# Patient Record
Sex: Male | Born: 1950 | Race: White | Hispanic: No | Marital: Married | Smoking: Never smoker
Health system: Southern US, Community
[De-identification: ages and names within clinical notes are randomized; demographics above are authoritative.]

## PROBLEM LIST (undated history)

## (undated) DIAGNOSIS — Z87442 Personal history of urinary calculi: Secondary | ICD-10-CM

## (undated) DIAGNOSIS — Z9289 Personal history of other medical treatment: Secondary | ICD-10-CM

## (undated) DIAGNOSIS — F419 Anxiety disorder, unspecified: Secondary | ICD-10-CM

## (undated) DIAGNOSIS — K2970 Gastritis, unspecified, without bleeding: Secondary | ICD-10-CM

## (undated) DIAGNOSIS — E785 Hyperlipidemia, unspecified: Secondary | ICD-10-CM

## (undated) DIAGNOSIS — I10 Essential (primary) hypertension: Secondary | ICD-10-CM

## (undated) DIAGNOSIS — K922 Gastrointestinal hemorrhage, unspecified: Secondary | ICD-10-CM

## (undated) DIAGNOSIS — K5732 Diverticulitis of large intestine without perforation or abscess without bleeding: Secondary | ICD-10-CM

## (undated) DIAGNOSIS — K219 Gastro-esophageal reflux disease without esophagitis: Secondary | ICD-10-CM

## (undated) DIAGNOSIS — Z8719 Personal history of other diseases of the digestive system: Secondary | ICD-10-CM

## (undated) DIAGNOSIS — N400 Enlarged prostate without lower urinary tract symptoms: Secondary | ICD-10-CM

## (undated) DIAGNOSIS — C4359 Malignant melanoma of other part of trunk: Secondary | ICD-10-CM

## (undated) DIAGNOSIS — D649 Anemia, unspecified: Secondary | ICD-10-CM

## (undated) DIAGNOSIS — T39395A Adverse effect of other nonsteroidal anti-inflammatory drugs [NSAID], initial encounter: Secondary | ICD-10-CM

## (undated) HISTORY — PX: COLON SURGERY: SHX602

## (undated) HISTORY — DX: Hyperlipidemia, unspecified: E78.5

## (undated) HISTORY — DX: Anemia, unspecified: D64.9

## (undated) HISTORY — PX: HEMORRHOID BANDING: SHX5850

## (undated) HISTORY — PX: HERNIA REPAIR: SHX51

---

## 1969-03-29 HISTORY — PX: KNEE CARTILAGE SURGERY: SHX688

## 1999-03-30 HISTORY — PX: MELANOMA EXCISION: SHX5266

## 2000-11-25 ENCOUNTER — Ambulatory Visit (HOSPITAL_COMMUNITY): Admission: RE | Admit: 2000-11-25 | Discharge: 2000-11-25 | Payer: Self-pay | Admitting: Oncology

## 2000-11-25 ENCOUNTER — Encounter: Payer: Self-pay | Admitting: Oncology

## 2001-09-25 ENCOUNTER — Ambulatory Visit (HOSPITAL_COMMUNITY): Admission: RE | Admit: 2001-09-25 | Discharge: 2001-09-25 | Payer: Self-pay | Admitting: Oncology

## 2001-09-25 ENCOUNTER — Encounter: Payer: Self-pay | Admitting: Oncology

## 2002-10-23 ENCOUNTER — Encounter: Payer: Self-pay | Admitting: Oncology

## 2002-10-23 ENCOUNTER — Ambulatory Visit (HOSPITAL_COMMUNITY): Admission: RE | Admit: 2002-10-23 | Discharge: 2002-10-23 | Payer: Self-pay | Admitting: Oncology

## 2004-01-29 ENCOUNTER — Encounter: Admission: RE | Admit: 2004-01-29 | Discharge: 2004-01-29 | Payer: Self-pay | Admitting: Family Medicine

## 2004-03-03 ENCOUNTER — Ambulatory Visit: Payer: Self-pay | Admitting: Oncology

## 2004-09-02 ENCOUNTER — Ambulatory Visit: Payer: Self-pay | Admitting: Oncology

## 2005-03-01 ENCOUNTER — Ambulatory Visit (HOSPITAL_COMMUNITY): Admission: RE | Admit: 2005-03-01 | Discharge: 2005-03-01 | Payer: Self-pay | Admitting: Oncology

## 2005-03-03 ENCOUNTER — Ambulatory Visit: Payer: Self-pay | Admitting: Oncology

## 2005-03-09 ENCOUNTER — Ambulatory Visit (HOSPITAL_COMMUNITY): Admission: RE | Admit: 2005-03-09 | Discharge: 2005-03-09 | Payer: Self-pay | Admitting: Oncology

## 2005-04-26 ENCOUNTER — Ambulatory Visit: Payer: Self-pay | Admitting: Oncology

## 2005-07-23 ENCOUNTER — Ambulatory Visit: Payer: Self-pay | Admitting: Oncology

## 2005-10-11 ENCOUNTER — Ambulatory Visit: Payer: Self-pay | Admitting: Oncology

## 2007-03-30 HISTORY — PX: TUMOR EXCISION: SHX421

## 2008-02-21 ENCOUNTER — Ambulatory Visit: Payer: Self-pay | Admitting: Oncology

## 2008-03-12 ENCOUNTER — Ambulatory Visit (HOSPITAL_COMMUNITY): Admission: RE | Admit: 2008-03-12 | Discharge: 2008-03-12 | Payer: Self-pay | Admitting: Oncology

## 2008-05-22 ENCOUNTER — Ambulatory Visit: Payer: Self-pay | Admitting: Oncology

## 2009-06-21 ENCOUNTER — Observation Stay (HOSPITAL_COMMUNITY): Admission: EM | Admit: 2009-06-21 | Discharge: 2009-06-22 | Payer: Self-pay | Admitting: Emergency Medicine

## 2009-06-22 ENCOUNTER — Encounter (INDEPENDENT_AMBULATORY_CARE_PROVIDER_SITE_OTHER): Payer: Self-pay | Admitting: Internal Medicine

## 2009-12-03 ENCOUNTER — Emergency Department (HOSPITAL_COMMUNITY): Admission: EM | Admit: 2009-12-03 | Discharge: 2009-12-03 | Payer: Self-pay | Admitting: Emergency Medicine

## 2010-03-18 ENCOUNTER — Ambulatory Visit: Payer: Self-pay | Admitting: Oncology

## 2010-04-17 ENCOUNTER — Ambulatory Visit: Payer: Self-pay | Admitting: Oncology

## 2010-06-11 LAB — POCT URINALYSIS DIPSTICK
Bilirubin Urine: NEGATIVE
Glucose, UA: NEGATIVE mg/dL
Ketones, ur: NEGATIVE mg/dL
Protein, ur: NEGATIVE mg/dL
Specific Gravity, Urine: 1.025 (ref 1.005–1.030)
Urobilinogen, UA: 0.2 mg/dL (ref 0.0–1.0)

## 2010-06-22 LAB — CBC
HCT: 26.8 % — ABNORMAL LOW (ref 39.0–52.0)
Hemoglobin: 8.9 g/dL — ABNORMAL LOW (ref 13.0–17.0)
MCHC: 31.2 g/dL (ref 30.0–36.0)
MCHC: 32.2 g/dL (ref 30.0–36.0)
MCV: 69.2 fL — ABNORMAL LOW (ref 78.0–100.0)
MCV: 72.8 fL — ABNORMAL LOW (ref 78.0–100.0)
Platelets: 201 10*3/uL (ref 150–400)
Platelets: 276 10*3/uL (ref 150–400)
RBC: 3.81 MIL/uL — ABNORMAL LOW (ref 4.22–5.81)
RBC: 3.88 MIL/uL — ABNORMAL LOW (ref 4.22–5.81)
RDW: 18.5 % — ABNORMAL HIGH (ref 11.5–15.5)
RDW: 20.7 % — ABNORMAL HIGH (ref 11.5–15.5)
WBC: 6.3 10*3/uL (ref 4.0–10.5)

## 2010-06-22 LAB — BASIC METABOLIC PANEL
CO2: 26 mEq/L (ref 19–32)
Calcium: 8 mg/dL — ABNORMAL LOW (ref 8.4–10.5)
Chloride: 109 mEq/L (ref 96–112)
Creatinine, Ser: 1.08 mg/dL (ref 0.4–1.5)
GFR calc Af Amer: >60 mL/min (ref >60)
GFR calc non Af Amer: >60 mL/min (ref >60)
Glucose, Bld: 109 mg/dL — ABNORMAL HIGH (ref 70–99)
Potassium: 4.2 mEq/L (ref 3.5–5.1)
Sodium: 139 mEq/L (ref 135–145)

## 2010-06-22 LAB — IRON AND TIBC
TIBC: 598 ug/dL — ABNORMAL HIGH (ref 215–435)
UIBC: 580 ug/dL

## 2010-06-22 LAB — POCT I-STAT, CHEM 8
BUN: 24 mg/dL — ABNORMAL HIGH (ref 6–23)
Calcium, Ion: 1.15 mmol/L (ref 1.12–1.32)
Chloride: 107 mEq/L (ref 96–112)
Creatinine, Ser: 1.1 mg/dL (ref 0.4–1.5)
Glucose, Bld: 98 mg/dL (ref 70–99)
Hemoglobin: 9.5 g/dL — ABNORMAL LOW (ref 13.0–17.0)
Potassium: 4.2 mEq/L (ref 3.5–5.1)
Sodium: 140 mEq/L (ref 135–145)
TCO2: 26 mmol/L (ref 0–100)

## 2010-06-22 LAB — CROSSMATCH
ABO/RH(D): A POS
Antibody Screen: NEGATIVE

## 2010-06-22 LAB — CK TOTAL AND CKMB (NOT AT ARMC)
CK, MB: 1.4 ng/mL (ref 0.3–4.0)
Total CK: 252 U/L — ABNORMAL HIGH (ref 7–232)

## 2010-06-22 LAB — POCT CARDIAC MARKERS
CKMB, poc: <1 ng/mL — ABNORMAL LOW (ref 1.0–8.0)
Myoglobin, poc: 70.6 ng/mL (ref 12–200)
Troponin i, poc: <0.05 ng/mL (ref 0.00–0.09)

## 2010-06-22 LAB — CARDIAC PANEL(CRET KIN+CKTOT+MB+TROPI)
CK, MB: 1.1 ng/mL (ref 0.3–4.0)
Relative Index: 0.5 (ref 0.0–2.5)
Relative Index: 0.6 (ref 0.0–2.5)
Troponin I: <0.01 ng/mL (ref 0.00–0.06)
Troponin I: <0.01 ng/mL (ref 0.00–0.06)

## 2010-06-22 LAB — FERRITIN: Ferritin: 3 ng/mL — ABNORMAL LOW (ref 22–322)

## 2010-06-22 LAB — APTT: aPTT: 27 seconds (ref 24–37)

## 2010-06-22 LAB — MAGNESIUM: Magnesium: 2 mg/dL (ref 1.5–2.5)

## 2010-06-22 LAB — HEMOGLOBIN AND HEMATOCRIT, BLOOD
HCT: 30.5 % — ABNORMAL LOW (ref 39.0–52.0)
Hemoglobin: 9.8 g/dL — ABNORMAL LOW (ref 13.0–17.0)

## 2010-06-22 LAB — PROTIME-INR
INR: 1.03 (ref 0.00–1.49)
Prothrombin Time: 13.4 seconds (ref 11.6–15.2)

## 2010-06-22 LAB — TROPONIN I: Troponin I: 0.01 ng/mL (ref 0.00–0.06)

## 2010-06-22 LAB — ABO/RH: ABO/RH(D): A POS

## 2010-08-10 ENCOUNTER — Encounter (INDEPENDENT_AMBULATORY_CARE_PROVIDER_SITE_OTHER): Payer: Self-pay | Admitting: General Surgery

## 2011-03-29 ENCOUNTER — Telehealth: Payer: Self-pay | Admitting: Oncology

## 2011-03-29 NOTE — Telephone Encounter (Signed)
called pt and informed him of appt on 05/07/2011

## 2011-05-07 ENCOUNTER — Ambulatory Visit (HOSPITAL_BASED_OUTPATIENT_CLINIC_OR_DEPARTMENT_OTHER): Payer: BC Managed Care – PPO | Admitting: Oncology

## 2011-05-07 ENCOUNTER — Telehealth: Payer: Self-pay | Admitting: Oncology

## 2011-05-07 VITALS — BP 135/95 | HR 82 | Temp 98.7°F | Wt 176.3 lb

## 2011-05-07 DIAGNOSIS — C4359 Malignant melanoma of other part of trunk: Secondary | ICD-10-CM

## 2011-05-07 DIAGNOSIS — C439 Malignant melanoma of skin, unspecified: Secondary | ICD-10-CM

## 2011-05-07 NOTE — Progress Notes (Signed)
OFFICE PROGRESS NOTE   INTERVAL HISTORY:   He returns as scheduled. He feels well. He reports intermittent discomfort at the lower back and left buttock area.  Objective:  Vital signs in last 24 hours:  Blood pressure 135/95, pulse 82, temperature 98.7 F (37.1 C), temperature source Oral, weight 176 lb 4.8 oz (79.969 kg).    HEENT: Neck without mass Lymphatics: No cervical, supraclavicular, axillary, or inguinal nodes Resp: Lungs clear bilaterally Cardio: Regular rate and rhythm GI: No hepatomegaly Vascular: No leg edema  Skin: Left upper back scar without evidence of recurrent tumor. Multiple benign appearing moles over the trunk.       Medications: I have reviewed the patient's current medications.  Assessment/Plan: 1. 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 2. Excision of a lipoma near the surgical scar at the left upper back in July 2007. 3.  Diagnosis of "anemia "in 2011, status post an evaluation by Drs.Miguel [name] and Miguel [name]  Disposition:  He remains in clinical remission from the melanoma. He will schedule a dermatology followup with Dr. Margo [name]. Miguel [name] would like to continue followup at the cancer Center. He will return for an office visit in one year.   Miguel Shutters, MD  05/07/2011  9:46 PM

## 2011-05-07 NOTE — Telephone Encounter (Signed)
called pt and scheduled appt for 05/05/2012

## 2012-05-05 ENCOUNTER — Telehealth: Payer: Self-pay | Admitting: Oncology

## 2012-05-05 ENCOUNTER — Ambulatory Visit (HOSPITAL_BASED_OUTPATIENT_CLINIC_OR_DEPARTMENT_OTHER): Payer: 59 | Admitting: Oncology

## 2012-05-05 VITALS — BP 152/93 | HR 92 | Temp 98.1°F | Resp 20 | Wt 166.3 lb

## 2012-05-05 DIAGNOSIS — C4359 Malignant melanoma of other part of trunk: Secondary | ICD-10-CM

## 2012-05-05 DIAGNOSIS — C439 Malignant melanoma of skin, unspecified: Secondary | ICD-10-CM

## 2012-05-05 NOTE — Telephone Encounter (Signed)
gv and printed appt schedule for pt for Jan 2015 °

## 2012-05-05 NOTE — Progress Notes (Signed)
   [facility]    OFFICE PROGRESS NOTE   INTERVAL HISTORY:   He returns as scheduled. He complains of pain and stiffness in the hands. He states the hands are difficult to open in the mornings. He reports an intentional weight loss with a change in his diet. He has "sweats "with moisture on his T-shirt several nights per week. Miguel [name] has not seen the dermatologist in the past year.  Objective:  Vital signs in last 24 hours:  Blood pressure 152/93, pulse 92, temperature 98.1 F (36.7 C), temperature source Oral, resp. rate 20, weight 166 lb 4.8 oz (75.433 kg).    HEENT: Neck without mass Lymphatics: No cervical, supraclavicular, axillary, or inguinal nodes Resp: Lungs clear bilateral Cardio: Regular rate and rhythm GI: No hepatosplenomegaly Vascular: No leg edema  Skin: Left upper back scar without evidence of recurrent tumor. Multiple benign appearing moles over the trunk Musculoskeletal: Arthritic changes at the hand joints bilaterally    Medications: I have reviewed the patient's current medications.  Assessment/Plan: 1. 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 2. Excision of a lipoma near the surgical scar at the left upper back in July 2007.       3.   Diagnosis of "anemia "in 2011, status post an evaluation by Drs.Buccini and Mitchell        4.   weight loss-intentional      Disposition:  He remains in clinical remission from melanoma. I recommended he schedule a followup appointment with Dr. Margo Aye. The discomfort/stiffness at the hands is likely related to arthritis. He will followup with Dr. Clovis Riley for hypertension and if the "sweats "progress.  Miguel [name] would like to continue followup at the cancer Center. He will return for an office visit in one year.   Miguel Papas, MD  05/05/2012  11:10 AM

## 2013-04-16 ENCOUNTER — Telehealth: Payer: Self-pay | Admitting: Oncology

## 2013-04-16 NOTE — Telephone Encounter (Signed)
s.w. pt and advised on 2.9 appt moved to 2.24 per MD request on [phone] pof

## 2013-05-07 ENCOUNTER — Ambulatory Visit: Payer: 59 | Admitting: Oncology

## 2013-05-22 ENCOUNTER — Ambulatory Visit: Payer: 59 | Admitting: Oncology

## 2013-11-03 ENCOUNTER — Encounter (HOSPITAL_COMMUNITY): Payer: Self-pay | Admitting: Emergency Medicine

## 2013-11-03 ENCOUNTER — Emergency Department (HOSPITAL_COMMUNITY): Payer: 59

## 2013-11-03 ENCOUNTER — Emergency Department (HOSPITAL_COMMUNITY)
Admission: EM | Admit: 2013-11-03 | Discharge: 2013-11-03 | Disposition: A | Discharge status: Home / Self Care | Payer: 59 | Attending: Emergency Medicine | Admitting: Emergency Medicine

## 2013-11-03 DIAGNOSIS — Z79899 Other long term (current) drug therapy: Secondary | ICD-10-CM | POA: Insufficient documentation

## 2013-11-03 DIAGNOSIS — E785 Hyperlipidemia, unspecified: Secondary | ICD-10-CM | POA: Insufficient documentation

## 2013-11-03 DIAGNOSIS — R109 Unspecified abdominal pain: Secondary | ICD-10-CM | POA: Insufficient documentation

## 2013-11-03 DIAGNOSIS — Z85820 Personal history of malignant melanoma of skin: Secondary | ICD-10-CM | POA: Insufficient documentation

## 2013-11-03 DIAGNOSIS — N189 Chronic kidney disease, unspecified: Secondary | ICD-10-CM | POA: Insufficient documentation

## 2013-11-03 DIAGNOSIS — D649 Anemia, unspecified: Secondary | ICD-10-CM | POA: Insufficient documentation

## 2013-11-03 DIAGNOSIS — N200 Calculus of kidney: Secondary | ICD-10-CM | POA: Insufficient documentation

## 2013-11-03 DIAGNOSIS — Z7982 Long term (current) use of aspirin: Secondary | ICD-10-CM | POA: Insufficient documentation

## 2013-11-03 LAB — BASIC METABOLIC PANEL
Anion gap: 13 (ref 5–15)
BUN: 16 mg/dL (ref 6–23)
CHLORIDE: 106 meq/L (ref 96–112)
CO2: 24 meq/L (ref 19–32)
Calcium: 8.2 mg/dL — ABNORMAL LOW (ref 8.4–10.5)
Creatinine, Ser: 1.02 mg/dL (ref 0.50–1.35)
GFR calc Af Amer: 88 mL/min — ABNORMAL LOW (ref >90)
GFR, EST NON AFRICAN AMERICAN: 76 mL/min — AB (ref >90)
GLUCOSE: 86 mg/dL (ref 70–99)
POTASSIUM: 3.9 meq/L (ref 3.7–5.3)
Sodium: 143 mEq/L (ref 137–147)

## 2013-11-03 LAB — URINALYSIS, ROUTINE W REFLEX MICROSCOPIC
BILIRUBIN URINE: NEGATIVE
Glucose, UA: NEGATIVE mg/dL
Hgb urine dipstick: NEGATIVE
KETONES UR: NEGATIVE mg/dL
Leukocytes, UA: NEGATIVE
Nitrite: NEGATIVE
PH: 6 (ref 5.0–8.0)
PROTEIN: NEGATIVE mg/dL
Specific Gravity, Urine: 1.008 (ref 1.005–1.030)
Urobilinogen, UA: 0.2 mg/dL (ref 0.0–1.0)

## 2013-11-03 LAB — CBC WITH DIFFERENTIAL/PLATELET
Basophils Absolute: 0 10*3/uL (ref 0.0–0.1)
Basophils Relative: 0 % (ref 0–1)
Eosinophils Absolute: 0.5 10*3/uL (ref 0.0–0.7)
Eosinophils Relative: 7 % — ABNORMAL HIGH (ref 0–5)
HCT: 36.6 % — ABNORMAL LOW (ref 39.0–52.0)
HEMOGLOBIN: 12.4 g/dL — AB (ref 13.0–17.0)
LYMPHS ABS: 1.6 10*3/uL (ref 0.7–4.0)
LYMPHS PCT: 23 % (ref 12–46)
MCH: 30.8 pg (ref 26.0–34.0)
MCHC: 33.9 g/dL (ref 30.0–36.0)
MCV: 91 fL (ref 78.0–100.0)
MONO ABS: 0.7 10*3/uL (ref 0.1–1.0)
Monocytes Relative: 9 % (ref 3–12)
Neutro Abs: 4.5 10*3/uL (ref 1.7–7.7)
Neutrophils Relative %: 61 % (ref 43–77)
Platelets: 202 10*3/uL (ref 150–400)
RBC: 4.02 MIL/uL — AB (ref 4.22–5.81)
RDW: 12.9 % (ref 11.5–15.5)
WBC: 7.3 10*3/uL (ref 4.0–10.5)

## 2013-11-03 MED ORDER — IOHEXOL 350 MG/ML SOLN
100.0000 mL | Freq: Once | INTRAVENOUS | Status: AC | PRN
  Administered 2013-11-03: 100 mL via INTRAVENOUS

## 2013-11-03 MED ORDER — SODIUM CHLORIDE 0.9 % IV BOLUS (SEPSIS)
500.0000 mL | Freq: Once | INTRAVENOUS | Status: AC
  Administered 2013-11-03: 500 mL via INTRAVENOUS

## 2013-11-03 MED ORDER — MORPHINE SULFATE 4 MG/ML IJ SOLN
6.0000 mg | Freq: Once | INTRAMUSCULAR | Status: AC
Start: 2013-11-03 — End: 2013-11-03
  Administered 2013-11-03: 6 mg via INTRAVENOUS
  Filled 2013-11-03: qty 2

## 2013-11-03 MED ORDER — HYDROCODONE-ACETAMINOPHEN 5-325 MG PO TABS: 1.0000 | ORAL_TABLET | ORAL | Status: DC | PRN

## 2013-11-03 NOTE — Discharge Instructions (Signed)
If you were given medicines take as directed.  If you are on coumadin or contraceptives realize their levels and effectiveness is altered by many different medicines.  If you have any reaction (rash, tongues swelling, other) to the medicines stop taking and see a physician.   Please follow up as directed and return to the ER or see a physician for new or worsening symptoms.  Thank you. Filed Vitals:   11/03/13 0617 11/03/13 0730 11/03/13 0916 11/03/13 0917  BP: 169/116 134/90 126/90 126/90  Pulse: 84 66  62  Temp: 97.7 F (36.5 C)     TempSrc: Oral     Resp: 18   18  SpO2: 98% 97%  97%

## 2013-11-03 NOTE — ED Provider Notes (Signed)
CSN: [id number]     Arrival date & time 11/03/13  9622 History   First MD Initiated Contact with Patient 11/03/13 0703     Chief Complaint  Patient presents with  . Flank Pain     (Consider location/radiation/quality/duration/timing/severity/associated sxs/prior Treatment) HPI Comments: 63 year old male with history of kidney stones, melanoma to the left upper back excised in the past with mild lymph node extension, chronic kidney disease, lipids presents with left flank discomfort gradually worsening for the past week. Overall similar previous multiple kidney stone history however he has not had a kidney stone for years now. No blood in the urine or urinary symptoms. No fevers chills or vomiting.  Patient is a 63 y.o. male presenting with flank pain. The history is provided by the patient.  Flank Pain This is a recurrent problem. Pertinent negatives include no chest pain, no abdominal pain, no headaches and no shortness of breath.    Past Medical History  Diagnosis Date  . Anemia   . Cancer   . Chronic kidney disease     KIDNEY STONE  . Hyperlipidemia    Past Surgical History  Procedure Laterality Date  . Knee surgery  1971  . Skin cancer removed  2001/2009   Family History  Problem Relation Age of Onset  . Stroke Mother   . Stroke Brother   . Heart disease Brother    History  Substance Use Topics  . Smoking status: Never Smoker   . Smokeless tobacco: Not on file  . Alcohol Use: No    Review of Systems  Constitutional: Positive for appetite change. Negative for fever and chills.  HENT: Negative for congestion.   Eyes: Negative for visual disturbance.  Respiratory: Negative for shortness of breath.   Cardiovascular: Negative for chest pain.  Gastrointestinal: Negative for vomiting and abdominal pain.  Genitourinary: Positive for flank pain. Negative for dysuria.  Musculoskeletal: Negative for back pain, neck pain and neck stiffness.  Skin: Negative for rash.   Neurological: Negative for light-headedness and headaches.      Allergies  Review of patient's allergies indicates no known allergies.  Home Medications   Prior to Admission medications   Medication Sig Start Date End Date Taking? Authorizing Provider  acetaminophen (TYLENOL) 500 MG tablet Take 1,000 mg by mouth every 6 (six) hours as needed (pain).   Yes Historical Provider, MD  aspirin EC 81 MG tablet Take 81 mg by mouth daily.   Yes Historical Provider, MD  celecoxib (CELEBREX) 100 MG capsule Take 100 mg by mouth 2 (two) times daily as needed.   Yes Historical Provider, MD  Multiple Vitamins-Minerals (CENTRUM SILVER PO) Take by mouth.     Yes Historical Provider, MD  Omega-3 Fatty Acids (FISH OIL) 1200 MG CAPS Take by mouth.     Yes Historical Provider, MD  Red Yeast Rice Extract (RED YEAST RICE PO) Take 2 tablets by mouth 2 (two) times daily.   Yes Historical Provider, MD  zolpidem (AMBIEN) 10 MG tablet Take 10 mg by mouth at bedtime as needed.     Yes Historical Provider, MD   BP 169/116  Pulse 84  Temp(Src) 97.7 F (36.5 C) (Oral)  Resp 18  SpO2 98% Physical Exam  Nursing note and vitals reviewed. Constitutional: He is oriented to person, place, and time. He appears well-developed and well-nourished.  HENT:  Head: Normocephalic and atraumatic.  Mild dry mucous membranes  Eyes: Conjunctivae are normal. Right eye exhibits no discharge. Left eye exhibits no  discharge.  Neck: Normal range of motion. Neck supple. No tracheal deviation present.  Cardiovascular: Normal rate and regular rhythm.   Pulmonary/Chest: Effort normal and breath sounds normal.  Abdominal: Soft. He exhibits no distension. There is no tenderness. There is no guarding.  Musculoskeletal: He exhibits tenderness. He exhibits no edema.  Mild tenderness left mid and lower flank posteriorly, no rash or signs of melanoma externally that I can visualize.  Neurological: He is alert and oriented to person, place,  and time.  Skin: Skin is warm. No rash noted.  Psychiatric: He has a normal mood and affect.    ED Course  Procedures (including critical care time) Emergency Focused Ultrasound Exam Limited retroperitoneal ultrasound of kidneys  Performed and interpreted by Dr. Reather Converse Indication: flank pain Focused abdominal ultrasound with both kidneys imaged in transverse and longitudinal planes in real-time. Interpretation: no hydronephrosis visualized.   Images archived electronically  Labs Review Labs Reviewed  BASIC METABOLIC PANEL - Abnormal; Notable for the following:    Calcium 8.2 (*)    GFR calc non Af Amer 76 (*)    GFR calc Af Amer 88 (*)    All other components within normal limits  CBC WITH DIFFERENTIAL - Abnormal; Notable for the following:    RBC 4.02 (*)    Hemoglobin 12.4 (*)    HCT 36.6 (*)    Eosinophils Relative 7 (*)    All other components within normal limits  URINALYSIS, ROUTINE W REFLEX MICROSCOPIC    Imaging Review Ct Angio Chest Aortic Dissect W &/or W/o  11/03/2013   CLINICAL DATA:  Left-sided flank pain.  Unremarkable unenhanced CT.  EXAM: CT ANGIOGRAPHY CHEST, ABDOMEN AND PELVIS  TECHNIQUE: Multidetector CT imaging through the chest, abdomen and pelvis was performed using the standard protocol during bolus administration of intravenous contrast. Multiplanar reconstructed images and MIPs were obtained and reviewed to evaluate the vascular anatomy.  CONTRAST:  160mL OMNIPAQUE IOHEXOL 350 MG/ML SOLN  COMPARISON:  CT of the abdomen and pelvis without contrast earlier today as well as prior contrast enhanced CT's of the chest, abdomen and pelvis on 03/09/2005.  FINDINGS: CTA CHEST FINDINGS  The thoracic aorta is of normal caliber and shows normal patency. Proximal great vessels are widely patent. No evidence of aortic dissection. Pulmonary arteries are also well opacified and show normal patency.  Large hiatal hernia present. No evidence of masses or enlarged lymph nodes.  Lung windows show no evidence of infiltrates or pulmonary nodules. No pleural or pericardial fluid. The heart size is normal. No pneumothorax.  Review of the MIP images confirms the above findings.  CTA ABDOMEN AND PELVIS FINDINGS  The abdominal aorta shows normal patency and no evidence of aneurysm or dissection. Mild narrowing of the proximal celiac axis in a configuration consistent with mild median arcuate ligament compression. The superior and inferior mesenteric arteries are normally patent. Two separate right renal arteries and a single left renal artery show normal patency. No distal renal artery branch pathology is identified.  Bilateral iliac and common femoral arteries show normal patency.  Nonvascular evaluation shows no arterial phase solid organ abnormalities. No masses or enlarged lymph nodes are seen. Bowel loops are unremarkable. There is a small umbilical hernia containing fat. No abnormal fluid collections.  The prostate gland is moderately enlarged. Degenerative disc disease present at L5-S1. No bony lesions.  Review of the MIP images confirms the above findings.  IMPRESSION: No evidence of aortic or branch vessel pathology in the chest, abdomen  or pelvis.   Electronically Signed   By: Aletta Edouard M.D.   On: 11/03/2013 10:55   Ct Renal Stone Study  11/03/2013   CLINICAL DATA:  Left-sided flank pain and history of renal calculi.  EXAM: CT RENAL STONE PROTOCOL  TECHNIQUE: Multidetector CT imaging of the abdomen and pelvis was performed following the standard protocol without intravenous contrast  COMPARISON:  03/09/2005  FINDINGS: Stable large hiatal hernia. 5 mm nonobstructing calculus is identified in the lower pole collecting system of the left kidney. No left-sided ureteral calculi are identified.  Tiny interpolar and lower pole calculi of the right kidney are identified in the 1-2 mm diameter range. These are not causing obstruction. As the ureter is followed, there is suggestion of a 2  mm calculus near the right ureterovesical junction which may actually be within the bladder. Additional calcification just posterior to the right side of the bladder measures roughly 7 mm in greatest diameter and appears to represent 2 adjacent calcifications in the coronal projection. The coronal projection suggests that this represents a distal ureterocele/ureteral diverticulum with focal dilatation of the distal ureter containing calculi.  Unenhanced appearance of the liver, gallbladder, pancreas, spleen, adrenal glands and bowel are unremarkable. There is diffuse diverticulosis of most of the colon. No acute inflammatory process, abnormal fluid collection, mass or enlarged lymph nodes are seen. No evidence of hernia. Bony structures show mild spondylosis of the lumbar spine.  IMPRESSION: No hydronephrosis. Small nonobstructing calculus is present in the lower pole collecting system of the left kidney. Tiny nonobstructing calculi are present in the right kidney. In addition, there is focal dilatation of the distal right ureter near the ureterovesical junction representing ureterocele/ureteral diverticulum containing 2 adjacent small calculi. Additional small calculus near the right ureterovesical junction may be within the bladder lumen.   Electronically Signed   By: Aletta Edouard M.D.   On: 11/03/2013 08:15   Ct Cta Abd/pel W/cm &/or W/o Cm  11/03/2013   CLINICAL DATA:  Left-sided flank pain.  Unremarkable unenhanced CT.  EXAM: CT ANGIOGRAPHY CHEST, ABDOMEN AND PELVIS  TECHNIQUE: Multidetector CT imaging through the chest, abdomen and pelvis was performed using the standard protocol during bolus administration of intravenous contrast. Multiplanar reconstructed images and MIPs were obtained and reviewed to evaluate the vascular anatomy.  CONTRAST:  136mL OMNIPAQUE IOHEXOL 350 MG/ML SOLN  COMPARISON:  CT of the abdomen and pelvis without contrast earlier today as well as prior contrast enhanced CT's of the  chest, abdomen and pelvis on 03/09/2005.  FINDINGS: CTA CHEST FINDINGS  The thoracic aorta is of normal caliber and shows normal patency. Proximal great vessels are widely patent. No evidence of aortic dissection. Pulmonary arteries are also well opacified and show normal patency.  Large hiatal hernia present. No evidence of masses or enlarged lymph nodes. Lung windows show no evidence of infiltrates or pulmonary nodules. No pleural or pericardial fluid. The heart size is normal. No pneumothorax.  Review of the MIP images confirms the above findings.  CTA ABDOMEN AND PELVIS FINDINGS  The abdominal aorta shows normal patency and no evidence of aneurysm or dissection. Mild narrowing of the proximal celiac axis in a configuration consistent with mild median arcuate ligament compression. The superior and inferior mesenteric arteries are normally patent. Two separate right renal arteries and a single left renal artery show normal patency. No distal renal artery branch pathology is identified.  Bilateral iliac and common femoral arteries show normal patency.  Nonvascular evaluation shows no arterial  phase solid organ abnormalities. No masses or enlarged lymph nodes are seen. Bowel loops are unremarkable. There is a small umbilical hernia containing fat. No abnormal fluid collections.  The prostate gland is moderately enlarged. Degenerative disc disease present at L5-S1. No bony lesions.  Review of the MIP images confirms the above findings.  IMPRESSION: No evidence of aortic or branch vessel pathology in the chest, abdomen or pelvis.   Electronically Signed   By: Aletta Edouard M.D.   On: 11/03/2013 10:55     EKG Interpretation None      MDM   Final diagnoses:  Acute left flank pain  Nephrolithiasis   Patient presents with clinically likely kidney stone. Discussed other differential especially with his history of melanoma. At that ultrasound no significant hydronephrosis, or hematuria however patient has  had stones in the past without hematuria per him. Plan for CT scan without contrast to start if kidney stone then diagnosis is made, if no stone seen we will have to discuss further evaluation with possible contrast. Pain medicines given.  Patient's pain improved and controlled on recheck. CT scan without contrast showed no acute findings, kidney stone seen however not suspecting that because in the amount of pain patient was in. CT angina chest abdomen pelvis ordered to look for other cause of left flank pain including signs of cancer or or dissection. CT with contrast reviewed results in no acute findings. Patient pain control followup with urology discussed.  Results and differential diagnosis were discussed with the patient/parent/guardian. Close follow up outpatient was discussed, comfortable with the plan.   Medications  morphine 4 MG/ML injection 6 mg (6 mg Intravenous Given 11/03/13 0726)  sodium chloride 0.9 % bolus 500 mL (0 mLs Intravenous Stopped 11/03/13 0921)  iohexol (OMNIPAQUE) 350 MG/ML injection 100 mL (100 mLs Intravenous Contrast Given 11/03/13 1017)    Filed Vitals:   11/03/13 0617 11/03/13 0730 11/03/13 0916 11/03/13 0917  BP: 169/116 134/90 126/90 126/90  Pulse: 84 66  62  Temp: 97.7 F (36.5 C)     TempSrc: Oral     Resp: 18   18  SpO2: 98% 97%  97%        Mariea Clonts, MD 11/03/13 1127

## 2013-11-03 NOTE — ED Notes (Signed)
The pt has had lt flank pain for one week.  The pain has been worse all night.  No bloody urine no difficulty voiding

## 2013-11-03 NOTE — ED Notes (Signed)
Patient transported to CT 

## 2013-11-03 NOTE — ED Notes (Signed)
Returned from ct scan 

## 2014-03-20 ENCOUNTER — Ambulatory Visit (INDEPENDENT_AMBULATORY_CARE_PROVIDER_SITE_OTHER): Payer: Self-pay | Admitting: Neurology

## 2014-03-20 ENCOUNTER — Ambulatory Visit (INDEPENDENT_AMBULATORY_CARE_PROVIDER_SITE_OTHER): Payer: 59 | Admitting: Neurology

## 2014-03-20 DIAGNOSIS — G5601 Carpal tunnel syndrome, right upper limb: Secondary | ICD-10-CM

## 2014-03-20 DIAGNOSIS — G5602 Carpal tunnel syndrome, left upper limb: Secondary | ICD-10-CM

## 2014-03-20 DIAGNOSIS — G5603 Carpal tunnel syndrome, bilateral upper limbs: Secondary | ICD-10-CM

## 2014-03-20 NOTE — Progress Notes (Signed)
  [facility]    Provider:  Dr Jaynee Eagles Referring Provider: Donnie Coffin, MD Primary Care Physician:  Donnie Coffin, MD  HPI:  Miguel [name] is a 63 y.o. male here as a referral from Dr. Alroy Dust for evaluation of hand pain. Symptoms started several years ago. He has tingling and numbness in digits 1-3 of the right >> left hand with radiation to the forearms. He wakes up in the middle of the night with numbness and tries to shake hands out. Denies weakness of grip. No neck pain or radicular symptoms. Focused exam with +Tinel's Sign at the bilateral wrists, mild bilat APB weakness.   Summary:   Nerve Conduction Studies were performed on the bilateral upper extremities.  The left Median APB motor nerve showed prolonged distal onset latency (5.2 ms, N<4.0) with normal F wave latency The left Median 2nd Digit sensory nerve showed prolonged distal peak latency (4.3 ms, N<3.9) and reduced amplitude (5.0 V, N>10)  The right Median APB motor nerve showed prolonged distal onset latency (6.8 ms, N<4.0) with normal F wave latency The right Median 2nd Digit sensory nerve showed no response  Bilateral Ulnar ADM motor nerves were within normal limits with normal F wave latencies  Bilateral Ulnar  5th digit sensory nerves were within normal limits  Bilateral radial sensory nerves were within normal limits   .   The right median/ulnar (palm) comparison nerve showed no response (Median Palm)  The left median/ulnar (palm) comparison nerve showed prolonged distal peak latency (Median Palm, 2.7 ms, N<2.2) and abnormal peak latency difference (Median Palm-Ulnar Palm, 0.9 ms, N<0.4) with a relative median delay.   EMG needle study of selected bilateral extremity muscles was performed. The following muscles were normal: Deltoid, Triceps, Pronator Teres, Opponens Pollicis, First Dorsal Interosseous, C6/C7 paraspinals.   Conclusion: This is an abnormal study. 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  No suggestion of cervical radiculopathy.  Clinical correlation recommended.   Sarina Ill, MD  [facility] [address] [place], [place] [address]  Phone [phone] Fax [phone]

## 2014-03-20 NOTE — Progress Notes (Signed)
  [facility]    Provider:  Dr Jaynee Eagles Referring Provider: Donnie Coffin, MD Primary Care Physician:  Donnie Coffin, MD  HPI:  Miguel [name] is a 63 y.o. male here as a referral from Dr. Alroy Dust for evaluation of hand pain. Symptoms started several years ago. He has tingling and numbness in digits 1-3 of the right >> left hand with radiation to the forearms. He wakes up in the middle of the night with numbness and tries to shake hands out. Denies weakness of grip. No neck pain or radicular symptoms. Focused exam with +Tinel's Sign at the bilateral wrists, mild bilat APB weakness.   Summary:   Nerve Conduction Studies were performed on the bilateral upper extremities.  The left Median APB motor nerve showed prolonged distal onset latency (5.2 ms, N<4.0) with normal F wave latency The left Median 2nd Digit sensory nerve showed prolonged distal peak latency (4.3 ms, N<3.9) and reduced amplitude (5.0 V, N>10)  The right Median APB motor nerve showed prolonged distal onset latency (6.8 ms, N<4.0) with normal F wave latency The right Median 2nd Digit sensory nerve showed no response  Bilateral Ulnar ADM motor nerves were within normal limits with normal F wave latencies  Bilateral Ulnar  5th digit sensory nerves were within normal limits  Bilateral radial sensory nerves were within normal limits   .   The right median/ulnar (palm) comparison nerve showed no response (Median Palm)  The left median/ulnar (palm) comparison nerve showed prolonged distal peak latency (Median Palm, 2.7 ms, N<2.2) and abnormal peak latency difference (Median Palm-Ulnar Palm, 0.9 ms, N<0.4) with a relative median delay.   EMG needle study of selected bilateral extremity muscles was performed. The following muscles were normal: Deltoid, Triceps, Pronator Teres, Opponens Pollicis, First Dorsal Interosseous, C6/C7 paraspinals.   Conclusion: This is an abnormal study. 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  No suggestion of cervical radiculopathy.  Clinical correlation recommended.   Sarina Ill, MD  [facility] [address] [place], [place] [address]  Phone [phone] Fax [phone]

## 2014-04-24 NOTE — Procedures (Signed)
[facility]    Provider: Dr Jaynee Eagles Referring Provider: Donnie Coffin, MD Primary Care Physician: Donnie Coffin, MD  HPI: Miguel [name] is a 64 y.o. male here as a referral from Dr. Alroy Dust for evaluation of hand pain. Symptoms started several years ago. He has tingling and numbness in digits 1-3 of the right >> left hand with radiation to the forearms. He wakes up in the middle of the night with numbness and tries to shake hands out. Denies weakness of grip. No neck pain or radicular symptoms. Focused exam with +Tinel's Sign at the bilateral wrists, mild bilat APB weakness.   Summary:   Nerve Conduction Studies were performed on the bilateral upper extremities.  The left Median APB motor nerve showed prolonged distal onset latency (5.2 ms, N<4.0) with normal F wave latency The left Median 2nd Digit sensory nerve showed prolonged distal peak latency (4.3 ms, N<3.9) and reduced amplitude (5.0 V, N>10)  The right Median APB motor nerve showed prolonged distal onset latency (6.8 ms, N<4.0) with normal F wave latency The right Median 2nd Digit sensory nerve showed no response  Bilateral Ulnar ADM motor nerves were within normal limits with normal F wave latencies  Bilateral Ulnar 5th digit sensory nerves were within normal limits  Bilateral radial sensory nerves were within normal limits  .  The right median/ulnar (palm) comparison nerve showed no response (Median Palm)  The left median/ulnar (palm) comparison nerve showed prolonged distal peak latency (Median Palm, 2.7 ms, N<2.2) and abnormal peak latency difference (Median Palm-Ulnar Palm, 0.9 ms, N<0.4) with a relative median delay.   EMG needle study of selected bilateral extremity muscles was performed. The following muscles were normal: Deltoid, Triceps, Pronator Teres, Opponens Pollicis, First Dorsal Interosseous, C6/C7 paraspinals.  Conclusion: This is an abnormal study. 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 No suggestion of cervical radiculopathy. Clinical correlation recommended.   Sarina Ill, MD  [facility] [address] [place], [place] [address]  Phone [phone] Fax [phone]

## 2014-04-24 NOTE — Addendum Note (Signed)
Addended by: Sarina Ill B on: 04/24/2014 06:17 PM   Modules accepted: Level of Service

## 2015-04-04 DIAGNOSIS — Z1211 Encounter for screening for malignant neoplasm of colon: Secondary | ICD-10-CM | POA: Diagnosis not present

## 2015-06-25 DIAGNOSIS — M25512 Pain in left shoulder: Secondary | ICD-10-CM | POA: Diagnosis not present

## 2015-07-04 ENCOUNTER — Emergency Department (HOSPITAL_BASED_OUTPATIENT_CLINIC_OR_DEPARTMENT_OTHER): Payer: 59

## 2015-07-04 ENCOUNTER — Encounter (HOSPITAL_BASED_OUTPATIENT_CLINIC_OR_DEPARTMENT_OTHER): Payer: Self-pay | Admitting: *Deleted

## 2015-07-04 ENCOUNTER — Inpatient Hospital Stay (HOSPITAL_BASED_OUTPATIENT_CLINIC_OR_DEPARTMENT_OTHER)
Admission: EM | Admit: 2015-07-04 | Discharge: 2015-07-10 | DRG: 330 | Disposition: A | Discharge status: Home / Self Care | Payer: 59 | Attending: Surgery | Admitting: Surgery

## 2015-07-04 DIAGNOSIS — K5720 Diverticulitis of large intestine with perforation and abscess without bleeding: Principal | ICD-10-CM | POA: Diagnosis present

## 2015-07-04 DIAGNOSIS — M25461 Effusion, right knee: Secondary | ICD-10-CM

## 2015-07-04 DIAGNOSIS — K567 Ileus, unspecified: Secondary | ICD-10-CM | POA: Diagnosis not present

## 2015-07-04 DIAGNOSIS — R109 Unspecified abdominal pain: Secondary | ICD-10-CM | POA: Diagnosis not present

## 2015-07-04 DIAGNOSIS — R509 Fever, unspecified: Secondary | ICD-10-CM | POA: Diagnosis not present

## 2015-07-04 DIAGNOSIS — K449 Diaphragmatic hernia without obstruction or gangrene: Secondary | ICD-10-CM | POA: Diagnosis not present

## 2015-07-04 DIAGNOSIS — N200 Calculus of kidney: Secondary | ICD-10-CM | POA: Diagnosis not present

## 2015-07-04 DIAGNOSIS — K429 Umbilical hernia without obstruction or gangrene: Secondary | ICD-10-CM | POA: Diagnosis not present

## 2015-07-04 DIAGNOSIS — I444 Left anterior fascicular block: Secondary | ICD-10-CM | POA: Diagnosis not present

## 2015-07-04 DIAGNOSIS — Z7982 Long term (current) use of aspirin: Secondary | ICD-10-CM

## 2015-07-04 LAB — COMPREHENSIVE METABOLIC PANEL WITH GFR
ALT: 25 U/L (ref 17–63)
AST: 22 U/L (ref 15–41)
Albumin: 4.2 g/dL (ref 3.5–5.0)
Alkaline Phosphatase: 59 U/L (ref 38–126)
Anion gap: 9 (ref 5–15)
BUN: 18 mg/dL (ref 6–20)
CO2: 24 mmol/L (ref 22–32)
Calcium: 8.8 mg/dL — ABNORMAL LOW (ref 8.9–10.3)
Chloride: 104 mmol/L (ref 101–111)
Creatinine, Ser: 1.06 mg/dL (ref 0.61–1.24)
GFR calc Af Amer: >60 mL/min
GFR calc non Af Amer: >60 mL/min
Glucose, Bld: 115 mg/dL — ABNORMAL HIGH (ref 65–99)
Potassium: 4.5 mmol/L (ref 3.5–5.1)
Sodium: 137 mmol/L (ref 135–145)
Total Bilirubin: 1 mg/dL (ref 0.3–1.2)
Total Protein: 7.4 g/dL (ref 6.5–8.1)

## 2015-07-04 LAB — CBC WITH DIFFERENTIAL/PLATELET
BASOS ABS: 0 10*3/uL (ref 0.0–0.1)
Basophils Relative: 0 %
Eosinophils Absolute: 0.1 10*3/uL (ref 0.0–0.7)
Eosinophils Relative: 1 %
HEMATOCRIT: 40.6 % (ref 39.0–52.0)
Hemoglobin: 13.7 g/dL (ref 13.0–17.0)
LYMPHS PCT: 9 %
Lymphs Abs: 1.3 10*3/uL (ref 0.7–4.0)
MCH: 30.4 pg (ref 26.0–34.0)
MCHC: 33.7 g/dL (ref 30.0–36.0)
MCV: 90.2 fL (ref 78.0–100.0)
Monocytes Absolute: 0.9 10*3/uL (ref 0.1–1.0)
Monocytes Relative: 6 %
NEUTROS ABS: 12.4 10*3/uL — AB (ref 1.7–7.7)
NEUTROS PCT: 84 %
Platelets: 217 10*3/uL (ref 150–400)
RBC: 4.5 MIL/uL (ref 4.22–5.81)
RDW: 13.6 % (ref 11.5–15.5)
WBC: 14.8 10*3/uL — AB (ref 4.0–10.5)

## 2015-07-04 MED ORDER — SODIUM CHLORIDE 0.9 % IV BOLUS (SEPSIS)
1000.0000 mL | Freq: Once | INTRAVENOUS | Status: AC
  Administered 2015-07-05: 1000 mL via INTRAVENOUS

## 2015-07-04 MED ORDER — IOPAMIDOL (ISOVUE-300) INJECTION 61%
100.0000 mL | Freq: Once | INTRAVENOUS | Status: AC | PRN
  Administered 2015-07-04: 100 mL via INTRAVENOUS

## 2015-07-04 MED ORDER — PIPERACILLIN-TAZOBACTAM 3.375 G IVPB 30 MIN
3.3750 g | Freq: Once | INTRAVENOUS | Status: AC
Start: 2015-07-04 — End: 2015-07-05
  Administered 2015-07-04: 3.375 g via INTRAVENOUS
  Filled 2015-07-04 (×2): qty 50

## 2015-07-04 NOTE — ED Provider Notes (Signed)
CSN: [id number]     Arrival date & time 07/04/15  2015 History   First MD Initiated Contact with Patient 07/04/15 2126     Chief Complaint  Patient presents with  . Abdominal Pain     (Consider location/radiation/quality/duration/timing/severity/associated sxs/prior Treatment) HPI Comments: Patient evaluated at [facility] today for abdominal pain and fever. Patient noted to have mild leukocytosis. Patient sent to ED for additional work-up.  Patient is a 65 y.o. male presenting with abdominal pain. The history is provided by the patient and medical records. No language interpreter was used.  Abdominal Pain Pain location:  LLQ and RLQ Pain quality: fullness and gnawing   Pain radiates to:  LUQ, RUQ and chest Pain severity:  Moderate Onset quality:  Gradual Duration:  1 day Timing:  Intermittent Progression:  Waxing and waning Chronicity:  New Associated symptoms: chills, cough, fatigue and fever   Associated symptoms: no nausea and no vomiting   Risk factors: NSAID use     Past Medical History  Diagnosis Date  . Anemia   . Cancer ([place])   . Chronic kidney disease     KIDNEY STONE  . Hyperlipidemia    Past Surgical History  Procedure Laterality Date  . Knee surgery  1971  . Skin cancer removed  2001/2009   Family History  Problem Relation Age of Onset  . Stroke Mother   . Stroke Brother   . Heart disease Brother    Social History  Substance Use Topics  . Smoking status: Never Smoker   . Smokeless tobacco: None  . Alcohol Use: No    Review of Systems  Constitutional: Positive for fever, chills and fatigue.  Respiratory: Positive for cough.   Gastrointestinal: Positive for abdominal pain. Negative for nausea and vomiting.  Musculoskeletal: Positive for myalgias.  All other systems reviewed and are negative.     Allergies  Review of patient's allergies indicates no known allergies.  Home Medications   Prior to Admission medications   Medication Sig Start  Date End Date Taking? Authorizing Provider  Meloxicam (MOBIC PO) Take by mouth.   Yes Historical Provider, MD  acetaminophen (TYLENOL) 500 MG tablet Take 1,000 mg by mouth every 6 (six) hours as needed (pain).    Historical Provider, MD  aspirin EC 81 MG tablet Take 81 mg by mouth daily.    Historical Provider, MD  celecoxib (CELEBREX) 100 MG capsule Take 100 mg by mouth 2 (two) times daily as needed.    Historical Provider, MD  HYDROcodone-acetaminophen (NORCO) 5-325 MG per tablet Take 1-2 tablets by mouth every 4 (four) hours as needed. 11/03/13   Elnora Morrison, MD  Multiple Vitamins-Minerals (CENTRUM SILVER PO) Take by mouth.      Historical Provider, MD  Omega-3 Fatty Acids (FISH OIL) 1200 MG CAPS Take by mouth.      Historical Provider, MD  Red Yeast Rice Extract (RED YEAST RICE PO) Take 2 tablets by mouth 2 (two) times daily.    Historical Provider, MD  zolpidem (AMBIEN) 10 MG tablet Take 10 mg by mouth at bedtime as needed.      Historical Provider, MD   BP 142/84 mmHg  Pulse 103  Temp(Src) 99.5 F (37.5 C) (Oral)  Resp 20  Ht 5\' 5"  (1.651 m)  Wt 78.019 kg  BMI 28.62 kg/m2  SpO2 96% Physical Exam  Constitutional: He is oriented to person, place, and time. He appears well-developed and well-nourished.  HENT:  Head: Normocephalic.  Eyes: Conjunctivae are  normal.  Neck: Neck supple.  Cardiovascular: Normal rate and regular rhythm.   Pulmonary/Chest: Effort normal and breath sounds normal.  Abdominal: Soft. There is tenderness.  Musculoskeletal: He exhibits no edema.  Lymphadenopathy:    He has no cervical adenopathy.  Neurological: He is alert and oriented to person, place, and time.  Skin: Skin is warm and dry.  Psychiatric: He has a normal mood and affect.  Nursing note and vitals reviewed.   ED Course  Procedures (including critical care time) Labs Review Labs Reviewed  CBC WITH DIFFERENTIAL/PLATELET  COMPREHENSIVE METABOLIC PANEL    Imaging Review Ct Abdomen  Pelvis W Contrast  07/04/2015  CLINICAL DATA:  Epigastric and periumbilical pain for 2 days. Febrile. Leukocytosis. EXAM: CT ABDOMEN AND PELVIS WITH CONTRAST TECHNIQUE: Multidetector CT imaging of the abdomen and pelvis was performed using the standard protocol following bolus administration of intravenous contrast. CONTRAST:  161mL ISOVUE-300 IOPAMIDOL (ISOVUE-300) INJECTION 61% COMPARISON:  11/03/2013 FINDINGS: There is a moderate volume free intraperitoneal air. This likely originates from a perforated proximal sigmoid diverticulitis. There is intense inflammatory change surrounding this portion of the sigmoid. No drainable abscess. There is mild dilatation of small bowel without caliber transition, perhaps reactive. A discrete small bowel lesion or focal small bowel inflammation is not evident. Appendix is normal. Stomach is nearly completely herniated into the chest but is unobstructed and not inflamed. The liver is remarkable only for a 10 mm hypodensity adjacent to the gallbladder fossa, most likely a benign cyst. The gallbladder and bile ducts are unremarkable. The pancreas is normal. The spleen is normal. The adrenals are normal. There is a 2.4 cm cyst of the right renal upper pole. There are 2 left lower pole collecting system calculi measuring up to 4 mm. No ureteral calculi. There are multiple small calculus fragments within the urinary bladder lumen, only about 2 mm each. Otherwise unremarkable appearances of the urinary bladder. There is prominent prostatic enlargement. The abdominal aorta is normal in caliber. There is mild atherosclerotic calcification. There is no adenopathy in the abdomen or pelvis. There is no significant abnormality in the lower chest except for the large hiatal hernia. There is no significant skeletal lesion. IMPRESSION: 1. Free intraperitoneal air. This probably originates from a perforated sigmoid diverticulitis. 2. Large hiatal hernia, containing nearly the entire stomach. No  evidence of obstruction or incarceration within the hernia. 3. Left nephrolithiasis. Several tiny calculus fragments within the urinary bladder lumen. No ureteral calculi. 4. These results were called by telephone at the time of interpretation on 07/04/2015 at 11:33 pm to Dr. Dolly Rias, who verbally acknowledged these results. Electronically Signed   By: Andreas Newport M.D.   On: 07/04/2015 23:35   I have personally reviewed and evaluated these images and lab results as part of my medical decision-making.   EKG Interpretation None       Patient discussed with and seen by Dr. Dolly Rias. Radiology results reviewed and shared with patient.  Dr. Dolly Rias spoke with surgery Hassell Done). Dr. Hassell Done requests medical admission at [facility].  Medical service consulted, and not comfortable accepting patient for admission.  Dr. Ninfa Linden, on surgical call at [facility], called and requests transfer of patient to the [facility] ED so he can be evaluated by the surgical service. I spoke with Dr. Eulis Foster, ED attending at [facility] ED, to make him aware of patient. MDM   Final diagnoses:  None  Perforated sigmoid diverticulitis with moderate amount of free air. Patient transferred to [facility] ED to be seen  by surgery upon arrival.       Etta Quill, NP 07/05/15 0225  Merrily Pew, MD 07/05/15 [id number]

## 2015-07-04 NOTE — ED Notes (Signed)
Lower abdominal yesterday. Fever today. He was seen at [facility] and had a work up.

## 2015-07-04 NOTE — ED Notes (Signed)
MD at bedside. 

## 2015-07-04 NOTE — ED Notes (Signed)
Patient transported to CT 

## 2015-07-05 ENCOUNTER — Emergency Department (HOSPITAL_COMMUNITY): Payer: 59 | Admitting: Anesthesiology

## 2015-07-05 ENCOUNTER — Encounter (HOSPITAL_COMMUNITY): Admission: EM | Disposition: A | Discharge status: Home / Self Care | Payer: Self-pay | Source: Home / Self Care

## 2015-07-05 DIAGNOSIS — K5720 Diverticulitis of large intestine with perforation and abscess without bleeding: Secondary | ICD-10-CM | POA: Diagnosis present

## 2015-07-05 DIAGNOSIS — K429 Umbilical hernia without obstruction or gangrene: Secondary | ICD-10-CM | POA: Diagnosis present

## 2015-07-05 DIAGNOSIS — M7989 Other specified soft tissue disorders: Secondary | ICD-10-CM | POA: Diagnosis not present

## 2015-07-05 DIAGNOSIS — K567 Ileus, unspecified: Secondary | ICD-10-CM | POA: Diagnosis not present

## 2015-07-05 DIAGNOSIS — K449 Diaphragmatic hernia without obstruction or gangrene: Secondary | ICD-10-CM | POA: Diagnosis present

## 2015-07-05 DIAGNOSIS — Z7982 Long term (current) use of aspirin: Secondary | ICD-10-CM | POA: Diagnosis not present

## 2015-07-05 DIAGNOSIS — I444 Left anterior fascicular block: Secondary | ICD-10-CM | POA: Diagnosis present

## 2015-07-05 HISTORY — PX: LAPAROTOMY: SHX154

## 2015-07-05 SURGERY — LAPAROTOMY, EXPLORATORY
Anesthesia: General | Site: Abdomen

## 2015-07-05 MED ORDER — FENTANYL CITRATE (PF) 250 MCG/5ML IJ SOLN
INTRAMUSCULAR | Status: DC | PRN
  Administered 2015-07-05: 50 ug via INTRAVENOUS
  Administered 2015-07-05 (×2): 100 ug via INTRAVENOUS

## 2015-07-05 MED ORDER — LACTATED RINGERS IV SOLN
INTRAVENOUS | Status: DC | PRN
  Administered 2015-07-05 (×2): via INTRAVENOUS

## 2015-07-05 MED ORDER — ONDANSETRON HCL 4 MG/2ML IJ SOLN
4.0000 mg | Freq: Four times a day (QID) | INTRAMUSCULAR | Status: DC | PRN
Start: 2015-07-05 — End: 2015-07-05

## 2015-07-05 MED ORDER — NALOXONE HCL 0.4 MG/ML IJ SOLN: 0.4000 mg | INTRAMUSCULAR | Status: DC | PRN

## 2015-07-05 MED ORDER — ONDANSETRON HCL 4 MG/2ML IJ SOLN
4.0000 mg | Freq: Four times a day (QID) | INTRAMUSCULAR | Status: DC | PRN
  Administered 2015-07-05 – 2015-07-09 (×8): 4 mg via INTRAVENOUS
  Filled 2015-07-05 (×9): qty 2

## 2015-07-05 MED ORDER — MIDAZOLAM HCL 2 MG/2ML IJ SOLN
INTRAMUSCULAR | Status: DC | PRN
Start: 2015-07-05 — End: 2015-07-05
  Administered 2015-07-05: 2 mg via INTRAVENOUS

## 2015-07-05 MED ORDER — FENTANYL CITRATE (PF) 250 MCG/5ML IJ SOLN
INTRAMUSCULAR | Status: AC
  Filled 2015-07-05: qty 5

## 2015-07-05 MED ORDER — SODIUM CHLORIDE 0.9% FLUSH: 9.0000 mL | INTRAVENOUS | Status: DC | PRN

## 2015-07-05 MED ORDER — ONDANSETRON 4 MG PO TBDP
4.0000 mg | ORAL_TABLET | Freq: Four times a day (QID) | ORAL | Status: DC | PRN
Start: 2015-07-05 — End: 2015-07-10

## 2015-07-05 MED ORDER — MIDAZOLAM HCL 2 MG/2ML IJ SOLN
INTRAMUSCULAR | Status: AC
  Filled 2015-07-05: qty 2

## 2015-07-05 MED ORDER — HYDROMORPHONE HCL 1 MG/ML IJ SOLN
INTRAMUSCULAR | Status: AC
  Filled 2015-07-05: qty 1

## 2015-07-05 MED ORDER — DIPHENHYDRAMINE HCL 12.5 MG/5ML PO ELIX: 12.5000 mg | ORAL_SOLUTION | Freq: Four times a day (QID) | ORAL | Status: DC | PRN

## 2015-07-05 MED ORDER — ONDANSETRON HCL 4 MG/2ML IJ SOLN
INTRAMUSCULAR | Status: DC | PRN
  Administered 2015-07-05: 4 mg via INTRAVENOUS

## 2015-07-05 MED ORDER — SUGAMMADEX SODIUM 200 MG/2ML IV SOLN
INTRAVENOUS | Status: DC | PRN
  Administered 2015-07-05: 200 mg via INTRAVENOUS

## 2015-07-05 MED ORDER — HYDROMORPHONE HCL 1 MG/ML IJ SOLN
0.2500 mg | INTRAMUSCULAR | Status: DC | PRN
  Administered 2015-07-05: 0.5 mg via INTRAVENOUS

## 2015-07-05 MED ORDER — 0.9 % SODIUM CHLORIDE (POUR BTL) OPTIME
TOPICAL | Status: DC | PRN
  Administered 2015-07-05 (×3): 1000 mL

## 2015-07-05 MED ORDER — POTASSIUM CHLORIDE IN NACL 20-0.9 MEQ/L-% IV SOLN
INTRAVENOUS | Status: DC
  Administered 2015-07-05 – 2015-07-09 (×9): via INTRAVENOUS
  Filled 2015-07-05 (×11): qty 1000

## 2015-07-05 MED ORDER — MORPHINE SULFATE 2 MG/ML IV SOLN
INTRAVENOUS | Status: DC
  Administered 2015-07-05: 09:00:00 via INTRAVENOUS
  Administered 2015-07-05: 3 mg via INTRAVENOUS
  Administered 2015-07-05: 7.5 mg via INTRAVENOUS
  Administered 2015-07-05: 6 mg via INTRAVENOUS
  Administered 2015-07-06 (×2): 4.5 mg via INTRAVENOUS
  Administered 2015-07-06 (×3): 1.5 mg via INTRAVENOUS
  Administered 2015-07-07: 6 mg via INTRAVENOUS
  Administered 2015-07-07: 0 mg via INTRAVENOUS
  Administered 2015-07-07: 2 mg via INTRAVENOUS
  Administered 2015-07-07: 4.6 mg via INTRAVENOUS
  Administered 2015-07-07: 7.5 mg via INTRAVENOUS
  Administered 2015-07-07: 4.5 mg via INTRAVENOUS
  Administered 2015-07-08: 10.5 mg via INTRAVENOUS
  Administered 2015-07-08 (×2): 7.5 mg via INTRAVENOUS
  Administered 2015-07-08: 4.5 mg via INTRAVENOUS
  Administered 2015-07-08 (×2): 3 mg via INTRAVENOUS
  Administered 2015-07-09: 5 mg via INTRAVENOUS
  Administered 2015-07-09: 1.5 mg via INTRAVENOUS
  Filled 2015-07-05 (×3): qty 25

## 2015-07-05 MED ORDER — DIPHENHYDRAMINE HCL 50 MG/ML IJ SOLN
12.5000 mg | Freq: Four times a day (QID) | INTRAMUSCULAR | Status: DC | PRN
  Administered 2015-07-07: 12.5 mg via INTRAVENOUS
  Filled 2015-07-05: qty 1

## 2015-07-05 MED ORDER — ONDANSETRON HCL 4 MG/2ML IJ SOLN
4.0000 mg | Freq: Once | INTRAMUSCULAR | Status: AC
  Administered 2015-07-05: 4 mg via INTRAVENOUS
  Filled 2015-07-05: qty 2

## 2015-07-05 MED ORDER — ENOXAPARIN SODIUM 40 MG/0.4ML ~~LOC~~ SOLN
40.0000 mg | SUBCUTANEOUS | Status: DC
  Administered 2015-07-07 – 2015-07-10 (×4): 40 mg via SUBCUTANEOUS
  Filled 2015-07-05 (×5): qty 0.4

## 2015-07-05 MED ORDER — PROPOFOL 10 MG/ML IV BOLUS
INTRAVENOUS | Status: DC | PRN
  Administered 2015-07-05: 160 mg via INTRAVENOUS

## 2015-07-05 MED ORDER — SUCCINYLCHOLINE CHLORIDE 20 MG/ML IJ SOLN
INTRAMUSCULAR | Status: DC | PRN
  Administered 2015-07-05: 100 mg via INTRAVENOUS

## 2015-07-05 MED ORDER — PIPERACILLIN-TAZOBACTAM 3.375 G IVPB
3.3750 g | Freq: Three times a day (TID) | INTRAVENOUS | Status: DC
  Administered 2015-07-05 – 2015-07-10 (×16): 3.375 g via INTRAVENOUS
  Filled 2015-07-05 (×19): qty 50

## 2015-07-05 MED ORDER — ROCURONIUM BROMIDE 100 MG/10ML IV SOLN
INTRAVENOUS | Status: DC | PRN
  Administered 2015-07-05: 30 mg via INTRAVENOUS

## 2015-07-05 SURGICAL SUPPLY — 43 items
BLADE SURG ROTATE 9660 (MISCELLANEOUS) ×1 IMPLANT
CANISTER SUCTION 2500CC (MISCELLANEOUS) ×2 IMPLANT
COVER SURGICAL LIGHT HANDLE (MISCELLANEOUS) ×2 IMPLANT
DRAPE LAPAROSCOPIC ABDOMINAL (DRAPES) ×2 IMPLANT
DRAPE WARM FLUID 44X44 (DRAPE) ×2 IMPLANT
DRSG OPSITE POSTOP 4X10 (GAUZE/BANDAGES/DRESSINGS) IMPLANT
DRSG OPSITE POSTOP 4X8 (GAUZE/BANDAGES/DRESSINGS) IMPLANT
DRSG PAD ABDOMINAL 8X10 ST (GAUZE/BANDAGES/DRESSINGS) ×1 IMPLANT
ELECT BLADE 6.5 EXT (BLADE) IMPLANT
ELECT CAUTERY BLADE 6.4 (BLADE) ×2 IMPLANT
ELECT REM PT RETURN 9FT ADLT (ELECTROSURGICAL) ×2
ELECTRODE REM PT RTRN 9FT ADLT (ELECTROSURGICAL) ×1 IMPLANT
GLOVE SURG SIGNA 7.5 PF LTX (GLOVE) ×2 IMPLANT
GOWN STRL REUS W/ TWL LRG LVL3 (GOWN DISPOSABLE) ×1 IMPLANT
GOWN STRL REUS W/ TWL XL LVL3 (GOWN DISPOSABLE) ×1 IMPLANT
GOWN STRL REUS W/TWL LRG LVL3 (GOWN DISPOSABLE) ×2
GOWN STRL REUS W/TWL XL LVL3 (GOWN DISPOSABLE) ×2
KIT BASIN OR (CUSTOM PROCEDURE TRAY) ×2 IMPLANT
KIT OSTOMY DRAINABLE 2.75 STR (WOUND CARE) ×1 IMPLANT
KIT ROOM TURNOVER OR (KITS) ×2 IMPLANT
LIGASURE IMPACT 36 18CM CVD LR (INSTRUMENTS) ×1 IMPLANT
NS IRRIG 1000ML POUR BTL (IV SOLUTION) ×6 IMPLANT
PACK GENERAL/GYN (CUSTOM PROCEDURE TRAY) ×2 IMPLANT
PAD ARMBOARD 7.5X6 YLW CONV (MISCELLANEOUS) ×2 IMPLANT
RELOAD PROXIMATE 75MM BLUE (ENDOMECHANICALS) ×2 IMPLANT
RELOAD STAPLE 75 3.8 BLU REG (ENDOMECHANICALS) IMPLANT
SPECIMEN JAR LARGE (MISCELLANEOUS) ×1 IMPLANT
SPONGE GAUZE 4X4 12PLY STER LF (GAUZE/BANDAGES/DRESSINGS) ×1 IMPLANT
SPONGE LAP 18X18 X RAY DECT (DISPOSABLE) IMPLANT
STAPLER PROXIMATE 75MM BLUE (STAPLE) ×1 IMPLANT
STAPLER VISISTAT 35W (STAPLE) ×2 IMPLANT
SUCTION POOLE TIP (SUCTIONS) ×2 IMPLANT
SUT PDS AB 1 TP1 96 (SUTURE) ×4 IMPLANT
SUT PROLENE 2 0 CT2 30 (SUTURE) ×1 IMPLANT
SUT SILK 2 0 SH CR/8 (SUTURE) ×2 IMPLANT
SUT SILK 2 0 TIES 10X30 (SUTURE) ×2 IMPLANT
SUT SILK 3 0 SH CR/8 (SUTURE) ×2 IMPLANT
SUT SILK 3 0 TIES 10X30 (SUTURE) ×2 IMPLANT
SUT VIC AB 3-0 SH 18 (SUTURE) ×2 IMPLANT
TOWEL OR 17X24 6PK STRL BLUE (TOWEL DISPOSABLE) ×2 IMPLANT
TOWEL OR 17X26 10 PK STRL BLUE (TOWEL DISPOSABLE) ×2 IMPLANT
TRAY FOLEY CATH 16FRSI W/METER (SET/KITS/TRAYS/PACK) ×1 IMPLANT
YANKAUER SUCT BULB TIP NO VENT (SUCTIONS) IMPLANT

## 2015-07-05 NOTE — Op Note (Signed)
PARTIAL SIGMOID COLECTOMY AND COLOSTOMY  Procedure Note  JOEPH VIDAL 07/04/2015 - 07/05/2015   Pre-op Diagnosis: perforated diverticulitis     Post-op Diagnosis: same  Procedure(s): PARTIAL SIGMOID COLECTOMY AND COLOSTOMY (Hartman's Procedure)  Surgeon(s): [name], MD  Anesthesia: General  Staff:  Circulator: Lottie Mussel, RN Scrub Person: Dennison Mascot, NT Circulator Assistant: Candi Leash, RN  Estimated Blood Loss: Minimal               Specimens: sent to path          [facility]   Date: 07/05/2015  Time: 5:58 AM

## 2015-07-05 NOTE — Op Note (Signed)
NAME:  Miguel [name], Miguel [name] NO.:  [id number]  MEDICAL RECORD NO.:  [id number]  LOCATION:  [facility]                         FACILITY:  [facility]  PHYSICIAN:  [name], M.D. DATE OF BIRTH:  1950-10-17  DATE OF PROCEDURE:  07/05/2015 DATE OF DISCHARGE:                              OPERATIVE REPORT   PREOPERATIVE DIAGNOSIS:  Perforated sigmoid diverticulitis.  POSTOPERATIVE DIAGNOSIS:  Perforated sigmoid diverticulitis.  PROCEDURE:  Sigmoid colectomy with end-colostomy (Hartmann's procedure).  SURGEON:  [name], M.D.  ANESTHESIA:  General.  ESTIMATED BLOOD LOSS:  Minimal.  INDICATIONS:  This is a 65 year old gentleman, presented to [facility] with a 2-day history of lower abdominal pain.  He had a CAT scan of the abdomen and pelvis showing to have free air and inflammation of the sigmoid colon consistent with perforated sigmoid diverticulitis. He was found on physical examination to have a rigid abdomen.  Decision was made to proceed to the operating room.  FINDINGS:  The patient was found to have perforation of the sigmoid colon.  There was no gross stool.  There was turbid fluid and fibrinous exudate.  No other abnormalities were identified.  The patient did have a large hiatal hernia with all of his stomach and his chest and I was able to easily saw this back into the abdominal cavity.  PROCEDURE IN DETAIL:  The patient was brought to the operating room, identified as Miguel [name].  He was placed supine on the operating room table and general anesthesia was induced.  His abdomen was then prepped and draped in usual sterile fashion after Foley catheter was inserted. I then created a lower midline incision with a scalpel.  I took this down to the fascia with electrocautery.  The perineum was then opened the entire length of the incision.  I incorporated a small umbilical hernia defect with this.  At this point, the patient was found to  have turbid fluid.  He had a redundant loop of sigmoid colon with obvious diverticulitis and perforation.  There was fibrinous exudate and turbid fluid, but no gross stool.  I transected the area of perforation proximal and distal with the GIA 75 stapler.  I then took down the mesentery with the Harmonic scalpel.  The specimen was sent to Pathology for evaluation.  I then mobilized the proximal colon along the white line of Toldt.  I placed a 2-0 Prolene suture at the suture line of the Hartmann's pouch.  I then made an elliptical incision in the patient's left abdomen.  I took this down to the fascia, which was opened in a cruciate fashion.  I then bluntly dissected free the underlying muscle and opened up the perineum.  I then pulled out the descending colon as an end-colostomy.  I then irrigated the abdomen with multiple liters of normal saline.  Hemostasis appeared to be achieved.  I then closed the patient's midline fascia with a running #1 looped PDS suture.  I then secured the colon to the fascia with a silk suture.  I then excised the staple line with the cautery and matured the ostomy circumferentially with interrupted 3-0 Vicryl sutures.  A pink and well-perfused  anastomosis appeared to be achieved.  I then packed the midline incision with wet-to-dry saline gauze.  Dry gauze was placed over this.  An ostomy appliance was placed as well.  The patient tolerated the procedure well.  All the counts were correct at the end of the procedure.  The patient was then extubated in the operating room and taken in a stable condition to the recovery room.     [name], M.D.     DB/MEDQ  D:  07/05/2015  T:  07/05/2015  Job:  [id number]

## 2015-07-05 NOTE — Progress Notes (Signed)
Received patient from PACU RN Leana Roe.  Patient AOx4, VS stable with tachy PR at low 100's, with foley cath, LLQ colostomy and SCD.  CNA oriented patient to room, bed controls and call light.  Will endorse to day shift RN appropriately.

## 2015-07-05 NOTE — Anesthesia Preprocedure Evaluation (Addendum)
Anesthesia Evaluation  Patient identified by MRN, date of birth, ID band Patient awake    Reviewed: Allergy & Precautions, NPO status , Patient's Chart, lab work & pertinent test results  Airway Mallampati: II  TM Distance: >3 FB Neck ROM: Full    Dental   Pulmonary neg pulmonary ROS,    breath sounds clear to auscultation       Cardiovascular negative cardio ROS   Rhythm:Regular Rate:Normal     Neuro/Psych    GI/Hepatic Neg liver ROS, GI history noted. CE   Endo/Other    Renal/GU Renal disease     Musculoskeletal   Abdominal   Peds  Hematology   Anesthesia Other Findings   Reproductive/Obstetrics                            Anesthesia Physical Anesthesia Plan  ASA: I and emergent  Anesthesia Plan: General   Post-op Pain Management:    Induction: Intravenous, Rapid sequence and Cricoid pressure planned  Airway Management Planned: Oral ETT  Additional Equipment:   Intra-op Plan:   Post-operative Plan: Possible Post-op intubation/ventilation  Informed Consent: I have reviewed the patients History and Physical, chart, labs and discussed the procedure including the risks, benefits and alternatives for the proposed anesthesia with the patient or authorized representative who has indicated his/her understanding and acceptance.   Dental advisory given  Plan Discussed with: CRNA and Anesthesiologist  Anesthesia Plan Comments:         Anesthesia Quick Evaluation

## 2015-07-05 NOTE — H&P (Signed)
Miguel [name] is an 65 y.o. male.   Chief Complaint: abdominal pain HPI: This gentleman is transferred from [facility]. He presented there with a greater than one day history of lower abdominal pain. He awoke Thursday morning with crampy abdominal pain. His pain is now diffuse. He describes it as sharp. He describes it as moderate in intensity. He has had no emesis. He denies fever. He has no previous history of similar abdominal pain. He just had a bowel movement was normal. He did have some nausea on transfer from the other facility. He is otherwise without complaints.  Past Medical History  Diagnosis Date  . Anemia   . Cancer ([place])   . Chronic kidney disease     KIDNEY STONE  . Hyperlipidemia     Past Surgical History  Procedure Laterality Date  . Knee surgery  1971  . Skin cancer removed  2001/2009    Family History  Problem Relation Age of Onset  . Stroke Mother   . Stroke Brother   . Heart disease Brother    Social History:  reports that he has never smoked. He does not have any smokeless tobacco history on file. He reports that he does not drink alcohol. His drug history is not on file.  Allergies: No Known Allergies   (Not in a hospital admission)  Results for orders placed or performed during the hospital encounter of 07/04/15 (from the past 48 hour(s))  CBC with Differential/Platelet     Status: Abnormal   Collection Time: 07/04/15  9:44 PM  Result Value Ref Range   WBC 14.8 (H) 4.0 - 10.5 K/uL   RBC 4.50 4.22 - 5.81 MIL/uL   Hemoglobin 13.7 13.0 - 17.0 g/dL   HCT 40.6 39.0 - 52.0 %   MCV 90.2 78.0 - 100.0 fL   MCH 30.4 26.0 - 34.0 pg   MCHC 33.7 30.0 - 36.0 g/dL   RDW 13.6 11.5 - 15.5 %   Platelets 217 150 - 400 K/uL   Neutrophils Relative % 84 %   Neutro Abs 12.4 (H) 1.7 - 7.7 K/uL   Lymphocytes Relative 9 %   Lymphs Abs 1.3 0.7 - 4.0 K/uL   Monocytes Relative 6 %   Monocytes Absolute 0.9 0.1 - 1.0 K/uL   Eosinophils Relative 1 %   Eosinophils  Absolute 0.1 0.0 - 0.7 K/uL   Basophils Relative 0 %   Basophils Absolute 0.0 0.0 - 0.1 K/uL  Comprehensive metabolic panel     Status: Abnormal   Collection Time: 07/04/15  9:44 PM  Result Value Ref Range   Sodium 137 135 - 145 mmol/L   Potassium 4.5 3.5 - 5.1 mmol/L   Chloride 104 101 - 111 mmol/L   CO2 24 22 - 32 mmol/L   Glucose, Bld 115 (H) 65 - 99 mg/dL   BUN 18 6 - 20 mg/dL   Creatinine, Ser 1.06 0.61 - 1.24 mg/dL   Calcium 8.8 (L) 8.9 - 10.3 mg/dL   Total Protein 7.4 6.5 - 8.1 g/dL   Albumin 4.2 3.5 - 5.0 g/dL   AST 22 15 - 41 U/L   ALT 25 17 - 63 U/L   Alkaline Phosphatase 59 38 - 126 U/L   Total Bilirubin 1.0 0.3 - 1.2 mg/dL   GFR calc non Af Amer >60 >60 mL/min   GFR calc Af Amer >60 >60 mL/min    Comment: (NOTE) The eGFR has been calculated using the CKD EPI equation.  This calculation has not been validated in all clinical situations. eGFR's persistently <60 mL/min signify possible Chronic Kidney Disease.    Anion gap 9 5 - 15   Ct Abdomen Pelvis W Contrast  07/04/2015  CLINICAL DATA:  Epigastric and periumbilical pain for 2 days. Febrile. Leukocytosis. EXAM: CT ABDOMEN AND PELVIS WITH CONTRAST TECHNIQUE: Multidetector CT imaging of the abdomen and pelvis was performed using the standard protocol following bolus administration of intravenous contrast. CONTRAST:  155m ISOVUE-300 IOPAMIDOL (ISOVUE-300) INJECTION 61% COMPARISON:  11/03/2013 FINDINGS: There is a moderate volume free intraperitoneal air. This likely originates from a perforated proximal sigmoid diverticulitis. There is intense inflammatory change surrounding this portion of the sigmoid. No drainable abscess. There is mild dilatation of small bowel without caliber transition, perhaps reactive. A discrete small bowel lesion or focal small bowel inflammation is not evident. Appendix is normal. Stomach is nearly completely herniated into the chest but is unobstructed and not inflamed. The liver is remarkable only  for a 10 mm hypodensity adjacent to the gallbladder fossa, most likely a benign cyst. The gallbladder and bile ducts are unremarkable. The pancreas is normal. The spleen is normal. The adrenals are normal. There is a 2.4 cm cyst of the right renal upper pole. There are 2 left lower pole collecting system calculi measuring up to 4 mm. No ureteral calculi. There are multiple small calculus fragments within the urinary bladder lumen, only about 2 mm each. Otherwise unremarkable appearances of the urinary bladder. There is prominent prostatic enlargement. The abdominal aorta is normal in caliber. There is mild atherosclerotic calcification. There is no adenopathy in the abdomen or pelvis. There is no significant abnormality in the lower chest except for the large hiatal hernia. There is no significant skeletal lesion. IMPRESSION: 1. Free intraperitoneal air. This probably originates from a perforated sigmoid diverticulitis. 2. Large hiatal hernia, containing nearly the entire stomach. No evidence of obstruction or incarceration within the hernia. 3. Left nephrolithiasis. Several tiny calculus fragments within the urinary bladder lumen. No ureteral calculi. 4. These results were called by telephone at the time of interpretation on 07/04/2015 at 11:33 pm to Dr. MDolly Rias who verbally acknowledged these results. Electronically Signed   By: DAndreas NewportM.D.   On: 07/04/2015 23:35    Review of Systems  All other systems reviewed and are negative.   Blood pressure 157/95, pulse 101, temperature 98.9 F (37.2 C), temperature source Oral, resp. rate 18, height '5\' 5"'  (1.651 m), weight 78.019 kg (172 lb), SpO2 97 %. Physical Exam  Constitutional: He is oriented to person, place, and time. He appears well-developed and well-nourished. He appears distressed.  HENT:  Head: Normocephalic and atraumatic.  Right Ear: External ear normal.  Left Ear: External ear normal.  Nose: Nose normal.  Mouth/Throat: Oropharynx is  clear and moist. No oropharyngeal exudate.  Eyes: Conjunctivae are normal. Pupils are equal, round, and reactive to light. Right eye exhibits no discharge. Left eye exhibits no discharge. No scleral icterus.  Neck: Normal range of motion. No tracheal deviation present.  Cardiovascular: Regular rhythm, normal heart sounds and intact distal pulses.   No murmur heard. Tachycardic  Respiratory: Effort normal and breath sounds normal. No respiratory distress. He has no wheezes.  GI: There is tenderness. There is rebound and guarding.  He has a diffusely tender abdomen with guarding throughout. There is a reducible umbilical hernia  Musculoskeletal: Normal range of motion. He exhibits no edema or tenderness.  Lymphadenopathy:    He has no  cervical adenopathy.  Neurological: He is alert and oriented to person, place, and time.  Skin: Skin is warm and dry. No rash noted. He is not diaphoretic. No erythema.  Psychiatric: His behavior is normal. Judgment normal.     Assessment/Plan Perforated diverticulitis  I discussed this with the patient and his family. I recommend emergent exploratory laparotomy with partial colectomy and colostomy. I discussed with the patient the reasonings for this. I discussed the risk of surgery which includes but is not limited to bleeding, infection, injury to surrounding structures, the need for the bowel resection and ostomy, cardiopulmonary issues, DVT, postoperative recovery, etc. He agrees to proceed with surgery which again is scheduled emergently. He was given Zosyn prior to transfer.  Harl Bowie, MD 07/05/2015, 4:07 AM

## 2015-07-05 NOTE — Anesthesia Procedure Notes (Signed)
Procedure Name: Intubation Date/Time: 07/05/2015 4:54 AM Performed by: Valetta Fuller Pre-anesthesia Checklist: Patient identified, Emergency Drugs available, Suction available and Patient being monitored Patient Re-evaluated:Patient Re-evaluated prior to inductionOxygen Delivery Method: Circle system utilized Preoxygenation: Pre-oxygenation with 100% oxygen Intubation Type: IV induction, Rapid sequence and Cricoid Pressure applied Laryngoscope Size: Miller and 2 Grade View: Grade I Tube type: Oral Tube size: 7.5 mm Number of attempts: 1 Airway Equipment and Method: Stylet Placement Confirmation: ETT inserted through vocal cords under direct vision,  positive ETCO2 and breath sounds checked- equal and bilateral Secured at: 23 cm Tube secured with: Tape Dental Injury: Teeth and Oropharynx as per pre-operative assessment

## 2015-07-05 NOTE — ED Provider Notes (Signed)
Medical screening examination/treatment/procedure(s) were conducted as a shared visit with non-physician practitioner(s) and myself.  I personally evaluated the patient during the encounter.  2 days of progressively worsening abdominal pain. Today with worsening swelling. Decreased PO.  Exam with tympanic abdomen and involuntary guarding, slight tachycardia, otherwise HDS.  CT with free air, zosyn and fluids initiated. Surgery consulted.    EKG Interpretation   Date/Time:  Friday July 04 2015 20:37:32 EDT Ventricular Rate:  101 PR Interval:  146 QRS Duration: 90 QT Interval:  340 QTC Calculation: 440 R Axis:   -61 Text Interpretation:  Sinus tachycardia Possible Left atrial enlargement  Left anterior fascicular block Abnormal ECG Confirmed by Seerat Peaden MD, Corene Cornea  [phone]) on 07/04/2015 11:46:14 PM      CRITICAL CARE Performed by: Merrily Pew  Total critical care time: 35 minutes Critical care time was exclusive of separately billable procedures and treating other patients. Critical care was necessary to treat or prevent imminent or life-threatening deterioration. Critical care was time spent personally by me on the following activities: development of treatment plan with patient and/or surrogate as well as nursing, discussions with consultants, evaluation of patient's response to treatment, examination of patient, obtaining history from patient or surrogate, ordering and performing treatments and interventions, ordering and review of laboratory studies, ordering and review of radiographic studies, pulse oximetry and re-evaluation of patient's condition.  Merrily Pew, MD 07/05/15 [phone]

## 2015-07-05 NOTE — Transfer of Care (Signed)
Immediate Anesthesia Transfer of Care Note  Patient: Miguel [name]  Procedure(s) Performed: Procedure(s): PARTIAL SIGMOID COLECTOMY AND COLOSTOMY (N/A)  Patient Location: PACU  Anesthesia Type:General  Level of Consciousness: awake, alert  and oriented  Airway & Oxygen Therapy: Patient connected to nasal cannula oxygen  Post-op Assessment: Report given to RN and Post -op Vital signs reviewed and stable  Post vital signs: Reviewed and stable  Last Vitals:  Filed Vitals:   07/05/15 0415 07/05/15 0609  BP: 157/94 165/91  Pulse: 95 108  Temp:    Resp:  20    Complications: No apparent anesthesia complications

## 2015-07-06 LAB — CBC
HCT: 36.9 % — ABNORMAL LOW (ref 39.0–52.0)
Hemoglobin: 11.9 g/dL — ABNORMAL LOW (ref 13.0–17.0)
MCH: 29.5 pg (ref 26.0–34.0)
MCHC: 32.2 g/dL (ref 30.0–36.0)
MCV: 91.3 fL (ref 78.0–100.0)
Platelets: 217 10*3/uL (ref 150–400)
RBC: 4.04 MIL/uL — AB (ref 4.22–5.81)
RDW: 13.8 % (ref 11.5–15.5)
WBC: 10.6 10*3/uL — AB (ref 4.0–10.5)

## 2015-07-06 LAB — BASIC METABOLIC PANEL
Anion gap: 11 (ref 5–15)
BUN: 12 mg/dL (ref 6–20)
CALCIUM: 7.9 mg/dL — AB (ref 8.9–10.3)
CO2: 22 mmol/L (ref 22–32)
CREATININE: 1.17 mg/dL (ref 0.61–1.24)
Chloride: 104 mmol/L (ref 101–111)
GFR calc non Af Amer: >60 mL/min (ref >60)
Glucose, Bld: 130 mg/dL — ABNORMAL HIGH (ref 65–99)
Potassium: 4.1 mmol/L (ref 3.5–5.1)
SODIUM: 137 mmol/L (ref 135–145)

## 2015-07-06 MED ORDER — LORAZEPAM 2 MG/ML IJ SOLN
1.0000 mg | Freq: Three times a day (TID) | INTRAMUSCULAR | Status: DC | PRN
  Administered 2015-07-06 – 2015-07-07 (×3): 1 mg via INTRAVENOUS
  Filled 2015-07-06 (×3): qty 1

## 2015-07-06 MED ORDER — LORAZEPAM 2 MG/ML IJ SOLN
1.0000 mg | Freq: Once | INTRAMUSCULAR | Status: AC
  Administered 2015-07-06: 1 mg via INTRAVENOUS
  Filled 2015-07-06: qty 1

## 2015-07-06 NOTE — Progress Notes (Signed)
Patient ID: Miguel [name], male   DOB: 23-Oct-1950, 65 y.o.   MRN: [id number]     [facility]      [address] [place], [place] [address]    Phone: [phone] FAX: [phone]     Subjective: Intermittent nausea, no vomiting. Ambulated x2 in hallways. VSS.  Afebrile.  WBC trending down.   Objective:  Vital signs:  Filed Vitals:   07/05/15 2024 07/06/15 0020 07/06/15 0355 07/06/15 0420  BP:    129/86  Pulse:    100  Temp:    98.9 F (37.2 C)  TempSrc:    Oral  Resp: '16 18 20 19  ' Height:      Weight:      SpO2: 95% 97% 95% 96%       Intake/Output   Yesterday:  04/08 0701 - 04/09 0700 In: 60 [P.O.:60] Out: 1550 [Urine:1550] This shift:    I/O last 3 completed shifts: In: 1660 [P.O.:60; I.V.:1600] Out: 9747 [Urine:1700; Blood:50]   Physical Exam: General: Pt awake/alert/oriented x4 in no acute distress Chest: cta.  No chest wall pain w good excursion CV:  Pulses intact.  Regular rhythm Abdomen: Soft.  Nondistended.  +BS.  Ostomy with air, and dark serosang output, pink and viable.  No evidence of peritonitis.  No incarcerated hernias. Ext:  SCDs BLE.  No mjr edema.  No cyanosis Skin: No petechiae / purpura   Problem List:   Active Problems:   Diverticulitis of colon with perforation    Results:   Labs: Results for orders placed or performed during the hospital encounter of 07/04/15 (from the past 48 hour(s))  CBC with Differential/Platelet     Status: Abnormal   Collection Time: 07/04/15  9:44 PM  Result Value Ref Range   WBC 14.8 (H) 4.0 - 10.5 K/uL   RBC 4.50 4.22 - 5.81 MIL/uL   Hemoglobin 13.7 13.0 - 17.0 g/dL   HCT 40.6 39.0 - 52.0 %   MCV 90.2 78.0 - 100.0 fL   MCH 30.4 26.0 - 34.0 pg   MCHC 33.7 30.0 - 36.0 g/dL   RDW 13.6 11.5 - 15.5 %   Platelets 217 150 - 400 K/uL   Neutrophils Relative % 84 %   Neutro Abs 12.4 (H) 1.7 - 7.7 K/uL   Lymphocytes Relative 9 %   Lymphs Abs 1.3 0.7 - 4.0  K/uL   Monocytes Relative 6 %   Monocytes Absolute 0.9 0.1 - 1.0 K/uL   Eosinophils Relative 1 %   Eosinophils Absolute 0.1 0.0 - 0.7 K/uL   Basophils Relative 0 %   Basophils Absolute 0.0 0.0 - 0.1 K/uL  Comprehensive metabolic panel     Status: Abnormal   Collection Time: 07/04/15  9:44 PM  Result Value Ref Range   Sodium 137 135 - 145 mmol/L   Potassium 4.5 3.5 - 5.1 mmol/L   Chloride 104 101 - 111 mmol/L   CO2 24 22 - 32 mmol/L   Glucose, Bld 115 (H) 65 - 99 mg/dL   BUN 18 6 - 20 mg/dL   Creatinine, Ser 1.06 0.61 - 1.24 mg/dL   Calcium 8.8 (L) 8.9 - 10.3 mg/dL   Total Protein 7.4 6.5 - 8.1 g/dL   Albumin 4.2 3.5 - 5.0 g/dL   AST 22 15 - 41 U/L   ALT 25 17 - 63 U/L   Alkaline Phosphatase 59 38 - 126 U/L   Total Bilirubin 1.0 0.3 -  1.2 mg/dL   GFR calc non Af Amer >60 >60 mL/min   GFR calc Af Amer >60 >60 mL/min    Comment: (NOTE) The eGFR has been calculated using the CKD EPI equation. This calculation has not been validated in all clinical situations. eGFR's persistently <60 mL/min signify possible Chronic Kidney Disease.    Anion gap 9 5 - 15  Basic metabolic panel     Status: Abnormal   Collection Time: 07/06/15  6:43 AM  Result Value Ref Range   Sodium 137 135 - 145 mmol/L   Potassium 4.1 3.5 - 5.1 mmol/L   Chloride 104 101 - 111 mmol/L   CO2 22 22 - 32 mmol/L   Glucose, Bld 130 (H) 65 - 99 mg/dL   BUN 12 6 - 20 mg/dL   Creatinine, Ser 1.17 0.61 - 1.24 mg/dL   Calcium 7.9 (L) 8.9 - 10.3 mg/dL   GFR calc non Af Amer >60 >60 mL/min   GFR calc Af Amer >60 >60 mL/min    Comment: (NOTE) The eGFR has been calculated using the CKD EPI equation. This calculation has not been validated in all clinical situations. eGFR's persistently <60 mL/min signify possible Chronic Kidney Disease.    Anion gap 11 5 - 15  CBC     Status: Abnormal   Collection Time: 07/06/15  6:43 AM  Result Value Ref Range   WBC 10.6 (H) 4.0 - 10.5 K/uL   RBC 4.04 (L) 4.22 - 5.81 MIL/uL    Hemoglobin 11.9 (L) 13.0 - 17.0 g/dL   HCT 36.9 (L) 39.0 - 52.0 %   MCV 91.3 78.0 - 100.0 fL   MCH 29.5 26.0 - 34.0 pg   MCHC 32.2 30.0 - 36.0 g/dL   RDW 13.8 11.5 - 15.5 %   Platelets 217 150 - 400 K/uL    Imaging / Studies: Ct Abdomen Pelvis W Contrast  07/04/2015  CLINICAL DATA:  Epigastric and periumbilical pain for 2 days. Febrile. Leukocytosis. EXAM: CT ABDOMEN AND PELVIS WITH CONTRAST TECHNIQUE: Multidetector CT imaging of the abdomen and pelvis was performed using the standard protocol following bolus administration of intravenous contrast. CONTRAST:  126m ISOVUE-300 IOPAMIDOL (ISOVUE-300) INJECTION 61% COMPARISON:  11/03/2013 FINDINGS: There is a moderate volume free intraperitoneal air. This likely originates from a perforated proximal sigmoid diverticulitis. There is intense inflammatory change surrounding this portion of the sigmoid. No drainable abscess. There is mild dilatation of small bowel without caliber transition, perhaps reactive. A discrete small bowel lesion or focal small bowel inflammation is not evident. Appendix is normal. Stomach is nearly completely herniated into the chest but is unobstructed and not inflamed. The liver is remarkable only for a 10 mm hypodensity adjacent to the gallbladder fossa, most likely a benign cyst. The gallbladder and bile ducts are unremarkable. The pancreas is normal. The spleen is normal. The adrenals are normal. There is a 2.4 cm cyst of the right renal upper pole. There are 2 left lower pole collecting system calculi measuring up to 4 mm. No ureteral calculi. There are multiple small calculus fragments within the urinary bladder lumen, only about 2 mm each. Otherwise unremarkable appearances of the urinary bladder. There is prominent prostatic enlargement. The abdominal aorta is normal in caliber. There is mild atherosclerotic calcification. There is no adenopathy in the abdomen or pelvis. There is no significant abnormality in the lower chest  except for the large hiatal hernia. There is no significant skeletal lesion. IMPRESSION: 1. Free intraperitoneal air. This probably  originates from a perforated sigmoid diverticulitis. 2. Large hiatal hernia, containing nearly the entire stomach. No evidence of obstruction or incarceration within the hernia. 3. Left nephrolithiasis. Several tiny calculus fragments within the urinary bladder lumen. No ureteral calculi. 4. These results were called by telephone at the time of interpretation on 07/04/2015 at 11:33 pm to Dr. Dolly Rias, who verbally acknowledged these results. Electronically Signed   By: Andreas Newport M.D.   On: 07/04/2015 23:35    Medications / Allergies:  Scheduled Meds: . enoxaparin (LOVENOX) injection  40 mg Subcutaneous Q24H  . morphine   Intravenous 6 times per day  . piperacillin-tazobactam (ZOSYN)  IV  3.375 g Intravenous 3 times per day   Continuous Infusions: . 0.9 % NaCl with KCl 20 mEq / L 125 mL/hr at 07/05/15 2158   PRN Meds:.diphenhydrAMINE **OR** diphenhydrAMINE, naloxone **AND** sodium chloride flush, ondansetron **OR** ondansetron (ZOFRAN) IV  Antibiotics: Anti-infectives    Start     Dose/Rate Route Frequency Ordered Stop   07/05/15 0730  piperacillin-tazobactam (ZOSYN) IVPB 3.375 g     3.375 g 12.5 mL/hr over 240 Minutes Intravenous 3 times per day 07/05/15 0708     07/04/15 2345  piperacillin-tazobactam (ZOSYN) IVPB 3.375 g     3.375 g 100 mL/hr over 30 Minutes Intravenous  Once 07/04/15 2336 07/05/15 0018        Assessment/Plan Perforated sigmoid diverticulitis POD#1 hartmann's procedure---Dr. Ninfa Linden -sips/chips today, PCA, mobilize, IS, BID wet to dry dressing changes  ID-zosyn D#1 VTE prophylaxis-SCD/lovenox Dispo-ileus   Erby Pian, ANP-BC [facility]   07/06/2015 8:41 AM

## 2015-07-06 NOTE — Progress Notes (Signed)
Noted bloody drainage, thin in the colostomy bag, approx 150-200 cc.  PA paged and notified.  OK to give Lovenox.

## 2015-07-06 NOTE — Progress Notes (Signed)
Pt getting agitated and wants to take his ativan pill from home.  Wife came to get me and told me.  I went in and told pt he is not allowed to take any medicine from home. Texted Dr. Ninfa Linden for ativan order prn.

## 2015-07-07 LAB — BASIC METABOLIC PANEL
Anion gap: 8 (ref 5–15)
BUN: 7 mg/dL (ref 6–20)
CO2: 26 mmol/L (ref 22–32)
CREATININE: 0.99 mg/dL (ref 0.61–1.24)
Calcium: 7.8 mg/dL — ABNORMAL LOW (ref 8.9–10.3)
Chloride: 101 mmol/L (ref 101–111)
Glucose, Bld: 128 mg/dL — ABNORMAL HIGH (ref 65–99)
Potassium: 4 mmol/L (ref 3.5–5.1)
SODIUM: 135 mmol/L (ref 135–145)

## 2015-07-07 LAB — CBC
HCT: 33.4 % — ABNORMAL LOW (ref 39.0–52.0)
Hemoglobin: 10.8 g/dL — ABNORMAL LOW (ref 13.0–17.0)
MCH: 29.3 pg (ref 26.0–34.0)
MCHC: 32.3 g/dL (ref 30.0–36.0)
MCV: 90.5 fL (ref 78.0–100.0)
PLATELETS: 191 10*3/uL (ref 150–400)
RBC: 3.69 MIL/uL — AB (ref 4.22–5.81)
RDW: 13.4 % (ref 11.5–15.5)
WBC: 7.7 10*3/uL (ref 4.0–10.5)

## 2015-07-07 MED ORDER — ZOLPIDEM TARTRATE 5 MG PO TABS
5.0000 mg | ORAL_TABLET | Freq: Every evening | ORAL | Status: DC | PRN
  Administered 2015-07-07: 5 mg via ORAL
  Filled 2015-07-07: qty 1

## 2015-07-07 NOTE — Progress Notes (Signed)
Patient ID: Miguel [name], male   DOB: 15-Mar-1951, 65 y.o.   MRN: [id number]     [name]      [address] [name], [name] [address]    Phone: [phone] FAX: [phone]     Subjective: Some nausea, but taking in clears. Ambulating. Afebrile.  VSS.  WBC normalized. No issues with voiding.  Objective:  Vital signs:  Filed Vitals:   07/06/15 2026 07/07/15 0031 07/07/15 0410 07/07/15 0515  BP:      Pulse:      Temp:      TempSrc:      Resp: '21 18 20 18  ' Height:      Weight:      SpO2: 95% 98% 97% 97%    Last BM Date: 07/04/15  Intake/Output   Yesterday:  04/09 0701 - 04/10 0700 In: 6427.7 [P.O.:1136; I.V.:5041.7; IV Piggyback:250] Out: 750 [Urine:600; Stool:150] This shift:    I/O last 3 completed shifts: In: 6427.7 [P.O.:1136; I.V.:5041.7; IV Piggyback:250] Out: 1950 [Urine:1800; Stool:150]    Physical Exam: General: Pt awake/alert/oriented x4 in no acute distress Chest: cta. No chest wall pain w good excursion CV: Pulses intact. Regular rhythm Abdomen: Soft. Nondistended. +BS. Ostomy with air, and dark serosang output, pink and viable. No evidence of peritonitis. No incarcerated hernias. Ext: SCDs BLE. No mjr edema. No cyanosis Skin: No petechiae / purpura    Problem List:   Active Problems:   Diverticulitis of colon with perforation    Results:   Labs: Results for orders placed or performed during the hospital encounter of 07/04/15 (from the past 48 hour(s))  Basic metabolic panel     Status: Abnormal   Collection Time: 07/06/15  6:43 AM  Result Value Ref Range   Sodium 137 135 - 145 mmol/L   Potassium 4.1 3.5 - 5.1 mmol/L   Chloride 104 101 - 111 mmol/L   CO2 22 22 - 32 mmol/L   Glucose, Bld 130 (H) 65 - 99 mg/dL   BUN 12 6 - 20 mg/dL   Creatinine, Ser 1.17 0.61 - 1.24 mg/dL   Calcium 7.9 (L) 8.9 - 10.3 mg/dL   GFR calc non Af Amer >60 >60 mL/min   GFR calc Af Amer >60 >60  mL/min    Comment: (NOTE) The eGFR has been calculated using the CKD EPI equation. This calculation has not been validated in all clinical situations. eGFR's persistently <60 mL/min signify possible Chronic Kidney Disease.    Anion gap 11 5 - 15  CBC     Status: Abnormal   Collection Time: 07/06/15  6:43 AM  Result Value Ref Range   WBC 10.6 (H) 4.0 - 10.5 K/uL   RBC 4.04 (L) 4.22 - 5.81 MIL/uL   Hemoglobin 11.9 (L) 13.0 - 17.0 g/dL   HCT 36.9 (L) 39.0 - 52.0 %   MCV 91.3 78.0 - 100.0 fL   MCH 29.5 26.0 - 34.0 pg   MCHC 32.2 30.0 - 36.0 g/dL   RDW 13.8 11.5 - 15.5 %   Platelets 217 150 - 400 K/uL  Basic metabolic panel     Status: Abnormal   Collection Time: 07/07/15  5:36 AM  Result Value Ref Range   Sodium 135 135 - 145 mmol/L   Potassium 4.0 3.5 - 5.1 mmol/L   Chloride 101 101 - 111 mmol/L   CO2 26 22 - 32 mmol/L   Glucose, Bld 128 (H) 65 - 99 mg/dL  BUN 7 6 - 20 mg/dL   Creatinine, Ser 0.99 0.61 - 1.24 mg/dL   Calcium 7.8 (L) 8.9 - 10.3 mg/dL   GFR calc non Af Amer >60 >60 mL/min   GFR calc Af Amer >60 >60 mL/min    Comment: (NOTE) The eGFR has been calculated using the CKD EPI equation. This calculation has not been validated in all clinical situations. eGFR's persistently <60 mL/min signify possible Chronic Kidney Disease.    Anion gap 8 5 - 15  CBC     Status: Abnormal   Collection Time: 07/07/15  5:36 AM  Result Value Ref Range   WBC 7.7 4.0 - 10.5 K/uL   RBC 3.69 (L) 4.22 - 5.81 MIL/uL   Hemoglobin 10.8 (L) 13.0 - 17.0 g/dL   HCT 33.4 (L) 39.0 - 52.0 %   MCV 90.5 78.0 - 100.0 fL   MCH 29.3 26.0 - 34.0 pg   MCHC 32.3 30.0 - 36.0 g/dL   RDW 13.4 11.5 - 15.5 %   Platelets 191 150 - 400 K/uL    Imaging / Studies: No results found.  Medications / Allergies:  Scheduled Meds: . enoxaparin (LOVENOX) injection  40 mg Subcutaneous Q24H  . morphine   Intravenous 6 times per day  . piperacillin-tazobactam (ZOSYN)  IV  3.375 g Intravenous 3 times per day    Continuous Infusions: . 0.9 % NaCl with KCl 20 mEq / L 125 mL/hr at 07/06/15 1931   PRN Meds:.diphenhydrAMINE **OR** diphenhydrAMINE, LORazepam, naloxone **AND** sodium chloride flush, ondansetron **OR** ondansetron (ZOFRAN) IV  Antibiotics: Anti-infectives    Start     Dose/Rate Route Frequency Ordered Stop   07/05/15 0730  piperacillin-tazobactam (ZOSYN) IVPB 3.375 g     3.375 g 12.5 mL/hr over 240 Minutes Intravenous 3 times per day 07/05/15 0708     07/04/15 2345  piperacillin-tazobactam (ZOSYN) IVPB 3.375 g     3.375 g 100 mL/hr over 30 Minutes Intravenous  Once 07/04/15 2336 07/05/15 0018         Assessment/Plan Perforated sigmoid diverticulitis POD#2 hartmann's procedure---Dr. Ninfa Linden -clears, PCA, mobilize, IS, BID wet to dry dressing changes  ID-zosyn D#2/7 VTE prophylaxis-SCD/lovenox Dispo-ileus   Erby Pian, ANP-BC [facility] Pager [phone](7A-4:30P) For consults and floor pages call [phone](7A-4:30P)  07/07/2015 8:12 AM

## 2015-07-07 NOTE — Anesthesia Postprocedure Evaluation (Signed)
Anesthesia Post Note  Patient: Miguel [name]  Procedure(s) Performed: Procedure(s) (LRB): PARTIAL SIGMOID COLECTOMY AND COLOSTOMY (N/A)  Patient location during evaluation: PACU Anesthesia Type: General Level of consciousness: awake Pain management: pain level controlled Vital Signs Assessment: post-procedure vital signs reviewed and stable Respiratory status: spontaneous breathing Cardiovascular status: stable Anesthetic complications: no    Last Vitals:  Filed Vitals:   07/07/15 0515 07/07/15 0817  BP:    Pulse:    Temp:    Resp: 18 16    Last Pain:  Filed Vitals:   07/07/15 0819  PainSc: 5                  EDWARDS,Plez [place]

## 2015-07-08 ENCOUNTER — Inpatient Hospital Stay (HOSPITAL_COMMUNITY): Payer: 59

## 2015-07-08 ENCOUNTER — Encounter (HOSPITAL_COMMUNITY): Payer: Self-pay | Admitting: Surgery

## 2015-07-08 MED ORDER — LABETALOL HCL 5 MG/ML IV SOLN
10.0000 mg | Freq: Four times a day (QID) | INTRAVENOUS | Status: DC | PRN
  Filled 2015-07-08 (×2): qty 4

## 2015-07-08 NOTE — Progress Notes (Signed)
3 Days Post-Op  Subjective: Tolerating liquids Complaining of right knee swelling and pain.  Has history of knee issues and had injury many years ago.  Swelling has occurred since admission  Objective: Vital signs in last 24 hours: Temp:  [98.7 F (37.1 C)-100.6 F (38.1 C)] 99.1 F (37.3 C) (04/11 0629) Pulse Rate:  [93-104] 93 (04/11 0629) Resp:  [6-18] 18 (04/11 0800) BP: (132-153)/(85-92) 135/87 mmHg (04/11 0629) SpO2:  [95 %-98 %] 96 % (04/11 0800) Last BM Date: 07/04/15  Intake/Output from previous day: 04/10 0701 - 04/11 0700 In: 3797.5 [P.O.:960; I.V.:2787.5; IV Piggyback:50] Out: 1695 [Urine:1175; Stool:520] Intake/Output this shift: Total I/O In: -  Out: 200 [Urine:200]   Lungs clear CV RRR Abdomen soft, wound clean, ostomy pink Right knee swollen with possible effusion, no erythema, mildly tender  Lab Results:   Recent Labs  07/06/15 0643 07/07/15 0536  WBC 10.6* 7.7  HGB 11.9* 10.8*  HCT 36.9* 33.4*  PLT 217 191   BMET  Recent Labs  07/06/15 0643 07/07/15 0536  NA 137 135  K 4.1 4.0  CL 104 101  CO2 22 26  GLUCOSE 130* 128*  BUN 12 7  CREATININE 1.17 0.99  CALCIUM 7.9* 7.8*   PT/INR No results for input(s): LABPROT, INR in the last 72 hours. ABG No results for input(s): PHART, HCO3 in the last 72 hours.  Invalid input(s): PCO2, PO2  Studies/Results: No results found.  Anti-infectives: Anti-infectives    Start     Dose/Rate Route Frequency Ordered Stop   07/05/15 0730  piperacillin-tazobactam (ZOSYN) IVPB 3.375 g     3.375 g 12.5 mL/hr over 240 Minutes Intravenous 3 times per day 07/05/15 0708     07/04/15 2345  piperacillin-tazobactam (ZOSYN) IVPB 3.375 g     3.375 g 100 mL/hr over 30 Minutes Intravenous  Once 07/04/15 2336 07/05/15 0018      Assessment/Plan: s/p Procedure(s): PARTIAL SIGMOID COLECTOMY AND COLOSTOMY (N/A)  Continue wound care and antibiotics Will check knee xray and consult orthopedic surgery for  opinion  LOS: 3 days    [place]uana Wank A 07/08/2015

## 2015-07-09 MED ORDER — IBUPROFEN 600 MG PO TABS
600.0000 mg | ORAL_TABLET | Freq: Three times a day (TID) | ORAL | Status: DC
  Administered 2015-07-09 – 2015-07-10 (×4): 600 mg via ORAL
  Filled 2015-07-09 (×4): qty 1

## 2015-07-09 MED ORDER — SODIUM CHLORIDE 0.9% FLUSH: 3.0000 mL | INTRAVENOUS | Status: DC | PRN

## 2015-07-09 MED ORDER — OXYCODONE-ACETAMINOPHEN 5-325 MG PO TABS
1.0000 | ORAL_TABLET | ORAL | Status: DC | PRN
  Administered 2015-07-09: 1 via ORAL
  Filled 2015-07-09: qty 1

## 2015-07-09 MED ORDER — MORPHINE SULFATE (PF) 2 MG/ML IV SOLN
2.0000 mg | INTRAVENOUS | Status: DC | PRN
  Administered 2015-07-09: 2 mg via INTRAVENOUS
  Filled 2015-07-09: qty 1

## 2015-07-09 MED ORDER — SODIUM CHLORIDE 0.9% FLUSH
3.0000 mL | Freq: Two times a day (BID) | INTRAVENOUS | Status: DC
  Administered 2015-07-09: 3 mL via INTRAVENOUS

## 2015-07-09 MED ORDER — DOCUSATE SODIUM 100 MG PO CAPS
100.0000 mg | ORAL_CAPSULE | Freq: Two times a day (BID) | ORAL | Status: DC
  Administered 2015-07-09 – 2015-07-10 (×3): 100 mg via ORAL
  Filled 2015-07-09 (×3): qty 1

## 2015-07-09 NOTE — Consult Note (Signed)
WOC ostomy consult note Stoma type/location: LLQ, end colostomy Stomal assessment/size: 2 1/4" round, budded, some mucosal sloughing at edges of stoma Peristomal assessment: MARSI at the distal edge of the tape border laterally Treatment options for stomal/peristomal skin: none Output: green, liquid Ostomy pouching: 2pc. 2 3/4" system  Education provided:  Extended session with patient and his daughter. Plans for DC tom.  This is the first time [facility] has seen patient.  Will ask bedside nurse to show him ostomy video this evening with his wife, she will come after 530Pm Demonstrated pouch change.  Patient practiced opening and closing pouch several time.  Demonstrated using wick to clean the bottom of the pouch after emptying and prior to closure. Demonstrated how to burp gas from 2pc pouch.  Explained creation of stoma and how stoma should appear (moist, pink).  Explained need to measure stoma about weekly with measuring guide.  Provided daughter and patient with education booklets, Denzil Hughes catelog with items marked he is currently using.  Monitor skin at lateral distal edge, medical adhesive related skin injury. Enrolled patient in [place] program: Yes, sample of two piece transparent and beige filtered pouches sent to home.    [place] team will follow along Para March RN,CWOCN [id number]

## 2015-07-09 NOTE — Consult Note (Signed)
   [facility] CM Inpatient Consult   07/09/2015  DARA LOMANTO [birth year] [id number]    Went to bedside to speak with patient about Link to [facility] Care Management program for [facility] employees/dependents with [facility] insurance. He reports his wife is a Furniture conservator/restorer. He denies any Link to Wellness needs and declines follow up post discharge telephone call. Link to [employer] and contact information left at bedside.   [name], MSN-Ed, RN,BSN [facility] Liaison [phone]

## 2015-07-09 NOTE — Care Management Note (Signed)
Case Management Note  Patient Details  Name: Miguel [name] MRN: [id number] Date of Birth: May 30, 1950  Subjective/Objective:  65 y.o. M admitted 07/04/2015 for Diverticulitis with abscess of Colon with Perforation requiring Hartman's Procedure. Pt will need HHRN for Wet to Dry dressing changes and Ostomy care which has begun here in the hospital.    Dressing changes have been Bid while hospitalized. Pt has chosen [facility] to provide his Home Health services and I have notified Manuela Schwartz, the liaison of his choice in anticipation of discharge Thursday 07/10/2015.              Action/Plan:  Anticipate discharge home Thursday 07/10/2015. No further CM needs but will be available should additional discharge needs arise.   Expected Discharge Date:                  Expected Discharge Plan:  [place]  In-House Referral:     Discharge planning Services  CM Consult  Post Acute Care Choice:    Choice offered to:  Patient, Adult Children  DME Arranged:    DME Agency:     HH Arranged:  RN [place] Agency:     Status of Service:  Completed, signed off  Medicare Important Message Given:    Date Medicare IM Given:    Medicare IM give by:    Date Additional Medicare IM Given:    Additional Medicare Important Message give by:     If discussed at [place] of Stay Meetings, dates discussed:    Additional Comments:  Delrae Sawyers, RN 07/09/2015, 3:55 PM

## 2015-07-09 NOTE — Progress Notes (Signed)
Patient ID: Miguel [name], male   DOB: [birth year], 65 y.o.   MRN: [id number]     [facility]      [address] [place] [place], [place] [id number]    Phone: [phone] FAX: [phone]     Subjective: No n/v.  Having ostomy function.  t max 100.5.  VSS.  Afebrile.  Objective:  Vital signs:  Filed Vitals:   07/09/15 0220 07/09/15 0413 07/09/15 0629 07/09/15 0735  BP: 145/91  147/87   Pulse: 95  88   Temp: 99.1 F (37.3 C)  99.1 F (37.3 C)   TempSrc:      Resp: 18 18 17 19   Height:      Weight:      SpO2: 98% 96% 98% 95%    Last BM Date: 07/04/15  Intake/Output   Yesterday:  04/11 0701 - 04/12 0700 In: 4167.5 [P.O.:2040; I.V.:1977.5; IV Piggyback:150] Out: 4410 [Urine:4035; Stool:375] This shift: I/O last 3 completed shifts: In: 5860 [P.O.:2220; I.V.:3440; IV Piggyback:200] Out: [id number] [Urine:4810; Stool:775]    Physical Exam: General: Pt awake/alert/oriented x4 in no acute distress Chest: cta. No chest wall pain w good excursion CV: Pulses intact. Regular rhythm Abdomen: Soft. Nondistended. +BS. Ostomy with air/stool, viable.  No evidence of peritonitis. No incarcerated hernias. Ext: SCDs BLE. No mjr edema. No cyanosis Skin: No petechiae / purpura    Problem List:   Active Problems:   Diverticulitis of colon with perforation    Results:   Labs: No results found for this or any previous visit (from the past 2 hour(s)).  Imaging / Studies: Dg Knee 1-2 Views Right  07/08/2015  CLINICAL DATA:  Right knee swelling EXAM: RIGHT KNEE - 1-2 VIEW COMPARISON:  None. FINDINGS: Advanced joint space narrowing and spurring in the lateral joint compartment. Mild spurring medially. Degenerative spurring of the patella. Calcification is present in the suprapatellar bursa and medial to the joint compatible with dystrophic calcification. Probable joint effusion. Advanced degenerative changes especially in the lateral  joint space. No fracture. IMPRESSION: Negative. Electronically Signed   By: Franchot Gallo M.D.   On: 07/08/2015 13:08    Medications / Allergies:  Scheduled Meds: . docusate sodium  100 mg Oral BID  . enoxaparin (LOVENOX) injection  40 mg Subcutaneous Q24H  . ibuprofen  600 mg Oral TID  . piperacillin-tazobactam (ZOSYN)  IV  3.375 g Intravenous 3 times per day   Continuous Infusions: . 0.9 % NaCl with KCl 20 mEq / L 75 mL/hr at 07/09/15 0100   PRN Meds:.labetalol, LORazepam, morphine injection, ondansetron **OR** ondansetron (ZOFRAN) IV, oxyCODONE-acetaminophen, zolpidem  Antibiotics: Anti-infectives    Start     Dose/Rate Route Frequency Ordered Stop   07/05/15 0730  piperacillin-tazobactam (ZOSYN) IVPB 3.375 g     3.375 g 12.5 mL/hr over 240 Minutes Intravenous 3 times per day 07/05/15 0708     07/04/15 2345  piperacillin-tazobactam (ZOSYN) IVPB 3.375 g     3.375 g 100 mL/hr over 30 Minutes Intravenous  Once 07/04/15 2336 07/05/15 0018       Assessment/Plan Perforated sigmoid diverticulitis POD#4 hartmann's procedure---Dr. Ninfa Linden -DC PCA, add PO pain meds, reduce IVF, HH for ostomy and dressing changes -BID Wet to dry dressing changes -pathology discussed, diverticulitis with abscess, no malignancy.  Right knee effusion-knee xr okay, able to bear weight, add NSAID, can see ortho OP basis if needed   ID-zosyn D#4/7 VTE prophylaxis-SCD/lovenox Dispo-anticipate DC in AM.  HH ordered.  Erby Pian, [facility] Surgery Pager [phone]) For consults and floor pages call [phone](7A-4:30P)   07/09/2015 8:10 AM

## 2015-07-10 LAB — CBC
HEMATOCRIT: 33.2 % — AB (ref 39.0–52.0)
Hemoglobin: 11.5 g/dL — ABNORMAL LOW (ref 13.0–17.0)
MCH: 30.6 pg (ref 26.0–34.0)
MCHC: 34.6 g/dL (ref 30.0–36.0)
MCV: 88.3 fL (ref 78.0–100.0)
PLATELETS: 318 10*3/uL (ref 150–400)
RBC: 3.76 MIL/uL — ABNORMAL LOW (ref 4.22–5.81)
RDW: 13.2 % (ref 11.5–15.5)
WBC: 10.3 10*3/uL (ref 4.0–10.5)

## 2015-07-10 LAB — BASIC METABOLIC PANEL
ANION GAP: 12 (ref 5–15)
BUN: 8 mg/dL (ref 6–20)
CALCIUM: 8.3 mg/dL — AB (ref 8.9–10.3)
CO2: 27 mmol/L (ref 22–32)
CREATININE: 1.05 mg/dL (ref 0.61–1.24)
Chloride: 98 mmol/L — ABNORMAL LOW (ref 101–111)
Glucose, Bld: 111 mg/dL — ABNORMAL HIGH (ref 65–99)
Potassium: 4 mmol/L (ref 3.5–5.1)
SODIUM: 137 mmol/L (ref 135–145)

## 2015-07-10 MED ORDER — HYDROCODONE-ACETAMINOPHEN 5-325 MG PO TABS
2.0000 | ORAL_TABLET | Freq: Once | ORAL | Status: AC
Start: 2015-07-10 — End: 2015-07-10
  Administered 2015-07-10: 2 via ORAL

## 2015-07-10 MED ORDER — SULFAMETHOXAZOLE-TRIMETHOPRIM 800-160 MG PO TABS: 1.0000 | ORAL_TABLET | Freq: Two times a day (BID) | ORAL | Status: DC

## 2015-07-10 NOTE — Discharge Summary (Signed)
Physician Discharge Summary  Miguel [name] [id number] DOB: Mar 22, 1951 DOA: 07/04/2015  PCP: Donnie Coffin, MD  Consultation: WOC  Admit date: 07/04/2015 Discharge date: 07/10/2015  Recommendations for Outpatient Follow-up:   Follow-up Information    Follow up with [facility].   Why:  HHRN will be provided to assist with Ostomy care and Dressing changes. A representative will make contact with you prior to discharge to arrange initial  home visit.   Contact information:   [address] [place] [address] [phone]       Schedule an appointment as soon as possible for a visit with Miguel Bowie, MD.   Specialty:  General Surgery   Why:  For wound re-check   Contact information:   [place] [address] [phone]      Discharge Diagnoses:  1. Perforated sigmoid diverticulitis    Surgical Procedure: hartmann's procedure---Dr. Ninfa Linden  Discharge Condition: stable Disposition: home  Diet recommendation: regular  Filed Weights   07/04/15 2037 07/05/15 0329 07/05/15 0702  Weight: 78.019 kg (172 lb) 78.019 kg (172 lb) 78.1 kg (172 lb 2.9 oz)       Hospital Course:  [name] was transferred from [facility] with history of abdominal pain and CT of a/p revealing intraperitoneal free air from sigmoid diverticulitis.  He underwent the surgery listed above which he tolerated well and was transferred to the floor.  Kept on IV antibiotics.  Diet advanced as ileus resolved.  Transitioned to oral pain medication.  Maintained on SCDs and lovenox.  WOC was consulted for teaching.  On POD#5 he was felt stable for discharge home.  Medication risks, benefits and therapeutic alternatives were reviewed with the patient.  He verbalizes understanding.  He was encouraged to call with questions or concerns.  Follow up in 2-3 weeks with Dr. Ninfa Linden  Physical Exam: General: Pt awake/alert/oriented x4 in no acute distress  Abdomen: Soft.  Nondistended. +BS. Ostomy with air/stool, viable. No evidence of peritonitis. No incarcerated hernias.   Discharge Instructions     Medication List    STOP taking these medications        HYDROcodone-acetaminophen 5-325 MG tablet  Commonly known as:  NORCO      TAKE these medications        acetaminophen 500 MG tablet  Commonly known as:  TYLENOL  Take 500-1,000 mg by mouth every 6 (six) hours as needed (pain).     aspirin EC 81 MG tablet  Take 81 mg by mouth daily with supper.     Fish Oil 1200 MG Caps  Take 2,400 mg by mouth daily with supper.     meloxicam 7.5 MG tablet  Commonly known as:  MOBIC  Take 3.75 mg by mouth 2 (two) times daily as needed for pain.     multivitamin with minerals Tabs tablet  Take 1 tablet by mouth daily with supper.     RED YEAST RICE PO  Take 2 tablets by mouth daily with supper.     sulfamethoxazole-trimethoprim 800-160 MG tablet  Commonly known as:  BACTRIM DS,SEPTRA DS  Take 1 tablet by mouth 2 (two) times daily.     zolpidem 10 MG tablet  Commonly known as:  AMBIEN  Take 10 mg by mouth at bedtime as needed for sleep.           Follow-up Information    Follow up with [facility].   Why:  HHRN will be provided  to assist with Ostomy care and Dressing changes. A representative will make contact with you prior to discharge to arrange initial  home visit.   Contact information:   [address] [place] [address] [phone]       Schedule an appointment as soon as possible for a visit with Miguel Bowie, MD.   Specialty:  General Surgery   Why:  For wound re-check   Contact information:   [place] [address] [phone]        The results of significant diagnostics from this hospitalization (including imaging, microbiology, ancillary and laboratory) are listed below for reference.    Significant Diagnostic Studies: Dg Knee 1-2 Views Right  07/08/2015   CLINICAL DATA:  Right knee swelling EXAM: RIGHT KNEE - 1-2 VIEW COMPARISON:  None. FINDINGS: Advanced joint space narrowing and spurring in the lateral joint compartment. Mild spurring medially. Degenerative spurring of the patella. Calcification is present in the suprapatellar bursa and medial to the joint compatible with dystrophic calcification. Probable joint effusion. Advanced degenerative changes especially in the lateral joint space. No fracture. IMPRESSION: Negative. Electronically Signed   By: Franchot Gallo M.D.   On: 07/08/2015 13:08   Ct Abdomen Pelvis W Contrast  07/04/2015  CLINICAL DATA:  Epigastric and periumbilical pain for 2 days. Febrile. Leukocytosis. EXAM: CT ABDOMEN AND PELVIS WITH CONTRAST TECHNIQUE: Multidetector CT imaging of the abdomen and pelvis was performed using the standard protocol following bolus administration of intravenous contrast. CONTRAST:  174mL ISOVUE-300 IOPAMIDOL (ISOVUE-300) INJECTION 61% COMPARISON:  11/03/2013 FINDINGS: There is a moderate volume free intraperitoneal air. This likely originates from a perforated proximal sigmoid diverticulitis. There is intense inflammatory change surrounding this portion of the sigmoid. No drainable abscess. There is mild dilatation of small bowel without caliber transition, perhaps reactive. A discrete small bowel lesion or focal small bowel inflammation is not evident. Appendix is normal. Stomach is nearly completely herniated into the chest but is unobstructed and not inflamed. The liver is remarkable only for a 10 mm hypodensity adjacent to the gallbladder fossa, most likely a benign cyst. The gallbladder and bile ducts are unremarkable. The pancreas is normal. The spleen is normal. The adrenals are normal. There is a 2.4 cm cyst of the right renal upper pole. There are 2 left lower pole collecting system calculi measuring up to 4 mm. No ureteral calculi. There are multiple small calculus fragments within the urinary bladder  lumen, only about 2 mm each. Otherwise unremarkable appearances of the urinary bladder. There is prominent prostatic enlargement. The abdominal aorta is normal in caliber. There is mild atherosclerotic calcification. There is no adenopathy in the abdomen or pelvis. There is no significant abnormality in the lower chest except for the large hiatal hernia. There is no significant skeletal lesion. IMPRESSION: 1. Free intraperitoneal air. This probably originates from a perforated sigmoid diverticulitis. 2. Large hiatal hernia, containing nearly the entire stomach. No evidence of obstruction or incarceration within the hernia. 3. Left nephrolithiasis. Several tiny calculus fragments within the urinary bladder lumen. No ureteral calculi. 4. These results were called by telephone at the time of interpretation on 07/04/2015 at 11:33 pm to Dr. Dolly Rias, who verbally acknowledged these results. Electronically Signed   By: Andreas Newport M.D.   On: 07/04/2015 23:35    Microbiology: No results found for this or any previous visit (from the past 240 hour(s)).   Labs: Basic Metabolic Panel:  Recent Labs Lab 07/04/15 2144 07/06/15 [id number]  07/07/15 0536 07/10/15 0545  NA 137 137 135 137  K 4.5 4.1 4.0 4.0  CL 104 104 101 98*  CO2 24 22 26 27   GLUCOSE 115* 130* 128* 111*  BUN 18 12 7 8   CREATININE 1.06 1.17 0.99 1.05  CALCIUM 8.8* 7.9* 7.8* 8.3*   Liver Function Tests:  Recent Labs Lab 07/04/15 2144  AST 22  ALT 25  ALKPHOS 59  BILITOT 1.0  PROT 7.4  ALBUMIN 4.2   No results for input(s): LIPASE, AMYLASE in the last 168 hours. No results for input(s): AMMONIA in the last 168 hours. CBC:  Recent Labs Lab 07/04/15 2144 07/06/15 0643 07/07/15 0536 07/10/15 0545  WBC 14.8* 10.6* 7.7 10.3  NEUTROABS 12.4*  --   --   --   HGB 13.7 11.9* 10.8* 11.5*  HCT 40.6 36.9* 33.4* 33.2*  MCV 90.2 91.3 90.5 88.3  PLT 217 217 191 318   Cardiac Enzymes: No results for input(s): CKTOTAL, CKMB,  CKMBINDEX, TROPONINI in the last 168 hours. BNP: BNP (last 3 results) No results for input(s): BNP in the last 8760 hours.  ProBNP (last 3 results) No results for input(s): PROBNP in the last 8760 hours.  CBG: No results for input(s): GLUCAP in the last 168 hours.  Active Problems:   Diverticulitis of colon with perforation   Time coordinating discharge: <30 mins   Signed:  Shilah Hefel, ANP-BC

## 2015-07-10 NOTE — Progress Notes (Signed)
Miguel [name] to be D/C'd  per MD order. Discussed with the patient and all questions fully answered.  VSS, Skin clean, dry and intact without evidence of skin break down, no evidence of skin tears noted.  IV catheter discontinued intact. Site without signs and symptoms of complications. Dressing and pressure applied.  Patient demonstrated emptying ostomy bag.  An After Visit Summary was printed and given to the patient. Patient received prescription.  D/c education completed with patient/family including follow up instructions, medication list, d/c activities limitations if indicated, with other d/c instructions as indicated by MD - patient able to verbalize understanding, all questions fully answered.   Patient instructed to return to ED, call 911, or call MD for any changes in condition.   Patient to be escorted via [place], and D/C home via private auto.

## 2015-07-10 NOTE — Discharge Instructions (Signed)

## 2015-07-11 DIAGNOSIS — K5720 Diverticulitis of large intestine with perforation and abscess without bleeding: Secondary | ICD-10-CM | POA: Diagnosis not present

## 2015-07-11 DIAGNOSIS — Z433 Encounter for attention to colostomy: Secondary | ICD-10-CM | POA: Diagnosis not present

## 2015-07-11 DIAGNOSIS — Z48815 Encounter for surgical aftercare following surgery on the digestive system: Secondary | ICD-10-CM | POA: Diagnosis not present

## 2015-07-14 DIAGNOSIS — Z48815 Encounter for surgical aftercare following surgery on the digestive system: Secondary | ICD-10-CM | POA: Diagnosis not present

## 2015-07-14 DIAGNOSIS — Z433 Encounter for attention to colostomy: Secondary | ICD-10-CM | POA: Diagnosis not present

## 2015-07-14 DIAGNOSIS — K5720 Diverticulitis of large intestine with perforation and abscess without bleeding: Secondary | ICD-10-CM | POA: Diagnosis not present

## 2015-07-15 DIAGNOSIS — Z933 Colostomy status: Secondary | ICD-10-CM | POA: Diagnosis not present

## 2015-07-16 DIAGNOSIS — Z48815 Encounter for surgical aftercare following surgery on the digestive system: Secondary | ICD-10-CM | POA: Diagnosis not present

## 2015-07-16 DIAGNOSIS — Z433 Encounter for attention to colostomy: Secondary | ICD-10-CM | POA: Diagnosis not present

## 2015-07-16 DIAGNOSIS — K5720 Diverticulitis of large intestine with perforation and abscess without bleeding: Secondary | ICD-10-CM | POA: Diagnosis not present

## 2015-07-18 DIAGNOSIS — K5720 Diverticulitis of large intestine with perforation and abscess without bleeding: Secondary | ICD-10-CM | POA: Diagnosis not present

## 2015-07-18 DIAGNOSIS — Z433 Encounter for attention to colostomy: Secondary | ICD-10-CM | POA: Diagnosis not present

## 2015-07-18 DIAGNOSIS — Z48815 Encounter for surgical aftercare following surgery on the digestive system: Secondary | ICD-10-CM | POA: Diagnosis not present

## 2015-07-22 DIAGNOSIS — Z433 Encounter for attention to colostomy: Secondary | ICD-10-CM | POA: Diagnosis not present

## 2015-07-22 DIAGNOSIS — Z48815 Encounter for surgical aftercare following surgery on the digestive system: Secondary | ICD-10-CM | POA: Diagnosis not present

## 2015-07-22 DIAGNOSIS — K5720 Diverticulitis of large intestine with perforation and abscess without bleeding: Secondary | ICD-10-CM | POA: Diagnosis not present

## 2015-07-22 DIAGNOSIS — Z933 Colostomy status: Secondary | ICD-10-CM | POA: Diagnosis not present

## 2015-07-23 DIAGNOSIS — Z933 Colostomy status: Secondary | ICD-10-CM | POA: Diagnosis not present

## 2015-07-24 DIAGNOSIS — Z433 Encounter for attention to colostomy: Secondary | ICD-10-CM | POA: Diagnosis not present

## 2015-07-24 DIAGNOSIS — K5720 Diverticulitis of large intestine with perforation and abscess without bleeding: Secondary | ICD-10-CM | POA: Diagnosis not present

## 2015-07-24 DIAGNOSIS — Z48815 Encounter for surgical aftercare following surgery on the digestive system: Secondary | ICD-10-CM | POA: Diagnosis not present

## 2015-08-01 DIAGNOSIS — Z933 Colostomy status: Secondary | ICD-10-CM | POA: Diagnosis not present

## 2015-08-07 DIAGNOSIS — Z933 Colostomy status: Secondary | ICD-10-CM | POA: Diagnosis not present

## 2015-08-07 DIAGNOSIS — H52222 Regular astigmatism, left eye: Secondary | ICD-10-CM | POA: Diagnosis not present

## 2015-08-07 DIAGNOSIS — H524 Presbyopia: Secondary | ICD-10-CM | POA: Diagnosis not present

## 2015-09-01 DIAGNOSIS — K5720 Diverticulitis of large intestine with perforation and abscess without bleeding: Secondary | ICD-10-CM | POA: Diagnosis not present

## 2015-09-03 ENCOUNTER — Other Ambulatory Visit: Payer: Self-pay | Admitting: Surgery

## 2015-09-03 DIAGNOSIS — I10 Essential (primary) hypertension: Secondary | ICD-10-CM | POA: Diagnosis not present

## 2015-09-03 DIAGNOSIS — M25512 Pain in left shoulder: Secondary | ICD-10-CM | POA: Diagnosis not present

## 2015-10-02 DIAGNOSIS — M7552 Bursitis of left shoulder: Secondary | ICD-10-CM | POA: Diagnosis not present

## 2015-10-08 ENCOUNTER — Other Ambulatory Visit: Payer: Self-pay | Admitting: Surgery

## 2015-10-11 ENCOUNTER — Emergency Department (HOSPITAL_COMMUNITY)
Admission: EM | Admit: 2015-10-11 | Discharge: 2015-10-11 | Disposition: A | Discharge status: Home / Self Care | Payer: 59 | Attending: Emergency Medicine | Admitting: Emergency Medicine

## 2015-10-11 ENCOUNTER — Emergency Department (HOSPITAL_COMMUNITY): Payer: 59

## 2015-10-11 ENCOUNTER — Encounter (HOSPITAL_COMMUNITY): Payer: Self-pay | Admitting: Emergency Medicine

## 2015-10-11 DIAGNOSIS — R079 Chest pain, unspecified: Secondary | ICD-10-CM | POA: Insufficient documentation

## 2015-10-11 DIAGNOSIS — M549 Dorsalgia, unspecified: Secondary | ICD-10-CM | POA: Diagnosis present

## 2015-10-11 DIAGNOSIS — E785 Hyperlipidemia, unspecified: Secondary | ICD-10-CM | POA: Insufficient documentation

## 2015-10-11 DIAGNOSIS — Z79899 Other long term (current) drug therapy: Secondary | ICD-10-CM | POA: Diagnosis not present

## 2015-10-11 DIAGNOSIS — Z7982 Long term (current) use of aspirin: Secondary | ICD-10-CM | POA: Insufficient documentation

## 2015-10-11 DIAGNOSIS — M546 Pain in thoracic spine: Secondary | ICD-10-CM | POA: Insufficient documentation

## 2015-10-11 DIAGNOSIS — R52 Pain, unspecified: Secondary | ICD-10-CM

## 2015-10-11 DIAGNOSIS — N189 Chronic kidney disease, unspecified: Secondary | ICD-10-CM | POA: Insufficient documentation

## 2015-10-11 DIAGNOSIS — Z85828 Personal history of other malignant neoplasm of skin: Secondary | ICD-10-CM | POA: Diagnosis not present

## 2015-10-11 DIAGNOSIS — R05 Cough: Secondary | ICD-10-CM | POA: Diagnosis not present

## 2015-10-11 LAB — BASIC METABOLIC PANEL
Anion gap: 9 (ref 5–15)
BUN: 14 mg/dL (ref 6–20)
CHLORIDE: 100 mmol/L — AB (ref 101–111)
CO2: 30 mmol/L (ref 22–32)
CREATININE: 0.98 mg/dL (ref 0.61–1.24)
Calcium: 9.2 mg/dL (ref 8.9–10.3)
Glucose, Bld: 115 mg/dL — ABNORMAL HIGH (ref 65–99)
POTASSIUM: 3.8 mmol/L (ref 3.5–5.1)
SODIUM: 139 mmol/L (ref 135–145)

## 2015-10-11 LAB — CBC
HEMATOCRIT: 41.7 % (ref 39.0–52.0)
Hemoglobin: 14.3 g/dL (ref 13.0–17.0)
MCH: 30.4 pg (ref 26.0–34.0)
MCHC: 34.3 g/dL (ref 30.0–36.0)
MCV: 88.7 fL (ref 78.0–100.0)
PLATELETS: 268 10*3/uL (ref 150–400)
RBC: 4.7 MIL/uL (ref 4.22–5.81)
RDW: 13.1 % (ref 11.5–15.5)
WBC: 9.6 10*3/uL (ref 4.0–10.5)

## 2015-10-11 LAB — URINALYSIS, ROUTINE W REFLEX MICROSCOPIC
BILIRUBIN URINE: NEGATIVE
Glucose, UA: NEGATIVE mg/dL
HGB URINE DIPSTICK: NEGATIVE
Ketones, ur: NEGATIVE mg/dL
Leukocytes, UA: NEGATIVE
NITRITE: NEGATIVE
PROTEIN: NEGATIVE mg/dL
SPECIFIC GRAVITY, URINE: 1.011 (ref 1.005–1.030)
pH: 6 (ref 5.0–8.0)

## 2015-10-11 LAB — I-STAT TROPONIN, ED: Troponin i, poc: 0 ng/mL (ref 0.00–0.08)

## 2015-10-11 MED ORDER — OXYCODONE-ACETAMINOPHEN 5-325 MG PO TABS: 1.0000 | ORAL_TABLET | ORAL | Status: DC | PRN

## 2015-10-11 MED ORDER — HYDROMORPHONE HCL 1 MG/ML IJ SOLN
1.0000 mg | Freq: Once | INTRAMUSCULAR | Status: AC
  Administered 2015-10-11: 1 mg via INTRAMUSCULAR
  Filled 2015-10-11: qty 1

## 2015-10-11 MED ORDER — KETOROLAC TROMETHAMINE 60 MG/2ML IM SOLN
30.0000 mg | Freq: Once | INTRAMUSCULAR | Status: AC
  Administered 2015-10-11: 30 mg via INTRAMUSCULAR
  Filled 2015-10-11: qty 2

## 2015-10-11 MED ORDER — DIAZEPAM 2 MG PO TABS: 2.0000 mg | ORAL_TABLET | Freq: Four times a day (QID) | ORAL | Status: DC | PRN

## 2015-10-11 MED ORDER — DIAZEPAM 5 MG PO TABS
5.0000 mg | ORAL_TABLET | Freq: Once | ORAL | Status: AC
  Administered 2015-10-11: 5 mg via ORAL
  Filled 2015-10-11: qty 1

## 2015-10-11 NOTE — ED Notes (Signed)
PT DISCHARGED. INSTRUCTIONS AND PRESCRIPTIONS GIVEN. AAOX4. PT IN NO APPARENT DISTRESS. THE OPPORTUNITY TO ASK QUESTIONS WAS PROVIDED. 

## 2015-10-11 NOTE — ED Notes (Addendum)
Pt complaint of mid back pain for a week and new onset central chest pain onset en route to ED. Pt denies associated symptoms but reports frequency urinating.

## 2015-10-11 NOTE — ED Provider Notes (Signed)
CSN: [id number]     Arrival date & time 10/11/15  1217 History   First MD Initiated Contact with Patient 10/11/15 1435     Chief Complaint  Patient presents with  . Chest Pain  . Back Pain     (Consider location/radiation/quality/duration/timing/severity/associated sxs/prior Treatment) HPI Comments: 65 year old male presents with worsening thoracic back pain. Pain is localized to his mid spine characterized as sharp and positional. Has had some urinary frequency but denies any dysuria or hematuria. No cough or congestion. No dyspnea. Denies any rashes to his back. No neurological features. Has used Ultram without relief. States that sometimes the back pain seems to radiate down his arm and into his chest but there is no exertional component to this. No associated dyspnea or diaphoresis. Denies any history of trauma. Does have a history of prior skin cancer and had a resection in the same location where he has his current back pain. Denies any recent weight loss or night sweats  Patient is a 65 y.o. male presenting with chest pain and back pain. The history is provided by the patient.  Chest Pain Associated symptoms: back pain   Back Pain Associated symptoms: chest pain     Past Medical History  Diagnosis Date  . Anemia   . Cancer ([facility])   . Chronic kidney disease     KIDNEY STONE  . Hyperlipidemia    Past Surgical History  Procedure Laterality Date  . Knee surgery  1971  . Skin cancer removed  2001/2009  . Laparotomy N/A 07/05/2015    Procedure: PARTIAL SIGMOID COLECTOMY AND COLOSTOMY;  Surgeon: [name], MD;  Location: [facility] OR;  Service: General;  Laterality: N/A;   Family History  Problem Relation Age of Onset  . Stroke Mother   . Stroke Brother   . Heart disease Brother    Social History  Substance Use Topics  . Smoking status: Never Smoker   . Smokeless tobacco: Never Used  . Alcohol Use: No    Review of Systems  Cardiovascular: Positive for chest pain.    Musculoskeletal: Positive for back pain.  All other systems reviewed and are negative.     Allergies  Review of patient's allergies indicates no known allergies.  Home Medications   Prior to Admission medications   Medication Sig Start Date End Date Taking? Authorizing Provider  acetaminophen (TYLENOL) 500 MG tablet Take 500-1,000 mg by mouth every 6 (six) hours as needed (pain).     Historical Provider, MD  aspirin EC 81 MG tablet Take 81 mg by mouth daily with supper.     Historical Provider, MD  meloxicam (MOBIC) 7.5 MG tablet Take 3.75 mg by mouth 2 (two) times daily as needed for pain.     Historical Provider, MD  Multiple Vitamin (MULTIVITAMIN WITH MINERALS) TABS tablet Take 1 tablet by mouth daily with supper.    Historical Provider, MD  Omega-3 Fatty Acids (FISH OIL) 1200 MG CAPS Take 2,400 mg by mouth daily with supper.     Historical Provider, MD  Red Yeast Rice Extract (RED YEAST RICE PO) Take 2 tablets by mouth daily with supper.     Historical Provider, MD  sulfamethoxazole-trimethoprim (BACTRIM DS,SEPTRA DS) 800-160 MG tablet Take 1 tablet by mouth 2 (two) times daily. 07/10/15   Emina Riebock, NP  zolpidem (AMBIEN) 10 MG tablet Take 10 mg by mouth at bedtime as needed for sleep.     Historical Provider, MD   BP 151/80 mmHg  Pulse 92  Temp(Src) 98.3 F (36.8 C) (Oral)  Resp 18  SpO2 97% Physical Exam  Constitutional: He is oriented to person, place, and time. He appears well-developed and well-nourished.  Non-toxic appearance. No distress.  HENT:  Head: Normocephalic and atraumatic.  Eyes: Conjunctivae, EOM and lids are normal. Pupils are equal, round, and reactive to light.  Neck: Normal range of motion. Neck supple. No tracheal deviation present. No thyroid mass present.  Cardiovascular: Normal rate, regular rhythm and normal heart sounds.  Exam reveals no gallop.   No murmur heard. Pulmonary/Chest: Effort normal and breath sounds normal. No stridor. No  respiratory distress. He has no decreased breath sounds. He has no wheezes. He has no rhonchi. He has no rales.  Abdominal: Soft. Normal appearance and bowel sounds are normal. He exhibits no distension. There is no tenderness. There is no rebound and no CVA tenderness.  Musculoskeletal: Normal range of motion. He exhibits no edema or tenderness.       Back:  Neurological: He is alert and oriented to person, place, and time. He has normal strength. No cranial nerve deficit or sensory deficit. GCS eye subscore is 4. GCS verbal subscore is 5. GCS motor subscore is 6.  Skin: Skin is warm and dry. No abrasion and no rash noted.  Psychiatric: He has a normal mood and affect. His speech is normal and behavior is normal.  Nursing note and vitals reviewed.   ED Course  Procedures (including critical care time) Labs Review Labs Reviewed  BASIC METABOLIC PANEL - Abnormal; Notable for the following:    Chloride 100 (*)    Glucose, Bld 115 (*)    All other components within normal limits  CBC  URINALYSIS, ROUTINE W REFLEX MICROSCOPIC (NOT AT [facility])  Randolm Idol, ED    Imaging Review Dg Chest 2 View  10/11/2015  CLINICAL DATA:  Back pain for a week with new onset cough. EXAM: CHEST  2 VIEW COMPARISON:  06/20/2009 FINDINGS: The lungs are clear wiithout focal pneumonia, edema, pneumothorax or pleural effusion. Telemetry pad over bilateral lungs. The cardiopericardial silhouette is within normal limits for size. The visualized bony structures of the thorax are intact. Hiatal hernia noted. IMPRESSION: No active cardiopulmonary disease. Electronically Signed   By: Misty Stanley M.D.   On: 10/11/2015 13:38   I have personally reviewed and evaluated these images and lab results as part of my medical decision-making.   EKG Interpretation   Date/Time:  Saturday October 11 2015 12:25:50 EDT Ventricular Rate:  97 PR Interval:    QRS Duration: 87 QT Interval:  356 QTC Calculation: 453 R Axis:    -86 Text Interpretation:  Sinus rhythm Probable left atrial enlargement RSR'  in V1 or V2, right VCD or RVH Confirmed by Jeneen Rinks  MD, [place] ([id number]) on  10/11/2015 2:16:00 PM      MDM   Final diagnoses:  None    Patient medicated here and that has improved. X-rays are reassuring. Be discharged home    Lacretia Leigh, MD 10/11/15 [phone]

## 2015-10-11 NOTE — Discharge Instructions (Signed)

## 2015-10-11 NOTE — ED Notes (Signed)
Pt cannot use restroom at this time, aware urine specimen is needed.  

## 2015-10-15 DIAGNOSIS — M549 Dorsalgia, unspecified: Secondary | ICD-10-CM | POA: Diagnosis not present

## 2015-10-15 DIAGNOSIS — I10 Essential (primary) hypertension: Secondary | ICD-10-CM | POA: Diagnosis not present

## 2015-10-15 DIAGNOSIS — R112 Nausea with vomiting, unspecified: Secondary | ICD-10-CM | POA: Diagnosis not present

## 2015-10-15 DIAGNOSIS — L0390 Cellulitis, unspecified: Secondary | ICD-10-CM | POA: Diagnosis not present

## 2015-10-22 DIAGNOSIS — M546 Pain in thoracic spine: Secondary | ICD-10-CM | POA: Diagnosis not present

## 2015-10-23 ENCOUNTER — Encounter: Payer: Self-pay | Admitting: Physical Therapy

## 2015-10-23 ENCOUNTER — Ambulatory Visit: Payer: 59 | Attending: Physician Assistant | Admitting: Physical Therapy

## 2015-10-23 DIAGNOSIS — R293 Abnormal posture: Secondary | ICD-10-CM

## 2015-10-23 DIAGNOSIS — M546 Pain in thoracic spine: Secondary | ICD-10-CM

## 2015-10-23 NOTE — Therapy (Addendum)
[place], [name], [address] Phone: [phone]   Fax:  [phone]  Physical Therapy Evaluation/Discharge Summary  Patient Details  Name: ADMIR CANDELAS MRN: [id number] Date of Birth: [birth year] Referring Provider: Pete Pelt PA-C  Encounter Date: 10/23/2015      PT End of Session - 10/23/15 0815    Visit Number 1   Number of Visits 5   Date for PT Re-Evaluation 11/06/15  pt will be having surgery for another impairment   PT Start Time 0814  pt arrrived late   PT Stop Time 0845   PT Time Calculation (min) 31 min   Activity Tolerance Patient tolerated treatment well   Behavior During Therapy [facility] for tasks assessed/performed      Past Medical History:  Diagnosis Date  . Anemia   . Cancer ([place])   . Chronic kidney disease    KIDNEY STONE  . Hyperlipidemia     Past Surgical History:  Procedure Laterality Date  . KNEE SURGERY  1971  . LAPAROTOMY N/A 07/05/2015   Procedure: PARTIAL SIGMOID COLECTOMY AND COLOSTOMY;  Surgeon: [name], MD;  Location: [place];  Service: General;  Laterality: N/A;  . SKIN CANCER REMOVED  2001/2009    There were no vitals filed for this visit.       Subjective Assessment - 10/23/15 0815    Subjective April 2017 had sugergy and began having discomfort. Had moving business for 23 years. Had a couple of injections which decreased pain for a short time. Sore on back where pain is which pt reports is from heating pad. Pain wrapping around ribs and discomfort with deep breath, mostly on L.    How long can you sit comfortably? 10-15 min   How long can you stand comfortably? 10-15 min   How long can you walk comfortably? 10-15 min   Patient Stated Goals decrease pain   Currently in Pain? Yes   Pain Score 4    Pain Location Back   Pain Orientation Mid   Pain Descriptors / Indicators Nagging   Pain Radiating Towards anterior along ribs   Pain Onset More than  a month ago   Pain Frequency Intermittent   Aggravating Factors  standing   Pain Relieving Factors heat            [facility] PT Assessment - 10/23/15 0001      Assessment   Medical Diagnosis thoracic spine pain   Referring Provider Pete Pelt PA-C   Hand Dominance Right   Next MD Visit unknown   Prior Therapy no     Precautions   Precautions None     Restrictions   Weight Bearing Restrictions No     Balance Screen   Has the patient fallen in the past 6 months No     [place] residence   Living Arrangements Spouse/significant other     Prior Function   Level of Independence Independent     Cognition   Overall Cognitive Status Within Functional Limits for tasks assessed     Observation/Other Assessments   Skin Integrity wound on L thoracic region approx T6 level: 2 cm 12-6, 1cm 9-3, slough in wound bed surrounded by redness, no oozing noted   Focus on Therapeutic Outcomes (FOTO)  next visit due to computer difficulty     Posture/Postural Control   Posture Comments R GHJ depression with bilat forward rounded shoulders, pt sits in  slouched position but has flat thoracic spine     ROM / Strength   AROM / PROM / Strength Strength     Strength   Strength Assessment Site Shoulder   Right/Left Shoulder Right;Left   Right Shoulder External Rotation 4/5   Left Shoulder External Rotation 4/5     Palpation   Palpation comment TTP in periscapular musculature                   [facility] Adult PT Treatment/Exercise - 10/23/15 0001      Exercises   Exercises Shoulder     Shoulder Exercises: Seated   Retraction 20 reps   External Rotation 20 reps   Theraband Level (Shoulder External Rotation) Level 2 (Red)     Shoulder Exercises: Stretch   Other Shoulder Stretches door pec stretch                PT Education - 10/23/15 1308    Education provided Yes   Education Details anatomy of condition, POC, HEP exercise  form/rationale   Person(s) Educated Patient   Methods Explanation;Demonstration;Tactile cues;Verbal cues;Handout   Comprehension Verbalized understanding;Returned demonstration;Verbal cues required;Tactile cues required;Need further instruction          PT Short Term Goals - 10/23/15 1314      PT SHORT TERM GOAL #1   Title Pt will be indepenent in HEP as it has been established by 8/10   Time 2   Period Weeks   Status New     PT SHORT TERM GOAL #2   Title Pt will verbalize improvement in postural awareness with functional activities   Time 2   Period Weeks   Status New     PT SHORT TERM GOAL #3   Title Pt will be able to sit for at least 20 min pain <=4/10   Time 2   Period Weeks   Status New                  Plan - 10/23/15 1310    Clinical Impression Statement Pt presents to physical therapy with complaints of thoracic spine pain that he has had for many years but recently got worse following colostomy bag placement. Pt was educated in importance of posture to relieve thoracic spine pain and provided with 3 beginning exercises to strengthen posture. Pt has wound on thoracic region that he reports is from being on a heating pad too much. Was instructed to talk to pre-op nurse about it when he goes back for surgery in 2 weeks. Pt will benefit from skilled PT in order to improve postural strength and endurance as well as awareness. Will determine discharge or further care closer to surgery date based on pt need.    Rehab Potential Fair   PT Frequency 2x / week   PT Duration 2 weeks   PT Treatment/Interventions ADLs/Self Care Home Management;Functional mobility training;Therapeutic activities;Therapeutic exercise;Neuromuscular re-education;Patient/family education;Passive range of motion;Manual techniques;Taping   PT Next Visit Plan UBE, periscapular strength, pec +subscap stretching; FOTO & check T-spine wound   PT Home Exercise Plan door stretch,    Consulted and  Agree with Plan of Care Patient      Patient will benefit from skilled therapeutic intervention in order to improve the following deficits and impairments:  Pain, Decreased activity tolerance, Improper body mechanics, Postural dysfunction, Decreased strength, Decreased skin integrity  Visit Diagnosis: Pain in thoracic spine - Plan: PT plan of care cert/re-cert  Abnormal posture - Plan:  PT plan of care cert/re-cert     Problem List Patient Active Problem List   Diagnosis Date Noted  . Diverticulitis of colon with perforation 07/05/2015    Gracieann Stannard C. Sanchez Hemmer PT, DPT 10/23/15 1:20 PM   [facility] [address] [place], [name], [address] Phone: [phone]   Fax:  [phone]  Name: TAMARA KENYON MRN: [id number] Date of Birth: 29-Mar-1951  PHYSICAL THERAPY DISCHARGE SUMMARY  Visits from Start of Care: 1   Current functional level related to goals / functional outcomes: See above   Remaining deficits: See above   Education / Equipment: Anatomy of condition, POC, HEP, exercise form/rationale  Plan: Patient agrees to discharge.  Patient goals were not met. Patient is being discharged due to a change in medical status.  ?????  Daden Mahany C. Jorey Dollard PT, DPT 12/02/15 1:07 PM

## 2015-10-23 NOTE — Patient Instructions (Signed)
Access Code: [email or web address]  URL: [email or web address]  Date: 10/23/2015  Prepared by: Selinda Eon   Exercises  Seated Scapular Retraction  Standing Shoulder External Rotation with Resistance - 20 reps - 1 sets - 2 hold - 2x daily - 7x weekly  Doorway Pec Stretch at 90 Degrees Abduction - 3 reps - 1 sets - 30 hold - 2x daily - 7x weekly

## 2015-10-27 ENCOUNTER — Encounter (HOSPITAL_COMMUNITY): Payer: Self-pay | Admitting: Emergency Medicine

## 2015-10-27 ENCOUNTER — Inpatient Hospital Stay (HOSPITAL_COMMUNITY)
Admission: EM | Admit: 2015-10-27 | Discharge: 2015-11-09 | DRG: 326 | Disposition: A | Discharge status: Home Health | Payer: 59 | Attending: Family Medicine | Admitting: Family Medicine

## 2015-10-27 ENCOUNTER — Encounter (HOSPITAL_COMMUNITY)
Admission: EM | Disposition: A | Discharge status: Home Health | Payer: Self-pay | Source: Home / Self Care | Attending: Family Medicine

## 2015-10-27 DIAGNOSIS — N189 Chronic kidney disease, unspecified: Secondary | ICD-10-CM | POA: Diagnosis present

## 2015-10-27 DIAGNOSIS — I959 Hypotension, unspecified: Secondary | ICD-10-CM | POA: Diagnosis present

## 2015-10-27 DIAGNOSIS — R739 Hyperglycemia, unspecified: Secondary | ICD-10-CM | POA: Diagnosis present

## 2015-10-27 DIAGNOSIS — R5082 Postprocedural fever: Secondary | ICD-10-CM | POA: Diagnosis not present

## 2015-10-27 DIAGNOSIS — Z87891 Personal history of nicotine dependence: Secondary | ICD-10-CM

## 2015-10-27 DIAGNOSIS — R935 Abnormal findings on diagnostic imaging of other abdominal regions, including retroperitoneum: Secondary | ICD-10-CM | POA: Diagnosis not present

## 2015-10-27 DIAGNOSIS — K279 Peptic ulcer, site unspecified, unspecified as acute or chronic, without hemorrhage or perforation: Secondary | ICD-10-CM

## 2015-10-27 DIAGNOSIS — K5720 Diverticulitis of large intestine with perforation and abscess without bleeding: Secondary | ICD-10-CM | POA: Diagnosis not present

## 2015-10-27 DIAGNOSIS — M25512 Pain in left shoulder: Secondary | ICD-10-CM | POA: Diagnosis present

## 2015-10-27 DIAGNOSIS — Z79899 Other long term (current) drug therapy: Secondary | ICD-10-CM

## 2015-10-27 DIAGNOSIS — R000 Tachycardia, unspecified: Secondary | ICD-10-CM | POA: Diagnosis not present

## 2015-10-27 DIAGNOSIS — R509 Fever, unspecified: Secondary | ICD-10-CM | POA: Diagnosis not present

## 2015-10-27 DIAGNOSIS — D62 Acute posthemorrhagic anemia: Secondary | ICD-10-CM | POA: Diagnosis present

## 2015-10-27 DIAGNOSIS — K449 Diaphragmatic hernia without obstruction or gangrene: Secondary | ICD-10-CM | POA: Diagnosis present

## 2015-10-27 DIAGNOSIS — I129 Hypertensive chronic kidney disease with stage 1 through stage 4 chronic kidney disease, or unspecified chronic kidney disease: Secondary | ICD-10-CM | POA: Diagnosis present

## 2015-10-27 DIAGNOSIS — K5900 Constipation, unspecified: Secondary | ICD-10-CM | POA: Diagnosis present

## 2015-10-27 DIAGNOSIS — E876 Hypokalemia: Secondary | ICD-10-CM | POA: Diagnosis not present

## 2015-10-27 DIAGNOSIS — D72829 Elevated white blood cell count, unspecified: Secondary | ICD-10-CM

## 2015-10-27 DIAGNOSIS — K2950 Unspecified chronic gastritis without bleeding: Secondary | ICD-10-CM | POA: Diagnosis not present

## 2015-10-27 DIAGNOSIS — M62838 Other muscle spasm: Secondary | ICD-10-CM | POA: Diagnosis present

## 2015-10-27 DIAGNOSIS — K920 Hematemesis: Secondary | ICD-10-CM | POA: Diagnosis not present

## 2015-10-27 DIAGNOSIS — K254 Chronic or unspecified gastric ulcer with hemorrhage: Secondary | ICD-10-CM | POA: Diagnosis not present

## 2015-10-27 DIAGNOSIS — K922 Gastrointestinal hemorrhage, unspecified: Secondary | ICD-10-CM | POA: Diagnosis not present

## 2015-10-27 DIAGNOSIS — Z85820 Personal history of malignant melanoma of skin: Secondary | ICD-10-CM | POA: Diagnosis not present

## 2015-10-27 DIAGNOSIS — E1165 Type 2 diabetes mellitus with hyperglycemia: Secondary | ICD-10-CM | POA: Diagnosis not present

## 2015-10-27 DIAGNOSIS — J9811 Atelectasis: Secondary | ICD-10-CM | POA: Diagnosis not present

## 2015-10-27 DIAGNOSIS — M62830 Muscle spasm of back: Secondary | ICD-10-CM | POA: Diagnosis present

## 2015-10-27 DIAGNOSIS — M7989 Other specified soft tissue disorders: Secondary | ICD-10-CM

## 2015-10-27 DIAGNOSIS — R578 Other shock: Secondary | ICD-10-CM | POA: Diagnosis present

## 2015-10-27 DIAGNOSIS — D649 Anemia, unspecified: Secondary | ICD-10-CM | POA: Diagnosis not present

## 2015-10-27 DIAGNOSIS — E785 Hyperlipidemia, unspecified: Secondary | ICD-10-CM | POA: Diagnosis present

## 2015-10-27 DIAGNOSIS — Z933 Colostomy status: Secondary | ICD-10-CM | POA: Diagnosis not present

## 2015-10-27 DIAGNOSIS — M542 Cervicalgia: Secondary | ICD-10-CM | POA: Diagnosis present

## 2015-10-27 DIAGNOSIS — R918 Other nonspecific abnormal finding of lung field: Secondary | ICD-10-CM | POA: Diagnosis not present

## 2015-10-27 DIAGNOSIS — G894 Chronic pain syndrome: Secondary | ICD-10-CM | POA: Diagnosis present

## 2015-10-27 DIAGNOSIS — K219 Gastro-esophageal reflux disease without esophagitis: Secondary | ICD-10-CM | POA: Diagnosis not present

## 2015-10-27 DIAGNOSIS — K921 Melena: Secondary | ICD-10-CM | POA: Diagnosis present

## 2015-10-27 DIAGNOSIS — T39395A Adverse effect of other nonsteroidal anti-inflammatory drugs [NSAID], initial encounter: Secondary | ICD-10-CM | POA: Diagnosis present

## 2015-10-27 DIAGNOSIS — D500 Iron deficiency anemia secondary to blood loss (chronic): Secondary | ICD-10-CM | POA: Diagnosis not present

## 2015-10-27 DIAGNOSIS — R042 Hemoptysis: Secondary | ICD-10-CM | POA: Diagnosis not present

## 2015-10-27 DIAGNOSIS — M479 Spondylosis, unspecified: Secondary | ICD-10-CM | POA: Diagnosis present

## 2015-10-27 DIAGNOSIS — Z7982 Long term (current) use of aspirin: Secondary | ICD-10-CM | POA: Diagnosis not present

## 2015-10-27 DIAGNOSIS — R55 Syncope and collapse: Secondary | ICD-10-CM | POA: Diagnosis present

## 2015-10-27 DIAGNOSIS — K257 Chronic gastric ulcer without hemorrhage or perforation: Secondary | ICD-10-CM | POA: Diagnosis not present

## 2015-10-27 DIAGNOSIS — J90 Pleural effusion, not elsewhere classified: Secondary | ICD-10-CM | POA: Diagnosis not present

## 2015-10-27 HISTORY — DX: Diverticulitis of large intestine without perforation or abscess without bleeding: K57.32

## 2015-10-27 HISTORY — DX: Gastrointestinal hemorrhage, unspecified: K92.2

## 2015-10-27 HISTORY — DX: Gastritis, unspecified, without bleeding: K29.70

## 2015-10-27 HISTORY — DX: Malignant melanoma of other part of trunk: C43.59

## 2015-10-27 HISTORY — DX: Essential (primary) hypertension: I10

## 2015-10-27 HISTORY — DX: Adverse effect of other nonsteroidal anti-inflammatory drugs (NSAID), initial encounter: K92.2

## 2015-10-27 HISTORY — DX: Anxiety disorder, unspecified: F41.9

## 2015-10-27 HISTORY — DX: Adverse effect of other nonsteroidal anti-inflammatory drugs (NSAID), initial encounter: T39.395A

## 2015-10-27 HISTORY — PX: ESOPHAGOGASTRODUODENOSCOPY: SHX5428

## 2015-10-27 LAB — I-STAT CHEM 8, ED
BUN: 19 mg/dL (ref 6–20)
CALCIUM ION: 1.09 mmol/L — AB (ref 1.12–1.23)
CREATININE: 1.1 mg/dL (ref 0.61–1.24)
Chloride: 100 mmol/L — ABNORMAL LOW (ref 101–111)
GLUCOSE: 232 mg/dL — AB (ref 65–99)
HCT: 16 % — ABNORMAL LOW (ref 39.0–52.0)
HEMOGLOBIN: 5.4 g/dL — AB (ref 13.0–17.0)
Potassium: 4.3 mmol/L (ref 3.5–5.1)
SODIUM: 135 mmol/L (ref 135–145)
TCO2: 23 mmol/L (ref 0–100)

## 2015-10-27 LAB — PREPARE RBC (CROSSMATCH)

## 2015-10-27 LAB — CBC WITH DIFFERENTIAL/PLATELET
BASOS ABS: 0 10*3/uL (ref 0.0–0.1)
BASOS PCT: 0 %
EOS ABS: 0.6 10*3/uL (ref 0.0–0.7)
EOS PCT: 5 %
HEMATOCRIT: 16.5 % — AB (ref 39.0–52.0)
Hemoglobin: 5.5 g/dL — CL (ref 13.0–17.0)
Lymphocytes Relative: 10 %
Lymphs Abs: 1.1 10*3/uL (ref 0.7–4.0)
MCH: 30.6 pg (ref 26.0–34.0)
MCHC: 33.3 g/dL (ref 30.0–36.0)
MCV: 91.7 fL (ref 78.0–100.0)
MONO ABS: 0.7 10*3/uL (ref 0.1–1.0)
Monocytes Relative: 6 %
NEUTROS ABS: 8.8 10*3/uL — AB (ref 1.7–7.7)
Neutrophils Relative %: 79 %
PLATELETS: 320 10*3/uL (ref 150–400)
RBC: 1.8 MIL/uL — ABNORMAL LOW (ref 4.22–5.81)
RDW: 13.4 % (ref 11.5–15.5)
WBC: 11.2 10*3/uL — ABNORMAL HIGH (ref 4.0–10.5)

## 2015-10-27 LAB — CBC
HEMATOCRIT: 20.5 % — AB (ref 39.0–52.0)
HEMOGLOBIN: 7 g/dL — AB (ref 13.0–17.0)
MCH: 30.3 pg (ref 26.0–34.0)
MCHC: 34.1 g/dL (ref 30.0–36.0)
MCV: 88.7 fL (ref 78.0–100.0)
Platelets: 262 10*3/uL (ref 150–400)
RBC: 2.31 MIL/uL — ABNORMAL LOW (ref 4.22–5.81)
RDW: 13.7 % (ref 11.5–15.5)
WBC: 11.2 10*3/uL — ABNORMAL HIGH (ref 4.0–10.5)

## 2015-10-27 LAB — GLUCOSE, CAPILLARY
Glucose-Capillary: 89 mg/dL (ref 65–99)
Glucose-Capillary: 145 mg/dL — ABNORMAL HIGH (ref 65–99)

## 2015-10-27 LAB — MRSA PCR SCREENING: MRSA by PCR: NEGATIVE

## 2015-10-27 LAB — I-STAT TROPONIN, ED: TROPONIN I, POC: 0 ng/mL (ref 0.00–0.08)

## 2015-10-27 SURGERY — EGD (ESOPHAGOGASTRODUODENOSCOPY)
Anesthesia: Moderate Sedation

## 2015-10-27 MED ORDER — SODIUM CHLORIDE 0.9 % IV SOLN: Freq: Once | INTRAVENOUS | Status: DC

## 2015-10-27 MED ORDER — FENTANYL CITRATE (PF) 100 MCG/2ML IJ SOLN
INTRAMUSCULAR | Status: AC
  Filled 2015-10-27: qty 2

## 2015-10-27 MED ORDER — SODIUM CHLORIDE 0.9 % IV SOLN
INTRAVENOUS | Status: AC
  Administered 2015-10-27: 15:00:00 via INTRAVENOUS

## 2015-10-27 MED ORDER — INSULIN ASPART 100 UNIT/ML ~~LOC~~ SOLN: 0.0000 [IU] | Freq: Every day | SUBCUTANEOUS | Status: DC

## 2015-10-27 MED ORDER — FAMOTIDINE IN NACL 20-0.9 MG/50ML-% IV SOLN
20.0000 mg | Freq: Two times a day (BID) | INTRAVENOUS | Status: DC
  Administered 2015-10-27 – 2015-10-29 (×5): 20 mg via INTRAVENOUS
  Filled 2015-10-27 (×5): qty 50

## 2015-10-27 MED ORDER — BUTAMBEN-TETRACAINE-BENZOCAINE 2-2-14 % EX AERO
INHALATION_SPRAY | CUTANEOUS | Status: DC | PRN
  Administered 2015-10-27: 2 via TOPICAL

## 2015-10-27 MED ORDER — PANTOPRAZOLE SODIUM 40 MG PO TBEC
40.0000 mg | DELAYED_RELEASE_TABLET | Freq: Two times a day (BID) | ORAL | Status: DC
  Administered 2015-10-27 – 2015-10-29 (×4): 40 mg via ORAL
  Filled 2015-10-27 (×4): qty 1

## 2015-10-27 MED ORDER — DIPHENHYDRAMINE HCL 50 MG/ML IJ SOLN
INTRAMUSCULAR | Status: AC
  Filled 2015-10-27: qty 1

## 2015-10-27 MED ORDER — SODIUM CHLORIDE 0.9 % IV SOLN
INTRAVENOUS | Status: DC
  Administered 2015-10-29: 08:00:00 via INTRAVENOUS

## 2015-10-27 MED ORDER — MIDAZOLAM HCL 10 MG/2ML IJ SOLN
INTRAMUSCULAR | Status: DC | PRN
  Administered 2015-10-27 (×2): 2 mg via INTRAVENOUS
  Administered 2015-10-27: 1 mg via INTRAVENOUS

## 2015-10-27 MED ORDER — DIPHENHYDRAMINE HCL 50 MG/ML IJ SOLN
INTRAMUSCULAR | Status: DC | PRN
  Administered 2015-10-27: 12.5 mg via INTRAVENOUS

## 2015-10-27 MED ORDER — SODIUM CHLORIDE 0.9 % IV SOLN
10.0000 mL/h | Freq: Once | INTRAVENOUS | Status: AC
  Administered 2015-10-27: 10 mL/h via INTRAVENOUS

## 2015-10-27 MED ORDER — SODIUM CHLORIDE 0.9% FLUSH
3.0000 mL | Freq: Two times a day (BID) | INTRAVENOUS | Status: DC
Start: 2015-10-27 — End: 2015-11-09
  Administered 2015-10-27 – 2015-11-03 (×8): 3 mL via INTRAVENOUS

## 2015-10-27 MED ORDER — ACETAMINOPHEN 325 MG PO TABS
650.0000 mg | ORAL_TABLET | Freq: Four times a day (QID) | ORAL | Status: DC | PRN
  Administered 2015-10-30: 650 mg via ORAL
  Filled 2015-10-27: qty 2

## 2015-10-27 MED ORDER — ONDANSETRON HCL 4 MG/2ML IJ SOLN
4.0000 mg | Freq: Four times a day (QID) | INTRAMUSCULAR | Status: DC | PRN
  Administered 2015-10-28 – 2015-11-03 (×7): 4 mg via INTRAVENOUS
  Filled 2015-10-27 (×8): qty 2

## 2015-10-27 MED ORDER — ONDANSETRON HCL 4 MG PO TABS: 4.0000 mg | ORAL_TABLET | Freq: Four times a day (QID) | ORAL | Status: DC | PRN

## 2015-10-27 MED ORDER — MIDAZOLAM HCL 5 MG/ML IJ SOLN
INTRAMUSCULAR | Status: AC
  Filled 2015-10-27: qty 2

## 2015-10-27 MED ORDER — BOOST / RESOURCE BREEZE PO LIQD
1.0000 | Freq: Three times a day (TID) | ORAL | Status: DC
  Administered 2015-10-28: 1 via ORAL

## 2015-10-27 MED ORDER — INSULIN ASPART 100 UNIT/ML ~~LOC~~ SOLN
0.0000 [IU] | Freq: Three times a day (TID) | SUBCUTANEOUS | Status: DC
  Administered 2015-10-29: 1 [IU] via SUBCUTANEOUS
  Administered 2015-10-29: 2 [IU] via SUBCUTANEOUS
  Administered 2015-10-29: 1 [IU] via SUBCUTANEOUS
  Administered 2015-10-30: 2 [IU] via SUBCUTANEOUS
  Administered 2015-11-02: 1 [IU] via SUBCUTANEOUS

## 2015-10-27 MED ORDER — SODIUM CHLORIDE 0.9 % IV BOLUS (SEPSIS)
1000.0000 mL | Freq: Once | INTRAVENOUS | Status: AC
Start: 2015-10-27 — End: 2015-10-27
  Administered 2015-10-27: 1000 mL via INTRAVENOUS

## 2015-10-27 MED ORDER — FENTANYL CITRATE (PF) 100 MCG/2ML IJ SOLN
INTRAMUSCULAR | Status: DC | PRN
  Administered 2015-10-27: 25 ug via INTRAVENOUS

## 2015-10-27 MED ORDER — ACETAMINOPHEN 650 MG RE SUPP: 650.0000 mg | Freq: Four times a day (QID) | RECTAL | Status: DC | PRN

## 2015-10-27 NOTE — H&P (Signed)
History and Physical    Miguel [name] [id number] DOB: [birth year] DOA: 10/27/2015  PCP: Donnie Coffin, MD Patient coming from: home  Chief Complaint: hematemesis/syncope  HPI: Miguel [name] is a 65 y.o. male with medical history significant for gi bleed related to nsaid use, gastritis, recent sigmoid diverticulitis with perforation s/p Hartmann's procedure presents to ED hematemesis and syncope. Initial evaluation reveals acute blood loss anemia likely related to GI bleed in setting of NSAID use.  Information is obtained from the chart and the patient and the wife who is at the bedside. Wife reports patient with 2 episodes of vomiting large amounts bright red blood. EMS was called and reportedly patient was pale diaphoretic but alert. He then had an episode of emesis bright red blood of 250cc. his blood pressure 60 palp with a heart rate of 1:30 he was immediately given 500 mL of normal saline. Patient reports several weeks he started feeling "not too good". He complains of intermittent nausea without emesis some shortness of breath with exertion and worsening chronic left shoulder pain. In addition he was having a feeling of constipation for which he took miralax. He denies abdominal pain but does endorse continuous NSAID use over the years. Of note in April of this he had perforated sigmoid diverticulitis with surgical repair hartmann procedure and colostomy. He is scheduled to have colostomy reversed August 10. He denies any fever chills cough abdominal pain dysuria hematuria frequency or urgency.    ED Course: In the emergency department he receives 1 L of normal saline Pepcid IV and transfusion of 2 units packed red blood cells initiated.  Review of Systems: As per HPI otherwise 10 point review of systems negative.   Ambulatory Status: Ambulates independently with steady gait  Past Medical History:  Diagnosis Date  . Anemia   . Cancer ([place])   . Chronic kidney disease    KIDNEY  STONE  . Gastritis   . Hyperlipidemia   . Sigmoid diverticulitis    with perforation    Past Surgical History:  Procedure Laterality Date  . KNEE SURGERY  1971  . LAPAROTOMY N/A 07/05/2015   Procedure: PARTIAL SIGMOID COLECTOMY AND COLOSTOMY;  Surgeon: [name], MD;  Location: [facility];  Service: General;  Laterality: N/A;  . SKIN CANCER REMOVED  2001/2009    Social History   Social History  . Marital status: Married    Spouse name: N/A  . Number of children: N/A  . Years of education: N/A   Occupational History  . Not on file.   Social History Main Topics  . Smoking status: Never Smoker  . Smokeless tobacco: Never Used  . Alcohol use No  . Drug use: No  . Sexual activity: Not on file   Other Topics Concern  . Not on file   Social History Narrative  . No narrative on file  Lives at home with his wife he is self-employed he has a moving company  No Known Allergies  Family History  Problem Relation Age of Onset  . Stroke Mother   . Stroke Brother   . Heart disease Brother     Prior to Admission medications   Medication Sig Start Date End Date Taking? Authorizing Provider  acetaminophen (TYLENOL) 500 MG tablet Take 1,000 mg by mouth 3 (three) times daily.    Yes Historical Provider, MD  aspirin EC 81 MG tablet Take 81 mg by mouth daily after supper.    Yes Historical Provider, MD  Calcium  Polycarbophil (FIBER-LAX PO) Take 2 capsules by mouth 2 (two) times daily as needed (constipation).   Yes Historical Provider, MD  diazepam (VALIUM) 2 MG tablet Take 1 tablet (2 mg total) by mouth every 6 (six) hours as needed for muscle spasms. Patient taking differently: Take 1 mg by mouth every 6 (six) hours as needed for muscle spasms.  10/11/15  Yes Lacretia Leigh, MD  ferrous sulfate 325 (65 FE) MG tablet Take 325 mg by mouth daily.   Yes Historical Provider, MD  ibuprofen (ADVIL,MOTRIN) 200 MG tablet Take 200 mg by mouth every 6 (six) hours as needed (pain).   Yes  Historical Provider, MD  lisinopril (PRINIVIL,ZESTRIL) 10 MG tablet Take 5 mg by mouth daily.    Yes Historical Provider, MD  LORazepam (ATIVAN) 1 MG tablet Take 0.5 mg by mouth daily as needed for anxiety.   Yes Historical Provider, MD  meloxicam (MOBIC) 15 MG tablet Take 15 mg by mouth daily.   Yes Historical Provider, MD  Multiple Vitamin (MULTIVITAMIN WITH MINERALS) TABS tablet Take 1 tablet by mouth daily after supper.    Yes Historical Provider, MD  Omega-3 Fatty Acids (FISH OIL) 1200 MG CAPS Take 2,400 mg by mouth daily after supper.    Yes Historical Provider, MD  ondansetron (ZOFRAN) 4 MG tablet Take 4 mg by mouth every 8 (eight) hours as needed for nausea or vomiting.   Yes Historical Provider, MD  oxyCODONE-acetaminophen (PERCOCET/ROXICET) 5-325 MG tablet Take 1-2 tablets by mouth every 4 (four) hours as needed for severe pain. Patient taking differently: Take 0.25-1 tablets by mouth every 4 (four) hours as needed for severe pain.  10/11/15  Yes Lacretia Leigh, MD  Polyethyl Glycol-Propyl Glycol (SYSTANE OP) Place 1 drop into both eyes 3 (three) times daily as needed (dry eyes).   Yes Historical Provider, MD  polyethylene glycol (MIRALAX / GLYCOLAX) packet Take 8.5-17 g by mouth 2 (two) times daily as needed (constipation). Mix in 8 oz liquid and drink   Yes Historical Provider, MD  Red Yeast Rice Extract (RED YEAST RICE PO) Take 2 tablets by mouth daily after supper.    Yes Historical Provider, MD  zolpidem (AMBIEN) 10 MG tablet Take 5 mg by mouth See admin instructions. Take 1/2 tablet (5 mg) by mouth daily at bedtime, may also take another 1/2 tablet if needed during the night   Yes Historical Provider, MD    Physical Exam: Vitals:   10/27/15 1500 10/27/15 1515 10/27/15 1530 10/27/15 1612  BP: 117/68 109/73 106/79 108/59  Pulse: 101 100 99 96  Resp: 23 18 16 14   Temp:    99.3 F (37.4 C)  TempSrc:    Oral  SpO2: 100% 100% 100% 99%     General:  Appears calm and comfortable,  somewhat pale Eyes:  PERRL, EOMI, normal lids, iris ENT:  grossly normal hearing, lips & tongue, dismembered of his mouth slightly pale somewhat dry Neck:  no LAD, masses or thyromegaly Cardiovascular:  Tachycardic but regular, no m/r/g. No LE edema.  Respiratory:  CTA bilaterally, no w/r/r. Normal respiratory effort. Abdomen:  soft, ntnd, positive bowel sounds no guarding or rebounding. Colostomy left draining thick liquid maroon stool moderate amount Skin:  no rash or induration seen on limited exam Musculoskeletal:  grossly normal tone BUE/BLE, good ROM, no bony abnormality Psychiatric:  grossly normal mood and affect, speech fluent and appropriate, AOx3 Neurologic:  CN 2-12 grossly intact, moves all extremities in coordinated fashion, sensation intact  Labs  on Admission: I have personally reviewed following labs and imaging studies  CBC:  Recent Labs Lab 10/27/15 1350 10/27/15 1401  WBC 11.2*  --   NEUTROABS 8.8*  --   HGB 5.5* 5.4*  HCT 16.5* 16.0*  MCV 91.7  --   PLT 320  --    Basic Metabolic Panel:  Recent Labs Lab 10/27/15 1401  NA 135  K 4.3  CL 100*  GLUCOSE 232*  BUN 19  CREATININE 1.10   GFR: CrCl cannot be calculated (Unknown ideal weight.). Liver Function Tests: No results for input(s): AST, ALT, ALKPHOS, BILITOT, PROT, ALBUMIN in the last 168 hours. No results for input(s): LIPASE, AMYLASE in the last 168 hours. No results for input(s): AMMONIA in the last 168 hours. Coagulation Profile: No results for input(s): INR, PROTIME in the last 168 hours. Cardiac Enzymes: No results for input(s): CKTOTAL, CKMB, CKMBINDEX, TROPONINI in the last 168 hours. BNP (last 3 results) No results for input(s): PROBNP in the last 8760 hours. HbA1C: No results for input(s): HGBA1C in the last 72 hours. CBG: No results for input(s): GLUCAP in the last 168 hours. Lipid Profile: No results for input(s): CHOL, HDL, LDLCALC, TRIG, CHOLHDL, LDLDIRECT in the last 72  hours. Thyroid Function Tests: No results for input(s): TSH, T4TOTAL, FREET4, T3FREE, THYROIDAB in the last 72 hours. Anemia Panel: No results for input(s): VITAMINB12, FOLATE, FERRITIN, TIBC, IRON, RETICCTPCT in the last 72 hours. Urine analysis:    Component Value Date/Time   COLORURINE YELLOW 10/11/2015 1459   APPEARANCEUR CLEAR 10/11/2015 1459   LABSPEC 1.011 10/11/2015 1459   PHURINE 6.0 10/11/2015 1459   GLUCOSEU NEGATIVE 10/11/2015 1459   HGBUR NEGATIVE 10/11/2015 1459   BILIRUBINUR NEGATIVE 10/11/2015 1459   KETONESUR NEGATIVE 10/11/2015 1459   PROTEINUR NEGATIVE 10/11/2015 1459   UROBILINOGEN 0.2 11/03/2013 0629   NITRITE NEGATIVE 10/11/2015 1459   LEUKOCYTESUR NEGATIVE 10/11/2015 1459    Creatinine Clearance: CrCl cannot be calculated (Unknown ideal weight.).  Sepsis Labs: @LABRCNTIP (procalcitonin:4,lacticidven:4) )No results found for this or any previous visit (from the past 240 hour(s)).   Radiological Exams on Admission: No results found.  EKG: Independently reviewed. Sinus tachycardia  Assessment/Plan Principal Problem:   Acute blood loss anemia Active Problems:   Diverticulitis of colon with perforation   GI bleed   Syncope   Hyperglycemia   Hypotension   Neck pain   Hematemesis   Hematochezia   1. Blood loss anemia. Secondary to hematemesis related to GI bleed in the setting of NSAID and aspirin use. 11 5.4 on admission. Last documented hemoglobin 14. He received 1556ml of saline and transfusion of 2 units packed red blood cells initiated in the emergency department. Recent Hartmann procedure secondary to colonic diverticulitis perforation. Colostomy draining maroon stool -Admit to step down -Transfuse 1 additional unit packed red blood cells for a total of 3 units -Serial CBCs -EGD this afternoon -Pepcid twice a day -GI consult -Surgery consult  #2. GI bleed/hematemesis related to NSAID use and aspirin use. History of same. Chart review  indicates last endoscopy 2011 revealing gastritis. Indicates patient counseled to stop NSAID use at that time. Patient's chart evaluated by GI who is note indicates EGD for this afternoon. -Hold NSAIDs -Nothing by mouth -Pepcid twice a day -EGD this afternoon per GI  #3. Hematochezia. She reports drainage from colostomy has been "almost [place]" since his surgery with the exception of one episode this morning where stool was brown. After that colostomy draining moderate amounts of maroon  stool. Of note patient underwent Hartmann procedure in April of this year scheduled for colostomy reversal August 10 of this year. -GI consult as noted above -General surgery consult requested  #4. Hypotension. Likely related to volume loss secondary to #2. History of hypertension. Home medications include lisinopril. Blood pressure 133/74 on admission. -Hold lisinopril for now -Monitor -Resume antihypertensive meds as indicated  #5. Hyperglycemia. Glucose 232 on admission. No documented history diabetes. -Obtain a hemoglobin A1c -Monitor CBGs -Use sliding scale for now  #6. Syncope. Likely related to #1 and #2 and #4. -Check orthostatics once stabilized -Obtain a TSH    DVT prophylaxis: scd  Code Status: full  Family Communication: wife at bedside  Disposition Plan: home  Consults called: gi and general surgery  Admission status: inpatient    Radene Gunning MD [facility]  If 7PM-7AM, please contact night-coverage [email or web address] Password [email or web address]  10/27/2015, 4:20 PM

## 2015-10-27 NOTE — ED Triage Notes (Signed)
Pt to ER BIB [facility] from home where wife called out due to patient having witnessed syncopal episode and vomiting copious amounts of bright red blood. Per pt the vomiting started this morning. On EMS arrival patient was pale, diaphoretic but alert and oriented, as EMS moved patient to truck he became altered and had a syncopal episode. On arrival to ER, pt remains pale, diaphoretic, and cool to touch but is alert and oriented. EMS reports pt to have vomited approximately 250 cc bright red blood in their presence. Received 500 cc NS in route, initial BP 60 palpated with HR 130. At present 91/54, HR 113, 100% RA, RR 18.

## 2015-10-27 NOTE — Consult Note (Signed)
Reason for Consult: GI Bleed  Referring Physician: Dr. Renato Shin   Miguel [name] is an 65 y.o. male with a history of GI bleeds, chronic NSAID use, gastritis, sigmoid diverticulitis with hartmann and colostomy and chronic anemia. Patient has experienced black stools for the past three weeks. Patient was anticipating a reversal to be conducted by Dr. Nedra Hai on August 10,2017.  Patient presented to the ED at [facility] after two episodes of hematemesis, SOB, syncope and left shoulder pain. Patient underwent upper GI endoscopy on 10/27/15, which revealed a non-bleeding cratered ulcer. GI bleed suspected to be caused from recent NSAID use.   Past Medical History:  Diagnosis Date  . Anemia   . Cancer ([place])   . Chronic kidney disease    KIDNEY STONE  . Gastritis   . Hyperlipidemia   . Sigmoid diverticulitis    with perforation    Past Surgical History:  Procedure Laterality Date  . KNEE SURGERY  1971  . LAPAROTOMY N/A 07/05/2015   Procedure: PARTIAL SIGMOID COLECTOMY AND COLOSTOMY;  Surgeon: [name], MD;  Location: [facility];  Service: General;  Laterality: N/A;  . SKIN CANCER REMOVED  2001/2009    Family History  Problem Relation Age of Onset  . Stroke Mother   . Stroke Brother   . Heart disease Brother     Social History:  reports that he has never smoked. He has never used smokeless tobacco. He reports that he does not drink alcohol or use drugs.  Allergies: No Known Allergies  Prior to Admission medications   Medication Sig Start Date End Date Taking? Authorizing Provider  acetaminophen (TYLENOL) 500 MG tablet Take 1,000 mg by mouth 3 (three) times daily.    Yes Historical Provider, MD  aspirin EC 81 MG tablet Take 81 mg by mouth daily after supper.    Yes Historical Provider, MD  Calcium Polycarbophil (FIBER-LAX PO) Take 2 capsules by mouth 2 (two) times daily as needed (constipation).   Yes Historical Provider, MD  diazepam (VALIUM) 2 MG tablet Take 1 tablet (2 mg total)  by mouth every 6 (six) hours as needed for muscle spasms. Patient taking differently: Take 1 mg by mouth every 6 (six) hours as needed for muscle spasms.  10/11/15  Yes Lacretia Leigh, MD  ferrous sulfate 325 (65 FE) MG tablet Take 325 mg by mouth daily.   Yes Historical Provider, MD  ibuprofen (ADVIL,MOTRIN) 200 MG tablet Take 200 mg by mouth every 6 (six) hours as needed (pain).   Yes Historical Provider, MD  lisinopril (PRINIVIL,ZESTRIL) 10 MG tablet Take 5 mg by mouth daily.    Yes Historical Provider, MD  LORazepam (ATIVAN) 1 MG tablet Take 0.5 mg by mouth daily as needed for anxiety.   Yes Historical Provider, MD  meloxicam (MOBIC) 15 MG tablet Take 15 mg by mouth daily.   Yes Historical Provider, MD  Multiple Vitamin (MULTIVITAMIN WITH MINERALS) TABS tablet Take 1 tablet by mouth daily after supper.    Yes Historical Provider, MD  Omega-3 Fatty Acids (FISH OIL) 1200 MG CAPS Take 2,400 mg by mouth daily after supper.    Yes Historical Provider, MD  ondansetron (ZOFRAN) 4 MG tablet Take 4 mg by mouth every 8 (eight) hours as needed for nausea or vomiting.   Yes Historical Provider, MD  oxyCODONE-acetaminophen (PERCOCET/ROXICET) 5-325 MG tablet Take 1-2 tablets by mouth every 4 (four) hours as needed for severe pain. Patient taking differently: Take 0.25-1 tablets by mouth every 4 (  four) hours as needed for severe pain.  10/11/15  Yes Lacretia Leigh, MD  Polyethyl Glycol-Propyl Glycol (SYSTANE OP) Place 1 drop into both eyes 3 (three) times daily as needed (dry eyes).   Yes Historical Provider, MD  polyethylene glycol (MIRALAX / GLYCOLAX) packet Take 8.5-17 g by mouth 2 (two) times daily as needed (constipation). Mix in 8 oz liquid and drink   Yes Historical Provider, MD  Red Yeast Rice Extract (RED YEAST RICE PO) Take 2 tablets by mouth daily after supper.    Yes Historical Provider, MD  zolpidem (AMBIEN) 10 MG tablet Take 5 mg by mouth See admin instructions. Take 1/2 tablet (5 mg) by mouth daily at  bedtime, may also take another 1/2 tablet if needed during the night   Yes Historical Provider, MD     Results for orders placed or performed during the hospital encounter of 10/27/15 (from the past 48 hour(s))  Prepare RBC     Status: None   Collection Time: 10/27/15  1:50 PM  Result Value Ref Range   Order Confirmation ORDER PROCESSED BY BLOOD BANK   CBC with Differential/Platelet     Status: Abnormal   Collection Time: 10/27/15  1:50 PM  Result Value Ref Range   WBC 11.2 (H) 4.0 - 10.5 K/uL   RBC 1.80 (L) 4.22 - 5.81 MIL/uL   Hemoglobin 5.5 (LL) 13.0 - 17.0 g/dL    Comment: REPEATED TO VERIFY CRITICAL RESULT CALLED TO, READ BACK BY AND VERIFIED WITH: HAYDEN MORRISON,RN AT [id number] 10/27/15 BY ZBEECH.    HCT 16.5 (L) 39.0 - 52.0 %   MCV 91.7 78.0 - 100.0 fL   MCH 30.6 26.0 - 34.0 pg   MCHC 33.3 30.0 - 36.0 g/dL   RDW 13.4 11.5 - 15.5 %   Platelets 320 150 - 400 K/uL   Neutrophils Relative % 79 %   Neutro Abs 8.8 (H) 1.7 - 7.7 K/uL   Lymphocytes Relative 10 %   Lymphs Abs 1.1 0.7 - 4.0 K/uL   Monocytes Relative 6 %   Monocytes Absolute 0.7 0.1 - 1.0 K/uL   Eosinophils Relative 5 %   Eosinophils Absolute 0.6 0.0 - 0.7 K/uL   Basophils Relative 0 %   Basophils Absolute 0.0 0.0 - 0.1 K/uL  Type and screen     Status: None (Preliminary result)   Collection Time: 10/27/15  1:50 PM  Result Value Ref Range   ABO/RH(D) A POS    Antibody Screen NEG    Sample Expiration 10/30/2015    Unit Number [id number]    Blood Component Type RED CELLS,LR    Unit division 00    Status of Unit ISSUED    Transfusion Status OK TO TRANSFUSE JACUB[facility]Z    Crossmatch Result COMPATIBLE    Unit Number [id number]    Blood Component Type RED CELLS,LR    Unit division 00    Status of Unit ISSUED    Transfusion Status OK TO TRANSFUSE JAC[facility]Z    Crossmatch Result COMPATIBLE    Unit Number [id number]    Blood Component Type RBC LR PHER2    Unit division 00    Status of Unit ALLOCATED     Transfusion Status OK TO TRANSFUSE    Crossmatch Result Compatible   I-stat troponin, ED     Status: None   Collection Time: 10/27/15  1:59 PM  Result Value Ref Range   Troponin i, poc 0.00 0.00 - 0.08 ng/mL   Comment 3  Comment: Due to the release kinetics of cTnI, a negative result within the first hours of the onset of symptoms does not rule out myocardial infarction with certainty. If myocardial infarction is still suspected, repeat the test at appropriate intervals.   I-stat chem 8, ed     Status: Abnormal   Collection Time: 10/27/15  2:01 PM  Result Value Ref Range   Sodium 135 135 - 145 mmol/L   Potassium 4.3 3.5 - 5.1 mmol/L   Chloride 100 (L) 101 - 111 mmol/L   BUN 19 6 - 20 mg/dL   Creatinine, Ser 1.10 0.61 - 1.24 mg/dL   Glucose, Bld 232 (H) 65 - 99 mg/dL   Calcium, Ion 1.09 (L) 1.12 - 1.23 mmol/L   TCO2 23 0 - 100 mmol/L   Hemoglobin 5.4 (LL) 13.0 - 17.0 g/dL   HCT 16.0 (L) 39.0 - 52.0 %   Comment NOTIFIED PHYSICIAN   Prepare RBC     Status: None   Collection Time: 10/27/15  3:51 PM  Result Value Ref Range   Order Confirmation ORDER PROCESSED BY BLOOD BANK     No results found.  Review of Systems  Constitutional: Negative for chills, fever and weight loss.  Eyes: Negative for blurred vision.  Respiratory: Positive for hemoptysis.   Cardiovascular: Negative for chest pain and orthopnea.  Gastrointestinal: Positive for blood in stool, nausea and vomiting.  Genitourinary: Negative for dysuria and urgency.  Musculoskeletal: Positive for joint pain and myalgias.  Neurological: Negative for headaches.   Blood pressure 108/59, pulse 96, temperature 99.3 F (37.4 C), temperature source Oral, resp. rate 14, SpO2 99 %. Physical Exam  Constitutional: He is oriented to person, place, and time. He appears well-developed and well-nourished.  HENT:  Head: Normocephalic and atraumatic.  Left Ear: External ear normal.  Eyes: Conjunctivae and EOM are normal.  Pupils are equal, round, and reactive to light.  Neck: Normal range of motion.  Cardiovascular: Normal rate, regular rhythm, normal heart sounds and intact distal pulses.   Respiratory: Effort normal. No respiratory distress. He has no wheezes. He has no rales.  GI: Soft. He exhibits no distension and no mass. There is no tenderness. There is no rebound and no guarding.  Neurological: He is alert and oriented to person, place, and time.  Skin: Skin is warm and dry.  Psychiatric: He has a normal mood and affect. His behavior is normal. Judgment and thought content normal.    Assessment/Plan: Gastrointestinal Bleed Secondary to NSAIDs Following primarily due to anticipated colostomy reversal. Will update Dr.Blackman's team, GI advises postponing colostomy reversal until stable. Signing off from here. Appreciate the consult.  VTE: SCDs FEN: Clears    Lannie Fields PASII  10/27/2015, 4:15 PM

## 2015-10-27 NOTE — Consult Note (Signed)
Reason for Consult: Upper GI bleeding Referring Physician: ER physician  Miguel [name] is an 65 y.o. male.  HPI: Patient seen and examined and his hospital computer chart and our office computer chart was reviewed and he has had multiple workups for chronic anemia and his previous endoscopies and colonoscopies were reviewed and he recently had a perforated diverticuli requiring surgery and is about to have a reversal however he's been on an aspirin a day as well as multiple arthritis pills and has had black stools for some time and his hemoglobin dropped from 14-5 and he threw up lots of blood today and he has not been on any stomach pills and does not have any other GI complaints  Past Medical History:  Diagnosis Date  . Anemia   . Cancer ([place])   . Chronic kidney disease    KIDNEY STONE  . Gastritis   . Hyperlipidemia   . Sigmoid diverticulitis    with perforation    Past Surgical History:  Procedure Laterality Date  . KNEE SURGERY  1971  . LAPAROTOMY N/A 07/05/2015   Procedure: PARTIAL SIGMOID COLECTOMY AND COLOSTOMY;  Surgeon: [name], MD;  Location: [facility];  Service: General;  Laterality: N/A;  . SKIN CANCER REMOVED  2001/2009    Family History  Problem Relation Age of Onset  . Stroke Mother   . Stroke Brother   . Heart disease Brother     Social History:  reports that he has never smoked. He has never used smokeless tobacco. He reports that he does not drink alcohol or use drugs.  Allergies: No Known Allergies  Medications: I have reviewed the patient's current medications.  Results for orders placed or performed during the hospital encounter of 10/27/15 (from the past 48 hour(s))  Prepare RBC     Status: None   Collection Time: 10/27/15  1:50 PM  Result Value Ref Range   Order Confirmation ORDER PROCESSED BY BLOOD BANK   CBC with Differential/Platelet     Status: Abnormal   Collection Time: 10/27/15  1:50 PM  Result Value Ref Range   WBC 11.2 (H) 4.0 - 10.5  K/uL   RBC 1.80 (L) 4.22 - 5.81 MIL/uL   Hemoglobin 5.5 (LL) 13.0 - 17.0 g/dL    Comment: REPEATED TO VERIFY CRITICAL RESULT CALLED TO, READ BACK BY AND VERIFIED WITH: HAYDEN MORRISON,RN AT 1443 10/27/15 BY ZBEECH.    HCT 16.5 (L) 39.0 - 52.0 %   MCV 91.7 78.0 - 100.0 fL   MCH 30.6 26.0 - 34.0 pg   MCHC 33.3 30.0 - 36.0 g/dL   RDW 13.4 11.5 - 15.5 %   Platelets 320 150 - 400 K/uL   Neutrophils Relative % 79 %   Neutro Abs 8.8 (H) 1.7 - 7.7 K/uL   Lymphocytes Relative 10 %   Lymphs Abs 1.1 0.7 - 4.0 K/uL   Monocytes Relative 6 %   Monocytes Absolute 0.7 0.1 - 1.0 K/uL   Eosinophils Relative 5 %   Eosinophils Absolute 0.6 0.0 - 0.7 K/uL   Basophils Relative 0 %   Basophils Absolute 0.0 0.0 - 0.1 K/uL  Type and screen     Status: None (Preliminary result)   Collection Time: 10/27/15  1:50 PM  Result Value Ref Range   ABO/RH(D) A POS    Antibody Screen NEG    Sample Expiration 10/30/2015    Unit Number [id number]    Blood Component Type RED CELLS,LR    Unit division  00    Status of Unit ISSUED    Transfusion Status OK TO TRANSFUSE [facility]Z    Crossmatch Result COMPATIBLE    Unit Number [id number]    Blood Component Type RED CELLS,LR    Unit division 00    Status of Unit ISSUED    Transfusion Status OK TO TRANSFUSE [facility]    Crossmatch Result COMPATIBLE    Unit Number [id number]    Blood Component Type RBC LR PHER2    Unit division 00    Status of Unit ALLOCATED    Transfusion Status OK TO TRANSFUSE    Crossmatch Result Compatible   I-stat troponin, ED     Status: None   Collection Time: 10/27/15  1:59 PM  Result Value Ref Range   Troponin i, poc 0.00 0.00 - 0.08 ng/mL   Comment 3            Comment: Due to the release kinetics of cTnI, a negative result within the first hours of the onset of symptoms does not rule out myocardial infarction with certainty. If myocardial infarction is still suspected, repeat the test at appropriate intervals.    I-stat chem 8, ed     Status: Abnormal   Collection Time: 10/27/15  2:01 PM  Result Value Ref Range   Sodium 135 135 - 145 mmol/L   Potassium 4.3 3.5 - 5.1 mmol/L   Chloride 100 (L) 101 - 111 mmol/L   BUN 19 6 - 20 mg/dL   Creatinine, Ser 1.10 0.61 - 1.24 mg/dL   Glucose, Bld 232 (H) 65 - 99 mg/dL   Calcium, Ion 1.09 (L) 1.12 - 1.23 mmol/L   TCO2 23 0 - 100 mmol/L   Hemoglobin 5.4 (LL) 13.0 - 17.0 g/dL   HCT 16.0 (L) 39.0 - 52.0 %   Comment NOTIFIED PHYSICIAN   Prepare RBC     Status: None   Collection Time: 10/27/15  3:51 PM  Result Value Ref Range   Order Confirmation ORDER PROCESSED BY BLOOD BANK     No results found.  ROS multiple arthritis complaints which we discussed with he and his wife Blood pressure 106/79, pulse 99, temperature 98.9 F (37.2 C), temperature source Oral, resp. rate 16, SpO2 100 %. Physical Exam vital signs stable afebrile no acute distress lungs are clear heart regular rate and rhythm abdomen is soft nontender labs reviewed Assessment/Plan: Upper GI bleeding secondary to aspirin and nonsteroidals Plan: We discussed repeat endoscopy with he and his wife and will proceed this afternoon with further workup and plans pending those findings  Taronda Comacho E 10/27/2015, 4:04 PM

## 2015-10-27 NOTE — Op Note (Signed)
[facility] Patient Name: Miguel [name] Procedure Date : 10/27/2015 MRN: [id number] Attending MD: Clarene Essex , MD Date of Birth: 1950-03-31 CSN: [id number] Age: 65 Admit Type: Emergency Department Procedure:                Upper GI endoscopy Indications:              Acute post hemorrhagic anemia, Hematemesis, Melena Providers:                Clarene Essex, MD, Elna Breslow, RN, Ralene Bathe,                            Technician Referring MD:              Medicines:                Fentanyl 25 micrograms IV, Midazolam 5 mg IV,                            Cetacaine spray, Diphenhydramine [id number] mg IV Complications:            No immediate complications. Estimated Blood Loss:     Estimated blood loss: none. Procedure:                Pre-Anesthesia Assessment:                           - Prior to the procedure, a History and Physical                            was performed, and patient medications and                            allergies were reviewed. The patient's tolerance of                            previous anesthesia was also reviewed. The risks                            and benefits of the procedure and the sedation                            options and risks were discussed with the patient.                            All questions were answered, and informed consent                            was obtained. Prior Anticoagulants: The patient has                            taken aspirin, last dose was day of procedure. ASA                            Grade Assessment: II - A patient with mild systemic  disease. After reviewing the risks and benefits,                            the patient was deemed in satisfactory condition to                            undergo the procedure.                           After obtaining informed consent, the endoscope was                            passed under direct vision. Throughout the                             procedure, the patient's blood pressure, pulse, and                            oxygen saturations were monitored continuously. The                            was introduced through the mouth, and advanced to                            the second part of duodenum. The upper GI endoscopy                            was accomplished without difficulty. The patient                            tolerated the procedure well. Scope In: Scope Out: Findings:      The larynx was normal.      A large hiatal hernia was present.      One non-bleeding cratered gastric ulcer with adherent clot was found at       the incisura.      The duodenal bulb, first portion of the duodenum and second portion of       the duodenum were normal.      The exam was otherwise without abnormality.      Clotted blood was found in the cardia, in the gastric fundus, on the       greater curvature of the stomach and on the lesser curvature of the       stomach. Impression:               - Normal larynx.                           - Large hiatal hernia.                           - Non-bleeding gastric ulcer with adherent clot.                           - Normal duodenal bulb, first portion of the  duodenum and second portion of the duodenum.                           - The examination was otherwise normal.                           - Clotted blood in the cardia, in the gastric                            fundus, in the greater curvature of the stomach and                            in the lesser curvature of the stomach.                           - No specimens collected. Moderate Sedation:      Moderate (conscious) sedation was administered by the endoscopy nurse       and supervised by the endoscopist. The following parameters were       monitored: oxygen saturation, heart rate, blood pressure, respiratory       rate, EKG, adequacy of pulmonary ventilation, and response to  care. Recommendation:           - Clear liquid diet for 2-3 days.                           - No aspirin, ibuprofen, naproxen, or other                            non-steroidal anti-inflammatory drugs long term.                           - Use Protonix (pantoprazole) 40 mg PO BID for 3                            months.                           - Return to GI clinic in 1 week.                           - Telephone GI clinic if symptomatic PRN.                           - Repeat upper endoscopy in 1 -69month to check                            healing. hold ostomy surgery until partially sealed                            and biopsies on repeat okay to make sure this is                            not a malignant ulcer Procedure Code(s):        --- Professional ---  [id number], Esophagogastroduodenoscopy, flexible,                            transoral; diagnostic, including collection of                            specimen(s) by brushing or washing, when performed                            (separate procedure) Diagnosis Code(s):        --- Professional ---                           K44.9, Diaphragmatic hernia without obstruction or                            gangrene                           K25.4, Chronic or unspecified gastric ulcer with                            hemorrhage                           D62, Acute posthemorrhagic anemia                           K92.0, Hematemesis                           K92.1, Melena (includes Hematochezia) CPT copyright 2016 [employer]. All rights reserved. The codes documented in this report are preliminary and upon coder review may  be revised to meet current compliance requirements. Clarene Essex, MD 10/27/2015 4:52:16 PM This report has been signed electronically. Number of Addenda: 0

## 2015-10-27 NOTE — ED Provider Notes (Addendum)
[place] DEPT Provider Note   CSN: [id number] Arrival date & time: 10/27/15  1333  First Provider Contact:  First MD Initiated Contact with Patient 10/27/15 1337      Level V caveat unstable vital signs. History is obtained from EMS and from patient  History   Chief Complaint Chief Complaint  Patient presents with  . GI Bleeding   Syncope HPI Miguel [name] is a 65 y.o. male. Patient had syncopal event at home earlier today. He vomited blood while at home and vomited  of red blood in the emergency department. Patient reports using Mobicox and ibuprofen recently for back pain. Blood pressure on arrival of EMS was 60 palpated and pulse was 1:30. He was treated with saline 500 mL intravenously in the field.  HPI  Past Medical History:  Diagnosis Date  . Anemia   . Cancer ([place])   . Chronic kidney disease    KIDNEY STONE  . Hyperlipidemia     Patient Active Problem List   Diagnosis Date Noted  . Diverticulitis of colon with perforation 07/05/2015    Past Surgical History:  Procedure Laterality Date  . KNEE SURGERY  1971  . LAPAROTOMY N/A 07/05/2015   Procedure: PARTIAL SIGMOID COLECTOMY AND COLOSTOMY;  Surgeon: [name], MD;  Location: [place];  Service: General;  Laterality: N/A;  . SKIN CANCER REMOVED  2001/2009       Home Medications    Prior to Admission medications   Medication Sig Start Date End Date Taking? Authorizing Provider  acetaminophen (TYLENOL) 500 MG tablet Take 500 mg by mouth every 6 (six) hours as needed for mild pain or moderate pain.     Historical Provider, MD  aspirin EC 81 MG tablet Take 81 mg by mouth daily with supper.     Historical Provider, MD  diazepam (VALIUM) 2 MG tablet Take 1 tablet (2 mg total) by mouth every 6 (six) hours as needed for muscle spasms. 10/11/15   Lacretia Leigh, MD  lisinopril (PRINIVIL,ZESTRIL) 10 MG tablet Take 10 mg by mouth daily.    Historical Provider, MD  methocarbamol (ROBAXIN) 500 MG tablet Take 500  mg by mouth every 8 (eight) hours as needed for muscle spasms.    Historical Provider, MD  Multiple Vitamin (MULTIVITAMIN WITH MINERALS) TABS tablet Take 1 tablet by mouth every evening.     Historical Provider, MD  Omega-3 Fatty Acids (FISH OIL) 1200 MG CAPS Take 1,200 mg by mouth daily with supper.     Historical Provider, MD  oxyCODONE-acetaminophen (PERCOCET/ROXICET) 5-325 MG tablet Take 1-2 tablets by mouth every 4 (four) hours as needed for severe pain. 10/11/15   Lacretia Leigh, MD  Red Yeast Rice Extract (RED YEAST RICE PO) Take 300 mg by mouth daily with supper.     Historical Provider, MD  sulfamethoxazole-trimethoprim (BACTRIM DS,SEPTRA DS) 800-160 MG tablet Take 1 tablet by mouth 2 (two) times daily. Patient not taking: Reported on 10/11/2015 07/10/15   Erby Pian, NP  traMADol (ULTRAM) 50 MG tablet Take 50 mg by mouth every 6 (six) hours as needed for moderate pain.    Historical Provider, MD  zolpidem (AMBIEN) 10 MG tablet Take 10 mg by mouth at bedtime as needed for sleep.     Historical Provider, MD    Family History Family History  Problem Relation Age of Onset  . Stroke Mother   . Stroke Brother   . Heart disease Brother     Social History Social History  Substance  Use Topics  . Smoking status: Never Smoker  . Smokeless tobacco: Never Used  . Alcohol use No     Allergies   Review of patient's allergies indicates no known allergies.   Review of Systems Review of Systems  Unable to perform ROS: Unstable vital signs  Gastrointestinal: Positive for vomiting.       Hematemesis     Physical Exam Updated Vital Signs There were no vitals taken for this visit.  Physical Exam  Constitutional: He appears distressed.  Ill appearing alert Glasgow Coma Score 15  HENT:  Head: Normocephalic and atraumatic.  Mucous membranes pale and dry  Eyes: Conjunctivae are normal. Pupils are equal, round, and reactive to light.  Conjunctiva pale  Neck: Neck supple. No tracheal  deviation present. No thyromegaly present.  Cardiovascular: Regular rhythm.   No murmur heard. Tachycardic  Pulmonary/Chest: Effort normal and breath sounds normal.  Abdominal: Soft. Bowel sounds are normal. He exhibits no distension. There is no tenderness.  Colostomy in place  Musculoskeletal: Normal range of motion. He exhibits no edema or tenderness.  Neurological: He is alert. Coordination normal.  Skin: Skin is warm and dry. No rash noted.  Psychiatric: He has a normal mood and affect.  Nursing note and vitals reviewed.    ED Treatments / Results  Labs (all labs ordered are listed, but only abnormal results are displayed) Labs Reviewed  CBC WITH DIFFERENTIAL/PLATELET  I-STAT TROPOININ, ED  I-STAT CHEM 8, ED  PREPARE RBC (CROSSMATCH)  TYPE AND SCREEN    EKG  EKG Interpretation  Date/Time:  Monday October 27 2015 13:43:39 EDT Ventricular Rate:  114 PR Interval:    QRS Duration: 84 QT Interval:  333 QTC Calculation: 459 R Axis:   -76 Text Interpretation:  Sinus tachycardia Probable left atrial enlargement RSR' in V1 or V2, probably normal variant Inferior infarct, old SINCE LAST TRACING HEART RATE HAS INCREASED Confirmed by Winfred Leeds  MD, Damaria Stofko [phone]) on 10/27/2015 1:56:05 PM     Patient's hemodynamics improved and he felt improved after treatment with intravenous fluids and emergent transfusion with packed red cells as well as intravenous Pepcid  Radiology No results found.  Procedures Procedures (including critical care time) Results for orders placed or performed during the hospital encounter of 10/27/15  CBC with Differential/Platelet  Result Value Ref Range   WBC 11.2 (H) 4.0 - 10.5 K/uL   RBC 1.80 (L) 4.22 - 5.81 MIL/uL   Hemoglobin 5.5 (LL) 13.0 - 17.0 g/dL   HCT 16.5 (L) 39.0 - 52.0 %   MCV 91.7 78.0 - 100.0 fL   MCH 30.6 26.0 - 34.0 pg   MCHC 33.3 30.0 - 36.0 g/dL   RDW 13.4 11.5 - 15.5 %   Platelets 320 150 - 400 K/uL   Neutrophils Relative % 79 %    Neutro Abs 8.8 (H) 1.7 - 7.7 K/uL   Lymphocytes Relative 10 %   Lymphs Abs 1.1 0.7 - 4.0 K/uL   Monocytes Relative 6 %   Monocytes Absolute 0.7 0.1 - 1.0 K/uL   Eosinophils Relative 5 %   Eosinophils Absolute 0.6 0.0 - 0.7 K/uL   Basophils Relative 0 %   Basophils Absolute 0.0 0.0 - 0.1 K/uL  I-stat troponin, ED  Result Value Ref Range   Troponin i, poc 0.00 0.00 - 0.08 ng/mL   Comment 3          I-stat chem 8, ed  Result Value Ref Range   Sodium 135 135 -  145 mmol/L   Potassium 4.3 3.5 - 5.1 mmol/L   Chloride 100 (L) 101 - 111 mmol/L   BUN 19 6 - 20 mg/dL   Creatinine, Ser 1.10 0.61 - 1.24 mg/dL   Glucose, Bld 232 (H) 65 - 99 mg/dL   Calcium, Ion 1.09 (L) 1.12 - 1.23 mmol/L   TCO2 23 0 - 100 mmol/L   Hemoglobin 5.4 (LL) 13.0 - 17.0 g/dL   HCT 16.0 (L) 39.0 - 52.0 %   Comment NOTIFIED PHYSICIAN   Prepare RBC  Result Value Ref Range   Order Confirmation ORDER PROCESSED BY BLOOD BANK   Type and screen  Result Value Ref Range   ABO/RH(D) A POS    Antibody Screen NEG    Sample Expiration 10/30/2015    Unit Number [id number]    Blood Component Type RED CELLS,LR    Unit division 00    Status of Unit ISSUED    Transfusion Status OK TO TRANSFUSE Kennady Zimmerle    Crossmatch Result COMPATIBLE    Unit Number [id number]    Blood Component Type RED CELLS,LR    Unit division 00    Status of Unit ISSUED    Transfusion Status OK TO TRANSFUSE Kahil Agner    Crossmatch Result COMPATIBLE    Unit Number [id number]    Blood Component Type RBC LR PHER2    Unit division 00    Status of Unit ALLOCATED    Transfusion Status OK TO TRANSFUSE    Crossmatch Result Compatible   Prepare RBC  Result Value Ref Range   Order Confirmation ORDER PROCESSED BY BLOOD BANK    Dg Chest 2 View  Result Date: 10/11/2015 CLINICAL DATA:  Back pain for a week with new onset cough. EXAM: CHEST  2 VIEW COMPARISON:  06/20/2009 FINDINGS: The lungs are clear wiithout focal pneumonia, edema, pneumothorax  or pleural effusion. Telemetry pad over bilateral lungs. The cardiopericardial silhouette is within normal limits for size. The visualized bony structures of the thorax are intact. Hiatal hernia noted. IMPRESSION: No active cardiopulmonary disease. Electronically Signed   By: Misty Stanley M.D.   On: 10/11/2015 13:38   Dg Thoracic Spine W/swimmers  Result Date: 10/11/2015 CLINICAL DATA:  Diffuse thoracic spine pain for several months without known injury. EXAM: THORACIC SPINE - 3 VIEWS COMPARISON:  CT scan of November 03, 2013. FINDINGS: No fracture or spondylolisthesis is noted. Anterior osteophyte formation is noted in the mid thoracic spine. Disc spaces are well-maintained. IMPRESSION: Mild degenerative changes as described above. No acute abnormality seen in the thoracic spine. Electronically Signed   By: Marijo Conception, M.D.   On: 10/11/2015 16:29   Medications Ordered in ED Medications  0.9 %  sodium chloride infusion (not administered)  sodium chloride 0.9 % bolus 1,000 mL (not administered)  famotidine (PEPCID) IVPB 20 mg premix (not administered)     Initial Impression / Assessment and Plan / ED Course  I have reviewed the triage vital signs and the nursing notes.  Pertinent labs & imaging results that were available during my care of the patient were reviewed by me and considered in my medical decision making (see chart for details).  Clinical Course  I consult the gastroenterology service. Dr. Dr. Watt Climes saw patient in the emergency department.He will arrange for endoscopy I also consult reviewed hospitalist service to arrange for admission to stepdown unit.    Final Clinical Impressions(s) / ED Diagnoses  Diagnoses #1 acute upper GI bleed #2 syncope #3 hemorrhagic shock #  4hyperglycemia Final diagnoses:  None   CRITICAL CARE Performed by: Orlie Dakin Total critical care time: 40 minutes Critical care time was exclusive of separately billable procedures and treating other  patients. Critical care was necessary to treat or prevent imminent or life-threatening deterioration. Critical care was time spent personally by me on the following activities: development of treatment plan with patient and/or surrogate as well as nursing, discussions with consultants, evaluation of patient's response to treatment, examination of patient, obtaining history from patient or surrogate, ordering and performing treatments and interventions, ordering and review of laboratory studies, ordering and review of radiographic studies, pulse oximetry and re-evaluation of patient's condition. New Prescriptions New Prescriptions   No medications on file     Orlie Dakin, MD 10/27/15 [place], MD 10/27/15 1730

## 2015-10-27 NOTE — Progress Notes (Signed)
Changed patient's colostomy bag. Pt reports he changes the bag every couple of days at home. Pt's colostomy bag was full, black with what appeared to be large amounts of clots.

## 2015-10-28 ENCOUNTER — Encounter (HOSPITAL_COMMUNITY): Payer: Self-pay | Admitting: Gastroenterology

## 2015-10-28 ENCOUNTER — Inpatient Hospital Stay (HOSPITAL_COMMUNITY): Payer: 59

## 2015-10-28 DIAGNOSIS — K5720 Diverticulitis of large intestine with perforation and abscess without bleeding: Secondary | ICD-10-CM

## 2015-10-28 DIAGNOSIS — M542 Cervicalgia: Secondary | ICD-10-CM

## 2015-10-28 DIAGNOSIS — K920 Hematemesis: Secondary | ICD-10-CM

## 2015-10-28 DIAGNOSIS — R110 Nausea: Secondary | ICD-10-CM

## 2015-10-28 DIAGNOSIS — K921 Melena: Secondary | ICD-10-CM

## 2015-10-28 DIAGNOSIS — M25512 Pain in left shoulder: Secondary | ICD-10-CM | POA: Diagnosis present

## 2015-10-28 DIAGNOSIS — I9589 Other hypotension: Secondary | ICD-10-CM

## 2015-10-28 DIAGNOSIS — M6248 Contracture of muscle, other site: Secondary | ICD-10-CM

## 2015-10-28 DIAGNOSIS — M62838 Other muscle spasm: Secondary | ICD-10-CM | POA: Diagnosis present

## 2015-10-28 LAB — GLUCOSE, CAPILLARY
GLUCOSE-CAPILLARY: 109 mg/dL — AB (ref 65–99)
GLUCOSE-CAPILLARY: 117 mg/dL — AB (ref 65–99)
GLUCOSE-CAPILLARY: 126 mg/dL — AB (ref 65–99)
Glucose-Capillary: 115 mg/dL — ABNORMAL HIGH (ref 65–99)

## 2015-10-28 LAB — BASIC METABOLIC PANEL
ANION GAP: 4 — AB (ref 5–15)
BUN: 15 mg/dL (ref 6–20)
CHLORIDE: 106 mmol/L (ref 101–111)
CO2: 25 mmol/L (ref 22–32)
CREATININE: 0.92 mg/dL (ref 0.61–1.24)
Calcium: 7.5 mg/dL — ABNORMAL LOW (ref 8.9–10.3)
GFR calc non Af Amer: >60 mL/min (ref >60)
Glucose, Bld: 105 mg/dL — ABNORMAL HIGH (ref 65–99)
POTASSIUM: 3.9 mmol/L (ref 3.5–5.1)
SODIUM: 135 mmol/L (ref 135–145)

## 2015-10-28 LAB — CBC
HCT: 22 % — ABNORMAL LOW (ref 39.0–52.0)
Hemoglobin: 7.6 g/dL — ABNORMAL LOW (ref 13.0–17.0)
MCH: 31.1 pg (ref 26.0–34.0)
MCHC: 34.5 g/dL (ref 30.0–36.0)
MCV: 90.2 fL (ref 78.0–100.0)
PLATELETS: 256 10*3/uL (ref 150–400)
RBC: 2.44 MIL/uL — ABNORMAL LOW (ref 4.22–5.81)
RDW: 13.8 % (ref 11.5–15.5)
WBC: 11.2 10*3/uL — ABNORMAL HIGH (ref 4.0–10.5)

## 2015-10-28 LAB — HEMOGLOBIN AND HEMATOCRIT, BLOOD
HCT: 22.8 % — ABNORMAL LOW (ref 39.0–52.0)
Hemoglobin: 7.5 g/dL — ABNORMAL LOW (ref 13.0–17.0)

## 2015-10-28 LAB — BLOOD PRODUCT ORDER (VERBAL) VERIFICATION

## 2015-10-28 LAB — HEMOGLOBIN A1C
HEMOGLOBIN A1C: 5.9 % — AB (ref 4.8–5.6)
MEAN PLASMA GLUCOSE: 123 mg/dL

## 2015-10-28 MED ORDER — DIAZEPAM 2 MG PO TABS
2.0000 mg | ORAL_TABLET | Freq: Once | ORAL | Status: DC
  Filled 2015-10-28: qty 1

## 2015-10-28 MED ORDER — METHOCARBAMOL 1000 MG/10ML IJ SOLN
500.0000 mg | Freq: Three times a day (TID) | INTRAVENOUS | Status: DC
  Administered 2015-10-28 – 2015-10-29 (×2): 500 mg via INTRAVENOUS
  Filled 2015-10-28 (×7): qty 5

## 2015-10-28 MED ORDER — MORPHINE SULFATE (PF) 2 MG/ML IV SOLN
2.0000 mg | Freq: Once | INTRAVENOUS | Status: AC
Start: 2015-10-28 — End: 2015-10-28
  Administered 2015-10-28: 2 mg via INTRAVENOUS
  Filled 2015-10-28: qty 1

## 2015-10-28 NOTE — Progress Notes (Signed)
Miguel [name] 9:36 AM  Subjective: Patient doing well without any further nausea or vomiting but does have some black bowel movement still and his case was discussed with he and his wife again and we answered all of their questions and he has no new complaints although requests some lorazepam periodically  Objective: Vital signs stable afebrile no acute distress abdomen is soft nontender hemoglobin stable as is BUN  Assessment: Large gastric ulcer  Plan: No aspirin or nonsteroidals at home Tylenol only clear liquids today and tomorrow and if doing well can advance diet on Thursday and hopefully home Friday and twice a day pump inhibitors on discharge until repeat endoscopy in 4-6 weeks and will biopsy at that point for H. pylori and treat as an outpatient if necessary  [facility] [address]  Pager [phone] After 5PM or if no answer call [phone]

## 2015-10-28 NOTE — Progress Notes (Signed)
PROGRESS NOTE    Miguel [name]  [id number] DOB: 04/19/1950 DOA: 10/27/2015 PCP: Donnie Coffin, MD   Brief Narrative:   65 y.o. WM PMHx GI bleed related to NSAID use, Gastritis, recent Sigmoid Diverticulitis with perforation S/P Hartmann's procedure, Nephrolithiasis,  Presents to ED hematemesis and syncope. Initial evaluation reveals acute blood loss anemia likely related to GI bleed in setting of NSAID use, .  Information is obtained from the chart and the patient and the wife who is at the bedside. Wife reports patient with 2 episodes of vomiting large amounts bright red blood. EMS was called and reportedly patient was pale diaphoretic but alert. He then had an episode of emesis bright red blood of 250cc. his blood pressure 60 palp with a heart rate of 1:30 he was immediately given 500 mL of normal saline. Patient reports several weeks he started feeling "not too good". He complains of intermittent nausea without emesis some shortness of breath with exertion and worsening chronic left shoulder pain. In addition he was having a feeling of constipation for which he took miralax. He denies abdominal pain but does endorse continuous NSAID use over the years. Of note in April of this he had perforated sigmoid diverticulitis with surgical repair hartmann procedure and colostomy. He is scheduled to have colostomy reversed August 10. He denies any fever chills cough abdominal pain dysuria hematuria frequency or urgency.   Subjective:   Assessment & Plan:   Principal Problem:   Acute blood loss anemia Active Problems:   Diverticulitis of colon with perforation   GI bleed   Syncope   Hyperglycemia   Hypotension   Neck pain   Hematemesis   Hematochezia   Left shoulder pain   Muscle spasm of left shoulder   Trapezius muscle spasm   Acute Blood loss anemia.  -7/31 transfuse 3 units PRBC -Monitor hemoglobin BID -S/P EGD:. Gastric ulcer see results below  GI bleed/Hematemesis/  Hematochezia -Prior bleed related to NSAID overuse  -Hold NSAIDs -Clear liquid diet will advance as tolerated -Protonix 40 mg BID  - Of note patient underwent Hartmann procedure in April of this year scheduled for colostomy reversal August 10 of this year . Hypotension. - Likely related to volume loss secondary  -Hold home BP medication   Hyperglycemia.  -Obtain a hemoglobin A1c -Monitor CBGs -Use sensitive SSI  Syncope.  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 Clarene [facility]   Procedures/Significant Events:  7/31 EGD: Large hiatal hernia-nonbleeding gastric ulcer with adherent clot-clotted blood in the cardia, gastric fundus, greater curvature of the stomach, lesser curvature of the stomach  Cultures   Antimicrobials:    Devices    LINES / TUBES:      Continuous Infusions: . sodium chloride       Objective: Vitals:   10/28/15 0803 10/28/15 1138 10/28/15 1559 10/28/15 2002  BP: 113/61 121/68 128/76 (!) 141/95  Pulse: 87 82 88 (!) 109  Resp: 20 19 14  (!) 22  Temp: 98.4 F (36.9 C) 99 F (37.2 C) 98.4 F (36.9 C) 99.3 F (37.4 C)  TempSrc: Oral Oral Oral Oral  SpO2: 97% 97% 99% 100%  Weight:  Height:        Intake/Output Summary (Last 24 hours) at 10/28/15 2049 Last data filed at 10/28/15 1559  Gross per 24 hour  Intake             1790 ml  Output             1900 ml  Net             -110 ml   Filed Weights   10/27/15 1719 10/28/15 0024  Weight: 72.4 kg (159 lb 9.8 oz) 73.8 kg (162 lb 11.2 oz)    Examination:  General:A/O 4, positive bilateral shoulder pai, n No acute respiratory distress Eyes:  negative scleral hemorrhage, negative anisocoria, negative icterus ENT: Negative Runny nose, negative gingival bleeding, Neck:  Negative scars, masses, torticollis, lymphadenopathy, JVD Lungs: Clear to auscultation bilaterally without wheezes or crackles Cardiovascular: Regular rate and rhythm without murmur gallop or rub normal S1 and S2 Abdomen: negative abdominal pain, nondistended, hypoactive bowel sounds, RUQ ostomy bag and placed with black fluid/stool, no ascites, no appreciable mass Extremities: No significant cyanosis, clubbing, or edema bilateral lower extremities: Bilateral upper trapezius muscle spasm and C-spine paracervical muscle spasm Lt>>>Rt Skin: Negative rashes, lesions, ulcers Psychiatric:  Negative depression, negative anxiety, negative fatigue, negative mania  Central nervous system:  Cranial nerves II through XII intact, tongue/uvula midline, all extremities muscle strength 5/5, sensation intact throughout, negative dysarthria, negative expressive aphasia, negative receptive aphasia.  .     Data Reviewed: Care during the described time interval was provided by me .  I have reviewed this patient's available data, including medical history, events of note, physical examination, and all test results as part of my evaluation. I have personally reviewed and interpreted all radiology studies.  CBC:  Recent Labs Lab 10/27/15 1350 10/27/15 1401 10/27/15 2026 10/28/15 0431  WBC 11.2*  --  11.2* 11.2*  NEUTROABS 8.8*  --   --   --   HGB 5.5* 5.4* 7.0* 7.6*  HCT 16.5* 16.0* 20.5* 22.0*  MCV 91.7  --  88.7 90.2  PLT 320  --  262 [id number]   Basic Metabolic Panel:  Recent Labs Lab 10/27/15 1401 10/28/15 0431  NA 135 135  K 4.3 3.9  CL 100* 106  CO2  --  25  GLUCOSE 232* 105*  BUN 19 15  CREATININE 1.10 0.92  CALCIUM  --  7.5*   GFR: Estimated Creatinine Clearance: 75.2 mL/min (by C-G formula based on SCr of 0.92 mg/dL). Liver Function Tests: No results for  input(s): AST, ALT, ALKPHOS, BILITOT, PROT, ALBUMIN in the last 168 hours. No results for input(s): LIPASE, AMYLASE in the last 168 hours. No results for input(s): AMMONIA in the last 168 hours. Coagulation Profile: No results for input(s): INR, PROTIME in the last 168 hours. Cardiac Enzymes: No results for input(s): CKTOTAL, CKMB, CKMBINDEX, TROPONINI in the last 168 hours. BNP (last 3 results) No results for input(s): PROBNP in the last 8760 hours. HbA1C:  Recent Labs  10/27/15 1350  HGBA1C 5.9*   CBG:  Recent Labs Lab 10/27/15 1748 10/27/15 2112 10/28/15 0801 10/28/15 1136 10/28/15 1721  GLUCAP 89 145* 109* 117* 115*   Lipid Profile: No results for input(s): CHOL, HDL, LDLCALC, TRIG, CHOLHDL, LDLDIRECT in the last 72 hours. Thyroid Function Tests: No results for input(s): TSH, T4TOTAL, FREET4, T3FREE, THYROIDAB in the last 72 hours. Anemia Panel: No results for input(s): VITAMINB12, FOLATE, FERRITIN, TIBC, IRON, RETICCTPCT in the last 72 hours. Urine analysis:  Component Value Date/Time   COLORURINE YELLOW 10/11/2015 1459   APPEARANCEUR CLEAR 10/11/2015 1459   LABSPEC 1.011 10/11/2015 1459   PHURINE 6.0 10/11/2015 1459   GLUCOSEU NEGATIVE 10/11/2015 1459   HGBUR NEGATIVE 10/11/2015 1459   BILIRUBINUR NEGATIVE 10/11/2015 1459   KETONESUR NEGATIVE 10/11/2015 1459   PROTEINUR NEGATIVE 10/11/2015 1459   UROBILINOGEN 0.2 11/03/2013 0629   NITRITE NEGATIVE 10/11/2015 1459   LEUKOCYTESUR NEGATIVE 10/11/2015 1459   Sepsis Labs: @LABRCNTIP (procalcitonin:4,lacticidven:4)  ) Recent Results (from the past 240 hour(s))  MRSA PCR Screening     Status: None   Collection Time: 10/27/15  5:28 PM  Result Value Ref Range Status   MRSA by PCR NEGATIVE NEGATIVE Final    Comment:        The GeneXpert MRSA Assay (FDA approved for NASAL specimens only), is one component of a comprehensive MRSA colonization surveillance program. It is not intended to diagnose  MRSA infection nor to guide or monitor treatment for MRSA infections.          Radiology Studies: Dg Shoulder Left  Result Date: 10/28/2015 CLINICAL DATA:  Acute on chronic left shoulder pain without known injury. EXAM: LEFT SHOULDER - 2+ VIEW COMPARISON:  None. FINDINGS: There is no evidence of fracture or dislocation. There is no evidence of arthropathy or other focal bone abnormality. Soft tissues are unremarkable. IMPRESSION: Normal left shoulder. Electronically Signed   By: Marijo Conception, M.D.   On: 10/28/2015 15:34        Scheduled Meds: . sodium chloride   Intravenous Once  . famotidine (PEPCID) IV  20 mg Intravenous Q12H  . feeding supplement  1 Container Oral TID BM  . insulin aspart  0-5 Units Subcutaneous QHS  . insulin aspart  0-9 Units Subcutaneous TID WC  . methocarbamol (ROBAXIN)  IV  500 mg Intravenous Q8H  . pantoprazole  40 mg Oral BID  . sodium chloride flush  3 mL Intravenous Q12H   Continuous Infusions: . sodium chloride       LOS: 1 day    Time spent: 40 minutes    Pierrette Scheu, Geraldo Docker, MD [facility] Pager [phone]   If 7PM-7AM, please contact night-coverage [email or web address] Password [facility] 10/28/2015, 8:49 PM

## 2015-10-28 NOTE — Progress Notes (Addendum)
Initial Nutrition Assessment  DOCUMENTATION CODES:   Not applicable  INTERVENTION:    Boost Breeze po TID, each supplement provides 250 kcal and 9 grams of protein  NUTRITION DIAGNOSIS:   Inadequate oral intake related to altered GI function as evidenced by clear liquid diet.  GOAL:   Patient will meet greater than or equal to 90% of their needs  MONITOR:   PO intake, Supplement acceptance, Diet advancement, I & O's  REASON FOR ASSESSMENT:   Malnutrition Screening Tool    ASSESSMENT:   65 y.o. male with a Past Medical History of CKD, gastritis, HLD, diverticulosis w/ perforation,  who presents with acute GI bleed in need of EGD.  S/P EGD on 7/31 which revealed large hiatal hernia, non-bleeding gastric ulcer with adherent clot. Colostomy output black this morning. Plans to continue clear liquids for the next 2 days with possible diet advancement on Thursday.  Labs reviewed. CBG's: [id number] Medications reviewed and include Novolog. Unable to complete Nutrition-Focused physical exam at this time.  Patient is at nutrition risk, given recent weight loss and clear liquid diet.   Diet Order:  Diet clear liquid Room service appropriate? Yes; Fluid consistency: Thin  Skin:  Reviewed, no issues  Last BM:  8/1 (colostomy)  Height:   Ht Readings from Last 1 Encounters:  10/27/15 5\' 5"  (1.651 m)    Weight:   Wt Readings from Last 1 Encounters:  10/28/15 162 lb 11.2 oz (73.8 kg)    Ideal Body Weight:  61.8 kg  BMI:  Body mass index is 27.07 kg/m.  Estimated Nutritional Needs:   Kcal:  1800-2000  Protein:  85-100 gm  Fluid:  1.8-2 L  EDUCATION NEEDS:   No education needs identified at this time  Molli Barrows, [place], [place], [place] Pager [phone] After Hours Pager [phone]

## 2015-10-29 ENCOUNTER — Encounter (HOSPITAL_COMMUNITY)
Admission: EM | Disposition: A | Discharge status: Home Health | Payer: Self-pay | Source: Home / Self Care | Attending: Family Medicine

## 2015-10-29 ENCOUNTER — Encounter (HOSPITAL_COMMUNITY): Payer: Self-pay | Admitting: *Deleted

## 2015-10-29 ENCOUNTER — Ambulatory Visit: Payer: 59 | Admitting: Physical Therapy

## 2015-10-29 HISTORY — PX: ESOPHAGOGASTRODUODENOSCOPY: SHX5428

## 2015-10-29 LAB — GLUCOSE, CAPILLARY
GLUCOSE-CAPILLARY: 159 mg/dL — AB (ref 65–99)
GLUCOSE-CAPILLARY: 128 mg/dL — AB (ref 65–99)
GLUCOSE-CAPILLARY: 109 mg/dL — AB (ref 65–99)
Glucose-Capillary: 144 mg/dL — ABNORMAL HIGH (ref 65–99)

## 2015-10-29 LAB — CBC
HCT: 24 % — ABNORMAL LOW (ref 39.0–52.0)
HEMOGLOBIN: 8 g/dL — AB (ref 13.0–17.0)
MCH: 30.2 pg (ref 26.0–34.0)
MCHC: 33.3 g/dL (ref 30.0–36.0)
MCV: 90.6 fL (ref 78.0–100.0)
PLATELETS: 253 10*3/uL (ref 150–400)
RBC: 2.65 MIL/uL — AB (ref 4.22–5.81)
RDW: 14.3 % (ref 11.5–15.5)
WBC: 16.9 10*3/uL — AB (ref 4.0–10.5)

## 2015-10-29 LAB — HEMOGLOBIN AND HEMATOCRIT, BLOOD
HCT: 19.8 % — ABNORMAL LOW (ref 39.0–52.0)
HEMOGLOBIN: 6.5 g/dL — AB (ref 13.0–17.0)

## 2015-10-29 LAB — PROTIME-INR
INR: 1.28
Prothrombin Time: 16.1 seconds — ABNORMAL HIGH (ref 11.4–15.2)

## 2015-10-29 LAB — APTT: APTT: 29 s (ref 24–36)

## 2015-10-29 LAB — PREPARE RBC (CROSSMATCH)

## 2015-10-29 SURGERY — EGD (ESOPHAGOGASTRODUODENOSCOPY)
Anesthesia: Moderate Sedation

## 2015-10-29 MED ORDER — SODIUM CHLORIDE 0.9 % IV SOLN
Freq: Once | INTRAVENOUS | Status: AC
  Administered 2015-10-29: 21:00:00 via INTRAVENOUS

## 2015-10-29 MED ORDER — BUTAMBEN-TETRACAINE-BENZOCAINE 2-2-14 % EX AERO
INHALATION_SPRAY | CUTANEOUS | Status: DC | PRN
  Administered 2015-10-29: 2 via TOPICAL

## 2015-10-29 MED ORDER — SODIUM CHLORIDE 0.9 % IV SOLN
80.0000 mg | Freq: Once | INTRAVENOUS | Status: AC
  Administered 2015-10-29: 80 mg via INTRAVENOUS
  Filled 2015-10-29: qty 80

## 2015-10-29 MED ORDER — SODIUM CHLORIDE 0.9 % IV SOLN: Freq: Once | INTRAVENOUS | Status: DC

## 2015-10-29 MED ORDER — SODIUM CHLORIDE 0.9 % IV SOLN: INTRAVENOUS | Status: DC

## 2015-10-29 MED ORDER — SODIUM CHLORIDE 0.9 % IV SOLN
INTRAVENOUS | Status: DC
  Administered 2015-10-29 – 2015-11-02 (×9): via INTRAVENOUS

## 2015-10-29 MED ORDER — SODIUM CHLORIDE 0.9 % IV SOLN
8.0000 mg/h | INTRAVENOUS | Status: DC
  Administered 2015-10-29 – 2015-10-31 (×4): 8 mg/h via INTRAVENOUS
  Filled 2015-10-29 (×10): qty 80

## 2015-10-29 MED ORDER — PANTOPRAZOLE SODIUM 40 MG IV SOLR: 40.0000 mg | Freq: Two times a day (BID) | INTRAVENOUS | Status: DC

## 2015-10-29 MED ORDER — MIDAZOLAM HCL 5 MG/ML IJ SOLN
INTRAMUSCULAR | Status: AC
  Filled 2015-10-29: qty 3

## 2015-10-29 MED ORDER — FENTANYL CITRATE (PF) 100 MCG/2ML IJ SOLN
INTRAMUSCULAR | Status: AC
  Filled 2015-10-29: qty 4

## 2015-10-29 MED ORDER — DIPHENHYDRAMINE HCL 50 MG/ML IJ SOLN
INTRAMUSCULAR | Status: DC | PRN
  Administered 2015-10-29: 12.5 mg via INTRAVENOUS

## 2015-10-29 MED ORDER — FENTANYL CITRATE (PF) 100 MCG/2ML IJ SOLN
INTRAMUSCULAR | Status: DC | PRN
  Administered 2015-10-29: 25 ug via INTRAVENOUS

## 2015-10-29 MED ORDER — DIPHENHYDRAMINE HCL 50 MG/ML IJ SOLN
INTRAMUSCULAR | Status: AC
  Filled 2015-10-29: qty 1

## 2015-10-29 MED ORDER — SODIUM CHLORIDE 0.9 % IV BOLUS (SEPSIS)
1000.0000 mL | Freq: Once | INTRAVENOUS | Status: AC
Start: 2015-10-29 — End: 2015-10-29
  Administered 2015-10-29: 1000 mL via INTRAVENOUS

## 2015-10-29 MED ORDER — MIDAZOLAM HCL 10 MG/2ML IJ SOLN
INTRAMUSCULAR | Status: DC | PRN
  Administered 2015-10-29 (×2): 2 mg via INTRAVENOUS
  Administered 2015-10-29: 1 mg via INTRAVENOUS

## 2015-10-29 NOTE — Op Note (Signed)
[facility] Patient Name: Miguel [name] Procedure Date : 10/29/2015 MRN: [id number] Attending MD: Ronald Lobo , MD Date of Birth: 1950/10/30 CSN: [id number] Age: 65 Admit Type: Inpatient Procedure:                Upper GI endoscopy Indications:              Hematemesis--recurrent hematemesis in the 48 hrs                            since previous egd showed a large ulcer with                            overlying clot at the incisura of the stomach Providers:                Ronald Lobo, MD, Elna Breslow, RN, William Dalton, Technician Referring MD:              Medicines:                Diphenhydramine 12.5 mg IV, Fentanyl 25 micrograms                            IV, Midazolam 5 mg IV, Cetacaine spray Complications:            No immediate complications. Estimated Blood Loss:     Estimated blood loss: none. Procedure:                Pre-Anesthesia Assessment:                           - Prior to the procedure, a History and Physical                            was performed, and patient medications and                            allergies were reviewed. The patient's tolerance of                            previous anesthesia was also reviewed. The risks                            and benefits of the procedure and the sedation                            options and risks were discussed with the patient.                            All questions were answered, and informed consent                            was obtained. Prior Anticoagulants: The patient has  taken no previous anticoagulant or antiplatelet                            agents. ASA Grade Assessment: IV - A patient with                            severe systemic disease that is a constant threat                            to life. After reviewing the risks and benefits,                            the patient was deemed in satisfactory condition to                             undergo the procedure.                           After obtaining informed consent, the endoscope was                            passed under direct vision. Throughout the                            procedure, the patient's blood pressure, pulse, and                            oxygen saturations were monitored continuously. The                            [id number]) scope was introduced through the                            mouth, and advanced to the body of the stomach. The                            upper GI endoscopy was technically difficult and                            complex due to abnormal anatomy (J-shaped stomach)                            and poor endoscopic visualization due to massive                            amount of retained blood and clot in the stomach.                            The patient tolerated the procedure well. Scope In: Scope Out: Findings:      The larynx was normal.      Dark red blood was found in the lower third of the esophagus.      The exam of the esophagus was otherwise normal.  There is no endoscopic evidence of varices or Mallory-Weiss tear in the       entire esophagus.      A 5 cm hiatal hernia was present.      Clotted blood and a large amount (500-700 mL) of dark maroon/burgundy       liquid blood was found in the entire examined stomach. NO FRESH RED       BLOOD or active bleeding were seen. The previously-observed ulcer was       unable to be visualized on this exam, due to the large amount of blood       and the inability to suction out most of the blood because of the       presence of clots. Where visualized, the gastric mucosa appeared normal.      The cardia and gastric fundus were normal on retroflexion.      I was UNABLE TO ENTER THE DUODENUM due to atypical anatomy (J-shaped       stomach) and the large amount of blood that placed the patient at risk       for aspiration. The antrum and pylorus  were never visualized. Impression:               - Normal larynx.                           - Red blood in the lower third of the esophagus.                           - 5 cm hiatal hernia.                           - Clotted blood in the entire stomach.                           - Unable to visualize known gastric ulcer, or to                            examine the distal stomach or duodenum (see above                            discussion).                           - No specimens collected. Moderate Sedation:      Moderate (conscious) sedation was administered by the endoscopy nurse       and supervised by the endoscopist. The following parameters were       monitored: oxygen saturation, heart rate, blood pressure, and response       to care. Total physician intraservice time was 18 minutes. Recommendation:           - Repeat upper endoscopy tomorrow because the                            preparation was poor.                           - Refer to a surgeon today, to be on standby in the  event of recurrent, destabilizing bleeding tonight.                           - Make the patient NPO starting today.                           - Continue present medications. Procedure Code(s):        --- Professional ---                           [phone], 52, Esophagogastroduodenoscopy, flexible,                            transoral; diagnostic, including collection of                            specimen(s) by brushing or washing, when performed                            (separate procedure) Diagnosis Code(s):        --- Professional ---                           K92.2, Gastrointestinal hemorrhage, unspecified                           K92.0, Hematemesis CPT copyright 2016 [employer]. All rights reserved. The codes documented in this report are preliminary and upon coder review may  be revised to meet current compliance requirements. Ronald Lobo,  MD 10/29/2015 7:49:04 PM This report has been signed electronically. Number of Addenda: 0

## 2015-10-29 NOTE — Progress Notes (Signed)
Addendum to previous note:  Have discussed above plan w/ pt's family, and with Dr. Thereasa Solo, who plans to order 2 u prc's this evening.   Cleotis Nipper, M.D. Pager [phone] If no answer or after 5 PM call [phone]

## 2015-10-29 NOTE — Progress Notes (Signed)
Pt vomitted approximately 400cc of  bright burgundy liquid with blood clots. Notified Dr. Watt Climes and new orders rec'd to do EGD on pt tonight. Will continue to monitor pt further.

## 2015-10-29 NOTE — Progress Notes (Signed)
Endoscopy showed large amount (?500 mL???) of burgundy liquid bld and clots.  No fresh bright red blood or active bleeding noted.  Unable to visualize ulcer or even reach the duodenum b/o the large amount of residual blood.  No evidence of regurgitation/aspiration during procedure.  Have spoken w/ Dr. Serita Grammes of Gen. Surgery--he feels (and I agree) that surgical intervention is not called for at this moment but he is on standby if the patient destabilizes.  We will tentatively plan on repeat EGD tomorrow morning--I will give IV Reglan in the meantime, with the hope of clearing the stomach of blood.  Cleotis Nipper, M.D. Pager [phone] If no answer or after 5 PM call [phone]

## 2015-10-29 NOTE — Progress Notes (Signed)
CRITICAL VALUE ALERT  Critical value received:  Hgb 6.5  Date of notification:  10/28/15   Time of notification:  0648  Critical value read back:Yes.    Nurse who received alert:  Donna Christen   MD notified (1st page):  Forrest Moron   Time of first page:  0650  MD notified (2nd page):  Time of second page:  Responding MD:  Forrest Moron   Time MD responded:  [phone]

## 2015-10-29 NOTE — Progress Notes (Signed)
Received lab report of Hgb 6.5 value given to primary RN Truman Hayward.

## 2015-10-29 NOTE — Progress Notes (Signed)
[name] 1:11 PM  Subjective: Patient through up first some dark material earlier today and then some bright red blood but unfortunately our service was not contacted and he has been taking clear liquids and has not had any bowel movements and his muscle relaxer for his shoulder did not help but he has no new complaints and his case was discussed with his family as well  Objective: Vital signs stable afebrile no acute distress abdomen is soft nontender hemoglobin drop no new BUN  Assessment: Severe ulcer disease would have made him nothing by mouth if alerted earlier  Plan: Will plan endoscopy tomorrow however if signs of bleeding please call sooner so we can proceed urgently and if he does not have any signs of further bleeding will proceed tomorrow as above [facility]  Pager [phone] After 5PM or if no answer call [phone]

## 2015-10-29 NOTE — Progress Notes (Signed)
Heard Noise from patients room arrived within seconds to find patient face down in floor with approz 10-15 ml of bright red blood and clots coming from Patients mouth. Patient unconscious for approximately 30 seconds. Patient became responsive and oriented. Assisted patient back to bed with coworkers. BP 85/43 sinus tach 120's . MD notified orders for 1000 ml bolus and 2 units PRBC's. Bolus started pat comfortable in bed still complaining of some dizziness. Made MD aware as well as oncoming day shift aware. Report given to oncoming RN. Patient resting.

## 2015-10-29 NOTE — Progress Notes (Signed)
TEAM 1 - Stepdown/ICU TEAM  Miguel [name]  [id number] DOB: 09/24/1950 DOA: 10/27/2015 PCP: Donnie Coffin, MD    Brief Narrative:  65 y.o.M Hx GI bleed related to NSAID use, Gastritis, recent Sigmoid Diverticulitis with perforation S/P Hartmann's procedure, and Nephrolithiasis who presented to the ED c/o hematemesis and syncope.  Wife reported 2 episodes of vomiting large amounts bright red blood. EMS was called and found patient pale and diaphoretic but alert. He then had another episode of emesis bright red blood of 250cc. He denied abdominal pain but did endorse continuous NSAID use over the years. April 2017 he had a perforated sigmoid diverticulitis with surgical repair hartmann procedure and colostomy. He is scheduled to have colostomy reversed August 10.   Subjective: The patient reports an episode of hematemesis last night and another early this morning.  He has otherwise felt good throughout the day.  This evening he has had another episode of impressive hematemesis.  GI has been contacted and the plan is to pursue repeat EGD tonight.  He currently denies chest pain nausea vomiting abdominal pain shortness breath lightheadedness or dizziness.  She states that he feels much better after having vomited.  Assessment & Plan:  Acute Blood loss anemia Continue to transfuse as required to keep hemoglobin at 7.0 or greater - repeat CBC pending presently  UGI bleed / Hematemesis -Prior bleed related to NSAID overuse  -EGD 7/31 noted large HH and gastric ulcer with adherent clot -Hopeful that intervention will be possible with repeat EGD  Hypotension. -due to signif acute blood loss - volume resuscitate and transfuse further as indicated  Hyperglycemia.  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 rate 17, height 5\' 5"  (1.651 m), weight 73.8 kg (162 lb 11.2 oz), SpO2 100 %.  Intake/Output Summary (Last 24 hours) at 10/29/15 1638 Last data filed at 10/29/15 1426  Gross per 24 hour  Intake          2172.67 ml  Output              625 ml  Net          1547.67 ml   Filed Weights   10/27/15 1719 10/28/15 0024  Weight: 72.4 kg (159 lb 9.8 oz) 73.8 kg (162 lb 11.2 oz)    Examination: General: No acute respiratory distress Lungs: Clear to auscultation bilaterally without wheezes or crackles Cardiovascular: Regular rate and rhythm without murmur gallop or rub normal S1 and S2 Abdomen: Nontender, nondistended, soft, bowel sounds positive, no rebound, no ascites, no appreciable mass Extremities: No significant cyanosis, clubbing, or edema bilateral lower extremities  CBC:  Recent Labs Lab 10/27/15 1350 10/27/15 1401 10/27/15 2026 10/28/15 0431 10/28/15 2046 10/29/15 0608  WBC 11.2*  --  11.2* 11.2*  --   --   NEUTROABS 8.8*  --   --   --   --   --   HGB 5.5* 5.4* 7.0* 7.6* 7.5* 6.5*  HCT 16.5* 16.0* 20.5* 22.0* 22.8* 19.8*  MCV 91.7  --  88.7 90.2  --   --  PLT 320  --  262 256  --   --    Basic Metabolic Panel:  Recent Labs Lab 10/27/15 1401 10/28/15 0431  NA 135 135  K 4.3 3.9  CL 100* 106  CO2  --  25  GLUCOSE 232* 105*  BUN 19 15  CREATININE 1.10 0.92  CALCIUM  --  7.5*   GFR: Estimated Creatinine Clearance: 75.2 mL/min (by C-G formula based on SCr of 0.92 mg/dL).  Liver Function Tests: No results for input(s): AST, ALT, ALKPHOS, BILITOT, PROT, ALBUMIN in the last 168 hours. No results for input(s): LIPASE, AMYLASE in the last 168 hours. No results for input(s):  AMMONIA in the last 168 hours.   HbA1C: Hgb A1c MFr Bld  Date/Time Value Ref Range Status  10/27/2015 01:50 PM 5.9 (H) 4.8 - 5.6 % Final    Comment:    (NOTE)         Pre-diabetes: 5.7 - 6.4         Diabetes: >6.4         Glycemic control for adults with diabetes: <7.0     CBG:  Recent Labs Lab 10/28/15 1136 10/28/15 1721 10/28/15 2111 10/29/15 0742 10/29/15 1142  GLUCAP 117* 115* 126* 159* 128*    Recent Results (from the past 240 hour(s))  MRSA PCR Screening     Status: None   Collection Time: 10/27/15  5:28 PM  Result Value Ref Range Status   MRSA by PCR NEGATIVE NEGATIVE Final    Comment:        The GeneXpert MRSA Assay (FDA approved for NASAL specimens only), is one component of a comprehensive MRSA colonization surveillance program. It is not intended to diagnose MRSA infection nor to guide or monitor treatment for MRSA infections.      Scheduled Meds: . sodium chloride   Intravenous Once  . sodium chloride   Intravenous Once  . diazepam  2 mg Oral Once  . famotidine (PEPCID) IV  20 mg Intravenous Q12H  . feeding supplement  1 Container Oral TID BM  . insulin aspart  0-5 Units Subcutaneous QHS  . insulin aspart  0-9 Units Subcutaneous TID WC  . methocarbamol (ROBAXIN)  IV  500 mg Intravenous Q8H  . pantoprazole  40 mg Oral BID  . sodium chloride flush  3 mL Intravenous Q12H     LOS: 2 days    Cherene Altes, MD [facility] Office  [phone] Pager - Text Page per Shea Evans as per below:  On-Call/Text Page:      [email or web address]      password [email or web address]  If 7PM-7AM, please contact night-coverage [email or web address] Password [facility] 10/29/2015, 4:38 PM

## 2015-10-29 NOTE — Consult Note (Signed)
Reason for Consult:UGI bleed Referring Physician: Dr Arnell Sieving  Miguel [name] is an 65 y.o. male.  HPI: 76 yom admitted with GIbleed related to NSAID use, sigmoid diverticulitis with perforation S/P Hartmann's procedure who presented to the ED c/o hematemesis and syncope.  He had called our office a couple weeks ago with black stool in stoma.  Wife reported 2 episodes of vomiting large amounts bright red blood. EMS was called and found patient pale and diaphoretic but alert. Marland Kitchen He denied abdominal pain but did endorse continuous NSAID use over the years. April 2017 he had a perforated sigmoid diverticulitis with surgical repair hartmann procedure and colostomy. He is scheduled to have colostomy reversed August 10. He has received several units of blood and has had 2 egds.  First showed lc ulcer with adherent clot. Second showed clot unable to visualize ulcer. He is not actively bleeding right now  Past Medical History:  Diagnosis Date  . Anemia   . Anxiety   . Gastritis   . GI bleed due to NSAIDs 10/27/2015  . Hyperlipidemia   . Hypertension   . Kidney stones   . 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 Sigmoid diverticulitis    with perforation    Past Surgical History:  Procedure Laterality Date  . COLON SURGERY    . ESOPHAGOGASTRODUODENOSCOPY N/A 10/27/2015   Procedure: ESOPHAGOGASTRODUODENOSCOPY (EGD);  Surgeon: Clarene Essex, MD;  Location: [facility] ENDOSCOPY;  Service: Endoscopy;  Laterality: N/A;  . HEMORRHOID BANDING  X 2  . KNEE CARTILAGE SURGERY Right 1971   "opened me up"  . LAPAROTOMY N/A 07/05/2015   Procedure: PARTIAL SIGMOID COLECTOMY AND COLOSTOMY;  Surgeon: [name], MD;  Location: [place];  Service: General;  Laterality: N/A;  . MELANOMA EXCISION  2001  . TUMOR EXCISION  2009   "back; fatty tumor"    Family History  Problem Relation Age of Onset  . Stroke Mother   . Stroke Brother   . Heart disease Brother     Social History:  reports that he has never  smoked. He quit smokeless tobacco use about 3 months ago. His smokeless tobacco use included Snuff. He reports that he does not drink alcohol or use drugs.  Allergies: No Known Allergies  Medications:I have reviewed patients meds  Results for orders placed or performed during the hospital encounter of 10/27/15 (from the past 48 hour(s))  CBC     Status: Abnormal   Collection Time: 10/27/15  8:26 PM  Result Value Ref Range   WBC 11.2 (H) 4.0 - 10.5 K/uL   RBC 2.31 (L) 4.22 - 5.81 MIL/uL   Hemoglobin 7.0 (L) 13.0 - 17.0 g/dL    Comment: REPEATED TO VERIFY POST TRANSFUSION SPECIMEN    HCT 20.5 (L) 39.0 - 52.0 %   MCV 88.7 78.0 - 100.0 fL   MCH 30.3 26.0 - 34.0 pg   MCHC 34.1 30.0 - 36.0 g/dL   RDW 13.7 11.5 - 15.5 %   Platelets 262 150 - 400 K/uL  Glucose, capillary     Status: Abnormal   Collection Time: 10/27/15  9:12 PM  Result Value Ref Range   Glucose-Capillary 145 (H) 65 - 99 mg/dL  Basic metabolic panel     Status: Abnormal   Collection Time: 10/28/15  4:31 AM  Result Value Ref Range   Sodium 135 135 - 145 mmol/L   Potassium 3.9 3.5 - 5.1 mmol/L   Chloride 106 101 - 111  mmol/L   CO2 25 22 - 32 mmol/L   Glucose, Bld 105 (H) 65 - 99 mg/dL   BUN 15 6 - 20 mg/dL   Creatinine, Ser 0.92 0.61 - 1.24 mg/dL   Calcium 7.5 (L) 8.9 - 10.3 mg/dL   GFR calc non Af Amer >60 >60 mL/min   GFR calc Af Amer >60 >60 mL/min    Comment: (NOTE) The eGFR has been calculated using the CKD EPI equation. This calculation has not been validated in all clinical situations. eGFR's persistently <60 mL/min signify possible Chronic Kidney Disease.    Anion gap 4 (L) 5 - 15  CBC     Status: Abnormal   Collection Time: 10/28/15  4:31 AM  Result Value Ref Range   WBC 11.2 (H) 4.0 - 10.5 K/uL   RBC 2.44 (L) 4.22 - 5.81 MIL/uL   Hemoglobin 7.6 (L) 13.0 - 17.0 g/dL   HCT 22.0 (L) 39.0 - 52.0 %   MCV 90.2 78.0 - 100.0 fL   MCH 31.1 26.0 - 34.0 pg   MCHC 34.5 30.0 - 36.0 g/dL   RDW 13.8 11.5 -  15.5 %   Platelets 256 150 - 400 K/uL  Glucose, capillary     Status: Abnormal   Collection Time: 10/28/15  8:01 AM  Result Value Ref Range   Glucose-Capillary 109 (H) 65 - 99 mg/dL   Comment 1 Notify RN    Comment 2 Document in Chart   Glucose, capillary     Status: Abnormal   Collection Time: 10/28/15 11:36 AM  Result Value Ref Range   Glucose-Capillary 117 (H) 65 - 99 mg/dL   Comment 1 Notify RN    Comment 2 Document in Chart   Glucose, capillary     Status: Abnormal   Collection Time: 10/28/15  5:21 PM  Result Value Ref Range   Glucose-Capillary 115 (H) 65 - 99 mg/dL   Comment 1 Notify RN    Comment 2 Document in Chart   Hemoglobin and hematocrit, blood     Status: Abnormal   Collection Time: 10/28/15  8:46 PM  Result Value Ref Range   Hemoglobin 7.5 (L) 13.0 - 17.0 g/dL   HCT 22.8 (L) 39.0 - 52.0 %  Glucose, capillary     Status: Abnormal   Collection Time: 10/28/15  9:11 PM  Result Value Ref Range   Glucose-Capillary 126 (H) 65 - 99 mg/dL  Provider-confirm verbal Blood Bank order - RBC; 2 Units; Order taken: 10/27/2015; 2:00 PM; Patient actively bleeding     Status: None   Collection Time: 10/28/15 10:30 PM  Result Value Ref Range   Blood product order confirm MD AUTHORIZATION REQUESTED   Hemoglobin and hematocrit, blood     Status: Abnormal   Collection Time: 10/29/15  6:08 AM  Result Value Ref Range   Hemoglobin 6.5 (LL) 13.0 - 17.0 g/dL    Comment: REPEATED TO VERIFY SPECIMEN CHECKED FOR CLOTS CRITICAL RESULT CALLED TO, READ BACK BY AND VERIFIED WITH: K HAGERMAN,RN AT 1610 10/29/15 BY G MCADOO    HCT 19.8 (L) 39.0 - 52.0 %  Prepare RBC     Status: None   Collection Time: 10/29/15  6:37 AM  Result Value Ref Range   Order Confirmation ORDER PROCESSED BY BLOOD BANK   Glucose, capillary     Status: Abnormal   Collection Time: 10/29/15  7:42 AM  Result Value Ref Range   Glucose-Capillary 159 (H) 65 - 99 mg/dL   Comment  1 Notify RN    Comment 2 Document in Chart    Glucose, capillary     Status: Abnormal   Collection Time: 10/29/15 11:42 AM  Result Value Ref Range   Glucose-Capillary 128 (H) 65 - 99 mg/dL   Comment 1 Notify RN    Comment 2 Document in Chart   CBC     Status: Abnormal   Collection Time: 10/29/15  5:00 PM  Result Value Ref Range   WBC 16.9 (H) 4.0 - 10.5 K/uL   RBC 2.65 (L) 4.22 - 5.81 MIL/uL   Hemoglobin 8.0 (L) 13.0 - 17.0 g/dL   HCT 24.0 (L) 39.0 - 52.0 %   MCV 90.6 78.0 - 100.0 fL   MCH 30.2 26.0 - 34.0 pg   MCHC 33.3 30.0 - 36.0 g/dL   RDW 14.3 11.5 - 15.5 %   Platelets 253 150 - 400 K/uL  Glucose, capillary     Status: Abnormal   Collection Time: 10/29/15  5:14 PM  Result Value Ref Range   Glucose-Capillary 144 (H) 65 - 99 mg/dL   Comment 1 Notify RN    Comment 2 Document in Chart   APTT     Status: None   Collection Time: 10/29/15  6:51 PM  Result Value Ref Range   aPTT 29 24 - 36 seconds  Protime-INR     Status: Abnormal   Collection Time: 10/29/15  6:51 PM  Result Value Ref Range   Prothrombin Time 16.1 (H) 11.4 - 15.2 seconds   INR 1.28   Prepare RBC     Status: None   Collection Time: 10/29/15  6:53 PM  Result Value Ref Range   Order Confirmation ORDER PROCESSED BY BLOOD BANK     Dg Shoulder Left  Result Date: 10/28/2015 CLINICAL DATA:  Acute on chronic left shoulder pain without known injury. EXAM: LEFT SHOULDER - 2+ VIEW COMPARISON:  None. FINDINGS: There is no evidence of fracture or dislocation. There is no evidence of arthropathy or other focal bone abnormality. Soft tissues are unremarkable. IMPRESSION: Normal left shoulder. Electronically Signed   By: Marijo Conception, M.D.   On: 10/28/2015 15:34    Review of Systems  Constitutional: Negative for chills and fever.  Gastrointestinal: Positive for blood in stool and melena. Negative for abdominal pain.   Blood pressure 109/60, pulse (!) 107, temperature 98.6 F (37 C), temperature source Oral, resp. rate (!) 23, height '5\' 5"'  (1.651 m), weight 73.8  kg (162 lb 11.2 oz), SpO2 100 %. Physical Exam  Vitals reviewed. Constitutional: He appears well-developed and well-nourished.  GI: Soft. Bowel sounds are normal. There is no tenderness.  Healed low midline with llq stoma and melena    Assessment/Plan: UGI bleed  I dont think he needs any surgical intervention at this point.  Will continue to follow.  I think repeating egd as planned is good idea.  I think he also needs his ostomy takedown delayed and will let dr Ninfa Linden know  [facility] 10/29/2015, 7:40 PM

## 2015-10-29 NOTE — Progress Notes (Signed)
Pt had further hematemesis of about 400 mL of burgundy liquid bld and clots.  He remains hemodynamically stable. Current hgb 8.0.   Discussed egd w/ pt, and he is agreeable to proceeding.  Cleotis Nipper, M.D. Pager [phone] If no answer or after 5 PM call [phone]

## 2015-10-30 ENCOUNTER — Inpatient Hospital Stay (HOSPITAL_COMMUNITY): Payer: 59

## 2015-10-30 ENCOUNTER — Inpatient Hospital Stay (HOSPITAL_COMMUNITY): Payer: 59 | Admitting: Anesthesiology

## 2015-10-30 ENCOUNTER — Inpatient Hospital Stay (HOSPITAL_COMMUNITY): Payer: 59 | Admitting: Certified Registered"

## 2015-10-30 ENCOUNTER — Encounter (HOSPITAL_COMMUNITY)
Admission: EM | Disposition: A | Discharge status: Home Health | Payer: Self-pay | Source: Home / Self Care | Attending: Family Medicine

## 2015-10-30 ENCOUNTER — Encounter (HOSPITAL_COMMUNITY): Payer: Self-pay | Admitting: *Deleted

## 2015-10-30 HISTORY — PX: 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: SHX6339

## 2015-10-30 HISTORY — PX: ESOPHAGOGASTRODUODENOSCOPY: SHX5428

## 2015-10-30 HISTORY — PX: REPAIR OF PERFORATED ULCER: SHX6065

## 2015-10-30 LAB — COMPREHENSIVE METABOLIC PANEL
ALBUMIN: 1.7 g/dL — AB (ref 3.5–5.0)
ALK PHOS: 29 U/L — AB (ref 38–126)
ALT: 8 U/L — ABNORMAL LOW (ref 17–63)
ANION GAP: 3 — AB (ref 5–15)
AST: 10 U/L — ABNORMAL LOW (ref 15–41)
BUN: 31 mg/dL — ABNORMAL HIGH (ref 6–20)
CO2: 22 mmol/L (ref 22–32)
Calcium: 6.9 mg/dL — ABNORMAL LOW (ref 8.9–10.3)
Chloride: 112 mmol/L — ABNORMAL HIGH (ref 101–111)
Creatinine, Ser: 1.17 mg/dL (ref 0.61–1.24)
GFR calc Af Amer: >60 mL/min (ref >60)
GFR calc non Af Amer: >60 mL/min (ref >60)
GLUCOSE: 160 mg/dL — AB (ref 65–99)
POTASSIUM: 4.5 mmol/L (ref 3.5–5.1)
SODIUM: 137 mmol/L (ref 135–145)
Total Bilirubin: 0.6 mg/dL (ref 0.3–1.2)
Total Protein: 3.4 g/dL — ABNORMAL LOW (ref 6.5–8.1)

## 2015-10-30 LAB — PROTIME-INR
INR: 1.42
INR: 1.34
PROTHROMBIN TIME: 17.4 s — AB (ref 11.4–15.2)
Prothrombin Time: 16.7 seconds — ABNORMAL HIGH (ref 11.4–15.2)

## 2015-10-30 LAB — CBC WITH DIFFERENTIAL/PLATELET
BASOS ABS: 0 10*3/uL (ref 0.0–0.1)
Basophils Relative: 0 %
Eosinophils Absolute: 0 10*3/uL (ref 0.0–0.7)
Eosinophils Relative: 0 %
HEMATOCRIT: 27.4 % — AB (ref 39.0–52.0)
HEMOGLOBIN: 9 g/dL — AB (ref 13.0–17.0)
LYMPHS PCT: 7 %
Lymphs Abs: 0.6 10*3/uL — ABNORMAL LOW (ref 0.7–4.0)
MCH: 29.3 pg (ref 26.0–34.0)
MCHC: 32.8 g/dL (ref 30.0–36.0)
MCV: 89.3 fL (ref 78.0–100.0)
Monocytes Absolute: 0.5 10*3/uL (ref 0.1–1.0)
Monocytes Relative: 7 %
NEUTROS ABS: 6.6 10*3/uL (ref 1.7–7.7)
NEUTROS PCT: 86 %
PLATELETS: 131 10*3/uL — AB (ref 150–400)
RBC: 3.07 MIL/uL — AB (ref 4.22–5.81)
RDW: 16 % — ABNORMAL HIGH (ref 11.5–15.5)
WBC: 7.6 10*3/uL (ref 4.0–10.5)

## 2015-10-30 LAB — PREPARE RBC (CROSSMATCH)

## 2015-10-30 LAB — BASIC METABOLIC PANEL
ANION GAP: 4 — AB (ref 5–15)
BUN: 29 mg/dL — ABNORMAL HIGH (ref 6–20)
CHLORIDE: 112 mmol/L — AB (ref 101–111)
CO2: 21 mmol/L — AB (ref 22–32)
Calcium: 6.8 mg/dL — ABNORMAL LOW (ref 8.9–10.3)
Creatinine, Ser: 0.97 mg/dL (ref 0.61–1.24)
GFR calc Af Amer: >60 mL/min (ref >60)
GLUCOSE: 150 mg/dL — AB (ref 65–99)
POTASSIUM: 3.6 mmol/L (ref 3.5–5.1)
Sodium: 137 mmol/L (ref 135–145)

## 2015-10-30 LAB — GLUCOSE, CAPILLARY
GLUCOSE-CAPILLARY: 154 mg/dL — AB (ref 65–99)
GLUCOSE-CAPILLARY: 138 mg/dL — AB (ref 65–99)
Glucose-Capillary: 178 mg/dL — ABNORMAL HIGH (ref 65–99)
Glucose-Capillary: 151 mg/dL — ABNORMAL HIGH (ref 65–99)

## 2015-10-30 LAB — POCT I-STAT 7, (LYTES, BLD GAS, ICA,H+H)
Acid-base deficit: 1 mmol/L (ref 0.0–2.0)
Bicarbonate: 24.5 mEq/L — ABNORMAL HIGH (ref 20.0–24.0)
Calcium, Ion: 1.09 mmol/L — ABNORMAL LOW (ref 1.12–1.23)
HCT: 18 % — ABNORMAL LOW (ref 39.0–52.0)
HEMOGLOBIN: 6.1 g/dL — AB (ref 13.0–17.0)
O2 SAT: 100 %
PCO2 ART: 42.6 mmHg (ref 35.0–45.0)
PO2 ART: 347 mmHg — AB (ref 80.0–100.0)
Potassium: 4.8 mmol/L (ref 3.5–5.1)
Sodium: 140 mmol/L (ref 135–145)
TCO2: 26 mmol/L (ref 0–100)
pH, Arterial: 7.363 (ref 7.350–7.450)

## 2015-10-30 LAB — CBC
HCT: 21 % — ABNORMAL LOW (ref 39.0–52.0)
HCT: 22.6 % — ABNORMAL LOW (ref 39.0–52.0)
HCT: 27.7 % — ABNORMAL LOW (ref 39.0–52.0)
HEMOGLOBIN: 7.1 g/dL — AB (ref 13.0–17.0)
Hemoglobin: 7.6 g/dL — ABNORMAL LOW (ref 13.0–17.0)
Hemoglobin: 9.3 g/dL — ABNORMAL LOW (ref 13.0–17.0)
MCH: 29.6 pg (ref 26.0–34.0)
MCH: 29.5 pg (ref 26.0–34.0)
MCH: 29.9 pg (ref 26.0–34.0)
MCHC: 33.8 g/dL (ref 30.0–36.0)
MCHC: 33.6 g/dL (ref 30.0–36.0)
MCHC: 33.6 g/dL (ref 30.0–36.0)
MCV: 87.5 fL (ref 78.0–100.0)
MCV: 87.6 fL (ref 78.0–100.0)
MCV: 89.1 fL (ref 78.0–100.0)
PLATELETS: 189 10*3/uL (ref 150–400)
PLATELETS: 139 10*3/uL — AB (ref 150–400)
PLATELETS: 136 10*3/uL — AB (ref 150–400)
RBC: 2.4 MIL/uL — ABNORMAL LOW (ref 4.22–5.81)
RBC: 2.58 MIL/uL — AB (ref 4.22–5.81)
RBC: 3.11 MIL/uL — AB (ref 4.22–5.81)
RDW: 14.9 % (ref 11.5–15.5)
RDW: 16.1 % — AB (ref 11.5–15.5)
RDW: 16.3 % — ABNORMAL HIGH (ref 11.5–15.5)
WBC: 16.5 10*3/uL — ABNORMAL HIGH (ref 4.0–10.5)
WBC: 8.2 10*3/uL (ref 4.0–10.5)
WBC: 5.7 10*3/uL (ref 4.0–10.5)

## 2015-10-30 LAB — APTT
APTT: 27 s (ref 24–36)
aPTT: 25 seconds (ref 24–36)

## 2015-10-30 SURGERY — REPAIR, ULCER, PEPTIC, PERFORATED
Anesthesia: General | Site: Abdomen

## 2015-10-30 SURGERY — EGD (ESOPHAGOGASTRODUODENOSCOPY)
Anesthesia: Monitor Anesthesia Care

## 2015-10-30 MED ORDER — DEXAMETHASONE SODIUM PHOSPHATE 10 MG/ML IJ SOLN
INTRAMUSCULAR | Status: DC | PRN
  Administered 2015-10-30: 10 mg via INTRAVENOUS

## 2015-10-30 MED ORDER — FENTANYL CITRATE (PF) 100 MCG/2ML IJ SOLN
INTRAMUSCULAR | Status: DC | PRN
  Administered 2015-10-30: 100 ug via INTRAVENOUS
  Administered 2015-10-30 (×3): 50 ug via INTRAVENOUS

## 2015-10-30 MED ORDER — PHENYLEPHRINE HCL 10 MG/ML IJ SOLN
INTRAMUSCULAR | Status: DC | PRN
  Administered 2015-10-30 (×2): 80 ug via INTRAVENOUS
  Administered 2015-10-30 (×2): 120 ug via INTRAVENOUS
  Administered 2015-10-30: 80 ug via INTRAVENOUS

## 2015-10-30 MED ORDER — SODIUM CHLORIDE 0.9% FLUSH: 9.0000 mL | INTRAVENOUS | Status: DC | PRN

## 2015-10-30 MED ORDER — SODIUM CHLORIDE 0.9 % IV BOLUS (SEPSIS)
1000.0000 mL | Freq: Once | INTRAVENOUS | Status: AC
  Administered 2015-10-30: 1000 mL via INTRAVENOUS

## 2015-10-30 MED ORDER — HYDROMORPHONE HCL 1 MG/ML IJ SOLN
INTRAMUSCULAR | Status: AC
  Filled 2015-10-30: qty 1

## 2015-10-30 MED ORDER — LACTATED RINGERS IV SOLN
INTRAVENOUS | Status: DC | PRN
  Administered 2015-10-30: 12:00:00 via INTRAVENOUS

## 2015-10-30 MED ORDER — ONDANSETRON HCL 4 MG/2ML IJ SOLN
INTRAMUSCULAR | Status: AC
  Filled 2015-10-30: qty 2

## 2015-10-30 MED ORDER — PROPOFOL 500 MG/50ML IV EMUL
INTRAVENOUS | Status: DC | PRN
  Administered 2015-10-30: 100 ug/kg/min via INTRAVENOUS

## 2015-10-30 MED ORDER — 0.9 % SODIUM CHLORIDE (POUR BTL) OPTIME
TOPICAL | Status: DC | PRN
  Administered 2015-10-30: 2000 mL

## 2015-10-30 MED ORDER — HYDROMORPHONE HCL 1 MG/ML IJ SOLN: 0.2500 mg | INTRAMUSCULAR | Status: DC | PRN

## 2015-10-30 MED ORDER — MIDAZOLAM HCL 2 MG/2ML IJ SOLN
INTRAMUSCULAR | Status: AC
Start: 2015-10-30 — End: 2015-10-30
  Filled 2015-10-30: qty 2

## 2015-10-30 MED ORDER — BUTAMBEN-TETRACAINE-BENZOCAINE 2-2-14 % EX AERO
INHALATION_SPRAY | CUTANEOUS | Status: DC | PRN
  Administered 2015-10-30: 2 via TOPICAL

## 2015-10-30 MED ORDER — HYDROMORPHONE 1 MG/ML IV SOLN
INTRAVENOUS | Status: DC
  Administered 2015-10-30: 1.2 mg via INTRAVENOUS
  Administered 2015-10-30: 15:00:00 via INTRAVENOUS
  Administered 2015-10-31: 3 mg via INTRAVENOUS
  Administered 2015-10-31: 1.5 mg via INTRAVENOUS
  Administered 2015-10-31: 2.7 mg via INTRAVENOUS
  Administered 2015-10-31: 2.4 mg via INTRAVENOUS
  Administered 2015-10-31: 0.6 mg via INTRAVENOUS
  Administered 2015-10-31: 2.1 mg via INTRAVENOUS
  Administered 2015-11-01: 0.3 mg via INTRAVENOUS
  Administered 2015-11-01: 0.6 mg via INTRAVENOUS
  Administered 2015-11-01 (×2): 0.3 mg via INTRAVENOUS
  Administered 2015-11-01: 1.2 mg via INTRAVENOUS

## 2015-10-30 MED ORDER — PROPOFOL 10 MG/ML IV BOLUS
INTRAVENOUS | Status: DC | PRN
  Administered 2015-10-30: 30 mg via INTRAVENOUS
  Administered 2015-10-30: 200 mg via INTRAVENOUS

## 2015-10-30 MED ORDER — CEFAZOLIN SODIUM-DEXTROSE 2-3 GM-% IV SOLR
INTRAVENOUS | Status: DC | PRN
  Administered 2015-10-30: 2 g via INTRAVENOUS

## 2015-10-30 MED ORDER — PHENYLEPHRINE HCL 10 MG/ML IJ SOLN
INTRAMUSCULAR | Status: DC | PRN
Start: 2015-10-30 — End: 2015-10-30
  Administered 2015-10-30: 40 ug via INTRAVENOUS
  Administered 2015-10-30 (×2): 80 ug via INTRAVENOUS
  Administered 2015-10-30: 40 ug via INTRAVENOUS
  Administered 2015-10-30: 120 ug via INTRAVENOUS

## 2015-10-30 MED ORDER — PHENYLEPHRINE 40 MCG/ML (10ML) SYRINGE FOR IV PUSH (FOR BLOOD PRESSURE SUPPORT)
PREFILLED_SYRINGE | INTRAVENOUS | Status: AC
  Filled 2015-10-30: qty 10

## 2015-10-30 MED ORDER — PROPOFOL 10 MG/ML IV BOLUS
INTRAVENOUS | Status: AC
  Filled 2015-10-30: qty 20

## 2015-10-30 MED ORDER — DIPHENHYDRAMINE HCL 12.5 MG/5ML PO ELIX: 12.5000 mg | ORAL_SOLUTION | Freq: Four times a day (QID) | ORAL | Status: DC | PRN

## 2015-10-30 MED ORDER — ROCURONIUM BROMIDE 100 MG/10ML IV SOLN
INTRAVENOUS | Status: DC | PRN
  Administered 2015-10-30: 20 mg via INTRAVENOUS
  Administered 2015-10-30: 30 mg via INTRAVENOUS
  Administered 2015-10-30: 20 mg via INTRAVENOUS

## 2015-10-30 MED ORDER — DEXAMETHASONE SODIUM PHOSPHATE 10 MG/ML IJ SOLN
INTRAMUSCULAR | Status: AC
  Filled 2015-10-30: qty 1

## 2015-10-30 MED ORDER — HYDROMORPHONE 1 MG/ML IV SOLN
INTRAVENOUS | Status: AC
  Filled 2015-10-30: qty 25

## 2015-10-30 MED ORDER — LIDOCAINE HCL (CARDIAC) 20 MG/ML IV SOLN
INTRAVENOUS | Status: DC | PRN
  Administered 2015-10-30: 100 mg via INTRATRACHEAL

## 2015-10-30 MED ORDER — METOCLOPRAMIDE HCL 5 MG/ML IJ SOLN: 10.0000 mg | Freq: Four times a day (QID) | INTRAMUSCULAR | Status: DC

## 2015-10-30 MED ORDER — CEFAZOLIN IN D5W 1 GM/50ML IV SOLN
1.0000 g | Freq: Three times a day (TID) | INTRAVENOUS | Status: AC
Start: 2015-10-30 — End: 2015-10-31
  Administered 2015-10-30 – 2015-10-31 (×3): 1 g via INTRAVENOUS
  Filled 2015-10-30 (×4): qty 50

## 2015-10-30 MED ORDER — SUCCINYLCHOLINE CHLORIDE 200 MG/10ML IV SOSY
PREFILLED_SYRINGE | INTRAVENOUS | Status: AC
  Filled 2015-10-30: qty 10

## 2015-10-30 MED ORDER — LACTATED RINGERS IV SOLN
INTRAVENOUS | Status: DC
  Administered 2015-10-30: 1000 mL via INTRAVENOUS
  Administered 2015-10-30: 10:00:00 via INTRAVENOUS

## 2015-10-30 MED ORDER — PROCHLORPERAZINE EDISYLATE 5 MG/ML IJ SOLN: 10.0000 mg | Freq: Once | INTRAMUSCULAR | Status: DC

## 2015-10-30 MED ORDER — LABETALOL HCL 5 MG/ML IV SOLN: 5.0000 mg | INTRAVENOUS | Status: DC | PRN

## 2015-10-30 MED ORDER — PROPOFOL 10 MG/ML IV BOLUS
INTRAVENOUS | Status: DC | PRN
  Administered 2015-10-30: 70 mg via INTRAVENOUS

## 2015-10-30 MED ORDER — ALBUMIN HUMAN 5 % IV SOLN
INTRAVENOUS | Status: DC | PRN
  Administered 2015-10-30: 12:00:00 via INTRAVENOUS

## 2015-10-30 MED ORDER — FENTANYL CITRATE (PF) 250 MCG/5ML IJ SOLN
INTRAMUSCULAR | Status: AC
  Filled 2015-10-30: qty 5

## 2015-10-30 MED ORDER — NALOXONE HCL 0.4 MG/ML IJ SOLN: 0.4000 mg | INTRAMUSCULAR | Status: DC | PRN

## 2015-10-30 MED ORDER — PROMETHAZINE HCL 25 MG/ML IJ SOLN: 6.2500 mg | INTRAMUSCULAR | Status: DC | PRN

## 2015-10-30 MED ORDER — LABETALOL HCL 5 MG/ML IV SOLN
5.0000 mg | INTRAVENOUS | Status: DC | PRN
  Administered 2015-10-30: 5 mg via INTRAVENOUS

## 2015-10-30 MED ORDER — ROCURONIUM BROMIDE 50 MG/5ML IV SOLN
INTRAVENOUS | Status: AC
  Filled 2015-10-30: qty 1

## 2015-10-30 MED ORDER — PROCHLORPERAZINE EDISYLATE 5 MG/ML IJ SOLN
10.0000 mg | Freq: Once | INTRAMUSCULAR | Status: AC
  Administered 2015-10-30: 10 mg via INTRAVENOUS
  Filled 2015-10-30: qty 2

## 2015-10-30 MED ORDER — SODIUM CHLORIDE 0.9 % IV SOLN
Freq: Once | INTRAVENOUS | Status: AC
  Administered 2015-10-30: 05:00:00 via INTRAVENOUS

## 2015-10-30 MED ORDER — SODIUM CHLORIDE 0.9 % IV SOLN: INTRAVENOUS | Status: DC

## 2015-10-30 MED ORDER — SODIUM CHLORIDE 0.9 % IV SOLN: Freq: Once | INTRAVENOUS | Status: DC

## 2015-10-30 MED ORDER — DIPHENHYDRAMINE HCL 50 MG/ML IJ SOLN: 12.5000 mg | Freq: Four times a day (QID) | INTRAMUSCULAR | Status: DC | PRN

## 2015-10-30 MED ORDER — LIDOCAINE HCL (CARDIAC) 20 MG/ML IV SOLN
INTRAVENOUS | Status: DC | PRN
  Administered 2015-10-30: 50 mg via INTRAVENOUS
  Administered 2015-10-30: 80 mg via INTRAVENOUS

## 2015-10-30 MED ORDER — SODIUM CHLORIDE 0.9 % IV BOLUS (SEPSIS)
500.0000 mL | Freq: Once | INTRAVENOUS | Status: AC
  Administered 2015-10-30: 500 mL via INTRAVENOUS

## 2015-10-30 MED ORDER — SUGAMMADEX SODIUM 200 MG/2ML IV SOLN
INTRAVENOUS | Status: DC | PRN
  Administered 2015-10-30: 147.6 mg via INTRAVENOUS

## 2015-10-30 MED ORDER — ONDANSETRON HCL 4 MG/2ML IJ SOLN
INTRAMUSCULAR | Status: DC | PRN
  Administered 2015-10-30: 4 mg via INTRAVENOUS

## 2015-10-30 MED ORDER — SUCCINYLCHOLINE CHLORIDE 20 MG/ML IJ SOLN
INTRAMUSCULAR | Status: DC | PRN
  Administered 2015-10-30: 100 mg via INTRAVENOUS

## 2015-10-30 MED ORDER — LABETALOL HCL 5 MG/ML IV SOLN
INTRAVENOUS | Status: AC
  Filled 2015-10-30: qty 4

## 2015-10-30 MED ORDER — POVIDONE-IODINE 10 % EX OINT
TOPICAL_OINTMENT | CUTANEOUS | Status: AC
  Filled 2015-10-30: qty 28.35

## 2015-10-30 MED ORDER — METOCLOPRAMIDE HCL 5 MG/ML IJ SOLN
10.0000 mg | Freq: Four times a day (QID) | INTRAMUSCULAR | Status: DC
  Administered 2015-10-30: 10 mg via INTRAVENOUS
  Filled 2015-10-30: qty 2

## 2015-10-30 SURGICAL SUPPLY — 33 items
BRR ADH 5X3 SEPRAFILM 6 SHT (MISCELLANEOUS) ×2
CONT SPEC STER OR (MISCELLANEOUS) ×2 IMPLANT
DRAPE WARM FLUID 44X44 (DRAPE) ×2 IMPLANT
DRSG OPSITE POSTOP 4X10 (GAUZE/BANDAGES/DRESSINGS) ×2 IMPLANT
ELECT BLADE 6.5 EXT (BLADE) ×2 IMPLANT
GLOVE BIO SURGEON STRL SZ8 (GLOVE) ×2 IMPLANT
GLOVE BIOGEL PI IND STRL 8 (GLOVE) IMPLANT
GLOVE BIOGEL PI INDICATOR 8 (GLOVE) ×4
GLOVE ECLIPSE 7.5 STRL STRAW (GLOVE) ×2 IMPLANT
GOWN STRL REUS W/ TWL LRG LVL3 (GOWN DISPOSABLE) IMPLANT
GOWN STRL REUS W/ TWL XL LVL3 (GOWN DISPOSABLE) IMPLANT
GOWN STRL REUS W/TWL LRG LVL3 (GOWN DISPOSABLE) ×8
GOWN STRL REUS W/TWL XL LVL3 (GOWN DISPOSABLE) ×4
KIT BASIN OR (CUSTOM PROCEDURE TRAY) ×2 IMPLANT
KIT OSTOMY DRAINABLE 2.75 STR (WOUND CARE) ×2 IMPLANT
LIGASURE IMPACT 36 18CM CVD LR (INSTRUMENTS) ×2 IMPLANT
PACK GENERAL/GYN (CUSTOM PROCEDURE TRAY) ×2 IMPLANT
PEN SKIN MARKING BROAD (MISCELLANEOUS) ×2 IMPLANT
RELOAD AUTO 90-4.8 TA90 GRN (ENDOMECHANICALS) ×4 IMPLANT
RELOAD PROXIMATE 75MM BLUE (ENDOMECHANICALS) ×4 IMPLANT
RELOAD STAPLE 75 3.8 BLU REG (ENDOMECHANICALS) IMPLANT
RELOAD STAPLE 90 GRN THCK DST (ENDOMECHANICALS) IMPLANT
SEPRAFILM PROCEDURAL PACK 3X5 (MISCELLANEOUS) ×2 IMPLANT
SPECIMEN JAR LARGE (MISCELLANEOUS) ×2 IMPLANT
STAPLER GUN LINEAR PROX 60 (STAPLE) ×2 IMPLANT
STAPLER PROXIMATE 75MM BLUE (STAPLE) ×2 IMPLANT
SUCTION POOLE TIP (SUCTIONS) ×2 IMPLANT
SUT PDS AB 1 TP1 96 (SUTURE) ×4 IMPLANT
SUT SILK 2 0 SH CR/8 (SUTURE) ×2 IMPLANT
SUT SILK 2 0 TIES 17X18 (SUTURE) ×4
SUT SILK 2-0 18XBRD TIE BLK (SUTURE) IMPLANT
SUT SILK 3 0 SH CR/8 (SUTURE) ×2 IMPLANT
YANKAUER SUCT BULB TIP NO VENT (SUCTIONS) ×2 IMPLANT

## 2015-10-30 NOTE — Transfer of Care (Signed)
Immediate Anesthesia Transfer of Care Note  Patient: Miguel [name]  Procedure(s) Performed: Procedure(s): ESOPHAGOGASTRODUODENOSCOPY (EGD) (N/A)  Patient Location: Endoscopy Unit  Anesthesia Type:MAC  Level of Consciousness: awake and patient cooperative  Airway & Oxygen Therapy: Patient Spontanous Breathing and Patient connected to nasal cannula oxygen  Post-op Assessment: Report given to RN and Post -op Vital signs reviewed and stable  Post vital signs: Reviewed and stable  Last Vitals:  Vitals:   10/30/15 0823 10/30/15 0848  BP: 124/73 125/69  Pulse: 97 95  Resp: 16 14  Temp: 36.9 C 36.9 C    Last Pain:  Vitals:   10/30/15 0848  TempSrc: Oral  PainSc:          Complications: No apparent anesthesia complications

## 2015-10-30 NOTE — Progress Notes (Signed)
PROGRESS NOTE    Miguel [name]  [id number] DOB: [birth year] DOA: 10/27/2015 PCP: Donnie Coffin, MD   Brief Narrative:   65 y.o. WM PMHx GI bleed related to NSAID use, Gastritis, recent Sigmoid Diverticulitis with perforation S/P Hartmann's procedure, Nephrolithiasis,  Presents to ED hematemesis and syncope. Initial evaluation reveals acute blood loss anemia likely related to GI bleed in setting of NSAID use, .  Information is obtained from the chart and the patient and the wife who is at the bedside. Wife reports patient with 2 episodes of vomiting large amounts bright red blood. EMS was called and reportedly patient was pale diaphoretic but alert. He then had an episode of emesis bright red blood of 250cc. his blood pressure 60 palp with a heart rate of 1:30 he was immediately given 500 mL of normal saline. Patient reports several weeks he started feeling "not too good". He complains of intermittent nausea without emesis some shortness of breath with exertion and worsening chronic left shoulder pain. In addition he was having a feeling of constipation for which he took miralax. He denies abdominal pain but does endorse continuous NSAID use over the years. Of note in April of this he had perforated sigmoid diverticulitis with surgical repair hartmann procedure and colostomy. He is scheduled to have colostomy reversed August 10. He denies any fever chills cough abdominal pain dysuria hematuria frequency or urgency.   Subjective:   Assessment & Plan:   Principal Problem:   Acute blood loss anemia Active Problems:   Diverticulitis of colon with perforation   GI bleed   Syncope   Hyperglycemia   Hypotension   Neck pain   Hematemesis   Hematochezia   Left shoulder pain   Muscle spasm of left shoulder   Trapezius muscle spasm   Acute Blood loss anemia.  -7/31 transfuse 3 units PRBC -Monitor hemoglobin BID -8/3 transfuse 2 units PRBC -S/P EGD:. Gastric ulcer see results  below  GI bleed/Hematemesis/ Hematochezia -Prior bleed related to NSAID overuse  -Hold NSAIDs -Clear liquid diet will advance as tolerated -Protonix 40 mg BID  - Of note patient underwent Hartmann procedure in April of this year scheduled for colostomy reversal August 10 of this year -8/3 overnight Pt began to bleed again taken to surgery . Hypotension. - Likely related to volume loss secondary  -Hold home BP medication   Hyperglycemia.  -7/31 Hemoglobin A1c= 5.9 -Monitor CBGs -Use sensitive SSI  Syncope.  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 Clarene Essex [facility]   Procedures/Significant Events:  7/31 EGD: Large hiatal hernia-nonbleeding gastric ulcer with adherent clot-clotted blood in the cardia, gastric fundus, greater curvature of the stomach, lesser curvature of the stomach 7/31 transfuse 3 units PRBC 8/3 transfuse 2 units PRBC    Cultures   Antimicrobials:    Devices    LINES / TUBES:      Continuous Infusions: . sodium chloride 125 mL/hr at 10/30/15 0500  . sodium chloride    . pantoprozole (PROTONIX) infusion 8 mg/hr (10/30/15 0500)     Objective: Vitals:   10/30/15 0625 10/30/15 0645 10/30/15 0700 10/30/15 0823  BP:   107/64 124/73  Pulse:   100 97  Resp:   13 16  Temp: 98.3 F (36.8 C) 98.2 F (36.8 C) 99.5 F (37.5 C) 98.5 F (36.9 C)  TempSrc: Oral Oral Axillary Oral  SpO2:   95% 98%  Weight:      Height:        Intake/Output Summary (Last 24 hours) at 10/30/15 0844 Last data filed at 10/30/15 0823  Gross per 24 hour  Intake          4192.92 ml  Output             1075 ml  Net           3117.92 ml   Filed Weights   10/27/15 1719 10/28/15 0024  Weight: 72.4 kg (159 lb 9.8 oz) 73.8 kg (162 lb 11.2 oz)    Examination:  -8/3 overnight Pt began to bleed again taken to surgery. Currently still in surgery as of this note. NO Charge .     Data Reviewed: Care during the described time interval was provided by me .  I have reviewed this patient's available data, including medical history, events of note, physical examination, and all test results as part of my evaluation. I have personally reviewed and interpreted all radiology studies.  CBC:  Recent Labs Lab 10/27/15 1350  10/27/15 2026 10/28/15 0431 10/28/15 2046 10/29/15 0608 10/29/15 1700 10/30/15 0329  WBC 11.2*  --  11.2* 11.2*  --   --  16.9* 16.5*  NEUTROABS 8.8*  --   --   --   --   --   --   --   HGB 5.5*  < > 7.0* 7.6* 7.5* 6.5* 8.0* 7.1*  HCT 16.5*  < > 20.5* 22.0* 22.8* 19.8* 24.0* 21.0*  MCV 91.7  --  88.7 90.2  --   --  90.6 87.5  PLT 320  --  262 256  --   --  253 189  < > = values in this interval not displayed. Basic Metabolic Panel:  Recent Labs Lab 10/27/15 1401 10/28/15 0431 10/30/15 0329  NA 135 135 137  K 4.3 3.9 4.5  CL 100* 106 112*  CO2  --  25 22  GLUCOSE 232* 105* 160*  BUN 19 15 31*  CREATININE 1.10 0.92 1.17  CALCIUM  --  7.5* 6.9*   GFR: Estimated Creatinine Clearance: 59.1 mL/min (by C-G formula based on SCr of 1.17 mg/dL). Liver Function Tests:  Recent Labs Lab 10/30/15 0329  AST 10*  ALT 8*  ALKPHOS 29*  BILITOT 0.6  PROT 3.4*  ALBUMIN 1.7*   No results for input(s): LIPASE, AMYLASE in the last 168 hours. No results for input(s): AMMONIA in the last 168 hours. Coagulation Profile:  Recent Labs Lab 10/29/15 1851  INR 1.28   Cardiac Enzymes: No results for input(s): CKTOTAL, CKMB, CKMBINDEX, TROPONINI in the last 168 hours. BNP (last 3 results) No results for input(s): PROBNP in the last 8760 hours. HbA1C:  Recent Labs  10/27/15 1350  HGBA1C 5.9*    CBG:  Recent Labs Lab 10/29/15 1142 10/29/15 1714 10/29/15 2118 10/30/15 0339 10/30/15 0755  GLUCAP 128* 144* 109* 178* 151*   Lipid Profile: No results for input(s): CHOL, HDL, LDLCALC, TRIG, CHOLHDL, LDLDIRECT in the last 72 hours. Thyroid Function Tests: No results for input(s): TSH, T4TOTAL, FREET4, T3FREE, THYROIDAB in the last 72 hours. Anemia Panel: No results for input(s): VITAMINB12, FOLATE, FERRITIN, TIBC, IRON, RETICCTPCT in the last 72 hours. Urine analysis:    Component  Value Date/Time   COLORURINE YELLOW 10/11/2015 1459   APPEARANCEUR CLEAR 10/11/2015 1459   LABSPEC 1.011 10/11/2015 1459   PHURINE 6.0 10/11/2015 1459   GLUCOSEU NEGATIVE 10/11/2015 1459   HGBUR NEGATIVE 10/11/2015 1459   BILIRUBINUR NEGATIVE 10/11/2015 1459   KETONESUR NEGATIVE 10/11/2015 1459   PROTEINUR NEGATIVE 10/11/2015 1459   UROBILINOGEN 0.2 11/03/2013 0629   NITRITE NEGATIVE 10/11/2015 1459   LEUKOCYTESUR NEGATIVE 10/11/2015 1459   Sepsis Labs: @LABRCNTIP (procalcitonin:4,lacticidven:4)  ) Recent Results (from the past 240 hour(s))  MRSA PCR Screening     Status: None   Collection Time: 10/27/15  5:28 PM  Result Value Ref Range Status   MRSA by PCR NEGATIVE NEGATIVE Final    Comment:        The GeneXpert MRSA Assay (FDA approved for NASAL specimens only), is one component of a comprehensive MRSA colonization surveillance program. It is not intended to diagnose MRSA infection nor to guide or monitor treatment for MRSA infections.          Radiology Studies: Dg Shoulder Left  Result Date: 10/28/2015 CLINICAL DATA:  Acute on chronic left shoulder pain without known injury. EXAM: LEFT SHOULDER - 2+ VIEW COMPARISON:  None. FINDINGS: There is no evidence of fracture or dislocation. There is no evidence of arthropathy or other focal bone abnormality. Soft tissues are unremarkable. IMPRESSION: Normal left shoulder. Electronically Signed   By: Marijo Conception, M.D.   On:  10/28/2015 15:34        Scheduled Meds: . feeding supplement  1 Container Oral TID BM  . insulin aspart  0-5 Units Subcutaneous QHS  . insulin aspart  0-9 Units Subcutaneous TID WC  . metoCLOPramide (REGLAN) injection  10 mg Intravenous Q6H  . sodium chloride flush  3 mL Intravenous Q12H   Continuous Infusions: . sodium chloride 125 mL/hr at 10/30/15 0500  . sodium chloride    . pantoprozole (PROTONIX) infusion 8 mg/hr (10/30/15 0500)     LOS: 3 days    Time spent: 40 minutes    Alajah Witman, Geraldo Docker, MD [facility] Pager [phone]   If 7PM-7AM, please contact night-coverage [email or web address] Password [facility] 10/30/2015, 8:44 AM

## 2015-10-30 NOTE — Op Note (Signed)
OPERATIVE REPORT  DATE OF OPERATION:  10/30/2015  PATIENT:  Miguel [name]  65 y.o. 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  Initially the ulcer and distal stomach was stuck in the hiatal hernia  PROCEDURE:  Procedure(s): REPAIR OF BLEEDING  ULCER PARTIAL DISTAL GASTRECTOMY WITH BILROTH II RECONSTRUCTION/GASTROJEJUNOSTOMY  SURGEON:  Surgeon(s): Judeth Horn, MD  ASSISTANT: Grandville Silos  ANESTHESIA:   general  COMPLICATIONS:  None  EBL: 150 ml  BLOOD ADMINISTERED: 500 CC PRBC and 2 units FFP  DRAINS: Nasogastric Tube and Urinary Catheter (Foley)   SPECIMEN:  Source of Specimen:  Dsital stomach.  Frozen section sent from the ulcer bed Negative for tumor  COUNTS CORRECT:  YES  PROCEDURE DETAILS: The patient was taken to the operating room and placed on the table in the supine position. After an adequate general endotracheal anesthetic was administered, he was prepped and draped in usual sterile manner keeping his previously placed colostomy out of the field exposing most of his abdomen.  A proper timeout was performed identifying the patient and the procedure to be performed. An upper midline incision was made using a #10 blade and taken down to the midline fascia. We opened the fascia into the peritoneal cavity for the full extent of the incision. We mobilized the stomach which had a curious course going from a markedly dilated cardia and body of the stomach torquing back up towards a large hiatal hernia which contained the distal stomach inside of it including the antrum and a first portion of the duodenum.  Once this portion of the stomach had been mobilized into the wound 2 stay stitches are placed on the anterior wall of the stomach. There was a large  hard mass of near the lesser curvature the stomach with puckering of the external surface. Once his stay sutures were in place we made a gastrotomy with electrocautery, large enough in order to expose the large ulcer that was on the lesser curvature the stomach. This ulcer measured 8.5 by 5 cm in size and did not have any active bleeding but there was clot in the area previously described.  Because of the large size of this gastric ulcer biopsies are taken from the center of the bed which not surprisingly showed only scar tissue and inflammation, but no evidence of tumor. However because of the risk of tumor and also the large size of this ulcer without bleeding we thought that a distal gastrectomy with Billroth II reconstruction with the be the most prudent a procedure.  We mobilized the distal stomach up to the pylorus and came across the distal stomach just proximal to the pylorus using a GIA-75 blue cartridge stapler. We came across the lesser sac and (make sure there was no evidence of attachment posteriorly and there was none noted. On the mesentery of the lesser curvature we took down the blood supply not including the left gastric vessel using electrocautery, a LigaSure device, and Kelly clamps with ties. This allowed [place] to get out beyond the proximal gastrotomy where the nasogastric tube was pulled back we subsequently came across the mid- portion of the stomach using a TA-90 stapler. This was with a green cartridge.  We placed a large Kocher clamp on the distal stomach and resected the stomach. A subsequent  anastomosis was made to the jejunum approximately 25 cm distal to the ligament of Treitz. This was done using a GIA-75 stapler with the resulting expected enterotomy being closed with a TX 60 stapler.  Once the anastomosis was completed the NG tube was pulled back to the proper position just proximal to the anastomosis. The surgeon and the assistant changed gloves and then subsequently irrigated  with saline solution. We then closed the abdomen using running looped #1 PDS suture. We did not make any attempt to tethered the stomach as we felt as though with the gastrectomy the risk of reherniation into the hiatal hernia was little we did not close the hernia defect. All counts were correct. We closed the abdomen using the looped #1 PDS then the skin was closed using stainless steel staples.  A sterile dressing was applied. All needle counts, sponge counts, and instrument counts were correct.    PATIENT DISPOSITION:  PACU - guarded condition.   Miguel [name] 8/3/20171:29 PM

## 2015-10-30 NOTE — Anesthesia Procedure Notes (Signed)
Procedure Name: Intubation Date/Time: 10/30/2015 11:34 AM Performed by: Tressia Miners LEFFEW Pre-anesthesia Checklist: Patient identified, Patient being monitored, Timeout performed, Emergency Drugs available and Suction available Patient Re-evaluated:Patient Re-evaluated prior to inductionOxygen Delivery Method: Circle System Utilized Preoxygenation: Pre-oxygenation with 100% oxygen Intubation Type: IV induction Ventilation: Mask ventilation without difficulty Laryngoscope Size: 3 and Miller Grade View: Grade I Tube type: Oral Tube size: 7.0 mm Number of attempts: 1 Airway Equipment and Method: Stylet Placement Confirmation: ETT inserted through vocal cords under direct vision,  positive ETCO2 and breath sounds checked- equal and bilateral Secured at: 22 cm Tube secured with: Tape Dental Injury: Teeth and Oropharynx as per pre-operative assessment

## 2015-10-30 NOTE — Progress Notes (Signed)
Pt vomiting frank blood with clots, tarry stool coming out of colostomy.  30 minutes later pt sat up and became pale, confused, sweating.  [facility] notified of pts condition.  Pt had similar episode before per note when he vagaled down.  Will continue to monitor and update team

## 2015-10-30 NOTE — Progress Notes (Signed)
Dr. Donne Hazel notified of pts 7.1 hgb and 95/49 BP as well as pt overall status.  No new orders received. Plans to move forward with EGD as scheduled, but will consult Dr. Cristina Gong in AM to decide.  Will continue to monitor and update

## 2015-10-30 NOTE — Op Note (Signed)
[facility] Patient Name: Miguel [name] Procedure Date : 10/30/2015 MRN: [id number] Attending MD: Clarene Essex , MD Date of Birth: Sep 13, 1950 CSN: [id number] Age: 65 Admit Type: Inpatient Procedure:                Upper GI endoscopy Indications:              Hematemesis Providers:                Clarene Essex, MD, Sarah Monday RN, RN, William Dalton, Technician Referring MD:              Medicines:                Propofol total dose 900 mg IV100 mg of IV lidocaine                            Neo-Synephrine as well Complications:            No immediate complications. Estimated Blood Loss:     Estimated blood loss: none. Procedure:                Pre-Anesthesia Assessment:                           - Prior to the procedure, a History and Physical                            was performed, and patient medications and                            allergies were reviewed. The patient's tolerance of                            previous anesthesia was also reviewed. The risks                            and benefits of the procedure and the sedation                            options and risks were discussed with the patient.                            All questions were answered, and informed consent                            was obtained. Prior Anticoagulants: The patient has                            taken aspirin, last dose was 3 days prior to                            procedure. ASA Grade Assessment: II - A patient  with mild systemic disease. After reviewing the                            risks and benefits, the patient was deemed in                            satisfactory condition to undergo the procedure.                           After obtaining informed consent, the endoscope was                            passed under direct vision. Throughout the                            procedure, the patient's blood pressure,  pulse, and                            oxygen saturations were monitored continuously. The                            was introduced through the mouth, with the                            intention of advancing to the stomach. The scope                            was advanced to the gastric cardia before the                            procedure was aborted. Medications were given. The                            upper GI endoscopy was technically difficult and                            complex due to a J-shaped stomach which made                            pyloric intubation difficult and excessive                            bleeding. Successful completion of the procedure                            was aided by increasing the dose of sedation                            medication and rolling him on his back and raising                            the head of his bed. The patient tolerated the  procedure fairly well. Findings:      A large hiatal hernia was present.      Clotted blood was found in the cardia, in the gastric fundus and in the       gastric body. unable to advance into the distal stomach due to J-shaped       stomach and increased blood and clots and we did try the extra suction       device without much success Impression:               - Large hiatal hernia.                           - Clotted blood in the cardia, in the gastric                            fundus and in the gastric body.                           - No specimens collected. Moderate Sedation:      moderate sedation-none Recommendation:           - Surgical consultation for consideration of                            procedure to oversew the ulcer today and might need                            a loose wrap to fix his hiatal hernia as well in                            case the twisting of it was playing a role with his                            ulceration and bleeding.                            - Make the patient NPO starting today.                           - Continue present medications. Procedure Code(s):        --- Professional ---                           [phone], 52, Esophagogastroduodenoscopy, flexible,                            transoral; diagnostic, including collection of                            specimen(s) by brushing or washing, when performed                            (separate procedure) Diagnosis Code(s):        --- Professional ---  K44.9, Diaphragmatic hernia without obstruction or                            gangrene                           K92.2, Gastrointestinal hemorrhage, unspecified                           K92.0, Hematemesis CPT copyright 2016 [employer] Association. All rights reserved. The codes documented in this report are preliminary and upon coder review may  be revised to meet current compliance requirements. Clarene Essex, MD 10/30/2015 10:31:15 AM This report has been signed electronically. Number of Addenda: 0

## 2015-10-30 NOTE — Progress Notes (Signed)
Miguel [name] 8:49 AM  Subjective: Patient with more bleeding last night after his endoscopy and his case discussed with my partner Dr. Jacinto Reap and surgery was consulted but they elected to wait on one more endoscopy prior to proceeding and his case was discussed with his wife as well he has only had a little black stool in his bag Objective: Vital signs stable afebrile exam please see preassessment evaluation labs reviewed  Assessment: Gastric ulcer with bleed  Plan: Okay to proceed with repeat endoscopy with anesthesia assistance  [facility]  Pager [phone] After 5PM or if no answer call [phone]

## 2015-10-30 NOTE — Anesthesia Preprocedure Evaluation (Signed)
Anesthesia Evaluation  Patient identified by MRN, date of birth, ID band Patient awake    Reviewed: Allergy & Precautions, Patient's Chart, lab work & pertinent test results  Airway Mallampati: II  TM Distance: >3 FB Neck ROM: Full    Dental   Pulmonary neg pulmonary ROS,    breath sounds clear to auscultation       Cardiovascular hypertension,  Rhythm:Regular Rate:Normal     Neuro/Psych Anxiety  Neuromuscular disease    GI/Hepatic Neg liver ROS, GI history noted. CE   Endo/Other    Renal/GU Renal disease     Musculoskeletal   Abdominal   Peds  Hematology  (+) anemia ,   Anesthesia Other Findings Coming urgently from Endoscopy following EGD, may have aspirated partially, has gastric full of blood is report I recieved  Reproductive/Obstetrics                             Anesthesia Physical  Anesthesia Plan  ASA: III and emergent  Anesthesia Plan: General   Post-op Pain Management:    Induction: Intravenous, Rapid sequence and Cricoid pressure planned  Airway Management Planned: Oral ETT  Additional Equipment:   Intra-op Plan:   Post-operative Plan: Possible Post-op intubation/ventilation  Informed Consent: I have reviewed the patients History and Physical, chart, labs and discussed the procedure including the risks, benefits and alternatives for the proposed anesthesia with the patient or authorized representative who has indicated his/her understanding and acceptance.   Dental advisory given  Plan Discussed with: CRNA and Anesthesiologist  Anesthesia Plan Comments:         Anesthesia Quick Evaluation

## 2015-10-30 NOTE — Anesthesia Postprocedure Evaluation (Signed)
Anesthesia Post Note  Patient: Miguel [name]  Procedure(s) Performed: Procedure(s) (LRB): REPAIR OF BLEEDING  ULCER (N/A) REMOVAL OF DISTAL STOMACH  Patient location during evaluation: PACU Anesthesia Type: General Level of consciousness: awake and alert Pain management: pain level controlled Vital Signs Assessment: post-procedure vital signs reviewed and stable Respiratory status: spontaneous breathing, nonlabored ventilation, respiratory function stable and patient connected to nasal cannula oxygen Cardiovascular status: blood pressure returned to baseline and stable Postop Assessment: no signs of nausea or vomiting Anesthetic complications: no    Last Vitals:  Vitals:   10/30/15 1055 10/30/15 1105  BP: 120/66 112/66  Pulse: 93 90  Resp: 15 (!) 24  Temp:      Last Pain:  Vitals:   10/30/15 0848  TempSrc: Oral  PainSc:                  Miguel [name]

## 2015-10-30 NOTE — Transfer of Care (Addendum)
Immediate Anesthesia Transfer of Care Note  Patient: Miguel [name]  Procedure(s) Performed: Procedure(s): REPAIR OF BLEEDING  ULCER (N/A) REMOVAL OF DISTAL STOMACH  Patient Location: PACU  Anesthesia Type:General  Level of Consciousness: awake, alert , oriented, patient cooperative and responds to stimulation  Airway & Oxygen Therapy: Patient Spontanous Breathing and Patient connected to face mask oxygen  Post-op Assessment: Report given to RN, Post -op Vital signs reviewed and stable and Patient moving all extremities X 4  Post vital signs: Reviewed and stable  Last Vitals:  Vitals:   10/30/15 1055 10/30/15 1105  BP: 120/66 112/66  Pulse: 93 90  Resp: 15 (!) 24  Temp:      Last Pain:  Vitals:   10/30/15 0848  TempSrc: Oral  PainSc:          Complications: No apparent anesthesia complications

## 2015-10-30 NOTE — Progress Notes (Addendum)
Patient rebled last night.  Re-endoscoped this AM, not able to stop the bleeding.  Has gotten nine units of blood since admission.  To OR for oversew of the gastric ulcer.  Kathryne Eriksson. Dahlia Bailiff, MD, [place] [phone] [facility]

## 2015-10-30 NOTE — Anesthesia Procedure Notes (Signed)
Procedure Name: MAC Date/Time: 10/30/2015 9:45 AM Performed by: Lance Coon Pre-anesthesia Checklist: Patient identified, Emergency Drugs available, Suction available, Patient being monitored and Timeout performed Patient Re-evaluated:Patient Re-evaluated prior to inductionOxygen Delivery Method: Nasal cannula Intubation Type: IV induction

## 2015-10-31 ENCOUNTER — Encounter (HOSPITAL_COMMUNITY): Payer: Self-pay | Admitting: General Surgery

## 2015-10-31 ENCOUNTER — Encounter: Payer: Medicare Other | Admitting: Physical Therapy

## 2015-10-31 LAB — CBC WITH DIFFERENTIAL/PLATELET
BASOS PCT: 0 %
Basophils Absolute: 0 10*3/uL (ref 0.0–0.1)
Eosinophils Absolute: 0 10*3/uL (ref 0.0–0.7)
Eosinophils Relative: 0 %
HEMATOCRIT: 24.7 % — AB (ref 39.0–52.0)
HEMOGLOBIN: 8.4 g/dL — AB (ref 13.0–17.0)
Lymphocytes Relative: 10 %
Lymphs Abs: 0.8 10*3/uL (ref 0.7–4.0)
MCH: 30.5 pg (ref 26.0–34.0)
MCHC: 34 g/dL (ref 30.0–36.0)
MCV: 89.8 fL (ref 78.0–100.0)
MONOS PCT: 4 %
Monocytes Absolute: 0.3 10*3/uL (ref 0.1–1.0)
NEUTROS ABS: 6.3 10*3/uL (ref 1.7–7.7)
NEUTROS PCT: 86 %
Platelets: 146 10*3/uL — ABNORMAL LOW (ref 150–400)
RBC: 2.75 MIL/uL — ABNORMAL LOW (ref 4.22–5.81)
RDW: 16.3 % — ABNORMAL HIGH (ref 11.5–15.5)
WBC: 7.4 10*3/uL (ref 4.0–10.5)

## 2015-10-31 LAB — TYPE AND SCREEN
ABO/RH(D): A POS
ANTIBODY SCREEN: NEGATIVE
UNIT DIVISION: 0
UNIT DIVISION: 0
UNIT DIVISION: 0
UNIT DIVISION: 0
UNIT DIVISION: 0
UNIT DIVISION: 0
UNIT DIVISION: 0
Unit division: 0
Unit division: 0
Unit division: 0
Unit division: 0
Unit division: 0
Unit division: 0
Unit division: 0
Unit division: 0

## 2015-10-31 LAB — BASIC METABOLIC PANEL
ANION GAP: 3 — AB (ref 5–15)
BUN: 20 mg/dL (ref 6–20)
CHLORIDE: 114 mmol/L — AB (ref 101–111)
CO2: 23 mmol/L (ref 22–32)
CREATININE: 0.96 mg/dL (ref 0.61–1.24)
Calcium: 6.9 mg/dL — ABNORMAL LOW (ref 8.9–10.3)
GFR calc non Af Amer: >60 mL/min (ref >60)
Glucose, Bld: 128 mg/dL — ABNORMAL HIGH (ref 65–99)
Potassium: 3.7 mmol/L (ref 3.5–5.1)
Sodium: 140 mmol/L (ref 135–145)

## 2015-10-31 LAB — GLUCOSE, CAPILLARY
GLUCOSE-CAPILLARY: 118 mg/dL — AB (ref 65–99)
GLUCOSE-CAPILLARY: 113 mg/dL — AB (ref 65–99)
Glucose-Capillary: 117 mg/dL — ABNORMAL HIGH (ref 65–99)
Glucose-Capillary: 105 mg/dL — ABNORMAL HIGH (ref 65–99)

## 2015-10-31 LAB — PREPARE FRESH FROZEN PLASMA
UNIT DIVISION: 0
Unit division: 0

## 2015-10-31 MED ORDER — FAMOTIDINE IN NACL 20-0.9 MG/50ML-% IV SOLN
20.0000 mg | Freq: Two times a day (BID) | INTRAVENOUS | Status: DC
  Administered 2015-10-31 – 2015-11-03 (×6): 20 mg via INTRAVENOUS
  Filled 2015-10-31 (×8): qty 50

## 2015-10-31 NOTE — Anesthesia Preprocedure Evaluation (Addendum)
Anesthesia Evaluation  Patient identified by MRN, date of birth, ID band Patient awake    Reviewed: Allergy & Precautions, NPO status   Airway Mallampati: II  TM Distance: >3 FB Neck ROM: Full    Dental  (+) Edentulous Upper   Pulmonary    breath sounds clear to auscultation       Cardiovascular hypertension,  Rhythm:Regular Rate:Normal     Neuro/Psych    GI/Hepatic   Endo/Other    Renal/GU Renal disease     Musculoskeletal   Abdominal   Peds  Hematology  (+) anemia ,   Anesthesia Other Findings   Reproductive/Obstetrics                            Anesthesia Physical Anesthesia Plan  ASA: II  Anesthesia Plan: MAC   Post-op Pain Management:    Induction: Intravenous  Airway Management Planned: Natural Airway  Additional Equipment:   Intra-op Plan:   Post-operative Plan:   Informed Consent: I have reviewed the patients History and Physical, chart, labs and discussed the procedure including the risks, benefits and alternatives for the proposed anesthesia with the patient or authorized representative who has indicated his/her understanding and acceptance.   Dental advisory given  Plan Discussed with:   Anesthesia Plan Comments:         Anesthesia Quick Evaluation

## 2015-10-31 NOTE — Progress Notes (Signed)
[place] TEAM 1 - Stepdown/ICU TEAM  MAUREEN HAAGEN  [id number] DOB: 11/05/1950 DOA: 10/27/2015 PCP: Donnie Coffin, MD    Brief Narrative:  65 y.o.M Hx GI bleed related to NSAID use, Gastritis, recent Sigmoid Diverticulitis with perforation S/P Hartmann's procedure, and Nephrolithiasis who presented to the ED c/o hematemesis and syncope.  Wife reported 2 episodes of vomiting large amounts bright red blood. EMS was called and found patient pale and diaphoretic but alert. He then had another episode of emesis bright red blood of 250cc. He denied abdominal pain but did endorse continuous NSAID use over the years. April 2017 he had a perforated sigmoid diverticulitis with surgical repair hartmann procedure and colostomy. He is scheduled to have colostomy reversed August 10.   Subjective: The patient is doing well.  He is focused on his left shoulder pain and worries it is an indication that things are going poorly with his stomach.  I have assured him that medically he appears stable at this time.  He denies chest pain shortness of breath nausea vomiting or abdominal pain.  He reports he has a good appetite and is anxious to be allowed to eat.  Assessment & Plan:  Very large bleeding ulcer / Hiatal hernia / Hematemesis -Prior bleed related to NSAID overuse  -EGD 7/31 noted large HH and gastric ulcer with adherent clot - repeat EGDs with recurrent severe bleeding unable to address bleeding -Taken to the OR 8/3 for repair of bleeding ulcer with partial distal gastrectomy with Billroth II reconstruction and gastrojejunostomy -General Surgery following and directing this portion of his care - clinically stabilizing nicely  Acute Blood loss anemia transfuse as required to keep hemoglobin at 7.0 or greater - hemoglobin appears to be stabilizing postop - continue to follow  Hypotension -due to signif acute blood loss - blood pressure has stabilized status post volume resuscitation and correction  of bleeding source  Hyperglycemia  -A1c 5.9 - CBG not significantly elevated  Chronic pain syndrome / left shoulder and C-spine arthritis -Pain reasonably controlled at present - it has been explained to the patient and a very clear language that he should not take NSAIDs  DVT prophylaxis: SCDs Code Status: FULL CODE Family Communication: Spoke with wife in room  Disposition Plan: Transfer to surgical floor - follow hemoglobin - mobilize - diet per general surgery  Consultants:  [facility]   Procedures: 7/31 EGD 8/2 EGD 8/3 EGD 8/3 partial distal gastrectomy with Billroth II reconstruction and gastrojejunostomy  Antimicrobials:  none  Objective: Blood pressure 132/66, pulse (!) 115, temperature 99.1 F (37.3 C), temperature source Oral, resp. rate 17, height 5\' 5"  (1.651 m), weight 77.6 kg (171 lb), SpO2 94 %.  Intake/Output Summary (Last 24 hours) at 10/31/15 1529 Last data filed at 10/31/15 1400  Gross per 24 hour  Intake             4210 ml  Output             2075 ml  Net             2135 ml   Filed Weights   10/27/15 1719 10/28/15 0024 10/31/15 0500  Weight: 72.4 kg (159 lb 9.8 oz) 73.8 kg (162 lb 11.2 oz) 77.6 kg (171 lb)    Examination: General: No acute respiratory distress Lungs: Clear to auscultation bilaterally without wheeze Cardiovascular: Regular rate and rhythm without murmur gallop or rub normal S1 and S2 Abdomen: Nontender, nondistended, soft, bowel sounds positive -  ostomy intact - midline upper abdom wound dressed and dry  Extremities: No significant cyanosis, clubbing, edema bilateral lower extremities  CBC:  Recent Labs Lab 10/27/15 1350  10/30/15 0329 10/30/15 1147 10/30/15 1245 10/30/15 1525 10/30/15 1636 10/31/15 0450  WBC 11.2*  < > 16.5*  --  8.2 5.7 7.6 7.4  NEUTROABS 8.8*  --   --   --   --   --  6.6 6.3  HGB 5.5*  < > 7.1* 6.1* 7.6* 9.3* 9.0* 8.4*  HCT 16.5*  < > 21.0* 18.0* 22.6* 27.7* 27.4* 24.7*  MCV 91.7  <  > 87.5  --  87.6 89.1 89.3 89.8  PLT 320  < > 189  --  139* 136* 131* 146*  < > = values in this interval not displayed.   Basic Metabolic Panel:  Recent Labs Lab 10/27/15 1401 10/28/15 0431 10/30/15 0329 10/30/15 1147 10/30/15 1636 10/31/15 0450  NA 135 135 137 140 137 140  K 4.3 3.9 4.5 4.8 3.6 3.7  CL 100* 106 112*  --  112* 114*  CO2  --  25 22  --  21* 23  GLUCOSE 232* 105* 160*  --  150* 128*  BUN 19 15 31*  --  29* 20  CREATININE 1.10 0.92 1.17  --  0.97 0.96  CALCIUM  --  7.5* 6.9*  --  6.8* 6.9*   GFR: Estimated Creatinine Clearance: 73.7 mL/min (by C-G formula based on SCr of 0.96 mg/dL).  Liver Function Tests:  Recent Labs Lab 10/30/15 0329  AST 10*  ALT 8*  ALKPHOS 29*  BILITOT 0.6  PROT 3.4*  ALBUMIN 1.7*    HbA1C: Hgb A1c MFr Bld  Date/Time Value Ref Range Status  10/27/2015 01:50 PM 5.9 (H) 4.8 - 5.6 % Final    Comment:    (NOTE)         Pre-diabetes: 5.7 - 6.4         Diabetes: >6.4         Glycemic control for adults with diabetes: <7.0     CBG:  Recent Labs Lab 10/30/15 0755 10/30/15 1555 10/30/15 2120 10/31/15 0800 10/31/15 1135  GLUCAP 151* 154* 138* 117* 118*    Recent Results (from the past 240 hour(s))  MRSA PCR Screening     Status: None   Collection Time: 10/27/15  5:28 PM  Result Value Ref Range Status   MRSA by PCR NEGATIVE NEGATIVE Final    Comment:        The GeneXpert MRSA Assay (FDA approved for NASAL specimens only), is one component of a comprehensive MRSA colonization surveillance program. It is not intended to diagnose MRSA infection nor to guide or monitor treatment for MRSA infections.      Scheduled Meds: . sodium chloride   Intravenous Once  . HYDROmorphone   Intravenous Q4H  . insulin aspart  0-5 Units Subcutaneous QHS  . insulin aspart  0-9 Units Subcutaneous TID WC  . prochlorperazine  10 mg Intravenous Once  . sodium chloride flush  3 mL Intravenous Q12H     LOS: 4 days    Cherene Altes, MD [facility] Office  [phone] Pager - Text Page per Shea Evans as per below:  On-Call/Text Page:      [email or web address]      password [email or web address]  If 7PM-7AM, please contact night-coverage [email or web address] Password [email or web address] 10/31/2015, 3:29 PM

## 2015-10-31 NOTE — Consult Note (Signed)
   [facility] CM Inpatient Consult   10/31/2015  Miguel [name] [birth year] [id number]   Came to visit Miguel [name] on behalf of [facility] to [name] Care Management program for [facility] employees/dependents with [employer] insurance. His wife is a Furniture conservator/restorer. Mrs. Ancelet was at bedside as well. Denies having any current [employer] [facility] Wellness needs. However, agreeable to post hospital follow up call. Confirmed best contact number as [phone]. [employer] to [employer] and contact information provided. Appreciative of visit.   [name], MSN-Ed, RN,BSN [facility] Liaison [phone]

## 2015-10-31 NOTE — Progress Notes (Signed)
Patient feeling well post surgery which was discussed with Dr. Hulen Skains and thanks to him for his care and the patient feels less nauseated and no signs of obvious bleeding but does reask the same question a few times . Dr. Michail Sermon on call for [place] this weekend please call him if we can be of any further assistance with this hospital stay otherwise postop care per surgical team

## 2015-10-31 NOTE — Progress Notes (Signed)
[facility] Dower Progress Note 1 Day Post-Op  Subjective: Patient very awake, aler tna cooperative this AM.  No distress  Objective: Vital signs in last 24 hours: Temp:  [97.6 F (36.4 C)-98.9 F (37.2 C)] 98.9 F (37.2 C) (08/04 0803) Pulse Rate:  [83-111] 111 (08/04 0803) Resp:  [12-25] 20 (08/04 0803) BP: (109-161)/(45-87) 121/66 (08/04 0803) SpO2:  [95 %-100 %] 95 % (08/04 0803) Arterial Line BP: (106-182)/(60-79) 106/66 (08/03 1800) Weight:  [77.6 kg (171 lb)] 77.6 kg (171 lb) (08/04 0500) Last BM Date: 10/30/15  Intake/Output from previous day: 08/03 0701 - 08/04 0700 In: 7188 [I.V.:5100; Blood:1718; NG/GT:20; IV Piggyback:350] Out: [id number] [Urine:2125; Emesis/NG output:100; Blood:750] Intake/Output this shift: Total I/O In: 20 [NG/GT:20] Out: -   General: No acute distress  Lungs: Clear to auscultation.  IS over 1500.  Sats are good  Abd: Lower abdominal distension, no ileostomy output.  Minimal NGT output  Extremities: No changes.  No clinical signs or symptoms of DVT  Neuro: Intact  Lab Results:  @LABLAST2 (wbc:2,hgb:2,hct:2,plt:2) BMET ) Recent Labs  10/30/15 1636 10/31/15 0450  NA 137 140  K 3.6 3.7  CL 112* 114*  CO2 21* 23  GLUCOSE 150* 128*  BUN 29* 20  CREATININE 0.97 0.96  CALCIUM 6.8* 6.9*   PT/INR  Recent Labs  10/30/15 1245 10/30/15 1636  LABPROT 17.4* 16.7*  INR 1.42 1.34   ABG  Recent Labs  10/30/15 1147  PHART 7.363  HCO3 24.5*    Studies/Results: Dg Chest Port 1 View  Result Date: 10/30/2015 CLINICAL DATA:  Hiatal hernia. EXAM: PORTABLE CHEST 1 VIEW COMPARISON:  10/11/2015. FINDINGS: An oral/nasogastric tube has been placed since the prior study. Tip projects over the left heart border consistent with it residing in a hiatal hernia. It lies above the left hemidiaphragm. There is hazy perihilar and lung base opacity, greater on the left, new since the prior exam. This is likely combination of pleural effusions and atelectasis.  Pneumonia is possible. Remainder of the lungs is clear. No pneumothorax. Cardiac silhouette is normal in size. No mediastinal or hilar masses. IMPRESSION: 1. New lung base opacity, left greater than right, likely combination of pleural effusions and atelectasis. Pneumonia should be considered if there are consistent clinical symptoms. 2. Nasal/orogastric tube tip projects within a moderate size hiatal hernia, above the left hemidiaphragm. Electronically Signed   By: Lajean Manes M.D.   On: 10/30/2015 17:30    Anti-infectives: Anti-infectives    Start     Dose/Rate Route Frequency Ordered Stop   10/30/15 2000  ceFAZolin (ANCEF) IVPB 1 g/50 mL premix     1 g 100 mL/hr over 30 Minutes Intravenous Every 8 hours 10/30/15 1605 10/31/15 1959      Assessment/Plan: s/p Procedure(s): REPAIR OF BLEEDING  ULCER REMOVAL OF DISTAL STOMACH Advance diet Transfer to 6N  Ice chips. PT  LOS: 4 days   Kathryne Eriksson. Dahlia Bailiff, MD, FACS [phone] [facility] Surgery 10/31/2015

## 2015-11-01 LAB — CBC WITH DIFFERENTIAL/PLATELET
BASOS ABS: 0 10*3/uL (ref 0.0–0.1)
BASOS PCT: 0 %
EOS ABS: 0.2 10*3/uL (ref 0.0–0.7)
Eosinophils Relative: 2 %
HCT: 22.9 % — ABNORMAL LOW (ref 39.0–52.0)
HEMOGLOBIN: 7.7 g/dL — AB (ref 13.0–17.0)
Lymphocytes Relative: 9 %
Lymphs Abs: 0.8 10*3/uL (ref 0.7–4.0)
MCH: 31 pg (ref 26.0–34.0)
MCHC: 33.6 g/dL (ref 30.0–36.0)
MCV: 92.3 fL (ref 78.0–100.0)
MONOS PCT: 9 %
Monocytes Absolute: 0.9 10*3/uL (ref 0.1–1.0)
NEUTROS ABS: 8 10*3/uL — AB (ref 1.7–7.7)
NEUTROS PCT: 81 %
Platelets: 184 10*3/uL (ref 150–400)
RBC: 2.48 MIL/uL — ABNORMAL LOW (ref 4.22–5.81)
RDW: 17 % — AB (ref 11.5–15.5)
WBC: 9.9 10*3/uL (ref 4.0–10.5)

## 2015-11-01 LAB — BASIC METABOLIC PANEL
ANION GAP: 7 (ref 5–15)
BUN: 13 mg/dL (ref 6–20)
CALCIUM: 7.2 mg/dL — AB (ref 8.9–10.3)
CHLORIDE: 107 mmol/L (ref 101–111)
CO2: 24 mmol/L (ref 22–32)
CREATININE: 0.97 mg/dL (ref 0.61–1.24)
GFR calc non Af Amer: >60 mL/min (ref >60)
Glucose, Bld: 110 mg/dL — ABNORMAL HIGH (ref 65–99)
Potassium: 3.4 mmol/L — ABNORMAL LOW (ref 3.5–5.1)
SODIUM: 138 mmol/L (ref 135–145)

## 2015-11-01 LAB — GLUCOSE, CAPILLARY
GLUCOSE-CAPILLARY: 110 mg/dL — AB (ref 65–99)
GLUCOSE-CAPILLARY: 109 mg/dL — AB (ref 65–99)
GLUCOSE-CAPILLARY: 111 mg/dL — AB (ref 65–99)
Glucose-Capillary: 100 mg/dL — ABNORMAL HIGH (ref 65–99)

## 2015-11-01 MED ORDER — HYDROMORPHONE HCL 1 MG/ML IJ SOLN
1.0000 mg | INTRAMUSCULAR | Status: DC | PRN
  Administered 2015-11-02 – 2015-11-05 (×7): 1 mg via INTRAVENOUS
  Administered 2015-11-06 (×3): 2 mg via INTRAVENOUS
  Administered 2015-11-06: 1 mg via INTRAVENOUS
  Administered 2015-11-06 – 2015-11-07 (×2): 2 mg via INTRAVENOUS
  Administered 2015-11-07 – 2015-11-08 (×2): 1 mg via INTRAVENOUS
  Filled 2015-11-01 (×6): qty 1
  Filled 2015-11-01: qty 2
  Filled 2015-11-01 (×2): qty 1
  Filled 2015-11-01 (×2): qty 2
  Filled 2015-11-01: qty 1
  Filled 2015-11-01: qty 2
  Filled 2015-11-01 (×2): qty 1
  Filled 2015-11-01: qty 2

## 2015-11-01 MED ORDER — WHITE PETROLATUM GEL
Status: AC
  Administered 2015-11-01: 14:00:00
  Filled 2015-11-01: qty 1

## 2015-11-01 MED ORDER — POTASSIUM CHLORIDE 10 MEQ/100ML IV SOLN
10.0000 meq | INTRAVENOUS | Status: AC
  Administered 2015-11-01 (×2): 10 meq via INTRAVENOUS

## 2015-11-01 MED ORDER — ACETAMINOPHEN 10 MG/ML IV SOLN
1000.0000 mg | Freq: Four times a day (QID) | INTRAVENOUS | Status: AC
  Administered 2015-11-01 – 2015-11-02 (×3): 1000 mg via INTRAVENOUS
  Filled 2015-11-01 (×3): qty 100

## 2015-11-01 MED ORDER — POTASSIUM CHLORIDE 10 MEQ/100ML IV SOLN
10.0000 meq | INTRAVENOUS | Status: DC
  Filled 2015-11-01: qty 100

## 2015-11-01 NOTE — Progress Notes (Signed)
[place] TEAM 1 - Stepdown/ICU TEAM  Miguel [name]  [id number] DOB: September 30, 1950 DOA: 10/27/2015 PCP: Miguel Coffin, MD    Brief Narrative:  65 y.o.M Hx GI bleed related to NSAID use, Gastritis, recent Sigmoid Diverticulitis with perforation S/P Hartmann's procedure, and Nephrolithiasis who presented to the ED c/o hematemesis and syncope.  Wife reported 2 episodes of vomiting large amounts bright red blood. EMS was called and found patient pale and diaphoretic but alert. He then had another episode of emesis bright red blood of 250cc. He denied abdominal pain but did endorse continuous NSAID use over the years. April 2017 he had a perforated sigmoid diverticulitis with surgical repair hartmann procedure and colostomy. He is scheduled to have colostomy reversed August 10.   Subjective: Sitting in the chair in good spirits.  mltiple family members in theroom.  No new complaints.  Does not want dilaudid pCA  Anymore.   Assessment & Plan:  Very large bleeding ulcer / Hiatal hernia / Hematemesis -Prior bleed related to NSAID overuse  -EGD 7/31 noted large HH and gastric ulcer with adherent clot - repeat EGDs with recurrent severe bleeding unable to address bleeding -Taken to the OR 8/3 for repair of bleeding ulcer with partial distal gastrectomy with Billroth II reconstruction and gastrojejunostomy -General Surgery following and directing this portion of his care - clinically stabilizing nicely  Acute Blood loss anemia transfuse as required to keep hemoglobin at 7.0 or greater -hemoglobin around 7.7 today. Monitor daily.   Hypokalemia:  Replete as needed.   Hypotension Blood pressure reasonably controlled.   Hyperglycemia  -A1c 5.9 - CBG not significantly elevated CBG (last 3)   Recent Labs  10/31/15 2139 11/01/15 0829 11/01/15 1204  GLUCAP 105* 110* 109*      Chronic pain syndrome / left shoulder and C-spine arthritis -Pain reasonably controlled at present . - not  asking for any pain meds.  - discontinued the dilaudid PCA on iv Dilaudid 1 to 2 mg IV every 4 hours.   DVT prophylaxis: SCDs Code Status: FULL CODE Family Communication: multiple family members in the room, discussed th plan of care with the patient.  Disposition Plan: pending further recommendations by surgery.   Consultants:  [name]   Procedures: 7/31 EGD 8/2 EGD 8/3 EGD 8/3 partial distal gastrectomy with Billroth II reconstruction and gastrojejunostomy  Antimicrobials:  none  Objective: Blood pressure 132/71, pulse 96, temperature 98.3 F (36.8 C), temperature source Oral, resp. rate 18, height 5\' 5"  (1.651 m), weight 80.3 kg (177 lb 1.6 oz), SpO2 93 %.  Intake/Output Summary (Last 24 hours) at 11/01/15 1718 Last data filed at 11/01/15 1650  Gross per 24 hour  Intake          2647.07 ml  Output             2020 ml  Net           627.07 ml   Filed Weights   10/31/15 0500 10/31/15 1947 11/01/15 0604  Weight: 77.6 kg (171 lb) 80.7 kg (178 lb) 80.3 kg (177 lb 1.6 oz)    Examination: General: No acute respiratory distress Lungs: Clear to auscultation bilaterally without wheeze Cardiovascular: Regular rate and rhythm without murmur gallop or rub normal S1 and S2 Abdomen: Nontender, nondistended, soft, bowel sounds positive - ostomy intact - midline upper abdom wound dressed and dry  Extremities: No significant cyanosis, clubbing, edema bilateral lower extremities  CBC:  Recent Labs Lab 10/27/15 1350  10/30/15  1245 10/30/15 1525 10/30/15 1636 10/31/15 0450 11/01/15 0321  WBC 11.2*  < > 8.2 5.7 7.6 7.4 9.9  NEUTROABS 8.8*  --   --   --  6.6 6.3 8.0*  HGB 5.5*  < > 7.6* 9.3* 9.0* 8.4* 7.7*  HCT 16.5*  < > 22.6* 27.7* 27.4* 24.7* 22.9*  MCV 91.7  < > 87.6 89.1 89.3 89.8 92.3  PLT 320  < > 139* 136* 131* 146* 184  < > = values in this interval not displayed.   Basic Metabolic Panel:  Recent Labs Lab 10/28/15 0431 10/30/15 0329 10/30/15 1147  10/30/15 1636 10/31/15 0450 11/01/15 0321  NA 135 137 140 137 140 138  K 3.9 4.5 4.8 3.6 3.7 3.4*  CL 106 112*  --  112* 114* 107  CO2 25 22  --  21* 23 24  GLUCOSE 105* 160*  --  150* 128* 110*  BUN 15 31*  --  29* 20 13  CREATININE 0.92 1.17  --  0.97 0.96 0.97  CALCIUM 7.5* 6.9*  --  6.8* 6.9* 7.2*   GFR: Estimated Creatinine Clearance: 74.1 mL/min (by C-G formula based on SCr of 0.97 mg/dL).  Liver Function Tests:  Recent Labs Lab 10/30/15 0329  AST 10*  ALT 8*  ALKPHOS 29*  BILITOT 0.6  PROT 3.4*  ALBUMIN 1.7*    HbA1C: Hgb A1c MFr Bld  Date/Time Value Ref Range Status  10/27/2015 01:50 PM 5.9 (H) 4.8 - 5.6 % Final    Comment:    (NOTE)         Pre-diabetes: 5.7 - 6.4         Diabetes: >6.4         Glycemic control for adults with diabetes: <7.0     CBG:  Recent Labs Lab 10/31/15 1135 10/31/15 1706 10/31/15 2139 11/01/15 0829 11/01/15 1204  GLUCAP 118* 113* 105* 110* 109*    Recent Results (from the past 240 hour(s))  MRSA PCR Screening     Status: None   Collection Time: 10/27/15  5:28 PM  Result Value Ref Range Status   MRSA by PCR NEGATIVE NEGATIVE Final    Comment:        The GeneXpert MRSA Assay (FDA approved for NASAL specimens only), is one component of a comprehensive MRSA colonization surveillance program. It is not intended to diagnose MRSA infection nor to guide or monitor treatment for MRSA infections.      Scheduled Meds: . acetaminophen  1,000 mg Intravenous Q6H  . famotidine (PEPCID) IV  20 mg Intravenous Q12H  . insulin aspart  0-5 Units Subcutaneous QHS  . insulin aspart  0-9 Units Subcutaneous TID WC  . prochlorperazine  10 mg Intravenous Once  . sodium chloride flush  3 mL Intravenous Q12H     LOS: 5 days    Miguel Poisson, MD [facility] Office  [phone] Pager - Text Page per Miguel [name] as per below:  On-Call/Text Page:      [name]      password [email or web address]  If 7PM-7AM, please contact  night-coverage [email or web address] Password [email or web address] 11/01/2015, 5:18 PM

## 2015-11-01 NOTE — Evaluation (Signed)
Physical Therapy Evaluation Patient Details Name: Miguel [name] MRN: [id number] DOB: 07/22/1950 Today's Date: 11/01/2015   History of Present Illness  Pt is a 65 y.o. male with medical history significant for gi bleed related to nsaid use, gastritis, and recent sigmoid diverticulitis with perforation s/p Hartmann's procedure.  He presented to the ED with hematemesis and syncope. Initial evaluation revealed acute blood loss anemia likely related to GI bleed in setting of NSAID use.  Clinical Impression  Pt admitted with above diagnosis. Pt currently with functional limitations due to the deficits listed below (see PT Problem List). On eval, pt required min assist for transfers. Standing exercises preformed at bedside with O2 sats decreasing into the 70s and HR increasing to 142. Both quickly returned to [facility] after sitting in recliner. Pt will benefit from skilled PT to increase their independence and safety with mobility to allow discharge to the venue listed below.       Follow Up Recommendations Home health PT;Supervision - Intermittent    Equipment Recommendations  Other (comment) (TBD, may need RW depending on progress)    Recommendations for Other Services       Precautions / Restrictions Precautions Precautions: Fall;Other (comment) Precaution Comments: NG tube with continuous suction.      Mobility  Bed Mobility               General bed mobility comments: Pt received in recliner.  Transfers Overall transfer level: Needs assistance Equipment used: Rolling walker (2 wheeled) Transfers: Sit to/from [facility] Sit to Stand: Min assist Stand pivot transfers: Min assist       General transfer comment: verbal cues for hand placement and sequencing  Ambulation/Gait             General Gait Details: Spoke with Therapist, sports. Pt needs to remain on continuous suction from NG tube. Unable to progress gait due to inability to disconnect suction from NG  tube.  Stairs            Wheelchair Mobility    Modified Rankin (Stroke Patients Only)       Balance Overall balance assessment: No apparent balance deficits (not formally assessed)                                           Pertinent Vitals/Pain Pain Assessment: Faces Faces Pain Scale: Hurts little more Pain Location: abdomen with mobility Pain Descriptors / Indicators: Grimacing;Guarding Pain Intervention(s): Limited activity within patient's tolerance;Monitored during session;Repositioned    Home Living Family/patient expects to be discharged to:: Private residence Living Arrangements: Spouse/significant other Available Help at Discharge: Family;Available 24 hours/day Type of Home: House Home Access: Stairs to enter   [employer] of Steps: 1 Home Layout: One level Home Equipment: None      Prior Function Level of Independence: Independent         Comments: Active. Drives. No mobility restrictions.     Hand Dominance        Extremity/Trunk Assessment   Upper Extremity Assessment: Overall WFL for tasks assessed           Lower Extremity Assessment: Generalized weakness      Cervical / Trunk Assessment: Normal  Communication   Communication: No difficulties  Cognition Arousal/Alertness: Awake/alert Behavior During Therapy: WFL for tasks assessed/performed Overall Cognitive Status: Within Functional Limits for tasks assessed  General Comments      Exercises General Exercises - Lower Extremity Ankle Circles/Pumps: AROM;Both;10 reps;Supine Heel Slides: AROM;Right;Left;10 reps;Supine Hip Flexion/Marching: AROM;Right;Left;20 reps;Standing Mini-Sqauts: AROM;Both;10 reps;Standing      Assessment/Plan    PT Assessment Patient needs continued PT services  PT Diagnosis Difficulty walking;Generalized weakness;Acute pain   PT Problem List Decreased strength;Decreased activity  tolerance;Decreased mobility;Pain;Decreased knowledge of precautions;Decreased safety awareness;Decreased knowledge of use of DME  PT Treatment Interventions DME instruction;Gait training;Stair training;Functional mobility training;Therapeutic activities;Therapeutic exercise;Patient/family education   PT Goals (Current goals can be found in the Care Plan section) Acute Rehab PT Goals Patient Stated Goal: home PT Goal Formulation: With patient/family Time For Goal Achievement: 11/15/15 Potential to Achieve Goals: Good    Frequency Min 3X/week   Barriers to discharge        Co-evaluation               End of Session Equipment Utilized During Treatment: Gait belt Activity Tolerance: Patient tolerated treatment well Patient left: in chair;with family/visitor present;with call bell/phone within reach Nurse Communication: Mobility status         Time: 1335-1403 PT Time Calculation (min) (ACUTE ONLY): 28 min   Charges:   PT Evaluation $PT Eval Moderate Complexity: 1 Procedure PT Treatments $Therapeutic Exercise: 8-22 mins   PT G Codes:        Lorriane Shire 11/01/2015, 2:29 PM

## 2015-11-01 NOTE — Progress Notes (Signed)
2 Days Post-Op  Subjective: Fairly comfortable postop, has not been out of bed. The NG drainage still looks bloody. Some filter change. Bowel sounds are hyperactive. Gas is in the ostomy back swipe of dark tarry looking stool at the end of ileostomy. Dressing okay.  Objective: Vital signs in last 24 hours: Temp:  [98.1 F (36.7 C)-99.3 F (37.4 C)] 99.3 F (37.4 C) (08/05 0604) Pulse Rate:  [105-115] 105 (08/05 0604) Resp:  [12-24] 18 (08/05 0903) BP: (126-134)/(66-76) 134/67 (08/05 0604) SpO2:  [94 %-97 %] 97 % (08/05 0604) Weight:  [80.3 kg (177 lb 1.6 oz)-80.7 kg (178 lb)] 80.3 kg (177 lb 1.6 oz) (08/05 0604) Last BM Date: 10/30/15 60 by mouth 1700 IV fluids 3095 urine Afebrile tachycardic blood pressure is stable sats are good on a nasal cannula. BMP is stable, K+ 3.4. H/H down slightly No films Intake/Output from previous day: 08/04 0701 - 08/05 0700 In: 1835 [P.O.:60; I.V.:1705; NG/GT:20; IV Piggyback:50] Out: [id number] [Urine:3095; Emesis/NG output:10] Intake/Output this shift: Total I/O In: 1350 [I.V.:1300; IV Piggyback:50] Out: 50 [Emesis/NG output:50]  General appearance: alert, cooperative and no distress Resp: clear to auscultation bilaterally GI: Soft, sore, dressing is okay. Bowel sounds are hyperactive. Gas in the ostomy bag small swipe of stool that looks dark end of ostomy. Ostomy looks a bit edematous.  Lab Results:   Recent Labs  10/31/15 0450 11/01/15 0321  WBC 7.4 9.9  HGB 8.4* 7.7*  HCT 24.7* 22.9*  PLT 146* 184    BMET  Recent Labs  10/31/15 0450 11/01/15 0321  NA 140 138  K 3.7 3.4*  CL 114* 107  CO2 23 24  GLUCOSE 128* 110*  BUN 20 13  CREATININE 0.96 0.97  CALCIUM 6.9* 7.2*   PT/INR  Recent Labs  10/30/15 1245 10/30/15 1636  LABPROT 17.4* 16.7*  INR 1.42 1.34     Recent Labs Lab 10/30/15 0329  AST 10*  ALT 8*  ALKPHOS 29*  BILITOT 0.6  PROT 3.4*  ALBUMIN 1.7*     Lipase  No results found for: LIPASE    Studies/Results: Dg Chest Port 1 View  Result Date: 10/30/2015 CLINICAL DATA:  Hiatal hernia. EXAM: PORTABLE CHEST 1 VIEW COMPARISON:  10/11/2015. FINDINGS: An oral/nasogastric tube has been placed since the prior study. Tip projects over the left heart border consistent with it residing in a hiatal hernia. It lies above the left hemidiaphragm. There is hazy perihilar and lung base opacity, greater on the left, new since the prior exam. This is likely combination of pleural effusions and atelectasis. Pneumonia is possible. Remainder of the lungs is clear. No pneumothorax. Cardiac silhouette is normal in size. No mediastinal or hilar masses. IMPRESSION: 1. New lung base opacity, left greater than right, likely combination of pleural effusions and atelectasis. Pneumonia should be considered if there are consistent clinical symptoms. 2. Nasal/orogastric tube tip projects within a moderate size hiatal hernia, above the left hemidiaphragm. Electronically Signed   By: Lajean Manes M.D.   On: 10/30/2015 17:30   Prior to Admission medications   Medication Sig Start Date End Date Taking? Authorizing Provider  acetaminophen (TYLENOL) 500 MG tablet Take 1,000 mg by mouth 3 (three) times daily.    Yes Historical Provider, MD  aspirin EC 81 MG tablet Take 81 mg by mouth daily after supper.    Yes Historical Provider, MD  Calcium Polycarbophil (FIBER-LAX PO) Take 2 capsules by mouth 2 (two) times daily as needed (constipation).   Yes Historical  Provider, MD  diazepam (VALIUM) 2 MG tablet Take 1 tablet (2 mg total) by mouth every 6 (six) hours as needed for muscle spasms. Patient taking differently: Take 1 mg by mouth every 6 (six) hours as needed for muscle spasms.  10/11/15  Yes Lacretia Leigh, MD  ferrous sulfate 325 (65 FE) MG tablet Take 325 mg by mouth daily.   Yes Historical Provider, MD  ibuprofen (ADVIL,MOTRIN) 200 MG tablet Take 200 mg by mouth every 6 (six) hours as needed (pain).   Yes Historical Provider,  MD  lisinopril (PRINIVIL,ZESTRIL) 10 MG tablet Take 5 mg by mouth daily.    Yes Historical Provider, MD  LORazepam (ATIVAN) 1 MG tablet Take 0.5 mg by mouth daily as needed for anxiety.   Yes Historical Provider, MD  meloxicam (MOBIC) 15 MG tablet Take 15 mg by mouth daily.   Yes Historical Provider, MD  Multiple Vitamin (MULTIVITAMIN WITH MINERALS) TABS tablet Take 1 tablet by mouth daily after supper.    Yes Historical Provider, MD  Omega-3 Fatty Acids (FISH OIL) 1200 MG CAPS Take 2,400 mg by mouth daily after supper.    Yes Historical Provider, MD  ondansetron (ZOFRAN) 4 MG tablet Take 4 mg by mouth every 8 (eight) hours as needed for nausea or vomiting.   Yes Historical Provider, MD  oxyCODONE-acetaminophen (PERCOCET/ROXICET) 5-325 MG tablet Take 1-2 tablets by mouth every 4 (four) hours as needed for severe pain. Patient taking differently: Take 0.25-1 tablets by mouth every 4 (four) hours as needed for severe pain.  10/11/15  Yes Lacretia Leigh, MD  Polyethyl Glycol-Propyl Glycol (SYSTANE OP) Place 1 drop into both eyes 3 (three) times daily as needed (dry eyes).   Yes Historical Provider, MD  polyethylene glycol (MIRALAX / GLYCOLAX) packet Take 8.5-17 g by mouth 2 (two) times daily as needed (constipation). Mix in 8 oz liquid and drink   Yes Historical Provider, MD  Red Yeast Rice Extract (RED YEAST RICE PO) Take 2 tablets by mouth daily after supper.    Yes Historical Provider, MD  zolpidem (AMBIEN) 10 MG tablet Take 5 mg by mouth See admin instructions. Take 1/2 tablet (5 mg) by mouth daily at bedtime, may also take another 1/2 tablet if needed during the night   Yes Historical Provider, MD    Medications: . famotidine (PEPCID) IV  20 mg Intravenous Q12H  . HYDROmorphone   Intravenous Q4H  . insulin aspart  0-5 Units Subcutaneous QHS  . insulin aspart  0-9 Units Subcutaneous TID WC  . prochlorperazine  10 mg Intravenous Once  . sodium chloride flush  3 mL Intravenous Q12H   . 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 Judeth Horn Sigmoid diverticulitis with perforation status post sigmoid colectomy and colostomy for/8/17 Dr. [name] FEN:NPO/IV fluids ID:  Preop only DVT:  SCDs only/anemia   Plan: Mobilize some today, continue nothing by mouth. Recheck labs in a.m. Have him do his IS more frequently.  Recheck labs in a.m. Foley out yesterday.    LOS: 5 days    Gilma Bessette 11/01/2015 [phone]

## 2015-11-02 ENCOUNTER — Inpatient Hospital Stay (HOSPITAL_COMMUNITY): Payer: 59

## 2015-11-02 LAB — BASIC METABOLIC PANEL
Anion gap: 10 (ref 5–15)
BUN: 12 mg/dL (ref 6–20)
CALCIUM: 7.2 mg/dL — AB (ref 8.9–10.3)
CO2: 22 mmol/L (ref 22–32)
CREATININE: 0.94 mg/dL (ref 0.61–1.24)
Chloride: 108 mmol/L (ref 101–111)
GFR calc Af Amer: >60 mL/min (ref >60)
Glucose, Bld: 106 mg/dL — ABNORMAL HIGH (ref 65–99)
POTASSIUM: 3.5 mmol/L (ref 3.5–5.1)
SODIUM: 140 mmol/L (ref 135–145)

## 2015-11-02 LAB — CBC
HCT: 25.4 % — ABNORMAL LOW (ref 39.0–52.0)
Hemoglobin: 8.3 g/dL — ABNORMAL LOW (ref 13.0–17.0)
MCH: 30.3 pg (ref 26.0–34.0)
MCHC: 32.7 g/dL (ref 30.0–36.0)
MCV: 92.7 fL (ref 78.0–100.0)
PLATELETS: 247 10*3/uL (ref 150–400)
RBC: 2.74 MIL/uL — AB (ref 4.22–5.81)
RDW: 16.7 % — AB (ref 11.5–15.5)
WBC: 12.4 10*3/uL — AB (ref 4.0–10.5)

## 2015-11-02 LAB — GLUCOSE, CAPILLARY
GLUCOSE-CAPILLARY: 110 mg/dL — AB (ref 65–99)
Glucose-Capillary: 95 mg/dL (ref 65–99)
Glucose-Capillary: 124 mg/dL — ABNORMAL HIGH (ref 65–99)
Glucose-Capillary: 111 mg/dL — ABNORMAL HIGH (ref 65–99)

## 2015-11-02 MED ORDER — DIATRIZOATE MEGLUMINE & SODIUM 66-10 % PO SOLN
ORAL | Status: AC
  Administered 2015-11-02: 45 mL via GASTROSTOMY
  Filled 2015-11-02: qty 90

## 2015-11-02 MED ORDER — KCL IN DEXTROSE-NACL 20-5-0.45 MEQ/L-%-% IV SOLN
INTRAVENOUS | Status: AC
  Administered 2015-11-02 – 2015-11-03 (×3): via INTRAVENOUS
  Filled 2015-11-02 (×3): qty 1000

## 2015-11-02 MED ORDER — ACETAMINOPHEN 10 MG/ML IV SOLN
1000.0000 mg | Freq: Four times a day (QID) | INTRAVENOUS | Status: AC
  Administered 2015-11-02 – 2015-11-03 (×4): 1000 mg via INTRAVENOUS
  Filled 2015-11-02 (×4): qty 100

## 2015-11-02 MED ORDER — ENOXAPARIN SODIUM 40 MG/0.4ML ~~LOC~~ SOLN
40.0000 mg | SUBCUTANEOUS | Status: DC
  Administered 2015-11-02 – 2015-11-05 (×4): 40 mg via SUBCUTANEOUS
  Filled 2015-11-02 (×4): qty 0.4

## 2015-11-02 NOTE — Progress Notes (Signed)
[facility] TEAM 1 - Stepdown/ICU TEAM  Miguel [name]  [id number] DOB: 1950/07/27 DOA: 10/27/2015 PCP: Donnie Coffin, MD    Brief Narrative:  65 y.o.M Hx GI bleed related to NSAID use, Gastritis, recent Sigmoid Diverticulitis with perforation S/P Hartmann's procedure, and Nephrolithiasis who presented to the ED c/o hematemesis and syncope.  Wife reported 2 episodes of vomiting large amounts bright red blood. EMS was called and found patient pale and diaphoretic but alert. He then had another episode of emesis bright red blood of 250cc. He denied abdominal pain but did endorse continuous NSAID use over the years. April 2017 he had a perforated sigmoid diverticulitis with surgical repair hartmann procedure and colostomy. He is scheduled to have colostomy reversed August 10.   Subjective: Sitting in the chair in good spirits.  Wants the NG TUBE out.  He is doing ice chips everyday.   Assessment & Plan:  Very large bleeding ulcer / Hiatal hernia / Hematemesis -Prior bleed related to NSAID overuse  -EGD 7/31 noted large HH and gastric ulcer with adherent clot - repeat EGDs with recurrent severe bleeding unable to address bleeding -Taken to the OR 8/3 for repair of bleeding ulcer with partial distal gastrectomy with Billroth II reconstruction and gastrojejunostomy -General Surgery following and directing this portion of his care - clinically stabilizing nicely - UGI done  Showed There was no leak seen during real-time imaging. However, on delayed images, linear high attenuation is seen in the medial right upper abdomen. While artifact is possible, the fact that the finding only appears on delayed images and not on the initial scout view is concerning for a possible leak. Recommend either a short-term follow-up upper GI or a CT scan for better evaluation.  Further recommendations as per surgery.   Acute Blood loss anemia transfuse as required to keep hemoglobin at 7.0 or greater  -hemoglobin around 8.3 today. Monitor daily.   Hypokalemia:  Replete as needed. Repeat level within normal limits.   Hypotension Blood pressure reasonably controlled. No change in medications.   Hyperglycemia  -A1c 5.9 - CBG not significantly elevated CBG (last 3)   Recent Labs  11/02/15 0744 11/02/15 1408 11/02/15 1641  GLUCAP 95 110* 124*  resume SSI.     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 - not asking for any pain meds.  - discontinued the dilaudid PCA on iv Dilaudid 1 to 2 mg IV every 4 hours.  - encouraged him to get out of bed and walk in the hallway.   DVT prophylaxis: SCDs Code Status: FULL CODE Family Communication: multiple family members in the room, discussed the plan of care with the patient.  Disposition Plan: pending further recommendations by surgery.   Consultants:  [facility]   Procedures: 7/31 EGD 8/2 EGD 8/3 EGD 8/3 partial distal gastrectomy with Billroth II reconstruction and gastrojejunostomy  Antimicrobials:  none  Objective: Blood pressure 128/75, pulse 95, temperature 98.4 F (36.9 C), temperature source Oral, resp. rate 20, height 5\' 5"  (1.651 m), weight 80.3 kg (177 lb 1.6 oz), SpO2 97 %.  Intake/Output Summary (Last 24 hours) at 11/02/15 1850 Last data filed at 11/02/15 1551  Gross per 24 hour  Intake          1806.25 ml  Output             2470 ml  Net          -663.75 ml  Filed Weights   10/31/15 0500 10/31/15 1947 11/01/15 0604  Weight: 77.6 kg (171 lb) 80.7 kg (178 lb) 80.3 kg (177 lb 1.6 oz)    Examination: General: No acute respiratory distress with NG tube in.  Lungs: Clear to auscultation bilaterally without wheeze, no rhonchi.  Cardiovascular: Regular rate and rhythm without murmur gallop or rub normal S1 and S2 Abdomen: Nontender, nondistended, soft, bowel sounds positive - ostomy intact - midline upper abdom wound dressed and dry ,  black coloured liquid int he colostomy.  Extremities: No significant cyanosis, clubbing, edema bilateral lower extremities  CBC:  Recent Labs Lab 10/27/15 1350  10/30/15 1525 10/30/15 1636 10/31/15 0450 11/01/15 0321 11/02/15 0431  WBC 11.2*  < > 5.7 7.6 7.4 9.9 12.4*  NEUTROABS 8.8*  --   --  6.6 6.3 8.0*  --   HGB 5.5*  < > 9.3* 9.0* 8.4* 7.7* 8.3*  HCT 16.5*  < > 27.7* 27.4* 24.7* 22.9* 25.4*  MCV 91.7  < > 89.1 89.3 89.8 92.3 92.7  PLT 320  < > 136* 131* 146* 184 247  < > = values in this interval not displayed.   Basic Metabolic Panel:  Recent Labs Lab 10/30/15 0329 10/30/15 1147 10/30/15 1636 10/31/15 0450 11/01/15 0321 11/02/15 0431  NA 137 140 137 140 138 140  K 4.5 4.8 3.6 3.7 3.4* 3.5  CL 112*  --  112* 114* 107 108  CO2 22  --  21* 23 24 22   GLUCOSE 160*  --  150* 128* 110* 106*  BUN 31*  --  29* 20 13 12   CREATININE 1.17  --  0.97 0.96 0.97 0.94  CALCIUM 6.9*  --  6.8* 6.9* 7.2* 7.2*   GFR: Estimated Creatinine Clearance: 76.5 mL/min (by C-G formula based on SCr of 0.94 mg/dL).  Liver Function Tests:  Recent Labs Lab 10/30/15 0329  AST 10*  ALT 8*  ALKPHOS 29*  BILITOT 0.6  PROT 3.4*  ALBUMIN 1.7*    HbA1C: Hgb A1c MFr Bld  Date/Time Value Ref Range Status  10/27/2015 01:50 PM 5.9 (H) 4.8 - 5.6 % Final    Comment:    (NOTE)         Pre-diabetes: 5.7 - 6.4         Diabetes: >6.4         Glycemic control for adults with diabetes: <7.0     CBG:  Recent Labs Lab 11/01/15 1744 11/01/15 2156 11/02/15 0744 11/02/15 1408 11/02/15 1641  GLUCAP 111* 100* 95 110* 124*    Recent Results (from the past 240 hour(s))  MRSA PCR Screening     Status: None   Collection Time: 10/27/15  5:28 PM  Result Value Ref Range Status   MRSA by PCR NEGATIVE NEGATIVE Final    Comment:        The GeneXpert MRSA Assay (FDA approved for NASAL specimens only), is one component of a comprehensive MRSA colonization surveillance program. It is  not intended to diagnose MRSA infection nor to guide or monitor treatment for MRSA infections.      Scheduled Meds: . acetaminophen  1,000 mg Intravenous Q6H  . enoxaparin (LOVENOX) injection  40 mg Subcutaneous Q24H  . famotidine (PEPCID) IV  20 mg Intravenous Q12H  . insulin aspart  0-5 Units Subcutaneous QHS  . insulin aspart  0-9 Units Subcutaneous TID WC  . prochlorperazine  10 mg Intravenous Once  . sodium chloride flush  3 mL Intravenous Q12H  LOS: 6 days    Hosie Poisson, MD [facility] Office  [phone] Pager - Text Page per Amion as per below:  On-Call/Text Page:      [email or web address]      password [email or web address]  If 7PM-7AM, please contact night-coverage [email or web address] Password [email or web address] 11/02/2015, 6:50 PM

## 2015-11-02 NOTE — Progress Notes (Signed)
3 Days Post-Op  Subjective: Feels well, no flatus, up in chair, wants tube out  Objective: Vital signs in last 24 hours: Temp:  [98.3 F (36.8 C)-100 F (37.8 C)] 100 F (37.8 C) (08/06 0430) Pulse Rate:  [92-103] 92 (08/06 0430) Resp:  [19-21] 19 (08/06 0430) BP: (120-144)/(68-80) 144/80 (08/06 0430) SpO2:  [93 %-97 %] 95 % (08/06 0430) Last BM Date:  (Colostomy)  Intake/Output from previous day: 08/05 0701 - 08/06 0700 In: 3955 [P.O.:60; I.V.:3395; IV Piggyback:500] Out: [id number] [Urine:1465; Emesis/NG output:350; Stool:30] Intake/Output this shift: No intake/output data recorded.  General appearance: no distress Resp: clear to auscultation bilaterally Cardio: regular rate and rhythm GI: dressing clean some gas in bag and stool some bs approp tender  Lab Results:   Recent Labs  11/01/15 0321 11/02/15 0431  WBC 9.9 12.4*  HGB 7.7* 8.3*  HCT 22.9* 25.4*  PLT 184 247   BMET  Recent Labs  11/01/15 0321 11/02/15 0431  NA 138 140  K 3.4* 3.5  CL 107 108  CO2 24 22  GLUCOSE 110* 106*  BUN 13 12  CREATININE 0.97 0.94  CALCIUM 7.2* 7.2*   PT/INR  Recent Labs  10/30/15 1245 10/30/15 1636  LABPROT 17.4* 16.7*  INR 1.42 1.34   ABG  Recent Labs  10/30/15 1147  PHART 7.363  HCO3 24.5*    Studies/Results: No results found.  Anti-infectives: Anti-infectives    Start     Dose/Rate Route Frequency Ordered Stop   10/30/15 2000  ceFAZolin (ANCEF) IVPB 1 g/50 mL premix     1 g 100 mL/hr over 30 Minutes Intravenous Every 8 hours 10/30/15 1605 10/31/15 1218      Assessment/Plan: POD 3 partial distal gastrectomy with Billroth II reconstruction/gastrojejunostomy, 10/30/15 Dr. Judeth Horn Sigmoid diverticulitis with perforation status post sigmoid colectomy and colostomy 4/17 Dr. [name] FEN:NPO/IV fluids change with potassium due to hypokalemia ABL anemia stable DVT:  SCDs , add lovenox again today as bleeding source removed and he is stable Pulm:  continue pulm toilet GI: check ugi today, if fine can get ng out and take sips/chips today       LOS: 5 days  [facility] 11/02/2015

## 2015-11-03 ENCOUNTER — Encounter: Payer: Medicare Other | Admitting: Physical Therapy

## 2015-11-03 LAB — GLUCOSE, CAPILLARY
GLUCOSE-CAPILLARY: 106 mg/dL — AB (ref 65–99)
GLUCOSE-CAPILLARY: 118 mg/dL — AB (ref 65–99)
GLUCOSE-CAPILLARY: 126 mg/dL — AB (ref 65–99)
Glucose-Capillary: 128 mg/dL — ABNORMAL HIGH (ref 65–99)

## 2015-11-03 LAB — CBC
HCT: 23.2 % — ABNORMAL LOW (ref 39.0–52.0)
Hemoglobin: 7.6 g/dL — ABNORMAL LOW (ref 13.0–17.0)
MCH: 29.3 pg (ref 26.0–34.0)
MCHC: 32.8 g/dL (ref 30.0–36.0)
MCV: 89.6 fL (ref 78.0–100.0)
PLATELETS: 312 10*3/uL (ref 150–400)
RBC: 2.59 MIL/uL — AB (ref 4.22–5.81)
RDW: 15.6 % — AB (ref 11.5–15.5)
WBC: 13.6 10*3/uL — AB (ref 4.0–10.5)

## 2015-11-03 LAB — BASIC METABOLIC PANEL
Anion gap: 6 (ref 5–15)
BUN: 10 mg/dL (ref 6–20)
CALCIUM: 7.3 mg/dL — AB (ref 8.9–10.3)
CO2: 26 mmol/L (ref 22–32)
CREATININE: 0.72 mg/dL (ref 0.61–1.24)
Chloride: 108 mmol/L (ref 101–111)
GFR calc Af Amer: >60 mL/min (ref >60)
GLUCOSE: 111 mg/dL — AB (ref 65–99)
POTASSIUM: 3.2 mmol/L — AB (ref 3.5–5.1)
SODIUM: 140 mmol/L (ref 135–145)

## 2015-11-03 LAB — MAGNESIUM: MAGNESIUM: 1.9 mg/dL (ref 1.7–2.4)

## 2015-11-03 LAB — PHOSPHORUS: Phosphorus: 1.4 mg/dL — ABNORMAL LOW (ref 2.5–4.6)

## 2015-11-03 MED ORDER — MAGNESIUM SULFATE 2 GM/50ML IV SOLN
2.0000 g | Freq: Once | INTRAVENOUS | Status: AC
  Administered 2015-11-03: 2 g via INTRAVENOUS
  Filled 2015-11-03 (×2): qty 50

## 2015-11-03 MED ORDER — POTASSIUM CHLORIDE 10 MEQ/100ML IV SOLN: 10.0000 meq | INTRAVENOUS | Status: DC

## 2015-11-03 MED ORDER — FAT EMULSION 20 % IV EMUL
240.0000 mL | INTRAVENOUS | Status: AC
  Administered 2015-11-03: 240 mL via INTRAVENOUS
  Filled 2015-11-03: qty 250

## 2015-11-03 MED ORDER — DEXTROSE 5 % IV SOLN
30.0000 mmol | Freq: Once | INTRAVENOUS | Status: AC
  Administered 2015-11-03: 30 mmol via INTRAVENOUS
  Filled 2015-11-03 (×2): qty 10

## 2015-11-03 MED ORDER — SODIUM CHLORIDE 0.9% FLUSH
10.0000 mL | INTRAVENOUS | Status: DC | PRN
  Administered 2015-11-06: 10 mL
  Filled 2015-11-03: qty 40

## 2015-11-03 MED ORDER — LORAZEPAM 0.5 MG PO TABS
0.5000 mg | ORAL_TABLET | Freq: Once | ORAL | Status: AC | PRN
Start: 2015-11-03 — End: 2015-11-07
  Administered 2015-11-07: 0.5 mg via ORAL
  Filled 2015-11-03: qty 1

## 2015-11-03 MED ORDER — TRACE MINERALS CR-CU-MN-SE-ZN 10-1000-500-60 MCG/ML IV SOLN
INTRAVENOUS | Status: AC
  Administered 2015-11-03: 18:00:00 via INTRAVENOUS
  Filled 2015-11-03: qty 960

## 2015-11-03 MED ORDER — ACETAMINOPHEN 10 MG/ML IV SOLN
500.0000 mg | Freq: Once | INTRAVENOUS | Status: AC
  Administered 2015-11-03: 500 mg via INTRAVENOUS
  Filled 2015-11-03: qty 100

## 2015-11-03 MED ORDER — KCL IN DEXTROSE-NACL 20-5-0.45 MEQ/L-%-% IV SOLN
INTRAVENOUS | Status: DC
Start: 2015-11-03 — End: 2015-11-05
  Administered 2015-11-03 – 2015-11-04 (×2): via INTRAVENOUS
  Filled 2015-11-03: qty 1000

## 2015-11-03 MED ORDER — PHENOL 1.4 % MT LIQD
1.0000 | OROMUCOSAL | Status: DC | PRN
  Administered 2015-11-04: 1 via OROMUCOSAL
  Filled 2015-11-03: qty 177

## 2015-11-03 MED ORDER — INSULIN ASPART 100 UNIT/ML ~~LOC~~ SOLN
0.0000 [IU] | Freq: Four times a day (QID) | SUBCUTANEOUS | Status: DC
  Administered 2015-11-03: 2 [IU] via SUBCUTANEOUS
  Administered 2015-11-04 – 2015-11-06 (×6): 1 [IU] via SUBCUTANEOUS

## 2015-11-03 NOTE — Progress Notes (Signed)
[name] TEAM 1 - Stepdown/ICU TEAM  Miguel [name]  [id number] DOB: [birth year] DOA: 10/27/2015 PCP: Donnie Coffin, MD    Brief Narrative:  65 y.o.M Hx GI bleed related to NSAID use, Gastritis, recent Sigmoid Diverticulitis with perforation S/P Hartmann's procedure, and Nephrolithiasis who presented to the ED c/o hematemesis and syncope.  Wife reported 2 episodes of vomiting large amounts bright red blood. EMS was called and found patient pale and diaphoretic but alert. He then had another episode of emesis bright red blood of 250cc. He denied abdominal pain but did endorse continuous NSAID use over the years. April 2017 he had a perforated sigmoid diverticulitis with surgical repair hartmann procedure and colostomy. He is scheduled to have colostomy reversed August 10.   Subjective: Sitting in the chair in good spirits.  Wants the NG TUBE out.  He is doing ice chips everyday.   Assessment & Plan:  Very large bleeding ulcer / Hiatal hernia / Hematemesis -Prior bleed related to NSAID overuse  -EGD 7/31 noted large HH and gastric ulcer with adherent clot - repeat EGDs with recurrent severe bleeding unable to address bleeding -Taken to the OR 8/3 for repair of bleeding ulcer with partial distal gastrectomy with Billroth II reconstruction and gastrojejunostomy -General Surgery following and directing this portion of his care - clinically stabilizing nicely - UGI done  Showed There was no leak seen during real-time imaging. However, on delayed images, linear high attenuation is seen in the medial right upper abdomen. While artifact is possible, the fact that the finding only appears on delayed images and not on the initial scout view is concerning for a possible leak. Recommend either a short-term follow-up upper GI or a CT scan for better evaluation.  Continue with NPO becauser of the above.  NG tube to low intermittent wall suction, repeat UGI in a few days.  Plan to start TPN, plan  for PICCl ine.  Further recommendations as per surgery.   Acute Blood loss anemia transfuse as required to keep hemoglobin at 7.0 or greater -hemoglobin around 8.    Hypokalemia:  Replete as needed. Repeat level within normal limits.   Hypotension Blood pressure reasonably controlled. No change in medications.   Hypophosphatemia: Replete and rpeat in am.   Hyperglycemia  -A1c 5.9 - CBG not significantly elevated CBG (last 3)   Recent Labs  11/03/15 0822 11/03/15 1215 11/03/15 1732  GLUCAP 106* 118* 126*  resume SSI.     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 - not asking for any pain meds.  - discontinued the dilaudid PCA on iv Dilaudid 1 to 2 mg IV every 4 hours.  - encouraged him to get out of bed and walk in the hallway.   DVT prophylaxis: SCDs Code Status: FULL CODE Family Communication: multiple family members in the room, discussed the plan of care with the patient.  Disposition Plan: pending further recommendations by surgery.   Consultants:  [facility]   Procedures: 7/31 EGD 8/2 EGD 8/3 EGD 8/3 partial distal gastrectomy with Billroth II reconstruction and gastrojejunostomy  Antimicrobials:  none  Objective: Blood pressure 134/75, pulse 90, temperature (!) 100.9 F (38.3 C), temperature source Tympanic, resp. rate 19, height 5\' 5"  (1.651 m), weight 80.3 kg (177 lb 1.6 oz), SpO2 95 %.  Intake/Output Summary (Last 24 hours) at 11/03/15 1854 Last data filed at 11/03/15 1725  Gross per 24 hour  Intake  1444 ml  Output             1050 ml  Net              394 ml   Filed Weights   10/31/15 0500 10/31/15 1947 11/01/15 0604  Weight: 77.6 kg (171 lb) 80.7 kg (178 lb) 80.3 kg (177 lb 1.6 oz)    Examination: General: No acute respiratory distress with NG tube still in. Lungs: Clear to auscultation bilaterally without wheeze, no rhonchi.  Cardiovascular: Regular  rate and rhythm without murmur gallop or rub normal S1 and S2 Abdomen: Nontender, nondistended, soft, bowel sounds positive - ostomy intact - midline upper abdom wound dressed and dry , black coloured liquid int he colostomy.  Extremities: No significant cyanosis, clubbing, edema bilateral lower extremities  CBC:  Recent Labs Lab 10/30/15 1525 10/30/15 1636 10/31/15 0450 11/01/15 0321 11/02/15 0431  WBC 5.7 7.6 7.4 9.9 12.4*  NEUTROABS  --  6.6 6.3 8.0*  --   HGB 9.3* 9.0* 8.4* 7.7* 8.3*  HCT 27.7* 27.4* 24.7* 22.9* 25.4*  MCV 89.1 89.3 89.8 92.3 92.7  PLT 136* 131* 146* 184 247     Basic Metabolic Panel:  Recent Labs Lab 10/30/15 1636 10/31/15 0450 11/01/15 0321 11/02/15 0431 11/03/15 1022  NA 137 140 138 140 140  K 3.6 3.7 3.4* 3.5 3.2*  CL 112* 114* 107 108 108  CO2 21* 23 24 22 26   GLUCOSE 150* 128* 110* 106* 111*  BUN 29* 20 13 12 10   CREATININE 0.97 0.96 0.97 0.94 0.72  CALCIUM 6.8* 6.9* 7.2* 7.2* 7.3*  MG  --   --   --   --  1.9  PHOS  --   --   --   --  1.4*   GFR: Estimated Creatinine Clearance: 89.8 mL/min (by C-G formula based on SCr of 0.8 mg/dL).  Liver Function Tests:  Recent Labs Lab 10/30/15 0329  AST 10*  ALT 8*  ALKPHOS 29*  BILITOT 0.6  PROT 3.4*  ALBUMIN 1.7*    HbA1C: Hgb A1c MFr Bld  Date/Time Value Ref Range Status  10/27/2015 01:50 PM 5.9 (H) 4.8 - 5.6 % Final    Comment:    (NOTE)         Pre-diabetes: 5.7 - 6.4         Diabetes: >6.4         Glycemic control for adults with diabetes: <7.0     CBG:  Recent Labs Lab 11/02/15 1641 11/02/15 2224 11/03/15 0822 11/03/15 1215 11/03/15 1732  GLUCAP 124* 111* 106* 118* 126*    Recent Results (from the past 240 hour(s))  MRSA PCR Screening     Status: None   Collection Time: 10/27/15  5:28 PM  Result Value Ref Range Status   MRSA by PCR NEGATIVE NEGATIVE Final    Comment:        The GeneXpert MRSA Assay (FDA approved for NASAL specimens only), is one component of  a comprehensive MRSA colonization surveillance program. It is not intended to diagnose MRSA infection nor to guide or monitor treatment for MRSA infections.      Scheduled Meds: . acetaminophen  500 mg Intravenous Once  . enoxaparin (LOVENOX) injection  40 mg Subcutaneous Q24H  . insulin aspart  0-9 Units Subcutaneous Q6H  . potassium phosphate IVPB (mmol)  30 mmol Intravenous Once  . prochlorperazine  10 mg Intravenous Once  . sodium chloride flush  3 mL Intravenous Q12H  LOS: 7 days    Hosie Poisson, MD [facility] Office  [phone] Pager - Text Page per Amion as per below:  On-Call/Text Page:      [email or web address]      password [email or web address]  If 7PM-7AM, please contact night-coverage [email or web address] Password [facility] 11/03/2015, 6:54 PM

## 2015-11-03 NOTE — Progress Notes (Addendum)
Nutrition Follow-up  DOCUMENTATION CODES:   Not applicable  INTERVENTION:   -TPN management per pharmacy -RD will follow for diet advancement  NUTRITION DIAGNOSIS:   Inadequate oral intake related to altered GI function as evidenced by other (see comment) (clear liquid diet).  Ongoing  GOAL:   Patient will meet greater than or equal to 90% of their needs  Unmet  MONITOR:   PO intake, Supplement acceptance, Diet advancement, I & O's  REASON FOR ASSESSMENT:   Consult New TPN/TNA  ASSESSMENT:   65 y.o. male with a Past Medical History of CKD, gastritis, HLD, diverticulosis w/ perforation,  who presents with acute GI bleed in need of EGD.  S/p Procedure(s) on 10/30/15: REPAIR OF BLEEDING  ULCER PARTIAL DISTAL GASTRECTOMY WITH BILROTH II RECONSTRUCTION/GASTROJEJUNOSTOMY  Pt working with physical therapy at time of visit.   Per [facility] notes, concern for possible leak at the anastomosis.  NGT placed on 10/30/15; noted 650 ml output within the past 24 hours.  Reviewed pharmacy note. Plan to start Clinimix E 5/15 at 40 mL/hr and 20% IV lipid emulsion at 10 mL/hr, which provides 1162 kcals and 48 grams protein (65% of estimated kcal needs and 56% of estimated protein needs). PICC to be placed today to initiated TPN at 1800.   Labs reviewed: K: 3.2 (on IV supplementation), CBGS: 106-124.   Diet Order:  Diet NPO time specified Except for: Ice Chips TPN (CLINIMIX-E) Adult  Skin:  Reviewed, no issues  Last BM:  11/02/15  Height:   Ht Readings from Last 1 Encounters:  10/31/15 5\' 5"  (1.651 m)    Weight:   Wt Readings from Last 1 Encounters:  11/01/15 177 lb 1.6 oz (80.3 kg)    Ideal Body Weight:  61.8 kg  BMI:  Body mass index is 29.47 kg/m.  Estimated Nutritional Needs:   Kcal:  1800-2000  Protein:  85-100 gm  Fluid:  1.8-2 L  EDUCATION NEEDS:   No education needs identified at this time  [place] A. Jimmye Norman, RD, LDN, CDE Pager: [phone] After hours  Pager: [phone]

## 2015-11-03 NOTE — Progress Notes (Signed)
Pt has had a fever, got a little confused and does not feel like himself, wife requesting surgeon be called, on call MD paged and waiting for response

## 2015-11-03 NOTE — Care Management Note (Signed)
Case Management Note  Patient Details  Name: Miguel [name] MRN: [id number] Date of Birth: 12-04-1950  Subjective/Objective:                    Action/Plan:  Will continue to follow to see if [facility] dressing change TPN needed. Expected Discharge Date:                  Expected Discharge Plan:  [name]  In-House Referral:     Discharge planning Services  CM Consult  Post Acute Care Choice:  Home Health Choice offered to:  Patient, Spouse  DME Arranged:  [place] rolling DME Agency:  [place]:  PT [facility] Agency:  [place]  Status of Service:  In process, will continue to follow  If discussed at Long Length of Stay Meetings, dates discussed:    Additional Comments:  Marilu Favre, RN 11/03/2015, 11:27 AM

## 2015-11-03 NOTE — Progress Notes (Signed)
Patient ID: Miguel [name], male   DOB: [birth year], 65 y.o.   MRN: [id number] Patient with some confusion earlier, associated with low grade fever, remainder of vitals normal, he is now a/o x 3 and mentating better, abd is approp tender with nothing concerning, wbc up a little from two days ago, will continue to monitor for now, tylenol given iv earlier.  Discussed with patient and wife

## 2015-11-03 NOTE — Progress Notes (Signed)
Peripherally Inserted Central Catheter/Midline Placement  The IV Nurse has discussed with the patient and/or persons authorized to consent for the patient, the purpose of this procedure and the potential benefits and risks involved with this procedure.  The benefits include less needle sticks, lab draws from the catheter and patient may be discharged home with the catheter.  Risks include, but not limited to, infection, bleeding, blood clot (thrombus formation), and puncture of an artery; nerve damage and irregular heat beat.  Alternatives to this procedure were also discussed.  PICC/Midline Placement Documentation        Miguel [name] 11/03/2015, 3:37 PM

## 2015-11-03 NOTE — Progress Notes (Signed)
PT Cancellation Note  Patient Details Name: Miguel [name] MRN: [id number] DOB: 04/18/1950   Cancelled Treatment:    Reason Eval/Treat Not Completed: Pain limiting ability to participate.  Pt reports 5-6/10 pain in abdomen and does not feel up to trying walking with PT.  I educated him on the importance of mobility despite pain (RN gave pain meds just prior to my arrival) and he continued to refuse.  He was agreeable for PT to check back tomorrow.  Thanks,    Barbarann Ehlers. [place], [place], DPT [phone]   11/03/2015, 2:25 PM

## 2015-11-03 NOTE — Progress Notes (Signed)
PARENTERAL NUTRITION CONSULT NOTE - INITIAL  Pharmacy Consult for TPN Indication: s/p distal gastrectomy and bilroth II procedure with possible leak  Allergies  Allergen Reactions  . No Known Allergies     Patient Measurements: Height: 5\' 5"  (165.1 cm) Weight: 177 lb 1.6 oz (80.3 kg) IBW/kg (Calculated) : 61.5 Adjusted Body Weight: 67.1 kg  Vital Signs:   Intake/Output from previous day: 08/06 0701 - 08/07 0700 In: 2015.3 [P.O.:120; I.V.:1505.3; NG/GT:190; IV Piggyback:200] Out: 1850 [Urine:800; Emesis/NG output:500; Stool:550] Intake/Output from this shift: No intake/output data recorded.  Labs:  Recent Labs  11/01/15 0321 11/02/15 0431  WBC 9.9 12.4*  HGB 7.7* 8.3*  HCT 22.9* 25.4*  PLT 184 247     Recent Labs  11/01/15 0321 11/02/15 0431  NA 138 140  K 3.4* 3.5  CL 107 108  CO2 24 22  GLUCOSE 110* 106*  BUN 13 12  CREATININE 0.97 0.94  CALCIUM 7.2* 7.2*   Estimated Creatinine Clearance: 76.5 mL/min (by C-G formula based on SCr of 0.94 mg/dL).    Recent Labs  11/02/15 1641 11/02/15 2224 11/03/15 0822  GLUCAP 124* 111* 106*    Medical History: Past Medical History:  Diagnosis Date  . Anemia   . Anxiety   . Gastritis   . GI bleed due to NSAIDs 10/27/2015  . Hyperlipidemia   . Hypertension   . Kidney stones   . 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 Sigmoid diverticulitis    with perforation   Insulin Requirements in the past 24 hours:  1 unit of sensitive SSI TIDWC + HS.   Current Nutrition:  NPO since 8/2  Assessment: 65 year old male POD # 4 partial distal gastrectomy and bilroth II reconstruction/gastrojejunostomy with possible leak seen on upper GI scan. Patient has been NPO for 6 days and due to possible anastomotic leak will need to remain NPO and meets requirements for TPN. TPN to initiate 8/7 and surgery plans to repeat UGI in 48 hours.   Surgeries/Procedures: 06/2015 - sigmoid colectomy and colostomy s/p sigmoid  diverticulits with perforation 10/30/15- partial distal gastrectomy with Billroth II reconstruction/gastrojejunostomy 11/02/15 - UGI scan found possible leak   GI: UGI scan yesterday significant for possible anastomotic leak and surgery plans for extended NPO status and initiating TPN. NG tube output 500 mL & colostomy output 550 mL yesterday. Famotidine 20 IV q12. Albumin on 8/2 was low at 1.7.  Endo: CBGs controlled on sensitive SSI. A1c 5.9.   Lytes: K 3.2, Phos 1.4, and Mg 1.9 are all low and will be replaced today prior to starting TPN. IVF contain potassium as well.   Renal: SCr improved at 0.72. UOP decreased yesterday at 0.4 mL/kg/hr. Net + 9L. D5-1/2NS + 20K @ 75 mL/hr.   Pulm: RA  Cards: BP ok, HR 90s.   Hepatobil: LFTs and Tbili on 8/2 were wnl.   Neuro: Pain score 3/10 on scheduled IV Tylenol, Prn Dilaudid  ID:   WBC on 8/6 was 12.4. Afebrile. No signs of infection. No antibiotics at this time.   Best Practices: Lovenox 40 sq daily TPN Access: PICC 8/7 >> TPN start date: 8/7 >>   Nutritional Goals: follow-up RD goals kCal,  grams of protein per day  Plan:  Start Clinimix E 5/15 at 40 mL/hr.  Start 20% IV lipid emulsion at 10 mL/hr.  This provides g protein and kcal.  Change to Sensitive SSI every 6 hours while on TPN.  Reduce D5-1/2NS +  20K to 35 mL/hr at 1800.  Add MVI and Trace Elements to TPN bag. Add Famotidine 40 mg to TPN and stop IV Famotidine.  Follow-up repeat UGI scan at 48 hours and ability to transition to enteral nutrition.  Give KPhos 28mmol (provides ~65mEq of potassium). Give Magnesium 2g IV x1.    Sloan Leiter, PharmD, BCPS Clinical Pharmacist [phone] 11/03/2015,9:18 AM

## 2015-11-03 NOTE — Progress Notes (Signed)
[facility] Progress Note  4 Days Post-Op  Subjective: Only concern today is swelling in BL LE and penis. Reports mild abdominal soreness, worse with movement. No nausea, vomiting. Denies fever/chills. Ambulated in his room today but states short NG tubing limits mobility.  NGT OP 500cc/24h ( w/ ice chips) Objective: Vital signs in last 24 hours: Temp:  [98.4 F (36.9 C)-98.7 F (37.1 C)] 98.6 F (37 C) (08/06 2024) Pulse Rate:  [91-98] 91 (08/06 2024) Resp:  [19-20] 19 (08/06 2024) BP: (128-134)/(68-75) 134/70 (08/06 2024) SpO2:  [97 %-99 %] 98 % (08/06 2024) Last BM Date: 11/02/15  Intake/Output from previous day: 08/06 0701 - 08/07 0700 In: 2015.3 [P.O.:120; I.V.:1505.3; NG/GT:190; IV Piggyback:200] Out: 1850 [Urine:800; Emesis/NG output:500; Stool:550] Intake/Output this shift: No intake/output data recorded.  PE: Gen:  Alert, NAD, pleasant Card:  RRR, no M/G/R heard Pulm:  CTA, no W/R/R Abd: Soft, appropriately tender, ND, +BS, ostomy in place, stoma viable, emptied large volume of dark liquid this AM (s/p UGI).  GU: mild swelling at proximal aspect of penis, no scrotal swelling appreciated. Non-tender.  Ext: mild swelling, no erythema, tenderness, or pitting edema.   Lab Results:   Recent Labs  11/01/15 0321 11/02/15 0431  WBC 9.9 12.4*  HGB 7.7* 8.3*  HCT 22.9* 25.4*  PLT 184 247   BMET  Recent Labs  11/01/15 0321 11/02/15 0431  NA 138 140  K 3.4* 3.5  CL 107 108  CO2 24 22  GLUCOSE 110* 106*  BUN 13 12  CREATININE 0.97 0.94  CALCIUM 7.2* 7.2*   PT/INR No results for input(s): LABPROT, INR in the last 72 hours. CMP     Component Value Date/Time   NA 140 11/02/2015 0431   K 3.5 11/02/2015 0431   CL 108 11/02/2015 0431   CO2 22 11/02/2015 0431   GLUCOSE 106 (H) 11/02/2015 0431   BUN 12 11/02/2015 0431   CREATININE 0.94 11/02/2015 0431   CALCIUM 7.2 (L) 11/02/2015 0431   PROT 3.4 (L) 10/30/2015 0329   ALBUMIN 1.7 (L)  10/30/2015 0329   AST 10 (L) 10/30/2015 0329   ALT 8 (L) 10/30/2015 0329   ALKPHOS 29 (L) 10/30/2015 0329   BILITOT 0.6 10/30/2015 0329   GFRNONAA >60 11/02/2015 0431   GFRAA >60 11/02/2015 0431   Lipase  No results found for: LIPASE     Studies/Results: Dg Ugi  W/kub  Addendum Date: 11/02/2015   ADDENDUM REPORT: 11/02/2015 16:06 ADDENDUM: The findings were called to the patient's physician, Dr. Brantley Stage Electronically Signed   By: Dorise Bullion III M.D   On: 11/02/2015 16:06   Result Date: 11/02/2015 CLINICAL DATA:  Recent antrectomy with gastrojejunostomy. Evaluate for leak. EXAM: WATER SOLUBLE UPPER GI SERIES TECHNIQUE: Single-column upper GI series was performed using water soluble contrast. CONTRAST:  60 cc of Gastrografin COMPARISON:  None FLUOROSCOPY TIME:  If the device does not provide the exposure index: Fluoroscopy Time (in minutes and seconds):  2.3 minutes Number of Acquired Images: 15 saved images. No individual exposures. FINDINGS: On the initial scout view, no radiopaque material overlies the lower chest or upper abdomen. Skin staples were seen. An NG tube is seen in the left retrocardiac region, at the site of surgery. During real-time imaging, 60 cc of Gastrografin was injected. No leak was seen during real-time imaging. However, on delayed imaging, there is a linear region of high attenuation, equal to the attenuation of contrast, in the medial right upper abdomen. The patient's  clothing was removed from this region and the finding persisted on a repeat KUB. This finding was not seen on the original scout view. IMPRESSION: There was no leak seen during real-time imaging. However, on delayed images, linear high attenuation is seen in the medial right upper abdomen. While artifact is possible, the fact that the finding only appears on delayed images and not on the initial scout view is concerning for a possible leak. Recommend either a short-term follow-up upper GI or a CT scan for  better evaluation. The findings will be called to the referring clinician by myself. Electronically Signed: By: Dorise Bullion III M.D On: 11/02/2015 12:32    Anti-infectives: Anti-infectives    Start     Dose/Rate Route Frequency Ordered Stop   10/30/15 2000  ceFAZolin (ANCEF) IVPB 1 g/50 mL premix     1 g 100 mL/hr over 30 Minutes Intravenous Every 8 hours 10/30/15 1605 10/31/15 1218     Assessment/Plan POD #4 partial distal gastrectomy with Billroth II reconstruction/gastrojejunostomy, 10/30/15 Dr. Judeth Horn Sigmoid diverticulitis with perforation status post sigmoid colectomy and colostomy, 4/17 Dr. [name] - UGI study significant for linear high attenuation in right upper abdomen on delayed images - cannot exclude anastomotic leak.  - Plan to keep NGT in place and repeat UGI Wednesday/thursday of this week. Patient is clinically improving after surgery. - Leukocytosis - WBC up to 12.4 today from 9.9 yesterday. No signs/sxs of DVT. Afebrile. Normal heart rate and respiratory rate. No signs of developing PNA. Early for development of intra-abdominal abscess.  ABL anemia stable; H&H trending back up (8.3/25.4)  FEN:NPO/IVF, start TPN today DVT: SCDs , Lovenox Pulm: continue pulm toilet  Plan: TPN and repeat UGI in 48 h  Continue NGT to LIS  Replace honeycomb dressing with gauze dressing. Ambulate    LOS: 7 days    Jill Alexanders , [facility] 11/03/2015, 8:51 AM Pager: [phone] Consults: [phone] Mon-Fri 7:00 am-4:30 pm Sat-Sun 7:00 am-11:30 am

## 2015-11-04 ENCOUNTER — Inpatient Hospital Stay (HOSPITAL_COMMUNITY): Payer: 59

## 2015-11-04 ENCOUNTER — Encounter (HOSPITAL_COMMUNITY): Payer: Self-pay | Admitting: Radiology

## 2015-11-04 ENCOUNTER — Inpatient Hospital Stay (HOSPITAL_COMMUNITY)
Admission: RE | Admit: 2015-11-04 | Discharge: 2015-11-04 | Disposition: A | Discharge status: Home / Self Care | Payer: Medicare Other | Source: Ambulatory Visit

## 2015-11-04 LAB — BASIC METABOLIC PANEL
Anion gap: 5 (ref 5–15)
BUN: 8 mg/dL (ref 6–20)
CALCIUM: 6.9 mg/dL — AB (ref 8.9–10.3)
CO2: 26 mmol/L (ref 22–32)
CREATININE: 0.73 mg/dL (ref 0.61–1.24)
Chloride: 103 mmol/L (ref 101–111)
GFR calc non Af Amer: >60 mL/min (ref >60)
Glucose, Bld: 145 mg/dL — ABNORMAL HIGH (ref 65–99)
Potassium: 3.7 mmol/L (ref 3.5–5.1)
SODIUM: 134 mmol/L — AB (ref 135–145)

## 2015-11-04 LAB — PREALBUMIN: PREALBUMIN: 4.5 mg/dL — AB (ref 18–38)

## 2015-11-04 LAB — COMPREHENSIVE METABOLIC PANEL
ALBUMIN: 1.5 g/dL — AB (ref 3.5–5.0)
ALK PHOS: 42 U/L (ref 38–126)
ALT: 9 U/L — ABNORMAL LOW (ref 17–63)
AST: 13 U/L — AB (ref 15–41)
Anion gap: 7 (ref 5–15)
BILIRUBIN TOTAL: 0.4 mg/dL (ref 0.3–1.2)
BUN: 8 mg/dL (ref 6–20)
CALCIUM: 6.8 mg/dL — AB (ref 8.9–10.3)
CO2: 25 mmol/L (ref 22–32)
Chloride: 104 mmol/L (ref 101–111)
Creatinine, Ser: 0.61 mg/dL (ref 0.61–1.24)
GFR calc Af Amer: >60 mL/min (ref >60)
GFR calc non Af Amer: >60 mL/min (ref >60)
GLUCOSE: 114 mg/dL — AB (ref 65–99)
Potassium: 3.3 mmol/L — ABNORMAL LOW (ref 3.5–5.1)
Sodium: 136 mmol/L (ref 135–145)
TOTAL PROTEIN: 4 g/dL — AB (ref 6.5–8.1)

## 2015-11-04 LAB — GLUCOSE, CAPILLARY
GLUCOSE-CAPILLARY: 119 mg/dL — AB (ref 65–99)
GLUCOSE-CAPILLARY: 132 mg/dL — AB (ref 65–99)
Glucose-Capillary: 121 mg/dL — ABNORMAL HIGH (ref 65–99)
Glucose-Capillary: 134 mg/dL — ABNORMAL HIGH (ref 65–99)

## 2015-11-04 LAB — PHOSPHORUS: Phosphorus: 3 mg/dL (ref 2.5–4.6)

## 2015-11-04 LAB — PREPARE RBC (CROSSMATCH)

## 2015-11-04 LAB — MAGNESIUM: Magnesium: 1.9 mg/dL (ref 1.7–2.4)

## 2015-11-04 LAB — CBC
HEMATOCRIT: 21.9 % — AB (ref 39.0–52.0)
HEMOGLOBIN: 7.2 g/dL — AB (ref 13.0–17.0)
MCH: 30 pg (ref 26.0–34.0)
MCHC: 32.9 g/dL (ref 30.0–36.0)
MCV: 91.3 fL (ref 78.0–100.0)
Platelets: 309 10*3/uL (ref 150–400)
RBC: 2.4 MIL/uL — AB (ref 4.22–5.81)
RDW: 16 % — ABNORMAL HIGH (ref 11.5–15.5)
WBC: 13.9 10*3/uL — ABNORMAL HIGH (ref 4.0–10.5)

## 2015-11-04 LAB — TRIGLYCERIDES: Triglycerides: 89 mg/dL (ref <150)

## 2015-11-04 MED ORDER — IOPAMIDOL (ISOVUE-300) INJECTION 61%
INTRAVENOUS | Status: AC
  Administered 2015-11-04: 100 mL
  Filled 2015-11-04: qty 100

## 2015-11-04 MED ORDER — ACETAMINOPHEN 10 MG/ML IV SOLN
500.0000 mg | Freq: Once | INTRAVENOUS | Status: AC
  Administered 2015-11-04: 500 mg via INTRAVENOUS
  Filled 2015-11-04: qty 100

## 2015-11-04 MED ORDER — POTASSIUM CHLORIDE 10 MEQ/50ML IV SOLN
10.0000 meq | INTRAVENOUS | Status: AC
  Administered 2015-11-04 (×4): 10 meq via INTRAVENOUS
  Filled 2015-11-04 (×5): qty 50

## 2015-11-04 MED ORDER — FAT EMULSION 20 % IV EMUL
240.0000 mL | INTRAVENOUS | Status: AC
  Administered 2015-11-04: 240 mL via INTRAVENOUS
  Filled 2015-11-04 (×2): qty 250

## 2015-11-04 MED ORDER — POTASSIUM CHLORIDE 10 MEQ/100ML IV SOLN
10.0000 meq | INTRAVENOUS | Status: AC
Start: 2015-11-04 — End: 2015-11-04
  Administered 2015-11-04 (×2): 10 meq via INTRAVENOUS
  Filled 2015-11-04 (×2): qty 100

## 2015-11-04 MED ORDER — TRACE MINERALS CR-CU-MN-SE-ZN 10-1000-500-60 MCG/ML IV SOLN
INTRAVENOUS | Status: AC
  Administered 2015-11-04: 17:00:00 via INTRAVENOUS
  Filled 2015-11-04: qty 1440

## 2015-11-04 MED ORDER — MAGNESIUM SULFATE 2 GM/50ML IV SOLN
2.0000 g | Freq: Once | INTRAVENOUS | Status: AC
  Administered 2015-11-04: 2 g via INTRAVENOUS
  Filled 2015-11-04 (×2): qty 50

## 2015-11-04 MED ORDER — SODIUM CHLORIDE 0.9 % IV SOLN
Freq: Once | INTRAVENOUS | Status: AC
  Administered 2015-11-04: via INTRAVENOUS

## 2015-11-04 NOTE — Progress Notes (Addendum)
PARENTERAL NUTRITION CONSULT NOTE - FOLLOW UP  Pharmacy Consult for TPN Indication: s/p distal gastrectomy and bilroth II procedure with possible leak  Allergies  Allergen Reactions  . No Known Allergies     Patient Measurements: Height: 5\' 5"  (165.1 cm) Weight: 176 lb 14.7 oz (80.2 kg) IBW/kg (Calculated) : 61.5 Adjusted Body Weight: 67.1 kg  Vital Signs: Temp: 99.9 F (37.7 C) (08/08 0535) Temp Source: Oral (08/08 0535) BP: 125/72 (08/08 0535) Pulse Rate: 96 (08/08 0535) Intake/Output from previous day: 08/07 0701 - 08/08 0700 In: 1058.3 [I.V.:1058.3] Out: 1150 [Urine:600; Emesis/NG output:500; Stool:50] Intake/Output from this shift: No intake/output data recorded.  Labs:  Recent Labs  11/02/15 0431 11/03/15 2049 11/04/15 0500  WBC 12.4* 13.6* 13.9*  HGB 8.3* 7.6* 7.2*  HCT 25.4* 23.2* 21.9*  PLT 247 312 309     Recent Labs  11/02/15 0431 11/03/15 1022 11/04/15 0500  NA 140 140 136  K 3.5 3.2* 3.3*  CL 108 108 104  CO2 22 26 25   GLUCOSE 106* 111* 114*  BUN 12 10 8   CREATININE 0.94 0.72 0.61  CALCIUM 7.2* 7.3* 6.8*  MG  --  1.9 1.9  PHOS  --  1.4* 3.0  PROT  --   --  4.0*  ALBUMIN  --   --  1.5*  AST  --   --  13*  ALT  --   --  9*  ALKPHOS  --   --  42  BILITOT  --   --  0.4  PREALBUMIN  --   --  4.5*  TRIG  --   --  89   Estimated Creatinine Clearance: 89.8 mL/min (by C-G formula based on SCr of 0.8 mg/dL).    Recent Labs  11/03/15 2158 11/04/15 0037 11/04/15 0538  GLUCAP 128* 132* 121*   Insulin Requirements in the past 24 hours:  4 unit of sensitive SSI TIDWC + HS.   Current Nutrition:  NPO  Clinimix E 5/15 @ 40 ml/hr + 20% IV Lipid emulsion at 10 mL/hr (Providing 48g protein and 1162 kcal - meeting ~56% protein and 65% of kcal needs)  Assessment: 65 year old male POD # 5 partial distal gastrectomy and bilroth II reconstruction/gastrojejunostomy with possible leak seen on upper GI scan. Patient has been NPO for 6 days and  due to possible anastomotic leak will need to remain NPO and meets requirements for TPN. TPN to initiate 8/7 and surgery plans to repeat UGI in 48 hours.   Surgeries/Procedures: 06/2015 - sigmoid colectomy and colostomy s/p sigmoid diverticulits with perforation 10/30/15- partial distal gastrectomy with Billroth II reconstruction/gastrojejunostomy 11/02/15 - UGI scan found possible leak   GI: UGI scan yesterday significant for possible anastomotic leak and surgery plans for extended NPO status and initiating TPN. NG tube output 500 mL & colostomy output 550 mL yesterday. Famotidine in TPN. Albumin is low at 1.5. Prealbumin is 4.5. Plan for CT abdomen with contrast today to evaluate possible leak.   Endo: CBGs controlled (111-121) on sensitive SSI. A1c 5.9.   Lytes: K down to 3.3 despite replacement yesterday. Mg remains 1.9. Phos improved at 3. CoCa 8.8. Na trending down at 136 - still wnl. IVF contain potassium.   Renal: Hx CKD. SCr improved at 0.61. UOP not accurately recorded. Net + 9L. D5-1/2NS + 20K @ 75 mL/hr per MD.   Pulm: RA  Cards: BP ok, HR 90s - ST.   Hepatobil: LFTs and Tbili are wnl. TG wnl.  Neuro: Pain score 0-2/10.  Prn Dilaudid. Confused with fever on 8/7 -quickly resolved.   ID:   WBC up 13.9. Fever 8/7 - Tmax 102.1 yesterday. No antibiotics at this time.  Best Practices: Lovenox 40 sq daily TPN Access: PICC 8/7 >> TPN start date: 8/7 >>   Nutritional Goals: per RD goals 8/7 1800-2000 kCal,  85-100 grams of protein per day  Plan:  Increase Clinimix E 5/15 to 60 mL/hr.  Continue 20% IV lipid emulsion at 10 mL/hr.  This provides 72g protein and 1502 kcal- meeting 85% protein needs and 83% of kcal needs.  Change to Sensitive SSI every 6 hours while on TPN.  IV Fluids increased per MD to 75 ml/hr today due to contrast dye load -- plan to decrease tomorrow.  Add MVI and Trace Elements to TPN bag. Add Famotidine 40 mg to TPN..  Follow-up CT with contrast  today.   Give KCl 10 mEq IV x 4.  Give Mg 2g IV x1. Repeat BMET this PM to ensure potassium at goal - will replace further if not.  Follow-up BMET, Mg, and Phos in AM.   Sloan Leiter, PharmD, BCPS Clinical Pharmacist [phone] 11/04/2015,8:57 AM

## 2015-11-04 NOTE — Progress Notes (Signed)
[place] TEAM 1 - Stepdown/ICU TEAM  Miguel [name]  [id number] DOB: 1951-02-10 DOA: 10/27/2015 PCP: Donnie Coffin, MD    Brief Narrative:  65 y.o.M Hx GI bleed related to NSAID use, Gastritis, recent Sigmoid Diverticulitis with perforation S/P Hartmann's procedure, and Nephrolithiasis who presented to the ED c/o hematemesis and syncope.  Wife reported 2 episodes of vomiting large amounts bright red blood. EMS was called and found patient pale and diaphoretic but alert. He then had another episode of emesis bright red blood of 250cc. He denied abdominal pain but did endorse continuous NSAID use over the years. April 2017 he had a perforated sigmoid diverticulitis with surgical repair hartmann procedure and colostomy. He is scheduled to have colostomy reversed August 10.   Subjective: No new complaints.    Assessment & Plan:  Very large bleeding ulcer / Hiatal hernia / Hematemesis -Prior bleed related to NSAID overuse  -EGD 7/31 noted large HH and gastric ulcer with adherent clot - repeat EGDs with recurrent severe bleeding unable to address bleeding -Taken to the OR 8/3 for repair of bleeding ulcer with partial distal gastrectomy with Billroth II reconstruction and gastrojejunostomy -General Surgery following and directing this portion of his care - clinically stabilizing nicely - UGI done  Showed There was no leak seen during real-time imaging. However, on delayed images, linear high attenuation is seen in the medial right upper abdomen. While artifact is possible, the fact that the finding only appears on delayed images and not on the initial scout view is concerning for a possible leak. Recommend either a short-term follow-up upper GI or a CT scan for better evaluation.  Continue with NPO becauser of the above.  NG tube to low intermittent wall suction, repeat UGI in a few days.  PICC line put in and TPN started.  CT abd and pelvis ordered , showed pneumoperitoneum , and  moderate amount of fluid in the pelvis and right pericolic gutter, related to gastric surgery. There was no fluid accumulation of abscess at the site of anastomosis or the duodenal stump..  Further recommendations as per surgery.   Acute Blood loss anemia transfuse as required to keep hemoglobin at 7.0 or greater -hemoglobin around 7.  1 unit prbc transfusion will be ordered tonight.     Hypokalemia:  Replete as needed. Repeat level tomorrow  Hypotension Blood pressure reasonably controlled. No change in medications.   Hypophosphatemia: Replete and repeat in am.   Hyperglycemia  -A1c 5.9 - CBG not significantly elevated CBG (last 3)   Recent Labs  11/04/15 0037 11/04/15 0538 11/04/15 1216  GLUCAP 132* 121* 119*  resume SSI.     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 - not asking for any pain meds.  - discontinued the dilaudid PCA to  iv Dilaudid 1 to 2 mg IV every 4 hours.  - encouraged him to get out of bed and walk in the hallway.   Fever: blood cultures ordered.  CT abd and pelvis shows pneumoperitoneum and free fluid in the abdomen, but no abscess.  Moderate Bilateral effusions seen.   DVT prophylaxis: SCDs Code Status: FULL CODE Family Communication: multiple family members in the room, discussed the plan of care with the patient.  Disposition Plan: pending further recommendations by surgery.   Consultants:  [facility]   Procedures: 7/31 EGD 8/2 EGD 8/3 EGD 8/3 partial distal gastrectomy with Billroth II reconstruction and gastrojejunostomy  Antimicrobials:  none  Objective: Blood pressure (!) 142/81, pulse (!) 107, temperature (!) 101.8 F (38.8 C), temperature source Oral, resp. rate 12, height 5\' 5"  (1.651 m), weight 80.2 kg (176 lb 14.7 oz), SpO2 93 %.  Intake/Output Summary (Last 24 hours) at 11/04/15 1646 Last data filed at 11/04/15 1457  Gross per 24 hour  Intake           1412.67 ml  Output             1600 ml  Net          -187.33 ml   Filed Weights   10/31/15 1947 11/01/15 0604 11/04/15 0535  Weight: 80.7 kg (178 lb) 80.3 kg (177 lb 1.6 oz) 80.2 kg (176 lb 14.7 oz)    Examination: General: No acute respiratory distress with NG tube still in. Lungs: diminished at bases, without wheeze, no rhonchi.  Cardiovascular: Regular rate and rhythm without murmur gallop or rub normal S1 and S2 Abdomen: Nontender, nondistended, soft, bowel sounds positive - ostomy intact - midline upper abdom wound dressed and dry , black coloured liquid in the colostomy.  Extremities: No significant cyanosis, clubbing, edema bilateral lower extremities Neuro; alert and oriented. Able to walk in the hallway. No focal deficits.   CBC:  Recent Labs Lab 10/30/15 1636 10/31/15 0450 11/01/15 0321 11/02/15 0431 11/03/15 2049 11/04/15 0500  WBC 7.6 7.4 9.9 12.4* 13.6* 13.9*  NEUTROABS 6.6 6.3 8.0*  --   --   --   HGB 9.0* 8.4* 7.7* 8.3* 7.6* 7.2*  HCT 27.4* 24.7* 22.9* 25.4* 23.2* 21.9*  MCV 89.3 89.8 92.3 92.7 89.6 91.3  PLT 131* 146* 184 247 312 309     Basic Metabolic Panel:  Recent Labs Lab 10/31/15 0450 11/01/15 0321 11/02/15 0431 11/03/15 1022 11/04/15 0500  NA 140 138 140 140 136  K 3.7 3.4* 3.5 3.2* 3.3*  CL 114* 107 108 108 104  CO2 23 24 22 26 25   GLUCOSE 128* 110* 106* 111* 114*  BUN 20 13 12 10 8   CREATININE 0.96 0.97 0.94 0.72 0.61  CALCIUM 6.9* 7.2* 7.2* 7.3* 6.8*  MG  --   --   --  1.9 1.9  PHOS  --   --   --  1.4* 3.0   GFR: Estimated Creatinine Clearance: 89.8 mL/min (by C-G formula based on SCr of 0.8 mg/dL).  Liver Function Tests:  Recent Labs Lab 10/30/15 0329 11/04/15 0500  AST 10* 13*  ALT 8* 9*  ALKPHOS 29* 42  BILITOT 0.6 0.4  PROT 3.4* 4.0*  ALBUMIN 1.7* 1.5*    HbA1C: Hgb A1c MFr Bld  Date/Time Value Ref Range Status  10/27/2015 01:50 PM 5.9 (H) 4.8 - 5.6 % Final    Comment:    (NOTE)         Pre-diabetes: 5.7  - 6.4         Diabetes: >6.4         Glycemic control for adults with diabetes: <7.0     CBG:  Recent Labs Lab 11/03/15 1732 11/03/15 2158 11/04/15 0037 11/04/15 0538 11/04/15 1216  GLUCAP 126* 128* 132* 121* 119*    Recent Results (from the past 240 hour(s))  MRSA PCR Screening     Status: None   Collection Time: 10/27/15  5:28 PM  Result Value Ref Range Status   MRSA by PCR NEGATIVE NEGATIVE Final    Comment:        The GeneXpert MRSA Assay (FDA approved  for NASAL specimens only), is one component of a comprehensive MRSA colonization surveillance program. It is not intended to diagnose MRSA infection nor to guide or monitor treatment for MRSA infections.      Scheduled Meds: . enoxaparin (LOVENOX) injection  40 mg Subcutaneous Q24H  . insulin aspart  0-9 Units Subcutaneous Q6H  . prochlorperazine  10 mg Intravenous Once  . sodium chloride flush  3 mL Intravenous Q12H     LOS: 8 days    Hosie Poisson, MD [facility] Office  [phone] Pager - Text Page per Shea Evans as per below:  On-Call/Text Page:      [email or web address]      password [email or web address]  If 7PM-7AM, please contact night-coverage [email or web address] Password [facility] 11/04/2015, 4:46 PM

## 2015-11-04 NOTE — Progress Notes (Signed)
PT Cancellation Note  Patient Details Name: Miguel [name] MRN: [id number] DOB: [birth year]   Cancelled Treatment:    Reason Eval/Treat Not Completed: Other (comment).  Pt is at CT scan.  Had fever/confusion last night.  PT will check back later as time allows.  Thanks,    Barbarann Ehlers. [place], [place], DPT [phone]   11/04/2015, 11:20 AM

## 2015-11-04 NOTE — Progress Notes (Signed)
Physical Therapy Treatment Patient Details Name: Miguel [name] MRN: [id number] DOB: Aug 03, 1950 Today's Date: 11/04/2015    History of Present Illness Pt is a 65 y.o. male with medical history significant for gi bleed related to nsaid use, gastritis, and recent sigmoid diverticulitis with perforation s/p Hartmann's procedure.  He presented to the ED with hematemesis and syncope. Initial evaluation revealed acute blood loss anemia likely related to GI bleed in setting of NSAID use.    PT Comments    Despite fever, pt feeling much more up to walking today.  He was able to walk the entire length of the hallway with RW and min guard assist for safety.  LE HEP and flutter/IS used at end of session. Pt's family visiting and pt encouraged to go on walks on the days that PT is not here with staff or family.  PT will continue to follow acutely to progress gait.    Follow Up Recommendations  Home health PT;Supervision - Intermittent     Equipment Recommendations  Rolling walker with 5" wheels    Recommendations for Other Services   NA     Precautions / Restrictions Precautions Precautions: Fall;Other (comment) Precaution Comments: NG tube with continuous suction.    Mobility  Bed Mobility Overal bed mobility: Needs Assistance Bed Mobility: Rolling;Sidelying to Sit Rolling: Min guard Sidelying to sit: Min guard       General bed mobility comments: Verbal cues for log roll technique, min guard assit for safety during transitions.   Transfers Overall transfer level: Needs assistance Equipment used: Rolling walker (2 wheeled) Transfers: Sit to/from Stand Sit to Stand: Min guard         General transfer comment: Min guard assist for safety during transitions, lowered RW to match pt's height.   Ambulation/Gait Ambulation/Gait assistance: Min guard Ambulation Distance (Feet): 200 Feet Assistive device: Rolling walker (2 wheeled) Gait Pattern/deviations: Step-through pattern;Trunk  flexed Gait velocity: decreased Gait velocity interpretation: Below normal speed for age/gender General Gait Details: Pt needs cues to stand tall and gently strech that abdomen so that he doesn't scar down into that flexed posture.            Balance Overall balance assessment: Needs assistance Sitting-balance support: Feet supported;No upper extremity supported Sitting balance-Leahy Scale: Good     Standing balance support: Bilateral upper extremity supported;Single extremity supported;No upper extremity supported Standing balance-Leahy Scale: Fair                      Cognition Arousal/Alertness: Awake/alert Behavior During Therapy: WFL for tasks assessed/performed Overall Cognitive Status: Within Functional Limits for tasks assessed                      Exercises General Exercises - Lower Extremity Long Arc Quad: AROM;Both;10 reps;Seated Hip Flexion/Marching: AROM;Both;10 reps;Seated Toe Raises: AROM;Both;20 reps;Seated Heel Raises: AROM;Both;20 reps;Seated Other Exercises Other Exercises: flutter valve x 2 reps, IS x 5 reps 1700 mL max inspired volume    General Comments General comments (skin integrity, edema, etc.): Verbal cues for log roll and to brace with abdominal pillow when coughing.       Pertinent Vitals/Pain Pain Assessment: Faces Faces Pain Scale: Hurts little more Pain Location: abdomen Pain Descriptors / Indicators: Aching;Burning;Grimacing;Guarding Pain Intervention(s): Limited activity within patient's tolerance;Monitored during session;Repositioned           PT Goals (current goals can now be found in the care plan section) Acute Rehab PT Goals Patient Stated  Goal: home Progress towards PT goals: Progressing toward goals    Frequency  Min 3X/week    PT Plan Current plan remains appropriate       End of Session   Activity Tolerance: Patient tolerated treatment well Patient left: in chair;with call bell/phone within  reach;with family/visitor present     Time: [id number] PT Time Calculation (min) (ACUTE ONLY): 29 min  Charges:  $Gait Training: 23-37 mins                      Rolfe Hartsell B. [place], [place], DPT [phone]   11/04/2015, 5:11 PM

## 2015-11-04 NOTE — Progress Notes (Signed)
[facility] Progress Note  5 Days Post-Op  Subjective: Developed fever of 102.1 at 7:33 PM last night associated with feeling hot, followed by chills. Also reports being disoriented around that time - thought he was at home and was asking for his dog. Refused PT yesterday because he wasn't feeling well. Denies worsening abdominal pain. Describes his abdominal pain soreness, mostly located at the superior aspect of his incision site. Denies N/V.   Objective: Vital signs in last 24 hours: Temp:  [98 F (36.7 C)-102.1 F (38.9 C)] 99.9 F (37.7 C) (08/08 0535) Pulse Rate:  [81-98] 96 (08/08 0535) Resp:  [10-20] 10 (08/08 0535) BP: (125-144)/(72-86) 125/72 (08/08 0535) SpO2:  [95 %-97 %] 96 % (08/08 0535) Weight:  [80.2 kg (176 lb 14.7 oz)] 80.2 kg (176 lb 14.7 oz) (08/08 0535) Last BM Date: 11/04/15  Intake/Output from previous day: 08/07 0701 - 08/08 0700 In: 1058.3 [I.V.:1058.3] Out: 1150 [Urine:600; Emesis/NG output:500; Stool:50] Intake/Output this shift: No intake/output data recorded.  PE: Gen:  Alert, NAD, pleasant Card:  RRR, no M/G/R heard Pulm:  CTA, no W/R/R Abd: Soft, appropriately tender, mild distention, +BS, ostomy in place, stoma viable with dark liquid stool this AM. Ext: mild swelling, no erythema, tenderness, or pitting edema.   Lab Results:   Recent Labs  11/03/15 2049 11/04/15 0500  WBC 13.6* 13.9*  HGB 7.6* 7.2*  HCT 23.2* 21.9*  PLT 312 309   BMET  Recent Labs  11/03/15 1022 11/04/15 0500  NA 140 136  K 3.2* 3.3*  CL 108 104  CO2 26 25  GLUCOSE 111* 114*  BUN 10 8  CREATININE 0.72 0.61  CALCIUM 7.3* 6.8*   PT/INR No results for input(s): LABPROT, INR in the last 72 hours. CMP     Component Value Date/Time   NA 136 11/04/2015 0500   K 3.3 (L) 11/04/2015 0500   CL 104 11/04/2015 0500   CO2 25 11/04/2015 0500   GLUCOSE 114 (H) 11/04/2015 0500   BUN 8 11/04/2015 0500   CREATININE 0.61 11/04/2015 0500   CALCIUM 6.8  (L) 11/04/2015 0500   PROT 4.0 (L) 11/04/2015 0500   ALBUMIN 1.5 (L) 11/04/2015 0500   AST 13 (L) 11/04/2015 0500   ALT 9 (L) 11/04/2015 0500   ALKPHOS 42 11/04/2015 0500   BILITOT 0.4 11/04/2015 0500   GFRNONAA >60 11/04/2015 0500   GFRAA >60 11/04/2015 0500   Lipase  No results found for: LIPASE     Studies/Results: Dg Ugi  W/kub  Addendum Date: 11/02/2015   ADDENDUM REPORT: 11/02/2015 16:06 ADDENDUM: The findings were called to the patient's physician, Dr. Brantley Stage Electronically Signed   By: Dorise Bullion III M.D   On: 11/02/2015 16:06   Result Date: 11/02/2015 CLINICAL DATA:  Recent antrectomy with gastrojejunostomy. Evaluate for leak. EXAM: WATER SOLUBLE UPPER GI SERIES TECHNIQUE: Single-column upper GI series was performed using water soluble contrast. CONTRAST:  60 cc of Gastrografin COMPARISON:  None FLUOROSCOPY TIME:  If the device does not provide the exposure index: Fluoroscopy Time (in minutes and seconds):  2.3 minutes Number of Acquired Images: 15 saved images. No individual exposures. FINDINGS: On the initial scout view, no radiopaque material overlies the lower chest or upper abdomen. Skin staples were seen. An NG tube is seen in the left retrocardiac region, at the site of surgery. During real-time imaging, 60 cc of Gastrografin was injected. No leak was seen during real-time imaging. However, on delayed imaging, there is a linear  region of high attenuation, equal to the attenuation of contrast, in the medial right upper abdomen. The patient's clothing was removed from this region and the finding persisted on a repeat KUB. This finding was not seen on the original scout view. IMPRESSION: There was no leak seen during real-time imaging. However, on delayed images, linear high attenuation is seen in the medial right upper abdomen. While artifact is possible, the fact that the finding only appears on delayed images and not on the initial scout view is concerning for a possible  leak. Recommend either a short-term follow-up upper GI or a CT scan for better evaluation. The findings will be called to the referring clinician by myself. 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 Judeth Horn Sigmoid diverticulitis with perforation status post sigmoid colectomy and colostomy, 4/17 Dr. [name] - UGI study significant for linear high attenuation in right upper abdomen on delayed images - cannot exclude anastomotic leak.  - continue NGT to LIS  Leukocytosis - WBC up to 13.9 today from 12.4 yesterday. No signs/sxs of DVT, denies cough/SOB. Fever - 24h range: 99.9-102.1, Tylenol   ABL anemia stable; H&H 7.2 / 21.9   FEN: NPO/IVF, on TPN DVT: SCDs , Lovenox Pulm: continue pulm toilet  Plan: CT scan of abdomen/pelvis w/ IV contrast today to look for possible fluid collection around surgical anastomosis/duodenal stump. SCr 0.61. I have increased the pts fluids to 75 mL/hr to accommodate contrast dye load - can decrease tomorrow.    LOS: 8 days    [facility] 11/04/2015, 8:00 AM Pager: [phone] Consults: [phone] Mon-Fri 7:00 am-4:30 pm Sat-Sun 7:00 am-11:30 am

## 2015-11-05 ENCOUNTER — Inpatient Hospital Stay (HOSPITAL_COMMUNITY): Payer: 59

## 2015-11-05 LAB — CBC
HEMATOCRIT: 29.6 % — AB (ref 39.0–52.0)
Hemoglobin: 9.7 g/dL — ABNORMAL LOW (ref 13.0–17.0)
MCH: 29.2 pg (ref 26.0–34.0)
MCHC: 32.8 g/dL (ref 30.0–36.0)
MCV: 89.2 fL (ref 78.0–100.0)
Platelets: 409 10*3/uL — ABNORMAL HIGH (ref 150–400)
RBC: 3.32 MIL/uL — ABNORMAL LOW (ref 4.22–5.81)
RDW: 15.6 % — AB (ref 11.5–15.5)
WBC: 18 10*3/uL — AB (ref 4.0–10.5)

## 2015-11-05 LAB — BASIC METABOLIC PANEL
Anion gap: 8 (ref 5–15)
BUN: 8 mg/dL (ref 6–20)
CHLORIDE: 101 mmol/L (ref 101–111)
CO2: 25 mmol/L (ref 22–32)
Calcium: 7.3 mg/dL — ABNORMAL LOW (ref 8.9–10.3)
Creatinine, Ser: 0.73 mg/dL (ref 0.61–1.24)
GFR calc Af Amer: >60 mL/min (ref >60)
GFR calc non Af Amer: >60 mL/min (ref >60)
GLUCOSE: 130 mg/dL — AB (ref 65–99)
POTASSIUM: 4 mmol/L (ref 3.5–5.1)
Sodium: 134 mmol/L — ABNORMAL LOW (ref 135–145)

## 2015-11-05 LAB — URINE MICROSCOPIC-ADD ON

## 2015-11-05 LAB — TYPE AND SCREEN
ABO/RH(D): A POS
ANTIBODY SCREEN: NEGATIVE
Unit division: 0

## 2015-11-05 LAB — URINALYSIS, ROUTINE W REFLEX MICROSCOPIC
BILIRUBIN URINE: NEGATIVE
Glucose, UA: NEGATIVE mg/dL
Ketones, ur: NEGATIVE mg/dL
Leukocytes, UA: NEGATIVE
Nitrite: NEGATIVE
PROTEIN: NEGATIVE mg/dL
Specific Gravity, Urine: 1.011 (ref 1.005–1.030)
pH: 7.5 (ref 5.0–8.0)

## 2015-11-05 LAB — GLUCOSE, CAPILLARY
GLUCOSE-CAPILLARY: 120 mg/dL — AB (ref 65–99)
GLUCOSE-CAPILLARY: 120 mg/dL — AB (ref 65–99)
GLUCOSE-CAPILLARY: 101 mg/dL — AB (ref 65–99)
Glucose-Capillary: 127 mg/dL — ABNORMAL HIGH (ref 65–99)

## 2015-11-05 LAB — PHOSPHORUS: PHOSPHORUS: 2.4 mg/dL — AB (ref 2.5–4.6)

## 2015-11-05 LAB — MAGNESIUM: Magnesium: 2 mg/dL (ref 1.7–2.4)

## 2015-11-05 MED ORDER — POTASSIUM CHLORIDE IN NACL 20-0.45 MEQ/L-% IV SOLN
INTRAVENOUS | Status: AC
  Administered 2015-11-05: 12:00:00 via INTRAVENOUS
  Filled 2015-11-05 (×2): qty 1000

## 2015-11-05 MED ORDER — VANCOMYCIN HCL 10 G IV SOLR
1750.0000 mg | Freq: Once | INTRAVENOUS | Status: AC
  Administered 2015-11-05: 1750 mg via INTRAVENOUS
  Filled 2015-11-05: qty 1750

## 2015-11-05 MED ORDER — POTASSIUM CHLORIDE IN NACL 20-0.45 MEQ/L-% IV SOLN
INTRAVENOUS | Status: DC
  Administered 2015-11-06 – 2015-11-09 (×2): via INTRAVENOUS
  Filled 2015-11-05 (×4): qty 1000

## 2015-11-05 MED ORDER — PIPERACILLIN-TAZOBACTAM 3.375 G IVPB 30 MIN
3.3750 g | Freq: Once | INTRAVENOUS | Status: AC
  Administered 2015-11-05: 3.375 g via INTRAVENOUS
  Filled 2015-11-05: qty 50

## 2015-11-05 MED ORDER — PIPERACILLIN-TAZOBACTAM 3.375 G IVPB
3.3750 g | Freq: Three times a day (TID) | INTRAVENOUS | Status: DC
  Administered 2015-11-05 – 2015-11-09 (×12): 3.375 g via INTRAVENOUS
  Filled 2015-11-05 (×14): qty 50

## 2015-11-05 MED ORDER — SODIUM PHOSPHATES 45 MMOLE/15ML IV SOLN
15.0000 mmol | Freq: Once | INTRAVENOUS | Status: AC
  Administered 2015-11-05: 15 mmol via INTRAVENOUS
  Filled 2015-11-05 (×2): qty 5

## 2015-11-05 MED ORDER — FAT EMULSION 20 % IV EMUL
240.0000 mL | INTRAVENOUS | Status: AC
  Administered 2015-11-05: 240 mL via INTRAVENOUS
  Filled 2015-11-05: qty 250

## 2015-11-05 MED ORDER — VANCOMYCIN HCL 10 G IV SOLR
1250.0000 mg | Freq: Two times a day (BID) | INTRAVENOUS | Status: DC
  Administered 2015-11-06 – 2015-11-09 (×8): 1250 mg via INTRAVENOUS
  Filled 2015-11-05 (×10): qty 1250

## 2015-11-05 MED ORDER — TRACE MINERALS CR-CU-MN-SE-ZN 10-1000-500-60 MCG/ML IV SOLN
INTRAVENOUS | Status: AC
  Administered 2015-11-05: 18:00:00 via INTRAVENOUS
  Filled 2015-11-05: qty 1920

## 2015-11-05 NOTE — Progress Notes (Signed)
[facility] Progress Note  6 Days Post-Op  Subjective: Patient states he is feeling much better than yesterday - mild abdominal soreness over incision site. Denies chills, night sweats, nausea, CP, cough, SOB. Reports liquid stool in bag. Ambulated the halls yesterday. Swelling in legs is decreasing. Requests something to drink/eat.  Pulling 1600 on IS and using flutter valve. NGT - 500 cc bilious output in 24h  Objective:    Febrile 101.8 yesterday afternoon. Vital signs in last 24 hours:  Temp:  [98.2 F (36.8 C)-101.8 F (38.8 C)] 99.6 F (37.6 C) (08/09 0600) Pulse Rate:  [87-107] 103 (08/09 0425) Resp:  [12-18] 18 (08/09 0425) BP: (119-150)/(61-81) 147/71 (08/09 0425) SpO2:  [93 %-97 %] 94 % (08/09 0425) Last BM Date: 11/04/15  Intake/Output from previous day: 08/08 0701 - 08/09 0700 In: 3394.3 [I.V.:2554.3; Blood:670; NG/GT:20; IV Piggyback:150] Out: 2525 [Urine:1950; Emesis/NG output:500; Stool:75] Intake/Output this shift: No intake/output data recorded.  PE: Gen:  Alert, NAD, pleasant Card:  RRR, no M/G/R  Pulm:  CTA, no W/R/R Abd: Soft, appropriately tender ND, +BS, small amount of liquid stool in ostomy, no gas. Stoma pink and viable with stomal herniation.  Ext:  No erythema or tenderness, + pitting edema of BL LE   Lab Results:   Recent Labs  11/04/15 0500 11/05/15 0530  WBC 13.9* 18.0*  HGB 7.2* 9.7*  HCT 21.9* 29.6*  PLT 309 409*   BMET  Recent Labs  11/04/15 1806 11/05/15 0530  NA 134* 134*  K 3.7 4.0  CL 103 101  CO2 26 25  GLUCOSE 145* 130*  BUN 8 8  CREATININE 0.73 0.73  CALCIUM 6.9* 7.3*   PT/INR No results for input(s): LABPROT, INR in the last 72 hours. CMP     Component Value Date/Time   NA 134 (L) 11/05/2015 0530   K 4.0 11/05/2015 0530   CL 101 11/05/2015 0530   CO2 25 11/05/2015 0530   GLUCOSE 130 (H) 11/05/2015 0530   BUN 8 11/05/2015 0530   CREATININE 0.73 11/05/2015 0530   CALCIUM 7.3 (L) 11/05/2015  0530   PROT 4.0 (L) 11/04/2015 0500   ALBUMIN 1.5 (L) 11/04/2015 0500   AST 13 (L) 11/04/2015 0500   ALT 9 (L) 11/04/2015 0500   ALKPHOS 42 11/04/2015 0500   BILITOT 0.4 11/04/2015 0500   GFRNONAA >60 11/05/2015 0530   GFRAA >60 11/05/2015 0530   Lipase  No results found for: LIPASE     Studies/Results: Ct Abdomen Pelvis W Contrast  Result Date: 11/04/2015 CLINICAL DATA:  Generalized abdominal pain. EXAM: CT ABDOMEN AND PELVIS WITH CONTRAST TECHNIQUE: Multidetector CT imaging of the abdomen and pelvis was performed using the standard protocol following bolus administration of intravenous contrast. CONTRAST:  170mL ISOVUE-300 IOPAMIDOL (ISOVUE-300) INJECTION 61% COMPARISON:  CT scan of July 04, 2015. Barium examination of November 02, 2015. FINDINGS: Mild degenerative disc disease is noted at L1-2 and L5-S1. Moderate bilateral pleural effusions are noted with adjacent subsegmental atelectasis. No gallstones are noted. The liver, spleen and pancreas unremarkable. Adrenal glands appear normal. Stable right renal cyst is noted. Bilateral nephrolithiasis is noted. No hydronephrosis or renal obstruction is noted. Pneumoperitoneum is noted consistent with history of recent gastric surgery. Large hiatal hernia is noted as well, containing almost the entire stomach and potentially a small bowel loop. Moderate amount of free fluid is noted in the pelvis and right pericolic gutter. 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 Peristomal hernia is noted which contains small  bowel loops, but does not result in incarceration or obstruction. Mild ventral hernia is also noted in the midline. Urinary bladder appears normal. No significant adenopathy is noted. IMPRESSION: Moderate bilateral pleural effusions are noted with adjacent subsegmental atelectasis. Bilateral nonobstructive nephrolithiasis. Large hiatal hernia is again noted, containing almost the entire stomach and potentially a small bowel loop. Left lower  quadrant colostomy is noted, with peristomal hernia which contains small bowel loops, but does not result in incarceration or obstruction. Pneumoperitoneum is noted as well as a moderate amount of fluid in the pelvis and right pericolic gutter which most likely is related to recent gastric surgery. These results were called by telephone at the time of interpretation on 11/04/2015 at 12:51 pm to Dr. Karleen Hampshire, who verbally acknowledged these results. Electronically Signed   By: Marijo Conception, M.D.   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 Judeth Horn Sigmoid diverticulitis with perforation status post sigmoid colectomy and colostomy,4/17 Dr. [name] - UGI study significant for linear high attenuation in right upper abdomen on delayed images (11/02/15) but no evidence of leak on CT scan 8/8  Hiatal hernia, recurrent - surgical jejunal anastomosis and remaining stomach above diaphragm - no s/s of bowel obstruction  Leukocytosis - WBC up to 13.9 today from 12.4 yesterday. No signs/sxs of DVT, denies cough/SOB. Fever - 24h range: 99.9-102.1, Tylenol   ABL anemia stable  FEN: NPO/IVF, on TPN DVT: SCDs , Lovenox Pulm: continue pulm toilet w/ added flutter valve  Plan: NG output stable at 500cc including consumption of ice chips. Clinically improving without signs of obstruction. clamp NGT and start limited clear liquids (1500 mL PO daily) from floor only.   LOS: 9 days    Jill Alexanders , [facility] 11/05/2015, 8:44 AM Pager: [phone] Consults: [phone] Mon-Fri 7:00 am-4:30  pm Sat-Sun 7:00 am-11:30 am

## 2015-11-05 NOTE — Progress Notes (Addendum)
PARENTERAL NUTRITION CONSULT NOTE - FOLLOW UP  Pharmacy Consult:  TPN Indication:  S/p distal gastrectomy and Bilroth II procedure with possible leak  Allergies  Allergen Reactions  . No Known Allergies     Patient Measurements: Height: 5\' 5"  (165.1 cm) Weight: 176 lb 14.7 oz (80.2 kg) IBW/kg (Calculated) : 61.5 Adjusted Body Weight: 67.1 kg  Vital Signs: Temp: 99.6 F (37.6 C) (08/09 0600) Temp Source: Oral (08/09 0425) BP: 147/71 (08/09 0425) Pulse Rate: 103 (08/09 0425) Intake/Output from previous day: 08/08 0701 - 08/09 0700 In: 3394.3 [I.V.:2554.3; Blood:670; NG/GT:20; IV Piggyback:150] Out: 2525 [Urine:1950; Emesis/NG output:500; Stool:75] Intake/Output from this shift: Total I/O In: -  Out: 350 [Urine:350]  Labs:  Recent Labs  11/03/15 2049 11/04/15 0500 11/05/15 0530  WBC 13.6* 13.9* 18.0*  HGB 7.6* 7.2* 9.7*  HCT 23.2* 21.9* 29.6*  PLT 312 309 409*     Recent Labs  11/03/15 1022 11/04/15 0500 11/04/15 1806 11/05/15 0530  NA 140 136 134* 134*  K 3.2* 3.3* 3.7 4.0  CL 108 104 103 101  CO2 26 25 26 25   GLUCOSE 111* 114* 145* 130*  BUN 10 8 8 8   CREATININE 0.72 0.61 0.73 0.73  CALCIUM 7.3* 6.8* 6.9* 7.3*  MG 1.9 1.9  --  2.0  PHOS 1.4* 3.0  --  2.4*  PROT  --  4.0*  --   --   ALBUMIN  --  1.5*  --   --   AST  --  13*  --   --   ALT  --  9*  --   --   ALKPHOS  --  42  --   --   BILITOT  --  0.4  --   --   PREALBUMIN  --  4.5*  --   --   TRIG  --  89  --   --    Estimated Creatinine Clearance: 89.8 mL/min (by C-G formula based on SCr of 0.8 mg/dL).    Recent Labs  11/04/15 1750 11/04/15 2347 11/05/15 0538  GLUCAP 134* 127* 120*     Insulin Requirements in the past 24 hours:  3 units SSI  Assessment: 7 YOM with history of sigmoid diverticulitis with perforation post colectomy and colostomy in April 2017.  Now s/p partial distal gastrectomy and Bilroth II reconstruction/gastrojejunostomy on 10/30/15.  He has possible leak seen on  upper GI scan on 11/02/15.  Patient continues on TPN for nutritional support.  GI: prealbumin low at 4.5, NG O/P 553mL, colostomy O/P 132mL.  8/8 CT shows large hiatal hernia, negative for leak.  Doing better, ambulating, request food/drink Endo: no hx DM, A1c 5.9% - CBGs controlled Lytes: mild hyponatremia and hypophosphatemia, others WNL Renal: CT shows nonobstructive stones - SCr 0.73 stable, CrCL 90 ml/min - good UOP 1 ml/kg/hr, D51/2NS 20K at 75 ml/hr Cards: HLD - BP controlled, some tachy Hepatobil: LFTs / tbili / TG WNL Neuro: pain score 0-5, PRN Dilaudid ID: now afebrile, WBC elevated at 18 - not on abx Best Practices: Lovenox TPN Access: PICC 11/03/15 TPN start date: 11/03/15  Current Nutrition:  TPN Limited clear liquids (1500 mL/day)  Nutritional Goals:  1800-2000 kCal,  85-100 grams of protein per day   Plan:  - Increase Clinimix to 80 ml/hr and continue lipids at 10 ml/hr.  TPN will provide 1843 kCal and 96 gm of protein daily, meeting 100% of patient's needs. - Daily multivitamin and trace elements - Pepcid 40mg  daily in  TPN - Continue SSI Q6H - Change IVF from D51/2NS 20K to 1/2NS 20K, and reduce to 55 ml/hr once new TPN bag starts - NaPhos 15 mmol IV x 1 - Watch WBC, Na and IVF - F/U AM labs and tolerance of clear liquids   Kamaljit Hizer D. Mina Marble, PharmD, BCPS Pager:  [phone] 11/05/2015, 9:35 AM   ============================================   Addendum: - start vancomycin and Zosyn for post-op fever and leukocytosis - first doses of abx already ordered   Goal of Therapy: Vanc trough ~15 mcg/mL   Plan: - Vanc 1750mg  IV x 1 as ordered, then 1250mg  IV Q12H - Zosyn 3.375gm IV Q8H, 4 hr infusion - Monitor renal fxn, clinical progress, vanc trough as indicated   Nan Maya D. Mina Marble, PharmD, BCPS Pager:  [phone] 11/05/2015, 10:57 AM

## 2015-11-05 NOTE — Progress Notes (Signed)
[facility] PROGRESS NOTE  CINDY AUSTGEN [id number] DOB: 1950/07/21 DOA: 10/27/2015 PCP: Donnie Coffin, MD  Brief Narrative:  65 y.o.M Hx GIbleed related to NSAID use, Gastritis, recent Sigmoid Diverticulitis with perforation S/P Hartmann's procedure, and Nephrolithiasis who presented to the ED c/o hematemesis and syncope.  Wife reported 2 episodes of vomiting large amounts bright red blood. EMS was called and found patient pale and diaphoretic but alert. He then had another episode of emesis bright red blood of 250cc. He denied abdominal pain but did endorse continuous NSAID use over the years. April 2017 he had a perforated sigmoid diverticulitis with surgical repair hartmann procedure and colostomy. He is scheduled to have colostomy reversed August 10. Fever post-op with w/u pending.  Assessment/Plan: Very large bleeding ulcer / Hiatal hernia / Hematemesis -Prior bleed related to NSAID overuse  -EGD 7/31 noted large HH and gastric ulcer with adherent clot - repeat EGDs with recurrent severe bleeding unable to address bleeding -Taken to the OR 8/3 for repair of bleeding ulcer with partial distal gastrectomy with Billroth II reconstruction and gastrojejunostomy -General Surgery following and directing this portion of his care - clinically stabilizing nicely - UGI done  Showed There was no leak seen during real-time imaging. However, on delayed images, linear high attenuation is seen in the medial right upper abdomen. While artifact is possible, the fact that the finding only appears on delayed images and not on the initial scout view is concerning for a possible leak. Recommend either a short-term follow-up upper GI or a CT scan for better evaluation.  Continue with NPO becauser of the above.  NG tube to low intermittent wall suction, repeat UGI in a few days. Now clamped. PICC line put in and TPN started. Ongoing.  CT abd and pelvis ordered , showed pneumoperitoneum , and  moderate amount of fluid in the pelvis and right pericolic gutter, related to gastric surgery. There was no fluid accumulation of abscess at the site of anastomosis or the duodenal stump..  Further recommendations as per surgery.   Acute Blood loss anemia transfuse as required to keep hemoglobin at 7.0 or greater -hemoglobin around 7.  1 unit prbc transfusion 8/8. Good response.     Hypokalemia:  Replete as needed. Repeat level tomorrow  Hypotension Blood pressure reasonably controlled. No change in medications.   Hypophosphatemia: Replete and repeat in am.   Hyperglycemia  -A1c 5.9 - CBG not significantly elevated CBG (last 3)   Recent Labs (last 2 labs)    Recent Labs  11/04/15 0037 11/04/15 0538 11/04/15 1216  GLUCAP 132* 121* 119*    resume SSI.     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 - not asking for any pain meds.  - discontinued the dilaudid PCA to  iv Dilaudid 1 to 2 mg IV every 4 hours.  - encouraged him to get out of bed and walk in the hallway.   Post-Op Fever:  - blood cultures ordered.  - updated care team about need for post-op fever workup - checking UA & CXR - starting empiric abx Vanc/Zosyn CT abd and pelvis shows pneumoperitoneum and free fluid in the abdomen, but no abscess.  Moderate Bilateral effusions seen.   DVT prophylaxis: SCDs Code Status: FULL CODE Family Communication: multiple family members in the room, discussed the plan of care with the patient.  Disposition Plan: pending further recommendations by surgery.   Consultants:  [facility]  Procedures: 7/31 EGD 8/2 EGD 8/3 EGD 8/3 partial distal gastrectomy with Billroth II reconstruction and gastrojejunostomy  Antimicrobials:  none    HPI/Subjective: Pt c/o of fever. No N/V. Denies pain. States he is ambulatory.  Objective: Vitals:   11/05/15 0425 11/05/15 0600  BP: (!) 147/71    Pulse: (!) 103   Resp: 18   Temp: 100.3 F (37.9 C) 99.6 F (37.6 C)    Intake/Output Summary (Last 24 hours) at 11/05/15 1104 Last data filed at 11/05/15 0902  Gross per 24 hour  Intake          3394.33 ml  Output             2575 ml  Net           819.33 ml   Filed Weights   10/31/15 1947 11/01/15 0604 11/04/15 0535  Weight: 80.7 kg (178 lb) 80.3 kg (177 lb 1.6 oz) 80.2 kg (176 lb 14.7 oz)    Exam:  General:  No diaphoresis, anxious, no acute distress. Clamped NGt in place. Cardiovascular: Regular rate and rhythm no murmurs rubs or gallops Respiratory: Clear to auscultation bilaterally no more breathing Abdomen: Nondistended bowel sounds normal nontender palpation. Liquid stool in bad. Beefy red stoma. Midline dressing CDI. Musculoskeletal: Moving all extremities, no deformity, 5 out of 5 strength   Data Reviewed: Basic Metabolic Panel:  Recent Labs Lab 11/02/15 0431 11/03/15 1022 11/04/15 0500 11/04/15 1806 11/05/15 0530  NA 140 140 136 134* 134*  K 3.5 3.2* 3.3* 3.7 4.0  CL 108 108 104 103 101  CO2 22 26 25 26 25   GLUCOSE 106* 111* 114* 145* 130*  BUN 12 10 8 8 8   CREATININE 0.94 0.72 0.61 0.73 0.73  CALCIUM 7.2* 7.3* 6.8* 6.9* 7.3*  MG  --  1.9 1.9  --  2.0  PHOS  --  1.4* 3.0  --  2.4*   Liver Function Tests:  Recent Labs Lab 10/30/15 0329 11/04/15 0500  AST 10* 13*  ALT 8* 9*  ALKPHOS 29* 42  BILITOT 0.6 0.4  PROT 3.4* 4.0*  ALBUMIN 1.7* 1.5*   No results for input(s): LIPASE, AMYLASE in the last 168 hours. No results for input(s): AMMONIA in the last 168 hours. CBC:  Recent Labs Lab 10/30/15 1636 10/31/15 0450 11/01/15 0321 11/02/15 0431 11/03/15 2049 11/04/15 0500 11/05/15 0530  WBC 7.6 7.4 9.9 12.4* 13.6* 13.9* 18.0*  NEUTROABS 6.6 6.3 8.0*  --   --   --   --   HGB 9.0* 8.4* 7.7* 8.3* 7.6* 7.2* 9.7*  HCT 27.4* 24.7* 22.9* 25.4* 23.2* 21.9* 29.6*  MCV 89.3 89.8 92.3 92.7 89.6 91.3 89.2  PLT 131* 146* 184 247 312 309 409*    Cardiac Enzymes: No results for input(s): CKTOTAL, CKMB, CKMBINDEX, TROPONINI in the last 168 hours. BNP (last 3 results) No results for input(s): BNP in the last 8760 hours.  ProBNP (last 3 results) No results for input(s): PROBNP in the last 8760 hours.  CBG:  Recent Labs Lab 11/04/15 0538 11/04/15 1216 11/04/15 1750 11/04/15 2347 11/05/15 0538  GLUCAP 121* 119* 134* 127* 120*    Recent Results (from the past 240 hour(s))  MRSA PCR Screening     Status: None   Collection Time: 10/27/15  5:28 PM  Result Value Ref Range Status   MRSA by PCR NEGATIVE NEGATIVE Final    Comment:        The GeneXpert MRSA Assay (FDA approved for NASAL  specimens only), is one component of a comprehensive MRSA colonization surveillance program. It is not intended to diagnose MRSA infection nor to guide or monitor treatment for MRSA infections.      Studies: Ct Abdomen Pelvis W Contrast  Result Date: 11/04/2015 CLINICAL DATA:  Generalized abdominal pain. EXAM: CT ABDOMEN AND PELVIS WITH CONTRAST TECHNIQUE: Multidetector CT imaging of the abdomen and pelvis was performed using the standard protocol following bolus administration of intravenous contrast. CONTRAST:  167mL ISOVUE-300 IOPAMIDOL (ISOVUE-300) INJECTION 61% COMPARISON:  CT scan of July 04, 2015. Barium examination of November 02, 2015. FINDINGS: Mild degenerative disc disease is noted at L1-2 and L5-S1. Moderate bilateral pleural effusions are noted with adjacent subsegmental atelectasis. No gallstones are noted. The liver, spleen and pancreas unremarkable. Adrenal glands appear normal. Stable right renal cyst is noted. Bilateral nephrolithiasis is noted. No hydronephrosis or renal obstruction is noted. Pneumoperitoneum is noted consistent with history of recent gastric surgery. Large hiatal hernia is noted as well, containing almost the entire stomach and potentially a small bowel loop. Moderate amount of free fluid is noted in the pelvis  and right pericolic gutter. 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 Peristomal hernia is noted which contains small bowel loops, but does not result in incarceration or obstruction. Mild ventral hernia is also noted in the midline. Urinary bladder appears normal. No significant adenopathy is noted. IMPRESSION: Moderate bilateral pleural effusions are noted with adjacent subsegmental atelectasis. Bilateral nonobstructive nephrolithiasis. Large hiatal hernia is again noted, containing almost the entire stomach and potentially a small bowel loop. Left lower quadrant colostomy is noted, with peristomal hernia which contains small bowel loops, but does not result in incarceration or obstruction. Pneumoperitoneum is noted as well as a moderate amount of fluid in the pelvis and right pericolic gutter which most likely is related to recent gastric surgery. These results were called by telephone at the time of interpretation on 11/04/2015 at 12:51 pm to Dr. Karleen Hampshire, who verbally acknowledged these results. Electronically Signed   By: Marijo Conception, M.D.   On: 11/04/2015 12:54    Scheduled Meds: . enoxaparin (LOVENOX) injection  40 mg Subcutaneous Q24H  . insulin aspart  0-9 Units Subcutaneous Q6H  . piperacillin-tazobactam  3.375 g Intravenous Once  . piperacillin-tazobactam (ZOSYN)  IV  3.375 g Intravenous Q8H  . sodium chloride flush  3 mL Intravenous Q12H  . sodium phosphate  Dextrose 5% IVPB  15 mmol Intravenous Once  . [START ON 11/06/2015] vancomycin  1,250 mg Intravenous Q12H  . vancomycin  1,750 mg Intravenous Once   Continuous Infusions: . 0.45 % NaCl with KCl 20 mEq / L    . 0.45 % NaCl with KCl 20 mEq / L    . Marland KitchenTPN (CLINIMIX-E) Adult 60 mL/hr at 11/04/15 1722   And  . fat emulsion 240 mL (11/04/15 1722)  . Marland KitchenTPN (CLINIMIX-E) Adult     And  . fat emulsion      Principal Problem:   Acute blood loss anemia Active Problems:   Diverticulitis of colon with perforation   GI bleed   Syncope    Hyperglycemia   Hypotension   Neck pain   Hematemesis   Hematochezia   Left shoulder pain   Muscle spasm of left shoulder   Trapezius muscle spasm    Time spent: [facility] Pager see amion. If 7PM-7AM, please contact night-coverage at [email or web address], password [facility] 11/05/2015, 11:04 AM  LOS: 9  days

## 2015-11-06 LAB — URINALYSIS, ROUTINE W REFLEX MICROSCOPIC
Bilirubin Urine: NEGATIVE
GLUCOSE, UA: NEGATIVE mg/dL
Hgb urine dipstick: NEGATIVE
Ketones, ur: NEGATIVE mg/dL
LEUKOCYTES UA: NEGATIVE
Nitrite: NEGATIVE
PH: 6.5 (ref 5.0–8.0)
PROTEIN: NEGATIVE mg/dL
SPECIFIC GRAVITY, URINE: 1.017 (ref 1.005–1.030)

## 2015-11-06 LAB — COMPREHENSIVE METABOLIC PANEL
ALK PHOS: 54 U/L (ref 38–126)
ALT: 13 U/L — AB (ref 17–63)
AST: 16 U/L (ref 15–41)
Albumin: 1.7 g/dL — ABNORMAL LOW (ref 3.5–5.0)
Anion gap: 9 (ref 5–15)
BUN: 10 mg/dL (ref 6–20)
CALCIUM: 7.2 mg/dL — AB (ref 8.9–10.3)
CO2: 26 mmol/L (ref 22–32)
CREATININE: 0.83 mg/dL (ref 0.61–1.24)
Chloride: 100 mmol/L — ABNORMAL LOW (ref 101–111)
Glucose, Bld: 127 mg/dL — ABNORMAL HIGH (ref 65–99)
Potassium: 3.7 mmol/L (ref 3.5–5.1)
Sodium: 135 mmol/L (ref 135–145)
TOTAL PROTEIN: 4.8 g/dL — AB (ref 6.5–8.1)
Total Bilirubin: 0.6 mg/dL (ref 0.3–1.2)

## 2015-11-06 LAB — PHOSPHORUS: PHOSPHORUS: 4.2 mg/dL (ref 2.5–4.6)

## 2015-11-06 LAB — MAGNESIUM: MAGNESIUM: 2 mg/dL (ref 1.7–2.4)

## 2015-11-06 LAB — CBC WITH DIFFERENTIAL/PLATELET
BASOS PCT: 0 %
Basophils Absolute: 0 10*3/uL (ref 0.0–0.1)
EOS PCT: 4 %
Eosinophils Absolute: 0.6 10*3/uL (ref 0.0–0.7)
HEMATOCRIT: 25.5 % — AB (ref 39.0–52.0)
Hemoglobin: 8.1 g/dL — ABNORMAL LOW (ref 13.0–17.0)
Lymphocytes Relative: 6 %
Lymphs Abs: 0.9 10*3/uL (ref 0.7–4.0)
MCH: 28.6 pg (ref 26.0–34.0)
MCHC: 31.8 g/dL (ref 30.0–36.0)
MCV: 90.1 fL (ref 78.0–100.0)
MONO ABS: 0.9 10*3/uL (ref 0.1–1.0)
MONOS PCT: 7 %
NEUTROS ABS: 11.8 10*3/uL — AB (ref 1.7–7.7)
Neutrophils Relative %: 83 %
Platelets: 370 10*3/uL (ref 150–400)
RBC: 2.83 MIL/uL — ABNORMAL LOW (ref 4.22–5.81)
RDW: 15.7 % — AB (ref 11.5–15.5)
WBC: 14.2 10*3/uL — ABNORMAL HIGH (ref 4.0–10.5)

## 2015-11-06 LAB — PROTIME-INR
INR: 1.38
Prothrombin Time: 17 seconds — ABNORMAL HIGH (ref 11.4–15.2)

## 2015-11-06 LAB — GLUCOSE, CAPILLARY
Glucose-Capillary: 127 mg/dL — ABNORMAL HIGH (ref 65–99)
Glucose-Capillary: 128 mg/dL — ABNORMAL HIGH (ref 65–99)
Glucose-Capillary: 133 mg/dL — ABNORMAL HIGH (ref 65–99)

## 2015-11-06 LAB — PREALBUMIN: Prealbumin: 5.5 mg/dL — ABNORMAL LOW (ref 18–38)

## 2015-11-06 LAB — TRIGLYCERIDES: Triglycerides: 82 mg/dL (ref <150)

## 2015-11-06 MED ORDER — FAT EMULSION 20 % IV EMUL
240.0000 mL | INTRAVENOUS | Status: DC
  Administered 2015-11-06: 240 mL via INTRAVENOUS
  Filled 2015-11-06: qty 250

## 2015-11-06 MED ORDER — FAMOTIDINE 200 MG/20ML IV SOLN
INTRAVENOUS | Status: DC
Start: 2015-11-06 — End: 2015-11-07
  Administered 2015-11-06: 18:00:00 via INTRAVENOUS
  Filled 2015-11-06: qty 1920

## 2015-11-06 NOTE — Progress Notes (Signed)
Physical Therapy Treatment Patient Details Name: EDRIC BIERCE MRN: [id number] DOB: 03/26/1951 Today's Date: 11/06/2015    History of Present Illness Pt is a 65 y.o. male with medical history significant for gi bleed related to nsaid use, gastritis, and recent sigmoid diverticulitis with perforation s/p Hartmann's procedure.  He presented to the ED with hematemesis and syncope. Initial evaluation revealed acute blood loss anemia likely related to GI bleed in setting of NSAID use.    PT Comments    Pt is progressing well with mobility. He reports he has been walking several times a day-most recently using only IV pole for support. Pt requested to use RW this session due to having just received IV pain meds. Encouraged pt to continue ambulation. Do not anticipate he will have any follow up PT needs at discharge. He may not need RW-will continue to assess.    Follow Up Recommendations  No PT follow up;Supervision - Intermittent     Equipment Recommendations  Rolling walker with 5" wheels    Recommendations for Other Services       Precautions / Restrictions Precautions Precautions: Fall Precaution Comments: NG tube  Restrictions Weight Bearing Restrictions: No    Mobility  Bed Mobility               General bed mobility comments: pt oob when therapist entered room  Transfers Overall transfer level: Needs assistance   Transfers: Sit to/from Stand Sit to Stand: Supervision         General transfer comment: for safety  Ambulation/Gait Ambulation/Gait assistance: Min guard Ambulation Distance (Feet): 1000 Feet Assistive device: Rolling walker (2 wheeled) Gait Pattern/deviations: Step-through pattern     General Gait Details: used walker due to pt receiving IV pain meds not long before start of session. However, pt reports he has been walking while holding on to IV pole. close guard for safety.   Stairs            Wheelchair Mobility    Modified  Rankin (Stroke Patients Only)       Balance           Standing balance support: No upper extremity supported Standing balance-Leahy Scale: Fair                      Cognition Arousal/Alertness: Awake/alert Behavior During Therapy: WFL for tasks assessed/performed Overall Cognitive Status: Within Functional Limits for tasks assessed                      Exercises      General Comments        Pertinent Vitals/Pain Pain Assessment: Faces Faces Pain Scale: Hurts little more Pain Location: abdomen Pain Intervention(s): Premedicated before session;Monitored during session    Home Living                      Prior Function            PT Goals (current goals can now be found in the care plan section) Progress towards PT goals: Progressing toward goals    Frequency  Min 3X/week    PT Plan Discharge plan needs to be updated    Co-evaluation             End of Session   Activity Tolerance: Patient tolerated treatment well Patient left: in bed;with call bell/phone within reach;with family/visitor present     Time: 1105-1120 PT Time Calculation (min) (ACUTE ONLY):  15 min  Charges:  $Gait Training: 8-22 mins                    G Codes:      Weston Anna, MPT Pager: [phone]

## 2015-11-06 NOTE — Progress Notes (Signed)
[facility] PROGRESS NOTE  JILBERTO LABINE [id number] DOB: 01-16-1951 DOA: 10/27/2015 PCP: Donnie Coffin, MD  Brief Narrative:  65 y.o.M Hx GIbleed related to NSAID use, Gastritis, recent Sigmoid Diverticulitis with perforation S/P Hartmann's procedure, and Nephrolithiasis who presented to the ED c/o hematemesis and syncope.  Wife reported 2 episodes of vomiting large amounts bright red blood. EMS was called and found patient pale and diaphoretic but alert. He then had another episode of emesis bright red blood of 250cc. He denied abdominal pain but did endorse continuous NSAID use over the years. April 2017 he had a perforated sigmoid diverticulitis with surgical repair hartmann procedure and colostomy. He is scheduled to have colostomy reversed August 10. Fever post-op with w/u pending.  Assessment/Plan: Very large bleeding ulcer / Hiatal hernia / Hematemesis -Prior bleed related to NSAID overuse  -EGD 7/31 noted large HH and gastric ulcer with adherent clot - repeat EGDs with recurrent severe bleeding unable to address bleeding -Taken to the OR 8/3 for repair of bleeding ulcer with partial distal gastrectomy with Billroth II reconstruction and gastrojejunostomy -General Surgery following and directing this portion of his care - clinically stabilizing nicely - UGI done  Showed There was no leak seen during real-time imaging. However, on delayed images, linear high attenuation is seen in the medial right upper abdomen. While artifact is possible, the fact that the finding only appears on delayed images and not on the initial scout view is concerning for a possible leak. Recommend either a short-term follow-up upper GI or a CT scan for better evaluation.  Continue with NPO becauser of the above.  NG tube to low intermittent wall suction, repeat UGI in a few days. Planned removal for today, PICC line put in and TPN started. Ongoing.  CT abd and pelvis ordered , showed  pneumoperitoneum , and moderate amount of fluid in the pelvis and right pericolic gutter, related to gastric surgery. There was no fluid accumulation of abscess at the site of anastomosis or the duodenal stump..  Further recommendations as per surgery.   Acute Blood loss anemia transfuse as required to keep hemoglobin at 7.0 or greater -hemoglobin around 7.  1 unit prbc transfusion 8/8. Good response.     Hypokalemia:  Replete as needed. Repeat level tomorrow  Low Ca? Corrected Ca nl at 9.0. Check ionized in AM.  Hypotension Blood pressure reasonably controlled. No change in medications.   Hypophosphatemia: Replete and repeat if needed  Hyperglycemia  -A1c 5.9 - CBG not significantly elevated CBG (last 3)   Recent Labs (last 2 labs)    Recent Labs  11/04/15 0037 11/04/15 0538 11/04/15 1216  GLUCAP 132* 121* 119*    resume SSI.     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 - not asking for any pain meds.  - discontinued the dilaudid PCA to  iv Dilaudid 1 to 2 mg IV every 4 hours.  - encouraged him to get out of bed and walk in the hallway.   Post-Op Fever:  - blood cultures x2 NG 2 days - updated care team about need for post-op fever workup - checked UA neg  & CXR neg - Cont  empiric abx Vanc/Zosyn, pt with good clinical response CT abd and pelvis shows pneumoperitoneum and free fluid in the abdomen, but no abscess.  Moderate Bilateral effusions seen.   DVT prophylaxis: SCDs Code Status: FULL CODE Family Communication: multiple family members in the room, discussed  the plan of care with the patient.  Disposition Plan: pending further recommendations by surgery.   Consultants:  [facility]   Procedures: 7/31 EGD 8/2 EGD 8/3 EGD 8/3 partial distal gastrectomy with Billroth II reconstruction and gastrojejunostomy  Antimicrobials:  none    HPI/Subjective: Pt c/o of  fever. No N/V. Denies pain. States he is ambulatory.  Objective: Vitals:   11/06/15 1400 11/06/15 1825  BP: 120/78 130/79  Pulse: 85 89  Resp: 18 19  Temp: 100 F (37.8 C) 99.2 F (37.3 C)    Intake/Output Summary (Last 24 hours) at 11/06/15 1953 Last data filed at 11/06/15 1300  Gross per 24 hour  Intake          2161.58 ml  Output             1150 ml  Net          1011.58 ml   Filed Weights   10/31/15 1947 11/01/15 0604 11/04/15 0535  Weight: 80.7 kg (178 lb) 80.3 kg (177 lb 1.6 oz) 80.2 kg (176 lb 14.7 oz)    Exam:  General:  No diaphoresis, anxious, no acute distress. Clamped NGt in place. Cardiovascular: Regular rate and rhythm no murmurs rubs or gallops Respiratory: Clear to auscultation bilaterally no more breathing Abdomen: Nondistended bowel sounds normal nontender palpation. Liquid stool in bad. Beefy red stoma. Midline dressing CDI. Musculoskeletal: Moving all extremities, no deformity, 5 out of 5 strength   Data Reviewed: Basic Metabolic Panel:  Recent Labs Lab 11/03/15 1022 11/04/15 0500 11/04/15 1806 11/05/15 0530 11/06/15 0453  NA 140 136 134* 134* 135  K 3.2* 3.3* 3.7 4.0 3.7  CL 108 104 103 101 100*  CO2 26 25 26 25 26   GLUCOSE 111* 114* 145* 130* 127*  BUN 10 8 8 8 10   CREATININE 0.72 0.61 0.73 0.73 0.83  CALCIUM 7.3* 6.8* 6.9* 7.3* 7.2*  MG 1.9 1.9  --  2.0 2.0  PHOS 1.4* 3.0  --  2.4* 4.2   Liver Function Tests:  Recent Labs Lab 11/04/15 0500 11/06/15 0453  AST 13* 16  ALT 9* 13*  ALKPHOS 42 54  BILITOT 0.4 0.6  PROT 4.0* 4.8*  ALBUMIN 1.5* 1.7*   No results for input(s): LIPASE, AMYLASE in the last 168 hours. No results for input(s): AMMONIA in the last 168 hours. CBC:  Recent Labs Lab 10/31/15 0450 11/01/15 0321 11/02/15 0431 11/03/15 2049 11/04/15 0500 11/05/15 0530 11/06/15 0453  WBC 7.4 9.9 12.4* 13.6* 13.9* 18.0* 14.2*  NEUTROABS 6.3 8.0*  --   --   --   --  11.8*  HGB 8.4* 7.7* 8.3* 7.6* 7.2* 9.7* 8.1*  HCT  24.7* 22.9* 25.4* 23.2* 21.9* 29.6* 25.5*  MCV 89.8 92.3 92.7 89.6 91.3 89.2 90.1  PLT 146* 184 247 312 309 409* 370   Cardiac Enzymes: No results for input(s): CKTOTAL, CKMB, CKMBINDEX, TROPONINI in the last 168 hours. BNP (last 3 results) No results for input(s): BNP in the last 8760 hours.  ProBNP (last 3 results) No results for input(s): PROBNP in the last 8760 hours.  CBG:  Recent Labs Lab 11/05/15 1317 11/05/15 1711 11/06/15 0040 11/06/15 0555 11/06/15 1229  GLUCAP 120* 101* 133* 128* 127*    Recent Results (from the past 240 hour(s))  Culture, blood (Routine X 2) w Reflex to ID Panel     Status: None (Preliminary result)   Collection Time: 11/04/15  5:05 PM  Result Value Ref Range Status  Specimen Description BLOOD LEFT HAND  Final   Special Requests BOTTLES DRAWN AEROBIC AND ANAEROBIC 5CC  Final   Culture NO GROWTH 2 DAYS  Final   Report Status PENDING  Incomplete  Culture, blood (Routine X 2) w Reflex to ID Panel     Status: None (Preliminary result)   Collection Time: 11/04/15  6:06 PM  Result Value Ref Range Status   Specimen Description BLOOD LEFT ANTECUBITAL  Final   Special Requests BOTTLES DRAWN AEROBIC ONLY 10CC  Final   Culture NO GROWTH 2 DAYS  Final   Report Status PENDING  Incomplete     Studies: Dg Chest 2 View  Result Date: 11/05/2015 CLINICAL DATA:  Fever. EXAM: CHEST  2 VIEW COMPARISON:  Radiograph of October 30, 2015. FINDINGS: The heart size and mediastinal contours are within normal limits. Large hiatal hernia is again noted. Nasogastric tube tip remains within hiatal hernia. Mildly increased bilateral lung opacities are noted concerning for atelectasis and associated pleural effusions. No pneumothorax is noted. Interval placement of right-sided PICC line with distal tip in expected position of the SVC. Pneumoperitoneum is noted under right hemidiaphragm consistent with recent surgery. The visualized skeletal structures are unremarkable.  IMPRESSION: Mildly increased bibasilar atelectasis with associated pleural effusions is noted. Interval placement of right-sided PICC line with distal tip in expected position of the SVC. Stable large hiatal hernia. Electronically Signed   By: Marijo Conception, M.D.   On: 11/05/2015 12:20    Scheduled Meds: . piperacillin-tazobactam (ZOSYN)  IV  3.375 g Intravenous Q8H  . sodium chloride flush  3 mL Intravenous Q12H  . vancomycin  1,250 mg Intravenous Q12H   Continuous Infusions: . 0.45 % NaCl with KCl 20 mEq / L 55 mL/hr at 11/06/15 1807  . Marland KitchenTPN (CLINIMIX-E) Adult 80 mL/hr at 11/06/15 1748   And  . fat emulsion 240 mL (11/06/15 1750)    Principal Problem:   Acute blood loss anemia Active Problems:   Diverticulitis of colon with perforation   GI bleed   Syncope   Hyperglycemia   Hypotension   Neck pain   Hematemesis   Hematochezia   Left shoulder pain   Muscle spasm of left shoulder   Trapezius muscle spasm    Time spent: [place] [facility] Pager see amion. If 7PM-7AM, please contact night-coverage at [email or web address], password [facility] 11/06/2015, 7:53 PM  LOS: 10 days

## 2015-11-06 NOTE — Progress Notes (Signed)
Nutrition Follow-up  DOCUMENTATION CODES:   Not applicable  INTERVENTION:   -TPN management per pharmacy -RD will follow for diet advancement and supplement as appropriate  NUTRITION DIAGNOSIS:   Inadequate oral intake related to altered GI function as evidenced by other (see comment) (clear liquid diet).  Onging  GOAL:   Patient will meet greater than or equal to 90% of their needs  Met with TPN  MONITOR:   PO intake, Supplement acceptance, Diet advancement, I & O's  REASON FOR ASSESSMENT:   Consult New TPN/TNA  ASSESSMENT:   65 y.o. male with a Past Medical History of CKD, gastritis, HLD, diverticulosis w/ perforation,  who presents with acute GI bleed in need of EGD.  S/p Procedure(s) on 10/30/15: REPAIR OF BLEEDING ULCER PARTIAL DISTAL GASTRECTOMY WITH BILROTH II RECONSTRUCTION/GASTROJEJUNOSTOMY  Pt ambulating hallways at time of visit.   Case discussed with RN. She reports NGT has been clamped and pt was just transitioned from liquids from floor to a clear liquid diet. Pt is tolerating liquids well so far.   Reviewed pharmacy note. Pt receiving Clinimix E 5/15 at 80 ml/hr and lipids at 10 ml/hr.  TPN provides 1843 kCal and 96 gm of protein daily, meeting 100% of patient's needs.  Labs reviewed: CBGS: 127-133.   Diet Order:  TPN (CLINIMIX-E) Adult TPN (CLINIMIX-E) Adult Diet clear liquid Room service appropriate? Yes; Fluid consistency: Thin  Skin:  Reviewed, no issues  Last BM:  11/05/15  Height:   Ht Readings from Last 1 Encounters:  10/31/15 _0  (1.651 m)    Weight:   Wt Readings from Last 1 Encounters:  11/04/15 176 lb 14.7 oz (80.2 kg)    Ideal Body Weight:  61.8 kg  BMI:  Body mass index is 29.44 kg/m.  Estimated Nutritional Needs:   Kcal:  1800-2000  Protein:  85-100 gm  Fluid:  1.8-2 L  EDUCATION NEEDS:   No education needs identified at this time  Yakov Bergen A. Jimmye Norman, RD, LDN, CDE Pager: [phone] After hours Pager:  [phone]

## 2015-11-06 NOTE — Progress Notes (Signed)
PARENTERAL NUTRITION CONSULT NOTE - FOLLOW UP  Pharmacy Consult:  TPN Indication:  S/p distal gastrectomy and Bilroth II procedure with possible leak  Allergies  Allergen Reactions  . No Known Allergies     Patient Measurements: Height: 5\' 5"  (165.1 cm) Weight: 176 lb 14.7 oz (80.2 kg) IBW/kg (Calculated) : 61.5 Adjusted Body Weight: 67.1 kg  Vital Signs: Temp: 98.7 F (37.1 C) (08/10 0544) Temp Source: Oral (08/10 0544) BP: 133/77 (08/10 0544) Pulse Rate: 91 (08/10 0544) Intake/Output from previous day: 08/09 0701 - 08/10 0700 In: 4312.8 [P.O.:280; I.V.:2977.8; IV Piggyback:1055] Out: [id number] [Urine:3150; Stool:125] Intake/Output from this shift: No intake/output data recorded.  Labs:  Recent Labs  11/04/15 0500 11/05/15 0530 11/06/15 0453  WBC 13.9* 18.0* 14.2*  HGB 7.2* 9.7* 8.1*  HCT 21.9* 29.6* 25.5*  PLT 309 409* 370  INR  --   --  1.38     Recent Labs  11/04/15 0500 11/04/15 1806 11/05/15 0530 11/06/15 0453  NA 136 134* 134* 135  K 3.3* 3.7 4.0 3.7  CL 104 103 101 100*  CO2 25 26 25 26   GLUCOSE 114* 145* 130* 127*  BUN 8 8 8 10   CREATININE 0.61 0.73 0.73 0.83  CALCIUM 6.8* 6.9* 7.3* 7.2*  MG 1.9  --  2.0 2.0  PHOS 3.0  --  2.4* 4.2  PROT 4.0*  --   --  4.8*  ALBUMIN 1.5*  --   --  1.7*  AST 13*  --   --  16  ALT 9*  --   --  13*  ALKPHOS 42  --   --  54  BILITOT 0.4  --   --  0.6  PREALBUMIN 4.5*  --   --  5.5*  TRIG 89  --   --  82   Estimated Creatinine Clearance: 86.6 mL/min (by C-G formula based on SCr of 0.83 mg/dL).    Recent Labs  11/05/15 1711 11/06/15 0040 11/06/15 0555  GLUCAP 101* 133* 128*     Insulin Requirements in the past 24 hours:  1 unit SSI  Assessment: 89 YOM with history of sigmoid diverticulitis with perforation post colectomy and colostomy in April 2017.  Now s/p partial distal gastrectomy and Bilroth II reconstruction/gastrojejunostomy on 10/30/15.  He has possible leak seen on upper GI scan on 11/02/15.   Patient continues on TPN for nutritional support.  GI: prealbumin 4.5 > 5.5, NGT clamped, colostomy O/P 169mL.  8/8 CT shows large hiatal hernia, negative for leak.  Doing better, ambulating, request food/drink  Endo: no hx DM, A1c 5.9% - CBGs well controlled with minimal SSI use Lytes: mild hypochloremia, others WNL Renal: CT shows nonobstructive stones - SCr 0.83 stable, CrCL 87 ml/min, BUN 10 - good UOP 1.6 ml/kg/hr, net +11L since admit, 1/2NS 20K at 55 ml/hr Cards: HLD - BP controlled, some tachy Hepatobil: LFTs / tbili / TG WNL Neuro: pain score 0-4, PRN Dilaudid ID: Vanc/Zosyn D#2 (8/9 >> ) for post-op fever/leukocytosis - Tmax 100.5, WBC improved to 14.2, BCx NGTD Best Practices: Lovenox TPN Access: PICC 11/03/15 TPN start date: 11/03/15  Current Nutrition:  TPN Limited clear liquids (1500 mL/day)  Nutritional Goals:  1800-2000 kCal,  85-100 grams of protein per day   Plan:  - Continue Clinimix E 5/15 at 80 ml/hr and lipids at 10 ml/hr.  TPN provides 1843 kCal and 96 gm of protein daily, meeting 100% of patient's needs. - Daily multivitamin and trace elements - Pepcid 40mg   daily in TPN - D/C SSI/CBG checks - 1/2NS 20K at 55 ml/hr per MD - F/U tolerance to clear and diet advancement - Vanc 1250mg  IV Q12H - Zosyn 3.375gm IV Q8H, 4 hr infusion - Monitor renal fxn, clinical progress, vanc trough as indicated    Mitsy Owen D. Mina Marble, PharmD, BCPS Pager:  [phone] 11/06/2015, 8:46 AM

## 2015-11-06 NOTE — Progress Notes (Signed)
[facility] Office:  [phone] General Surgery Progress Note   LOS: 10 days  POD -  7 Days Post-Op  Assessment/Plan: 1.  REPAIR OF BLEEDING  ULCER, REMOVAL OF DISTAL STOMACH - 10/30/2015 - [facility]  For bleeding gastric ulcer secondary to NSAID  WBC - 14,200 - 11/06/2015  Zosyn and Vanc  Still has NGT - but doing okay  1A.  Superficial wound infection   Upper 1/2 of wound opened up - I removed all staples  Will start dressing changes.   2.  Hiatal hernia with anastomosis/remaining stomach in [facility]  Seen on CT scan on 11/04/2015 3.  LLQ colostomy with peristomal hernia  Sigmoid diverticulitis with perforation status post sigmoid colectomy and colostomy,07/14/2015 - Dr. [name] 4.  Anemia  Hgb - 8.1 - 11/06/2015 5.  DVT prophylaxis - on no chemoprophylaxis because of bleed   Principal Problem:   Acute blood loss anemia Active Problems:   Diverticulitis of colon with perforation   GI bleed   Syncope   Hyperglycemia   Hypotension   Neck pain   Hematemesis   Hematochezia   Left shoulder pain   Muscle spasm of left shoulder   Trapezius muscle spasm  Subjective:  Taking sips from floor, which he is tolerating. Daughter, Gaynelle Cage, in room with patient.  Objective:   Vitals:   11/05/15 2142 11/06/15 0544  BP: 121/83 133/77  Pulse: (!) 108 91  Resp: 18 18  Temp: (!) 100.5 F (38.1 C) 98.7 F (37.1 C)     Intake/Output from previous day:  08/09 0701 - 08/10 0700 In: 4312.8 [P.O.:280; I.V.:2977.8; IV Piggyback:1055] Out: [id number] [Urine:3150; Stool:125]  Intake/Output this shift:  No intake/output data recorded.   Physical Exam:   General: WN Wm who is alert and oriented.    HEENT: Normal. Pupils equal. .   Lungs: Clear   Abdomen: Ostomy in LLQ.  Has BS.  But tender.   Wound: Has purulence in upper part of wound. I removed staples and opened the upper 1/2 of wound.   Lab Results:    Recent Labs  11/05/15 0530 11/06/15 0453  WBC 18.0* 14.2*   HGB 9.7* 8.1*  HCT 29.6* 25.5*  PLT 409* 370    BMET   Recent Labs  11/05/15 0530 11/06/15 0453  NA 134* 135  K 4.0 3.7  CL 101 100*  CO2 25 26  GLUCOSE 130* 127*  BUN 8 10  CREATININE 0.73 0.83  CALCIUM 7.3* 7.2*    PT/INR   Recent Labs  11/06/15 0453  LABPROT 17.0*  INR 1.38    ABG  No results for input(s): PHART, HCO3 in the last 72 hours.  Invalid input(s): PCO2, PO2   Studies/Results:  Dg Chest 2 View  Result Date: 11/05/2015 CLINICAL DATA:  Fever. EXAM: CHEST  2 VIEW COMPARISON:  Radiograph of October 30, 2015. FINDINGS: The heart size and mediastinal contours are within normal limits. Large hiatal hernia is again noted. Nasogastric tube tip remains within hiatal hernia. Mildly increased bilateral lung opacities are noted concerning for atelectasis and associated pleural effusions. No pneumothorax is noted. Interval placement of right-sided PICC line with distal tip in expected position of the SVC. Pneumoperitoneum is noted under right hemidiaphragm consistent with recent surgery. The visualized skeletal structures are unremarkable. IMPRESSION: Mildly increased bibasilar atelectasis with associated pleural effusions is noted. Interval placement of right-sided PICC line with distal tip in expected position of the SVC. Stable large hiatal hernia. Electronically Signed  By: Marijo Conception, M.D.   On: 11/05/2015 12:20   Ct Abdomen Pelvis W Contrast  Result Date: 11/04/2015 CLINICAL DATA:  Generalized abdominal pain. EXAM: CT ABDOMEN AND PELVIS WITH CONTRAST TECHNIQUE: Multidetector CT imaging of the abdomen and pelvis was performed using the standard protocol following bolus administration of intravenous contrast. CONTRAST:  174mL ISOVUE-300 IOPAMIDOL (ISOVUE-300) INJECTION 61% COMPARISON:  CT scan of July 04, 2015. Barium examination of November 02, 2015. FINDINGS: Mild degenerative disc disease is noted at L1-2 and L5-S1. Moderate bilateral pleural effusions are noted with  adjacent subsegmental atelectasis. No gallstones are noted. The liver, spleen and pancreas unremarkable. Adrenal glands appear normal. Stable right renal cyst is noted. Bilateral nephrolithiasis is noted. No hydronephrosis or renal obstruction is noted. Pneumoperitoneum is noted consistent with history of recent gastric surgery. Large hiatal hernia is noted as well, containing almost the entire stomach and potentially a small bowel loop. Moderate amount of free fluid is noted in the pelvis and right pericolic gutter. 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 Peristomal hernia is noted which contains small bowel loops, but does not result in incarceration or obstruction. Mild ventral hernia is also noted in the midline. Urinary bladder appears normal. No significant adenopathy is noted. IMPRESSION: Moderate bilateral pleural effusions are noted with adjacent subsegmental atelectasis. Bilateral nonobstructive nephrolithiasis. Large hiatal hernia is again noted, containing almost the entire stomach and potentially a small bowel loop. Left lower quadrant colostomy is noted, with peristomal hernia which contains small bowel loops, but does not result in incarceration or obstruction. Pneumoperitoneum is noted as well as a moderate amount of fluid in the pelvis and right pericolic gutter which most likely is related to recent gastric surgery. These results were called by telephone at the time of interpretation on 11/04/2015 at 12:51 pm to Dr. Karleen Hampshire, who verbally acknowledged these results. Electronically Signed   By: Marijo Conception, M.D.   On: 11/04/2015 12:54     Anti-infectives:   Anti-infectives    Start     Dose/Rate Route Frequency Ordered Stop   11/06/15 0000  vancomycin (VANCOCIN) 1,250 mg in sodium chloride 0.9 % 250 mL IVPB     1,250 mg 166.7 mL/hr over 90 Minutes Intravenous Every 12 hours 11/05/15 1100     11/05/15 2100  piperacillin-tazobactam (ZOSYN) IVPB 3.375 g     3.375 g 12.5 mL/hr over 240  Minutes Intravenous Every 8 hours 11/05/15 1100     11/05/15 1045  piperacillin-tazobactam (ZOSYN) IVPB 3.375 g     3.375 g 100 mL/hr over 30 Minutes Intravenous  Once 11/05/15 1032 11/05/15 1243   11/05/15 1045  vancomycin (VANCOCIN) 1,750 mg in sodium chloride 0.9 % 500 mL IVPB     1,750 mg 250 mL/hr over 120 Minutes Intravenous  Once 11/05/15 1032 11/05/15 1619   10/30/15 2000  ceFAZolin (ANCEF) IVPB 1 g/50 mL premix     1 g 100 mL/hr over 30 Minutes Intravenous Every 8 hours 10/30/15 1605 10/31/15 1218      Alphonsa Overall, MD, FACS Pager: [facility] Office: [phone] 11/06/2015

## 2015-11-07 DIAGNOSIS — M7989 Other specified soft tissue disorders: Secondary | ICD-10-CM

## 2015-11-07 DIAGNOSIS — R509 Fever, unspecified: Secondary | ICD-10-CM

## 2015-11-07 LAB — BASIC METABOLIC PANEL
Anion gap: 12 (ref 5–15)
BUN: 10 mg/dL (ref 6–20)
CALCIUM: 8 mg/dL — AB (ref 8.9–10.3)
CHLORIDE: 98 mmol/L — AB (ref 101–111)
CO2: 22 mmol/L (ref 22–32)
Creatinine, Ser: 0.98 mg/dL (ref 0.61–1.24)
Glucose, Bld: 141 mg/dL — ABNORMAL HIGH (ref 65–99)
POTASSIUM: 4.2 mmol/L (ref 3.5–5.1)
SODIUM: 132 mmol/L — AB (ref 135–145)

## 2015-11-07 LAB — CBC WITH DIFFERENTIAL/PLATELET
BASOS ABS: 0 10*3/uL (ref 0.0–0.1)
BASOS PCT: 0 %
EOS ABS: 0.8 10*3/uL — AB (ref 0.0–0.7)
EOS PCT: 6 %
HCT: 27.4 % — ABNORMAL LOW (ref 39.0–52.0)
Hemoglobin: 8.6 g/dL — ABNORMAL LOW (ref 13.0–17.0)
Lymphocytes Relative: 6 %
Lymphs Abs: 0.8 10*3/uL (ref 0.7–4.0)
MCH: 28.9 pg (ref 26.0–34.0)
MCHC: 31.4 g/dL (ref 30.0–36.0)
MCV: 91.9 fL (ref 78.0–100.0)
MONO ABS: 0.8 10*3/uL (ref 0.1–1.0)
MONOS PCT: 6 %
Neutro Abs: 10.9 10*3/uL — ABNORMAL HIGH (ref 1.7–7.7)
Neutrophils Relative %: 82 %
PLATELETS: 447 10*3/uL — AB (ref 150–400)
RBC: 2.98 MIL/uL — ABNORMAL LOW (ref 4.22–5.81)
RDW: 15.9 % — AB (ref 11.5–15.5)
WBC: 13.3 10*3/uL — ABNORMAL HIGH (ref 4.0–10.5)

## 2015-11-07 MED ORDER — DEXTROSE 10 % IV SOLN
INTRAVENOUS | Status: DC
  Administered 2015-11-07 – 2015-11-08 (×2): via INTRAVENOUS

## 2015-11-07 MED ORDER — OXYCODONE-ACETAMINOPHEN 5-325 MG PO TABS: 1.0000 | ORAL_TABLET | ORAL | Status: DC | PRN

## 2015-11-07 MED ORDER — FAMOTIDINE 20 MG PO TABS
20.0000 mg | ORAL_TABLET | Freq: Two times a day (BID) | ORAL | Status: DC
  Administered 2015-11-07 – 2015-11-09 (×5): 20 mg via ORAL
  Filled 2015-11-07 (×5): qty 1

## 2015-11-07 NOTE — Progress Notes (Signed)
Pt's PICC line got accidentally pulled out. Pt stated that he turned and noticed that his IV was leaking. By the time this RN assessed pt, PICC was already all the way out. Pt a/o x3. Denies any discomfort or pain on the PICC site.  Dressing applied to the site, clean and dry. Notified K.Kirby NP and IV team. Placed peripheral IV x2. D10 has been ordered and running at 1ml/hr. Will continue to monitor pt.

## 2015-11-07 NOTE — Progress Notes (Signed)
[facility] PROGRESS NOTE  Miguel [name] [id number] DOB: 15-Jul-1950 DOA: 10/27/2015 PCP: Donnie Coffin, MD  Brief Narrative:  65 y.o.M Hx GIbleed related to NSAID use, Gastritis, recent Sigmoid Diverticulitis with perforation S/P Hartmann's procedure, and Nephrolithiasis who presented to the ED c/o hematemesis and syncope.  Wife reported 2 episodes of vomiting large amounts bright red blood. EMS was called and found patient pale and diaphoretic but alert. He then had another episode of emesis bright red blood of 250cc. He denied abdominal pain but did endorse continuous NSAID use over the years. April 2017 he had a perforated sigmoid diverticulitis with surgical repair hartmann procedure and colostomy. He is scheduled to have colostomy reversed August 10. Fever post-op with w/wound infection. Culture sent.  Assessment/Plan: Very large bleeding ulcer / Hiatal hernia / Hematemesis -Prior bleed related to NSAID overuse  -EGD 7/31 noted large HH and gastric ulcer with adherent clot - repeat EGDs with recurrent severe bleeding unable to address bleeding -Taken to the OR 8/3 for repair of bleeding ulcer with partial distal gastrectomy with Billroth II reconstruction and gastrojejunostomy -General Surgery following and directing this portion of his care - clinically stabilizing nicely - UGI done  Showed There was no leak seen during real-time imaging. However, on delayed images, linear high attenuation is seen in the medial right upper abdomen. While artifact is possible, the fact that the finding only appears on delayed images and not on the initial scout view is concerning for a possible leak. Recommend either a short-term follow-up upper GI or a CT scan for better evaluation.  Continue with NPO becauser of the above.  NG tube to low intermittent wall suction, repeat UGI in a few days. Planned removal for today, PICC line put in and TPN started. Ongoing.  CT abd and pelvis ordered ,  showed pneumoperitoneum , and moderate amount of fluid in the pelvis and right pericolic gutter, related to gastric surgery. There was no fluid accumulation of abscess at the site of anastomosis or the duodenal stump..  Further recommendations as per surgery.   Post-Op Fever 2/2 wound infection:  - blood cultures x2 NG 2 days - updated care team about need for post-op infxn tx - checked UA neg  & CXR neg - Cont  empiric abx Vanc/Zosyn, pt with good clinical response again today - Aerobic cult sent - Dr. Lucia Gaskins opened up surgical site at bedisde 8/10. Pus found. Pt now on q2 dressing changes. - will consider CT with IV contrast in future CT abd and pelvis shows pneumoperitoneum and free fluid in the abdomen, but no abscess.  Moderate Bilateral effusions seen.   New LE Edema - since post ope will check LE doppler  Acute Blood loss anemia transfuse as required to keep hemoglobin at 7.0 or greater.  1 unit prbc transfusion 8/8. Good response.   - remains stable  Hypokalemia:  Replete as needed. Repeat level tomorrow  Low Ca? Corrected Ca nl at 9.0. Checked ionized Ca --> pending  Hypotension Blood pressure reasonably controlled. No change in medications.   Hypophosphatemia: Replete and repeat if needed  Hyperglycemia  -A1c 5.9 - CBG not significantly elevated CBG (last 3)   Recent Labs (last 2 labs)    Recent Labs  11/04/15 0037 11/04/15 0538 11/04/15 1216  GLUCAP 132* 121* 119*    resume SSI.     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 - not asking for any pain meds.  -  discontinued the dilaudid PCA to  iv Dilaudid 1 to 2 mg IV every 4 hours.  - encouraged him to get out of bed and walk in the hallway.    DVT prophylaxis: SCDs Code Status: FULL CODE Family Communication: multiple family members in the room, discussed the plan of care with the patient.  Disposition Plan: pending further  recommendations by surgery.   Consultants:  [facility]   Procedures: 7/31 EGD 8/2 EGD 8/3 EGD 8/3 partial distal gastrectomy with Billroth II reconstruction and gastrojejunostomy  Antimicrobials:  none    HPI/Subjective: Pt c/o of fever. No N/V. C/o scrotal swelling. States he is ambulatory.  Objective: Vitals:   11/06/15 2121 11/07/15 0350  BP: 127/65 119/61  Pulse: (!) 103 (!) 102  Resp: 18 17  Temp: (!) 100.6 F (38.1 C) 99.4 F (37.4 C)    Intake/Output Summary (Last 24 hours) at 11/07/15 1134 Last data filed at 11/07/15 0530  Gross per 24 hour  Intake          2444.59 ml  Output             1450 ml  Net           994.59 ml   Filed Weights   11/01/15 0604 11/04/15 0535 11/07/15 0500  Weight: 80.3 kg (177 lb 1.6 oz) 80.2 kg (176 lb 14.7 oz) 79.9 kg (176 lb 2.4 oz)    Exam:  General:  No diaphoresis, anxious, no acute distress. Clamped NGt in place. Cardiovascular: Regular rate and rhythm no murmurs rubs or gallops Respiratory: Clear to auscultation bilaterally no more breathing Abdomen: Nondistended bowel sounds normal nontender palpation. Liquid stool in bad. Beefy red stoma. Midline dressing CDI. Musculoskeletal: Moving all extremities, no deformity, 5 out of 5 strength. 2+ nonpitting edema bil GU: scrotal swelling  Data Reviewed: Basic Metabolic Panel:  Recent Labs Lab 11/03/15 1022 11/04/15 0500 11/04/15 1806 11/05/15 0530 11/06/15 0453 11/07/15 0939  NA 140 136 134* 134* 135 132*  K 3.2* 3.3* 3.7 4.0 3.7 4.2  CL 108 104 103 101 100* 98*  CO2 26 25 26 25 26 22   GLUCOSE 111* 114* 145* 130* 127* 141*  BUN 10 8 8 8 10 10   CREATININE 0.72 0.61 0.73 0.73 0.83 0.98  CALCIUM 7.3* 6.8* 6.9* 7.3* 7.2* 8.0*  MG 1.9 1.9  --  2.0 2.0  --   PHOS 1.4* 3.0  --  2.4* 4.2  --    Liver Function Tests:  Recent Labs Lab 11/04/15 0500 11/06/15 0453  AST 13* 16  ALT 9* 13*  ALKPHOS 42 54  BILITOT 0.4 0.6  PROT 4.0* 4.8*  ALBUMIN 1.5*  1.7*   No results for input(s): LIPASE, AMYLASE in the last 168 hours. No results for input(s): AMMONIA in the last 168 hours. CBC:  Recent Labs Lab 11/01/15 0321  11/03/15 2049 11/04/15 0500 11/05/15 0530 11/06/15 0453 11/07/15 0939  WBC 9.9  < > 13.6* 13.9* 18.0* 14.2* 13.3*  NEUTROABS 8.0*  --   --   --   --  11.8* 10.9*  HGB 7.7*  < > 7.6* 7.2* 9.7* 8.1* 8.6*  HCT 22.9*  < > 23.2* 21.9* 29.6* 25.5* 27.4*  MCV 92.3  < > 89.6 91.3 89.2 90.1 91.9  PLT 184  < > 312 309 409* 370 447*  < > = values in this interval not displayed. Cardiac Enzymes: No results for input(s): CKTOTAL, CKMB, CKMBINDEX, TROPONINI in the last 168 hours.  BNP (last 3 results) No results for input(s): BNP in the last 8760 hours.  ProBNP (last 3 results) No results for input(s): PROBNP in the last 8760 hours.  CBG:  Recent Labs Lab 11/05/15 1317 11/05/15 1711 11/06/15 0040 11/06/15 0555 11/06/15 1229  GLUCAP 120* 101* 133* 128* 127*    Recent Results (from the past 240 hour(s))  Culture, blood (Routine X 2) w Reflex to ID Panel     Status: None (Preliminary result)   Collection Time: 11/04/15  5:05 PM  Result Value Ref Range Status   Specimen Description BLOOD LEFT HAND  Final   Special Requests BOTTLES DRAWN AEROBIC AND ANAEROBIC 5CC  Final   Culture NO GROWTH 2 DAYS  Final   Report Status PENDING  Incomplete  Culture, blood (Routine X 2) w Reflex to ID Panel     Status: None (Preliminary result)   Collection Time: 11/04/15  6:06 PM  Result Value Ref Range Status   Specimen Description BLOOD LEFT ANTECUBITAL  Final   Special Requests BOTTLES DRAWN AEROBIC ONLY 10CC  Final   Culture NO GROWTH 2 DAYS  Final   Report Status PENDING  Incomplete     Studies: Dg Chest 2 View  Result Date: 11/05/2015 CLINICAL DATA:  Fever. EXAM: CHEST  2 VIEW COMPARISON:  Radiograph of October 30, 2015. FINDINGS: The heart size and mediastinal contours are within normal limits. Large hiatal hernia is again  noted. Nasogastric tube tip remains within hiatal hernia. Mildly increased bilateral lung opacities are noted concerning for atelectasis and associated pleural effusions. No pneumothorax is noted. Interval placement of right-sided PICC line with distal tip in expected position of the SVC. Pneumoperitoneum is noted under right hemidiaphragm consistent with recent surgery. The visualized skeletal structures are unremarkable. IMPRESSION: Mildly increased bibasilar atelectasis with associated pleural effusions is noted. Interval placement of right-sided PICC line with distal tip in expected position of the SVC. Stable large hiatal hernia. Electronically Signed   By: Marijo Conception, M.D.   On: 11/05/2015 12:20    Scheduled Meds: . famotidine  20 mg Oral BID  . piperacillin-tazobactam (ZOSYN)  IV  3.375 g Intravenous Q8H  . sodium chloride flush  3 mL Intravenous Q12H  . vancomycin  1,250 mg Intravenous Q12H   Continuous Infusions: . 0.45 % NaCl with KCl 20 mEq / L 55 mL/hr at 11/06/15 1807  . dextrose 50 mL/hr at 11/07/15 0530    Principal Problem:   Acute blood loss anemia Active Problems:   Diverticulitis of colon with perforation   GI bleed   Syncope   Hyperglycemia   Hypotension   Neck pain   Hematemesis   Hematochezia   Left shoulder pain   Muscle spasm of left shoulder   Trapezius muscle spasm    Time spent: [facility] Pager see amion. If 7PM-7AM, please contact night-coverage at [email or web address], password [facility] 11/07/2015, 11:34 AM  LOS: 11 days

## 2015-11-07 NOTE — Progress Notes (Signed)
8 Days Post-Op  Subjective: Tolerated clears with NG clamped.  Objective: Vital signs in last 24 hours: Temp:  [99.2 F (37.3 C)-100.6 F (38.1 C)] 99.4 F (37.4 C) (08/11 0350) Pulse Rate:  [85-103] 102 (08/11 0350) Resp:  [17-19] 17 (08/11 0350) BP: (119-130)/(61-79) 119/61 (08/11 0350) SpO2:  [95 %-99 %] 98 % (08/11 0350) Weight:  [79.9 kg (176 lb 2.4 oz)] 79.9 kg (176 lb 2.4 oz) (08/11 0500) Last BM Date: 11/06/15  Intake/Output from previous day: 08/10 0701 - 08/11 0700 In: 2444.6 [P.O.:240; I.V.:1854.6; IV Piggyback:350] Out: [id number] [Urine:1150; Stool:400] Intake/Output this shift: No intake/output data recorded.  Abdomen soft, minimally tender  Lab Results:   Recent Labs  11/05/15 0530 11/06/15 0453  WBC 18.0* 14.2*  HGB 9.7* 8.1*  HCT 29.6* 25.5*  PLT 409* 370   BMET  Recent Labs  11/05/15 0530 11/06/15 0453  NA 134* 135  K 4.0 3.7  CL 101 100*  CO2 25 26  GLUCOSE 130* 127*  BUN 8 10  CREATININE 0.73 0.83  CALCIUM 7.3* 7.2*   PT/INR  Recent Labs  11/06/15 0453  LABPROT 17.0*  INR 1.38   ABG No results for input(s): PHART, HCO3 in the last 72 hours.  Invalid input(s): PCO2, PO2  Studies/Results: Dg Chest 2 View  Result Date: 11/05/2015 CLINICAL DATA:  Fever. EXAM: CHEST  2 VIEW COMPARISON:  Radiograph of October 30, 2015. FINDINGS: The heart size and mediastinal contours are within normal limits. Large hiatal hernia is again noted. Nasogastric tube tip remains within hiatal hernia. Mildly increased bilateral lung opacities are noted concerning for atelectasis and associated pleural effusions. No pneumothorax is noted. Interval placement of right-sided PICC line with distal tip in expected position of the SVC. Pneumoperitoneum is noted under right hemidiaphragm consistent with recent surgery. The visualized skeletal structures are unremarkable. IMPRESSION: Mildly increased bibasilar atelectasis with associated pleural effusions is noted. Interval  placement of right-sided PICC line with distal tip in expected position of the SVC. Stable large hiatal hernia. Electronically Signed   By: Marijo Conception, M.D.   On: 11/05/2015 12:20    Anti-infectives: Anti-infectives    Start     Dose/Rate Route Frequency Ordered Stop   11/06/15 0000  vancomycin (VANCOCIN) 1,250 mg in sodium chloride 0.9 % 250 mL IVPB     1,250 mg 166.7 mL/hr over 90 Minutes Intravenous Every 12 hours 11/05/15 1100     11/05/15 2100  piperacillin-tazobactam (ZOSYN) IVPB 3.375 g     3.375 g 12.5 mL/hr over 240 Minutes Intravenous Every 8 hours 11/05/15 1100     11/05/15 1045  piperacillin-tazobactam (ZOSYN) IVPB 3.375 g     3.375 g 100 mL/hr over 30 Minutes Intravenous  Once 11/05/15 1032 11/05/15 1243   11/05/15 1045  vancomycin (VANCOCIN) 1,750 mg in sodium chloride 0.9 % 500 mL IVPB     1,750 mg 250 mL/hr over 120 Minutes Intravenous  Once 11/05/15 1032 11/05/15 1619   10/30/15 2000  ceFAZolin (ANCEF) IVPB 1 g/50 mL premix     1 g 100 mL/hr over 30 Minutes Intravenous Every 8 hours 10/30/15 1605 10/31/15 1218      Assessment/Plan: s/p Procedure(s): REPAIR OF BLEEDING  ULCER (N/A) REMOVAL OF DISTAL STOMACH  D/c NG  LOS: 11 days    Miguel [name] A 11/07/2015

## 2015-11-07 NOTE — Progress Notes (Signed)
Ordered to remove NG tube. NG tube removed from patient.

## 2015-11-07 NOTE — Progress Notes (Signed)
PARENTERAL NUTRITION CONSULT NOTE - FOLLOW UP  Pharmacy Consult:  TPN Indication:  S/p distal gastrectomy and Bilroth II procedure with possible leak  Allergies  Allergen Reactions  . No Known Allergies     Patient Measurements: Height: 5\' 5"  (165.1 cm) Weight: 176 lb 2.4 oz (79.9 kg) IBW/kg (Calculated) : 61.5 Adjusted Body Weight: 67.1 kg  Vital Signs: Temp: 99.4 F (37.4 C) (08/11 0350) Temp Source: Oral (08/11 0350) BP: 119/61 (08/11 0350) Pulse Rate: 102 (08/11 0350) Intake/Output from previous day: 08/10 0701 - 08/11 0700 In: 2444.6 [P.O.:240; I.V.:1854.6; IV Piggyback:350] Out: [id number] [Urine:1150; Stool:400] Intake/Output from this shift: No intake/output data recorded.  Labs:  Recent Labs  11/05/15 0530 11/06/15 0453  WBC 18.0* 14.2*  HGB 9.7* 8.1*  HCT 29.6* 25.5*  PLT 409* 370  INR  --  1.38     Recent Labs  11/04/15 1806 11/05/15 0530 11/06/15 0453  NA 134* 134* 135  K 3.7 4.0 3.7  CL 103 101 100*  CO2 26 25 26   GLUCOSE 145* 130* 127*  BUN 8 8 10   CREATININE 0.73 0.73 0.83  CALCIUM 6.9* 7.3* 7.2*  MG  --  2.0 2.0  PHOS  --  2.4* 4.2  PROT  --   --  4.8*  ALBUMIN  --   --  1.7*  AST  --   --  16  ALT  --   --  13*  ALKPHOS  --   --  54  BILITOT  --   --  0.6  PREALBUMIN  --   --  5.5*  TRIG  --   --  82   Estimated Creatinine Clearance: 86.5 mL/min (by C-G formula based on SCr of 0.83 mg/dL).    Recent Labs  11/06/15 0040 11/06/15 0555 11/06/15 1229  GLUCAP 133* 128* 127*     Insulin Requirements in the past 24 hours:  SSI d/c'ed 8/10  Assessment: 31 YOM with history of sigmoid diverticulitis with perforation post colectomy and colostomy in April 2017.  Now s/p partial distal gastrectomy and Bilroth II reconstruction/gastrojejunostomy on 10/30/15.  He has possible leak seen on upper GI scan on 11/02/15.  Patient continues on TPN for nutritional support.  Aware patient's PICC was accidentally pulled out.  TPN was stopped and D10W  started at 50 ml/hr this AM.  GI: prealbumin 4.5 > 5.5, NGT removed 8/11, colostomy O/P 445mL.  8/8 CT shows large hiatal hernia, negative for leak.  Doing better, ambulating, request food/drink  Endo: no hx DM, A1c 5.9% - CBGs well controlled with minimal SSI use Lytes: 8/10 labs - mild hypochloremia, others WNL Renal: CT shows nonobstructive stones - SCr 0.83 stable, CrCL 87 ml/min, BUN 10 - good UOP 0.6 ml/kg/hr, net +11L since admit, 1/2NS 20K at 55 ml/hr Cards: HLD - BP controlled, tachy Hepatobil: LFTs / tbili / TG WNL Neuro: pain score 0-4, PRN Dilaudid ID: Vanc/Zosyn D#3 (8/9 >> ) for post-op fever/leukocytosis - Tmax 100.6, WBC improved to 14.2, BCx NGTD Best Practices: Lovenox TPN Access: PICC 11/03/15 >> removed 11/07/15 TPN start date: 11/03/15  Current Nutrition:  TPN (D10W at 50 ml/hr given PICC removal) Clear liquid diet  Nutritional Goals:  1800-2000 kCal,  85-100 grams of protein per day   Plan:  - Spoke to Dr. Rush Farmer, d/c TPN given patient's progression and plan to discharge soon. - Pepcid 20mg  PO BID - IVF per MD - D/C TPN labs and orders   Aliscia Clayton D. Mina Marble, PharmD,  BCPS Pager:  [phone] 11/07/2015, 8:10 AM

## 2015-11-08 ENCOUNTER — Inpatient Hospital Stay (HOSPITAL_COMMUNITY): Payer: 59

## 2015-11-08 DIAGNOSIS — D62 Acute posthemorrhagic anemia: Secondary | ICD-10-CM

## 2015-11-08 LAB — CBC
HCT: 25 % — ABNORMAL LOW (ref 39.0–52.0)
Hemoglobin: 8 g/dL — ABNORMAL LOW (ref 13.0–17.0)
MCH: 28.9 pg (ref 26.0–34.0)
MCHC: 32 g/dL (ref 30.0–36.0)
MCV: 90.3 fL (ref 78.0–100.0)
PLATELETS: 482 10*3/uL — AB (ref 150–400)
RBC: 2.77 MIL/uL — AB (ref 4.22–5.81)
RDW: 15.6 % — AB (ref 11.5–15.5)
WBC: 9.8 10*3/uL (ref 4.0–10.5)

## 2015-11-08 LAB — CALCIUM, IONIZED: Calcium, Ionized, Serum: 4.6 mg/dL (ref 4.5–5.6)

## 2015-11-08 LAB — BASIC METABOLIC PANEL
Anion gap: 8 (ref 5–15)
BUN: 8 mg/dL (ref 6–20)
CHLORIDE: 100 mmol/L — AB (ref 101–111)
CO2: 28 mmol/L (ref 22–32)
CREATININE: 0.99 mg/dL (ref 0.61–1.24)
Calcium: 7.7 mg/dL — ABNORMAL LOW (ref 8.9–10.3)
GFR calc Af Amer: >60 mL/min (ref >60)
GFR calc non Af Amer: >60 mL/min (ref >60)
Glucose, Bld: 114 mg/dL — ABNORMAL HIGH (ref 65–99)
Potassium: 4.6 mmol/L (ref 3.5–5.1)
Sodium: 136 mmol/L (ref 135–145)

## 2015-11-08 LAB — CBC WITH DIFFERENTIAL/PLATELET
BASOS PCT: 0 %
Basophils Absolute: 0 10*3/uL (ref 0.0–0.1)
Eosinophils Absolute: 0.7 10*3/uL (ref 0.0–0.7)
Eosinophils Relative: 7 %
HEMATOCRIT: 23.6 % — AB (ref 39.0–52.0)
HEMOGLOBIN: 7.3 g/dL — AB (ref 13.0–17.0)
LYMPHS PCT: 9 %
Lymphs Abs: 0.9 10*3/uL (ref 0.7–4.0)
MCH: 28.4 pg (ref 26.0–34.0)
MCHC: 30.9 g/dL (ref 30.0–36.0)
MCV: 91.8 fL (ref 78.0–100.0)
MONOS PCT: 11 %
Monocytes Absolute: 1 10*3/uL (ref 0.1–1.0)
NEUTROS ABS: 6.9 10*3/uL (ref 1.7–7.7)
Neutrophils Relative %: 73 %
Platelets: 461 10*3/uL — ABNORMAL HIGH (ref 150–400)
RBC: 2.57 MIL/uL — ABNORMAL LOW (ref 4.22–5.81)
RDW: 15.9 % — ABNORMAL HIGH (ref 11.5–15.5)
WBC: 9.5 10*3/uL (ref 4.0–10.5)

## 2015-11-08 NOTE — Progress Notes (Signed)
*  Preliminary Results* Bilateral lower extremity venous duplex completed. Bilateral lower extremities are negative for deep vein thrombosis. There is no evidence of Baker's cyst bilaterally.  11/08/2015 10:52 AM Maudry Mayhew, BS, RVT, RDCS, RDMS

## 2015-11-08 NOTE — Progress Notes (Signed)
[facility] Progress Note  9 Days Post-Op  Subjective: Sitting up, just finished breakfast. Denies abdominal pain. Tolerating clears without N/V. Urinating without hesitancy. +liquid stool and gas in ostomy. Ambulating. Denies CP, SOB, weakness, dizziness, or near-syncope.   Objective: Vital signs in last 24 hours: Temp:  [99 F (37.2 C)-99.7 F (37.6 C)] 99 F (37.2 C) (08/12 0550) Pulse Rate:  [82-89] 83 (08/12 0550) Resp:  [18-19] 19 (08/12 0550) BP: (115-121)/(59-70) 121/59 (08/12 0550) SpO2:  [96 %-98 %] 96 % (08/12 0550) Weight:  [77.8 kg (171 lb 9.6 oz)] 77.8 kg (171 lb 9.6 oz) (08/12 0550) Last BM Date:  (ostomy)  Intake/Output from previous day: 08/11 0701 - 08/12 0700 In: 2106.7 [P.O.:480; I.V.:1051.7; IV Piggyback:575] Out: 1625 [Urine:825; Stool:800] Intake/Output this shift: Total I/O In: -  Out: 750 [Urine:750]  PE: Gen:  Alert, NAD, pleasant Card:  RRR, no M/G/R  Pulm:  CTA, no W/R/R Abd: Soft, NT/ND, +BS, midline incision C/D/I with wet-to-dry dressing in place - wound clean with no surround erythema. Ext:  No erythema or tenderness, some swelling in BL LE - negative homan's sign; no pain with passing/active ROM. Pulses in tact.   Lab Results:   Recent Labs  11/07/15 0939 11/08/15 0507  WBC 13.3* 9.5  HGB 8.6* 7.3*  HCT 27.4* 23.6*  PLT 447* 461*   BMET  Recent Labs  11/07/15 0939 11/08/15 0507  NA 132* 136  K 4.2 4.6  CL 98* 100*  CO2 22 28  GLUCOSE 141* 114*  BUN 10 8  CREATININE 0.98 0.99  CALCIUM 8.0* 7.7*   PT/INR  Recent Labs  11/06/15 0453  LABPROT 17.0*  INR 1.38   CMP     Component Value Date/Time   NA 136 11/08/2015 0507   K 4.6 11/08/2015 0507   CL 100 (L) 11/08/2015 0507   CO2 28 11/08/2015 0507   GLUCOSE 114 (H) 11/08/2015 0507   BUN 8 11/08/2015 0507   CREATININE 0.99 11/08/2015 0507   CALCIUM 7.7 (L) 11/08/2015 0507   PROT 4.8 (L) 11/06/2015 0453   ALBUMIN 1.7 (L) 11/06/2015 0453   AST 16  11/06/2015 0453   ALT 13 (L) 11/06/2015 0453   ALKPHOS 54 11/06/2015 0453   BILITOT 0.6 11/06/2015 0453   GFRNONAA >60 11/08/2015 0507   GFRAA >60 11/08/2015 0507   Lipase  No results found for: LIPASE     Studies/Results: No results found.  Anti-infectives: Anti-infectives    Start     Dose/Rate Route Frequency Ordered Stop   11/06/15 0000  vancomycin (VANCOCIN) 1,250 mg in sodium chloride 0.9 % 250 mL IVPB     1,250 mg 166.7 mL/hr over 90 Minutes Intravenous Every 12 hours 11/05/15 1100     11/05/15 2100  piperacillin-tazobactam (ZOSYN) IVPB 3.375 g     3.375 g 12.5 mL/hr over 240 Minutes Intravenous Every 8 hours 11/05/15 1100     11/05/15 1045  piperacillin-tazobactam (ZOSYN) IVPB 3.375 g     3.375 g 100 mL/hr over 30 Minutes Intravenous  Once 11/05/15 1032 11/05/15 1243   11/05/15 1045  vancomycin (VANCOCIN) 1,750 mg in sodium chloride 0.9 % 500 mL IVPB     1,750 mg 250 mL/hr over 120 Minutes Intravenous  Once 11/05/15 1032 11/05/15 1619   10/30/15 2000  ceFAZolin (ANCEF) IVPB 1 g/50 mL premix     1 g 100 mL/hr over 30 Minutes Intravenous Every 8 hours 10/30/15 1605 10/31/15 1218     Assessment/Plan  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 Judeth Horn Sigmoid diverticulitis with perforation status post sigmoid colectomy and colostomy,4/17 Dr. [name] - UGI study significant for linear high attenuation in right upper abdomen on delayed images (11/02/15) but no evidence of leak on CT scan 8/8 - infection of superior aspect of laparotomy incision. Staples removed. Continue daily wet-to-dry dressing changes.  Hiatal hernia, recurrent - surgical jejunal anastomosis and remaining stomach above diaphragm - no s/s of bowel obstruction - will follow up in office  Leukocytosis - resolved Fever - resolved  ABL anemia- has been stable around 8.0-8.5 -hgb did  decrease to 7.3 today from 8.6 yesterday; suspect it is partially dilutional, especially if blood has been previously drawn from PICC line. No signs of further blood loss - no significant drops in BP, no tachycardia, weakness, or lightheadedness.   FEN: soft diet  DVT: SCDs , Lovenox Pulm: continue pulm toilet w/ added flutter valve  Plan: re-checlk a peripheral blood draw, advance to full iquid diet Possible discharge end of the day tomorrow vs vs Monday if tolerates soft diet and anemia continues to be stable.  Jill Alexanders , [facility] 11/08/2015, 10:17 AM Pager: [phone] Consults: [phone] Mon-Fri 7:00 am-4:30 pm Sat-Sun 7:00 am-11:30 am

## 2015-11-08 NOTE — Progress Notes (Signed)
[facility] PROGRESS NOTE  Miguel [name] [id number] DOB: 1951-02-20 DOA: 10/27/2015 PCP: Donnie Coffin, MD  Brief Narrative:  65 y.o.M Hx GIbleed related to NSAID use, Gastritis, recent Sigmoid Diverticulitis with perforation S/P Hartmann's procedure, and Nephrolithiasis who presented to the ED c/o hematemesis and syncope.  Wife reported 2 episodes of vomiting large amounts bright red blood. EMS was called and found patient pale and diaphoretic but alert. He then had another episode of emesis bright red blood of 250cc. He denied abdominal pain but did endorse continuous NSAID use over the years. April 2017 he had a perforated sigmoid diverticulitis with surgical repair hartmann procedure and colostomy. He is scheduled to have colostomy reversed August 10. Fever post-op with w/wound infection. Culture sent.  Assessment/Plan: Very large bleeding ulcer / Hiatal hernia / Hematemesis -Prior bleed related to NSAID overuse  -EGD 7/31 noted large HH and gastric ulcer with adherent clot - repeat EGDs with recurrent severe bleeding unable to address bleeding -Taken to the OR 8/3 for repair of bleeding ulcer with partial distal gastrectomy with Billroth II reconstruction and gastrojejunostomy -General Surgery following and directing this portion of his care - clinically stabilizing nicely - UGI done  Showed There was no leak seen during real-time imaging. However, on delayed images, linear high attenuation is seen in the medial right upper abdomen. While artifact is possible, the fact that the finding only appears on delayed images and not on the initial scout view is concerning for a possible leak. Recommend either a short-term follow-up upper GI or a CT scan for better evaluation.  Continue with NPO becauser of the above.  NG tube to low intermittent wall suction, repeat UGI in a few days. Removed. PICC line put in and TPN started. Ongoing.  CT abd and pelvis ordered , showed  pneumoperitoneum , and moderate amount of fluid in the pelvis and right pericolic gutter, related to gastric surgery. There was no fluid accumulation of abscess at the site of anastomosis or the duodenal stump..  Further recommendations as per surgery.   Post-Op Fever 2/2 wound infection:  - blood cultures x2 NG 2 days - updated care team about need for post-op infxn tx - checked UA neg  & CXR neg - Cont  empiric abx Vanc/Zosyn, pt with good clinical response again today - Aerobic cult sent-->pending, will de-escalate abx based on culture - Dr. Lucia Gaskins opened up surgical site at bedisde 8/10. Pus found. Pt now on q2 dressing changes. - per surg no img needed CT abd and pelvis shows pneumoperitoneum and free fluid in the abdomen, but no abscess.  Moderate Bilateral effusions seen.   New LE Edema - since post ope will check LE doppler--> neg  Acute Blood loss anemia transfuse as required to keep hemoglobin at 7.0 or greater.  1 unit prbc transfusion 8/8. Good response.   - remains stable today  Hypokalemia:  Replete as needed. Repeat level tomorrow  Low Ca? Corrected Ca nl at 9.0 on 8/10. Checked ionized Ca --> pending  Hypotension Blood pressure reasonably controlled. No change in medications.   Hypophosphatemia: Replete and repeat if needed  Hyperglycemia  -A1c 5.9 - CBG not significantly elevated CBG (last 3)   Recent Labs (last 2 labs)    Recent Labs  11/04/15 0037 11/04/15 0538 11/04/15 1216  GLUCAP 132* 121* 119*    resume SSI.     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 - not asking  for any pain meds.  - discontinued the dilaudid PCA to  iv Dilaudid 1 to 2 mg IV every 4 hours.  - encouraged him to get out of bed and walk in the hallway.    DVT prophylaxis: SCDs Code Status: FULL CODE Family Communication: multiple family members in the room, discussed the plan of care with the  patient.  Disposition Plan: pending further recommendations by surgery.   Consultants:  [facility]   Procedures: 7/31 EGD 8/2 EGD 8/3 EGD 8/3 partial distal gastrectomy with Billroth II reconstruction and gastrojejunostomy  Antimicrobials:   8/9 Vanc/Zosy--> P  HPI/Subjective: No events on tele. No events per nursing. Denies CP. No SOB.   Objective: Vitals:   11/08/15 0550 11/08/15 1429  BP: (!) 121/59 137/77  Pulse: 83 94  Resp: 19 18  Temp: 99 F (37.2 C) 98.6 F (37 C)    Intake/Output Summary (Last 24 hours) at 11/08/15 1614 Last data filed at 11/08/15 1430  Gross per 24 hour  Intake             1355 ml  Output             2375 ml  Net            -1020 ml   Filed Weights   11/04/15 0535 11/07/15 0500 11/08/15 0550  Weight: 80.2 kg (176 lb 14.7 oz) 79.9 kg (176 lb 2.4 oz) 77.8 kg (171 lb 9.6 oz)    Exam:  General:  No diaphoresis, anxious, no acute distress.  Cardiovascular: Regular rate and rhythm no murmurs rubs or gallops Respiratory: Clear to auscultation bilaterally no more breathing Abdomen: Nondistended bowel sounds normal nontender palpation. Liquid stool in bad. Beefy red stoma. Midline dressing CDI. Musculoskeletal: Moving all extremities, no deformity, 5 out of 5 strength. 2+ nonpitting edema bil GU: scrotal swelling  Data Reviewed: Basic Metabolic Panel:  Recent Labs Lab 11/03/15 1022 11/04/15 0500 11/04/15 1806 11/05/15 0530 11/06/15 0453 11/07/15 0939 11/08/15 0507  NA 140 136 134* 134* 135 132* 136  K 3.2* 3.3* 3.7 4.0 3.7 4.2 4.6  CL 108 104 103 101 100* 98* 100*  CO2 26 25 26 25 26 22 28   GLUCOSE 111* 114* 145* 130* 127* 141* 114*  BUN 10 8 8 8 10 10 8   CREATININE 0.72 0.61 0.73 0.73 0.83 0.98 0.99  CALCIUM 7.3* 6.8* 6.9* 7.3* 7.2* 8.0* 7.7*  MG 1.9 1.9  --  2.0 2.0  --   --   PHOS 1.4* 3.0  --  2.4* 4.2  --   --    Liver Function Tests:  Recent Labs Lab 11/04/15 0500 11/06/15 0453  AST 13* 16  ALT 9*  13*  ALKPHOS 42 54  BILITOT 0.4 0.6  PROT 4.0* 4.8*  ALBUMIN 1.5* 1.7*   No results for input(s): LIPASE, AMYLASE in the last 168 hours. No results for input(s): AMMONIA in the last 168 hours. CBC:  Recent Labs Lab 11/05/15 0530 11/06/15 0453 11/07/15 0939 11/08/15 0507 11/08/15 1238  WBC 18.0* 14.2* 13.3* 9.5 9.8  NEUTROABS  --  11.8* 10.9* 6.9  --   HGB 9.7* 8.1* 8.6* 7.3* 8.0*  HCT 29.6* 25.5* 27.4* 23.6* 25.0*  MCV 89.2 90.1 91.9 91.8 90.3  PLT 409* 370 447* 461* 482*   Cardiac Enzymes: No results for input(s): CKTOTAL, CKMB, CKMBINDEX, TROPONINI in the last 168 hours. BNP (last 3 results) No results for input(s): BNP in the last 8760 hours.  ProBNP (last 3 results) No results for input(s): PROBNP in the last 8760 hours.  CBG:  Recent Labs Lab 11/05/15 1317 11/05/15 1711 11/06/15 0040 11/06/15 0555 11/06/15 1229  GLUCAP 120* 101* 133* 128* 127*    Recent Results (from the past 240 hour(s))  Culture, blood (Routine X 2) w Reflex to ID Panel     Status: None (Preliminary result)   Collection Time: 11/04/15  5:05 PM  Result Value Ref Range Status   Specimen Description BLOOD LEFT HAND  Final   Special Requests BOTTLES DRAWN AEROBIC AND ANAEROBIC 5CC  Final   Culture NO GROWTH 4 DAYS  Final   Report Status PENDING  Incomplete  Culture, blood (Routine X 2) w Reflex to ID Panel     Status: None (Preliminary result)   Collection Time: 11/04/15  6:06 PM  Result Value Ref Range Status   Specimen Description BLOOD LEFT ANTECUBITAL  Final   Special Requests BOTTLES DRAWN AEROBIC ONLY 10CC  Final   Culture NO GROWTH 4 DAYS  Final   Report Status PENDING  Incomplete  Aerobic Culture (superficial specimen)     Status: None (Preliminary result)   Collection Time: 11/07/15 12:21 PM  Result Value Ref Range Status   Specimen Description ABDOMEN  Final   Special Requests NONE  Final   Gram Stain   Final    FEW WBC PRESENT, PREDOMINANTLY PMN RARE GRAM POSITIVE COCCI  IN PAIRS    Culture CULTURE REINCUBATED FOR BETTER GROWTH  Final   Report Status PENDING  Incomplete     Studies: No results found.  Scheduled Meds: . famotidine  20 mg Oral BID  . piperacillin-tazobactam (ZOSYN)  IV  3.375 g Intravenous Q8H  . sodium chloride flush  3 mL Intravenous Q12H  . vancomycin  1,250 mg Intravenous Q12H   Continuous Infusions: . 0.45 % NaCl with KCl 20 mEq / L 50 mL/hr at 11/08/15 1226    Principal Problem:   Acute blood loss anemia Active Problems:   Diverticulitis of colon with perforation   GI bleed   Syncope   Hyperglycemia   Hypotension   Neck pain   Hematemesis   Hematochezia   Left shoulder pain   Muscle spasm of left shoulder   Trapezius muscle spasm   Fever   Leg swelling    Time spent: [facility] Pager see amion. If 7PM-7AM, please contact night-coverage at [email or web address], password [facility] 11/08/2015, 4:14 PM  LOS: 12 days

## 2015-11-08 NOTE — Progress Notes (Signed)
Pharmacy Antibiotic Note  Miguel [name] is a 65 y.o. male admitted on 10/27/2015 with hematemesis and syncope.  Pharmacy has been consulted for vancomycin and Zosyn as empiric therapy for post-op fever and leukocytosis.  Patient's renal function is stable.  Plan: - Continue Vanc 1250mg  IV Q12H - Zosyn 3.375gm IV Q8H, 4 hr infusion - Monitor renal fxn, clinical progress, vanc trough tomorrow if still on therapy - Does patient still need D10W?   Height: 5\' 5"  (165.1 cm) Weight: 171 lb 9.6 oz (77.8 kg) IBW/kg (Calculated) : 61.5  Temp (24hrs), Avg:99.5 F (37.5 C), Min:99 F (37.2 C), Max:99.7 F (37.6 C)   Recent Labs Lab 11/04/15 0500 11/04/15 1806 11/05/15 0530 11/06/15 0453 11/07/15 0939 11/08/15 0507  WBC 13.9*  --  18.0* 14.2* 13.3* 9.5  CREATININE 0.61 0.73 0.73 0.83 0.98 0.99    Estimated Creatinine Clearance: 71.5 mL/min (by C-G formula based on SCr of 0.99 mg/dL).    Allergies  Allergen Reactions  . No Known Allergies     Antimicrobials this admission: Vanc 8/9 >> Zosyn 8/9 >>  Dose adjustments this admission: N/A  Microbiology results: 8/8 BCx x2 - NGTD 8/11 abd cx - GPC on Gram stain   [name] D. Mina Marble, PharmD, BCPS Pager:  [phone] 11/08/2015, 10:27 AM

## 2015-11-09 LAB — CBC WITH DIFFERENTIAL/PLATELET
Basophils Absolute: 0 10*3/uL (ref 0.0–0.1)
Basophils Relative: 0 %
EOS ABS: 0.5 10*3/uL (ref 0.0–0.7)
Eosinophils Relative: 5 %
HCT: 25.5 % — ABNORMAL LOW (ref 39.0–52.0)
Hemoglobin: 7.9 g/dL — ABNORMAL LOW (ref 13.0–17.0)
LYMPHS ABS: 1.3 10*3/uL (ref 0.7–4.0)
Lymphocytes Relative: 12 %
MCH: 28.3 pg (ref 26.0–34.0)
MCHC: 31 g/dL (ref 30.0–36.0)
MCV: 91.4 fL (ref 78.0–100.0)
MONO ABS: 1.1 10*3/uL — AB (ref 0.1–1.0)
Monocytes Relative: 10 %
NEUTROS PCT: 73 %
Neutro Abs: 7.8 10*3/uL — ABNORMAL HIGH (ref 1.7–7.7)
PLATELETS: 530 10*3/uL — AB (ref 150–400)
RBC: 2.79 MIL/uL — AB (ref 4.22–5.81)
RDW: 15.9 % — ABNORMAL HIGH (ref 11.5–15.5)
WBC: 10.7 10*3/uL — AB (ref 4.0–10.5)

## 2015-11-09 LAB — VANCOMYCIN, TROUGH: VANCOMYCIN TR: 11 ug/mL — AB (ref 15–20)

## 2015-11-09 LAB — COMPREHENSIVE METABOLIC PANEL
ALBUMIN: 1.8 g/dL — AB (ref 3.5–5.0)
ALT: 32 U/L (ref 17–63)
ANION GAP: 10 (ref 5–15)
AST: 23 U/L (ref 15–41)
Alkaline Phosphatase: 85 U/L (ref 38–126)
BUN: 7 mg/dL (ref 6–20)
CALCIUM: 7.7 mg/dL — AB (ref 8.9–10.3)
CO2: 27 mmol/L (ref 22–32)
Chloride: 102 mmol/L (ref 101–111)
Creatinine, Ser: 1.05 mg/dL (ref 0.61–1.24)
GFR calc Af Amer: >60 mL/min (ref >60)
Glucose, Bld: 107 mg/dL — ABNORMAL HIGH (ref 65–99)
POTASSIUM: 4.1 mmol/L (ref 3.5–5.1)
SODIUM: 139 mmol/L (ref 135–145)
TOTAL PROTEIN: 4.9 g/dL — AB (ref 6.5–8.1)
Total Bilirubin: 0.3 mg/dL (ref 0.3–1.2)

## 2015-11-09 LAB — CULTURE, BLOOD (ROUTINE X 2)
Culture: NO GROWTH
Culture: NO GROWTH

## 2015-11-09 MED ORDER — FAMOTIDINE 20 MG PO TABS: 20.0000 mg | ORAL_TABLET | Freq: Two times a day (BID) | ORAL | 0 refills | Status: DC

## 2015-11-09 MED ORDER — OXYCODONE-ACETAMINOPHEN 5-325 MG PO TABS: 1.0000 | ORAL_TABLET | ORAL | 0 refills | Status: DC | PRN

## 2015-11-09 MED ORDER — CLINDAMYCIN HCL 300 MG PO CAPS: 300.0000 mg | ORAL_CAPSULE | Freq: Three times a day (TID) | ORAL | 0 refills | Status: DC

## 2015-11-09 NOTE — Progress Notes (Signed)
[facility] Progress Note  10 Days Post-Op  Subjective: Ambulating, feels less fatigued than yesterday. Denies fever, chills, abdominal pain, nausea, or vomiting. + early satiety, to be expected. Ambulating. + gas and stool in ostomy pouch.  Objective: Vital signs in last 24 hours: Temp:  [98.6 F (37 C)-99.5 F (37.5 C)] 98.7 F (37.1 C) (08/13 0445) Pulse Rate:  [75-94] 75 (08/13 0445) Resp:  [17-18] 17 (08/13 0445) BP: (121-137)/(69-77) 128/73 (08/13 0445) SpO2:  [96 %-98 %] 96 % (08/13 0445) Last BM Date: 11/08/15  Intake/Output from previous day: 08/12 0701 - 08/13 0700 In: 2165 [P.O.:480; I.V.:1085; IV Piggyback:600] Out: 2950 [Urine:2350; Stool:600] Intake/Output this shift: No intake/output data recorded.  PE: Gen:  Alert, NAD, pleasant Card:  RRR, no M/G/R  Pulm:  CTA, no W/R/R Abd: Soft, NT/ND, +BS, no HSM, incision C/D/I, with dressing in place. gas and liquid stool in ostomy pouch.  Lab Results:   Recent Labs  11/08/15 1238 11/09/15 0308  WBC 9.8 10.7*  HGB 8.0* 7.9*  HCT 25.0* 25.5*  PLT 482* 530*   BMET  Recent Labs  11/08/15 0507 11/09/15 0308  NA 136 139  K 4.6 4.1  CL 100* 102  CO2 28 27  GLUCOSE 114* 107*  BUN 8 7  CREATININE 0.99 1.05  CALCIUM 7.7* 7.7*   PT/INR No results for input(s): LABPROT, INR in the last 72 hours. CMP     Component Value Date/Time   NA 139 11/09/2015 0308   K 4.1 11/09/2015 0308   CL 102 11/09/2015 0308   CO2 27 11/09/2015 0308   GLUCOSE 107 (H) 11/09/2015 0308   BUN 7 11/09/2015 0308   CREATININE 1.05 11/09/2015 0308   CALCIUM 7.7 (L) 11/09/2015 0308   PROT 4.9 (L) 11/09/2015 0308   ALBUMIN 1.8 (L) 11/09/2015 0308   AST 23 11/09/2015 0308   ALT 32 11/09/2015 0308   ALKPHOS 85 11/09/2015 0308   BILITOT 0.3 11/09/2015 0308   GFRNONAA >60 11/09/2015 0308   GFRAA >60 11/09/2015 0308   Lipase  No results found for: LIPASE     Studies/Results: No results  found.  Anti-infectives: Anti-infectives    Start     Dose/Rate Route Frequency Ordered Stop   11/06/15 0000  vancomycin (VANCOCIN) 1,250 mg in sodium chloride 0.9 % 250 mL IVPB     1,250 mg 166.7 mL/hr over 90 Minutes Intravenous Every 12 hours 11/05/15 1100     11/05/15 2100  piperacillin-tazobactam (ZOSYN) IVPB 3.375 g     3.375 g 12.5 mL/hr over 240 Minutes Intravenous Every 8 hours 11/05/15 1100     11/05/15 1045  piperacillin-tazobactam (ZOSYN) IVPB 3.375 g     3.375 g 100 mL/hr over 30 Minutes Intravenous  Once 11/05/15 1032 11/05/15 1243   11/05/15 1045  vancomycin (VANCOCIN) 1,750 mg in sodium chloride 0.9 % 500 mL IVPB     1,750 mg 250 mL/hr over 120 Minutes Intravenous  Once 11/05/15 1032 11/05/15 1619   10/30/15 2000  ceFAZolin (ANCEF) IVPB 1 g/50 mL premix     1 g 100 mL/hr over 30 Minutes Intravenous Every 8 hours 10/30/15 1605 10/31/15 1218     Assessment/Plan 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 Judeth Horn Sigmoid diverticulitis with perforation status post sigmoid colectomy and colostomy,4/17 Dr. [name] - UGI study significant for linear high attenuation in right upper abdomen on delayed images (11/02/15) but no evidence  of leak on CT scan 8/8 - infection of superior aspect of laparotomy incision. Staples removed. Continue daily wet-to-dry dressing changes.  Hiatal hernia, recurrent - surgical jejunal anastomosis and remaining stomach above diaphragm - no s/s of bowel obstruction - will follow up in office  Leukocytosis - resolved Fever - resolved  ABL anemia- has been stable around 8.0; should go home on iron therapy  FEN: soft diet  DVT: SCDs , Lovenox Pulm: continue pulm toilet w/ added flutter valve  Plan:stable for discharge from a surgical standpoint. Should receive PO iron at d/c and follow-up with Dr. Ninfa Linden. Continue daily  dressing changes. Eat 4-5 small, soft meals daily rather than 3 large meals.    LOS: 13 days    Jill Alexanders , [facility] 11/09/2015, 10:14 AM Pager: [phone] Consults: [phone] Mon-Fri 7:00 am-4:30 pm Sat-Sun 7:00 am-11:30 am

## 2015-11-09 NOTE — Discharge Summary (Signed)
Physician Discharge Summary  [name] [id number] DOB: 1951-02-07 DOA: 10/27/2015  PCP: Donnie Coffin, MD  Admit date: 10/27/2015 Discharge date: 11/09/2015  Time spent: 35 minutes  Recommendations for Outpatient Follow-up:  1. Gen surg 2 weeks  [facility] prescription database was reviewed. Patient has had 18 narcotic prescriptions in the last 18 months.  Discharge Diagnoses:  Principal Problem:   Acute blood loss anemia Active Problems:   Diverticulitis of colon with perforation   GI bleed   Syncope   Hyperglycemia   Hypotension   Neck pain   Hematemesis   Hematochezia   Left shoulder pain   Muscle spasm of left shoulder   Trapezius muscle spasm   Fever   Leg swelling   Discharge Condition: stable  Diet recommendation: reg  Filed Weights   11/04/15 0535 11/07/15 0500 11/08/15 0550  Weight: 80.2 kg (176 lb 14.7 oz) 79.9 kg (176 lb 2.4 oz) 77.8 kg (171 lb 9.6 oz)    History of present illness:  Miguel [name] is a 65 y.o. male with medical history significant for gi bleed related to nsaid use, gastritis, recent sigmoid diverticulitis with perforation s/p Hartmann's procedure presents to ED hematemesis and syncope. Initial evaluation reveals acute blood loss anemia likely related to GI bleed in setting of NSAID use.  Information is obtained from the chart and the patient and the wife who is at the bedside. Wife reports patient with 2 episodes of vomiting large amounts bright red blood. EMS was called and reportedly patient was pale diaphoretic but alert. He then had an episode of emesis bright red blood of 250cc. his blood pressure 60 palp with a heart rate of 1:30 he was immediately given 500 mL of normal saline. Patient reports several weeks he started feeling "not too good". He complains of intermittent nausea without emesis some shortness of breath with exertion and worsening chronic left shoulder pain. In addition he was having a feeling of constipation for  which he took miralax. He denies abdominal pain but does endorse continuous NSAID use over the years. Of note in April of this he had perforated sigmoid diverticulitis with surgical repair hartmann procedure and colostomy. He is scheduled to have colostomy reversed August 10. He denies any fever chills cough abdominal pain dysuria hematuria frequency or urgency.  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 Opened at bedside. Pt has been started on broad spectrum abx. Img advised against by gen surg. 8/8 blood transfusion Potassium replaced as needed and monitored.  Procedures:  7/31, 8/2, 8/3 Upper EGD  Consultations:  Gen Surg, GI  Discharge Exam: Vitals:   11/09/15 0445 11/09/15 1402  BP: 128/73 123/76  Pulse: 75 90  Resp: 17   Temp: 98.7 F (37.1 C) 99.1 F (37.3 C)     General:  No diaphoresis, anxious, no acute distress.   Cardiovascular: Regular rate and rhythm no murmurs rubs or gallops  Respiratory: Clear to auscultation bilaterally no more breathing  Abdomen: Nondistended bowel sounds normal nontender palpation. Liquid stool in bad. Beefy red stoma. Midline dressing CDI.  Musculoskeletal: Moving all extremities, no deformity, 5 out of 5 strength. 2+ nonpitting edema bil  GU: scrotal swelling  Discharge Instructions   Discharge Instructions    AMB Referral to [facility]    Complete by:  As directed   Please assign [facility] member for post toc call when discharged. Currently at [facility].  Thanks and please call with questions. [name], [place], RN,BSN [facility] [phone]   Reason for  consult:  Please assign UMR member for post toc call when discharged   Expected date of contact:  1-3 days (reserved for hospital discharges)     Discharge Medication List as of 11/09/2015  2:51 PM    START taking these medications   Details  clindamycin (CLEOCIN) 300 MG capsule Take 1 capsule (300 mg total) by mouth 3 (three) times daily., Starting Sun 11/09/2015, Until Mon 11/17/2015, Normal    famotidine (PEPCID) 20 MG tablet Take 1 tablet (20 mg total) by mouth 2 (two) times daily., Starting Sun 11/09/2015, Normal      CONTINUE these medications which have CHANGED   Details  oxyCODONE-acetaminophen (PERCOCET/ROXICET) 5-325 MG tablet Take 1-2 tablets by mouth every 4 (four) hours as needed for moderate pain., Starting Sun 11/09/2015, Print      CONTINUE these medications which have NOT CHANGED   Details  acetaminophen (TYLENOL) 500 MG tablet Take 1,000 mg by mouth 3 (three) times daily. , Historical Med    aspirin EC 81 MG tablet Take 81 mg by mouth daily after supper. , Historical Med    Calcium Polycarbophil (FIBER-LAX PO) Take 2 capsules by mouth 2 (two) times daily as needed (constipation)., Historical Med    diazepam (VALIUM) 2 MG tablet Take 1 tablet (2 mg total) by mouth every 6 (six) hours as needed for muscle spasms., Starting Sat 10/11/2015, Print    ferrous sulfate 325 (65 FE) MG tablet Take 325 mg by mouth daily., Historical Med    Multiple Vitamin (MULTIVITAMIN WITH MINERALS) TABS tablet Take 1 tablet by mouth daily after supper. , Historical Med    Omega-3 Fatty Acids (FISH OIL) 1200 MG CAPS Take 2,400 mg by mouth daily after supper. , Historical Med    ondansetron (ZOFRAN) 4 MG tablet Take 4 mg by mouth every 8 (eight) hours as needed for nausea or vomiting., Historical Med    polyethylene glycol (MIRALAX / GLYCOLAX) packet Take 8.5-17 g by mouth 2 (two) times daily as needed (constipation). Mix in 8 oz liquid and drink, Historical Med    zolpidem (AMBIEN) 10 MG  tablet Take 5 mg by mouth See admin instructions. Take 1/2 tablet (5 mg) by mouth daily at bedtime, may also take another 1/2 tablet if needed during the night, Historical Med      STOP taking these medications     ibuprofen (ADVIL,MOTRIN) 200 MG tablet      lisinopril (PRINIVIL,ZESTRIL) 10 MG tablet      LORazepam (ATIVAN) 1 MG tablet      meloxicam (MOBIC) 15 MG tablet      Polyethyl Glycol-Propyl Glycol (SYSTANE OP)      Red Yeast Rice Extract (RED YEAST RICE PO)        Allergies  Allergen Reactions  . No Known Allergies    Follow-up Information    BLACKMAN,DOUGLAS A, MD. Schedule an appointment as soon as possible for a visit in 2 week(s).   Specialty:  General Surgery Why:  for post-operative follow up.  Contact information: [place] [address] [phone]  Donnie Coffin, MD. Schedule an appointment as soon as possible for a visit in 1 week(s).   Specialty:  Family Medicine Why:  Hospital admission follow-up, discuss restarting red yeast and lisinopril Contact information: [address] [place] [address] [phone]            The results of significant diagnostics from this hospitalization (including imaging, microbiology, ancillary and laboratory) are listed below for reference.    Significant Diagnostic Studies: Dg Chest 2 View  Result Date: 11/05/2015 CLINICAL DATA:  Fever. EXAM: CHEST  2 VIEW COMPARISON:  Radiograph of October 30, 2015. FINDINGS: The heart size and mediastinal contours are within normal limits. Large hiatal hernia is again noted. Nasogastric tube tip remains within hiatal hernia. Mildly increased bilateral lung opacities are noted concerning for atelectasis and associated pleural effusions. No pneumothorax is noted. Interval placement of right-sided PICC line with distal tip in expected position of the SVC. Pneumoperitoneum is noted under right hemidiaphragm consistent with recent  surgery. The visualized skeletal structures are unremarkable. IMPRESSION: Mildly increased bibasilar atelectasis with associated pleural effusions is noted. Interval placement of right-sided PICC line with distal tip in expected position of the SVC. Stable large hiatal hernia. Electronically Signed   By: Marijo Conception, M.D.   On: 11/05/2015 12:20   Dg Chest 2 View  Result Date: 10/11/2015 CLINICAL DATA:  Back pain for a week with new onset cough. EXAM: CHEST  2 VIEW COMPARISON:  06/20/2009 FINDINGS: The lungs are clear wiithout focal pneumonia, edema, pneumothorax or pleural effusion. Telemetry pad over bilateral lungs. The cardiopericardial silhouette is within normal limits for size. The visualized bony structures of the thorax are intact. Hiatal hernia noted. IMPRESSION: No active cardiopulmonary disease. Electronically Signed   By: Misty Stanley M.D.   On: 10/11/2015 13:38   Dg Thoracic Spine W/swimmers  Result Date: 10/11/2015 CLINICAL DATA:  Diffuse thoracic spine pain for several months without known injury. EXAM: THORACIC SPINE - 3 VIEWS COMPARISON:  CT scan of November 03, 2013. FINDINGS: No fracture or spondylolisthesis is noted. Anterior osteophyte formation is noted in the mid thoracic spine. Disc spaces are well-maintained. IMPRESSION: Mild degenerative changes as described above. No acute abnormality seen in the thoracic spine. Electronically Signed   By: Marijo Conception, M.D.   On: 10/11/2015 16:29   Ct Abdomen Pelvis W Contrast  Result Date: 11/04/2015 CLINICAL DATA:  Generalized abdominal pain. EXAM: CT ABDOMEN AND PELVIS WITH CONTRAST TECHNIQUE: Multidetector CT imaging of the abdomen and pelvis was performed using the standard protocol following bolus administration of intravenous contrast. CONTRAST:  149mL ISOVUE-300 IOPAMIDOL (ISOVUE-300) INJECTION 61% COMPARISON:  CT scan of July 04, 2015. Barium examination of November 02, 2015. FINDINGS: Mild degenerative disc disease is noted at L1-2  and L5-S1. Moderate bilateral pleural effusions are noted with adjacent subsegmental atelectasis. No gallstones are noted. The liver, spleen and pancreas unremarkable. Adrenal glands appear normal. Stable right renal cyst is noted. Bilateral nephrolithiasis is noted. No hydronephrosis or renal obstruction is noted. Pneumoperitoneum is noted consistent with history of recent gastric surgery. Large hiatal hernia is noted as well, containing almost the entire stomach and potentially a small bowel loop. Moderate amount of free fluid is noted in the pelvis and right pericolic gutter. 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 Peristomal hernia is noted which contains small bowel loops, but does not result in incarceration or obstruction. Mild ventral hernia is also noted in the midline. Urinary bladder appears normal. No  significant adenopathy is noted. IMPRESSION: Moderate bilateral pleural effusions are noted with adjacent subsegmental atelectasis. Bilateral nonobstructive nephrolithiasis. Large hiatal hernia is again noted, containing almost the entire stomach and potentially a small bowel loop. Left lower quadrant colostomy is noted, with peristomal hernia which contains small bowel loops, but does not result in incarceration or obstruction. Pneumoperitoneum is noted as well as a moderate amount of fluid in the pelvis and right pericolic gutter which most likely is related to recent gastric surgery. These results were called by telephone at the time of interpretation on 11/04/2015 at 12:51 pm to Dr. Karleen Hampshire, who verbally acknowledged these results. Electronically Signed   By: Marijo Conception, M.D.   On: 11/04/2015 12:54   Dg Chest Port 1 View  Result Date: 10/30/2015 CLINICAL DATA:  Hiatal hernia. EXAM: PORTABLE CHEST 1 VIEW COMPARISON:  10/11/2015. FINDINGS: An oral/nasogastric tube has been placed since the prior study. Tip projects over the left heart border consistent with it residing in a hiatal hernia. It lies  above the left hemidiaphragm. There is hazy perihilar and lung base opacity, greater on the left, new since the prior exam. This is likely combination of pleural effusions and atelectasis. Pneumonia is possible. Remainder of the lungs is clear. No pneumothorax. Cardiac silhouette is normal in size. No mediastinal or hilar masses. IMPRESSION: 1. New lung base opacity, left greater than right, likely combination of pleural effusions and atelectasis. Pneumonia should be considered if there are consistent clinical symptoms. 2. Nasal/orogastric tube tip projects within a moderate size hiatal hernia, above the left hemidiaphragm. Electronically Signed   By: Lajean Manes M.D.   On: 10/30/2015 17:30   Dg Shoulder Left  Result Date: 10/28/2015 CLINICAL DATA:  Acute on chronic left shoulder pain without known injury. EXAM: LEFT SHOULDER - 2+ VIEW COMPARISON:  None. FINDINGS: There is no evidence of fracture or dislocation. There is no evidence of arthropathy or other focal bone abnormality. Soft tissues are unremarkable. IMPRESSION: Normal left shoulder. Electronically Signed   By: Marijo Conception, M.D.   On: 10/28/2015 15:34   Dg Duanne Limerick  W/kub  Addendum Date: 11/02/2015   ADDENDUM REPORT: 11/02/2015 16:06 ADDENDUM: The findings were called to the patient's physician, Dr. Brantley Stage Electronically Signed   By: Dorise Bullion III M.D   On: 11/02/2015 16:06   Result Date: 11/02/2015 CLINICAL DATA:  Recent antrectomy with gastrojejunostomy. Evaluate for leak. EXAM: WATER SOLUBLE UPPER GI SERIES TECHNIQUE: Single-column upper GI series was performed using water soluble contrast. CONTRAST:  60 cc of Gastrografin COMPARISON:  None FLUOROSCOPY TIME:  If the device does not provide the exposure index: Fluoroscopy Time (in minutes and seconds):  2.3 minutes Number of Acquired Images: 15 saved images. No individual exposures. FINDINGS: On the initial scout view, no radiopaque material overlies the lower chest or upper abdomen. Skin  staples were seen. An NG tube is seen in the left retrocardiac region, at the site of surgery. During real-time imaging, 60 cc of Gastrografin was injected. No leak was seen during real-time imaging. However, on delayed imaging, there is a linear region of high attenuation, equal to the attenuation of contrast, in the medial right upper abdomen. The patient's clothing was removed from this region and the finding persisted on a repeat KUB. This finding was not seen on the original scout view. IMPRESSION: There was no leak seen during real-time imaging. However, on delayed images, linear high attenuation is seen in the medial right upper abdomen. While artifact is  possible, the fact that the finding only appears on delayed images and not on the initial scout view is concerning for a possible leak. Recommend either a short-term follow-up upper GI or a CT scan for better evaluation. The findings will be called to the referring clinician by myself. Electronically Signed: By: Dorise Bullion III M.D On: 11/02/2015 12:32    Microbiology: Recent Results (from the past 240 hour(s))  Culture, blood (Routine X 2) w Reflex to ID Panel     Status: None   Collection Time: 11/04/15  5:05 PM  Result Value Ref Range Status   Specimen Description BLOOD LEFT HAND  Final   Special Requests BOTTLES DRAWN AEROBIC AND ANAEROBIC 5CC  Final   Culture NO GROWTH 5 DAYS  Final   Report Status 11/09/2015 FINAL  Final  Culture, blood (Routine X 2) w Reflex to ID Panel     Status: None   Collection Time: 11/04/15  6:06 PM  Result Value Ref Range Status   Specimen Description BLOOD LEFT ANTECUBITAL  Final   Special Requests BOTTLES DRAWN AEROBIC ONLY 10CC  Final   Culture NO GROWTH 5 DAYS  Final   Report Status 11/09/2015 FINAL  Final  Aerobic Culture (superficial specimen)     Status: None (Preliminary result)   Collection Time: 11/07/15 12:21 PM  Result Value Ref Range Status   Specimen Description ABDOMEN  Final    Special Requests NONE  Final   Gram Stain   Final    FEW WBC PRESENT, PREDOMINANTLY PMN RARE GRAM POSITIVE COCCI IN PAIRS    Culture CULTURE REINCUBATED FOR BETTER GROWTH  Final   Report Status PENDING  Incomplete     Labs: Basic Metabolic Panel:  Recent Labs Lab 11/03/15 1022 11/04/15 0500  11/05/15 0530 11/06/15 0453 11/07/15 0939 11/08/15 0507 11/09/15 0308  NA 140 136  < > 134* 135 132* 136 139  K 3.2* 3.3*  < > 4.0 3.7 4.2 4.6 4.1  CL 108 104  < > 101 100* 98* 100* 102  CO2 26 25  < > 25 26 22 28 27   GLUCOSE 111* 114*  < > 130* 127* 141* 114* 107*  BUN 10 8  < > 8 10 10 8 7   CREATININE 0.72 0.61  < > 0.73 0.83 0.98 0.99 1.05  CALCIUM 7.3* 6.8*  < > 7.3* 7.2* 8.0* 7.7* 7.7*  MG 1.9 1.9  --  2.0 2.0  --   --   --   PHOS 1.4* 3.0  --  2.4* 4.2  --   --   --   < > = values in this interval not displayed. Liver Function Tests:  Recent Labs Lab 11/04/15 0500 11/06/15 0453 11/09/15 0308  AST 13* 16 23  ALT 9* 13* 32  ALKPHOS 42 54 85  BILITOT 0.4 0.6 0.3  PROT 4.0* 4.8* 4.9*  ALBUMIN 1.5* 1.7* 1.8*   No results for input(s): LIPASE, AMYLASE in the last 168 hours. No results for input(s): AMMONIA in the last 168 hours. CBC:  Recent Labs Lab 11/06/15 0453 11/07/15 0939 11/08/15 0507 11/08/15 1238 11/09/15 0308  WBC 14.2* 13.3* 9.5 9.8 10.7*  NEUTROABS 11.8* 10.9* 6.9  --  7.8*  HGB 8.1* 8.6* 7.3* 8.0* 7.9*  HCT 25.5* 27.4* 23.6* 25.0* 25.5*  MCV 90.1 91.9 91.8 90.3 91.4  PLT 370 447* 461* 482* 530*   Cardiac Enzymes: No results for input(s): CKTOTAL, CKMB, CKMBINDEX, TROPONINI in the last 168 hours. BNP:  BNP (last 3 results) No results for input(s): BNP in the last 8760 hours.  ProBNP (last 3 results) No results for input(s): PROBNP in the last 8760 hours.  CBG:  Recent Labs Lab 11/05/15 1317 11/05/15 1711 11/06/15 0040 11/06/15 0555 11/06/15 1229  GLUCAP 120* 101* 133* 128* 127*       Signed:  Elwin Mocha MD  FACP  [facility] 11/09/2015, 2:50 PM

## 2015-11-09 NOTE — Discharge Instructions (Signed)
[facility]      [facility] °[phone] ° °OPEN ABDOMINAL SURGERY: POST OP INSTRUCTIONS ° °Always review your discharge instruction sheet given to you by the facility where your surgery was performed. ° °IF YOU HAVE DISABILITY OR FAMILY LEAVE FORMS, YOU MUST BRING THEM TO THE OFFICE FOR PROCESSING.  PLEASE DO NOT GIVE THEM TO YOUR DOCTOR. ° °1. A prescription for pain medication may be given to you upon discharge.  Take your pain medication as prescribed, if needed.  If narcotic pain medicine is not needed, then you may take acetaminophen (Tylenol) or ibuprofen (Advil) as needed. °2. Take your usually prescribed medications unless otherwise directed. °3. If you need a refill on your pain medication, please contact your pharmacy. They will contact our office to request authorization.  Prescriptions will not be filled after 5pm or on week-ends. °4. You should follow a light diet the first few days after arrival home, such as soup and crackers, pudding, etc.unless your doctor has advised otherwise. A high-fiber, low fat diet can be resumed as tolerated.   Be sure to include lots of fluids daily. Most patients will experience some swelling and bruising on the chest and neck area.  Ice packs will help.  Swelling and bruising can take several days to resolve °5. Most patients will experience some swelling and bruising in the area of the incision. Ice pack will help. Swelling and bruising can take several days to resolve..  °6. It is common to experience some constipation if taking pain medication after surgery.  Increasing fluid intake and taking a stool softener will usually help or prevent this problem from occurring.  A mild laxative (Milk of Magnesia or Miralax) should be taken according to package directions if there are no bowel movements after 48 hours. °7.  You may have steri-strips (small skin tapes) in place directly over the incision.  These strips should be left on the skin for 7-10 days.  If your  surgeon used skin glue on the incision, you may shower in 24 hours.  The glue will flake off over the next 2-3 weeks.  Any sutures or staples will be removed at the office during your follow-up visit. You may find that a light gauze bandage over your incision may keep your staples from being rubbed or pulled. You may shower and replace the bandage daily. °8. ACTIVITIES:  You may resume regular (light) daily activities beginning the next day--such as daily self-care, walking, climbing stairs--gradually increasing activities as tolerated.  You may have sexual intercourse when it is comfortable.  Refrain from any heavy lifting or straining until approved by your doctor. °a. You may drive when you no longer are taking prescription pain medication, you can comfortably wear a seatbelt, and you can safely maneuver your car and apply brakes °b. Return to Work: ___________________________________ °9. You should see your doctor in the office for a follow-up appointment approximately two weeks after your surgery.  Make sure that you call for this appointment within a day or two after you arrive home to insure a convenient appointment time. °OTHER INSTRUCTIONS:  °_____________________________________________________________ °_____________________________________________________________ ° °WHEN TO CALL YOUR DOCTOR: °1. Fever over 101.0 °2. Inability to urinate °3. Nausea and/or vomiting °4. Extreme swelling or bruising °5. Continued bleeding from incision. °6. Increased pain, redness, or drainage from the incision. °7. Difficulty swallowing or breathing °8. Muscle cramping or spasms. °9. Numbness or tingling in hands or feet or around lips. ° °The clinic staff is available to   answer your questions during regular business hours.  Please dont hesitate to call and ask to speak to one of the nurses if you have concerns.  For further questions, please visit [email or web address]  Low-Fiber Diet Fiber is found in  fruits, vegetables, and whole grains. A low-fiber diet restricts fibrous foods that are not digested in the small intestine. A diet containing about 10-15 grams of fiber per day is considered low fiber. Low-fiber diets may be used to:  Promote healing and rest the bowel during intestinal flare-ups.  Prevent blockage of a partially obstructed or narrowed gastrointestinal tract.  Reduce fecal weight and volume.  Slow the movement of feces. You may be on a low-fiber diet as a transitional diet following surgery, after an injury (trauma), or because of a short (acute) or lifelong (chronic) illness. Your health care provider will determine the length of time you need to stay on this diet.  WHAT DO I NEED TO KNOW ABOUT A LOW-FIBER DIET? Always check the fiber content on the packaging's Nutrition Facts label, especially on foods from the grains list. Ask your dietitian if you have questions about specific foods that are related to your condition, especially if the food is not listed below. In general, a low-fiber food will have less than 2 g of fiber. WHAT FOODS CAN I EAT? Grains All breads and crackers made with white flour. Sweet rolls, doughnuts, waffles, pancakes, [place] toast, bagels. Pretzels, Melba toast, zwieback. Well-cooked cereals, such as cornmeal, farina, or cream cereals. Dry cereals that do not contain whole grains, fruit, or nuts, such as refined corn, wheat, rice, and oat cereals. Potatoes prepared any way without skins, plain pastas and noodles, refined white rice. Use white flour for baking and making sauces. Use allowed list of grains for casseroles, dumplings, and puddings.  Vegetables Strained tomato and vegetable juices. Fresh lettuce, cucumber, spinach. Well-cooked (no skin or pulp) or canned vegetables, such as asparagus, bean sprouts, beets, carrots, green beans, mushrooms, potatoes, pumpkin, spinach, yellow squash, tomato sauce/puree, turnips, yams, and zucchini. Keep servings  limited to  cup.  Fruits All fruit juices except prune juice. Cooked or canned fruits without skin and seeds, such as applesauce, apricots, cherries, fruit cocktail, grapefruit, grapes, mandarin oranges, melons, peaches, pears, pineapple, and plums. Fresh fruits without skin, such as apricots, avocados, bananas, melons, pineapple, nectarines, and peaches. Keep servings limited to  cup or 1 piece.  Meat and Other Protein Sources Ground or well-cooked tender beef, ham, veal, lamb, pork, or poultry. Eggs, plain cheese. Fish, oysters, shrimp, lobster, and other seafood. Liver, organ meats. Smooth nut butters. Dairy All milk products and alternative dairy substitutes, such as soy, rice, almond, and coconut, not containing added whole nuts, seeds, or added fruit. Beverages Decaf coffee, fruit, and vegetable juices or smoothies (small amounts, with no pulp or skins, and with fruits from allowed list), sports drinks, herbal tea. Condiments Ketchup, mustard, vinegar, cream sauce, cheese sauce, cocoa powder. Spices in moderation, such as allspice, basil, bay leaves, celery powder or leaves, cinnamon, cumin powder, curry powder, ginger, mace, marjoram, onion or garlic powder, oregano, paprika, parsley flakes, ground pepper, rosemary, sage, savory, tarragon, thyme, and turmeric. Sweets and Desserts Plain cakes and cookies, pie made with allowed fruit, pudding, custard, cream pie. Gelatin, fruit, ice, sherbet, frozen ice pops. Ice cream, ice milk without nuts. Plain hard candy, honey, jelly, molasses, syrup, sugar, chocolate syrup, gumdrops, marshmallows. Limit overall sugar intake.  Fats and Oil Margarine, butter, cream, mayonnaise, salad oils, plain salad  dressings made from allowed foods. Choose healthy fats such as olive oil, canola oil, and omega-3 fatty acids (such as found in salmon or tuna) when possible.  Other Bouillon, broth, or cream soups made from allowed foods. Any strained soup. Casseroles or  mixed dishes made with allowed foods. The items listed above may not be a complete list of recommended foods or beverages. Contact your dietitian for more options.  WHAT FOODS ARE NOT RECOMMENDED? Grains All whole wheat and whole grain breads and crackers. Multigrains, rye, bran seeds, nuts, or coconut. Cereals containing whole grains, multigrains, bran, coconut, nuts, raisins. Cooked or dry oatmeal, steel-cut oats. Coarse wheat cereals, granola. Cereals advertised as high fiber. Potato skins. Whole grain pasta, wild or brown rice. Popcorn. Coconut flour. Bran, buckwheat, corn bread, multigrains, rye, wheat germ.  Vegetables Fresh, cooked or canned vegetables, such as artichokes, asparagus, beet greens, broccoli, Brussels sprouts, cabbage, celery, cauliflower, corn, eggplant, kale, legumes or beans, okra, peas, and tomatoes. Avoid large servings of any vegetables, especially raw vegetables.  Fruits Fresh fruits, such as apples with or without skin, berries, cherries, figs, grapes, grapefruit, guavas, kiwis, mangoes, oranges, papayas, pears, persimmons, pineapple, and pomegranate. Prune juice and juices with pulp, stewed or dried prunes. Dried fruits, dates, raisins. Fruit seeds or skins. Avoid large servings of all fresh fruits. Meats and Other Protein Sources Tough, fibrous meats with gristle. Chunky nut butter. Cheese made with seeds, nuts, or other foods not recommended. Nuts, seeds, legumes (beans, including baked beans), dried peas, beans, lentils.  Dairy Yogurt or cheese that contains nuts, seeds, or added fruit.  Beverages Fruit juices with high pulp, prune juice. Caffeinated coffee and teas.  Condiments Coconut, maple syrup, pickles, olives. Sweets and Desserts Desserts, cookies, or candies that contain nuts or coconut, chunky peanut butter, dried fruits. Jams, preserves with seeds, marmalade. Large amounts of sugar and sweets. Any other dessert made with fruits from the not recommended  list.  Other Soups made from vegetables that are not recommended or that contain other foods not recommended.  The items listed above may not be a complete list of foods and beverages to avoid. Contact your dietitian for more information.   This information is not intended to replace advice given to you by your health care provider. Make sure you discuss any questions you have with your health care provider.   Document Released: 09/04/2001 Document Revised: 03/20/2013 Document Reviewed: 02/05/2013 Elsevier Interactive Patient Education [employer].

## 2015-11-09 NOTE — Progress Notes (Signed)
1600 Discharge instructions given to pt and wife, verbalized understanding. Pt's daughter educated/ demonstrated how to do wound care (dressing changes for abd incision), verbalized understanding. Discharged home accompanied by family.

## 2015-11-09 NOTE — Progress Notes (Signed)
Pharmacy Antibiotic Note  Miguel [name] is a 65 y.o. male admitted on 10/27/2015 with hematemesis and syncope.  Pharmacy has been consulted for vancomycin and Zosyn as empiric therapy for post-op fever and leukocytosis.  Patient's renal function is stable and vancomycin trough is therapeutic.   Plan: - Continue Vanc 1250mg  IV Q12H until discharge - Zosyn 3.375gm IV Q8H, 4 hr infusion - Monitor renal fxn, micro data  Height: 5\' 5"  (165.1 cm) Weight: 171 lb 9.6 oz (77.8 kg) IBW/kg (Calculated) : 61.5  Temp (24hrs), Avg:98.9 F (37.2 C), Min:98.6 F (37 C), Max:99.5 F (37.5 C)   Recent Labs Lab 11/05/15 0530 11/06/15 0453 11/07/15 0939 11/08/15 0507 11/08/15 1238 11/09/15 0308 11/09/15 1130  WBC 18.0* 14.2* 13.3* 9.5 9.8 10.7*  --   CREATININE 0.73 0.83 0.98 0.99  --  1.05  --   VANCOTROUGH  --   --   --   --   --   --  11*    Estimated Creatinine Clearance: 67.5 mL/min (by C-G formula based on SCr of 1.05 mg/dL).    Allergies  Allergen Reactions  . No Known Allergies     Antimicrobials this admission: Vanc 8/9 >> Zosyn 8/9 >>  Dose adjustments this admission: 8/13 VT = 11 mcg/mL on 1250mg  q12 (SCr 1.05)  Microbiology results: 8/8 BCx x2 - NGTD 8/11 abd cx - GPC on Gram stain   Alazar Cherian D. Mina Marble, PharmD, BCPS Pager:  [phone] 11/09/2015, 12:42 PM

## 2015-11-10 LAB — AEROBIC CULTURE W GRAM STAIN (SUPERFICIAL SPECIMEN)

## 2015-11-10 LAB — AEROBIC CULTURE  (SUPERFICIAL SPECIMEN)

## 2015-11-11 ENCOUNTER — Other Ambulatory Visit: Payer: Self-pay | Admitting: *Deleted

## 2015-11-11 NOTE — Patient Outreach (Signed)
[facility]) Care Management  11/11/2015  Miguel [name] 1950/05/31 [id number]   Subjective: Telephone call to patient's home number, no answer, left HIPAA compliant voicemail message, and requested call back.   Objective: Per chart review:  Patient hospitalized  10/27/15 - 11/09/15 for GI bleed, hiatal hernia, acute blood loss anemia, and gastric ulcer.   Patient has a history of diverticulosis with perforation, hyperlipidemia, and chronic kidney disease.    Peripherally Inserted Central Catheter/Midline Placement on 11/03/15.   Patient status post REPAIR OF BLEEDING  ULCER PARTIAL DISTAL GASTRECTOMY WITH BILROTH II RECONSTRUCTION/GASTROJEJUNOSTOMY on 10/30/15.    Ostomy reversal scheduled for  11/29/15.     Assessment: Received [facility] Transition of care referral on 11/03/15.   Transition of care screening / follow up, pending patient contact.   Plan: RNCM will call patient for 2nd telephonic outreach attempt, transition of care follow up, within 10 business days, if no return call.   Chany Woolworth H. Annia Friendly, BSN, [facility] Telephonic CM Phone: [phone] Fax: [phone]

## 2015-11-12 ENCOUNTER — Ambulatory Visit: Payer: Self-pay | Admitting: *Deleted

## 2015-11-12 ENCOUNTER — Other Ambulatory Visit: Payer: Self-pay | Admitting: *Deleted

## 2015-11-12 NOTE — Patient Outreach (Addendum)
[facility]) Care Management  11/12/2015  Miguel [name] 1951-03-19 [id number]  Subjective: Received voicemail message from patient's wife, states she is returning call for patient, and requested call back. Telephone call to patient's mobile number, no answer, left HIPAA voicemail message, and requested call back.  Telephone call to patient's wife mobile number, spoke with wife, states patient had ask for her to return call on his behalf.  Wife states patient is currently at home, recovering from surgery, got out of the hospital on 11/09/15, and does not feel like talking.    RNCM advised wife, would need patient's authorization to discuss the nature of RNCM's call.   Wife voices understanding and states she will ask patient to give RNCM a call.    Objective: Per chart review:  Patient hospitalized  10/27/15 - 11/09/15 for GI bleed, hiatal hernia, acute blood loss anemia, and gastric ulcer.   Patient has a history of diverticulosis with perforation, hyperlipidemia, and chronic kidney disease.    Peripherally Inserted Central Catheter/Midline Placement on 11/03/15.   Patient status post REPAIR OF BLEEDING ULCER PARTIAL DISTAL GASTRECTOMY WITH BILROTH II RECONSTRUCTION/GASTROJEJUNOSTOMY on 10/30/15.    Ostomy reversal scheduled for  11/29/15.     Assessment: Received [facility] Transition of care referral on 11/03/15.   Transition of care screening / follow up, pending patient contact.   Plan: RNCM will call patient for 3rd telephonic outreach attempt, transition of care follow up, within 10 business days, if no return call.   Miguel [name] H. Annia Friendly, BSN, [facility] Telephonic CM Phone: [phone]

## 2015-11-12 NOTE — Patient Outreach (Signed)
[facility]) Care Management  11/12/2015  Miguel [name] [birth year] [id number]   Subjective: Telephone call from patient, states he is doing ok, and hanging in there.  Discussed [facility] Care Management [facility] Transition of care follow up.   Patient gave verbal authorization for RNCM to speak with wife Lakoda Tondre) regarding his healthcare needs as needed. Telephone call to patient's wife mobile number, left HIPAA compliant voicemail message, and requested call back.   Objective: Per chart review: Patient hospitalized 10/27/15 - 11/09/15 for GI bleed, hiatal hernia, acute blood loss anemia, and gastric ulcer. Patient has a history of diverticulosis with perforation, hyperlipidemia, and chronic kidney disease. Peripherally Inserted Central Catheter/Midline Placement on 11/03/15. Patient status post REPAIR OF BLEEDING ULCER PARTIAL DISTAL GASTRECTOMY WITH BILROTH II RECONSTRUCTION/GASTROJEJUNOSTOMY on 10/30/15. Ostomy reversal scheduled for 11/29/15.   Assessment: Received [facility] Transition of care referral on 11/03/15. Transition of care screening / follow up, pending patient contact.   Plan: RNCM will call patient for 3rd telephonic outreach attempt, transition of care follow up, within 10 business days, if no return call.   Anajah Sterbenz H. Annia Friendly, BSN, [facility] Telephonic CM Phone: [phone] Fax: [phone]

## 2015-11-13 ENCOUNTER — Other Ambulatory Visit: Payer: Self-pay | Admitting: *Deleted

## 2015-11-13 ENCOUNTER — Inpatient Hospital Stay (HOSPITAL_COMMUNITY)
Admission: EM | Admit: 2015-11-13 | Discharge: 2015-12-05 | DRG: 326 | Disposition: A | Discharge status: Home Health | Payer: 59 | Attending: Surgery | Admitting: Surgery

## 2015-11-13 ENCOUNTER — Encounter: Payer: Self-pay | Admitting: *Deleted

## 2015-11-13 ENCOUNTER — Ambulatory Visit: Payer: Self-pay | Admitting: *Deleted

## 2015-11-13 ENCOUNTER — Emergency Department (HOSPITAL_COMMUNITY): Payer: 59

## 2015-11-13 ENCOUNTER — Encounter (HOSPITAL_COMMUNITY): Payer: Self-pay

## 2015-11-13 DIAGNOSIS — A419 Sepsis, unspecified organism: Secondary | ICD-10-CM | POA: Diagnosis not present

## 2015-11-13 DIAGNOSIS — Z933 Colostomy status: Secondary | ICD-10-CM

## 2015-11-13 DIAGNOSIS — T8140XA Infection following a procedure, unspecified, initial encounter: Secondary | ICD-10-CM

## 2015-11-13 DIAGNOSIS — J90 Pleural effusion, not elsewhere classified: Secondary | ICD-10-CM | POA: Diagnosis present

## 2015-11-13 DIAGNOSIS — I10 Essential (primary) hypertension: Secondary | ICD-10-CM | POA: Diagnosis present

## 2015-11-13 DIAGNOSIS — R188 Other ascites: Secondary | ICD-10-CM | POA: Diagnosis present

## 2015-11-13 DIAGNOSIS — N202 Calculus of kidney with calculus of ureter: Secondary | ICD-10-CM | POA: Diagnosis present

## 2015-11-13 DIAGNOSIS — R0602 Shortness of breath: Secondary | ICD-10-CM

## 2015-11-13 DIAGNOSIS — R509 Fever, unspecified: Secondary | ICD-10-CM

## 2015-11-13 DIAGNOSIS — D72829 Elevated white blood cell count, unspecified: Secondary | ICD-10-CM

## 2015-11-13 DIAGNOSIS — R109 Unspecified abdominal pain: Secondary | ICD-10-CM | POA: Diagnosis not present

## 2015-11-13 DIAGNOSIS — E43 Unspecified severe protein-calorie malnutrition: Secondary | ICD-10-CM

## 2015-11-13 DIAGNOSIS — R739 Hyperglycemia, unspecified: Secondary | ICD-10-CM | POA: Diagnosis not present

## 2015-11-13 DIAGNOSIS — D649 Anemia, unspecified: Secondary | ICD-10-CM | POA: Diagnosis not present

## 2015-11-13 DIAGNOSIS — J9601 Acute respiratory failure with hypoxia: Secondary | ICD-10-CM | POA: Diagnosis not present

## 2015-11-13 DIAGNOSIS — D473 Essential (hemorrhagic) thrombocythemia: Secondary | ICD-10-CM

## 2015-11-13 DIAGNOSIS — Z7982 Long term (current) use of aspirin: Secondary | ICD-10-CM

## 2015-11-13 DIAGNOSIS — R1013 Epigastric pain: Secondary | ICD-10-CM | POA: Diagnosis not present

## 2015-11-13 DIAGNOSIS — R7989 Other specified abnormal findings of blood chemistry: Secondary | ICD-10-CM

## 2015-11-13 DIAGNOSIS — E876 Hypokalemia: Secondary | ICD-10-CM | POA: Diagnosis present

## 2015-11-13 DIAGNOSIS — N179 Acute kidney failure, unspecified: Secondary | ICD-10-CM | POA: Diagnosis present

## 2015-11-13 DIAGNOSIS — R066 Hiccough: Secondary | ICD-10-CM | POA: Diagnosis present

## 2015-11-13 DIAGNOSIS — Z85820 Personal history of malignant melanoma of skin: Secondary | ICD-10-CM

## 2015-11-13 DIAGNOSIS — R5081 Fever presenting with conditions classified elsewhere: Secondary | ICD-10-CM | POA: Diagnosis not present

## 2015-11-13 DIAGNOSIS — G8918 Other acute postprocedural pain: Secondary | ICD-10-CM

## 2015-11-13 DIAGNOSIS — E860 Dehydration: Secondary | ICD-10-CM

## 2015-11-13 DIAGNOSIS — Z01818 Encounter for other preprocedural examination: Secondary | ICD-10-CM

## 2015-11-13 DIAGNOSIS — J9811 Atelectasis: Secondary | ICD-10-CM | POA: Diagnosis not present

## 2015-11-13 DIAGNOSIS — Z0189 Encounter for other specified special examinations: Secondary | ICD-10-CM

## 2015-11-13 DIAGNOSIS — R5082 Postprocedural fever: Secondary | ICD-10-CM | POA: Diagnosis present

## 2015-11-13 DIAGNOSIS — Z903 Acquired absence of stomach [part of]: Secondary | ICD-10-CM

## 2015-11-13 DIAGNOSIS — R112 Nausea with vomiting, unspecified: Secondary | ICD-10-CM | POA: Diagnosis not present

## 2015-11-13 DIAGNOSIS — Z87891 Personal history of nicotine dependence: Secondary | ICD-10-CM

## 2015-11-13 DIAGNOSIS — D75839 Thrombocytosis, unspecified: Secondary | ICD-10-CM

## 2015-11-13 DIAGNOSIS — B999 Unspecified infectious disease: Secondary | ICD-10-CM

## 2015-11-13 DIAGNOSIS — K440 Diaphragmatic hernia with obstruction, without gangrene: Principal | ICD-10-CM | POA: Diagnosis present

## 2015-11-13 DIAGNOSIS — K5720 Diverticulitis of large intestine with perforation and abscess without bleeding: Secondary | ICD-10-CM | POA: Diagnosis present

## 2015-11-13 DIAGNOSIS — R6520 Severe sepsis without septic shock: Secondary | ICD-10-CM | POA: Diagnosis not present

## 2015-11-13 DIAGNOSIS — R0600 Dyspnea, unspecified: Secondary | ICD-10-CM

## 2015-11-13 DIAGNOSIS — E871 Hypo-osmolality and hyponatremia: Secondary | ICD-10-CM | POA: Diagnosis present

## 2015-11-13 DIAGNOSIS — E785 Hyperlipidemia, unspecified: Secondary | ICD-10-CM | POA: Diagnosis present

## 2015-11-13 DIAGNOSIS — J9600 Acute respiratory failure, unspecified whether with hypoxia or hypercapnia: Secondary | ICD-10-CM

## 2015-11-13 LAB — URINALYSIS, ROUTINE W REFLEX MICROSCOPIC
GLUCOSE, UA: NEGATIVE mg/dL
Hgb urine dipstick: NEGATIVE
Ketones, ur: NEGATIVE mg/dL
LEUKOCYTES UA: NEGATIVE
NITRITE: NEGATIVE
PH: 5.5 (ref 5.0–8.0)
Protein, ur: 100 mg/dL — AB
Specific Gravity, Urine: 1.026 (ref 1.005–1.030)

## 2015-11-13 LAB — CBC WITH DIFFERENTIAL/PLATELET
BASOS ABS: 0 10*3/uL (ref 0.0–0.1)
Basophils Relative: 0 %
EOS PCT: 0 %
Eosinophils Absolute: 0 10*3/uL (ref 0.0–0.7)
HEMATOCRIT: 36.1 % — AB (ref 39.0–52.0)
HEMOGLOBIN: 11.5 g/dL — AB (ref 13.0–17.0)
LYMPHS ABS: 1.5 10*3/uL (ref 0.7–4.0)
LYMPHS PCT: 4 %
MCH: 28.9 pg (ref 26.0–34.0)
MCHC: 31.9 g/dL (ref 30.0–36.0)
MCV: 90.7 fL (ref 78.0–100.0)
MONOS PCT: 5 %
Monocytes Absolute: 1.8 10*3/uL — ABNORMAL HIGH (ref 0.1–1.0)
NEUTROS PCT: 91 %
Neutro Abs: 33.6 10*3/uL — ABNORMAL HIGH (ref 1.7–7.7)
Platelets: 896 10*3/uL — ABNORMAL HIGH (ref 150–400)
RBC: 3.98 MIL/uL — AB (ref 4.22–5.81)
RDW: 15.7 % — ABNORMAL HIGH (ref 11.5–15.5)
WBC: 36.9 10*3/uL — AB (ref 4.0–10.5)

## 2015-11-13 LAB — URINE MICROSCOPIC-ADD ON: RBC / HPF: NONE SEEN RBC/hpf (ref 0–5)

## 2015-11-13 LAB — I-STAT CG4 LACTIC ACID, ED: Lactic Acid, Venous: 2.71 mmol/L (ref 0.5–1.9)

## 2015-11-13 LAB — COMPREHENSIVE METABOLIC PANEL
ALBUMIN: 2.5 g/dL — AB (ref 3.5–5.0)
ALK PHOS: 92 U/L (ref 38–126)
ALT: 15 U/L — ABNORMAL LOW (ref 17–63)
ANION GAP: 13 (ref 5–15)
AST: 15 U/L (ref 15–41)
BUN: 17 mg/dL (ref 6–20)
CALCIUM: 8.4 mg/dL — AB (ref 8.9–10.3)
CHLORIDE: 100 mmol/L — AB (ref 101–111)
CO2: 19 mmol/L — AB (ref 22–32)
Creatinine, Ser: 1.92 mg/dL — ABNORMAL HIGH (ref 0.61–1.24)
GFR calc Af Amer: 41 mL/min — ABNORMAL LOW (ref >60)
GFR calc non Af Amer: 35 mL/min — ABNORMAL LOW (ref >60)
GLUCOSE: 185 mg/dL — AB (ref 65–99)
Potassium: 4.4 mmol/L (ref 3.5–5.1)
SODIUM: 132 mmol/L — AB (ref 135–145)
Total Bilirubin: 0.5 mg/dL (ref 0.3–1.2)
Total Protein: 6.9 g/dL (ref 6.5–8.1)

## 2015-11-13 LAB — LIPASE, BLOOD: Lipase: 44 U/L (ref 11–51)

## 2015-11-13 LAB — I-STAT TROPONIN, ED: TROPONIN I, POC: 0 ng/mL (ref 0.00–0.08)

## 2015-11-13 MED ORDER — SODIUM CHLORIDE 0.9 % IV BOLUS (SEPSIS)
1000.0000 mL | Freq: Once | INTRAVENOUS | Status: AC
  Administered 2015-11-13: 1000 mL via INTRAVENOUS

## 2015-11-13 MED ORDER — PIPERACILLIN-TAZOBACTAM 3.375 G IVPB 30 MIN
3.3750 g | Freq: Once | INTRAVENOUS | Status: AC
  Administered 2015-11-13: 3.375 g via INTRAVENOUS
  Filled 2015-11-13: qty 50

## 2015-11-13 MED ORDER — PIPERACILLIN-TAZOBACTAM 3.375 G IVPB
3.3750 g | Freq: Three times a day (TID) | INTRAVENOUS | Status: AC
  Administered 2015-11-14 – 2015-11-28 (×43): 3.375 g via INTRAVENOUS
  Filled 2015-11-13 (×47): qty 50

## 2015-11-13 MED ORDER — ONDANSETRON HCL 4 MG/2ML IJ SOLN
4.0000 mg | Freq: Once | INTRAMUSCULAR | Status: AC
  Administered 2015-11-13: 4 mg via INTRAVENOUS
  Filled 2015-11-13: qty 2

## 2015-11-13 MED ORDER — SODIUM CHLORIDE 0.9 % IV SOLN
1000.0000 mL | INTRAVENOUS | Status: DC
  Administered 2015-11-13: 1000 mL via INTRAVENOUS

## 2015-11-13 MED ORDER — FENTANYL CITRATE (PF) 100 MCG/2ML IJ SOLN
50.0000 ug | Freq: Once | INTRAMUSCULAR | Status: AC
  Administered 2015-11-13: 50 ug via INTRAVENOUS
  Filled 2015-11-13: qty 2

## 2015-11-13 NOTE — ED Notes (Signed)
Dr. Laverta Baltimore ( EDP ) notified on pt.'s elevated lactic acid result .

## 2015-11-13 NOTE — Progress Notes (Signed)
Pharmacy Antibiotic Note  Miguel [name] is a 65 y.o. male admitted on 11/13/2015 with fever.  Pharmacy has been consulted for Zosyn dosing. Pt recently had ulcer repair/gastrectomy on 8/3. WBC marked up over the last several days (10.7 on 8/13, 36.9 on 8/17). Pt also appears to be in acute renal failure.   Plan: -Zosyn 3.375G IV q8h to be infused over 4 hours -Trend WBC, temp, renal function  -F/u infectious work-up  Height: 5\' 6"  (167.6 cm) Weight: 142 lb (64.4 kg) IBW/kg (Calculated) : 63.8  Temp (24hrs), Avg:98.3 F (36.8 C), Min:98.2 F (36.8 C), Max:98.3 F (36.8 C)   Recent Labs Lab 11/07/15 0939 11/08/15 0507 11/08/15 1238 11/09/15 0308 11/09/15 1130 11/13/15 2159 11/13/15 2203  WBC 13.3* 9.5 9.8 10.7*  --   --  36.9*  CREATININE 0.98 0.99  --  1.05  --   --  1.92*  LATICACIDVEN  --   --   --   --   --  2.71*  --   VANCOTROUGH  --   --   --   --  11*  --   --     Estimated Creatinine Clearance: 34.6 mL/min (by C-G formula based on SCr of 1.92 mg/dL).    Allergies  Allergen Reactions  . No Known Allergies     Miguel [name] 11/13/2015 11:29 PM

## 2015-11-13 NOTE — ED Provider Notes (Signed)
[facility] DEPT Provider Note   CSN: [id number] Arrival date & time: 11/13/15  2120     History   Chief Complaint Chief Complaint  Patient presents with  . Post-op Problem  . Blood Infection    HPI Miguel [name] is a 65 y.o. 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 Hulen Skains, HTN, HLD, nephrolithiasis, and perforated sigmoid diverticulitis s/p colostomy (06/2015), just recently discharged from the hospital on 11/09/15 after his GI bleed and gastrectomy, who presents to the ED with complaints of postoperative fever. Patient's wife provides most of the history, states that 2 days ago he developed nausea, decreased appetite, and felt ill. Today he developed a fever of 101.8, they called the surgeon who told them to push fluids and take Tylenol and come into the ER if he did not improve. Fever improved after 1000 mg Tylenol at 6:30 PM, but he continued to feel ill and began to vomit. He has had 3 episodes of nonbloody nonbilious emesis today, denies coffee-ground emesis or passage of clots. Also reports 5/10 intermittent tightness epigastric abdominal pain, nonradiating, worse with movement, and unrelieved with Tylenol. He also reports hiccups. Additionally he states he feels "a little bit short of breath". He states he had some chest pain earlier but denies any ongoing chest pain. He has been compliant with clindamycin that he was given when he was discharged, states that the incision became infected so they removed the staples and "let it heal on its own". Reports that the incision hasn't had any increasing drainage or redness/warmth.   He denies any ongoing chest pain, wheezing, cough, leg swelling, hematemesis, melena, hematochezia, diarrhea, constipation, dysuria, hematuria, numbness, tingling, or focal weakness. Denies any other associated symptoms.   The history is provided by  the patient, medical records and the spouse. No language interpreter was used.  Abdominal Pain   This is a new problem. The current episode started more than 2 days ago. Episode frequency: intermittently. The problem has not changed since onset.The pain is associated with a previous surgery. The pain is located in the epigastric region. Quality: tightness. The pain is at a severity of 5/10. The pain is moderate. Associated symptoms include fever (Tmax 101.8), nausea and vomiting. Pertinent negatives include diarrhea, flatus, hematochezia, melena, constipation, dysuria, hematuria, arthralgias and myalgias. Exacerbated by: movement. Nothing relieves the symptoms.    Past Medical History:  Diagnosis Date  . Anemia   . Anxiety   . Gastritis   . GI bleed due to NSAIDs 10/27/2015  . Hyperlipidemia   . Hypertension   . Kidney stones   . Melanoma of back ([place])    "mid back"  . Sigmoid diverticulitis    with perforation    Patient Active Problem List   Diagnosis Date Noted  . Fever   . Leg swelling   . Left shoulder pain   . Muscle spasm of left shoulder   . Trapezius muscle spasm   . GI bleed 10/27/2015  . Acute blood loss anemia 10/27/2015  . Syncope 10/27/2015  . Hyperglycemia 10/27/2015  . Hypotension 10/27/2015  . Neck pain 10/27/2015  . Hematemesis 10/27/2015  . Hematochezia 10/27/2015  . Diverticulitis of colon with perforation 07/05/2015    Past Surgical History:  Procedure Laterality Date  . COLON SURGERY    . ESOPHAGOGASTRODUODENOSCOPY N/A 10/27/2015   Procedure: ESOPHAGOGASTRODUODENOSCOPY (EGD);  Surgeon: Clarene Essex, MD;  Location: [facility] ENDOSCOPY;  Service: Endoscopy;  Laterality: N/A;  . ESOPHAGOGASTRODUODENOSCOPY N/A 10/29/2015   Procedure: ESOPHAGOGASTRODUODENOSCOPY (EGD);  Surgeon: Ronald Lobo, MD;  Location: [facility] ENDOSCOPY;  Service: Endoscopy;  Laterality: N/A;  . ESOPHAGOGASTRODUODENOSCOPY N/A 10/30/2015   Procedure: ESOPHAGOGASTRODUODENOSCOPY (EGD);  Surgeon: Clarene Essex, MD;  Location: [facility] ENDOSCOPY;  Service: Endoscopy;  Laterality: N/A;  . HEMORRHOID BANDING  X 2  . KNEE CARTILAGE SURGERY Right 1971   "opened me up"  . LAPAROTOMY N/A 07/05/2015   Procedure: PARTIAL SIGMOID COLECTOMY AND COLOSTOMY;  Surgeon: [name], MD;  Location: [facility];  Service: General;  Laterality: N/A;  . MELANOMA EXCISION  2001  . 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  10/30/2015   Procedure: REMOVAL OF DISTAL STOMACH;  Surgeon: Judeth Horn, MD;  Location: [place];  Service: General;;  . REPAIR OF PERFORATED ULCER N/A 10/30/2015   Procedure: REPAIR OF BLEEDING  ULCER;  Surgeon: Judeth Horn, MD;  Location: [facility];  Service: General;  Laterality: N/A;  . TUMOR EXCISION  2009   "back; fatty tumor"       Home Medications    Prior to Admission medications   Medication Sig Start Date End Date Taking? Authorizing Provider  acetaminophen (TYLENOL) 500 MG tablet Take 1,000 mg by mouth 3 (three) times daily.     Historical Provider, MD  aspirin EC 81 MG tablet Take 81 mg by mouth daily after supper.     Historical Provider, MD  Calcium Polycarbophil (FIBER-LAX PO) Take 2 capsules by mouth 2 (two) times daily as needed (constipation).    Historical Provider, MD  clindamycin (CLEOCIN) 300 MG capsule Take 1 capsule (300 mg total) by mouth 3 (three) times daily. 11/09/15 11/17/15  Elwin Mocha, MD  diazepam (VALIUM) 2 MG tablet Take 1 tablet (2 mg total) by mouth every 6 (six) hours as needed for muscle spasms. Patient taking differently: Take 1 mg by mouth every 6 (six) hours as needed for muscle spasms.  10/11/15   Lacretia Leigh, MD  famotidine (PEPCID) 20 MG tablet Take 1 tablet (20 mg total) by mouth 2 (two) times daily. 11/09/15   Elwin Mocha, MD  ferrous sulfate 325 (65 FE) MG tablet Take 325 mg by mouth daily.    Historical Provider, MD  Multiple Vitamin (MULTIVITAMIN WITH MINERALS) TABS tablet Take 1 tablet by mouth daily after supper.     Historical  Provider, MD  Omega-3 Fatty Acids (FISH OIL) 1200 MG CAPS Take 2,400 mg by mouth daily after supper.     Historical Provider, MD  ondansetron (ZOFRAN) 4 MG tablet Take 4 mg by mouth every 8 (eight) hours as needed for nausea or vomiting.    Historical Provider, MD  oxyCODONE-acetaminophen (PERCOCET/ROXICET) 5-325 MG tablet Take 1-2 tablets by mouth every 4 (four) hours as needed for moderate pain. 11/09/15   Elwin Mocha, MD  polyethylene glycol [facility] / Floria Raveling) packet Take 8.5-17 g by mouth 2 (two) times daily as needed (constipation). Mix in 8 oz liquid and drink    Historical Provider, MD  zolpidem (AMBIEN) 10 MG tablet Take 5 mg by mouth See admin instructions. Take 1/2 tablet (5 mg) by mouth daily at bedtime, may also take another 1/2 tablet if needed during the night    Historical Provider, MD    Family History Family History  Problem Relation Age of Onset  . Stroke Mother   . Stroke Brother   . Heart disease Brother  Social History Social History  Substance Use Topics  . Smoking status: Never Smoker  . Smokeless tobacco: Former Systems developer    Types: Snuff    Quit date: 07/14/2015  . Alcohol use No     Allergies   No known allergies   Review of Systems Review of Systems  Constitutional: Positive for appetite change, fatigue and fever (Tmax 101.8).  Respiratory: Positive for shortness of breath ("a little bit"). Negative for cough and wheezing.   Cardiovascular: Negative for chest pain and leg swelling.  Gastrointestinal: Positive for abdominal pain, nausea and vomiting. Negative for blood in stool, constipation, diarrhea, flatus, hematochezia and melena.       +hiccups  Genitourinary: Negative for dysuria and hematuria.  Musculoskeletal: Negative for arthralgias and myalgias.  Skin: Negative for color change.  Allergic/Immunologic: Negative for immunocompromised state.  Neurological: Negative for weakness and numbness.  Psychiatric/Behavioral: Negative for confusion.    10 Systems reviewed and are negative for acute change except as noted in the HPI.   Physical Exam Updated Vital Signs BP 104/72 (BP Location: Left Arm)   Pulse 105   Temp 98.3 F (36.8 C) (Oral)   Resp 18   Ht 5\' 6"  (1.676 m)   Wt 64.4 kg   SpO2 99%   BMI 22.92 kg/m   Physical Exam  Constitutional: He is oriented to person, place, and time. He appears well-developed.  Non-toxic appearance. He appears ill. No distress.  Afebrile, nontoxic, ill appearing although in NAD; mildly tachycardic  HENT:  Head: Normocephalic and atraumatic.  Mouth/Throat: Oropharynx is clear and moist. Mucous membranes are dry.  Dry mucous membranes  Eyes: Conjunctivae and EOM are normal. Right eye exhibits no discharge. Left eye exhibits no discharge.  Neck: Normal range of motion. Neck supple.  Cardiovascular: Regular rhythm, normal heart sounds and intact distal pulses.  Tachycardia present.  Exam reveals no gallop and no friction rub.   No murmur heard. Tachycardic, reg rhythm, nl s1/s2, no m/r/g, distal pulses intact, no pedal edema  Pulmonary/Chest: Effort normal and breath sounds normal. No respiratory distress. He has no decreased breath sounds. He has no wheezes. He has no rhonchi. He has no rales.  CTAB in all lung fields, no w/r/r, no hypoxia or increased WOB, SpO2 99% on RA   Abdominal: Soft. He exhibits no distension. Bowel sounds are decreased. There is generalized tenderness. There is guarding (mild voluntary). There is no rigidity, no rebound, no CVA tenderness, no tenderness at McBurney's point and negative Murphy's sign.    Soft, nondistended, hypoactive bowel sounds throughout, with midline incision that is dehisced (stable per pt), no surrounding erythema or warmth, no purulent drainage. Mildly TTP diffusely in all regions of abdomen. LLQ colostomy bag with gas and scant stool. Mild voluntary guarding, no rigidity or rebound tenderness. Neg murphy's and mcburney's, no CVA TTP    Musculoskeletal: Normal range of motion.  Neurological: He is alert and oriented to person, place, and time. He has normal strength. No sensory deficit.  Skin: Skin is warm and dry. No rash noted.  Midline abdominal incision as mentioned above  Psychiatric: He has a normal mood and affect.  Nursing note and vitals reviewed.    ED Treatments / Results  Labs (all labs ordered are listed, but only abnormal results are displayed) Labs Reviewed  COMPREHENSIVE METABOLIC PANEL - Abnormal; Notable for the following:       Result Value   Sodium 132 (*)    Chloride 100 (*)  CO2 19 (*)    Glucose, Bld 185 (*)    Creatinine, Ser 1.92 (*)    Calcium 8.4 (*)    Albumin 2.5 (*)    ALT 15 (*)    GFR calc non Af Amer 35 (*)    GFR calc Af Amer 41 (*)    All other components within normal limits  CBC WITH DIFFERENTIAL/PLATELET - Abnormal; Notable for the following:    WBC 36.9 (*)    RBC 3.98 (*)    Hemoglobin 11.5 (*)    HCT 36.1 (*)    RDW 15.7 (*)    Platelets 896 (*)    Neutro Abs 33.6 (*)    Monocytes Absolute 1.8 (*)    All other components within normal limits  URINALYSIS, ROUTINE W REFLEX MICROSCOPIC (NOT AT [facility]) - Abnormal; Notable for the following:    Color, Urine AMBER (*)    APPearance CLOUDY (*)    Bilirubin Urine SMALL (*)    Protein, ur 100 (*)    All other components within normal limits  URINE MICROSCOPIC-ADD ON - Abnormal; Notable for the following:    Squamous Epithelial / LPF 0-5 (*)    Bacteria, UA RARE (*)    Casts HYALINE CASTS (*)    All other components within normal limits  I-STAT CG4 LACTIC ACID, ED - Abnormal; Notable for the following:    Lactic Acid, Venous 2.71 (*)    All other components within normal limits  CULTURE, BLOOD (ROUTINE X 2)  CULTURE, BLOOD (ROUTINE X 2)  URINE CULTURE  LIPASE, BLOOD  I-STAT TROPOININ, ED  I-STAT CG4 LACTIC ACID, ED    EKG  EKG Interpretation  Date/Time:  Friday November 14 2015 00:03:38 EDT Ventricular  Rate:  107 PR Interval:    QRS Duration: 91 QT Interval:  349 QTC Calculation: 466 R Axis:   -70 Text Interpretation:  Sinus tachycardia Probable left atrial enlargement Left anterior fascicular block RSR' in V1 or V2, probably normal variant No STEMI.  Confirmed by LONG MD, JOSHUA [phone]) on 11/14/2015 12:09:28 AM       Radiology Dg Chest 2 View  Result Date: 11/13/2015 CLINICAL DATA:  Shortness of breath and fever today, colostomy, constipation, ulcer surgery last week EXAM: CHEST  2 VIEW COMPARISON:  11/05/2015 FINDINGS: Normal heart size and pulmonary vascularity. Atherosclerotic calcification aorta. Large hiatal hernia with suspected bowel loop within hernia sac. Bibasilar atelectasis and small pleural effusions. Upper lungs clear with underlying emphysematous no pneumothorax. Bones demineralized. IMPRESSION: Hiatal hernia with suspected paraesophageal bowel herniation. Bibasilar at pleural effusions and atelectasis. Aortic atherosclerosis. Electronically Signed   By: Lavonia Dana M.D.   On: 11/13/2015 22:06    Procedures Procedures (including critical care time)  CRITICAL CARE-sepsis Performed by: Corine Shelter   Total critical care time: 35 minutes  Critical care time was exclusive of separately billable procedures and treating other patients.  Critical care was necessary to treat or prevent imminent or life-threatening deterioration.  Critical care was time spent personally by me on the following activities: development of treatment plan with patient and/or surrogate as well as nursing, discussions with consultants, evaluation of patient's response to treatment, examination of patient, obtaining history from patient or surrogate, ordering and performing treatments and interventions, ordering and review of laboratory studies, ordering and review of radiographic studies, pulse oximetry and re-evaluation of patient's condition.   Medications Ordered in  ED Medications  0.9 %  sodium chloride infusion (1,000 mLs Intravenous New  Bag/Given 11/13/15 2351)  piperacillin-tazobactam (ZOSYN) IVPB 3.375 g (not administered)  diatrizoate meglumine-sodium (GASTROGRAFIN) 66-10 % solution (not administered)  ondansetron (ZOFRAN) injection 4 mg (4 mg Intravenous Given 11/13/15 2352)  fentaNYL (SUBLIMAZE) injection 50 mcg (50 mcg Intravenous Given 11/13/15 2352)  sodium chloride 0.9 % bolus 1,000 mL (1,000 mLs Intravenous New Bag/Given 11/13/15 2352)    And  sodium chloride 0.9 % bolus 1,000 mL (1,000 mLs Intravenous New Bag/Given 11/13/15 2352)  piperacillin-tazobactam (ZOSYN) IVPB 3.375 g (3.375 g Intravenous New Bag/Given 11/13/15 2354)     Initial Impression / Assessment and Plan / ED Course  I have reviewed the triage vital signs and the nursing notes.  Pertinent labs & imaging results that were available during my care of the patient were reviewed by me and considered in my medical decision making (see chart for details).  Clinical Course    65 y.o. male here with post op fever, n/v, abd pain, and hiccups. Fever 101.8 at home. Took tylenol PTA. On exam, frail appearing, tachycardic, dry mucous membranes, with mildly hypoactive bowel sounds throughout and moderate abdominal tenderness generalized throughout, midline wound with granulation tissue, no drainage, no surrounding erythema or warmth. Colostomy in LLQ, gas and stool in bag. Labs so far showing CBC w/diff with marked neutrophilic leukocytosis and stable anemia, elevated Plt likely acute phase reactant. CMP with Cr 1.92 which is new, CO2 19, Na 132, gluc 185. Lactic 2.71. CXR with hiatal hernia with paraesophageal bowel herniation. U/A without evidence of infection. Will add-on EKG, trop, and lipase. Will proceed with consultation to surgical service, and admission, hold off on CT abd/pelv for now until I speak to surgical team. Code sepsis called, weight based fluid resuscitation started, fentanyl and  zofran given, empiric abx started, cultures drawn and sent.  Discussed case with my attending Dr. Christy Gentles who agrees with plan.   11:43 PM Trop neg. Lipase WNL. EKG not yet done, awaiting this to be done. Dr. Ninfa Linden of [facility] returning page, wants CT chest/abd/pelv to see where infection is coming from; admit to medicine. Will order these now, will have to be without contrast since his GFR won't allow for IV contrast. Will give PO contrast too.  12:57 AM EKG without acute ischemic findings/significant changes. Repeat lactic 1.74 trending down with fluids. CT's not yet done. Care signed over to [facility] PA-C at shift change, who will f/up with CT's and admit to [facility], surgery team on board and aware of patient. HR and BP improving with fluids. Pt stable at this time, please see Shari's notes for further documentation of care/admission.  Final Clinical Impressions(s) / ED Diagnoses   Final diagnoses:  Post-operative infection  Epigastric pain  Other specified fever  Leukocytosis  Anemia, unspecified anemia type  AKI (acute kidney injury) ([place])  Elevated lactic acid level  Thrombocytosis (HCC)  Sepsis, due to unspecified organism (HCC)  Nausea and vomiting in adult patient    New Prescriptions New Prescriptions   No medications on file     [place], PA-C 11/14/15 0057    Ripley Fraise, MD 11/14/15 2329

## 2015-11-13 NOTE — Patient Outreach (Signed)
[facility]) Care Management  11/13/2015  [address] [birth year] [id number]  Subjective: Telephone call from patient's wife, spoke with wife, and HIPAA verified.  Discussed [facility] Care Management UMR Transition of care follow up and wife in agreement to complete  Follow up screening.  Wife states patient is not feeling the best today or yesterday.   States he ate something for dinner that did not agree with him.   Wife states she is aware of signs, symptoms, issues, and when to contact MD.  States patient has complained of being cold and attributes it to patient's blood loss.   RNCM educated wife on signs and symptoms of elevated temperature.   Wife voices understanding and states she will start to monitor patient's temperature.   States patient has a follow up appointment with primary MD on 11/14/15 and with surgeon on 11/28/15.  Patient is receiving home health physical therapy through [facility].    Patient is able to eat and does not require total parenteral nutrition.   Patient ambulating short distances without assistive device.  States she is able to assist patient with wound care and colostomy care.  States patient's colostomy reversal has been postponed for several more weeks.  Wife states they do not currently use [facility] outpatient pharmacy but will look into transferring medications to one the pharmacies.  Patient currently able to afford medications.  Wife states patient does not have any transition of care, care coordination, disease management, disease monitoring, disease education, community resource, transportation, or pharmacy needs at this time.   Wife in agreement for patient to receive [facility] Care Management information for future use.     Objective: Per chart review: Patient hospitalized 10/27/15 - 11/09/15 for GI bleed, hiatal hernia, acute blood loss anemia, and gastric ulcer. Patient has a history of diverticulosis with perforation, hyperlipidemia, and chronic  kidney disease. Peripherally Inserted Central Catheter/Midline Placement on 11/03/15. Patient status post REPAIR OF BLEEDING ULCER PARTIAL DISTAL GASTRECTOMY WITH BILROTH II RECONSTRUCTION/GASTROJEJUNOSTOMY on 10/30/15. Ostomy reversal scheduled for 11/29/15.   Assessment: Received [facility]MR Transition of care referral on 11/03/15. Transition of care screening / follow up completed, no care management needs identified.   No Telephonic RNCM needs at this time.    Plan: RNCM will send patient successful outreach letter, [facility] pamphlet, and magnet.  RNCM will send case closure due to follow up completed / no care management needs to [facility] Management.     Felma Pfefferle H. Annia Friendly, BSN, [facility] Telephonic CM Phone: [phone] Fax: [phone]

## 2015-11-13 NOTE — ED Triage Notes (Signed)
Pt recently recently had stomach surgery, recently left hospital on Sunday, has had fevers for the past two days. On antibiotics at home, called surgeon and advised to come here.

## 2015-11-13 NOTE — ED Provider Notes (Signed)
Patient seen/examined in the Emergency Department in conjunction with Midlevel Provider  Patient reports diffuse abd pain/vomiting/fever Exam : awake/alert, diffuse abdominal tenderness. He has colostomy in place and surgical wound is noted Plan: surgical consult.  Ct imaging planned Code sepsis has been called     Ripley Fraise, MD 11/13/15 2340

## 2015-11-14 ENCOUNTER — Other Ambulatory Visit: Payer: Self-pay

## 2015-11-14 ENCOUNTER — Encounter (HOSPITAL_COMMUNITY): Payer: Self-pay | Admitting: Internal Medicine

## 2015-11-14 ENCOUNTER — Emergency Department (HOSPITAL_COMMUNITY): Payer: 59

## 2015-11-14 DIAGNOSIS — R188 Other ascites: Secondary | ICD-10-CM | POA: Diagnosis not present

## 2015-11-14 DIAGNOSIS — J9601 Acute respiratory failure with hypoxia: Secondary | ICD-10-CM | POA: Diagnosis not present

## 2015-11-14 DIAGNOSIS — J9811 Atelectasis: Secondary | ICD-10-CM | POA: Diagnosis not present

## 2015-11-14 DIAGNOSIS — D72829 Elevated white blood cell count, unspecified: Secondary | ICD-10-CM | POA: Diagnosis not present

## 2015-11-14 DIAGNOSIS — R109 Unspecified abdominal pain: Secondary | ICD-10-CM | POA: Diagnosis present

## 2015-11-14 DIAGNOSIS — J939 Pneumothorax, unspecified: Secondary | ICD-10-CM | POA: Diagnosis not present

## 2015-11-14 DIAGNOSIS — J9383 Other pneumothorax: Secondary | ICD-10-CM | POA: Diagnosis not present

## 2015-11-14 DIAGNOSIS — K5720 Diverticulitis of large intestine with perforation and abscess without bleeding: Secondary | ICD-10-CM | POA: Diagnosis present

## 2015-11-14 DIAGNOSIS — D649 Anemia, unspecified: Secondary | ICD-10-CM | POA: Diagnosis not present

## 2015-11-14 DIAGNOSIS — R0602 Shortness of breath: Secondary | ICD-10-CM | POA: Diagnosis not present

## 2015-11-14 DIAGNOSIS — E871 Hypo-osmolality and hyponatremia: Secondary | ICD-10-CM | POA: Diagnosis present

## 2015-11-14 DIAGNOSIS — R5082 Postprocedural fever: Secondary | ICD-10-CM | POA: Diagnosis not present

## 2015-11-14 DIAGNOSIS — K658 Other peritonitis: Secondary | ICD-10-CM | POA: Diagnosis not present

## 2015-11-14 DIAGNOSIS — N179 Acute kidney failure, unspecified: Secondary | ICD-10-CM

## 2015-11-14 DIAGNOSIS — E46 Unspecified protein-calorie malnutrition: Secondary | ICD-10-CM | POA: Diagnosis not present

## 2015-11-14 DIAGNOSIS — B9562 Methicillin resistant Staphylococcus aureus infection as the cause of diseases classified elsewhere: Secondary | ICD-10-CM | POA: Diagnosis not present

## 2015-11-14 DIAGNOSIS — J9600 Acute respiratory failure, unspecified whether with hypoxia or hypercapnia: Secondary | ICD-10-CM | POA: Diagnosis not present

## 2015-11-14 DIAGNOSIS — Z4682 Encounter for fitting and adjustment of non-vascular catheter: Secondary | ICD-10-CM | POA: Diagnosis not present

## 2015-11-14 DIAGNOSIS — K631 Perforation of intestine (nontraumatic): Secondary | ICD-10-CM | POA: Diagnosis not present

## 2015-11-14 DIAGNOSIS — Z85820 Personal history of malignant melanoma of skin: Secondary | ICD-10-CM | POA: Diagnosis not present

## 2015-11-14 DIAGNOSIS — Z87891 Personal history of nicotine dependence: Secondary | ICD-10-CM | POA: Diagnosis not present

## 2015-11-14 DIAGNOSIS — E785 Hyperlipidemia, unspecified: Secondary | ICD-10-CM | POA: Diagnosis present

## 2015-11-14 DIAGNOSIS — R066 Hiccough: Secondary | ICD-10-CM

## 2015-11-14 DIAGNOSIS — E876 Hypokalemia: Secondary | ICD-10-CM | POA: Diagnosis present

## 2015-11-14 DIAGNOSIS — N201 Calculus of ureter: Secondary | ICD-10-CM | POA: Diagnosis not present

## 2015-11-14 DIAGNOSIS — N202 Calculus of kidney with calculus of ureter: Secondary | ICD-10-CM | POA: Diagnosis present

## 2015-11-14 DIAGNOSIS — K449 Diaphragmatic hernia without obstruction or gangrene: Secondary | ICD-10-CM | POA: Diagnosis not present

## 2015-11-14 DIAGNOSIS — A419 Sepsis, unspecified organism: Secondary | ICD-10-CM | POA: Diagnosis not present

## 2015-11-14 DIAGNOSIS — R739 Hyperglycemia, unspecified: Secondary | ICD-10-CM | POA: Diagnosis not present

## 2015-11-14 DIAGNOSIS — J810 Acute pulmonary edema: Secondary | ICD-10-CM | POA: Diagnosis not present

## 2015-11-14 DIAGNOSIS — K659 Peritonitis, unspecified: Secondary | ICD-10-CM | POA: Diagnosis not present

## 2015-11-14 DIAGNOSIS — Z933 Colostomy status: Secondary | ICD-10-CM | POA: Diagnosis not present

## 2015-11-14 DIAGNOSIS — E43 Unspecified severe protein-calorie malnutrition: Secondary | ICD-10-CM | POA: Diagnosis not present

## 2015-11-14 DIAGNOSIS — Z7982 Long term (current) use of aspirin: Secondary | ICD-10-CM | POA: Diagnosis not present

## 2015-11-14 DIAGNOSIS — J90 Pleural effusion, not elsewhere classified: Secondary | ICD-10-CM | POA: Diagnosis not present

## 2015-11-14 DIAGNOSIS — K440 Diaphragmatic hernia with obstruction, without gangrene: Secondary | ICD-10-CM | POA: Diagnosis not present

## 2015-11-14 DIAGNOSIS — R509 Fever, unspecified: Secondary | ICD-10-CM | POA: Diagnosis not present

## 2015-11-14 DIAGNOSIS — Z903 Acquired absence of stomach [part of]: Secondary | ICD-10-CM | POA: Diagnosis not present

## 2015-11-14 DIAGNOSIS — I10 Essential (primary) hypertension: Secondary | ICD-10-CM | POA: Diagnosis present

## 2015-11-14 DIAGNOSIS — R6520 Severe sepsis without septic shock: Secondary | ICD-10-CM | POA: Diagnosis present

## 2015-11-14 LAB — CBC
HCT: 27.7 % — ABNORMAL LOW (ref 39.0–52.0)
HEMOGLOBIN: 8.8 g/dL — AB (ref 13.0–17.0)
MCH: 28.7 pg (ref 26.0–34.0)
MCHC: 31.8 g/dL (ref 30.0–36.0)
MCV: 90.2 fL (ref 78.0–100.0)
Platelets: 675 10*3/uL — ABNORMAL HIGH (ref 150–400)
RBC: 3.07 MIL/uL — AB (ref 4.22–5.81)
RDW: 16 % — ABNORMAL HIGH (ref 11.5–15.5)
WBC: 29.8 10*3/uL — ABNORMAL HIGH (ref 4.0–10.5)

## 2015-11-14 LAB — BASIC METABOLIC PANEL
ANION GAP: 7 (ref 5–15)
BUN: 21 mg/dL — ABNORMAL HIGH (ref 6–20)
CHLORIDE: 104 mmol/L (ref 101–111)
CO2: 23 mmol/L (ref 22–32)
CREATININE: 1.38 mg/dL — AB (ref 0.61–1.24)
Calcium: 7.6 mg/dL — ABNORMAL LOW (ref 8.9–10.3)
GFR calc non Af Amer: 52 mL/min — ABNORMAL LOW (ref >60)
Glucose, Bld: 134 mg/dL — ABNORMAL HIGH (ref 65–99)
Potassium: 4.5 mmol/L (ref 3.5–5.1)
Sodium: 134 mmol/L — ABNORMAL LOW (ref 135–145)

## 2015-11-14 LAB — I-STAT CG4 LACTIC ACID, ED: LACTIC ACID, VENOUS: 1.74 mmol/L (ref 0.5–1.9)

## 2015-11-14 LAB — MRSA PCR SCREENING: MRSA by PCR: NEGATIVE

## 2015-11-14 MED ORDER — ENOXAPARIN SODIUM 40 MG/0.4ML ~~LOC~~ SOLN
40.0000 mg | SUBCUTANEOUS | Status: DC
  Administered 2015-11-15 – 2015-12-04 (×19): 40 mg via SUBCUTANEOUS
  Filled 2015-11-14 (×19): qty 0.4

## 2015-11-14 MED ORDER — ENSURE ENLIVE PO LIQD: 237.0000 mL | Freq: Two times a day (BID) | ORAL | Status: DC

## 2015-11-14 MED ORDER — SODIUM CHLORIDE 0.9 % IV BOLUS (SEPSIS)
1000.0000 mL | Freq: Once | INTRAVENOUS | Status: AC
  Administered 2015-11-14: 1000 mL via INTRAVENOUS

## 2015-11-14 MED ORDER — DIATRIZOATE MEGLUMINE & SODIUM 66-10 % PO SOLN
ORAL | Status: AC
  Filled 2015-11-14: qty 30

## 2015-11-14 MED ORDER — FAMOTIDINE IN NACL 20-0.9 MG/50ML-% IV SOLN
20.0000 mg | Freq: Two times a day (BID) | INTRAVENOUS | Status: AC
Start: 2015-11-14 — End: 2015-11-16
  Administered 2015-11-14 – 2015-11-16 (×5): 20 mg via INTRAVENOUS
  Filled 2015-11-14 (×5): qty 50

## 2015-11-14 MED ORDER — POTASSIUM CHLORIDE IN NACL 20-0.9 MEQ/L-% IV SOLN
INTRAVENOUS | Status: DC
  Administered 2015-11-14 – 2015-11-16 (×5): via INTRAVENOUS
  Filled 2015-11-14 (×5): qty 1000

## 2015-11-14 MED ORDER — ONDANSETRON 4 MG PO TBDP
4.0000 mg | ORAL_TABLET | Freq: Four times a day (QID) | ORAL | Status: DC | PRN
  Administered 2015-11-15: 4 mg via ORAL
  Filled 2015-11-14 (×2): qty 1

## 2015-11-14 MED ORDER — HYDROMORPHONE HCL 1 MG/ML IJ SOLN
1.0000 mg | INTRAMUSCULAR | Status: DC | PRN
  Administered 2015-11-14 – 2015-11-20 (×50): 1 mg via INTRAVENOUS
  Filled 2015-11-14 (×50): qty 1

## 2015-11-14 MED ORDER — ONDANSETRON HCL 4 MG/2ML IJ SOLN
4.0000 mg | Freq: Four times a day (QID) | INTRAMUSCULAR | Status: DC | PRN
  Administered 2015-11-14 – 2015-11-20 (×4): 4 mg via INTRAVENOUS
  Filled 2015-11-14 (×4): qty 2

## 2015-11-14 MED ORDER — FENTANYL CITRATE (PF) 100 MCG/2ML IJ SOLN
50.0000 ug | Freq: Once | INTRAMUSCULAR | Status: AC
  Administered 2015-11-14: 50 ug via INTRAVENOUS
  Filled 2015-11-14: qty 2

## 2015-11-14 MED ORDER — ACETAMINOPHEN 325 MG PO TABS
650.0000 mg | ORAL_TABLET | Freq: Once | ORAL | Status: AC
  Administered 2015-11-14: 650 mg via ORAL
  Filled 2015-11-14: qty 2

## 2015-11-14 NOTE — ED Provider Notes (Signed)
CT FINDINGS D/W DR [facility] HE REQUESTS TO KEEP PATIENT NPO AND ADMIT TO [facility] PATIENT ADMITTED TO MEDICINE, D/W DR YATES BP 103/62   Pulse 112   Temp 98.3 F (36.8 C) (Oral)   Resp 21   Ht 5\' 6"  (1.676 m)   Wt 64.4 kg   SpO2 97%   BMI 22.92 kg/m  PT/FAMILY UPDATED PT STABILIZED AND IMPROVED IN THE ED    Ripley Fraise, MD 11/14/15 [phone]

## 2015-11-14 NOTE — ED Notes (Signed)
Pt requested to ambulate, ambulated to bathroom and around department, gait was steady, pt tolerated well.

## 2015-11-14 NOTE — ED Notes (Signed)
Pt given a cup of ice, per Humberto Seals, Therapist, sports.

## 2015-11-14 NOTE — ED Provider Notes (Signed)
After seen by dr Lorin Mercy, she has spoken to surgery and the surgical team will now admit patient    Miguel Fraise, MD 11/14/15 [phone]

## 2015-11-14 NOTE — ED Notes (Signed)
Ice chips given, pt began to have nausea.

## 2015-11-14 NOTE — ED Notes (Signed)
Attempted report x1. 

## 2015-11-14 NOTE — ED Provider Notes (Signed)
Patient care signed out at end of shift by [facility], PA-C  Gastric ulcer surgery on 3rd, gastrectomy D/c Sunday, 4 days ago On abx for post-op wound infec. 2 days after d/c nausea, feeling unwell Today with fever, vomiting, abd pain Tylenol per surgery recommendation and if no better come in for eval  Per Dr. Ninfa Linden - get CT's, admit to medicine ([facility]) Patient remains comfortable during studies. Admitted to [place], Dr. Lorin Mercy.      Charlann Lange, PA-C 11/26/15 0425    Ripley Fraise, MD 11/26/15 [phone]

## 2015-11-14 NOTE — ED Notes (Signed)
Admitting at bedside 

## 2015-11-14 NOTE — ED Notes (Signed)
Wet to dry dressing performed using sterile technique.  Mid-abdominal wound had no drainage or foul odor noted, no redness or swelling noted around edges.  Pt tolerated well.

## 2015-11-14 NOTE — H&P (Signed)
Miguel [name] is an 65 y.o. male.   Chief Complaint: Fatigue weakness and fever HPI: This gentleman is known to [facility].  He is status post an antrectomy with B2 anastomosis on August 3 for bleeding ulcer. He has had a previous colostomy for perforated diverticulitis. He was discharged home approximate 4 days ago. He has had increasing fatigue, abdominal discomfort, nausea, and fever over the past 2 days. He has several episodes of emesis yesterday before coming to the emergency department last evening. He reports no ostomy output for several days. He has some mild discomfort in his back and flank with moderate discomfort in the epigastrium.  Past Medical History:  Diagnosis Date  . Anemia   . Anxiety   . Gastritis   . GI bleed due to NSAIDs 10/27/2015  . Hyperlipidemia   . Hypertension   . Kidney stones   . Melanoma of back ([place])    "mid back"  . Sigmoid diverticulitis    with perforation    Past Surgical History:  Procedure Laterality Date  . COLON SURGERY    . ESOPHAGOGASTRODUODENOSCOPY N/A 10/27/2015   Procedure: ESOPHAGOGASTRODUODENOSCOPY (EGD);  Surgeon: Clarene Essex, MD;  Location: [facility] ENDOSCOPY;  Service: Endoscopy;  Laterality: N/A;  . ESOPHAGOGASTRODUODENOSCOPY N/A 10/29/2015   Procedure: ESOPHAGOGASTRODUODENOSCOPY (EGD);  Surgeon: Ronald Lobo, MD;  Location: [facility] ENDOSCOPY;  Service: Endoscopy;  Laterality: N/A;  . ESOPHAGOGASTRODUODENOSCOPY N/A 10/30/2015   Procedure: ESOPHAGOGASTRODUODENOSCOPY (EGD);  Surgeon: Clarene Essex, MD;  Location: [facility] ENDOSCOPY;  Service: Endoscopy;  Laterality: N/A;  . HEMORRHOID BANDING  X 2  . KNEE CARTILAGE SURGERY Right 1971   "opened me up"  . LAPAROTOMY N/A 07/05/2015   Procedure: PARTIAL SIGMOID COLECTOMY AND COLOSTOMY;  Surgeon: [name], MD;  Location: [place];  Service: General;  Laterality: N/A;  . MELANOMA EXCISION  2001  . 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 REPAIR OF PERFORATED ULCER N/A 10/30/2015   Procedure: REPAIR OF BLEEDING  ULCER;  Surgeon: Judeth Horn, MD;  Location: [place];  Service: General;  Laterality: N/A;  . TUMOR EXCISION  2009   "back; fatty tumor"    Family History  Problem Relation Age of Onset  . Stroke Mother   . Stroke Brother   . Heart disease Brother    Social History:  reports that he has never smoked. He quit smokeless tobacco use about 4 months ago. His smokeless tobacco use included Snuff. He reports that he does not drink alcohol or use drugs.  Allergies:  Allergies  Allergen Reactions  . No Known Allergies      (Not in a hospital admission)  Results for orders placed or performed during the hospital encounter of 11/13/15 (from the past 48 hour(s))  Urinalysis, Routine w reflex microscopic     Status: Abnormal   Collection Time: 11/13/15  9:37 PM  Result Value Ref Range   Color, Urine AMBER (A) YELLOW    Comment: BIOCHEMICALS MAY BE AFFECTED BY COLOR   APPearance CLOUDY (A) CLEAR   Specific Gravity, Urine 1.026 1.005 - 1.030   pH 5.5 5.0 - 8.0   Glucose, UA NEGATIVE NEGATIVE mg/dL   Hgb urine dipstick NEGATIVE NEGATIVE   Bilirubin Urine SMALL (A) NEGATIVE   Ketones, ur NEGATIVE NEGATIVE mg/dL   Protein, ur 100 (A) NEGATIVE mg/dL   Nitrite NEGATIVE NEGATIVE   Leukocytes,  UA NEGATIVE NEGATIVE  Urine microscopic-add on     Status: Abnormal   Collection Time: 11/13/15  9:37 PM  Result Value Ref Range   Squamous Epithelial / LPF 0-5 (A) NONE SEEN   WBC, UA 0-5 0 - 5 WBC/hpf   RBC / HPF NONE SEEN 0 - 5 RBC/hpf   Bacteria, UA RARE (A) NONE SEEN   Casts HYALINE CASTS (A) NEGATIVE  I-Stat CG4 Lactic Acid, ED     Status: Abnormal   Collection Time: 11/13/15  9:59 PM  Result Value Ref Range   Lactic Acid, Venous 2.71 (HH) 0.5 - 1.9 mmol/L   Comment NOTIFIED PHYSICIAN   Comprehensive metabolic panel     Status: Abnormal   Collection Time:  11/13/15 10:03 PM  Result Value Ref Range   Sodium 132 (L) 135 - 145 mmol/L   Potassium 4.4 3.5 - 5.1 mmol/L   Chloride 100 (L) 101 - 111 mmol/L   CO2 19 (L) 22 - 32 mmol/L   Glucose, Bld 185 (H) 65 - 99 mg/dL   BUN 17 6 - 20 mg/dL   Creatinine, Ser 1.92 (H) 0.61 - 1.24 mg/dL   Calcium 8.4 (L) 8.9 - 10.3 mg/dL   Total Protein 6.9 6.5 - 8.1 g/dL   Albumin 2.5 (L) 3.5 - 5.0 g/dL   AST 15 15 - 41 U/L   ALT 15 (L) 17 - 63 U/L   Alkaline Phosphatase 92 38 - 126 U/L   Total Bilirubin 0.5 0.3 - 1.2 mg/dL   GFR calc non Af Amer 35 (L) >60 mL/min   GFR calc Af Amer 41 (L) >60 mL/min    Comment: (NOTE) The eGFR has been calculated using the CKD EPI equation. This calculation has not been validated in all clinical situations. eGFR's persistently <60 mL/min signify possible Chronic Kidney Disease.    Anion gap 13 5 - 15  CBC with Differential     Status: Abnormal   Collection Time: 11/13/15 10:03 PM  Result Value Ref Range   WBC 36.9 (H) 4.0 - 10.5 K/uL   RBC 3.98 (L) 4.22 - 5.81 MIL/uL   Hemoglobin 11.5 (L) 13.0 - 17.0 g/dL   HCT 36.1 (L) 39.0 - 52.0 %   MCV 90.7 78.0 - 100.0 fL   MCH 28.9 26.0 - 34.0 pg   MCHC 31.9 30.0 - 36.0 g/dL   RDW 15.7 (H) 11.5 - 15.5 %   Platelets 896 (H) 150 - 400 K/uL   Neutrophils Relative % 91 %   Lymphocytes Relative 4 %   Monocytes Relative 5 %   Eosinophils Relative 0 %   Basophils Relative 0 %   Neutro Abs 33.6 (H) 1.7 - 7.7 K/uL   Lymphs Abs 1.5 0.7 - 4.0 K/uL   Monocytes Absolute 1.8 (H) 0.1 - 1.0 K/uL   Eosinophils Absolute 0.0 0.0 - 0.7 K/uL   Basophils Absolute 0.0 0.0 - 0.1 K/uL   WBC Morphology WHITE COUNT CONFIRMED ON SMEAR     Comment: FEW NEUTROPHIL BANDS NOTED  Lipase, blood     Status: None   Collection Time: 11/13/15 11:00 PM  Result Value Ref Range   Lipase 44 11 - 51 U/L  I-stat troponin, ED     Status: None   Collection Time: 11/13/15 11:08 PM  Result Value Ref Range   Troponin i, poc 0.00 0.00 - 0.08 ng/mL   Comment 3             Comment: Due  to the release kinetics of cTnI, a negative result within the first hours of the onset of symptoms does not rule out myocardial infarction with certainty. If myocardial infarction is still suspected, repeat the test at appropriate intervals.   I-Stat CG4 Lactic Acid, ED     Status: None   Collection Time: 11/14/15 12:31 AM  Result Value Ref Range   Lactic Acid, Venous 1.74 0.5 - 1.9 mmol/L   Ct Abdomen Pelvis Wo Contrast  Result Date: 11/14/2015 CLINICAL DATA:  65 y/o M; postoperative infection. 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 History of partial sigmoid colectomy and colostomy. EXAM: CT CHEST, ABDOMEN AND PELVIS WITHOUT CONTRAST TECHNIQUE: Multidetector CT imaging of the chest, abdomen and pelvis was performed following the standard protocol without IV contrast. COMPARISON:  CT of the abdomen and pelvis dated 11/04/2015. FINDINGS: CT CHEST FINDINGS Cardiovascular: Normal heart size. The heart is anteriorly displaced and left for rotated due to mass effect from the large hiatal hernia. Small pericardial effusion. Mediastinum/Nodes: No mediastinal lymphadenopathy or mediastinal mass. There is a large hiatal hernia containing the entirety of the partially resected stomach as well as multiple loops of small bowel from both proximal and distal to the gastrojejunal anastomosis. Lungs/Pleura: Dependent opacities in the lower lobes bilaterally is likely due to compressive atelectasis from the large hiatal hernia. There is no significant interval change. Small stable bilateral pleural effusions. Musculoskeletal: No chest wall mass or suspicious bone lesions identified. CT ABDOMEN PELVIS FINDINGS Hepatobiliary: No mass visualized on this un-enhanced exam. Pancreas: No mass or inflammatory process identified on this un-enhanced exam. Spleen: Within normal limits in size. Adrenals/Urinary Tract: No definite mass is not visualized on  this unenhanced examination. There is a stable 8 mm stone at the right ureterovesicular junction without obstruction of the ureter probably within a urethral diverticulum. There is a new 3 mm nonobstructing stone within the left distal ureter in comparison with the prior CT of abdomen and pelvis (series 7, image 265). There are punctate nonobstructing stones within the kidneys bilaterally. Stomach/Bowel: Postsurgical changes related to partial gastrectomy and gastrojejunostomy. The entirety of the partially resected stomach resides in the large hiatal hernia within the posterior mediastinum. There has been herniation of the jejunostomy limb of the anastomosis into the hernia of which the degree of herniation appears to be similar in comparison with the prior CT of abdomen and pelvis. There is new marked did thickening the of the walls of the gastrojejunostomy loop with a thread-like passage of contrast into the more distal bowel (series 7, image 132). There has been interval marked dilatation of the hepatic limb of the native duodenum (series 7, image 193) possibly due to obstruction from increasing inflammatory changes in the gastrojejunal limb. Small ventral hernia containing small bowel without obstruction. Left abdominal colostomy with a periosteal hernia containing loops of small bowel with free passage of contrast and without evidence for obstruction. Vascular/Lymphatic: No pathologically enlarged lymph nodes. No evidence of abdominal aortic aneurysm. Reproductive: No mass or other significant abnormality. Other: Moderate volume of peritoneal ascites similar in comparison with the prior CT. Small bilateral inguinal hernias containing fat. Musculoskeletal: No acute osseous abnormality or suspicious osseous lesion. Mild degenerative changes of the spine. Lower chest: Bilateral dependent lower lobe opacities probably represent atelectasis due to the presence of large hiatal hernia. Small bilateral pleural  effusions. Lungs are otherwise clear. IMPRESSION: 1. Large hiatal hernia containing the entirety of the partially resected stomach and the  gastrojejunostomy limb of small bowel. The degree of herniation appears similar in comparison with the prior CT of the abdomen and pelvis. 2. Interval marked nonspecific thickening of the walls of the gastrojejunostomy limb with thread-like passage of contrast in the downstream bowel which may be due to infection, inflammation, or ischemia. 3. Interval dilatation of the native duodenum to the level of anastomosis probably due to the increased inflammatory changes of the jejunum and associated mass effect. 4. New nonobstructing 3 mm stone within the left distal ureter. Stable nonobstructing stone near the right ureterovesicular junction probably within a ureteral diverticulum. Nonobstructing stones in the kidneys bilaterally. 5. Stable moderate peritoneal ascites. 6. Stable bilateral lower lobe partial atelectasis and small pleural effusions. 7. Stable left lower quadrant pericolostomy herniation of small bowel without appreciable obstruction and free passage of contrast. Electronically Signed   By: Kristine Garbe M.D.   On: 11/14/2015 03:07   Dg Chest 2 View  Result Date: 11/13/2015 CLINICAL DATA:  Shortness of breath and fever today, colostomy, constipation, ulcer surgery last week EXAM: CHEST  2 VIEW COMPARISON:  11/05/2015 FINDINGS: Normal heart size and pulmonary vascularity. Atherosclerotic calcification aorta. Large hiatal hernia with suspected bowel loop within hernia sac. Bibasilar atelectasis and small pleural effusions. Upper lungs clear with underlying emphysematous no pneumothorax. Bones demineralized. IMPRESSION: Hiatal hernia with suspected paraesophageal bowel herniation. Bibasilar at pleural effusions and atelectasis. Aortic atherosclerosis. Electronically Signed   By: Lavonia Dana M.D.   On: 11/13/2015 22:06   Ct Chest Wo Contrast  Result Date:  11/14/2015 CLINICAL DATA:  65 y/o M; postoperative infection. 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 History of partial sigmoid colectomy and colostomy. EXAM: CT CHEST, ABDOMEN AND PELVIS WITHOUT CONTRAST TECHNIQUE: Multidetector CT imaging of the chest, abdomen and pelvis was performed following the standard protocol without IV contrast. COMPARISON:  CT of the abdomen and pelvis dated 11/04/2015. FINDINGS: CT CHEST FINDINGS Cardiovascular: Normal heart size. The heart is anteriorly displaced and left for rotated due to mass effect from the large hiatal hernia. Small pericardial effusion. Mediastinum/Nodes: No mediastinal lymphadenopathy or mediastinal mass. There is a large hiatal hernia containing the entirety of the partially resected stomach as well as multiple loops of small bowel from both proximal and distal to the gastrojejunal anastomosis. Lungs/Pleura: Dependent opacities in the lower lobes bilaterally is likely due to compressive atelectasis from the large hiatal hernia. There is no significant interval change. Small stable bilateral pleural effusions. Musculoskeletal: No chest wall mass or suspicious bone lesions identified. CT ABDOMEN PELVIS FINDINGS Hepatobiliary: No mass visualized on this un-enhanced exam. Pancreas: No mass or inflammatory process identified on this un-enhanced exam. Spleen: Within normal limits in size. Adrenals/Urinary Tract: No definite mass is not visualized on this unenhanced examination. There is a stable 8 mm stone at the right ureterovesicular junction without obstruction of the ureter probably within a urethral diverticulum. There is a new 3 mm nonobstructing stone within the left distal ureter in comparison with the prior CT of abdomen and pelvis (series 7, image 265). There are punctate nonobstructing stones within the kidneys bilaterally. Stomach/Bowel: Postsurgical changes related to partial gastrectomy and  gastrojejunostomy. The entirety of the partially resected stomach resides in the large hiatal hernia within the posterior mediastinum. There has been herniation of the jejunostomy limb of the anastomosis into the hernia of which the degree of herniation appears to be similar in comparison with the prior CT of abdomen and pelvis. There is  new marked did thickening the of the walls of the gastrojejunostomy loop with a thread-like passage of contrast into the more distal bowel (series 7, image 132). There has been interval marked dilatation of the hepatic limb of the native duodenum (series 7, image 193) possibly due to obstruction from increasing inflammatory changes in the gastrojejunal limb. Small ventral hernia containing small bowel without obstruction. Left abdominal colostomy with a periosteal hernia containing loops of small bowel with free passage of contrast and without evidence for obstruction. Vascular/Lymphatic: No pathologically enlarged lymph nodes. No evidence of abdominal aortic aneurysm. Reproductive: No mass or other significant abnormality. Other: Moderate volume of peritoneal ascites similar in comparison with the prior CT. Small bilateral inguinal hernias containing fat. Musculoskeletal: No acute osseous abnormality or suspicious osseous lesion. Mild degenerative changes of the spine. Lower chest: Bilateral dependent lower lobe opacities probably represent atelectasis due to the presence of large hiatal hernia. Small bilateral pleural effusions. Lungs are otherwise clear. IMPRESSION: 1. Large hiatal hernia containing the entirety of the partially resected stomach and the gastrojejunostomy limb of small bowel. The degree of herniation appears similar in comparison with the prior CT of the abdomen and pelvis. 2. Interval marked nonspecific thickening of the walls of the gastrojejunostomy limb with thread-like passage of contrast in the downstream bowel which may be due to infection, inflammation, or  ischemia. 3. Interval dilatation of the native duodenum to the level of anastomosis probably due to the increased inflammatory changes of the jejunum and associated mass effect. 4. New nonobstructing 3 mm stone within the left distal ureter. Stable nonobstructing stone near the right ureterovesicular junction probably within a ureteral diverticulum. Nonobstructing stones in the kidneys bilaterally. 5. Stable moderate peritoneal ascites. 6. Stable bilateral lower lobe partial atelectasis and small pleural effusions. 7. Stable left lower quadrant pericolostomy herniation of small bowel without appreciable obstruction and free passage of contrast. Electronically Signed   By: Kristine Garbe M.D.   On: 11/14/2015 03:07    Review of Systems  All other systems reviewed and are negative.   Blood pressure 118/73, pulse 113, temperature 98.3 F (36.8 C), temperature source Oral, resp. rate 24, height _0  (1.676 m), weight 64.4 kg (142 lb), SpO2 94 %. Physical Exam  Constitutional: He is oriented to person, place, and time.  Small man who appears weak  HENT:  Head: Normocephalic and atraumatic.  Right Ear: External ear normal.  Left Ear: External ear normal.  Eyes: Conjunctivae are normal. Pupils are equal, round, and reactive to light. Right eye exhibits no discharge. Left eye exhibits no discharge. No scleral icterus.  Neck: Normal range of motion. No tracheal deviation present.  Cardiovascular: Regular rhythm and normal heart sounds.   Tachycardic  Respiratory: Effort normal and breath sounds normal. No respiratory distress.  GI: Soft. He exhibits no distension.  There is mild tenderness diffusely. His ostomy is pink. His midline wound is fairly clean with excellent granulation tissue except at the lower aspect which does have some drainage. There is no cellulitis.  Musculoskeletal: Normal range of motion. He exhibits no edema.  Neurological: He is alert and oriented to person, place,  and time.  Skin: Skin is warm and dry. No erythema.  Psychiatric: His behavior is normal. Judgment normal.     Assessment/Plan Sepsis  As compared to his previous CT scan on August 8, there is thickening of the gastrojejunostomy limb of his anastomosis as well as the duodenum. There is no leak of contrast. There is no  increase in the size of the hiatal hernia. There is moderate ascites which is unchanged. He does have an incidental distal left ureteral stone that is not obstructing. He'll be admitted to the hospital for aggressive IV rehydration and IV antibiotics. I will discuss him with our doc of the week this morning. He will need urology to see him regarding the kidney stone. We may have to have gastroenterology perform an upper endoscopy to evaluate the gastrojejunostomy. He currently does not have frank peritonitis necessitating an emergent laparotomy.  Harl Bowie, MD 11/14/2015, 5:56 AM

## 2015-11-14 NOTE — ED Notes (Signed)
Attempted report 

## 2015-11-14 NOTE — Anesthesia Postprocedure Evaluation (Signed)
Anesthesia Post Note  Patient: Miguel [name]  Procedure(s) Performed: Procedure(s) (LRB): ESOPHAGOGASTRODUODENOSCOPY (EGD) (N/A)  Patient location during evaluation: Endoscopy Anesthesia Type: MAC Level of consciousness: awake and alert Pain management: pain level controlled Vital Signs Assessment: post-procedure vital signs reviewed and stable Respiratory status: spontaneous breathing, nonlabored ventilation, respiratory function stable and patient connected to nasal cannula oxygen Cardiovascular status: stable and blood pressure returned to baseline Anesthetic complications: no    Last Vitals:  Vitals:   11/09/15 0445 11/09/15 1402  BP: 128/73 123/76  Pulse: 75 90  Resp: 17   Temp: 37.1 C 37.3 C    Last Pain:  Vitals:   11/09/15 1402  TempSrc: Oral  PainSc:                  Shelby Peltz,JAMES TERRILL

## 2015-11-14 NOTE — Consult Note (Signed)
Medical Consultation   Miguel [name]  [id number]  DOB: [birth year]  DOA: 11/13/2015  PCP: Donnie Coffin, MD Consultants:  Rush Farmer - surgery Patient coming from: home - lives with wife  Chief Complaint: fever  Requesting physician: ER - Dr. Christy Gentles  Reason for consultation: admission   HPI: Miguel [name] is a 65 y.o. male with medical history significant of complicated surgical issues recently.  Patient had emergency colon surgery in April for perforated sigmoid diverticulitis; he had a hartmann's procedure performed at that time by Dr. Ninfa Linden.   Due to have reversal last month but developed a bleeding ulcer.  Hospitalized a couple of weeks (7/31-8/13), had partial distal gastrectomy with biliroth II reconstruction/gastrojejunostomy, discharged Sunday. He says he had a fairly good couple of days.  Started feeling bad on Tuesday - increased fatigue.  Fever to 102.  Decreased PO intake.  Persistent hiccups, gets kind of strangled.  Wife called the surgeon and he suggested the patient come in.      Review of Systems:  ROS As per HPI otherwise 10 point review of systems reviewed and negative.    Past Medical History: Past Medical History:  Diagnosis Date  . Anemia   . Anxiety   . Gastritis   . GI bleed due to NSAIDs 10/27/2015  . Hyperlipidemia   . Hypertension   . Kidney stones   . Melanoma of back ([place])    "mid back"  . Sigmoid diverticulitis    with perforation    Past Surgical History: Past Surgical History:  Procedure Laterality Date  . COLON SURGERY    . ESOPHAGOGASTRODUODENOSCOPY N/A 10/27/2015   Procedure: ESOPHAGOGASTRODUODENOSCOPY (EGD);  Surgeon: Clarene Essex, MD;  Location: [facility] ENDOSCOPY;  Service: Endoscopy;  Laterality: N/A;  . ESOPHAGOGASTRODUODENOSCOPY N/A 10/29/2015   Procedure: ESOPHAGOGASTRODUODENOSCOPY (EGD);  Surgeon: Ronald Lobo, MD;  Location: [facility] ENDOSCOPY;  Service: Endoscopy;  Laterality: N/A;  .  ESOPHAGOGASTRODUODENOSCOPY N/A 10/30/2015   Procedure: ESOPHAGOGASTRODUODENOSCOPY (EGD);  Surgeon: Clarene Essex, MD;  Location: [facility] ENDOSCOPY;  Service: Endoscopy;  Laterality: N/A;  . HEMORRHOID BANDING  X 2  . KNEE CARTILAGE SURGERY Right 1971   "opened me up"  . LAPAROTOMY N/A 07/05/2015   Procedure: PARTIAL SIGMOID COLECTOMY AND COLOSTOMY;  Surgeon: [name], MD;  Location: [place];  Service: General;  Laterality: N/A;  . MELANOMA EXCISION  2001  . 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 REPAIR OF PERFORATED ULCER N/A 10/30/2015   Procedure: REPAIR OF BLEEDING  ULCER;  Surgeon: Judeth Horn, MD;  Location: [place];  Service: General;  Laterality: N/A;  . TUMOR EXCISION  2009   "back; fatty tumor"     Allergies:   Allergies  Allergen Reactions  . No Known Allergies      Social History:  reports that he has never smoked. He quit smokeless tobacco use about 4 months ago. His smokeless tobacco use included Snuff. He reports that he does not drink alcohol or use drugs.   Family History: Family History  Problem Relation Age of Onset  . Stroke Mother   . Stroke Brother   . Heart disease Brother     Physical Exam: Vitals:   11/14/15 0345 11/14/15 0415 11/14/15 0445 11/14/15 0515  BP: 105/63 99/59 119/79 118/73  Pulse: 110  109 110 113  Resp: 22 20 (!) 28 24  Temp:      TempSrc:      SpO2: 97% 98% 96% 94%  Weight:      Height:        Constitutional:  Alert and awake, oriented x3, not in any acute distress.  Lying curled up on his left side.  Hiccups occurring throughout evaluation. Eyes: PERLA, EOMI, irises appear normal, anicteric sclera,  ENMT: external ears and nose appear normal,             Lips appears normal, oropharynx mucosa, tongue, posterior pharynx appear normal  Neck: neck appears normal, no masses, normal ROM, no thyromegaly, no  JVD  CVS: S1-S2 clear, no murmur rubs or gallops, no LE edema, normal pedal pulses  Respiratory:  clear to auscultation bilaterally, no wheezing, rales or rhonchi. Respiratory effort normal. No accessory muscle use.  Abdomen: hypoactive bowel sounds, ostomy in place, persistent open surgical incision along midline upper abdomen that has foul-selling purulent drainage Musculoskeletal: : no cyanosis, clubbing or edema noted bilaterally Neuro: Cranial nerves II-XII intact, strength, sensation, reflexes Psych: judgement and insight appear normal, stable mood and affect, mental status Skin: no rashes or lesions or ulcers, no induration or nodules    Data reviewed:  I have personally reviewed following labs and imaging studies Labs:  CBC:  Recent Labs Lab 11/07/15 0939 11/08/15 0507 11/08/15 1238 11/09/15 0308 11/13/15 2203  WBC 13.3* 9.5 9.8 10.7* 36.9*  NEUTROABS 10.9* 6.9  --  7.8* 33.6*  HGB 8.6* 7.3* 8.0* 7.9* 11.5*  HCT 27.4* 23.6* 25.0* 25.5* 36.1*  MCV 91.9 91.8 90.3 91.4 90.7  PLT 447* 461* 482* 530* 896*    Basic Metabolic Panel:  Recent Labs Lab 11/07/15 0939 11/08/15 0507 11/09/15 0308 11/13/15 2203  NA 132* 136 139 132*  K 4.2 4.6 4.1 4.4  CL 98* 100* 102 100*  CO2 22 28 27  19*  GLUCOSE 141* 114* 107* 185*  BUN 10 8 7 17   CREATININE 0.98 0.99 1.05 1.92*  CALCIUM 8.0* 7.7* 7.7* 8.4*   GFR Estimated Creatinine Clearance: 34.6 mL/min (by C-G formula based on SCr of 1.92 mg/dL). Liver Function Tests:  Recent Labs Lab 11/09/15 0308 11/13/15 2203  AST 23 15  ALT 32 15*  ALKPHOS 85 92  BILITOT 0.3 0.5  PROT 4.9* 6.9  ALBUMIN 1.8* 2.5*    Recent Labs Lab 11/13/15 2300  LIPASE 44   No results for input(s): AMMONIA in the last 168 hours. Coagulation profile No results for input(s): INR, PROTIME in the last 168 hours.  Cardiac Enzymes: No results for input(s): CKTOTAL, CKMB, CKMBINDEX, TROPONINI in the last 168 hours. BNP: Invalid input(s):  POCBNP CBG: No results for input(s): GLUCAP in the last 168 hours. D-Dimer No results for input(s): DDIMER in the last 72 hours. Hgb A1c No results for input(s): HGBA1C in the last 72 hours. Lipid Profile No results for input(s): CHOL, HDL, LDLCALC, TRIG, CHOLHDL, LDLDIRECT in the last 72 hours. Thyroid function studies No results for input(s): TSH, T4TOTAL, T3FREE, THYROIDAB in the last 72 hours.  Invalid input(s): FREET3 Anemia work up No results for input(s): VITAMINB12, FOLATE, FERRITIN, TIBC, IRON, RETICCTPCT in the last 72 hours. Urinalysis    Component Value Date/Time   COLORURINE AMBER (A) 11/13/2015 2137   APPEARANCEUR CLOUDY (A) 11/13/2015 2137   LABSPEC 1.026 11/13/2015 2137   PHURINE 5.5 11/13/2015 2137   GLUCOSEU NEGATIVE 11/13/2015 2137   HGBUR NEGATIVE 11/13/2015 2137  BILIRUBINUR SMALL (A) 11/13/2015 2137   KETONESUR NEGATIVE 11/13/2015 2137   PROTEINUR 100 (A) 11/13/2015 2137   UROBILINOGEN 0.2 11/03/2013 0629   NITRITE NEGATIVE 11/13/2015 2137   LEUKOCYTESUR NEGATIVE 11/13/2015 2137     Microbiology Recent Results (from the past 240 hour(s))  Culture, blood (Routine X 2) w Reflex to ID Panel     Status: None   Collection Time: 11/04/15  5:05 PM  Result Value Ref Range Status   Specimen Description BLOOD LEFT HAND  Final   Special Requests BOTTLES DRAWN AEROBIC AND ANAEROBIC 5CC  Final   Culture NO GROWTH 5 DAYS  Final   Report Status 11/09/2015 FINAL  Final  Culture, blood (Routine X 2) w Reflex to ID Panel     Status: None   Collection Time: 11/04/15  6:06 PM  Result Value Ref Range Status   Specimen Description BLOOD LEFT ANTECUBITAL  Final   Special Requests BOTTLES DRAWN AEROBIC ONLY 10CC  Final   Culture NO GROWTH 5 DAYS  Final   Report Status 11/09/2015 FINAL  Final  Aerobic Culture (superficial specimen)     Status: None   Collection Time: 11/07/15 12:21 PM  Result Value Ref Range Status   Specimen Description ABDOMEN  Final   Special  Requests NONE  Final   Gram Stain   Final    FEW WBC PRESENT, PREDOMINANTLY PMN RARE GRAM POSITIVE COCCI IN PAIRS    Culture   Final    FEW STREPTOCOCCUS GROUP F FEW ENTEROCOCCUS SPECIES    Report Status 11/10/2015 FINAL  Final   Organism ID, Bacteria ENTEROCOCCUS SPECIES  Final      Susceptibility   Enterococcus species - MIC*    AMPICILLIN <=2 SENSITIVE Sensitive     VANCOMYCIN <=0.5 SENSITIVE Sensitive     GENTAMICIN SYNERGY SENSITIVE Sensitive     * FEW ENTEROCOCCUS SPECIES       Inpatient Medications:   Scheduled Meds: . diatrizoate meglumine-sodium       Continuous Infusions: . sodium chloride Stopped (11/14/15 0217)  . piperacillin-tazobactam (ZOSYN)  IV       Radiological Exams on Admission: Ct Abdomen Pelvis Wo Contrast  Result Date: 11/14/2015 CLINICAL DATA:  65 y/o M; postoperative infection. 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 History of partial sigmoid colectomy and colostomy. EXAM: CT CHEST, ABDOMEN AND PELVIS WITHOUT CONTRAST TECHNIQUE: Multidetector CT imaging of the chest, abdomen and pelvis was performed following the standard protocol without IV contrast. COMPARISON:  CT of the abdomen and pelvis dated 11/04/2015. FINDINGS: CT CHEST FINDINGS Cardiovascular: Normal heart size. The heart is anteriorly displaced and left for rotated due to mass effect from the large hiatal hernia. Small pericardial effusion. Mediastinum/Nodes: No mediastinal lymphadenopathy or mediastinal mass. There is a large hiatal hernia containing the entirety of the partially resected stomach as well as multiple loops of small bowel from both proximal and distal to the gastrojejunal anastomosis. Lungs/Pleura: Dependent opacities in the lower lobes bilaterally is likely due to compressive atelectasis from the large hiatal hernia. There is no significant interval change. Small stable bilateral pleural effusions. Musculoskeletal: No chest  wall mass or suspicious bone lesions identified. CT ABDOMEN PELVIS FINDINGS Hepatobiliary: No mass visualized on this un-enhanced exam. Pancreas: No mass or inflammatory process identified on this un-enhanced exam. Spleen: Within normal limits in size. Adrenals/Urinary Tract: No definite mass is not visualized on this unenhanced examination. There is a stable 8 mm stone at the  right ureterovesicular junction without obstruction of the ureter probably within a urethral diverticulum. There is a new 3 mm nonobstructing stone within the left distal ureter in comparison with the prior CT of abdomen and pelvis (series 7, image 265). There are punctate nonobstructing stones within the kidneys bilaterally. Stomach/Bowel: Postsurgical changes related to partial gastrectomy and gastrojejunostomy. The entirety of the partially resected stomach resides in the large hiatal hernia within the posterior mediastinum. There has been herniation of the jejunostomy limb of the anastomosis into the hernia of which the degree of herniation appears to be similar in comparison with the prior CT of abdomen and pelvis. There is new marked did thickening the of the walls of the gastrojejunostomy loop with a thread-like passage of contrast into the more distal bowel (series 7, image 132). There has been interval marked dilatation of the hepatic limb of the native duodenum (series 7, image 193) possibly due to obstruction from increasing inflammatory changes in the gastrojejunal limb. Small ventral hernia containing small bowel without obstruction. Left abdominal colostomy with a periosteal hernia containing loops of small bowel with free passage of contrast and without evidence for obstruction. Vascular/Lymphatic: No pathologically enlarged lymph nodes. No evidence of abdominal aortic aneurysm. Reproductive: No mass or other significant abnormality. Other: Moderate volume of peritoneal ascites similar in comparison with the prior CT. Small  bilateral inguinal hernias containing fat. Musculoskeletal: No acute osseous abnormality or suspicious osseous lesion. Mild degenerative changes of the spine. Lower chest: Bilateral dependent lower lobe opacities probably represent atelectasis due to the presence of large hiatal hernia. Small bilateral pleural effusions. Lungs are otherwise clear. IMPRESSION: 1. Large hiatal hernia containing the entirety of the partially resected stomach and the gastrojejunostomy limb of small bowel. The degree of herniation appears similar in comparison with the prior CT of the abdomen and pelvis. 2. Interval marked nonspecific thickening of the walls of the gastrojejunostomy limb with thread-like passage of contrast in the downstream bowel which may be due to infection, inflammation, or ischemia. 3. Interval dilatation of the native duodenum to the level of anastomosis probably due to the increased inflammatory changes of the jejunum and associated mass effect. 4. New nonobstructing 3 mm stone within the left distal ureter. Stable nonobstructing stone near the right ureterovesicular junction probably within a ureteral diverticulum. Nonobstructing stones in the kidneys bilaterally. 5. Stable moderate peritoneal ascites. 6. Stable bilateral lower lobe partial atelectasis and small pleural effusions. 7. Stable left lower quadrant pericolostomy herniation of small bowel without appreciable obstruction and free passage of contrast. Electronically Signed   By: Kristine Garbe M.D.   On: 11/14/2015 03:07   Dg Chest 2 View  Result Date: 11/13/2015 CLINICAL DATA:  Shortness of breath and fever today, colostomy, constipation, ulcer surgery last week EXAM: CHEST  2 VIEW COMPARISON:  11/05/2015 FINDINGS: Normal heart size and pulmonary vascularity. Atherosclerotic calcification aorta. Large hiatal hernia with suspected bowel loop within hernia sac. Bibasilar atelectasis and small pleural effusions. Upper lungs clear with  underlying emphysematous no pneumothorax. Bones demineralized. IMPRESSION: Hiatal hernia with suspected paraesophageal bowel herniation. Bibasilar at pleural effusions and atelectasis. Aortic atherosclerosis. Electronically Signed   By: Lavonia Dana M.D.   On: 11/13/2015 22:06   Ct Chest Wo Contrast  Result Date: 11/14/2015 CLINICAL DATA:  65 y/o M; postoperative infection. 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 History of partial sigmoid colectomy and colostomy. EXAM: CT CHEST, ABDOMEN AND PELVIS WITHOUT CONTRAST TECHNIQUE: Multidetector CT imaging of  the chest, abdomen and pelvis was performed following the standard protocol without IV contrast. COMPARISON:  CT of the abdomen and pelvis dated 11/04/2015. FINDINGS: CT CHEST FINDINGS Cardiovascular: Normal heart size. The heart is anteriorly displaced and left for rotated due to mass effect from the large hiatal hernia. Small pericardial effusion. Mediastinum/Nodes: No mediastinal lymphadenopathy or mediastinal mass. There is a large hiatal hernia containing the entirety of the partially resected stomach as well as multiple loops of small bowel from both proximal and distal to the gastrojejunal anastomosis. Lungs/Pleura: Dependent opacities in the lower lobes bilaterally is likely due to compressive atelectasis from the large hiatal hernia. There is no significant interval change. Small stable bilateral pleural effusions. Musculoskeletal: No chest wall mass or suspicious bone lesions identified. CT ABDOMEN PELVIS FINDINGS Hepatobiliary: No mass visualized on this un-enhanced exam. Pancreas: No mass or inflammatory process identified on this un-enhanced exam. Spleen: Within normal limits in size. Adrenals/Urinary Tract: No definite mass is not visualized on this unenhanced examination. There is a stable 8 mm stone at the right ureterovesicular junction without obstruction of the ureter probably within a urethral  diverticulum. There is a new 3 mm nonobstructing stone within the left distal ureter in comparison with the prior CT of abdomen and pelvis (series 7, image 265). There are punctate nonobstructing stones within the kidneys bilaterally. Stomach/Bowel: Postsurgical changes related to partial gastrectomy and gastrojejunostomy. The entirety of the partially resected stomach resides in the large hiatal hernia within the posterior mediastinum. There has been herniation of the jejunostomy limb of the anastomosis into the hernia of which the degree of herniation appears to be similar in comparison with the prior CT of abdomen and pelvis. There is new marked did thickening the of the walls of the gastrojejunostomy loop with a thread-like passage of contrast into the more distal bowel (series 7, image 132). There has been interval marked dilatation of the hepatic limb of the native duodenum (series 7, image 193) possibly due to obstruction from increasing inflammatory changes in the gastrojejunal limb. Small ventral hernia containing small bowel without obstruction. Left abdominal colostomy with a periosteal hernia containing loops of small bowel with free passage of contrast and without evidence for obstruction. Vascular/Lymphatic: No pathologically enlarged lymph nodes. No evidence of abdominal aortic aneurysm. Reproductive: No mass or other significant abnormality. Other: Moderate volume of peritoneal ascites similar in comparison with the prior CT. Small bilateral inguinal hernias containing fat. Musculoskeletal: No acute osseous abnormality or suspicious osseous lesion. Mild degenerative changes of the spine. Lower chest: Bilateral dependent lower lobe opacities probably represent atelectasis due to the presence of large hiatal hernia. Small bilateral pleural effusions. Lungs are otherwise clear. IMPRESSION: 1. Large hiatal hernia containing the entirety of the partially resected stomach and the gastrojejunostomy limb of  small bowel. The degree of herniation appears similar in comparison with the prior CT of the abdomen and pelvis. 2. Interval marked nonspecific thickening of the walls of the gastrojejunostomy limb with thread-like passage of contrast in the downstream bowel which may be due to infection, inflammation, or ischemia. 3. Interval dilatation of the native duodenum to the level of anastomosis probably due to the increased inflammatory changes of the jejunum and associated mass effect. 4. New nonobstructing 3 mm stone within the left distal ureter. Stable nonobstructing stone near the right ureterovesicular junction probably within a ureteral diverticulum. Nonobstructing stones in the kidneys bilaterally. 5. Stable moderate peritoneal ascites. 6. Stable bilateral lower lobe partial atelectasis and small pleural effusions. 7.  Stable left lower quadrant pericolostomy herniation of small bowel without appreciable obstruction and free passage of contrast. Electronically Signed   By: Kristine Garbe M.D.   On: 11/14/2015 03:07    Impression/Recommendations Active Problems:   Hyperglycemia   Sepsis ([place])   Hiccups   AKI (acute kidney injury) ([place])  Sepsis -Patient with fever, tachycardia, and markedly elevated WBC count compared to discharge value. -Most likely etiology of sepsis is his recent abdominal infection - either resulting from his surgical wound which is healing by secondary intention and which is currently emitting a purulent malodorous discharge; or more likely from an intraabdominal post-surgical infection.   -Patient started on Zosyn in ER.   -His abdominal wound grew Enterococcus on 8/11 which was sensitive to Vanc; would suggest addition of Vancomycin. -He did have a few small and nonobstructing kidney stones but UA tonight is negative for LE and nitrites and with few WBC and rare bacteria; this is extremely unlikely to be the source of his severe sepsis. -Would recommend admission to  the surgical service for possible need for surgical washout for source control. -Patient was given early goal directed therapy in the ER with improvement in his overall sepsis physiology. -Blood and urine cultures are pending.  AKI -Likely resulting from sepsis.  Should improve with IVF.  Hyperglycemia -Does not have known h/o DM -A1c was 5.9 on 7/31 -Would suggest SSI for improved wound healing.  Hiccups -Would recommend thorazine as this symptom is very bothersome to the patient.   Thank you for this consultation.  Our [facility] hospitalist team will be happy to reconsult should the need arise.   Time Spent: 91 minutes  Karmen Bongo M.D. [facility] 11/14/2015, 5:40 AM

## 2015-11-14 NOTE — ED Notes (Signed)
Patient transported to CT 

## 2015-11-15 LAB — URINE CULTURE: Culture: NO GROWTH

## 2015-11-15 LAB — COMPREHENSIVE METABOLIC PANEL
ALK PHOS: 106 U/L (ref 38–126)
ALT: 12 U/L — AB (ref 17–63)
AST: 18 U/L (ref 15–41)
Albumin: 1.9 g/dL — ABNORMAL LOW (ref 3.5–5.0)
Anion gap: 8 (ref 5–15)
BUN: 19 mg/dL (ref 6–20)
CALCIUM: 7.6 mg/dL — AB (ref 8.9–10.3)
CO2: 21 mmol/L — ABNORMAL LOW (ref 22–32)
CREATININE: 1.18 mg/dL (ref 0.61–1.24)
Chloride: 104 mmol/L (ref 101–111)
Glucose, Bld: 114 mg/dL — ABNORMAL HIGH (ref 65–99)
Potassium: 4.8 mmol/L (ref 3.5–5.1)
Sodium: 133 mmol/L — ABNORMAL LOW (ref 135–145)
Total Bilirubin: 0.8 mg/dL (ref 0.3–1.2)
Total Protein: 5.6 g/dL — ABNORMAL LOW (ref 6.5–8.1)

## 2015-11-15 LAB — DIFFERENTIAL
BASOS ABS: 0 10*3/uL (ref 0.0–0.1)
BASOS PCT: 0 %
EOS ABS: 0 10*3/uL (ref 0.0–0.7)
Eosinophils Relative: 0 %
LYMPHS ABS: 0.7 10*3/uL (ref 0.7–4.0)
Lymphocytes Relative: 2 %
MONO ABS: 1.2 10*3/uL — AB (ref 0.1–1.0)
MONOS PCT: 4 %
Neutro Abs: 27.6 10*3/uL — ABNORMAL HIGH (ref 1.7–7.7)
Neutrophils Relative %: 94 %

## 2015-11-15 LAB — CBC
HCT: 28.6 % — ABNORMAL LOW (ref 39.0–52.0)
Hemoglobin: 8.8 g/dL — ABNORMAL LOW (ref 13.0–17.0)
MCH: 28.3 pg (ref 26.0–34.0)
MCHC: 30.8 g/dL (ref 30.0–36.0)
MCV: 92 fL (ref 78.0–100.0)
Platelets: 655 10*3/uL — ABNORMAL HIGH (ref 150–400)
RBC: 3.11 MIL/uL — ABNORMAL LOW (ref 4.22–5.81)
RDW: 16.3 % — ABNORMAL HIGH (ref 11.5–15.5)
WBC: 30.1 10*3/uL — ABNORMAL HIGH (ref 4.0–10.5)

## 2015-11-15 LAB — PREALBUMIN: Prealbumin: 7.1 mg/dL — ABNORMAL LOW (ref 18–38)

## 2015-11-15 LAB — PHOSPHORUS: PHOSPHORUS: 2.5 mg/dL (ref 2.5–4.6)

## 2015-11-15 LAB — TRIGLYCERIDES: Triglycerides: 96 mg/dL (ref <150)

## 2015-11-15 LAB — MAGNESIUM: Magnesium: 2.2 mg/dL (ref 1.7–2.4)

## 2015-11-15 MED ORDER — SODIUM CHLORIDE 0.9% FLUSH
10.0000 mL | INTRAVENOUS | Status: DC | PRN
  Administered 2015-11-21 – 2015-12-04 (×5): 10 mL
  Filled 2015-11-15 (×5): qty 40

## 2015-11-15 MED ORDER — TAMSULOSIN HCL 0.4 MG PO CAPS
0.4000 mg | ORAL_CAPSULE | Freq: Every day | ORAL | Status: DC
  Administered 2015-11-15 – 2015-11-20 (×6): 0.4 mg via ORAL
  Filled 2015-11-15 (×5): qty 1

## 2015-11-15 MED ORDER — SODIUM CHLORIDE 0.9% FLUSH
10.0000 mL | Freq: Two times a day (BID) | INTRAVENOUS | Status: DC
  Administered 2015-11-15 – 2015-11-29 (×15): 10 mL
  Administered 2015-11-30 (×2): 20 mL
  Administered 2015-12-03 – 2015-12-05 (×4): 10 mL

## 2015-11-15 NOTE — Progress Notes (Signed)
Initial Nutrition Assessment  DOCUMENTATION CODES:   Severe malnutrition in context of acute illness/injury  INTERVENTION:  TPN per pharmacy.   Continue clear liquid diet as tolerated.   Discontinue Ensure.   NUTRITION DIAGNOSIS:   Malnutrition related to acute illness as evidenced by percent weight loss, moderate depletions of muscle mass, moderate depletion of body fat.  GOAL:   Patient will meet greater than or equal to 90% of their needs  MONITOR:   PO intake, Diet advancement, Labs, Weight trends, Skin, I & O's  REASON FOR ASSESSMENT:   Consult, Malnutrition Screening Tool New TPN/TNA  ASSESSMENT:   65 year old male. He is status post an antrectomy with B2 anastomosis on August 3 for bleeding ulcer. He has had a previous colostomy for perforated diverticulitis. He was discharged home approximate 4 days ago. He has had increasing fatigue, abdominal discomfort, nausea, and fever over the past 2 days. Some thickening of the gastrojejunostomy limb - no obstruction.  RD consulted for new TPN. Pt reports nausea and no appetite during time of visit. Pt is currently on a clear liquid diet and reports he has been only taking little sips of his coffee. Per MD note, pt with significant nausea and has been "spitting up". Pt reports little to no po intake since discharge 8/13. Pt reports only taking sips of liquids. Pt reports weight loss with usual body weight ~171 lbs he reports last weighing 2 weeks ago. Pt with a 20% weight loss in 2 weeks. Pt pharmacy note, plans for TPN to start tomorrow as PICC has not been placed. RD to continue to monitor.   Nutrition-Focused physical exam completed. Findings are moderate fat depletion, moderate muscle depletion, and no edema.   Labs and medications reviewed.   Diet Order:  Diet clear liquid Room service appropriate? Yes; Fluid consistency: Thin  Skin:  Reviewed, no issues  Last BM:  colostomy  Height:   Ht Readings from Last 1  Encounters:  11/14/15 5\' 6"  (1.676 m)    Weight:   Wt Readings from Last 1 Encounters:  11/14/15 136 lb 9.6 oz (62 kg)    Ideal Body Weight:  64.5 kg  BMI:  Body mass index is 22.05 kg/m.  Estimated Nutritional Needs:   Kcal:  1800-2000  Protein:  85-100 grams  Fluid:  1.8 - 2. L/day  EDUCATION NEEDS:   No education needs identified at this time  Corrin Parker, MS, RD, LDN Pager # [phone] After hours/ weekend pager # [phone]

## 2015-11-15 NOTE — Progress Notes (Signed)
PARENTERAL NUTRITION CONSULT NOTE - INITIAL  Pharmacy Consult for TPN Indication: Intolerance to enteral feeding  Allergies  Allergen Reactions  . No Known Allergies     Patient Measurements: Height: _0  (167.6 cm) Weight: 136 lb 9.6 oz (62 kg) IBW/kg (Calculated) : 63.8 Weight last admit: 77.8 kg  Usual weight: ~79.5 kg  Vital Signs: Temp: 100.2 F (37.9 C) (08/19 0755) Temp Source: Oral (08/19 0755) BP: 119/78 (08/19 0755) Pulse Rate: 114 (08/19 0755) Intake/Output from previous day: 08/18 0701 - 08/19 0700 In: 2104.2 [I.V.:1854.2; IV Piggyback:250] Out: 300 [Urine:300] Intake/Output from this shift: Total I/O In: -  Out: 100 [Urine:100]  Labs:  Recent Labs  11/13/15 2203 11/14/15 1853  WBC 36.9* 29.8*  HGB 11.5* 8.8*  HCT 36.1* 27.7*  PLT 896* 675*     Recent Labs  11/13/15 2203 11/14/15 1853  NA 132* 134*  K 4.4 4.5  CL 100* 104  CO2 19* 23  GLUCOSE 185* 134*  BUN 17 21*  CREATININE 1.92* 1.38*  CALCIUM 8.4* 7.6*  PROT 6.9  --   ALBUMIN 2.5*  --   AST 15  --   ALT 15*  --   ALKPHOS 92  --   BILITOT 0.5  --    Estimated Creatinine Clearance: 46.8 mL/min (by C-G formula based on SCr of 1.38 mg/dL).   No results for input(s): GLUCAP in the last 72 hours.  Medical History: Past Medical History:  Diagnosis Date  . Anemia   . Anxiety   . Gastritis   . GI bleed due to NSAIDs 10/27/2015  . Hyperlipidemia   . Hypertension   . Kidney stones   . 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 Sigmoid diverticulitis    with perforation    Medications:  Scheduled:  . enoxaparin (LOVENOX) injection  40 mg Subcutaneous Q24H  . famotidine (PEPCID) IV  20 mg Intravenous Q12H  . feeding supplement (ENSURE ENLIVE)  237 mL Oral BID BM  . piperacillin-tazobactam (ZOSYN)  IV  3.375 g Intravenous Q8H    Insulin Requirements in the past 24 hours:  none  Admit: 65 y/o male who presented to ED 11/13/2015 with fever and N/V admitted with sepsis. He  has a recent discharge after repair of a bleeding ulcer and was on TPN that admission. Repeat CT with thickening of the gastrojejunostomy limb of his anastomosis as well as the duodenum, no leak, no increase in size of hiatal hernia, non obstructing distal left ureteral stone. Urology to be consulted. MD reports minimal nutrition over last couple weeks.  Assessment: 15.8 kg weight loss since 8/12. He reports minimal oral intake since discharge on 8/13 and not much appetite.    Surgeries/Procedures: 8/3: Billroth II repair, partial gastrectomy for giant ulcer 06/2015: perforated sigmoid diverticulitis s/p colostomy   GI: (+)nausea, spitting up, ostomy output less per CCS. Albumin 1.9. Pepcid IV, Ensure bid Endo: no hx DM Lytes: Na 133, K 4.8, CoCa 9.2, Phos 2.5, Mg 2.2 Renal: AKI, SCr 1.38. NS + 20K @ 150 ml/hr Pulm: RA Cards: BP soft, tachy Hepatobil: LFTs wnl, tbili wnl, alk phos wnl, Palb 7.1, TG 96 Neuro: intact ID: sepsis, ?enteritis, no sign intra-abd process, wound clean per CCS, Tm 101.9, WBC up to 30.1 Best Practices: enox 40  TPN Access: PICC ordered to be placed TPN start date: pending  Current Nutrition:  Clear liquids  Nutritional Goals:  1800-2000 kCal,  95-115grams of protein per day  Plan:  PICC has not been placed yet - TPN to start tomorrow F/U RD recommendations   Renold Genta, PharmD, BCPS Clinical Pharmacist Phone for today - [place] - [phone] 11/15/2015 8:47 AM

## 2015-11-15 NOTE — Progress Notes (Signed)
Subjective: Patient still with significant nausea - "spitting up" No significant pain No new labs today Less ostomy output  Objective: Vital signs in last 24 hours: Temp:  [99 F (37.2 C)-101.9 F (38.8 C)] 100.2 F (37.9 C) (08/19 0755) Pulse Rate:  [105-135] 114 (08/19 0755) Resp:  [15-21] 19 (08/19 0755) BP: (95-155)/(55-109) 119/78 (08/19 0755) SpO2:  [87 %-99 %] 99 % (08/19 0755) Weight:  [62 kg (136 lb 9.6 oz)] 62 kg (136 lb 9.6 oz) (08/18 1725)    Intake/Output from previous day: 08/18 0701 - 08/19 0700 In: 2104.2 [I.V.:1854.2; IV Piggyback:250] Out: 300 [Urine:300] Intake/Output this shift: Total I/O In: -  Out: 100 [Urine:100]  General appearance: alert, cooperative and no distress Resp: clear to auscultation bilaterally Cardio: regular rate and rhythm, S1, S2 normal, no murmur, click, rub or gallop GI: soft; LLQ ostomy with some soft liquid stool in bag Midline wound - clean; fully granulated; minimal drainage  Lab Results:   Recent Labs  11/13/15 2203 11/14/15 1853  WBC 36.9* 29.8*  HGB 11.5* 8.8*  HCT 36.1* 27.7*  PLT 896* 675*   BMET  Recent Labs  11/13/15 2203 11/14/15 1853  NA 132* 134*  K 4.4 4.5  CL 100* 104  CO2 19* 23  GLUCOSE 185* 134*  BUN 17 21*  CREATININE 1.92* 1.38*  CALCIUM 8.4* 7.6*   PT/INR No results for input(s): LABPROT, INR in the last 72 hours. ABG No results for input(s): PHART, HCO3 in the last 72 hours.  Invalid input(s): PCO2, PO2  Studies/Results: Ct Abdomen Pelvis Wo Contrast  Result Date: 11/14/2015 CLINICAL DATA:  65 y/o M; postoperative infection. 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 History of partial sigmoid colectomy and colostomy. EXAM: CT CHEST, ABDOMEN AND PELVIS WITHOUT CONTRAST TECHNIQUE: Multidetector CT imaging of the chest, abdomen and pelvis was performed following the standard protocol without IV contrast. COMPARISON:  CT of the  abdomen and pelvis dated 11/04/2015. FINDINGS: CT CHEST FINDINGS Cardiovascular: Normal heart size. The heart is anteriorly displaced and left for rotated due to mass effect from the large hiatal hernia. Small pericardial effusion. Mediastinum/Nodes: No mediastinal lymphadenopathy or mediastinal mass. There is a large hiatal hernia containing the entirety of the partially resected stomach as well as multiple loops of small bowel from both proximal and distal to the gastrojejunal anastomosis. Lungs/Pleura: Dependent opacities in the lower lobes bilaterally is likely due to compressive atelectasis from the large hiatal hernia. There is no significant interval change. Small stable bilateral pleural effusions. Musculoskeletal: No chest wall mass or suspicious bone lesions identified. CT ABDOMEN PELVIS FINDINGS Hepatobiliary: No mass visualized on this un-enhanced exam. Pancreas: No mass or inflammatory process identified on this un-enhanced exam. Spleen: Within normal limits in size. Adrenals/Urinary Tract: No definite mass is not visualized on this unenhanced examination. There is a stable 8 mm stone at the right ureterovesicular junction without obstruction of the ureter probably within a urethral diverticulum. There is a new 3 mm nonobstructing stone within the left distal ureter in comparison with the prior CT of abdomen and pelvis (series 7, image 265). There are punctate nonobstructing stones within the kidneys bilaterally. Stomach/Bowel: Postsurgical changes related to partial gastrectomy and gastrojejunostomy. The entirety of the partially resected stomach resides in the large hiatal hernia within the posterior mediastinum. There has been herniation of the jejunostomy limb of the anastomosis into the hernia of which the degree of herniation appears to be similar in comparison with  the prior CT of abdomen and pelvis. There is new marked did thickening the of the walls of the gastrojejunostomy loop with a  thread-like passage of contrast into the more distal bowel (series 7, image 132). There has been interval marked dilatation of the hepatic limb of the native duodenum (series 7, image 193) possibly due to obstruction from increasing inflammatory changes in the gastrojejunal limb. Small ventral hernia containing small bowel without obstruction. Left abdominal colostomy with a periosteal hernia containing loops of small bowel with free passage of contrast and without evidence for obstruction. Vascular/Lymphatic: No pathologically enlarged lymph nodes. No evidence of abdominal aortic aneurysm. Reproductive: No mass or other significant abnormality. Other: Moderate volume of peritoneal ascites similar in comparison with the prior CT. Small bilateral inguinal hernias containing fat. Musculoskeletal: No acute osseous abnormality or suspicious osseous lesion. Mild degenerative changes of the spine. Lower chest: Bilateral dependent lower lobe opacities probably represent atelectasis due to the presence of large hiatal hernia. Small bilateral pleural effusions. Lungs are otherwise clear. IMPRESSION: 1. Large hiatal hernia containing the entirety of the partially resected stomach and the gastrojejunostomy limb of small bowel. The degree of herniation appears similar in comparison with the prior CT of the abdomen and pelvis. 2. Interval marked nonspecific thickening of the walls of the gastrojejunostomy limb with thread-like passage of contrast in the downstream bowel which may be due to infection, inflammation, or ischemia. 3. Interval dilatation of the native duodenum to the level of anastomosis probably due to the increased inflammatory changes of the jejunum and associated mass effect. 4. New nonobstructing 3 mm stone within the left distal ureter. Stable nonobstructing stone near the right ureterovesicular junction probably within a ureteral diverticulum. Nonobstructing stones in the kidneys bilaterally. 5. Stable  moderate peritoneal ascites. 6. Stable bilateral lower lobe partial atelectasis and small pleural effusions. 7. Stable left lower quadrant pericolostomy herniation of small bowel without appreciable obstruction and free passage of contrast. Electronically Signed   By: Kristine Garbe M.D.   On: 11/14/2015 03:07   Dg Chest 2 View  Result Date: 11/13/2015 CLINICAL DATA:  Shortness of breath and fever today, colostomy, constipation, ulcer surgery last week EXAM: CHEST  2 VIEW COMPARISON:  11/05/2015 FINDINGS: Normal heart size and pulmonary vascularity. Atherosclerotic calcification aorta. Large hiatal hernia with suspected bowel loop within hernia sac. Bibasilar atelectasis and small pleural effusions. Upper lungs clear with underlying emphysematous no pneumothorax. Bones demineralized. IMPRESSION: Hiatal hernia with suspected paraesophageal bowel herniation. Bibasilar at pleural effusions and atelectasis. Aortic atherosclerosis. Electronically Signed   By: Lavonia Dana M.D.   On: 11/13/2015 22:06   Ct Chest Wo Contrast  Result Date: 11/14/2015 CLINICAL DATA:  65 y/o M; postoperative infection. 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 History of partial sigmoid colectomy and colostomy. EXAM: CT CHEST, ABDOMEN AND PELVIS WITHOUT CONTRAST TECHNIQUE: Multidetector CT imaging of the chest, abdomen and pelvis was performed following the standard protocol without IV contrast. COMPARISON:  CT of the abdomen and pelvis dated 11/04/2015. FINDINGS: CT CHEST FINDINGS Cardiovascular: Normal heart size. The heart is anteriorly displaced and left for rotated due to mass effect from the large hiatal hernia. Small pericardial effusion. Mediastinum/Nodes: No mediastinal lymphadenopathy or mediastinal mass. There is a large hiatal hernia containing the entirety of the partially resected stomach as well as multiple loops of small bowel from both proximal and distal to  the gastrojejunal anastomosis. Lungs/Pleura: Dependent opacities in the lower lobes bilaterally is likely  due to compressive atelectasis from the large hiatal hernia. There is no significant interval change. Small stable bilateral pleural effusions. Musculoskeletal: No chest wall mass or suspicious bone lesions identified. CT ABDOMEN PELVIS FINDINGS Hepatobiliary: No mass visualized on this un-enhanced exam. Pancreas: No mass or inflammatory process identified on this un-enhanced exam. Spleen: Within normal limits in size. Adrenals/Urinary Tract: No definite mass is not visualized on this unenhanced examination. There is a stable 8 mm stone at the right ureterovesicular junction without obstruction of the ureter probably within a urethral diverticulum. There is a new 3 mm nonobstructing stone within the left distal ureter in comparison with the prior CT of abdomen and pelvis (series 7, image 265). There are punctate nonobstructing stones within the kidneys bilaterally. Stomach/Bowel: Postsurgical changes related to partial gastrectomy and gastrojejunostomy. The entirety of the partially resected stomach resides in the large hiatal hernia within the posterior mediastinum. There has been herniation of the jejunostomy limb of the anastomosis into the hernia of which the degree of herniation appears to be similar in comparison with the prior CT of abdomen and pelvis. There is new marked did thickening the of the walls of the gastrojejunostomy loop with a thread-like passage of contrast into the more distal bowel (series 7, image 132). There has been interval marked dilatation of the hepatic limb of the native duodenum (series 7, image 193) possibly due to obstruction from increasing inflammatory changes in the gastrojejunal limb. Small ventral hernia containing small bowel without obstruction. Left abdominal colostomy with a periosteal hernia containing loops of small bowel with free passage of contrast and without  evidence for obstruction. Vascular/Lymphatic: No pathologically enlarged lymph nodes. No evidence of abdominal aortic aneurysm. Reproductive: No mass or other significant abnormality. Other: Moderate volume of peritoneal ascites similar in comparison with the prior CT. Small bilateral inguinal hernias containing fat. Musculoskeletal: No acute osseous abnormality or suspicious osseous lesion. Mild degenerative changes of the spine. Lower chest: Bilateral dependent lower lobe opacities probably represent atelectasis due to the presence of large hiatal hernia. Small bilateral pleural effusions. Lungs are otherwise clear. IMPRESSION: 1. Large hiatal hernia containing the entirety of the partially resected stomach and the gastrojejunostomy limb of small bowel. The degree of herniation appears similar in comparison with the prior CT of the abdomen and pelvis. 2. Interval marked nonspecific thickening of the walls of the gastrojejunostomy limb with thread-like passage of contrast in the downstream bowel which may be due to infection, inflammation, or ischemia. 3. Interval dilatation of the native duodenum to the level of anastomosis probably due to the increased inflammatory changes of the jejunum and associated mass effect. 4. New nonobstructing 3 mm stone within the left distal ureter. Stable nonobstructing stone near the right ureterovesicular junction probably within a ureteral diverticulum. Nonobstructing stones in the kidneys bilaterally. 5. Stable moderate peritoneal ascites. 6. Stable bilateral lower lobe partial atelectasis and small pleural effusions. 7. Stable left lower quadrant pericolostomy herniation of small bowel without appreciable obstruction and free passage of contrast. Electronically Signed   By: Kristine Garbe M.D.   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 Other possibilities - pulmonary/ urinary - will ask urology to consult regarding the ureteral stones Recheck labs Continue Zosyn PICC/ TNA - patient has had minimal nutrition over the last couple of weeks.  Only taking some sips of liquids now.  LOS: 1 day    Miguel Balaguer K. 11/15/2015

## 2015-11-15 NOTE — Progress Notes (Signed)
Peripherally Inserted Central Catheter/Midline Placement  The IV Nurse has discussed with the patient and/or persons authorized to consent for the patient, the purpose of this procedure and the potential benefits and risks involved with this procedure.  The benefits include less needle sticks, lab draws from the catheter, ability to perform exchange if ordered by the physician and patient may be discharged home with the catheter.  Risks include, but not limited to, infection, bleeding, blood clot (thrombus formation), and puncture of an artery; nerve damage and irregular heat beat.  Alternatives to this procedure were also discussed.  Bard educational information given to pt.  PICC/Midline Placement Documentation  PICC Double Lumen 11/15/15 PICC Right Brachial 35 cm 0 cm (Active)  Indication for Insertion or Continuance of Line Administration of hyperosmolar/irritating solutions (i.e. TPN, Vancomycin, etc.) 11/15/2015  3:54 PM  Exposed Catheter (cm) 0 cm 11/15/2015  3:54 PM  Site Assessment Dry;Clean;Intact 11/15/2015  3:54 PM  Lumen #1 Status Flushed;Saline locked;Blood return noted 11/15/2015  3:54 PM  Lumen #2 Status Flushed;Saline locked;Blood return noted 11/15/2015  3:54 PM  Dressing Type Transparent 11/15/2015  3:54 PM  Dressing Status Clean;Dry;Intact;Antimicrobial disc in place 11/15/2015  3:54 PM  Line Care Connections checked and tightened 11/15/2015  3:54 PM  Line Adjustment (NICU/IV Team Only) No 11/15/2015  3:54 PM  Dressing Intervention New dressing 11/15/2015  3:54 PM  Dressing Change Due 11/22/15 11/15/2015  3:54 PM       Rolena Infante 11/15/2015, 3:55 PM

## 2015-11-15 NOTE — Consult Note (Signed)
Urology Consult   Physician requesting consult: Dr. Lehman Prom  Reason for consult: ureteral stone  History of Present Illness: Miguel [name] is a 65 y.o. with history of diverticulitis s/p colostomy and now s/p antrectomy with B2 anastomosis on 8/3 readmitted yesterday with abdominal pain, decreased ostomy output, fevers and nausea. He was found to have a 49mm nonobstructing distal left ureteral stone. There is a possible right UVJ stone in a ureteral diverticulum noted as well which is not obstructing.Marland Kitchen He was having mild back pain at the time of admission. UA demonstrated no evidence of infection on admission. WBC has downtrended slightly to 30 from 36. Creatinine has decreased from 1.9 on admission down to 1.2. and He remains on Zosyn. Fever curve is improving.He does have a history of 7 or 8 kidney stones which he has passed on his own. He has previously seen Dr. Risa Grill 6 or 7 years ago for elevated PSA at the time. Denies pain currently and overall feels better. Still endorses nausea which he said he been present for months.  Past Medical History:  Diagnosis Date  . Anemia   . Anxiety   . Gastritis   . GI bleed due to NSAIDs 10/27/2015  . Hyperlipidemia   . Hypertension   . Kidney stones   . 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 Sigmoid diverticulitis    with perforation    Past Surgical History:  Procedure Laterality Date  . COLON SURGERY    . ESOPHAGOGASTRODUODENOSCOPY N/A 10/27/2015   Procedure: ESOPHAGOGASTRODUODENOSCOPY (EGD);  Surgeon: Clarene Essex, MD;  Location: [facility] ENDOSCOPY;  Service: Endoscopy;  Laterality: N/A;  . ESOPHAGOGASTRODUODENOSCOPY N/A 10/29/2015   Procedure: ESOPHAGOGASTRODUODENOSCOPY (EGD);  Surgeon: Ronald Lobo, MD;  Location: [facility] ENDOSCOPY;  Service: Endoscopy;  Laterality: N/A;  . ESOPHAGOGASTRODUODENOSCOPY N/A 10/30/2015   Procedure: ESOPHAGOGASTRODUODENOSCOPY (EGD);  Surgeon: Clarene Essex, MD;  Location: [facility] ENDOSCOPY;  Service: Endoscopy;  Laterality:  N/A;  . HEMORRHOID BANDING  X 2  . KNEE CARTILAGE SURGERY Right 1971   "opened me up"  . LAPAROTOMY N/A 07/05/2015   Procedure: PARTIAL SIGMOID COLECTOMY AND COLOSTOMY;  Surgeon: [name], MD;  Location: [place];  Service: General;  Laterality: N/A;  . MELANOMA EXCISION  2001  . 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  10/30/2015   Procedure: REMOVAL OF DISTAL STOMACH;  Surgeon: Judeth Horn, MD;  Location: [place];  Service: General;;  . REPAIR OF PERFORATED ULCER N/A 10/30/2015   Procedure: REPAIR OF BLEEDING  ULCER;  Surgeon: Judeth Horn, MD;  Location: [place];  Service: General;  Laterality: N/A;  . TUMOR EXCISION  2009   "back; fatty tumor"    Current Hospital Medications:  Scheduled Meds: . enoxaparin (LOVENOX) injection  40 mg Subcutaneous Q24H  . famotidine (PEPCID) IV  20 mg Intravenous Q12H  . feeding supplement (ENSURE ENLIVE)  237 mL Oral BID BM  . piperacillin-tazobactam (ZOSYN)  IV  3.375 g Intravenous Q8H   Continuous Infusions: . 0.9 % NaCl with KCl 20 mEq / L 150 mL/hr at 11/15/15 0528   PRN Meds:.HYDROmorphone (DILAUDID) injection, ondansetron **OR** ondansetron (ZOFRAN) IV  Allergies:  Allergies  Allergen Reactions  . No Known Allergies     Family History  Problem Relation Age of Onset  . Stroke Mother   . Stroke Brother   . Heart disease Brother     Social History:  reports that he has never smoked. He quit smokeless tobacco use about 4  months ago. His smokeless tobacco use included Snuff. He reports that he does not drink alcohol or use drugs.  ROS: A complete review of systems was performed.  All systems are negative except for pertinent findings as noted.  Physical Exam:  Vital signs in last 24 hours: Temp:  [98.5 F (36.9 C)-101.9 F (38.8 C)] 98.5 F (36.9 C) (08/19 1234) Pulse Rate:  [98-135] 98 (08/19 1234) Resp:  [15-21] 18 (08/19 1234) BP: (95-155)/(55-109) 102/76 (08/19 1234) SpO2:  [87 %-99 %] 98 % (08/19 1234) Weight:   [62 kg (136 lb 9.6 oz)] 62 kg (136 lb 9.6 oz) (08/18 1725) Constitutional:  Alert and oriented, No acute distress Respiratory: Normal respiratory effort GI: Abdomen is soft, nontender. Upper midline incision packed with gauze. Left sided colostomy in place GU: No CVA tenderness bilaterall Neurologic: Grossly intact, no focal deficits Psychiatric: Normal mood and affect  Laboratory Data:   Recent Labs  11/13/15 2203 11/14/15 1853 11/15/15 0824  WBC 36.9* 29.8* 30.1*  HGB 11.5* 8.8* 8.8*  HCT 36.1* 27.7* 28.6*  PLT 896* 675* 655*     Recent Labs  11/13/15 2203 11/14/15 1853 11/15/15 0824  NA 132* 134* 133*  K 4.4 4.5 4.8  CL 100* 104 104  GLUCOSE 185* 134* 114*  BUN 17 21* 19  CALCIUM 8.4* 7.6* 7.6*  CREATININE 1.92* 1.38* 1.18     Results for orders placed or performed during the hospital encounter of 11/13/15 (from the past 24 hour(s))  MRSA PCR Screening     Status: None   Collection Time: 11/14/15  5:41 PM  Result Value Ref Range   MRSA by PCR NEGATIVE NEGATIVE  Basic metabolic panel     Status: Abnormal   Collection Time: 11/14/15  6:53 PM  Result Value Ref Range   Sodium 134 (L) 135 - 145 mmol/L   Potassium 4.5 3.5 - 5.1 mmol/L   Chloride 104 101 - 111 mmol/L   CO2 23 22 - 32 mmol/L   Glucose, Bld 134 (H) 65 - 99 mg/dL   BUN 21 (H) 6 - 20 mg/dL   Creatinine, Ser 1.38 (H) 0.61 - 1.24 mg/dL   Calcium 7.6 (L) 8.9 - 10.3 mg/dL   GFR calc non Af Amer 52 (L) >60 mL/min   GFR calc Af Amer >60 >60 mL/min   Anion gap 7 5 - 15  CBC     Status: Abnormal   Collection Time: 11/14/15  6:53 PM  Result Value Ref Range   WBC 29.8 (H) 4.0 - 10.5 K/uL   RBC 3.07 (L) 4.22 - 5.81 MIL/uL   Hemoglobin 8.8 (L) 13.0 - 17.0 g/dL   HCT 27.7 (L) 39.0 - 52.0 %   MCV 90.2 78.0 - 100.0 fL   MCH 28.7 26.0 - 34.0 pg   MCHC 31.8 30.0 - 36.0 g/dL   RDW 16.0 (H) 11.5 - 15.5 %   Platelets 675 (H) 150 - 400 K/uL  CBC     Status: Abnormal   Collection Time: 11/15/15  8:24 AM   Result Value Ref Range   WBC 30.1 (H) 4.0 - 10.5 K/uL   RBC 3.11 (L) 4.22 - 5.81 MIL/uL   Hemoglobin 8.8 (L) 13.0 - 17.0 g/dL   HCT 28.6 (L) 39.0 - 52.0 %   MCV 92.0 78.0 - 100.0 fL   MCH 28.3 26.0 - 34.0 pg   MCHC 30.8 30.0 - 36.0 g/dL   RDW 16.3 (H) 11.5 - 15.5 %   Platelets  655 (H) 150 - 400 K/uL  Comprehensive metabolic panel     Status: Abnormal   Collection Time: 11/15/15  8:24 AM  Result Value Ref Range   Sodium 133 (L) 135 - 145 mmol/L   Potassium 4.8 3.5 - 5.1 mmol/L   Chloride 104 101 - 111 mmol/L   CO2 21 (L) 22 - 32 mmol/L   Glucose, Bld 114 (H) 65 - 99 mg/dL   BUN 19 6 - 20 mg/dL   Creatinine, Ser 1.18 0.61 - 1.24 mg/dL   Calcium 7.6 (L) 8.9 - 10.3 mg/dL   Total Protein 5.6 (L) 6.5 - 8.1 g/dL   Albumin 1.9 (L) 3.5 - 5.0 g/dL   AST 18 15 - 41 U/L   ALT 12 (L) 17 - 63 U/L   Alkaline Phosphatase 106 38 - 126 U/L   Total Bilirubin 0.8 0.3 - 1.2 mg/dL   GFR calc non Af Amer >60 >60 mL/min   GFR calc Af Amer >60 >60 mL/min   Anion gap 8 5 - 15  Prealbumin     Status: Abnormal   Collection Time: 11/15/15 10:26 AM  Result Value Ref Range   Prealbumin 7.1 (L) 18 - 38 mg/dL  Magnesium     Status: None   Collection Time: 11/15/15 10:26 AM  Result Value Ref Range   Magnesium 2.2 1.7 - 2.4 mg/dL  Phosphorus     Status: None   Collection Time: 11/15/15 10:26 AM  Result Value Ref Range   Phosphorus 2.5 2.5 - 4.6 mg/dL  Triglycerides     Status: None   Collection Time: 11/15/15 10:26 AM  Result Value Ref Range   Triglycerides 96 <150 mg/dL  Differential     Status: Abnormal   Collection Time: 11/15/15 10:26 AM  Result Value Ref Range   Neutro Abs 27.6 (H) 1.7 - 7.7 K/uL   Lymphs Abs 0.7 0.7 - 4.0 K/uL   Monocytes Absolute 1.2 (H) 0.1 - 1.0 K/uL   Eosinophils Absolute 0.0 0.0 - 0.7 K/uL   Basophils Absolute 0.0 0.0 - 0.1 K/uL   Neutrophils Relative % 94 %   Lymphocytes Relative 2 %   Monocytes Relative 4 %   Eosinophils Relative 0 %   Basophils Relative 0 %    Smear Review MORPHOLOGY UNREMARKABLE    Recent Results (from the past 240 hour(s))  Aerobic Culture (superficial specimen)     Status: None   Collection Time: 11/07/15 12:21 PM  Result Value Ref Range Status   Specimen Description ABDOMEN  Final   Special Requests NONE  Final   Gram Stain   Final    FEW WBC PRESENT, PREDOMINANTLY PMN RARE GRAM POSITIVE COCCI IN PAIRS    Culture   Final    FEW STREPTOCOCCUS GROUP F FEW ENTEROCOCCUS SPECIES    Report Status 11/10/2015 FINAL  Final   Organism ID, Bacteria ENTEROCOCCUS SPECIES  Final      Susceptibility   Enterococcus species - MIC*    AMPICILLIN <=2 SENSITIVE Sensitive     VANCOMYCIN <=0.5 SENSITIVE Sensitive     GENTAMICIN SYNERGY SENSITIVE Sensitive     * FEW ENTEROCOCCUS SPECIES  Culture, blood (Routine x 2)     Status: None (Preliminary result)   Collection Time: 11/13/15  9:28 PM  Result Value Ref Range Status   Specimen Description BLOOD LEFT ARM  Final   Special Requests BOTTLES DRAWN AEROBIC AND ANAEROBIC 5CC  Final   Culture NO GROWTH < 24 HOURS  Final   Report Status PENDING  Incomplete  Culture, blood (Routine x 2)     Status: None (Preliminary result)   Collection Time: 11/13/15  9:35 PM  Result Value Ref Range Status   Specimen Description BLOOD RIGHT ARM  Final   Special Requests IN PEDIATRIC BOTTLE 4CC  Final   Culture NO GROWTH < 24 HOURS  Final   Report Status PENDING  Incomplete  Urine culture     Status: None   Collection Time: 11/13/15 10:04 PM  Result Value Ref Range Status   Specimen Description URINE, RANDOM  Final   Special Requests NONE  Final   Culture NO GROWTH  Final   Report Status 11/15/2015 FINAL  Final  MRSA PCR Screening     Status: None   Collection Time: 11/14/15  5:41 PM  Result Value Ref Range Status   MRSA by PCR NEGATIVE NEGATIVE Final    Comment:        The GeneXpert MRSA Assay (FDA approved for NASAL specimens only), is one component of a comprehensive MRSA  colonization surveillance program. It is not intended to diagnose MRSA infection nor to guide or monitor treatment for MRSA infections.     Renal Function:  Recent Labs  11/09/15 0308 11/13/15 2203 11/14/15 1853 11/15/15 0824  CREATININE 1.05 1.92* 1.38* 1.18   Estimated Creatinine Clearance: 54.7 mL/min (by C-G formula based on SCr of 1.18 mg/dL).  Radiologic Imaging: Ct Abdomen Pelvis Wo Contrast  Result Date: 11/14/2015 CLINICAL DATA:  65 y/o M; postoperative infection. 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 History of partial sigmoid colectomy and colostomy. EXAM: CT CHEST, ABDOMEN AND PELVIS WITHOUT CONTRAST TECHNIQUE: Multidetector CT imaging of the chest, abdomen and pelvis was performed following the standard protocol without IV contrast. COMPARISON:  CT of the abdomen and pelvis dated 11/04/2015. FINDINGS: CT CHEST FINDINGS Cardiovascular: Normal heart size. The heart is anteriorly displaced and left for rotated due to mass effect from the large hiatal hernia. Small pericardial effusion. Mediastinum/Nodes: No mediastinal lymphadenopathy or mediastinal mass. There is a large hiatal hernia containing the entirety of the partially resected stomach as well as multiple loops of small bowel from both proximal and distal to the gastrojejunal anastomosis. Lungs/Pleura: Dependent opacities in the lower lobes bilaterally is likely due to compressive atelectasis from the large hiatal hernia. There is no significant interval change. Small stable bilateral pleural effusions. Musculoskeletal: No chest wall mass or suspicious bone lesions identified. CT ABDOMEN PELVIS FINDINGS Hepatobiliary: No mass visualized on this un-enhanced exam. Pancreas: No mass or inflammatory process identified on this un-enhanced exam. Spleen: Within normal limits in size. Adrenals/Urinary Tract: No definite mass is not visualized on this unenhanced examination. There  is a stable 8 mm stone at the right ureterovesicular junction without obstruction of the ureter probably within a urethral diverticulum. There is a new 3 mm nonobstructing stone within the left distal ureter in comparison with the prior CT of abdomen and pelvis (series 7, image 265). There are punctate nonobstructing stones within the kidneys bilaterally. Stomach/Bowel: Postsurgical changes related to partial gastrectomy and gastrojejunostomy. The entirety of the partially resected stomach resides in the large hiatal hernia within the posterior mediastinum. There has been herniation of the jejunostomy limb of the anastomosis into the hernia of which the degree of herniation appears to be similar in comparison with the prior CT of abdomen and pelvis. There is new marked did thickening the of the walls of  the gastrojejunostomy loop with a thread-like passage of contrast into the more distal bowel (series 7, image 132). There has been interval marked dilatation of the hepatic limb of the native duodenum (series 7, image 193) possibly due to obstruction from increasing inflammatory changes in the gastrojejunal limb. Small ventral hernia containing small bowel without obstruction. Left abdominal colostomy with a periosteal hernia containing loops of small bowel with free passage of contrast and without evidence for obstruction. Vascular/Lymphatic: No pathologically enlarged lymph nodes. No evidence of abdominal aortic aneurysm. Reproductive: No mass or other significant abnormality. Other: Moderate volume of peritoneal ascites similar in comparison with the prior CT. Small bilateral inguinal hernias containing fat. Musculoskeletal: No acute osseous abnormality or suspicious osseous lesion. Mild degenerative changes of the spine. Lower chest: Bilateral dependent lower lobe opacities probably represent atelectasis due to the presence of large hiatal hernia. Small bilateral pleural effusions. Lungs are otherwise clear.  IMPRESSION: 1. Large hiatal hernia containing the entirety of the partially resected stomach and the gastrojejunostomy limb of small bowel. The degree of herniation appears similar in comparison with the prior CT of the abdomen and pelvis. 2. Interval marked nonspecific thickening of the walls of the gastrojejunostomy limb with thread-like passage of contrast in the downstream bowel which may be due to infection, inflammation, or ischemia. 3. Interval dilatation of the native duodenum to the level of anastomosis probably due to the increased inflammatory changes of the jejunum and associated mass effect. 4. New nonobstructing 3 mm stone within the left distal ureter. Stable nonobstructing stone near the right ureterovesicular junction probably within a ureteral diverticulum. Nonobstructing stones in the kidneys bilaterally. 5. Stable moderate peritoneal ascites. 6. Stable bilateral lower lobe partial atelectasis and small pleural effusions. 7. Stable left lower quadrant pericolostomy herniation of small bowel without appreciable obstruction and free passage of contrast. Electronically Signed   By: Kristine Garbe M.D.   On: 11/14/2015 03:07   Dg Chest 2 View  Result Date: 11/13/2015 CLINICAL DATA:  Shortness of breath and fever today, colostomy, constipation, ulcer surgery last week EXAM: CHEST  2 VIEW COMPARISON:  11/05/2015 FINDINGS: Normal heart size and pulmonary vascularity. Atherosclerotic calcification aorta. Large hiatal hernia with suspected bowel loop within hernia sac. Bibasilar atelectasis and small pleural effusions. Upper lungs clear with underlying emphysematous no pneumothorax. Bones demineralized. IMPRESSION: Hiatal hernia with suspected paraesophageal bowel herniation. Bibasilar at pleural effusions and atelectasis. Aortic atherosclerosis. Electronically Signed   By: Lavonia Dana M.D.   On: 11/13/2015 22:06   Ct Chest Wo Contrast  Result Date: 11/14/2015 CLINICAL DATA:  65 y/o M;  postoperative infection. 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 History of partial sigmoid colectomy and colostomy. EXAM: CT CHEST, ABDOMEN AND PELVIS WITHOUT CONTRAST TECHNIQUE: Multidetector CT imaging of the chest, abdomen and pelvis was performed following the standard protocol without IV contrast. COMPARISON:  CT of the abdomen and pelvis dated 11/04/2015. FINDINGS: CT CHEST FINDINGS Cardiovascular: Normal heart size. The heart is anteriorly displaced and left for rotated due to mass effect from the large hiatal hernia. Small pericardial effusion. Mediastinum/Nodes: No mediastinal lymphadenopathy or mediastinal mass. There is a large hiatal hernia containing the entirety of the partially resected stomach as well as multiple loops of small bowel from both proximal and distal to the gastrojejunal anastomosis. Lungs/Pleura: Dependent opacities in the lower lobes bilaterally is likely due to compressive atelectasis from the large hiatal hernia. There is no significant interval change. Small stable bilateral pleural  effusions. Musculoskeletal: No chest wall mass or suspicious bone lesions identified. CT ABDOMEN PELVIS FINDINGS Hepatobiliary: No mass visualized on this un-enhanced exam. Pancreas: No mass or inflammatory process identified on this un-enhanced exam. Spleen: Within normal limits in size. Adrenals/Urinary Tract: No definite mass is not visualized on this unenhanced examination. There is a stable 8 mm stone at the right ureterovesicular junction without obstruction of the ureter probably within a urethral diverticulum. There is a new 3 mm nonobstructing stone within the left distal ureter in comparison with the prior CT of abdomen and pelvis (series 7, image 265). There are punctate nonobstructing stones within the kidneys bilaterally. Stomach/Bowel: Postsurgical changes related to partial gastrectomy and gastrojejunostomy. The entirety of the partially  resected stomach resides in the large hiatal hernia within the posterior mediastinum. There has been herniation of the jejunostomy limb of the anastomosis into the hernia of which the degree of herniation appears to be similar in comparison with the prior CT of abdomen and pelvis. There is new marked did thickening the of the walls of the gastrojejunostomy loop with a thread-like passage of contrast into the more distal bowel (series 7, image 132). There has been interval marked dilatation of the hepatic limb of the native duodenum (series 7, image 193) possibly due to obstruction from increasing inflammatory changes in the gastrojejunal limb. Small ventral hernia containing small bowel without obstruction. Left abdominal colostomy with a periosteal hernia containing loops of small bowel with free passage of contrast and without evidence for obstruction. Vascular/Lymphatic: No pathologically enlarged lymph nodes. No evidence of abdominal aortic aneurysm. Reproductive: No mass or other significant abnormality. Other: Moderate volume of peritoneal ascites similar in comparison with the prior CT. Small bilateral inguinal hernias containing fat. Musculoskeletal: No acute osseous abnormality or suspicious osseous lesion. Mild degenerative changes of the spine. Lower chest: Bilateral dependent lower lobe opacities probably represent atelectasis due to the presence of large hiatal hernia. Small bilateral pleural effusions. Lungs are otherwise clear. IMPRESSION: 1. Large hiatal hernia containing the entirety of the partially resected stomach and the gastrojejunostomy limb of small bowel. The degree of herniation appears similar in comparison with the prior CT of the abdomen and pelvis. 2. Interval marked nonspecific thickening of the walls of the gastrojejunostomy limb with thread-like passage of contrast in the downstream bowel which may be due to infection, inflammation, or ischemia. 3. Interval dilatation of the native  duodenum to the level of anastomosis probably due to the increased inflammatory changes of the jejunum and associated mass effect. 4. New nonobstructing 3 mm stone within the left distal ureter. Stable nonobstructing stone near the right ureterovesicular junction probably within a ureteral diverticulum. Nonobstructing stones in the kidneys bilaterally. 5. Stable moderate peritoneal ascites. 6. Stable bilateral lower lobe partial atelectasis and small pleural effusions. 7. Stable left lower quadrant pericolostomy herniation of small bowel without appreciable obstruction and free passage of contrast. Electronically Signed   By: Kristine Garbe M.D.   On: 11/14/2015 03:07    I independently reviewed the above imaging studies.  Impression/Recommendation 65 year old male s/p antrectomy with B2 anastomosis admitted with abdominal pain, nausea, fever, and decreased ostomy output still with persistent leukocytosis. His stones appears nonobstructing on CT imaging and his urine demonstrates no evidence of infection so the small ureteral stones are likely not the source of his leukocytosis. There is a good chance he should be able to pass the stone without intervention  - Recommend flomax 0.4 daily and good hydration - He  should strain his urine with strainer to try and catch any passed stone - We will arrange outpatient follow up with urology  Lolita Rieger 11/15/2015, 1:16 PM   Patient was seen, examined,treatment plan was discussed with the resident.  I have directly reviewed the clinical findings, lab, imaging studies and management of this patient in detail. I have made the necessary changes and/or additions to the above noted documentation, and agree with the documentation, as recorded by the resident.

## 2015-11-16 LAB — BASIC METABOLIC PANEL
Anion gap: 9 (ref 5–15)
BUN: 14 mg/dL (ref 6–20)
CHLORIDE: 106 mmol/L (ref 101–111)
CO2: 22 mmol/L (ref 22–32)
CREATININE: 0.92 mg/dL (ref 0.61–1.24)
Calcium: 7.7 mg/dL — ABNORMAL LOW (ref 8.9–10.3)
GFR calc non Af Amer: >60 mL/min (ref >60)
Glucose, Bld: 109 mg/dL — ABNORMAL HIGH (ref 65–99)
POTASSIUM: 4.6 mmol/L (ref 3.5–5.1)
SODIUM: 137 mmol/L (ref 135–145)

## 2015-11-16 LAB — GLUCOSE, CAPILLARY
GLUCOSE-CAPILLARY: 122 mg/dL — AB (ref 65–99)
Glucose-Capillary: 105 mg/dL — ABNORMAL HIGH (ref 65–99)
Glucose-Capillary: 152 mg/dL — ABNORMAL HIGH (ref 65–99)
Glucose-Capillary: 112 mg/dL — ABNORMAL HIGH (ref 65–99)

## 2015-11-16 LAB — MAGNESIUM: MAGNESIUM: 2 mg/dL (ref 1.7–2.4)

## 2015-11-16 LAB — PHOSPHORUS: PHOSPHORUS: 1.9 mg/dL — AB (ref 2.5–4.6)

## 2015-11-16 MED ORDER — ACETAMINOPHEN 10 MG/ML IV SOLN
1000.0000 mg | Freq: Once | INTRAVENOUS | Status: AC
  Administered 2015-11-16: 1000 mg via INTRAVENOUS
  Filled 2015-11-16: qty 100

## 2015-11-16 MED ORDER — FAT EMULSION 20 % IV EMUL
100.0000 mL | INTRAVENOUS | Status: DC
  Filled 2015-11-16: qty 100

## 2015-11-16 MED ORDER — POTASSIUM CHLORIDE IN NACL 20-0.9 MEQ/L-% IV SOLN
INTRAVENOUS | Status: AC
  Administered 2015-11-16 – 2015-11-17 (×3): via INTRAVENOUS
  Filled 2015-11-16 (×3): qty 1000

## 2015-11-16 MED ORDER — TRACE MINERALS CR-CU-MN-SE-ZN 10-1000-500-60 MCG/ML IV SOLN
INTRAVENOUS | Status: DC
  Filled 2015-11-16: qty 300

## 2015-11-16 MED ORDER — INSULIN ASPART 100 UNIT/ML ~~LOC~~ SOLN
0.0000 [IU] | SUBCUTANEOUS | Status: DC
  Administered 2015-11-16: 1 [IU] via SUBCUTANEOUS
  Administered 2015-11-16: 2 [IU] via SUBCUTANEOUS
  Administered 2015-11-17 (×2): 1 [IU] via SUBCUTANEOUS
  Administered 2015-11-17 – 2015-11-18 (×4): 2 [IU] via SUBCUTANEOUS
  Administered 2015-11-18 – 2015-11-19 (×6): 1 [IU] via SUBCUTANEOUS
  Administered 2015-11-19: 2 [IU] via SUBCUTANEOUS
  Administered 2015-11-19 – 2015-11-20 (×5): 1 [IU] via SUBCUTANEOUS
  Administered 2015-11-20: 2 [IU] via SUBCUTANEOUS
  Administered 2015-11-20: 1 [IU] via SUBCUTANEOUS
  Administered 2015-11-20 – 2015-11-21 (×3): 2 [IU] via SUBCUTANEOUS
  Administered 2015-11-21 – 2015-11-22 (×3): 1 [IU] via SUBCUTANEOUS
  Administered 2015-11-22: 2 [IU] via SUBCUTANEOUS
  Administered 2015-11-22 (×3): 1 [IU] via SUBCUTANEOUS
  Administered 2015-11-22: 2 [IU] via SUBCUTANEOUS
  Administered 2015-11-23 (×4): 1 [IU] via SUBCUTANEOUS
  Administered 2015-11-23: 2 [IU] via SUBCUTANEOUS
  Administered 2015-11-24 (×2): 1 [IU] via SUBCUTANEOUS

## 2015-11-16 MED ORDER — CLINIMIX E/DEXTROSE (5/15) 5 % IV SOLN
INTRAVENOUS | Status: AC
  Administered 2015-11-16: 09:00:00 via INTRAVENOUS
  Filled 2015-11-16: qty 300

## 2015-11-16 MED ORDER — TRACE MINERALS CR-CU-MN-SE-ZN 10-1000-500-60 MCG/ML IV SOLN
INTRAVENOUS | Status: AC
  Administered 2015-11-16: 18:00:00 via INTRAVENOUS
  Filled 2015-11-16: qty 720

## 2015-11-16 MED ORDER — FAT EMULSION 20 % IV EMUL
100.0000 mL | INTRAVENOUS | Status: AC
  Administered 2015-11-16: 100 mL via INTRAVENOUS
  Filled 2015-11-16: qty 100

## 2015-11-16 MED ORDER — FAT EMULSION 20 % IV EMUL
240.0000 mL | INTRAVENOUS | Status: AC
  Administered 2015-11-16: 240 mL via INTRAVENOUS
  Filled 2015-11-16: qty 250

## 2015-11-16 MED ORDER — SODIUM PHOSPHATES 45 MMOLE/15ML IV SOLN
20.0000 mmol | Freq: Once | INTRAVENOUS | Status: AC
  Administered 2015-11-16: 20 mmol via INTRAVENOUS
  Filled 2015-11-16: qty 6.67

## 2015-11-16 NOTE — Progress Notes (Signed)
PARENTERAL NUTRITION CONSULT NOTE - FOLLOW UP  Pharmacy Consult for TPN Indication: Intolerance to enteral feeding  Allergies  Allergen Reactions  . No Known Allergies     Patient Measurements: Height: '5\' 6"'  (167.6 cm) Weight: 136 lb 9.6 oz (62 kg) IBW/kg (Calculated) : 63.8 Weight last admit: 77.8 kg  Usual weight: ~79.5 kg  Vital Signs: Temp: 98.6 F (37 C) (08/20 0331) Temp Source: Oral (08/20 0331) BP: 122/73 (08/20 0331) Pulse Rate: 105 (08/20 0331) Intake/Output from previous day: 08/19 0701 - 08/20 0700 In: 3720 [P.O.:120; I.V.:3300; IV Piggyback:300] Out: 425 [Urine:425] Intake/Output from this shift: No intake/output data recorded.  Labs:  Recent Labs  11/13/15 2203 11/14/15 1853 11/15/15 0824  WBC 36.9* 29.8* 30.1*  HGB 11.5* 8.8* 8.8*  HCT 36.1* 27.7* 28.6*  PLT 896* 675* 655*     Recent Labs  11/13/15 2203 11/14/15 1853 11/15/15 0824 11/15/15 1026 11/16/15 0300  NA 132* 134* 133*  --  137  K 4.4 4.5 4.8  --  4.6  CL 100* 104 104  --  106  CO2 19* 23 21*  --  22  GLUCOSE 185* 134* 114*  --  109*  BUN 17 21* 19  --  14  CREATININE 1.92* 1.38* 1.18  --  0.92  CALCIUM 8.4* 7.6* 7.6*  --  7.7*  MG  --   --   --  2.2 2.0  PHOS  --   --   --  2.5 1.9*  PROT 6.9  --  5.6*  --   --   ALBUMIN 2.5*  --  1.9*  --   --   AST 15  --  18  --   --   ALT 15*  --  12*  --   --   ALKPHOS 92  --  106  --   --   BILITOT 0.5  --  0.8  --   --   PREALBUMIN  --   --   --  7.1*  --   TRIG  --   --   --  96  --    Estimated Creatinine Clearance: 70.2 mL/min (by C-G formula based on SCr of 0.92 mg/dL).   No results for input(s): GLUCAP in the last 72 hours.  Medical History: Past Medical History:  Diagnosis Date  . Anemia   . Anxiety   . Gastritis   . GI bleed due to NSAIDs 10/27/2015  . Hyperlipidemia   . Hypertension   . Kidney stones   . 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 Sigmoid diverticulitis    with perforation    Medications:   Scheduled:  . enoxaparin (LOVENOX) injection  40 mg Subcutaneous Q24H  . famotidine (PEPCID) IV  20 mg Intravenous Q12H  . piperacillin-tazobactam (ZOSYN)  IV  3.375 g Intravenous Q8H  . sodium chloride flush  10-40 mL Intracatheter Q12H  . tamsulosin  0.4 mg Oral QPC supper    Insulin Requirements in the past 24 hours:  none  Admit: 65 y/o male who presented to ED 11/13/2015 with fever and N/V admitted with sepsis. He has a recent discharge after repair of a bleeding ulcer and was on TPN that admission. Repeat CT with thickening of the gastrojejunostomy limb of his anastomosis as well as the duodenum, no leak, no increase in size of hiatal hernia, non obstructing distal left ureteral stone. Urology to be consulted. MD reports minimal nutrition over last couple weeks.  Assessment: 15.8 kg weight loss since 8/12. He reports minimal oral intake since discharge on 8/13 and not much appetite. At risk for refeeding syndrome.   Surgeries/Procedures: 8/3: Billroth II repair, partial gastrectomy for giant ulcer 06/2015: perforated sigmoid diverticulitis s/p colostomy   GI: (+)nausea, spitting up, ostomy output less per CCS. Albumin 1.9. Pepcid IV, Ensure bid Endo: no hx DM, CBGs <150 Lytes: Na 133, K 4.6 (72 meq/24 hrs from IVF), CoCa 9.3, Phos 1.9, Mg 2 Renal: AKI, SCr 1.38, UOP not accurately recorded. NS + 20K @ 150 ml/hr ; ureteral stone should pass w/o intervention per Uro, tamsulosin added Pulm: RA Cards: BP soft, tachy Hepatobil: LFTs wnl, tbili wnl, alk phos wnl, Palb 7.1, TG 96 Neuro: intact ID: Zosyn 8/17>> for sepsis, ?enteritis, no sign intra-abd process, wound clean per CCS, Tm 100.4, WBC up to 30.1 Best Practices: enox 40  TPN Access: Double lumen PICC 8/19 TPN start date: 8/20>>  Current Nutrition:  Clear liquids  Nutritional Goals: per RD note 8/19 1800-2000 kCal,  85-100grams of protein per day Clinimix E 5/15 at 80 ml/hr and 20% lipid emulsion at 10 ml/hr. This  provides 96 g of protein and 1843 kCals per day meeting 100% of protein and kCal needs  Plan:  Begin Clinimix E 5/15 at 30 ml/hr and add 20% lipid emulsion at 10 ml/hr. This provides 36 g of protein and 871 kCals per day meeting 42% of minimal protein and 48% of kCal needs Add MVI and TE in TPN Add famotidine 40 mg to TPN, resume IV or PO when TPN d/c'd NaPhos 20 mmol IV x1 Decrease NS + 20K to 120 ml/hr Add sensitive q4h SSI and adjust as needed Monitor TPN labs qMon/Thurs   Renold Genta, PharmD, BCPS Clinical Pharmacist Phone for today - [place] - [phone] 11/16/2015 7:19 AM

## 2015-11-16 NOTE — Progress Notes (Signed)
Pharmacy notified that PICC is successfully placed and ready for TPN as ordered.

## 2015-11-16 NOTE — Progress Notes (Signed)
Report called to [place] - spoke to [employer]. Pt will be transferred to [place] bed 21

## 2015-11-16 NOTE — Progress Notes (Signed)
Patient ID: Miguel [name], male   DOB: 04/16/1950, 65 y.o.   MRN: [id number]  Assessment: Bilateral distal ureteral calculi - He has a 3 mm stone on the right-hand side that is not causing any flank pain. He has not passed the stone yet but has passed about 7-8 over the years and has never required any form of surgical intervention. He also has a group of stones in the distal left ureter that also are not causing any obstruction. 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 At this point I do not feel that with a negative urine culture and no evidence of obstruction that his stones are in any way contributing to his leukocytosis. There is currently no indication for ureteral stenting. Continue medical expulsive therapy will likely result in passage of his right ureteral stone however we discussed today the fact that he may need to undergo ureteroscopic management of his left ureteral stones on an elective basis at some point.  Plan:1. Continue medical expulsive therapy with tamsulosin. 2. Strain all urine. 3. He will follow-up as an outpatient for treatment of his ureteral calculi.    Subjective: Patient reports He is not having any flank pain. He said he has passed multiple stones and has no pain that would suggest kidney stone pain/renal colic. He said he does have some pain in the center of his back in the lumbar region. He has not seen a stone pass. He denies any voiding symptoms or hematuria.  Objective: Vital signs in last 24 hours: Temp:  [98.5 F (36.9 C)-100.4 F (38 C)] 98.8 F (37.1 C) (08/20 0700) Pulse Rate:  [55-105] 99 (08/20 0700) Resp:  [14-20] 20 (08/20 0700) BP: (102-122)/(73-77) 114/76 (08/20 0700) SpO2:  [91 %-100 %] 97 % (08/20 0700)A  Intake/Output from previous day: 08/19 0701 - 08/20 0700 In: 3720 [P.O.:120; I.V.:3300; IV Piggyback:300] Out: 825 [Urine:825] Intake/Output this shift: No  intake/output data recorded.  Past Medical History:  Diagnosis Date  . Anemia   . Anxiety   . Gastritis   . GI bleed due to NSAIDs 10/27/2015  . Hyperlipidemia   . Hypertension   . Kidney stones   . 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 Sigmoid diverticulitis    with perforation    Physical Exam:  Lungs - Normal respiratory effort, chest expands symmetrically.  Abdomen - Soft, non-tender & non-distended.  Lab Results:  Recent Labs  11/13/15 2203 11/14/15 1853 11/15/15 0824  WBC 36.9* 29.8* 30.1*  HGB 11.5* 8.8* 8.8*  HCT 36.1* 27.7* 28.6*   BMET  Recent Labs  11/15/15 0824 11/16/15 0300  NA 133* 137  K 4.8 4.6  CL 104 106  CO2 21* 22  GLUCOSE 114* 109*  BUN 19 14  CREATININE 1.18 0.92  CALCIUM 7.6* 7.7*   No results for input(s): LABURIN in the last 72 hours. Results for orders placed or performed during the hospital encounter of 11/13/15  Culture, blood (Routine x 2)     Status: None (Preliminary result)   Collection Time: 11/13/15  9:28 PM  Result Value Ref Range Status   Specimen Description BLOOD LEFT ARM  Final   Special Requests BOTTLES DRAWN AEROBIC AND ANAEROBIC 5CC  Final   Culture NO GROWTH 2 DAYS  Final   Report Status PENDING  Incomplete  Culture, blood (Routine x 2)     Status: None (Preliminary result)  Collection Time: 11/13/15  9:35 PM  Result Value Ref Range Status   Specimen Description BLOOD RIGHT ARM  Final   Special Requests IN PEDIATRIC BOTTLE 4CC  Final   Culture NO GROWTH 2 DAYS  Final   Report Status PENDING  Incomplete  Urine culture     Status: None   Collection Time: 11/13/15 10:04 PM  Result Value Ref Range Status   Specimen Description URINE, RANDOM  Final   Special Requests NONE  Final   Culture NO GROWTH  Final   Report Status 11/15/2015 FINAL  Final  MRSA PCR Screening     Status: None   Collection Time: 11/14/15  5:41 PM  Result Value Ref Range Status   MRSA by PCR NEGATIVE NEGATIVE Final    Comment:         The GeneXpert MRSA Assay (FDA approved for NASAL specimens only), is one component of a comprehensive MRSA colonization surveillance program. It is not intended to diagnose MRSA infection nor to guide or monitor treatment for MRSA infections.     Studies/Results: No results found.    Amanda Pote C 11/16/2015, 7:55 AM

## 2015-11-16 NOTE — Progress Notes (Signed)
Pt transferred to [place] bed 21 per recliner with all belongings. Pt alert - family aware of transfer and took belongings with them.

## 2015-11-16 NOTE — Progress Notes (Signed)
  Subjective: PT doing well this AM NAE  Objective: Vital signs in last 24 hours: Temp:  [98.5 F (36.9 C)-100.4 F (38 C)] 98.8 F (37.1 C) (08/20 0700) Pulse Rate:  [55-114] 99 (08/20 0700) Resp:  [14-20] 20 (08/20 0700) BP: (102-122)/(73-78) 114/76 (08/20 0700) SpO2:  [91 %-100 %] 97 % (08/20 0700) Last BM Date:  (Colostomy)  Intake/Output from previous day: 08/19 0701 - 08/20 0700 In: 3720 [P.O.:120; I.V.:3300; IV Piggyback:300] Out: 825 [Urine:825] Intake/Output this shift: No intake/output data recorded.  General appearance: alert and cooperative GI: soft, non-tender; bowel sounds normal; no masses,  no organomegaly  Lab Results:   Recent Labs  11/14/15 1853 11/15/15 0824  WBC 29.8* 30.1*  HGB 8.8* 8.8*  HCT 27.7* 28.6*  PLT 675* 655*   BMET  Recent Labs  11/15/15 0824 11/16/15 0300  NA 133* 137  K 4.8 4.6  CL 104 106  CO2 21* 22  GLUCOSE 114* 109*  BUN 19 14  CREATININE 1.18 0.92  CALCIUM 7.6* 7.7*    Anti-infectives: Anti-infectives    Start     Dose/Rate Route Frequency Ordered Stop   11/14/15 0600  piperacillin-tazobactam (ZOSYN) IVPB 3.375 g     3.375 g 12.5 mL/hr over 240 Minutes Intravenous Every 8 hours 11/13/15 2328     11/13/15 2300  piperacillin-tazobactam (ZOSYN) IVPB 3.375 g     3.375 g 100 mL/hr over 30 Minutes Intravenous  Once 11/13/15 2257 11/14/15 0217      Assessment/Plan: 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

## 2015-11-17 ENCOUNTER — Inpatient Hospital Stay (HOSPITAL_COMMUNITY): Payer: 59

## 2015-11-17 DIAGNOSIS — E43 Unspecified severe protein-calorie malnutrition: Secondary | ICD-10-CM

## 2015-11-17 LAB — DIFFERENTIAL
Basophils Absolute: 0 10*3/uL (ref 0.0–0.1)
Basophils Relative: 0 %
EOS PCT: 2 %
Eosinophils Absolute: 0.3 10*3/uL (ref 0.0–0.7)
LYMPHS ABS: 0.6 10*3/uL — AB (ref 0.7–4.0)
LYMPHS PCT: 4 %
MONO ABS: 2 10*3/uL — AB (ref 0.1–1.0)
MONOS PCT: 14 %
NEUTROS ABS: 11.1 10*3/uL — AB (ref 1.7–7.7)
Neutrophils Relative %: 80 %

## 2015-11-17 LAB — CBC
HEMATOCRIT: 23.1 % — AB (ref 39.0–52.0)
HEMOGLOBIN: 7.1 g/dL — AB (ref 13.0–17.0)
MCH: 28.1 pg (ref 26.0–34.0)
MCHC: 30.7 g/dL (ref 30.0–36.0)
MCV: 91.3 fL (ref 78.0–100.0)
Platelets: 504 10*3/uL — ABNORMAL HIGH (ref 150–400)
RBC: 2.53 MIL/uL — ABNORMAL LOW (ref 4.22–5.81)
RDW: 16.6 % — AB (ref 11.5–15.5)
WBC: 14 10*3/uL — ABNORMAL HIGH (ref 4.0–10.5)

## 2015-11-17 LAB — COMPREHENSIVE METABOLIC PANEL
ALK PHOS: 74 U/L (ref 38–126)
ALT: 11 U/L — AB (ref 17–63)
AST: 12 U/L — AB (ref 15–41)
Albumin: 1.6 g/dL — ABNORMAL LOW (ref 3.5–5.0)
Anion gap: 8 (ref 5–15)
BUN: 7 mg/dL (ref 6–20)
CALCIUM: 7.4 mg/dL — AB (ref 8.9–10.3)
CHLORIDE: 104 mmol/L (ref 101–111)
CO2: 22 mmol/L (ref 22–32)
CREATININE: 0.83 mg/dL (ref 0.61–1.24)
GFR calc Af Amer: >60 mL/min (ref >60)
GFR calc non Af Amer: >60 mL/min (ref >60)
Glucose, Bld: 117 mg/dL — ABNORMAL HIGH (ref 65–99)
Potassium: 3.9 mmol/L (ref 3.5–5.1)
SODIUM: 134 mmol/L — AB (ref 135–145)
Total Bilirubin: 0.3 mg/dL (ref 0.3–1.2)
Total Protein: 4.9 g/dL — ABNORMAL LOW (ref 6.5–8.1)

## 2015-11-17 LAB — MAGNESIUM: Magnesium: 1.7 mg/dL (ref 1.7–2.4)

## 2015-11-17 LAB — GLUCOSE, CAPILLARY
GLUCOSE-CAPILLARY: 117 mg/dL — AB (ref 65–99)
Glucose-Capillary: 106 mg/dL — ABNORMAL HIGH (ref 65–99)
Glucose-Capillary: 137 mg/dL — ABNORMAL HIGH (ref 65–99)
Glucose-Capillary: 147 mg/dL — ABNORMAL HIGH (ref 65–99)
Glucose-Capillary: 157 mg/dL — ABNORMAL HIGH (ref 65–99)

## 2015-11-17 LAB — PREALBUMIN: PREALBUMIN: 4.4 mg/dL — AB (ref 18–38)

## 2015-11-17 LAB — TRIGLYCERIDES: Triglycerides: 98 mg/dL (ref <150)

## 2015-11-17 LAB — PHOSPHORUS: Phosphorus: 2 mg/dL — ABNORMAL LOW (ref 2.5–4.6)

## 2015-11-17 MED ORDER — ACETAMINOPHEN 500 MG PO TABS
500.0000 mg | ORAL_TABLET | Freq: Four times a day (QID) | ORAL | Status: DC | PRN
  Administered 2015-11-17 – 2015-11-20 (×5): 500 mg via ORAL
  Filled 2015-11-17 (×5): qty 1

## 2015-11-17 MED ORDER — FAT EMULSION 20 % IV EMUL
240.0000 mL | INTRAVENOUS | Status: AC
  Administered 2015-11-17: 240 mL via INTRAVENOUS
  Filled 2015-11-17: qty 250

## 2015-11-17 MED ORDER — POTASSIUM CHLORIDE IN NACL 20-0.9 MEQ/L-% IV SOLN
INTRAVENOUS | Status: AC
  Administered 2015-11-18 (×2): via INTRAVENOUS
  Filled 2015-11-17: qty 1000

## 2015-11-17 MED ORDER — OXYCODONE HCL 5 MG PO TABS
5.0000 mg | ORAL_TABLET | ORAL | Status: DC | PRN
  Administered 2015-11-17 – 2015-11-20 (×9): 5 mg via ORAL
  Filled 2015-11-17 (×10): qty 1

## 2015-11-17 MED ORDER — M.V.I. ADULT IV INJ
INJECTION | INTRAVENOUS | Status: AC
  Administered 2015-11-17: 17:00:00 via INTRAVENOUS
  Filled 2015-11-17: qty 1200

## 2015-11-17 MED ORDER — SODIUM PHOSPHATES 45 MMOLE/15ML IV SOLN
20.0000 mmol | Freq: Once | INTRAVENOUS | Status: AC
  Administered 2015-11-17: 20 mmol via INTRAVENOUS
  Filled 2015-11-17: qty 6.67

## 2015-11-17 NOTE — Progress Notes (Signed)
Pt's temp was 101.3.  PA notified and awaiting CBC results.  No new orders received at this time.  Will continue to monitor.  Eliezer Bottom [place]

## 2015-11-17 NOTE — Consult Note (Signed)
   [facility] CM Inpatient Consult   11/17/2015  KEITHEN WOOLSON 1950-09-07 [id number]   Went to bedside to speak with Mr. Salasar on behalf of Link to [facility] Care Management program for [facility] employees dependents with [facility] insurance. Mrs. Fagg was at bedside during bedside visit. Both are already aware of program. [place] made contact after last hospitalization. Even though, Mr Nelli currently denies Link to Wellness needs, he is agreeable to post hospital discharge call. He gave permission for his wife to be contacted on his behalf as well. Contact information provided. Expressed appreciation of visit. Will make inpatient RNCM aware of bedside visit.   [name], MSN-Ed, RN,BSN [facility] Liaison [phone]

## 2015-11-17 NOTE — Progress Notes (Signed)
PARENTERAL NUTRITION CONSULT NOTE - FOLLOW UP  Pharmacy Consult for TPN Indication: Intolerance to enteral feeding  Allergies  Allergen Reactions  . No Known Allergies     Patient Measurements: Height: '5\' 6"'  (167.6 cm) Weight: 136 lb 9.6 oz (62 kg) IBW/kg (Calculated) : 63.8 Weight last admit: 77.8 kg  Usual weight: ~79.5 kg  Vital Signs: Temp: 99.2 F (37.3 C) (08/21 0403) Temp Source: Oral (08/21 0403) BP: 132/74 (08/21 0403) Pulse Rate: 112 (08/21 0403) Intake/Output from previous day: 08/20 0701 - 08/21 0700 In: 3541.2 [P.O.:50; I.V.:3106.7; IV Piggyback:384.5] Out: 1600 [Urine:1600] Intake/Output from this shift: Total I/O In: -  Out: 100 [Urine:100]  Labs:  Recent Labs  11/14/15 1853 11/15/15 0824  WBC 29.8* 30.1*  HGB 8.8* 8.8*  HCT 27.7* 28.6*  PLT 675* 655*     Recent Labs  11/15/15 0824 11/15/15 1026 11/16/15 0300 11/17/15 0602  NA 133*  --  137 134*  K 4.8  --  4.6 3.9  CL 104  --  106 104  CO2 21*  --  22 22  GLUCOSE 114*  --  109* 117*  BUN 19  --  14 7  CREATININE 1.18  --  0.92 0.83  CALCIUM 7.6*  --  7.7* 7.4*  MG  --  2.2 2.0 1.7  PHOS  --  2.5 1.9* 2.0*  PROT 5.6*  --   --  4.9*  ALBUMIN 1.9*  --   --  1.6*  AST 18  --   --  12*  ALT 12*  --   --  11*  ALKPHOS 106  --   --  74  BILITOT 0.8  --   --  0.3  PREALBUMIN  --  7.1*  --  4.4*  TRIG  --  96  --  98   Estimated Creatinine Clearance: 77.8 mL/min (by C-G formula based on SCr of 0.83 mg/dL).    Recent Labs  11/16/15 2349 11/17/15 0402 11/17/15 0736  GLUCAP 122* 106* 117*    Medical History: Past Medical History:  Diagnosis Date  . Anemia   . Anxiety   . Gastritis   . GI bleed due to NSAIDs 10/27/2015  . Hyperlipidemia   . Hypertension   . Kidney stones   . 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 Sigmoid diverticulitis    with perforation    Medications:  Scheduled:  . enoxaparin (LOVENOX) injection  40 mg Subcutaneous Q24H  . insulin aspart  0-9  Units Subcutaneous Q4H  . piperacillin-tazobactam (ZOSYN)  IV  3.375 g Intravenous Q8H  . sodium chloride flush  10-40 mL Intracatheter Q12H  . tamsulosin  0.4 mg Oral QPC supper    Insulin Requirements in the past 24 hours:  3 units of sensitive SSI  Admit: 65 y/o male who presented to ED 11/13/2015 with fever and N/V admitted with sepsis. He has a recent discharge after repair of a bleeding ulcer and was on TPN that admission. Repeat CT with thickening of the gastrojejunostomy limb of his anastomosis as well as the duodenum, no leak, no increase in size of hiatal hernia, non obstructing distal left ureteral stone. Urology to be consulted. MD reports minimal nutrition over last couple weeks.  Assessment: 15.8 kg weight loss since 8/12. He reports minimal oral intake since discharge on 8/13 and not much appetite. At risk for refeeding syndrome.   Surgeries/Procedures: 8/3: Billroth II repair, partial gastrectomy for giant ulcer 06/2015: perforated  sigmoid diverticulitis s/p colostomy   GI: (+) nausea, spitting up, ostomy output less per CCS. Albumin low at 1.6. Repeat CT scan later this week. Pepcid IV, Ensure bid Endo: no hx DM, CBGs controlled (<150) Lytes: wnl. CoCa 9.3. Phos low at 2.0, Mg ok at 1.7. Renal: AKI resolved. SCr down to stable, CrCl ~40m/min. UOP looks to be good at 1.331mkg.hr. NS + 20K @ 120 ml/hr; ureteral stone should pass w/o intervention per Uro, tamsulosin added Pulm: RA Cards: BP soft, tachy Hepatobil: LFTs wnl, tbili wnl, alk phos wnl, Palb down to 4.4. TG wnl Neuro: intact. Dilaudid prn ID: Zosyn 8/17 >> for sepsis, ?enteritis, no sign intra-abd process, wound clean per CCS, Tmax of 102.8 yesterday, WBC 30.1.  Best Practices: enox 40  TPN Access: Double lumen PICC 8/19 TPN start date: 8/20 >>  Current Nutrition:  NPO Clinimix E 5/15 at 3053mr IV lipid emulsions at 72m49m  Nutritional Goals: per RD note 8/19 1800-2000 kCal,  85-100grams of protein  per day Clinimix E 5/15 at 80 ml/hr and 20% lipid emulsion at 10 ml/hr  Plan:  Increase Clinimix E 5/15 to 50ml85mContinue 20% lipid emulsion at 72ml/25mhis provides 60 g of protein and 1332 kCals per day meeting 65% of protein and 70% of kCal needs Decrease NS with 20mEq 8mCl to 100ml/hr102might Add MVI and TE in TPN Continue sensitive SSI and adjust as needed Monitor TPN labs, Bmet, Mg and Phos tomorrow F/U repeat CT scan  Give NaPhos 20mmol I71m1 today   Ellasyn Swilling BaElenor Quinones BCPS [facility] Pharmacist Pager [phone] 8:48 AM

## 2015-11-17 NOTE — Progress Notes (Signed)
Patient ID: Miguel [name], male   DOB: 10-May-1950, 65 y.o.   MRN: [id number]  [facility] Progress Note     Subjective: Feeling well this morning, no new complaints. Denies CP or SOB. Wife concerned that sometimes when Miguel [name] speaks he makes not sense, feels that it may be a combination of fever and pain medication.  Objective: Vital signs in last 24 hours: Temp:  [98.9 F (37.2 C)-102.8 F (39.3 C)] 99.2 F (37.3 C) (08/21 0403) Pulse Rate:  [110-118] 112 (08/21 0403) Resp:  [16-59] 17 (08/21 0403) BP: (112-142)/(65-83) 132/74 (08/21 0403) SpO2:  [93 %-98 %] 98 % (08/21 0403) Last BM Date: 11/16/15  Intake/Output from previous day: 08/20 0701 - 08/21 0700 In: 3541.2 [P.O.:50; I.V.:3106.7; IV Piggyback:384.5] Out: 1600 [Urine:1600] Intake/Output this shift: Total I/O In: -  Out: 100 [Urine:100]  PE: Gen:  Alert, pleasant, speaking illogically (per wife patient just received injection of pain medication) Card:  Tachycardic, regular rate Pulm:  CTAB Abd: Soft, NT/ND, +BS, midline abdominal incision covered in dry dressing  Lab Results:   Recent Labs  11/14/15 1853 11/15/15 0824  WBC 29.8* 30.1*  HGB 8.8* 8.8*  HCT 27.7* 28.6*  PLT 675* 655*   BMET  Recent Labs  11/16/15 0300 11/17/15 0602  NA 137 134*  K 4.6 3.9  CL 106 104  CO2 22 22  GLUCOSE 109* 117*  BUN 14 7  CREATININE 0.92 0.83  CALCIUM 7.7* 7.4*   PT/INR No results for input(s): LABPROT, INR in the last 72 hours. CMP     Component Value Date/Time   NA 134 (L) 11/17/2015 0602   K 3.9 11/17/2015 0602   CL 104 11/17/2015 0602   CO2 22 11/17/2015 0602   GLUCOSE 117 (H) 11/17/2015 0602   BUN 7 11/17/2015 0602   CREATININE 0.83 11/17/2015 0602   CALCIUM 7.4 (L) 11/17/2015 0602   PROT 4.9 (L) 11/17/2015 0602   ALBUMIN 1.6 (L) 11/17/2015 0602   AST 12 (L) 11/17/2015 0602   ALT 11 (L) 11/17/2015 0602   ALKPHOS 74 11/17/2015 0602   BILITOT 0.3 11/17/2015 0602   GFRNONAA  >60 11/17/2015 0602   GFRAA >60 11/17/2015 0602   Lipase     Component Value Date/Time   LIPASE 44 11/13/2015 2300       Studies/Results: No results found.  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 Consider repeating later this week Leukocytosis - down to 14.0 from 30.1  ID - Zosyn started 11/13/15 VTE - lovenox FEN - PICC/TNA/NPO Dispo - WBC trending down, still with fever today 101.3   LOS: 3 days    Jerrye Beavers , [facility] 11/17/2015, 8:40 AM Pager: [phone] Consults: [phone] Mon-Fri 7:00 am-4:30 pm Sat-Sun 7:00 am-11:30 am

## 2015-11-17 NOTE — Progress Notes (Signed)
Still no stone noted from strained urine as of this writing.

## 2015-11-17 NOTE — Progress Notes (Signed)
Pharmacy Antibiotic Note  Miguel [name] is a 65 y.o. male admitted on 11/13/2015 with fever.  Pharmacy has been consulted for Zosyn dosing. Pt recently had ulcer repair/gastrectomy on 8/3. WBC marked up over the last several days (10.7 on 8/13, 36.9 on 8/17). Pt also appears to be in acute renal failure.   Now Day #5 of Zosyn for sepsis, ?enteritis, wound clean per CCS. Still spiking fevers yesterday. WBC 30.1.  Plan: Continue Zosyn 3.375 gm IV q8h (4 hour infusion) Monitor clinical picture, renal function F/U abx deescalation / LOT  Height: 5\' 6"  (167.6 cm) Weight: 136 lb 9.6 oz (62 kg) IBW/kg (Calculated) : 63.8  Temp (24hrs), Avg:100.4 F (38 C), Min:98.9 F (37.2 C), Max:102.8 F (39.3 C)   Recent Labs Lab 11/13/15 2159 11/13/15 2203 11/14/15 0031 11/14/15 1853 11/15/15 0824 11/16/15 0300 11/17/15 0602  WBC  --  36.9*  --  29.8* 30.1*  --   --   CREATININE  --  1.92*  --  1.38* 1.18 0.92 0.83  LATICACIDVEN 2.71*  --  1.74  --   --   --   --     Estimated Creatinine Clearance: 77.8 mL/min (by C-G formula based on SCr of 0.83 mg/dL).    Allergies  Allergen Reactions  . No Known Allergies     Reginia Naas 11/17/2015 9:26 AM

## 2015-11-18 ENCOUNTER — Encounter (HOSPITAL_COMMUNITY): Payer: Self-pay | Admitting: General Surgery

## 2015-11-18 ENCOUNTER — Encounter (HOSPITAL_COMMUNITY): Payer: Medicare Other

## 2015-11-18 LAB — BASIC METABOLIC PANEL
ANION GAP: 8 (ref 5–15)
BUN: 7 mg/dL (ref 6–20)
CO2: 24 mmol/L (ref 22–32)
Calcium: 7.6 mg/dL — ABNORMAL LOW (ref 8.9–10.3)
Chloride: 102 mmol/L (ref 101–111)
Creatinine, Ser: 0.82 mg/dL (ref 0.61–1.24)
Glucose, Bld: 122 mg/dL — ABNORMAL HIGH (ref 65–99)
POTASSIUM: 4.5 mmol/L (ref 3.5–5.1)
SODIUM: 134 mmol/L — AB (ref 135–145)

## 2015-11-18 LAB — GLUCOSE, CAPILLARY
GLUCOSE-CAPILLARY: 161 mg/dL — AB (ref 65–99)
GLUCOSE-CAPILLARY: 168 mg/dL — AB (ref 65–99)
Glucose-Capillary: 129 mg/dL — ABNORMAL HIGH (ref 65–99)
Glucose-Capillary: 134 mg/dL — ABNORMAL HIGH (ref 65–99)
Glucose-Capillary: 135 mg/dL — ABNORMAL HIGH (ref 65–99)
Glucose-Capillary: 146 mg/dL — ABNORMAL HIGH (ref 65–99)
Glucose-Capillary: 158 mg/dL — ABNORMAL HIGH (ref 65–99)

## 2015-11-18 LAB — CULTURE, BLOOD (ROUTINE X 2)
CULTURE: NO GROWTH
CULTURE: NO GROWTH

## 2015-11-18 LAB — MAGNESIUM: MAGNESIUM: 1.8 mg/dL (ref 1.7–2.4)

## 2015-11-18 LAB — URINE MICROSCOPIC-ADD ON

## 2015-11-18 LAB — CBC
HCT: 24.9 % — ABNORMAL LOW (ref 39.0–52.0)
Hemoglobin: 7.7 g/dL — ABNORMAL LOW (ref 13.0–17.0)
MCH: 27.5 pg (ref 26.0–34.0)
MCHC: 30.9 g/dL (ref 30.0–36.0)
MCV: 88.9 fL (ref 78.0–100.0)
PLATELETS: 536 10*3/uL — AB (ref 150–400)
RBC: 2.8 MIL/uL — AB (ref 4.22–5.81)
RDW: 16.4 % — ABNORMAL HIGH (ref 11.5–15.5)
WBC: 19.8 10*3/uL — AB (ref 4.0–10.5)

## 2015-11-18 LAB — URINALYSIS, ROUTINE W REFLEX MICROSCOPIC
BILIRUBIN URINE: NEGATIVE
GLUCOSE, UA: 100 mg/dL — AB
Hgb urine dipstick: NEGATIVE
KETONES UR: NEGATIVE mg/dL
LEUKOCYTES UA: NEGATIVE
Nitrite: NEGATIVE
PH: 5 (ref 5.0–8.0)
PROTEIN: 30 mg/dL — AB
Specific Gravity, Urine: 1.023 (ref 1.005–1.030)

## 2015-11-18 LAB — PHOSPHORUS: PHOSPHORUS: 2 mg/dL — AB (ref 2.5–4.6)

## 2015-11-18 MED ORDER — FLUCONAZOLE IN SODIUM CHLORIDE 400-0.9 MG/200ML-% IV SOLN
400.0000 mg | INTRAVENOUS | Status: AC
  Administered 2015-11-18 – 2015-11-28 (×11): 400 mg via INTRAVENOUS
  Filled 2015-11-18 (×14): qty 200

## 2015-11-18 MED ORDER — TRACE MINERALS CR-CU-MN-SE-ZN 10-1000-500-60 MCG/ML IV SOLN
INTRAVENOUS | Status: AC
  Administered 2015-11-18: 17:00:00 via INTRAVENOUS
  Filled 2015-11-18: qty 1920

## 2015-11-18 MED ORDER — FAT EMULSION 20 % IV EMUL
240.0000 mL | INTRAVENOUS | Status: AC
  Administered 2015-11-18: 240 mL via INTRAVENOUS
  Filled 2015-11-18: qty 250

## 2015-11-18 MED ORDER — VANCOMYCIN HCL IN DEXTROSE 750-5 MG/150ML-% IV SOLN
750.0000 mg | Freq: Two times a day (BID) | INTRAVENOUS | Status: DC
  Administered 2015-11-19 – 2015-11-21 (×5): 750 mg via INTRAVENOUS
  Filled 2015-11-18 (×7): qty 150

## 2015-11-18 MED ORDER — DEXTROSE 5 % IV SOLN
30.0000 mmol | Freq: Once | INTRAVENOUS | Status: AC
  Administered 2015-11-18: 30 mmol via INTRAVENOUS
  Filled 2015-11-18: qty 10

## 2015-11-18 MED ORDER — VANCOMYCIN HCL 10 G IV SOLR
1250.0000 mg | Freq: Once | INTRAVENOUS | Status: AC
  Administered 2015-11-18: 1250 mg via INTRAVENOUS
  Filled 2015-11-18: qty 1250

## 2015-11-18 MED ORDER — POTASSIUM CHLORIDE IN NACL 20-0.9 MEQ/L-% IV SOLN
INTRAVENOUS | Status: DC
  Administered 2015-11-19 – 2015-11-20 (×2): via INTRAVENOUS
  Administered 2015-11-21: 50 mL/h via INTRAVENOUS
  Administered 2015-11-25 – 2015-12-02 (×4): via INTRAVENOUS
  Administered 2015-12-03: 1 mL via INTRAVENOUS
  Filled 2015-11-18 (×12): qty 1000

## 2015-11-18 NOTE — Progress Notes (Signed)
[facility] Progress Note     Subjective: Still with some nonsensical speech. Oriented. Reports fever and chills overnight. Denies abdominal pain. Complains of aching back pain. Denies nausea and vomiting. Ambulating. Per nurse, no ostomy output for 24h.   Objective: Vital signs in last 24 hours: Temp:  [100 F (37.8 C)-101.4 F (38.6 C)] 100.8 F (38.2 C) (08/22 0426) Pulse Rate:  [101-114] 114 (08/22 0426) Resp:  [18] 18 (08/22 0426) BP: (117-151)/(70-84) 151/84 (08/22 0426) SpO2:  [96 %-98 %] 96 % (08/22 0426) Last BM Date: 11/16/15  Intake/Output from previous day: 08/21 0701 - 08/22 0700 In: 1700 [I.V.:1600; IV Piggyback:100] Out: 1250 [Urine:1250] Intake/Output this shift: No intake/output data recorded.  PE: Gen:  Alert, NAD, pleasant Card:  RRR, no M/G/R  Pulm:  CTA, no W/R/R Abd: Soft, NT/ND, hypoactive BS, midline incision C/D/I - pink granulation tissue without slough/necrosis. GU: no CVA tenderness Ext: edema in BL lower extremities. No erythema.  Lab Results:   Recent Labs  11/17/15 0602 11/18/15 0445  WBC 14.0* 19.8*  HGB 7.1* 7.7*  HCT 23.1* 24.9*  PLT 504* 536*   BMET  Recent Labs  11/17/15 0602 11/18/15 0445  NA 134* 134*  K 3.9 4.5  CL 104 102  CO2 22 24  GLUCOSE 117* 122*  BUN 7 7  CREATININE 0.83 0.82  CALCIUM 7.4* 7.6*   PT/INR No results for input(s): LABPROT, INR in the last 72 hours. CMP     Component Value Date/Time   NA 134 (L) 11/18/2015 0445   K 4.5 11/18/2015 0445   CL 102 11/18/2015 0445   CO2 24 11/18/2015 0445   GLUCOSE 122 (H) 11/18/2015 0445   BUN 7 11/18/2015 0445   CREATININE 0.82 11/18/2015 0445   CALCIUM 7.6 (L) 11/18/2015 0445   PROT 4.9 (L) 11/17/2015 0602   ALBUMIN 1.6 (L) 11/17/2015 0602   AST 12 (L) 11/17/2015 0602   ALT 11 (L) 11/17/2015 0602   ALKPHOS 74 11/17/2015 0602   BILITOT 0.3 11/17/2015 0602   GFRNONAA >60 11/18/2015 0445   GFRAA >60 11/18/2015 0445   Lipase      Component Value Date/Time   LIPASE 44 11/13/2015 2300       Studies/Results: No results found.  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 Consider repeating late this week. - B/l LE Duplex one week ago was negative  - Urinalysis pending  Leukocytosis - back up to 19.8 this AM, 14 yesterday  ID - Zosyn 11/13/15 >> VTE - lovenox FEN - PICC/TNA/clears Dispo - fever, unknown source    LOS: 4 days    Jill Alexanders , [facility] 11/18/2015, 8:09 AM Pager: [phone] Consults: [phone] Mon-Fri 7:00 am-4:30 pm Sat-Sun 7:00 am-11:30 am

## 2015-11-18 NOTE — Addendum Note (Signed)
Addendum  created 11/18/15 1159 by Jillyn Hidden, MD   Anesthesia Event edited, Anesthesia Staff edited, Sign clinical note

## 2015-11-18 NOTE — Progress Notes (Addendum)
Nutrition Follow-up  DOCUMENTATION CODES:   Severe malnutrition in context of acute illness/injury  INTERVENTION:   -RD will follow for diet advancement and supplement as appropriate -TPN management per pharmacy  NUTRITION DIAGNOSIS:   Malnutrition related to acute illness as evidenced by percent weight loss, moderate depletions of muscle mass, moderate depletion of body fat.  Ongoing  GOAL:   Patient will meet greater than or equal to 90% of their needs  Progressing  MONITOR:   PO intake, Diet advancement, Labs, Weight trends, Skin, I & O's  REASON FOR ASSESSMENT:   Consult, Malnutrition Screening Tool New TPN/TNA  ASSESSMENT:   65 year old male. He is status post an antrectomy with B2 anastomosis on August 3 for bleeding ulcer. He has had a previous colostomy for perforated diverticulitis. He was discharged home approximate 4 days ago. He has had increasing fatigue, abdominal discomfort, nausea, and fever over the past 2 days.  Pt remains on antibiotics for possible enteritis in gastrojejunostomy limb.   Pt transferred from SDU to surgical floor on 11/16/15.   Pt sleeping soundly at time of visit. RD did not wake.   Pt advanced to clear liquid diet on 11/16/16. Per discussion with RN, pt tolerating well.   Per pharmacy note, pt currently receiving TPN- Clinimix E 5/15 at 59ml/hr and  20% lipid emulsion at 32ml/hr, which provides 1332 kcals (70% of estimated needs) and 60 grams of protein (65% of estimated needs). Plan to increase Clinimix E 5/15 to goal rate of 17ml/hr and  20% lipid emulsion at 16ml/hr at 1800. This will provide on 1843 kcals and 96 grams protein, which meets 100% of needs.   Labs reviewed: CBGS:134-158.   Diet Order:  TPN (CLINIMIX-E) Adult Diet clear liquid Room service appropriate? Yes; Fluid consistency: Thin TPN (CLINIMIX-E) Adult  Skin:  Reviewed, no issues  Last BM:  11/16/15  Height:   Ht Readings from Last 1 Encounters:  11/14/15 5'  6" (1.676 m)    Weight:   Wt Readings from Last 1 Encounters:  11/14/15 136 lb 9.6 oz (62 kg)    Ideal Body Weight:  64.5 kg  BMI:  Body mass index is 22.05 kg/m.  Estimated Nutritional Needs:   Kcal:  1800-2000  Protein:  85-100 grams  Fluid:  1.8 - 2. L/day  EDUCATION NEEDS:   No education needs identified at this time  Rhea Kaelin A. Jimmye Norman, RD, LDN, CDE Pager: [phone] After hours Pager: [phone]

## 2015-11-18 NOTE — Progress Notes (Signed)
Pharmacy Antibiotic Note  Miguel [name] is a 65 y.o. male admitted on 11/13/2015 with fever.  Pharmacy has been consulted for Zosyn dosing. Pt recently had ulcer repair/gastrectomy on 8/3. WBC marked up over the last several days (10.7 on 8/13, 36.9 on 8/17). Pt also appears to be in acute renal failure.   Now Day #6 of Zosyn for sepsis, ?enteritis, wound clean per CCS. PICC line is only 72 days old. Unsure of source. Still spiking fevers today. WBC down to 19.8. Pharmacy consulted to add vancomycin. SCr stable, CrCl ~32ml/min.  Plan: Continue Zosyn 3.375 gm IV q8h (4 hour infusion) Give vancomycin 1,250mg  IV x 1, then start vancomycin 750mg  IV Q12 Monitor clinical picture, renal function F/U abx deescalation / LOT  Height: 5\' 6"  (167.6 cm) Weight: 136 lb 9.6 oz (62 kg) IBW/kg (Calculated) : 63.8  Temp (24hrs), Avg:100.1 F (37.8 C), Min:98.4 F (36.9 C), Max:101.2 F (38.4 C)   Recent Labs Lab 11/13/15 2159  11/13/15 2203 11/14/15 0031 11/14/15 1853 11/15/15 0824 11/16/15 0300 11/17/15 0602 11/18/15 0445  WBC  --   --  36.9*  --  29.8* 30.1*  --  14.0* 19.8*  CREATININE  --   < > 1.92*  --  1.38* 1.18 0.92 0.83 0.82  LATICACIDVEN 2.71*  --   --  1.74  --   --   --   --   --   < > = values in this interval not displayed.  Estimated Creatinine Clearance: 78.8 mL/min (by C-G formula based on SCr of 0.82 mg/dL).    Allergies  Allergen Reactions  . No Known Allergies     Reginia Naas 11/18/2015 2:52 PM

## 2015-11-18 NOTE — Progress Notes (Signed)
PARENTERAL NUTRITION CONSULT NOTE - FOLLOW UP  Pharmacy Consult for TPN Indication: Intolerance to enteral feeding  Allergies  Allergen Reactions  . No Known Allergies     Patient Measurements: Height: _0  (167.6 cm) Weight: 136 lb 9.6 oz (62 kg) IBW/kg (Calculated) : 63.8 Weight last admit: 77.8 kg  Usual weight: ~79.5 kg  Vital Signs: Temp: 100.8 F (38.2 C) (08/22 0426) Temp Source: Oral (08/22 0426) BP: 151/84 (08/22 0426) Pulse Rate: 114 (08/22 0426) Intake/Output from previous day: 08/21 0701 - 08/22 0700 In: 1700 [I.V.:1600; IV Piggyback:100] Out: 1250 [Urine:1250] Intake/Output from this shift: No intake/output data recorded.   Insulin Requirements in the past 24 hours:  7 units of sensitive SSI  Admit: 65 y/o male who presented to ED 11/13/2015 with fever and N/V admitted with sepsis. He has a recent discharge after repair of a bleeding ulcer and was on TPN that admission. Repeat CT with thickening of the gastrojejunostomy limb of his anastomosis as well as the duodenum, no leak, no increase in size of hiatal hernia, non obstructing distal left ureteral stone. Urology to be consulted. MD reports minimal nutrition over last couple weeks.  Assessment: 15.8 kg weight loss since 8/12. He reports minimal oral intake since discharge on 8/13 and not much appetite. At risk for refeeding syndrome.   Surgeries/Procedures: 8/3: Billroth II repair, partial gastrectomy for giant ulcer 06/2015: perforated sigmoid diverticulitis s/p colostomy   GI: Denies any N/V. No ostomy output in last 24 hrs. Albumin low at 1.6. Prelabumin down to 4.4. Advanced to clears yesterday. Repeat CT scan later this week. Endo: no hx DM, CBGs controlled (110-160s) Lytes: wnl exc Phos remains borderline low at 2.0 even after replacement past 2 days. CoCa 9.5. Mg ok at 1.8 Renal: AKI resolved. SCr down to stable, CrCl ~69m/min. UOP looks to be good at 0.874mkg.hr. NS + 20K @ 100 ml/hr; ureteral  stone should pass w/o intervention per Uro, tamsulosin added Pulm: RA Cards: BP soft, tachy Hepatobil: LFTs wnl, tbili wnl, alk phos wnl, TG wnl Neuro: intact. Dilaudid prn ID: Zosyn 8/17 >> for sepsis, ?enteritis, no sign intra-abd process, wound clean per CCS. Unsure what possible site of infection is. Continues to have fevers but WBC trending down to 19.8.  Best Practices: enox 40  TPN Access: Double lumen PICC 8/19 TPN start date: 8/20 >>  Current Nutrition:  Clears Clinimix E 5/15 at 5073mr IV lipid emulsions at 18m37m  Nutritional Goals: per RD note 8/19 1800-2000 kCal,  85-100grams of protein per day Clinimix E 5/15 at 80 ml/hr and 20% lipid emulsion at 10 ml/hr  Plan:  Increase Clinimix E 5/15 to goal rate of 80ml46mContinue 20% lipid emulsion at 18ml/58mhis provides 96 g of protein and 1843 kCals per day meeting 100% of protein and kCal needs Decrease NS with 20mEq 7mCl to 70ml/hr26might Add MVI and TE in TPN Continue sensitive SSI and adjust as needed Monitor TPN labs, Bmet and Phos tomorrow F/U repeat CT scan  Give NaPhos 30mmol I44m1 today  Chinaza Rooke BaElenor Quinones BCPS [facility] Pharmacist Pager [phone] 9:30 AM

## 2015-11-19 DIAGNOSIS — R5082 Postprocedural fever: Secondary | ICD-10-CM | POA: Diagnosis present

## 2015-11-19 DIAGNOSIS — Z903 Acquired absence of stomach [part of]: Secondary | ICD-10-CM

## 2015-11-19 DIAGNOSIS — Z933 Colostomy status: Secondary | ICD-10-CM

## 2015-11-19 LAB — CBC
HEMATOCRIT: 23.8 % — AB (ref 39.0–52.0)
HEMOGLOBIN: 7.4 g/dL — AB (ref 13.0–17.0)
MCH: 27.6 pg (ref 26.0–34.0)
MCHC: 31.1 g/dL (ref 30.0–36.0)
MCV: 88.8 fL (ref 78.0–100.0)
Platelets: 535 10*3/uL — ABNORMAL HIGH (ref 150–400)
RBC: 2.68 MIL/uL — AB (ref 4.22–5.81)
RDW: 16.8 % — AB (ref 11.5–15.5)
WBC: 17.3 10*3/uL — AB (ref 4.0–10.5)

## 2015-11-19 LAB — GLUCOSE, CAPILLARY
GLUCOSE-CAPILLARY: 173 mg/dL — AB (ref 65–99)
GLUCOSE-CAPILLARY: 135 mg/dL — AB (ref 65–99)
GLUCOSE-CAPILLARY: 134 mg/dL — AB (ref 65–99)
Glucose-Capillary: 150 mg/dL — ABNORMAL HIGH (ref 65–99)
Glucose-Capillary: 139 mg/dL — ABNORMAL HIGH (ref 65–99)

## 2015-11-19 LAB — PHOSPHORUS: Phosphorus: 2.5 mg/dL (ref 2.5–4.6)

## 2015-11-19 LAB — BASIC METABOLIC PANEL
ANION GAP: 10 (ref 5–15)
BUN: 8 mg/dL (ref 6–20)
CALCIUM: 7.5 mg/dL — AB (ref 8.9–10.3)
CHLORIDE: 99 mmol/L — AB (ref 101–111)
CO2: 25 mmol/L (ref 22–32)
Creatinine, Ser: 0.78 mg/dL (ref 0.61–1.24)
GFR calc non Af Amer: >60 mL/min (ref >60)
GLUCOSE: 161 mg/dL — AB (ref 65–99)
POTASSIUM: 4 mmol/L (ref 3.5–5.1)
Sodium: 134 mmol/L — ABNORMAL LOW (ref 135–145)

## 2015-11-19 MED ORDER — FAT EMULSION 20 % IV EMUL
240.0000 mL | INTRAVENOUS | Status: AC
  Administered 2015-11-19: 240 mL via INTRAVENOUS
  Filled 2015-11-19: qty 250

## 2015-11-19 MED ORDER — TRACE MINERALS CR-CU-MN-SE-ZN 10-1000-500-60 MCG/ML IV SOLN
INTRAVENOUS | Status: AC
  Administered 2015-11-19: 18:00:00 via INTRAVENOUS
  Filled 2015-11-19: qty 1920

## 2015-11-19 NOTE — Progress Notes (Signed)
[facility] Progress Note     Subjective: Sitting up in chair, denies fevers/chills overnight. Slept poorly. Reports feeling fatigued. Denies HA, neck pain, CP, SOB, abdominal pain, nausea, and vomiting.   Objective: Vital signs in last 24 hours: Temp:  [98.4 F (36.9 C)-102.6 F (39.2 C)] 99.7 F (37.6 C) (08/23 0406) Pulse Rate:  [111-116] 116 (08/23 0406) Resp:  [17-18] 17 (08/23 0406) BP: (133-142)/(69-93) 140/69 (08/23 0406) SpO2:  [95 %-100 %] 95 % (08/23 0406) Last BM Date: 11/16/15  Intake/Output from previous day: 08/22 0701 - 08/23 0700 In: 2210 [P.O.:360; I.V.:1600; IV Piggyback:250] Out: 1275 [Urine:1275] Intake/Output this shift: No intake/output data recorded.  PE: Gen:  Alert, NAD, cooperative HEENT: negative Brudzinki's test Card:  Tachycardic, regular rhythm, no M/G/R  Pulm:  CTA, no W/R/R Abd: Soft, NT/ND, +BS, incision C/D/I  Ext:  Bilateral lower extremity edema   Lab Results:   Recent Labs  11/18/15 0445 11/19/15 0455  WBC 19.8* 17.3*  HGB 7.7* 7.4*  HCT 24.9* 23.8*  PLT 536* 535*   BMET  Recent Labs  11/18/15 0445 11/19/15 0455  NA 134* 134*  K 4.5 4.0  CL 102 99*  CO2 24 25  GLUCOSE 122* 161*  BUN 7 8  CREATININE 0.82 0.78  CALCIUM 7.6* 7.5*   PT/INR No results for input(s): LABPROT, INR in the last 72 hours. CMP     Component Value Date/Time   NA 134 (L) 11/19/2015 0455   K 4.0 11/19/2015 0455   CL 99 (L) 11/19/2015 0455   CO2 25 11/19/2015 0455   GLUCOSE 161 (H) 11/19/2015 0455   BUN 8 11/19/2015 0455   CREATININE 0.78 11/19/2015 0455   CALCIUM 7.5 (L) 11/19/2015 0455   PROT 4.9 (L) 11/17/2015 0602   ALBUMIN 1.6 (L) 11/17/2015 0602   AST 12 (L) 11/17/2015 0602   ALT 11 (L) 11/17/2015 0602   ALKPHOS 74 11/17/2015 0602   BILITOT 0.3 11/17/2015 0602   GFRNONAA >60 11/19/2015 0455   GFRAA >60 11/19/2015 0455   Lipase     Component Value Date/Time   LIPASE 44 11/13/2015 2300   Studies/Results: No  results found.  Anti-infectives: Anti-infectives    Start     Dose/Rate Route Frequency Ordered Stop   11/19/15 0400  vancomycin (VANCOCIN) IVPB 750 mg/150 ml premix     750 mg 150 mL/hr over 60 Minutes Intravenous Every 12 hours 11/18/15 1459     11/18/15 1600  fluconazole (DIFLUCAN) IVPB 400 mg     400 mg 100 mL/hr over 120 Minutes Intravenous Every 24 hours 11/18/15 1452     11/18/15 1530  vancomycin (VANCOCIN) 1,250 mg in sodium chloride 0.9 % 250 mL IVPB     1,250 mg 166.7 mL/hr over 90 Minutes Intravenous  Once 11/18/15 1452 11/18/15 1940   11/14/15 0600  piperacillin-tazobactam (ZOSYN) IVPB 3.375 g     3.375 g 12.5 mL/hr over 240 Minutes Intravenous Every 8 hours 11/13/15 2328     11/13/15 2300  piperacillin-tazobactam (ZOSYN) IVPB 3.375 g     3.375 g 100 mL/hr over 30 Minutes Intravenous  Once 11/13/15 2257 11/14/15 0217     Assessment/Plan 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 Large hiatal hernia containing the gastrojejunostomy limb Possible enteritis in Gastro-J limb- continue antibiotics and daily dressing changes  Fever - 101.8 @ 1653 yesterday  Leukocytosis - 17.3 from 19.8 yesterday - CT Chest/Abd 11/14/15 negative for intraabdominal abscess/fluid collection, negative for developing pneumonia -  B/l LE Duplex one week ago was negative  - Urinalysis 11/18/15 negative - repeat peripheral and central (R PICC) blood cultures repeated 11/08/15 - pending   ID - Zosyn 11/13/15 >>, vancomycin and fluconazole 8/22 >> VTE - lovenox FEN - PICC/TNA/clears Dispo - fever, unknown source; Consult ID    LOS: 5 days    Jill Alexanders , [facility] Surgery 11/19/2015, 7:56 AM Pager: [phone] Consults: [phone] Mon-Fri 7:00 am-4:30 pm Sat-Sun 7:00 am-11:30 am

## 2015-11-19 NOTE — Consult Note (Signed)
[place] for Infectious Disease    Date of Admission:  11/13/2015   Total days of antibiotics 25        Day 7 piperacillin tazobactam        Day 2 vancomycin        Day 2 fluconazole       Reason for Consult: Persistent, unexplained postoperative fevers    Referring Physician: Dr. Volney American Primary Care Physician: Dr. Donnie Coffin  Principal Problem:   Postoperative fever Active Problems:   S/P partial gastrectomy   Hyperglycemia   Hiccups   AKI (acute kidney injury) ([place])   Severe protein-calorie malnutrition ([place])   . enoxaparin (LOVENOX) injection  40 mg Subcutaneous Q24H  . fluconazole (DIFLUCAN) IV  400 mg Intravenous Q24H  . insulin aspart  0-9 Units Subcutaneous Q4H  . piperacillin-tazobactam (ZOSYN)  IV  3.375 g Intravenous Q8H  . sodium chloride flush  10-40 mL Intracatheter Q12H  . tamsulosin  0.4 mg Oral QPC supper  . vancomycin  750 mg Intravenous Q12H    Recommendations: 1. Continue current antibiotics   Assessment: The exact source of his fever remains unclear but I suspect it is related in someway to his recent bleeding gastric ulcer and surgery. All blood cultures have been negative. He has no evidence of a urinary tract infection, pneumonia, antibiotic associated diarrhea or drug fever. His antibiotic therapy was just broadened yesterday. I will continue his current regimen and follow-up tomorrow.    HPI: Miguel [name] is a 65 y.o. male who was admitted to the hospital on 10/27/2015 with an acute GI bleed. He was found to have a large gastric ulcer and underwent partial gastrectomy and gastrojejunostomy on 10/30/2015. He began to have fevers on 11/04/2015. Blood cultures at that time were negative. Abdominal wound cultures grew enterococcus and group F strep. He was treated with piperacillin tazobactam and vancomycin and seem to have some improvement. He was discharged home on 11/09/2015 on oral clindamycin but had renewed fevers  leading to readmission on 11/14/2015. CT scan shows thickening around the gastrojejunostomy. There is no obvious anastomotic leak. No abscess was seen. He has stable moderate ascites. Repeat blood cultures were negative. His urinalysis was normal. Has no clear evidence of pneumonia by chest x-ray or CT. He has had persistent fever with some intermittent confusion. He also has a very large hiatal hernia and he had previous sigmoid colectomy and Hartman's pouch colostomy last April when he was admitted with sigmoid diverticulitis complicated by perforation.   Review of Systems: Review of Systems  Unable to perform ROS: Mental acuity    Past Medical History:  Diagnosis Date  . Anemia   . Anxiety   . Gastritis   . GI bleed due to NSAIDs 10/27/2015  . Hyperlipidemia   . Hypertension   . Kidney stones   . 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 Sigmoid diverticulitis    with perforation    Social History  Substance Use Topics  . Smoking status: Never Smoker  . Smokeless tobacco: Former Systems developer    Types: Snuff    Quit date: 07/14/2015  . Alcohol use No    Family History  Problem Relation Age of Onset  . Stroke Mother   . Stroke Brother   . Heart disease Brother    Allergies  Allergen Reactions  . No Known Allergies     OBJECTIVE: Blood pressure 131/75,  pulse (!) 110, temperature (!) 100.4 F (38 C), temperature source Oral, resp. rate 18, height 5\' 6"  (1.676 m), weight 136 lb 9.6 oz (62 kg), SpO2 100 %.  Physical Exam  Constitutional: No distress.  He is sitting up visiting with his wife and sister. He is slightly confused.  HENT:  Mouth/Throat: No oropharyngeal exudate.  Eyes: Conjunctivae are normal.  Neck: Neck supple.  Cardiovascular: Normal rate and regular rhythm.   No murmur heard. Pulmonary/Chest: Effort normal and breath sounds normal. He has no wheezes. He has no rales.  His breath sounds are slightly diminished in the lung bases posteriorly.  Abdominal: Soft.  He exhibits distension. He exhibits no mass. There is tenderness. There is no rebound and no guarding.  He has mild right upper quadrant tenderness. His open midline incision is pink and granulating without drainage or odor. He has a healed lower midline incision. He has a left-sided colostomy. He has had very little output for the past 3 days. Stoma looks good.  Musculoskeletal: Normal range of motion. He exhibits edema. He exhibits no tenderness.  He has 2+ pitting edema of his lower legs.  Neurological: He is alert.  Skin: No rash noted.  IV site looks good.    Lab Results Lab Results  Component Value Date   WBC 17.3 (H) 11/19/2015   HGB 7.4 (L) 11/19/2015   HCT 23.8 (L) 11/19/2015   MCV 88.8 11/19/2015   PLT 535 (H) 11/19/2015    Lab Results  Component Value Date   CREATININE 0.78 11/19/2015   BUN 8 11/19/2015   NA 134 (L) 11/19/2015   K 4.0 11/19/2015   CL 99 (L) 11/19/2015   CO2 25 11/19/2015    Lab Results  Component Value Date   ALT 11 (L) 11/17/2015   AST 12 (L) 11/17/2015   ALKPHOS 74 11/17/2015   BILITOT 0.3 11/17/2015     Microbiology: Recent Results (from the past 240 hour(s))  Culture, blood (Routine x 2)     Status: None   Collection Time: 11/13/15  9:28 PM  Result Value Ref Range Status   Specimen Description BLOOD LEFT ARM  Final   Special Requests BOTTLES DRAWN AEROBIC AND ANAEROBIC 5CC  Final   Culture NO GROWTH 5 DAYS  Final   Report Status 11/18/2015 FINAL  Final  Culture, blood (Routine x 2)     Status: None   Collection Time: 11/13/15  9:35 PM  Result Value Ref Range Status   Specimen Description BLOOD RIGHT ARM  Final   Special Requests IN PEDIATRIC BOTTLE 4CC  Final   Culture NO GROWTH 5 DAYS  Final   Report Status 11/18/2015 FINAL  Final  Urine culture     Status: None   Collection Time: 11/13/15 10:04 PM  Result Value Ref Range Status   Specimen Description URINE, RANDOM  Final   Special Requests NONE  Final   Culture NO GROWTH  Final    Report Status 11/15/2015 FINAL  Final  MRSA PCR Screening     Status: None   Collection Time: 11/14/15  5:41 PM  Result Value Ref Range Status   MRSA by PCR NEGATIVE NEGATIVE Final    Comment:        The GeneXpert MRSA Assay (FDA approved for NASAL specimens only), is one component of a comprehensive MRSA colonization surveillance program. It is not intended to diagnose MRSA infection nor to guide or monitor treatment for MRSA infections.   Culture, blood (routine  x 2)     Status: None (Preliminary result)   Collection Time: 11/18/15  3:10 PM  Result Value Ref Range Status   Specimen Description BLOOD LEFT ANTECUBITAL  Final   Special Requests BOTTLES DRAWN AEROBIC AND ANAEROBIC 10CC  Final   Culture NO GROWTH < 24 HOURS  Final   Report Status PENDING  Incomplete  Culture, blood (Routine X 2) w Reflex to ID Panel     Status: None (Preliminary result)   Collection Time: 11/18/15  5:42 PM  Result Value Ref Range Status   Specimen Description BLOOD PICC LINE  Final   Special Requests BOTTLES DRAWN AEROBIC AND ANAEROBIC 5CC  Final   Culture NO GROWTH < 24 HOURS  Final   Report Status PENDING  Incomplete    Michel Bickers, MD [place] for Infectious Disease [facility] [phone] pager   [phone] cell 11/19/2015, 6:20 PM

## 2015-11-19 NOTE — Progress Notes (Signed)
PARENTERAL NUTRITION CONSULT NOTE - FOLLOW UP  Pharmacy Consult for TPN Indication: Intolerance to enteral feeding  Allergies  Allergen Reactions  . No Known Allergies     Patient Measurements: Height: '5\' 6"'  (167.6 cm) Weight: 136 lb 9.6 oz (62 kg) IBW/kg (Calculated) : 63.8 Weight last admit: 77.8 kg  Usual weight: ~79.5 kg  Vital Signs: Temp: 99.7 F (37.6 C) (08/23 0406) Temp Source: Oral (08/23 0406) BP: 140/69 (08/23 0406) Pulse Rate: 116 (08/23 0406) Intake/Output from previous day: 08/22 0701 - 08/23 0700 In: 2210 [P.O.:360; I.V.:1600; IV Piggyback:250] Out: 1275 [Urine:1275] Intake/Output from this shift: No intake/output data recorded.   Insulin Requirements in the past 24 hours:  9 units of sensitive SSI  Admit: 65 y/o male who presented to ED 11/13/2015 with fever and N/V admitted with sepsis. He has a recent discharge after repair of a bleeding ulcer and was on TPN that admission. Repeat CT with thickening of the gastrojejunostomy limb of his anastomosis as well as the duodenum, no leak, no increase in size of hiatal hernia, non obstructing distal left ureteral stone. Urology to be consulted. MD reports minimal nutrition over last couple weeks.  Assessment: 15.8 kg weight loss since 8/12. He reports minimal oral intake since discharge on 8/13 and not much appetite. At risk for refeeding syndrome.   Surgeries/Procedures: 8/3: Billroth II repair, partial gastrectomy for giant ulcer 06/2015: perforated sigmoid diverticulitis s/p colostomy   GI: Denies any N/V. Albumin low at 1.6. Prelabumin down to 4.4. Advanced to clears, RN reports patient is tolerating, but documentation of diet intake/tolerance has been poor. Repeat CT scan later this week. Endo: no hx DM, CBGs < 180 Lytes: Na 134, Phos 2.5 s/p replacement over 2 days, CoCa 9.4, Mg 1.8 Renal: AKI resolved. SCr down, CrCl ~9m/min. UOP looks to be good at 0.858mkg.hr. NS + 20K @ 70 ml/hr; ureteral stone  should pass w/o intervention per Uro, tamsulosin added Pulm: RA Cards: BP soft, tachy Hepatobil: LFTs wnl, tbili wnl, alk phos wnl, TG wnl Neuro: intact. Dilaudid prn ID: Zosyn + vancomycin for fever of unknown origin, ?enteritis, no sign intra-abd process. Continues to have fevers but WBC trending down 30 > 17.3. Best Practices: enox 40  TPN Access: Double lumen PICC 8/19 TPN start date: 8/20 >>  Current Nutrition:  CLD Clinimix E 5/15 at 5063mr IV lipid emulsions at 93m68m  Nutritional Goals: per RD note 8/22 1800-2000 kCal,  85-100grams of protein per day Clinimix E 5/15 at 80 ml/hr and 20% lipid emulsion at 10 ml/hr  Plan:   Increase Clinimix E 5/15 to goal rate of 80ml48mContinue 20% lipid emulsion at 93ml/16mhis provides 96 g of protein and 1843 kCals per day meeting 100% of protein and kCal needs Reduce NS with 20K to 50 ml/hr - monitor K+ Add MVI and TE in TPN Continue sensitive SSI and adjust as needed Monitor TPN labs F/u diet tolerance and ability to wean TPN    AlisonHughes BettermD, BCPS Clinical Pharmacist 11/19/2015 8:53 AM

## 2015-11-20 ENCOUNTER — Inpatient Hospital Stay (HOSPITAL_COMMUNITY): Payer: 59

## 2015-11-20 DIAGNOSIS — K440 Diaphragmatic hernia with obstruction, without gangrene: Principal | ICD-10-CM

## 2015-11-20 LAB — COMPREHENSIVE METABOLIC PANEL
ALBUMIN: 1.5 g/dL — AB (ref 3.5–5.0)
ALT: 11 U/L — ABNORMAL LOW (ref 17–63)
ANION GAP: 7 (ref 5–15)
AST: 18 U/L (ref 15–41)
Alkaline Phosphatase: 113 U/L (ref 38–126)
BILIRUBIN TOTAL: 0.5 mg/dL (ref 0.3–1.2)
BUN: 10 mg/dL (ref 6–20)
CHLORIDE: 98 mmol/L — AB (ref 101–111)
CO2: 26 mmol/L (ref 22–32)
Calcium: 7.3 mg/dL — ABNORMAL LOW (ref 8.9–10.3)
Creatinine, Ser: 0.75 mg/dL (ref 0.61–1.24)
GFR calc Af Amer: >60 mL/min (ref >60)
GLUCOSE: 122 mg/dL — AB (ref 65–99)
POTASSIUM: 3.9 mmol/L (ref 3.5–5.1)
Sodium: 131 mmol/L — ABNORMAL LOW (ref 135–145)
TOTAL PROTEIN: 5 g/dL — AB (ref 6.5–8.1)

## 2015-11-20 LAB — BLOOD GAS, ARTERIAL
Acid-Base Excess: 2.2 mmol/L — ABNORMAL HIGH (ref 0.0–2.0)
BICARBONATE: 25.8 meq/L — AB (ref 20.0–24.0)
DRAWN BY: 398981
FIO2: 30
O2 Content: 3 L/min
O2 SAT: 96 %
PATIENT TEMPERATURE: 98.6
TCO2: 26.9 mmol/L (ref 0–100)
pCO2 arterial: 36.5 mmHg (ref 35.0–45.0)
pH, Arterial: 7.463 — ABNORMAL HIGH (ref 7.350–7.450)
pO2, Arterial: 78 mmHg — ABNORMAL LOW (ref 80.0–100.0)

## 2015-11-20 LAB — CBC
HCT: 21.9 % — ABNORMAL LOW (ref 39.0–52.0)
HEMOGLOBIN: 6.9 g/dL — AB (ref 13.0–17.0)
MCH: 27.7 pg (ref 26.0–34.0)
MCHC: 31.5 g/dL (ref 30.0–36.0)
MCV: 88 fL (ref 78.0–100.0)
PLATELETS: 497 10*3/uL — AB (ref 150–400)
RBC: 2.49 MIL/uL — AB (ref 4.22–5.81)
RDW: 17 % — ABNORMAL HIGH (ref 11.5–15.5)
WBC: 15.3 10*3/uL — AB (ref 4.0–10.5)

## 2015-11-20 LAB — GLUCOSE, CAPILLARY
GLUCOSE-CAPILLARY: 127 mg/dL — AB (ref 65–99)
GLUCOSE-CAPILLARY: 143 mg/dL — AB (ref 65–99)
GLUCOSE-CAPILLARY: 149 mg/dL — AB (ref 65–99)
GLUCOSE-CAPILLARY: 156 mg/dL — AB (ref 65–99)
GLUCOSE-CAPILLARY: 173 mg/dL — AB (ref 65–99)
Glucose-Capillary: 125 mg/dL — ABNORMAL HIGH (ref 65–99)
Glucose-Capillary: 131 mg/dL — ABNORMAL HIGH (ref 65–99)

## 2015-11-20 LAB — PREPARE RBC (CROSSMATCH)

## 2015-11-20 LAB — MAGNESIUM: MAGNESIUM: 1.7 mg/dL (ref 1.7–2.4)

## 2015-11-20 LAB — PHOSPHORUS: Phosphorus: 2.5 mg/dL (ref 2.5–4.6)

## 2015-11-20 MED ORDER — IOPAMIDOL (ISOVUE-300) INJECTION 61%
INTRAVENOUS | Status: AC
  Filled 2015-11-20: qty 150

## 2015-11-20 MED ORDER — LORAZEPAM 2 MG/ML IJ SOLN
0.5000 mg | Freq: Three times a day (TID) | INTRAMUSCULAR | Status: DC | PRN
  Administered 2015-11-20: 1 mg via INTRAVENOUS
  Filled 2015-11-20: qty 1

## 2015-11-20 MED ORDER — FUROSEMIDE 10 MG/ML IJ SOLN
20.0000 mg | Freq: Once | INTRAMUSCULAR | Status: AC
  Administered 2015-11-20: 20 mg via INTRAVENOUS
  Filled 2015-11-20: qty 2

## 2015-11-20 MED ORDER — SODIUM CHLORIDE 0.9 % IV SOLN
Freq: Once | INTRAVENOUS | Status: AC
  Administered 2015-11-20: 16:00:00 via INTRAVENOUS

## 2015-11-20 MED ORDER — ALBUTEROL SULFATE (2.5 MG/3ML) 0.083% IN NEBU
2.5000 mg | INHALATION_SOLUTION | Freq: Once | RESPIRATORY_TRACT | Status: AC
  Administered 2015-11-20: 2.5 mg via RESPIRATORY_TRACT
  Filled 2015-11-20: qty 3

## 2015-11-20 MED ORDER — FAT EMULSION 20 % IV EMUL
240.0000 mL | INTRAVENOUS | Status: AC
  Administered 2015-11-20: 240 mL via INTRAVENOUS
  Filled 2015-11-20: qty 250

## 2015-11-20 MED ORDER — TRACE MINERALS CR-CU-MN-SE-ZN 10-1000-500-60 MCG/ML IV SOLN
INTRAVENOUS | Status: AC
  Administered 2015-11-20: 17:00:00 via INTRAVENOUS
  Filled 2015-11-20: qty 1920

## 2015-11-20 MED ORDER — MAGNESIUM SULFATE 2 GM/50ML IV SOLN
2.0000 g | Freq: Once | INTRAVENOUS | Status: AC
  Administered 2015-11-20: 2 g via INTRAVENOUS
  Filled 2015-11-20: qty 50

## 2015-11-20 NOTE — Progress Notes (Signed)
Came to draw T&S from PICC however pt is out of the room having a procedure done.  RN to notify when returns.

## 2015-11-20 NOTE — Anesthesia Preprocedure Evaluation (Signed)
Anesthesia Evaluation  Patient identified by MRN, date of birth, ID band Patient awake    Reviewed: Allergy & Precautions, NPO status   Airway Mallampati: II  TM Distance: >3 FB Neck ROM: Full    Dental  (+) Edentulous Upper   Pulmonary    breath sounds clear to auscultation       Cardiovascular hypertension,  Rhythm:Regular Rate:Normal     Neuro/Psych    GI/Hepatic   Endo/Other    Renal/GU Renal disease     Musculoskeletal   Abdominal   Peds  Hematology  (+) anemia ,   Anesthesia Other Findings   Reproductive/Obstetrics                             Anesthesia Physical  Anesthesia Plan  ASA: II  Anesthesia Plan: General   Post-op Pain Management:    Induction: Intravenous  Airway Management Planned: Oral ETT  Additional Equipment:   Intra-op Plan:   Post-operative Plan: Extubation in OR  Informed Consent: I have reviewed the patients History and Physical, chart, labs and discussed the procedure including the risks, benefits and alternatives for the proposed anesthesia with the patient or authorized representative who has indicated his/her understanding and acceptance.   Dental advisory given  Plan Discussed with:   Anesthesia Plan Comments:        Anesthesia Quick Evaluation

## 2015-11-20 NOTE — Progress Notes (Signed)
CRITICAL VALUE ALERT  Critical value received:   Hemoglobin--6.9  Date of notification:  11/19/2013  Time of notification:  0625  Critical value read back:Yes.    Nurse who received alert: Thad Ranger  MD notified (1st page):  Dr. Rolm Bookbinder  Time of first page:  [phone]  MD notified (2nd page):  Time of second page:  Responding MD:  Dr. Rolm Bookbinder  Time MD responded:  0630

## 2015-11-20 NOTE — Progress Notes (Signed)
PARENTERAL NUTRITION CONSULT NOTE - FOLLOW UP  Pharmacy Consult for TPN Indication: Intolerance to enteral feeding  Allergies  Allergen Reactions  . No Known Allergies     Patient Measurements: Height: '5\' 6"'  (167.6 cm) Weight: 136 lb 9.6 oz (62 kg) IBW/kg (Calculated) : 63.8  Vital Signs: Temp: 98.2 F (36.8 C) (08/24 0538) Temp Source: Oral (08/24 0538) BP: 136/71 (08/24 0538) Pulse Rate: 118 (08/24 0538) Intake/Output from previous day: 08/23 0701 - 08/24 0700 In: 2816.8 [I.V.:2566.8; IV Piggyback:250] Out: 4680 [Urine:1255] Intake/Output from this shift: Total I/O In: 392.2 [I.V.:392.2] Out: -   Labs:  Recent Labs  11/18/15 0445 11/19/15 0455 11/20/15 0510  WBC 19.8* 17.3* 15.3*  HGB 7.7* 7.4* 6.9*  HCT 24.9* 23.8* 21.9*  PLT 536* 535* 497*     Recent Labs  11/18/15 0445 11/19/15 0455 11/20/15 0510  NA 134* 134* 131*  K 4.5 4.0 3.9  CL 102 99* 98*  CO2 '24 25 26  ' GLUCOSE 122* 161* 122*  BUN '7 8 10  ' CREATININE 0.82 0.78 0.75  CALCIUM 7.6* 7.5* 7.3*  MG 1.8  --  1.7  PHOS 2.0* 2.5 2.5  PROT  --   --  5.0*  ALBUMIN  --   --  1.5*  AST  --   --  18  ALT  --   --  11*  ALKPHOS  --   --  113  BILITOT  --   --  0.5   Estimated Creatinine Clearance: 80.7 mL/min (by C-G formula based on SCr of 0.8 mg/dL).    Recent Labs  11/20/15 0021 11/20/15 0531 11/20/15 0837  GLUCAP 125* 131* 127*    Medications:  Scheduled:  . sodium chloride   Intravenous Once  . enoxaparin (LOVENOX) injection  40 mg Subcutaneous Q24H  . fluconazole (DIFLUCAN) IV  400 mg Intravenous Q24H  . insulin aspart  0-9 Units Subcutaneous Q4H  . iopamidol      . piperacillin-tazobactam (ZOSYN)  IV  3.375 g Intravenous Q8H  . sodium chloride flush  10-40 mL Intracatheter Q12H  . tamsulosin  0.4 mg Oral QPC supper  . vancomycin  750 mg Intravenous Q12H    Insulin Requirements in the past 24 hours:  6 units of sensitive SSI  Admit: 65 y/o male who presented to ED  11/13/2015 with fever and N/V admitted with sepsis. He has a recent discharge after repair of a bleeding ulcer and was on TPN that admission. Repeat CT with thickening of the gastrojejunostomy limb of his anastomosis as well as the duodenum, no leak, no increase in size of hiatal hernia, non obstructing distal left ureteral stone. Urology to be consulted. MD reports minimal nutrition over last couple weeks.  Assessment: 15.8 kg weight loss since 8/12. He reports minimal oral intake since discharge on 8/13 and not much appetite. At risk for refeeding syndrome.   Surgeries/Procedures: 8/3: Billroth II repair, partial gastrectomy for giant ulcer 06/2015: perforated sigmoid diverticulitis s/p colostomy   GI:  Albumin low at 1.6. Prelabumin down to 4.4. Advanced to clears, RN reports patient is tolerating, but documentation of intake/tolerance has been poor. No pain, nausea.  Repeat CT scan later this week. Endo: no hx DM, CBGs 125-139, controlled. Lytes: Na 131, Phos 2.5, CoCa 9.4, Mg 1.7, K 3.9 Renal:  AKI resolved. Cr < 1, CrCl ~58m/min. UOP good at 0.826mkg.hr. NS + 20K @ 50 ml/hr; ureteral stone should pass w/o intervention per Uro, tamsulosin added Pulm: RA Cards: BP  stable, tachy Hepatobil:  LFTs wnl, tbili wnl, alk phos wnl, TG wnl Neuro: intact. Dilaudid prn ID: Zosyn + vancomycin for FUO, ?enteritis, no sign intra-abd process. Blood cx repeat 8/22- ntd.  Tm 102.4.  Fevers perhaps related to bleeding ulcer/surgery per ID, WBC trending down 30 > 15.3 since 8/19. Best Practices: enox 40  TPN Access: Double lumen PICC 8/19 TPN start date: 8/20 >>  Current Nutrition:  CLD Clinimix E 5/15 at 84m/hr IV lipid emulsions at 122mhr NS + 20K at 5020mr  Nutritional Goals: per RD note 8/22 1800-2000 kCal, 85-100grams of protein per day Clinimix E 5/15 at 80 ml/hr and 20% lipid emulsion at 10 ml/hr  Plan:   Increase Clinimix E 5/15 to goal rate of 63m34m Continue 20% lipid  emulsion at 10ml65mThis provides 96 g of protein and 1843 kCals per day meeting 100% of protein and kCal needs Add MVI and TE in TPN Added Pepcid 40mg 62m to TPN (on 20mg I32m2 before TPN) Mg 2g IV x 1 Cont NS with 20K to 50 ml/hr - monitor K+ Continue sensitive SSI and adjust as needed BMet, Mg in AM TPN labs qMon/Thur F/u diet tolerance and ability to wean TPN   Burgundy Matuszak Gracy BruinsD [facility]

## 2015-11-20 NOTE — Progress Notes (Signed)
Patient ID: Miguel [name], male   DOB: 1950/05/17, 65 y.o.   MRN: [id number]          [facility] for Infectious Disease    Date of Admission:  11/13/2015   Total days of antibiotics 26        Day 7 piperacillin tazobactam        Day 3 vancomycin        Day 3 fluconazole  Principal Problem:   Postoperative fever Active Problems:   S/P partial gastrectomy   Hyperglycemia   Hiccups   AKI (acute kidney injury) ([place])   Severe protein-calorie malnutrition ([place])   . enoxaparin (LOVENOX) injection  40 mg Subcutaneous Q24H  . fluconazole (DIFLUCAN) IV  400 mg Intravenous Q24H  . insulin aspart  0-9 Units Subcutaneous Q4H  . iopamidol      . piperacillin-tazobactam (ZOSYN)  IV  3.375 g Intravenous Q8H  . sodium chloride flush  10-40 mL Intracatheter Q12H  . tamsulosin  0.4 mg Oral QPC supper  . vancomycin  750 mg Intravenous Q12H    SUBJECTIVE: He says that he is feeling worse. He is spitting up more mucus that "tastes like bile".  Review of Systems: Review of Systems  Constitutional: Positive for chills and fever. Negative for diaphoresis.  Respiratory: Positive for cough.   Gastrointestinal: Negative for abdominal pain, nausea and vomiting.  Genitourinary: Negative for dysuria.    Past Medical History:  Diagnosis Date  . Anemia   . Anxiety   . Gastritis   . GI bleed due to NSAIDs 10/27/2015  . Hyperlipidemia   . Hypertension   . Kidney stones   . 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 Sigmoid diverticulitis    with perforation    Social History  Substance Use Topics  . Smoking status: Never Smoker  . Smokeless tobacco: Former Systems developer    Types: Snuff    Quit date: 07/14/2015  . Alcohol use No    Family History  Problem Relation Age of Onset  . Stroke Mother   . Stroke Brother   . Heart disease Brother    Allergies  Allergen Reactions  . No Known Allergies     OBJECTIVE: Vitals:   11/20/15 0538 11/20/15 1418 11/20/15 1530 11/20/15 1545  BP:  136/71 (!) 144/89 131/79 (!) 144/77  Pulse: (!) 118 (!) 104 (!) 102 (!) 101  Resp: 18 20 18 18   Temp: 98.2 F (36.8 C) 98.4 F (36.9 C) 98.6 F (37 C) 98.8 F (37.1 C)  TempSrc: Oral Oral Oral Oral  SpO2: 92% 93% 90% 91%  Weight:      Height:       Body mass index is 22.05 kg/m.  Physical Exam  Constitutional:  He is resting quietly in bed. He is alert. He is spitting up frothy sputum. He is not looking forward to having the NG tube placed.  Cardiovascular: Normal rate and regular rhythm.   No murmur heard. Pulmonary/Chest: Effort normal and breath sounds normal.  Abdominal:  No colostomy output. Prolapsed stoma is pink. Wound is reportedly red and granulating without evidence of infection.    Lab Results Lab Results  Component Value Date   WBC 15.3 (H) 11/20/2015   HGB 6.9 (LL) 11/20/2015   HCT 21.9 (L) 11/20/2015   MCV 88.0 11/20/2015   PLT 497 (H) 11/20/2015    Lab Results  Component Value Date   CREATININE 0.75 11/20/2015   BUN 10 11/20/2015  NA 131 (L) 11/20/2015   K 3.9 11/20/2015   CL 98 (L) 11/20/2015   CO2 26 11/20/2015    Lab Results  Component Value Date   ALT 11 (L) 11/20/2015   AST 18 11/20/2015   ALKPHOS 113 11/20/2015   BILITOT 0.5 11/20/2015     Microbiology: Recent Results (from the past 240 hour(s))  Culture, blood (Routine x 2)     Status: None   Collection Time: 11/13/15  9:28 PM  Result Value Ref Range Status   Specimen Description BLOOD LEFT ARM  Final   Special Requests BOTTLES DRAWN AEROBIC AND ANAEROBIC 5CC  Final   Culture NO GROWTH 5 DAYS  Final   Report Status 11/18/2015 FINAL  Final  Culture, blood (Routine x 2)     Status: None   Collection Time: 11/13/15  9:35 PM  Result Value Ref Range Status   Specimen Description BLOOD RIGHT ARM  Final   Special Requests IN PEDIATRIC BOTTLE 4CC  Final   Culture NO GROWTH 5 DAYS  Final   Report Status 11/18/2015 FINAL  Final  Urine culture     Status: None   Collection Time:  11/13/15 10:04 PM  Result Value Ref Range Status   Specimen Description URINE, RANDOM  Final   Special Requests NONE  Final   Culture NO GROWTH  Final   Report Status 11/15/2015 FINAL  Final  MRSA PCR Screening     Status: None   Collection Time: 11/14/15  5:41 PM  Result Value Ref Range Status   MRSA by PCR NEGATIVE NEGATIVE Final    Comment:        The GeneXpert MRSA Assay (FDA approved for NASAL specimens only), is one component of a comprehensive MRSA colonization surveillance program. It is not intended to diagnose MRSA infection nor to guide or monitor treatment for MRSA infections.   Culture, blood (routine x 2)     Status: None (Preliminary result)   Collection Time: 11/18/15  3:10 PM  Result Value Ref Range Status   Specimen Description BLOOD LEFT ANTECUBITAL  Final   Special Requests BOTTLES DRAWN AEROBIC AND ANAEROBIC 10CC  Final   Culture NO GROWTH 2 DAYS  Final   Report Status PENDING  Incomplete  Culture, blood (Routine X 2) w Reflex to ID Panel     Status: None (Preliminary result)   Collection Time: 11/18/15  5:42 PM  Result Value Ref Range Status   Specimen Description BLOOD PICC LINE  Final   Special Requests BOTTLES DRAWN AEROBIC AND ANAEROBIC 5CC  Final   Culture NO GROWTH 2 DAYS  Final   Report Status PENDING  Incomplete     ASSESSMENT: He continues to be febrile but his white blood cell count is steadily decreasing. He underwent upper GI study this morning which revealed no evidence of leak but did show his gastric remnant and small bowel in his hiatal hernia with small bowel obstruction. I believe his fevers are somehow related to the postoperative inflammation. I would continue his current antibiotic regimen for now. Repeat blood cultures remain negative.  PLAN: 1. Continue current antibiotics  Michel Bickers, MD [facility] for [place] [phone] pager   [phone] cell 11/20/2015, 4:33 PM

## 2015-11-20 NOTE — Progress Notes (Signed)
The lipids line infusing via a pump into the RT arm PICC keeps alarming occlusion.  PICC flushed with 10cc NS easily with brisk blood return. Via red port.  I called pharmacy and spoke with a pharmacist regarding lipid filter being occluded.   She informed me it would be ok to leave the lipids off until we hang a new TNA/lipids this evening.

## 2015-11-20 NOTE — Progress Notes (Signed)
Pt noted to have shortness of breath, with nasal flaring, RR 22. Pt put on O2 at 3lpm o2 sat 96%, family stated that he started having breathing problem after he had the ativan IV at 4pm, called MD to report with orders made, will further monitor.

## 2015-11-20 NOTE — Progress Notes (Signed)
Discussed UGI findings with family. Discussed bowel obstruction due to hiatal hernia and plan for surgery tomorrow to reduce the hernia, relieve the obstruction, perform gastrostomy tube as fixation point for stomach and possibly repair hiatal hernia  Gurney Maxin, M.D. [facility] Pg: [phone]

## 2015-11-20 NOTE — Progress Notes (Signed)
[facility] Progress Note     Subjective: Lying comfortably in bed. No acute distress. Slept well overnight still having fevers. No pain or nausea.    Objective: Vital signs in last 24 hours: Temp:  [98.2 F (36.8 C)-102.4 F (39.1 C)] 98.2 F (36.8 C) (08/24 0538) Pulse Rate:  [101-118] 118 (08/24 0538) Resp:  [18] 18 (08/24 0538) BP: (121-139)/(71-75) 136/71 (08/24 0538) SpO2:  [92 %-100 %] 92 % (08/24 0538) Last BM Date: 11/16/15  Intake/Output from previous day: 08/23 0701 - 08/24 0700 In: 2816.8 [I.V.:2566.8; IV Piggyback:250] Out: 1255 [Urine:1255] Intake/Output this shift: Total I/O In: 392.2 [I.V.:392.2] Out: -   PE: Gen:  Alert, NAD, cooperative HEENT: [place]/AT Card:  Tachycardic, regular rhythm  Pulm:  CTA, no W/R/R Abd: Soft, Nt, minimally distended, midline wound pink and healthy with granulating wound bed, no surrounding erythema/induration/warmth. RLQ colostomy pink and mildly prolapsed, ostomy sweat in bag. No stool. Ext:  Bilateral lower extremity edema   Lab Results:   Recent Labs  11/19/15 0455 11/20/15 0510  WBC 17.3* 15.3*  HGB 7.4* 6.9*  HCT 23.8* 21.9*  PLT 535* 497*   BMET  Recent Labs  11/19/15 0455 11/20/15 0510  NA 134* 131*  K 4.0 3.9  CL 99* 98*  CO2 25 26  GLUCOSE 161* 122*  BUN 8 10  CREATININE 0.78 0.75  CALCIUM 7.5* 7.3*   CMP     Component Value Date/Time   NA 131 (L) 11/20/2015 0510   K 3.9 11/20/2015 0510   CL 98 (L) 11/20/2015 0510   CO2 26 11/20/2015 0510   GLUCOSE 122 (H) 11/20/2015 0510   BUN 10 11/20/2015 0510   CREATININE 0.75 11/20/2015 0510   CALCIUM 7.3 (L) 11/20/2015 0510   PROT 5.0 (L) 11/20/2015 0510   ALBUMIN 1.5 (L) 11/20/2015 0510   AST 18 11/20/2015 0510   ALT 11 (L) 11/20/2015 0510   ALKPHOS 113 11/20/2015 0510   BILITOT 0.5 11/20/2015 0510   GFRNONAA >60 11/20/2015 0510   GFRAA >60 11/20/2015 0510   Studies/Results: No results found.  Anti-infectives: Anti-infectives     Start     Dose/Rate Route Frequency Ordered Stop   11/19/15 0400  vancomycin (VANCOCIN) IVPB 750 mg/150 ml premix     750 mg 150 mL/hr over 60 Minutes Intravenous Every 12 hours 11/18/15 1459     11/18/15 1600  fluconazole (DIFLUCAN) IVPB 400 mg     400 mg 100 mL/hr over 120 Minutes Intravenous Every 24 hours 11/18/15 1452     11/18/15 1530  vancomycin (VANCOCIN) 1,250 mg in sodium chloride 0.9 % 250 mL IVPB     1,250 mg 166.7 mL/hr over 90 Minutes Intravenous  Once 11/18/15 1452 11/18/15 1940   11/14/15 0600  piperacillin-tazobactam (ZOSYN) IVPB 3.375 g     3.375 g 12.5 mL/hr over 240 Minutes Intravenous Every 8 hours 11/13/15 2328     11/13/15 2300  piperacillin-tazobactam (ZOSYN) IVPB 3.375 g     3.375 g 100 mL/hr over 30 Minutes Intravenous  Once 11/13/15 2257 11/14/15 0217     Assessment/Plan 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 UGI today to assess for leak.   Fever - 102.4 @ 0246  Leukocytosis - 15 from 17 yesterday - CT Chest/Abd 11/14/15 negative for intraabdominal abscess/fluid collection, negative for developing pneumonia - B/l LE Duplex one week ago was  negative  - Urinalysis 11/18/15 negative - repeat peripheral and central (R PICC) blood cultures repeated 11/08/15 - pending ID - Zosyn 11/13/15 >>, vancomycin and fluconazole 8/22 >> VTE - lovenox FEN - PICC/TNA/clears. Hyponatremia; continue to monitor Dispo - fever, unknown source; Consult ID    LOS: 6 days    Miguel [name] 11/20/2015, 8:19 AM

## 2015-11-21 ENCOUNTER — Inpatient Hospital Stay (HOSPITAL_COMMUNITY): Payer: 59 | Admitting: Certified Registered Nurse Anesthetist

## 2015-11-21 ENCOUNTER — Encounter (HOSPITAL_COMMUNITY): Payer: Self-pay | Admitting: Certified Registered"

## 2015-11-21 ENCOUNTER — Inpatient Hospital Stay (HOSPITAL_COMMUNITY): Payer: 59 | Admitting: Anesthesiology

## 2015-11-21 ENCOUNTER — Inpatient Hospital Stay (HOSPITAL_COMMUNITY): Payer: 59

## 2015-11-21 ENCOUNTER — Encounter (HOSPITAL_COMMUNITY): Admission: EM | Disposition: A | Discharge status: Home Health | Payer: Self-pay | Source: Home / Self Care

## 2015-11-21 DIAGNOSIS — K659 Peritonitis, unspecified: Secondary | ICD-10-CM

## 2015-11-21 DIAGNOSIS — J9601 Acute respiratory failure with hypoxia: Secondary | ICD-10-CM

## 2015-11-21 DIAGNOSIS — R5082 Postprocedural fever: Secondary | ICD-10-CM

## 2015-11-21 DIAGNOSIS — A419 Sepsis, unspecified organism: Secondary | ICD-10-CM

## 2015-11-21 HISTORY — PX: GASTROSTOMY TUBE PLACEMENT: SHX655

## 2015-11-21 HISTORY — PX: HIATAL HERNIA REPAIR: SHX195

## 2015-11-21 LAB — POCT I-STAT 7, (LYTES, BLD GAS, ICA,H+H)
ACID-BASE DEFICIT: 1 mmol/L (ref 0.0–2.0)
Acid-base deficit: 4 mmol/L — ABNORMAL HIGH (ref 0.0–2.0)
BICARBONATE: 24.8 meq/L — AB (ref 20.0–24.0)
BICARBONATE: 20.4 meq/L (ref 20.0–24.0)
CALCIUM ION: 1.02 mmol/L — AB (ref 1.12–1.23)
CALCIUM ION: 1.04 mmol/L — AB (ref 1.12–1.23)
HCT: 31 % — ABNORMAL LOW (ref 39.0–52.0)
HCT: 36 % — ABNORMAL LOW (ref 39.0–52.0)
HEMOGLOBIN: 12.2 g/dL — AB (ref 13.0–17.0)
Hemoglobin: 10.5 g/dL — ABNORMAL LOW (ref 13.0–17.0)
O2 SAT: 99 %
O2 Saturation: 90 %
PH ART: 7.366 (ref 7.350–7.450)
PO2 ART: 166 mmHg — AB (ref 80.0–100.0)
PO2 ART: 60 mmHg — AB (ref 80.0–100.0)
Potassium: 3.9 mmol/L (ref 3.5–5.1)
Potassium: 4.2 mmol/L (ref 3.5–5.1)
SODIUM: 133 mmol/L — AB (ref 135–145)
SODIUM: 133 mmol/L — AB (ref 135–145)
TCO2: 26 mmol/L (ref 0–100)
TCO2: 21 mmol/L (ref 0–100)
pCO2 arterial: 43.3 mmHg (ref 35.0–45.0)
pCO2 arterial: 35.2 mmHg (ref 35.0–45.0)
pH, Arterial: 7.371 (ref 7.350–7.450)

## 2015-11-21 LAB — GLUCOSE, CAPILLARY
GLUCOSE-CAPILLARY: 145 mg/dL — AB (ref 65–99)
GLUCOSE-CAPILLARY: 217 mg/dL — AB (ref 65–99)
Glucose-Capillary: 192 mg/dL — ABNORMAL HIGH (ref 65–99)
Glucose-Capillary: 147 mg/dL — ABNORMAL HIGH (ref 65–99)

## 2015-11-21 LAB — CBC
HCT: 21.8 % — ABNORMAL LOW (ref 39.0–52.0)
HCT: 33.9 % — ABNORMAL LOW (ref 39.0–52.0)
Hemoglobin: 6.8 g/dL — CL (ref 13.0–17.0)
Hemoglobin: 10.7 g/dL — ABNORMAL LOW (ref 13.0–17.0)
MCH: 27 pg (ref 26.0–34.0)
MCH: 27.7 pg (ref 26.0–34.0)
MCHC: 31.2 g/dL (ref 30.0–36.0)
MCHC: 31.6 g/dL (ref 30.0–36.0)
MCV: 86.5 fL (ref 78.0–100.0)
MCV: 87.8 fL (ref 78.0–100.0)
PLATELETS: 440 10*3/uL — AB (ref 150–400)
Platelets: 386 10*3/uL (ref 150–400)
RBC: 2.52 MIL/uL — AB (ref 4.22–5.81)
RBC: 3.86 MIL/uL — ABNORMAL LOW (ref 4.22–5.81)
RDW: 18.1 % — AB (ref 11.5–15.5)
RDW: 17 % — ABNORMAL HIGH (ref 11.5–15.5)
WBC: 16.5 10*3/uL — AB (ref 4.0–10.5)
WBC: 19.3 10*3/uL — ABNORMAL HIGH (ref 4.0–10.5)

## 2015-11-21 LAB — BASIC METABOLIC PANEL
Anion gap: 7 (ref 5–15)
Anion gap: 5 (ref 5–15)
BUN: 11 mg/dL (ref 6–20)
BUN: 14 mg/dL (ref 6–20)
CHLORIDE: 100 mmol/L — AB (ref 101–111)
CO2: 28 mmol/L (ref 22–32)
CO2: 22 mmol/L (ref 22–32)
CREATININE: 0.77 mg/dL (ref 0.61–1.24)
Calcium: 7.2 mg/dL — ABNORMAL LOW (ref 8.9–10.3)
Calcium: 6.5 mg/dL — ABNORMAL LOW (ref 8.9–10.3)
Chloride: 106 mmol/L (ref 101–111)
Creatinine, Ser: 0.76 mg/dL (ref 0.61–1.24)
GFR calc Af Amer: >60 mL/min (ref >60)
GFR calc Af Amer: >60 mL/min (ref >60)
GFR calc non Af Amer: >60 mL/min (ref >60)
GFR calc non Af Amer: >60 mL/min (ref >60)
Glucose, Bld: 126 mg/dL — ABNORMAL HIGH (ref 65–99)
Glucose, Bld: 170 mg/dL — ABNORMAL HIGH (ref 65–99)
Potassium: 3.4 mmol/L — ABNORMAL LOW (ref 3.5–5.1)
Potassium: 4.3 mmol/L (ref 3.5–5.1)
SODIUM: 135 mmol/L (ref 135–145)
Sodium: 133 mmol/L — ABNORMAL LOW (ref 135–145)

## 2015-11-21 LAB — PREPARE RBC (CROSSMATCH)

## 2015-11-21 LAB — LACTIC ACID, PLASMA: Lactic Acid, Venous: 1.6 mmol/L (ref 0.5–1.9)

## 2015-11-21 LAB — VANCOMYCIN, TROUGH: VANCOMYCIN TR: 6 ug/mL — AB (ref 15–20)

## 2015-11-21 LAB — MAGNESIUM: MAGNESIUM: 2 mg/dL (ref 1.7–2.4)

## 2015-11-21 SURGERY — REPAIR, HERNIA, HIATAL
Anesthesia: General | Site: Abdomen

## 2015-11-21 MED ORDER — 0.9 % SODIUM CHLORIDE (POUR BTL) OPTIME
TOPICAL | Status: DC | PRN
  Administered 2015-11-21 (×2): 1000 mL

## 2015-11-21 MED ORDER — MEPERIDINE HCL 25 MG/ML IJ SOLN: 6.2500 mg | INTRAMUSCULAR | Status: DC | PRN

## 2015-11-21 MED ORDER — SODIUM CHLORIDE 0.9 % IV BOLUS (SEPSIS)
1000.0000 mL | Freq: Once | INTRAVENOUS | Status: AC
  Administered 2015-11-21: 1000 mL via INTRAVENOUS

## 2015-11-21 MED ORDER — FENTANYL CITRATE (PF) 100 MCG/2ML IJ SOLN
INTRAMUSCULAR | Status: AC
  Filled 2015-11-21: qty 2

## 2015-11-21 MED ORDER — VANCOMYCIN HCL IN DEXTROSE 1-5 GM/200ML-% IV SOLN
1000.0000 mg | INTRAVENOUS | Status: AC
  Administered 2015-11-21: 1000 mg via INTRAVENOUS
  Filled 2015-11-21: qty 200

## 2015-11-21 MED ORDER — LIDOCAINE 2% (20 MG/ML) 5 ML SYRINGE
INTRAMUSCULAR | Status: AC
  Filled 2015-11-21: qty 5

## 2015-11-21 MED ORDER — FAMOTIDINE IN NACL 20-0.9 MG/50ML-% IV SOLN
20.0000 mg | Freq: Two times a day (BID) | INTRAVENOUS | Status: DC
  Administered 2015-11-21: 20 mg via INTRAVENOUS
  Filled 2015-11-21: qty 50

## 2015-11-21 MED ORDER — PROMETHAZINE HCL 25 MG/ML IJ SOLN: 6.2500 mg | INTRAMUSCULAR | Status: DC | PRN

## 2015-11-21 MED ORDER — CHLORHEXIDINE GLUCONATE 0.12 % MT SOLN
15.0000 mL | Freq: Two times a day (BID) | OROMUCOSAL | Status: DC
  Administered 2015-11-22 – 2015-12-04 (×21): 15 mL via OROMUCOSAL
  Filled 2015-11-21 (×21): qty 15

## 2015-11-21 MED ORDER — ROCURONIUM BROMIDE 10 MG/ML (PF) SYRINGE
PREFILLED_SYRINGE | INTRAVENOUS | Status: AC
  Filled 2015-11-21: qty 10

## 2015-11-21 MED ORDER — PROPOFOL 1000 MG/100ML IV EMUL
5.0000 ug/kg/min | INTRAVENOUS | Status: DC
  Administered 2015-11-21: 10 ug/kg/min via INTRAVENOUS
  Filled 2015-11-21 (×2): qty 100

## 2015-11-21 MED ORDER — PROPOFOL 10 MG/ML IV BOLUS
INTRAVENOUS | Status: DC | PRN
  Administered 2015-11-21: 120 mg via INTRAVENOUS

## 2015-11-21 MED ORDER — LACTATED RINGERS IV SOLN
Freq: Once | INTRAVENOUS | Status: AC
  Administered 2015-11-21: 50 mL/h via INTRAVENOUS

## 2015-11-21 MED ORDER — CHLORHEXIDINE GLUCONATE 0.12 % MT SOLN
OROMUCOSAL | Status: AC
  Administered 2015-11-21: 15 mL
  Filled 2015-11-21: qty 15

## 2015-11-21 MED ORDER — LIDOCAINE HCL (CARDIAC) 20 MG/ML IV SOLN
INTRAVENOUS | Status: DC | PRN
  Administered 2015-11-21: 100 mg via INTRAVENOUS

## 2015-11-21 MED ORDER — ALBUMIN HUMAN 5 % IV SOLN
INTRAVENOUS | Status: DC | PRN
  Administered 2015-11-21: 10:00:00 via INTRAVENOUS

## 2015-11-21 MED ORDER — STERILE WATER FOR IRRIGATION IR SOLN
Status: DC | PRN
  Administered 2015-11-21: 1000 mL

## 2015-11-21 MED ORDER — PHENYLEPHRINE HCL 10 MG/ML IJ SOLN
INTRAMUSCULAR | Status: DC | PRN
  Administered 2015-11-21: 40 ug via INTRAVENOUS

## 2015-11-21 MED ORDER — SODIUM CHLORIDE 0.9 % IV SOLN
25.0000 ug/h | INTRAVENOUS | Status: DC
  Administered 2015-11-21: 100 ug/h via INTRAVENOUS
  Administered 2015-11-22: 150 ug/h via INTRAVENOUS
  Filled 2015-11-21 (×2): qty 50

## 2015-11-21 MED ORDER — SUCCINYLCHOLINE CHLORIDE 20 MG/ML IJ SOLN
INTRAMUSCULAR | Status: DC | PRN
  Administered 2015-11-21: 100 mg via INTRAVENOUS

## 2015-11-21 MED ORDER — FAT EMULSION 20 % IV EMUL
240.0000 mL | INTRAVENOUS | Status: DC
  Administered 2015-11-21: 240 mL via INTRAVENOUS
  Filled 2015-11-21: qty 250

## 2015-11-21 MED ORDER — FENTANYL CITRATE (PF) 100 MCG/2ML IJ SOLN
INTRAMUSCULAR | Status: AC
  Administered 2015-11-21: 25 ug via INTRAVENOUS
  Filled 2015-11-21: qty 2

## 2015-11-21 MED ORDER — CHLORHEXIDINE GLUCONATE 0.12% ORAL RINSE (MEDLINE KIT): 15.0000 mL | Freq: Two times a day (BID) | OROMUCOSAL | Status: DC

## 2015-11-21 MED ORDER — SUGAMMADEX SODIUM 200 MG/2ML IV SOLN
INTRAVENOUS | Status: AC
  Filled 2015-11-21: qty 2

## 2015-11-21 MED ORDER — PROPOFOL 10 MG/ML IV BOLUS
INTRAVENOUS | Status: DC | PRN
  Administered 2015-11-21: 100 mg via INTRAVENOUS

## 2015-11-21 MED ORDER — LACTATED RINGERS IV SOLN
INTRAVENOUS | Status: DC | PRN
  Administered 2015-11-21 (×2): via INTRAVENOUS

## 2015-11-21 MED ORDER — FENTANYL BOLUS VIA INFUSION
50.0000 ug | INTRAVENOUS | Status: DC | PRN
  Administered 2015-11-21: 50 ug via INTRAVENOUS
  Filled 2015-11-21: qty 50

## 2015-11-21 MED ORDER — VANCOMYCIN HCL IN DEXTROSE 1-5 GM/200ML-% IV SOLN
1000.0000 mg | Freq: Three times a day (TID) | INTRAVENOUS | Status: DC
  Administered 2015-11-22 – 2015-11-24 (×7): 1000 mg via INTRAVENOUS
  Filled 2015-11-21 (×9): qty 200

## 2015-11-21 MED ORDER — FENTANYL CITRATE (PF) 100 MCG/2ML IJ SOLN
50.0000 ug | Freq: Once | INTRAMUSCULAR | Status: AC
  Administered 2015-11-24: 50 ug via INTRAVENOUS
  Filled 2015-11-21: qty 2

## 2015-11-21 MED ORDER — FENTANYL CITRATE (PF) 100 MCG/2ML IJ SOLN
25.0000 ug | INTRAMUSCULAR | Status: DC | PRN
  Administered 2015-11-21 (×2): 25 ug via INTRAVENOUS

## 2015-11-21 MED ORDER — PROPOFOL 10 MG/ML IV BOLUS
INTRAVENOUS | Status: AC
  Filled 2015-11-21: qty 20

## 2015-11-21 MED ORDER — ROCURONIUM BROMIDE 100 MG/10ML IV SOLN
INTRAVENOUS | Status: DC | PRN
  Administered 2015-11-21: 40 mg via INTRAVENOUS
  Administered 2015-11-21: 20 mg via INTRAVENOUS
  Administered 2015-11-21: 10 mg via INTRAVENOUS
  Administered 2015-11-21: 20 mg via INTRAVENOUS

## 2015-11-21 MED ORDER — SUCCINYLCHOLINE CHLORIDE 20 MG/ML IJ SOLN
INTRAMUSCULAR | Status: DC | PRN
  Administered 2015-11-21: 120 mg via INTRAVENOUS

## 2015-11-21 MED ORDER — SODIUM CHLORIDE 0.9 % IV SOLN
Freq: Once | INTRAVENOUS | Status: AC
  Administered 2015-11-21: 10:00:00 via INTRAVENOUS

## 2015-11-21 MED ORDER — ANTISEPTIC ORAL RINSE SOLUTION (CORINZ): 7.0000 mL | Freq: Four times a day (QID) | OROMUCOSAL | Status: DC

## 2015-11-21 MED ORDER — PHENYLEPHRINE HCL 10 MG/ML IJ SOLN
INTRAMUSCULAR | Status: DC | PRN
  Administered 2015-11-21: 50 ug/min via INTRAVENOUS

## 2015-11-21 MED ORDER — FENTANYL CITRATE (PF) 100 MCG/2ML IJ SOLN
INTRAMUSCULAR | Status: AC
Start: 2015-11-21 — End: 2015-11-21
  Filled 2015-11-21: qty 2

## 2015-11-21 MED ORDER — PIPERACILLIN-TAZOBACTAM 3.375 G IVPB
3.3750 g | INTRAVENOUS | Status: DC
  Filled 2015-11-21: qty 50

## 2015-11-21 MED ORDER — FENTANYL CITRATE (PF) 100 MCG/2ML IJ SOLN
INTRAMUSCULAR | Status: DC | PRN
  Administered 2015-11-21 (×2): 25 ug via INTRAVENOUS
  Administered 2015-11-21: 100 ug via INTRAVENOUS
  Administered 2015-11-21: 50 ug via INTRAVENOUS

## 2015-11-21 MED ORDER — SUCCINYLCHOLINE CHLORIDE 200 MG/10ML IV SOSY
PREFILLED_SYRINGE | INTRAVENOUS | Status: AC
  Filled 2015-11-21: qty 10

## 2015-11-21 MED ORDER — ORAL CARE MOUTH RINSE
15.0000 mL | Freq: Two times a day (BID) | OROMUCOSAL | Status: DC
  Administered 2015-11-22 – 2015-12-04 (×21): 15 mL via OROMUCOSAL

## 2015-11-21 MED ORDER — HYDROMORPHONE HCL 1 MG/ML IJ SOLN: 0.2500 mg | INTRAMUSCULAR | Status: DC | PRN

## 2015-11-21 MED ORDER — SUGAMMADEX SODIUM 200 MG/2ML IV SOLN
INTRAVENOUS | Status: DC | PRN
  Administered 2015-11-21: 200 mg via INTRAVENOUS

## 2015-11-21 MED ORDER — TRACE MINERALS CR-CU-MN-SE-ZN 10-1000-500-60 MCG/ML IV SOLN
INTRAVENOUS | Status: AC
  Administered 2015-11-21: 17:00:00 via INTRAVENOUS
  Filled 2015-11-21: qty 1920

## 2015-11-21 MED ORDER — POTASSIUM CHLORIDE 10 MEQ/50ML IV SOLN
10.0000 meq | INTRAVENOUS | Status: AC
  Filled 2015-11-21 (×3): qty 50

## 2015-11-21 MED ORDER — MIDAZOLAM HCL 2 MG/2ML IJ SOLN
INTRAMUSCULAR | Status: AC
  Filled 2015-11-21: qty 2

## 2015-11-21 SURGICAL SUPPLY — 57 items
BAG URINE LEG 500ML (DRAIN) ×6 IMPLANT
BIOPATCH RED 1 DISK 7.0 (GAUZE/BANDAGES/DRESSINGS) ×6 IMPLANT
BIOPATCH RED 1IN DISK 7.0MM (GAUZE/BANDAGES/DRESSINGS) ×3
BLADE SURG ROTATE 9660 (MISCELLANEOUS) ×3 IMPLANT
CANISTER SUCTION 2500CC (MISCELLANEOUS) ×5 IMPLANT
CANISTER WOUND CARE 500ML ATS (WOUND CARE) ×3 IMPLANT
CATH FOLEY 2WAY SLVR 30CC 22FR (CATHETERS) ×6 IMPLANT
CATH GASTROSTOMY 20FR (CATHETERS) ×3 IMPLANT
CATH MUSHROOM 22FR (CATHETERS) ×6 IMPLANT
CLIP TI LARGE 6 (CLIP) IMPLANT
COVER SURGICAL LIGHT HANDLE (MISCELLANEOUS) ×4 IMPLANT
DRAIN CHANNEL 19F RND (DRAIN) ×6 IMPLANT
DRAIN PENROSE 1/2X36 STERILE (WOUND CARE) IMPLANT
DRAPE LAPAROSCOPIC ABDOMINAL (DRAPES) ×4 IMPLANT
DRSG TEGADERM 2-3/8X2-3/4 SM (GAUZE/BANDAGES/DRESSINGS) ×12 IMPLANT
DRSG VAC ATS LRG SENSATRAC (GAUZE/BANDAGES/DRESSINGS) ×3 IMPLANT
ELECT REM PT RETURN 9FT ADLT (ELECTROSURGICAL) ×4
ELECTRODE REM PT RTRN 9FT ADLT (ELECTROSURGICAL) ×2 IMPLANT
EVACUATOR SILICONE 100CC (DRAIN) ×6 IMPLANT
GAUZE SPONGE 4X4 12PLY STRL (GAUZE/BANDAGES/DRESSINGS) ×4 IMPLANT
GLOVE BIOGEL PI IND STRL 7.0 (GLOVE) ×1 IMPLANT
GLOVE BIOGEL PI IND STRL 8 (GLOVE) ×2 IMPLANT
GLOVE BIOGEL PI INDICATOR 7.0 (GLOVE) ×2
GLOVE BIOGEL PI INDICATOR 8 (GLOVE) ×2
GLOVE ECLIPSE 7.5 STRL STRAW (GLOVE) ×4 IMPLANT
GLOVE SURG SS PI 7.0 STRL IVOR (GLOVE) ×3 IMPLANT
GOWN STRL REUS W/ TWL LRG LVL3 (GOWN DISPOSABLE) ×5 IMPLANT
GOWN STRL REUS W/TWL LRG LVL3 (GOWN DISPOSABLE) ×12
KIT BASIN OR (CUSTOM PROCEDURE TRAY) ×4 IMPLANT
KIT ROOM TURNOVER OR (KITS) ×4 IMPLANT
LIGASURE IMPACT 36 18CM CVD LR (INSTRUMENTS) ×3 IMPLANT
NS IRRIG 1000ML POUR BTL (IV SOLUTION) ×4 IMPLANT
PACK GENERAL/GYN (CUSTOM PROCEDURE TRAY) ×4 IMPLANT
PAD ARMBOARD 7.5X6 YLW CONV (MISCELLANEOUS) ×8 IMPLANT
PLUG CATH AND CAP STER (CATHETERS) ×3 IMPLANT
RELOAD PROXIMATE 75MM BLUE (ENDOMECHANICALS) ×12 IMPLANT
RELOAD STAPLE 75 3.8 BLU REG (ENDOMECHANICALS) IMPLANT
SPECIMEN JAR LARGE (MISCELLANEOUS) ×4 IMPLANT
SPONGE GAUZE 4X4 12PLY STER LF (GAUZE/BANDAGES/DRESSINGS) ×3 IMPLANT
SPONGE LAP 18X18 X RAY DECT (DISPOSABLE) ×6 IMPLANT
STAPLER 90 3.5 STAND SLIM (STAPLE) ×4
STAPLER 90 3.5 STD SLIM (STAPLE) ×1 IMPLANT
STAPLER PROXIMATE 75MM BLUE (STAPLE) ×3 IMPLANT
STAPLER VISISTAT 35W (STAPLE) ×4 IMPLANT
SUT ETHILON 2 0 FS 18 (SUTURE) ×12 IMPLANT
SUT PDS AB 0 CT 36 (SUTURE) ×12 IMPLANT
SUT PDS AB 1 TP1 54 (SUTURE) ×1 IMPLANT
SUT SILK 2 0 TIES 10X30 (SUTURE) ×4 IMPLANT
SUT SILK 3 0 SH CR/8 (SUTURE) ×12 IMPLANT
SUT SILK 3 0 TIES 10X30 (SUTURE) ×4 IMPLANT
TAPE CLOTH SURG 4X10 WHT LF (GAUZE/BANDAGES/DRESSINGS) ×3 IMPLANT
TOWEL OR 17X24 6PK STRL BLUE (TOWEL DISPOSABLE) ×4 IMPLANT
TOWEL OR 17X26 10 PK STRL BLUE (TOWEL DISPOSABLE) ×4 IMPLANT
TRAY FOLEY CATH 14FRSI W/METER (CATHETERS) ×4 IMPLANT
UNDERPAD 30X30 (UNDERPADS AND DIAPERS) IMPLANT
WATER STERILE IRR 1000ML POUR (IV SOLUTION) ×4 IMPLANT
YANKAUER SUCT BULB TIP NO VENT (SUCTIONS) ×4 IMPLANT

## 2015-11-21 NOTE — Progress Notes (Signed)
Patient ID: Miguel [name], male   DOB: 08-31-1950, 65 y.o.   MRN: [id number]          [facility] for Infectious Disease    Date of Admission:  11/13/2015   Total days of antibiotics 27         His fever and leukocytosis seemed to be slowly improving. He went back for surgery today to repair his hiatal hernia. The operative note indicated that there was" "perforation along incarcerated efferent limb". He underwent partial small bowel resection. I recommend continuing his current 3 drug regimen of piperacillin tazobactam, vancomycin and fluconazole. Please call Dr. Lita Mains [phone]) for any infectious disease questions this weekend.         Michel Bickers, MD [facility] for Infectious [facility] [phone] pager   [phone] cell 04/01/2015, 1:32 PM

## 2015-11-21 NOTE — Progress Notes (Signed)
[facility] Progress Note Patient Name: Miguel [name] DOB: 01-31-1951 MRN: [id number]   Date of Service  11/21/2015  HPI/Events of Note  Hypotension - BP = 65/55 by A-line and HR = 121.   eICU Interventions  Will order: 1. Bolus with 0.9 NaCl 1 liter IV over 1 hour now. 2. Monitor CVP.      Intervention Category Major Interventions: Hypotension - evaluation and management  Javione Gunawan [place] 11/21/2015, 4:53 PM

## 2015-11-21 NOTE — Progress Notes (Signed)
PARENTERAL NUTRITION CONSULT NOTE - FOLLOW UP  Pharmacy Consult for TPN Indication: Intolerance to enteral feeding  Allergies  Allergen Reactions  . No Known Allergies     Patient Measurements: Height: '5\' 6"'  (167.6 cm) Weight: 136 lb 9.6 oz (62 kg) IBW/kg (Calculated) : 63.8  Vital Signs: Temp: 99.8 F (37.7 C) (08/25 0431) Temp Source: Oral (08/25 0431) BP: 133/70 (08/25 0431) Pulse Rate: 94 (08/25 0431) Intake/Output from previous day: 08/24 0701 - 08/25 0700 In: 3503.5 [P.O.:360; I.V.:2458.5; Blood:335; IV Piggyback:350] Out: 4920 [Urine:1725] Intake/Output from this shift: Total I/O In: 3120 [I.V.:2200; Blood:670; IV Piggyback:250] Out: 1007 [Urine:395; Other:600; Blood:600]  Labs:  Recent Labs  11/19/15 0455 11/20/15 0510 11/21/15 0430  WBC 17.3* 15.3* 16.5*  HGB 7.4* 6.9* 6.8*  HCT 23.8* 21.9* 21.8*  PLT 535* 497* 440*     Recent Labs  11/19/15 0455 11/20/15 0510 11/21/15 0430  NA 134* 131* 135  K 4.0 3.9 3.4*  CL 99* 98* 100*  CO2 '25 26 28  ' GLUCOSE 161* 122* 126*  BUN '8 10 11  ' CREATININE 0.78 0.75 0.77  CALCIUM 7.5* 7.3* 7.2*  MG  --  1.7 2.0  PHOS 2.5 2.5  --   PROT  --  5.0*  --   ALBUMIN  --  1.5*  --   AST  --  18  --   ALT  --  11*  --   ALKPHOS  --  113  --   BILITOT  --  0.5  --    Estimated Creatinine Clearance: 80.7 mL/min (by C-G formula based on SCr of 0.8 mg/dL).    Recent Labs  11/20/15 2006 11/20/15 2343 11/21/15 0421  GLUCAP 149* 143* 145*    Medications:  Scheduled:  . enoxaparin (LOVENOX) injection  40 mg Subcutaneous Q24H  . fluconazole (DIFLUCAN) IV  400 mg Intravenous Q24H  . insulin aspart  0-9 Units Subcutaneous Q4H  . piperacillin-tazobactam (ZOSYN)  IV  3.375 g Intravenous Q8H  . sodium chloride flush  10-40 mL Intracatheter Q12H  . tamsulosin  0.4 mg Oral QPC supper  . vancomycin  750 mg Intravenous Q12H    Insulin Requirements in the past 24 hours:  8units of sensitive SSI  Current  Nutrition: NPO Clinimix E 5/15 at 26m/hr IV lipid emulsions at 176mhr NS + 20K at 508mr  Nutritional Goals:per RD note 8/22 1800-2000 kCal, 85-100grams of protein per day Clinimix E 5/15at 80 ml/hrand 20% lipid emulsion at 10 ml/hr  Admit: [age] male who presented to ED 8/17/2017with fever and N/V admitted with sepsis. He has a recent discharge after repair of a bleeding ulcer and was on TPN that admission. Repeat CT with thickening of the gastrojejunostomy limb of his anastomosis as well as the duodenum, no leak, no increase in size of hiatal hernia, non obstructing distal left ureteral stone. Urology to be consulted. MD reports minimal nutrition over last couple weeks.  Assessment: Returned to OR this AM, awaiting procedural note.  Abdominal tissue culture collected.  Surgeries/Procedures: 8/25:  Open reduction of hiatal hernia & placement GT; possible hernia repair 8/3: Billroth II repair, partial gastrectomy for giant ulcer 06/2015: perforated sigmoid diverticulitis s/p colostomy   GI:  Albumin low at 1.6. Prealbumin down to 4.4. Advanced to clears, currently NPO s/p OR this AM.  No pain, nausea.  Repeat CT scan later this week. Endo: no hx DM, CBGs 143-145, controlled. Lytes: K 3.4, Mg 2 Renal:  AKI resolved. Cr < 1, CrCl ~  42m/min. UOP good at 1.225mkg.hr. NS + 20K @ 50 ml/hr; ureteral stone should pass w/o intervention per Uro, tamsulosin added Pulm: [place]-3L Cards: BP stable, tachy Hepatobil:  LFTs wnl, tbili wnl, alk phos wnl, TG wnl Neuro: intact. Dilaudid prn ID:  Zosyn + vancomycin for FUO, ?enteritis, no sign intra-abd process.Blood cx repeat 8/22- ntd.  Tm 102.4.  Fevers perhaps related to bleeding ulcer/surgery per ID, WBC trending down 30 > 15.3 since 8/19. Best Practices: enox 40  TPN Access: Double lumen PICC 8/19 TPN start date: 8/20 >>  Plan: ContClinimix E 5/15to goal rate of 8080mr and 20% lipid emulsion at 54m84m This provides 96g of  protein and 1843 kCals per day meeting 100% of protein andkCal needs AddPepcid 40mg87mI and TE to TPN K-runs x 3 Cont NS with 20K to 50 ml/hr - monitor K+ ContinuesensitiveSSI and adjust as needed TPN labs qMon/Thur   KendrGracy BruinsrmD Clinical Pharmacist [facility]

## 2015-11-21 NOTE — Op Note (Signed)
Preoperative diagnosis: sepsis, incarcerated hiatal hernia  Postoperative diagnosis: sepsis, incarcerated hiatal hernia, small bowel perforation  Procedure: open reduction of hiatal hernia repair of hiatal hernia resection of small bowel with anastomosis placement of gastrostomy tube  placement of duodenostomy tube  Surgeon: Gurney Maxin, M.D.  Asst: Romana Juniper, MD  Anesthesia: general  Indications for procedure: Miguel [name] is a 65 y.o. year old male with symptoms of abdominal pain and fever. Patient was 3 weeks out from antrectomy with gastrojejunostomy for obstructing antral ulcer. In the hospital he is put on antibiotics CT not show any abnormalities initially continued to have fevers and inability to tolerate liquids on further workup is identified the incarcerated hiatal hernia containing stomach and jejunal anastomosis with obstruction.  Department family he was consented for exploratory laparotomy with reduction of hiatal hernia placement of G-tube possible hiatal hernia repair brought to the operating room.  Description of procedure: The patient was brought into the operative suite. Anesthesia was administered with General endotracheal anesthesia. WHO checklist was applied. The patient was then placed in supine. The area was prepped and draped in the usual sterile fashion.  Next previous midline incision was reopened cautery was used to dissect down through subcutaneous tissues and enter the fascia without event. Upon entry there a few loose adhesions to the abdominal wall laser taking down bluntly falciform was tied off with 2-0 silk's and cautery was used to sever the ligament. The midline incision was expanded up to the left of the xiphoid. It was also extended to the lower midline as well. Omni retractor was put in place to retract the ribs. Blunt dissection was used to free the liver from the adhesive area beneath upon doing this there is a large amount of purulence  expressed. This was collected and sent for culture. Further dissection completely freed the left lobe of liver, which was retracted with Omni malleable. We then looked at the small bowel the ligament of Treitz was identified and run up to it appeared to be going into the chest consistent with aferrent limb. The rest of small bowel was identified and appeared free of obstruction not dilated.   Looking at the upper abdomen. There is a dilated portion of stomach in continuity with the duodenum initially this is thought that there was a non-resectional gastrojejunostomy and attempt was made to enter the lesser sac. It was later  identified as the duodenal stump and we attempted to enter the lesser sac was partial dissection of the retroperitoneum inferior to the pancreas. The duodenal stump appeared to be origin of the previously collected purulence. Further dissection identified the hiatus which did have both limbs of jejunum going up into the chest. Blunt dissection occurred to dissect away any adhesions of the contents of the hiatal hernia away from the hiatal hernia sac, this did not allow complete reduction of the contents. Therefore the left crus with severed with cautery. This allowed reduction of small bowel a large 20 cm loop of inflamed and dilated bowel was brought forward as well as more purulence and bile. Upon examination of this 20 cm loop there is a perforation which was approximately 30 cm distal to the gastrojejunostomy. In addition the stomach and a fragment limb of completely reduced and did not have any signs perforation or other damage. Further dissection into the hiatal hernia was performed to try to completely reduce the stomach and identify the esophagus however due to inflammation were unable to fully reduce stomach and felt that  we were doing more damage than good at this time.  We turned our attention to the perforated small bowel, the 20 cm injured segment was resected using 2 75 mm GIA  staplings. Mesentery was taken with LigaSure device. Anastomosis is made with 2 firings of 75 mm GIA, this took the anastomosis to within 5 cm of the GJ but along the opposite portion of intestine. The enterotomy was closed with a single firing of a 90 mm TA stapler.  Next to the left crus was sutured together using a 0 PDS in running fashion and used to partially close the hiatus.  Next a pursestring was put in the anterior aspect of the duodenal stump with 3-0 silk, a stab incision was made on the right upper abdomen, a 13 [place] Malecot was introduced through this hole, a duodenotomy was made and Malecot placed in the duodenotomy. Duodenotomy was sutured to the skin with 2-0 nylon. 2 drains were placed one coming in from the right upper abdomen was laid along the left liver up into the hiatus along the left side of the stomach. The second drain was placed through the right lower abdomen and laid along the duodenal stump. Both 19 [place] Blake drains were sutured in place with 2-0 nylon. Next to pursestrings were made along the lateral distal portion of the remaining stomach using 3-0 silk. A stab incision was made in the left upper abdomen and a 20 [place] G-tube was passed through this hole. Next a gastrotomy was made and the pursestring G-tube was introduced balloon was filled both pursestrings were tied down the stomach was pulled up to the abdominal wall and then 4 gastropexy stitches were placed in superior, lateral, medial, and inferior aspects using 2-0 silk. The G-tube was sutured in place in 3 locations using 2-0 nylon. Abdomen was irrigated. All counts are correct.  The incision was closed with 0 PDS in running fashion. Black Sponge vac was put in place.  Findings: perforation along incarcerated efferent limb  Specimen: small bowe, efferent limb  Implant: black sponge vac,  lower Right drain draining duodenum,  upper right drain draining hiatus,  right sided duodenostomy 69fr malecot,   left upper abdomen 20 french G tube   Blood loss: 62ml  Fluids per anesthesia  Local anesthesia: none  Complications: none  Gurney Maxin, M.D. General, Bariatric, & Minimally Invasive Surgery [facility]

## 2015-11-21 NOTE — Anesthesia Procedure Notes (Signed)
Procedure Name: Intubation Date/Time: 11/21/2015 8:00 AM Performed by: Lavell Luster Pre-anesthesia Checklist: Patient identified, Emergency Drugs available, Suction available, Patient being monitored and Timeout performed Patient Re-evaluated:Patient Re-evaluated prior to inductionOxygen Delivery Method: Circle system utilized Preoxygenation: Pre-oxygenation with 100% oxygen Intubation Type: IV induction, Cricoid Pressure applied and Rapid sequence Laryngoscope Size: Mac and 4 Grade View: Grade I Tube type: Oral Number of attempts: 1 Airway Equipment and Method: Stylet Placement Confirmation: ETT inserted through vocal cords under direct vision,  positive ETCO2 and breath sounds checked- equal and bilateral Secured at: 21 cm Tube secured with: Tape Dental Injury: Teeth and Oropharynx as per pre-operative assessment

## 2015-11-21 NOTE — Transfer of Care (Signed)
Immediate Anesthesia Transfer of Care Note  Patient: Miguel [name]  Procedure(s) Performed: Procedure(s): REDUCTION OF HIATAL HERNIA , REPAIR HIATAL HERNIA, RESECTION SMALL BOWEL WITH ANASTOMOSIS, PLACEMENT GASTROSTOMY TUBE, PLACEMENT DUODENOSTOMY TUBE (N/A)  Patient Location: PACU  Anesthesia Type:General  Level of Consciousness: awake, alert , patient cooperative and confused  Airway & Oxygen Therapy: Patient connected to face mask oxygen  Post-op Assessment: Post -op Vital signs reviewed and stable  Post vital signs: stable  Last Vitals:  Vitals:   11/20/15 2027 11/21/15 0431  BP:  133/70  Pulse: (!) 116 94  Resp: 20 18  Temp:  37.7 C    Last Pain:  Vitals:   11/21/15 0431  TempSrc: Oral  PainSc:       Patients Stated Pain Goal: 2 ([id number])  Complications: No apparent anesthesia complications

## 2015-11-21 NOTE — Consult Note (Signed)
PULMONARY / CRITICAL CARE MEDICINE   Name: Miguel [name] MRN: [id number] DOB: [birth year]    ADMISSION DATE:  11/13/2015 CONSULTATION DATE:  8/25  REFERRING MD: kingsinger   CHIEF COMPLAINT:  Acute respiratory failure   HISTORY OF PRESENT ILLNESS:   This is a 65 year old male who initially underwent initially billroth II w/ partial gastrectomy for giant ulcer back on 8/3. He was re-admitted w/ fever and confusion. Found to have large hiatal hernia containing the gastrojejunostomy limb. Has been treated w/ broad spec abx but ultimately went to OR on 8/25 to reduce hernia and repair bowel obstruction. He was found to have perforation and required additional SB resection. He was initially hypotensive and required volume resuscitation post-op. PCCM asked to see for hypotension and medical management. On PCCM arrival to PACU pt was in acute respiratory distress w/ RR in 40s and no improvement in WOB after application on NIPPV. PO2 in 60s in spite of O2. He was intubated by anesthesia for on-going critical care support.   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  PAST SURGICAL HISTORY: He  has a past surgical history that includes laparotomy (N/A, 07/05/2015); Colon surgery; Hemorrhoid banding (X 2); Knee cartilage surgery (Right, 1971); Melanoma excision (2001); Tumor excision (2009); Esophagogastroduodenoscopy (N/A, 10/27/2015); Esophagogastroduodenoscopy (N/A, 10/29/2015); Esophagogastroduodenoscopy (N/A, 10/30/2015); Repair of perforated ulcer (N/A, 10/30/2015); and Removal of gastrointestinal stomatic  tumor of stomach (10/30/2015).  Allergies  Allergen Reactions  . No Known Allergies     No current facility-administered medications on file prior to encounter.    Current Outpatient Prescriptions on File Prior to Encounter   Medication Sig  . acetaminophen (TYLENOL) 500 MG tablet Take 500-1,000 mg by mouth every 8 (eight) hours as needed (for pain).   Marland Kitchen aspirin EC 81 MG tablet Take 81 mg by mouth daily after supper.   . Calcium Polycarbophil (FIBER-LAX PO) Take 2 capsules by mouth 2 (two) times daily as needed (constipation).  . diazepam (VALIUM) 2 MG tablet Take 1 tablet (2 mg total) by mouth every 6 (six) hours as needed for muscle spasms. (Patient taking differently: Take 1-2 mg by mouth every 6 (six) hours as needed for muscle spasms. )  . famotidine (PEPCID) 20 MG tablet Take 1 tablet (20 mg total) by mouth 2 (two) times daily.  . ferrous sulfate 325 (65 FE) MG tablet Take 325 mg by mouth daily.  . Multiple Vitamin (MULTIVITAMIN WITH MINERALS) TABS tablet Take 1 tablet by mouth daily after supper.   . Omega-3 Fatty Acids (FISH OIL) 1200 MG CAPS Take 2,400 mg by mouth daily after supper.   . ondansetron (ZOFRAN) 4 MG tablet Take 4 mg by mouth every 8 (eight) hours as needed for nausea or vomiting.  Marland Kitchen oxyCODONE-acetaminophen (PERCOCET/ROXICET) 5-325 MG tablet Take 1-2 tablets by mouth every 4 (four) hours as needed for moderate pain.  . polyethylene glycol (MIRALAX / GLYCOLAX) packet Take 8.5-17 g by mouth 2 (two) times daily as needed (constipation). Mix in 8 oz liquid and drink  . zolpidem (AMBIEN) 10 MG tablet Take 5 mg by mouth See admin instructions. Take 1/2 tablet (5 mg) by mouth daily at bedtime, may also take another 1/2 tablet if needed during the night    FAMILY HISTORY:  His indicated that his mother is alive. He indicated that his father is  deceased. He indicated that his sister is alive. He indicated that his brother is alive.    SOCIAL HISTORY: He  reports that he has never smoked. He quit smokeless tobacco use about 4 months ago. His smokeless tobacco use included Snuff. He reports that he does not drink alcohol or use drugs.  REVIEW OF SYSTEMS:   Unable to assess given respiratory distress  and intubation.  SUBJECTIVE:  Very short of breath pre-intubation. Not better w/ BIPAP   VITAL SIGNS: BP 139/88   Pulse 80   Temp 97.8 F (36.6 C)   Resp (!) 41   Ht 5\' 6"  (1.676 m)   Wt 136 lb 9.6 oz (62 kg)   SpO2 92%   BMI 22.05 kg/m   HEMODYNAMICS:    VENTILATOR SETTINGS:    INTAKE / OUTPUT: I/O last 3 completed shifts: In: 6320.4 [P.O.:360; I.V.:5025.4; Blood:335; IV Piggyback:600] Out: 2605 [Urine:2605]  PHYSICAL EXAMINATION: General:  65 year old white male, now sedated on vent  Neuro:  Prior to intubation was awake, oriented, no focal def  HEENT:  Orally intubated. No JVD MMM Cardiovascular:  Tachy rrr no MRG Lungs:  Scattered rhonchi, + accessory use  Abdomen:  Wound vac in place. Multiple abd drains  Musculoskeletal:   Skin:  Warm and dry   LABS:  BMET  Recent Labs Lab 11/19/15 0455 11/20/15 0510 11/21/15 0430  NA 134* 131* 135  K 4.0 3.9 3.4*  CL 99* 98* 100*  CO2 25 26 28   BUN 8 10 11   CREATININE 0.78 0.75 0.77  GLUCOSE 161* 122* 126*    Electrolytes  Recent Labs Lab 11/18/15 0445 11/19/15 0455 11/20/15 0510 11/21/15 0430  CALCIUM 7.6* 7.5* 7.3* 7.2*  MG 1.8  --  1.7 2.0  PHOS 2.0* 2.5 2.5  --     CBC  Recent Labs Lab 11/19/15 0455 11/20/15 0510 11/21/15 0430  WBC 17.3* 15.3* 16.5*  HGB 7.4* 6.9* 6.8*  HCT 23.8* 21.9* 21.8*  PLT 535* 497* 440*    Coag's No results for input(s): APTT, INR in the last 168 hours.  Sepsis Markers No results for input(s): LATICACIDVEN, PROCALCITON, O2SATVEN in the last 168 hours.  ABG  Recent Labs Lab 11/20/15 2040  PHART 7.463*  PCO2ART 36.5  PO2ART 78.0*    Liver Enzymes  Recent Labs Lab 11/15/15 0824 11/17/15 0602 11/20/15 0510  AST 18 12* 18  ALT 12* 11* 11*  ALKPHOS 106 74 113  BILITOT 0.8 0.3 0.5  ALBUMIN 1.9* 1.6* 1.5*    Cardiac Enzymes No results for input(s): TROPONINI, PROBNP in the last 168 hours.  Glucose  Recent Labs Lab 11/20/15 0837  11/20/15 1416 11/20/15 1527 11/20/15 2006 11/20/15 2343 11/21/15 0421  GLUCAP 127* 156* 173* 149* 143* 145*    Imaging Dg Chest Port 1 View  Result Date: 11/20/2015 CLINICAL DATA:  Shortness of breath EXAM: PORTABLE CHEST 1 VIEW COMPARISON:  CT chest dated 11/14/2015 FINDINGS: Patchy opacity in the bilateral lower lungs, likely reflecting compressive atelectasis when correlating with recent CT, although increased from prior chest radiograph. Superimposed left lower lobe pneumonia is not excluded. Small bilateral pleural effusions, increased. Large hiatal hernia. The heart is normal in size. Right arm PICC terminates in the mid SVC. IMPRESSION: Patchy bilateral lower lobe opacities, likely reflecting compressive atelectasis when correlating with recent CT, although increased. Superimposed left lower lobe pneumonia is not excluded. Small bilateral pleural effusions, increased. Large hiatal hernia. Electronically Signed   By: Henderson Newcomer.D.  On: 11/20/2015 20:32     STUDIES:    MICROBIOLOGY: 8/22 BC: neg 8/25 surgical culture: >>>  ANTIBIOTICS: vanc 8/11>>> Zosyn 8/17>>> Fluconazole 8/22>>>  SIGNIFICANT EVENTS:   LINES/TUBES: OETT 8/25>>>  DISCUSSION: This is a 65 year old male who initially underwent initially billroth II w/ partial gastrectomy for giant ulcer back on 8/3. He was re-admitted w/ fever. Ultimately went back to OR 8/25 for SBO and what was found to be perf bowel. Underwent reduction of hiatal hernia and SB resection w/ washout. On arrival in acute hypoxic respiratory failure and failing BIPAP. Will move to the intensive care w/ working dx: of probable evolving ALI superimposed on underlying effusions and atx, further c/b severe sepsis in setting of peritonitis. Will cont full ventilator support, ensure adequate volume resuscitation and continue supportive care.   ASSESSMENT / PLAN:  PULMONARY A: Acute Hypoxic respiratory failure in setting of progressive  bilateral pulmonary infiltrates-->favor a mix of underlying atelectasis and effusions (identified Pre-op) and now possibly evolving ALI Possible Left apical PTX-->although appears as thought is was there previously  P:   Intubate/ventilate F/u abg PAD protocol  Repeat CXR-->if still has PTX would CT scan first prior to Chest tube   CARDIOVASCULAR A:  H/O HTN H/O Hyperlipidemia  P:  Ck lactic acid Tele Transduce PICC Volume as needed Vasoactive gtts for MAP > 65   RENAL A:   Hypokalemia  P:   Replace and recheck   GASTROINTESTINAL A:   S/p repair hiatal hernia repair w/ findings of perforation due to incarcerated efferent limb. He is now s/p partial SB resection as well.  P:   NPO Cont TNA Cont H2 blockade   HEMATOLOGIC A:   Post-op anemia: got 2 units PRBC in PACU P:  F/u CBC ordered; we will trend Hold off on Sunrise heparin   INFECTIOUS A:   Sepsis abd peritonitis now s/p exploration and repair 8/25 P:   Cont abx as outlined by Infectious disease.   ENDOCRINE A:   Mild hyperglycemia  P:   ssi protocol   NEUROLOGIC A:   Post-op pain Sedation on ventilator H/O Anxiety P:   RASS goal: 2 PAD protocol   FAMILY  - Updates: No family at bedside in PACU.  - Inter-disciplinary family meet or Palliative Care meeting due by:  8/31  Erick Colace ACNP-BC [place] Pager # [phone] OR # [phone] if no answer   11/21/2015, 1:09 PM  PCCM Attending Note: Patient seen & examined with nurse practitioner. Please refer to his consult note which I have reviewed in detail. 65 year old male with bowel perforation now s/p resection & ostomy. Intra-operatively patient was hypotensive. He was extubated in PACU and with worsening respiratory status requiring re-intubation. Patient has questionable left apical pneumothorax on my review of his CXR and will need Chest CT evaluation. Patient continuing on broad spectrum antibiotic therapy per ID  recommendations. We will plan to continue to wean ventilator support and perform SBT in AM. Transfused 2u PRBC in PACU for anemia w/ Hgb 6.8. Patient continuing on TPN.  I have spent a total of 31 minutes of critical care time today caring for the patient and reviewing the patient's electronic medical record.  Sonia Baller Ashok Cordia, M.D. [facility] Pager:  [phone] After 3pm or if no response, call [phone] 2:08 PM 11/21/15

## 2015-11-21 NOTE — Progress Notes (Signed)
Pt having trouble w/ comfort and vent compliance Will add diprivan to fent gtt as currently on 300 mcg/hr fent.   Erick Colace ACNP-BC [place] Pager # [phone] OR # [phone] if no answer

## 2015-11-21 NOTE — Progress Notes (Signed)
Nutrition Follow-up  DOCUMENTATION CODES:   Severe malnutrition in context of acute illness/injury  INTERVENTION:    TPN per pharmacy  NUTRITION DIAGNOSIS:   Malnutrition related to acute illness as evidenced by percent weight loss, moderate depletions of muscle mass, moderate depletion of body fat, ongoing  GOAL:   Patient will meet greater than or equal to 90% of their needs, met  MONITOR:   Diet advancement, Vent status, Labs, Weight trends, I & O's  ASSESSMENT:   65 year old male. He is status post an antrectomy with B2 anastomosis on August 3 for bleeding ulcer. He has had a previous colostomy for perforated diverticulitis. He was discharged home approximate 4 days ago. He has had increasing fatigue, abdominal discomfort, nausea, and fever over the past 2 days.   Patient s/p procedures 8/25: REDUCTION OF HIATAL HERNIA REPAIR HIATAL HERNIA RESECTION SMALL BOWEL WITH ANASTAMOSIS PLACEMENT GASTROSTOMY TUBE PLACEMENT DUODENOSTOMY TUBE  Patient is currently intubated on ventilator support MV: 12.9 L/min Temp (24hrs), Avg:98.6 F (37 C), Min:97.8 F (36.6 C), Max:99.8 F (37.7 C)  Patient is receiving TPN with Clinimix E 5/15 @ 80 ml/hr and lipids @ 10 ml/hr.  Provides 1843 kcal and 96 grams protein per day.  Meets 100% minimum estimated energy needs and 100% minimum estimated protein needs.  Diet Order:  TPN (CLINIMIX-E) Adult TPN (CLINIMIX-E) Adult Diet NPO time specified  Skin:  Reviewed, no issues  Last BM:  8/20  Height:   Ht Readings from Last 1 Encounters:  11/14/15 '5\' 6"'  (1.676 m)    Weight:   Wt Readings from Last 1 Encounters:  11/14/15 136 lb 9.6 oz (62 kg)    Ideal Body Weight:  64.5 kg  BMI:  Body mass index is 22.05 kg/m.  Estimated Nutritional Needs:   Kcal:  1778  Protein:  85-100 gm  Fluid:  per MD  EDUCATION NEEDS:   No education needs identified at this time  Arthur Holms, RD, LDN Pager #: [phone] After-Hours  Pager #: [phone]

## 2015-11-21 NOTE — Anesthesia Procedure Notes (Signed)
Procedure Name: Intubation Date/Time: 11/21/2015 1:10 AM Performed by: Oletta Lamas Pre-anesthesia Checklist: Patient identified, Emergency Drugs available, Suction available and Patient being monitored Patient Re-evaluated:Patient Re-evaluated prior to inductionOxygen Delivery Method: Ambu bag Preoxygenation: Pre-oxygenation with 100% oxygen Intubation Type: IV induction, Cricoid Pressure applied and Rapid sequence Ventilation: Mask ventilation without difficulty Laryngoscope Size: Mac and 4 Grade View: Grade II Tube type: Oral Tube size: 7.5 mm Number of attempts: 1 Airway Equipment and Method: Stylet Placement Confirmation: ETT inserted through vocal cords under direct vision,  positive ETCO2 and breath sounds checked- equal and bilateral Secured at: 24 cm Tube secured with: Tape Dental Injury: Teeth and Oropharynx as per pre-operative assessment

## 2015-11-21 NOTE — Progress Notes (Signed)
Pharmacy Antibiotic Note  Miguel [name] is a 65 y.o. male with sepsis.  Pharmacy has been consulted for zosyn and vancomycin dosing. -vancomycin trough= 6 at ~6:30pm. Last dose was at 5:30 this am -current dose vancomycin 750mg  IV q12h  Plan: VT= 6 -No zosyn changes needed -Change vancomycin to 1000mg  IV q8h    Height: 5\' 6"  (167.6 cm) Weight: 136 lb 9.6 oz (62 kg) IBW/kg (Calculated) : 63.8  Temp (24hrs), Avg:98.7 F (37.1 C), Min:97.8 F (36.6 C), Max:99.8 F (37.7 C)   Recent Labs Lab 11/18/15 0445 11/19/15 0455 11/20/15 0510 11/21/15 0430 11/21/15 1835  WBC 19.8* 17.3* 15.3* 16.5* 19.3*  CREATININE 0.82 0.78 0.75 0.77 0.76  LATICACIDVEN  --   --   --   --  1.6  VANCOTROUGH  --   --   --   --  6*    Estimated Creatinine Clearance: 80.7 mL/min (by C-G formula based on SCr of 0.8 mg/dL).    Allergies  Allergen Reactions  . No Known Allergies     Antimicrobials this admission: Zosyn 8/17 >> Vancomycin 8/22 >> Fluconazole 8/22 >>  Dose adjustments this admission: 8/25: VT= 6; change to 1000mg  IV q8h  Microbiology results: Abd tissue 8/25 >> Blood cx 8/22 > ntd Blood cx 8/17 > NF Urine cx 8/17 > ngF  Thank you for allowing pharmacy to be a part of this patient's care.  Hildred Laser, Pharm D 11/21/2015 7:45 PM

## 2015-11-21 NOTE — Care Management Note (Addendum)
Case Management Note  Patient Details  Name: BENITO LEVITIN MRN: [id number] Date of Birth: 04/14/1950  Subjective/Objective:   S/p REDUCTION OF HIATAL HERNIA , REPAIR HIATAL HERNIA, RESECTION SMALL BOWEL WITH ANASTOMOSIS, PLACEMENT GASTROSTOMY TUBE, PLACEMENT DUODENOSTOMY TUBE (N/A)                 Action/Plan:  PTA from home with wife   Pt was set up  with [facility] for PT/RN - however per [facility] pt declined [place] services.  Pt recently discharged with ostomy.   CM will continue to follow for discharge needs   Expected Discharge Date:                  Expected Discharge Plan:  [place]  In-House Referral:     Discharge planning Services  CM Consult  Post Acute Care Choice:    Choice offered to:     DME Arranged:    DME Agency:     HH Arranged:    HH Agency:     Status of Service:  In process, will continue to follow  If discussed at Long Length of Stay Meetings, dates discussed:    Additional Comments:  Maryclare Labrador, RN 11/21/2015, 2:56 PM

## 2015-11-21 NOTE — Anesthesia Postprocedure Evaluation (Signed)
Anesthesia Post Note  Patient: Miguel [name]  Procedure(s) Performed: Procedure(s) (LRB): REDUCTION OF HIATAL HERNIA , REPAIR HIATAL HERNIA, RESECTION SMALL BOWEL WITH ANASTOMOSIS, PLACEMENT GASTROSTOMY TUBE, PLACEMENT DUODENOSTOMY TUBE (N/A)  Patient location during evaluation: SICU Anesthesia Type: General Level of consciousness: sedated Pain management: pain level controlled Vital Signs Assessment: post-procedure vital signs reviewed and stable Respiratory status: patient re-intubated Cardiovascular status: stable Anesthetic complications: yes Anesthetic complication details: Pt tachypneic to 40breaths per minute in pacu with PaO2 of 60 on ABG. Attempted support with BiPap without successful decrease in respiratory effort and still breathing 40bpm. Reintubated in PACU for hypoxic respiratory failure. and required intubation   Last Vitals:  Vitals:   11/21/15 1400 11/21/15 1415  BP:  127/85  Pulse: (!) 123   Resp: (!) 32 (!) 30  Temp:      Last Pain:  Vitals:   11/21/15 1315  TempSrc:   PainSc: 2                  [name]

## 2015-11-22 ENCOUNTER — Inpatient Hospital Stay (HOSPITAL_COMMUNITY): Payer: 59

## 2015-11-22 DIAGNOSIS — J9383 Other pneumothorax: Secondary | ICD-10-CM

## 2015-11-22 DIAGNOSIS — R6521 Severe sepsis with septic shock: Secondary | ICD-10-CM

## 2015-11-22 DIAGNOSIS — Z903 Acquired absence of stomach [part of]: Secondary | ICD-10-CM

## 2015-11-22 LAB — CBC
HCT: 29.2 % — ABNORMAL LOW (ref 39.0–52.0)
Hemoglobin: 9.1 g/dL — ABNORMAL LOW (ref 13.0–17.0)
MCH: 27.4 pg (ref 26.0–34.0)
MCHC: 31.2 g/dL (ref 30.0–36.0)
MCV: 88 fL (ref 78.0–100.0)
PLATELETS: 376 10*3/uL (ref 150–400)
RBC: 3.32 MIL/uL — ABNORMAL LOW (ref 4.22–5.81)
RDW: 17.2 % — ABNORMAL HIGH (ref 11.5–15.5)
WBC: 17.3 10*3/uL — ABNORMAL HIGH (ref 4.0–10.5)

## 2015-11-22 LAB — GLUCOSE, CAPILLARY
GLUCOSE-CAPILLARY: 135 mg/dL — AB (ref 65–99)
GLUCOSE-CAPILLARY: 162 mg/dL — AB (ref 65–99)
Glucose-Capillary: 128 mg/dL — ABNORMAL HIGH (ref 65–99)
Glucose-Capillary: 149 mg/dL — ABNORMAL HIGH (ref 65–99)
Glucose-Capillary: 118 mg/dL — ABNORMAL HIGH (ref 65–99)
Glucose-Capillary: 153 mg/dL — ABNORMAL HIGH (ref 65–99)
Glucose-Capillary: 134 mg/dL — ABNORMAL HIGH (ref 65–99)

## 2015-11-22 LAB — COMPREHENSIVE METABOLIC PANEL
ALT: 15 U/L — ABNORMAL LOW (ref 17–63)
ANION GAP: 6 (ref 5–15)
AST: 22 U/L (ref 15–41)
Albumin: 1.2 g/dL — ABNORMAL LOW (ref 3.5–5.0)
Alkaline Phosphatase: 59 U/L (ref 38–126)
BUN: 15 mg/dL (ref 6–20)
CHLORIDE: 106 mmol/L (ref 101–111)
CO2: 21 mmol/L — AB (ref 22–32)
Calcium: 6.5 mg/dL — ABNORMAL LOW (ref 8.9–10.3)
Creatinine, Ser: 0.8 mg/dL (ref 0.61–1.24)
GFR calc non Af Amer: >60 mL/min (ref >60)
Glucose, Bld: 139 mg/dL — ABNORMAL HIGH (ref 65–99)
POTASSIUM: 4.4 mmol/L (ref 3.5–5.1)
SODIUM: 133 mmol/L — AB (ref 135–145)
Total Bilirubin: 0.7 mg/dL (ref 0.3–1.2)
Total Protein: 3.6 g/dL — ABNORMAL LOW (ref 6.5–8.1)

## 2015-11-22 LAB — POCT I-STAT 3, ART BLOOD GAS (G3+)
Acid-Base Excess: 1 mmol/L (ref 0.0–2.0)
BICARBONATE: 24.4 meq/L — AB (ref 20.0–24.0)
O2 Saturation: 95 %
PCO2 ART: 32.6 mmHg — AB (ref 35.0–45.0)
Patient temperature: 98.5
TCO2: 25 mmol/L (ref 0–100)
pH, Arterial: 7.482 — ABNORMAL HIGH (ref 7.350–7.450)
pO2, Arterial: 67 mmHg — ABNORMAL LOW (ref 80.0–100.0)

## 2015-11-22 LAB — MAGNESIUM: Magnesium: 1.8 mg/dL (ref 1.7–2.4)

## 2015-11-22 LAB — PHOSPHORUS: Phosphorus: 2.9 mg/dL (ref 2.5–4.6)

## 2015-11-22 MED ORDER — FUROSEMIDE 10 MG/ML IJ SOLN
INTRAMUSCULAR | Status: AC
  Filled 2015-11-22: qty 4

## 2015-11-22 MED ORDER — FENTANYL CITRATE (PF) 100 MCG/2ML IJ SOLN
INTRAMUSCULAR | Status: AC
  Filled 2015-11-22: qty 2

## 2015-11-22 MED ORDER — FAT EMULSION 20 % IV EMUL
250.0000 mL | INTRAVENOUS | Status: AC
  Administered 2015-11-22: 250 mL via INTRAVENOUS
  Filled 2015-11-22: qty 250

## 2015-11-22 MED ORDER — MAGNESIUM SULFATE 2 GM/50ML IV SOLN
2.0000 g | Freq: Once | INTRAVENOUS | Status: AC
  Administered 2015-11-22: 2 g via INTRAVENOUS
  Filled 2015-11-22: qty 50

## 2015-11-22 MED ORDER — FUROSEMIDE 10 MG/ML IJ SOLN
40.0000 mg | Freq: Once | INTRAMUSCULAR | Status: AC
Start: 2015-11-22 — End: 2015-11-22
  Administered 2015-11-22: 40 mg via INTRAVENOUS

## 2015-11-22 MED ORDER — SODIUM CHLORIDE 0.9 % IV BOLUS (SEPSIS)
1000.0000 mL | Freq: Once | INTRAVENOUS | Status: AC
  Administered 2015-11-22: 1000 mL via INTRAVENOUS

## 2015-11-22 MED ORDER — TRACE MINERALS CR-CU-MN-SE-ZN 10-1000-500-60 MCG/ML IV SOLN
INTRAVENOUS | Status: DC
  Filled 2015-11-22: qty 1680

## 2015-11-22 MED ORDER — FAT EMULSION 20 % IV EMUL
240.0000 mL | INTRAVENOUS | Status: AC
  Filled 2015-11-22: qty 250

## 2015-11-22 MED ORDER — TRACE MINERALS CR-CU-MN-SE-ZN 10-1000-500-60 MCG/ML IV SOLN
INTRAVENOUS | Status: AC
  Administered 2015-11-22: 17:00:00 via INTRAVENOUS
  Filled 2015-11-22: qty 1920

## 2015-11-22 MED ORDER — FENTANYL CITRATE (PF) 100 MCG/2ML IJ SOLN
25.0000 ug | INTRAMUSCULAR | Status: DC | PRN
  Administered 2015-11-22 – 2015-11-23 (×8): 50 ug via INTRAVENOUS
  Filled 2015-11-22 (×7): qty 2

## 2015-11-22 NOTE — Progress Notes (Signed)
Pt has increased work of breathing, resp rate into the 40s, sats 89-95%. Pt reports difficulty breathing. Upon assessment lung sounds are very wet and crackles. MD paged.  MD ordered 40mg  IV lasix to be give. Will cont to monitor and assess.

## 2015-11-22 NOTE — Progress Notes (Signed)
Progress Note: General Surgery Service   Subjective: No acute events overnight, noted decrease BP with sedation  Objective: Vital signs in last 24 hours: Temp:  [97.8 F (36.6 C)-99 F (37.2 C)] 98 F (36.7 C) (08/26 0753) Pulse Rate:  [1-123] 101 (08/26 0307) Resp:  [12-45] 16 (08/26 0700) BP: (70-152)/(51-92) 123/72 (08/26 0700) SpO2:  [82 %-100 %] 100 % (08/26 0750) Arterial Line BP: (63-166)/(51-121) 123/100 (08/26 0700) FiO2 (%):  [40 %] 40 % (08/26 0750) Last BM Date:  (Colostomy)  Intake/Output from previous day: 08/25 0701 - 08/26 0700 In: 7766.6 [I.V.:4926.6; Blood:670; NG/GT:120; IV Piggyback:2050] Out: [id number] [Urine:1195; Drains:1240; Stool:20; Blood:600] Intake/Output this shift: No intake/output data recorded.  Lungs: coarse b/l  Cardiovascular: tachycardic  Abd: G and D tubes with bilious drainage, JP with serosang drainage  Extremities: no edema  Neuro: alert, GCS 10T  Lab Results: CBC   Recent Labs  11/21/15 1835 11/22/15 0352  WBC 19.3* 17.3*  HGB 10.7* 9.1*  HCT 33.9* 29.2*  PLT 386 376   BMET  Recent Labs  11/21/15 1835 11/22/15 0352  NA 133* 133*  K 4.3 4.4  CL 106 106  CO2 22 21*  GLUCOSE 170* 139*  BUN 14 15  CREATININE 0.76 0.80  CALCIUM 6.5* 6.5*   PT/INR No results for input(s): LABPROT, INR in the last 72 hours. ABG  Recent Labs  11/21/15 1230 11/22/15 0404  PHART 7.371 7.482*  HCO3 20.4 24.4*    Studies/Results:  Anti-infectives: Anti-infectives    Start     Dose/Rate Route Frequency Ordered Stop   11/22/15 0500  vancomycin (VANCOCIN) IVPB 1000 mg/200 mL premix     1,000 mg 200 mL/hr over 60 Minutes Intravenous Every 8 hours 11/21/15 1950     11/21/15 2030  vancomycin (VANCOCIN) IVPB 1000 mg/200 mL premix     1,000 mg 200 mL/hr over 60 Minutes Intravenous STAT 11/21/15 1950 11/22/15 0051   11/21/15 1130  piperacillin-tazobactam (ZOSYN) IVPB 3.375 g  Status:  Discontinued     3.375 g 12.5 mL/hr over 240  Minutes Intravenous To Surgery 11/21/15 1128 11/21/15 1403   11/19/15 0400  vancomycin (VANCOCIN) IVPB 750 mg/150 ml premix  Status:  Discontinued     750 mg 150 mL/hr over 60 Minutes Intravenous Every 12 hours 11/18/15 1459 11/21/15 1950   11/18/15 1600  fluconazole (DIFLUCAN) IVPB 400 mg     400 mg 100 mL/hr over 120 Minutes Intravenous Every 24 hours 11/18/15 1452     11/18/15 1530  vancomycin (VANCOCIN) 1,250 mg in sodium chloride 0.9 % 250 mL IVPB     1,250 mg 166.7 mL/hr over 90 Minutes Intravenous  Once 11/18/15 1452 11/18/15 1940   11/14/15 0600  piperacillin-tazobactam (ZOSYN) IVPB 3.375 g     3.375 g 12.5 mL/hr over 240 Minutes Intravenous Every 8 hours 11/13/15 2328     11/13/15 2300  piperacillin-tazobactam (ZOSYN) IVPB 3.375 g     3.375 g 100 mL/hr over 30 Minutes Intravenous  Once 11/13/15 2257 11/14/15 0217      Medications: Scheduled Meds: . chlorhexidine  15 mL Mouth Rinse BID  . enoxaparin (LOVENOX) injection  40 mg Subcutaneous Q24H  . famotidine (PEPCID) IV  20 mg Intravenous Q12H  . fentaNYL (SUBLIMAZE) injection  50 mcg Intravenous Once  . fluconazole (DIFLUCAN) IV  400 mg Intravenous Q24H  . insulin aspart  0-9 Units Subcutaneous Q4H  . mouth rinse  15 mL Mouth Rinse q12n4p  . piperacillin-tazobactam (ZOSYN)  IV  3.375 g Intravenous Q8H  . sodium chloride flush  10-40 mL Intracatheter Q12H  . vancomycin  1,000 mg Intravenous Q8H   Continuous Infusions: . 0.9 % NaCl with KCl 20 mEq / L 50 mL/hr at 11/22/15 0700  . Marland KitchenTPN (CLINIMIX-E) Adult 80 mL/hr at 11/22/15 0700   And  . fat emulsion 240 mL (11/22/15 0700)  . fentaNYL infusion INTRAVENOUS 100 mcg/hr (11/22/15 0700)  . propofol (DIPRIVAN) infusion 30 mcg/kg/min (11/22/15 0700)   PRN Meds:.acetaminophen, fentaNYL, sodium chloride flush  Assessment/Plan: Patient Active Problem List   Diagnosis Date Noted  . Postoperative fever 11/19/2015  . 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 Severe protein-calorie  malnutrition ([place]) 11/17/2015  . Sepsis ([place]) 11/14/2015  . Hiccups 11/14/2015  . AKI (acute kidney injury) ([place]) 11/14/2015  . Fever   . Leg swelling   . Left shoulder pain   . Muscle spasm of left shoulder   . GI bleed 10/27/2015  . Acute blood loss anemia 10/27/2015  . Syncope 10/27/2015  . Hyperglycemia 10/27/2015  . Hypotension 10/27/2015  . Neck pain 10/27/2015  . Hematemesis 10/27/2015  . Hematochezia 10/27/2015  . Diverticulitis of colon with perforation 07/05/2015   s/p Procedure(s): REDUCTION OF HIATAL HERNIA , REPAIR HIATAL HERNIA, RESECTION SMALL BOWEL WITH ANASTOMOSIS, PLACEMENT GASTROSTOMY TUBE, PLACEMENT DUODENOSTOMY TUBE 11/21/2015 -continue drains -continue TPN -continue antibiotics for intraabdom infection -f/u CC recs, possible extubation (can remove OG tube)   LOS: 8 days   Mickeal Skinner, MD Pg# [phone] [facility]

## 2015-11-22 NOTE — Progress Notes (Signed)
[facility] Progress Note Patient Name: Miguel [name] DOB: 09/06/1950 MRN: [id number]   Date of Service  11/22/2015  HPI/Events of Note  BP low.  eICU Interventions  Will give 1 liter NS IV fluid bolus.      Intervention Category Major Interventions: Other:  Shrihan Putt 11/22/2015, 4:37 AM

## 2015-11-22 NOTE — Procedures (Signed)
Extubation Procedure Note  Patient Details:   Name: Miguel [name] DOB: [birth year] MRN: [id number]   Airway Documentation:  Airway 7.5 mm (Active)  Secured at (cm) 25 cm 11/22/2015  7:50 AM  Measured From Lips 11/22/2015  7:50 AM  Secured Location Right 11/22/2015  7:50 AM  Secured By [employer] 11/22/2015  7:50 AM  Tube Holder Repositioned Yes 11/22/2015  7:50 AM  Cuff Pressure (cm H2O) 26 cm H2O 11/22/2015  7:50 AM  Site Condition Dry 11/21/2015  8:00 PM    Evaluation  O2 sats: stable throughout and currently acceptable Complications: No apparent complications Patient did tolerate procedure well. Bilateral Breath Sounds: Rhonchi   Yes  [name] 11/22/2015, 9:01 AM

## 2015-11-22 NOTE — Evaluation (Signed)
Physical Therapy Evaluation Patient Details Name: Miguel [name] MRN: [id number] DOB: 27-Aug-1950 Today's Date: 11/22/2015   History of Present Illness  Patient is a 65 yo male admitted 11/13/15 with fever, confusion following partial gastrectomy on 10/30/15.  Patient s/p repair of bowel obstruction, found perforation requiring additional resection.  Post-op acute resp failure and intubated 11/20/15.  Extubated 11/22/15.    PMH:  partial gastrectomy 10/30/15, anemia, anxiety, HTN    Clinical Impression  Patient presents with problems listed below.  Will benefit from acute PT to maximize functional independence prior to discharge home with wife.      Follow Up Recommendations Home health PT;Supervision/Assistance - 24 hour    Equipment Recommendations  Rolling walker with 5" wheels;3in1 (PT)    Recommendations for Other Services       Precautions / Restrictions Precautions Precautions: Fall Precaution Comments: Multiple drains Restrictions Weight Bearing Restrictions: No      Mobility  Bed Mobility Overal bed mobility: Needs Assistance;+2 for physical assistance Bed Mobility: Supine to Sit;Sit to Supine     Supine to sit: Max assist;+2 for physical assistance Sit to supine: Max assist;+2 for physical assistance   General bed mobility comments: Verbal cues for technique.  Assist to bring LE's off of bed and to raise trunk to sitting position.  Once upright, patient able to maintain sitting balance with min guard assist.  Patient keeping eyes shut during most of session.  Patient somewhat anxious, with increased respiration rate ranging 27-41.  Worked on pursed-lip breathing and relaxation to slow RR.  Patient fatigued quickly, and noted patient sweating.  Returned to supine with +2 max assist.  Transfers                 General transfer comment: Unable today  Ambulation/Gait                Stairs            Wheelchair Mobility    Modified Rankin (Stroke  Patients Only)       Balance Overall balance assessment: Needs assistance Sitting-balance support: No upper extremity supported;Feet supported Sitting balance-Leahy Scale: Fair                                       Pertinent Vitals/Pain Pain Assessment: 0-10 Pain Score: 7  Pain Location: Abdomen Pain Descriptors / Indicators: Sore;Grimacing;Guarding Pain Intervention(s): Limited activity within patient's tolerance;Monitored during session;Repositioned;Utilized relaxation techniques    Home Living Family/patient expects to be discharged to:: Private residence Living Arrangements: Spouse/significant other Available Help at Discharge: Family;Available 24 hours/day Type of Home: House Home Access: Stairs to enter Entrance Stairs-Rails: None Entrance Stairs-Number of Steps: 1 Home Layout: One level Home Equipment: None      Prior Function Level of Independence: Independent (Prior to initial surgery)         Comments: Active. Drives. No mobility restrictions.     Hand Dominance   Dominant Hand: Right    Extremity/Trunk Assessment   Upper Extremity Assessment: Generalized weakness           Lower Extremity Assessment: Generalized weakness      Cervical / Trunk Assessment: Other exceptions  Communication   Communication: No difficulties  Cognition Arousal/Alertness: Awake/alert Behavior During Therapy: WFL for tasks assessed/performed;Anxious Overall Cognitive Status: Within Functional Limits for tasks assessed  General Comments      Exercises        Assessment/Plan    PT Assessment Patient needs continued PT services  PT Diagnosis Difficulty walking;Generalized weakness;Acute pain   PT Problem List Decreased strength;Decreased activity tolerance;Decreased balance;Decreased mobility;Decreased knowledge of use of DME;Decreased knowledge of precautions;Cardiopulmonary status limiting activity;Pain  PT  Treatment Interventions DME instruction;Gait training;Functional mobility training;Therapeutic activities;Therapeutic exercise;Balance training;Patient/family education   PT Goals (Current goals can be found in the Care Plan section) Acute Rehab PT Goals Patient Stated Goal: Decrease pain PT Goal Formulation: With patient Time For Goal Achievement: 12/06/15 Potential to Achieve Goals: Good    Frequency Min 3X/week   Barriers to discharge        Co-evaluation               End of Session Equipment Utilized During Treatment: Oxygen Activity Tolerance: Patient limited by fatigue;Patient limited by pain Patient left: in bed;with call bell/phone within reach Nurse Communication: Mobility status (Tube leaking - tape curling up around tube.)         Time: [id number] PT Time Calculation (min) (ACUTE ONLY): 21 min   Charges:   PT Evaluation $PT Eval High Complexity: 1 Procedure     PT G CodesDespina Pole 2015-12-05, 4:36 PM Carita Pian. Sanjuana Kava, [place] Pager [phone]

## 2015-11-22 NOTE — Progress Notes (Signed)
Wasted 136ml IV fent from bag. Witnessed by Achille Rich RN.

## 2015-11-22 NOTE — Progress Notes (Addendum)
PARENTERAL NUTRITION CONSULT NOTE - FOLLOW UP  Pharmacy Consult for TPN Indication: Intolerance to enteral feeding  Allergies  Allergen Reactions  . No Known Allergies     Patient Measurements: Height: _0  (167.6 cm) Weight: 136 lb 9.6 oz (62 kg) IBW/kg (Calculated) : 63.8  Vital Signs: Temp: 98.5 F (36.9 C) (08/26 0345) Temp Source: Axillary (08/26 0345) BP: 123/72 (08/26 0700) Pulse Rate: 101 (08/26 0307) Intake/Output from previous day: 08/25 0701 - 08/26 0700 In: 7766.6 [I.V.:4926.6; Blood:670; NG/GT:120; IV Piggyback:2050] Out: 6606 [Urine:1195; Drains:1240; Stool:20; Blood:600] Intake/Output from this shift: No intake/output data recorded.  Labs:  Recent Labs  11/21/15 0430  11/21/15 1230 11/21/15 1835 11/22/15 0352  WBC 16.5*  --   --  19.3* 17.3*  HGB 6.8*  < > 12.2* 10.7* 9.1*  HCT 21.8*  < > 36.0* 33.9* 29.2*  PLT 440*  --   --  386 376  < > = values in this interval not displayed.   Recent Labs  11/20/15 0510 11/21/15 0430  11/21/15 1230 11/21/15 1835 11/22/15 0352  NA 131* 135  < > 133* 133* 133*  K 3.9 3.4*  < > 4.2 4.3 4.4  CL 98* 100*  --   --  106 106  CO2 26 28  --   --  22 21*  GLUCOSE 122* 126*  --   --  170* 139*  BUN 10 11  --   --  14 15  CREATININE 0.75 0.77  --   --  0.76 0.80  CALCIUM 7.3* 7.2*  --   --  6.5* 6.5*  MG 1.7 2.0  --   --   --  1.8  PHOS 2.5  --   --   --   --  2.9  PROT 5.0*  --   --   --   --  3.6*  ALBUMIN 1.5*  --   --   --   --  1.2*  AST 18  --   --   --   --  22  ALT 11*  --   --   --   --  15*  ALKPHOS 113  --   --   --   --  59  BILITOT 0.5  --   --   --   --  0.7  < > = values in this interval not displayed. Estimated Creatinine Clearance: 80.7 mL/min (by C-G formula based on SCr of 0.8 mg/dL).    Recent Labs  11/21/15 1919 11/22/15 0004 11/22/15 0406  GLUCAP 147* 162* 128*    Medications:  Scheduled:  . chlorhexidine  15 mL Mouth Rinse BID  . enoxaparin (LOVENOX) injection  40 mg  Subcutaneous Q24H  . famotidine (PEPCID) IV  20 mg Intravenous Q12H  . fentaNYL (SUBLIMAZE) injection  50 mcg Intravenous Once  . fluconazole (DIFLUCAN) IV  400 mg Intravenous Q24H  . insulin aspart  0-9 Units Subcutaneous Q4H  . mouth rinse  15 mL Mouth Rinse q12n4p  . piperacillin-tazobactam (ZOSYN)  IV  3.375 g Intravenous Q8H  . sodium chloride flush  10-40 mL Intracatheter Q12H  . vancomycin  1,000 mg Intravenous Q8H    Insulin Requirements in the past 24 hours:  7units of sensitive SSI  Current Nutrition: NPO Clinimix E 5/15 at 78m/hr IV lipid emulsions at 14mhr NS + 20K at 5054mr Propofol providing 300 fat calories/ day, at current rate  Nutritional Goals:per RD note 8/25 1778 kCal (will target  1750-1850), 85-100g protein Clinimix E 5/15at 80 ml/hrand 20% lipid emulsion at 10 ml/hr  Admit: 65 y/o male who presented to ED 8/17/2017with fever and N/V admitted with sepsis. He has a recent discharge after repair of a bleeding ulcer and was on TPN that admission. Repeat CT with thickening of the gastrojejunostomy limb of his anastomosis as well as the duodenum, no leak, no increase in size of hiatal hernia, non obstructing distal left ureteral stone. Urology to be consulted. MD reports minimal nutrition over last couple weeks.  Assessment: Transferred to ICU post-op on 8/25 with acute hypoxic respiratory failure, acute lung injury.  Surgeries/Procedures: 8/25:  Partial Small Bowel resection for perforation along incarcerated limb of hernia 8/3: Billroth II repair, partial gastrectomy for giant ulcer 06/2015: perforated sigmoid diverticulitis s/p colostomy   GI: Albumin low at 1.6. Prealbumin down to 4.4. Abd drain 127m.  Pepcid 472min TPN, & Pepcid 2021mV q12 ordered post-op. Endo: no hx DM, CBGs 128-162 Lytes: K 4.4, Mg 1.8, Phos 2.9, CoCa 8.7 Renal: AKI resolved. Cr 0.8, CrCl ~36m31mn. UOP good at 0.8ml/95mhr. NS + 20K @ 50 ml/hr; ureteral stone  should pass w/o intervention per Uro, tamsulosin added Pulm: On vent, poss ptx & may need chest tube.  CT chest ordered for 8/26 Cards: BP low- given NS 1L, tachy Hepatobil: LFTs wnl, tbili wnl, alk phos wnl, TG wnl Neuro: Sedation with Propofol 30mcg24mmin (11.2ml/hr44mFentanyl 100mcg/h27m:  Zosyn + vancomycin for FUO, ?enteritis, no sign intra-abd process.Blood cx repeat 8/22- ntd.  Abd tissue cx 8/25- ntd. Tm Afeb. Fevers perhaps related to bleeding ulcer/surgery per ID, WBC 17.3 Best Practices: enox 40, mc  TPN Access: Double lumen PICC 8/19 TPN start date: 8/20 >>  Plan: Decrease Clinimix E 5/15to 70ml/hr 30mfat emulsion as on Propofol and caloric goals adjusted by RD TPN will provide 1455 kcal and 84g protein, with Propofol providing an additional 300kcal as fat to meet 100% caloric and protein goals Pepcid 40mg, MVI29m TE in TPN D/C Pepcid boluses ContNS + 20K to 50 ml/hr - monitor K+ ContinuesensitiveSSI and adjust as needed TPN labs qMon/Thur  Gracin Soohoo HiaGracy Bruinslinical Pharmacist [facility] ([id number]) Pt [phone] been extubated and Propofol d/c'd.    1-  Change Clinimix-E 5/15 to 80ml/hr 2-46msume Intralipid emulsion 20% at 10ml/hr 3- 8m + Lipids will provide 1843 kcal and 96g protein  Margurite Duffy HiattGracy Bruinsnical Pharmacist [facility]

## 2015-11-22 NOTE — Progress Notes (Addendum)
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 He was re-admitted w/ fever and confusion. Found to have large hiatal hernia containing the gastrojejunostomy limb. Has been treated w/ broad spec abx but ultimately went to OR on 8/25 to reduce hernia and repair bowel obstruction. He was found to have perforation and required additional SB resection. He was initially hypotensive and required volume resuscitation post-op. PCCM asked to see for hypotension and medical management. On PCCM arrival to PACU pt was in acute respiratory distress w/ RR in 40s and no improvement in WOB after application on NIPPV. PO2 in 60s in spite of O2. He was intubated by anesthesia for on-going critical care support.   SUBJECTIVE:  No events overnight, weaning very well.  VITAL SIGNS: BP 123/72   Pulse (!) 101   Temp 98 F (36.7 C) (Oral)   Resp 16   Ht 5\' 6"  (1.676 m)   Wt 62 kg (136 lb 9.6 oz)   SpO2 100%   BMI 22.05 kg/m   HEMODYNAMICS: CVP:  [7 mmHg-8 mmHg] 7 mmHg  VENTILATOR SETTINGS: Vent Mode: PSV;CPAP FiO2 (%):  [40 %] 40 % Set Rate:  [26 bmp] 26 bmp Vt Set:  [460 mL] 460 mL PEEP:  [5 cmH20] 5 cmH20 Pressure Support:  [5 cmH20] 5 cmH20 Plateau Pressure:  [15 cmH20] 15 cmH20  INTAKE / OUTPUT: I/O last 3 completed shifts: In: 9616.6 [P.O.:240; I.V.:6336.6; Blood:670; NG/GT:120; IV Piggyback:2250] Out: 4880 [Urine:2420; Drains:1240; Other:600; Stool:20; Blood:600]  PHYSICAL EXAMINATION: General:  65 year old white male, alert and interactive. Neuro:  Alert and interactive, moving all ext to command.  HEENT:  Orally intubated. No JVD MMM. Cardiovascular:  Tachy rrr  no MRG. Lungs:  Scattered rhonchi, + accessory use. Abdomen:  Wound vac in place. Multiple abd drains. Musculoskeletal:  Moving all ext to command. Skin:  Warm and dry   LABS:  BMET  Recent Labs Lab 11/21/15 0430  11/21/15 1230 11/21/15 1835 11/22/15 0352  NA 135  < > 133* 133* 133*  K 3.4*  < > 4.2 4.3 4.4  CL 100*  --   --  106 106  CO2 28  --   --  22 21*  BUN 11  --   --  14 15  CREATININE 0.77  --   --  0.76 0.80  GLUCOSE 126*  --   --  170* 139*  < > = values in this interval not displayed.  Electrolytes  Recent Labs Lab 11/19/15 0455 11/20/15 0510 11/21/15 0430 11/21/15 1835 11/22/15 0352  CALCIUM 7.5* 7.3* 7.2* 6.5* 6.5*  MG  --  1.7 2.0  --  1.8  PHOS 2.5 2.5  --   --  2.9   CBC  Recent Labs Lab 11/21/15 0430  11/21/15 1230 11/21/15 1835 11/22/15 0352  WBC 16.5*  --   --  19.3* 17.3*  HGB 6.8*  < > 12.2* 10.7* 9.1*  HCT 21.8*  < > 36.0* 33.9* 29.2*  PLT 440*  --   --  386 376  < > = values in this interval not displayed.  Coag's No results for input(s): APTT, INR  in the last 168 hours.  Sepsis Markers  Recent Labs Lab 11/21/15 1835  LATICACIDVEN 1.6   ABG  Recent Labs Lab 11/21/15 1004 11/21/15 1230 11/22/15 0404  PHART 7.366 7.371 7.482*  PCO2ART 43.3 35.2 32.6*  PO2ART 166.0* 60.0* 67.0*   Liver Enzymes  Recent Labs Lab 11/17/15 0602 11/20/15 0510 11/22/15 0352  AST 12* 18 22  ALT 11* 11* 15*  ALKPHOS 74 113 59  BILITOT 0.3 0.5 0.7  ALBUMIN 1.6* 1.5* 1.2*   Cardiac Enzymes No results for input(s): TROPONINI, PROBNP in the last 168 hours.  Glucose  Recent Labs Lab 11/21/15 1247 11/21/15 1603 11/21/15 1919 11/22/15 0004 11/22/15 0406 11/22/15 0748  GLUCAP 217* 192* 147* 162* 128* 149*   Imaging Ct Chest Wo Contrast  Result Date: 11/22/2015 CLINICAL DATA:  Evaluate lungs for infection or fluid. Acute respiratory failure. EXAM: CT CHEST WITHOUT CONTRAST TECHNIQUE: Multidetector CT imaging of the chest was  performed following the standard protocol without IV contrast. COMPARISON:  CT 11/14/2015.  Chest x-ray 11/21/2015. FINDINGS: Cardiovascular: Mild cardiomegaly. Aorta is normal caliber. Scattered aortic arch calcifications. Mediastinum/Nodes: No visible hilar/ axillary or mediastinal adenopathy. Lungs/Pleura: Large bilateral pleural effusions. There is a small left-sided pneumothorax, approximately 10%. Compressive atelectasis noted in the lower lobes bilaterally. Upper Abdomen: Imaging into the upper abdomen shows no acute findings. Postsurgical changes in the upper abdomen. Suspect large hiatal hernia, mainly containing intraperitoneal fat. It is very difficult to visualize due to diffuse edema in the large effusions. NG tube is seen in the stomach. Musculoskeletal: Chest wall soft tissues are unremarkable. No acute bony abnormality or focal bone lesion. IMPRESSION: Large bilateral pleural effusions with atelectasis/ collapse of the lower lobes bilaterally. Small left-sided pneumothorax, approximately 10%. Cardiomegaly. Suspect postoperative changes in the upper abdomen and hiatal hernia, both difficult to visualize due to diffuse edema in the mediastinum and upper abdomen, lack of intravenous contrast, an large bilateral effusions. Electronically Signed   By: Rolm Baptise M.D.   On: 11/22/2015 07:58   Dg Chest Port 1 View  Result Date: 11/21/2015 CLINICAL DATA:  Intubation. EXAM: PORTABLE CHEST 1 VIEW COMPARISON:  11/21/2015 at 12:56 p.m. FINDINGS: New endotracheal tube tip projects 2 cm above the Carina, well positioned. Nasal/ orogastric tube has been adjusted. Tip now lies well within the stomach. Right PICC is stable and well positioned. The left apical pneumothorax in the prior study is not discretely seen on the current exam. Pleural line may be hidden by the third rib. There are no definitive vascular markings at the most superior aspect of the left apex. Bilateral pleural effusions with hazy airspace  lung opacity likely due to a combination of layering pleural fluid and atelectasis, is similar to the earlier study. IMPRESSION: 1. New endotracheal tube is well positioned, tip projecting 2 cm above the Carina. 2. Repositioned naso/orogastric tube is well positioned, well within the stomach. 3. Small left apical pneumothorax seen on the earlier study is not defined on the current exam, but likely remains present with pleural line hidden by the left third rib. It is clearly not increased. 4. No other change. Electronically Signed   By: Lajean Manes M.D.   On: 11/21/2015 13:51   Dg Chest Port 1 View  Result Date: 11/21/2015 CLINICAL DATA:  Dyspnea EXAM: PORTABLE CHEST 1 VIEW COMPARISON:  Chest x-rays dated 11/20/2015, 11/13/2015 and 06/20/2009. FINDINGS: Patchy opacities are again seen at each lung base, similar to yesterday's chest x-ray, compatible with the layering pleural effusions and  bibasilar atelectasis better demonstrated on chest CT of 11/14/2015. Small pneumothorax is now appreciated at the left lung apex. Enteric tube is coiled on itself with tip at the level of the mid mediastinum. Large hiatal hernia was demonstrated on earlier chest CT. Right-sided PICC line appears stable in position with tip at the level of the upper SVC. Cardiomediastinal silhouette is stable in size and configuration. Atherosclerotic changes noted at the aortic arch. IMPRESSION: 1. New small pneumothorax at the left lung apex. 2. Persistent bibasilar opacities, compatible with layering pleural effusions and mild bibasilar atelectasis, similar to yesterday's chest x-ray. 3. Enteric tube, presumably nasogastric tube, coiled on itself at the level of the lower mediastinum/upper abdomen (with tip at the level of the mid mediastinum). This is likely related to the large hiatal hernia demonstrated on earlier chest CT. Recommend advancing if possible. Critical Value/emergent results were called by telephone at the time of  interpretation on 11/21/2015 at 1:12 pm to NP [facility], who verbally acknowledged these results. Electronically Signed   By: Franki Cabot M.D.   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 He was re-admitted w/ fever. Ultimately went back to OR 8/25 for SBO and what was found to be perf bowel. Underwent reduction of hiatal hernia and SB resection w/ washout. On arrival in acute hypoxic respiratory failure and failing BIPAP. Will move to the intensive care w/ working dx: of probable evolving ALI superimposed on underlying effusions and atx, further c/b severe sepsis in setting of peritonitis. Will cont full ventilator support, ensure adequate volume resuscitation and continue supportive care.   ASSESSMENT / PLAN:  PULMONARY A: Acute Hypoxic respiratory failure in setting of progressive bilateral pulmonary infiltrates-->favor a mix of underlying atelectasis and effusions (identified Pre-op) and now possibly evolving ALI Left apical PTX Bilateral large pleural effusion. P:   Extubate IS Titrate O2 for sats Hold off SLP for now, NPO for abdominal reasons. F/u abg D/C sedation PTX noted, will extubate so no need for chest tube KVO IVF, may need diurese if BP allows.  CARDIOVASCULAR A:  H/O HTN H/O Hyperlipidemia  P:  Ck lactic acid Tele Volume as needed D/C pressors.  RENAL A:   Hypokalemia  P:   Replace and recheck  KVO IVF.  GASTROINTESTINAL A:   S/p repair hiatal hernia repair w/ findings of perforation due to incarcerated efferent limb. He is now s/p partial SB resection as well.  P:   NPO Cont TNA Cont H2 blockade  SLP when ok to take PO per  surgery.  HEMATOLOGIC A:   Post-op anemia: got 2 units PRBC in PACU P:  F/u CBC ordered; we will trend Lovenox SCD's  INFECTIOUS A:   Sepsis abd peritonitis now s/p exploration and repair 8/25 P:   Cont abx as outlined by Infectious disease.   ENDOCRINE A:   Mild hyperglycemia  P:   ssi protocol   NEUROLOGIC A:   Post-op pain Sedation on ventilator H/O Anxiety P:   D/C propofol and fentanyl. PRN fentanyl for pain.  FAMILY  - Updates: Patient updated bedside.  - Inter-disciplinary family meet or Palliative Care meeting due by:  8/31  The patient is critically ill  with multiple organ systems failure and requires high complexity decision making for assessment and support, frequent evaluation and titration of therapies, application of advanced monitoring technologies and extensive interpretation of multiple databases.   Critical Care Time devoted to patient care services described in this note is  35  Minutes. This time reflects time of care of this signee Dr Jennet Maduro. This critical care time does not reflect procedure time, or teaching time or supervisory time of PA/NP/Med student/Med Resident etc but could involve care discussion time.  Rush Farmer, M.D. [facility] Pulmonary/Critical Care Medicine. Pager: [phone]. After hours pager: [phone].

## 2015-11-23 DIAGNOSIS — E43 Unspecified severe protein-calorie malnutrition: Secondary | ICD-10-CM

## 2015-11-23 DIAGNOSIS — J810 Acute pulmonary edema: Secondary | ICD-10-CM

## 2015-11-23 DIAGNOSIS — J90 Pleural effusion, not elsewhere classified: Secondary | ICD-10-CM

## 2015-11-23 LAB — GLUCOSE, CAPILLARY
GLUCOSE-CAPILLARY: 151 mg/dL — AB (ref 65–99)
GLUCOSE-CAPILLARY: 143 mg/dL — AB (ref 65–99)
GLUCOSE-CAPILLARY: 144 mg/dL — AB (ref 65–99)
Glucose-Capillary: 128 mg/dL — ABNORMAL HIGH (ref 65–99)
Glucose-Capillary: 139 mg/dL — ABNORMAL HIGH (ref 65–99)
Glucose-Capillary: 140 mg/dL — ABNORMAL HIGH (ref 65–99)

## 2015-11-23 LAB — BASIC METABOLIC PANEL
Anion gap: 5 (ref 5–15)
BUN: 13 mg/dL (ref 6–20)
CALCIUM: 6.9 mg/dL — AB (ref 8.9–10.3)
CO2: 26 mmol/L (ref 22–32)
CREATININE: 0.86 mg/dL (ref 0.61–1.24)
Chloride: 104 mmol/L (ref 101–111)
Glucose, Bld: 141 mg/dL — ABNORMAL HIGH (ref 65–99)
Potassium: 3.7 mmol/L (ref 3.5–5.1)
SODIUM: 135 mmol/L (ref 135–145)

## 2015-11-23 LAB — CULTURE, BLOOD (ROUTINE X 2)
CULTURE: NO GROWTH
Culture: NO GROWTH

## 2015-11-23 LAB — MAGNESIUM: Magnesium: 2 mg/dL (ref 1.7–2.4)

## 2015-11-23 LAB — PHOSPHORUS: Phosphorus: 2.6 mg/dL (ref 2.5–4.6)

## 2015-11-23 LAB — CBC
HCT: 25.7 % — ABNORMAL LOW (ref 39.0–52.0)
Hemoglobin: 8.1 g/dL — ABNORMAL LOW (ref 13.0–17.0)
MCH: 27.7 pg (ref 26.0–34.0)
MCHC: 31.5 g/dL (ref 30.0–36.0)
MCV: 88 fL (ref 78.0–100.0)
PLATELETS: 374 10*3/uL (ref 150–400)
RBC: 2.92 MIL/uL — ABNORMAL LOW (ref 4.22–5.81)
RDW: 17.1 % — AB (ref 11.5–15.5)
WBC: 24.8 10*3/uL — AB (ref 4.0–10.5)

## 2015-11-23 MED ORDER — FAT EMULSION 20 % IV EMUL
240.0000 mL | INTRAVENOUS | Status: AC
  Administered 2015-11-23: 240 mL via INTRAVENOUS
  Filled 2015-11-23: qty 250

## 2015-11-23 MED ORDER — TRACE MINERALS CR-CU-MN-SE-ZN 10-1000-500-60 MCG/ML IV SOLN
INTRAVENOUS | Status: AC
  Administered 2015-11-23: 18:00:00 via INTRAVENOUS
  Filled 2015-11-23: qty 1920

## 2015-11-23 MED ORDER — FENTANYL CITRATE (PF) 100 MCG/2ML IJ SOLN
25.0000 ug | INTRAMUSCULAR | Status: DC | PRN
  Administered 2015-11-23 (×3): 100 ug via INTRAVENOUS
  Administered 2015-11-23: 75 ug via INTRAVENOUS
  Administered 2015-11-24: 100 ug via INTRAVENOUS
  Administered 2015-11-24: 50 ug via INTRAVENOUS
  Administered 2015-11-24 (×3): 100 ug via INTRAVENOUS
  Administered 2015-11-24: 50 ug via INTRAVENOUS
  Administered 2015-11-24 – 2015-11-25 (×5): 100 ug via INTRAVENOUS
  Administered 2015-11-26 – 2015-11-27 (×4): 50 ug via INTRAVENOUS
  Administered 2015-11-27: 100 ug via INTRAVENOUS
  Administered 2015-11-28: 50 ug via INTRAVENOUS
  Administered 2015-11-29: 100 ug via INTRAVENOUS
  Administered 2015-11-29 (×2): 50 ug via INTRAVENOUS
  Administered 2015-11-29: 100 ug via INTRAVENOUS
  Administered 2015-11-30: 50 ug via INTRAVENOUS
  Administered 2015-11-30: 100 ug via INTRAVENOUS
  Administered 2015-11-30 (×2): 50 ug via INTRAVENOUS
  Administered 2015-12-01: 100 ug via INTRAVENOUS
  Administered 2015-12-01: 50 ug via INTRAVENOUS
  Administered 2015-12-01 – 2015-12-03 (×13): 100 ug via INTRAVENOUS
  Filled 2015-11-23 (×44): qty 2

## 2015-11-23 MED ORDER — FUROSEMIDE 10 MG/ML IJ SOLN
40.0000 mg | Freq: Four times a day (QID) | INTRAMUSCULAR | Status: AC
  Administered 2015-11-23 (×3): 40 mg via INTRAVENOUS
  Filled 2015-11-23 (×3): qty 4

## 2015-11-23 MED ORDER — POTASSIUM CHLORIDE 10 MEQ/50ML IV SOLN
10.0000 meq | INTRAVENOUS | Status: AC
  Administered 2015-11-23 (×4): 10 meq via INTRAVENOUS
  Filled 2015-11-23: qty 50

## 2015-11-23 NOTE — Progress Notes (Signed)
PARENTERAL NUTRITION CONSULT NOTE - FOLLOW UP  Pharmacy Consult for TPN Indication: Intolerance to enteral feeding  Allergies  Allergen Reactions  . No Known Allergies     Patient Measurements: Height: '5\' 6"'  (167.6 cm) Weight: 136 lb 9.6 oz (62 kg) IBW/kg (Calculated) : 63.8  Vital Signs: Temp: 99.1 F (37.3 C) (08/27 0745) Temp Source: Oral (08/27 0745) BP: 145/85 (08/27 0700) Intake/Output from previous day: 08/26 0701 - 08/27 0700 In: 3622.3 [I.V.:2642.3; NG/GT:30; IV Piggyback:950] Out: 9169 [Urine:4405; Drains:1072] Intake/Output from this shift: No intake/output data recorded.  Labs:  Recent Labs  11/21/15 1835 11/22/15 0352 11/23/15 0330  WBC 19.3* 17.3* 24.8*  HGB 10.7* 9.1* 8.1*  HCT 33.9* 29.2* 25.7*  PLT 386 376 374     Recent Labs  11/21/15 0430  11/21/15 1835 11/22/15 0352 11/23/15 0330  NA 135  < > 133* 133* 135  K 3.4*  < > 4.3 4.4 3.7  CL 100*  --  106 106 104  CO2 28  --  22 21* 26  GLUCOSE 126*  --  170* 139* 141*  BUN 11  --  '14 15 13  ' CREATININE 0.77  --  0.76 0.80 0.86  CALCIUM 7.2*  --  6.5* 6.5* 6.9*  MG 2.0  --   --  1.8 2.0  PHOS  --   --   --  2.9 2.6  PROT  --   --   --  3.6*  --   ALBUMIN  --   --   --  1.2*  --   AST  --   --   --  22  --   ALT  --   --   --  15*  --   ALKPHOS  --   --   --  59  --   BILITOT  --   --   --  0.7  --   < > = values in this interval not displayed. Estimated Creatinine Clearance: 75.1 mL/min (by C-G formula based on SCr of 0.86 mg/dL).    Recent Labs  11/23/15 0007 11/23/15 0337 11/23/15 0743  GLUCAP 139* 128* 151*    Medications:  Scheduled:  . chlorhexidine  15 mL Mouth Rinse BID  . enoxaparin (LOVENOX) injection  40 mg Subcutaneous Q24H  . fentaNYL (SUBLIMAZE) injection  50 mcg Intravenous Once  . fluconazole (DIFLUCAN) IV  400 mg Intravenous Q24H  . insulin aspart  0-9 Units Subcutaneous Q4H  . mouth rinse  15 mL Mouth Rinse q12n4p  . piperacillin-tazobactam (ZOSYN)  IV   3.375 g Intravenous Q8H  . sodium chloride flush  10-40 mL Intracatheter Q12H  . vancomycin  1,000 mg Intravenous Q8H    Insulin Requirements in the past 24 hours:  7units of sensitive SSI  Current Nutrition: NPO Clinimix E 5/15 at 16m/hr IV lipid emulsions at 144mhr NS + 20K at 2075mr   Nutritional Goals:per RD note 8/25 1778 kCal (will target 1750-1850), 85-100g protein Clinimix E 5/15at 80 ml/hrand 20% lipid emulsion at 10 ml/hr  Admit: [age] male who presented to ED 8/17/2017with fever and N/V admitted with sepsis. He has a recent discharge after repair of a bleeding ulcer and was on TPN that admission. Repeat CT with thickening of the gastrojejunostomy limb of his anastomosis as well as the duodenum, no leak, no increase in size of hiatal hernia, non obstructing distal left ureteral stone. Urology to be consulted. MD reports minimal nutrition over last couple weeks.  Surgeries/Procedures: 8/25: Partial Small Bowel resection for perforation along incarcerated limb of hernia, to ICU post-op on vent 8/3: Billroth II repair, partial gastrectomy for giant ulcer 06/2015: perforated sigmoid diverticulitis s/p colostomy   Assessment GI:  Abd soft.  Colostomy pink.  Abd drain 1040m.  Albumin low at 1.6. Prealbumin down to 4.4.  Pepcid 489min TPN Endo: no hx DM, CBGs 128-153 Lytes: K 3.7, Mg 2, Phos 2.6, CoCa 9 Renal: AKI resolved. Cr 0.86, CrCl ~7576min. UOP 3ml3m.hr. NS + 20K @ 20 ml/hr; ureteral stone should pass w/o intervention per Uro, tamsulosin added Pulm:Remained iIntubated post-op 8/25-8/26, now -4L.  CT chest 8/26 (+) lg B-pleural effusions/atelectasis, sm L-ptx Cards: BP high, tachy Hepatobil: LFTs wnl, tbili wnl, alk phos wnl, TG wnl Neuro: Sedation with Propofol 30mc76m/min (11.2ml/h45m Fentanyl 100mcg/63mD: Vanc/Zosyn/Fluconazole for bowel perf/peritonitis.Blood cx repeat 8/22- ntd.  Abd tissue cx 8/25- ntd. Tm Afeb. WBC 24.8- trending up  again Best Practices: enox 40, mc  TPN Access: Double lumen PICC 8/19 TPN start date: 8/20 >>  Plan: Cont Clinimix E 5/15at 80ml/hr60mralipid emulsion 20% at 10ml/hr 23m+ Lipids will provide 1843 kcal and 96g protein Pepcid 40mg, MVI75m TE inTPN D/C Pepcid boluses ContNS + 20K to 20 ml/hr - monitor K+ ContinuesensitiveSSI and adjust as needed TPN labs qMon/Thur   Nayara Taplin HiaGracy Bruinslinical Pharmacist [facility]

## 2015-11-23 NOTE — Progress Notes (Signed)
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 He was re-admitted w/ fever and confusion. Found to have large hiatal hernia containing the gastrojejunostomy limb. Has been treated w/ broad spec abx but ultimately went to OR on 8/25 to reduce hernia and repair bowel obstruction. He was found to have perforation and required additional SB resection. He was initially hypotensive and required volume resuscitation post-op. PCCM asked to see for hypotension and medical management. On PCCM arrival to PACU pt was in acute respiratory distress w/ RR in 40s and no improvement in WOB after application on NIPPV. PO2 in 60s in spite of O2. He was intubated by anesthesia for on-going critical care support.   SUBJECTIVE:  No events overnight, extubated and doing well.  VITAL SIGNS: BP (!) 145/85   Pulse (!) 101   Temp 99.1 F (37.3 C) (Oral)   Resp (!) 25   Ht 5\' 6"  (1.676 m)   Wt 62 kg (136 lb 9.6 oz)   SpO2 97%   BMI 22.05 kg/m   HEMODYNAMICS: CVP:  [7 mmHg-12 mmHg] 9 mmHg  VENTILATOR SETTINGS:    INTAKE / OUTPUT: I/O last 3 completed shifts: In: 7338.9 [I.V.:4868.9; NG/GT:120; IV Piggyback:2350] Out: TJ:145970; Drains:1672]  PHYSICAL EXAMINATION: General:  65 year old white male, alert and interactive. Neuro:  Alert and interactive, moving all ext to command.  HEENT:  Extubated and doing well, O2 at 4L [place] Cardiovascular:  Tachy rrr no MRG. Lungs:  Scattered rhonchi, + accessory use. Abdomen:  Wound vac in place. Multiple abd drains. Musculoskeletal:  Moving all ext to command. Skin:  Warm and dry   LABS:  BMET  Recent Labs Lab 11/21/15 1835  11/22/15 0352 11/23/15 0330  NA 133* 133* 135  K 4.3 4.4 3.7  CL 106 106 104  CO2 22 21* 26  BUN 14 15 13   CREATININE 0.76 0.80 0.86  GLUCOSE 170* 139* 141*   Electrolytes  Recent Labs Lab 11/20/15 0510 11/21/15 0430 11/21/15 1835 11/22/15 0352 11/23/15 0330  CALCIUM 7.3* 7.2* 6.5* 6.5* 6.9*  MG 1.7 2.0  --  1.8 2.0  PHOS 2.5  --   --  2.9 2.6   CBC  Recent Labs Lab 11/21/15 1835 11/22/15 0352 11/23/15 0330  WBC 19.3* 17.3* 24.8*  HGB 10.7* 9.1* 8.1*  HCT 33.9* 29.2* 25.7*  PLT 386 376 374    Coag's No results for input(s): APTT, INR in the last 168 hours.  Sepsis Markers  Recent Labs Lab 11/21/15 1835  LATICACIDVEN 1.6   ABG  Recent Labs Lab 11/21/15 1004 11/21/15 1230 11/22/15 0404  PHART 7.366 7.371 7.482*  PCO2ART 43.3 35.2 32.6*  PO2ART 166.0* 60.0* 67.0*   Liver Enzymes  Recent Labs Lab 11/17/15 0602 11/20/15 0510 11/22/15 0352  AST 12* 18 22  ALT 11* 11* 15*  ALKPHOS 74 113 59  BILITOT 0.3 0.5 0.7  ALBUMIN 1.6* 1.5* 1.2*   Cardiac Enzymes No results for input(s): TROPONINI, PROBNP in the last 168 hours.  Glucose  Recent Labs Lab 11/22/15 2034 11/22/15 2340 11/22/15 2346 11/23/15 0007 11/23/15 0337 11/23/15 0743  GLUCAP 134* 464* 135* 139* 128*  151*   Imaging No results found. STUDIES:    MICROBIOLOGY: 8/22 BC: neg 8/25 surgical culture: >>>  ANTIBIOTICS: vanc 8/11>>> Zosyn 8/17>>> Fluconazole 8/22>>>  SIGNIFICANT EVENTS:   LINES/TUBES: OETT 8/25>>>8/26 R PICC 8/19>>>  I reviewed chest CT myself, PTX noted and bilateral pleural effusions.  DISCUSSION: This is a 65 year old male who initially underwent initially billroth II w/ partial gastrectomy for giant ulcer back on 8/3. He was re-admitted w/ fever. Ultimately went back to OR 8/25 for SBO and what was found to be perf bowel. Underwent reduction of hiatal hernia and SB resection w/ washout. On arrival in acute hypoxic respiratory failure and failing  BIPAP. Will move to the intensive care w/ working dx: of probable evolving ALI superimposed on underlying effusions and atx, further c/b severe sepsis in setting of peritonitis. Will cont full ventilator support, ensure adequate volume resuscitation and continue supportive care.   ASSESSMENT / PLAN:  PULMONARY A: Acute Hypoxic respiratory failure in setting of progressive bilateral pulmonary infiltrates-->favor a mix of underlying atelectasis and effusions (identified Pre-op) and now possibly evolving ALI Left apical PTX Bilateral large pleural effusion. P:   Diureses as ordered IS Titrate O2 for sats of 88-92% Ambulate Hold off SLP for now, NPO for abdominal reasons. PTX noted, will extubate so no need for chest tube at this time [facility] IVF, may need diurese if BP allows. CXR in AM.  CARDIOVASCULAR A:  H/O HTN H/O Hyperlipidemia  P:  Tele Volume as needed  RENAL A:   Hypokalemia  P:   Replace and recheck  KVO IVF. Lasix 40 mg IV q8 x2 doses. KCl IV 40 meq x1.  GASTROINTESTINAL A:   S/p repair hiatal hernia repair w/ findings of perforation due to incarcerated efferent limb. He is now s/p partial SB resection as well.  P:   NPO Cont TNA Cont H2 blockade  SLP when ok to take PO per surgery.  HEMATOLOGIC A:   Post-op anemia: got 2 units PRBC in PACU P:  F/u CBC ordered; we will trend Lovenox SCD's  INFECTIOUS A:   Sepsis abd peritonitis now s/p exploration and repair 8/25 P:   Cont abx as outlined by Infectious disease.   ENDOCRINE A:   Mild hyperglycemia  P:   ssi protocol   NEUROLOGIC A:   Post-op pain Sedation on ventilator H/O Anxiety P:   D/C propofol and fentanyl. PRN fentanyl for pain.  FAMILY  - Updates: Patient updated bedside.  - Inter-disciplinary family meet or Palliative Care meeting due by:  8/31  Discussed with PCCM-NP and bedside RN.  PCCM signing off, please call back if needed.  Rush Farmer, M.D. [facility]  Pulmonary/Critical Care Medicine. Pager: [phone]. After hours pager: [phone].

## 2015-11-23 NOTE — Progress Notes (Signed)
2 Days Post-Op  Subjective: Doing better, no SOB  Objective: Vital signs in last 24 hours: Temp:  [98.5 F (36.9 C)-99.3 F (37.4 C)] 99.1 F (37.3 C) (08/27 0745) Resp:  [18-37] 25 (08/27 0700) BP: (123-173)/(72-98) 145/85 (08/27 0700) SpO2:  [91 %-100 %] 97 % (08/27 0700) Arterial Line BP: (63-163)/(49-119) 85/63 (08/27 0700) FiO2 (%):  [40 %] 40 % (08/26 0845) Last BM Date:  (Colostomy)  Intake/Output from previous day: 08/26 0701 - 08/27 0700 In: 3622.3 [I.V.:2642.3; NG/GT:30; IV Piggyback:950] Out: [id number] [Urine:4405; Drains:1072] Intake/Output this shift: No intake/output data recorded.  General appearance: cooperative Resp: clear to auscultation bilaterally Cardio: regular rate and rhythm GI: soft, D tube and G tube draining, JP serosang, VAC midline, quiet, colostomy pink  Lab Results:   Recent Labs  11/22/15 0352 11/23/15 0330  WBC 17.3* 24.8*  HGB 9.1* 8.1*  HCT 29.2* 25.7*  PLT 376 374   BMET  Recent Labs  11/22/15 0352 11/23/15 0330  NA 133* 135  K 4.4 3.7  CL 106 104  CO2 21* 26  GLUCOSE 139* 141*  BUN 15 13  CREATININE 0.80 0.86  CALCIUM 6.5* 6.9*   PT/INR No results for input(s): LABPROT, INR in the last 72 hours. ABG  Recent Labs  11/21/15 1230 11/22/15 0404  PHART 7.371 7.482*  HCO3 20.4 24.4*    Studies/Results: Ct Chest Wo Contrast  Result Date: 11/22/2015 CLINICAL DATA:  Evaluate lungs for infection or fluid. Acute respiratory failure. EXAM: CT CHEST WITHOUT CONTRAST TECHNIQUE: Multidetector CT imaging of the chest was performed following the standard protocol without IV contrast. COMPARISON:  CT 11/14/2015.  Chest x-ray 11/21/2015. FINDINGS: Cardiovascular: Mild cardiomegaly. Aorta is normal caliber. Scattered aortic arch calcifications. Mediastinum/Nodes: No visible hilar/ axillary or mediastinal adenopathy. Lungs/Pleura: Large bilateral pleural effusions. There is a small left-sided pneumothorax, approximately 10%. Compressive  atelectasis noted in the lower lobes bilaterally. Upper Abdomen: Imaging into the upper abdomen shows no acute findings. Postsurgical changes in the upper abdomen. Suspect large hiatal hernia, mainly containing intraperitoneal fat. It is very difficult to visualize due to diffuse edema in the large effusions. NG tube is seen in the stomach. Musculoskeletal: Chest wall soft tissues are unremarkable. No acute bony abnormality or focal bone lesion. IMPRESSION: Large bilateral pleural effusions with atelectasis/ collapse of the lower lobes bilaterally. Small left-sided pneumothorax, approximately 10%. Cardiomegaly. Suspect postoperative changes in the upper abdomen and hiatal hernia, both difficult to visualize due to diffuse edema in the mediastinum and upper abdomen, lack of intravenous contrast, an large bilateral effusions. Electronically Signed   By: Rolm Baptise M.D.   On: 11/22/2015 07:58   Dg Chest Port 1 View  Result Date: 11/21/2015 CLINICAL DATA:  Intubation. EXAM: PORTABLE CHEST 1 VIEW COMPARISON:  11/21/2015 at 12:56 p.m. FINDINGS: New endotracheal tube tip projects 2 cm above the Carina, well positioned. Nasal/ orogastric tube has been adjusted. Tip now lies well within the stomach. Right PICC is stable and well positioned. The left apical pneumothorax in the prior study is not discretely seen on the current exam. Pleural line may be hidden by the third rib. There are no definitive vascular markings at the most superior aspect of the left apex. Bilateral pleural effusions with hazy airspace lung opacity likely due to a combination of layering pleural fluid and atelectasis, is similar to the earlier study. IMPRESSION: 1. New endotracheal tube is well positioned, tip projecting 2 cm above the Carina. 2. Repositioned naso/orogastric tube is well positioned, well within  the stomach. 3. Small left apical pneumothorax seen on the earlier study is not defined on the current exam, but likely remains present  with pleural line hidden by the left third rib. It is clearly not increased. 4. No other change. Electronically Signed   By: Lajean Manes M.D.   On: 11/21/2015 13:51   Dg Chest Port 1 View  Result Date: 11/21/2015 CLINICAL DATA:  Dyspnea EXAM: PORTABLE CHEST 1 VIEW COMPARISON:  Chest x-rays dated 11/20/2015, 11/13/2015 and 06/20/2009. FINDINGS: Patchy opacities are again seen at each lung base, similar to yesterday's chest x-ray, compatible with the layering pleural effusions and bibasilar atelectasis better demonstrated on chest CT of 11/14/2015. Small pneumothorax is now appreciated at the left lung apex. Enteric tube is coiled on itself with tip at the level of the mid mediastinum. Large hiatal hernia was demonstrated on earlier chest CT. Right-sided PICC line appears stable in position with tip at the level of the upper SVC. Cardiomediastinal silhouette is stable in size and configuration. Atherosclerotic changes noted at the aortic arch. IMPRESSION: 1. New small pneumothorax at the left lung apex. 2. Persistent bibasilar opacities, compatible with layering pleural effusions and mild bibasilar atelectasis, similar to yesterday's chest x-ray. 3. Enteric tube, presumably nasogastric tube, coiled on itself at the level of the lower mediastinum/upper abdomen (with tip at the level of the mid mediastinum). This is likely related to the large hiatal hernia demonstrated on earlier chest CT. Recommend advancing if possible. Critical Value/emergent results were called by telephone at the time of interpretation on 11/21/2015 at 1:12 pm to NP [facility], who verbally acknowledged these results. Electronically Signed   By: Franki Cabot M.D.   On: 11/21/2015 13:16    Anti-infectives: Anti-infectives    Start     Dose/Rate Route Frequency Ordered Stop   11/22/15 0500  vancomycin (VANCOCIN) IVPB 1000 mg/200 mL premix     1,000 mg 200 mL/hr over 60 Minutes Intravenous Every 8 hours 11/21/15 1950     11/21/15 2030   vancomycin (VANCOCIN) IVPB 1000 mg/200 mL premix     1,000 mg 200 mL/hr over 60 Minutes Intravenous STAT 11/21/15 1950 11/22/15 0051   11/21/15 1130  piperacillin-tazobactam (ZOSYN) IVPB 3.375 g  Status:  Discontinued     3.375 g 12.5 mL/hr over 240 Minutes Intravenous To Surgery 11/21/15 1128 11/21/15 1403   11/19/15 0400  vancomycin (VANCOCIN) IVPB 750 mg/150 ml premix  Status:  Discontinued     750 mg 150 mL/hr over 60 Minutes Intravenous Every 12 hours 11/18/15 1459 11/21/15 1950   11/18/15 1600  fluconazole (DIFLUCAN) IVPB 400 mg     400 mg 100 mL/hr over 120 Minutes Intravenous Every 24 hours 11/18/15 1452     11/18/15 1530  vancomycin (VANCOCIN) 1,250 mg in sodium chloride 0.9 % 250 mL IVPB     1,250 mg 166.7 mL/hr over 90 Minutes Intravenous  Once 11/18/15 1452 11/18/15 1940   11/14/15 0600  piperacillin-tazobactam (ZOSYN) IVPB 3.375 g     3.375 g 12.5 mL/hr over 240 Minutes Intravenous Every 8 hours 11/13/15 2328     11/13/15 2300  piperacillin-tazobactam (ZOSYN) IVPB 3.375 g     3.375 g 100 mL/hr over 30 Minutes Intravenous  Once 11/13/15 2257 11/14/15 0217      Assessment/Plan: s/p Procedure(s): REDUCTION OF HIATAL HERNIA , REPAIR HIATAL HERNIA, RESECTION SMALL BOWEL WITH ANASTOMOSIS, PLACEMENT GASTROSTOMY TUBE, PLACEMENT DUODENOSTOMY TUBE (N/A) POD2 D/C art line Await bowel function Vanc/Zosyn/Diflucan for bowel perforation/peritonitis, WBC remains  elevated Protein calorie malnutrition - TNA Anemia VTE - Lovenox  LOS: 9 days    Jaliana Medellin E 11/23/2015

## 2015-11-24 ENCOUNTER — Encounter (HOSPITAL_COMMUNITY): Payer: Self-pay | Admitting: General Surgery

## 2015-11-24 DIAGNOSIS — N179 Acute kidney failure, unspecified: Secondary | ICD-10-CM | POA: Diagnosis not present

## 2015-11-24 DIAGNOSIS — B9562 Methicillin resistant Staphylococcus aureus infection as the cause of diseases classified elsewhere: Secondary | ICD-10-CM

## 2015-11-24 DIAGNOSIS — R6520 Severe sepsis without septic shock: Secondary | ICD-10-CM | POA: Diagnosis not present

## 2015-11-24 DIAGNOSIS — A419 Sepsis, unspecified organism: Secondary | ICD-10-CM | POA: Diagnosis not present

## 2015-11-24 DIAGNOSIS — K440 Diaphragmatic hernia with obstruction, without gangrene: Secondary | ICD-10-CM | POA: Diagnosis not present

## 2015-11-24 DIAGNOSIS — E871 Hypo-osmolality and hyponatremia: Secondary | ICD-10-CM | POA: Diagnosis not present

## 2015-11-24 DIAGNOSIS — R188 Other ascites: Secondary | ICD-10-CM | POA: Diagnosis not present

## 2015-11-24 DIAGNOSIS — J9601 Acute respiratory failure with hypoxia: Secondary | ICD-10-CM | POA: Diagnosis not present

## 2015-11-24 DIAGNOSIS — J90 Pleural effusion, not elsewhere classified: Secondary | ICD-10-CM | POA: Diagnosis not present

## 2015-11-24 DIAGNOSIS — E43 Unspecified severe protein-calorie malnutrition: Secondary | ICD-10-CM | POA: Diagnosis not present

## 2015-11-24 LAB — COMPREHENSIVE METABOLIC PANEL
ALBUMIN: 1.3 g/dL — AB (ref 3.5–5.0)
ALT: 15 U/L — AB (ref 17–63)
AST: 18 U/L (ref 15–41)
Alkaline Phosphatase: 106 U/L (ref 38–126)
Anion gap: 11 (ref 5–15)
BUN: 13 mg/dL (ref 6–20)
CHLORIDE: 94 mmol/L — AB (ref 101–111)
CO2: 30 mmol/L (ref 22–32)
CREATININE: 0.9 mg/dL (ref 0.61–1.24)
Calcium: 7.1 mg/dL — ABNORMAL LOW (ref 8.9–10.3)
GFR calc Af Amer: >60 mL/min (ref >60)
GFR calc non Af Amer: >60 mL/min (ref >60)
GLUCOSE: 132 mg/dL — AB (ref 65–99)
POTASSIUM: 3 mmol/L — AB (ref 3.5–5.1)
SODIUM: 135 mmol/L (ref 135–145)
Total Bilirubin: 0.9 mg/dL (ref 0.3–1.2)
Total Protein: 4.9 g/dL — ABNORMAL LOW (ref 6.5–8.1)

## 2015-11-24 LAB — CBC
HCT: 25.3 % — ABNORMAL LOW (ref 39.0–52.0)
HEMOGLOBIN: 8.1 g/dL — AB (ref 13.0–17.0)
MCH: 27.7 pg (ref 26.0–34.0)
MCHC: 32 g/dL (ref 30.0–36.0)
MCV: 86.6 fL (ref 78.0–100.0)
Platelets: 413 10*3/uL — ABNORMAL HIGH (ref 150–400)
RBC: 2.92 MIL/uL — ABNORMAL LOW (ref 4.22–5.81)
RDW: 16.7 % — AB (ref 11.5–15.5)
WBC: 23.2 10*3/uL — ABNORMAL HIGH (ref 4.0–10.5)

## 2015-11-24 LAB — TYPE AND SCREEN
ABO/RH(D): A POS
ANTIBODY SCREEN: NEGATIVE
UNIT DIVISION: 0
UNIT DIVISION: 0
UNIT DIVISION: 0
Unit division: 0
Unit division: 0

## 2015-11-24 LAB — DIFFERENTIAL
Basophils Absolute: 0 10*3/uL (ref 0.0–0.1)
Basophils Relative: 0 %
EOS PCT: 2 %
Eosinophils Absolute: 0.5 10*3/uL (ref 0.0–0.7)
Lymphocytes Relative: 5 %
Lymphs Abs: 1.2 10*3/uL (ref 0.7–4.0)
MONOS PCT: 8 %
Monocytes Absolute: 1.9 10*3/uL — ABNORMAL HIGH (ref 0.1–1.0)
Neutro Abs: 19.6 10*3/uL — ABNORMAL HIGH (ref 1.7–7.7)
Neutrophils Relative %: 85 %

## 2015-11-24 LAB — MAGNESIUM: Magnesium: 1.7 mg/dL (ref 1.7–2.4)

## 2015-11-24 LAB — GLUCOSE, CAPILLARY
GLUCOSE-CAPILLARY: 150 mg/dL — AB (ref 65–99)
GLUCOSE-CAPILLARY: 464 mg/dL — AB (ref 65–99)
Glucose-Capillary: 129 mg/dL — ABNORMAL HIGH (ref 65–99)
Glucose-Capillary: 145 mg/dL — ABNORMAL HIGH (ref 65–99)

## 2015-11-24 LAB — PHOSPHORUS: Phosphorus: 3.4 mg/dL (ref 2.5–4.6)

## 2015-11-24 LAB — TRIGLYCERIDES: TRIGLYCERIDES: 98 mg/dL (ref <150)

## 2015-11-24 LAB — PREALBUMIN: PREALBUMIN: 5 mg/dL — AB (ref 18–38)

## 2015-11-24 LAB — PATHOLOGIST SMEAR REVIEW

## 2015-11-24 MED ORDER — MAGNESIUM SULFATE 2 GM/50ML IV SOLN
2.0000 g | Freq: Once | INTRAVENOUS | Status: AC
  Administered 2015-11-24: 2 g via INTRAVENOUS
  Filled 2015-11-24: qty 50

## 2015-11-24 MED ORDER — FAT EMULSION 20 % IV EMUL
240.0000 mL | INTRAVENOUS | Status: AC
  Administered 2015-11-24: 240 mL via INTRAVENOUS
  Filled 2015-11-24: qty 250

## 2015-11-24 MED ORDER — POTASSIUM CHLORIDE 10 MEQ/50ML IV SOLN
10.0000 meq | INTRAVENOUS | Status: AC
  Administered 2015-11-24 (×4): 10 meq via INTRAVENOUS
  Filled 2015-11-24 (×3): qty 50

## 2015-11-24 MED ORDER — POTASSIUM CHLORIDE 10 MEQ/50ML IV SOLN
10.0000 meq | INTRAVENOUS | Status: AC
  Administered 2015-11-24 (×2): 10 meq via INTRAVENOUS
  Filled 2015-11-24 (×2): qty 50

## 2015-11-24 MED ORDER — TRACE MINERALS CR-CU-MN-SE-ZN 10-1000-500-60 MCG/ML IV SOLN
INTRAVENOUS | Status: AC
  Administered 2015-11-24: 18:00:00 via INTRAVENOUS
  Filled 2015-11-24: qty 1920

## 2015-11-24 NOTE — Progress Notes (Signed)
3 Days Post-Op  Subjective: Pt doing well this AM, sitting in chair.  Ambulated in hall this AM  Objective: Vital signs in last 24 hours: Temp:  [98.2 F (36.8 C)-99.9 F (37.7 C)] 98.7 F (37.1 C) (08/28 0400) Resp:  [18-32] 18 (08/28 0700) BP: (124-161)/(70-93) 127/77 (08/28 0700) SpO2:  [94 %-99 %] 99 % (08/28 0700) Arterial Line BP: (111)/(75) 111/75 (08/27 0900) Last BM Date:  (Ostomy )  Intake/Output from previous day: 08/27 0701 - 08/28 0700 In: 3188.5 [I.V.:1938.5; IV Piggyback:1250] Out: 9877 [Urine:9325; Drains:552] Intake/Output this shift: Total I/O In: 50 [IV Piggyback:50] Out: 475 [Urine:170; Drains:305]  General appearance: alert and cooperative Cardio: regular rate and rhythm, S1, S2 normal, no murmur, click, rub or gallop GI: soft, wound vac in place, D-tube bilous, G-tube bilious, ostomy with min output  Lab Results:   Recent Labs  11/23/15 0330 11/24/15 0410  WBC 24.8* 23.2*  HGB 8.1* 8.1*  HCT 25.7* 25.3*  PLT 374 413*   BMET  Recent Labs  11/23/15 0330 11/24/15 0410  NA 135 135  K 3.7 3.0*  CL 104 94*  CO2 26 30  GLUCOSE 141* 132*  BUN 13 13  CREATININE 0.86 0.90  CALCIUM 6.9* 7.1*   PT/INR No results for input(s): LABPROT, INR in the last 72 hours. ABG  Recent Labs  11/21/15 1230 11/22/15 0404  PHART 7.371 7.482*  HCO3 20.4 24.4*    Studies/Results: No results found.  Anti-infectives: Anti-infectives    Start     Dose/Rate Route Frequency Ordered Stop   11/22/15 0500  vancomycin (VANCOCIN) IVPB 1000 mg/200 mL premix     1,000 mg 200 mL/hr over 60 Minutes Intravenous Every 8 hours 11/21/15 1950     11/21/15 2030  vancomycin (VANCOCIN) IVPB 1000 mg/200 mL premix     1,000 mg 200 mL/hr over 60 Minutes Intravenous STAT 11/21/15 1950 11/22/15 0051   11/21/15 1130  piperacillin-tazobactam (ZOSYN) IVPB 3.375 g  Status:  Discontinued     3.375 g 12.5 mL/hr over 240 Minutes Intravenous To Surgery 11/21/15 1128 11/21/15 1403    11/19/15 0400  vancomycin (VANCOCIN) IVPB 750 mg/150 ml premix  Status:  Discontinued     750 mg 150 mL/hr over 60 Minutes Intravenous Every 12 hours 11/18/15 1459 11/21/15 1950   11/18/15 1600  fluconazole (DIFLUCAN) IVPB 400 mg     400 mg 100 mL/hr over 120 Minutes Intravenous Every 24 hours 11/18/15 1452     11/18/15 1530  vancomycin (VANCOCIN) 1,250 mg in sodium chloride 0.9 % 250 mL IVPB     1,250 mg 166.7 mL/hr over 90 Minutes Intravenous  Once 11/18/15 1452 11/18/15 1940   11/14/15 0600  piperacillin-tazobactam (ZOSYN) IVPB 3.375 g     3.375 g 12.5 mL/hr over 240 Minutes Intravenous Every 8 hours 11/13/15 2328     11/13/15 2300  piperacillin-tazobactam (ZOSYN) IVPB 3.375 g     3.375 g 100 mL/hr over 30 Minutes Intravenous  Once 11/13/15 2257 11/14/15 0217      Assessment/Plan: s/p Procedure(s): REDUCTION OF HIATAL HERNIA , REPAIR HIATAL HERNIA, RESECTION SMALL BOWEL WITH ANASTOMOSIS, PLACEMENT GASTROSTOMY TUBE, PLACEMENT DUODENOSTOMY TUBE (N/A)   DC foley con't drains to gravity Mobilize as tol Await bowel function before started PO    LOS: 10 days    Rosario Jacks., Anne Hahn 11/24/2015

## 2015-11-24 NOTE — Progress Notes (Signed)
PARENTERAL NUTRITION CONSULT NOTE - FOLLOW UP  Pharmacy Consult:  TPN Indication:  Intolerance to enteral feeding  Allergies  Allergen Reactions  . No Known Allergies    Patient Measurements: Height: 5\' 6"  (167.6 cm) Weight: 136 lb 9.6 oz (62 kg) IBW/kg (Calculated) : 63.8  Vital Signs: Temp: 98.7 F (37.1 C) (08/28 0400) Temp Source: Oral (08/28 0400) BP: 127/77 (08/28 0700) Intake/Output from previous day: 08/27 0701 - 08/28 0700 In: 3188.5 [I.V.:1938.5; IV Piggyback:1250] Out: [id number] [Urine:9325; Drains:552] Intake/Output from this shift: Total I/O In: 50 [IV Piggyback:50] Out: 475 [Urine:170; Drains:305]  Labs:  Recent Labs  11/22/15 0352 11/23/15 0330 11/24/15 0410  WBC 17.3* 24.8* 23.2*  HGB 9.1* 8.1* 8.1*  HCT 29.2* 25.7* 25.3*  PLT 376 374 413*     Recent Labs  11/22/15 0352 11/23/15 0330 11/24/15 0410  NA 133* 135 135  K 4.4 3.7 3.0*  CL 106 104 94*  CO2 21* 26 30  GLUCOSE 139* 141* 132*  BUN 15 13 13   CREATININE 0.80 0.86 0.90  CALCIUM 6.5* 6.9* 7.1*  MG 1.8 2.0 1.7  PHOS 2.9 2.6 3.4  PROT 3.6*  --  4.9*  ALBUMIN 1.2*  --  1.3*  AST 22  --  18  ALT 15*  --  15*  ALKPHOS 59  --  106  BILITOT 0.7  --  0.9  PREALBUMIN  --   --  5.0*  TRIG  --   --  98   Estimated Creatinine Clearance: 71.8 mL/min (by C-G formula based on SCr of 0.9 mg/dL).    Recent Labs  11/23/15 1940 11/24/15 0004 11/24/15 0414  GLUCAP 144* 150* 129*     Insulin Requirements in the past 24 hours:  6units sensitive SSI  Assessment [name] presented on 8/17/2017with fever, nausea and vomiting.  He was recently discharged after repair of a bleeding ulcer and was on TPN during that admission.  Repeat CT showed thickening of the gastrojejunostomy limb of his anastomosis as well as the duodenum.  MD reports minimal nutrition over the last couple of weeks and Pharmacy consulted to resume TPN.    Surgeries/Procedures: 06/2015 - perforated sigmoid diverticulitis s/p  colostomy  8/25 - partial small bowel resection for perforation along incarcerated limb of hernia 8/3 - Billroth II repair, partial gastrectomy for giant ulcer  GI: Pepcid in TPN.  Prealbumin low at 5.  Drain O/P 552 mL Endo: no hx DM - CBGs controlled Lytes: K+ 3 (Lasix x3 doses yesterday, 4 runs ordered this AM), Mag 1.7 (2gm given), low CL, others WNL Renal: ureteral stone should pass w/o intervention per Urology - SCr 0.9, CrCL 72 ml/min, good UOP 6.3 ml/kg/hr, NS 20K at 20 ml/hr, Flomax Pulm: extubated to 3L [place] Cards: VSS, CVP 3 Hepatobil: LFTs / tbili /  TG WNL Neuro: off sedation, pain score 0-5 on PRN Fentanyl ID: Vanc/Zosyn/Fluc for bowel perf/peritonitis - afebrile, WBC down 23.2, cultures NGTD Best Practices: Lovenox, MC TPN Access: double lumen PICC 11/15/15 TPN start date: 11/16/15  Current Nutrition: TPN  Nutritional Goals: 1778 kCal (will target 1750-1850), 85-100g protein per day   Plan: - Continue Clinimix E 5/15 at 80 ml/hr and lipids at 10 ml/hr.  TPN provides 1843 kCal and 96gm of protein daily, meeting 100% of patient's needs. - Pepcid 40mg  daily in TPN - Daily multivitamin and trace elements - D/C SSI/CBG checks - KCL x 2 additional runs - F/U AM labs   Lakyn Alsteen D. Mina Marble, PharmD,  BCPS Pager:  [phone] 11/24/2015, 8:23 AM

## 2015-11-24 NOTE — Progress Notes (Signed)
Physical Therapy Treatment Patient Details Name: Miguel [name] MRN: [id number] DOB: 04-08-1950 Today's Date: 11/24/2015    History of Present Illness Patient is a 65 yo male admitted 11/13/15 with fever, confusion following partial gastrectomy on 10/30/15.  Patient s/p repair of bowel obstruction, found perforation requiring additional resection.  Post-op acute resp failure and intubated 11/20/15.  Extubated 11/22/15.    PMH:  partial gastrectomy 10/30/15, anemia, anxiety, HTN    PT Comments    Pt pleasant and willing to mobilize even requesting increased ambulation distance. Pt educated for transfers, gait and lines and anticipate pt will progress well now that he has returned to walking. Will continue to follow and recommend daily mobility with nursing staff.   sats 93-95% on RA HR 90 BP 138/82  Follow Up Recommendations  Home health PT;Supervision/Assistance - 24 hour     Equipment Recommendations  Rolling walker with 5" wheels;3in1 (PT)    Recommendations for Other Services       Precautions / Restrictions Precautions Precautions: Fall Precaution Comments: 4 drains, wound VAC    Mobility  Bed Mobility Overal bed mobility: Needs Assistance Bed Mobility: Sit to Supine       Sit to supine: Min assist   General bed mobility comments: assist to bring legs onto surface, cues for sequence and technique  Transfers Overall transfer level: Needs assistance   Transfers: Sit to/from Stand Sit to Stand: Min guard         General transfer comment: cues for hand placement and safety with assist to manage lines  Ambulation/Gait Ambulation/Gait assistance: Min assist Ambulation Distance (Feet): 300 Feet Assistive device:  (pushing WC) Gait Pattern/deviations: Step-through pattern;Decreased stride length;Trunk flexed   Gait velocity interpretation: Below normal speed for age/gender General Gait Details: cues for posture, chair to follow but not needed, cues for breathing  technique   Stairs            Wheelchair Mobility    Modified Rankin (Stroke Patients Only)       Balance Overall balance assessment: Needs assistance   Sitting balance-Leahy Scale: Good       Standing balance-Leahy Scale: Fair                      Cognition Arousal/Alertness: Awake/alert Behavior During Therapy: WFL for tasks assessed/performed Overall Cognitive Status: Within Functional Limits for tasks assessed                      Exercises      General Comments        Pertinent Vitals/Pain Pain Score: 5  Pain Location: abdomen Pain Descriptors / Indicators: Sore Pain Intervention(s): Limited activity within patient's tolerance;Monitored during session;RN gave pain meds during session;Repositioned    Home Living                      Prior Function            PT Goals (current goals can now be found in the care plan section) Progress towards PT goals: Goals met and updated - see care plan    Frequency       PT Plan Current plan remains appropriate    Co-evaluation             End of Session   Activity Tolerance: Patient tolerated treatment well Patient left: in bed;with call bell/phone within reach;with family/visitor present     Time: 1145-1210 PT Time Calculation (min) (  ACUTE ONLY): 25 min  Charges:  $Gait Training: 8-22 mins $Therapeutic Activity: 8-22 mins                    G Codes:      Melford Aase 12/09/2015, 12:26 PM Elwyn Reach, [place]

## 2015-11-24 NOTE — Consult Note (Addendum)
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  He and his family state they have been independent with pouch application and emptying prior to admission and deny any problems. Stomal assessment/size: Stoma red and viable; 11/2 inches, raised above skin level.  Peristoma hernia visible around peristomal area. Output: Mod amt liquid brown stool Ostomy pouching: 2pc.  Education provided: Applied 2 piece pouching system and extra supplies ordered to the bedside for patient/staff nurse use.  [place] Nurse wound consult note Reason for Consult: Consult requested for Vac dressing assistance. PA at bedside to assess wound during first post-op dressing change Wound type: Full thickness post-op wound to midline abd Measurement:20X2.5X.3cm Wound bed: beefy red Drainage (amount, consistency, odor) scant amt pink drainage, no odor Periwound: Intact skin surrounding Dressing procedure/placement/frequency: PA  Discontinued the Vac order and moist fluffed gauze was applied. Refer to their team for further plan of care. Please re-consult if further assistance is needed.  Thank-you,  Julien Girt MSN, [place], [place], [place], [place]

## 2015-11-24 NOTE — Progress Notes (Signed)
[place] Progress Note Patient Name: Miguel [name] DOB: Jul 15, 1950 MRN: [id number]   Date of Service  11/24/2015  HPI/Events of Note  K+ = 3.0 , Mg++ = 1.7 and Creatinine = 0.90.  eICU Interventions  Will replace K+ and Mg++.     Intervention Category Intermediate Interventions: Electrolyte abnormality - evaluation and management  Sommer,Steven Eugene 11/24/2015, 6:11 AM

## 2015-11-24 NOTE — Progress Notes (Signed)
Potassium of 3.0 reported to [facility] at e-link and made aware that the patient has diuresed over 9 L in the last 24 hours.

## 2015-11-24 NOTE — Progress Notes (Signed)
Pharmacy Antibiotic Note  Miguel [name] is a 65 y.o. male with sepsis.  Pharmacy has been consulted for zosyn and vancomycin dosing.  Continues on Zosyn for sepsis & fluconazole added for ?enteritis/FUO, wound clean per CCS.  ID stopping vancomycin today. ID feels may be related to recent ulcer/surgery.  S/P hernia repair 8/25 & tissue cx taken. Afebrile, WBC elevated at 23.2. SCr stable, CrCl ~69mlmin.   Plan: Continue Zosyn 3.375 gm IV q8h (4 hour infusion) Continue fluconazole 400mg  IV q24 Monitor clinical picture, renal function, VT prn F/U C&S, abx deescalation / LOT   Height: 5\' 6"  (167.6 cm) Weight: 136 lb 9.6 oz (62 kg) IBW/kg (Calculated) : 63.8  Temp (24hrs), Avg:99.1 F (37.3 C), Min:98.2 F (36.8 C), Max:99.9 F (37.7 C)   Recent Labs Lab 11/21/15 0430 11/21/15 1835 11/22/15 0352 11/23/15 0330 11/24/15 0410  WBC 16.5* 19.3* 17.3* 24.8* 23.2*  CREATININE 0.77 0.76 0.80 0.86 0.90  LATICACIDVEN  --  1.6  --   --   --   VANCOTROUGH  --  6*  --   --   --     Estimated Creatinine Clearance: 71.8 mL/min (by C-G formula based on SCr of 0.9 mg/dL).    Allergies  Allergen Reactions  . No Known Allergies     Antimicrobials this admission: Zosyn 8/17 >> Vancomycin 8/22 >> 8/28 Fluconazole 8/22 >>  Dose adjustments this admission: 8/25: VT= 6; change to 1000mg  IV q8h  Microbiology results: Abd tissue 8/25 >> ngtd Blood cx 8/22 > ngF Blood cx 8/17 > ngF Urine cx 8/17 > ngF  Thank you for allowing pharmacy to be a part of this patient's care.  Elenor Quinones, PharmD, BCPS Clinical Pharmacist Pager [phone] 11/24/2015 10:31 AM

## 2015-11-24 NOTE — Progress Notes (Signed)
Patient ID: Miguel [name], male   DOB: [birth year], 65 y.o.   MRN: [id number]          [facility] for Infectious Disease    Date of Admission:  11/13/2015   Total days of antibiotics 31        Day 11 piperacillin tazobactam        Day 7 vancomycin        Day 7 fluconazole        Postop day 3  Principal Problem:   Postoperative fever Active Problems:   S/P partial gastrectomy   Hyperglycemia   Hiccups   AKI (acute kidney injury) ([place])   Severe protein-calorie malnutrition ([place])   . chlorhexidine  15 mL Mouth Rinse BID  . enoxaparin (LOVENOX) injection  40 mg Subcutaneous Q24H  . fentaNYL (SUBLIMAZE) injection  50 mcg Intravenous Once  . fluconazole (DIFLUCAN) IV  400 mg Intravenous Q24H  . mouth rinse  15 mL Mouth Rinse q12n4p  . piperacillin-tazobactam (ZOSYN)  IV  3.375 g Intravenous Q8H  . potassium chloride  10 mEq Intravenous Q1 Hr x 4  . potassium chloride  10 mEq Intravenous Q1 Hr x 2  . sodium chloride flush  10-40 mL Intracatheter Q12H  . vancomycin  1,000 mg Intravenous Q8H    SUBJECTIVE: He is feeling much better.  Review of Systems: Review of Systems  Constitutional: Negative for chills, diaphoresis and fever.  Respiratory: Negative for cough.   Gastrointestinal: Negative for abdominal pain, diarrhea, nausea and vomiting.    Past Medical History:  Diagnosis Date  . Anemia   . Anxiety   . Gastritis   . GI bleed due to NSAIDs 10/27/2015  . Hyperlipidemia   . Hypertension   . Kidney stones   . 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 Sigmoid diverticulitis    with perforation    Social History  Substance Use Topics  . Smoking status: Never Smoker  . Smokeless tobacco: Former Systems developer    Types: Snuff    Quit date: 07/14/2015  . Alcohol use No    Family History  Problem Relation Age of Onset  . Stroke Mother   . Stroke Brother   . Heart disease Brother    Allergies  Allergen Reactions  . No Known Allergies     OBJECTIVE: Vitals:     11/24/15 0500 11/24/15 0600 11/24/15 0700 11/24/15 0800  BP: 126/70 132/75 127/77   Pulse:      Resp: 19 (!) 21 18   Temp:    99.1 F (37.3 C)  TempSrc:    Oral  SpO2: 97% 96% 99%   Weight:      Height:       Body mass index is 22.05 kg/m.  Physical Exam  Constitutional:  He is looking better. He is smiling and sitting up in a chair.  Cardiovascular: Normal rate and regular rhythm.   No murmur heard. Pulmonary/Chest: Effort normal and breath sounds normal.  Abdominal: Soft. He exhibits no distension.  VAC dressing on midline incision. Left-sided colostomy.    Lab Results Lab Results  Component Value Date   WBC 23.2 (H) 11/24/2015   HGB 8.1 (L) 11/24/2015   HCT 25.3 (L) 11/24/2015   MCV 86.6 11/24/2015   PLT 413 (H) 11/24/2015    Lab Results  Component Value Date   CREATININE 0.90 11/24/2015   BUN 13 11/24/2015   NA 135 11/24/2015   K 3.0 (L) 11/24/2015  CL 94 (L) 11/24/2015   CO2 30 11/24/2015    Lab Results  Component Value Date   ALT 15 (L) 11/24/2015   AST 18 11/24/2015   ALKPHOS 106 11/24/2015   BILITOT 0.9 11/24/2015     Microbiology: Recent Results (from the past 240 hour(s))  MRSA PCR Screening     Status: None   Collection Time: 11/14/15  5:41 PM  Result Value Ref Range Status   MRSA by PCR NEGATIVE NEGATIVE Final    Comment:        The GeneXpert MRSA Assay (FDA approved for NASAL specimens only), is one component of a comprehensive MRSA colonization surveillance program. It is not intended to diagnose MRSA infection nor to guide or monitor treatment for MRSA infections.   Culture, blood (routine x 2)     Status: None   Collection Time: 11/18/15  3:10 PM  Result Value Ref Range Status   Specimen Description BLOOD LEFT ANTECUBITAL  Final   Special Requests BOTTLES DRAWN AEROBIC AND ANAEROBIC 10CC  Final   Culture NO GROWTH 5 DAYS  Final   Report Status 11/23/2015 FINAL  Final  Culture, blood (Routine X 2) w Reflex to ID Panel      Status: None   Collection Time: 11/18/15  5:42 PM  Result Value Ref Range Status   Specimen Description BLOOD PICC LINE  Final   Special Requests BOTTLES DRAWN AEROBIC AND ANAEROBIC 5CC  Final   Culture NO GROWTH 5 DAYS  Final   Report Status 11/23/2015 FINAL  Final  Aerobic/Anaerobic Culture (surgical/deep wound)     Status: None (Preliminary result)   Collection Time: 11/21/15  8:32 AM  Result Value Ref Range Status   Specimen Description TISSUE ABDOMEN  Final   Special Requests ABDOMINAL INFECTION  Final   Gram Stain   Final    ABUNDANT WBC PRESENT,BOTH PMN AND MONONUCLEAR NO ORGANISMS SEEN    Culture   Final    NO GROWTH 2 DAYS NO ANAEROBES ISOLATED; CULTURE IN PROGRESS FOR 5 DAYS   Report Status PENDING  Incomplete     ASSESSMENT: He underwent partial small bowel resection and is now improving on therapy for peritonitis. I do not feel like he needs ongoing coverage for MRSA.  PLAN: 1. Continue piperacillin tazobactam and fluconazole 2. Discontinue vancomycin 3. I will follow-up midweek  Michel Bickers, MD [facility] for [facility] [phone] pager   [phone] cell 11/24/2015, 10:17 AM

## 2015-11-25 LAB — BASIC METABOLIC PANEL
Anion gap: 7 (ref 5–15)
BUN: 14 mg/dL (ref 6–20)
CHLORIDE: 98 mmol/L — AB (ref 101–111)
CO2: 28 mmol/L (ref 22–32)
Calcium: 7.3 mg/dL — ABNORMAL LOW (ref 8.9–10.3)
Creatinine, Ser: 0.86 mg/dL (ref 0.61–1.24)
GFR calc Af Amer: >60 mL/min (ref >60)
Glucose, Bld: 135 mg/dL — ABNORMAL HIGH (ref 65–99)
POTASSIUM: 3.7 mmol/L (ref 3.5–5.1)
SODIUM: 133 mmol/L — AB (ref 135–145)

## 2015-11-25 MED ORDER — TRACE MINERALS CR-CU-MN-SE-ZN 10-1000-500-60 MCG/ML IV SOLN
INTRAVENOUS | Status: AC
  Administered 2015-11-25: 17:00:00 via INTRAVENOUS
  Filled 2015-11-25: qty 1920

## 2015-11-25 MED ORDER — FAT EMULSION 20 % IV EMUL
240.0000 mL | INTRAVENOUS | Status: AC
  Administered 2015-11-25: 240 mL via INTRAVENOUS
  Filled 2015-11-25: qty 250

## 2015-11-25 NOTE — Care Management Note (Addendum)
Case Management Note  Patient Details  Name: Miguel [name] MRN: [id number] Date of Birth: 07-29-1950  Subjective/Objective:   Pt sleeping, CM spoke with sister @ bedside who is planning to provide support when pt is discharged.  Reports pt's wife works but that she and pt's daughter will be available to assist as needed when wife is @ work.  CM noted that pt refused home health services when previously discharged, will continue to follow as needs are determined.                            Expected Discharge Plan:  [place]  Discharge planning Services  CM Consult  Status of Service:  In process, will continue to follow  Girard Cooter, RN 11/25/2015, 2:19 PM

## 2015-11-25 NOTE — Progress Notes (Addendum)
Nutrition Follow-up  DOCUMENTATION CODES:   Severe malnutrition in context of acute illness/injury  INTERVENTION:    TPN per pharmacy  NUTRITION DIAGNOSIS:   Malnutrition related to acute illness as evidenced by percent weight loss, moderate depletions of muscle mass, moderate depletion of body fat, ongoing  GOAL:   Patient will meet greater than or equal to 90% of their needs, met  MONITOR:   Diet advancement, PO intake, Labs, Weight trends, Skin, I & O's  ASSESSMENT:   65 year old male. He is status post an antrectomy with B2 anastomosis on August 3 for bleeding ulcer. He has had a previous colostomy for perforated diverticulitis. He was discharged home approximate 4 days ago. He has had increasing fatigue, abdominal discomfort, nausea, and fever over the past 2 days.   Patient s/p procedures 8/25: REDUCTION OF HIATAL HERNIA REPAIR HIATAL HERNIA RESECTION SMALL BOWEL WITH ANASTAMOSIS PLACEMENT GASTROSTOMY TUBE PLACEMENT DUODENOSTOMY TUBE  Patient extubated 8/26. CWOCN note reviewed 8/28 >> pt with full thickness post-op wound to midline abd. G-tube and J-tube drains to gravity. Per Surgery, for PO diet trial in next 1-2 days.  Patient is receiving TPN with Clinimix E 5/15 @ 80 ml/hr and lipids @ 10 ml/hr.  Provides 1843 kcal and 96 grams protein per day.  Meets 100% minimum re-estimated energy needs and 91% minimum re-estimated protein needs.  Diet Order:  Diet NPO time specified TPN (CLINIMIX-E) Adult TPN (CLINIMIX-E) Adult  Skin:  Wound (see comment) (post-op full thickness abd wound)  Last BM:  8/29  Height:   Ht Readings from Last 1 Encounters:  11/14/15 '5\' 6"'  (1.676 m)    Weight:   Wt Readings from Last 1 Encounters:  11/25/15 156 lb 4.9 oz (70.9 kg)    Ideal Body Weight:  64.5 kg  BMI:  Body mass index is 25.23 kg/m.  Re-estimated Nutritional Needs:   Kcal:  1800-2000  Protein:  105-115 gm  Fluid:  1.8-2.0 L  EDUCATION NEEDS:   No  education needs identified at this time  Arthur Holms, RD, LDN Pager #: [phone] After-Hours Pager #: [phone]

## 2015-11-25 NOTE — Progress Notes (Signed)
PARENTERAL NUTRITION CONSULT NOTE - FOLLOW UP  Pharmacy Consult:  TPN Indication:  Intolerance to enteral feeding  Allergies  Allergen Reactions  . No Known Allergies    Patient Measurements: Height: 5\' 6"  (167.6 cm) Weight: 156 lb 4.9 oz (70.9 kg) IBW/kg (Calculated) : 63.8  Vital Signs: Temp: 98.4 F (36.9 C) (08/29 0738) Temp Source: Oral (08/29 0738) BP: 133/87 (08/29 0700) Intake/Output from previous day: 08/28 0701 - 08/29 0700 In: 3011.5 [I.V.:2411.5; IV Piggyback:600] Out: 3450 [Urine:2530; Drains:920] Intake/Output from this shift: No intake/output data recorded.  Labs:  Recent Labs  11/23/15 0330 11/24/15 0410  WBC 24.8* 23.2*  HGB 8.1* 8.1*  HCT 25.7* 25.3*  PLT 374 413*     Recent Labs  11/23/15 0330 11/24/15 0410 11/25/15 0410  NA 135 135 133*  K 3.7 3.0* 3.7  CL 104 94* 98*  CO2 26 30 28   GLUCOSE 141* 132* 135*  BUN 13 13 14   CREATININE 0.86 0.90 0.86  CALCIUM 6.9* 7.1* 7.3*  MG 2.0 1.7  --   PHOS 2.6 3.4  --   PROT  --  4.9*  --   ALBUMIN  --  1.3*  --   AST  --  18  --   ALT  --  15*  --   ALKPHOS  --  106  --   BILITOT  --  0.9  --   PREALBUMIN  --  5.0*  --   TRIG  --  98  --    Estimated Creatinine Clearance: 77.3 mL/min (by C-G formula based on SCr of 0.86 mg/dL).    Recent Labs  11/24/15 0004 11/24/15 0414 11/24/15 0829  GLUCAP 150* 129* 145*     Insulin Requirements in the past 24 hours:  SSI d/c'ed 11/24/15  Assessment [name] presented on 8/17/2017with fever, nausea and vomiting.  He was recently discharged after repair of a bleeding ulcer and was on TPN during that admission.  Repeat CT showed thickening of the gastrojejunostomy limb of his anastomosis as well as the duodenum.  MD reports minimal nutrition over the last couple of weeks and Pharmacy consulted to resume TPN.    Surgeries/Procedures: 06/2015 - perforated sigmoid diverticulitis s/p colostomy  8/25 - partial small bowel resection for perforation  along incarcerated limb of hernia 8/3 - Billroth II repair, partial gastrectomy for giant ulcer  GI: Pepcid in TPN.  Prealbumin low at 5.  Drain O/P 920 mL.  PO trial in 1-2 days per Surgery Endo: no hx DM - CBGs controlled Lytes: low Na/CL, others WNL Renal: ureteral stone should pass w/o intervention per Urology - SCr 0.86, CrCL 77 ml/min, good UOP 1.5 ml/kg/hr, NS 20K at 20 ml/hr, Flomax Pulm: extubated, reduced to 2L [place] Cards: VSS, CVP 3 Hepatobil: LFTs / tbili /  TG WNL Neuro: off sedation, GCS 15, pain score 0-5 on PRN Fentanyl ID: Zosyn/Fluc for bowel perf/peritonitis, s/p vanc - afebrile, WBC down 23.2, cultures NGTD Best Practices: Lovenox, MC TPN Access: double lumen PICC 11/15/15 TPN start date: 11/16/15  Current Nutrition: TPN  Nutritional Goals: 1778 kCal (will target 1750-1850), 85-100g protein per day   Plan: - Continue Clinimix E 5/15 at 80 ml/hr and lipids at 10 ml/hr.  TPN provides 1843 kCal and 96gm of protein daily, meeting 100% of patient's needs. - Daily multivitamin and trace elements - Pepcid 40mg  daily in TPN - F/U daily   Shykeria Sakamoto D. Mina Marble, PharmD, BCPS Pager:  [phone] 11/25/2015, 8:57 AM

## 2015-11-25 NOTE — Progress Notes (Signed)
4 Days Post-Op  Subjective: Pt doing well.  Ambulating in hall.  Objective: Vital signs in last 24 hours: Temp:  [98.1 F (36.7 C)-99.6 F (37.6 C)] 98.4 F (36.9 C) (08/29 0738) Pulse Rate:  [90] 90 (08/28 1223) Resp:  [16-39] 18 (08/29 0700) BP: (131-151)/(76-92) 133/87 (08/29 0700) SpO2:  [91 %-100 %] 100 % (08/29 0700) Weight:  [70.9 kg (156 lb 4.9 oz)] 70.9 kg (156 lb 4.9 oz) (08/29 0600) Last BM Date: 11/25/15 (small amount of stool in ostomy bag)  Intake/Output from previous day: 08/28 0701 - 08/29 0700 In: 3011.5 [I.V.:2411.5; IV Piggyback:600] Out: 3450 [Urine:2530; Drains:920] Intake/Output this shift: No intake/output data recorded.  General appearance: alert and cooperative GI: soft, ostomy patent, midline c/d/i, JP-1 bilious and JP-2 SS, Gastric tube bilious  Lab Results:   Recent Labs  11/23/15 0330 11/24/15 0410  WBC 24.8* 23.2*  HGB 8.1* 8.1*  HCT 25.7* 25.3*  PLT 374 413*   BMET  Recent Labs  11/24/15 0410 11/25/15 0410  NA 135 133*  K 3.0* 3.7  CL 94* 98*  CO2 30 28  GLUCOSE 132* 135*  BUN 13 14  CREATININE 0.90 0.86  CALCIUM 7.1* 7.3*   PT/INR No results for input(s): LABPROT, INR in the last 72 hours. ABG No results for input(s): PHART, HCO3 in the last 72 hours.  Invalid input(s): PCO2, PO2  Studies/Results: No results found.  Anti-infectives: Anti-infectives    Start     Dose/Rate Route Frequency Ordered Stop   11/22/15 0500  vancomycin (VANCOCIN) IVPB 1000 mg/200 mL premix  Status:  Discontinued     1,000 mg 200 mL/hr over 60 Minutes Intravenous Every 8 hours 11/21/15 1950 11/24/15 1022   11/21/15 2030  vancomycin (VANCOCIN) IVPB 1000 mg/200 mL premix     1,000 mg 200 mL/hr over 60 Minutes Intravenous STAT 11/21/15 1950 11/22/15 0051   11/21/15 1130  piperacillin-tazobactam (ZOSYN) IVPB 3.375 g  Status:  Discontinued     3.375 g 12.5 mL/hr over 240 Minutes Intravenous To Surgery 11/21/15 1128 11/21/15 1403   11/19/15  0400  vancomycin (VANCOCIN) IVPB 750 mg/150 ml premix  Status:  Discontinued     750 mg 150 mL/hr over 60 Minutes Intravenous Every 12 hours 11/18/15 1459 11/21/15 1950   11/18/15 1600  fluconazole (DIFLUCAN) IVPB 400 mg     400 mg 100 mL/hr over 120 Minutes Intravenous Every 24 hours 11/18/15 1452     11/18/15 1530  vancomycin (VANCOCIN) 1,250 mg in sodium chloride 0.9 % 250 mL IVPB     1,250 mg 166.7 mL/hr over 90 Minutes Intravenous  Once 11/18/15 1452 11/18/15 1940   11/14/15 0600  piperacillin-tazobactam (ZOSYN) IVPB 3.375 g     3.375 g 12.5 mL/hr over 240 Minutes Intravenous Every 8 hours 11/13/15 2328     11/13/15 2300  piperacillin-tazobactam (ZOSYN) IVPB 3.375 g     3.375 g 100 mL/hr over 30 Minutes Intravenous  Once 11/13/15 2257 11/14/15 0217      Assessment/Plan: s/p Procedure(s): REDUCTION OF HIATAL HERNIA , REPAIR HIATAL HERNIA, RESECTION SMALL BOWEL WITH ANASTOMOSIS, PLACEMENT GASTROSTOMY TUBE, PLACEMENT DUODENOSTOMY TUBE (N/A) 4 Days Post-Op  Con't TNA con't drains to gravity Mobilize as tol Will trial PO in next 1-2d    LOS: 11 days    Rosario Jacks., Anne Hahn 11/25/2015

## 2015-11-26 LAB — AEROBIC/ANAEROBIC CULTURE (SURGICAL/DEEP WOUND)

## 2015-11-26 LAB — AEROBIC/ANAEROBIC CULTURE W GRAM STAIN (SURGICAL/DEEP WOUND): Culture: NO GROWTH

## 2015-11-26 MED ORDER — TRACE MINERALS CR-CU-MN-SE-ZN 10-1000-500-60 MCG/ML IV SOLN
INTRAVENOUS | Status: AC
  Administered 2015-11-26: 18:00:00 via INTRAVENOUS
  Filled 2015-11-26: qty 1920

## 2015-11-26 MED ORDER — FAT EMULSION 20 % IV EMUL
240.0000 mL | INTRAVENOUS | Status: AC
  Administered 2015-11-26: 240 mL via INTRAVENOUS
  Filled 2015-11-26: qty 250

## 2015-11-26 NOTE — Progress Notes (Signed)
Patient ID: Miguel [name], male   DOB: 02/17/1951, 65 y.o.   MRN: [id number]          [facility] for Infectious Disease    Date of Admission:  11/13/2015   Total days of antibiotics 33        Day 13 piperacillin tazobactam        Day 9 fluconazole        Postop day 5  Principal Problem:   Postoperative fever Active Problems:   S/P partial gastrectomy   Hyperglycemia   Hiccups   AKI (acute kidney injury) ([place])   Severe protein-calorie malnutrition ([place])   . chlorhexidine  15 mL Mouth Rinse BID  . enoxaparin (LOVENOX) injection  40 mg Subcutaneous Q24H  . fluconazole (DIFLUCAN) IV  400 mg Intravenous Q24H  . mouth rinse  15 mL Mouth Rinse q12n4p  . piperacillin-tazobactam (ZOSYN)  IV  3.375 g Intravenous Q8H  . sodium chloride flush  10-40 mL Intracatheter Q12H    SUBJECTIVE: He is feeling much better. He is starting to get his appetite back.  Review of Systems: Review of Systems  Constitutional: Negative for chills, diaphoresis and fever.  Respiratory: Negative for cough.   Gastrointestinal: Negative for abdominal pain, diarrhea, nausea and vomiting.    Past Medical History:  Diagnosis Date  . Anemia   . Anxiety   . Gastritis   . GI bleed due to NSAIDs 10/27/2015  . Hyperlipidemia   . Hypertension   . Kidney stones   . 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 Sigmoid diverticulitis    with perforation    Social History  Substance Use Topics  . Smoking status: Never Smoker  . Smokeless tobacco: Former Systems developer    Types: Snuff    Quit date: 07/14/2015  . Alcohol use No    Family History  Problem Relation Age of Onset  . Stroke Mother   . Stroke Brother   . Heart disease Brother    Allergies  Allergen Reactions  . No Known Allergies     OBJECTIVE: Vitals:   11/26/15 0700 11/26/15 0800 11/26/15 0900 11/26/15 1000  BP: (!) 149/91 (!) 141/95 137/89 (!) 160/96  Pulse: 92     Resp: (!) 27 (!) 31 (!) 21 18  Temp:  98.8 F (37.1 C)    TempSrc:   Oral    SpO2:  96%    Weight:      Height:       Body mass index is 25.23 kg/m.  Physical Exam  Constitutional:  He is smiling and in good spirits today.  Cardiovascular: Normal rate and regular rhythm.   No murmur heard. Pulmonary/Chest: Effort normal and breath sounds normal.  Abdominal: Soft. He exhibits no distension.  Clean, dry gauze dressing on midline incision. Left-sided colostomy.    Lab Results Lab Results  Component Value Date   WBC 23.2 (H) 11/24/2015   HGB 8.1 (L) 11/24/2015   HCT 25.3 (L) 11/24/2015   MCV 86.6 11/24/2015   PLT 413 (H) 11/24/2015    Lab Results  Component Value Date   CREATININE 0.86 11/25/2015   BUN 14 11/25/2015   NA 133 (L) 11/25/2015   K 3.7 11/25/2015   CL 98 (L) 11/25/2015   CO2 28 11/25/2015    Lab Results  Component Value Date   ALT 15 (L) 11/24/2015   AST 18 11/24/2015   ALKPHOS 106 11/24/2015   BILITOT 0.9 11/24/2015  Microbiology: Recent Results (from the past 240 hour(s))  Culture, blood (routine x 2)     Status: None   Collection Time: 11/18/15  3:10 PM  Result Value Ref Range Status   Specimen Description BLOOD LEFT ANTECUBITAL  Final   Special Requests BOTTLES DRAWN AEROBIC AND ANAEROBIC 10CC  Final   Culture NO GROWTH 5 DAYS  Final   Report Status 11/23/2015 FINAL  Final  Culture, blood (Routine X 2) w Reflex to ID Panel     Status: None   Collection Time: 11/18/15  5:42 PM  Result Value Ref Range Status   Specimen Description BLOOD PICC LINE  Final   Special Requests BOTTLES DRAWN AEROBIC AND ANAEROBIC 5CC  Final   Culture NO GROWTH 5 DAYS  Final   Report Status 11/23/2015 FINAL  Final  Aerobic/Anaerobic Culture (surgical/deep wound)     Status: None   Collection Time: 11/21/15  8:32 AM  Result Value Ref Range Status   Specimen Description TISSUE ABDOMEN  Final   Special Requests ABDOMINAL INFECTION  Final   Gram Stain   Final    ABUNDANT WBC PRESENT,BOTH PMN AND MONONUCLEAR NO ORGANISMS SEEN     Culture No growth aerobically or anaerobically.  Final   Report Status 11/26/2015 FINAL  Final     ASSESSMENT: He is improving on broad empiric antibiotic therapy for peritonitis following repair of small bowel leak. I agree with Dr. Johney Frame plan for about one more week of antibiotic therapy.  PLAN: 1. Continue piperacillin tazobactam and fluconazole 2. I will sign off now but please call if I can be of further assistance while he is here  Michel Bickers, [place] for [place] [phone] pager   [phone] cell 11/26/2015, 11:11 AM

## 2015-11-26 NOTE — Progress Notes (Signed)
Physical Therapy Treatment Patient Details Name: Miguel [name] MRN: [id number] DOB: 1950/05/28 Today's Date: 11/26/2015    History of Present Illness Patient is a 65 yo male admitted 11/13/15 with fever, confusion following partial gastrectomy on 10/30/15.  Patient s/p repair of bowel obstruction, found perforation requiring additional resection.  Post-op acute resp failure and intubated 11/20/15.  Extubated 11/22/15.    PMH:  partial gastrectomy 10/30/15, anemia, anxiety, HTN    PT Comments    Patient progressing with activity tolerance and able to use walker this session due to no longer with wound vac.  Feel continued skilled PT in the acute setting will assist in prep for d/c home with family support.   Follow Up Recommendations  Home health PT;Supervision/Assistance - 24 hour     Equipment Recommendations  Rolling walker with 5" wheels;3in1 (PT)    Recommendations for Other Services       Precautions / Restrictions Precautions Precautions: Fall Precaution Comments: 3 drains Restrictions Weight Bearing Restrictions: No    Mobility  Bed Mobility Overal bed mobility: Needs Assistance   Rolling: Min guard Sidelying to sit: Min assist       General bed mobility comments: cues for technique, pt pulling up to sit with assist  Transfers Overall transfer level: Needs assistance Equipment used: Rolling walker (2 wheeled)   Sit to Stand: Min guard            Ambulation/Gait Ambulation/Gait assistance: Min assist Ambulation Distance (Feet): 300 Feet Assistive device: Rolling walker (2 wheeled) Gait Pattern/deviations: Step-through pattern;Shuffle;Trunk flexed     General Gait Details: cues for posture, direction, walked with walker due to no vac and pt felt easier to control   Stairs            Wheelchair Mobility    Modified Rankin (Stroke Patients Only)       Balance Overall balance assessment: Needs assistance   Sitting balance-Leahy Scale: Good      Standing balance support: Bilateral upper extremity supported Standing balance-Leahy Scale: Poor Standing balance comment: UE support needed for balance                    Cognition Arousal/Alertness: Awake/alert Behavior During Therapy: WFL for tasks assessed/performed Overall Cognitive Status: Within Functional Limits for tasks assessed                      Exercises      General Comments        Pertinent Vitals/Pain Pain Score: 6  Pain Location: abdomen Pain Descriptors / Indicators: Discomfort;Operative site guarding Pain Intervention(s): Monitored during session;Patient requesting pain meds-RN notified    Home Living                      Prior Function            PT Goals (current goals can now be found in the care plan section) Progress towards PT goals: Progressing toward goals    Frequency  Min 3X/week    PT Plan Current plan remains appropriate    Co-evaluation             End of Session   Activity Tolerance: Patient tolerated treatment well Patient left: in chair;with call bell/phone within reach     Time: 1012-1030 PT Time Calculation (min) (ACUTE ONLY): 18 min  Charges:  $Gait Training: 8-22 mins  G CodesReginia Naas 11/26/2015, 10:42 AM Magda Kiel, PT [phone] 11/26/2015

## 2015-11-26 NOTE — Progress Notes (Signed)
PARENTERAL NUTRITION CONSULT NOTE - FOLLOW UP  Pharmacy Consult:  TPN Indication:  Intolerance to enteral feeding  Allergies  Allergen Reactions  . No Known Allergies    Patient Measurements: Height: 5\' 6"  (167.6 cm) Weight: 156 lb 4.9 oz (70.9 kg) IBW/kg (Calculated) : 63.8  Vital Signs: Temp: 98.8 F (37.1 C) (08/30 0800) Temp Source: Oral (08/30 0800) BP: 141/95 (08/30 0800) Pulse Rate: 92 (08/30 0700) Intake/Output from previous day: 08/29 0701 - 08/30 0700 In: 2870.3 [I.V.:2520.3; IV Piggyback:350] Out: [id number] [Urine:2900; Drains:965; Stool:100] Intake/Output from this shift: Total I/O In: 110 [I.V.:110] Out: 260 [Urine:100; Drains:85; Stool:75]  Labs:  Recent Labs  11/24/15 0410  WBC 23.2*  HGB 8.1*  HCT 25.3*  PLT 413*     Recent Labs  11/24/15 0410 11/25/15 0410  NA 135 133*  K 3.0* 3.7  CL 94* 98*  CO2 30 28  GLUCOSE 132* 135*  BUN 13 14  CREATININE 0.90 0.86  CALCIUM 7.1* 7.3*  MG 1.7  --   PHOS 3.4  --   PROT 4.9*  --   ALBUMIN 1.3*  --   AST 18  --   ALT 15*  --   ALKPHOS 106  --   BILITOT 0.9  --   PREALBUMIN 5.0*  --   TRIG 98  --    Estimated Creatinine Clearance: 77.3 mL/min (by C-G formula based on SCr of 0.86 mg/dL).    Recent Labs  11/24/15 0004 11/24/15 0414 11/24/15 0829  GLUCAP 150* 129* 145*     Insulin Requirements in the past 24 hours:  SSI d/c'ed 11/24/15  Assessment Miguel [name] presented on 8/17/2017with fever, nausea and vomiting.  He was recently discharged after repair of a bleeding ulcer and was on TPN during that admission.  Repeat CT showed thickening of the gastrojejunostomy limb of his anastomosis as well as the duodenum.  MD reports minimal nutrition over the last couple of weeks and Pharmacy consulted to resume TPN.    Surgeries/Procedures: 06/2015 - perforated sigmoid diverticulitis s/p colostomy  8/25 - partial small bowel resection for perforation along incarcerated limb of hernia 8/3 - Billroth II  repair, partial gastrectomy for giant ulcer  GI: Pepcid in TPN.  Prealbumin low at 5.  Drain O/P 965 mL.  Surgery plans to clamp G tube today.  If tolerates clamping will trial clear liquids. Endo: no hx DM - CBGs controlled Lytes: low Na/CL, others WNL Renal: ureteral stone should pass w/o intervention per Urology - SCr 0.86, CrCL 77 ml/min, good UOP 1.5 ml/kg/hr, NS 20K at 20 ml/hr, Flomax Pulm: extubated, reduced to 2L [place] Cards: VSS, CVP 3 Hepatobil: LFTs / tbili /  TG WNL Neuro: off sedation, GCS 15, pain score 0-5 on PRN Fentanyl ID: Zosyn/Fluc for bowel perf/peritonitis, s/p vanc - afebrile, WBC down 23.2, cultures NGTD.  ID team following patient as well. Best Practices: Lovenox, MC TPN Access: double lumen PICC 11/15/15 TPN start date: 11/16/15  Current Nutrition: TPN  Nutritional Goals: 1778 kCal (will target 1750-1850), 85-100g protein per day   Plan: - Continue Clinimix E 5/15 at 80 ml/hr and lipids at 10 ml/hr.  TPN provides 1843 kCal and 96gm of protein daily, meeting 100% of patient's needs. - Daily multivitamin and trace elements - Pepcid 40mg  daily in TPN - F/U potential PO trial - TPN labs on Thursday (CMET, Mag, Phos)   Legrand Como, Pharm.D., BCPS, AAHIVP Clinical Pharmacist Phone: [phone] or [phone] Pager: [phone] 11/26/2015, 9:15 AM

## 2015-11-26 NOTE — Progress Notes (Signed)
5 Days Post-Op  Subjective: Doing very well today.  Already ambulated this AM  Objective: Vital signs in last 24 hours: Temp:  [98.3 F (36.8 C)-98.9 F (37.2 C)] 98.8 F (37.1 C) (08/29 2358) Pulse Rate:  [82-98] 92 (08/30 0700) Resp:  [17-37] 19 (08/30 0600) BP: (123-160)/(80-105) 153/99 (08/30 0600) SpO2:  [95 %-98 %] 97 % (08/30 0600) Last BM Date: 11/25/15 (small amount of stool in ostomy bag)  Intake/Output from previous day: 08/29 0701 - 08/30 0700 In: 2870.3 [I.V.:2520.3; IV Piggyback:350] Out: [id number] [Urine:2900; Drains:965; Stool:100] Intake/Output this shift: No intake/output data recorded.  General appearance: alert and cooperative Cardio: regular rate and rhythm, S1, S2 normal, no murmur, click, rub or gallop GI: soft, wound c/d/i, ostomy patent, G-tube bilious, JP bilious,  Lab Results:   Recent Labs  11/24/15 0410  WBC 23.2*  HGB 8.1*  HCT 25.3*  PLT 413*   BMET  Recent Labs  11/24/15 0410 11/25/15 0410  NA 135 133*  K 3.0* 3.7  CL 94* 98*  CO2 30 28  GLUCOSE 132* 135*  BUN 13 14  CREATININE 0.90 0.86  CALCIUM 7.1* 7.3*   PT/INR No results for input(s): LABPROT, INR in the last 72 hours. ABG No results for input(s): PHART, HCO3 in the last 72 hours.  Invalid input(s): PCO2, PO2  Studies/Results: No results found.  Anti-infectives: Anti-infectives    Start     Dose/Rate Route Frequency Ordered Stop   11/22/15 0500  vancomycin (VANCOCIN) IVPB 1000 mg/200 mL premix  Status:  Discontinued     1,000 mg 200 mL/hr over 60 Minutes Intravenous Every 8 hours 11/21/15 1950 11/24/15 1022   11/21/15 2030  vancomycin (VANCOCIN) IVPB 1000 mg/200 mL premix     1,000 mg 200 mL/hr over 60 Minutes Intravenous STAT 11/21/15 1950 11/22/15 0051   11/21/15 1130  piperacillin-tazobactam (ZOSYN) IVPB 3.375 g  Status:  Discontinued     3.375 g 12.5 mL/hr over 240 Minutes Intravenous To Surgery 11/21/15 1128 11/21/15 1403   11/19/15 0400  vancomycin  (VANCOCIN) IVPB 750 mg/150 ml premix  Status:  Discontinued     750 mg 150 mL/hr over 60 Minutes Intravenous Every 12 hours 11/18/15 1459 11/21/15 1950   11/18/15 1600  fluconazole (DIFLUCAN) IVPB 400 mg     400 mg 100 mL/hr over 120 Minutes Intravenous Every 24 hours 11/18/15 1452     11/18/15 1530  vancomycin (VANCOCIN) 1,250 mg in sodium chloride 0.9 % 250 mL IVPB     1,250 mg 166.7 mL/hr over 90 Minutes Intravenous  Once 11/18/15 1452 11/18/15 1940   11/14/15 0600  piperacillin-tazobactam (ZOSYN) IVPB 3.375 g     3.375 g 12.5 mL/hr over 240 Minutes Intravenous Every 8 hours 11/13/15 2328     11/13/15 2300  piperacillin-tazobactam (ZOSYN) IVPB 3.375 g     3.375 g 100 mL/hr over 30 Minutes Intravenous  Once 11/13/15 2257 11/14/15 0217      Assessment/Plan: s/p Procedure(s): REDUCTION OF HIATAL HERNIA , REPAIR HIATAL HERNIA, RESECTION SMALL BOWEL WITH ANASTOMOSIS, PLACEMENT GASTROSTOMY TUBE, PLACEMENT DUODENOSTOMY TUBE (N/A) Will plan on clamping Gtube this AM.  If nauseated will resume gravity drainage.  If tol will start clear liq Mobilize con't abx x 7d and DC Con't TNA   LOS: 12 days    Rosario Jacks., Anne Hahn 11/26/2015

## 2015-11-27 LAB — COMPREHENSIVE METABOLIC PANEL
ALK PHOS: 141 U/L — AB (ref 38–126)
ALT: 19 U/L (ref 17–63)
AST: 20 U/L (ref 15–41)
Albumin: 1.5 g/dL — ABNORMAL LOW (ref 3.5–5.0)
Anion gap: 7 (ref 5–15)
BILIRUBIN TOTAL: 1.2 mg/dL (ref 0.3–1.2)
BUN: 15 mg/dL (ref 6–20)
CHLORIDE: 101 mmol/L (ref 101–111)
CO2: 24 mmol/L (ref 22–32)
CREATININE: 0.86 mg/dL (ref 0.61–1.24)
Calcium: 7.5 mg/dL — ABNORMAL LOW (ref 8.9–10.3)
GFR calc Af Amer: >60 mL/min (ref >60)
Glucose, Bld: 142 mg/dL — ABNORMAL HIGH (ref 65–99)
Potassium: 3.7 mmol/L (ref 3.5–5.1)
Sodium: 132 mmol/L — ABNORMAL LOW (ref 135–145)
Total Protein: 5.5 g/dL — ABNORMAL LOW (ref 6.5–8.1)

## 2015-11-27 LAB — MAGNESIUM: MAGNESIUM: 1.9 mg/dL (ref 1.7–2.4)

## 2015-11-27 LAB — PHOSPHORUS: Phosphorus: 3.1 mg/dL (ref 2.5–4.6)

## 2015-11-27 MED ORDER — FAT EMULSION 20 % IV EMUL
240.0000 mL | INTRAVENOUS | Status: AC
  Administered 2015-11-27: 240 mL via INTRAVENOUS
  Filled 2015-11-27: qty 250

## 2015-11-27 MED ORDER — TRACE MINERALS CR-CU-MN-SE-ZN 10-1000-500-60 MCG/ML IV SOLN
INTRAVENOUS | Status: AC
  Administered 2015-11-27: 17:00:00 via INTRAVENOUS
  Filled 2015-11-27: qty 1920

## 2015-11-27 NOTE — Progress Notes (Signed)
Physical Therapy Treatment Patient Details Name: Miguel [name] MRN: [id number] DOB: 12/27/1950 Today's Date: 11/27/2015    History of Present Illness Patient is a 65 yo male admitted 11/13/15 with fever, confusion following partial gastrectomy on 10/30/15.  Patient s/p repair of bowel obstruction, found perforation requiring additional resection.  Post-op acute resp failure and intubated 11/20/15.  Extubated 11/22/15.    PMH:  partial gastrectomy 10/30/15, anemia, anxiety, HTN    PT Comments    Pt continues to make excellent improvement with mobility. Pt encouraged to be OOB throughout the day with nursing staff as well as continue HEP to improve strength. Will continue to work toward increasing strength to hopefully ambulate without an assistive device.   Follow Up Recommendations  Home health PT;Supervision - Intermittent     Equipment Recommendations  Rolling walker with 5" wheels    Recommendations for Other Services       Precautions / Restrictions Precautions Precautions: Fall Precaution Comments: 3 drains Restrictions Weight Bearing Restrictions: No    Mobility  Bed Mobility Overal bed mobility: Needs Assistance Bed Mobility: Supine to Sit     Supine to sit: Supervision;HOB elevated     General bed mobility comments: cues for technique, pt pulling up on rail to sit   Transfers Overall transfer level: Needs assistance     Sit to Stand: Supervision         General transfer comment: cues for safety, assist for line management  Ambulation/Gait Ambulation/Gait assistance: Min guard Ambulation Distance (Feet): 600 Feet Assistive device: Rolling walker (2 wheeled) Gait Pattern/deviations: Step-through pattern;Decreased stride length   Gait velocity interpretation: Below normal speed for age/gender General Gait Details: cues for position in RW   Stairs            Wheelchair Mobility    Modified Rankin (Stroke Patients Only)       Balance                                    Cognition Arousal/Alertness: Awake/alert Behavior During Therapy: WFL for tasks assessed/performed Overall Cognitive Status: Within Functional Limits for tasks assessed                      Exercises General Exercises - Lower Extremity Long Arc Quad: AROM;Both;15 reps;Seated Hip ABduction/ADduction: AROM;15 reps;Both;Seated Hip Flexion/Marching: AROM;Both;15 reps;Seated Toe Raises: AROM;15 reps;Both;Seated Heel Raises: AROM;15 reps;Both;Seated    General Comments        Pertinent Vitals/Pain Pain Assessment: No/denies pain    Home Living                      Prior Function            PT Goals (current goals can now be found in the care plan section) Progress towards PT goals: Progressing toward goals    Frequency       PT Plan Discharge plan needs to be updated    Co-evaluation             End of Session   Activity Tolerance: Patient tolerated treatment well Patient left: in chair;with call bell/phone within reach;with family/visitor present     Time: [id number] PT Time Calculation (min) (ACUTE ONLY): 23 min  Charges:  $Gait Training: 8-22 mins $Therapeutic Exercise: 8-22 mins  G CodesMelford Aase Dec 08, 2015, 1:47 PM Elwyn Reach, Katie

## 2015-11-27 NOTE — Progress Notes (Signed)
6 Days Post-Op  Subjective: Pt is doing well Tol some min clears  Objective: Vital signs in last 24 hours: Temp:  [98.3 F (36.8 C)-99.2 F (37.3 C)] 98.8 F (37.1 C) (08/31 0731) Pulse Rate:  [78-88] 82 (08/31 0700) Resp:  [16-25] 22 (08/31 0700) BP: (137-170)/(80-100) 157/88 (08/31 0700) SpO2:  [95 %-97 %] 95 % (08/31 0600) Last BM Date: 11/26/15  Intake/Output from previous day: 08/30 0701 - 08/31 0700 In: 2672.7 [I.V.:2522.7; IV Piggyback:150] Out: [id number] [Urine:2800; Drains:370; Stool:150] Intake/Output this shift: Total I/O In: -  Out: 300 [Urine:300]  General appearance: alert and cooperative  GI: ostomy patent, incision c/d/i, D-tube-bilious,   Lab Results:  No results for input(s): WBC, HGB, HCT, PLT in the last 72 hours. BMET  Recent Labs  11/25/15 0410 11/27/15 0400  NA 133* 132*  K 3.7 3.7  CL 98* 101  CO2 28 24  GLUCOSE 135* 142*  BUN 14 15  CREATININE 0.86 0.86  CALCIUM 7.3* 7.5*   PT/INR No results for input(s): LABPROT, INR in the last 72 hours. ABG No results for input(s): PHART, HCO3 in the last 72 hours.  Invalid input(s): PCO2, PO2  Studies/Results: No results found.  Anti-infectives: Anti-infectives    Start     Dose/Rate Route Frequency Ordered Stop   11/22/15 0500  vancomycin (VANCOCIN) IVPB 1000 mg/200 mL premix  Status:  Discontinued     1,000 mg 200 mL/hr over 60 Minutes Intravenous Every 8 hours 11/21/15 1950 11/24/15 1022   11/21/15 2030  vancomycin (VANCOCIN) IVPB 1000 mg/200 mL premix     1,000 mg 200 mL/hr over 60 Minutes Intravenous STAT 11/21/15 1950 11/22/15 0051   11/21/15 1130  piperacillin-tazobactam (ZOSYN) IVPB 3.375 g  Status:  Discontinued     3.375 g 12.5 mL/hr over 240 Minutes Intravenous To Surgery 11/21/15 1128 11/21/15 1403   11/19/15 0400  vancomycin (VANCOCIN) IVPB 750 mg/150 ml premix  Status:  Discontinued     750 mg 150 mL/hr over 60 Minutes Intravenous Every 12 hours 11/18/15 1459 11/21/15 1950   11/18/15 1600  fluconazole (DIFLUCAN) IVPB 400 mg     400 mg 100 mL/hr over 120 Minutes Intravenous Every 24 hours 11/18/15 1452 11/28/15 2359   11/18/15 1530  vancomycin (VANCOCIN) 1,250 mg in sodium chloride 0.9 % 250 mL IVPB     1,250 mg 166.7 mL/hr over 90 Minutes Intravenous  Once 11/18/15 1452 11/18/15 1940   11/14/15 0600  piperacillin-tazobactam (ZOSYN) IVPB 3.375 g     3.375 g 12.5 mL/hr over 240 Minutes Intravenous Every 8 hours 11/13/15 2328 11/28/15 2359   11/13/15 2300  piperacillin-tazobactam (ZOSYN) IVPB 3.375 g     3.375 g 100 mL/hr over 30 Minutes Intravenous  Once 11/13/15 2257 11/14/15 0217      Assessment/Plan: s/p Procedure(s): REDUCTION OF HIATAL HERNIA , REPAIR HIATAL HERNIA, RESECTION SMALL BOWEL WITH ANASTOMOSIS, PLACEMENT GASTROSTOMY TUBE, PLACEMENT DUODENOSTOMY TUBE (N/A) Con't with clears for now, con't to keep G-tube capped as long as tol PO with no n/v Mobilize Con't TNA   LOS: 13 days    Rosario Jacks., Anne Hahn 11/27/2015

## 2015-11-27 NOTE — Progress Notes (Signed)
Pharmacy Antibiotic Note  Miguel [name] is a 65 y.o. male admitted on 11/13/2015 with sepsis.  Pharmacy has been consulted for Zosyn dosing.  All cx have been negative & ID feels may be related to recent ulcer/surgery.  Pt is AFeb, last WBC improving.    Plan: Zosyn to continue through 9/1 per MD orders. Pharmacy will sign off  Height: 5\' 6"  (167.6 cm) Weight: 156 lb 4.9 oz (70.9 kg) IBW/kg (Calculated) : 63.8  Temp (24hrs), Avg:98.8 F (37.1 C), Min:98.3 F (36.8 C), Max:99.2 F (37.3 C)   Recent Labs Lab 11/21/15 0430 11/21/15 1835 11/22/15 0352 11/23/15 0330 11/24/15 0410 11/25/15 0410 11/27/15 0400  WBC 16.5* 19.3* 17.3* 24.8* 23.2*  --   --   CREATININE 0.77 0.76 0.80 0.86 0.90 0.86 0.86  LATICACIDVEN  --  1.6  --   --   --   --   --   VANCOTROUGH  --  6*  --   --   --   --   --     Estimated Creatinine Clearance: 77.3 mL/min (by C-G formula based on SCr of 0.86 mg/dL).    Allergies  Allergen Reactions  . No Known Allergies     Antimicrobials this admission: Zosyn 8/17 >> (9/1) Vancomycin 8/22 >> 8/28 Fluconazole 8/22 >> (9/1)  Dose adjustments this admission: 8/25: VT= 6; change to 1000mg  IV q8h  Microbiology results: Abd tissue 8/25 >> ngF Blood cx 8/22 > ngF Blood cx 8/17 > ngF Urine cx 8/17 > ngF  Thank you for allowing pharmacy to be a part of this patient's care.   Gracy Bruins, PharmD Clinical Pharmacist [facility]

## 2015-11-27 NOTE — Progress Notes (Signed)
PARENTERAL NUTRITION CONSULT NOTE - FOLLOW UP  Pharmacy Consult:  TPN Indication:  Intolerance to enteral feeding  Allergies  Allergen Reactions  . No Known Allergies    Patient Measurements: Height: '5\' 6"'  (167.6 cm) Weight: 156 lb 4.9 oz (70.9 kg) IBW/kg (Calculated) : 63.8  Vital Signs: Temp: 98.8 F (37.1 C) (08/31 0731) Temp Source: Oral (08/31 0731) BP: 144/97 (08/31 1000) Pulse Rate: 90 (08/31 0800) Intake/Output from previous day: 08/30 0701 - 08/31 0700 In: 2672.7 [I.V.:2522.7; IV Piggyback:150] Out: 9622 [Urine:2800; Drains:370; Stool:150] Intake/Output from this shift: Total I/O In: 60 [I.V.:60] Out: 1080 [Urine:600; Drains:430; Stool:50]  Labs: No results for input(s): WBC, HGB, HCT, PLT, APTT, INR in the last 72 hours.   Recent Labs  11/25/15 0410 11/27/15 0400  NA 133* 132*  K 3.7 3.7  CL 98* 101  CO2 28 24  GLUCOSE 135* 142*  BUN 14 15  CREATININE 0.86 0.86  CALCIUM 7.3* 7.5*  MG  --  1.9  PHOS  --  3.1  PROT  --  5.5*  ALBUMIN  --  1.5*  AST  --  20  ALT  --  19  ALKPHOS  --  141*  BILITOT  --  1.2   Estimated Creatinine Clearance: 77.3 mL/min (by C-G formula based on SCr of 0.86 mg/dL).   No results for input(s): GLUCAP in the last 72 hours.   Insulin Requirements in the past 24 hours:  SSI d/c'ed 11/24/15  Assessment [name] presented on 8/17/2017with fever, nausea and vomiting.  He was recently discharged after repair of a bleeding ulcer and was on TPN during that admission.  Repeat CT showed thickening of the gastrojejunostomy limb of his anastomosis as well as the duodenum.  MD reports minimal nutrition over the last couple of weeks and Pharmacy consulted to resume TPN.    Surgeries/Procedures: 06/2015 - perforated sigmoid diverticulitis s/p colostomy  8/25 - partial small bowel resection for perforation along incarcerated limb of hernia 8/3 - Billroth II repair, partial gastrectomy for giant ulcer  GI: Pepcid in TPN.   Prealbumin low at 5.  Drain O/P 370 mL, stool O/P 139m Endo: no hx DM - CBGs controlled Lytes: low Na, others WNL Renal: ureteral stone should pass w/o intervention per Urology - SCr 0.86 stable, CrCL 77 ml/min, good UOP 1.6 ml/kg/hr, NS 20K at 20 ml/hr, Flomax Pulm: extubated, stable on RA Cards: VSS, CVP 3 Hepatobil: LFTs WNL except alk phos, tbili / TG WNL Neuro: off sedation, GCS 15, pain score 0-5 on PRN Fentanyl ID: Zosyn/Fluc for bowel perf/peritonitis, s/p vanc - afebrile, WBC down 23.2, cultures NGTD.  ID team following patient as well. Best Practices: Lovenox, MC TPN Access: double lumen PICC 11/15/15 TPN start date: 11/16/15  Current Nutrition: TPN Full liquid diet  Nutritional Goals: 1778 kCal (will target 1750-1850), 85-100g protein per day   Plan: - Continue Clinimix E 5/15 at 80 ml/hr and lipids at 10 ml/hr.  TPN provides 1843 kCal and 96gm of protein daily, meeting 100% of patient's needs. - Daily multivitamin and trace elements - Pepcid 494mdaily in TPN - F/U PO intake and diet advancement to start weaning TPN   Trudy Kory D. DaMina MarblePharmD, BCPS Pager:  [phone]/31/2017, 10:19 AM

## 2015-11-28 MED ORDER — FAT EMULSION 20 % IV EMUL
240.0000 mL | INTRAVENOUS | Status: AC
  Administered 2015-11-28: 240 mL via INTRAVENOUS
  Filled 2015-11-28: qty 250

## 2015-11-28 MED ORDER — TRACE MINERALS CR-CU-MN-SE-ZN 10-1000-500-60 MCG/ML IV SOLN
INTRAVENOUS | Status: AC
  Administered 2015-11-28: 17:00:00 via INTRAVENOUS
  Filled 2015-11-28: qty 1992

## 2015-11-28 NOTE — Care Management Note (Signed)
Case Management Note  Patient Details  Name: Miguel [name] MRN: [id number] Date of Birth: [birth year]  Subjective/Objective:   Per staff RN, pt may need home health RN for wound care and PT recommends home therapy.  Per note in chart, home health services were arranged @ time of previous discharge but pt refused visits.  Discussed with pt and he states that he prefers to wait until closer to discharge to decide whether he needs home health or not.                             Expected Discharge Plan:  [place]  Discharge planning Services  CM Consult  Status of Service:  In process, will continue to follow  Girard Cooter, RN 11/28/2015, 9:54 AM

## 2015-11-28 NOTE — Progress Notes (Signed)
PARENTERAL NUTRITION CONSULT NOTE - FOLLOW UP  Pharmacy Consult:  TPN Indication:  Intolerance to enteral feeding  Allergies  Allergen Reactions  . No Known Allergies    Patient Measurements: Height: '5\' 6"'  (167.6 cm) Weight: 156 lb 4.9 oz (70.9 kg) IBW/kg (Calculated) : 63.8  Vital Signs: Temp: 98.2 F (36.8 C) (09/01 0320) Temp Source: Oral (09/01 0320) BP: 134/92 (09/01 0700) Intake/Output from previous day: 08/31 0701 - 09/01 0700 In: 3179 [I.V.:2629; IV Piggyback:550] Out: 4100 [Urine:3025; Drains:980; Stool:95] Intake/Output from this shift: No intake/output data recorded.   Insulin Requirements in the past 24 hours:  SSI d/c'ed 11/24/15  Assessment [name] presented on 8/17/2017with fever, nausea and vomiting.  He was recently discharged after repair of a bleeding ulcer and was on TPN during that admission.  Repeat CT showed thickening of the gastrojejunostomy limb of his anastomosis as well as the duodenum.  MD reports minimal nutrition over the last couple of weeks and Pharmacy consulted to resume TPN.    Surgeries/Procedures: 06/2015 - perforated sigmoid diverticulitis s/p colostomy  8/25 - partial small bowel resection for perforation along incarcerated limb of hernia 8/3 - Billroth II repair, partial gastrectomy for giant ulcer  GI: Pepcid in TPN. Albumin low at 1.5. Prealbumin low at 5.  Drain O/P increased to 1 L yesterday, stool O/P 42m. Continues on clears per surgery since had some nausea yesterday. Ordered diet seems to be full liquids? Endo: no hx DM. Last AM glucose ok at 135. Lytes: low Na, others wnl on 8/30. Renal: Ureteral stone should pass w/o intervention per Urology. SCr 0.86 stable, CrCL ~77mmin, good UOP 1.8 ml/kg/hr, NS 20K at 20 ml/hr, Flomax Pulm: extubated, stable on RA Cards: VSS, CVP 3 Hepatobil: LFTs WNL except alk phos, tbili / TG WNL Neuro: off sedation, GCS 15, pain score 0-5 on PRN Fentanyl ID: Zosyn/Fluc for bowel  perf/peritonitis, s/p vanc. Afebrile, WBC down 23.2 on 8/27, cultures show no growth. ID team following patient as well. Best Practices: Lovenox, MC TPN Access: double lumen PICC 11/15/15 TPN start date: 11/16/15  Current Nutrition: TPN Full liquid diet (Surgery says continue clears)  Nutritional Goals: per RD recs on 8/29 1800-2000 105-115g protein per day   Plan: Increase Clinimix E 5/15 to 8351mr Continue 20% lipid emulsion at 32m68m TPN + IVFE provides 100 g of protein and 1894 kCals per day meeting >90% of protein and 100% of kCal needs Continue NS with 20mE68m KCl at 20ml/32mdd Pecid 40mg d23m in TPN Add MVI and TE in TPN Monitor TPN labs, Bmet tomorrow F/U PO intake and ability to wean TPN  Miguel [name] Elenor QuinonesD, BCPS Clinical Pharmacist Pager [phone] 8:33 AM

## 2015-11-28 NOTE — Progress Notes (Signed)
Patient complained of nausea and no appetite. Dr. Rosendo Gros notified. Ordered NPO status and connect G-tube to gravity drainage. Will continue to monitor.

## 2015-11-28 NOTE — Progress Notes (Signed)
7 Days Post-Op  Subjective: Pt with some nausea with clears yesterday  Objective: Vital signs in last 24 hours: Temp:  [98.2 F (36.8 C)-99.5 F (37.5 C)] 98.2 F (36.8 C) (09/01 0320) Pulse Rate:  [90-95] 95 (08/31 1344) Resp:  [16-30] 24 (09/01 0700) BP: (133-158)/(75-98) 134/92 (09/01 0700) SpO2:  [95 %-98 %] 97 % (09/01 0700) Last BM Date: 11/26/15  Intake/Output from previous day: 08/31 0701 - 09/01 0700 In: 3179 [I.V.:2629; IV Piggyback:550] Out: 4100 [Urine:3025; Drains:980; Stool:95] Intake/Output this shift: No intake/output data recorded.  General appearance: alert and cooperative GI: soft, ostomy patent, midline incision c/d/i, D-tube bilious, JPx2- bilious and SS  Lab Results:  No results for input(s): WBC, HGB, HCT, PLT in the last 72 hours. BMET  Recent Labs  11/27/15 0400  NA 132*  K 3.7  CL 101  CO2 24  GLUCOSE 142*  BUN 15  CREATININE 0.86  CALCIUM 7.5*   PT/INR No results for input(s): LABPROT, INR in the last 72 hours. ABG No results for input(s): PHART, HCO3 in the last 72 hours.  Invalid input(s): PCO2, PO2  Studies/Results: No results found.  Anti-infectives: Anti-infectives    Start     Dose/Rate Route Frequency Ordered Stop   11/22/15 0500  vancomycin (VANCOCIN) IVPB 1000 mg/200 mL premix  Status:  Discontinued     1,000 mg 200 mL/hr over 60 Minutes Intravenous Every 8 hours 11/21/15 1950 11/24/15 1022   11/21/15 2030  vancomycin (VANCOCIN) IVPB 1000 mg/200 mL premix     1,000 mg 200 mL/hr over 60 Minutes Intravenous STAT 11/21/15 1950 11/22/15 0051   11/21/15 1130  piperacillin-tazobactam (ZOSYN) IVPB 3.375 g  Status:  Discontinued     3.375 g 12.5 mL/hr over 240 Minutes Intravenous To Surgery 11/21/15 1128 11/21/15 1403   11/19/15 0400  vancomycin (VANCOCIN) IVPB 750 mg/150 ml premix  Status:  Discontinued     750 mg 150 mL/hr over 60 Minutes Intravenous Every 12 hours 11/18/15 1459 11/21/15 1950   11/18/15 1600  fluconazole  (DIFLUCAN) IVPB 400 mg     400 mg 100 mL/hr over 120 Minutes Intravenous Every 24 hours 11/18/15 1452 11/28/15 2359   11/18/15 1530  vancomycin (VANCOCIN) 1,250 mg in sodium chloride 0.9 % 250 mL IVPB     1,250 mg 166.7 mL/hr over 90 Minutes Intravenous  Once 11/18/15 1452 11/18/15 1940   11/14/15 0600  piperacillin-tazobactam (ZOSYN) IVPB 3.375 g     3.375 g 12.5 mL/hr over 240 Minutes Intravenous Every 8 hours 11/13/15 2328 11/28/15 2359   11/13/15 2300  piperacillin-tazobactam (ZOSYN) IVPB 3.375 g     3.375 g 100 mL/hr over 30 Minutes Intravenous  Once 11/13/15 2257 11/14/15 0217      Assessment/Plan: s/p Procedure(s): REDUCTION OF HIATAL HERNIA , REPAIR HIATAL HERNIA, RESECTION SMALL BOWEL WITH ANASTOMOSIS, PLACEMENT GASTROSTOMY TUBE, PLACEMENT DUODENOSTOMY TUBE (N/A)  Con't with clears for now, con't to keep G-tube capped as long as tol PO with no n/v Mobilize   LOS: 14 days    Rosario Jacks., Anne Hahn 11/28/2015

## 2015-11-28 NOTE — Consult Note (Signed)
   [facility] CM Inpatient Consult   11/28/2015  Miguel [name] 1951-01-28 [id number]   Link to [facility] Care Management follow up visit for [employer] employees/dependents with [facility] insurance. Spoke with [name] at bedside. He was up in recliner in ICU. Will continue to follow along. He will receive post hospital discharge call upon discharge.   [name], MSN-Ed, RN,BSN [facility] Liaison [phone]

## 2015-11-29 LAB — BASIC METABOLIC PANEL
ANION GAP: 9 (ref 5–15)
BUN: 16 mg/dL (ref 6–20)
CHLORIDE: 101 mmol/L (ref 101–111)
CO2: 22 mmol/L (ref 22–32)
Calcium: 7.7 mg/dL — ABNORMAL LOW (ref 8.9–10.3)
Creatinine, Ser: 0.86 mg/dL (ref 0.61–1.24)
GFR calc non Af Amer: >60 mL/min (ref >60)
Glucose, Bld: 122 mg/dL — ABNORMAL HIGH (ref 65–99)
POTASSIUM: 4 mmol/L (ref 3.5–5.1)
SODIUM: 132 mmol/L — AB (ref 135–145)

## 2015-11-29 MED ORDER — ONDANSETRON HCL 4 MG/2ML IJ SOLN
4.0000 mg | Freq: Four times a day (QID) | INTRAMUSCULAR | Status: DC | PRN
  Administered 2015-11-29 – 2015-12-03 (×6): 4 mg via INTRAVENOUS
  Filled 2015-11-29 (×6): qty 2

## 2015-11-29 MED ORDER — FAT EMULSION 20 % IV EMUL
240.0000 mL | INTRAVENOUS | Status: AC
  Administered 2015-11-29: 240 mL via INTRAVENOUS
  Filled 2015-11-29: qty 250

## 2015-11-29 MED ORDER — TRACE MINERALS CR-CU-MN-SE-ZN 10-1000-500-60 MCG/ML IV SOLN
INTRAVENOUS | Status: AC
  Administered 2015-11-29: 17:00:00 via INTRAVENOUS
  Filled 2015-11-29: qty 1992

## 2015-11-29 NOTE — Progress Notes (Signed)
[facility] Progress Note 8 Days Post-Op  Subjective: Patient is sitting up in the chair and looks good.  Not a lot out of the G-tube which has been open for almost 24 hours.  Objective: Vital signs in last 24 hours: Temp:  [98.1 F (36.7 C)-98.8 F (37.1 C)] 98.4 F (36.9 C) (09/02 0738) Pulse Rate:  [92] 92 (09/02 0800) Resp:  [18-25] 23 (09/02 0800) BP: (135-164)/(81-104) 148/89 (09/02 0800) SpO2:  [87 %-100 %] 91 % (09/02 0800) Last BM Date:  (Colostomy)  Intake/Output from previous day: 09/01 0701 - 09/02 0700 In: 3082 [P.O.:100; I.V.:2682; IV Piggyback:300] Out: [id number] [Urine:2450; Drains:1060; Stool:75] Intake/Output this shift: Total I/O In: 113 [I.V.:113] Out: -   General: No acute distress.  Lungs: Clear to auscultation  Abd: Soft, good bowel sounds.  Right mid drain is putting out the most.  Bilious  Extremities: No changes  Neuro: Intact  Lab Results:  @LABLAST2 (wbc:2,hgb:2,hct:2,plt:2) BMET ) Recent Labs  11/27/15 0400 11/29/15 0500  NA 132* 132*  K 3.7 4.0  CL 101 101  CO2 24 22  GLUCOSE 142* 122*  BUN 15 16  CREATININE 0.86 0.86  CALCIUM 7.5* 7.7*   PT/INR No results for input(s): LABPROT, INR in the last 72 hours. ABG No results for input(s): PHART, HCO3 in the last 72 hours.  Invalid input(s): PCO2, PO2  Studies/Results: No results found.  Anti-infectives: Anti-infectives    Start     Dose/Rate Route Frequency Ordered Stop   11/22/15 0500  vancomycin (VANCOCIN) IVPB 1000 mg/200 mL premix  Status:  Discontinued     1,000 mg 200 mL/hr over 60 Minutes Intravenous Every 8 hours 11/21/15 1950 11/24/15 1022   11/21/15 2030  vancomycin (VANCOCIN) IVPB 1000 mg/200 mL premix     1,000 mg 200 mL/hr over 60 Minutes Intravenous STAT 11/21/15 1950 11/22/15 0051   11/21/15 1130  piperacillin-tazobactam (ZOSYN) IVPB 3.375 g  Status:  Discontinued     3.375 g 12.5 mL/hr over 240 Minutes Intravenous To Surgery 11/21/15 1128 11/21/15 1403   11/19/15 0400  vancomycin (VANCOCIN) IVPB 750 mg/150 ml premix  Status:  Discontinued     750 mg 150 mL/hr over 60 Minutes Intravenous Every 12 hours 11/18/15 1459 11/21/15 1950   11/18/15 1600  fluconazole (DIFLUCAN) IVPB 400 mg     400 mg 100 mL/hr over 120 Minutes Intravenous Every 24 hours 11/18/15 1452 11/28/15 1900   11/18/15 1530  vancomycin (VANCOCIN) 1,250 mg in sodium chloride 0.9 % 250 mL IVPB     1,250 mg 166.7 mL/hr over 90 Minutes Intravenous  Once 11/18/15 1452 11/18/15 1940   11/14/15 0600  piperacillin-tazobactam (ZOSYN) IVPB 3.375 g     3.375 g 12.5 mL/hr over 240 Minutes Intravenous Every 8 hours 11/13/15 2328 11/28/15 2359   11/13/15 2300  piperacillin-tazobactam (ZOSYN) IVPB 3.375 g     3.375 g 100 mL/hr over 30 Minutes Intravenous  Once 11/13/15 2257 11/14/15 0217      Assessment/Plan: s/p Procedure(s): REDUCTION OF HIATAL HERNIA , REPAIR HIATAL HERNIA, RESECTION SMALL BOWEL WITH ANASTOMOSIS, PLACEMENT GASTROSTOMY TUBE, PLACEMENT DUODENOSTOMY TUBE Advance diet Start on sips of clear liquids.  Clamp G-tube Zofran  LOS: 15 days   Kathryne Eriksson. Dahlia Bailiff, MD, FACS [phone] [facility] 11/29/2015

## 2015-11-29 NOTE — Progress Notes (Signed)
PARENTERAL NUTRITION CONSULT NOTE - FOLLOW UP  Pharmacy Consult:  TPN Indication:  Intolerance to enteral feeding  Allergies  Allergen Reactions  . No Known Allergies    Patient Measurements: Height: _0  (167.6 cm) Weight: 156 lb 4.9 oz (70.9 kg) IBW/kg (Calculated) : 63.8  Vital Signs: Temp: 98.4 F (36.9 C) (09/02 0738) Temp Source: Oral (09/02 0738) BP: 140/87 (09/02 0600) Intake/Output from previous day: 09/01 0701 - 09/02 0700 In: 2969 [P.O.:100; I.V.:2569; IV Piggyback:300] Out: 4035 [Urine:2450; Drains:1060; Stool:75] Intake/Output from this shift: No intake/output data recorded.   Insulin Requirements in the past 24 hours:  SSI d/c'ed 11/24/15  Assessment [name] presented on 8/17/2017with fever, nausea and vomiting.  He was recently discharged after repair of a bleeding ulcer and was on TPN during that admission.  Repeat CT showed thickening of the gastrojejunostomy limb of his anastomosis as well as the duodenum.  MD reports minimal nutrition over the last couple of weeks and Pharmacy consulted to resume TPN.    Surgeries/Procedures: 06/2015 - perforated sigmoid diverticulitis s/p colostomy  8/25 - partial small bowel resection for perforation along incarcerated limb of hernia 8/3 - Billroth II repair, partial gastrectomy for giant ulcer  GI: Pepcid in TPN. Albumin low at 1.5. Prealbumin low at 5.  Drain O/P remains around 1 L yesterday, stool O/P 30m. Was on clears / full liquids yesterday but had nausea and no appetite so surgery put him back to NPO and connected G-tube.  Endo: no hx DM. Last AM glucose ok at 122 Lytes: low Na, others wnl. CoCa 9.7 Renal: Ureteral stone should pass w/o intervention per Urology. SCr 0.86 stable, CrCL ~754mmin, good UOP 1.4 ml/kg/hr, NS 20K at 20 ml/hr, Flomax Pulm: extubated, stable on RA Cards: VSS, CVP 3 Hepatobil: LFTs WNL except alk phos, tbili / TG WNL Neuro: off sedation, GCS 15, pain score 0-5 on PRN Fentanyl ID:  S/p Zosyn/Fluc for bowel perf/peritonitis. Afebrile, WBC down 23.2 on 8/27, cultures show no growth. ID team following patient as well. Best Practices: Lovenox, MC TPN Access: double lumen PICC 11/15/15 TPN start date: 11/16/15  Current Nutrition: TPN NPO  Nutritional Goals: per RD recs on 8/29 1800-2000 105-115g protein per day  Plan: Continue Clinimix E 5/15 at 8374mr Continue 20% lipid emulsion at 1m5m TPN + IVFE provides 100 g of protein and 1894 kCals per day meeting >90% of protein and 100% of kCal needs Continue NS with 20mE23m KCl at 20ml/43mdd Pecid 40mg d13m in TPN Add MVI and TE in TPN Monitor TPN labs F/U advancement in diet and ability to wean TPN  Jearlean Demauro Elenor QuinonesD, BCPS Clinical Pharmacist Pager [phone] 7:44 AM

## 2015-11-30 LAB — CBC WITH DIFFERENTIAL/PLATELET
BASOS PCT: 0 %
Basophils Absolute: 0 10*3/uL (ref 0.0–0.1)
EOS PCT: 3 %
Eosinophils Absolute: 0.4 10*3/uL (ref 0.0–0.7)
HEMATOCRIT: 24.5 % — AB (ref 39.0–52.0)
HEMOGLOBIN: 7.7 g/dL — AB (ref 13.0–17.0)
LYMPHS PCT: 8 %
Lymphs Abs: 1.1 10*3/uL (ref 0.7–4.0)
MCH: 27.4 pg (ref 26.0–34.0)
MCHC: 31.4 g/dL (ref 30.0–36.0)
MCV: 87.2 fL (ref 78.0–100.0)
MONOS PCT: 7 %
Monocytes Absolute: 1 10*3/uL (ref 0.1–1.0)
NEUTROS ABS: 11.4 10*3/uL — AB (ref 1.7–7.7)
NEUTROS PCT: 82 %
PLATELETS: 530 10*3/uL — AB (ref 150–400)
RBC: 2.81 MIL/uL — ABNORMAL LOW (ref 4.22–5.81)
RDW: 17.1 % — ABNORMAL HIGH (ref 11.5–15.5)
WBC: 13.9 10*3/uL — ABNORMAL HIGH (ref 4.0–10.5)

## 2015-11-30 MED ORDER — TRACE MINERALS CR-CU-MN-SE-ZN 10-1000-500-60 MCG/ML IV SOLN
INTRAVENOUS | Status: AC
  Administered 2015-11-30: 19:00:00 via INTRAVENOUS
  Filled 2015-11-30: qty 1992

## 2015-11-30 MED ORDER — FAT EMULSION 20 % IV EMUL
240.0000 mL | INTRAVENOUS | Status: AC
Start: 2015-11-30 — End: 2015-12-01
  Administered 2015-11-30: 240 mL via INTRAVENOUS
  Filled 2015-11-30: qty 250

## 2015-11-30 NOTE — Progress Notes (Signed)
Patient was transferred to [phone] with no distress noted. Family aware of move. Will transfer care at this time.

## 2015-11-30 NOTE — Progress Notes (Signed)
PARENTERAL NUTRITION CONSULT NOTE - FOLLOW UP  Pharmacy Consult:  TPN Indication:  Intolerance to enteral feeding  Allergies  Allergen Reactions  . No Known Allergies    Patient Measurements: Height: '5\' 6"'  (167.6 cm) Weight: 156 lb 4.9 oz (70.9 kg) IBW/kg (Calculated) : 63.8  Vital Signs: Temp: 98.5 F (36.9 C) (09/03 0425) Temp Source: Oral (09/03 0425) BP: 136/82 (09/03 0700) Intake/Output from previous day: 09/02 0701 - 09/03 0700 In: 2974 [P.O.:355; I.V.:2619] Out: 2240 [YOVZC:5885; Drains:695; Stool:100] Intake/Output from this shift: No intake/output data recorded.   Insulin Requirements in the past 24 hours:  SSI d/c'ed 11/24/15  Assessment [name] presented on 8/17/2017with fever, nausea and vomiting.  He was recently discharged after repair of a bleeding ulcer and was on TPN during that admission.  Repeat CT showed thickening of the gastrojejunostomy limb of his anastomosis as well as the duodenum.  MD reports minimal nutrition over the last couple of weeks and Pharmacy consulted to resume TPN.    Surgeries/Procedures: 06/2015 - perforated sigmoid diverticulitis s/p colostomy  8/25 - partial small bowel resection for perforation along incarcerated limb of hernia 8/3 - Billroth II repair, partial gastrectomy for giant ulcer  GI: Pepcid in TPN. Albumin low at 1.5. Prealbumin low at 5. Drain O/P down to 765m yesterday, stool O/P 1031m Was previously on clears / full liquids but had nausea and no appetite so surgery put him back to NPO and connected G-tube. Now back to clears but poor appetite. Endo: no hx DM. Last AM glucose ok at 122 Lytes: low Na, others wnl. CoCa 9.7 Renal: Ureteral stone should pass w/o intervention per Urology. SCr 0.86 stable, CrCl ~7543min, UOP down to 0.8ml15m/hr, NS 20K at 20 ml/hr, Flomax Pulm: extubated, stable on RA Cards: VSS Hepatobil: LFTs WNL except alk phos, tbili / TG WNL Neuro: off sedation, GCS 15, pain score 0-5 on PRN  Fentanyl ID: S/p Zosyn/Fluc for bowel perf/peritonitis. Afebrile, WBC down to 13.9, cultures show no growth. ID team following patient as well. Best Practices: Lovenox, MC TPN Access: double lumen PICC 11/15/15 TPN start date: 11/16/15  Current Nutrition: TPN Clear liquids  Nutritional Goals: per RD recs on 8/29 1800-2000 105-115g protein per day  Plan: Continue Clinimix E 5/15 at 83ml44mContinue 20% lipid emulsion at 10ml/1mPN + IVFE provides 100 g of protein and 1894 kCals per day meeting >90% of protein and 100% of kCal needs Continue NS with 20mEq 64mCl at 20ml/hr43m Pecid 40mg dai34mn TPN Add MVI and TE in TPN Monitor TPN labs F/U advancement in diet and ability to wean TPN  Miguel [name] BaElenor [name] BCPS Clinical Pharmacist Pager [phone] 10:06 AM

## 2015-11-30 NOTE — Progress Notes (Signed)
9 Days Post-Op  Subjective: No complaints Poor appetite Minimal pain  Objective: Vital signs in last 24 hours: Temp:  [98 F (36.7 C)-98.5 F (36.9 C)] 98.5 F (36.9 C) (09/03 0425) Resp:  [12-22] 17 (09/03 0700) BP: (117-146)/(73-103) 136/82 (09/03 0700) SpO2:  [58 %-100 %] 99 % (09/03 0700) Last BM Date:  (Colostomy)  Intake/Output from previous day: 09/02 0701 - 09/03 0700 In: 2974 [P.O.:355; I.V.:2619] Out: 2240 [id number]; Drains:695; Stool:100] Intake/Output this shift: No intake/output data recorded.  Exam: Looks comfortable Lungs clear Abdomen soft Drains working well   Lab Results:   Recent Labs  11/30/15 0400  WBC 13.9*  HGB 7.7*  HCT 24.5*  PLT 530*   BMET  Recent Labs  11/29/15 0500  NA 132*  K 4.0  CL 101  CO2 22  GLUCOSE 122*  BUN 16  CREATININE 0.86  CALCIUM 7.7*   PT/INR No results for input(s): LABPROT, INR in the last 72 hours. ABG No results for input(s): PHART, HCO3 in the last 72 hours.  Invalid input(s): PCO2, PO2  Studies/Results: No results found.  Anti-infectives: Anti-infectives    Start     Dose/Rate Route Frequency Ordered Stop   11/22/15 0500  vancomycin (VANCOCIN) IVPB 1000 mg/200 mL premix  Status:  Discontinued     1,000 mg 200 mL/hr over 60 Minutes Intravenous Every 8 hours 11/21/15 1950 11/24/15 1022   11/21/15 2030  vancomycin (VANCOCIN) IVPB 1000 mg/200 mL premix     1,000 mg 200 mL/hr over 60 Minutes Intravenous STAT 11/21/15 1950 11/22/15 0051   11/21/15 1130  piperacillin-tazobactam (ZOSYN) IVPB 3.375 g  Status:  Discontinued     3.375 g 12.5 mL/hr over 240 Minutes Intravenous To Surgery 11/21/15 1128 11/21/15 1403   11/19/15 0400  vancomycin (VANCOCIN) IVPB 750 mg/150 ml premix  Status:  Discontinued     750 mg 150 mL/hr over 60 Minutes Intravenous Every 12 hours 11/18/15 1459 11/21/15 1950   11/18/15 1600  fluconazole (DIFLUCAN) IVPB 400 mg     400 mg 100 mL/hr over 120 Minutes Intravenous Every  24 hours 11/18/15 1452 11/28/15 1900   11/18/15 1530  vancomycin (VANCOCIN) 1,250 mg in sodium chloride 0.9 % 250 mL IVPB     1,250 mg 166.7 mL/hr over 90 Minutes Intravenous  Once 11/18/15 1452 11/18/15 1940   11/14/15 0600  piperacillin-tazobactam (ZOSYN) IVPB 3.375 g     3.375 g 12.5 mL/hr over 240 Minutes Intravenous Every 8 hours 11/13/15 2328 11/28/15 2359   11/13/15 2300  piperacillin-tazobactam (ZOSYN) IVPB 3.375 g     3.375 g 100 mL/hr over 30 Minutes Intravenous  Once 11/13/15 2257 11/14/15 0217      Assessment/Plan: s/p Procedure(s): REDUCTION OF HIATAL HERNIA , REPAIR HIATAL HERNIA, RESECTION SMALL BOWEL WITH ANASTOMOSIS, PLACEMENT GASTROSTOMY TUBE, PLACEMENT DUODENOSTOMY TUBE (N/A)  Continue TNA Wound care transver to floor (6N)  LOS: 16 days    Miguel [name] A 11/30/2015

## 2015-12-01 LAB — CBC
HEMATOCRIT: 27.4 % — AB (ref 39.0–52.0)
HEMOGLOBIN: 8.5 g/dL — AB (ref 13.0–17.0)
MCH: 27.2 pg (ref 26.0–34.0)
MCHC: 31 g/dL (ref 30.0–36.0)
MCV: 87.5 fL (ref 78.0–100.0)
Platelets: 545 10*3/uL — ABNORMAL HIGH (ref 150–400)
RBC: 3.13 MIL/uL — ABNORMAL LOW (ref 4.22–5.81)
RDW: 17.2 % — AB (ref 11.5–15.5)
WBC: 14 10*3/uL — ABNORMAL HIGH (ref 4.0–10.5)

## 2015-12-01 LAB — COMPREHENSIVE METABOLIC PANEL
ALK PHOS: 352 U/L — AB (ref 38–126)
ALT: 32 U/L (ref 17–63)
AST: 19 U/L (ref 15–41)
Albumin: 1.7 g/dL — ABNORMAL LOW (ref 3.5–5.0)
Anion gap: 6 (ref 5–15)
BILIRUBIN TOTAL: 0.6 mg/dL (ref 0.3–1.2)
BUN: 18 mg/dL (ref 6–20)
CALCIUM: 7.8 mg/dL — AB (ref 8.9–10.3)
CHLORIDE: 101 mmol/L (ref 101–111)
CO2: 25 mmol/L (ref 22–32)
CREATININE: 0.72 mg/dL (ref 0.61–1.24)
Glucose, Bld: 123 mg/dL — ABNORMAL HIGH (ref 65–99)
Potassium: 4.3 mmol/L (ref 3.5–5.1)
Sodium: 132 mmol/L — ABNORMAL LOW (ref 135–145)
Total Protein: 5.7 g/dL — ABNORMAL LOW (ref 6.5–8.1)

## 2015-12-01 LAB — DIFFERENTIAL
BASOS PCT: 0 %
Basophils Absolute: 0 10*3/uL (ref 0.0–0.1)
EOS PCT: 3 %
Eosinophils Absolute: 0.4 10*3/uL (ref 0.0–0.7)
Lymphocytes Relative: 8 %
Lymphs Abs: 1.1 10*3/uL (ref 0.7–4.0)
MONO ABS: 1.2 10*3/uL — AB (ref 0.1–1.0)
MONOS PCT: 8 %
NEUTROS ABS: 11.3 10*3/uL — AB (ref 1.7–7.7)
Neutrophils Relative %: 81 %

## 2015-12-01 LAB — PREALBUMIN: Prealbumin: 18.7 mg/dL (ref 18–38)

## 2015-12-01 LAB — PHOSPHORUS: Phosphorus: 3.2 mg/dL (ref 2.5–4.6)

## 2015-12-01 LAB — MAGNESIUM: MAGNESIUM: 2 mg/dL (ref 1.7–2.4)

## 2015-12-01 LAB — TRIGLYCERIDES: Triglycerides: 72 mg/dL (ref <150)

## 2015-12-01 MED ORDER — FAT EMULSION 20 % IV EMUL
240.0000 mL | INTRAVENOUS | Status: AC
  Administered 2015-12-01: 240 mL via INTRAVENOUS
  Filled 2015-12-01: qty 250

## 2015-12-01 MED ORDER — TRACE MINERALS CR-CU-MN-SE-ZN 10-1000-500-60 MCG/ML IV SOLN
INTRAVENOUS | Status: AC
  Administered 2015-12-01: 17:00:00 via INTRAVENOUS
  Filled 2015-12-01: qty 1992

## 2015-12-01 NOTE — Progress Notes (Signed)
10 Days Post-Op  Subjective: Appetite better.  Beginning to ambulate.  Pain well controlled Colostomy working with some stool output. Tolerating clear liquids Gastrostomy tube clamped Duodenostomy tube draining bilious fluid JP drainage thin and nonbilious  Objective: Vital signs in last 24 hours: Temp:  [98.3 F (36.8 C)-99.8 F (37.7 C)] 98.3 F (36.8 C) (09/04 0423) Pulse Rate:  [89-98] 89 (09/04 0423) Resp:  [16-18] 18 (09/04 0423) BP: (122-142)/(73-77) 122/73 (09/04 0423) SpO2:  [95 %-100 %] 95 % (09/04 0423) Last BM Date: 11/30/15 (small amount)  Intake/Output from previous day: 09/03 0701 - 09/04 0700 In: 2020.9 [I.V.:2020.9] Out: 3155 [Urine:2700; Drains:445; Stool:10] Intake/Output this shift: Total I/O In: -  Out: 180 [Urine:180]  General appearance: Alert.  Appropriate.  Wife present.  No distress. Resp: clear to auscultation bilaterally GI: Abdomen soft.  Nondistended.  Gastrostomy tube left upper quadrant clamped.  Colostomy left lower quadrant pain can healthy with stool in bag.  Do an ostomy tube right upper quadrant with bilious drainage JP drains right upper quadrant with serosanguineous nonbilious drainage.  Lab Results:  Results for orders placed or performed during the hospital encounter of 11/13/15 (from the past 24 hour(s))  Comprehensive metabolic panel     Status: Abnormal   Collection Time: 12/01/15  5:35 AM  Result Value Ref Range   Sodium 132 (L) 135 - 145 mmol/L   Potassium 4.3 3.5 - 5.1 mmol/L   Chloride 101 101 - 111 mmol/L   CO2 25 22 - 32 mmol/L   Glucose, Bld 123 (H) 65 - 99 mg/dL   BUN 18 6 - 20 mg/dL   Creatinine, Ser 0.72 0.61 - 1.24 mg/dL   Calcium 7.8 (L) 8.9 - 10.3 mg/dL   Total Protein 5.7 (L) 6.5 - 8.1 g/dL   Albumin 1.7 (L) 3.5 - 5.0 g/dL   AST 19 15 - 41 U/L   ALT 32 17 - 63 U/L   Alkaline Phosphatase 352 (H) 38 - 126 U/L   Total Bilirubin 0.6 0.3 - 1.2 mg/dL   GFR calc non Af Amer >60 >60 mL/min   GFR calc Af Amer >60  >60 mL/min   Anion gap 6 5 - 15  Magnesium     Status: None   Collection Time: 12/01/15  5:35 AM  Result Value Ref Range   Magnesium 2.0 1.7 - 2.4 mg/dL  Phosphorus     Status: None   Collection Time: 12/01/15  5:35 AM  Result Value Ref Range   Phosphorus 3.2 2.5 - 4.6 mg/dL  Differential     Status: Abnormal   Collection Time: 12/01/15  5:35 AM  Result Value Ref Range   Neutrophils Relative % 81 %   Neutro Abs 11.3 (H) 1.7 - 7.7 K/uL   Lymphocytes Relative 8 %   Lymphs Abs 1.1 0.7 - 4.0 K/uL   Monocytes Relative 8 %   Monocytes Absolute 1.2 (H) 0.1 - 1.0 K/uL   Eosinophils Relative 3 %   Eosinophils Absolute 0.4 0.0 - 0.7 K/uL   Basophils Relative 0 %   Basophils Absolute 0.0 0.0 - 0.1 K/uL  Prealbumin     Status: None   Collection Time: 12/01/15  5:35 AM  Result Value Ref Range   Prealbumin 18.7 18 - 38 mg/dL  CBC     Status: Abnormal   Collection Time: 12/01/15  5:35 AM  Result Value Ref Range   WBC 14.0 (H) 4.0 - 10.5 K/uL   RBC 3.13 (L) 4.22 -  5.81 MIL/uL   Hemoglobin 8.5 (L) 13.0 - 17.0 g/dL   HCT 27.4 (L) 39.0 - 52.0 %   MCV 87.5 78.0 - 100.0 fL   MCH 27.2 26.0 - 34.0 pg   MCHC 31.0 30.0 - 36.0 g/dL   RDW 17.2 (H) 11.5 - 15.5 %   Platelets 545 (H) 150 - 400 K/uL  Triglycerides     Status: None   Collection Time: 12/01/15  5:35 AM  Result Value Ref Range   Triglycerides 72 <150 mg/dL     Studies/Results: No results found.  . chlorhexidine  15 mL Mouth Rinse BID  . enoxaparin (LOVENOX) injection  40 mg Subcutaneous Q24H  . mouth rinse  15 mL Mouth Rinse q12n4p  . sodium chloride flush  10-40 mL Intracatheter Q12H     Assessment/Plan: s/p Procedure(s): REDUCTION OF HIATAL HERNIA , REPAIR HIATAL HERNIA, RESECTION SMALL BOWEL WITH ANASTOMOSIS, PLACEMENT GASTROSTOMY TUBE, PLACEMENT DUODENOSTOMY TUBE  Slow steady progress Continue TNA Continue wound care Leave G-tube clamped. Full liquid diet Ambulate more  @PROBHOSP @  LOS: 17 days     Maude Hettich M 12/01/2015  . .prob

## 2015-12-01 NOTE — Progress Notes (Signed)
Physical Therapy Treatment Patient Details Name: Miguel [name] MRN: [id number] DOB: [birth year] Today's Date: 12/01/2015    History of Present Illness Patient is a 65 yo male admitted 11/13/15 with fever, confusion following partial gastrectomy on 10/30/15.  Patient s/p repair of bowel obstruction, found perforation requiring additional resection.  Post-op acute resp failure and intubated 11/20/15.  Extubated 11/22/15.    PMH:  partial gastrectomy 10/30/15, anemia, anxiety, HTN    PT Comments    Pt performed increased mobility and progressing well in POC.  Will try gait next visit without RW.  Pt and spouse agreeable to continue ambulation on the unit.    Follow Up Recommendations  Home health PT;Supervision - Intermittent     Equipment Recommendations  Rolling walker with 5" wheels    Recommendations for Other Services       Precautions / Restrictions Precautions Precautions: Fall Precaution Comments: multiple drains.   Restrictions Weight Bearing Restrictions: No    Mobility  Bed Mobility               General bed mobility comments: Pt sitting on edge of bed on arrival.    Transfers Overall transfer level: Needs assistance Equipment used: Rolling walker (2 wheeled) Transfers: Sit to/from Stand Sit to Stand: Supervision         General transfer comment: Cues for sequencing and hand placement to push from seated surface.    Ambulation/Gait Ambulation/Gait assistance: Supervision Ambulation Distance (Feet): 850 Feet Assistive device: Rolling walker (2 wheeled) Gait Pattern/deviations: Step-through pattern;Decreased stride length;Trunk flexed Gait velocity: decreased Gait velocity interpretation: Below normal speed for age/gender General Gait Details: Cues for upper trunk control and scapular retraction.  Cues for close proximity to RW.  Plan to attempt gait without device next visit.  Pt has good endurance and educated spouse to continue mobility with patient.      Stairs            Wheelchair Mobility    Modified Rankin (Stroke Patients Only)       Balance Overall balance assessment: Needs assistance   Sitting balance-Leahy Scale: Good       Standing balance-Leahy Scale: Fair                      Cognition Arousal/Alertness: Awake/alert Behavior During Therapy: WFL for tasks assessed/performed Overall Cognitive Status: Within Functional Limits for tasks assessed                      Exercises      General Comments        Pertinent Vitals/Pain Pain Assessment: No/denies pain Pain Score: 6  Faces Pain Scale: Hurts little more Pain Location: abdomen Pain Descriptors / Indicators: Discomfort;Operative site guarding Pain Intervention(s): Monitored during session;Repositioned    Home Living                      Prior Function            PT Goals (current goals can now be found in the care plan section) Acute Rehab PT Goals Patient Stated Goal: Decrease pain Potential to Achieve Goals: Good Progress towards PT goals: Progressing toward goals    Frequency  Min 3X/week    PT Plan Current plan remains appropriate    Co-evaluation             End of Session Equipment Utilized During Treatment: Gait belt (placed high.  ) Activity Tolerance: Patient  tolerated treatment well Patient left: in chair;with call bell/phone within reach;with family/visitor present     Time: [id number] PT Time Calculation (min) (ACUTE ONLY): 26 min  Charges:  $Gait Training: 23-37 mins                    G Codes:      Cristela Blue 12-30-2015, 12:11 PM  Governor Rooks, PTA pager [phone]

## 2015-12-01 NOTE — Progress Notes (Signed)
PARENTERAL NUTRITION CONSULT NOTE - FOLLOW UP  Pharmacy Consult:  TPN Indication:  Intolerance to enteral feeding  Allergies  Allergen Reactions  . No Known Allergies    Patient Measurements: Height: _0  (167.6 cm) Weight: 156 lb 4.9 oz (70.9 kg) IBW/kg (Calculated) : 63.8  Vital Signs: Temp: 98.3 F (36.8 C) (09/04 0423) Temp Source: Oral (09/04 0423) BP: 122/73 (09/04 0423) Pulse Rate: 89 (09/04 0423) Intake/Output from previous day: 09/03 0701 - 09/04 0700 In: 2020.9 [I.V.:2020.9] Out: 8614 [Urine:2700; Drains:445; Stool:10] Intake/Output from this shift: No intake/output data recorded.   Insulin Requirements in the past 24 hours:  SSI d/c'ed 11/24/15  Assessment [name] presented on 8/17/2017with fever, nausea and vomiting.  He was recently discharged after repair of a bleeding ulcer and was on TPN during that admission.  Repeat CT showed thickening of the gastrojejunostomy limb of his anastomosis as well as the duodenum.  MD reports minimal nutrition over the last couple of weeks and Pharmacy consulted to resume TPN.    Surgeries/Procedures: 06/2015 - perforated sigmoid diverticulitis s/p colostomy  8/25 - partial small bowel resection for perforation along incarcerated limb of hernia 8/3 - Billroth II repair, partial gastrectomy for giant ulcer  GI: Pepcid in TPN. Albumin up to 1.7. Prealbumin improved at 18.7. Drain O/P down to 442m yesterday, stool O/P down to 126myesterday. Was previously on clears / full liquids but had nausea and no appetite so surgery changed to NPO and connected G-tube. As of 9/2, back to clears but poor appetite. Endo: no hx DM. Last AM glucose ok at 123 Lytes: low Na (stable at 132), others wnl. CoCa 9.6 Renal: Ureteral stone should pass w/o intervention per Urology. SCr 0.72 stable, CrCl ~8020min, UOP down to 0.8ml55m/hr, NS 20K at 20 ml/hr, Flomax Pulm: extubated, stable on 2L [place] Cards: VSS Hepatobil: LFTs WNL except alk phos  trending up, tbili / TG WNL Neuro: off sedation, GCS 15, pain score 0-5 on PRN Fentanyl ID: S/p Zosyn/Fluc for bowel perf/peritonitis. Afebrile, WBC down to 14, cultures show no growth. ID team following patient as well. Best Practices: Lovenox, MC TPN Access: double lumen PICC 11/15/15 TPN start date: 11/16/15  Current Nutrition: TPN Clear liquids  Nutritional Goals: per RD recs on 8/29 1800-2000 105-115g protein per day  Plan: Continue Clinimix E 5/15 at 83ml59mContinue 20% lipid emulsion at 10ml/58mPN + IVFE provides 100 g of protein and 1894 kCals per day meeting >90% of protein and 100% of kCal needs Continue NS with 20mEq 68mCl at 20ml/hr73m Pecid 40mg dai9mn TPN Add MVI and TE in TPN Monitor TPN labs F/U advancement in diet and ability to wean TPN  Kadince Boxley MSloan Leiter BCPS Clinical Pharmacist [phone] 9:14 AM

## 2015-12-02 MED ORDER — TRACE MINERALS CR-CU-MN-SE-ZN 10-1000-500-60 MCG/ML IV SOLN
INTRAVENOUS | Status: AC
  Administered 2015-12-02: 18:00:00 via INTRAVENOUS
  Filled 2015-12-02: qty 1992

## 2015-12-02 MED ORDER — FAT EMULSION 20 % IV EMUL
240.0000 mL | INTRAVENOUS | Status: AC
  Administered 2015-12-02: 240 mL via INTRAVENOUS
  Filled 2015-12-02: qty 250

## 2015-12-02 NOTE — Progress Notes (Signed)
PARENTERAL NUTRITION CONSULT NOTE - FOLLOW UP  Pharmacy Consult:  TPN Indication:  Intolerance to enteral feeding  Allergies  Allergen Reactions  . No Known Allergies    Patient Measurements: Height: _0  (167.6 cm) Weight: 156 lb 4.9 oz (70.9 kg) IBW/kg (Calculated) : 63.8  Vital Signs: Temp: 99 F (37.2 C) (09/05 0542) Temp Source: Oral (09/05 0542) BP: 125/76 (09/05 0542) Pulse Rate: 92 (09/05 0542) Intake/Output from previous day: 09/04 0701 - 09/05 0700 In: 1750 [I.V.:1750] Out: 1980 [Urine:1130; Drains:775; Stool:75] Intake/Output from this shift: Total I/O In: 100 [P.O.:100] Out: 520 [Urine:350; Drains:170]   Insulin Requirements in the past 24 hours:  SSI d/c'ed 11/24/15  Assessment [name] presented on 8/17/2017with fever, nausea and vomiting.  He was recently discharged after repair of a bleeding ulcer and was on TPN during that admission.  Repeat CT showed thickening of the gastrojejunostomy limb of his anastomosis as well as the duodenum.  MD reports minimal nutrition over the last couple of weeks and Pharmacy consulted to resume TPN 11/15/15.    Surgeries/Procedures: 06/2015 - perforated sigmoid diverticulitis s/p colostomy  8/25 - partial small bowel resection for perforation along incarcerated limb of hernia 8/3 - Billroth II repair, partial gastrectomy for giant ulcer  GI: Pepcid in TPN. Albumin up to 1.7. Prealbumin improved at 18.7. Severe nausea w/ PO intake, but no vomiting, gas and stool in ostomy. G-tube clamped.  Endo: no hx DM. Last AM glucose ok at 123 Lytes: no lytes today Renal: Ureteral stone should pass w/o intervention per Urology. SCr 0.72 stable, CrCl ~74m/min, NS 20K at 20 ml/hr, Flomax Hepatobil: LFTs WNL except alk phos trending up, tbili / TG WNL ID: S/p Zosyn/Fluc for bowel perf/peritonitis. Afebrile, WBC down to 14, cultures show no growth. ID team following patient as well. Best Practices: Lovenox, MC TPN Access: double lumen  PICC 11/15/15 TPN start date: 11/16/15  Current Nutrition: TPN Clear liquids  Nutritional Goals: per RD recs on 8/29 1800-2000 105-115g protein per day  Plan: Continue Clinimix E 5/15 at 875mhr Continue 20% lipid emulsion at 102mr TPN + IVFE provides 100 g of protein and 1894 kCals per day meeting >90% of protein and 100% of kCal needs Continue NS with 22m81mf KCl at 22ml7mpe rMD Pecid 40mg 48my in TPN MVI and TE in TPN Monitor TPN labs F/U advancement in diet and ability to wean TPN - [facility] notes plan to start tapering TPN down in ~ 48 hrs  MichelEudelia Bunchm.D. [phone] 10:32 AM

## 2015-12-02 NOTE — Progress Notes (Signed)
Nutrition Follow-up  DOCUMENTATION CODES:   Severe malnutrition in context of acute illness/injury  INTERVENTION:   -TPN management per pharmacy -RD will follow for diet advancement and supplement as appropriate  NUTRITION DIAGNOSIS:   Malnutrition related to acute illness as evidenced by percent weight loss, moderate depletions of muscle mass, moderate depletion of body fat.  Ongoing  GOAL:   Patient will meet greater than or equal to 90% of their needs  Met with TPN  MONITOR:   Diet advancement, PO intake, Labs, Weight trends, Skin, I & O's  REASON FOR ASSESSMENT:   Consult, Malnutrition Screening Tool New TPN/TNA  ASSESSMENT:   65 year old male. He is status post an antrectomy with B2 anastomosis on August 3 for bleeding ulcer. He has had a previous colostomy for perforated diverticulitis. He was discharged home approximate 4 days ago. He has had increasing fatigue, abdominal discomfort, nausea, and fever over the past 2 days.  s/p Procedure(s) on 11/21/15: REDUCTION OF HIATAL HERNIA , REPAIR HIATAL HERNIA, RESECTION SMALL BOWEL WITH ANASTOMOSIS, PLACEMENT GASTROSTOMY TUBE, PLACEMENT DUODENOSTOMY TUBE   Pt transferred to floor on 11/30/15.   G-tube has been clamped since 11/29/15. Pt advanced to full liquid diet on 12/01/15.   Case discussed with RN. Pt is tolerating PO diet well. Intake has been poor, due to low appetite. Meal completion 5-20%. However, noted 75% completion of breakfast tray per doc flowsheets. Per staff, pt feels well after eating today, with no nausea or vomiting.   Per pharmacy note, plan to continue Clinimix E 5/15 at 23m/hr and 20% lipid emulsion at 141mhr.TPN + IVFE providing 1894 kcals and 100 grams protein per day meeting 100% of kcal needs and 95% of protein needs. Plan to wean TPN in 48 hours.   Labs reviewed: Na: 132 (on IV supplementation).   Diet Order:  TPN (CLINIMIX-E) Adult Diet full liquid Room service appropriate? Yes; Fluid  consistency: Thin TPN (CLINIMIX-E) Adult  Skin:  Wound (see comment) (post-op full thickness abd wound)  Last BM:  12/02/15  Height:   Ht Readings from Last 1 Encounters:  11/14/15 '5\' 6"'  (1.676 m)    Weight:   Wt Readings from Last 1 Encounters:  11/25/15 156 lb 4.9 oz (70.9 kg)    Ideal Body Weight:  64.5 kg  BMI:  Body mass index is 25.23 kg/m.  Estimated Nutritional Needs:   Kcal:  1800-2000  Protein:  105-115 gm  Fluid:  1.8-2.0 L  EDUCATION NEEDS:   No education needs identified at this time  Komal Stangelo A. WiJimmye NormanRD, LDN, CDE Pager: [phone] hours Pager: [phone]

## 2015-12-02 NOTE — Progress Notes (Signed)
[facility] Progress Note  11 Days Post-Op  Subjective: Reports severe nausea with PO intake, but no vomiting. Abdominal pain is rated as a 1-2, worse after walking. Denies fever/chills. Increasing ambulation - ambulated 8 laps yesterday. Urinating without hesitancy. +gas and brown stool in ostomy pouch.  Objective: Vital signs in last 24 hours: Temp:  [98.3 F (36.8 C)-99 F (37.2 C)] 99 F (37.2 C) (09/05 0542) Pulse Rate:  [92-96] 92 (09/05 0542) Resp:  [17] 17 (09/05 0542) BP: (117-125)/(65-76) 125/76 (09/05 0542) SpO2:  [95 %-96 %] 96 % (09/05 0542) Last BM Date: 12/01/15  Intake/Output from previous day: 09/04 0701 - 09/05 0700 In: 1750 [I.V.:1750] Out: 1980 [Urine:1130; Drains:775; Stool:75] Intake/Output this shift: No intake/output data recorded.  PE: Gen:  Alert, NAD, pleasant and cooperative Pulm:  CTA, no W/R/R Abd: Soft, NT/ND, hypoactive BS, laparotomy incision C/D/I >90% granulation tissue, G tube clamped, 2 RLQ bulb drains in place - one with minimal sanguinous output, one with a small amount of white mucous, open drain with bilious output (700+/24h).  Lab Results:   Recent Labs  11/30/15 0400 12/01/15 0535  WBC 13.9* 14.0*  HGB 7.7* 8.5*  HCT 24.5* 27.4*  PLT 530* 545*   BMET  Recent Labs  12/01/15 0535  NA 132*  K 4.3  CL 101  CO2 25  GLUCOSE 123*  BUN 18  CREATININE 0.72  CALCIUM 7.8*   PT/INR No results for input(s): LABPROT, INR in the last 72 hours. CMP     Component Value Date/Time   NA 132 (L) 12/01/2015 0535   K 4.3 12/01/2015 0535   CL 101 12/01/2015 0535   CO2 25 12/01/2015 0535   GLUCOSE 123 (H) 12/01/2015 0535   BUN 18 12/01/2015 0535   CREATININE 0.72 12/01/2015 0535   CALCIUM 7.8 (L) 12/01/2015 0535   PROT 5.7 (L) 12/01/2015 0535   ALBUMIN 1.7 (L) 12/01/2015 0535   AST 19 12/01/2015 0535   ALT 32 12/01/2015 0535   ALKPHOS 352 (H) 12/01/2015 0535   BILITOT 0.6 12/01/2015 0535   GFRNONAA >60  12/01/2015 0535   GFRAA >60 12/01/2015 0535   Lipase     Component Value Date/Time   LIPASE 44 11/13/2015 2300       Studies/Results: No results found.  Anti-infectives: Anti-infectives    Start     Dose/Rate Route Frequency Ordered Stop   11/22/15 0500  vancomycin (VANCOCIN) IVPB 1000 mg/200 mL premix  Status:  Discontinued     1,000 mg 200 mL/hr over 60 Minutes Intravenous Every 8 hours 11/21/15 1950 11/24/15 1022   11/21/15 2030  vancomycin (VANCOCIN) IVPB 1000 mg/200 mL premix     1,000 mg 200 mL/hr over 60 Minutes Intravenous STAT 11/21/15 1950 11/22/15 0051   11/21/15 1130  piperacillin-tazobactam (ZOSYN) IVPB 3.375 g  Status:  Discontinued     3.375 g 12.5 mL/hr over 240 Minutes Intravenous To Surgery 11/21/15 1128 11/21/15 1403   11/19/15 0400  vancomycin (VANCOCIN) IVPB 750 mg/150 ml premix  Status:  Discontinued     750 mg 150 mL/hr over 60 Minutes Intravenous Every 12 hours 11/18/15 1459 11/21/15 1950   11/18/15 1600  fluconazole (DIFLUCAN) IVPB 400 mg     400 mg 100 mL/hr over 120 Minutes Intravenous Every 24 hours 11/18/15 1452 11/28/15 1900   11/18/15 1530  vancomycin (VANCOCIN) 1,250 mg in sodium chloride 0.9 % 250 mL IVPB     1,250 mg 166.7 mL/hr over 90 Minutes Intravenous  Once 11/18/15 1452 11/18/15 1940   11/14/15 0600  piperacillin-tazobactam (ZOSYN) IVPB 3.375 g     3.375 g 12.5 mL/hr over 240 Minutes Intravenous Every 8 hours 11/13/15 2328 11/28/15 2359   11/13/15 2300  piperacillin-tazobactam (ZOSYN) IVPB 3.375 g     3.375 g 100 mL/hr over 30 Minutes Intravenous  Once 11/13/15 2257 11/14/15 0217     Assessment/Plan POD# 11 s/p Procedure(s): REDUCTION OF HIATAL HERNIA , REPAIR HIATAL HERNIA, RESECTION SMALL BOWEL WITH ANASTOMOSIS, PLACEMENT GASTROSTOMY TUBE, PLACEMENT DUODENOSTOMY TUBE  Dr. Kieth Brightly, 11/21/15  FEN: G-tube clamped, TPN, cont. full liquid diet - plan to start tapering down TPN in ~ 48h ID: Zosyn 8/18 - 9/1 DVT Proph: lovenox,  SCD's  Plan: slowly advance diet and wean TPN, ambulate Start oral pain medications  Cont. wound care   LOS: 18 days   Jill Alexanders , [facility] 12/02/2015, 8:29 AM Pager: [phone] Consults: [phone] Mon-Fri 7:00 am-4:30 pm Sat-Sun 7:00 am-11:30 am

## 2015-12-03 DIAGNOSIS — R188 Other ascites: Secondary | ICD-10-CM | POA: Diagnosis not present

## 2015-12-03 DIAGNOSIS — R6520 Severe sepsis without septic shock: Secondary | ICD-10-CM | POA: Diagnosis not present

## 2015-12-03 DIAGNOSIS — J9601 Acute respiratory failure with hypoxia: Secondary | ICD-10-CM | POA: Diagnosis not present

## 2015-12-03 DIAGNOSIS — E871 Hypo-osmolality and hyponatremia: Secondary | ICD-10-CM | POA: Diagnosis not present

## 2015-12-03 DIAGNOSIS — K440 Diaphragmatic hernia with obstruction, without gangrene: Secondary | ICD-10-CM | POA: Diagnosis not present

## 2015-12-03 DIAGNOSIS — N179 Acute kidney failure, unspecified: Secondary | ICD-10-CM | POA: Diagnosis not present

## 2015-12-03 DIAGNOSIS — J90 Pleural effusion, not elsewhere classified: Secondary | ICD-10-CM | POA: Diagnosis not present

## 2015-12-03 DIAGNOSIS — A419 Sepsis, unspecified organism: Secondary | ICD-10-CM | POA: Diagnosis not present

## 2015-12-03 DIAGNOSIS — E43 Unspecified severe protein-calorie malnutrition: Secondary | ICD-10-CM | POA: Diagnosis not present

## 2015-12-03 LAB — CBC
HCT: 26.5 % — ABNORMAL LOW (ref 39.0–52.0)
Hemoglobin: 8.3 g/dL — ABNORMAL LOW (ref 13.0–17.0)
MCH: 27.4 pg (ref 26.0–34.0)
MCHC: 31.3 g/dL (ref 30.0–36.0)
MCV: 87.5 fL (ref 78.0–100.0)
PLATELETS: 566 10*3/uL — AB (ref 150–400)
RBC: 3.03 MIL/uL — AB (ref 4.22–5.81)
RDW: 16.9 % — AB (ref 11.5–15.5)
WBC: 13.5 10*3/uL — AB (ref 4.0–10.5)

## 2015-12-03 MED ORDER — FAMOTIDINE 20 MG PO TABS
20.0000 mg | ORAL_TABLET | Freq: Two times a day (BID) | ORAL | Status: DC
  Administered 2015-12-03 – 2015-12-05 (×4): 20 mg via ORAL
  Filled 2015-12-03 (×5): qty 1

## 2015-12-03 MED ORDER — OXYCODONE HCL 5 MG PO TABS: 5.0000 mg | ORAL_TABLET | ORAL | Status: DC | PRN

## 2015-12-03 MED ORDER — FENTANYL CITRATE (PF) 100 MCG/2ML IJ SOLN
25.0000 ug | INTRAMUSCULAR | Status: DC | PRN
  Administered 2015-12-03 – 2015-12-05 (×10): 50 ug via INTRAVENOUS
  Filled 2015-12-03 (×10): qty 2

## 2015-12-03 MED ORDER — HYDROCODONE-ACETAMINOPHEN 5-325 MG PO TABS
1.0000 | ORAL_TABLET | ORAL | Status: DC | PRN
  Administered 2015-12-03 – 2015-12-04 (×3): 2 via ORAL
  Filled 2015-12-03 (×5): qty 2

## 2015-12-03 MED ORDER — METHOCARBAMOL 750 MG PO TABS
750.0000 mg | ORAL_TABLET | Freq: Four times a day (QID) | ORAL | Status: DC | PRN
  Administered 2015-12-03 – 2015-12-05 (×2): 750 mg via ORAL
  Filled 2015-12-03 (×2): qty 1

## 2015-12-03 MED ORDER — ACETAMINOPHEN 325 MG PO TABS: 650.0000 mg | ORAL_TABLET | Freq: Four times a day (QID) | ORAL | Status: DC | PRN

## 2015-12-03 NOTE — Progress Notes (Signed)
[facility] Progress Note  12 Days Post-Op  Subjective: NAE overnight. Denies abdominal pain and SOB. Tolerating full liquid diet, does have early satiety and persistent nausea. No vomiting. +gas and stool in ostomy. Ambulating. Discussed transition to PO pain medication with the patient - he requests Hydrocodone rather than Oxy.   Right open drain - 485+cc/24h, bilious JP#1 - < 5cc JP#2 - 30 cc/24h, serosanguinous  Objective: Vital signs in last 24 hours: Temp:  [98.1 F (36.7 C)-100.2 F (37.9 C)] 99.5 F (37.5 C) (09/06 0524) Pulse Rate:  [98-104] 98 (09/06 0524) Resp:  [17-18] 18 (09/06 0524) BP: (117-135)/(72-76) 121/75 (09/06 0524) SpO2:  [95 %-100 %] 95 % (09/06 0524) Last BM Date: 12/02/15  Intake/Output from previous day: 09/05 0701 - 09/06 0700 In: 6155.7 [P.O.:400; I.V.:5755.7] Out: 1942 [Urine:1300; Drains:517; Stool:125] Intake/Output this shift: No intake/output data recorded.  PE: Gen:  Alert, NAD, pleasant and cooperative Pulm:  CTA, no W/R/R Abd: Soft, NT/ND, hypoactive BS, laparotomy incision C/D/I >90% granulation tissue, G tube clamped, 2 RLQ bulb drains in place - one with minimal sanguinous output, one with a small amount of white mucous, open drain with bilious output  Lab Results:   Recent Labs  12/01/15 0535 12/03/15 0500  WBC 14.0* 13.5*  HGB 8.5* 8.3*  HCT 27.4* 26.5*  PLT 545* 566*   BMET  Recent Labs  12/01/15 0535  NA 132*  K 4.3  CL 101  CO2 25  GLUCOSE 123*  BUN 18  CREATININE 0.72  CALCIUM 7.8*   PT/INR No results for input(s): LABPROT, INR in the last 72 hours. CMP     Component Value Date/Time   NA 132 (L) 12/01/2015 0535   K 4.3 12/01/2015 0535   CL 101 12/01/2015 0535   CO2 25 12/01/2015 0535   GLUCOSE 123 (H) 12/01/2015 0535   BUN 18 12/01/2015 0535   CREATININE 0.72 12/01/2015 0535   CALCIUM 7.8 (L) 12/01/2015 0535   PROT 5.7 (L) 12/01/2015 0535   ALBUMIN 1.7 (L) 12/01/2015 0535   AST 19  12/01/2015 0535   ALT 32 12/01/2015 0535   ALKPHOS 352 (H) 12/01/2015 0535   BILITOT 0.6 12/01/2015 0535   GFRNONAA >60 12/01/2015 0535   GFRAA >60 12/01/2015 0535   Lipase     Component Value Date/Time   LIPASE 44 11/13/2015 2300   Studies/Results: No results found.  Anti-infectives: Anti-infectives    Start     Dose/Rate Route Frequency Ordered Stop   11/22/15 0500  vancomycin (VANCOCIN) IVPB 1000 mg/200 mL premix  Status:  Discontinued     1,000 mg 200 mL/hr over 60 Minutes Intravenous Every 8 hours 11/21/15 1950 11/24/15 1022   11/21/15 2030  vancomycin (VANCOCIN) IVPB 1000 mg/200 mL premix     1,000 mg 200 mL/hr over 60 Minutes Intravenous STAT 11/21/15 1950 11/22/15 0051   11/21/15 1130  piperacillin-tazobactam (ZOSYN) IVPB 3.375 g  Status:  Discontinued     3.375 g 12.5 mL/hr over 240 Minutes Intravenous To Surgery 11/21/15 1128 11/21/15 1403   11/19/15 0400  vancomycin (VANCOCIN) IVPB 750 mg/150 ml premix  Status:  Discontinued     750 mg 150 mL/hr over 60 Minutes Intravenous Every 12 hours 11/18/15 1459 11/21/15 1950   11/18/15 1600  fluconazole (DIFLUCAN) IVPB 400 mg     400 mg 100 mL/hr over 120 Minutes Intravenous Every 24 hours 11/18/15 1452 11/28/15 1900   11/18/15 1530  vancomycin (VANCOCIN) 1,250 mg in sodium chloride 0.9 %  250 mL IVPB     1,250 mg 166.7 mL/hr over 90 Minutes Intravenous  Once 11/18/15 1452 11/18/15 1940   11/14/15 0600  piperacillin-tazobactam (ZOSYN) IVPB 3.375 g     3.375 g 12.5 mL/hr over 240 Minutes Intravenous Every 8 hours 11/13/15 2328 11/28/15 2359   11/13/15 2300  piperacillin-tazobactam (ZOSYN) IVPB 3.375 g     3.375 g 100 mL/hr over 30 Minutes Intravenous  Once 11/13/15 2257 11/14/15 0217     Assessment/Plan REDUCTION OF HIATAL HERNIA , REPAIR HIATAL HERNIA, RESECTION SMALL BOWEL WITH ANASTOMOSIS, PLACEMENT GASTROSTOMY TUBE, PLACEMENT DUODENOSTOMY TUBE  Dr. Kieth Brightly, 11/21/15  FEN: G-tube clamped, 1/2 rate TPN today and  will d/c tonight, soft diet effective lunch ID: Zosyn 8/18 - 9/1 DVT Proph: lovenox, SCD's  Plan: Soft diet and wean TPN, ambulate Encourage oral pain medications  - decreased fentanyl (breakthrough only!), start PRN Hydrocodone/acetaminophen, Robaxin Cont. wound care    LOS: 19 days    Jill Alexanders , [facility] 12/03/2015, 7:31 AM Pager: [phone] Consults: [phone] Mon-Fri 7:00 am-4:30 pm Sat-Sun 7:00 am-11:30 am

## 2015-12-03 NOTE — Progress Notes (Signed)
PT Cancellation Note  Patient Details Name: Miguel [name] MRN: [id number] DOB: Jul 29, 1950   Cancelled Treatment:    Reason Eval/Treat Not Completed: Patient declined, no reason specified. Pt declining therapy session at this time secondary to increased nausea this morning. PT will continue to f/u with pt as appropriate.   Clearnce Sorrel Mehkai Gallo 12/03/2015, 9:31 AM Sherie Don, PT, DPT [phone]

## 2015-12-03 NOTE — Progress Notes (Signed)
Physical Therapy Treatment Patient Details Name: ROHIL MINZEY MRN: [id number] DOB: 05/14/1950 Today's Date: 12/03/2015    History of Present Illness Patient is a 65 yo male admitted 11/13/15 with fever, confusion following partial gastrectomy on 10/30/15.  Patient s/p repair of bowel obstruction, found perforation requiring additional resection.  Post-op acute resp failure and intubated 11/20/15.  Extubated 11/22/15.    PMH:  partial gastrectomy 10/30/15, anemia, anxiety, HTN    PT Comments    Pt presented supine in bed with HOB elevated, awake and willing to participate in therapy session. Pt continuing to progress towards achieving his functional goals, and ambulated without an assistive device during this session. Pt moving slowly and cautiously during gait; however, no LOB. Pt would continue to benefit from skilled physical therapy services at this time while admitted and after d/c to address his limitations in order to improve his overall safety and independence with functional mobility.   Follow Up Recommendations  Home health PT;Supervision - Intermittent     Equipment Recommendations  Rolling walker with 5" wheels    Recommendations for Other Services       Precautions / Restrictions Precautions Precautions: Fall Restrictions Weight Bearing Restrictions: No    Mobility  Bed Mobility Overal bed mobility: Needs Assistance Bed Mobility: Supine to Sit     Supine to sit: Supervision;HOB elevated     General bed mobility comments: pt required increased time and use of bed rails  Transfers Overall transfer level: Needs assistance Equipment used: None Transfers: Sit to/from Stand Sit to Stand: Supervision         General transfer comment: pt required increased time   Ambulation/Gait Ambulation/Gait assistance: Min guard Ambulation Distance (Feet): 300 Feet Assistive device: None Gait Pattern/deviations: Step-through pattern;Decreased stride length Gait velocity:  decreased Gait velocity interpretation: Below normal speed for age/gender General Gait Details: pt with slow and cautious movement; no LOB. PT providing occasional VC'ing for forward gaze   Stairs            Wheelchair Mobility    Modified Rankin (Stroke Patients Only)       Balance Overall balance assessment: Needs assistance Sitting-balance support: Feet supported;No upper extremity supported Sitting balance-Leahy Scale: Fair     Standing balance support: During functional activity;No upper extremity supported Standing balance-Leahy Scale: Fair                      Cognition Arousal/Alertness: Awake/alert Behavior During Therapy: WFL for tasks assessed/performed Overall Cognitive Status: Within Functional Limits for tasks assessed                      Exercises General Exercises - Lower Extremity Ankle Circles/Pumps: AROM;Both;10 reps;Supine Quad Sets: AROM;Strengthening;Both;10 reps;Supine Straight Leg Raises: AROM;Strengthening;Both;10 reps;Supine    General Comments        Pertinent Vitals/Pain Pain Assessment: No/denies pain Pain Intervention(s): Monitored during session    Home Living                      Prior Function            PT Goals (current goals can now be found in the care plan section) Acute Rehab PT Goals Patient Stated Goal: to return home and increase appetite PT Goal Formulation: With patient Time For Goal Achievement: 12/06/15 Potential to Achieve Goals: Good Progress towards PT goals: Progressing toward goals    Frequency  Min 3X/week    PT Plan Current  plan remains appropriate    Co-evaluation             End of Session Equipment Utilized During Treatment: Gait belt (placed higher up on trunk to avoid drains/bags) Activity Tolerance: Patient tolerated treatment well Patient left: in chair;with call bell/phone within reach;with family/visitor present     Time: [id number] PT Time  Calculation (min) (ACUTE ONLY): 32 min  Charges:  $Gait Training: 8-22 mins $Therapeutic Exercise: 8-22 mins                    G CodesClearnce Sorrel Ruthy Forry December 19, 2015, 12:22 PM Sherie Don, [place], DPT [phone]

## 2015-12-04 MED ORDER — ENSURE ENLIVE PO LIQD
237.0000 mL | Freq: Two times a day (BID) | ORAL | Status: DC
  Administered 2015-12-04 – 2015-12-05 (×2): 237 mL via ORAL

## 2015-12-04 NOTE — Consult Note (Addendum)
   [facility] CM Inpatient Consult   12/04/2015  Miguel [name] Nov 30, 1950 [id number]   Came to visit Mr. Miguel [name] on behalf of Norm Parcel to [facility] program. He reports he may discharge home tomorrow. He was sitting on side of bed. Reports that he just finished walking around. Made him aware that [facility]/Telephonic [facility] RNCM will contact him post hospital discharge. He is agreeable to this. Noted he will have home health arranged. Will make inpatient RNCM aware that [name] will receive post hospital follow up calls on behalf of Link to [facility] Care Management program for [facility] employees/dependents with [facility].   [name], MSN-Ed, RN,BSN [facility] Liaison [phone]

## 2015-12-04 NOTE — Progress Notes (Signed)
Nutrition Follow-up  DOCUMENTATION CODES:   Severe malnutrition in context of acute illness/injury  INTERVENTION:   -Ensure Enlive po BID, each supplement provides 350 kcal and 20 grams of protein  NUTRITION DIAGNOSIS:   Malnutrition related to acute illness as evidenced by percent weight loss, moderate depletions of muscle mass, moderate depletion of body fat.  Ongoing  GOAL:   Patient will meet greater than or equal to 90% of their needs  Progressing  MONITOR:   Diet advancement, PO intake, Labs, Weight trends, Skin, I & O's  REASON FOR ASSESSMENT:   Consult, Malnutrition Screening Tool New TPN/TNA  ASSESSMENT:   65 year old male. He is status post an antrectomy with B2 anastomosis on August 3 for bleeding ulcer. He has had a previous colostomy for perforated diverticulitis. He was discharged home approximate 4 days ago. He has had increasing fatigue, abdominal discomfort, nausea, and fever over the past 2 days.  s/p Procedure(s) on 11/21/15: REDUCTION OF HIATAL HERNIA , REPAIR HIATAL HERNIA, RESECTION SMALL BOWEL WITH ANASTOMOSIS, PLACEMENT GASTROSTOMY TUBE, PLACEMENT DUODENOSTOMY TUBE   Pt sleeping soundly at time of visit. RD did not wake. No family present.   TPN d/c on 12/03/15. Pt advanced to a soft diet. Meal completion 5-75% per doc flowsheets.   Case discussed with RN. Pt remains with poor appetite, however, pt making a lot of effort to consume meals. Observed breakfast tray at bedside, which was untouched. RD will add nutritional supplements to optimize intake. RN confirmed plan to d/c 12/05/15.   Labs reviewed: Na: 132 (on IV supplementation).   Diet Order:  DIET SOFT Room service appropriate? Yes; Fluid consistency: Thin  Skin:  Wound (see comment) (post-op full thickness abd wound)  Last BM:  12/04/15  Height:   Ht Readings from Last 1 Encounters:  11/14/15 5\' 6"  (1.676 m)    Weight:   Wt Readings from Last 1 Encounters:  11/25/15 156 lb 4.9 oz  (70.9 kg)    Ideal Body Weight:  64.5 kg  BMI:  Body mass index is 25.23 kg/m.  Estimated Nutritional Needs:   Kcal:  1800-2000  Protein:  105-115 gm  Fluid:  1.8-2.0 L  EDUCATION NEEDS:   No education needs identified at this time  Zeshan Sena A. Jimmye Norman, RD, LDN, CDE Pager: [phone] After hours Pager: [phone]

## 2015-12-04 NOTE — Progress Notes (Signed)
[facility] Progress Note  13 Days Post-Op  Subjective: NAE. Abd pain 4/10, worse after walking. Tolerating soft diet. Reports some sweating yesterday after taking 2 tabs of hydrocodone. Urinating and having BMs in ostomy.  Objective: Vital signs in last 24 hours: Temp:  [97.9 F (36.6 C)-98.6 F (37 C)] 98.4 F (36.9 C) (09/07 0424) Pulse Rate:  [83-99] 94 (09/07 0424) Resp:  [18] 18 (09/07 0424) BP: (114-123)/(63-71) 115/71 (09/07 0424) SpO2:  [95 %-98 %] 95 % (09/07 0424) Last BM Date: 12/03/15  Intake/Output from previous day: 09/06 0701 - 09/07 0700 In: 1277.9 [P.O.:340; I.V.:937.9] Out: 1565 [Urine:1125; Drains:430; Stool:10] Intake/Output this shift: No intake/output data recorded.  PE: Gen: Alert, NAD, pleasant and cooperative Pulm: CTA, no W/R/R Abd: Soft, NT/ND, +BS, laparotomy incision C/D/I >90% granulation tissue, stoma pink and viable -some herniation. Liquid stool in ostomy pouch.  DRAINS/TUBES: Open drain/duodenotomy - 430 cc/24h, bilious Both RLQ 81F blake drains - <5 cc/24h (one along duodenal stump, one along left liver to left stomach) 20 F Gtube - clamped   Lab Results:   Recent Labs  12/03/15 0500  WBC 13.5*  HGB 8.3*  HCT 26.5*  PLT 566*   BMET No results for input(s): NA, K, CL, CO2, GLUCOSE, BUN, CREATININE, CALCIUM in the last 72 hours. PT/INR No results for input(s): LABPROT, INR in the last 72 hours. CMP     Component Value Date/Time   NA 132 (L) 12/01/2015 0535   K 4.3 12/01/2015 0535   CL 101 12/01/2015 0535   CO2 25 12/01/2015 0535   GLUCOSE 123 (H) 12/01/2015 0535   BUN 18 12/01/2015 0535   CREATININE 0.72 12/01/2015 0535   CALCIUM 7.8 (L) 12/01/2015 0535   PROT 5.7 (L) 12/01/2015 0535   ALBUMIN 1.7 (L) 12/01/2015 0535   AST 19 12/01/2015 0535   ALT 32 12/01/2015 0535   ALKPHOS 352 (H) 12/01/2015 0535   BILITOT 0.6 12/01/2015 0535   GFRNONAA >60 12/01/2015 0535   GFRAA >60 12/01/2015 0535   Lipase    Component Value Date/Time   LIPASE 44 11/13/2015 2300   Studies/Results: No results found.  Anti-infectives: Anti-infectives    Start     Dose/Rate Route Frequency Ordered Stop   11/22/15 0500  vancomycin (VANCOCIN) IVPB 1000 mg/200 mL premix  Status:  Discontinued     1,000 mg 200 mL/hr over 60 Minutes Intravenous Every 8 hours 11/21/15 1950 11/24/15 1022   11/21/15 2030  vancomycin (VANCOCIN) IVPB 1000 mg/200 mL premix     1,000 mg 200 mL/hr over 60 Minutes Intravenous STAT 11/21/15 1950 11/22/15 0051   11/21/15 1130  piperacillin-tazobactam (ZOSYN) IVPB 3.375 g  Status:  Discontinued     3.375 g 12.5 mL/hr over 240 Minutes Intravenous To Surgery 11/21/15 1128 11/21/15 1403   11/19/15 0400  vancomycin (VANCOCIN) IVPB 750 mg/150 ml premix  Status:  Discontinued     750 mg 150 mL/hr over 60 Minutes Intravenous Every 12 hours 11/18/15 1459 11/21/15 1950   11/18/15 1600  fluconazole (DIFLUCAN) IVPB 400 mg     400 mg 100 mL/hr over 120 Minutes Intravenous Every 24 hours 11/18/15 1452 11/28/15 1900   11/18/15 1530  vancomycin (VANCOCIN) 1,250 mg in sodium chloride 0.9 % 250 mL IVPB     1,250 mg 166.7 mL/hr over 90 Minutes Intravenous  Once 11/18/15 1452 11/18/15 1940   11/14/15 0600  piperacillin-tazobactam (ZOSYN) IVPB 3.375 g     3.375 g 12.5 mL/hr over 240 Minutes  Intravenous Every 8 hours 11/13/15 2328 11/28/15 2359   11/13/15 2300  piperacillin-tazobactam (ZOSYN) IVPB 3.375 g     3.375 g 100 mL/hr over 30 Minutes Intravenous  Once 11/13/15 2257 11/14/15 0217     Assessment/Plan REDUCTION OF HIATAL HERNIA , REPAIR HIATAL HERNIA, RESECTION SMALL BOWEL WITH ANASTOMOSIS, PLACEMENT GASTROSTOMY TUBE, PLACEMENT DUODENOSTOMY TUBE  Dr. Kieth Brightly, 11/21/15 -TPN d/ced 11/03/15  FEN: G-tube clamped, soft diet ID: Zosyn 8/18 - 9/1 DVT Proph: lovenox, SCD's  Plan: cont soft diet and ambulate; plan for discharge tomorrow - consulted case mgmt for drain/wound care. Encourage oral pain  medications  -  fentanyl for breakthrough only!, cont. Hydrocodone/acetaminophen, Robaxin  Cont. wound care    LOS: 20 days    Jill Alexanders , [facility] 12/04/2015, 7:53 AM Pager: [phone] Consults: [phone] Mon-Fri 7:00 am-4:30 pm Sat-Sun 7:00 am-11:30 am

## 2015-12-04 NOTE — Care Management Note (Signed)
Case Management Note  Patient Details  Name: KYHEEM BOULOS MRN: [id number] Date of Birth: 20-Aug-1950  Subjective/Objective:                    Action/Plan:  Discussed expected discharge date of tomorrow with patient . Patient agrees with [facility] and PT gave consent for NCM to call wife Dianne [id number] left voice mail with my contact information.  PT recommending walker for home . Patient states he has walked without walker and feels he does not need one at home . Will discuss same with wife when she calls back.   Awaiting clarification on drain and wound care from PA and orders and face to face .   Abby with [facility] )  aware of referral Expected Discharge Date:                  Expected Discharge Plan:  [place]  In-House Referral:     Discharge planning Services  CM Consult  Post Acute Care Choice:    Choice offered to:  Patient  DME Arranged:    DME Agency:     HH Arranged:  RN, PT HH Agency:  [facility]  Status of Service:  In process, will continue to follow  If discussed at Long Length of Stay Meetings, dates discussed:    Additional Comments:  Marilu Favre, RN 12/04/2015, 10:59 AM

## 2015-12-05 LAB — CBC
HEMATOCRIT: 28.2 % — AB (ref 39.0–52.0)
HEMOGLOBIN: 8.7 g/dL — AB (ref 13.0–17.0)
MCH: 27.2 pg (ref 26.0–34.0)
MCHC: 30.9 g/dL (ref 30.0–36.0)
MCV: 88.1 fL (ref 78.0–100.0)
Platelets: 586 10*3/uL — ABNORMAL HIGH (ref 150–400)
RBC: 3.2 MIL/uL — ABNORMAL LOW (ref 4.22–5.81)
RDW: 16.7 % — ABNORMAL HIGH (ref 11.5–15.5)
WBC: 7.5 10*3/uL (ref 4.0–10.5)

## 2015-12-05 MED ORDER — HYDROCODONE-ACETAMINOPHEN 5-325 MG PO TABS: 1.0000 | ORAL_TABLET | ORAL | 0 refills | Status: DC | PRN

## 2015-12-05 MED ORDER — ACETAMINOPHEN 500 MG PO TABS: 500.0000 mg | ORAL_TABLET | Freq: Three times a day (TID) | ORAL | 0 refills | Status: AC | PRN

## 2015-12-05 MED ORDER — ENSURE ENLIVE PO LIQD: 237.0000 mL | Freq: Two times a day (BID) | ORAL | 12 refills | Status: DC

## 2015-12-05 MED ORDER — METHOCARBAMOL 750 MG PO TABS: 750.0000 mg | ORAL_TABLET | Freq: Three times a day (TID) | ORAL | 0 refills | Status: DC | PRN

## 2015-12-05 MED FILL — HYDROCODON-APAP 5-325: 5-325 | 3 days supply | Qty: 30 | Fill #0

## 2015-12-05 MED FILL — METHOCARBAMOL 750 MG TABLET: 750 | 5 days supply | Qty: 15 | Fill #0

## 2015-12-05 NOTE — Progress Notes (Signed)
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  Patient s/p repair of bowel obstruction, found perforation requiring additional resection.  Post-op acute resp failure and intubated 11/20/15.  Extubated 11/22/15.    PMH:  partial gastrectomy 10/30/15, anemia, anxiety, HTN    PT Comments    Pt presented supine in bed with HOB elevated, awake and willing to participate in therapy session. Pt reported that he is d/c'ing home today. Pt continues to make steady progress towards his functional goals. Pt would continue to benefit from skilled physical therapy services at this time while admitted and after d/c to address his limitations in order to improve his overall safety and independence with functional mobility.   Follow Up Recommendations  Home health PT;Supervision - Intermittent     Equipment Recommendations  None recommended by PT;Other (comment) (PT suggested a SPC, pt refusing at this time)    Recommendations for Other Services       Precautions / Restrictions Precautions Precautions: Fall Restrictions Weight Bearing Restrictions: No    Mobility  Bed Mobility Overal bed mobility: Needs Assistance Bed Mobility: Supine to Sit     Supine to sit: Supervision;HOB elevated     General bed mobility comments: pt required increased time and use of bed rails  Transfers Overall transfer level: Needs assistance Equipment used: None Transfers: Sit to/from Stand Sit to Stand: Supervision         General transfer comment: pt required increased time   Ambulation/Gait Ambulation/Gait assistance: Supervision Ambulation Distance (Feet): 400 Feet Assistive device: None (pt requested to push his IV pole with bilateral UEs) Gait Pattern/deviations: Step-through pattern Gait  velocity: decreased Gait velocity interpretation: Below normal speed for age/gender General Gait Details: pt with slow and cautious movement; no LOB.   Stairs            Wheelchair Mobility    Modified Rankin (Stroke Patients Only)       Balance Overall balance assessment: Needs assistance Sitting-balance support: Feet supported;No upper extremity supported Sitting balance-Leahy Scale: Fair     Standing balance support: During functional activity;No upper extremity supported Standing balance-Leahy Scale: Fair                      Cognition Arousal/Alertness: Awake/alert Behavior During Therapy: WFL for tasks assessed/performed Overall Cognitive Status: Within Functional Limits for tasks assessed                      Exercises      General Comments        Pertinent Vitals/Pain Pain Assessment: No/denies pain Pain Intervention(s): Monitored during session    Home Living                      Prior Function            PT Goals (current goals can now be found in the care plan section) Acute Rehab PT Goals Patient Stated Goal: to return home and increase appetite PT Goal Formulation: With patient Time For Goal Achievement: 12/06/15 Potential to Achieve Goals: Good Progress towards PT goals: Progressing toward goals    Frequency  Min 3X/week    PT Plan Current plan remains appropriate    Co-evaluation  End of Session   Activity Tolerance: Patient tolerated treatment well Patient left: in chair;with call bell/phone within reach;with family/visitor present     Time: 0950-1007 PT Time Calculation (min) (ACUTE ONLY): 17 min  Charges:  $Gait Training: 8-22 mins                    G CodesClearnce Sorrel Jamarcus Laduke 12/13/2015, 10:45 AM Sherie Don, PT, DPT [phone]

## 2015-12-05 NOTE — Progress Notes (Signed)
[facility] Progress Note  14 Days Post-Op  Subjective: NAE overnight. Pain controlled, tolerating diet, ambulating, urinating without hesitancy, gas and stool in ostomy pouch.  Objective: Vital signs in last 24 hours: Temp:  [98.2 F (36.8 C)-98.8 F (37.1 C)] 98.2 F (36.8 C) (09/08 0610) Pulse Rate:  [90-97] 93 (09/08 0610) Resp:  [18] 18 (09/08 0610) BP: (116-122)/(73-78) 122/78 (09/08 0610) SpO2:  [96 %-97 %] 97 % (09/08 0610) Last BM Date: 12/04/15  Intake/Output from previous day: 09/07 0701 - 09/08 0700 In: 850 [P.O.:360; I.V.:490] Out: 1650 [Urine:850; Drains:650; Stool:150] Intake/Output this shift: No intake/output data recorded.  PE: Gen: Alert, NAD, pleasant and cooperative Pulm: CTA, no W/R/R Abd: Soft, NT/ND, +BS, laparotomy incision C/D/I >90% granulation tissue, stoma pink and viable -some mucosal herniation. Liquid stool in ostomy pouch.  DRAINS/TUBES: Open drain/duodenotomy - 500 cc/24h, bilious Both RLQ 30F blake drains - <5 cc/24h (one along duodenal stump, one along left liver to left stomach); I removed the drain positioned more superiorly today, per Dr. Amie [place] recommendation. A clean Vaseline gauze/gauze dressing was placed 20 F Gtube - clamped   Lab Results:   Recent Labs  12/03/15 0500 12/05/15 0358  WBC 13.5* 7.5  HGB 8.3* 8.7*  HCT 26.5* 28.2*  PLT 566* 586*   BMET No results for input(s): NA, K, CL, CO2, GLUCOSE, BUN, CREATININE, CALCIUM in the last 72 hours. PT/INR No results for input(s): LABPROT, INR in the last 72 hours. CMP     Component Value Date/Time   NA 132 (L) 12/01/2015 0535   K 4.3 12/01/2015 0535   CL 101 12/01/2015 0535   CO2 25 12/01/2015 0535   GLUCOSE 123 (H) 12/01/2015 0535   BUN 18 12/01/2015 0535   CREATININE 0.72 12/01/2015 0535   CALCIUM 7.8 (L) 12/01/2015 0535   PROT 5.7 (L) 12/01/2015 0535   ALBUMIN 1.7 (L) 12/01/2015 0535   AST 19 12/01/2015 0535   ALT 32 12/01/2015 0535    ALKPHOS 352 (H) 12/01/2015 0535   BILITOT 0.6 12/01/2015 0535   GFRNONAA >60 12/01/2015 0535   GFRAA >60 12/01/2015 0535   Lipase     Component Value Date/Time   LIPASE 44 11/13/2015 2300   Studies/Results: No results found.  Anti-infectives: Anti-infectives    Start     Dose/Rate Route Frequency Ordered Stop   11/22/15 0500  vancomycin (VANCOCIN) IVPB 1000 mg/200 mL premix  Status:  Discontinued     1,000 mg 200 mL/hr over 60 Minutes Intravenous Every 8 hours 11/21/15 1950 11/24/15 1022   11/21/15 2030  vancomycin (VANCOCIN) IVPB 1000 mg/200 mL premix     1,000 mg 200 mL/hr over 60 Minutes Intravenous STAT 11/21/15 1950 11/22/15 0051   11/21/15 1130  piperacillin-tazobactam (ZOSYN) IVPB 3.375 g  Status:  Discontinued     3.375 g 12.5 mL/hr over 240 Minutes Intravenous To Surgery 11/21/15 1128 11/21/15 1403   11/19/15 0400  vancomycin (VANCOCIN) IVPB 750 mg/150 ml premix  Status:  Discontinued     750 mg 150 mL/hr over 60 Minutes Intravenous Every 12 hours 11/18/15 1459 11/21/15 1950   11/18/15 1600  fluconazole (DIFLUCAN) IVPB 400 mg     400 mg 100 mL/hr over 120 Minutes Intravenous Every 24 hours 11/18/15 1452 11/28/15 1900   11/18/15 1530  vancomycin (VANCOCIN) 1,250 mg in sodium chloride 0.9 % 250 mL IVPB     1,250 mg 166.7 mL/hr over 90 Minutes Intravenous  Once 11/18/15 1452 11/18/15 1940   11/14/15  0600  piperacillin-tazobactam (ZOSYN) IVPB 3.375 g     3.375 g 12.5 mL/hr over 240 Minutes Intravenous Every 8 hours 11/13/15 2328 11/28/15 2359   11/13/15 2300  piperacillin-tazobactam (ZOSYN) IVPB 3.375 g     3.375 g 100 mL/hr over 30 Minutes Intravenous  Once 11/13/15 2257 11/14/15 0217     Assessment/Plan PLACEMENT GASTROSTOMY TUBE, PLACEMENT DUODENOSTOMY TUBE  Dr. Kieth Brightly, 11/21/15 -TPN d/ced 11/03/15  FEN: G-tube clamped, soft diet ID: Zosyn 8/18 - 9/1 DVT Proph: lovenox, SCD's  Plan: discharge today with oral pain meds and home health for drain/wound  care.   LOS: 21 days    Jill Alexanders , [facility] 12/05/2015, 8:23 AM Pager: [phone] Consults: [phone] Mon-Fri 7:00 am-4:30 pm Sat-Sun 7:00 am-11:30 am

## 2015-12-05 NOTE — Discharge Summary (Signed)
[facility] Discharge Summary   Patient ID: Miguel [name] MRN: [id number] DOB/AGE: 05-22-1950 65 y.o.  Admit date: 11/13/2015 Discharge date: 12/05/2015  Admitting Diagnosis: Postoperative fever  Discharge Diagnosis Patient Active Problem List   Diagnosis Date Noted  . Postoperative fever 11/19/2015  . 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 Severe protein-calorie malnutrition ([place]) 11/17/2015  . Sepsis ([place]) 11/14/2015  . Hiccups 11/14/2015  . AKI (acute kidney injury) ([place]) 11/14/2015  . Fever   . Leg swelling   . Left shoulder pain   . Muscle spasm of left shoulder   . GI bleed 10/27/2015  . Acute blood loss anemia 10/27/2015  . Syncope 10/27/2015  . Hyperglycemia 10/27/2015  . Hypotension 10/27/2015  . Neck pain 10/27/2015  . Hematemesis 10/27/2015  . Hematochezia 10/27/2015  . Diverticulitis of colon with perforation 07/05/2015   Consultants 11/15/15 - Dr. Kathie Rhodes, Urology 11/19/15 - Dr. Michel Bickers, ID 11/21/15 - Dr. Tera Partridge, Critical Care 11/24/15 - Gwyndolyn Saxon, RN, wound ostomy care ([place]) Case Management - Jorge Mandril   Imaging: 11/14/15 - CT Chest/Abd/Pelvis Wo Contrast - Large hiatal hernia containing the entirety of the partially resected stomach and the gastrojejunostomy limb of small bowel. Interval marked nonspecific thickening of the walls of the gastrojejunostomy limb with thread-like passage of contrast in the downstream bowel which may be due to infection, inflammation, or ischemia. Interval dilatation of the native duodenum to the level of anastomosis probably due to the increased inflammatory changes of the jejunum and associated mass effect. New nonobstructing 3 mm stone within the left distal ureter. Stable nonobstructing stone near the right ureterovesicular junction probably within a ureteral diverticulum. Nonobstructing stones in the kidneys bilaterally.  11/20/15 - DG UGI W/Water Sol CM - Large hiatal hernia containing remnant  stomach and small bowel. There is is high-grade small bowel obstruction with minimal contrast entering the small bowel below the diaphragm. No leak identified.  11/20/15 - DG Chest Port 1 View - Patchy bilateral lower lobe opacities, likely reflecting compressive atelectasis when correlating with recent CT, although increased. Superimposed left lower lobe pneumonia is not excluded. Small bilateral pleural effusions, increased. Large hiatal hernia.  Procedures Dr. Lurena Joiner Kinsinger (11/21/15) - Open reduction of hiatal hernia repair of hiatal hernia, resection of small bowel with anastomosis, placement of gastrostomy tube, placement of duodenostomy tube.   Hospital Course:   65 y.o. male with history of diverticulitis s/p colostomy and now s/p antrectomy with B2 anastomosis on 10/30/15 for obstructing antral ulcer who presented to [facility] with abdominal pain, decreased ostomy output, fevers and nausea. Physical exam was not significant for peritonitis or post-operative wound infection and Imaging (above) showed no post-operative leak and a stable hiatal hernia based on previous imaging. Patient was admitted with possible enteritis in gastro-jejunostomy limb for aggressive IV fluid rehydration and IV antibiotics, as well as consult by urology, who recommended tamsulosin, straining urine for stones, and outpatient follow-up.   He was started on TNA through a PICC on hospital day (HD) #1. On HD#5 the patient had persistent fever and poor oral intake so blood cultures were repeated, antibiotic coverage was broadened, and ID was consulted for recommendations. An UGI was repeated on 8/24 and surgical team decided to proceed with surgical reduction of hiatal hernia to reduce hernia and relieve small bowel obstruction, as well as gastrostomy tube placement as a fixation point for stomach.   During surgery, patient was found to have purulence behind his liver (cultured) which appeared to come  from the duodenal stump as  well as a small bowel perforation about 30 cm distal to the gastrojejunostomy. The perforated segment was resected. His hiatal hernia was reduced and repaired. 2 66 F blake drains, as well as a duodenostomy tube, were placed during surgery.   Mr. Seefried was hypotensive after surgery and critical care was asked to evaluate. Patient experienced respiratory distress in the PACU and was re-intubated by anesthesia for ongoing critical care support. He was extubated on POD#1 and critical care medicine signed off on POD#2. Post-op laparotomy wound was assessed on POD#3 and BID wet-to-dry dressing changes were continued. G-tube was clamped on POD#5, which the patient tolerated well. Patient had a poor appetite but his diet was advanced as tolerated. His TPN was weaned on POD#12 and discontinued completely on POD#13. On POD#14, the patient was voiding well, tolerating diet, ambulating well, pain well controlled, vital signs stable, having good ostomy output, laparotomy incision c/d/i and felt stable for discharge home. His PICC line was removed. One blake drain was discontinued at discharge and the patient was discharged home with one 2F blake drain, duodenostomy tube, and clamped g-tube. He will receive home health for drain and wound care. Patient will follow up in our office with Dr. Kieth Brightly in 2 weeks and knows to call with questions or concerns. He should also schedule a follow-up appointment with Urology.     Medication List    STOP taking these medications   clindamycin 300 MG capsule Commonly known as:  CLEOCIN   diazepam 2 MG tablet Commonly known as:  VALIUM   oxyCODONE-acetaminophen 5-325 MG tablet Commonly known as:  PERCOCET/ROXICET     TAKE these medications   acetaminophen 500 MG tablet Commonly known as:  TYLENOL Take 1 tablet (500 mg total) by mouth every 8 (eight) hours as needed for mild pain (for pain). What changed:  how much to take  reasons to take this   aspirin EC 81  MG tablet Take 81 mg by mouth daily after supper.   famotidine 20 MG tablet Commonly known as:  PEPCID Take 1 tablet (20 mg total) by mouth 2 (two) times daily.   feeding supplement (ENSURE ENLIVE) Liqd Take 237 mLs by mouth 2 (two) times daily between meals.   ferrous sulfate 325 (65 FE) MG tablet Take 325 mg by mouth daily.   FIBER-LAX PO Take 2 capsules by mouth 2 (two) times daily as needed (constipation).   Fish Oil 1200 MG Caps Take 2,400 mg by mouth daily after supper.   HYDROcodone-acetaminophen 5-325 MG tablet Commonly known as:  NORCO/VICODIN Take 1-2 tablets by mouth every 4 (four) hours as needed for moderate pain or severe pain.   methocarbamol 750 MG tablet Commonly known as:  ROBAXIN Take 1 tablet (750 mg total) by mouth every 8 (eight) hours as needed for muscle spasms.   multivitamin with minerals Tabs tablet Take 1 tablet by mouth daily after supper.   ondansetron 4 MG tablet Commonly known as:  ZOFRAN Take 4 mg by mouth every 8 (eight) hours as needed for nausea or vomiting.   polyethylene glycol packet Commonly known as:  MIRALAX / GLYCOLAX Take 8.5-17 g by mouth 2 (two) times daily as needed (constipation). Mix in 8 oz liquid and drink   zolpidem 10 MG tablet Commonly known as:  AMBIEN Take 5 mg by mouth See admin instructions. Take 1/2 tablet (5 mg) by mouth daily at bedtime, may also take another 1/2 tablet if needed during  the night       Follow-up Information    Call today Bernestine Amass, MD.   Specialty:  Urology Why:  For an appointment in 1-2 weeks when you get home. Contact information: [place] [address] [phone]        Mickeal Skinner, MD. Go on 12/19/2015.   Specialty:  General Surgery Why:  your appointment for post-operative follow up is at 11:45 AM, please arrive 30 minutes early to get checked in and fill out any necessary paperwork. Contact information: [place]  [address] [phone]          Signed: Obie Dredge, [facility] 12/05/2015, 12:53 PM Pager: [phone] Consults: [phone] Mon-Fri 7:00 am-4:30 pm Sat-Sun 7:00 am-11:30 am

## 2015-12-05 NOTE — Discharge Instructions (Signed)
[facility]      [facility] °[phone] ° °OPEN ABDOMINAL SURGERY: POST OP INSTRUCTIONS ° °Always review your discharge instruction sheet given to you by the facility where your surgery was performed. ° °IF YOU HAVE DISABILITY OR FAMILY LEAVE FORMS, YOU MUST BRING THEM TO THE OFFICE FOR PROCESSING.  PLEASE DO NOT GIVE THEM TO YOUR DOCTOR. ° °1. A prescription for pain medication may be given to you upon discharge.  Take your pain medication as prescribed, if needed.  If narcotic pain medicine is not needed, then you may take acetaminophen (Tylenol) or ibuprofen (Advil) as needed. °2. Take your usually prescribed medications unless otherwise directed. °3. If you need a refill on your pain medication, please contact your pharmacy. They will contact our office to request authorization.  Prescriptions will not be filled after 5pm or on week-ends. °4. You should follow a light diet the first few days after arrival home, such as soup and crackers, pudding, etc.unless your doctor has advised otherwise. A high-fiber, low fat diet can be resumed as tolerated.   Be sure to include lots of fluids daily. Most patients will experience some swelling and bruising on the chest and neck area.  Ice packs will help.  Swelling and bruising can take several days to resolve °5. Most patients will experience some swelling and bruising in the area of the incision. Ice pack will help. Swelling and bruising can take several days to resolve..  °6. It is common to experience some constipation if taking pain medication after surgery.  Increasing fluid intake and taking a stool softener will usually help or prevent this problem from occurring.  A mild laxative (Milk of Magnesia or Miralax) should be taken according to package directions if there are no bowel movements after 48 hours. °7.  You may have steri-strips (small skin tapes) in place directly over the incision.  These strips should be left on the skin for 7-10 days.  If your  surgeon used skin glue on the incision, you may shower in 24 hours.  The glue will flake off over the next 2-3 weeks.  Any sutures or staples will be removed at the office during your follow-up visit. You may find that a light gauze bandage over your incision may keep your staples from being rubbed or pulled. You may shower and replace the bandage daily. °8. ACTIVITIES:  You may resume regular (light) daily activities beginning the next day--such as daily self-care, walking, climbing stairs--gradually increasing activities as tolerated.  You may have sexual intercourse when it is comfortable.  Refrain from any heavy lifting or straining until approved by your doctor. °a. You may drive when you no longer are taking prescription pain medication, you can comfortably wear a seatbelt, and you can safely maneuver your car and apply brakes °b. Return to Work: ___________________________________ °9. You should see your doctor in the office for a follow-up appointment approximately two weeks after your surgery.  Make sure that you call for this appointment within a day or two after you arrive home to insure a convenient appointment time. °OTHER INSTRUCTIONS:  °_____________________________________________________________ °_____________________________________________________________ ° °WHEN TO CALL YOUR DOCTOR: °1. Fever over 101.0 °2. Inability to urinate °3. Nausea and/or vomiting °4. Extreme swelling or bruising °5. Continued bleeding from incision. °6. Increased pain, redness, or drainage from the incision. °7. Difficulty swallowing or breathing °8. Muscle cramping or spasms. °9. Numbness or tingling in hands or feet or around lips. ° °The clinic staff is available to   answer your questions during regular business hours.  Please dont hesitate to call and ask to speak to one of the nurses if you have concerns.  For further questions, please visit [email or web address]   Care of a Feeding Tube People who  have trouble swallowing or cannot take food or medicine by mouth are sometimes given feeding tubes. A feeding tube can go into the nose and down to the stomach or through the skin in the abdomen and into the stomach or small bowel. Some of the names of these feeding tubes are gastrostomy tubes, PEG lines, nasogastric tubes, and gastrojejunostomy tubes.  SUPPLIES NEEDED TO CARE FOR THE TUBE SITE  Clean gloves.  Clean wash cloth, gauze pads, or soft paper towel.  Cotton swabs.  Skin barrier ointment or cream.  Soap and water.  Pre-cut foam pads or gauze (that go around the tube).  Tube tape. TUBE SITE CARE 1. Have all supplies ready and available. 2. Wash hands well. 3. Put on clean gloves. 4. Remove the soiled foam pad or gauze, if present, that is found under the tube stabilizer. Change the foam pad or gauze daily or when soiled or moist. 5. Check the skin around the tube site for redness, rash, swelling, drainage, or extra tissue growth. If you notice any of these, call your caregiver. 6. Moisten gauze and cotton swabs with water and soap. 7. Wipe the area closest to the tube (right near the stoma) with cotton swabs. Wipe the surrounding skin with moistened gauze. Rinse with water. 8. Dry the skin and stoma site with a dry gauze pad or soft paper towel. Do not use antibiotic ointments at the tube site. 9. If the skin is red, apply a skin barrier cream or ointment (such as petroleum jelly) in a circular motion, using a cotton swab. The cream or ointment will provide a moisture barrier for the skin and helps with wound healing. 10. Apply a new pre-cut foam pad or gauze around the tube. Secure it with tape around the edges. If no drainage is present, foam pads or gauze may be left off. 11. Use tape or an anchoring device to fasten the feeding tube to the skin for comfort or as directed. Rotate where you tape the tube to avoid skin damage from the adhesive. 12. Position the person in a  semi-upright position (30-45 degree angle). 13. Throw away used supplies. 14. Remove gloves. 15. Wash hands. SUPPLIES NEEDED TO FLUSH A FEEDING TUBE  Clean gloves.  60 mL syringe (that connects to the feeding tube).  Towel.  Water. FLUSHING A FEEDING TUBE  1. Have all supplies ready and available. 2. Wash hands well. 3. Put on clean gloves. 4. Draw up 30 mL of water in the syringe. 5. Kink the feeding tube while disconnecting it from the feeding-bag tubing or while removing the plug at the end of the tube. Kinking closes the tube and prevents secretions in the tube from spilling out. 6. Insert the tip of the syringe into the end of the feeding tube. Release the kink. Slowly inject the water. 7. If unable to inject the water, the person with the feeding tube should lay on his or her left side. The tip of the tube may be against the stomach wall, blocking fluid flow. Changing positions may move the tip away from the stomach wall. After repositioning, try injecting the water again. 8. After injecting the water, remove the syringe. 9. Always flush before giving the first medicine, between medicines,  and after the final medicine before starting a feeding. This prevents medicines from clogging the tube. 10. Throw away used supplies. 11. Remove gloves. 12. Wash hands.   This information is not intended to replace advice given to you by your health care provider. Make sure you discuss any questions you have with your health care provider.   Document Released: 03/15/2005 Document Revised: 03/01/2012 Document Reviewed: 10/28/2011 Elsevier Interactive Patient Education 2016 [place] Meal Plan A soft-food meal plan includes foods that are safe and easy to swallow. This meal plan typically is used:  If you are having trouble chewing or swallowing foods.  As a transition meal plan after only having had liquid meals for a long period. WHAT DO I NEED TO KNOW ABOUT THE SOFT-FOOD  MEAL PLAN? A soft-food meal plan includes tender foods that are soft and easy to chew and swallow. In most cases, bite-sized pieces of food are easier to swallow. A bite-sized piece is about  inch or smaller. Foods in this plan do not need to be ground or pureed. Foods that are very hard, crunchy, or sticky should be avoided. Also, breads, cereals, yogurts, and desserts with nuts, seeds, or fruits should be avoided. WHAT FOODS CAN I EAT? Grains Rice and wild rice. Moist bread, dressing, pasta, and noodles. Well-moistened dry or cooked cereals, such as farina (cooked wheat cereal), oatmeal, or grits. Biscuits, breads, muffins, pancakes, and waffles that have been well moistened. Vegetables Shredded lettuce. Cooked, tender vegetables, including potatoes without skins. Vegetable juices. Broths or creamed soups made with vegetables that are not stringy or chewy. Strained tomatoes (without seeds). Fruits Canned or well-cooked fruits. Soft (ripe), peeled fresh fruits, such as peaches, nectarines, kiwi, cantaloupe, honeydew melon, and watermelon (without seeds). Soft berries with small seeds, such as strawberries. Fruit juices (without pulp). Meats and Other Protein Sources Moist, tender, lean beef. Mutton. Lamb. Veal. Chicken. [place]. Liver. Ham. Fish without bones. Eggs. Dairy Milk, milk drinks, and cream. Plain cream cheese and cottage cheese. Plain yogurt. Sweets/Desserts Flavored gelatin desserts. Custard. Plain ice cream, frozen yogurt, sherbet, milk shakes, and malts. Plain cakes and cookies. Plain hard candy.  Other Butter, margarine (without trans fat), and cooking oils. Mayonnaise. Cream sauces. Mild spices, salt, and sugar. Syrup, molasses, honey, and jelly. The items listed above may not be a complete list of recommended foods or beverages. Contact your dietitian for more options. WHAT FOODS ARE NOT RECOMMENDED? Grains Dry bread, toast, crackers that have not been moistened. Coarse or dry  cereals, such as bran, granola, and shredded wheat. Tough or chewy crusty breads, such as [place] bread or baguettes. Vegetables Corn. Raw vegetables except shredded lettuce. Cooked vegetables that are tough or stringy. Tough, crisp, fried potatoes and potato skins. Fruits Fresh fruits with skins or seeds or both, such as apples, pears, or grapes. Stringy, high-pulp fruits, such as papaya, pineapple, coconut, or mango. Fruit leather, fruit roll-ups, and all dried fruits. Meats and Other Protein Sources Sausages and hot dogs. Meats with gristle. Fish with bones. Nuts, seeds, and chunky peanut or other nut butters. Sweets/Desserts Cakes or cookies that are very dry or chewy.  The items listed above may not be a complete list of foods and beverages to avoid. Contact your dietitian for more information.   This information is not intended to replace advice given to you by your health care provider. Make sure you discuss any questions you have with your health care provider.   Document Released: 06/22/2007 Document  Revised: 03/20/2013 Document Reviewed: 02/09/2013 Elsevier Interactive Patient Education [employer].

## 2015-12-05 NOTE — Care Management (Signed)
Home health orders given to Abby with [facility]. Spoke to patient's wife yesterday late afternoon afternoon, she is in agreement with discharge plan and also agrees patient does not need a walker at this time .  Magdalen Spatz RN BSN [phone]

## 2015-12-05 NOTE — Progress Notes (Signed)
Discharge paperwork given to patient. No questions verbalized. Prescriptions given. No questions verbalized.

## 2015-12-08 DIAGNOSIS — Z48815 Encounter for surgical aftercare following surgery on the digestive system: Secondary | ICD-10-CM | POA: Diagnosis not present

## 2015-12-08 DIAGNOSIS — I10 Essential (primary) hypertension: Secondary | ICD-10-CM | POA: Diagnosis not present

## 2015-12-08 DIAGNOSIS — K5793 Diverticulitis of intestine, part unspecified, without perforation or abscess with bleeding: Secondary | ICD-10-CM | POA: Diagnosis not present

## 2015-12-08 MED FILL — ONDANSETRON HCL 4 MG TABLET: 4 | 5 days supply | Qty: 20 | Fill #0

## 2015-12-09 ENCOUNTER — Other Ambulatory Visit: Payer: Self-pay | Admitting: *Deleted

## 2015-12-09 DIAGNOSIS — K5793 Diverticulitis of intestine, part unspecified, without perforation or abscess with bleeding: Secondary | ICD-10-CM | POA: Diagnosis not present

## 2015-12-09 DIAGNOSIS — Z48815 Encounter for surgical aftercare following surgery on the digestive system: Secondary | ICD-10-CM | POA: Diagnosis not present

## 2015-12-09 DIAGNOSIS — I10 Essential (primary) hypertension: Secondary | ICD-10-CM | POA: Diagnosis not present

## 2015-12-09 NOTE — Patient Outreach (Addendum)
[facility]) Care Management  12/09/2015  Miguel [name] 11-22-1950 [id number]   Subjective: Telephone call to patient's home number [phone]), no answer, left HIPAA compliant voicemail message, and requested call back. Telephone call to patient's mobile number [phone]), no answer, left HIPAA compliant voicemail message, and requested call back.    Objective: Per chart review: Patient hospitalized 11/13/15 -12/05/15 with Postoperative fever, acute kidney injury, severe protein malnutrition, and sepsis.   Status post Open reduction of hiatal hernia repair of hiatal hernia, resection of small bowel with anastomosis, placement of gastrostomy tube, placement of duodenostomy tube on 11/21/15.   Patient hospitalized 10/27/15 - 11/09/15 for GI bleed, hiatal hernia, acute blood loss anemia, and gastric ulcer. Patient has a history of diverticulosis with perforation, hyperlipidemia, and chronic kidney disease. Patient status post REPAIR OF BLEEDING ULCER PARTIAL DISTAL GASTRECTOMY WITH BILROTH II RECONSTRUCTION/GASTROJEJUNOSTOMY on 10/30/15. Ostomy reversal pending.   Assessment: Received [facility] Transition of care referral on 11/17/15. Transition of care screening / follow up,  pending patient contact.    Plan: RNCM will call patient for 2nd telephonic outreach attempt, telephone screen / transition of care follow up, within 10 business days, if no return call.   Zephyr Sausedo H. Annia Friendly, BSN, [facility] Telephonic CM Phone: [phone] Fax: [phone]

## 2015-12-10 ENCOUNTER — Other Ambulatory Visit: Payer: Self-pay | Admitting: *Deleted

## 2015-12-10 ENCOUNTER — Ambulatory Visit: Payer: Self-pay | Admitting: *Deleted

## 2015-12-10 DIAGNOSIS — I10 Essential (primary) hypertension: Secondary | ICD-10-CM | POA: Diagnosis not present

## 2015-12-10 DIAGNOSIS — K5793 Diverticulitis of intestine, part unspecified, without perforation or abscess with bleeding: Secondary | ICD-10-CM | POA: Diagnosis not present

## 2015-12-10 DIAGNOSIS — Z48815 Encounter for surgical aftercare following surgery on the digestive system: Secondary | ICD-10-CM | POA: Diagnosis not present

## 2015-12-10 NOTE — Patient Outreach (Addendum)
[name]) Care Management  12/10/2015  Miguel [name] Oct 01, 1950 [id number]  Subjective: Received voicemail message from patient and patient's wife, stating they are returning call and request call back. Telephone call from patient and HIPAA verified.   Patient states he remembers speaking with this RNCM in the past and is familiar with [facility] services.  Patient in agreement to complete transition of care follow up.  Patient gave [facility] verbal authorization to speak with wife [name] regarding healthcare needs as needed.  Patient states he is doing fair, has nausea, poor appetite, and is a lot weaker after this hospitalization this time.  States he is receiving home health services through [employer].  States the home health nurse is addressing with MD, ongoing nausea issue, and additional services for home health aide to assist with activities of daily living.  Patient states he does not have any additional concerns for RNCM to address and requested RNCM call his wife to see if she has any questions or concerns.   States he is very Patent attorney of RNCM's follow up call.    Telephone call to patient's wife mobile number, per patient's request, no answer, left HIPAA compliant voicemail message, and requested call back. Received voicemail message from patient's wife, states she is returning call, and requested call back. Telephone call to patient's wife mobile number, per patient's request, no answer, left HIPAA compliant voicemail message, and requested call back. Telephone call from patient's wife, verified patient's name, date of birth, and address.  RNCM advised wife of conversation with patient and patient's follow up request.  Wife states home health nurse is visiting patient on a regular basis and patient has not had a fever.  Wife states she does not have any questions for [facility] and patient has no additional [facility] care management needs at this time.    States she is very appreciative of the follow up call,  has RNCM's contact information, and does not need successful outreach letter, [facility] pamphlet, or magnet resent.  Patient does not have any transition of care, care coordination, disease management, disease monitoring, transportation, community resource, or pharmacy needs at this time.   Objective: Per chart review: Patient hospitalized 11/13/15 -12/05/15 with Postoperative fever, acute kidney injury, severe protein malnutrition, and sepsis.   Status post Open reduction of hiatal hernia repair of hiatal hernia, resection of small bowel with anastomosis,placement of gastrostomy tube, placement of duodenostomy tube on 11/21/15.   Patient hospitalized 10/27/15 - 11/09/15 for GI bleed, hiatal hernia, acute blood loss anemia, and gastric ulcer. Patient has a history of diverticulosis with perforation, hyperlipidemia, and chronic kidney disease. Patient status post REPAIR OF BLEEDING ULCER PARTIAL DISTAL GASTRECTOMY WITH BILROTH II RECONSTRUCTION/GASTROJEJUNOSTOMY on 10/30/15. Ostomy reversal pending.   Assessment: Received [facility] Transition of care referral on 11/17/15. Transition of care screening / follow up completed and patient has no additional care management needs at this time.    Will proceed with case closure.    Plan: RNCM will send case closure due to follow up completed / no care management needs to [facility].   Shruti Arrey H. Annia Friendly, BSN, [place] [facility] Telephonic CM Phone: [phone] Fax: [phone]

## 2015-12-11 DIAGNOSIS — K5793 Diverticulitis of intestine, part unspecified, without perforation or abscess with bleeding: Secondary | ICD-10-CM | POA: Diagnosis not present

## 2015-12-11 DIAGNOSIS — I10 Essential (primary) hypertension: Secondary | ICD-10-CM | POA: Diagnosis not present

## 2015-12-11 DIAGNOSIS — Z48815 Encounter for surgical aftercare following surgery on the digestive system: Secondary | ICD-10-CM | POA: Diagnosis not present

## 2015-12-12 ENCOUNTER — Encounter (HOSPITAL_COMMUNITY): Payer: Self-pay | Admitting: Emergency Medicine

## 2015-12-12 ENCOUNTER — Emergency Department (HOSPITAL_COMMUNITY): Payer: 59

## 2015-12-12 ENCOUNTER — Inpatient Hospital Stay (HOSPITAL_COMMUNITY)
Admission: EM | Admit: 2015-12-12 | Discharge: 2015-12-24 | DRG: 640 | Disposition: A | Discharge status: Home Health | Payer: 59 | Attending: General Surgery | Admitting: General Surgery

## 2015-12-12 DIAGNOSIS — D72829 Elevated white blood cell count, unspecified: Secondary | ICD-10-CM | POA: Diagnosis present

## 2015-12-12 DIAGNOSIS — E43 Unspecified severe protein-calorie malnutrition: Secondary | ICD-10-CM | POA: Diagnosis present

## 2015-12-12 DIAGNOSIS — K219 Gastro-esophageal reflux disease without esophagitis: Secondary | ICD-10-CM | POA: Diagnosis present

## 2015-12-12 DIAGNOSIS — Z6825 Body mass index (BMI) 25.0-25.9, adult: Secondary | ICD-10-CM | POA: Diagnosis not present

## 2015-12-12 DIAGNOSIS — F419 Anxiety disorder, unspecified: Secondary | ICD-10-CM | POA: Diagnosis present

## 2015-12-12 DIAGNOSIS — R55 Syncope and collapse: Secondary | ICD-10-CM | POA: Diagnosis present

## 2015-12-12 DIAGNOSIS — R404 Transient alteration of awareness: Secondary | ICD-10-CM | POA: Diagnosis not present

## 2015-12-12 DIAGNOSIS — Z79899 Other long term (current) drug therapy: Secondary | ICD-10-CM

## 2015-12-12 DIAGNOSIS — Z933 Colostomy status: Secondary | ICD-10-CM

## 2015-12-12 DIAGNOSIS — R638 Other symptoms and signs concerning food and fluid intake: Secondary | ICD-10-CM

## 2015-12-12 DIAGNOSIS — Z85820 Personal history of malignant melanoma of skin: Secondary | ICD-10-CM | POA: Diagnosis not present

## 2015-12-12 DIAGNOSIS — K435 Parastomal hernia without obstruction or  gangrene: Secondary | ICD-10-CM | POA: Diagnosis present

## 2015-12-12 DIAGNOSIS — Z87891 Personal history of nicotine dependence: Secondary | ICD-10-CM

## 2015-12-12 DIAGNOSIS — R739 Hyperglycemia, unspecified: Secondary | ICD-10-CM | POA: Diagnosis present

## 2015-12-12 DIAGNOSIS — E860 Dehydration: Principal | ICD-10-CM | POA: Diagnosis present

## 2015-12-12 DIAGNOSIS — Z48815 Encounter for surgical aftercare following surgery on the digestive system: Secondary | ICD-10-CM | POA: Diagnosis not present

## 2015-12-12 DIAGNOSIS — K5793 Diverticulitis of intestine, part unspecified, without perforation or abscess with bleeding: Secondary | ICD-10-CM | POA: Diagnosis not present

## 2015-12-12 DIAGNOSIS — K30 Functional dyspepsia: Secondary | ICD-10-CM | POA: Diagnosis present

## 2015-12-12 DIAGNOSIS — I10 Essential (primary) hypertension: Secondary | ICD-10-CM | POA: Diagnosis present

## 2015-12-12 DIAGNOSIS — R918 Other nonspecific abnormal finding of lung field: Secondary | ICD-10-CM | POA: Diagnosis not present

## 2015-12-12 DIAGNOSIS — K5720 Diverticulitis of large intestine with perforation and abscess without bleeding: Secondary | ICD-10-CM | POA: Diagnosis not present

## 2015-12-12 DIAGNOSIS — E785 Hyperlipidemia, unspecified: Secondary | ICD-10-CM | POA: Diagnosis present

## 2015-12-12 DIAGNOSIS — K449 Diaphragmatic hernia without obstruction or gangrene: Secondary | ICD-10-CM | POA: Diagnosis not present

## 2015-12-12 DIAGNOSIS — IMO0002 Reserved for concepts with insufficient information to code with codable children: Secondary | ICD-10-CM

## 2015-12-12 DIAGNOSIS — N179 Acute kidney failure, unspecified: Secondary | ICD-10-CM | POA: Diagnosis not present

## 2015-12-12 DIAGNOSIS — N200 Calculus of kidney: Secondary | ICD-10-CM | POA: Diagnosis not present

## 2015-12-12 DIAGNOSIS — R531 Weakness: Secondary | ICD-10-CM

## 2015-12-12 LAB — I-STAT CHEM 8, ED
BUN: 52 mg/dL — ABNORMAL HIGH (ref 6–20)
CALCIUM ION: 1.11 mmol/L — AB (ref 1.15–1.40)
CREATININE: 1.8 mg/dL — AB (ref 0.61–1.24)
Chloride: 101 mmol/L (ref 101–111)
GLUCOSE: 120 mg/dL — AB (ref 65–99)
HCT: 47 % (ref 39.0–52.0)
HEMOGLOBIN: 16 g/dL (ref 13.0–17.0)
Potassium: 4.8 mmol/L (ref 3.5–5.1)
Sodium: 134 mmol/L — ABNORMAL LOW (ref 135–145)
TCO2: 21 mmol/L (ref 0–100)

## 2015-12-12 LAB — CBC WITH DIFFERENTIAL/PLATELET
BASOS PCT: 0 %
Basophils Absolute: 0 10*3/uL (ref 0.0–0.1)
EOS ABS: 0.1 10*3/uL (ref 0.0–0.7)
Eosinophils Relative: 1 %
HCT: 42.6 % (ref 39.0–52.0)
HEMOGLOBIN: 13.6 g/dL (ref 13.0–17.0)
Lymphocytes Relative: 18 %
Lymphs Abs: 2.3 10*3/uL (ref 0.7–4.0)
MCH: 27.7 pg (ref 26.0–34.0)
MCHC: 31.9 g/dL (ref 30.0–36.0)
MCV: 86.8 fL (ref 78.0–100.0)
MONOS PCT: 8 %
Monocytes Absolute: 1.1 10*3/uL — ABNORMAL HIGH (ref 0.1–1.0)
NEUTROS PCT: 73 %
Neutro Abs: 9.3 10*3/uL — ABNORMAL HIGH (ref 1.7–7.7)
Platelets: 638 10*3/uL — ABNORMAL HIGH (ref 150–400)
RBC: 4.91 MIL/uL (ref 4.22–5.81)
RDW: 17 % — ABNORMAL HIGH (ref 11.5–15.5)
WBC: 12.9 10*3/uL — AB (ref 4.0–10.5)

## 2015-12-12 LAB — COMPREHENSIVE METABOLIC PANEL
ALK PHOS: 412 U/L — AB (ref 38–126)
ALT: 59 U/L (ref 17–63)
AST: 43 U/L — ABNORMAL HIGH (ref 15–41)
Albumin: 3.4 g/dL — ABNORMAL LOW (ref 3.5–5.0)
Anion gap: 14 (ref 5–15)
BILIRUBIN TOTAL: 1 mg/dL (ref 0.3–1.2)
BUN: 55 mg/dL — ABNORMAL HIGH (ref 6–20)
CALCIUM: 9.9 mg/dL (ref 8.9–10.3)
CO2: 20 mmol/L — AB (ref 22–32)
CREATININE: 1.65 mg/dL — AB (ref 0.61–1.24)
Chloride: 101 mmol/L (ref 101–111)
GFR, EST AFRICAN AMERICAN: 49 mL/min — AB (ref >60)
GFR, EST NON AFRICAN AMERICAN: 42 mL/min — AB (ref >60)
Glucose, Bld: 123 mg/dL — ABNORMAL HIGH (ref 65–99)
Potassium: 5.1 mmol/L (ref 3.5–5.1)
Sodium: 135 mmol/L (ref 135–145)
Total Protein: 8.8 g/dL — ABNORMAL HIGH (ref 6.5–8.1)

## 2015-12-12 LAB — I-STAT CG4 LACTIC ACID, ED: Lactic Acid, Venous: 1.77 mmol/L (ref 0.5–1.9)

## 2015-12-12 LAB — URINALYSIS, ROUTINE W REFLEX MICROSCOPIC
BILIRUBIN URINE: NEGATIVE
GLUCOSE, UA: NEGATIVE mg/dL
HGB URINE DIPSTICK: NEGATIVE
KETONES UR: NEGATIVE mg/dL
LEUKOCYTES UA: NEGATIVE
Nitrite: NEGATIVE
PROTEIN: 30 mg/dL — AB
Specific Gravity, Urine: 1.022 (ref 1.005–1.030)
pH: 5.5 (ref 5.0–8.0)

## 2015-12-12 LAB — GLUCOSE, CAPILLARY: Glucose-Capillary: 121 mg/dL — ABNORMAL HIGH (ref 65–99)

## 2015-12-12 LAB — URINE MICROSCOPIC-ADD ON
BACTERIA UA: NONE SEEN
RBC / HPF: NONE SEEN RBC/hpf (ref 0–5)
WBC, UA: NONE SEEN WBC/hpf (ref 0–5)

## 2015-12-12 LAB — I-STAT TROPONIN, ED: TROPONIN I, POC: 0 ng/mL (ref 0.00–0.08)

## 2015-12-12 MED ORDER — ONDANSETRON HCL 4 MG/2ML IJ SOLN
4.0000 mg | Freq: Four times a day (QID) | INTRAMUSCULAR | Status: DC | PRN
  Administered 2015-12-18 – 2015-12-20 (×3): 4 mg via INTRAVENOUS
  Filled 2015-12-12 (×4): qty 2

## 2015-12-12 MED ORDER — ACETAMINOPHEN 325 MG PO TABS: 650.0000 mg | ORAL_TABLET | Freq: Four times a day (QID) | ORAL | Status: DC | PRN

## 2015-12-12 MED ORDER — SODIUM CHLORIDE 0.9 % IV SOLN
INTRAVENOUS | Status: DC
  Administered 2015-12-12 – 2015-12-21 (×17): via INTRAVENOUS

## 2015-12-12 MED ORDER — VITAL HIGH PROTEIN PO LIQD
1000.0000 mL | ORAL | Status: DC
  Administered 2015-12-12: 1000 mL
  Filled 2015-12-12 (×2): qty 1000

## 2015-12-12 MED ORDER — ACETAMINOPHEN 650 MG RE SUPP: 650.0000 mg | Freq: Four times a day (QID) | RECTAL | Status: DC | PRN

## 2015-12-12 MED ORDER — ONDANSETRON 4 MG PO TBDP
4.0000 mg | ORAL_TABLET | Freq: Four times a day (QID) | ORAL | Status: DC | PRN
  Administered 2015-12-20: 4 mg via ORAL
  Filled 2015-12-12: qty 1

## 2015-12-12 MED ORDER — SODIUM CHLORIDE 0.9 % IV BOLUS (SEPSIS)
1000.0000 mL | Freq: Once | INTRAVENOUS | Status: AC
  Administered 2015-12-12: 1000 mL via INTRAVENOUS

## 2015-12-12 MED ORDER — ZOLPIDEM TARTRATE 5 MG PO TABS
5.0000 mg | ORAL_TABLET | Freq: Every evening | ORAL | Status: DC | PRN
  Administered 2015-12-15 – 2015-12-23 (×10): 5 mg via ORAL
  Filled 2015-12-12 (×11): qty 1

## 2015-12-12 MED ORDER — MORPHINE SULFATE (PF) 2 MG/ML IV SOLN
2.0000 mg | INTRAVENOUS | Status: DC | PRN
  Administered 2015-12-12 – 2015-12-14 (×8): 2 mg via INTRAVENOUS
  Filled 2015-12-12 (×8): qty 1

## 2015-12-12 MED ORDER — PIPERACILLIN-TAZOBACTAM 3.375 G IVPB
3.3750 g | Freq: Three times a day (TID) | INTRAVENOUS | Status: DC
  Administered 2015-12-13 – 2015-12-20 (×23): 3.375 g via INTRAVENOUS
  Filled 2015-12-12 (×24): qty 50

## 2015-12-12 MED ORDER — HEPARIN SODIUM (PORCINE) 5000 UNIT/ML IJ SOLN
5000.0000 [IU] | Freq: Three times a day (TID) | INTRAMUSCULAR | Status: DC
  Administered 2015-12-12 – 2015-12-24 (×34): 5000 [IU] via SUBCUTANEOUS
  Filled 2015-12-12 (×30): qty 1

## 2015-12-12 MED ORDER — DIPHENHYDRAMINE HCL 12.5 MG/5ML PO ELIX: 12.5000 mg | ORAL_SOLUTION | Freq: Four times a day (QID) | ORAL | Status: DC | PRN

## 2015-12-12 MED ORDER — HYDROCODONE-ACETAMINOPHEN 5-325 MG PO TABS
1.0000 | ORAL_TABLET | ORAL | Status: DC | PRN
Start: 2015-12-12 — End: 2015-12-15
  Filled 2015-12-12: qty 2

## 2015-12-12 MED ORDER — ZOLPIDEM TARTRATE 5 MG PO TABS: 5.0000 mg | ORAL_TABLET | ORAL | Status: DC

## 2015-12-12 MED ORDER — PIPERACILLIN-TAZOBACTAM 3.375 G IVPB 30 MIN
3.3750 g | Freq: Once | INTRAVENOUS | Status: AC
  Administered 2015-12-12: 3.375 g via INTRAVENOUS
  Filled 2015-12-12: qty 50

## 2015-12-12 MED ORDER — DIPHENHYDRAMINE HCL 50 MG/ML IJ SOLN
12.5000 mg | Freq: Four times a day (QID) | INTRAMUSCULAR | Status: DC | PRN
  Filled 2015-12-12: qty 1

## 2015-12-12 MED ORDER — SIMETHICONE 80 MG PO CHEW
40.0000 mg | CHEWABLE_TABLET | Freq: Four times a day (QID) | ORAL | Status: DC | PRN
  Administered 2015-12-18 – 2015-12-19 (×2): 40 mg via ORAL
  Filled 2015-12-12 (×2): qty 1

## 2015-12-12 NOTE — Progress Notes (Signed)
Attempted to obtain report. Awaiting return call.

## 2015-12-12 NOTE — Progress Notes (Signed)
Pharmacy Antibiotic Note  DEKLEN HEYDON is a 65 y.o. male admitted on 12/12/2015 with intra-abdominal infection.  Pharmacy has been consulted for zosyn dosing.  Plan: Zosyn 3.375g IV q8h (4 hour infusion).  Monitor culture data, renal function and clinical course     Temp (24hrs), Avg:98 F (36.7 C), Min:98 F (36.7 C), Max:98 F (36.7 C)   Recent Labs Lab 12/12/15 1549 12/12/15 1556  WBC 12.9*  --   CREATININE 1.65* 1.80*  LATICACIDVEN  --  1.77    CrCl cannot be calculated (Unknown ideal weight.).    No Active Allergies  Antimicrobials this admission: Zosyn 9/16 >>   Dose adjustments this admission:   Microbiology results:  BCx:   UCx:    Sputum:    MRSA PCR:    Andrey Cota. Diona Foley, PharmD, BCPS Clinical Pharmacist Pager [phone] 12/12/2015 5:46 PM

## 2015-12-12 NOTE — H&P (Signed)
Miguel [name] is an 65 y.o. male.   Chief Complaint: fatigue and weight loss HPI: 65 year old male underwent open reduction of hiatal hernia with small bowel resection and repair of hiatal hernia for acutely incarcerated hernia leading to perforation of small intestine. Postoperatively he did fairly well as able tolerate food and eventually was discharged without G-tube feeds. Since then he has had increasing amounts of nausea not controlled with typical oral medications. He's had significant weight loss since discharge from hospital. He notes previously being able to walk around the entire surgical for now having trouble getting the strength to walk to the restroom. He has not been vomiting or spitting up. He can tolerate small amounts of liquids. He notes that he had nauseated when trying to eat more has very poor appetite.  Past Medical History:  Diagnosis Date  . Anemia   . Anxiety   . Gastritis   . GI bleed due to NSAIDs 10/27/2015  . Hyperlipidemia   . Hypertension   . Kidney stones   . Melanoma of back ([place])    "mid back"  . Sigmoid diverticulitis    with perforation    Past Surgical History:  Procedure Laterality Date  . COLON SURGERY    . ESOPHAGOGASTRODUODENOSCOPY N/A 10/27/2015   Procedure: ESOPHAGOGASTRODUODENOSCOPY (EGD);  Surgeon: Clarene Essex, MD;  Location[facility] ENDOSCOPY;  Service: Endoscopy;  Laterality: N/A;  . ESOPHAGOGASTRODUODENOSCOPY N/A 10/29/2015   Procedure: ESOPHAGOGASTRODUODENOSCOPY (EGD);  Surgeon: Ronald Lobo, MD;  Location: [facility] ENDOSCOPY;  Service: Endoscopy;  Laterality: N/A;  . ESOPHAGOGASTRODUODENOSCOPY N/A 10/30/2015   Procedure: ESOPHAGOGASTRODUODENOSCOPY (EGD);  Surgeon: Clarene Essex, MD;  Location: [facility] ENDOSCOPY;  Service: Endoscopy;  Laterality: N/A;  . HEMORRHOID BANDING  X 2  . HIATAL HERNIA REPAIR N/A 11/21/2015   Procedure: REDUCTION OF HIATAL HERNIA , REPAIR HIATAL HERNIA, RESECTION SMALL BOWEL WITH ANASTOMOSIS, PLACEMENT GASTROSTOMY TUBE, PLACEMENT  DUODENOSTOMY TUBE;  Surgeon: Mickeal Skinner, MD;  Location: [place];  Service: General;  Laterality: N/A;  . [place]   "opened me up"  . LAPAROTOMY N/A 07/05/2015   Procedure: PARTIAL SIGMOID COLECTOMY AND COLOSTOMY;  Surgeon: [name], MD;  Location: [place];  Service: General;  Laterality: N/A;  . MELANOMA EXCISION  2001  . 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  10/30/2015   Procedure: REMOVAL OF DISTAL STOMACH;  Surgeon: Judeth Horn, MD;  Location: [place];  Service: General;;  . REPAIR OF PERFORATED ULCER N/A 10/30/2015   Procedure: REPAIR OF BLEEDING  ULCER;  Surgeon: Judeth Horn, MD;  Location: [place];  Service: General;  Laterality: N/A;  . TUMOR EXCISION  2009   "back; fatty tumor"    Family History  Problem Relation Age of Onset  . Stroke Mother   . Stroke Brother   . Heart disease Brother    Social History:  reports that he has never smoked. He quit smokeless tobacco use about 4 months ago. His smokeless tobacco use included Snuff. He reports that he does not drink alcohol or use drugs.  Allergies:  Allergies  Allergen Reactions  . No Known Allergies      (Not in a hospital admission)  Results for orders placed or performed during the hospital encounter of 12/12/15 (from the past 48 hour(s))  Comprehensive metabolic panel     Status: Abnormal   Collection Time: 12/12/15  3:49 PM  Result Value Ref Range   Sodium 135 135 - 145 mmol/L   Potassium 5.1 3.5 -  5.1 mmol/L   Chloride 101 101 - 111 mmol/L   CO2 20 (L) 22 - 32 mmol/L   Glucose, Bld 123 (H) 65 - 99 mg/dL   BUN 55 (H) 6 - 20 mg/dL   Creatinine, Ser 1.65 (H) 0.61 - 1.24 mg/dL   Calcium 9.9 8.9 - 10.3 mg/dL   Total Protein 8.8 (H) 6.5 - 8.1 g/dL   Albumin 3.4 (L) 3.5 - 5.0 g/dL   AST 43 (H) 15 - 41 U/L   ALT 59 17 - 63 U/L   Alkaline Phosphatase 412 (H) 38 - 126 U/L   Total Bilirubin 1.0 0.3 - 1.2 mg/dL   GFR calc non Af Amer 42 (L) >60 mL/min   GFR calc Af Amer  49 (L) >60 mL/min    Comment: (NOTE) The eGFR has been calculated using the CKD EPI equation. This calculation has not been validated in all clinical situations. eGFR's persistently <60 mL/min signify possible Chronic Kidney Disease.    Anion gap 14 5 - 15  CBC with Differential     Status: Abnormal   Collection Time: 12/12/15  3:49 PM  Result Value Ref Range   WBC 12.9 (H) 4.0 - 10.5 K/uL   RBC 4.91 4.22 - 5.81 MIL/uL   Hemoglobin 13.6 13.0 - 17.0 g/dL   HCT 42.6 39.0 - 52.0 %   MCV 86.8 78.0 - 100.0 fL   MCH 27.7 26.0 - 34.0 pg   MCHC 31.9 30.0 - 36.0 g/dL   RDW 17.0 (H) 11.5 - 15.5 %   Platelets 638 (H) 150 - 400 K/uL   Neutrophils Relative % 73 %   Neutro Abs 9.3 (H) 1.7 - 7.7 K/uL   Lymphocytes Relative 18 %   Lymphs Abs 2.3 0.7 - 4.0 K/uL   Monocytes Relative 8 %   Monocytes Absolute 1.1 (H) 0.1 - 1.0 K/uL   Eosinophils Relative 1 %   Eosinophils Absolute 0.1 0.0 - 0.7 K/uL   Basophils Relative 0 %   Basophils Absolute 0.0 0.0 - 0.1 K/uL  I-stat troponin, ED     Status: None   Collection Time: 12/12/15  3:54 PM  Result Value Ref Range   Troponin i, poc 0.00 0.00 - 0.08 ng/mL   Comment 3            Comment: Due to the release kinetics of cTnI, a negative result within the first hours of the onset of symptoms does not rule out myocardial infarction with certainty. If myocardial infarction is still suspected, repeat the test at appropriate intervals.   I-Stat CG4 Lactic Acid, ED     Status: None   Collection Time: 12/12/15  3:56 PM  Result Value Ref Range   Lactic Acid, Venous 1.77 0.5 - 1.9 mmol/L  I-stat Chem 8, ED     Status: Abnormal   Collection Time: 12/12/15  3:56 PM  Result Value Ref Range   Sodium 134 (L) 135 - 145 mmol/L   Potassium 4.8 3.5 - 5.1 mmol/L   Chloride 101 101 - 111 mmol/L   BUN 52 (H) 6 - 20 mg/dL   Creatinine, Ser 1.80 (H) 0.61 - 1.24 mg/dL   Glucose, Bld 120 (H) 65 - 99 mg/dL   Calcium, Ion 1.11 (L) 1.15 - 1.40 mmol/L   TCO2 21 0 -  100 mmol/L   Hemoglobin 16.0 13.0 - 17.0 g/dL   HCT 47.0 39.0 - 52.0 %   Dg Chest Portable 1 View  Result Date: 12/12/2015  CLINICAL DATA:  Weakness and tachycardia. EXAM: PORTABLE CHEST 1 VIEW COMPARISON:  11/22/2015 CT.  Prior radiographs. FINDINGS: The cardiomediastinal silhouette is unremarkable. Left basilar opacity has decreased and probably represents atelectasis. There is no evidence of focal airspace disease, pulmonary edema, suspicious pulmonary nodule/mass, pleural effusion, or pneumothorax. No acute bony abnormalities are identified. IMPRESSION: Decreased left basilar opacity -probably atelectasis. No other significant abnormalities noted. Electronically Signed   By: Margarette Canada M.D.   On: 12/12/2015 16:02    Review of Systems  Constitutional: Positive for malaise/fatigue and weight loss. Negative for chills, diaphoresis and fever.  HENT: Negative for hearing loss.   Eyes: Negative for blurred vision and double vision.  Respiratory: Negative for cough and hemoptysis.   Cardiovascular: Negative for chest pain and palpitations.  Gastrointestinal: Positive for abdominal pain and nausea. Negative for vomiting.  Genitourinary: Negative for dysuria and urgency.  Musculoskeletal: Negative for myalgias and neck pain.  Skin: Negative for itching and rash.  Neurological: Positive for weakness. Negative for dizziness, tingling and headaches.  Endo/Heme/Allergies: Does not bruise/bleed easily.  Psychiatric/Behavioral: Negative for depression and suicidal ideas.    Blood pressure 127/96, pulse 119, temperature 98 F (36.7 C), temperature source Oral, resp. rate (!) 28, SpO2 98 %. Physical Exam  Vitals reviewed. Constitutional: He is oriented to person, place, and time. He appears cachectic.  HENT:  Head: Atraumatic.  Temporal wasting  Eyes: Conjunctivae and EOM are normal. Pupils are equal, round, and reactive to light.  Neck: Normal range of motion. Neck supple.  Cardiovascular:  Normal rate and regular rhythm.   Respiratory: Effort normal and breath sounds normal.  GI: Soft. Bowel sounds are normal. He exhibits no distension. There is no tenderness.  Upper midline incision well-healing good annulus and tissue. Right lower quadrant drain with murky dark gray fluid, right upper [place] drain with bilious fluid, and the left upper quadrant drain clamped.  Musculoskeletal: Normal range of motion.  Neurological: He is alert and oriented to person, place, and time.  Skin: Skin is warm and dry.  Psychiatric: He has a normal mood and affect. His behavior is normal.     Assessment/Plan 65 year old male 3 weeks out from reduction of incarcerated hiatal hernia with resection of small bowel placement of G-tube placement of D-tube and closure of hiatal hernia. He presents for dehydration as well as severe protein calorie malnutrition with significant weight loss since surgery. He is slight leukocytosis. He also has acute kidney injury. -Admit for IV fluid resuscitation, monitor strict ins and outs -Start trickle tube feeds -Plan for CT abdomen and pelvis once creatinine normalizes for further evaluation of possible infection or underlying pathology leading to malnutrition. -Once evaluation complete, will plan to clamp D tube to minimize fluid losses -Clear liquids okay for now -Empiric antibiotics for leukocytosis   Mickeal Skinner, MD 12/12/2015, 5:37 PM

## 2015-12-12 NOTE — ED Provider Notes (Signed)
[facility] DEPT Provider Note   CSN: [id number] Arrival date & time: 12/12/15  1527   History   Chief Complaint Chief Complaint  Patient presents with  . Fatigue   HPI   Miguel [name] is an 65 y.o. male with complex medical history including recent discharge on 12/05/15 after hospital admission for possible enteritis complicated by persistent fever/pain despite abx therapy, so was taken to the OR for surgical reduction of hiatal hernia and SBO/bowel perforation reduction. Post operative course was complicated by hypotension/respiratory distress. Ultimately he was in the hospital from 8/17-9/8, transitioned from TPN to PO diet, feeling improved, and discharged home with 2 week general surgery follow up appointment. He has a blake drain, duodenostomy tube, and g-tube in place.  He presents to the ED for evaluation of generalized weakness, progressive and persistent since discharge on 9/8. He states he has no energy. He states he gets nauseated with any PO intake now and endorses "a few" episodes of emesis. He states he has a home health nurse that comes three times a week and told him today that he needs to come to the hospital. Pt reports ostomy working fine. He has no appetite. Denies fever or chills. He states he feels dehydrated. Denies abdominal pain. Denies chest pain or SOB.   Past Medical History:  Diagnosis Date  . Anemia   . Anxiety   . Gastritis   . GI bleed due to NSAIDs 10/27/2015  . Hyperlipidemia   . Hypertension   . Kidney stones   . Melanoma of back ([place])    "mid back"  . Sigmoid diverticulitis    with perforation    Patient Active Problem List   Diagnosis Date Noted  . Postoperative fever 11/19/2015  . 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 Severe protein-calorie malnutrition ([place]) 11/17/2015  . Sepsis ([place]) 11/14/2015  . Hiccups 11/14/2015  . AKI (acute kidney injury) ([place]) 11/14/2015  . Fever   . Leg swelling   . Left shoulder pain   . Muscle spasm of  left shoulder   . GI bleed 10/27/2015  . Acute blood loss anemia 10/27/2015  . Syncope 10/27/2015  . Hyperglycemia 10/27/2015  . Hypotension 10/27/2015  . Neck pain 10/27/2015  . Hematemesis 10/27/2015  . Hematochezia 10/27/2015  . Diverticulitis of colon with perforation 07/05/2015    Past Surgical History:  Procedure Laterality Date  . COLON SURGERY    . ESOPHAGOGASTRODUODENOSCOPY N/A 10/27/2015   Procedure: ESOPHAGOGASTRODUODENOSCOPY (EGD);  Surgeon: Clarene Essex, MD;  Location: [facility] ENDOSCOPY;  Service: Endoscopy;  Laterality: N/A;  . ESOPHAGOGASTRODUODENOSCOPY N/A 10/29/2015   Procedure: ESOPHAGOGASTRODUODENOSCOPY (EGD);  Surgeon: Ronald Lobo, MD;  Location: [facility] ENDOSCOPY;  Service: Endoscopy;  Laterality: N/A;  . ESOPHAGOGASTRODUODENOSCOPY N/A 10/30/2015   Procedure: ESOPHAGOGASTRODUODENOSCOPY (EGD);  Surgeon: Clarene Essex, MD;  Location: [facility] ENDOSCOPY;  Service: Endoscopy;  Laterality: N/A;  . HEMORRHOID BANDING  X 2  . HIATAL HERNIA REPAIR N/A 11/21/2015   Procedure: REDUCTION OF HIATAL HERNIA , REPAIR HIATAL HERNIA, RESECTION SMALL BOWEL WITH ANASTOMOSIS, PLACEMENT GASTROSTOMY TUBE, PLACEMENT DUODENOSTOMY TUBE;  Surgeon: Mickeal Skinner, MD;  Location: [place];  Service: General;  Laterality: N/A;  . [place]   "opened me up"  . LAPAROTOMY N/A 07/05/2015   Procedure: PARTIAL SIGMOID COLECTOMY AND COLOSTOMY;  Surgeon: [name], MD;  Location: [place];  Service: General;  Laterality: N/A;  . MELANOMA EXCISION  2001  . 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  10/30/2015  Procedure: REMOVAL OF DISTAL STOMACH;  Surgeon: Judeth Horn, MD;  Location: [place];  Service: General;;  . REPAIR OF PERFORATED ULCER N/A 10/30/2015   Procedure: REPAIR OF BLEEDING  ULCER;  Surgeon: Judeth Horn, MD;  Location: [place];  Service: General;  Laterality: N/A;  . TUMOR EXCISION  2009   "back; fatty tumor"       Home Medications    Prior to Admission medications    Medication Sig Start Date End Date Taking? Authorizing Provider  acetaminophen (TYLENOL) 500 MG tablet Take 1 tablet (500 mg total) by mouth every 8 (eight) hours as needed for mild pain (for pain). 12/05/15   Jill Alexanders, PA-C  aspirin EC 81 MG tablet Take 81 mg by mouth daily after supper.     Historical Provider, MD  Calcium Polycarbophil (FIBER-LAX PO) Take 2 capsules by mouth 2 (two) times daily as needed (constipation).    Historical Provider, MD  famotidine (PEPCID) 20 MG tablet Take 1 tablet (20 mg total) by mouth 2 (two) times daily. 11/09/15   Elwin Mocha, MD  feeding supplement, ENSURE ENLIVE, (ENSURE ENLIVE) LIQD Take 237 mLs by mouth 2 (two) times daily between meals. 12/05/15   Darci Current Simaan, PA-C  ferrous sulfate 325 (65 FE) MG tablet Take 325 mg by mouth daily.    Historical Provider, MD  HYDROcodone-acetaminophen (NORCO/VICODIN) 5-325 MG tablet Take 1-2 tablets by mouth every 4 (four) hours as needed for moderate pain or severe pain. 12/05/15   Darci Current Simaan, PA-C  methocarbamol (ROBAXIN) 750 MG tablet Take 1 tablet (750 mg total) by mouth every 8 (eight) hours as needed for muscle spasms. 12/05/15   Jill Alexanders, PA-C  Multiple Vitamin (MULTIVITAMIN WITH MINERALS) TABS tablet Take 1 tablet by mouth daily after supper.     Historical Provider, MD  Omega-3 Fatty Acids (FISH OIL) 1200 MG CAPS Take 2,400 mg by mouth daily after supper.     Historical Provider, MD  ondansetron (ZOFRAN) 4 MG tablet Take 4 mg by mouth every 8 (eight) hours as needed for nausea or vomiting.    Historical Provider, MD  polyethylene glycol (MIRALAX / GLYCOLAX) packet Take 8.5-17 g by mouth 2 (two) times daily as needed (constipation). Mix in 8 oz liquid and drink    Historical Provider, MD  zolpidem (AMBIEN) 10 MG tablet Take 5 mg by mouth See admin instructions. Take 1/2 tablet (5 mg) by mouth daily at bedtime, may also take another 1/2 tablet if needed during the night    Historical  Provider, MD    Family History Family History  Problem Relation Age of Onset  . Stroke Mother   . Stroke Brother   . Heart disease Brother     Social History Social History  Substance Use Topics  . Smoking status: Never Smoker  . Smokeless tobacco: Former Systems developer    Types: Snuff    Quit date: 07/14/2015  . Alcohol use No     Allergies   No known allergies   Review of Systems Review of Systems 10 Systems reviewed and are negative for acute change except as noted in the HPI.  Physical Exam Updated Vital Signs BP 127/96 (BP Location: Right Arm)   Pulse 119   Temp 98 F (36.7 C) (Oral)   Resp (!) 28   SpO2 98%   Physical Exam  Constitutional: He is oriented to person, place, and time. No distress.  Unwell appearing. Frail.   HENT:  Head:  Atraumatic.  Right Ear: External ear normal.  Left Ear: External ear normal.  Nose: Nose normal.  MM dry  Eyes: Conjunctivae are normal. No scleral icterus.  Cardiovascular: Regular rhythm.   Tachycardic (110s) Pulses somewhat weak  Pulmonary/Chest: Effort normal and breath sounds normal. No respiratory distress.  Abdominal:  Abdominal drains in place and surround skin clean and dry. Ostomy bag in place. No abdominal tenderness  Neurological: He is alert and oriented to person, place, and time.  Skin: Skin is warm and dry. He is not diaphoretic. There is pallor.  Psychiatric: He has a normal mood and affect. His behavior is normal.  Nursing note and vitals reviewed.    ED Treatments / Results  Labs (all labs ordered are listed, but only abnormal results are displayed) Labs Reviewed  COMPREHENSIVE METABOLIC PANEL - Abnormal; Notable for the following:       Result Value   CO2 20 (*)    Glucose, Bld 123 (*)    BUN 55 (*)    Creatinine, Ser 1.65 (*)    Total Protein 8.8 (*)    Albumin 3.4 (*)    AST 43 (*)    Alkaline Phosphatase 412 (*)    GFR calc non Af Amer 42 (*)    GFR calc Af Amer 49 (*)    All other  components within normal limits  CBC WITH DIFFERENTIAL/PLATELET - Abnormal; Notable for the following:    WBC 12.9 (*)    RDW 17.0 (*)    Platelets 638 (*)    Neutro Abs 9.3 (*)    Monocytes Absolute 1.1 (*)    All other components within normal limits  I-STAT CHEM 8, ED - Abnormal; Notable for the following:    Sodium 134 (*)    BUN 52 (*)    Creatinine, Ser 1.80 (*)    Glucose, Bld 120 (*)    Calcium, Ion 1.11 (*)    All other components within normal limits  URINALYSIS, ROUTINE W REFLEX MICROSCOPIC (NOT AT [facility])  CBC  CREATININE, SERUM  COMPREHENSIVE METABOLIC PANEL  MAGNESIUM  PHOSPHORUS  I-STAT TROPOININ, ED  I-STAT CG4 LACTIC ACID, ED    EKG  EKG Interpretation  Date/Time:  Friday December 12 2015 15:40:20 EDT Ventricular Rate:  115 PR Interval:    QRS Duration: 83 QT Interval:  323 QTC Calculation: 447 R Axis:   -57 Text Interpretation:  Sinus tachycardia Left atrial enlargement RSR' in V1 or V2, probably normal variant Inferior infarct, acute (RCA) Probable RV involvement, suggest recording right precordial leads no STEMI. peake dT waves. Ischemic vs rate related, similar ro 06/20/2009 Confirmed by Johnney Killian, MD, Jeannie Done [phone]) on 12/12/2015 3:47:05 PM       Radiology Dg Chest Portable 1 View  Result Date: 12/12/2015 CLINICAL DATA:  Weakness and tachycardia. EXAM: PORTABLE CHEST 1 VIEW COMPARISON:  11/22/2015 CT.  Prior radiographs. FINDINGS: The cardiomediastinal silhouette is unremarkable. Left basilar opacity has decreased and probably represents atelectasis. There is no evidence of focal airspace disease, pulmonary edema, suspicious pulmonary nodule/mass, pleural effusion, or pneumothorax. No acute bony abnormalities are identified. IMPRESSION: Decreased left basilar opacity -probably atelectasis. No other significant abnormalities noted. Electronically Signed   By: Margarette Canada M.D.   On: 12/12/2015 16:02    Procedures Procedures (including critical care  time)  Medications Ordered in ED Medications  sodium chloride 0.9 % bolus 1,000 mL (1,000 mLs Intravenous New Bag/Given 12/12/15 1557)     Initial Impression / Assessment and Plan /  ED Course  I have reviewed the triage vital signs and the nursing notes.  Pertinent labs & imaging results that were available during my care of the patient were reviewed by me and considered in my medical decision making (see chart for details).  Clinical Course    Pt feels mildly improved with fluids. Some AKI, leukocytosis of 12,900. EKG with peaked T-waves and tachycardia but no STEMI, troponin 0. Dr. Reece Agar of general surgery to admit.  Final Clinical Impressions(s) / ED Diagnoses   Final diagnoses:  Generalized weakness  AKI (acute kidney injury) [facility])    New Prescriptions New Prescriptions   No medications on file     Anne Ng, Hershal Coria 12/12/15 1754    Charlesetta Shanks, MD 12/16/15 1843

## 2015-12-12 NOTE — ED Triage Notes (Signed)
Pt to ER BIB [facility] from home with complaint of worsening weakness and fatigue since discharge on Friday. Pt recently admitted for GI ulcer, received multiple units of blood per patient. Has colostomy in place and denies blood in stool. Pt tachycardic on arrival at 120. BP 124/99. No fever at this time. Pt reporting epigastric and back pain.

## 2015-12-13 LAB — GLUCOSE, CAPILLARY
GLUCOSE-CAPILLARY: 110 mg/dL — AB (ref 65–99)
GLUCOSE-CAPILLARY: 110 mg/dL — AB (ref 65–99)
GLUCOSE-CAPILLARY: 113 mg/dL — AB (ref 65–99)
GLUCOSE-CAPILLARY: 121 mg/dL — AB (ref 65–99)
Glucose-Capillary: 117 mg/dL — ABNORMAL HIGH (ref 65–99)
Glucose-Capillary: 119 mg/dL — ABNORMAL HIGH (ref 65–99)

## 2015-12-13 LAB — COMPREHENSIVE METABOLIC PANEL
ALBUMIN: 2.3 g/dL — AB (ref 3.5–5.0)
ALT: 39 U/L (ref 17–63)
ANION GAP: 7 (ref 5–15)
AST: 22 U/L (ref 15–41)
Alkaline Phosphatase: 250 U/L — ABNORMAL HIGH (ref 38–126)
BILIRUBIN TOTAL: 0.5 mg/dL (ref 0.3–1.2)
BUN: 39 mg/dL — ABNORMAL HIGH (ref 6–20)
CO2: 20 mmol/L — ABNORMAL LOW (ref 22–32)
Calcium: 8.1 mg/dL — ABNORMAL LOW (ref 8.9–10.3)
Chloride: 110 mmol/L (ref 101–111)
Creatinine, Ser: 1.36 mg/dL — ABNORMAL HIGH (ref 0.61–1.24)
GFR calc Af Amer: >60 mL/min (ref >60)
GFR calc non Af Amer: 53 mL/min — ABNORMAL LOW (ref >60)
GLUCOSE: 120 mg/dL — AB (ref 65–99)
POTASSIUM: 3.7 mmol/L (ref 3.5–5.1)
Sodium: 137 mmol/L (ref 135–145)
TOTAL PROTEIN: 5.9 g/dL — AB (ref 6.5–8.1)

## 2015-12-13 LAB — PHOSPHORUS: Phosphorus: 3.3 mg/dL (ref 2.5–4.6)

## 2015-12-13 LAB — MAGNESIUM: MAGNESIUM: 1.9 mg/dL (ref 1.7–2.4)

## 2015-12-13 MED ORDER — BOOST / RESOURCE BREEZE PO LIQD
1.0000 | ORAL | Status: DC
  Administered 2015-12-13: 1 via ORAL

## 2015-12-13 MED ORDER — OSMOLITE 1.2 CAL PO LIQD
1000.0000 mL | ORAL | Status: DC
  Administered 2015-12-13 – 2015-12-20 (×9): 1000 mL
  Filled 2015-12-13 (×16): qty 1000

## 2015-12-13 MED ORDER — ADULT MULTIVITAMIN W/MINERALS CH
1.0000 | ORAL_TABLET | Freq: Every day | ORAL | Status: DC
  Administered 2015-12-14 – 2015-12-24 (×11): 1 via ORAL
  Filled 2015-12-13 (×11): qty 1

## 2015-12-13 NOTE — Progress Notes (Signed)
  Subjective: Feels much better today, having stoma output taking ice no n/v  Objective: Vital signs in last 24 hours: Temp:  [98 F (36.7 C)-98.3 F (36.8 C)] 98.3 F (36.8 C) (09/16 0435) Pulse Rate:  [87-119] 87 (09/16 0435) Resp:  [14-28] 18 (09/15 1845) BP: (108-137)/(72-99) 112/72 (09/16 0435) SpO2:  [96 %-100 %] 97 % (09/16 0435) Weight:  [57.9 kg (127 lb 10.3 oz)-59.3 kg (130 lb 11.7 oz)] 59.3 kg (130 lb 11.7 oz) (09/16 0717) Last BM Date: 12/12/15 (colostomy)  Intake/Output from previous day: 09/15 0701 - 09/16 0700 In: 2140 [I.V.:1090; IV Piggyback:1050] Out: 1140 [Urine:955; Drains:185] Intake/Output this shift: Total I/O In: 70 [P.O.:70] Out: 350 [Urine:200; Drains:150]  Resp: clear to auscultation bilaterally Cardio: regular rate and rhythm GI: soft, duodenostomy with bilious fluid, ruq drain with gray fluid, g tube in place stoma functional  Lab Results:   Recent Labs  12/12/15 1549 12/12/15 1556  WBC 12.9*  --   HGB 13.6 16.0  HCT 42.6 47.0  PLT 638*  --    BMET  Recent Labs  12/12/15 1549 12/12/15 1556 12/13/15 0454  NA 135 134* 137  K 5.1 4.8 3.7  CL 101 101 110  CO2 20*  --  20*  GLUCOSE 123* 120* 120*  BUN 55* 52* 39*  CREATININE 1.65* 1.80* 1.36*  CALCIUM 9.9  --  8.1*   PT/INR No results for input(s): LABPROT, INR in the last 72 hours. ABG No results for input(s): PHART, HCO3 in the last 72 hours.  Invalid input(s): PCO2, PO2  Studies/Results: Dg Chest Portable 1 View  Result Date: 12/12/2015 CLINICAL DATA:  Weakness and tachycardia. EXAM: PORTABLE CHEST 1 VIEW COMPARISON:  11/22/2015 CT.  Prior radiographs. FINDINGS: The cardiomediastinal silhouette is unremarkable. Left basilar opacity has decreased and probably represents atelectasis. There is no evidence of focal airspace disease, pulmonary edema, suspicious pulmonary nodule/mass, pleural effusion, or pneumothorax. No acute bony abnormalities are identified. IMPRESSION:  Decreased left basilar opacity -probably atelectasis. No other significant abnormalities noted. Electronically Signed   By: Margarette Canada M.D.   On: 12/12/2015 16:02    Anti-infectives: Anti-infectives    Start     Dose/Rate Route Frequency Ordered Stop   12/13/15 0000  piperacillin-tazobactam (ZOSYN) IVPB 3.375 g     3.375 g 12.5 mL/hr over 240 Minutes Intravenous Every 8 hours 12/12/15 1804     12/12/15 1800  piperacillin-tazobactam (ZOSYN) IVPB 3.375 g     3.375 g 100 mL/hr over 30 Minutes Intravenous  Once 12/12/15 1745 12/12/15 1830      Assessment/Plan: Dehydration, AKI  1. Continue pain meds  2. Continue tube feeds/clear liquids 3. Clamp duodenostomy today, follow jp output 4. pulm toilet 5. Check cbc/bmet in am, on empiric abx per Dr Kieth Brightly- can decide if ct indicated pending clinical course 6. Sq heparin, scds   Janaysha Depaulo 12/13/2015

## 2015-12-13 NOTE — Progress Notes (Signed)
Initial Nutrition Assessment  DOCUMENTATION CODES:   Severe malnutrition in context of chronic illness  INTERVENTION:  Monitor magnesium, potassium, and phosphorus daily for at least 3 days, MD to replete as needed, as pt is at risk for refeeding syndrome given estimated energy intake <20% of estimated needs for > 7 days and severe malnutrition. Recommend 200 mg thiamine daily for 5 days due to refeeding risk/severe malnutrition.   Initiate Osmolite 1.2 @ 20 ml/hr via PEG and increase by 10 ml every 12 hours to goal rate of 65 ml/hr.   Tube feeding regimen provides 1872 kcal (100% of needs), 86 grams of protein, and 1279 ml of H2O.  Provide Multivitamin with minerals daily  Provide Boost Breeze PO once daily  If patient is discharged recommend pt continue tube feedings at home. Osmolite 1.2 @ 100 ml/hr for 12 hours every night 2000 hr to 0800 hr to meet 80% of energy/protein needs.   NUTRITION DIAGNOSIS:   Malnutrition related to chronic illness, altered GI function as evidenced by severe depletion of muscle mass, severe depletion of body fat, percent weight loss, energy intake < 75% for > or equal to 1 month.   GOAL:   Patient will meet greater than or equal to 90% of their needs   MONITOR:   TF tolerance, PO intake, Labs, Weight trends, I & O's  REASON FOR ASSESSMENT:   Consult, Malnutrition Screening Tool Enteral/tube feeding initiation and management  ASSESSMENT:   65 year old male underwent open reduction of hiatal hernia with small bowel resection and repair of hiatal hernia for acutely incarcerated hernia leading to perforation of small intestine. Postoperatively he did fairly well as able tolerate food and eventually was discharged without G-tube feeds. Since then he has had increasing amounts of nausea not controlled with typical oral medications. He's had significant weight loss since discharge from hospital.   Pt reports that since previous discharge he has been  eating very little due to nausea, abdominal pain, and very poor appetite. He reports drinking one bottle of Ensure and a few spoonfuls of ice cream daily. He reports that during previous hospitalization he was only able to tolerate small amounts of food. He states that prior to surgery he weighed 170 lbs and states that he weighed 119 lbs yesterday. Pt has severe muscle and severe fat wasting per nutrition-focused physical exam. He reports tolerating some jello, apple juice and gingerale this morning. Vital High protein infusing @ 20 ml/hr at time of visit. Pt denies any nausea or abdominal pain.   Labs: low calcium, low albumin, high alk phos  Diet Order:  Diet clear liquid Room service appropriate? Yes  Skin:  Wound (see comment) (closed abdominal incision with drain)  Last BM:  9/16  Height:   Ht Readings from Last 1 Encounters:  12/12/15 '5\' 5"'  (1.651 m)    Weight:   Wt Readings from Last 1 Encounters:  12/13/15 130 lb 11.7 oz (59.3 kg)    Ideal Body Weight:  64.5 kg  BMI:  Body mass index is 21.76 kg/m.  Estimated Nutritional Needs:   Kcal:  1800-2000  Protein:  85-95 grams  Fluid:  2 L/day  EDUCATION NEEDS:   No education needs identified at this time  Scarlette Ar RD, CSP, LDN Inpatient Clinical Dietitian Pager: [phone] After Hours Pager: [phone]

## 2015-12-14 LAB — CBC
HCT: 30.9 % — ABNORMAL LOW (ref 39.0–52.0)
Hemoglobin: 9.4 g/dL — ABNORMAL LOW (ref 13.0–17.0)
MCH: 26.9 pg (ref 26.0–34.0)
MCHC: 30.4 g/dL (ref 30.0–36.0)
MCV: 88.3 fL (ref 78.0–100.0)
PLATELETS: 363 10*3/uL (ref 150–400)
RBC: 3.5 MIL/uL — AB (ref 4.22–5.81)
RDW: 17.1 % — AB (ref 11.5–15.5)
WBC: 9.6 10*3/uL (ref 4.0–10.5)

## 2015-12-14 LAB — BASIC METABOLIC PANEL
Anion gap: 6 (ref 5–15)
BUN: 24 mg/dL — AB (ref 6–20)
CALCIUM: 7.6 mg/dL — AB (ref 8.9–10.3)
CO2: 23 mmol/L (ref 22–32)
Chloride: 110 mmol/L (ref 101–111)
Creatinine, Ser: 1.1 mg/dL (ref 0.61–1.24)
GFR calc Af Amer: >60 mL/min (ref >60)
Glucose, Bld: 108 mg/dL — ABNORMAL HIGH (ref 65–99)
POTASSIUM: 3.7 mmol/L (ref 3.5–5.1)
SODIUM: 139 mmol/L (ref 135–145)

## 2015-12-14 LAB — GLUCOSE, CAPILLARY
GLUCOSE-CAPILLARY: 110 mg/dL — AB (ref 65–99)
GLUCOSE-CAPILLARY: 120 mg/dL — AB (ref 65–99)
Glucose-Capillary: 108 mg/dL — ABNORMAL HIGH (ref 65–99)
Glucose-Capillary: 122 mg/dL — ABNORMAL HIGH (ref 65–99)

## 2015-12-14 NOTE — Progress Notes (Signed)
Pt feeling full so will leave TF at 40 for 12 additional hours until 0400.

## 2015-12-14 NOTE — Progress Notes (Signed)
  Subjective: Feels much better today, having stoma output taking clears.  Tolerating tube feeds  Objective: Vital signs in last 24 hours: Temp:  [98 F (36.7 C)-98.4 F (36.9 C)] 98.2 F (36.8 C) (09/17 0627) Pulse Rate:  [87-88] 88 (09/17 0627) Resp:  [18-19] 18 (09/17 0627) BP: (106-116)/(68-71) 116/71 (09/17 0627) SpO2:  [96 %-100 %] 96 % (09/17 0627) Weight:  [60.1 kg (132 lb 7.9 oz)] 60.1 kg (132 lb 7.9 oz) (09/17 0627) Last BM Date: 12/13/15  Intake/Output from previous day: 09/16 0701 - 09/17 0700 In: 2589.2 [P.O.:190; I.V.:1945; NG/GT:304.2; IV Piggyback:150] Out: 1855 [Urine:1350; Drains:405; Stool:100] Intake/Output this shift: Total I/O In: -  Out: 20 [Drains:20]  Resp: clear to auscultation bilaterally Cardio: regular rate and rhythm GI: soft, duodenostomy with bilious fluid, ruq drain with lightly tinged bilious fluid, g tube in place stoma functional  Lab Results:   Recent Labs  12/12/15 1549 12/12/15 1556 12/14/15 0214  WBC 12.9*  --  9.6  HGB 13.6 16.0 9.4*  HCT 42.6 47.0 30.9*  PLT 638*  --  363   BMET  Recent Labs  12/13/15 0454 12/14/15 0214  NA 137 139  K 3.7 3.7  CL 110 110  CO2 20* 23  GLUCOSE 120* 108*  BUN 39* 24*  CREATININE 1.36* 1.10  CALCIUM 8.1* 7.6*   PT/INR No results for input(s): LABPROT, INR in the last 72 hours. ABG No results for input(s): PHART, HCO3 in the last 72 hours.  Invalid input(s): PCO2, PO2  Studies/Results: Dg Chest Portable 1 View  Result Date: 12/12/2015 CLINICAL DATA:  Weakness and tachycardia. EXAM: PORTABLE CHEST 1 VIEW COMPARISON:  11/22/2015 CT.  Prior radiographs. FINDINGS: The cardiomediastinal silhouette is unremarkable. Left basilar opacity has decreased and probably represents atelectasis. There is no evidence of focal airspace disease, pulmonary edema, suspicious pulmonary nodule/mass, pleural effusion, or pneumothorax. No acute bony abnormalities are identified. IMPRESSION: Decreased  left basilar opacity -probably atelectasis. No other significant abnormalities noted. Electronically Signed   By: Margarette Canada M.D.   On: 12/12/2015 16:02    Anti-infectives: Anti-infectives    Start     Dose/Rate Route Frequency Ordered Stop   12/13/15 0000  piperacillin-tazobactam (ZOSYN) IVPB 3.375 g     3.375 g 12.5 mL/hr over 240 Minutes Intravenous Every 8 hours 12/12/15 1804     12/12/15 1800  piperacillin-tazobactam (ZOSYN) IVPB 3.375 g     3.375 g 100 mL/hr over 30 Minutes Intravenous  Once 12/12/15 1745 12/12/15 1830      Assessment/Plan: Dehydration, AKI  1. Continue pain meds  2. Continue tube feeds/clear liquids 3. Cont to clamp duodenostomy today, follow jp output 4. pulm toilet 5. on empiric abx per Dr Kieth Brightly- will discuss if ct indicated pending clinical course 6. Sq heparin, scds   Zimal Weisensel C. [id number]

## 2015-12-15 ENCOUNTER — Encounter (HOSPITAL_COMMUNITY): Payer: Self-pay | Admitting: General Practice

## 2015-12-15 ENCOUNTER — Inpatient Hospital Stay (HOSPITAL_COMMUNITY): Payer: 59

## 2015-12-15 LAB — BASIC METABOLIC PANEL
ANION GAP: 5 (ref 5–15)
BUN: 10 mg/dL (ref 6–20)
CO2: 25 mmol/L (ref 22–32)
Calcium: 7.7 mg/dL — ABNORMAL LOW (ref 8.9–10.3)
Chloride: 107 mmol/L (ref 101–111)
Creatinine, Ser: 0.84 mg/dL (ref 0.61–1.24)
GFR calc Af Amer: >60 mL/min (ref >60)
GLUCOSE: 123 mg/dL — AB (ref 65–99)
POTASSIUM: 4.1 mmol/L (ref 3.5–5.1)
Sodium: 137 mmol/L (ref 135–145)

## 2015-12-15 LAB — GLUCOSE, CAPILLARY
GLUCOSE-CAPILLARY: 112 mg/dL — AB (ref 65–99)
GLUCOSE-CAPILLARY: 115 mg/dL — AB (ref 65–99)
Glucose-Capillary: 117 mg/dL — ABNORMAL HIGH (ref 65–99)
Glucose-Capillary: 121 mg/dL — ABNORMAL HIGH (ref 65–99)

## 2015-12-15 LAB — CBC
HCT: 31.7 % — ABNORMAL LOW (ref 39.0–52.0)
Hemoglobin: 9.7 g/dL — ABNORMAL LOW (ref 13.0–17.0)
MCH: 26.9 pg (ref 26.0–34.0)
MCHC: 30.6 g/dL (ref 30.0–36.0)
MCV: 88.1 fL (ref 78.0–100.0)
PLATELETS: 351 10*3/uL (ref 150–400)
RBC: 3.6 MIL/uL — ABNORMAL LOW (ref 4.22–5.81)
RDW: 16.9 % — ABNORMAL HIGH (ref 11.5–15.5)
WBC: 8.9 10*3/uL (ref 4.0–10.5)

## 2015-12-15 MED ORDER — IOPAMIDOL (ISOVUE-300) INJECTION 61%
INTRAVENOUS | Status: AC
  Administered 2015-12-15: 100 mL
  Filled 2015-12-15: qty 100

## 2015-12-15 MED ORDER — IOPAMIDOL (ISOVUE-300) INJECTION 61%
INTRAVENOUS | Status: AC
  Administered 2015-12-15: 30 mL
  Filled 2015-12-15: qty 30

## 2015-12-15 MED ORDER — HYDROMORPHONE HCL 1 MG/ML IJ SOLN
1.0000 mg | INTRAMUSCULAR | Status: DC | PRN
  Administered 2015-12-15 – 2015-12-24 (×31): 1 mg via INTRAVENOUS
  Filled 2015-12-15 (×32): qty 1

## 2015-12-15 MED ORDER — OXYCODONE HCL 5 MG PO TABS
5.0000 mg | ORAL_TABLET | ORAL | Status: DC | PRN
  Administered 2015-12-15 – 2015-12-24 (×23): 10 mg via ORAL
  Filled 2015-12-15 (×23): qty 2

## 2015-12-15 NOTE — Progress Notes (Signed)
Patient ID: Miguel [name], male   DOB: [birth year], 64 y.o.   MRN: [id number]  [facility] Surgery Progress Note     Subjective: Feeling about the same as yesterday. Tolerating clears. Passing stool and flatus into colostomy bag.  Objective: Vital signs in last 24 hours: Temp:  [97.5 F (36.4 C)-98.5 F (36.9 C)] 97.5 F (36.4 C) (09/18 0413) Pulse Rate:  [82-91] 91 (09/18 0413) Resp:  [18-19] 18 (09/18 0413) BP: (108-121)/(74-78) 121/78 (09/18 0413) SpO2:  [97 %-98 %] 97 % (09/18 0413) Weight:  [132 lb 11.5 oz (60.2 kg)] 132 lb 11.5 oz (60.2 kg) (09/18 0413) Last BM Date: 12/14/15  Intake/Output from previous day: 09/17 0701 - 09/18 0700 In: 3662.3 [P.O.:400; I.V.:2421.7; NG/GT:690.7; IV Piggyback:150] Out: 2080 [Urine:1800; Drains:80; Stool:200] Intake/Output this shift: No intake/output data recorded.  PE: Gen:  Alert, NAD, pleasant Resp: clear to auscultation bilaterally, effort nonlabored Cardio: regular rate and rhythm GI: soft, +BS, midline incision healing well with no drainage or erythema, duodenostomy clamped, RUQ drain with minimal lightly tinged bilious fluid, g-tube in place, stool in colostomy bag  RLQ JP drain 80cc/24hr  Lab Results:   Recent Labs  12/12/15 1549 12/12/15 1556 12/14/15 0214  WBC 12.9*  --  9.6  HGB 13.6 16.0 9.4*  HCT 42.6 47.0 30.9*  PLT 638*  --  363   BMET  Recent Labs  12/13/15 0454 12/14/15 0214  NA 137 139  K 3.7 3.7  CL 110 110  CO2 20* 23  GLUCOSE 120* 108*  BUN 39* 24*  CREATININE 1.36* 1.10  CALCIUM 8.1* 7.6*   PT/INR No results for input(s): LABPROT, INR in the last 72 hours. CMP     Component Value Date/Time   NA 139 12/14/2015 0214   K 3.7 12/14/2015 0214   CL 110 12/14/2015 0214   CO2 23 12/14/2015 0214   GLUCOSE 108 (H) 12/14/2015 0214   BUN 24 (H) 12/14/2015 0214   CREATININE 1.10 12/14/2015 0214   CALCIUM 7.6 (L) 12/14/2015 0214   PROT 5.9 (L) 12/13/2015 0454   ALBUMIN 2.3 (L) 12/13/2015  0454   AST 22 12/13/2015 0454   ALT 39 12/13/2015 0454   ALKPHOS 250 (H) 12/13/2015 0454   BILITOT 0.5 12/13/2015 0454   GFRNONAA >60 12/14/2015 0214   GFRAA >60 12/14/2015 0214   Lipase     Component Value Date/Time   LIPASE 44 11/13/2015 2300       Studies/Results: No results found.  Anti-infectives: Anti-infectives    Start     Dose/Rate Route Frequency Ordered Stop   12/13/15 0000  piperacillin-tazobactam (ZOSYN) IVPB 3.375 g     3.375 g 12.5 mL/hr over 240 Minutes Intravenous Every 8 hours 12/12/15 1804     12/12/15 1800  piperacillin-tazobactam (ZOSYN) IVPB 3.375 g     3.375 g 100 mL/hr over 30 Minutes Intravenous  Once 12/12/15 1745 12/12/15 1830       Assessment/Plan -Sigmoid diverticulitis with perforation status post sigmoid colectomy and colostomy 07/05/15 Dr. [name] -Repair of perforated ulcer, resection of distal stomach - 10/30/15 Dr. Hulen Skains -S/p open reduction of hiatal hernia, repair of hiatal hernia, resection of small bowel with, anastomosis placement of gastrostomy tube, placement of duodenostomy tube 11/21/15 Dr. Kieth Brightly. Hospitalized 8/17-9/08/17 -Readmit 12/12/15 with dehydration, elevated creatinine and PCM >>Cr normalized 1.10 yesterday >>WBC normalized 9.6 yesterday, afebrile >>RLQ JP drain 80cc/24hr  FEN:  IVF, TF, clears ID: day 4 Zosyn DVT: Heparin, SCD's  Plan: repeat labs today  to check WBC and Cr. Will discuss need for CT scan with MD.   LOS: 3 days    Jerrye Beavers , [facility] Surgery 12/15/2015, 7:26 AM Pager: [phone] Consults: [phone] Mon-Fri 7:00 am-4:30 pm Sat-Sun 7:00 am-11:30 am  Agree with above. Wife in room.  Complex surgical history - Has LUQ G tube (for feedings), RUQ duodenostomy tube, RLQ drain - put out 80 cc of clear greenish fluid last 24 hours, and LLQ ostomy. Agree with plan for CT scan abdomen/pelvis to evaluate drains and abdominal anatomy.  Alphonsa Overall, MD, [facility] Surgery Pager: [phone] Office phone:  [phone]

## 2015-12-15 NOTE — Consult Note (Signed)
   [facility] CM Inpatient Consult   12/15/2015  TORIAN RODRIGUE Mar 15, 1951 [id number]   Came to bedside to speak with Mr. Vaid. He has been followed telephonically post hospital discharge as a benefit of being a [place] member. His wife is a Adult nurse. Discussed readmission. Mr. Bogdanowicz states he is feeling better and is awaiting a CT scan. He does not voice any concerns. States he remembers speaking with [facility] [place] last week. Made him aware that writer will continue to follow and that he will receive post hospital discharge calls. Appreciative of visit. Left contact information at bedside. Will make inpatient RNCM aware of the above.   [name], MSN-Ed, RN,BSN [facility] Liaison [phone]

## 2015-12-15 NOTE — Care Management Note (Signed)
Case Management Note  Patient Details  Name: DRYDEN JARROW MRN: [id number] Date of Birth: 08/31/1950  Subjective/Objective:                    Action/Plan:  Was active with [place] prior to admission , will need resumption of care and tube feeding orders .   Thanks Magdalen Spatz RN [phone]  Expected Discharge Date:                  Expected Discharge Plan:  [place]  In-House Referral:     Discharge planning Services     Post Acute Care Choice:    Choice offered to:     DME Arranged:    DME Agency:     HH Arranged:    HH Agency:     Status of Service:  In process, will continue to follow  If discussed at Long Length of Stay Meetings, dates discussed:    Additional Comments:  Marilu Favre, RN 12/15/2015, 10:52 AM

## 2015-12-15 NOTE — Progress Notes (Signed)
Pharmacy Antibiotic Note  Miguel [name] is a 65 y.o. male admitted on 12/12/2015 with intra-abdominal infection.  Pharmacy has been consulted for Zosyn dosing.  Patient's renal function is improving.  Plan: Continue Zosyn 3.375g IV Q8H, 4 hr infusion Pharmacy will sign off as dosage adjustment is likely unnecessary.  Thank you for the consult!   Height: 5\' 5"  (165.1 cm) Weight: 132 lb 11.5 oz (60.2 kg) IBW/kg (Calculated) : 61.5  Temp (24hrs), Avg:98.1 F (36.7 C), Min:97.5 F (36.4 C), Max:98.5 F (36.9 C)   Recent Labs Lab 12/12/15 1549 12/12/15 1556 12/13/15 0454 12/14/15 0214 12/15/15 0835  WBC 12.9*  --   --  9.6 8.9  CREATININE 1.65* 1.80* 1.36* 1.10  --   LATICACIDVEN  --  1.77  --   --   --     Estimated Creatinine Clearance: 57 mL/min (by C-G formula based on SCr of 1.1 mg/dL).    No Active Allergies  Antimicrobials this admission: Zosyn 9/16 >>   Dose adjustments this admission: N/A  Microbiology results: N/A   Viraaj Vorndran D. Mina Marble, PharmD, BCPS Pager:  [phone] 12/15/2015, 9:26 AM

## 2015-12-16 LAB — GLUCOSE, CAPILLARY
GLUCOSE-CAPILLARY: 123 mg/dL — AB (ref 65–99)
GLUCOSE-CAPILLARY: 99 mg/dL (ref 65–99)

## 2015-12-16 MED ORDER — METOCLOPRAMIDE HCL 5 MG/5ML PO SOLN
5.0000 mg | Freq: Three times a day (TID) | ORAL | Status: DC
  Administered 2015-12-16 – 2015-12-24 (×26): 5 mg
  Filled 2015-12-16 (×24): qty 10

## 2015-12-16 NOTE — Progress Notes (Signed)
PT Cancellation Note  Patient Details Name: TANO MARCUM MRN: [id number] DOB: [birth year]   Cancelled Treatment:    Reason Eval/Treat Not Completed: PT screened, no needs identified, will sign off. Pt and daughter report that he walked around entire unit with walker and daughter pushing IV pole. Pt and daughter both feel comfortable with pt being able to mobilize without PT.   Jaheem Hedgepath 12/16/2015, 3:19 PM [facility] PT [phone]

## 2015-12-16 NOTE — Progress Notes (Signed)
Nutrition Follow-up  DOCUMENTATION CODES:   Severe malnutrition in context of chronic illness  INTERVENTION:   -D/c Boost Breeze due to poor acceptance  -Continue Osmolite 1.2 @ 50 ml/hr via PEG and increase by 10 ml every 12 hours to goal rate of 65 ml/hr.   Tube feeding regimen provides 1872 kcal (100% of needs), 86 grams of protein, and 1279 ml of H2O.   -Continue Multivitamin with minerals daily  -If patient is discharged recommend pt continue tube feedings at home. Osmolite 1.2 @ 100 ml/hr for 12 hours every night 2000 hr to 0800 hr to meet 80% of energy/protein needs.   NUTRITION DIAGNOSIS:   Malnutrition related to chronic illness, altered GI function as evidenced by severe depletion of muscle mass, severe depletion of body fat, percent weight loss, energy intake < 75% for > or equal to 1 month.  Ongoing  GOAL:   Patient will meet greater than or equal to 90% of their needs  Progressing  MONITOR:   TF tolerance, PO intake, Labs, Weight trends, I & O's  REASON FOR ASSESSMENT:   Consult, Malnutrition Screening Tool Enteral/tube feeding initiation and management  ASSESSMENT:   65 year old male underwent open reduction of hiatal hernia with small bowel resection and repair of hiatal hernia for acutely incarcerated hernia leading to perforation of small intestine. Postoperatively he did fairly well as able tolerate food and eventually was discharged without G-tube feeds. Since then he has had increasing amounts of nausea not controlled with typical oral medications. He's had significant weight loss since discharge from hospital.   Spoke with pt and family member at bedside. Pt reports he is feeling a lot better. He is consuming some of his clear liquids. He does not like the [employer] supplement. RD will d/c.   Case discussed with RN. She confirms that TF were decreased last evening due to pt feeling bloated. Osmolite 1.2 is currently infusing @ 50 ml/hr, which  provides 1440 kcals, 67 grams protein, and 984 ml fluid daily (meeting 80% of estimated kcal needs and 84% of estimated protein needs). Per MD notes, reglan was added today. RN confirms plan to slowly increase TF back to goal rate of 65 ml/hr (which provides 1872 kcal (100% of needs), 86 grams of protein, and 1279 ml of H2O.   Pt and family member asked this RD about plan for tube feedings, particularly for home. Pt expressed concern about being connected to feeding pump for extended periods of time. Discussed potential for bolus or nocturnal feedings, which pt and daughter were amenable to. Noted recommendations for nocturnal feedings in previous RD assessment on 12/13/15.   Labs reviewed: CBGS: 112-123.   Diet Order:  Diet clear liquid Room service appropriate? Yes  Skin:  Reviewed, no issues  Last BM:  12/16/15  Height:   Ht Readings from Last 1 Encounters:  12/12/15 5\' 5"  (1.651 m)    Weight:   Wt Readings from Last 1 Encounters:  12/15/15 132 lb 11.5 oz (60.2 kg)    Ideal Body Weight:  64.5 kg  BMI:  Body mass index is 22.09 kg/m.  Estimated Nutritional Needs:   Kcal:  1800-2000  Protein:  85-95 grams  Fluid:  2 L/day  EDUCATION NEEDS:   No education needs identified at this time  Miro Balderson A. Jimmye Norman, RD, LDN, CDE Pager: [phone] After hours Pager: [phone]

## 2015-12-16 NOTE — Progress Notes (Signed)
Subjective: Pleasant and cooperative.  In no distress. Had a little reflux and heartburn last night and tube feeds back to off from 60-50 mL per hour. His biggest complaint is that he can't eat any solid food. Otherwise stable  Creatinine 0.84.  BUN 10.  Glucose 123.  WBC 8900.  Hemoglobin 9.7. CT scan yesterday looked pretty good.  No abscess.  Gastrostomy and duodenostomy tubes in grossly good position.  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  No obstruction.  Tiny pleural effusions.  Objective: Vital signs in last 24 hours: Temp:  [98.1 F (36.7 C)-98.9 F (37.2 C)] 98.9 F (37.2 C) (09/19 0550) Pulse Rate:  [87-94] 94 (09/19 0550) Resp:  [18-19] 19 (09/19 0550) BP: (110-128)/(64-84) 128/84 (09/19 0550) SpO2:  [99 %] 99 % (09/19 0550) Last BM Date: 12/15/15  Intake/Output from previous day: 09/18 0701 - 09/19 0700 In: 2829.2 [P.O.:240; I.V.:1200; NG/GT:1289.2; IV Piggyback:100] Out: [id number] [Urine:1050; Drains:70; Stool:225] Intake/Output this shift: Total I/O In: 660 [NG/GT:660] Out: 430 [Urine:400; Drains:30]  General appearance: Alert.  No distress.  Vital status normal. Resp: clear to auscultation bilaterally GI: Abdomen soft.  Nontender.  Nondistended.  Ostomy with good stool output.  Midline wound clean and packed open.  Tube feedings at 50 mL per hour per gastric tube.  Duodenostomy tube clamped.  Right upper quadrant JP drain with green cloudy fluid.  Lab Results:   Recent Labs  12/14/15 0214 12/15/15 0835  WBC 9.6 8.9  HGB 9.4* 9.7*  HCT 30.9* 31.7*  PLT 363 351   BMET  Recent Labs  12/14/15 0214 12/15/15 0835  NA 139 137  K 3.7 4.1  CL 110 107  CO2 23 25  GLUCOSE 108* 123*  BUN 24* 10  CREATININE 1.10 0.84  CALCIUM 7.6* 7.7*   PT/INR No results for input(s): LABPROT, INR in the last 72 hours. ABG No results for input(s): PHART, HCO3 in the last 72 hours.  Invalid input(s): PCO2, PO2  Studies/Results: Ct Abdomen Pelvis  W Contrast  Result Date: 12/15/2015 CLINICAL DATA:  Follow-up intra abdominal infection status post perforated diverticulitis. EXAM: CT ABDOMEN AND PELVIS WITH CONTRAST TECHNIQUE: Multidetector CT imaging of the abdomen and pelvis was performed using the standard protocol following bolus administration of intravenous contrast. CONTRAST:  159mL ISOVUE-300 IOPAMIDOL (ISOVUE-300) INJECTION 61% COMPARISON:  CT scan of November 04, 2015. FINDINGS: Lower chest: Minimal right posterior basilar subsegmental atelectasis is noted. Mild left pleural effusion is noted with adjacent left lower lobe atelectasis. Hepatobiliary: No gallstones are noted.  The liver appears normal. Pancreas: Normal. Spleen: Normal. Adrenals/Urinary Tract: Adrenal glands appear normal. Bilateral nonobstructive nephrolithiasis is noted. Stable right renal cyst. No hydronephrosis or renal obstruction is noted. Urinary bladder appears normal. Stomach/Bowel: Status post surgical repair of hiatal hernia. Gastrostomy tube is in grossly good position. Duodenal feeding tube is also in good position with distal tip in second portion of duodenum. The appendix appears normal. There is no evidence of bowel obstruction. Stable ostomy is noted in left lower quadrant with small peristomal hernia. Vascular/Lymphatic: Atherosclerosis of abdominal aorta is noted without aneurysm formation. No significant adenopathy is noted. Reproductive: Stable mild prostatic enlargement is noted. Other: No abnormal fluid collection is noted. Surgical drain is seen entering right lower quadrant with distal tip near the gastric cardia. Musculoskeletal: Mild degenerative disc disease is noted at L5-S1. IMPRESSION: Mild left pleural effusion with adjacent left lower lobe atelectasis. Stable mild prostatic enlargement. Aortic atherosclerosis. Bilateral nonobstructive nephrolithiasis. Status post hiatal hernia  repair. Gastrostomy and duodenal feeding tube in grossly good position. 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 Surgical drain is seen entering right lower quadrant with distal tip near gastric cardia. Electronically Signed   By: Marijo Conception, M.D.   On: 12/15/2015 15:25    Anti-infectives: Anti-infectives    Start     Dose/Rate Route Frequency Ordered Stop   12/13/15 0000  piperacillin-tazobactam (ZOSYN) IVPB 3.375 g     3.375 g 12.5 mL/hr over 240 Minutes Intravenous Every 8 hours 12/12/15 1804     12/12/15 1800  piperacillin-tazobactam (ZOSYN) IVPB 3.375 g     3.375 g 100 mL/hr over 30 Minutes Intravenous  Once 12/12/15 1745 12/12/15 1830      Assessment/Plan:  -Sigmoid diverticulitis with perforation status post sigmoid colectomy and colostomy 07/05/15 Dr. [name]  -Repair of perforated ulcer, resection of distal stomach  With BII reconstr.- 10/30/15 Dr. Hulen Skains  -S/p open reduction of hiatal hernia, repair of hiatal hernia, resection of small bowel with, anastomosis placement of gastrostomy tube, placement of duodenostomy tube 11/21/15 Dr. Kieth Brightly. Hospitalized 8/17-9/08/17  -Readmit 12/12/15 with dehydration, elevated creatinine and PCM >>Cr normalized  >>WBC normalized >> CT scan yesterday negative for abscess or obstruction. >>RLQ JP drain 70cc/24hr >> Consider a formal upper GI soon to assess emptying  FEN: IVF, TF, clears  Started reglan; try to titrate tube feeds back to 65 mL per hour, goal rate. ID: day 4 Zosyn DVT: Heparin, SCD's PT consult      LOS: 4 days    Georgana Romain M 12/16/2015

## 2015-12-16 NOTE — Progress Notes (Signed)
Residual checked from TF, 50cc;  Residual returned to pt and increased tube feeding to 60cc/hr.  Will continue to monitor.

## 2015-12-16 NOTE — Progress Notes (Addendum)
Patient complaint of feeling full and nauseous around 0230. No vomiting.  TF rate decreased to 50cc hour per patient request.  This am patient reports feeling better.

## 2015-12-17 ENCOUNTER — Inpatient Hospital Stay (HOSPITAL_COMMUNITY): Payer: 59

## 2015-12-17 ENCOUNTER — Inpatient Hospital Stay (HOSPITAL_COMMUNITY): Admission: RE | Admit: 2015-12-17 | Payer: 59 | Source: Ambulatory Visit | Admitting: Surgery

## 2015-12-17 LAB — GLUCOSE, CAPILLARY
GLUCOSE-CAPILLARY: 115 mg/dL — AB (ref 65–99)
GLUCOSE-CAPILLARY: 130 mg/dL — AB (ref 65–99)
GLUCOSE-CAPILLARY: 138 mg/dL — AB (ref 65–99)
Glucose-Capillary: 119 mg/dL — ABNORMAL HIGH (ref 65–99)

## 2015-12-17 SURGERY — CLOSURE, COLOSTOMY
Anesthesia: General

## 2015-12-17 MED ORDER — IOPAMIDOL (ISOVUE-300) INJECTION 61%
150.0000 mL | Freq: Once | INTRAVENOUS | Status: AC | PRN
  Administered 2015-12-17: 100 mL via ORAL

## 2015-12-17 MED ORDER — IOPAMIDOL (ISOVUE-300) INJECTION 61%
INTRAVENOUS | Status: AC
  Administered 2015-12-17: 100 mL via ORAL
  Filled 2015-12-17: qty 150

## 2015-12-17 MED ORDER — BOOST / RESOURCE BREEZE PO LIQD
1.0000 | Freq: Three times a day (TID) | ORAL | Status: DC
  Administered 2015-12-17 – 2015-12-18 (×3): 1 via ORAL

## 2015-12-17 NOTE — Progress Notes (Signed)
Noted JP drain to RLQ not charging and tubing got longer with suture detached from skin but not totally pulled out, attempted to reinsert but unsuccessful, Dr. Rosendo Gros on call notified with new order.

## 2015-12-17 NOTE — Progress Notes (Signed)
Residual checked from TF being stopped and returning from upper GI test.  Residual 75 cc.  Tube feeding restarted at 65cc/hr.  Will continue to monitor.

## 2015-12-17 NOTE — Progress Notes (Signed)
[facility] Progress Note     Subjective: Tube feeds had to be backed off to 50 mL/hr again overnight.  Objective: Vital signs in last 24 hours: Temp:  [98.1 F (36.7 C)-98.7 F (37.1 C)] 98.3 F (36.8 C) (09/20 0617) Pulse Rate:  [95-97] 97 (09/20 0617) Resp:  [18-19] 19 (09/20 0617) BP: (105-127)/(66-69) 108/66 (09/20 0617) SpO2:  [95 %-96 %] 96 % (09/20 0617) Weight:  [61.6 kg (135 lb 12.9 oz)] 61.6 kg (135 lb 12.9 oz) (09/20 0500) Last BM Date: 12/16/15  Intake/Output from previous day: 09/19 0701 - 09/20 0700 In: 3245.8 [P.O.:360; I.V.:2150; NG/GT:635.8; IV Piggyback:100] Out: 1980 [Urine:1375; Drains:30; Stool:575] Intake/Output this shift: Total I/O In: 42 [IV Piggyback:50] Out: 300 [Urine:300]  PE: Gen:  Alert, NAD, pleasant Card:  RRR, no M/G/R heard Pulm:  CTA, no W/R/R Abd: Soft, NT/ND, +BS, ostomy viable with gas/stool in bad. RLQ drain has come completely out of the patients side. Duodenostomy tube clamped and secured to patient.  Lab Results:   Recent Labs  12/15/15 0835  WBC 8.9  HGB 9.7*  HCT 31.7*  PLT 351   BMET  Recent Labs  12/15/15 0835  NA 137  K 4.1  CL 107  CO2 25  GLUCOSE 123*  BUN 10  CREATININE 0.84  CALCIUM 7.7*   PT/INR No results for input(s): LABPROT, INR in the last 72 hours. CMP     Component Value Date/Time   NA 137 12/15/2015 0835   K 4.1 12/15/2015 0835   CL 107 12/15/2015 0835   CO2 25 12/15/2015 0835   GLUCOSE 123 (H) 12/15/2015 0835   BUN 10 12/15/2015 0835   CREATININE 0.84 12/15/2015 0835   CALCIUM 7.7 (L) 12/15/2015 0835   PROT 5.9 (L) 12/13/2015 0454   ALBUMIN 2.3 (L) 12/13/2015 0454   AST 22 12/13/2015 0454   ALT 39 12/13/2015 0454   ALKPHOS 250 (H) 12/13/2015 0454   BILITOT 0.5 12/13/2015 0454   GFRNONAA >60 12/15/2015 0835   GFRAA >60 12/15/2015 0835   Lipase     Component Value Date/Time   LIPASE 44 11/13/2015 2300   Studies/Results: Ct Abdomen Pelvis W Contrast  Result  Date: 12/15/2015 CLINICAL DATA:  Follow-up intra abdominal infection status post perforated diverticulitis. EXAM: CT ABDOMEN AND PELVIS WITH CONTRAST TECHNIQUE: Multidetector CT imaging of the abdomen and pelvis was performed using the standard protocol following bolus administration of intravenous contrast. CONTRAST:  128mL ISOVUE-300 IOPAMIDOL (ISOVUE-300) INJECTION 61% COMPARISON:  CT scan of November 04, 2015. FINDINGS: Lower chest: Minimal right posterior basilar subsegmental atelectasis is noted. Mild left pleural effusion is noted with adjacent left lower lobe atelectasis. Hepatobiliary: No gallstones are noted.  The liver appears normal. Pancreas: Normal. Spleen: Normal. Adrenals/Urinary Tract: Adrenal glands appear normal. Bilateral nonobstructive nephrolithiasis is noted. Stable right renal cyst. No hydronephrosis or renal obstruction is noted. Urinary bladder appears normal. Stomach/Bowel: Status post surgical repair of hiatal hernia. Gastrostomy tube is in grossly good position. Duodenal feeding tube is also in good position with distal tip in second portion of duodenum. The appendix appears normal. There is no evidence of bowel obstruction. Stable ostomy is noted in left lower quadrant with small peristomal hernia. Vascular/Lymphatic: Atherosclerosis of abdominal aorta is noted without aneurysm formation. No significant adenopathy is noted. Reproductive: Stable mild prostatic enlargement is noted. Other: No abnormal fluid collection is noted. Surgical drain is seen entering right lower quadrant with distal tip near the gastric cardia. Musculoskeletal: Mild degenerative disc disease is  noted at L5-S1. IMPRESSION: Mild left pleural effusion with adjacent left lower lobe atelectasis. Stable mild prostatic enlargement. Aortic atherosclerosis. Bilateral nonobstructive nephrolithiasis. Status post hiatal hernia repair. Gastrostomy and duodenal feeding tube in grossly good position. Stable ostomy in left lower  quadrant with small peristomal hernia. Surgical drain is seen entering right lower quadrant with distal tip near gastric cardia. Electronically Signed   By: Marijo Conception, M.D.   On: 12/15/2015 15:25   Anti-infectives: Anti-infectives    Start     Dose/Rate Route Frequency Ordered Stop   12/13/15 0000  piperacillin-tazobactam (ZOSYN) IVPB 3.375 g     3.375 g 12.5 mL/hr over 240 Minutes Intravenous Every 8 hours 12/12/15 1804     12/12/15 1800  piperacillin-tazobactam (ZOSYN) IVPB 3.375 g     3.375 g 100 mL/hr over 30 Minutes Intravenous  Once 12/12/15 1745 12/12/15 1830     Assessment/Plan Malnutrition, dehydration - readmitted 9/15 S/p sigmoid diverticulitis with perforation status post sigmoid colectomy and colostomy 07/05/15 Dr. [name] S/p Repair of perforated ulcer, resection of distal stomach  With [facility] reconstr.-10/30/15 Dr. Hulen Skains S/p open reduction of hiatal hernia, repair of hiatal hernia, resection of small bowel with, anastomosis placement of gastrostomy tube, placement of duodenostomy tube 11/21/15 Dr. Kieth Brightly. Hospitalized 8/17-9/08/17 - WBC normalized  - CT scan 9/19 negative for abscess/obstruction - RLQ JP drain ~70cc/24 - UGI w/ water sol contrast 12/17/15 - hold tube feeds for at least one hour prior to study.   AKI - SCr normalized   FEN: tube feed goal 65 cc/hr, clear liquid diet ID: DVT Proph: Lovenox, SCD's  Dispo: UGI today to evaluate gastric emptying    LOS: 5 days    Jill Alexanders , [facility] 12/17/2015, 10:00 AM Pager: [phone] Consults: [phone] Mon-Fri 7:00 am-4:30 pm Sat-Sun 7:00 am-11:30 am

## 2015-12-17 NOTE — Progress Notes (Signed)
Patient complained of feeling full in stomach and feel like vomiting. Decreased tube feeding to 50 per his request. Will continue to monitor.

## 2015-12-18 LAB — GLUCOSE, CAPILLARY
GLUCOSE-CAPILLARY: 110 mg/dL — AB (ref 65–99)
GLUCOSE-CAPILLARY: 104 mg/dL — AB (ref 65–99)

## 2015-12-18 MED ORDER — ENSURE ENLIVE PO LIQD
237.0000 mL | Freq: Two times a day (BID) | ORAL | Status: DC
  Administered 2015-12-19 (×2): 237 mL via ORAL

## 2015-12-18 NOTE — Progress Notes (Signed)
  Subjective: Doing well this AM tol CLD/TFs  Objective: Vital signs in last 24 hours: Temp:  [98.5 F (36.9 C)-100.3 F (37.9 C)] 98.6 F (37 C) (09/21 0549) Pulse Rate:  [90-103] 90 (09/21 0549) Resp:  [17-18] 17 (09/21 0549) BP: (102-120)/(67-74) 102/67 (09/21 0549) SpO2:  [97 %] 97 % (09/21 0549) Weight:  [66.5 kg (146 lb 11.2 oz)] 66.5 kg (146 lb 11.2 oz) (09/21 0549) Last BM Date: 12/17/15  Intake/Output from previous day: 09/20 0701 - 09/21 0700 In: 4982.1 [P.O.:480; I.V.:3200; NG/GT:1202.1; IV Piggyback:100] Out: 3250 [Urine:2300; Stool:950] Intake/Output this shift: No intake/output data recorded.  General appearance: alert and cooperative GI: soft, non-tender; bowel sounds normal; no masses,  no organomegaly  Lab Results:   Recent Labs  12/15/15 0835  WBC 8.9  HGB 9.7*  HCT 31.7*  PLT 351   BMET  Recent Labs  12/15/15 0835  NA 137  K 4.1  CL 107  CO2 25  GLUCOSE 123*  BUN 10  CREATININE 0.84  CALCIUM 7.7*   PT/INR No results for input(s): LABPROT, INR in the last 72 hours. ABG No results for input(s): PHART, HCO3 in the last 72 hours.  Invalid input(s): PCO2, PO2  Studies/Results: Dg Ugi W/water Sol Cm  Result Date: 12/17/2015 CLINICAL DATA:  Delayed gastric emptying.  Decreased oral intake. Postop hiatal hernia repair. Postop Billroth 2 for peptic ulcer disease. Postop sigmoid colectomy for diverticulitis. Check for leak. EXAM: UPPER GI SERIES WITH KUB TECHNIQUE: After obtaining a scout radiograph a routine upper GI series was performed using Omnipaque 300 FLUOROSCOPY TIME:  Fluoroscopy Time:  1 minutes 36 second Radiation Exposure Index (if provided by the fluoroscopic device): Number of Acquired Spot Images: 0 COMPARISON:  CT abdomen pelvis 12/15/2015 FINDINGS: Preliminary KUB reveals feeding tube in the duodenum. Gastrostomy is present in the stomach. Normal bowel gas pattern. Esophageal motility normal.  No stricture or mass in the  esophagus. Status post Billroth 2. Gastrojejunostomy is patent. Stomach empties readily into the jejunum. No stricture or mass. Negative for leak.  Negative for small bowel obstruction. IMPRESSION: Normal emptying of the stomach with gastrojejunostomy. No leak or obstruction. Electronically Signed   By: Franchot Gallo M.D.   On: 12/17/2015 13:29    Anti-infectives: Anti-infectives    Start     Dose/Rate Route Frequency Ordered Stop   12/13/15 0000  piperacillin-tazobactam (ZOSYN) IVPB 3.375 g     3.375 g 12.5 mL/hr over 240 Minutes Intravenous Every 8 hours 12/12/15 1804     12/12/15 1800  piperacillin-tazobactam (ZOSYN) IVPB 3.375 g     3.375 g 100 mL/hr over 30 Minutes Intravenous  Once 12/12/15 1745 12/12/15 1830      Assessment/Plan: Malnutrition, dehydration - readmitted 9/15 S/p sigmoid diverticulitis with perforation status post sigmoid colectomy and colostomy 07/05/15 Dr. [name] S/p Repair of perforated ulcer, resection of distal stomach With [facility] reconstr.-10/30/15 Dr. Hulen Skains S/p open reduction of hiatal hernia, repair of hiatal hernia, resection of small bowel with, anastomosis placement of gastrostomy tube, placement of duodenostomy tube 11/21/15 Dr. Kieth Brightly. Hospitalized 8/17-9/08/17  AKI - SCr normalized  FEN: tube feed goal 65 cc/hr, tol clears, will adv to full liq, may req cycling of TF at night if con't tol PO DVT Proph: Lovenox, SCD's  Dispo: adv diet   LOS: 6 days    Rosario Jacks., Anne Hahn 12/18/2015

## 2015-12-18 NOTE — Progress Notes (Addendum)
Nutrition Follow-up  DOCUMENTATION CODES:   Severe malnutrition in context of chronic illness  INTERVENTION:   -D/c Boost Breeze due to poor acceptance  -Ensure Enlive po BID, each supplement provides 350 kcal and 20 grams of protein  -Continue Osmolite 1.2@ 82m/hr via PEG  Tube feeding regimen provides 1872kcal (100% of needs), 86grams of protein, and 12762mof H2O.   -Continue Multivitamin with minerals daily  -If patient is discharged, recommend nocturnal tube feedings at home: Osmolite 1.2 @ 100 ml/hr for 12 hours every night 2000 hr to 0800 hr to meet 80% of energy/protein needs.    NUTRITION DIAGNOSIS:   Malnutrition related to chronic illness, altered GI function as evidenced by severe depletion of muscle mass, severe depletion of body fat, percent weight loss, energy intake < 75% for > or equal to 1 month.  Ongoing  GOAL:   Patient will meet greater than or equal to 90% of their needs  Met with TF  MONITOR:   TF tolerance, PO intake, Labs, Weight trends, I & O's  REASON FOR ASSESSMENT:   Consult, Malnutrition Screening Tool Enteral/tube feeding initiation and management  ASSESSMENT:   [age]ear old male underwent open reduction of hiatal hernia with small bowel resection and repair of hiatal hernia for acutely incarcerated hernia leading to perforation of small intestine. Postoperatively he did fairly well as able tolerate food and eventually was discharged without G-tube feeds. Since then he has had increasing amounts of nausea not controlled with typical oral medications. He's had significant weight loss since discharge from hospital.   Per MD notes, UGI revealed no leak.   Pt receiving nursing care at time of visit.   Case discussed with RN. She reports pt is tolerating TF well (now at goal rate). Diet has been advanced to full liquids. Per discussion with RN, pt is taking in a fair amount of PO's, however, experienced some nausea when consuming  orange juice this morning. RN confirmed that pt does not like Boost Breeze supplements, however, tolerated Ensure. RD will d/c Boost Breeze and add Ensure.   Osmolite 1.2 is currently infusing via PEG @ 65 ml/hr (which provides 1872kcal (100% of needs), 86grams of protein, and 127976mf H2O.   RN also discussed potential to transition to nocturnal feedings. RD will provide recommendations.   Labs reviewed: CBGS: 104-138.   Diet Order:  Diet full liquid Room service appropriate? Yes; Fluid consistency: Thin  Skin:  Reviewed, no issues  Last BM:  12/18/15  Height:   Ht Readings from Last 1 Encounters:  12/12/15 '5\' 5"'  (1.651 m)    Weight:   Wt Readings from Last 1 Encounters:  12/18/15 146 lb 11.2 oz (66.5 kg)    Ideal Body Weight:  64.5 kg  BMI:  Body mass index is 24.41 kg/m.  Estimated Nutritional Needs:   Kcal:  1800-2000  Protein:  85-95 grams  Fluid:  2 L/day  EDUCATION NEEDS:   No education needs identified at this time  Fredrick Dray A. WilJimmye NormanD, LDN, CDE Pager: [phone] hours Pager: [phone]

## 2015-12-19 LAB — GLUCOSE, CAPILLARY
GLUCOSE-CAPILLARY: 138 mg/dL — AB (ref 65–99)
Glucose-Capillary: 116 mg/dL — ABNORMAL HIGH (ref 65–99)
Glucose-Capillary: 105 mg/dL — ABNORMAL HIGH (ref 65–99)

## 2015-12-19 MED ORDER — POLYVINYL ALCOHOL 1.4 % OP SOLN
2.0000 [drp] | OPHTHALMIC | Status: DC | PRN
  Filled 2015-12-19: qty 15

## 2015-12-19 NOTE — Progress Notes (Signed)
Patient expresses that he wants the tube feeds decreased for  A few hours due to fullness. Osmolite decreased to 50cc/hr.

## 2015-12-19 NOTE — Progress Notes (Signed)
[facility] Progress Note     Subjective: Pain controlled. Mild abdominal pain with eating/drinking. Does not eat much because he does not like the way the food tastes - is not enjoying any of the full liquid options. Ambulating. Stool and gas in ostomy pouch. Tolerating tube feeds - 65 cc/hr during the day, 50 cc/hr overnight.  VSS Objective: Vital signs in last 24 hours: Temp:  [98.2 F (36.8 C)-98.9 F (37.2 C)] 98.2 F (36.8 C) (09/22 0549) Pulse Rate:  [84-96] 84 (09/22 0549) Resp:  [16-17] 17 (09/22 0549) BP: (118-135)/(67-79) 120/69 (09/22 0549) SpO2:  [95 %-97 %] 96 % (09/22 0549) Last BM Date: 12/19/15  Intake/Output from previous day: 09/21 0701 - 09/22 0700 In: 4691.3 [P.O.:720; I.V.:2348.3; NG/GT:1573; IV Piggyback:50] Out: 3025 [Urine:2650; Drains:200; Stool:175] Intake/Output this shift: Total I/O In: 71 [P.O.:60] Out: 125 [Urine:125]  PE: Gen:  Alert, NAD, pleasant Abd: Soft, NT/ND, +BS, stoma viable, gas and stool in ostomy pouch  Lab Results:  No results for input(s): WBC, HGB, HCT, PLT in the last 72 hours. BMET No results for input(s): NA, K, CL, CO2, GLUCOSE, BUN, CREATININE, CALCIUM in the last 72 hours. PT/INR No results for input(s): LABPROT, INR in the last 72 hours. CMP     Component Value Date/Time   NA 137 12/15/2015 0835   K 4.1 12/15/2015 0835   CL 107 12/15/2015 0835   CO2 25 12/15/2015 0835   GLUCOSE 123 (H) 12/15/2015 0835   BUN 10 12/15/2015 0835   CREATININE 0.84 12/15/2015 0835   CALCIUM 7.7 (L) 12/15/2015 0835   PROT 5.9 (L) 12/13/2015 0454   ALBUMIN 2.3 (L) 12/13/2015 0454   AST 22 12/13/2015 0454   ALT 39 12/13/2015 0454   ALKPHOS 250 (H) 12/13/2015 0454   BILITOT 0.5 12/13/2015 0454   GFRNONAA >60 12/15/2015 0835   GFRAA >60 12/15/2015 0835   Lipase     Component Value Date/Time   LIPASE 44 11/13/2015 2300   Studies/Results: Dg Ugi W/water Sol Cm  Result Date: 12/17/2015 CLINICAL DATA:  Delayed  gastric emptying.  Decreased oral intake. Postop hiatal hernia repair. Postop Billroth 2 for peptic ulcer disease. Postop sigmoid colectomy for diverticulitis. Check for leak. EXAM: UPPER GI SERIES WITH KUB TECHNIQUE: After obtaining a scout radiograph a routine upper GI series was performed using Omnipaque 300 FLUOROSCOPY TIME:  Fluoroscopy Time:  1 minutes 36 second Radiation Exposure Index (if provided by the fluoroscopic device): Number of Acquired Spot Images: 0 COMPARISON:  CT abdomen pelvis 12/15/2015 FINDINGS: Preliminary KUB reveals feeding tube in the duodenum. Gastrostomy is present in the stomach. Normal bowel gas pattern. Esophageal motility normal.  No stricture or mass in the esophagus. Status post Billroth 2. Gastrojejunostomy is patent. Stomach empties readily into the jejunum. No stricture or mass. Negative for leak.  Negative for small bowel obstruction. IMPRESSION: Normal emptying of the stomach with gastrojejunostomy. No leak or obstruction. Electronically Signed   By: Franchot Gallo M.D.   On: 12/17/2015 13:29   Anti-infectives: Anti-infectives    Start     Dose/Rate Route Frequency Ordered Stop   12/13/15 0000  piperacillin-tazobactam (ZOSYN) IVPB 3.375 g     3.375 g 12.5 mL/hr over 240 Minutes Intravenous Every 8 hours 12/12/15 1804     12/12/15 1800  piperacillin-tazobactam (ZOSYN) IVPB 3.375 g     3.375 g 100 mL/hr over 30 Minutes Intravenous  Once 12/12/15 1745 12/12/15 1830     Assessment/Plan Malnutrition, dehydration - readmitted 9/15  S/p sigmoid diverticulitis with perforation status post sigmoid colectomy and colostomy 07/05/15 Dr. [name] S/p Repair of perforated ulcer, resection of distal stomach With [facility] reconstr.-10/30/15 Dr. Hulen Skains S/p open reduction of hiatal hernia, repair of hiatal hernia, resection of small bowel with, anastomosis placement of gastrostomy tube, placement of duodenostomy tube 11/21/15 Dr. Kieth Brightly. Hospitalized 8/17-9/08/17  -UGI  12/16/15 - no leak, good gastric emptying  - Reglan - calorie count pending   AKI - SCr normalized FEN - tube feed goal 65 cc/hr (osmolite), continue TF at night, advance to soft diet         - getting Boost BID ID: Zosyn (d/c ?) DVT Proph: heparin, SCD;s    LOS: 7 days    Jill Alexanders , [facility] 12/19/2015, 10:23 AM Pager: [phone] Consults: [phone] Mon-Fri 7:00 am-4:30 pm Sat-Sun 7:00 am-11:30 am

## 2015-12-19 NOTE — Progress Notes (Signed)
Calorie Count Note  48 hour calorie count ordered.  Diet: Full Liquid Supplements: Osmolite 1.2@ 55m/hr via PEG  Case discussed with RN, who placed calorie count envelope on door for data collection. She reports that pt continues to tolerate TF well. Pt currently receiving Osmolite 1.2@ 678mhr via PEG. RN reports that TF rate was decreased to 50 ml/hr for a few hours last night, due to pt complaining of fullness. RN reports plans to transition to nocturnal feedings, but unsure of when MD is requesting that change.   RN reports that pt accepts Ensure well, however, requested Boost Breeze today.   Nutrition Dx: Malnutrition related to chronic illness, altered GI function as evidenced by severe depletion of muscle mass, severe depletion of body fat, percent weight loss, energy intake < 75% for > or equal to 1 month; ongoing  Goal: Patient will meet greater than or equal to 90% of their needs; met with TF  Intervention:   -RD will follow for calorie count results on Monday, 12/22/15 -Ensure Enlive po BID, each supplement provides 350 kcal and 20 grams of protein  -Continue Osmolite 1.2@ 659mr via PEG  Tube feeding regimen provides 1872kcal (100% of needs), 86grams of protein, and 1279m45m H2O.   -ContinueMultivitamin with minerals daily  -If patient is discharged, recommend nocturnal tube feedings at home: Osmolite 1.2 @ 100 ml/hr for 12 hours every night 2000 hr to 0800 hr to meet 80% of energy/protein needs.   Zabian Swayne A. WillJimmye Norman, LDN, CDE Pager: [phone] hours Pager: [phone]

## 2015-12-20 LAB — GLUCOSE, CAPILLARY
GLUCOSE-CAPILLARY: 111 mg/dL — AB (ref 65–99)
GLUCOSE-CAPILLARY: 116 mg/dL — AB (ref 65–99)
Glucose-Capillary: 108 mg/dL — ABNORMAL HIGH (ref 65–99)

## 2015-12-20 NOTE — Progress Notes (Signed)
  Subjective: No complaints this morning. Feels much better than admission. However, he has been unable to take much at all by mouth due to nausea and he states that increases his ostomy output. Tolerating tube feedings.  Objective: Vital signs in last 24 hours: Temp:  [98 F (36.7 C)-98.5 F (36.9 C)] 98.3 F (36.8 C) (09/23 0529) Pulse Rate:  [77-91] 77 (09/23 0529) Resp:  [17-18] 17 (09/23 0529) BP: (111-119)/(67-74) 111/70 (09/23 0529) SpO2:  [95 %-97 %] 96 % (09/23 0529) Weight:  [69.8 kg (153 lb 12.8 oz)] 69.8 kg (153 lb 12.8 oz) (09/23 0529) Last BM Date: 12/20/15  Intake/Output from previous day: 09/22 0701 - 09/23 0700 In: 4327.9 [P.O.:600; I.V.:2196.7; NG/GT:1381.3; IV Piggyback:150] Out: 2975 [Urine:2725; Stool:250] Intake/Output this shift: No intake/output data recorded.  General appearance: alert, cooperative and no distress GI: normal findings: soft, non-tender and Ostomy with stool and gas Incision/Wound: Clean and dry  Lab Results:  No results for input(s): WBC, HGB, HCT, PLT in the last 72 hours. BMET No results for input(s): NA, K, CL, CO2, GLUCOSE, BUN, CREATININE, CALCIUM in the last 72 hours.   Studies/Results: No results found.  Anti-infectives: Anti-infectives    Start     Dose/Rate Route Frequency Ordered Stop   12/13/15 0000  piperacillin-tazobactam (ZOSYN) IVPB 3.375 g     3.375 g 12.5 mL/hr over 240 Minutes Intravenous Every 8 hours 12/12/15 1804     12/12/15 1800  piperacillin-tazobactam (ZOSYN) IVPB 3.375 g     3.375 g 100 mL/hr over 30 Minutes Intravenous  Once 12/12/15 1745 12/12/15 1830      Assessment/Plan: Malnutrition, dehydration - readmitted 9/15 S/p sigmoid diverticulitis with perforation status post sigmoid colectomy and colostomy 07/05/15 Dr. [name] S/p Repair of perforated ulcer, resection of distal stomach With [facility] reconstr.-10/30/15 Dr. Hulen Skains S/p open reduction of hiatal hernia, repair of hiatal hernia,  resection of small bowel with, anastomosis placement of gastrostomy tube, placement of duodenostomy tube 11/21/15 Dr. Kieth Brightly. Hospitalized 8/17-9/08/17 Much improved since admission. Upper GI series showed no obstruction. Likely delayed gastric emptying. Continue tube feeding and oral diet as tolerated ID-CT scan without evidence of infection, no fever or white count. Discontinue antibiotics.    LOS: 8 days    Ibtisam Benge T 9/23/2017Patient ID: Miguel [name], male   DOB: [birth year], 65 y.o.   MRN: [id number]

## 2015-12-21 LAB — GLUCOSE, CAPILLARY
GLUCOSE-CAPILLARY: 107 mg/dL — AB (ref 65–99)
Glucose-Capillary: 120 mg/dL — ABNORMAL HIGH (ref 65–99)
Glucose-Capillary: 128 mg/dL — ABNORMAL HIGH (ref 65–99)
Glucose-Capillary: 93 mg/dL (ref 65–99)

## 2015-12-21 MED ORDER — FUROSEMIDE NICU ORAL SYRINGE 10 MG/ML
10.0000 mg | Freq: Once | ORAL | Status: AC
  Administered 2015-12-21: 10 mg via ORAL
  Filled 2015-12-21: qty 1

## 2015-12-21 NOTE — Progress Notes (Signed)
  Subjective: No complaints this morning. Feels much better than admission. Still unable to take much at all by mouth due to nausea and he states that increases his ostomy output. Tolerating tube feedings. He has swelling of feet and ankles today.  Objective: Vital signs in last 24 hours: Temp:  [97.8 F (36.6 C)-98.6 F (37 C)] 97.8 F (36.6 C) (09/24 0700) Pulse Rate:  [80-87] 80 (09/24 0700) Resp:  [17-18] 17 (09/24 0700) BP: (120-127)/(74-75) 127/75 (09/24 0700) SpO2:  [94 %-97 %] 94 % (09/24 0700) Last BM Date: 12/20/15  Intake/Output from previous day: 09/23 0701 - 09/24 0700 In: 3304 [P.O.:357; I.V.:1941.7; NG/GT:1005.3] Out: 2750 [Urine:2550; Stool:200] Intake/Output this shift: Total I/O In: 240 [P.O.:240] Out: 550 [Urine:550]  General appearance: alert, cooperative and no distress GI: normal findings: soft, non-tender and Ostomy with stool and gas Incision/Wound: Clean and dry  Lab Results:  No results for input(s): WBC, HGB, HCT, PLT in the last 72 hours. BMET No results for input(s): NA, K, CL, CO2, GLUCOSE, BUN, CREATININE, CALCIUM in the last 72 hours.   Studies/Results: No results found.  Anti-infectives: Anti-infectives    Start     Dose/Rate Route Frequency Ordered Stop   12/13/15 0000  piperacillin-tazobactam (ZOSYN) IVPB 3.375 g  Status:  Discontinued     3.375 g 12.5 mL/hr over 240 Minutes Intravenous Every 8 hours 12/12/15 1804 12/20/15 1050   12/12/15 1800  piperacillin-tazobactam (ZOSYN) IVPB 3.375 g     3.375 g 100 mL/hr over 30 Minutes Intravenous  Once 12/12/15 1745 12/12/15 1830      Assessment/Plan: Malnutrition, dehydration - readmitted 9/15 S/p sigmoid diverticulitis with perforation status post sigmoid colectomy and colostomy 07/05/15 Dr. [name] S/p Repair of perforated ulcer, resection of distal stomach With [facility] reconstr.-10/30/15 Dr. Hulen Skains S/p open reduction of hiatal hernia, repair of hiatal hernia, resection of small  bowel with, anastomosis placement of gastrostomy tube, placement of duodenostomy tube 11/21/15 Dr. Kieth Brightly. Hospitalized 8/17-9/08/17 Much improved since admission. Upper GI series showed no obstruction. Likely delayed gastric emptying. Continue tube feeding and oral diet as tolerated ID-CT scan without evidence of infection, no fever or white count. Antibiotics stopped. Lower extremity edema. Cut back on IV fluids and give one small dose of Lasix Likely will need to go home on tube feedings    LOS: 9 days    Palmer Shorey T 9/24/2017Patient ID: [name], male   DOB: 1950-09-21, 65 y.o.   MRN: [id number] Patient ID: YEZEN LANDMAN, male   DOB: [birth year], 65 y.o.   MRN: [id number]

## 2015-12-22 LAB — COMPREHENSIVE METABOLIC PANEL
ALT: 21 U/L (ref 17–63)
AST: 21 U/L (ref 15–41)
Albumin: 2.2 g/dL — ABNORMAL LOW (ref 3.5–5.0)
Alkaline Phosphatase: 105 U/L (ref 38–126)
Anion gap: 7 (ref 5–15)
BILIRUBIN TOTAL: 0.6 mg/dL (ref 0.3–1.2)
CO2: 31 mmol/L (ref 22–32)
Calcium: 8.2 mg/dL — ABNORMAL LOW (ref 8.9–10.3)
Chloride: 102 mmol/L (ref 101–111)
Creatinine, Ser: 0.66 mg/dL (ref 0.61–1.24)
GFR calc Af Amer: >60 mL/min (ref >60)
Glucose, Bld: 133 mg/dL — ABNORMAL HIGH (ref 65–99)
POTASSIUM: 3.8 mmol/L (ref 3.5–5.1)
Sodium: 140 mmol/L (ref 135–145)
TOTAL PROTEIN: 5.8 g/dL — AB (ref 6.5–8.1)

## 2015-12-22 LAB — BASIC METABOLIC PANEL
Anion gap: 7 (ref 5–15)
CALCIUM: 7.8 mg/dL — AB (ref 8.9–10.3)
CO2: 30 mmol/L (ref 22–32)
Chloride: 101 mmol/L (ref 101–111)
Creatinine, Ser: 0.58 mg/dL — ABNORMAL LOW (ref 0.61–1.24)
GFR calc Af Amer: >60 mL/min (ref >60)
GLUCOSE: 100 mg/dL — AB (ref 65–99)
POTASSIUM: 3.5 mmol/L (ref 3.5–5.1)
Sodium: 138 mmol/L (ref 135–145)

## 2015-12-22 LAB — GLUCOSE, CAPILLARY
GLUCOSE-CAPILLARY: 88 mg/dL (ref 65–99)
Glucose-Capillary: 120 mg/dL — ABNORMAL HIGH (ref 65–99)

## 2015-12-22 MED ORDER — OSMOLITE 1.5 CAL PO LIQD
1000.0000 mL | ORAL | Status: DC
  Administered 2015-12-22 – 2015-12-23 (×2): 1000 mL
  Filled 2015-12-22 (×5): qty 1000

## 2015-12-22 NOTE — Care Management Note (Signed)
Case Management Note  Patient Details  Name: Miguel [name] MRN: [id number] Date of Birth: May 22, 1950  Subjective/Objective:                    Action/Plan:  Patient was active with [facility] prior to admission , will need home health RN order and face to face , orders for tube feedings at home. Awaiting Dietician recommendations for home and see if patient can tolerate. Expected Discharge Date:                  Expected Discharge Plan:  [place]  In-House Referral:     Discharge planning Services  CM Consult  Post Acute Care Choice:    Choice offered to:  Patient  DME Arranged:  Tube feeding, Tube feeding pump DME Agency:  [place]:    [place] Agency:     Status of Service:  In process, will continue to follow  If discussed at Long Length of Stay Meetings, dates discussed:    Additional Comments:  Marilu Favre, RN 12/22/2015, 12:07 PM

## 2015-12-22 NOTE — Care Management Note (Signed)
Case Management Note  Patient Details  Name: TSION RAIRDON MRN: [id number] Date of Birth: [birth year]  Subjective/Objective:                    Action/Plan:   Expected Discharge Date:                  Expected Discharge Plan:  [place]  In-House Referral:     Discharge planning Services  CM Consult  Post Acute Care Choice:  Durable Medical Equipment, Home Health Choice offered to:  Patient  DME Arranged:  Tube feeding, Tube feeding pump DME Agency:  [place]:  RN [facility] Agency:  [place]  Status of Service:  In process, will continue to follow  If discussed at Long Length of Stay Meetings, dates discussed:    Additional Comments:  Marilu Favre, RN 12/22/2015, 1:22 PM

## 2015-12-22 NOTE — Progress Notes (Addendum)
Nutrition Follow-up  DOCUMENTATION CODES:   Severe malnutrition in context of chronic illness  INTERVENTION:   Initiate Osmolite 1.5 @ 55 ml/hr via PEG over 18 hour period (1600-1000)  Tube feeding regimen provides 1485 kcal (83% of needs), 62 grams of protein, and 754 ml of H2O.   -Continue MVI daily -D/c Ensure Enlive, due to poor acceptance  -Recommendations for home TF:  Initiate Osmolite 1.5 @ 55 ml/hr via PEG over 18 hour period (ex.1600-1000)  Recommend 75 ml free water flush 4 times daily  Tube feeding regimen provides 1485 kcal (83% of needs), 62 grams of protein, and 754 ml of H2O (1054 ml fluid with addition of free water regimen).   NUTRITION DIAGNOSIS:   Malnutrition related to chronic illness, altered GI function as evidenced by severe depletion of muscle mass, severe depletion of body fat, percent weight loss, energy intake < 75% for > or equal to 1 month.  Ongoing  GOAL:   Patient will meet greater than or equal to 90% of their needs  Progressing  MONITOR:   TF tolerance, PO intake, Labs, Weight trends, I & O's  REASON FOR ASSESSMENT:   Consult Enteral/tube feeding initiation and management  ASSESSMENT:   65 year old male underwent open reduction of hiatal hernia with small bowel resection and repair of hiatal hernia for acutely incarcerated hernia leading to perforation of small intestine. Postoperatively he did fairly well as able tolerate food and eventually was discharged without G-tube feeds. Since then he has had increasing amounts of nausea not controlled with typical oral medications. He's had significant weight loss since discharge from hospital.   RD followed up on calorie count and received consult from general surgery service to re-evaluate TF regimen.   Spoke with pt and pt wife at bedside. Pt reports he continues to tolerate TF well, however, complains of feeling full and bloated overnight. He has requested that TF rate to be decreased  to 50 ml/hr. Noted Osmolite 1.2 is currently infusing @ 50 ml/hr, which provides 1440 kcals, 67 grams protein, and 984 ml fluid daily (meeting 80% of estimated kcal needs and 84% of estimated protein needs). Pt reports he feels like his appetite is slowly improving and he is actually desiring food (reports he is craving fried eggs and liver pudding). Discussed ways to increase calorie and protein intake in his diet given limitations of full liquid diet. Pt and pt wife agreeable to trial more concentrated formula (Osmolite 1.5) to assist with pt's bloating, assist with meeting nutritional goals, and transition towards cyclic feedings.   Calorie count data reviewed and analyzed as follows:   Day 1 Breakfast: 250 kcals 7 grams protein Lunch: no data available Dinner: no data available Supplements: accepted one Boost Breeze supplements  Total intake (PO's only): 250 kcal (14% of minimum estimated needs)  7 grams protein (8% of minimum estimated needs)  Total intake (PO's and TF): 1690 kcal (94% of minimum estimated needs)  74 grams protein (87% of minimum estimated needs)  Day 2 Breakfast: 160 kcals, 3 grams protein Lunch: 230 kcals, 5 grams protein Dinner: 93 kcals, 2 grams protein Supplements: refused supplements per RN  Total intake (PO's only): 483 kcal (10% of minimum estimated needs)  10 protein (12% of minimum estimated needs)  Total intake (PO's and TF): 1923 kcal (100% of minimum estimated needs)  77 grams protein (91% of minimum estimated needs)  On average, pt is consuming approximately 12% of estimated kcal needs and 10% of estimated protein needs  by mouth, however, meeting 97% of estimated kcal needs and 89% of estimated protein needs, with current TF rate (Osmolite 1.2 is currently infusing @ 50 ml/hr, which provides 1440 kcals, 67 grams protein, and 984 ml fluid daily).   Case discussed with RN, who confirms continued poor oral intake and refusal of supplements (both  Ensure Enlive and [employer]). RN confirmed eventual plan to d/c home with nocturnal feedings.   Paged by [facility] and discussed plan of care and nutrition plan for discharge. Will transition to nocturnal feedings while in hospital to ensure tolerance prior to d/c home.   Labs reviewed: CBGS: 93-128.  Diet Order:  Diet full liquid Room service appropriate? Yes; Fluid consistency: Thin  Skin:  Reviewed, no issues  Last BM:  12/22/15  Height:   Ht Readings from Last 1 Encounters:  12/12/15 5\' 5"  (1.651 m)    Weight:   Wt Readings from Last 1 Encounters:  12/20/15 153 lb 12.8 oz (69.8 kg)    Ideal Body Weight:  64.5 kg  BMI:  Body mass index is 25.59 kg/m.  Estimated Nutritional Needs:   Kcal:  1800-2000  Protein:  85-95 grams  Fluid:  2 L/day  EDUCATION NEEDS:   No education needs identified at this time  Everlyn Farabaugh A. Jimmye Norman, RD, LDN, CDE Pager: [phone] After hours Pager: [phone]

## 2015-12-22 NOTE — Progress Notes (Signed)
Subjective: He seems to be doing well with the tube feedings, he notes that he develops nausea with very little PO intake.  He is tolerating the 50 ml/hr tube feed and could take more during the day,but says at night he feels like it is coming back up when he lies down.  He has a regular bed at home.    Objective: Vital signs in last 24 hours: Temp:  [98.3 F (36.8 C)-98.8 F (37.1 C)] 98.8 F (37.1 C) (09/25 0531) Pulse Rate:  [89-104] 89 (09/25 0531) Resp:  [16-18] 18 (09/25 0531) BP: (117-132)/(76-89) 132/89 (09/25 0531) SpO2:  [94 %-96 %] 94 % (09/25 0531) Last BM Date: 12/22/15 (ostomy) Diet:  Full liquids/Tube feedings 240 PO recorded 200 IV recorded 1000 NG tube feeding recorded Urine 2525 Afebrile, VSS No labs today last labs 9/24 12/17/15 UGI:  Normal emptying of the stomach with gastrojejunostomy. No leak or obstruction.   Intake/Output from previous day: 09/24 0701 - 09/25 0700 In: 1440 [P.O.:240; I.V.:200; NG/GT:1000] Out: 2525 [Urine:2525] Intake/Output this shift: No intake/output data recorded.  General appearance: alert, cooperative, no distress and up walking in the halls.  Seems very stable walking. Resp: clear to auscultation bilaterally GI: soft, + BS, nothing listed as output from the colostomy, but he put out about 350 ml yesterday (best guess) Both tube sites look good, I cleaned the duodenostomy site.  Midline incision is almost completely healed and just a small area at the top of the incision needs a wet 2 x 2 to cover.   Extremities: he has + 2-3 edema both lower legs.    Lab Results:  No results for input(s): WBC, HGB, HCT, PLT in the last 72 hours.  BMET  Recent Labs  12/22/15 0537  NA 138  K 3.5  CL 101  CO2 30  GLUCOSE 100*  BUN <5*  CREATININE 0.58*  CALCIUM 7.8*   PT/INR No results for input(s): LABPROT, INR in the last 72 hours.  No results for input(s): AST, ALT, ALKPHOS, BILITOT, PROT, ALBUMIN in the last 168 hours.    Lipase     Component Value Date/Time   LIPASE 44 11/13/2015 2300     Studies/Results: No results found.  Medications: . feeding supplement (ENSURE ENLIVE)  237 mL Oral BID BM  . heparin  5,000 Units Subcutaneous Q8H  . metoCLOPramide  5 mg Per Tube TID AC  . multivitamin with minerals  1 tablet Oral Daily   . sodium chloride 10 mL/hr (12/21/15 1035)  . feeding supplement (OSMOLITE 1.2 CAL) 1,000 mL (12/20/15 2219)    Assessment/Plan Malnutrition, dehydration - readmitted 9/15 S/p sigmoid diverticulitis with perforation status post sigmoid colectomy and colostomy 07/05/15 Dr. [name] S/p Repair of perforated ulcer, resection of distal stomach With [facility] reconstr.-10/30/15 Dr. Hulen Skains S/p open reduction of hiatal hernia, repair of hiatal hernia, resection of small bowel with, anastomosis placement of gastrostomy tube, placement of duodenostomy tube 11/21/15 Dr. Kieth Brightly. Hospitalized 8/17-9/08/17 FEN: Tube Feeding (goal 65 cc/hr- uncomfortable at current rate and ask to be left at 50 ml/hr) Diet:  Full liquids ID:  9 days of Zosyn, completed 12/20/15 DVT:  Heparin   Plan:  Ask Case management to see and begin plans for home care with tube feedings, at home.  Continue Reglan - today is day 7.  Discuss home care with Dr. Barry Dienes. Will check with Dietician on TF rates. Check labs and prealbumin to help with the home tube feeding regime.  LOS: 10 days    Miguel [name] 12/22/2015 [phone]

## 2015-12-23 LAB — CBC
HCT: 27.9 % — ABNORMAL LOW (ref 39.0–52.0)
HEMOGLOBIN: 8.6 g/dL — AB (ref 13.0–17.0)
MCH: 27.6 pg (ref 26.0–34.0)
MCHC: 30.8 g/dL (ref 30.0–36.0)
MCV: 89.4 fL (ref 78.0–100.0)
PLATELETS: 309 10*3/uL (ref 150–400)
RBC: 3.12 MIL/uL — AB (ref 4.22–5.81)
RDW: 18.4 % — ABNORMAL HIGH (ref 11.5–15.5)
WBC: 7.2 10*3/uL (ref 4.0–10.5)

## 2015-12-23 LAB — GLUCOSE, CAPILLARY
GLUCOSE-CAPILLARY: 109 mg/dL — AB (ref 65–99)
GLUCOSE-CAPILLARY: 128 mg/dL — AB (ref 65–99)
Glucose-Capillary: 121 mg/dL — ABNORMAL HIGH (ref 65–99)

## 2015-12-23 LAB — PREALBUMIN: PREALBUMIN: 15.6 mg/dL — AB (ref 18–38)

## 2015-12-23 NOTE — Progress Notes (Signed)
Patient ID: Miguel [name], male   DOB: 1950/07/21, 65 y.o.   MRN: [id number]  [facility] Surgery Progress Note     Subjective: Tube feed at 55 ml/hr over night, reports better tolerance. Still had some indigestion, but less bloating and less coming back up when lying down. Continues to report decreased appetite. Only ate jello and ginger ale yesterday. appetite  Objective: Vital signs in last 24 hours: Temp:  [98 F (36.7 C)-98.8 F (37.1 C)] 98 F (36.7 C) (09/26 0452) Pulse Rate:  [84-93] 84 (09/26 0452) Resp:  [17-18] 18 (09/26 0452) BP: (133-143)/(81-86) 142/86 (09/26 0452) SpO2:  [94 %-98 %] 97 % (09/26 0452) Weight:  [150 lb 12.8 oz (68.4 kg)] 150 lb 12.8 oz (68.4 kg) (09/25 2100) Last BM Date:  (colostomy)  Intake/Output from previous day: 09/25 0701 - 09/26 0700 In: 1309.4 [P.O.:600; I.V.:109; NG/GT:600.4] Out: 3875 [Urine:3725; Stool:150] Intake/Output this shift: No intake/output data recorded.  PE: General appearance: alert, cooperative, no acute distress Resp: clear to auscultation bilaterally GI: soft, + BS, Both tube sites look good.  Midline incision healing well and almost completely healed except for small area and proximal aspect.  Extremities: + 2-3 edema both lower legs, no calf tenderness  Colostomy output 177mL/24hr  Lab Results:   Recent Labs  12/23/15 0435  WBC 7.2  HGB 8.6*  HCT 27.9*  PLT 309   BMET  Recent Labs  12/22/15 0537 12/22/15 1234  NA 138 140  K 3.5 3.8  CL 101 102  CO2 30 31  GLUCOSE 100* 133*  BUN <5* <5*  CREATININE 0.58* 0.66  CALCIUM 7.8* 8.2*   PT/INR No results for input(s): LABPROT, INR in the last 72 hours. CMP     Component Value Date/Time   NA 140 12/22/2015 1234   K 3.8 12/22/2015 1234   CL 102 12/22/2015 1234   CO2 31 12/22/2015 1234   GLUCOSE 133 (H) 12/22/2015 1234   BUN <5 (L) 12/22/2015 1234   CREATININE 0.66 12/22/2015 1234   CALCIUM 8.2 (L) 12/22/2015 1234   PROT 5.8 (L)  12/22/2015 1234   ALBUMIN 2.2 (L) 12/22/2015 1234   AST 21 12/22/2015 1234   ALT 21 12/22/2015 1234   ALKPHOS 105 12/22/2015 1234   BILITOT 0.6 12/22/2015 1234   GFRNONAA >60 12/22/2015 1234   GFRAA >60 12/22/2015 1234   Lipase     Component Value Date/Time   LIPASE 44 11/13/2015 2300       Studies/Results: No results found.  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 [name] S/p Repair of perforated ulcer, resection of distal stomach With [facility] reconstr.-10/30/15 Dr. Hulen Skains S/p open reduction of hiatal hernia, repair of hiatal hernia, resection of small bowel with, anastomosis placement of gastrostomy tube, placement of duodenostomy tube 11/21/15 Dr. Kieth Brightly. Hospitalized 8/17-9/08/17 -prealbumin 15.6  FEN: Tube Feeding (goal 65 cc/hr- current rate 55 ml/hr) Diet:  Full liquids ID:  9 days of  Zosyn, completed 12/20/15 DVT:  Heparin   Plan: Seen by case management yesterday, arranged home Tube feeding and feeding pump.  Working with dietician on TF rates, up to 55cc/hr with improved tolerance and goal of 65.    LOS: 11 days    Jerrye Beavers , [facility] Surgery 12/23/2015, 7:58 AM Pager: [phone] Consults: [phone] Mon-Fri 7:00 am-4:30 pm Sat-Sun 7:00 am-11:30 am

## 2015-12-24 ENCOUNTER — Other Ambulatory Visit: Payer: Self-pay | Admitting: *Deleted

## 2015-12-24 LAB — GLUCOSE, CAPILLARY
GLUCOSE-CAPILLARY: 106 mg/dL — AB (ref 65–99)
GLUCOSE-CAPILLARY: 126 mg/dL — AB (ref 65–99)

## 2015-12-24 MED ORDER — METOCLOPRAMIDE HCL 5 MG/5ML PO SOLN: 5.0000 mg | Freq: Three times a day (TID) | ORAL | 0 refills | Status: DC

## 2015-12-24 MED ORDER — OXYCODONE HCL 5 MG PO TABS: 5.0000 mg | ORAL_TABLET | ORAL | 0 refills | Status: DC | PRN

## 2015-12-24 NOTE — Care Management Note (Signed)
Case Management Note  Patient Details  Name: Miguel [name] MRN: [id number] Date of Birth: 1950/06/25  Subjective/Objective:                    Action/Plan:   Expected Discharge Date:                  Expected Discharge Plan:  [place]  In-House Referral:     Discharge planning Services  CM Consult  Post Acute Care Choice:  Durable Medical Equipment, Home Health Choice offered to:  Patient  DME Arranged:  Tube feeding, Tube feeding pump DME Agency:  [place]:  RN [facility] Agency:  [place]  Status of Service:  Completed, signed off  If discussed at [place] of Stay Meetings, dates discussed:    Additional Comments:  Marilu Favre, RN 12/24/2015, 10:08 AM

## 2015-12-24 NOTE — Progress Notes (Signed)
Patient ID: Miguel [name], male   DOB: 01-22-1951, 65 y.o.   MRN: [id number]  [facility] Surgery Progress Note     Subjective: Feeling well this morning. Improved tolerance with tube feeding over night, although still complains of mild indigestion. Poor PO intake. Eating jello and ginger ale but that's about it. States that he feels like he may be getting some of his appetite back, but not quite yet.  Objective: Vital signs in last 24 hours: Temp:  [98 F (36.7 C)-98.7 F (37.1 C)] 98 F (36.7 C) (09/27 0606) Pulse Rate:  [85-100] 85 (09/27 0606) Resp:  [17-18] 18 (09/27 0606) BP: (128-132)/(75-82) 128/78 (09/27 0606) SpO2:  [95 %-97 %] 95 % (09/27 0606) Weight:  [146 lb 8 oz (66.5 kg)] 146 lb 8 oz (66.5 kg) (09/26 2107) Last BM Date:  (colostomy)  Intake/Output from previous day: 09/26 0701 - 09/27 0700 In: 2554.1 [P.O.:840; I.V.:239.2; NG/GT:1474.9] Out: 2950 [Urine:2950] Intake/Output this shift: No intake/output data recorded.  PE: General appearance: alert, cooperative, no acute distress Resp: clear to auscultation bilaterally GI: soft, + BS, Both tube sites look good. Midline incision healing well and almost completely healed except for small area and proximal aspect.  Extremities: + 2-3 edema both lower legs, no calf tenderness  Colostomy output 78mL/24hr  Lab Results:   Recent Labs  12/23/15 0435  WBC 7.2  HGB 8.6*  HCT 27.9*  PLT 309   BMET  Recent Labs  12/22/15 0537 12/22/15 1234  NA 138 140  K 3.5 3.8  CL 101 102  CO2 30 31  GLUCOSE 100* 133*  BUN <5* <5*  CREATININE 0.58* 0.66  CALCIUM 7.8* 8.2*   PT/INR No results for input(s): LABPROT, INR in the last 72 hours. CMP     Component Value Date/Time   NA 140 12/22/2015 1234   K 3.8 12/22/2015 1234   CL 102 12/22/2015 1234   CO2 31 12/22/2015 1234   GLUCOSE 133 (H) 12/22/2015 1234   BUN <5 (L) 12/22/2015 1234   CREATININE 0.66 12/22/2015 1234   CALCIUM 8.2 (L) 12/22/2015 1234    PROT 5.8 (L) 12/22/2015 1234   ALBUMIN 2.2 (L) 12/22/2015 1234   AST 21 12/22/2015 1234   ALT 21 12/22/2015 1234   ALKPHOS 105 12/22/2015 1234   BILITOT 0.6 12/22/2015 1234   GFRNONAA >60 12/22/2015 1234   GFRAA >60 12/22/2015 1234   Lipase     Component Value Date/Time   LIPASE 44 11/13/2015 2300       Studies/Results: No results found.  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 [name] S/p Repair of perforated ulcer, resection of distal stomach With [facility] reconstr.-10/30/15 Dr. Hulen Skains S/p open reduction of hiatal hernia, repair of hiatal hernia, resection of small bowel with, anastomosis placement of gastrostomy tube, placement of duodenostomy tube 11/21/15 Dr. Kieth Brightly. Hospitalized 8/17-9/08/17 -prealbumin 15.6  FEN: Tube Feeding (goal 65 cc/hr- current rate 55 ml/hr). Diet: Full  liquids ID: 9 days of Zosyn, completed 12/20/15 DVT: Heparin  Plan: Patient spoke with daughter today via telephone and they decided that he is ready to go home. Discharging today on tube feeding, and follow-up with Dr. Ninfa Linden. Nutrition recommends: -Initiate Osmolite 1.5 @ 55 ml/hr via PEG over 18 hour period (ex.1600-1000) -Recommend 75 ml free water flush 4 times daily -Tube feeding regimen provides 1485 kcal (83% of needs), 62 grams of protein, and 754 ml of H2O (1054 ml fluid with addition of free water regimen).     LOS: 12 days    Jerrye Beavers , [facility] Surgery 12/24/2015, 7:40 AM Pager: [phone] Consults: [phone] Mon-Fri 7:00 am-4:30 pm Sat-Sun 7:00 am-11:30 am

## 2015-12-24 NOTE — Discharge Instructions (Signed)
Full Liquid Diet A full liquid diet may be used:   To help you transition from a clear liquid diet to a soft diet.   When your body is healing and can only tolerate foods that are easy to digest.  Before or after certain a procedure, test, or surgery (such as stomach or intestinal surgeries).   If you have trouble swallowing or chewing.  A full liquid diet includes fluids and foods that are liquid or will become liquid at room temperature. The full liquid diet gives you the proteins, fluids, salts, and minerals that you need for energy. If you continue this diet for more than 72 hours, talk to your health care provider about how many calories you need to consume. If you continue the diet for more than 5 days, talk to your health care provider about taking a multivitamin or a nutritional supplement. WHAT DO I NEED TO KNOW ABOUT A FULL LIQUID DIET?  You may have any liquid.  You may have any food that becomes a liquid at room temperature. The food is considered a liquid if it can be poured off a spoon at room temperature.  Drink one serving of citrus or vitamin C-enriched fruit juice daily. WHAT FOODS CAN I EAT? Grains Any grain food that can be pureed in soup (such as crackers, pasta, and rice). Hot cereal (such as farina or oatmeal) that has been blended. Talk to your health care provider or dietitian about these foods. Vegetables Pulp-free tomato or vegetable juice. Vegetables pureed in soup.  Fruits Fruit juice, including nectars and juices with pulp. Meats and Other Protein Sources Eggs in custard, eggnog mix, and eggs used in ice cream or pudding. Strained meats, like in baby food, may be allowed. Consult your health care provider.  Dairy Milk and milk-based beverages, including milk shakes and instant breakfast mixes. Smooth yogurt. Pureed cottage cheese. Avoid these foods if they are not well tolerated. Beverages All beverages, including liquid nutritional supplements. Ask  your health care provider if you can have carbonated beverages. They may not be well tolerated. Condiments Iodized salt, pepper, spices, and flavorings. Cocoa powder. Vinegar, ketchup, yellow mustard, smooth sauces (such as hollandaise, cheese sauce, or white sauce), and soy sauce. Sweets and Desserts Custard, smooth pudding. Flavored gelatin. Tapioca, junket. Plain ice cream, sherbet, fruit ices. Frozen ice pops, frozen fudge pops, pudding pops, and other frozen bars with cream. Syrups, including chocolate syrup. Sugar, honey, jelly.  Fats and Oils Margarine, butter, cream, sour cream, and oils. Other Broth and cream soups. Strained, broth-based soups. The items listed above may not be a complete list of recommended foods or beverages. Contact your dietitian for more options.  WHAT FOODS CAN I NOT EAT? Grains All breads. Grains are not allowed unless they are pureed into soup. Vegetables Vegetables are not allowed unless they are juiced, or cooked and pureed into soup. Fruits Fruits are not allowed unless they are juiced. Meats and Other Protein Sources Any meat or fish. Cooked or raw eggs. Nut butters.  Dairy Cheese.  Condiments Stone ground mustards. Fats and Oils Fats that are coarse or chunky. Sweets and Desserts Ice cream or other frozen desserts that have any solids in them or on top, such as nuts, chocolate chips, and pieces of cookies. Cakes. Cookies. Candy. Others Soups with chunks or pieces in them. The items listed above may not be a complete list of foods and beverages to avoid. Contact your dietitian for more information.   This information   is not intended to replace advice given to you by your health care provider. Make sure you discuss any questions you have with your health care provider.   Document Released: 03/15/2005 Document Revised: 03/20/2013 Document Reviewed: 01/18/2013 Elsevier Interactive Patient Education 2016 Else[employer] Inc.  

## 2015-12-24 NOTE — Consult Note (Addendum)
   [name] Inpatient Consult   12/24/2015  Miguel [name] [birth year] [id number]    [facility] Care Management/Link to Wellness follow up. Came to bedside to speak with [name] prior to discharge. Made him aware that he will receive follow up telephonically post discharge. Spoke with inpatient RNCM who states [name] will have nocturnal tube feeds at home this time. Will update Telephonic [facility] RNCM of [name] discharge today. He will have home health RN thru [place] as well.    [name], MSN-Ed, RN,BSN [facility] Liaison [phone]

## 2015-12-24 NOTE — Progress Notes (Signed)
Discharge home. Home discharge instruction given, no question verbalized. Daughter educated about care of peg tube, drain and colostomy .

## 2015-12-24 NOTE — Patient Outreach (Addendum)
[name]) Care Management  12/24/2015  Miguel [name] 01-May-1950 [id number]   Subjective: Patient's case discussed in difficult case discussion on 12/23/15.    Patient currently inpatient for dehydration and malnutrition.    Patient was admitted on 12/12/15.  Has been followed by Telephonic RNCM for transition of care follow up after previous 2 hospital admission. Most recent transition of care follow up call completed on 12/10/15.   Patient has a supportive family and discharge plan for home with possible home health if needed.    Patient will continue to receive [facility] Care Management UMR Transition of Care follow up.   Objective: Per chart review: Patient hospitalized on 12/12/15 for dehydration, malnutrition, and remains inpatient.  Patient hospitalized 11/13/15 -12/05/15 with Postoperative fever, acute kidney injury, severe protein malnutrition, and sepsis. Status post Open reduction of hiatal hernia repair of hiatal hernia, resection of small bowel with anastomosis,placement of gastrostomy tube, placement of duodenostomy tube on 11/21/15.  Patient hospitalized 10/27/15 - 11/09/15 for GI bleed, hiatal hernia, acute blood loss anemia, and gastric ulcer. Patient has a history of diverticulosis with perforation, hyperlipidemia, and chronic kidney disease. Patient status post REPAIR OF BLEEDING ULCER PARTIAL DISTAL GASTRECTOMY WITH BILROTH II RECONSTRUCTION/GASTROJEJUNOSTOMY on 10/30/15. Ostomy reversal pending.   Assessment: Received UMR Transition of care referral on 12/15/15. Transition of care screening / follow up, pending patient's hospital discharge.   Plan: [facility] Liaison will continue to follow up while patient in hospital and Telephonic RNCM will follow up post discharge for transition of care follow up calls within 3 business of hospital discharge.    Krystofer Hevener H. Annia Friendly, BSN, [place] [facility] Telephonic CM Phone:  [phone] Fax: [phone]

## 2015-12-25 ENCOUNTER — Emergency Department (HOSPITAL_COMMUNITY): Payer: 59

## 2015-12-25 ENCOUNTER — Inpatient Hospital Stay (HOSPITAL_COMMUNITY)
Admission: EM | Admit: 2015-12-25 | Discharge: 2016-01-03 | DRG: 864 | Disposition: A | Discharge status: Home Health | Payer: 59 | Attending: Neurosurgery | Admitting: Neurosurgery

## 2015-12-25 ENCOUNTER — Inpatient Hospital Stay (HOSPITAL_COMMUNITY): Payer: 59

## 2015-12-25 ENCOUNTER — Encounter (HOSPITAL_COMMUNITY): Payer: Self-pay | Admitting: Emergency Medicine

## 2015-12-25 DIAGNOSIS — Z931 Gastrostomy status: Secondary | ICD-10-CM | POA: Diagnosis not present

## 2015-12-25 DIAGNOSIS — N200 Calculus of kidney: Secondary | ICD-10-CM | POA: Diagnosis not present

## 2015-12-25 DIAGNOSIS — R109 Unspecified abdominal pain: Secondary | ICD-10-CM | POA: Diagnosis present

## 2015-12-25 DIAGNOSIS — I10 Essential (primary) hypertension: Secondary | ICD-10-CM | POA: Diagnosis present

## 2015-12-25 DIAGNOSIS — J90 Pleural effusion, not elsewhere classified: Secondary | ICD-10-CM | POA: Diagnosis not present

## 2015-12-25 DIAGNOSIS — Z903 Acquired absence of stomach [part of]: Secondary | ICD-10-CM | POA: Diagnosis not present

## 2015-12-25 DIAGNOSIS — R6510 Systemic inflammatory response syndrome (SIRS) of non-infectious origin without acute organ dysfunction: Secondary | ICD-10-CM | POA: Diagnosis present

## 2015-12-25 DIAGNOSIS — T814XXA Infection following a procedure, initial encounter: Secondary | ICD-10-CM | POA: Diagnosis present

## 2015-12-25 DIAGNOSIS — Z8711 Personal history of peptic ulcer disease: Secondary | ICD-10-CM

## 2015-12-25 DIAGNOSIS — Z85820 Personal history of malignant melanoma of skin: Secondary | ICD-10-CM

## 2015-12-25 DIAGNOSIS — J9811 Atelectasis: Secondary | ICD-10-CM | POA: Diagnosis present

## 2015-12-25 DIAGNOSIS — R509 Fever, unspecified: Principal | ICD-10-CM | POA: Diagnosis present

## 2015-12-25 DIAGNOSIS — D72829 Elevated white blood cell count, unspecified: Secondary | ICD-10-CM | POA: Diagnosis present

## 2015-12-25 DIAGNOSIS — E785 Hyperlipidemia, unspecified: Secondary | ICD-10-CM | POA: Diagnosis present

## 2015-12-25 DIAGNOSIS — E860 Dehydration: Secondary | ICD-10-CM | POA: Diagnosis present

## 2015-12-25 DIAGNOSIS — J948 Other specified pleural conditions: Secondary | ICD-10-CM | POA: Diagnosis not present

## 2015-12-25 DIAGNOSIS — Z9889 Other specified postprocedural states: Secondary | ICD-10-CM

## 2015-12-25 DIAGNOSIS — Z87891 Personal history of nicotine dependence: Secondary | ICD-10-CM

## 2015-12-25 DIAGNOSIS — L02211 Cutaneous abscess of abdominal wall: Secondary | ICD-10-CM | POA: Diagnosis present

## 2015-12-25 DIAGNOSIS — A419 Sepsis, unspecified organism: Secondary | ICD-10-CM | POA: Diagnosis present

## 2015-12-25 DIAGNOSIS — Z933 Colostomy status: Secondary | ICD-10-CM

## 2015-12-25 LAB — COMPREHENSIVE METABOLIC PANEL
ALK PHOS: 108 U/L (ref 38–126)
ALT: 21 U/L (ref 17–63)
AST: 26 U/L (ref 15–41)
Albumin: 2.6 g/dL — ABNORMAL LOW (ref 3.5–5.0)
Anion gap: 9 (ref 5–15)
BILIRUBIN TOTAL: 0.5 mg/dL (ref 0.3–1.2)
BUN: 10 mg/dL (ref 6–20)
CALCIUM: 8.5 mg/dL — AB (ref 8.9–10.3)
CO2: 25 mmol/L (ref 22–32)
CREATININE: 0.81 mg/dL (ref 0.61–1.24)
Chloride: 98 mmol/L — ABNORMAL LOW (ref 101–111)
GFR calc Af Amer: >60 mL/min (ref >60)
GLUCOSE: 170 mg/dL — AB (ref 65–99)
POTASSIUM: 4.3 mmol/L (ref 3.5–5.1)
Sodium: 132 mmol/L — ABNORMAL LOW (ref 135–145)
TOTAL PROTEIN: 6.6 g/dL (ref 6.5–8.1)

## 2015-12-25 LAB — CBC WITH DIFFERENTIAL/PLATELET
BASOS ABS: 0 10*3/uL (ref 0.0–0.1)
Basophils Relative: 0 %
Eosinophils Absolute: 0 10*3/uL (ref 0.0–0.7)
Eosinophils Relative: 0 %
HEMATOCRIT: 33.6 % — AB (ref 39.0–52.0)
HEMOGLOBIN: 10.7 g/dL — AB (ref 13.0–17.0)
LYMPHS PCT: 9 %
Lymphs Abs: 1.7 10*3/uL (ref 0.7–4.0)
MCH: 28.2 pg (ref 26.0–34.0)
MCHC: 31.8 g/dL (ref 30.0–36.0)
MCV: 88.7 fL (ref 78.0–100.0)
MONO ABS: 1.2 10*3/uL — AB (ref 0.1–1.0)
MONOS PCT: 7 %
NEUTROS ABS: 15.3 10*3/uL — AB (ref 1.7–7.7)
NEUTROS PCT: 84 %
Platelets: 419 10*3/uL — ABNORMAL HIGH (ref 150–400)
RBC: 3.79 MIL/uL — ABNORMAL LOW (ref 4.22–5.81)
RDW: 18.8 % — AB (ref 11.5–15.5)
WBC: 18.1 10*3/uL — ABNORMAL HIGH (ref 4.0–10.5)

## 2015-12-25 LAB — URINALYSIS, ROUTINE W REFLEX MICROSCOPIC
Bilirubin Urine: NEGATIVE
GLUCOSE, UA: NEGATIVE mg/dL
Hgb urine dipstick: NEGATIVE
Ketones, ur: NEGATIVE mg/dL
LEUKOCYTES UA: NEGATIVE
Nitrite: NEGATIVE
PH: 8.5 — AB (ref 5.0–8.0)
Protein, ur: NEGATIVE mg/dL
Specific Gravity, Urine: 1.014 (ref 1.005–1.030)

## 2015-12-25 LAB — I-STAT CG4 LACTIC ACID, ED: LACTIC ACID, VENOUS: 1.65 mmol/L (ref 0.5–1.9)

## 2015-12-25 MED ORDER — PROMETHAZINE HCL 25 MG PO TABS: 25.0000 mg | ORAL_TABLET | Freq: Four times a day (QID) | ORAL | Status: DC | PRN

## 2015-12-25 MED ORDER — VANCOMYCIN HCL 10 G IV SOLR: 20.0000 mg/kg | Freq: Once | INTRAVENOUS | Status: DC

## 2015-12-25 MED ORDER — ENOXAPARIN SODIUM 40 MG/0.4ML ~~LOC~~ SOLN
40.0000 mg | SUBCUTANEOUS | Status: DC
  Administered 2015-12-25 – 2016-01-02 (×9): 40 mg via SUBCUTANEOUS
  Filled 2015-12-25 (×9): qty 0.4

## 2015-12-25 MED ORDER — ONDANSETRON 4 MG PO TBDP: 4.0000 mg | ORAL_TABLET | Freq: Four times a day (QID) | ORAL | Status: DC | PRN

## 2015-12-25 MED ORDER — PIPERACILLIN-TAZOBACTAM 3.375 G IVPB
3.3750 g | Freq: Three times a day (TID) | INTRAVENOUS | Status: DC
  Administered 2015-12-25 – 2016-01-01 (×20): 3.375 g via INTRAVENOUS
  Filled 2015-12-25 (×22): qty 50

## 2015-12-25 MED ORDER — ONDANSETRON HCL 4 MG PO TABS: 4.0000 mg | ORAL_TABLET | Freq: Three times a day (TID) | ORAL | Status: DC | PRN

## 2015-12-25 MED ORDER — OXYCODONE HCL 5 MG PO TABS
5.0000 mg | ORAL_TABLET | ORAL | Status: DC | PRN
  Administered 2016-01-01 – 2016-01-02 (×3): 10 mg via ORAL
  Filled 2015-12-25 (×5): qty 2

## 2015-12-25 MED ORDER — PIPERACILLIN-TAZOBACTAM 4.5 G IVPB: 4.5000 g | Freq: Once | INTRAVENOUS | Status: DC

## 2015-12-25 MED ORDER — ACETAMINOPHEN 500 MG PO TABS
500.0000 mg | ORAL_TABLET | Freq: Three times a day (TID) | ORAL | Status: DC | PRN
  Administered 2015-12-26 – 2015-12-30 (×2): 500 mg via ORAL
  Filled 2015-12-25 (×3): qty 1

## 2015-12-25 MED ORDER — FAMOTIDINE 20 MG PO TABS
20.0000 mg | ORAL_TABLET | Freq: Two times a day (BID) | ORAL | Status: DC
  Administered 2015-12-25 – 2016-01-03 (×18): 20 mg via ORAL
  Filled 2015-12-25 (×18): qty 1

## 2015-12-25 MED ORDER — HYDROMORPHONE HCL 1 MG/ML IJ SOLN
0.5000 mg | INTRAMUSCULAR | Status: DC | PRN
  Administered 2015-12-25 – 2016-01-02 (×49): 1 mg via INTRAVENOUS
  Filled 2015-12-25 (×49): qty 1

## 2015-12-25 MED ORDER — IOPAMIDOL (ISOVUE-300) INJECTION 61%
INTRAVENOUS | Status: AC
Start: 2015-12-25 — End: 2015-12-25
  Administered 2015-12-25: 100 mL
  Filled 2015-12-25: qty 100

## 2015-12-25 MED ORDER — KCL IN DEXTROSE-NACL 20-5-0.45 MEQ/L-%-% IV SOLN
INTRAVENOUS | Status: DC
  Administered 2015-12-25 – 2016-01-02 (×18): via INTRAVENOUS
  Filled 2015-12-25 (×20): qty 1000

## 2015-12-25 MED ORDER — SODIUM CHLORIDE 0.9 % IV BOLUS (SEPSIS)
30.0000 mL/kg | Freq: Once | INTRAVENOUS | Status: AC
  Administered 2015-12-25: 1824 mL via INTRAVENOUS

## 2015-12-25 MED ORDER — FERROUS SULFATE 325 (65 FE) MG PO TABS
325.0000 mg | ORAL_TABLET | Freq: Every day | ORAL | Status: DC
  Administered 2015-12-26 – 2016-01-03 (×9): 325 mg via ORAL
  Filled 2015-12-25 (×9): qty 1

## 2015-12-25 MED ORDER — ADULT MULTIVITAMIN W/MINERALS CH
1.0000 | ORAL_TABLET | Freq: Every day | ORAL | Status: DC
  Administered 2015-12-26 – 2016-01-03 (×9): 1 via ORAL
  Filled 2015-12-25 (×9): qty 1

## 2015-12-25 MED ORDER — ONDANSETRON HCL 4 MG/2ML IJ SOLN
4.0000 mg | Freq: Four times a day (QID) | INTRAMUSCULAR | Status: DC | PRN
  Administered 2015-12-26: 4 mg via INTRAVENOUS
  Filled 2015-12-25: qty 2

## 2015-12-25 MED ORDER — ASPIRIN EC 81 MG PO TBEC
81.0000 mg | DELAYED_RELEASE_TABLET | Freq: Every day | ORAL | Status: DC
  Administered 2015-12-26 – 2016-01-02 (×8): 81 mg via ORAL
  Filled 2015-12-25 (×8): qty 1

## 2015-12-25 MED ORDER — METOCLOPRAMIDE HCL 5 MG/5ML PO SOLN
5.0000 mg | Freq: Three times a day (TID) | ORAL | Status: DC
  Administered 2015-12-26 – 2015-12-27 (×4): 5 mg
  Filled 2015-12-25 (×4): qty 10

## 2015-12-25 NOTE — Discharge Summary (Signed)
[facility] Discharge Summary   Patient ID: Miguel [name] MRN: [id number] DOB/AGE: 1950/12/13 65 y.o.  Admit date: 12/12/2015 Discharge date: 12/25/2015  Admitting Diagnosis: Generalized weakness AKI Abdominal abscess Syncope Hyperglycemia Severe protein calorie malnutrition Delayed gastric emptying Decreased oral intake  Discharge Diagnosis Patient Active Problem List   Diagnosis Date Noted  . Postoperative fever 11/19/2015  . 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 Severe protein-calorie malnutrition ([place]) 11/17/2015  . Sepsis ([place]) 11/14/2015  . Hiccups 11/14/2015  . AKI (acute kidney injury) ([place]) 11/14/2015  . Fever   . Leg swelling   . Left shoulder pain   . Muscle spasm of left shoulder   . GI bleed 10/27/2015  . Acute blood loss anemia 10/27/2015  . Syncope 10/27/2015  . Hyperglycemia 10/27/2015  . Hypotension 10/27/2015  . Neck pain 10/27/2015  . Hematemesis 10/27/2015  . Hematochezia 10/27/2015  . Diverticulitis of colon with perforation 07/05/2015    Consultants None  Imaging: CT abdomen pelvis w contrast 12/15/15: Mild left pleural effusion with adjacent left lower lobe atelectasis. Stable mild prostatic enlargement. Aortic atherosclerosis. Bilateral nonobstructive nephrolithiasis. Status post hiatal hernia repair. Gastrostomy and duodenal feeding tube in grossly good position. Stable ostomy in left lower quadrant with small peristomal hernia. Surgical drain is seen entering right lower quadrant with distal tip near gastric cardia.  DG UGI w/water sol cm 12/17/15: Normal emptying of the stomach with gastrojejunostomy. No leak or obstruction.  Procedures None this admission S/p sigmoid diverticulitis with perforation status post sigmoid colectomy and colostomy 07/05/15 Dr. [name] S/p Repair of perforated ulcer, resection of distal stomach With [facility] reconstr.-10/30/15 Dr. Hulen Skains S/p open reduction of hiatal hernia, repair of  hiatal hernia, resection of small bowel with, anastomosis placement of gastrostomy tube, placement of duodenostomy tube 11/21/15 Dr. Kieth Brightly. Hospitalized 8/17-9/08/17  Hospital Course:  Miguel [name] is a 65yo male who presented to [facility] 12/12/15 with nausea, fatigue, decreased oral intake, and weight loss. He underwent open reduction of hiatal hernia with small bowel resection and repair of hiatal hernia for acutely incarcerated hernia leading to perforation of small intestine 11/21/15. Postoperatively he did fairly well as able tolerate food and eventually was discharged 12/05/15 without G-tube feeds. Since then he has had increasing amounts of nausea, no emesis, not controlled with typical oral medications. He's had significant weight loss, poor appetite as well as fatigue. Admitted for dehydration as well as severe protein calorie malnutrition, significant weight loss since surgery, slight leukocytosis, and acute kidney injury. Patient was started on IVF resuscitation, trickle tube feeds, and empiric antibiotics for leukocytosis. Unsure of pathology leading to malnutrition on admission, but it was decided to postpone CT until creatinine normalized. Patient slowly improved over time, duodenostomy tube clamped on 12/13/15. Creatinine normalized and CT was performed on 9/18 which showed good repair of hiatal hernia, G and D tubes in good position, no problems with LLQ ostomy and no other concerning features to explain malnutrition. He experienced reflux and heartburn during nightly tube feeds so the rate was decreased from 73mL/hr to 14mL/hr and started on Reglan which helped. UGI showed good emptying into GJ with no leak. Diet was slowly advanced but patient continued to complain of decreased appetite. Nutrition was consulted and helped titrate tube feed rates as well as maximize PO caloric intake. On 12/24/15 patient was tolerating tube feeding at rate of 33mL/hr over 18 hour period and his nutritional status was  improving. He was discharged on tube feedings and a soft diet.  Patient will follow-up with Dr. Ninfa Linden in about 2 weeks.  Physical Exam: General appearance: alert, cooperative, no acute distress Resp: clear to auscultation bilaterally GI: soft, + BS, Both tube sites look good. Midline incision healing well and almost completely healed except for small area and proximal aspect.  Extremities: + 2-3 edema both lower legs, no calf tenderness    Medication List    STOP taking these medications   HYDROcodone-acetaminophen 5-325 MG tablet Commonly known as:  NORCO/VICODIN   methocarbamol 750 MG tablet Commonly known as:  ROBAXIN     TAKE these medications   acetaminophen 500 MG tablet Commonly known as:  TYLENOL Take 1 tablet (500 mg total) by mouth every 8 (eight) hours as needed for mild pain (for pain).   aspirin EC 81 MG tablet Take 81 mg by mouth daily after supper.   famotidine 20 MG tablet Commonly known as:  PEPCID Take 1 tablet (20 mg total) by mouth 2 (two) times daily.   ferrous sulfate 325 (65 FE) MG tablet Take 325 mg by mouth daily.   Fish Oil 1000 MG Cpdr Take 1,000 mg by mouth daily.   metoCLOPramide 5 MG/5ML solution Commonly known as:  REGLAN Place 5 mLs (5 mg total) into feeding tube 3 (three) times daily before meals.   multivitamin with minerals Tabs tablet Take 1 tablet by mouth daily.   ondansetron 4 MG tablet Commonly known as:  ZOFRAN Take 4 mg by mouth every 8 (eight) hours as needed for nausea or vomiting.   oxyCODONE 5 MG immediate release tablet Commonly known as:  Oxy IR/ROXICODONE Take 1-2 tablets (5-10 mg total) by mouth every 4 (four) hours as needed for moderate pain, severe pain or breakthrough pain.   promethazine 25 MG tablet Commonly known as:  PHENERGAN Take 25 mg by mouth every 6 (six) hours as needed for nausea or vomiting.   zolpidem 10 MG tablet Commonly known as:  AMBIEN Take 5 mg by mouth See admin instructions. Take  1/2 tablet (5 mg) by mouth daily at bedtime, may also take another 1/2 tablet if needed during the night        Follow-up Information    Harl Bowie, MD .   Specialty:  General Surgery Why:  Your appointment is 01/09/2016 at 10:40am. Please arrive 15 minutes early to fill out necessary paperwork. Contact information: [address] [place] [address] [phone]           Signed: Jerrye Beavers, [facility] Surgery 12/25/2015, 11:22 AM Pager: [phone] Consults: [phone] Mon-Fri 7:00 am-4:30 pm Sat-Sun 7:00 am-11:30 am

## 2015-12-25 NOTE — ED Notes (Signed)
IV team at bedside 

## 2015-12-25 NOTE — Progress Notes (Addendum)
Pharmacy Antibiotic Note  Miguel [name] is a 66 y.o. male admitted on 12/25/2015 with fever and abdominal pain. History of multiple intra-abdominal surgeries with recent infection. Just discharged from hospital 9/27 - on Zosyn 9/16-9/23. Pharmacy has been consulted for Zosyn dosing.  Plan: Zosyn 3.375g IV q8h (4 hour infusion).  Monitor renal function, clinical picture, and culture results   Height: 5\' 5"  (165.1 cm) Weight: 134 lb (60.8 kg) IBW/kg (Calculated) : 61.5  Temp (24hrs), Avg:100 F (37.8 C), Min:100 F (37.8 C), Max:100 F (37.8 C)   Recent Labs Lab 12/22/15 0537 12/22/15 1234 12/23/15 0435 12/25/15 1736  WBC  --   --  7.2 18.1*  CREATININE 0.58* 0.66  --  0.81    Estimated Creatinine Clearance: 78.2 mL/min (by C-G formula based on SCr of 0.81 mg/dL).    No Active Allergies  Antimicrobials this admission: 9/28  Zosyn >>   Dose adjustments this admission: N/A   Microbiology results: pending   Thank you for allowing pharmacy to be a part of this patient's care.  Argie Ramming, PharmD Pharmacy Resident  Pager [phone] 12/25/15 7:41 PM

## 2015-12-25 NOTE — ED Notes (Signed)
Attempted report x1. 

## 2015-12-25 NOTE — ED Triage Notes (Addendum)
Pt to ED via [facility] with c/o fever today.  St's he was discharged from [place] yesterday after having feeding tube place.  Pt st's he has been having urinary frequency with small amounts.  Last tylenol approx 1 hour ago

## 2015-12-25 NOTE — ED Notes (Signed)
Patient transported to CT 

## 2015-12-25 NOTE — ED Notes (Signed)
Delay in IV fluids due to difficult IV start

## 2015-12-25 NOTE — ED Notes (Signed)
Iv attempted x 2 by Lexine Baton, RN and x 1 by [employer]

## 2015-12-25 NOTE — ED Notes (Signed)
Dr Wyatt at bedside.  

## 2015-12-25 NOTE — H&P (Signed)
Miguel [name] is an 65 y.o. male.   Chief Complaint: Abdominal pain and fever HPI: Patient well-known to our service with previous gastrectomy with BII, Duodenal blowout, Abdominal abscesses, previous colectomy with a colostomy, now here with fevers at home of 102, difficulty urinating and abdominal pain, but no nausea or vomiting.  Past Medical History:  Diagnosis Date  . Anemia   . Anxiety   . Gastritis   . GI bleed due to NSAIDs 10/27/2015  . Hyperlipidemia   . Hypertension   . Kidney stones   . 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 Sigmoid diverticulitis    with perforation    Past Surgical History:  Procedure Laterality Date  . COLON SURGERY    . ESOPHAGOGASTRODUODENOSCOPY N/A 10/27/2015   Procedure: ESOPHAGOGASTRODUODENOSCOPY (EGD);  Surgeon: Clarene Essex, MD;  Location: [facility] ENDOSCOPY;  Service: Endoscopy;  Laterality: N/A;  . ESOPHAGOGASTRODUODENOSCOPY N/A 10/29/2015   Procedure: ESOPHAGOGASTRODUODENOSCOPY (EGD);  Surgeon: Ronald Lobo, MD;  Location: [facility] ENDOSCOPY;  Service: Endoscopy;  Laterality: N/A;  . ESOPHAGOGASTRODUODENOSCOPY N/A 10/30/2015   Procedure: ESOPHAGOGASTRODUODENOSCOPY (EGD);  Surgeon: Clarene Essex, MD;  Location: [facility] ENDOSCOPY;  Service: Endoscopy;  Laterality: N/A;  . GASTROSTOMY TUBE PLACEMENT  11/21/2015   REDUCTION OF HIATAL HERNIA , REPAIR HIATAL HERNIA, RESECTION SMALL BOWEL WITH ANASTOMOSIS, PLACEMENT GASTROSTOMY TUBE, PLACEMENT DUODENOSTOMY TUBE (N/A)  . HEMORRHOID BANDING  X 2  . HIATAL HERNIA REPAIR N/A 11/21/2015   Procedure: REDUCTION OF HIATAL HERNIA , REPAIR HIATAL HERNIA, RESECTION SMALL BOWEL WITH ANASTOMOSIS, PLACEMENT GASTROSTOMY TUBE, PLACEMENT DUODENOSTOMY TUBE;  Surgeon: Mickeal Skinner, MD;  Location: [facility];  Service: General;  Laterality: N/A;  . [place]   "opened me up"  . LAPAROTOMY N/A 07/05/2015   Procedure: PARTIAL SIGMOID COLECTOMY AND COLOSTOMY;  Surgeon: [name], MD;  Location: [facility];  Service:  General;  Laterality: N/A;  . MELANOMA EXCISION  2001  . 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  10/30/2015   Procedure: REMOVAL OF DISTAL STOMACH;  Surgeon: Judeth Horn, MD;  Location: [place];  Service: General;;  . REPAIR OF PERFORATED ULCER N/A 10/30/2015   Procedure: REPAIR OF BLEEDING  ULCER;  Surgeon: Judeth Horn, MD;  Location: [place];  Service: General;  Laterality: N/A;  . TUMOR EXCISION  2009   "back; fatty tumor"    Family History  Problem Relation Age of Onset  . Stroke Mother   . Stroke Brother   . Heart disease Brother    Social History:  reports that he has never smoked. He quit smokeless tobacco use about 5 months ago. His smokeless tobacco use included Snuff. He reports that he does not drink alcohol or use drugs.  Allergies: No Active Allergies   (Not in a hospital admission)  Results for orders placed or performed during the hospital encounter of 12/25/15 (from the past 48 hour(s))  CBC with Differential     Status: Abnormal   Collection Time: 12/25/15  5:36 PM  Result Value Ref Range   WBC 18.1 (H) 4.0 - 10.5 K/uL   RBC 3.79 (L) 4.22 - 5.81 MIL/uL   Hemoglobin 10.7 (L) 13.0 - 17.0 g/dL   HCT 33.6 (L) 39.0 - 52.0 %   MCV 88.7 78.0 - 100.0 fL   MCH 28.2 26.0 - 34.0 pg   MCHC 31.8 30.0 - 36.0 g/dL   RDW 18.8 (H) 11.5 - 15.5 %   Platelets 419 (H) 150 - 400  K/uL   Neutrophils Relative % 84 %   Neutro Abs 15.3 (H) 1.7 - 7.7 K/uL   Lymphocytes Relative 9 %   Lymphs Abs 1.7 0.7 - 4.0 K/uL   Monocytes Relative 7 %   Monocytes Absolute 1.2 (H) 0.1 - 1.0 K/uL   Eosinophils Relative 0 %   Eosinophils Absolute 0.0 0.0 - 0.7 K/uL   Basophils Relative 0 %   Basophils Absolute 0.0 0.0 - 0.1 K/uL  Comprehensive metabolic panel     Status: Abnormal   Collection Time: 12/25/15  5:36 PM  Result Value Ref Range   Sodium 132 (L) 135 - 145 mmol/L   Potassium 4.3 3.5 - 5.1 mmol/L   Chloride 98 (L) 101 - 111 mmol/L   CO2 25 22 - 32 mmol/L   Glucose, Bld 170  (H) 65 - 99 mg/dL   BUN 10 6 - 20 mg/dL   Creatinine, Ser 0.81 0.61 - 1.24 mg/dL   Calcium 8.5 (L) 8.9 - 10.3 mg/dL   Total Protein 6.6 6.5 - 8.1 g/dL   Albumin 2.6 (L) 3.5 - 5.0 g/dL   AST 26 15 - 41 U/L   ALT 21 17 - 63 U/L   Alkaline Phosphatase 108 38 - 126 U/L   Total Bilirubin 0.5 0.3 - 1.2 mg/dL   GFR calc non Af Amer >60 >60 mL/min   GFR calc Af Amer >60 >60 mL/min    Comment: (NOTE) The eGFR has been calculated using the CKD EPI equation. This calculation has not been validated in all clinical situations. eGFR's persistently <60 mL/min signify possible Chronic Kidney Disease.    Anion gap 9 5 - 15  Urinalysis, Routine w reflex microscopic (not at [facility])     Status: Abnormal   Collection Time: 12/25/15  5:46 PM  Result Value Ref Range   Color, Urine YELLOW YELLOW   APPearance CLEAR CLEAR   Specific Gravity, Urine 1.014 1.005 - 1.030   pH 8.5 (H) 5.0 - 8.0   Glucose, UA NEGATIVE NEGATIVE mg/dL   Hgb urine dipstick NEGATIVE NEGATIVE   Bilirubin Urine NEGATIVE NEGATIVE   Ketones, ur NEGATIVE NEGATIVE mg/dL   Protein, ur NEGATIVE NEGATIVE mg/dL   Nitrite NEGATIVE NEGATIVE   Leukocytes, UA NEGATIVE NEGATIVE    Comment: MICROSCOPIC NOT DONE ON URINES WITH NEGATIVE PROTEIN, BLOOD, LEUKOCYTES, NITRITE, OR GLUCOSE <1000 mg/dL.   No results found.  Review of Systems  Constitutional: Positive for chills and fever.  HENT: Negative.   Eyes: Negative.   Respiratory: Negative.   Cardiovascular: Negative.   Gastrointestinal: Positive for abdominal pain and nausea. Negative for vomiting.  Genitourinary: Positive for dysuria, frequency and urgency.  Musculoskeletal: Negative.   Skin: Negative.   Neurological: Negative.   Endo/Heme/Allergies: Negative.   Psychiatric/Behavioral: Negative.   All other systems reviewed and are negative.   Blood pressure 125/81, pulse 115, temperature 100 F (37.8 C), temperature source Oral, resp. rate 18, height '5\' 5"'  (1.651 m), weight 60.8 kg  (134 lb), SpO2 97 %. Physical Exam  Constitutional: He appears well-developed.  HENT:  Head: Normocephalic and atraumatic.  Right Ear: External ear normal.  Left Ear: External ear normal.  Eyes: Conjunctivae and EOM are normal. Pupils are equal, round, and reactive to light.  Neck: Normal range of motion. Neck supple.  Cardiovascular: Regular rhythm, normal heart sounds and intact distal pulses.   No murmur heard. Respiratory: Effort normal and breath sounds normal.  GI: Soft. Normal appearance. He exhibits no distension. Bowel  sounds are decreased. There is no tenderness. There is no rigidity, no rebound and no guarding. No hernia.    Musculoskeletal: Normal range of motion.  Skin: Skin is warm, dry and intact. He is not diaphoretic. There is pallor.  Decreased skin turgor  Psychiatric: He has a normal mood and affect. His behavior is normal. Judgment and thought content normal.     Assessment/Plan Abdominal pain with complicated operative history.  CT scan of the abdomen is pending. Fever of unknown etiology.  Blood cultures are pending.  Sepsis protocol initiated.  CT abdomen is pending.  CXR is pending.  Urinalysis is clear for infection.  Will admit even without the CT results for possible intra-abdominal infection.    Zosyn per pharmacy.  Will not start Vancomycin for now.  He is also likely dehydrated based on decreased skin turgor and hemoconcentration.  Will rehydrate.  CT of the abdomen and pelvis basically unchanged from last CT done on 9-18. CXR does not show pneumonia, but has effusions.  Judeth Horn, MD 12/25/2015, 7:26 PM

## 2015-12-26 LAB — COMPREHENSIVE METABOLIC PANEL
ALBUMIN: 2.1 g/dL — AB (ref 3.5–5.0)
ALK PHOS: 87 U/L (ref 38–126)
ALT: 16 U/L — ABNORMAL LOW (ref 17–63)
AST: 17 U/L (ref 15–41)
Anion gap: 4 — ABNORMAL LOW (ref 5–15)
BILIRUBIN TOTAL: 0.8 mg/dL (ref 0.3–1.2)
BUN: 7 mg/dL (ref 6–20)
CALCIUM: 8.1 mg/dL — AB (ref 8.9–10.3)
CO2: 28 mmol/L (ref 22–32)
Chloride: 101 mmol/L (ref 101–111)
Creatinine, Ser: 0.76 mg/dL (ref 0.61–1.24)
GFR calc Af Amer: >60 mL/min (ref >60)
GFR calc non Af Amer: >60 mL/min (ref >60)
GLUCOSE: 117 mg/dL — AB (ref 65–99)
Potassium: 4.2 mmol/L (ref 3.5–5.1)
SODIUM: 133 mmol/L — AB (ref 135–145)
TOTAL PROTEIN: 5.4 g/dL — AB (ref 6.5–8.1)

## 2015-12-26 LAB — CBC
HEMATOCRIT: 29.4 % — AB (ref 39.0–52.0)
Hemoglobin: 8.8 g/dL — ABNORMAL LOW (ref 13.0–17.0)
MCH: 27.2 pg (ref 26.0–34.0)
MCHC: 29.9 g/dL — AB (ref 30.0–36.0)
MCV: 91 fL (ref 78.0–100.0)
Platelets: 321 10*3/uL (ref 150–400)
RBC: 3.23 MIL/uL — ABNORMAL LOW (ref 4.22–5.81)
RDW: 19.1 % — AB (ref 11.5–15.5)
WBC: 13.1 10*3/uL — ABNORMAL HIGH (ref 4.0–10.5)

## 2015-12-26 LAB — GLUCOSE, CAPILLARY
GLUCOSE-CAPILLARY: 126 mg/dL — AB (ref 65–99)
GLUCOSE-CAPILLARY: 118 mg/dL — AB (ref 65–99)
Glucose-Capillary: 122 mg/dL — ABNORMAL HIGH (ref 65–99)

## 2015-12-26 MED ORDER — JEVITY 1.2 CAL PO LIQD
1000.0000 mL | ORAL | Status: DC
  Administered 2015-12-26: 1000 mL
  Filled 2015-12-26 (×2): qty 1000
  Filled 2015-12-26: qty 237

## 2015-12-26 MED ORDER — OSMOLITE 1.5 CAL PO LIQD
1000.0000 mL | ORAL | Status: DC
  Administered 2015-12-26: 1000 mL
  Filled 2015-12-26 (×2): qty 1000

## 2015-12-26 MED ORDER — TAMSULOSIN HCL 0.4 MG PO CAPS
0.4000 mg | ORAL_CAPSULE | Freq: Every day | ORAL | Status: DC
  Administered 2015-12-26 – 2016-01-03 (×9): 0.4 mg via ORAL
  Filled 2015-12-26 (×9): qty 1

## 2015-12-26 MED ORDER — OSMOLITE 1.5 CAL PO LIQD
1000.0000 mL | ORAL | Status: DC
  Administered 2015-12-27 – 2015-12-28 (×2): 1000 mL
  Filled 2015-12-26 (×4): qty 1000

## 2015-12-26 NOTE — Progress Notes (Signed)
[facility]  Progress Note  Subjective: Patient reports feeling well and having slept well. No issues of abdominal pain, nausea, or vomiting overnight. Currently NPO with Foley cath.   Objective: Vital signs in last 24 hours: Temp:  [98.9 F (37.2 C)-100.1 F (37.8 C)] 100.1 F (37.8 C) (09/29 [id number]) Pulse Rate:  [91-135] 99 (09/29 0632) Resp:  [16-24] 16 (09/29 0632) BP: (111-144)/(73-84) 132/76 (09/29 0632) SpO2:  [94 %-100 %] 100 % (09/29 [id number]) Weight:  [60.8 kg (134 lb)] 60.8 kg (134 lb) (09/28 1726) Last BM Date: (P) 12/25/15  Intake/Output from previous day: 09/28 0701 - 09/29 0700 In: 841.7 [I.V.:841.7] Out: 650 [Urine:650] Intake/Output this shift: No intake/output data recorded.  PE: Constitutional: He appears well-developed and comfortably lying in bed HENT: mucous membranes moist Eyes: no icterus  Cardiovascular: Regular rhythm, normal heart sounds and intact distal pulses.   No murmur heard. Respiratory: Effort normal and breath sounds normal.  GI: Soft. Normal appearance. He exhibits no distension. Bowel sounds are normal. There is no tenderness. There is no rigidity, no rebound and no guarding. No hernia. Light brown liquid stool in ostomy bag.   Lab Results:   Recent Labs  12/25/15 1736 12/26/15 0636  WBC 18.1* 13.1*  HGB 10.7* 8.8*  HCT 33.6* 29.4*  PLT 419* 321   BMET  Recent Labs  12/25/15 1736 12/26/15 0636  NA 132* 133*  K 4.3 4.2  CL 98* 101  CO2 25 28  GLUCOSE 170* 117*  BUN 10 7  CREATININE 0.81 0.76  CALCIUM 8.5* 8.1*   PT/INR No results for input(s): LABPROT, INR in the last 72 hours. CMP     Component Value Date/Time   NA 133 (L) 12/26/2015 0636   K 4.2 12/26/2015 0636   CL 101 12/26/2015 0636   CO2 28 12/26/2015 0636   GLUCOSE 117 (H) 12/26/2015 0636   BUN 7 12/26/2015 0636   CREATININE 0.76 12/26/2015 0636   CALCIUM 8.1 (L) 12/26/2015 0636   PROT 5.4 (L) 12/26/2015 0636   ALBUMIN 2.1 (L) 12/26/2015  0636   AST 17 12/26/2015 0636   ALT 16 (L) 12/26/2015 0636   ALKPHOS 87 12/26/2015 0636   BILITOT 0.8 12/26/2015 0636   GFRNONAA >60 12/26/2015 0636   GFRAA >60 12/26/2015 0636   Lipase     Component Value Date/Time   LIPASE 44 11/13/2015 2300       Studies/Results: Ct Abdomen Pelvis W Contrast  Result Date: 12/25/2015 CLINICAL DATA:  Abdominal pain.  History of sigmoid diverticulitis. EXAM: CT ABDOMEN AND PELVIS WITH CONTRAST TECHNIQUE: Multidetector CT imaging of the abdomen and pelvis was performed using the standard protocol following bolus administration of intravenous contrast. CONTRAST:  1 ISOVUE-300 IOPAMIDOL (ISOVUE-300) INJECTION 61% COMPARISON:  12/15/2015. FINDINGS: Lower chest: Moderate left pleural effusion identified. Small right pleural effusion. These are both unchanged from previous exam. Hepatobiliary: No focal liver abnormality. The gallbladder is normal. No biliary dilatation identified. Pancreas: Unremarkable. No pancreatic ductal dilatation or surrounding inflammatory changes. Spleen: Normal in size without focal abnormality. Adrenals/Urinary Tract: Normal adrenal glands. Cyst within the posterior medial right kidney measures 1.3 cm. There are small bilateral renal calculi identified. No hydronephrosis. Urinary bladder appears normal. Stomach/Bowel: Percutaneous duodenum will feeding tube is identified. The patient is status post gastrojejunostomy. There is a left upper quadrant gastrostomy tube with retaining balloon in the stomach. No pathologic dilatation of the small or large bowel loops. 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 Parastomal hernia  contains nonobstructed loops of small bowel. Vascular/Lymphatic: Calcified atherosclerotic disease involves the abdominal aorta. No aneurysm. No upper abdominal adenopathy. Left pelvic sidewall lymph node is borderline enlarged measuring 1 cm, image 68 of series 2. Unchanged from previous exam. Reproductive:  Prostate gland appears normal. Other: Trace ascites identified within the upper abdomen. Musculoskeletal: No acute or significant osseous findings. Degenerative disc disease noted within the lumbar spine. IMPRESSION: 1. No significant change when compared with recent CT dated 12/15/2015. 2. Gastrostomy tube and duodenal feeding tubes are in stable position. There is no pathologic dilatation of the large or small bowel loops. 3. Persistent bilateral pleural effusions, left greater than right. 4. Aortic atherosclerosis 5. Kidney stones Electronically Signed   By: Kerby Moors M.D.   On: 12/25/2015 22:10   Dg Chest Port 1 View  Result Date: 12/25/2015 CLINICAL DATA:  65 year old with acute onset of fever which began earlier today. Patient discharged from the hospital yesterday after having a feeding tube placed. Hiatal hernia repair 11/21/2015. Perforated ulcer repair 10/30/2015. EXAM: PORTABLE CHEST 1 VIEW COMPARISON:  12/12/2015, 11/21/2015 and earlier, including CT chest 11/22/2015 and earlier. FINDINGS: Cardiac silhouette normal in size, unchanged. Thoracic aorta mildly atherosclerotic, unchanged. Hilar and mediastinal contours otherwise unremarkable. Persistent moderate-sized left pleural effusion and dense consolidation in the left lower lobe. Lungs otherwise clear. No visible right pleural effusion. Pulmonary vascularity normal. No pneumothorax. Surgical clips in the left axilla presumably from node dissection related to remote history of melanoma. IMPRESSION: 1. Moderate-sized left pleural effusion and dense atelectasis and/or pneumonia involving the left lower lobe, unchanged since the most recent previous examination 2 weeks ago. 2. No new abnormalities. 3. Thoracic aortic atherosclerosis. Electronically Signed   By: Evangeline Dakin M.D.   On: 12/25/2015 19:56    Anti-infectives: Anti-infectives    Start     Dose/Rate Route Frequency Ordered Stop   12/25/15 2000  piperacillin-tazobactam (ZOSYN)  IVPB 3.375 g     3.375 g 12.5 mL/hr over 240 Minutes Intravenous Every 8 hours 12/25/15 1936     12/25/15 1915  vancomycin (VANCOCIN) 1,216 mg in sodium chloride 0.9 % 500 mL IVPB  Status:  Discontinued     20 mg/kg  60.8 kg 250 mL/hr over 120 Minutes Intravenous  Once 12/25/15 1910 12/25/15 1920   12/25/15 1915  piperacillin-tazobactam (ZOSYN) IVPB 4.5 g  Status:  Discontinued     4.5 g 200 mL/hr over 30 Minutes Intravenous  Once 12/25/15 1910 12/25/15 1920       Assessment/Plan  HD # 1 following readmission for fever. Currently on Zosyn. Blood and urine cultures are pending. S/P recent admission for malnutrition and dehydration. CT C/A/P without significant change or indication of obvious source of fever. CXR only notable for effusions. S/p sigmoid diverticulitis with perforation status post sigmoid colectomy and colostomy 07/05/15 Dr. [name] S/p Repair of perforated ulcer, resection of distal stomach With [facility] reconstr.-10/30/15 Dr. Hulen Skains S/p open reduction of hiatal hernia, repair of hiatal hernia, resection of small bowel with, anastomosis placement of gastrostomy tube, placement of duodenostomy tube 11/21/15 Dr. Kieth Brightly. Hospitalized 8/17-9/08/17  ID: No obvious source of fever yet identified. Tmax overnight 100.1 and leukocytosis improving with WBC count now 13K down from 18K. Consider removal of Foley to reduce risk of development of UTI and to increase mobility. Diet: Consider restarting tube feeds given clinical evidence of normal bowel function. On pepcid for GI prophylaxis. DVT/PE prophylaxis: Lovenox. Ambulation.   LOS: 1 day    LEE Alexzavier Girardin, PA-C  [facility] Surgery Pager [phone] 12/26/2015, 8:23 AM

## 2015-12-26 NOTE — Progress Notes (Signed)
Initial Nutrition Assessment  DOCUMENTATION CODES:   Severe malnutrition in context of chronic illness  INTERVENTION:   Initiate Osmolite 1.5 @ 55 ml/hr via PEG over 18 hour period (1600-1000)  Tube feeding regimen provides 1485 kcal (83% of needs), 62 grams of protein, and 754 ml of H2O.   NUTRITION DIAGNOSIS:   Malnutrition related to chronic illness as evidenced by severe depletion of body fat, severe depletion of muscle mass, percent weight loss.  GOAL:   Patient will meet greater than or equal to 90% of their needs  MONITOR:   Labs, Diet advancement, Weight trends, TF tolerance, Skin, I & O's  REASON FOR ASSESSMENT:   Malnutrition Screening Tool, Consult Enteral/tube feeding initiation and management  ASSESSMENT:   Patient well-known to our service with previous gastrectomy with BII, Duodenal blowout, Abdominal abscesses, previous colectomy with a colostomy, now here with fevers at home of 102, difficulty urinating and abdominal pain, but no nausea or vomiting.  Pt admitted with abdominal pain.   Pt is familiar RD due to multiple previous admissions. Pt just d/c from [facility] on 12/24/15.   Pt in with staff member at time of visit. Unable to interview pt or complete nutrition focused physical exam at this time. Noted exam was completed on 12/13/15; findings were consistent with severe fat and muscle wasting. RD suspects no changes to exam at this time.   Wt hx reviewed. Pt has experienced 22% wt loss over the past 6 months, which is significant for time.   Pt has hx of severe malnutrition in the context of chronic illness, which is ongoing.   Home TF regimen is as follows: Osmolite 1.5 @ 55 ml/hr via PEG over 18 hour period (provides 1485 kcal (83% of needs), 62 grams of protein, and 754 ml of H2O). RD will initiate home regimen.   Case discussed with RN.   Labs reviewed: Glucose: 106.   Diet Order:  Diet NPO time specified  Skin:  Reviewed, no issues  Last BM:   12/25/15  Height:   Ht Readings from Last 1 Encounters:  12/25/15 5\' 5"  (1.651 m)    Weight:   Wt Readings from Last 1 Encounters:  12/25/15 134 lb (60.8 kg)    Ideal Body Weight:  61.8 kg  BMI:  Body mass index is 22.3 kg/m.  Estimated Nutritional Needs:   Kcal:  1800-2000  Protein:  85-100 grams  Fluid:  1.8-2.0 L  EDUCATION NEEDS:   No education needs identified at this time  Ghazal Pevey A. Jimmye Norman, RD, LDN, CDE Pager: [phone] After hours Pager: [phone]

## 2015-12-26 NOTE — Consult Note (Signed)
   [facility] CM Inpatient Consult   12/26/2015  ARIEZ NOLETTE 1950/07/17 [id number]   Came to visit [name] on behalf of Norm Parcel to [employer] program for [employer] with [employer]. Mr. Shimko recently discharged from hospital. Reports he spike a fever and came back to hospital. Will continue to follow. Spoke with inpatient RNCM. Will continue to follow.   [name], MSN-Ed, RN,BSN [facility] Psychiatric Liaison [phone]

## 2015-12-26 NOTE — Care Management Note (Signed)
Case Management Note  Patient Details  Name: Miguel [name] MRN: [id number] Date of Birth: 1951-03-02  Subjective/Objective:                    Action/Plan:  Prior to admission patient was active with [place] for [facility] . Will need resumption of care order.   Patient also had tube feedings at home through [place]. Expected Discharge Date:                  Expected Discharge Plan:  [place]  In-House Referral:     Discharge planning Services  CM Consult  Post Acute Care Choice:    Choice offered to:     DME Arranged:    DME Agency:     HH Arranged:    [place] Agency:     Status of Service:  In process, will continue to follow  If discussed at Long Length of Stay Meetings, dates discussed:    Additional Comments:  Marilu Favre, RN 12/26/2015, 9:52 AM

## 2015-12-26 NOTE — Progress Notes (Signed)
ANTIBIOTIC CONSULT NOTE - f/u  Pharmacy Consult for Zosyn Indication: Abdominal infection  Allergies  Allergen Reactions  . Zofran [Ondansetron Hcl] Nausea And Vomiting    Patient Measurements: Height: 5\' 5"  (165.1 cm) Weight: 134 lb (60.8 kg) IBW/kg (Calculated) : 61.5 Adjusted Body Weight:   Vital Signs: Temp: 100.1 F (37.8 C) (09/29 0632) Temp Source: Oral (09/29 [id number]) BP: 132/76 (09/29 [id number]) Pulse Rate: 99 (09/29 [id number]) Intake/Output from previous day: 09/28 0701 - 09/29 0700 In: 841.7 [I.V.:841.7] Out: 650 [Urine:650] Intake/Output from this shift: No intake/output data recorded.  Labs:  Recent Labs  12/25/15 1736 12/26/15 0636  WBC 18.1* 13.1*  HGB 10.7* 8.8*  PLT 419* 321  CREATININE 0.81 0.76   Estimated Creatinine Clearance: 79.2 mL/min (by C-G formula based on SCr of 0.76 mg/dL). No results for input(s): VANCOTROUGH, VANCOPEAK, VANCORANDOM, GENTTROUGH, GENTPEAK, GENTRANDOM, TOBRATROUGH, TOBRAPEAK, TOBRARND, AMIKACINPEAK, AMIKACINTROU, AMIKACIN in the last 72 hours.   Microbiology: No results found for this or any previous visit (from the past 720 hour(s)).  Medical History: Past Medical History:  Diagnosis Date  . Anemia   . Anxiety   . Gastritis   . GI bleed due to NSAIDs 10/27/2015  . Hyperlipidemia   . Hypertension   . Kidney stones   . 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 Sigmoid diverticulitis    with perforation    Assessment:  ID: fever and abdominal pain. History of multiple intra-abdominal surgeries with recent infection.  - Tmax 100.1. wBC 13.1 down, Scr 0.76, CrCl 79  Goal of Therapy:  Eradication of infection  Plan:  Zosyn 3.375g IV q8hr Pharmacy will sign off. Please reconsult for further dosing assitance.  Trudee Chirino S. Alford Highland, PharmD, BCPS Clinical Staff Pharmacist Pager [phone]  Eilene Ghazi Stillinger 12/26/2015,8:19 AM

## 2015-12-27 LAB — URINE CULTURE

## 2015-12-27 LAB — GLUCOSE, CAPILLARY
GLUCOSE-CAPILLARY: 119 mg/dL — AB (ref 65–99)
Glucose-Capillary: 123 mg/dL — ABNORMAL HIGH (ref 65–99)
Glucose-Capillary: 119 mg/dL — ABNORMAL HIGH (ref 65–99)
Glucose-Capillary: 134 mg/dL — ABNORMAL HIGH (ref 65–99)

## 2015-12-27 MED ORDER — METOCLOPRAMIDE HCL 5 MG/5ML PO SOLN
5.0000 mg | Freq: Three times a day (TID) | ORAL | Status: DC
  Administered 2015-12-27 – 2016-01-03 (×20): 5 mg
  Filled 2015-12-27 (×22): qty 10

## 2015-12-27 NOTE — Progress Notes (Signed)
Patient ID: Miguel [name], male   DOB: 29-Jan-1951, 65 y.o.   MRN: [id number]  [facility] Surgery Progress Note     Subjective: Feling better this morning. No high temps or acute events over night. Noticed some drainage from midline abdominal incision last night. Flomax significantly helping.  Objective: Vital signs in last 24 hours: Temp:  [97.8 F (36.6 C)-98.9 F (37.2 C)] 98.9 F (37.2 C) (09/30 0447) Pulse Rate:  [83-91] 91 (09/30 0447) Resp:  [18] 18 (09/30 0447) BP: (111-115)/(62-71) 111/62 (09/30 0447) SpO2:  [100 %] 100 % (09/30 0447) Last BM Date: 12/27/15  Intake/Output from previous day: 09/29 0701 - 09/30 0700 In: 1998.7 [I.V.:1418.7; NG/GT:481; IV Piggyback:99] Out: 2800 [Urine:2800] Intake/Output this shift: Total I/O In: 1136 [I.V.:1022; IV Piggyback:114] Out: 250 [Stool:250]  PE: General appearance: alert, cooperative, no acute distress Resp: clear to auscultation bilaterally GI: soft, + BS, Both tube sites look good. Midline incision found to have suture abscess  Before cleaning out and removing suture material:  After:     Colostomy output 272mL/24hr  Lab Results:   Recent Labs  12/25/15 1736 12/26/15 0636  WBC 18.1* 13.1*  HGB 10.7* 8.8*  HCT 33.6* 29.4*  PLT 419* 321   BMET  Recent Labs  12/25/15 1736 12/26/15 0636  NA 132* 133*  K 4.3 4.2  CL 98* 101  CO2 25 28  GLUCOSE 170* 117*  BUN 10 7  CREATININE 0.81 0.76  CALCIUM 8.5* 8.1*   PT/INR No results for input(s): LABPROT, INR in the last 72 hours. CMP     Component Value Date/Time   NA 133 (L) 12/26/2015 0636   K 4.2 12/26/2015 0636   CL 101 12/26/2015 0636   CO2 28 12/26/2015 0636   GLUCOSE 117 (H) 12/26/2015 0636   BUN 7 12/26/2015 0636   CREATININE 0.76 12/26/2015 0636   CALCIUM 8.1 (L) 12/26/2015 0636   PROT 5.4 (L) 12/26/2015 0636   ALBUMIN 2.1 (L) 12/26/2015 0636   AST 17 12/26/2015 0636   ALT 16 (L) 12/26/2015 0636   ALKPHOS 87 12/26/2015 0636     BILITOT 0.8 12/26/2015 0636   GFRNONAA >60 12/26/2015 0636   GFRAA >60 12/26/2015 0636   Lipase     Component Value Date/Time   LIPASE 44 11/13/2015 2300       Studies/Results: Ct Abdomen Pelvis W Contrast  Result Date: 12/25/2015 CLINICAL DATA:  Abdominal pain.  History of sigmoid diverticulitis. EXAM: CT ABDOMEN AND PELVIS WITH CONTRAST TECHNIQUE: Multidetector CT imaging of the abdomen and pelvis was performed using the standard protocol following bolus administration of intravenous contrast. CONTRAST:  1 ISOVUE-300 IOPAMIDOL (ISOVUE-300) INJECTION 61% COMPARISON:  12/15/2015. FINDINGS: Lower chest: Moderate left pleural effusion identified. Small right pleural effusion. These are both unchanged from previous exam. Hepatobiliary: No focal liver abnormality. The gallbladder is normal. No biliary dilatation identified. Pancreas: Unremarkable. No pancreatic ductal dilatation or surrounding inflammatory changes. Spleen: Normal in size without focal abnormality. Adrenals/Urinary Tract: Normal adrenal glands. Cyst within the posterior medial right kidney measures 1.3 cm. There are small bilateral renal calculi identified. No hydronephrosis. Urinary bladder appears normal. Stomach/Bowel: Percutaneous duodenum will feeding tube is identified. The patient is status post gastrojejunostomy. There is a left upper quadrant gastrostomy tube with retaining balloon in the stomach. No pathologic dilatation of the small or large bowel loops. 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 Parastomal hernia contains nonobstructed loops of small bowel. Vascular/Lymphatic: Calcified atherosclerotic disease involves the abdominal aorta.  No aneurysm. No upper abdominal adenopathy. Left pelvic sidewall lymph node is borderline enlarged measuring 1 cm, image 68 of series 2. Unchanged from previous exam. Reproductive: Prostate gland appears normal. Other: Trace ascites identified within the upper abdomen.  Musculoskeletal: No acute or significant osseous findings. Degenerative disc disease noted within the lumbar spine. IMPRESSION: 1. No significant change when compared with recent CT dated 12/15/2015. 2. Gastrostomy tube and duodenal feeding tubes are in stable position. There is no pathologic dilatation of the large or small bowel loops. 3. Persistent bilateral pleural effusions, left greater than right. 4. Aortic atherosclerosis 5. Kidney stones Electronically Signed   By: Kerby Moors M.D.   On: 12/25/2015 22:10   Dg Chest Port 1 View  Result Date: 12/25/2015 CLINICAL DATA:  65 year old with acute onset of fever which began earlier today. Patient discharged from the hospital yesterday after having a feeding tube placed. Hiatal hernia repair 11/21/2015. Perforated ulcer repair 10/30/2015. EXAM: PORTABLE CHEST 1 VIEW COMPARISON:  12/12/2015, 11/21/2015 and earlier, including CT chest 11/22/2015 and earlier. FINDINGS: Cardiac silhouette normal in size, unchanged. Thoracic aorta mildly atherosclerotic, unchanged. Hilar and mediastinal contours otherwise unremarkable. Persistent moderate-sized left pleural effusion and dense consolidation in the left lower lobe. Lungs otherwise clear. No visible right pleural effusion. Pulmonary vascularity normal. No pneumothorax. Surgical clips in the left axilla presumably from node dissection related to remote history of melanoma. IMPRESSION: 1. Moderate-sized left pleural effusion and dense atelectasis and/or pneumonia involving the left lower lobe, unchanged since the most recent previous examination 2 weeks ago. 2. No new abnormalities. 3. Thoracic aortic atherosclerosis. Electronically Signed   By: Evangeline Dakin M.D.   On: 12/25/2015 19:56    Anti-infectives: Anti-infectives    Start     Dose/Rate Route Frequency Ordered Stop   12/25/15 2000  piperacillin-tazobactam (ZOSYN) IVPB 3.375 g     3.375 g 12.5 mL/hr over 240 Minutes Intravenous Every 8 hours 12/25/15  1936     12/25/15 1915  vancomycin (VANCOCIN) 1,216 mg in sodium chloride 0.9 % 500 mL IVPB  Status:  Discontinued     20 mg/kg  60.8 kg 250 mL/hr over 120 Minutes Intravenous  Once 12/25/15 1910 12/25/15 1920   12/25/15 1915  piperacillin-tazobactam (ZOSYN) IVPB 4.5 g  Status:  Discontinued     4.5 g 200 mL/hr over 30 Minutes Intravenous  Once 12/25/15 1910 12/25/15 1920       Assessment/Plan S/p sigmoid diverticulitis with perforation status post sigmoid colectomy and colostomy 07/05/15 Dr. [name] S/p Repair of perforated ulcer, resection of distal stomach With [facility] reconstr.-10/30/15 Dr. Hulen Skains S/p open reduction of hiatal hernia, repair of hiatal hernia, resection of small bowel with, anastomosis placement of gastrostomy tube, placement of duodenostomy tube 11/21/15 Dr. Kieth Brightly.  Hospitalized 8/17-9/08/17 Hospitalized 9/15-9/28/17 Readmitted 12/25/15 with fever of unknown etiology - CT no significant change from prior CT - CXR Moderate-sized left pleural effusion and dense atelectasis and/or pneumonia involving the left lower lobe, unchanged since the most recent previous examination 2 weeks ago. - blood cultures negative - UA negative - urine culture pending - no fevers over night. WBC down to 13.1 yesterday. - suture abscess found and cleaned out. Continue BID wet to dry dressing changes  FEN: Tube Feeding (goal 65 cc/hr- current rate 40 ml/hr) Diet: Full liquids ID: zosyn day 3 DVT: lovenox  Plan: suture abscess (pictured above) cleaned and wet to dry dressing applied (recommend BID changes). Continue zosyn. Continue IS, mobilize. CBC in AM.  LOS: 2 days    Jerrye Beavers , [facility] Surgery 12/27/2015, 10:15 AM Pager: [phone] Consults: [phone] Mon-Fri 7:00 am-4:30 pm Sat-Sun 7:00 am-11:30 am

## 2015-12-28 ENCOUNTER — Encounter (HOSPITAL_COMMUNITY): Payer: Self-pay

## 2015-12-28 LAB — GLUCOSE, CAPILLARY
Glucose-Capillary: 123 mg/dL — ABNORMAL HIGH (ref 65–99)
Glucose-Capillary: 111 mg/dL — ABNORMAL HIGH (ref 65–99)
Glucose-Capillary: 141 mg/dL — ABNORMAL HIGH (ref 65–99)

## 2015-12-28 LAB — CBC
HEMATOCRIT: 25.5 % — AB (ref 39.0–52.0)
Hemoglobin: 7.7 g/dL — ABNORMAL LOW (ref 13.0–17.0)
MCH: 27.3 pg (ref 26.0–34.0)
MCHC: 30.2 g/dL (ref 30.0–36.0)
MCV: 90.4 fL (ref 78.0–100.0)
Platelets: 327 10*3/uL (ref 150–400)
RBC: 2.82 MIL/uL — ABNORMAL LOW (ref 4.22–5.81)
RDW: 18.4 % — AB (ref 11.5–15.5)
WBC: 5.7 10*3/uL (ref 4.0–10.5)

## 2015-12-28 MED ORDER — ENSURE ENLIVE PO LIQD: 237.0000 mL | Freq: Two times a day (BID) | ORAL | Status: DC

## 2015-12-28 NOTE — Progress Notes (Signed)
Patient ID: Miguel [name], male   DOB: Jul 13, 1950, 65 y.o.   MRN: [id number]  [facility] Surgery Progress Note     Subjective: States that he is doing well this morning, no complaints. Tolerated TF at 77mL/hr last night. Denies CP, SOB. He does have a cough at times, but this typically occurs during/after TF.  Objective: Vital signs in last 24 hours: Temp:  [98.2 F (36.8 C)-98.8 F (37.1 C)] 98.2 F (36.8 C) (10/01 0612) Pulse Rate:  [83-98] 83 (10/01 0612) Resp:  [18-19] 19 (10/01 0612) BP: (110-116)/(65-77) 116/73 (10/01 0612) SpO2:  [95 %-100 %] 100 % (10/01 0612) Weight:  [137 lb 2 oz (62.2 kg)] 137 lb 2 oz (62.2 kg) (10/01 0014) Last BM Date: 12/27/15  Intake/Output from previous day: 09/30 0701 - 10/01 0700 In: 5181 [P.O.:700; I.V.:3297; NG/GT:993; IV Piggyback:191] Out: [id number] [Urine:3550; Stool:275] Intake/Output this shift: No intake/output data recorded.  PE: General appearance: alert, cooperative, no acute distress Resp: clear to auscultation bilaterally GI: soft, + BS, Both tube sites look good. Midline incision with 1cm area in central aspect open, no drainage.  Lab Results:   Recent Labs  12/26/15 0636 12/28/15 0515  WBC 13.1* 5.7  HGB 8.8* 7.7*  HCT 29.4* 25.5*  PLT 321 327   BMET  Recent Labs  12/25/15 1736 12/26/15 0636  NA 132* 133*  K 4.3 4.2  CL 98* 101  CO2 25 28  GLUCOSE 170* 117*  BUN 10 7  CREATININE 0.81 0.76  CALCIUM 8.5* 8.1*   PT/INR No results for input(s): LABPROT, INR in the last 72 hours. CMP     Component Value Date/Time   NA 133 (L) 12/26/2015 0636   K 4.2 12/26/2015 0636   CL 101 12/26/2015 0636   CO2 28 12/26/2015 0636   GLUCOSE 117 (H) 12/26/2015 0636   BUN 7 12/26/2015 0636   CREATININE 0.76 12/26/2015 0636   CALCIUM 8.1 (L) 12/26/2015 0636   PROT 5.4 (L) 12/26/2015 0636   ALBUMIN 2.1 (L) 12/26/2015 0636   AST 17 12/26/2015 0636   ALT 16 (L) 12/26/2015 0636   ALKPHOS 87 12/26/2015 0636   BILITOT  0.8 12/26/2015 0636   GFRNONAA >60 12/26/2015 0636   GFRAA >60 12/26/2015 0636   Lipase     Component Value Date/Time   LIPASE 44 11/13/2015 2300       Studies/Results: No results found.  Anti-infectives: Anti-infectives    Start     Dose/Rate Route Frequency Ordered Stop   12/25/15 2000  piperacillin-tazobactam (ZOSYN) IVPB 3.375 g     3.375 g 12.5 mL/hr over 240 Minutes Intravenous Every 8 hours 12/25/15 1936     12/25/15 1915  vancomycin (VANCOCIN) 1,216 mg in sodium chloride 0.9 % 500 mL IVPB  Status:  Discontinued     20 mg/kg  60.8 kg 250 mL/hr over 120 Minutes Intravenous  Once 12/25/15 1910 12/25/15 1920   12/25/15 1915  piperacillin-tazobactam (ZOSYN) IVPB 4.5 g  Status:  Discontinued     4.5 g 200 mL/hr over 30 Minutes Intravenous  Once 12/25/15 1910 12/25/15 1920       Assessment/Plan S/p sigmoid diverticulitis with perforation status post sigmoid colectomy and colostomy 07/05/15 Dr. [name] S/p Repair of perforated ulcer, resection of distal stomach With [facility] reconstr.-10/30/15 Dr. Hulen Skains S/p open reduction of hiatal hernia, repair of hiatal hernia, resection of small bowel with, anastomosis placement of gastrostomy tube, placement of duodenostomy tube 11/21/15 Dr. Kieth Brightly.  Hospitalized 8/17-9/08/17 Hospitalized 9/15-9/28/17  Readmitted 12/25/15 with fever of unknown etiology - blood cultures negative - UA negative - urine culture negative - Moderate-sized left pleural effusion and dense atelectasis and/or pneumonia involving the left lower lobe >> consider IR drainage next week, continue IS and ambulation and antibiotics - no fevers over night. WBC down to 5.7 today  FEN: Tube Feeding (goal 65 cc/hr- current rate 85ml/hr) Diet: Full liquids ID: zosyn day 4 DVT: lovenox  Plan: Continue zosyn, IS, mobilize. Continue BID wet to dry dressing changes to midline incision/suture abscess. Consider IR consult for possible drainage of pleural effusion.  Followed by nutrition, consider increasing TF rate to 49mL/hr.   LOS: 3 days    Jerrye Beavers , [facility] Surgery 12/28/2015, 7:38 AM Pager: [phone] Consults: [phone] Mon-Fri 7:00 am-4:30 pm Sat-Sun 7:00 am-11:30 am

## 2015-12-29 ENCOUNTER — Inpatient Hospital Stay (HOSPITAL_COMMUNITY): Payer: 59

## 2015-12-29 LAB — BODY FLUID CELL COUNT WITH DIFFERENTIAL
EOS FL: 0 %
Lymphs, Fluid: 70 %
MONOCYTE-MACROPHAGE-SEROUS FLUID: 23 % — AB (ref 50–90)
Neutrophil Count, Fluid: 7 % (ref 0–25)
Total Nucleated Cell Count, Fluid: 1721 cu mm — ABNORMAL HIGH (ref 0–1000)

## 2015-12-29 LAB — GLUCOSE, CAPILLARY
GLUCOSE-CAPILLARY: 119 mg/dL — AB (ref 65–99)
GLUCOSE-CAPILLARY: 109 mg/dL — AB (ref 65–99)
GLUCOSE-CAPILLARY: 111 mg/dL — AB (ref 65–99)
Glucose-Capillary: 112 mg/dL — ABNORMAL HIGH (ref 65–99)

## 2015-12-29 LAB — GRAM STAIN

## 2015-12-29 MED ORDER — LIDOCAINE HCL 1 % IJ SOLN
INTRAMUSCULAR | Status: AC
Start: 2015-12-29 — End: 2015-12-29
  Filled 2015-12-29: qty 20

## 2015-12-29 MED ORDER — OSMOLITE 1.5 CAL PO LIQD
1000.0000 mL | ORAL | Status: DC
  Administered 2015-12-29 – 2015-12-30 (×2): 1000 mL
  Filled 2015-12-29 (×4): qty 1000

## 2015-12-29 NOTE — Progress Notes (Signed)
Nutrition Follow-up  DOCUMENTATION CODES:   Severe malnutrition in context of chronic illness  INTERVENTION:   Increase Osmolite 1.5to 66ml/hr via PEG over 18 hour period (1600-1000). Goal rate is 55 ml/hr (titration per MD discretion)  Tube feeding regimen provides 1485kcal (83% of needs), 62grams of protein, and 764ml of H2O.   NUTRITION DIAGNOSIS:   Malnutrition related to chronic illness as evidenced by severe depletion of body fat, severe depletion of muscle mass, percent weight loss.  Ongoing  GOAL:   Patient will meet greater than or equal to 90% of their needs  Progressing  MONITOR:   Labs, Diet advancement, Weight trends, TF tolerance, Skin, I & O's  REASON FOR ASSESSMENT:   Malnutrition Screening Tool, Consult Enteral/tube feeding initiation and management  ASSESSMENT:   Patient well-known to our service with previous gastrectomy with BII, Duodenal blowout, Abdominal abscesses, previous colectomy with a colostomy, now here with fevers at home of 102, difficulty urinating and abdominal pain, but no nausea or vomiting.  Pt down for lt thoracentesis during visit due to pleural effusion. Per notes, 200 ml fluid removed.   Spoke with PA Izora Gala; requesting that pt's TF rate be increased to 45 ml/hr (which will provide 1245 kcals, 51 grams of protein, and 617 ml fluid, meeting 70% of estimated kcal needs and 60% of estimated protein needs). TF currently infusing @ 40 ml/hr, which provides 1080 kcals,  45 grams protein, and 549 ml free water daily (meeting 60% of estimated kcal needs and 53% of estimated protein needs).   Case discussed with RN, who reveals pt is tolerating TF well. He is not consuming a lot of liquids, which is typical for him. Discussed plan to increase feeding rate. RN confirms that TF has been running over 18 hour period as ordered.   Labs reviewed: CBGS: 109-141.   Diet Order:  Diet full liquid Room service appropriate? Yes; Fluid  consistency: Thin  Skin:  Reviewed, no issues  Last BM:  12/29/15  Height:   Ht Readings from Last 1 Encounters:  12/25/15 5\' 5"  (1.651 m)    Weight:   Wt Readings from Last 1 Encounters:  12/29/15 139 lb 1.8 oz (63.1 kg)    Ideal Body Weight:  61.8 kg  BMI:  Body mass index is 23.15 kg/m.  Estimated Nutritional Needs:   Kcal:  1800-2000  Protein:  85-100 grams  Fluid:  1.8-2.0 L  EDUCATION NEEDS:   No education needs identified at this time  Anushka Hartinger A. Jimmye Norman, RD, LDN, CDE Pager: [phone] After hours Pager: [phone]

## 2015-12-29 NOTE — Consult Note (Signed)
   [facility] CM Inpatient Consult   12/29/2015  EAVEN WESTERFELD May 08, 1950 [id number]   Link to [facility] Care Management follow up visit. Mr. Schurman reports he had some fluid drained today. Will continue to follow. Appreciative of visit.    [name], MSN-Ed, RN,BSN [facility] Liaison [phone]

## 2015-12-29 NOTE — Procedures (Signed)
Ultrasound-guided diagnostic and therapeutic left  thoracentesis performed yielding 200 cc of hazy, amber colored fluid. No immediate complications. Follow-up chest x-ray pending. The fluid was sent to the lab for preordered studies.

## 2015-12-29 NOTE — Progress Notes (Signed)
Patient ID: Miguel [name], male   DOB: [birth year], 65 y.o.   MRN: [id number]  [facility] Surgery Progress Note     Subjective: Feels about the same as yesterday. Reports some lower abdominal tightness that is different than yesterday, but no severe pain. Denies CP or SOB. Still has some indigestion with TF over night. No appetite.  Objective: Vital signs in last 24 hours: Temp:  [98.1 F (36.7 C)-99.1 F (37.3 C)] 98.1 F (36.7 C) (10/02 0623) Pulse Rate:  [84-107] 84 (10/02 0623) Resp:  [16-18] 16 (10/02 0623) BP: (104-130)/(65-78) 104/65 (10/02 0623) SpO2:  [96 %-99 %] 96 % (10/02 0623) Weight:  [139 lb 1.8 oz (63.1 kg)] 139 lb 1.8 oz (63.1 kg) (10/02 0623) Last BM Date: 12/28/15  Intake/Output from previous day: 10/01 0701 - 10/02 0700 In: 3514 [P.O.:220; I.V.:2253.3; NG/GT:790.7; IV Piggyback:250] Out: 3600 [Urine:3350; Stool:250] Intake/Output this shift: Total I/O In: -  Out: 100 [Urine:100]  PE: General appearance: alert, cooperative, no acute distress Resp: clear to auscultation bilaterally GI: soft, + BS, Both tube sites look good. slight tenderness lower abdomen, Midline incision with 1cm area in central aspect open, dressing in place.  Lab Results:   Recent Labs  12/28/15 0515  WBC 5.7  HGB 7.7*  HCT 25.5*  PLT 327   BMET No results for input(s): NA, K, CL, CO2, GLUCOSE, BUN, CREATININE, CALCIUM in the last 72 hours. PT/INR No results for input(s): LABPROT, INR in the last 72 hours. CMP     Component Value Date/Time   NA 133 (L) 12/26/2015 0636   K 4.2 12/26/2015 0636   CL 101 12/26/2015 0636   CO2 28 12/26/2015 0636   GLUCOSE 117 (H) 12/26/2015 0636   BUN 7 12/26/2015 0636   CREATININE 0.76 12/26/2015 0636   CALCIUM 8.1 (L) 12/26/2015 0636   PROT 5.4 (L) 12/26/2015 0636   ALBUMIN 2.1 (L) 12/26/2015 0636   AST 17 12/26/2015 0636   ALT 16 (L) 12/26/2015 0636   ALKPHOS 87 12/26/2015 0636   BILITOT 0.8 12/26/2015 0636   GFRNONAA >60  12/26/2015 0636   GFRAA >60 12/26/2015 0636   Lipase     Component Value Date/Time   LIPASE 44 11/13/2015 2300       Studies/Results: No results found.  Anti-infectives: Anti-infectives    Start     Dose/Rate Route Frequency Ordered Stop   12/25/15 2000  piperacillin-tazobactam (ZOSYN) IVPB 3.375 g     3.375 g 12.5 mL/hr over 240 Minutes Intravenous Every 8 hours 12/25/15 1936     12/25/15 1915  vancomycin (VANCOCIN) 1,216 mg in sodium chloride 0.9 % 500 mL IVPB  Status:  Discontinued     20 mg/kg  60.8 kg 250 mL/hr over 120 Minutes Intravenous  Once 12/25/15 1910 12/25/15 1920   12/25/15 1915  piperacillin-tazobactam (ZOSYN) IVPB 4.5 g  Status:  Discontinued     4.5 g 200 mL/hr over 30 Minutes Intravenous  Once 12/25/15 1910 12/25/15 1920       Assessment/Plan S/p sigmoid diverticulitis with perforation status post sigmoid colectomy and colostomy 07/05/15 Dr. [name] S/p Repair of perforated ulcer, resection of distal stomach With [facility] reconstr.-10/30/15 Dr. Hulen Skains S/p open reduction of hiatal hernia, repair of hiatal hernia, resection of small bowel with, anastomosis placement of gastrostomy tube, placement of duodenostomy tube 11/21/15 Dr. Kieth Brightly.  Hospitalized 8/17-9/08/17 Hospitalized 9/15-9/28/17 Readmitted 12/25/15 with fever of unknown etiology - blood cultures negative - UA negative - urine culture negative - Moderate-sized  left pleural effusion>> consulting IR - TMAX 99.1 over night. WBC down to 5.7 yesterday  FEN: Tube Feeding (goal 65 cc/hr- current rate 83ml/hr) Diet: Full liquids ID: zosyn day 5 DVT: lovenox  Plan: IR consulted to eval for possible aspiration left pleural effusion. Continue zosyn, IS, mobilize. Continue BID wet to dry dressing changes to midline incision/suture abscess. Spoke with nutrition, they will consider increasing TF later today.   LOS: 4 days    Jerrye Beavers , [facility] Surgery 12/29/2015, 9:25  AM Pager: [phone] Consults: [phone] Mon-Fri 7:00 am-4:30 pm Sat-Sun 7:00 am-11:30 am

## 2015-12-30 LAB — CULTURE, BLOOD (ROUTINE X 2)
CULTURE: NO GROWTH
Culture: NO GROWTH

## 2015-12-30 LAB — GLUCOSE, CAPILLARY
GLUCOSE-CAPILLARY: 124 mg/dL — AB (ref 65–99)
GLUCOSE-CAPILLARY: 111 mg/dL — AB (ref 65–99)

## 2015-12-30 NOTE — Progress Notes (Signed)
Patient ID: Miguel [name], male   DOB: Jan 16, 1951, 65 y.o.   MRN: [id number] [facility] Surgery Progress Note     Subjective: Thoracentesis left pleural effusion yesterday with 200cc hazy/amber fluid return, culture pending.  Patient states that he feels well today. Tolerated increase in TF rate. No new complaints.  Objective: Vital signs in last 24 hours: Temp:  [98 F (36.7 C)-99.8 F (37.7 C)] 99.5 F (37.5 C) (10/03 0455) Pulse Rate:  [74-93] 93 (10/03 0455) Resp:  [16] 16 (10/03 0455) BP: (109-114)/(65-73) 111/72 (10/03 0455) SpO2:  [94 %-98 %] 97 % (10/03 0455) Weight:  [136 lb 14.3 oz (62.1 kg)] 136 lb 14.3 oz (62.1 kg) (10/03 0500) Last BM Date: 12/29/15 (ostomy)  Intake/Output from previous day: 10/02 0701 - 10/03 0700 In: 3581.5 [P.O.:600; I.V.:2335; NG/GT:496.5; IV Piggyback:150] Out: 1900 [Urine:1900] Intake/Output this shift: No intake/output data recorded.  PE: General appearance: alert, cooperative, no acute distress Resp: clear to auscultation bilaterally GI: soft, + BS, Both tube sites look good. open area midline incision small than yesterday, no surrounding erythema  Lab Results:   Recent Labs  12/28/15 0515  WBC 5.7  HGB 7.7*  HCT 25.5*  PLT 327   BMET No results for input(s): NA, K, CL, CO2, GLUCOSE, BUN, CREATININE, CALCIUM in the last 72 hours. PT/INR No results for input(s): LABPROT, INR in the last 72 hours. CMP     Component Value Date/Time   NA 133 (L) 12/26/2015 0636   K 4.2 12/26/2015 0636   CL 101 12/26/2015 0636   CO2 28 12/26/2015 0636   GLUCOSE 117 (H) 12/26/2015 0636   BUN 7 12/26/2015 0636   CREATININE 0.76 12/26/2015 0636   CALCIUM 8.1 (L) 12/26/2015 0636   PROT 5.4 (L) 12/26/2015 0636   ALBUMIN 2.1 (L) 12/26/2015 0636   AST 17 12/26/2015 0636   ALT 16 (L) 12/26/2015 0636   ALKPHOS 87 12/26/2015 0636   BILITOT 0.8 12/26/2015 0636   GFRNONAA >60 12/26/2015 0636   GFRAA >60 12/26/2015 0636   Lipase      Component Value Date/Time   LIPASE 44 11/13/2015 2300       Studies/Results: Dg Chest 1 View  Result Date: 12/29/2015 CLINICAL DATA:  Thoracentesis. EXAM: CHEST 1 VIEW COMPARISON:  12/25/2015. FINDINGS: Bibasilar atelectasis. Small left pleural effusion cannot be excluded. No pneumothorax. Heart size normal. Surgical clips left axilla. IMPRESSION: No evidence of pneumothorax post thoracentesis . 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 COMPLICATIONS: None immediate. PROCEDURE: An ultrasound guided thoracentesis was thoroughly discussed with the patient and questions answered. The benefits, risks, alternatives and complications were also discussed. The patient understands and wishes to proceed with the procedure. Written consent was obtained. Ultrasound was performed to localize and mark an adequate pocket of fluid in the left chest. The area was then prepped and draped in the normal sterile fashion. 1% Lidocaine was used for local anesthesia. Under ultrasound guidance a Safe-T-Centesis catheter was introduced. Thoracentesis was performed. The catheter was removed and a dressing applied. FINDINGS: A total of approximately 200 cc of hazy, amber fluid was removed. Samples were sent to the laboratory as requested by the clinical team. IMPRESSION: Successful ultrasound guided  diagnostic and therapeutic left thoracentesis yielding 200 cc of pleural fluid. Read by: Rowe Robert, PA-C Electronically Signed   By: Lucrezia Europe M.D.   On: 12/29/2015 11:49    Anti-infectives: Anti-infectives    Start     Dose/Rate Route Frequency Ordered  Stop   12/25/15 2000  piperacillin-tazobactam (ZOSYN) IVPB 3.375 g     3.375 g 12.5 mL/hr over 240 Minutes Intravenous Every 8 hours 12/25/15 1936     12/25/15 1915  vancomycin (VANCOCIN) 1,216 mg in sodium chloride 0.9 % 500 mL IVPB  Status:  Discontinued     20 mg/kg  60.8 kg 250 mL/hr over 120 Minutes Intravenous  Once 12/25/15 1910 12/25/15 1920   12/25/15 1915  piperacillin-tazobactam (ZOSYN) IVPB 4.5 g  Status:  Discontinued     4.5 g 200 mL/hr over 30 Minutes Intravenous  Once 12/25/15 1910 12/25/15 1920       Assessment/Plan S/p sigmoid diverticulitis with perforation status post sigmoid colectomy and colostomy 07/05/15 Dr. [name] S/p Repair of perforated ulcer, resection of distal stomach With [facility] reconstr.-10/30/15 Dr. Hulen Skains S/p open reduction of hiatal hernia, repair of hiatal hernia, resection of small bowel with, anastomosis placement of gastrostomy tube, placement of duodenostomy tube 11/21/15 Dr. Kieth Brightly.  Hospitalized 8/17-9/08/17 Hospitalized 9/15-9/28/17 Readmitted 12/25/15 with fever of unknown etiology - blood cultures negative - UA negative - urine culture negative - Moderate-sized left pleural effusion s/p thoracentesis, culture pending, gram stain negative - TMAX 99.8 over night  FEN: Tube Feeding (goal 65 cc/hr- current rate 52ml/hr, patient tolerated well yesterday and thinks he could handle additional increase in rate) Diet: Full liquids ID: zosyn day 5 DVT: lovenox  Plan: S/p thoracentesis L pleural effusion, culture pending. Continue zosyn, IS, mobilize. Continue daily wet to dry dressing changes to midline incision/suture abscess. Labs in AM.    LOS: 5 days    Jerrye Beavers , [facility] Surgery 12/30/2015, 7:23 AM Pager: [phone] Consults: [phone] Mon-Fri 7:00 am-4:30 pm Sat-Sun 7:00 am-11:30 am

## 2015-12-31 LAB — BASIC METABOLIC PANEL
Anion gap: 5 (ref 5–15)
BUN: 7 mg/dL (ref 6–20)
CHLORIDE: 99 mmol/L — AB (ref 101–111)
CO2: 26 mmol/L (ref 22–32)
CREATININE: 0.81 mg/dL (ref 0.61–1.24)
Calcium: 7.9 mg/dL — ABNORMAL LOW (ref 8.9–10.3)
GFR calc Af Amer: >60 mL/min (ref >60)
GLUCOSE: 132 mg/dL — AB (ref 65–99)
POTASSIUM: 4.5 mmol/L (ref 3.5–5.1)
SODIUM: 130 mmol/L — AB (ref 135–145)

## 2015-12-31 LAB — GLUCOSE, CAPILLARY
GLUCOSE-CAPILLARY: 123 mg/dL — AB (ref 65–99)
GLUCOSE-CAPILLARY: 129 mg/dL — AB (ref 65–99)
GLUCOSE-CAPILLARY: 355 mg/dL — AB (ref 65–99)

## 2015-12-31 LAB — CBC
HCT: 27 % — ABNORMAL LOW (ref 39.0–52.0)
HEMOGLOBIN: 8.2 g/dL — AB (ref 13.0–17.0)
MCH: 27.1 pg (ref 26.0–34.0)
MCHC: 30.4 g/dL (ref 30.0–36.0)
MCV: 89.1 fL (ref 78.0–100.0)
Platelets: 412 10*3/uL — ABNORMAL HIGH (ref 150–400)
RBC: 3.03 MIL/uL — AB (ref 4.22–5.81)
RDW: 17.7 % — ABNORMAL HIGH (ref 11.5–15.5)
WBC: 10.2 10*3/uL (ref 4.0–10.5)

## 2015-12-31 MED ORDER — OSMOLITE 1.5 CAL PO LIQD
1000.0000 mL | ORAL | Status: DC
  Administered 2015-12-31 – 2016-01-02 (×3): 1000 mL
  Filled 2015-12-31 (×5): qty 1000

## 2015-12-31 NOTE — Progress Notes (Signed)
Nutrition Follow-up  DOCUMENTATION CODES:   Severe malnutrition in context of chronic illness  INTERVENTION:   -D/c Ensure Enlive po BID, each supplement provides 350 kcal and 20 grams of protein -Increase Osmolite 1.5to goal rate of 37ml/hr via PEG over 18 hour period (1600-1000).   Tube feeding regimen provides 1485kcal (83% of needs), 62grams of protein, and 742ml of H2O.   NUTRITION DIAGNOSIS:   Malnutrition related to chronic illness as evidenced by severe depletion of body fat, severe depletion of muscle mass, percent weight loss.  Ongoing  GOAL:   Patient will meet greater than or equal to 90% of their needs  Progressing  MONITOR:   Labs, Diet advancement, Weight trends, TF tolerance, Skin, I & O's  REASON FOR ASSESSMENT:   Malnutrition Screening Tool, Consult Enteral/tube feeding initiation and management  ASSESSMENT:   Patient well-known to our service with previous gastrectomy with BII, Duodenal blowout, Abdominal abscesses, previous colectomy with a colostomy, now here with fevers at home of 102, difficulty urinating and abdominal pain, but no nausea or vomiting.  Spoke with pt at bedside. He is in good spirits and reports he is doing very well. Intake remains minimal, he confirms he only ate some jello this morning.   Pt denies any abdominal pain of nausea. He confirms that he is tolerating Osmolite 1.5 at current rate of 45 ml/hr (which is providing 1245 kcals, 51 grams of protein, and 617 ml fluid, meeting 70% of estimated kcal needs and 60% of estimated protein needs). He reports he think he could tolerate increase in TF rate and is amenable to plan to do so.   Case discussed with Izora Gala, PA, who agrees with plan to increase TF to goal rate of 55 ml/hr. PA reports they are awaiting culture results.   Labs reviewed: Na: 130 (on IV supplementation), CBGS: 111-124.  Diet Order:  Diet full liquid Room service appropriate? Yes; Fluid consistency:  Thin  Skin:  Reviewed, no issues  Last BM:  12/31/15  Height:   Ht Readings from Last 1 Encounters:  12/25/15 5\' 5"  (1.651 m)    Weight:   Wt Readings from Last 1 Encounters:  12/31/15 138 lb 10.7 oz (62.9 kg)    Ideal Body Weight:  61.8 kg  BMI:  Body mass index is 23.08 kg/m.  Estimated Nutritional Needs:   Kcal:  1800-2000  Protein:  85-100 grams  Fluid:  1.8-2.0 L  EDUCATION NEEDS:   No education needs identified at this time  Ayan Yankey A. Jimmye Norman, RD, LDN, CDE Pager: [phone] After hours Pager: [phone]

## 2015-12-31 NOTE — Progress Notes (Signed)
PA made aware of the increased blood sugar. No new order , will monitor.

## 2015-12-31 NOTE — Progress Notes (Signed)
Patient ID: Miguel [name], male   DOB: [birth year], 65 y.o.   MRN: [id number]  [facility] Surgery Progress Note     Subjective: No new complaints. Awaiting final results from thoracentesis.  Objective: Vital signs in last 24 hours: Temp:  [98.6 F (37 C)-101.3 F (38.5 C)] 100.1 F (37.8 C) (10/04 0528) Pulse Rate:  [105] 105 (10/04 0528) Resp:  [18] 18 (10/04 0528) BP: (106-128)/(62-80) 120/71 (10/04 0528) SpO2:  [97 %-99 %] 97 % (10/04 0528) Weight:  [138 lb 10.7 oz (62.9 kg)] 138 lb 10.7 oz (62.9 kg) (10/04 0013) Last BM Date: 12/31/15  Intake/Output from previous day: 10/03 0701 - 10/04 0700 In: 1510 [P.O.:480; I.V.:900; IV Piggyback:100] Out: 3000 [Urine:3000] Intake/Output this shift: Total I/O In: -  Out: 200 [Urine:200]  PE: General appearance: alert, cooperative, no acute distress Resp: clear to auscultation bilaterally GI: soft, + BS, Both tube sites look good. open area midline incision smaller than yesterday, no surrounding erythema  Lab Results:   Recent Labs  12/31/15 0505  WBC 10.2  HGB 8.2*  HCT 27.0*  PLT 412*   BMET  Recent Labs  12/31/15 0505  NA 130*  K 4.5  CL 99*  CO2 26  GLUCOSE 132*  BUN 7  CREATININE 0.81  CALCIUM 7.9*   PT/INR No results for input(s): LABPROT, INR in the last 72 hours. CMP     Component Value Date/Time   NA 130 (L) 12/31/2015 0505   K 4.5 12/31/2015 0505   CL 99 (L) 12/31/2015 0505   CO2 26 12/31/2015 0505   GLUCOSE 132 (H) 12/31/2015 0505   BUN 7 12/31/2015 0505   CREATININE 0.81 12/31/2015 0505   CALCIUM 7.9 (L) 12/31/2015 0505   PROT 5.4 (L) 12/26/2015 0636   ALBUMIN 2.1 (L) 12/26/2015 0636   AST 17 12/26/2015 0636   ALT 16 (L) 12/26/2015 0636   ALKPHOS 87 12/26/2015 0636   BILITOT 0.8 12/26/2015 0636   GFRNONAA >60 12/31/2015 0505   GFRAA >60 12/31/2015 0505   Lipase     Component Value Date/Time   LIPASE 44 11/13/2015 2300       Studies/Results: Dg Chest 1 View  Result  Date: 12/29/2015 CLINICAL DATA:  Thoracentesis. EXAM: CHEST 1 VIEW COMPARISON:  12/25/2015. FINDINGS: Bibasilar atelectasis. Small left pleural effusion cannot be excluded. No pneumothorax. Heart size normal. Surgical clips left axilla. IMPRESSION: No evidence of pneumothorax post thoracentesis . 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 COMPLICATIONS: None immediate. PROCEDURE: An ultrasound guided thoracentesis was thoroughly discussed with the patient and questions answered. The benefits, risks, alternatives and complications were also discussed. The patient understands and wishes to proceed with the procedure. Written consent was obtained. Ultrasound was performed to localize and mark an adequate pocket of fluid in the left chest. The area was then prepped and draped in the normal sterile fashion. 1% Lidocaine was used for local anesthesia. Under ultrasound guidance a Safe-T-Centesis catheter was introduced. Thoracentesis was performed. The catheter was removed and a dressing applied. FINDINGS: A total of approximately 200 cc of hazy, amber fluid was removed. Samples were sent to the laboratory as requested by the clinical team. IMPRESSION: Successful ultrasound guided diagnostic  and therapeutic left thoracentesis yielding 200 cc of pleural fluid. Read by: Rowe Robert, PA-C Electronically Signed   By: Lucrezia Europe M.D.   On: 12/29/2015 11:49    Anti-infectives: Anti-infectives    Start     Dose/Rate Route Frequency Ordered Stop   12/25/15 2000  piperacillin-tazobactam (ZOSYN) IVPB 3.375 g     3.375 g 12.5 mL/hr over 240 Minutes  Intravenous Every 8 hours 12/25/15 1936     12/25/15 1915  vancomycin (VANCOCIN) 1,216 mg in sodium chloride 0.9 % 500 mL IVPB  Status:  Discontinued     20 mg/kg  60.8 kg 250 mL/hr over 120 Minutes Intravenous  Once 12/25/15 1910 12/25/15 1920   12/25/15 1915  piperacillin-tazobactam (ZOSYN) IVPB 4.5 g  Status:  Discontinued     4.5 g 200 mL/hr over 30 Minutes Intravenous  Once 12/25/15 1910 12/25/15 1920       Assessment/Plan S/p sigmoid diverticulitis with perforation status post sigmoid colectomy and colostomy 07/05/15 Dr. [name] S/p Repair of perforated ulcer, resection of distal stomach With [facility] reconstr.-10/30/15 Dr. Hulen Skains S/p open reduction of hiatal hernia, repair of hiatal hernia, resection of small bowel with, anastomosis placement of gastrostomy tube, placement of duodenostomy tube 11/21/15 Dr. Kieth Brightly.  Hospitalized 8/17-9/08/17 Hospitalized 9/15-9/28/17 Readmitted 12/25/15 with fever of unknown etiology - blood cultures negative - UA negative - urine culture negative - Moderate-sized left pleural effusion s/p thoracentesis, preliminary culture report no organisms, gram stain negative - TMAX 100.3over night, WBC 10.2  FEN: Tube Feeding (goal 65 cc/hr- current rate 34ml/hr, patient tolerated well and thinks he could handle additional increase in rate) Diet: Full liquids ID: zosyn day 7 DVT: lovenox  Plan: Culture from thoracentesis L pleural effusion pending; if negative may consider stopping zosyn and switching to PO antibiotic . Continue zosyn for now, IS, mobilize. Continue daily wound care. Spoke with dietary -- they are going to reassess TF rate and either increase to 50 or 55   LOS: 6 days    Jerrye Beavers , [facility] Surgery 12/31/2015, 10:12 AM Pager: [phone] Consults: [phone] Mon-Fri 7:00 am-4:30 pm Sat-Sun 7:00 am-11:30 am

## 2016-01-01 LAB — GLUCOSE, CAPILLARY
GLUCOSE-CAPILLARY: 109 mg/dL — AB (ref 65–99)
GLUCOSE-CAPILLARY: 119 mg/dL — AB (ref 65–99)
Glucose-Capillary: 123 mg/dL — ABNORMAL HIGH (ref 65–99)
Glucose-Capillary: 109 mg/dL — ABNORMAL HIGH (ref 65–99)

## 2016-01-01 MED ORDER — AMOXICILLIN-POT CLAVULANATE 875-125 MG PO TABS: 1.0000 | ORAL_TABLET | Freq: Two times a day (BID) | ORAL | Status: DC

## 2016-01-01 MED ORDER — AMOXICILLIN-POT CLAVULANATE 400-57 MG/5ML PO SUSR
400.0000 mg | Freq: Two times a day (BID) | ORAL | Status: DC
  Administered 2016-01-01 – 2016-01-03 (×5): 400 mg via ORAL
  Filled 2016-01-01 (×5): qty 5

## 2016-01-01 NOTE — Progress Notes (Signed)
Patient ID: Miguel [name], male   DOB: 1951/02/28, 65 y.o.   MRN: [id number]  [facility] Surgery Progress Note     Subjective: Feeling well. Tolerated increase in TF rate to 55. States that he still has no appetite and did not eat anything by mouth yesterday. No fevers over night.  Objective: Vital signs in last 24 hours: Temp:  [97.8 F (36.6 C)-98.8 F (37.1 C)] 98.2 F (36.8 C) (10/05 0446) Pulse Rate:  [91-104] 91 (10/05 0446) Resp:  [17-18] 17 (10/05 0446) BP: (107-108)/(64-69) 107/69 (10/05 0446) SpO2:  [96 %-97 %] 97 % (10/05 0446) Weight:  [139 lb 8.8 oz (63.3 kg)] 139 lb 8.8 oz (63.3 kg) (10/05 0446) Last BM Date: 12/31/15 (colostomy)  Intake/Output from previous day: 10/04 0701 - 10/05 0700 In: 1802.7 [P.O.:300; I.V.:800; NG/GT:652.7; IV Piggyback:50] Out: [id number] [Urine:3200; Stool:75] Intake/Output this shift: No intake/output data recorded.  PE: General appearance: alert, cooperative, no acute distress Resp: clear to auscultation bilaterally GI: soft, + BS, Both tube sites look good. open area midline incision smaller than yesterday, no surrounding erythema or active drainage   Lab Results:   Recent Labs  12/31/15 0505  WBC 10.2  HGB 8.2*  HCT 27.0*  PLT 412*   BMET  Recent Labs  12/31/15 0505  NA 130*  K 4.5  CL 99*  CO2 26  GLUCOSE 132*  BUN 7  CREATININE 0.81  CALCIUM 7.9*   PT/INR No results for input(s): LABPROT, INR in the last 72 hours. CMP     Component Value Date/Time   NA 130 (L) 12/31/2015 0505   K 4.5 12/31/2015 0505   CL 99 (L) 12/31/2015 0505   CO2 26 12/31/2015 0505   GLUCOSE 132 (H) 12/31/2015 0505   BUN 7 12/31/2015 0505   CREATININE 0.81 12/31/2015 0505   CALCIUM 7.9 (L) 12/31/2015 0505   PROT 5.4 (L) 12/26/2015 0636   ALBUMIN 2.1 (L) 12/26/2015 0636   AST 17 12/26/2015 0636   ALT 16 (L) 12/26/2015 0636   ALKPHOS 87 12/26/2015 0636   BILITOT 0.8 12/26/2015 0636   GFRNONAA >60 12/31/2015 0505   GFRAA >60  12/31/2015 0505   Lipase     Component Value Date/Time   LIPASE 44 11/13/2015 2300       Studies/Results: No results found.  Anti-infectives: Anti-infectives    Start     Dose/Rate Route Frequency Ordered Stop   12/25/15 2000  piperacillin-tazobactam (ZOSYN) IVPB 3.375 g     3.375 g 12.5 mL/hr over 240 Minutes Intravenous Every 8 hours 12/25/15 1936     12/25/15 1915  vancomycin (VANCOCIN) 1,216 mg in sodium chloride 0.9 % 500 mL IVPB  Status:  Discontinued     20 mg/kg  60.8 kg 250 mL/hr over 120 Minutes Intravenous  Once 12/25/15 1910 12/25/15 1920   12/25/15 1915  piperacillin-tazobactam (ZOSYN) IVPB 4.5 g  Status:  Discontinued     4.5 g 200 mL/hr over 30 Minutes Intravenous  Once 12/25/15 1910 12/25/15 1920       Assessment/Plan S/p sigmoid diverticulitis with perforation status post sigmoid colectomy and colostomy 07/05/15 Dr. [name] S/p Repair of perforated ulcer, resection of distal stomach With [facility] reconstr.-10/30/15 Dr. Hulen Skains S/p open reduction of hiatal hernia, repair of hiatal hernia, resection of small bowel with, anastomosis placement of gastrostomy tube, placement of duodenostomy tube 11/21/15 Dr. Kieth Brightly.  Hospitalized 8/17-9/08/17 Hospitalized 9/15-9/28/17 Readmitted 12/25/15 with fever of unknown etiology - blood cultures negative - UA negative -  urine culture negative - Moderate-sized left pleural effusion s/p thoracentesis, culture negative - no fevers over night, WBC normal  FEN: Tube Feeding (goal 65 cc/hr- current rate 34ml/hr) Diet: Full liquids ID: 7 days of zosyn complete, augmentin day 1 DVT: lovenox  Plan: Culture from thoracentesis L pleural effusion negative. D/c zosyn and start augmentin BID. Continue daily wound care. Continue TF and encourage PO intake.    LOS: 7 days    Jerrye Beavers , [facility] Surgery 01/01/2016, 7:17 AM Pager: [phone] Consults: [phone] Mon-Fri 7:00 am-4:30 pm Sat-Sun  7:00 am-11:30 am

## 2016-01-02 DIAGNOSIS — T814XXA Infection following a procedure, initial encounter: Secondary | ICD-10-CM | POA: Diagnosis not present

## 2016-01-02 DIAGNOSIS — R6510 Systemic inflammatory response syndrome (SIRS) of non-infectious origin without acute organ dysfunction: Secondary | ICD-10-CM | POA: Diagnosis not present

## 2016-01-02 DIAGNOSIS — L02211 Cutaneous abscess of abdominal wall: Secondary | ICD-10-CM | POA: Diagnosis not present

## 2016-01-02 DIAGNOSIS — R509 Fever, unspecified: Secondary | ICD-10-CM | POA: Diagnosis not present

## 2016-01-02 DIAGNOSIS — R109 Unspecified abdominal pain: Secondary | ICD-10-CM | POA: Diagnosis not present

## 2016-01-02 DIAGNOSIS — I10 Essential (primary) hypertension: Secondary | ICD-10-CM | POA: Diagnosis not present

## 2016-01-02 DIAGNOSIS — J90 Pleural effusion, not elsewhere classified: Secondary | ICD-10-CM | POA: Diagnosis not present

## 2016-01-02 DIAGNOSIS — E860 Dehydration: Secondary | ICD-10-CM | POA: Diagnosis not present

## 2016-01-02 DIAGNOSIS — J9811 Atelectasis: Secondary | ICD-10-CM | POA: Diagnosis not present

## 2016-01-02 LAB — GLUCOSE, CAPILLARY
GLUCOSE-CAPILLARY: 108 mg/dL — AB (ref 65–99)
Glucose-Capillary: 141 mg/dL — ABNORMAL HIGH (ref 65–99)

## 2016-01-02 NOTE — Care Management Note (Signed)
Case Management Note  Patient Details  Name: Miguel [name] MRN: [id number] Date of Birth: 08-03-1950  Subjective/Objective:                    Action/Plan:  Santiago Glad with [facility] aware patient possibility discharging this weekend. Jermaine with [facility] also aware and orders for tube feeding from last admission still in effect no new orders needed.   Expected Discharge Date:                  Expected Discharge Plan:  [place]  In-House Referral:     Discharge planning Services  CM Consult  Post Acute Care Choice:    Choice offered to:  Patient, Adult Children  DME Arranged:    DME Agency:     HH Arranged:  RN [place] Agency:  [facility]  Status of Service:  Completed, signed off  If discussed at [place] of Stay Meetings, dates discussed:    Additional Comments:  Marilu Favre, RN 01/02/2016, 10:43 AM

## 2016-01-02 NOTE — Progress Notes (Addendum)
Abd incision healed now, dressing off, order for drsg change dc'd.

## 2016-01-02 NOTE — Progress Notes (Signed)
[facility] Progress Note     Subjective: Denies fever, abdominal pain, nausea, vomiting. Did tolerate clears and 2 yogurts by mouth yesterday. Urinating without hesitancy. +gas and stool in ostomy pouch. Ambulating.   Objective: Vital signs in last 24 hours: Temp:  [98.2 F (36.8 C)-98.4 F (36.9 C)] 98.2 F (36.8 C) (10/06 0441) Pulse Rate:  [92-101] 94 (10/06 0441) Resp:  [16-18] 16 (10/06 0441) BP: (112-126)/(73-78) 126/78 (10/06 0441) SpO2:  [97 %-98 %] 97 % (10/06 0441) Weight:  [141 lb 1.5 oz (64 kg)] 141 lb 1.5 oz (64 kg) (10/06 0441) Last BM Date: 01/01/16  Intake/Output from previous day: 10/05 0701 - 10/06 0700 In: 1982.9 [I.V.:1300; NG/GT:682.9] Out: 2810 [Urine:2475; Stool:335]  PE: Gen:  Alert, NAD, pleasant and cooperative Abd: Soft, NT/ND, +BS, no HSM, midline incsion C/D/I, g-tube and duodenostomy tube site c/d/i.  Ext:  No erythema, edema, or tenderness, pedal pulses 2+    Lab Results:   Recent Labs  12/31/15 0505  WBC 10.2  HGB 8.2*  HCT 27.0*  PLT 412*   BMET  Recent Labs  12/31/15 0505  NA 130*  K 4.5  CL 99*  CO2 26  GLUCOSE 132*  BUN 7  CREATININE 0.81  CALCIUM 7.9*   CMP     Component Value Date/Time   NA 130 (L) 12/31/2015 0505   K 4.5 12/31/2015 0505   CL 99 (L) 12/31/2015 0505   CO2 26 12/31/2015 0505   GLUCOSE 132 (H) 12/31/2015 0505   BUN 7 12/31/2015 0505   CREATININE 0.81 12/31/2015 0505   CALCIUM 7.9 (L) 12/31/2015 0505   PROT 5.4 (L) 12/26/2015 0636   ALBUMIN 2.1 (L) 12/26/2015 0636   AST 17 12/26/2015 0636   ALT 16 (L) 12/26/2015 0636   ALKPHOS 87 12/26/2015 0636   BILITOT 0.8 12/26/2015 0636   GFRNONAA >60 12/31/2015 0505   GFRAA >60 12/31/2015 0505   Lipase     Component Value Date/Time   LIPASE 44 11/13/2015 2300   Anti-infectives: Anti-infectives    Start     Dose/Rate Route Frequency Ordered Stop   01/01/16 1000  amoxicillin-clavulanate (AUGMENTIN) 875-125 MG per tablet 1 tablet   Status:  Discontinued     1 tablet Oral Every 12 hours 01/01/16 0719 01/01/16 0734   01/01/16 1000  amoxicillin-clavulanate (AUGMENTIN) 400-57 MG/5ML suspension 400 mg     400 mg Oral Every 12 hours 01/01/16 0734     12/25/15 2000  piperacillin-tazobactam (ZOSYN) IVPB 3.375 g  Status:  Discontinued     3.375 g 12.5 mL/hr over 240 Minutes Intravenous Every 8 hours 12/25/15 1936 01/01/16 0719   12/25/15 1915  vancomycin (VANCOCIN) 1,216 mg in sodium chloride 0.9 % 500 mL IVPB  Status:  Discontinued     20 mg/kg  60.8 kg 250 mL/hr over 120 Minutes Intravenous  Once 12/25/15 1910 12/25/15 1920   12/25/15 1915  piperacillin-tazobactam (ZOSYN) IVPB 4.5 g  Status:  Discontinued     4.5 g 200 mL/hr over 30 Minutes Intravenous  Once 12/25/15 1910 12/25/15 1920     Assessment/Plan S/p sigmoid diverticulitis with perforation status post sigmoid colectomy and colostomy 07/05/15 Dr. [name] S/p Repair of perforated ulcer, resection of distal stomach With [facility] reconstr.-10/30/15 Dr. Hulen Skains S/p open reduction of hiatal hernia, repair of hiatal hernia, resection of small bowel with, anastomosis placement of gastrostomy tube, placement of duodenostomy tube 11/21/15 Dr. Kieth Brightly.  Hospitalized 8/17-9/08/17 Hospitalized 9/15-9/28/17  Readmitted 12/25/15 with fever of unknown  etiology - blood cultures negative - UA negative - urine culture negative - Moderate-sized left pleural effusion s/p thoracentesis, culture negative  - no fevers over night, WBC normal  FEN: Tube Feeding (goal 65 cc/hr- current rate 65ml/hr) Diet: Full liquids ID: 7 days of zosyn complete, augmentin day 2  DVT: lovenox  Plan: Continue PO augmentin BID. Continue daily wound care. Continue TF and encourage PO intake.  Will discuss timing of discharge with MD     LOS: 8 days    Jill Alexanders , [facility] Surgery 01/02/2016, 7:14 AM Pager: [phone] Consults: [phone] Mon-Fri 7:00 am-4:30  pm Sat-Sun 7:00 am-11:30 am

## 2016-01-02 NOTE — Progress Notes (Signed)
  Subjective: Comfortable Tolerating tube feeds No fever for over 24 hours  Objective: Vital signs in last 24 hours: Temp:  [98.2 F (36.8 C)-98.4 F (36.9 C)] 98.2 F (36.8 C) (10/06 0441) Pulse Rate:  [92-101] 94 (10/06 0441) Resp:  [16-18] 16 (10/06 0441) BP: (112-126)/(73-78) 126/78 (10/06 0441) SpO2:  [97 %-98 %] 97 % (10/06 0441) Weight:  [64 kg (141 lb 1.5 oz)] 64 kg (141 lb 1.5 oz) (10/06 0441) Last BM Date: 01/01/16  Intake/Output from previous day: 10/05 0701 - 10/06 0700 In: 1982.9 [I.V.:1300; NG/GT:682.9] Out: 2810 [Urine:2475; Stool:335] Intake/Output this shift: No intake/output data recorded.  Exam:  Abdomen soft, tubes stable Wound clean  Lab Results:   Recent Labs  12/31/15 0505  WBC 10.2  HGB 8.2*  HCT 27.0*  PLT 412*   BMET  Recent Labs  12/31/15 0505  NA 130*  K 4.5  CL 99*  CO2 26  GLUCOSE 132*  BUN 7  CREATININE 0.81  CALCIUM 7.9*   PT/INR No results for input(s): LABPROT, INR in the last 72 hours. ABG No results for input(s): PHART, HCO3 in the last 72 hours.  Invalid input(s): PCO2, PO2  Studies/Results: No results found.  Anti-infectives: Anti-infectives    Start     Dose/Rate Route Frequency Ordered Stop   01/01/16 1000  amoxicillin-clavulanate (AUGMENTIN) 875-125 MG per tablet 1 tablet  Status:  Discontinued     1 tablet Oral Every 12 hours 01/01/16 0719 01/01/16 0734   01/01/16 1000  amoxicillin-clavulanate (AUGMENTIN) 400-57 MG/5ML suspension 400 mg     400 mg Oral Every 12 hours 01/01/16 0734     12/25/15 2000  piperacillin-tazobactam (ZOSYN) IVPB 3.375 g  Status:  Discontinued     3.375 g 12.5 mL/hr over 240 Minutes Intravenous Every 8 hours 12/25/15 1936 01/01/16 0719   12/25/15 1915  vancomycin (VANCOCIN) 1,216 mg in sodium chloride 0.9 % 500 mL IVPB  Status:  Discontinued     20 mg/kg  60.8 kg 250 mL/hr over 120 Minutes Intravenous  Once 12/25/15 1910 12/25/15 1920   12/25/15 1915  piperacillin-tazobactam  (ZOSYN) IVPB 4.5 g  Status:  Discontinued     4.5 g 200 mL/hr over 30 Minutes Intravenous  Once 12/25/15 1910 12/25/15 1920      Assessment/Plan: s/p * No surgery found *  Fevers improved after thoracentesis.  Cultures remain negative.  Given improvement with antibiotics, will keep on Augmentin at discharge. Continuing tube feeds. Potential discharge in the next 24 to 48 hours if he remains stable  LOS: 8 days    Nasire Reali A 01/02/2016

## 2016-01-03 LAB — CULTURE, BODY FLUID-BOTTLE: CULTURE: NO GROWTH

## 2016-01-03 LAB — GLUCOSE, CAPILLARY
GLUCOSE-CAPILLARY: 121 mg/dL — AB (ref 65–99)
Glucose-Capillary: 150 mg/dL — ABNORMAL HIGH (ref 65–99)

## 2016-01-03 LAB — CULTURE, BODY FLUID W GRAM STAIN -BOTTLE

## 2016-01-03 MED ORDER — TAMSULOSIN HCL 0.4 MG PO CAPS: 0.4000 mg | ORAL_CAPSULE | Freq: Every day | ORAL | 1 refills | Status: AC

## 2016-01-03 MED ORDER — AMOXICILLIN-POT CLAVULANATE 400-57 MG/5ML PO SUSR: 400.0000 mg | Freq: Two times a day (BID) | ORAL | 0 refills | Status: DC

## 2016-01-03 NOTE — Discharge Planning (Signed)
AVS AND RX TO PATIENT WHO VERBALIZES UNDERSTANDING. DCD TO PRIVATE CAR WITH ALL PERSONAL BELONGINGS, ACCOMPANIED BY WIFE AT 1125.

## 2016-01-03 NOTE — Progress Notes (Signed)
  Subjective: Doing well.  Ambulating in hall independently.  Tolerating tube feedings.  No shortness of breath. He and his wife are asking if he can go home today.  I told him that was reasonable.    Objective: Vital signs in last 24 hours: Temp:  [97.9 F (36.6 C)-98.6 F (37 C)] 98.6 F (37 C) (10/07 0625) Pulse Rate:  [89-96] 96 (10/07 0625) Resp:  [16-18] 18 (10/07 0625) BP: (118-137)/(71-81) 132/76 (10/07 0625) SpO2:  [97 %-99 %] 99 % (10/07 0625) Weight:  [64 kg (141 lb)] 64 kg (141 lb) (10/07 0500) Last BM Date: 01/03/16  Intake/Output from previous day: 10/06 0701 - 10/07 0700 In: 3313 [P.O.:120; I.V.:2193; NG/GT:1000] Out: 2775 [Urine:2725; Stool:50] Intake/Output this shift: Total I/O In: -  Out: 450 [Urine:450]  General appearance: Alert.  Cooperative.  No distress.  Mental status normal. GI: Abdomen soft and nontender.  Feeding tube left upper quadrant.  Gastrostomy tube right upper quadrant.  Drain sites clean.  Midline wound looks good.  Lab Results:  No results for input(s): WBC, HGB, HCT, PLT in the last 72 hours. BMET No results for input(s): NA, K, CL, CO2, GLUCOSE, BUN, CREATININE, CALCIUM in the last 72 hours. PT/INR No results for input(s): LABPROT, INR in the last 72 hours. ABG No results for input(s): PHART, HCO3 in the last 72 hours.  Invalid input(s): PCO2, PO2  Studies/Results: No results found.  Anti-infectives: Anti-infectives    Start     Dose/Rate Route Frequency Ordered Stop   01/03/16 0000  amoxicillin-clavulanate (AUGMENTIN) 400-57 MG/5ML suspension     400 mg Oral Every 12 hours 01/03/16 1104     01/01/16 1000  amoxicillin-clavulanate (AUGMENTIN) 875-125 MG per tablet 1 tablet  Status:  Discontinued     1 tablet Oral Every 12 hours 01/01/16 0719 01/01/16 0734   01/01/16 1000  amoxicillin-clavulanate (AUGMENTIN) 400-57 MG/5ML suspension 400 mg     400 mg Oral Every 12 hours 01/01/16 0734     12/25/15 2000   piperacillin-tazobactam (ZOSYN) IVPB 3.375 g  Status:  Discontinued     3.375 g 12.5 mL/hr over 240 Minutes Intravenous Every 8 hours 12/25/15 1936 01/01/16 0719   12/25/15 1915  vancomycin (VANCOCIN) 1,216 mg in sodium chloride 0.9 % 500 mL IVPB  Status:  Discontinued     20 mg/kg  60.8 kg 250 mL/hr over 120 Minutes Intravenous  Once 12/25/15 1910 12/25/15 1920   12/25/15 1915  piperacillin-tazobactam (ZOSYN) IVPB 4.5 g  Status:  Discontinued     4.5 g 200 mL/hr over 30 Minutes Intravenous  Once 12/25/15 1910 12/25/15 1920      Assessment/Plan:  Fevers improved after thoracentesis.  Cultures are negative Meets discharge criteria from surgical standpoint I have printed at a prescription for Augmentin and for Flomax Otherwise he has everything he needs at home including pain medicines and tube feeds which he cycles at night I asked him to call Monday and make an appointment to see Dr. [name] in one week  Discharge if okay with internal medicine service. I have written discharge orders.   LOS: 9 days    Rhythm Gubbels M 01/03/2016

## 2016-01-04 DIAGNOSIS — Z48815 Encounter for surgical aftercare following surgery on the digestive system: Secondary | ICD-10-CM | POA: Diagnosis not present

## 2016-01-04 DIAGNOSIS — K5793 Diverticulitis of intestine, part unspecified, without perforation or abscess with bleeding: Secondary | ICD-10-CM | POA: Diagnosis not present

## 2016-01-04 DIAGNOSIS — I10 Essential (primary) hypertension: Secondary | ICD-10-CM | POA: Diagnosis not present

## 2016-01-04 NOTE — Discharge Summary (Signed)
Patient ID: Miguel [name] [id number] y.o. Nov 05, 1950  Admit date: 12/25/2015  Discharge date and time: 01/03/2016 11:28 AM  Admitting Physician:   Discharge Physician: Adin Hector  Admission Diagnoses: Abdominal pain [R10.9]  Discharge Diagnoses: Abdominal pain, nonmalignant                                         Fevers of unknown etiology                                         Left pleural effusion, culture negative                                          History diverticulitis with perforation and colectomy with colostomy 07/05/2015                                         History repair of perforated peptic ulcer, antrectomy with Billroth II reconstruction, 10/30/2015, Dr.                                               Hulen Skains                                          History reoperation for open reduction of hiatal hernia, repair of hiatal hernia, resection of small bowel with anastomosis, placement of gastrostomy tube, placement of duodenostomy tube 11/21/2015, Dr. Kieth Brightly.                                    Operations: Left thoracentesis  Admission Condition: fair  Discharged Condition: good  Indication for Admission: This is a 65 year old Caucasian male with a complex surgical history.  Well-known to our service.  Previous Hartmann resection for perforated diverticulitis.  Months ago.  More recently perforated peptic ulcer requiring antrectomy and Billroth II anastomosis.  Postop course complicated by anastomotic leak due to gastrojejunostomy following back into a large hiatal hernia.  This required repair.  Postoperative abdominal abscesses were drained.  He did recover and went home.     He was readmitted at this time with fever to 102, difficulty urinating and abdominal pain but without nausea and vomiting.  Hospital Course: The patient was admitted for pain control, nausea control, and IV antibiotics.  CT scan of chest abdomen and pelvis showed no significant  change indication of obvious source of fever.  Chest x-ray notable for effusions.  Fever curve and leukocytosis improved after 24 hours of antibiotics.  We were able to restart jejunal tube feedings without any difficulty.  Abdominal wound looked fine.     All of his cultures came back negative and he was stabilizing.  We chose to ask interventional radiology to drain the pleural effusion.  They removed 200 mL of hazy amber-colored  fluid.  Cultures were negative.  Foley catheter was removed.  He was voiding reasonably well.  He tolerated his tube feedings and some yogurt by mouth.  Medications were resumed.  He was passing gas and stool in his ostomy pouch.  He was ambulating in the halls independently. ,   On the day of discharge she felt ready to go home.  He was afebrile.  All cultures negative.  We discharged him on Augmentin and Flomax.  The Flomax had helped him a lot.  He continued his usual tube feeding regimen.  He was asked to call the office Monday and make an appointment see Dr. [name] in one week.  Consults: None  Significant Diagnostic Studies: Imaging studies  Treatments: Left thoracentesis  Disposition: Home  Patient Instructions:    Medication List    TAKE these medications   acetaminophen 500 MG tablet Commonly known as:  TYLENOL Take 1 tablet (500 mg total) by mouth every 8 (eight) hours as needed for mild pain (for pain).   amoxicillin-clavulanate 400-57 MG/5ML suspension Commonly known as:  AUGMENTIN Take 5 mLs (400 mg total) by mouth every 12 (twelve) hours.   aspirin EC 81 MG tablet Take 81 mg by mouth daily after supper.   famotidine 20 MG tablet Commonly known as:  PEPCID Take 1 tablet (20 mg total) by mouth 2 (two) times daily.   ferrous sulfate 325 (65 FE) MG tablet Take 325 mg by mouth daily.   Fish Oil 1000 MG Cpdr Take 1,000 mg by mouth daily.   metoCLOPramide 5 MG/5ML solution Commonly known as:  REGLAN Place 5 mLs (5 mg total) into  feeding tube 3 (three) times daily before meals.   multivitamin with minerals Tabs tablet Take 1 tablet by mouth daily.   ondansetron 4 MG tablet Commonly known as:  ZOFRAN Take 4 mg by mouth every 8 (eight) hours as needed for nausea or vomiting.   oxyCODONE 5 MG immediate release tablet Commonly known as:  Oxy IR/ROXICODONE Take 1-2 tablets (5-10 mg total) by mouth every 4 (four) hours as needed for moderate pain, severe pain or breakthrough pain.   promethazine 25 MG tablet Commonly known as:  PHENERGAN Take 25 mg by mouth every 6 (six) hours as needed for nausea or vomiting.   tamsulosin 0.4 MG Caps capsule Commonly known as:  FLOMAX Take 1 capsule (0.4 mg total) by mouth daily.   zolpidem 10 MG tablet Commonly known as:  AMBIEN Take 5-10 mg by mouth at bedtime as needed for sleep.       Activity: activity as tolerated Diet: Continue oral diet as tolerated.  Continued cycled jejunal tube feedings at night. Wound Care: as directed  Follow-up:  With Dr. [name] in 1 week.  Signed: Edsel Petrin. Dalbert Batman, M.D., FACS General and minimally invasive surgery Breast and Colorectal Surgery  01/04/2016, 12:23 PM

## 2016-01-05 ENCOUNTER — Other Ambulatory Visit: Payer: Self-pay | Admitting: *Deleted

## 2016-01-05 NOTE — Patient Outreach (Addendum)
[place] [facility]) Care Management  01/05/2016  Miguel [name] May 24, 1950 [id number]   Subjective: Telephone call to patient's home number, no answer, left HIPAA compliant voicemail message, and requested call back. Telephone call from patient's wife, states she is returning call for patient, Wife stated patient's name, date of birth, and address.   RNCM also spoke with patient, received verbal authorization from patient to speak with wife and daughter Miguel [name] regarding his healthcare as needed. Patient has requested that [facility] call patient's wife on mobile phone [phone]) for updates.   Patient states he is doing fair and making progress since discharge.   Wife states patient is doing much better, has no fever, and tolerating liquids the same as in the hospital (sips of water and juice).  Wife will follow up with surgeon on 01/06/16 to clarify type of liquids (full or clear) since patient vomited on 01/04/16 after drinking a few sips of vanilla milkshake.   Wife states he is tolerating  17 hours of tube feedings daily (approximately 4 pm - 10am) and continues to have no appetite.  Wife states patient has a follow up appointment with surgeon (Dr. Ninfa [place]) on 01/09/16.   RNCM educated patient's wife on the importance of hospital follow up with primary MD, voices understanding, and states she will call on 01/06/16 to schedule an appointment with primary MD.  Patient continues to receive home health services through [facility] and RN came for initial visit on 01/04/16.   Wife states patient able to shower with assistance and ambulates independently outside of home walking down the street,   States patient does not have any EMMI educational needs at this time.   States patient in agreement with receiving successful outreach letter and still Link to [employer].   Patient will continue to receive [facility] Care Management UMR Transition of care follow up.   Objective:  Per chart review: Patient hospitalized 12/25/15 - 01/03/16 for fever, difficulty urinating,  and abdominal pain.  Patient hospitalized  12/12/15 - 12/25/15 for dehydration and malnutrition.  Patient hospitalized 11/13/15 -12/05/15 with Postoperative fever, acute kidney injury, severe protein malnutrition, and sepsis. Status post Open reduction of hiatal hernia repair of hiatal hernia, resection of small bowel with anastomosis,placement of gastrostomy tube, placement of duodenostomy tube on 11/21/15.  Patient hospitalized 10/27/15 - 11/09/15 for GI bleed, hiatal hernia, acute blood loss anemia, and gastric ulcer. Patient has a history of diverticulosis with perforation, hyperlipidemia, and chronic kidney disease. Patient status post REPAIR OF BLEEDING ULCER PARTIAL DISTAL GASTRECTOMY WITH BILROTH II RECONSTRUCTION/GASTROJEJUNOSTOMY on 10/30/15. Ostomy reversal pending.   Assessment: Received UMR Transition of care referral on 12/15/15. Transition of care screening / follow up completed.   Patient will receive ongoing transition of care follow up.   Plan: RNCM will send patient successful outreach letter.   RNCM will call patient's wife for transition of care follow up within 10 business days. RNCM will follow up with patient's wife within 10 business days,  on scheduling appointment with primary MD, outcome of follow up appointment with surgeon (Dr. Ninfa [place]), and diet clarification.   Miguel [name], BSN, [place] [facility] Telephonic CM Phone: [phone] Fax: [phone]

## 2016-01-06 ENCOUNTER — Encounter: Payer: Self-pay | Admitting: *Deleted

## 2016-01-06 DIAGNOSIS — I10 Essential (primary) hypertension: Secondary | ICD-10-CM | POA: Diagnosis not present

## 2016-01-06 DIAGNOSIS — K5793 Diverticulitis of intestine, part unspecified, without perforation or abscess with bleeding: Secondary | ICD-10-CM | POA: Diagnosis not present

## 2016-01-06 DIAGNOSIS — Z48815 Encounter for surgical aftercare following surgery on the digestive system: Secondary | ICD-10-CM | POA: Diagnosis not present

## 2016-01-08 DIAGNOSIS — I10 Essential (primary) hypertension: Secondary | ICD-10-CM | POA: Diagnosis not present

## 2016-01-08 DIAGNOSIS — K5720 Diverticulitis of large intestine with perforation and abscess without bleeding: Secondary | ICD-10-CM | POA: Diagnosis not present

## 2016-01-08 DIAGNOSIS — Z48815 Encounter for surgical aftercare following surgery on the digestive system: Secondary | ICD-10-CM | POA: Diagnosis not present

## 2016-01-08 DIAGNOSIS — K5793 Diverticulitis of intestine, part unspecified, without perforation or abscess with bleeding: Secondary | ICD-10-CM | POA: Diagnosis not present

## 2016-01-08 DIAGNOSIS — N179 Acute kidney failure, unspecified: Secondary | ICD-10-CM | POA: Diagnosis not present

## 2016-01-12 ENCOUNTER — Ambulatory Visit: Payer: Self-pay | Admitting: *Deleted

## 2016-01-12 ENCOUNTER — Other Ambulatory Visit: Payer: Self-pay | Admitting: *Deleted

## 2016-01-12 NOTE — Patient Outreach (Addendum)
[facility]) Care Management  01/12/2016  ANDI MACBRIDE [birth year] [id number]   Subjective: Telephone call to patient's wife (Diane) per patient's previous verbal authorization, spoke with wife, and wife verified HIPAA.   Wife states patient is doing well and follow up appointment with surgeon on 01/09/16 went great.  MD clarified diet will remain clear liquids and added additional 4 oz of bolus tube feeding everyday in between evening feeding to assist patient with maintaining weight.   Patient is taking jello, juice, and water by mouth.   MD does not feel like patient's esophagus is really for more than clear liquids at this point in time.  States patient's current weight is around 120 pounds and had lost approximately  1 pound since hospital discharge.   Wife states patient's energy is better and he knows when to slow down.  Patient continues to ambulate outside and has increased walking distance to around the block.  States patient went fishing with son-in-law today, is doing very well,  and has come a long ways in the last 3 weeks.  Wife states patient does not have any transition of care, care coordination, disease management, disease monitoring, transportation, community resource, or pharmacy needs at this time.   Wife states she is very appreciative of the follow up calls and will call RNCM if assistance needed in the future.   Telephone call to patient's wife and HIPAA verified.   States she called primary MD's office to set up a follow up appointment and was advised that MD did not need to see patient at this time.   Objective: Per chart review: Patient hospitalized 12/25/15 - 01/03/16 for fever, difficulty urinating,  and abdominal pain.  Patient hospitalized  12/12/15 - 12/25/15 for dehydration and malnutrition. Patient hospitalized 11/13/15 -12/05/15 with Postoperative fever, acute kidney injury, severe protein malnutrition, and sepsis. Status post Open reduction of hiatal  hernia repair of hiatal hernia, resection of small bowel with anastomosis,placement of gastrostomy tube, placement of duodenostomy tube on 11/21/15.  Patient hospitalized 10/27/15 - 11/09/15 for GI bleed, hiatal hernia, acute blood loss anemia, and gastric ulcer. Patient has a history of diverticulosis with perforation, hyperlipidemia, and chronic kidney disease. Patient status post REPAIR OF BLEEDING ULCER PARTIAL DISTAL GASTRECTOMY WITH BILROTH II RECONSTRUCTION/GASTROJEJUNOSTOMY on 10/30/15. Ostomy reversal pending.   Assessment: Received UMR Transition of care referral on 12/15/15. Transition of care screening / follow up completed.   Patient has no care management needs at this time and will proceed with case closure.   Plan:RNCM will send case closure due to follow up completed / no care management needs request to [facility].    Jenai Scaletta H. Annia Friendly, BSN, [place] [facility] Telephonic CM Phone: [phone] Fax: [phone]

## 2016-01-15 DIAGNOSIS — I10 Essential (primary) hypertension: Secondary | ICD-10-CM | POA: Diagnosis not present

## 2016-01-15 DIAGNOSIS — Z48815 Encounter for surgical aftercare following surgery on the digestive system: Secondary | ICD-10-CM | POA: Diagnosis not present

## 2016-01-15 DIAGNOSIS — E43 Unspecified severe protein-calorie malnutrition: Secondary | ICD-10-CM | POA: Diagnosis not present

## 2016-01-15 DIAGNOSIS — Z9884 Bariatric surgery status: Secondary | ICD-10-CM | POA: Diagnosis not present

## 2016-01-15 DIAGNOSIS — K5793 Diverticulitis of intestine, part unspecified, without perforation or abscess with bleeding: Secondary | ICD-10-CM | POA: Diagnosis not present

## 2016-01-19 DIAGNOSIS — K5793 Diverticulitis of intestine, part unspecified, without perforation or abscess with bleeding: Secondary | ICD-10-CM | POA: Diagnosis not present

## 2016-01-19 DIAGNOSIS — Z48815 Encounter for surgical aftercare following surgery on the digestive system: Secondary | ICD-10-CM | POA: Diagnosis not present

## 2016-01-19 DIAGNOSIS — I10 Essential (primary) hypertension: Secondary | ICD-10-CM | POA: Diagnosis not present

## 2016-01-22 DIAGNOSIS — I10 Essential (primary) hypertension: Secondary | ICD-10-CM | POA: Diagnosis not present

## 2016-01-22 DIAGNOSIS — K5793 Diverticulitis of intestine, part unspecified, without perforation or abscess with bleeding: Secondary | ICD-10-CM | POA: Diagnosis not present

## 2016-01-22 DIAGNOSIS — Z48815 Encounter for surgical aftercare following surgery on the digestive system: Secondary | ICD-10-CM | POA: Diagnosis not present

## 2016-01-23 DIAGNOSIS — K5720 Diverticulitis of large intestine with perforation and abscess without bleeding: Secondary | ICD-10-CM | POA: Diagnosis not present

## 2016-01-23 DIAGNOSIS — N179 Acute kidney failure, unspecified: Secondary | ICD-10-CM | POA: Diagnosis not present

## 2016-01-25 DIAGNOSIS — N179 Acute kidney failure, unspecified: Secondary | ICD-10-CM | POA: Diagnosis not present

## 2016-01-25 DIAGNOSIS — K5720 Diverticulitis of large intestine with perforation and abscess without bleeding: Secondary | ICD-10-CM | POA: Diagnosis not present

## 2016-01-27 DIAGNOSIS — Z48815 Encounter for surgical aftercare following surgery on the digestive system: Secondary | ICD-10-CM | POA: Diagnosis not present

## 2016-01-27 DIAGNOSIS — I10 Essential (primary) hypertension: Secondary | ICD-10-CM | POA: Diagnosis not present

## 2016-01-27 DIAGNOSIS — K5793 Diverticulitis of intestine, part unspecified, without perforation or abscess with bleeding: Secondary | ICD-10-CM | POA: Diagnosis not present

## 2016-01-29 DIAGNOSIS — I10 Essential (primary) hypertension: Secondary | ICD-10-CM | POA: Diagnosis not present

## 2016-01-29 DIAGNOSIS — Z48815 Encounter for surgical aftercare following surgery on the digestive system: Secondary | ICD-10-CM | POA: Diagnosis not present

## 2016-01-29 DIAGNOSIS — K5793 Diverticulitis of intestine, part unspecified, without perforation or abscess with bleeding: Secondary | ICD-10-CM | POA: Diagnosis not present

## 2016-02-03 DIAGNOSIS — Z48815 Encounter for surgical aftercare following surgery on the digestive system: Secondary | ICD-10-CM | POA: Diagnosis not present

## 2016-02-03 DIAGNOSIS — I10 Essential (primary) hypertension: Secondary | ICD-10-CM | POA: Diagnosis not present

## 2016-02-03 DIAGNOSIS — K5793 Diverticulitis of intestine, part unspecified, without perforation or abscess with bleeding: Secondary | ICD-10-CM | POA: Diagnosis not present

## 2016-02-05 DIAGNOSIS — I10 Essential (primary) hypertension: Secondary | ICD-10-CM | POA: Diagnosis not present

## 2016-02-05 DIAGNOSIS — Z48815 Encounter for surgical aftercare following surgery on the digestive system: Secondary | ICD-10-CM | POA: Diagnosis not present

## 2016-02-05 DIAGNOSIS — K5793 Diverticulitis of intestine, part unspecified, without perforation or abscess with bleeding: Secondary | ICD-10-CM | POA: Diagnosis not present

## 2016-02-06 ENCOUNTER — Other Ambulatory Visit: Payer: Self-pay | Admitting: *Deleted

## 2016-02-06 NOTE — Patient Outreach (Addendum)
[facility] [facility]) Care Management  02/06/2016  BRETT ILLES Apr 22, 1950 [id number]   Subjective:   Received voicemail message from Ronny Bacon Social Worker Navigator at [employer] [phone] x [phone] or [phone]), states patient has been discharged from homecare services on 02/05/16.    Objective: Per chart review: Patient hospitalized 12/25/15 - 01/03/16 for fever, difficulty urinating, and abdominal pain. Patient hospitalized 12/12/15 - 9/28/17for dehydration and malnutrition. Patient hospitalized 11/13/15 -12/05/15 with Postoperative fever, acute kidney injury, severe protein malnutrition, and sepsis. Status post Open reduction of hiatal hernia repair of hiatal hernia, resection of small bowel with anastomosis,placement of gastrostomy tube, placement of duodenostomy tube on 11/21/15.  Patient hospitalized 10/27/15 - 11/09/15 for GI bleed, hiatal hernia, acute blood loss anemia, and gastric ulcer. Patient has a history of diverticulosis with perforation, hyperlipidemia, and chronic kidney disease. Patient status post REPAIR OF BLEEDING ULCER PARTIAL DISTAL GASTRECTOMY WITH BILROTH II RECONSTRUCTION/GASTROJEJUNOSTOMY on 10/30/15. Ostomy reversal pending.   Assessment: Received UMR Transition of care referral on 12/15/15. Transition of care screening / follow up completed. Patient has no care management needs at this time and case will remain closed.   Plan:Case remains closed no care management needs at this time.   Marque Bango H. Annia Friendly, BSN, [place] Management [facility] Telephonic CM Phone: [phone] Fax: [phone]

## 2016-02-23 DIAGNOSIS — N179 Acute kidney failure, unspecified: Secondary | ICD-10-CM | POA: Diagnosis not present

## 2016-02-23 DIAGNOSIS — K5720 Diverticulitis of large intestine with perforation and abscess without bleeding: Secondary | ICD-10-CM | POA: Diagnosis not present

## 2016-02-24 DIAGNOSIS — K5720 Diverticulitis of large intestine with perforation and abscess without bleeding: Secondary | ICD-10-CM | POA: Diagnosis not present

## 2016-02-24 DIAGNOSIS — N179 Acute kidney failure, unspecified: Secondary | ICD-10-CM | POA: Diagnosis not present

## 2016-02-26 DIAGNOSIS — Z9884 Bariatric surgery status: Secondary | ICD-10-CM | POA: Diagnosis not present

## 2016-03-08 DIAGNOSIS — R350 Frequency of micturition: Secondary | ICD-10-CM | POA: Diagnosis not present

## 2016-03-08 DIAGNOSIS — Z23 Encounter for immunization: Secondary | ICD-10-CM | POA: Diagnosis not present

## 2016-03-08 DIAGNOSIS — R630 Anorexia: Secondary | ICD-10-CM | POA: Diagnosis not present

## 2016-03-08 DIAGNOSIS — M25512 Pain in left shoulder: Secondary | ICD-10-CM | POA: Diagnosis not present

## 2016-03-08 DIAGNOSIS — R972 Elevated prostate specific antigen [PSA]: Secondary | ICD-10-CM | POA: Diagnosis not present

## 2016-03-08 DIAGNOSIS — E7800 Pure hypercholesterolemia, unspecified: Secondary | ICD-10-CM | POA: Diagnosis not present

## 2016-03-08 DIAGNOSIS — G4700 Insomnia, unspecified: Secondary | ICD-10-CM | POA: Diagnosis not present

## 2016-03-24 DIAGNOSIS — N179 Acute kidney failure, unspecified: Secondary | ICD-10-CM | POA: Diagnosis not present

## 2016-03-24 DIAGNOSIS — K5720 Diverticulitis of large intestine with perforation and abscess without bleeding: Secondary | ICD-10-CM | POA: Diagnosis not present

## 2016-04-01 DIAGNOSIS — L929 Granulomatous disorder of the skin and subcutaneous tissue, unspecified: Secondary | ICD-10-CM | POA: Diagnosis not present

## 2016-04-07 DIAGNOSIS — L929 Granulomatous disorder of the skin and subcutaneous tissue, unspecified: Secondary | ICD-10-CM | POA: Diagnosis not present

## 2016-04-16 DIAGNOSIS — Z933 Colostomy status: Secondary | ICD-10-CM | POA: Diagnosis not present

## 2016-04-22 DIAGNOSIS — K5720 Diverticulitis of large intestine with perforation and abscess without bleeding: Secondary | ICD-10-CM | POA: Diagnosis not present

## 2016-04-22 DIAGNOSIS — N179 Acute kidney failure, unspecified: Secondary | ICD-10-CM | POA: Diagnosis not present

## 2016-05-05 DIAGNOSIS — L929 Granulomatous disorder of the skin and subcutaneous tissue, unspecified: Secondary | ICD-10-CM | POA: Diagnosis not present

## 2016-05-05 DIAGNOSIS — E43 Unspecified severe protein-calorie malnutrition: Secondary | ICD-10-CM | POA: Diagnosis not present

## 2016-05-05 DIAGNOSIS — Z9049 Acquired absence of other specified parts of digestive tract: Secondary | ICD-10-CM | POA: Diagnosis not present

## 2016-05-07 MED FILL — TAMSULOSIN HCL 0.4 MG CAP: 0.4 | 30 days supply | Qty: 30 | Fill #0

## 2016-05-21 ENCOUNTER — Other Ambulatory Visit: Payer: Self-pay | Admitting: Surgery

## 2016-05-21 DIAGNOSIS — Z9049 Acquired absence of other specified parts of digestive tract: Secondary | ICD-10-CM | POA: Diagnosis not present

## 2016-05-21 MED FILL — FAMOTIDINE 20 MG TABLET: 20 | 30 days supply | Qty: 120 | Fill #0

## 2016-05-24 DIAGNOSIS — N179 Acute kidney failure, unspecified: Secondary | ICD-10-CM | POA: Diagnosis not present

## 2016-05-24 DIAGNOSIS — K5720 Diverticulitis of large intestine with perforation and abscess without bleeding: Secondary | ICD-10-CM | POA: Diagnosis not present

## 2016-05-25 ENCOUNTER — Ambulatory Visit (INDEPENDENT_AMBULATORY_CARE_PROVIDER_SITE_OTHER): Payer: 59 | Admitting: Orthopaedic Surgery

## 2016-05-25 ENCOUNTER — Ambulatory Visit (INDEPENDENT_AMBULATORY_CARE_PROVIDER_SITE_OTHER): Payer: Self-pay

## 2016-05-25 DIAGNOSIS — M65312 Trigger thumb, left thumb: Secondary | ICD-10-CM

## 2016-05-25 DIAGNOSIS — M7542 Impingement syndrome of left shoulder: Secondary | ICD-10-CM | POA: Diagnosis not present

## 2016-05-25 DIAGNOSIS — M65322 Trigger finger, left index finger: Secondary | ICD-10-CM | POA: Diagnosis not present

## 2016-05-25 DIAGNOSIS — K5720 Diverticulitis of large intestine with perforation and abscess without bleeding: Secondary | ICD-10-CM | POA: Diagnosis not present

## 2016-05-25 DIAGNOSIS — M25512 Pain in left shoulder: Secondary | ICD-10-CM | POA: Diagnosis not present

## 2016-05-25 DIAGNOSIS — M79642 Pain in left hand: Secondary | ICD-10-CM

## 2016-05-25 DIAGNOSIS — N179 Acute kidney failure, unspecified: Secondary | ICD-10-CM | POA: Diagnosis not present

## 2016-05-25 MED ORDER — METHYLPREDNISOLONE ACETATE 40 MG/ML IJ SUSP
40.0000 mg | INTRAMUSCULAR | Status: AC | PRN
  Administered 2016-05-25: 40 mg

## 2016-05-25 MED ORDER — LIDOCAINE HCL 1 % IJ SOLN
1.0000 mL | INTRAMUSCULAR | Status: AC | PRN
  Administered 2016-05-25: 1 mL

## 2016-05-25 MED ORDER — LIDOCAINE HCL 1 % IJ SOLN
3.0000 mL | INTRAMUSCULAR | Status: AC | PRN
  Administered 2016-05-25: 3 mL

## 2016-05-25 MED ORDER — METHYLPREDNISOLONE ACETATE 40 MG/ML IJ SUSP
40.0000 mg | INTRAMUSCULAR | Status: AC | PRN
  Administered 2016-05-25: 40 mg via INTRA_ARTICULAR

## 2016-05-25 NOTE — Progress Notes (Signed)
Office Visit Note   Patient: Miguel [name]           Date of Birth: June 12, 1950           MRN: [id number] Visit Date: 05/25/2016              Requested by: L.Donnie Coffin, MD [place] [employer] [place], [place] [address] PCP: Donnie Coffin, MD   Assessment & Plan: Visit Diagnoses:  1. Pain in left hand   2. Acute pain of left shoulder   3. Trigger thumb, left thumb   4. Trigger index finger of left hand   5. Impingement syndrome of left shoulder     Plan: He tolerated the left shoulder injection and hand injections well. We'll see him back in 6 weeks to see how is doing overall.  Follow-Up Instructions: Return in about 6 weeks (around 07/06/2016).   Orders:  Orders Placed This Encounter  Procedures  . Large Joint Injection/Arthrocentesis  . Hand/Upper Extremity Injection/Arthrocentesis  . Hand/Upper Extremity Injection/Arthrocentesis  . XR Hand Complete Left  . XR Shoulder Left   No orders of the defined types were placed in this encounter.     Procedures: Large Joint Inj Date/Time: 05/25/2016 11:06 AM Performed by: Mcarthur Rossetti Authorized by: Jean Rosenthal Y   Location:  Shoulder Site:  L glenohumeral Ultrasound Guidance: No   Fluoroscopic Guidance: No   Arthrogram: No   Medications:  3 mL lidocaine 1 %; 40 mg methylPREDNISolone acetate 40 MG/ML Hand/UE Inj Date/Time: 05/25/2016 11:06 AM Performed by: Mcarthur Rossetti Authorized by: Mcarthur Rossetti   Condition: trigger finger   Location:  Thumb Site:  L thumb A1 Medications:  1 mL lidocaine 1 %; 40 mg methylPREDNISolone acetate 40 MG/ML Hand/UE Inj Date/Time: 05/25/2016 11:07 AM Performed by: Mcarthur Rossetti Authorized by: Mcarthur Rossetti   Condition: trigger finger   Location:  Index finger Site:  L index A1 Medications:  1 mL lidocaine 1 %; 40 mg methylPREDNISolone acetate 40 MG/ML     Clinical Data: No additional  findings.   Subjective: Chief Complaint  Patient presents with  . Left Hand - Pain    Left hand pain, swelling, fingers "jumping", unable to make fully closed fist. Symptoms 1+yr, worse yesterday.  . Left Shoulder - Pain    Left shoulder pain cannot fully extend above shoulder level. No injury.    He is a patient is actually seen my brother before. He comes in with triggering of his left thumb and left index finger but also pain around the MCP joint of his index finger. He is also had problems with left shoulder pain and pain with overhead activities and reaching behind him. His of all, on slowly. He denies a nubs and tingling in his hand. He has pain more again with overhead activities and does wake him up at night. He's had a long, located medical history in terms of GI issues and bowel surgery. He cannot take anti-inflammatories due to this. HPI  Review of Systems Currently denies any fever and chills as well as nausea and vomiting. He denies any chest pain headache or shortness of breath.  Objective: Vital Signs: There were no vitals taken for this visit.  Physical Exam Hurt and oriented 3 in no acute distress Ortho Exam Base of his left shoulder does show positive Neer and Hawkins signs. His rotator cuff itself appears to be intact. He has a negative liftoff and is external rotation  is full he has problems with pain reaching behind him. Exam issue of his left hand does show weak pinching grip strength. He has definitely prominence and pain of the index finger MCP joint. He has active triggering of the A1 pulley of his thumb as well as his index finger. Specialty Comments:  No specialty comments available.  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  Xr Shoulder Left  Result Date: 05/25/2016 3 views of his left shoulder including AP outlet and axillary show  well located shoulder with no acute    PMFS History: Patient Active Problem List   Diagnosis Date Noted  . Postoperative fever 11/19/2015  . 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 Severe protein-calorie malnutrition ([place]) 11/17/2015  . Sepsis ([place]) 11/14/2015  . Hiccups 11/14/2015  . AKI (acute kidney injury) ([place]) 11/14/2015  . Fever   . Leg swelling   . Left shoulder pain   . Muscle spasm of left shoulder   . GI bleed 10/27/2015  . Acute blood loss anemia 10/27/2015  . Syncope 10/27/2015  . Hyperglycemia 10/27/2015  . Hypotension 10/27/2015  . Neck pain 10/27/2015  . Hematemesis 10/27/2015  . Hematochezia 10/27/2015  . Diverticulitis of colon with perforation 07/05/2015   Past Medical History:  Diagnosis Date  . Anemia   . Anxiety   . Gastritis   . GI bleed due to NSAIDs 10/27/2015  . Hyperlipidemia   . Hypertension   . Kidney stones   . 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 Sigmoid diverticulitis    with perforation    Family History  Problem Relation Age of Onset  . Stroke Mother   . Stroke Brother   . Heart disease Brother     Past Surgical History:  Procedure Laterality Date  . COLON SURGERY    . ESOPHAGOGASTRODUODENOSCOPY N/A 10/27/2015   Procedure: ESOPHAGOGASTRODUODENOSCOPY (EGD);  Surgeon: Clarene Essex, MD;  Location: [facility] ENDOSCOPY;  Service: Endoscopy;  Laterality: N/A;  . ESOPHAGOGASTRODUODENOSCOPY N/A 10/29/2015   Procedure: ESOPHAGOGASTRODUODENOSCOPY (EGD);  Surgeon: Ronald Lobo, MD;  Location: [facility] ENDOSCOPY;  Service: Endoscopy;  Laterality: N/A;  . ESOPHAGOGASTRODUODENOSCOPY N/A 10/30/2015   Procedure: ESOPHAGOGASTRODUODENOSCOPY (EGD);  Surgeon: Clarene Essex, MD;  Location: [facility] ENDOSCOPY;  Service: Endoscopy;  Laterality: N/A;  . GASTROSTOMY TUBE PLACEMENT  11/21/2015   REDUCTION OF HIATAL HERNIA , REPAIR HIATAL HERNIA, RESECTION SMALL BOWEL WITH ANASTOMOSIS, PLACEMENT GASTROSTOMY TUBE, PLACEMENT DUODENOSTOMY TUBE (N/A)  . HEMORRHOID BANDING  X 2  .  HIATAL HERNIA REPAIR N/A 11/21/2015   Procedure: REDUCTION OF HIATAL HERNIA , REPAIR HIATAL HERNIA, RESECTION SMALL BOWEL WITH ANASTOMOSIS, PLACEMENT GASTROSTOMY TUBE, PLACEMENT DUODENOSTOMY TUBE;  Surgeon: Mickeal Skinner, MD;  Location: [place];  Service: General;  Laterality: N/A;  . [place]   "opened me up"  . LAPAROTOMY N/A 07/05/2015   Procedure: PARTIAL SIGMOID COLECTOMY AND COLOSTOMY;  Surgeon: [name], MD;  Location: [place];  Service: General;  Laterality: N/A;  . MELANOMA EXCISION  2001  . 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 REPAIR OF PERFORATED ULCER N/A 10/30/2015   Procedure: REPAIR OF BLEEDING  ULCER;  Surgeon: Judeth Horn, MD;  Location: [place];  Service: General;  Laterality:  N/A;  . TUMOR EXCISION  2009   "back; fatty tumor"   Social History   Occupational History  . unable to work since April    Social History Main Topics  . Smoking status: Never Smoker  . Smokeless tobacco: Former Systems developer    Types: Snuff    Quit date: 07/14/2015  . Alcohol use No  . Drug use: No  . Sexual activity: Yes

## 2016-05-31 MED FILL — TAMSULOSIN HCL 0.4 MG CAP: 0.4 | 30 days supply | Qty: 30 | Fill #1

## 2016-06-09 ENCOUNTER — Ambulatory Visit (INDEPENDENT_AMBULATORY_CARE_PROVIDER_SITE_OTHER): Payer: 59 | Admitting: Orthopaedic Surgery

## 2016-06-09 ENCOUNTER — Ambulatory Visit (INDEPENDENT_AMBULATORY_CARE_PROVIDER_SITE_OTHER): Payer: 59

## 2016-06-09 DIAGNOSIS — M549 Dorsalgia, unspecified: Secondary | ICD-10-CM | POA: Insufficient documentation

## 2016-06-09 DIAGNOSIS — M546 Pain in thoracic spine: Secondary | ICD-10-CM

## 2016-06-09 MED ORDER — METHYLPREDNISOLONE ACETATE 40 MG/ML IJ SUSP
40.0000 mg | INTRAMUSCULAR | Status: AC | PRN
  Administered 2016-06-09: 40 mg via INTRAMUSCULAR

## 2016-06-09 NOTE — Progress Notes (Signed)
Office Visit Note   Patient: Miguel [name]           Date of Birth: [birth year]           MRN: [id number] Visit Date: 06/09/2016              Requested by: L.Donnie Coffin, MD [place] [employer] [place], [place] [address] PCP: Donnie Coffin, MD   Assessment & Plan: Visit Diagnoses:  1. Mid back pain   2. Pain in thoracic spine     Plan: I felt that he would definitely benefit from trigger point injection around the most painful area of his thoracic spine and parascapular area. He agreed with this. I prepped this area with Betadine and alcohol carotid trigger point injection with steroid which he tolerated well. I'll see him back in 6 weeks because he is still having symptomatic triggering of his thumb at the A1 pulley. The injection helped in terms of calming down his pain but he still having triggering and I would recommend at least 1 more injection down the road in this thumb prior to thinking about surgery.  Follow-Up Instructions: Return in about 6 weeks (around 07/21/2016).   Orders:  Orders Placed This Encounter  Procedures  . Trigger Point Injection  . XR Thoracic Spine 2 View   No orders of the defined types were placed in this encounter.     Procedures: Trigger Point Inj Date/Time: 06/09/2016 3:49 PM Performed by: Mcarthur Rossetti Authorized by: Jean Rosenthal Y   Indications:  Muscle spasm Total # of Trigger Points:  1 Location: back   Medications #1:  40 mg methylPREDNISolone acetate 40 MG/ML     Clinical Data: No additional findings.   Subjective: No chief complaint on file. The patient is a 15 her low back she just saw recently and provided a steroid injection in his left shoulder and his left trigger thumb. He comes today with a chief complaint of thoracic spine pain and this is more parascapular pain were he points to about the mid thoracic spine. This is been going on since abdominal surgery that he has had in the past. He  denies any rash in this area. He denies any radicular components of this pain.  HPI  Review of Systems He denies any recent illnesses. 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  Physical Exam  He is alert and oriented 3 in no acute distress Ortho Exam Examination of his thoracic spine shows no rash or any findings consistent of shingles. He does have paraspinal muscular pain and parascapular pain to the right side at about the mid level. This seems to be musculoskeletal in origin and does not appear deep. Specialty Comments:  No specialty comments available.  Imaging: Xr Thoracic Spine 2 View  Result Date: 06/09/2016 2 views of his thoracic spine show no acute findings. He has some mild degenerative changes throughout the thoracic spine but no evidence of Compressive deformity    PMFS History: Patient Active Problem List   Diagnosis Date Noted  . Pain in thoracic spine 06/09/2016  . Mid back pain 06/09/2016  . Postoperative fever 11/19/2015  . 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 Severe protein-calorie malnutrition ([place]) 11/17/2015  . Sepsis ([place]) 11/14/2015  . Hiccups 11/14/2015  . AKI (acute kidney injury) ([place]) 11/14/2015  . Fever   .  Leg swelling   . Left shoulder pain   . Muscle spasm of left shoulder   . GI bleed 10/27/2015  . Acute blood loss anemia 10/27/2015  . Syncope 10/27/2015  . Hyperglycemia 10/27/2015  . Hypotension 10/27/2015  . Neck pain 10/27/2015  . Hematemesis 10/27/2015  . Hematochezia 10/27/2015  . Diverticulitis of colon with perforation 07/05/2015   Past Medical History:  Diagnosis Date  . Anemia   . Anxiety   . Gastritis   . GI bleed due to NSAIDs 10/27/2015  . Hyperlipidemia   . Hypertension   . Kidney stones   . 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 Sigmoid diverticulitis    with perforation     Family History  Problem Relation Age of Onset  . Stroke Mother   . Stroke Brother   . Heart disease Brother     Past Surgical History:  Procedure Laterality Date  . COLON SURGERY    . ESOPHAGOGASTRODUODENOSCOPY N/A 10/27/2015   Procedure: ESOPHAGOGASTRODUODENOSCOPY (EGD);  Surgeon: Clarene Essex, MD;  Location: [facility] END[facility]COPY;  Service: Endoscopy;  Laterality: N/A;  . ESOPHAGOGASTRODUODENOSCOPY N/A 10/29/2015   Procedure: ESOPHAGOGASTRODUODENOSCOPY (EGD);  Surgeon: Ronald Lobo, MD;  Location[facility] ENDOSCOPY;  Service: Endoscopy;  Laterality: N/A;  . ESOPHAGOGASTRODUODENOSCOPY N/A 10/30/2015   Procedure: ESOPHAGOGASTRODUODENOSCOPY (EGD);  Surgeon: Clarene Essex, MD;  Location: [facility] ENDOSCOPY;  Service: Endoscopy;  Laterality: N/A;  . GASTROSTOMY TUBE PLACEMENT  11/21/2015   REDUCTION OF HIATAL HERNIA , REPAIR HIATAL HERNIA, RESECTION SMALL BOWEL WITH ANASTOMOSIS, PLACEMENT GASTROSTOMY TUBE, PLACEMENT DUODENOSTOMY TUBE (N/A)  . HEMORRHOID BANDING  X 2  . HIATAL HERNIA REPAIR N/A 11/21/2015   Procedure: REDUCTION OF HIATAL HERNIA , REPAIR HIATAL HERNIA, RESECTION SMALL BOWEL WITH ANASTOMOSIS, PLACEMENT GASTROSTOMY TUBE, PLACEMENT DUODENOSTOMY TUBE;  Surgeon: Mickeal Skinner, MD;  Location: [place];  Service: General;  Laterality: N/A;  . [place]   "opened me up"  . LAPAROTOMY N/A 07/05/2015   Procedure: PARTIAL SIGMOID COLECTOMY AND COLOSTOMY;  Surgeon: [name], MD;  Location: [place];  Service: General;  Laterality: N/A;  . MELANOMA EXCISION  2001  . 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  10/30/2015   Procedure: REMOVAL OF DISTAL STOMACH;  Surgeon: Judeth Horn, MD;  Location: [place];  Service: General;;  . REPAIR OF PERFORATED ULCER N/A 10/30/2015   Procedure: REPAIR OF BLEEDING  ULCER;  Surgeon: Judeth Horn, MD;  Location: [place];  Service: General;  Laterality: N/A;  . TUMOR EXCISION  2009   "back; fatty tumor"   Social History   Occupational History    . unable to work since April    Social History Main Topics  . Smoking status: Never Smoker  . Smokeless tobacco: Former Systems developer    Types: Snuff    Quit date: 07/14/2015  . Alcohol use No  . Drug use: No  . Sexual activity: Yes

## 2016-06-17 ENCOUNTER — Other Ambulatory Visit: Payer: Self-pay

## 2016-06-17 NOTE — Patient Outreach (Signed)
[facility]) Care Management  06/17/2016  Miguel [name] [birth year] [id number]   Subjective: Telephone call to patient. Discussed UMR pre-op follow up. Patient voices understanding and agrees to pre-op call.    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 Colostomy Takedown Surgery scheduled for 06/28/16. History of HTN, anemia, GI Bleed, diverticulitis of colon with perforation, melanoma of back.  Assessment: received UMR pre-op call referral. Pre op call completed.   FMLA-client reports he does not qualify for FMLA. Reports his wife is the employee and has used FMLA on several occasions in the past. [name] states the last he heard, her FMLA had expired. RNCM instructed client to ask his wife to call matrix to see if she can reapply for FMLA. RNCM offered to provide the contact number to matrix. [name] stated he would have his wife to call RNCM if she needs the number.  RNCM discussed hospital indemnity plan and encouraged client to discuss if she chose this as part of her benefit package. Reinforced to follow up with Benefits service office with questions.  Equipment needs/Home health needs-RNCM informed client the hospital will arrange, if he has any of these needs prior to discharge.   RNCM discussed [place] benefit is higher when using a [facility] facility/pharmacy. Client voiced understanding.    Support:  Client reports he has transportation to his follow up appointment. Client also reports that he has someone to assist with pharmacy prescriptions at discharge if he has any new prescriptions. RNCM reinforced if he is discharged home on a weekend that he will have to use a local pharmacy.     Discussed Advanced Directives. RNCM reinforced that Spiritual care department available to assist [employer] employees with Advanced Directives as needed.  No other medical issues identified and no additional  community resource information needs at this time.   Patient is agreeable to follow up post procedure call.  RNCM encouraged Mr. Kelleher to follow up with RNCM with questions if needed.  Plan: telephonic RNCM will follow up within 3 business days of notification.   Thea Silversmith, RN, MSN, [place] Coordinator Cell: [phone]

## 2016-06-21 ENCOUNTER — Encounter (HOSPITAL_COMMUNITY)
Admission: RE | Admit: 2016-06-21 | Discharge: 2016-06-21 | Disposition: A | Discharge status: Home / Self Care | Payer: 59 | Source: Ambulatory Visit | Attending: Surgery | Admitting: Surgery

## 2016-06-21 ENCOUNTER — Other Ambulatory Visit: Payer: Self-pay

## 2016-06-21 ENCOUNTER — Encounter (HOSPITAL_COMMUNITY): Payer: Self-pay

## 2016-06-21 DIAGNOSIS — Z01812 Encounter for preprocedural laboratory examination: Secondary | ICD-10-CM | POA: Diagnosis not present

## 2016-06-21 DIAGNOSIS — Z433 Encounter for attention to colostomy: Secondary | ICD-10-CM | POA: Insufficient documentation

## 2016-06-21 HISTORY — DX: Benign prostatic hyperplasia without lower urinary tract symptoms: N40.0

## 2016-06-21 LAB — BASIC METABOLIC PANEL
ANION GAP: 8 (ref 5–15)
BUN: 16 mg/dL (ref 6–20)
CALCIUM: 9.2 mg/dL (ref 8.9–10.3)
CO2: 29 mmol/L (ref 22–32)
Chloride: 99 mmol/L — ABNORMAL LOW (ref 101–111)
Creatinine, Ser: 0.92 mg/dL (ref 0.61–1.24)
GFR calc Af Amer: >60 mL/min (ref >60)
Glucose, Bld: 107 mg/dL — ABNORMAL HIGH (ref 65–99)
Potassium: 4.6 mmol/L (ref 3.5–5.1)
Sodium: 136 mmol/L (ref 135–145)

## 2016-06-21 LAB — CBC
HCT: 42.3 % (ref 39.0–52.0)
Hemoglobin: 13.8 g/dL (ref 13.0–17.0)
MCH: 29.8 pg (ref 26.0–34.0)
MCHC: 32.6 g/dL (ref 30.0–36.0)
MCV: 91.4 fL (ref 78.0–100.0)
PLATELETS: 231 10*3/uL (ref 150–400)
RBC: 4.63 MIL/uL (ref 4.22–5.81)
RDW: 15.1 % (ref 11.5–15.5)
WBC: 8.2 10*3/uL (ref 4.0–10.5)

## 2016-06-21 MED ORDER — CHLORHEXIDINE GLUCONATE CLOTH 2 % EX PADS: 6.0000 | MEDICATED_PAD | Freq: Once | CUTANEOUS | Status: DC

## 2016-06-21 NOTE — Patient Outreach (Signed)
[facility]) Care Management  06/21/2016  Miguel [name] 31-Mar-1950 [id number]   RNCM received a call from client's wife, Miguel [name]. RNCM returned call. No answer. HIPPA compliant message left.   Thea Silversmith, RN, MSN, [place] Coordinator Cell: [phone]

## 2016-06-21 NOTE — Pre-Procedure Instructions (Signed)
    Miguel [name]  06/21/2016      [facility] - [place], [place] - [address] [place] [address] Phone: [phone] Fax: [phone]  [facility] - [place], [address] [place] [address] Phone: [phone] Fax: [phone]    Your procedure is scheduled on 06/28/16.  Report to [facility] Admitting at 745 A.M.  Call this number if you have problems the morning of surgery:  [phone]   Remember:  Do not eat food or drink liquids after midnight.  Take these medicines the morning of surgery with A SIP OF WATER --tylenol,flomax,ultram   Do not wear jewelry, make-up or nail polish.  Do not wear lotions, powders, or perfumes, or deoderant.  Do not shave 48 hours prior to surgery.  Men may shave face and neck.  Do not bring valuables to the hospital.  [facility] is not responsible for any belongings or valuables.  Contacts, dentures or bridgework may not be worn into surgery.  Leave your suitcase in the car.  After surgery it may be brought to your room.  For patients admitted to the hospital, discharge time will be determined by your treatment team.  Patients discharged the day of surgery will not be allowed to drive home.   Name and phone number of your driver:    Special instructions:  Do not take any aspirin,anti-inflammatories,vitamins,or herbal supplements 5-7 days prior to surgery.  Please read over the following fact sheets that you were given.

## 2016-06-21 NOTE — Pre-Procedure Instructions (Signed)
    Miguel [name]  06/21/2016      [facility] - [place], [place] - [address] [place] [address] Phone: [phone] Fax: [phone]  [facility] - [place], [place] [address] Phone: [phone] Fax: [phone]    Your procedure is scheduled on 06/28/16.  Report to [facility] Admitting at 745 A.M.  Call this number if you have problems the morning of surgery:  [phone]   Remember:  Do not eat food or drink liquids after midnight.  Take these medicines the morning of surgery with A SIP OF WATER ---pepcid,flomax,ultram,tylenol   Do not wear jewelry, make-up or nail polish.  Do not wear lotions, powders, or perfumes, or deoderant.  Do not shave 48 hours prior to surgery.  Men may shave face and neck.  Do not bring valuables to the hospital.  [facility] is not responsible for any belongings or valuables.  Contacts, dentures or bridgework may not be worn into surgery.  Leave your suitcase in the car.  After surgery it may be brought to your room.  For patients admitted to the hospital, discharge time will be determined by your treatment team.  Patients discharged the day of surgery will not be allowed to drive home.   Name and phone number of your driver:   Special instructions:  Do not take any aspirin,anti-inflammatories,vitamins,or herbal supplements 5-7 days prior to surgery.  Please read over the following fact sheets that you were given.

## 2016-06-22 LAB — HEMOGLOBIN A1C
Hgb A1c MFr Bld: 5.5 % (ref 4.8–5.6)
MEAN PLASMA GLUCOSE: 111 mg/dL

## 2016-06-27 DIAGNOSIS — Z9289 Personal history of other medical treatment: Secondary | ICD-10-CM

## 2016-06-27 HISTORY — DX: Personal history of other medical treatment: Z92.89

## 2016-06-27 NOTE — H&P (Signed)
Miguel [name] is an 66 y.o. male.   Chief Complaint: colostomy in place  HPI: this is a 66 year old male s/p emergent Hartman's procedure for perforated sigmoid diverticulitis almost one year ago.  Since that time, he has had emergent gastric surgery and has a feeding tube in place.  He has recovered well from that surgery and wishes to proceed with a colostomy takedown.  He currently has no complaints  Past Medical History:  Diagnosis Date  . Anemia   . Anxiety   . BPH (benign prostatic hyperplasia)   . Gastritis   . GI bleed due to NSAIDs 10/27/2015  . Hyperlipidemia   . Hypertension   . Kidney stones   . 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 Sigmoid diverticulitis    with perforation    Past Surgical History:  Procedure Laterality Date  . COLON SURGERY     sigmoid  . ESOPHAGOGASTRODUODENOSCOPY N/A 10/27/2015   Procedure: ESOPHAGOGASTRODUODENOSCOPY (EGD);  Surgeon: Clarene Essex, MD;  Location: [facility] ENDOSCOPY;  Service: Endoscopy;  Laterality: N/A;  . ESOPHAGOGASTRODUODENOSCOPY N/A 10/29/2015   Procedure: ESOPHAGOGASTRODUODENOSCOPY (EGD);  Surgeon: Ronald Lobo, MD;  Location: [facility] ENDOSCOPY;  Service: Endoscopy;  Laterality: N/A;  . ESOPHAGOGASTRODUODENOSCOPY N/A 10/30/2015   Procedure: ESOPHAGOGASTRODUODENOSCOPY (EGD);  Surgeon: Clarene Essex, MD;  Location: [facility] ENDOSCOPY;  Service: Endoscopy;  Laterality: N/A;  . GASTROSTOMY TUBE PLACEMENT  11/21/2015   REDUCTION OF HIATAL HERNIA , REPAIR HIATAL HERNIA, RESECTION SMALL BOWEL WITH ANASTOMOSIS, PLACEMENT GASTROSTOMY TUBE, PLACEMENT DUODENOSTOMY TUBE (N/A)  . HEMORRHOID BANDING  X 2  . HIATAL HERNIA REPAIR N/A 11/21/2015   Procedure: REDUCTION OF HIATAL HERNIA , REPAIR HIATAL HERNIA, RESECTION SMALL BOWEL WITH ANASTOMOSIS, PLACEMENT GASTROSTOMY TUBE, PLACEMENT DUODENOSTOMY TUBE;  Surgeon: Mickeal Skinner, MD;  Location: [place];  Service: General;  Laterality: N/A;  . [place]   "opened me up"  . LAPAROTOMY N/A  07/05/2015   Procedure: PARTIAL SIGMOID COLECTOMY AND COLOSTOMY;  Surgeon: [name], MD;  Location: [place];  Service: General;  Laterality: N/A;  . MELANOMA EXCISION  2001  . 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  10/30/2015   Procedure: REMOVAL OF DISTAL STOMACH;  Surgeon: Judeth Horn, MD;  Location: [place];  Service: General;;  . REPAIR OF PERFORATED ULCER N/A 10/30/2015   Procedure: REPAIR OF BLEEDING  ULCER;  Surgeon: Judeth Horn, MD;  Location: [place];  Service: General;  Laterality: N/A;  . TUMOR EXCISION  2009   "back; fatty tumor"    Family History  Problem Relation Age of Onset  . Stroke Mother   . Stroke Brother   . Heart disease Brother    Social History:  reports that he has never smoked. He quit smokeless tobacco use about a year ago. His smokeless tobacco use included Snuff. He reports that he does not drink alcohol or use drugs.  Allergies:  Allergies  Allergen Reactions  . No Known Allergies     No prescriptions prior to admission.    No results found for this or any previous visit (from the past 48 hour(s)). No results found.  Review of Systems  Constitutional: Negative for fever.  Respiratory: Negative for shortness of breath.   Cardiovascular: Negative for chest pain.  Gastrointestinal: Negative for abdominal pain, nausea and vomiting.  All other systems reviewed and are negative.   There were no vitals taken for this visit. Physical Exam  Constitutional: He is oriented to  person, place, and time. He appears well-developed and well-nourished. No distress.  HENT:  Head: Normocephalic and atraumatic.  Right Ear: External ear normal.  Left Ear: External ear normal.  Nose: Nose normal.  Mouth/Throat: Oropharynx is clear and moist.  Eyes: Pupils are equal, round, and reactive to light.  Neck: Normal range of motion. No tracheal deviation present.  Cardiovascular: Normal rate, regular rhythm, normal heart sounds and intact distal pulses.    Respiratory: Effort normal and breath sounds normal. No respiratory distress. He has no wheezes.  GI: Soft. There is no tenderness. There is no rebound.  Feeding tube in the LUQ Colostomy in the LLQ Incisional hernia  Musculoskeletal: Normal range of motion. He exhibits no edema.  Lymphadenopathy:    He has no cervical adenopathy.  Neurological: He is alert and oriented to person, place, and time.  Skin: Skin is warm. No rash noted. He is not diaphoretic. No erythema.  Psychiatric: His behavior is normal. Judgment normal.     Assessment/Plan Colostomy in place  We will now proceed with a colostomy takedown and colon reanastomosis.  He understands that this will result in a repair of his incisional hernia as a result without the use of mesh.  I discussed the risks which include but are not limited to bleeding, infection, injury to surrounding structures, anastomotic problems including leak, the need for further surgery, cardiopulmonary problems, DVT, etc.  He agrees to proceed.  Harl Bowie, MD 06/27/2016, 8:19 PM

## 2016-06-28 ENCOUNTER — Encounter (HOSPITAL_COMMUNITY)
Admission: RE | Disposition: A | Discharge status: Home Health | Payer: Self-pay | Source: Ambulatory Visit | Attending: Surgery

## 2016-06-28 ENCOUNTER — Encounter (HOSPITAL_COMMUNITY): Payer: Self-pay | Admitting: *Deleted

## 2016-06-28 ENCOUNTER — Inpatient Hospital Stay (HOSPITAL_COMMUNITY): Payer: 59 | Admitting: Anesthesiology

## 2016-06-28 ENCOUNTER — Inpatient Hospital Stay (HOSPITAL_COMMUNITY)
Admission: RE | Admit: 2016-06-28 | Discharge: 2016-07-13 | DRG: 344 | Disposition: A | Discharge status: Home Health | Payer: 59 | Source: Ambulatory Visit | Attending: Surgery | Admitting: Surgery

## 2016-06-28 DIAGNOSIS — R509 Fever, unspecified: Secondary | ICD-10-CM

## 2016-06-28 DIAGNOSIS — Y833 Surgical operation with formation of external stoma as the cause of abnormal reaction of the patient, or of later complication, without mention of misadventure at the time of the procedure: Secondary | ICD-10-CM | POA: Diagnosis not present

## 2016-06-28 DIAGNOSIS — D62 Acute posthemorrhagic anemia: Secondary | ICD-10-CM | POA: Diagnosis not present

## 2016-06-28 DIAGNOSIS — K567 Ileus, unspecified: Secondary | ICD-10-CM | POA: Diagnosis not present

## 2016-06-28 DIAGNOSIS — K651 Peritoneal abscess: Secondary | ICD-10-CM | POA: Diagnosis not present

## 2016-06-28 DIAGNOSIS — Z85820 Personal history of malignant melanoma of skin: Secondary | ICD-10-CM | POA: Diagnosis not present

## 2016-06-28 DIAGNOSIS — Z823 Family history of stroke: Secondary | ICD-10-CM

## 2016-06-28 DIAGNOSIS — J9811 Atelectasis: Secondary | ICD-10-CM | POA: Diagnosis not present

## 2016-06-28 DIAGNOSIS — Z8249 Family history of ischemic heart disease and other diseases of the circulatory system: Secondary | ICD-10-CM | POA: Diagnosis not present

## 2016-06-28 DIAGNOSIS — R55 Syncope and collapse: Secondary | ICD-10-CM

## 2016-06-28 DIAGNOSIS — Z87891 Personal history of nicotine dependence: Secondary | ICD-10-CM

## 2016-06-28 DIAGNOSIS — Z933 Colostomy status: Secondary | ICD-10-CM | POA: Diagnosis not present

## 2016-06-28 DIAGNOSIS — K432 Incisional hernia without obstruction or gangrene: Secondary | ICD-10-CM | POA: Diagnosis present

## 2016-06-28 DIAGNOSIS — B952 Enterococcus as the cause of diseases classified elsewhere: Secondary | ICD-10-CM | POA: Diagnosis not present

## 2016-06-28 DIAGNOSIS — D500 Iron deficiency anemia secondary to blood loss (chronic): Secondary | ICD-10-CM | POA: Diagnosis not present

## 2016-06-28 DIAGNOSIS — K5720 Diverticulitis of large intestine with perforation and abscess without bleeding: Secondary | ICD-10-CM | POA: Diagnosis not present

## 2016-06-28 DIAGNOSIS — N200 Calculus of kidney: Secondary | ICD-10-CM | POA: Diagnosis not present

## 2016-06-28 DIAGNOSIS — Z87442 Personal history of urinary calculi: Secondary | ICD-10-CM | POA: Diagnosis not present

## 2016-06-28 DIAGNOSIS — N179 Acute kidney failure, unspecified: Secondary | ICD-10-CM | POA: Diagnosis not present

## 2016-06-28 DIAGNOSIS — Z433 Encounter for attention to colostomy: Secondary | ICD-10-CM | POA: Diagnosis not present

## 2016-06-28 DIAGNOSIS — R140 Abdominal distension (gaseous): Secondary | ICD-10-CM | POA: Diagnosis not present

## 2016-06-28 DIAGNOSIS — R079 Chest pain, unspecified: Secondary | ICD-10-CM | POA: Diagnosis not present

## 2016-06-28 DIAGNOSIS — L0291 Cutaneous abscess, unspecified: Secondary | ICD-10-CM

## 2016-06-28 DIAGNOSIS — I10 Essential (primary) hypertension: Secondary | ICD-10-CM | POA: Diagnosis present

## 2016-06-28 DIAGNOSIS — Z9889 Other specified postprocedural states: Secondary | ICD-10-CM

## 2016-06-28 DIAGNOSIS — K9423 Gastrostomy malfunction: Secondary | ICD-10-CM | POA: Diagnosis not present

## 2016-06-28 DIAGNOSIS — R739 Hyperglycemia, unspecified: Secondary | ICD-10-CM

## 2016-06-28 DIAGNOSIS — K3189 Other diseases of stomach and duodenum: Secondary | ICD-10-CM

## 2016-06-28 DIAGNOSIS — K9429 Other complications of gastrostomy: Secondary | ICD-10-CM | POA: Diagnosis not present

## 2016-06-28 DIAGNOSIS — R188 Other ascites: Secondary | ICD-10-CM | POA: Diagnosis not present

## 2016-06-28 DIAGNOSIS — M542 Cervicalgia: Secondary | ICD-10-CM | POA: Diagnosis not present

## 2016-06-28 DIAGNOSIS — Z8711 Personal history of peptic ulcer disease: Secondary | ICD-10-CM | POA: Diagnosis not present

## 2016-06-28 HISTORY — PX: INCISIONAL HERNIA REPAIR: SHX193

## 2016-06-28 HISTORY — PX: COLOSTOMY TAKEDOWN: SHX5783

## 2016-06-28 LAB — PREPARE RBC (CROSSMATCH)

## 2016-06-28 LAB — CBC
HEMATOCRIT: 35.3 % — AB (ref 39.0–52.0)
HEMOGLOBIN: 11.6 g/dL — AB (ref 13.0–17.0)
MCH: 29.8 pg (ref 26.0–34.0)
MCHC: 32.9 g/dL (ref 30.0–36.0)
MCV: 90.7 fL (ref 78.0–100.0)
Platelets: 259 10*3/uL (ref 150–400)
RBC: 3.89 MIL/uL — AB (ref 4.22–5.81)
RDW: 14.5 % (ref 11.5–15.5)
WBC: 13.3 10*3/uL — AB (ref 4.0–10.5)

## 2016-06-28 SURGERY — CLOSURE, COLOSTOMY
Anesthesia: General | Site: Abdomen

## 2016-06-28 MED ORDER — SUGAMMADEX SODIUM 200 MG/2ML IV SOLN
INTRAVENOUS | Status: DC | PRN
  Administered 2016-06-28: 300 mg via INTRAVENOUS

## 2016-06-28 MED ORDER — OXYCODONE HCL 5 MG/5ML PO SOLN: 5.0000 mg | Freq: Once | ORAL | Status: DC | PRN

## 2016-06-28 MED ORDER — ENOXAPARIN SODIUM 40 MG/0.4ML ~~LOC~~ SOLN: 40.0000 mg | SUBCUTANEOUS | Status: DC

## 2016-06-28 MED ORDER — ONDANSETRON HCL 4 MG/2ML IJ SOLN
4.0000 mg | Freq: Four times a day (QID) | INTRAMUSCULAR | Status: DC | PRN
  Administered 2016-07-01 – 2016-07-11 (×4): 4 mg via INTRAVENOUS
  Filled 2016-06-28 (×10): qty 2

## 2016-06-28 MED ORDER — CEFOTETAN DISODIUM-DEXTROSE 2-2.08 GM-% IV SOLR
INTRAVENOUS | Status: AC
  Filled 2016-06-28: qty 50

## 2016-06-28 MED ORDER — 0.9 % SODIUM CHLORIDE (POUR BTL) OPTIME
TOPICAL | Status: DC | PRN
  Administered 2016-06-28 (×2): 1000 mL

## 2016-06-28 MED ORDER — SODIUM CHLORIDE 0.9 % IV BOLUS (SEPSIS)
1000.0000 mL | Freq: Once | INTRAVENOUS | Status: AC
Start: 2016-06-28 — End: 2016-06-28
  Administered 2016-06-28: 1000 mL via INTRAVENOUS

## 2016-06-28 MED ORDER — TAMSULOSIN HCL 0.4 MG PO CAPS
0.4000 mg | ORAL_CAPSULE | Freq: Every day | ORAL | Status: DC
  Administered 2016-06-28 – 2016-07-13 (×16): 0.4 mg via ORAL
  Filled 2016-06-28 (×16): qty 1

## 2016-06-28 MED ORDER — MIDAZOLAM HCL 5 MG/5ML IJ SOLN
INTRAMUSCULAR | Status: DC | PRN
  Administered 2016-06-28: 2 mg via INTRAVENOUS

## 2016-06-28 MED ORDER — HYDROMORPHONE 1 MG/ML IV SOLN
INTRAVENOUS | Status: AC
  Filled 2016-06-28: qty 25

## 2016-06-28 MED ORDER — OXYCODONE HCL 5 MG PO TABS: 5.0000 mg | ORAL_TABLET | Freq: Once | ORAL | Status: DC | PRN

## 2016-06-28 MED ORDER — ONDANSETRON HCL 4 MG/2ML IJ SOLN
INTRAMUSCULAR | Status: DC | PRN
  Administered 2016-06-28: 4 mg via INTRAVENOUS

## 2016-06-28 MED ORDER — PROMETHAZINE HCL 25 MG/ML IJ SOLN
12.5000 mg | Freq: Four times a day (QID) | INTRAMUSCULAR | Status: DC | PRN
  Administered 2016-06-28 – 2016-07-08 (×7): 12.5 mg via INTRAVENOUS
  Filled 2016-06-28 (×7): qty 1

## 2016-06-28 MED ORDER — PROMETHAZINE HCL 25 MG/ML IJ SOLN
INTRAMUSCULAR | Status: AC
  Administered 2016-06-28: 6.25 mg via INTRAVENOUS
  Filled 2016-06-28: qty 1

## 2016-06-28 MED ORDER — ROCURONIUM BROMIDE 50 MG/5ML IV SOSY
PREFILLED_SYRINGE | INTRAVENOUS | Status: AC
  Filled 2016-06-28: qty 10

## 2016-06-28 MED ORDER — ONDANSETRON HCL 4 MG/2ML IJ SOLN
INTRAMUSCULAR | Status: AC
  Filled 2016-06-28: qty 4

## 2016-06-28 MED ORDER — EPHEDRINE SULFATE 50 MG/ML IJ SOLN
INTRAMUSCULAR | Status: DC | PRN
  Administered 2016-06-28: 10 mg via INTRAVENOUS

## 2016-06-28 MED ORDER — ROCURONIUM BROMIDE 100 MG/10ML IV SOLN
INTRAVENOUS | Status: DC | PRN
  Administered 2016-06-28: 20 mg via INTRAVENOUS
  Administered 2016-06-28: 50 mg via INTRAVENOUS
  Administered 2016-06-28: 10 mg via INTRAVENOUS

## 2016-06-28 MED ORDER — HYDROMORPHONE HCL 1 MG/ML IJ SOLN
INTRAMUSCULAR | Status: AC
  Administered 2016-06-28: 0.5 mg via INTRAVENOUS
  Filled 2016-06-28: qty 0.5

## 2016-06-28 MED ORDER — NALOXONE HCL 0.4 MG/ML IJ SOLN: 0.4000 mg | INTRAMUSCULAR | Status: DC | PRN

## 2016-06-28 MED ORDER — LACTATED RINGERS IV SOLN
INTRAVENOUS | Status: DC
Start: 2016-06-28 — End: 2016-06-28
  Administered 2016-06-28: 09:00:00 via INTRAVENOUS

## 2016-06-28 MED ORDER — FENTANYL CITRATE (PF) 100 MCG/2ML IJ SOLN
INTRAMUSCULAR | Status: DC | PRN
  Administered 2016-06-28 (×5): 50 ug via INTRAVENOUS
  Administered 2016-06-28: 100 ug via INTRAVENOUS

## 2016-06-28 MED ORDER — DEXTROSE 5 % IV SOLN
2.0000 g | INTRAVENOUS | Status: AC
  Administered 2016-06-28: 2 g via INTRAVENOUS

## 2016-06-28 MED ORDER — SODIUM CHLORIDE 0.9 % IV BOLUS (SEPSIS)
1000.0000 mL | Freq: Once | INTRAVENOUS | Status: AC
  Administered 2016-06-28: 1000 mL via INTRAVENOUS

## 2016-06-28 MED ORDER — 0.9 % SODIUM CHLORIDE (POUR BTL) OPTIME
TOPICAL | Status: DC | PRN
  Administered 2016-06-28: 1000 mL

## 2016-06-28 MED ORDER — SODIUM CHLORIDE 0.9% FLUSH: 9.0000 mL | INTRAVENOUS | Status: DC | PRN

## 2016-06-28 MED ORDER — HYDROMORPHONE HCL 1 MG/ML IJ SOLN
INTRAMUSCULAR | Status: AC
  Filled 2016-06-28: qty 0.5

## 2016-06-28 MED ORDER — ONDANSETRON HCL 4 MG PO TABS: 4.0000 mg | ORAL_TABLET | Freq: Four times a day (QID) | ORAL | Status: DC | PRN

## 2016-06-28 MED ORDER — POTASSIUM CHLORIDE IN NACL 20-0.9 MEQ/L-% IV SOLN
INTRAVENOUS | Status: DC
  Administered 2016-06-28 – 2016-07-02 (×8): via INTRAVENOUS
  Filled 2016-06-28 (×9): qty 1000

## 2016-06-28 MED ORDER — PROPOFOL 10 MG/ML IV BOLUS
INTRAVENOUS | Status: AC
  Filled 2016-06-28: qty 20

## 2016-06-28 MED ORDER — ALVIMOPAN 12 MG PO CAPS
12.0000 mg | ORAL_CAPSULE | Freq: Once | ORAL | Status: AC
  Administered 2016-06-28: 12 mg via ORAL
  Filled 2016-06-28: qty 1

## 2016-06-28 MED ORDER — ALVIMOPAN 12 MG PO CAPS: 12.0000 mg | ORAL_CAPSULE | Freq: Two times a day (BID) | ORAL | Status: DC

## 2016-06-28 MED ORDER — MIDAZOLAM HCL 2 MG/2ML IJ SOLN
INTRAMUSCULAR | Status: AC
  Filled 2016-06-28: qty 2

## 2016-06-28 MED ORDER — FENTANYL CITRATE (PF) 250 MCG/5ML IJ SOLN
INTRAMUSCULAR | Status: AC
  Filled 2016-06-28: qty 5

## 2016-06-28 MED ORDER — PHENYLEPHRINE HCL 10 MG/ML IJ SOLN
INTRAMUSCULAR | Status: DC | PRN
  Administered 2016-06-28: 40 ug via INTRAVENOUS
  Administered 2016-06-28 (×2): 80 ug via INTRAVENOUS
  Administered 2016-06-28: 40 ug via INTRAVENOUS

## 2016-06-28 MED ORDER — SUCCINYLCHOLINE CHLORIDE 200 MG/10ML IV SOSY
PREFILLED_SYRINGE | INTRAVENOUS | Status: AC
  Filled 2016-06-28: qty 10

## 2016-06-28 MED ORDER — ONDANSETRON HCL 4 MG/2ML IJ SOLN
4.0000 mg | Freq: Four times a day (QID) | INTRAMUSCULAR | Status: DC | PRN
  Administered 2016-06-28 – 2016-07-10 (×10): 4 mg via INTRAVENOUS
  Filled 2016-06-28 (×5): qty 2

## 2016-06-28 MED ORDER — PROPOFOL 10 MG/ML IV BOLUS
INTRAVENOUS | Status: DC | PRN
  Administered 2016-06-28: 50 mg via INTRAVENOUS
  Administered 2016-06-28: 150 mg via INTRAVENOUS

## 2016-06-28 MED ORDER — PROMETHAZINE HCL 25 MG/ML IJ SOLN
6.2500 mg | INTRAMUSCULAR | Status: AC | PRN
  Administered 2016-06-28 (×2): 6.25 mg via INTRAVENOUS

## 2016-06-28 MED ORDER — SODIUM CHLORIDE 0.9 % IV SOLN
Freq: Once | INTRAVENOUS | Status: AC
  Administered 2016-06-28: via INTRAVENOUS

## 2016-06-28 MED ORDER — LACTATED RINGERS IV SOLN
INTRAVENOUS | Status: DC | PRN
  Administered 2016-06-28 (×2): via INTRAVENOUS

## 2016-06-28 MED ORDER — FENTANYL CITRATE (PF) 100 MCG/2ML IJ SOLN
INTRAMUSCULAR | Status: AC
Start: 2016-06-28 — End: 2016-06-28
  Filled 2016-06-28: qty 2

## 2016-06-28 MED ORDER — ALUM & MAG HYDROXIDE-SIMETH 200-200-20 MG/5ML PO SUSP: 30.0000 mL | Freq: Four times a day (QID) | ORAL | Status: DC | PRN

## 2016-06-28 MED ORDER — HYDROMORPHONE HCL 1 MG/ML IJ SOLN
0.2500 mg | INTRAMUSCULAR | Status: DC | PRN
  Administered 2016-06-28 (×2): 0.5 mg via INTRAVENOUS

## 2016-06-28 MED ORDER — LIDOCAINE 2% (20 MG/ML) 5 ML SYRINGE
INTRAMUSCULAR | Status: AC
  Filled 2016-06-28: qty 10

## 2016-06-28 MED ORDER — ROCURONIUM BROMIDE 50 MG/5ML IV SOSY
PREFILLED_SYRINGE | INTRAVENOUS | Status: AC
  Filled 2016-06-28: qty 5

## 2016-06-28 MED ORDER — HYDROMORPHONE 1 MG/ML IV SOLN
INTRAVENOUS | Status: DC
  Administered 2016-06-28: 0.6 mg via INTRAVENOUS
  Administered 2016-06-28: 13:00:00 via INTRAVENOUS
  Administered 2016-06-28: 3 mg via INTRAVENOUS
  Administered 2016-06-29: 0.3 mg via INTRAVENOUS
  Administered 2016-06-29: 2.1 mg via INTRAVENOUS
  Administered 2016-06-29: 3 mg via INTRAVENOUS
  Administered 2016-06-29: 0.9 mg via INTRAVENOUS
  Administered 2016-06-29: 2.7 mg via INTRAVENOUS
  Administered 2016-06-29: 1.2 mg via INTRAVENOUS
  Administered 2016-06-30: 2.1 mg via INTRAVENOUS
  Administered 2016-06-30: 1.5 mg via INTRAVENOUS
  Administered 2016-06-30: 3.6 mg via INTRAVENOUS
  Administered 2016-06-30: 12:00:00 via INTRAVENOUS
  Administered 2016-06-30: 4.8 mg via INTRAVENOUS
  Administered 2016-07-01: 0.3 mg via INTRAVENOUS
  Administered 2016-07-01: 1.2 mg via INTRAVENOUS
  Administered 2016-07-01: 3 mg via INTRAVENOUS
  Administered 2016-07-01: 2.4 mg via INTRAVENOUS
  Administered 2016-07-02: 0 mg via INTRAVENOUS
  Administered 2016-07-02: 3.6 mg via INTRAVENOUS
  Filled 2016-06-28 (×2): qty 25

## 2016-06-28 MED ORDER — PHENYLEPHRINE 40 MCG/ML (10ML) SYRINGE FOR IV PUSH (FOR BLOOD PRESSURE SUPPORT)
PREFILLED_SYRINGE | INTRAVENOUS | Status: AC
  Filled 2016-06-28: qty 10

## 2016-06-28 MED ORDER — DIPHENHYDRAMINE HCL 50 MG/ML IJ SOLN
12.5000 mg | Freq: Four times a day (QID) | INTRAMUSCULAR | Status: DC | PRN
  Administered 2016-07-12 (×2): 12.5 mg via INTRAVENOUS
  Filled 2016-06-28 (×2): qty 1

## 2016-06-28 MED ORDER — SUGAMMADEX SODIUM 200 MG/2ML IV SOLN
INTRAVENOUS | Status: AC
  Filled 2016-06-28: qty 2

## 2016-06-28 MED ORDER — DIPHENHYDRAMINE HCL 12.5 MG/5ML PO ELIX
12.5000 mg | ORAL_SOLUTION | Freq: Four times a day (QID) | ORAL | Status: DC | PRN
Start: 2016-06-28 — End: 2016-07-13

## 2016-06-28 MED ORDER — EPHEDRINE 5 MG/ML INJ
INTRAVENOUS | Status: AC
  Filled 2016-06-28: qty 10

## 2016-06-28 MED ORDER — LIDOCAINE HCL (CARDIAC) 20 MG/ML IV SOLN
INTRAVENOUS | Status: DC | PRN
  Administered 2016-06-28: 80 mg via INTRAVENOUS

## 2016-06-28 SURGICAL SUPPLY — 65 items
BIOPATCH RED 1 DISK 7.0 (GAUZE/BANDAGES/DRESSINGS) ×2 IMPLANT
BIOPATCH RED 1IN DISK 7.0MM (GAUZE/BANDAGES/DRESSINGS) ×2
BLADE CLIPPER SURG (BLADE) IMPLANT
BLADE SURG 15 STRL LF DISP TIS (BLADE) IMPLANT
BLADE SURG 15 STRL SS (BLADE) ×3
CANISTER SUCT 3000ML PPV (MISCELLANEOUS) ×3 IMPLANT
CHLORAPREP W/TINT 26ML (MISCELLANEOUS) ×3 IMPLANT
COVER MAYO STAND STRL (DRAPES) ×5 IMPLANT
COVER SURGICAL LIGHT HANDLE (MISCELLANEOUS) ×5 IMPLANT
DRAIN CHANNEL 19F RND (DRAIN) ×4 IMPLANT
DRAPE LAPAROSCOPIC ABDOMINAL (DRAPES) ×3 IMPLANT
DRAPE PROXIMA HALF (DRAPES) ×1 IMPLANT
DRAPE UTILITY XL STRL (DRAPES) ×3 IMPLANT
DRAPE WARM FLUID 44X44 (DRAPE) ×3 IMPLANT
DRSG OPSITE POSTOP 3X4 (GAUZE/BANDAGES/DRESSINGS) ×2 IMPLANT
DRSG OPSITE POSTOP 4X10 (GAUZE/BANDAGES/DRESSINGS) ×2 IMPLANT
DRSG OPSITE POSTOP 4X8 (GAUZE/BANDAGES/DRESSINGS) IMPLANT
DRSG TEGADERM 2-3/8X2-3/4 SM (GAUZE/BANDAGES/DRESSINGS) ×4 IMPLANT
ELECT BLADE 6.5 EXT (BLADE) ×2 IMPLANT
ELECT CAUTERY BLADE 6.4 (BLADE) ×6 IMPLANT
ELECT REM PT RETURN 9FT ADLT (ELECTROSURGICAL) ×3
ELECTRODE REM PT RTRN 9FT ADLT (ELECTROSURGICAL) ×1 IMPLANT
EVACUATOR SILICONE 100CC (DRAIN) ×4 IMPLANT
GLOVE BIO SURGEON STRL SZ8 (GLOVE) ×8 IMPLANT
GLOVE BIOGEL PI IND STRL 8 (GLOVE) IMPLANT
GLOVE BIOGEL PI INDICATOR 8 (GLOVE) ×10
GLOVE ECLIPSE 8.0 STRL XLNG CF (GLOVE) ×4 IMPLANT
GLOVE SURG SIGNA 7.5 PF LTX (GLOVE) ×6 IMPLANT
GOWN STRL REUS W/ TWL LRG LVL3 (GOWN DISPOSABLE) ×4 IMPLANT
GOWN STRL REUS W/ TWL XL LVL3 (GOWN DISPOSABLE) ×2 IMPLANT
GOWN STRL REUS W/TWL LRG LVL3 (GOWN DISPOSABLE) ×6
GOWN STRL REUS W/TWL XL LVL3 (GOWN DISPOSABLE) ×12
KIT BASIN OR (CUSTOM PROCEDURE TRAY) ×3 IMPLANT
KIT ROOM TURNOVER OR (KITS) ×3 IMPLANT
LEGGING LITHOTOMY PAIR STRL (DRAPES) ×2 IMPLANT
LIGASURE IMPACT 36 18CM CVD LR (INSTRUMENTS) IMPLANT
NS IRRIG 1000ML POUR BTL (IV SOLUTION) ×6 IMPLANT
PACK GENERAL/GYN (CUSTOM PROCEDURE TRAY) ×3 IMPLANT
PAD ARMBOARD 7.5X6 YLW CONV (MISCELLANEOUS) ×3 IMPLANT
PENCIL BUTTON HOLSTER BLD 10FT (ELECTRODE) ×3 IMPLANT
RELOAD PROXIMATE 75MM BLUE (ENDOMECHANICALS) ×3 IMPLANT
RELOAD STAPLE 75 3.8 BLU REG (ENDOMECHANICALS) IMPLANT
SPECIMEN JAR MEDIUM (MISCELLANEOUS) ×3 IMPLANT
SPONGE LAP 18X18 X RAY DECT (DISPOSABLE) ×2 IMPLANT
STAPLER GUN LINEAR PROX 60 (STAPLE) ×2 IMPLANT
STAPLER PROXIMATE 75MM BLUE (STAPLE) ×2 IMPLANT
STAPLER VISISTAT 35W (STAPLE) ×5 IMPLANT
SUCTION POOLE TIP (SUCTIONS) ×3 IMPLANT
SURGILUBE 2OZ TUBE FLIPTOP (MISCELLANEOUS) IMPLANT
SUT ETHILON 2 0 FS 18 (SUTURE) ×4 IMPLANT
SUT NOVA 1 T20/GS 25DT (SUTURE) ×4 IMPLANT
SUT PDS AB 1 TP1 96 (SUTURE) ×6 IMPLANT
SUT PROLENE 2 0 CT2 30 (SUTURE) IMPLANT
SUT PROLENE 2 0 KS (SUTURE) IMPLANT
SUT SILK 2 0 SH CR/8 (SUTURE) ×3 IMPLANT
SUT SILK 2 0 TIES 10X30 (SUTURE) ×3 IMPLANT
SUT SILK 3 0 SH CR/8 (SUTURE) ×3 IMPLANT
SUT SILK 3 0 TIES 10X30 (SUTURE) ×3 IMPLANT
SYR BULB IRRIGATION 50ML (SYRINGE) ×3 IMPLANT
TOWEL OR 17X26 10 PK STRL BLUE (TOWEL DISPOSABLE) ×4 IMPLANT
TRAY FOLEY CATH 14FRSI W/METER (CATHETERS) ×3 IMPLANT
TRAY PROCTOSCOPIC FIBER OPTIC (SET/KITS/TRAYS/PACK) IMPLANT
TUBE CONNECTING 12'X1/4 (SUCTIONS) ×1
TUBE CONNECTING 12X1/4 (SUCTIONS) ×2 IMPLANT
YANKAUER SUCT BULB TIP NO VENT (SUCTIONS) ×5 IMPLANT

## 2016-06-28 NOTE — Transfer of Care (Signed)
Immediate Anesthesia Transfer of Care Note  Patient: Miguel [name]  Procedure(s) Performed: Procedure(s): COLOSTOMY TAKEDOWN (N/A)  Patient Location: PACU  Anesthesia Type:General  Level of Consciousness: awake, alert  and oriented  Airway & Oxygen Therapy: Patient Spontanous Breathing and Patient connected to nasal cannula oxygen  Post-op Assessment: Report given to RN, Post -op Vital signs reviewed and stable and Patient moving all extremities X 4  Post vital signs: Reviewed and stable  Last Vitals: There were no vitals filed for this visit.  Last Pain:  Vitals:   06/28/16 0824  PainSc: 4          Complications: No apparent anesthesia complications

## 2016-06-28 NOTE — Progress Notes (Signed)
Pt. Had 150 more output of bloody BM. Pt.'s heart rate staying around 127. MD notified. New orders placed.

## 2016-06-28 NOTE — Progress Notes (Signed)
Pt. Arrived to floor on the bedpan. I told pt. He needed to go ahead and get off of it so it wouldn't hurt his bottom. Pt. Refused. Instructed pt. To call out when he was ready to get off.

## 2016-06-28 NOTE — Anesthesia Procedure Notes (Signed)
Procedure Name: Intubation Date/Time: 06/28/2016 9:33 AM Performed by: Neldon Newport Pre-anesthesia Checklist: Timeout performed, Patient being monitored, Suction available, Emergency Drugs available and Patient identified Patient Re-evaluated:Patient Re-evaluated prior to inductionOxygen Delivery Method: Circle system utilized Preoxygenation: Pre-oxygenation with 100% oxygen Intubation Type: IV induction Ventilation: Mask ventilation without difficulty Laryngoscope Size: Mac and 3 Grade View: Grade I Tube type: Oral Tube size: 7.5 mm Number of attempts: 1 Placement Confirmation: breath sounds checked- equal and bilateral,  positive ETCO2 and ETT inserted through vocal cords under direct vision Secured at: 22 cm Tube secured with: Tape Dental Injury: Teeth and Oropharynx as per pre-operative assessment

## 2016-06-28 NOTE — Progress Notes (Signed)
100 cc of bright red bowel movement observed in bedpan. MD made aware. Per MD we will just continue to monitor for now.

## 2016-06-28 NOTE — Anesthesia Postprocedure Evaluation (Signed)
Anesthesia Post Note  Patient: Miguel [name]  Procedure(s) Performed: Procedure(s) (LRB): COLOSTOMY TAKEDOWN (N/A)  Patient location during evaluation: PACU Anesthesia Type: General Level of consciousness: awake and alert Pain management: pain level controlled Vital Signs Assessment: post-procedure vital signs reviewed and stable Respiratory status: spontaneous breathing, nonlabored ventilation and respiratory function stable Cardiovascular status: blood pressure returned to baseline and stable Postop Assessment: no signs of nausea or vomiting Anesthetic complications: no       Last Vitals:  Vitals:   06/28/16 1200 06/28/16 1215  BP: (!) 140/93 (!) 147/93  Pulse: 89 91  Resp: 20 16  Temp:      Last Pain:  Vitals:   06/28/16 0824  PainSc: [place] Ray Katrice Goel

## 2016-06-28 NOTE — Interval H&P Note (Signed)
History and Physical Interval Note: no change in H and P  06/28/2016 8:59 AM  Miguel [name]  has presented today for surgery, with the diagnosis of colostomy in place  The various methods of treatment have been discussed with the patient and family. After consideration of risks, benefits and other options for treatment, the patient has consented to  Procedure(s): COLOSTOMY TAKEDOWN (N/A) as a surgical intervention .  The patient's history has been reviewed, patient examined, no change in status, stable for surgery.  I have reviewed the patient's chart and labs.  Questions were answered to the patient's satisfaction.     Brayson Livesey A

## 2016-06-28 NOTE — Anesthesia Preprocedure Evaluation (Addendum)
Anesthesia Evaluation  Patient identified by MRN, date of birth, ID band Patient awake    Reviewed: Allergy & Precautions, NPO status   Airway Mallampati: II  TM Distance: >3 FB Neck ROM: Full    Dental  (+) Teeth Intact, Dental Advidsory Given   Pulmonary    breath sounds clear to auscultation       Cardiovascular hypertension,  Rhythm:Regular Rate:Normal     Neuro/Psych    GI/Hepatic   Endo/Other    Renal/GU Renal disease     Musculoskeletal   Abdominal   Peds  Hematology  (+) anemia ,   Anesthesia Other Findings   Reproductive/Obstetrics                            Anesthesia Physical  Anesthesia Plan  ASA: II  Anesthesia Plan: General   Post-op Pain Management:    Induction: Intravenous  Airway Management Planned: Oral ETT  Additional Equipment:   Intra-op Plan:   Post-operative Plan: Extubation in OR  Informed Consent: I have reviewed the patients History and Physical, chart, labs and discussed the procedure including the risks, benefits and alternatives for the proposed anesthesia with the patient or authorized representative who has indicated his/her understanding and acceptance.   Dental advisory given and Dental Advisory Given  Plan Discussed with:   Anesthesia Plan Comments:        Anesthesia Quick Evaluation

## 2016-06-28 NOTE — Op Note (Signed)
COLOSTOMY TAKEDOWN  Procedure Note  Miguel [name] 06/28/2016   Pre-op Diagnosis: colostomy in place, incisional hernia     Post-op Diagnosis: same  Procedure(s): COLOSTOMY TAKEDOWN SEGMENTAL COLECTOMY INCISIONAL HERNIA REPAIR  Surgeon(s): Jackolyn Confer, MD [name], MD  Anesthesia: General  Staff:  Circulator: Beryle Lathe, RN Scrub Person: Adella Hare Circulator Assistant: Quincy Carnes, RN  Estimated Blood Loss: Minimal               Specimens: sent to path  Indications: This is a 66 year old gentleman who last year underwent a Hartman's procedure for perforated sigmoid diverticulitis. Later that year, he had emergent surgery for a bleeding gastric ulcer requiring a resection and B2 anastomosis. He then developed worsening of a hiatal hernia and small bowel perforation requiring emergent surgery. He now presents for colostomy takedown with repair of an incisional hernia.  Procedure: The patient was brought to the operating room and identified as correct patient. He was placed supine on the operating room table and general anesthesia was induced. His previous ostomy bag was removed and the ostomy was closed the skin with a running 2-0 silk suture. He was then placed in the lithotomy position and a Foley catheter was inserted. His abdomen and perineum were then prepped and draped in the usual sterile fashion. I then created a midline incision by removing his previous large midline scar with the scalpel. I took this down through the hernia sac and excised the skin and sent entirety with electrocautery. Upon entering the abdomen, there were minimal adhesions to the abdominal wall which I took down with electrocautery. The ostomy was easy to identify as well as the rectal stump. At this point, no further lysis of adhesions was necessary. I made an elliptical incision around the ostomy site with the scalpel and then dissected down to the fascia circumferentially with  electrocautery. The colostomy was then reduced back into the abdominal cavity. The colon fit easily down into the pelvis. I took down the mesentery around the colostomy with silk sutures. I then performed a segmental colectomy removing the ostomy with a single firing of the GIA-75 stapler. The segmental piece of colon including the ostomy was sent to pathology for evaluation. I then reapproximated the descending colon to the rectum in a side-to-side fashion with interrupted silk sutures. I then performed to colotomies with the cautery. I then performed a side-to-side anastomosis with the GIA-75 stapler. The open end was then closed with a TX 60 stapler. I then sewed some loose omentum over the top of the anastomosis with silk sutures. The anastomosis appeared pink and well-perfused with no tension and the opening appeared wide. At this point I copiously irrigated the abdomen with normal saline. We then re-changed our gown and gloves and placed new drape standard sterile technique. At this point I then mobilized the skin off the top of the midline fascia with electrocautery circumferentially. I had to take this past the ostomy defect. Again, he had a large incisional hernia defect. I had to dissect out laterally and the performed lasting incisions in the anterior fascia on both sides in order to be able to close the midline fascia. I then closed the ostomy site with interrupted #1 no fill sutures in 2 layers. I was unable to close the midline fascia with a running #1 looped PDS suture. We then irrigated again with saline. I made 2 separate skin incisions and then placed two 19 French Blake drains over the top of  the fascia.  These were sutured in place with nylon sutures. I then closed both incisions with skin staples. Occlusive dressing was then applied. The patient tolerated procedure well. All the counts were correct at the end of the procedure. The patient was then asked abated in the operating room and taken in  a stable condition to the recovery room.          Shiana Rappleye A   Date: 06/28/2016  Time: 11:22 AM

## 2016-06-29 ENCOUNTER — Encounter (HOSPITAL_COMMUNITY): Payer: Self-pay | Admitting: Surgery

## 2016-06-29 LAB — BASIC METABOLIC PANEL
Anion gap: 7 (ref 5–15)
BUN: 22 mg/dL — AB (ref 6–20)
CALCIUM: 8 mg/dL — AB (ref 8.9–10.3)
CO2: 24 mmol/L (ref 22–32)
Chloride: 108 mmol/L (ref 101–111)
Creatinine, Ser: 1.77 mg/dL — ABNORMAL HIGH (ref 0.61–1.24)
GFR calc Af Amer: 44 mL/min — ABNORMAL LOW (ref >60)
GFR, EST NON AFRICAN AMERICAN: 38 mL/min — AB (ref >60)
GLUCOSE: 141 mg/dL — AB (ref 65–99)
POTASSIUM: 4.5 mmol/L (ref 3.5–5.1)
Sodium: 139 mmol/L (ref 135–145)

## 2016-06-29 LAB — CBC
HEMATOCRIT: 29.8 % — AB (ref 39.0–52.0)
Hemoglobin: 10.1 g/dL — ABNORMAL LOW (ref 13.0–17.0)
MCH: 30.1 pg (ref 26.0–34.0)
MCHC: 33.9 g/dL (ref 30.0–36.0)
MCV: 89 fL (ref 78.0–100.0)
Platelets: 190 10*3/uL (ref 150–400)
RBC: 3.35 MIL/uL — ABNORMAL LOW (ref 4.22–5.81)
RDW: 15.5 % (ref 11.5–15.5)
WBC: 11.7 10*3/uL — ABNORMAL HIGH (ref 4.0–10.5)

## 2016-06-29 LAB — PREPARE FRESH FROZEN PLASMA: UNIT DIVISION: 0

## 2016-06-29 LAB — BPAM FFP
Blood Product Expiration Date: 201804072359
ISSUE DATE / TIME: 201804022326
Unit Type and Rh: 6200

## 2016-06-29 NOTE — Progress Notes (Signed)
Nurse tech successfully did in and out cath and got 300 out.

## 2016-06-29 NOTE — Progress Notes (Signed)
Notified Dr Ninfa Linden of patient's BP 87/61 and HR 122. Dr. Ninfa Linden stated to proceed with transfusion of 2nd unit of RBC

## 2016-06-29 NOTE — Progress Notes (Signed)
Per the [facility] nursing instructor, they attempted to do an in and out cath but met resistance and pt. Was yelling in pain. So they stopped and did not continue with the in and out cath. Pt. Did void 175, so we will continue to monitor his output and page an MD accordingly.

## 2016-06-29 NOTE — Consult Note (Signed)
   [facility] CM Inpatient Consult   06/29/2016  Miguel [name] 12-05-1950 [id number]    Came to visit [name] at bedside on behalf of [place] to Wellness program for [name] with [facility] insurance. His wife is a Furniture conservator/restorer.He denies having any current [place] to Wellness needs. Mr. Bean is well known to writer from past hospitalizations. He endorses that he is feeling better today. Made him aware that Link to [place] will follow up post discharge. He is agreeable. Provided Link to [employer] and contact information. Appreciative of the visit. Will continue to follow along.   [name], MSN-Ed, RN,BSN [facility] Liaison [phone]

## 2016-06-29 NOTE — Progress Notes (Signed)
1 Day Post-Op  Subjective: He reports feeling much better this morning.  Had multiple bloody BM's last evening which now seem to have stopped  Objective: Vital signs in last 24 hours: Temp:  [97.3 F (36.3 C)-99.8 F (37.7 C)] 97.9 F (36.6 C) (04/03 0505) Pulse Rate:  [86-139] 120 (04/03 0505) Resp:  [15-21] 17 (04/03 0505) BP: (81-147)/(54-93) 81/54 (04/03 0505) SpO2:  [93 %-100 %] 93 % (04/03 0505) Weight:  [62.6 kg (138 lb)] 62.6 kg (138 lb) (04/02 0824) Last BM Date: 06/28/16  Intake/Output from previous day: 04/02 0701 - 04/03 0700 In: 2760 [I.V.:2100; Blood:610; IV Piggyback:50] Out: 2251 [Urine:1100; Emesis/NG output:151; Drains:300; Stool:650; Blood:50] Intake/Output this shift: Total I/O In: 1110 [I.V.:500; Blood:610] Out: 970 [Urine:250; Emesis/NG output:150; Drains:170; Stool:400]  Exam: Appears comfortable Color looks good Abdomen is soft  Lab Results:   Recent Labs  06/28/16 1747  WBC 13.3*  HGB 11.6*  HCT 35.3*  PLT 259   BMET No results for input(s): NA, K, CL, CO2, GLUCOSE, BUN, CREATININE, CALCIUM in the last 72 hours. PT/INR No results for input(s): LABPROT, INR in the last 72 hours. ABG No results for input(s): PHART, HCO3 in the last 72 hours.  Invalid input(s): PCO2, PO2  Studies/Results: No results found.  Anti-infectives: Anti-infectives    Start     Dose/Rate Route Frequency Ordered Stop   06/28/16 0920  cefoTEtan in Dextrose 5% (CEFOTAN) 2-2.08 GM-% IVPB  Status:  Discontinued    Comments:  Zeek, Rostron   : cabinet override      06/28/16 0920 06/28/16 0924   06/28/16 0830  cefoTEtan (CEFOTAN) 2 g in dextrose 5 % 50 mL IVPB     2 g 100 mL/hr over 30 Minutes Intravenous To Short Stay 06/28/16 0814 06/28/16 0941   06/28/16 0818  cefoTEtan in Dextrose 5% (CEFOTAN) 2-2.08 GM-% IVPB    Comments:  Precious Haws   : cabinet override      06/28/16 0818 06/28/16 2029      Assessment/Plan: s/p Procedure(s): COLOSTOMY TAKEDOWN  (N/A)  Post op bleeding from colorectal anastomosis.  Transfusion of PRBC in progress.  Repeat CBC following 2nd unit which is in progress D/c foley Clear liquid diet ambulate  LOS: 1 day    Estill Llerena A 06/29/2016

## 2016-06-29 NOTE — Progress Notes (Signed)
Pt. Has not voided since taking foley out. Bladder scan showed 498. Paged [facility] to get [facility] of Dr. Ninfa Linden. Per the nurse station at [place], they stated it was a rule that they cannot page doctors if they are not on call. I told her it was a private pt. Of Dr. Ninfa Linden and she still stated she could not call him. She offered to page a PA. I spoke to [employer], who put in new orders to in and out cath the pt. Per Jerene Pitch, if the pt. Does not void again within 6 hours of doing an in and out cath, RN staff can place a foley and leave it.

## 2016-06-30 ENCOUNTER — Other Ambulatory Visit: Payer: Self-pay

## 2016-06-30 LAB — CBC
HEMATOCRIT: 24.4 % — AB (ref 39.0–52.0)
Hemoglobin: 8.1 g/dL — ABNORMAL LOW (ref 13.0–17.0)
MCH: 29.7 pg (ref 26.0–34.0)
MCHC: 33.2 g/dL (ref 30.0–36.0)
MCV: 89.4 fL (ref 78.0–100.0)
PLATELETS: 166 10*3/uL (ref 150–400)
RBC: 2.73 MIL/uL — AB (ref 4.22–5.81)
RDW: 15.6 % — ABNORMAL HIGH (ref 11.5–15.5)
WBC: 8.1 10*3/uL (ref 4.0–10.5)

## 2016-06-30 LAB — PROTIME-INR
INR: 1.29
Prothrombin Time: 16.1 seconds — ABNORMAL HIGH (ref 11.4–15.2)

## 2016-06-30 LAB — APTT: APTT: 32 s (ref 24–36)

## 2016-06-30 LAB — PREPARE RBC (CROSSMATCH)

## 2016-06-30 MED ORDER — SODIUM CHLORIDE 0.9 % IV SOLN
Freq: Once | INTRAVENOUS | Status: AC
  Administered 2016-06-30: 17:00:00 via INTRAVENOUS

## 2016-06-30 MED ORDER — ACETAMINOPHEN 10 MG/ML IV SOLN
1000.0000 mg | Freq: Once | INTRAVENOUS | Status: AC
Start: 2016-06-30 — End: 2016-06-30
  Administered 2016-06-30: 1000 mg via INTRAVENOUS
  Filled 2016-06-30: qty 100

## 2016-06-30 MED ORDER — SODIUM CHLORIDE 0.9 % IV SOLN
Freq: Once | INTRAVENOUS | Status: AC
  Administered 2016-06-30: 08:00:00 via INTRAVENOUS

## 2016-06-30 MED ORDER — KETOROLAC TROMETHAMINE 30 MG/ML IJ SOLN
30.0000 mg | Freq: Once | INTRAMUSCULAR | Status: AC
  Administered 2016-06-30: 30 mg via INTRAVENOUS
  Filled 2016-06-30: qty 1

## 2016-06-30 MED ORDER — ACETAMINOPHEN 325 MG PO TABS
650.0000 mg | ORAL_TABLET | Freq: Four times a day (QID) | ORAL | Status: DC | PRN
  Administered 2016-06-30 – 2016-07-09 (×11): 650 mg via ORAL
  Filled 2016-06-30 (×12): qty 2

## 2016-06-30 MED ORDER — MORPHINE SULFATE (PF) 2 MG/ML IV SOLN
2.0000 mg | INTRAVENOUS | Status: DC | PRN
  Administered 2016-07-03 – 2016-07-11 (×27): 2 mg via INTRAVENOUS
  Filled 2016-06-30 (×27): qty 1

## 2016-06-30 NOTE — Progress Notes (Signed)
   06/30/16 1350  Vitals  Temp (!) 102.5 F (39.2 C)  Temp Source Oral  BP (!) 155/93  BP Location Right Arm  BP Method Automatic  Patient Position (if appropriate) Lying  Pulse Rate (!) 126  Pulse Rate Source Dinamap  Resp (!) 26  Oxygen Therapy  SpO2 95 %  O2 Device Nasal Cannula  O2 Flow Rate (L/min) 2 L/min  Pt running a fever as charted during blood transfusion. Rate reduced to 139ml/hr. MD came to check on pt and advised to keep transfusion going and administer tylenol. MD will order po tylenol. Pt monitoring continues

## 2016-06-30 NOTE — Progress Notes (Signed)
Patient ID: Miguel [name], male   DOB: [birth year], 66 y.o.   MRN: [id number]  Patient had been feeling well.  Now with increasing abdominal pain over the past hour and had a bloody diarrhea BM with what looks like old blood. Transfusion is in progress and his abdomen is soft.  Will continue to monitor but will have to consider going back to the OR for a possible proctoscopy vs potential laparotomy.

## 2016-06-30 NOTE — Progress Notes (Signed)
2nd unit of blood transfused with no signs of reaction noted. Pt reports feeling much better now

## 2016-06-30 NOTE — Progress Notes (Signed)
Pt has since had 2 other episodes of thick bloody discharge since last entry. Averaging 100cc

## 2016-06-30 NOTE — Progress Notes (Signed)
Witnessed two episodes of dark thick bloody stools amounting to 150cc each occasion. MD aware of findings

## 2016-06-30 NOTE — Progress Notes (Signed)
2nd unit of plasma administered without incident,second unit of blood transfusing. Monitoring pt closely

## 2016-06-30 NOTE — Progress Notes (Signed)
Ist unit of blood completed. Second unit of plasma transfusing as ordered

## 2016-06-30 NOTE — Progress Notes (Signed)
Plasma transfusing as ordered.One unit PRBC to follow later today

## 2016-06-30 NOTE — Progress Notes (Signed)
2 Days Post-Op  Subjective: Feeling much better No nausea Voiding well Had trace blood in BM last night  Objective: Vital signs in last 24 hours: Temp:  [98.8 F (37.1 C)-99.3 F (37.4 C)] 98.9 F (37.2 C) (04/04 0502) Pulse Rate:  [102-110] 103 (04/04 0502) Resp:  [15-17] 17 (04/04 0502) BP: (91-109)/(56-63) 109/57 (04/04 0502) SpO2:  [93 %-96 %] 96 % (04/04 0502) Last BM Date: 06/28/16  Intake/Output from previous day: 04/03 0701 - 04/04 0700 In: 2764 [P.O.:220; I.V.:2200; Blood:344] Out: 2930 [Urine:1150; Drains:1780] Intake/Output this shift: No intake/output data recorded.  Exam: Well in appearance  Lungs clear CV mildly tachy Abdomen soft, drains serosang  Lab Results:   Recent Labs  06/29/16 1143 06/30/16 0621  WBC 11.7* 8.1  HGB 10.1* 8.1*  HCT 29.8* 24.4*  PLT 190 166   BMET  Recent Labs  06/29/16 1143  NA 139  K 4.5  CL 108  CO2 24  GLUCOSE 141*  BUN 22*  CREATININE 1.77*  CALCIUM 8.0*   PT/INR No results for input(s): LABPROT, INR in the last 72 hours. ABG No results for input(s): PHART, HCO3 in the last 72 hours.  Invalid input(s): PCO2, PO2  Studies/Results: No results found.  Anti-infectives: Anti-infectives    Start     Dose/Rate Route Frequency Ordered Stop   06/28/16 0920  cefoTEtan in Dextrose 5% (CEFOTAN) 2-2.08 GM-% IVPB  Status:  Discontinued    Comments:  Miguel [name], Miguel [name]   : cabinet override      06/28/16 0920 06/28/16 0924   06/28/16 0830  cefoTEtan (CEFOTAN) 2 g in dextrose 5 % 50 mL IVPB     2 g 100 mL/hr over 30 Minutes Intravenous To Short Stay 06/28/16 0814 06/28/16 0941   06/28/16 0818  cefoTEtan in Dextrose 5% (CEFOTAN) 2-2.08 GM-% IVPB    Comments:  Miguel [name]   : cabinet override      06/28/16 0818 06/28/16 2029      Assessment/Plan: s/p Procedure(s): COLOSTOMY TAKEDOWN (N/A)  Blood loss anemia from bleeding post op at anastomosis.  Appears to be slowing.  Will transfuse I more unit of PRBC and  FFP. Try full liquids ambulate  LOS: 2 days    Miguel [name] A 06/30/2016

## 2016-07-01 LAB — BPAM FFP
Blood Product Expiration Date: 201804072359
Blood Product Expiration Date: 201804072359
ISSUE DATE / TIME: 201804040848
ISSUE DATE / TIME: 201804041450
Unit Type and Rh: 6200
Unit Type and Rh: 6200

## 2016-07-01 LAB — CBC
HEMATOCRIT: 37.6 % — AB (ref 39.0–52.0)
Hemoglobin: 12.7 g/dL — ABNORMAL LOW (ref 13.0–17.0)
MCH: 29.7 pg (ref 26.0–34.0)
MCHC: 33.8 g/dL (ref 30.0–36.0)
MCV: 88.1 fL (ref 78.0–100.0)
Platelets: 162 10*3/uL (ref 150–400)
RBC: 4.27 MIL/uL (ref 4.22–5.81)
RDW: 14.9 % (ref 11.5–15.5)
WBC: 4.7 10*3/uL (ref 4.0–10.5)

## 2016-07-01 LAB — TYPE AND SCREEN
ABO/RH(D): A POS
Antibody Screen: NEGATIVE
UNIT DIVISION: 0
UNIT DIVISION: 0
Unit division: 0
Unit division: 0

## 2016-07-01 LAB — PREPARE FRESH FROZEN PLASMA
UNIT DIVISION: 0
Unit division: 0

## 2016-07-01 LAB — BPAM RBC
BLOOD PRODUCT EXPIRATION DATE: 201804142359
BLOOD PRODUCT EXPIRATION DATE: 201804142359
BLOOD PRODUCT EXPIRATION DATE: 201804172359
Blood Product Expiration Date: 201804222359
ISSUE DATE / TIME: 201804030122
ISSUE DATE / TIME: 201804041555
ISSUE DATE / TIME: 201804030434
ISSUE DATE / TIME: 201804041139
UNIT TYPE AND RH: 6200
Unit Type and Rh: 6200
Unit Type and Rh: 6200
Unit Type and Rh: 6200

## 2016-07-01 LAB — BASIC METABOLIC PANEL
ANION GAP: 9 (ref 5–15)
BUN: 18 mg/dL (ref 6–20)
CALCIUM: 7.9 mg/dL — AB (ref 8.9–10.3)
CO2: 23 mmol/L (ref 22–32)
CREATININE: 0.91 mg/dL (ref 0.61–1.24)
Chloride: 106 mmol/L (ref 101–111)
GFR calc Af Amer: >60 mL/min (ref >60)
GFR calc non Af Amer: >60 mL/min (ref >60)
GLUCOSE: 101 mg/dL — AB (ref 65–99)
Potassium: 4.6 mmol/L (ref 3.5–5.1)
Sodium: 138 mmol/L (ref 135–145)

## 2016-07-01 MED ORDER — METOCLOPRAMIDE HCL 5 MG/ML IJ SOLN
10.0000 mg | Freq: Once | INTRAMUSCULAR | Status: AC
  Administered 2016-07-01: 10 mg via INTRAVENOUS
  Filled 2016-07-01: qty 2

## 2016-07-01 NOTE — Progress Notes (Signed)
Patient c/o of nausea. RN administered PRN Zofran 4mg , 10 mins later patient states that he had no relief. RN administered PRN Phenergan 12.5 mg. Patient called RN in room and states that he the nausea did not improve. RN paged on call surgery, Hulen Skains, MD. Verbal order for 10mg  of Reglan given. RN will administer and reassess patient.   Ermalinda Memos, RN

## 2016-07-01 NOTE — Progress Notes (Signed)
3 Days Post-Op  Subjective: Feeling better this morning.  Still with nausea and pain but improved No further bleeding seen yet  Objective: Vital signs in last 24 hours: Temp:  [98.9 F (37.2 C)-102.5 F (39.2 C)] 98.9 F (37.2 C) (04/05 0713) Pulse Rate:  [105-128] 120 (04/05 0716) Resp:  [10-26] 12 (04/05 0710) BP: (113-155)/(67-93) 128/73 (04/05 0559) SpO2:  [94 %-100 %] 94 % (04/05 0710) Last BM Date: 06/30/16  Intake/Output from previous day: 04/04 0701 - 04/05 0700 In: 2956.8 [P.O.:360; I.V.:1437.5; Blood:1159.3] Out: 1960 [Urine:1400; Drains:560] Intake/Output this shift: Total I/O In: 535 [I.V.:535] Out: 100 [Urine:100]  Exam: Looks comfortable Awake and alert Abdomen remains soft, drains serosang  Lab Results:   Recent Labs  06/30/16 0621 07/01/16 0313  WBC 8.1 4.7  HGB 8.1* 12.7*  HCT 24.4* 37.6*  PLT 166 162   BMET  Recent Labs  06/29/16 1143 07/01/16 0313  NA 139 138  K 4.5 4.6  CL 108 106  CO2 24 23  GLUCOSE 141* 101*  BUN 22* 18  CREATININE 1.77* 0.91  CALCIUM 8.0* 7.9*   PT/INR  Recent Labs  06/30/16 1605  LABPROT 16.1*  INR 1.29   ABG No results for input(s): PHART, HCO3 in the last 72 hours.  Invalid input(s): PCO2, PO2  Studies/Results: No results found.  Anti-infectives: Anti-infectives    Start     Dose/Rate Route Frequency Ordered Stop   06/28/16 0920  cefoTEtan in Dextrose 5% (CEFOTAN) 2-2.08 GM-% IVPB  Status:  Discontinued    Comments:  Ellie, Spickler   : cabinet override      06/28/16 0920 06/28/16 0924   06/28/16 0830  cefoTEtan (CEFOTAN) 2 g in dextrose 5 % 50 mL IVPB     2 g 100 mL/hr over 30 Minutes Intravenous To Short Stay 06/28/16 0814 06/28/16 0941   06/28/16 0818  cefoTEtan in Dextrose 5% (CEFOTAN) 2-2.08 GM-% IVPB    Comments:  Precious Haws   : cabinet override      06/28/16 0818 06/28/16 2029      Assessment/Plan: s/p Procedure(s): COLOSTOMY TAKEDOWN (N/A)    LOS: 3 days   Post op  bleed from anastomosis.  Appears to have stopped.  Hgb up significantly post transfusion  Severe pain expected given extent of hernia repair.  Continue pain control.  Hold on advancing diet  Lashannon Bresnan A 07/01/2016

## 2016-07-01 NOTE — Progress Notes (Signed)
Dr. Ninfa Linden aware at bedside of patient's HR 120s-130s. Patient resting comfortably in bed and in and out of sleep. Arousable. Pt appears to be in no acute distress.

## 2016-07-02 MED ORDER — SODIUM CHLORIDE 0.9 % IV SOLN
12.5000 mg | Freq: Four times a day (QID) | INTRAVENOUS | Status: DC | PRN
  Administered 2016-07-02 – 2016-07-08 (×5): 12.5 mg via INTRAVENOUS
  Filled 2016-07-02 (×12): qty 0.5

## 2016-07-02 MED ORDER — HYDROMORPHONE HCL 1 MG/ML IJ SOLN
1.0000 mg | INTRAMUSCULAR | Status: DC | PRN
Start: 2016-07-02 — End: 2016-07-03
  Administered 2016-07-02 – 2016-07-03 (×8): 1 mg via INTRAVENOUS
  Filled 2016-07-02 (×9): qty 1

## 2016-07-02 MED ORDER — OSMOLITE 1.2 CAL PO LIQD
1000.0000 mL | ORAL | Status: DC
  Administered 2016-07-02: 1000 mL
  Filled 2016-07-02 (×2): qty 1000

## 2016-07-02 MED ORDER — OXYCODONE HCL 5 MG PO TABS
5.0000 mg | ORAL_TABLET | ORAL | Status: DC | PRN
  Administered 2016-07-02 (×2): 5 mg via ORAL
  Administered 2016-07-03 – 2016-07-09 (×19): 10 mg via ORAL
  Filled 2016-07-02 (×19): qty 2
  Filled 2016-07-02: qty 1
  Filled 2016-07-02: qty 2

## 2016-07-02 NOTE — Progress Notes (Signed)
Patient history for dilaudid PCA received 2.4mg  prior to d/c of drip.

## 2016-07-02 NOTE — Progress Notes (Signed)
Initial Nutrition Assessment  DOCUMENTATION CODES:   Not applicable  INTERVENTION:   -RD will follow for diet advancement -Continue Osmolite 1.2 @ 30 ml/hr via PEG   Tube feeding regimen provides864 kcals, 40 grams protein, and 590 ml fluid daily (51% of estimated needs and 44% of estimated protein needs).    -Recommend transitioning to home TF regimen:  InitiateOsmolite 1.5 @ 55 ml/hr via PEG over 18 hours with 75 ml free water flush 4 times per day. Complete regimen provides 1485 kcals, 62 grams protein, and 1054 ml free water (provides 87% of estimated kcal needs and 69% of estimated protein needs).    NUTRITION DIAGNOSIS:   Inadequate oral intake related to altered GI function as evidenced by  (PEG dependent).  GOAL:   Patient will meet greater than or equal to 90% of their needs  MONITOR:   PO intake, Diet advancement, Labs, TF tolerance, Skin, I & O's  REASON FOR ASSESSMENT:   Other (Comment) (New TF)    ASSESSMENT:   66 year old male s/p emergent Hartman's procedure for perforated sigmoid diverticulitis almost one year ago.  Since that time, he has had emergent gastric surgery and has a feeding tube in place.  He has recovered well from that surgery and wishes to proceed with a colostomy takedown.  He currently has no complaints  S/p Procedure(s) 06/28/16: COLOSTOMY TAKEDOWN SEGMENTAL COLECTOMY INCISIONAL HERNIA REPAIR  Pt advanced to full liquid diet on 06/30/16.   Called and spoke with Janett Billow of [place] [facility] Team. She informed this RD of home regimen of Osmolite 1.5 @ 55 ml/hr over 18 hours with 75 ml free water flush 4 times per day. Complete regimen provides 1485 kcals, 62 grams protein, and 1054 ml free water (provides 87% of estimated kcal needs and 69% of estimated protein needs).   TF re-started today. Osmolite 1.2 currently infusing at 30 ml/hr via PEG. Regimen providing 864 kcals, 40 grams protein, and 590 ml fluid daily (51% of  estimated needs and 44% of estimated protein needs).   Spoke with pt and wife at bedside. Pt reports feeling great and tolerating liquid diet well. Noted pt consumed only about a half cup of cranberry juice for lunch. Per pt wife, this is typical for him. Intake is typically small amounts of liquids and some solid foods throughout the day- he has difficulty digesting dairy and milk based products. He has been consuming a lot of [place] stew lately.   Pt reports wt stability over the past several months. Noted a 19.7% wt loss over the past year, which is trending towards significant.   Pt reports compliance with home TF regimen- both pt and wife able to accurately repeat TF regimen provided by [facility]. Pt denies any abdominal pain, nausea, or vomiting with current TF.   Nutrition-Focused physical exam completed. Findings are no fat depletion, mild muscle depletion, and no edema. Exam is much improved from RD's recollection from last visit.   Labs reviewed.  Diet Order:  Diet full liquid Room service appropriate? Yes; Fluid consistency: Thin  Skin:  Reviewed, no issues  Last BM:  06/30/16  Height:   Ht Readings from Last 1 Encounters:  06/29/16 5\' 6"  (1.676 m)    Weight:   Wt Readings from Last 1 Encounters:  06/29/16 138 lb 14.2 oz (63 kg)    Ideal Body Weight:  64.5 kg  BMI:  Body mass index is 22.42 kg/m.  Estimated Nutritional Needs:   Kcal:  1700-1900  Protein:  90-105 grams  Fluid:  1.7-1.9 L  EDUCATION NEEDS:   Education needs addressed  Yeraldine Forney A. Jimmye Norman, RD, LDN, CDE Pager: [phone] After hours Pager: [phone]

## 2016-07-02 NOTE — Care Management Note (Signed)
Case Management Note  Patient Details  Name: Miguel [name] MRN: [id number] Date of Birth: 01/05/1951  Subjective/Objective:  Pt admitted for colostomy takedown                  Action/Plan:   PTA completely independent from home with wife.  Pt had colostomy and tube feeds for approximately 1 year.  Pt will discharge home with tube feeds - will like to continue to use [facility] - agency contacted and informed of admit - will resume services at discharge.  CM requested resumption order via physician sticky notes   Expected Discharge Date:                  Expected Discharge Plan:  Home/Self Care  In-House Referral:     Discharge planning Services  CM Consult  Post Acute Care Choice:    Choice offered to:     DME Arranged:    DME Agency:     HH Arranged:    HH Agency:     Status of Service:  In process, will continue to follow  If discussed at Long Length of Stay Meetings, dates discussed:    Additional Comments:  Maryclare Labrador, RN 07/02/2016, 10:53 AM

## 2016-07-02 NOTE — Progress Notes (Signed)
Dr. Brantley Stage paged to make aware of patient's fever 102.40F at 1530 and now 101F at 1745. Tylenol was given at 1530. Patient denies any pain and did not feel like he had a fever. Dr. Brantley Stage verbal order to recheck temp in 4 hours and give tylenol. If fever persists to call MD back.

## 2016-07-02 NOTE — Progress Notes (Signed)
dilaudid pca syringe and tubing wasted with lauren, RN

## 2016-07-02 NOTE — Progress Notes (Signed)
4 Days Post-Op  Subjective: Feeling better No blood per rectum  Objective: Vital signs in last 24 hours: Temp:  [98.7 F (37.1 C)-100 F (37.8 C)] 98.7 F (37.1 C) (04/06 0521) Pulse Rate:  [108-144] 108 (04/06 0521) Resp:  [10-18] 18 (04/06 0521) BP: (107-124)/(65-75) 107/67 (04/06 0521) SpO2:  [40 %-100 %] 97 % (04/06 0521) FiO2 (%):  [96 %] 96 % (04/06 0432) Last BM Date: 06/30/16  Intake/Output from previous day: 04/05 0701 - 04/06 0700 In: 2415 [P.O.:150; I.V.:2250] Out: 2635 [Urine:505; Drains:2130] Intake/Output this shift: No intake/output data recorded.  Exam: Abdomen soft, non-distended, drain serosang  Lab Results:   Recent Labs  06/30/16 0621 07/01/16 0313  WBC 8.1 4.7  HGB 8.1* 12.7*  HCT 24.4* 37.6*  PLT 166 162   BMET  Recent Labs  06/29/16 1143 07/01/16 0313  NA 139 138  K 4.5 4.6  CL 108 106  CO2 24 23  GLUCOSE 141* 101*  BUN 22* 18  CREATININE 1.77* 0.91  CALCIUM 8.0* 7.9*   PT/INR  Recent Labs  06/30/16 1605  LABPROT 16.1*  INR 1.29   ABG No results for input(s): PHART, HCO3 in the last 72 hours.  Invalid input(s): PCO2, PO2  Studies/Results: No results found.  Anti-infectives: Anti-infectives    Start     Dose/Rate Route Frequency Ordered Stop   06/28/16 0920  cefoTEtan in Dextrose 5% (CEFOTAN) 2-2.08 GM-% IVPB  Status:  Discontinued    Comments:  Gabe, Glace   : cabinet override      06/28/16 0920 06/28/16 0924   06/28/16 0830  cefoTEtan (CEFOTAN) 2 g in dextrose 5 % 50 mL IVPB     2 g 100 mL/hr over 30 Minutes Intravenous To Short Stay 06/28/16 0814 06/28/16 0941   06/28/16 0818  cefoTEtan in Dextrose 5% (CEFOTAN) 2-2.08 GM-% IVPB    Comments:  Precious Haws   : cabinet override      06/28/16 0818 06/28/16 2029      Assessment/Plan: s/p Procedure(s): COLOSTOMY TAKEDOWN (N/[name])  D/c PCA Start tube feeds Advance diet  LOS: 4 days    Miguel [name] 07/02/2016

## 2016-07-03 LAB — CBC
HEMATOCRIT: 32.7 % — AB (ref 39.0–52.0)
Hemoglobin: 10.6 g/dL — ABNORMAL LOW (ref 13.0–17.0)
MCH: 29 pg (ref 26.0–34.0)
MCHC: 32.4 g/dL (ref 30.0–36.0)
MCV: 89.3 fL (ref 78.0–100.0)
Platelets: 227 10*3/uL (ref 150–400)
RBC: 3.66 MIL/uL — ABNORMAL LOW (ref 4.22–5.81)
RDW: 14.8 % (ref 11.5–15.5)
WBC: 8.3 10*3/uL (ref 4.0–10.5)

## 2016-07-03 MED ORDER — FREE WATER
75.0000 mL | Freq: Four times a day (QID) | Status: DC
  Administered 2016-07-03 – 2016-07-05 (×8): 75 mL

## 2016-07-03 MED ORDER — OSMOLITE 1.2 CAL PO LIQD
1000.0000 mL | ORAL | Status: DC
  Administered 2016-07-03: 1000 mL
  Filled 2016-07-03 (×6): qty 1000

## 2016-07-03 MED ORDER — HYDROMORPHONE HCL 1 MG/ML IJ SOLN
1.0000 mg | INTRAMUSCULAR | Status: DC | PRN
  Administered 2016-07-03 – 2016-07-10 (×47): 1 mg via INTRAVENOUS
  Filled 2016-07-03 (×49): qty 1

## 2016-07-03 MED ORDER — SODIUM CHLORIDE 0.9 % IV BOLUS (SEPSIS)
500.0000 mL | Freq: Once | INTRAVENOUS | Status: AC
  Administered 2016-07-03: 500 mL via INTRAVENOUS

## 2016-07-03 NOTE — Progress Notes (Signed)
5 Days Post-Op  Subjective: Tolerating tube feeds No further bleeding UOP down [name] little Has been ambulating  Objective: Vital signs in last 24 hours: Temp:  [98.2 F (36.8 C)-102.3 F (39.1 C)] 98.2 F (36.8 C) (04/07 0433) Pulse Rate:  [99-132] 99 (04/07 0433) Resp:  [17-20] 19 (04/07 0433) BP: (103-135)/(63-85) 135/85 (04/07 0433) SpO2:  [83 %-95 %] 95 % (04/07 0433) Last BM Date: 06/29/16  Intake/Output from previous day: 04/06 0701 - 04/07 0700 In: 738.8 [P.O.:310; I.V.:133.8; IV Piggyback:95] Out: 1940 [Urine:1425; Drains:515] Intake/Output this shift: Total I/O In: -  Out: 200 [Urine:200]  Exam: Looks comfortable Lungs clear Abdomen soft, non-distended  Lab Results:   Recent Labs  07/01/16 0313 07/03/16 0430  WBC 4.7 8.3  HGB 12.7* 10.6*  HCT 37.6* 32.7*  PLT 162 227   BMET  Recent Labs  07/01/16 0313  NA 138  K 4.6  CL 106  CO2 23  GLUCOSE 101*  BUN 18  CREATININE 0.91  CALCIUM 7.9*   PT/INR  Recent Labs  06/30/16 1605  LABPROT 16.1*  INR 1.29   ABG No results for input(s): PHART, HCO3 in the last 72 hours.  Invalid input(s): PCO2, PO2  Studies/Results: No results found.  Anti-infectives: Anti-infectives    Start     Dose/Rate Route Frequency Ordered Stop   06/28/16 0920  cefoTEtan in Dextrose 5% (CEFOTAN) 2-2.08 GM-% IVPB  Status:  Discontinued    Comments:  Mak, Bonny   : cabinet override      06/28/16 0920 06/28/16 0924   06/28/16 0830  cefoTEtan (CEFOTAN) 2 g in dextrose 5 % 50 mL IVPB     2 g 100 mL/hr over 30 Minutes Intravenous To Short Stay 06/28/16 0814 06/28/16 0941   06/28/16 0818  cefoTEtan in Dextrose 5% (CEFOTAN) 2-2.08 GM-% IVPB    Comments:  Precious Haws   : cabinet override      06/28/16 0818 06/28/16 2029      Assessment/Plan: s/p Procedure(s): COLOSTOMY TAKEDOWN (N/[name])  Fever last night.  WBC normal today.  Will monitor for now Bolus IVF Advance tube feeds  LOS: 5 days     Miguel [name] 07/03/2016

## 2016-07-04 ENCOUNTER — Inpatient Hospital Stay (HOSPITAL_COMMUNITY): Payer: 59

## 2016-07-04 ENCOUNTER — Other Ambulatory Visit (HOSPITAL_COMMUNITY): Payer: Medicare Other

## 2016-07-04 LAB — CBC
HCT: 30.2 % — ABNORMAL LOW (ref 39.0–52.0)
HEMOGLOBIN: 10.1 g/dL — AB (ref 13.0–17.0)
MCH: 29.6 pg (ref 26.0–34.0)
MCHC: 33.4 g/dL (ref 30.0–36.0)
MCV: 88.6 fL (ref 78.0–100.0)
PLATELETS: 268 10*3/uL (ref 150–400)
RBC: 3.41 MIL/uL — ABNORMAL LOW (ref 4.22–5.81)
RDW: 14.5 % (ref 11.5–15.5)
WBC: 8.2 10*3/uL (ref 4.0–10.5)

## 2016-07-04 LAB — BASIC METABOLIC PANEL
ANION GAP: 7 (ref 5–15)
BUN: 14 mg/dL (ref 6–20)
CO2: 29 mmol/L (ref 22–32)
Calcium: 7.5 mg/dL — ABNORMAL LOW (ref 8.9–10.3)
Chloride: 100 mmol/L — ABNORMAL LOW (ref 101–111)
Creatinine, Ser: 0.74 mg/dL (ref 0.61–1.24)
GFR calc Af Amer: >60 mL/min (ref >60)
Glucose, Bld: 132 mg/dL — ABNORMAL HIGH (ref 65–99)
POTASSIUM: 3.5 mmol/L (ref 3.5–5.1)
SODIUM: 136 mmol/L (ref 135–145)

## 2016-07-04 NOTE — Progress Notes (Signed)
6 Days Post-Op  Subjective: Has had some fever.  Some leaking around feeding tube Passing flatus but no new BM. Denies abdominal pain  Objective: Vital signs in last 24 hours: Temp:  [98.8 F (37.1 C)-101 F (38.3 C)] 98.8 F (37.1 C) (04/08 0558) Pulse Rate:  [101-120] 101 (04/08 0558) Resp:  [18-20] 18 (04/08 0558) BP: (129-132)/(66-79) 132/79 (04/08 0558) SpO2:  [93 %-95 %] 95 % (04/08 0558) Last BM Date: 06/29/16  Intake/Output from previous day: 04/07 0701 - 04/08 0700 In: 1212 [P.O.:240; NG/GT:972] Out: 1610 [Urine:1500; Drains:110] Intake/Output this shift: Total I/O In: -  Out: 125 [Urine:125]  Exam: Mild decrease in lung bases bilaterally Abdomen soft, drains serosang No leak at G-tube this morning  Lab Results:   Recent Labs  07/03/16 0430 07/04/16 0313  WBC 8.3 8.2  HGB 10.6* 10.1*  HCT 32.7* 30.2*  PLT 227 268   BMET  Recent Labs  07/04/16 0313  NA 136  K 3.5  CL 100*  CO2 29  GLUCOSE 132*  BUN 14  CREATININE 0.74  CALCIUM 7.5*   PT/INR No results for input(s): LABPROT, INR in the last 72 hours. ABG No results for input(s): PHART, HCO3 in the last 72 hours.  Invalid input(s): PCO2, PO2  Studies/Results: No results found.  Anti-infectives: Anti-infectives    Start     Dose/Rate Route Frequency Ordered Stop   06/28/16 0920  cefoTEtan in Dextrose 5% (CEFOTAN) 2-2.08 GM-% IVPB  Status:  Discontinued    Comments:  Aniketh, Huberty   : cabinet override      06/28/16 0920 06/28/16 0924   06/28/16 0830  cefoTEtan (CEFOTAN) 2 g in dextrose 5 % 50 mL IVPB     2 g 100 mL/hr over 30 Minutes Intravenous To Short Stay 06/28/16 0814 06/28/16 0941   06/28/16 0818  cefoTEtan in Dextrose 5% (CEFOTAN) 2-2.08 GM-% IVPB    Comments:  Precious Haws   : cabinet override      06/28/16 0818 06/28/16 2029      Assessment/Plan: s/p Procedure(s): COLOSTOMY TAKEDOWN (N/A)  Fever.  WBC remains normal.  Will check a XRAY Continue tube feeds.  May  need to change tube   LOS: 6 days    Mary-Ann Pennella A 07/04/2016

## 2016-07-05 ENCOUNTER — Inpatient Hospital Stay (HOSPITAL_COMMUNITY): Payer: 59

## 2016-07-05 ENCOUNTER — Encounter (HOSPITAL_COMMUNITY): Payer: Self-pay | Admitting: Radiology

## 2016-07-05 MED ORDER — PIPERACILLIN-TAZOBACTAM 3.375 G IVPB
3.3750 g | Freq: Three times a day (TID) | INTRAVENOUS | Status: DC
  Administered 2016-07-06 – 2016-07-13 (×23): 3.375 g via INTRAVENOUS
  Filled 2016-07-05 (×24): qty 50

## 2016-07-05 MED ORDER — IOPAMIDOL (ISOVUE-300) INJECTION 61%
15.0000 mL | INTRAVENOUS | Status: AC
  Administered 2016-07-05 (×2): 15 mL via ORAL

## 2016-07-05 MED ORDER — PIPERACILLIN-TAZOBACTAM 3.375 G IVPB 30 MIN
3.3750 g | Freq: Once | INTRAVENOUS | Status: AC
  Administered 2016-07-05: 3.375 g via INTRAVENOUS
  Filled 2016-07-05: qty 50

## 2016-07-05 MED ORDER — AMOXICILLIN-POT CLAVULANATE 875-125 MG PO TABS
1.0000 | ORAL_TABLET | Freq: Two times a day (BID) | ORAL | Status: DC
  Administered 2016-07-05: 1 via ORAL
  Filled 2016-07-05: qty 1

## 2016-07-05 MED ORDER — IOPAMIDOL (ISOVUE-300) INJECTION 61%
INTRAVENOUS | Status: AC
  Administered 2016-07-05: 100 mL
  Filled 2016-07-05: qty 100

## 2016-07-05 NOTE — Progress Notes (Signed)
Enteral feeding stopped as ordered by MD

## 2016-07-05 NOTE — Progress Notes (Signed)
Flushed contrast through peg tube then flushed with 55mL sterile water. Pt. Tolerated well. PEG tube clamped until after CT.

## 2016-07-05 NOTE — Progress Notes (Signed)
MD txt paged regarding feeding orders after pt returned from CT scan. No return call received. Called the [place] office and spoke to his nurse who said she will attempt to page him so he can call reporting nurse back

## 2016-07-05 NOTE — Progress Notes (Signed)
MD gave verbal order to resume all feedings and diet after CT scan was done. Soft diet reordered and PEG feeding resumed

## 2016-07-05 NOTE — Progress Notes (Signed)
7 Days Post-Op  Subjective: Still having fever. No nausea Tolerating tube feed  Objective: Vital signs in last 24 hours: Temp:  [98.8 F (37.1 C)-101.2 F (38.4 C)] 98.8 F (37.1 C) (04/09 0625) Pulse Rate:  [111-115] 111 (04/09 0409) Resp:  [17-18] 17 (04/09 0409) BP: (133-149)/(75-83) 144/83 (04/09 0409) SpO2:  [90 %-94 %] 91 % (04/09 0409) Last BM Date: 06/29/16  Intake/Output from previous day: 04/08 0701 - 04/09 0700 In: 1228.3 [P.O.:596; NG/GT:632.3] Out: 1350 [Urine:1250; Drains:100] Intake/Output this shift: Total I/O In: -  Out: 200 [Urine:200]  Exam: Lungs with decrease at the bases Abdomen soft, incision clean, drains serosang  Lab Results:   Recent Labs  07/03/16 0430 07/04/16 0313  WBC 8.3 8.2  HGB 10.6* 10.1*  HCT 32.7* 30.2*  PLT 227 268   BMET  Recent Labs  07/04/16 0313  NA 136  K 3.5  CL 100*  CO2 29  GLUCOSE 132*  BUN 14  CREATININE 0.74  CALCIUM 7.5*   PT/INR No results for input(s): LABPROT, INR in the last 72 hours. ABG No results for input(s): PHART, HCO3 in the last 72 hours.  Invalid input(s): PCO2, PO2  Studies/Results: Dg Chest Port 1 View  Result Date: 07/04/2016 CLINICAL DATA:  Fever. History of hypertension, hiatal hernia repair. EXAM: PORTABLE CHEST 1 VIEW COMPARISON:  Chest x-ray dated 12/29/2015. FINDINGS: Mediastinum appears widened, possibly accentuated by the lordotic and semi upright patient positioning. Heart size appears grossly stable. Hazy opacities at each lung base, likely atelectasis and/or small pleural effusions. Upper lungs are relatively clear. No pneumothorax seen. No acute or suspicious osseous finding. IMPRESSION: 1. Mediastinum appears widened, likely accentuated to some degree by the lordotic and semi upright patient positioning. Mediastinal mass or acute mediastinal abnormality cannot be excluded. Consider repeat chest x-ray with patient in a more upright position if possible. If any chest pain,  would consider chest CT for definitive characterization. 2. Hazy opacities at each lung base, likely atelectasis and/or small pleural effusions. Electronically Signed   By: Franki Cabot M.D.   On: 07/04/2016 13:10    Anti-infectives: Anti-infectives    Start     Dose/Rate Route Frequency Ordered Stop   06/28/16 0920  cefoTEtan in Dextrose 5% (CEFOTAN) 2-2.08 GM-% IVPB  Status:  Discontinued    Comments:  Loney, Domingo   : cabinet override      06/28/16 0920 06/28/16 0924   06/28/16 0830  cefoTEtan (CEFOTAN) 2 g in dextrose 5 % 50 mL IVPB     2 g 100 mL/hr over 30 Minutes Intravenous To Short Stay 06/28/16 0814 06/28/16 0941   06/28/16 0818  cefoTEtan in Dextrose 5% (CEFOTAN) 2-2.08 GM-% IVPB    Comments:  Precious Haws   : cabinet override      06/28/16 0818 06/28/16 2029      Assessment/Plan: s/p Procedure(s): COLOSTOMY TAKEDOWN (N/A)  Post op fever  Will CT abd/pelvis to evaluate for an intra-abdominal abscess. Start oral antibiotics  LOS: 7 days    Miguel [name] A 07/05/2016

## 2016-07-05 NOTE — Care Management Note (Addendum)
Case Management Note  Patient Details  Name: Miguel [name] MRN: [id number] Date of Birth: 1951-01-15  Subjective/Objective:                    Action/Plan:  Tube feeds through [facility] . If tube feeding is the same as prior to admission, patient does not need new tube feeding orders. Expected Discharge Date:                  Expected Discharge Plan:  Home/Self Care  In-House Referral:     Discharge planning Services  CM Consult  Post Acute Care Choice:    Choice offered to:     DME Arranged:    DME Agency:     HH Arranged:    HH Agency:     Status of Service:  In process, will continue to follow  If discussed at Long Length of Stay Meetings, dates discussed:    Additional Comments:  Marilu Favre, RN 07/05/2016, 2:58 PM

## 2016-07-05 NOTE — Progress Notes (Addendum)
Pt. Requesting to take contrast through peg tube. Per radiology, it is okay to give contrast through PEG.

## 2016-07-06 ENCOUNTER — Ambulatory Visit (INDEPENDENT_AMBULATORY_CARE_PROVIDER_SITE_OTHER): Payer: 59 | Admitting: Orthopaedic Surgery

## 2016-07-06 ENCOUNTER — Inpatient Hospital Stay (HOSPITAL_COMMUNITY): Payer: 59

## 2016-07-06 HISTORY — PX: IR GUIDED DRAIN W CATHETER PLACEMENT: IMG719

## 2016-07-06 LAB — PROTIME-INR
INR: 1.15
Prothrombin Time: 14.7 seconds (ref 11.4–15.2)

## 2016-07-06 MED ORDER — IOPAMIDOL (ISOVUE-300) INJECTION 61%
INTRAVENOUS | Status: AC
  Filled 2016-07-06: qty 30

## 2016-07-06 MED ORDER — MIDAZOLAM HCL 2 MG/2ML IJ SOLN
INTRAMUSCULAR | Status: AC | PRN
Start: 2016-07-06 — End: 2016-07-06
  Administered 2016-07-06 (×2): 1 mg via INTRAVENOUS

## 2016-07-06 MED ORDER — KCL IN DEXTROSE-NACL 20-5-0.9 MEQ/L-%-% IV SOLN
INTRAVENOUS | Status: DC
  Administered 2016-07-06 – 2016-07-07 (×4): via INTRAVENOUS
  Administered 2016-07-08: 50 mL/h via INTRAVENOUS
  Filled 2016-07-06 (×7): qty 1000

## 2016-07-06 MED ORDER — LIDOCAINE HCL 1 % IJ SOLN
INTRAMUSCULAR | Status: AC
  Filled 2016-07-06: qty 40

## 2016-07-06 MED ORDER — FENTANYL CITRATE (PF) 100 MCG/2ML IJ SOLN
INTRAMUSCULAR | Status: AC | PRN
  Administered 2016-07-06: 50 ug via INTRAVENOUS
  Administered 2016-07-06 (×2): 25 ug via INTRAVENOUS

## 2016-07-06 MED ORDER — FENTANYL CITRATE (PF) 100 MCG/2ML IJ SOLN
INTRAMUSCULAR | Status: AC
  Filled 2016-07-06: qty 4

## 2016-07-06 MED ORDER — MIDAZOLAM HCL 2 MG/2ML IJ SOLN
INTRAMUSCULAR | Status: AC
  Filled 2016-07-06: qty 4

## 2016-07-06 NOTE — Progress Notes (Signed)
8 Days Post-Op  Subjective: Patient reports mild to moderate incisional pain. Nausea resolved Having BM's  Objective: Vital signs in last 24 hours: Temp:  [99.8 F (37.7 C)-101.9 F (38.8 C)] 99.8 F (37.7 C) (04/09 2007) Pulse Rate:  [104-110] 104 (04/09 2007) Resp:  [18-19] 19 (04/09 2007) BP: (140-145)/(75-76) 145/75 (04/09 2007) SpO2:  [90 %-94 %] 94 % (04/09 2007) Last BM Date: 07/05/16  Intake/Output from previous day: 04/09 0701 - 04/10 0700 In: 130 [IV Piggyback:100] Out: 1610 [Urine:1500; Drains:110] Intake/Output this shift: No intake/output data recorded.  Exam: Appears fairly comfortable Lungs with decrease at bases Abdomen soft, mild to moderate incisional tenderness but no frank peritonitis Drains serous (I removed them)  Lab Results:   Recent Labs  07/04/16 0313  WBC 8.2  HGB 10.1*  HCT 30.2*  PLT 268   BMET  Recent Labs  07/04/16 0313  NA 136  K 3.5  CL 100*  CO2 29  GLUCOSE 132*  BUN 14  CREATININE 0.74  CALCIUM 7.5*   PT/INR No results for input(s): LABPROT, INR in the last 72 hours. ABG No results for input(s): PHART, HCO3 in the last 72 hours.  Invalid input(s): PCO2, PO2  Studies/Results: Ct Abdomen Pelvis W Contrast  Result Date: 07/05/2016 CLINICAL DATA:  History of recent colostomy reversal with fever and pain, initial encounter EXAM: CT ABDOMEN AND PELVIS WITH CONTRAST TECHNIQUE: Multidetector CT imaging of the abdomen and pelvis was performed using the standard protocol following bolus administration of intravenous contrast. CONTRAST:  131mL ISOVUE-300 IOPAMIDOL (ISOVUE-300) INJECTION 61% COMPARISON:  12/25/2015 FINDINGS: Lower chest: Bilateral lower lobe consolidation is noted right slightly greater than left. Small associated pleural effusions are noted bilaterally. Hepatobiliary: The liver itself is within normal limits as is the gallbladder. No biliary ductal dilatation is seen. A small amount of free air is noted  surrounding the liver consistent with the recent surgery. Pancreas: Unremarkable. No pancreatic ductal dilatation or surrounding inflammatory changes. Spleen: The spleen itself is within normal limits. Adjacent to the spleen and extending along the rope left pericolic gutter there is a large crests enteric air-fluid collection which measures approximately 17 cm in greatest AP dimension and approximately 4.9 cm in greatest transverse dimension. It has a somewhat multiloculated appearance superiorly along the posterior margin of the spleen. Adrenals/Urinary Tract: The adrenal glands are within normal limits. The kidneys again demonstrate a right renal cyst stable from the prior exam. Tiny nonobstructing calculi are again seen bilaterally. The bladder is partially distended without focal abnormality. Stomach/Bowel: There is been interval take down of the patient's left lower quadrant ostomy and reduction of the parastomal hernia. 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 The previously seen duodenostomy tube has been removed. Postsurgical changes consistent with a reanastomosis are noted in the colon. Adjacent to these postsurgical changes there is some air identified best noted on image number 69 of series 3. It would be difficult to exclude a breakdown of the anastomosis in this region. The stomach is distended with contrast material in there is contrast within the distal esophagus as well this likely represents reflux. A gastrostomy catheter is noted anteriorly within the gastric lumen. Vascular/Lymphatic: Aortic atherosclerosis. No enlarged abdominal or pelvic lymph nodes. Reproductive: Prostate is unremarkable. Other: The air-fluid level in the left abdomen described above. Additionally there are at least 2 other fluid collections 1 in the mid abdomen best seen on image number 61 of series 3 measuring approximately 9.5 by 3.6 cm in greatest transverse and  AP dimensions. A third fluid collection is  noted deep within the pelvis measuring approximately 11.2 cm by 5.8 cm best seen on image number 78 of series 3. This also appears to have a small continuation with a larger collection measuring approximately 15 cm in greatest transverse dimension in the anterior aspect of the abdomen best seen on image number 73 of series 3. These do not appear to be related to any loops of bowel and are consistent with postoperative air-fluid collections an likely developing abscesses. A fourth air-fluid collection is noted just beneath the anterior abdominal wall best seen on image number 47 of series 3 in a somewhat bilobed fashion. This may simply represent free air and free fluid as opposed to a developing collection. Surgical drains are noted in the subcutaneous fat of the anterior abdominal wall consistent with the given clinical history. Musculoskeletal: No acute bony abnormality is noted. IMPRESSION: Bilateral lower lobe consolidation and associated effusions. Postsurgical changes within the colon consistent with the recent colostomy takedown as well as postsurgical changes related to the hernia repair. There are multiple air-fluid collections as described throughout the abdomen suspicious for postoperative abscesses. Additionally some free air is noted as well as possible fluid loculated anteriorly beneath the anterior abdominal wall. Stable nonobstructing bilateral renal calculi. The previously seen bladder calculi are appreciated within small bladder diverticulum although not mentioned in the body of the report. These results will be called to the ordering clinician or representative by the Radiologist Assistant, and communication documented in the PACS or zVision Dashboard. Electronically Signed   By: Inez Catalina M.D.   On: 07/05/2016 14:24   Dg Chest Port 1 View  Result Date: 07/04/2016 CLINICAL DATA:  Fever. History of hypertension, hiatal hernia repair. EXAM: PORTABLE CHEST 1 VIEW COMPARISON:  Chest x-ray dated  12/29/2015. FINDINGS: Mediastinum appears widened, possibly accentuated by the lordotic and semi upright patient positioning. Heart size appears grossly stable. Hazy opacities at each lung base, likely atelectasis and/or small pleural effusions. Upper lungs are relatively clear. No pneumothorax seen. No acute or suspicious osseous finding. IMPRESSION: 1. Mediastinum appears widened, likely accentuated to some degree by the lordotic and semi upright patient positioning. Mediastinal mass or acute mediastinal abnormality cannot be excluded. Consider repeat chest x-ray with patient in a more upright position if possible. If any chest pain, would consider chest CT for definitive characterization. 2. Hazy opacities at each lung base, likely atelectasis and/or small pleural effusions. Electronically Signed   By: Franki Cabot M.D.   On: 07/04/2016 13:10    Anti-infectives: Anti-infectives    Start     Dose/Rate Route Frequency Ordered Stop   07/06/16 0100  piperacillin-tazobactam (ZOSYN) IVPB 3.375 g     3.375 g 12.5 mL/hr over 240 Minutes Intravenous Every 8 hours 07/05/16 1833     07/05/16 1900  piperacillin-tazobactam (ZOSYN) IVPB 3.375 g     3.375 g 100 mL/hr over 30 Minutes Intravenous  Once 07/05/16 1835 07/05/16 2049   07/05/16 1000  amoxicillin-clavulanate (AUGMENTIN) 875-125 MG per tablet 1 tablet  Status:  Discontinued     1 tablet Oral Every 12 hours 07/05/16 0937 07/05/16 1833   06/28/16 0920  cefoTEtan in Dextrose 5% (CEFOTAN) 2-2.08 GM-% IVPB  Status:  Discontinued    Comments:  Elige Ko   : cabinet override      06/28/16 0920 06/28/16 0924   06/28/16 0830  cefoTEtan (CEFOTAN) 2 g in dextrose 5 % 50 mL IVPB     2  g 100 mL/hr over 30 Minutes Intravenous To Short Stay 06/28/16 0814 06/28/16 0941   06/28/16 0818  cefoTEtan in Dextrose 5% (CEFOTAN) 2-2.08 GM-% IVPB    Comments:  Precious Haws   : cabinet override      06/28/16 0818 06/28/16 2029      Assessment/Plan: s/p  Procedure(s): COLOSTOMY TAKEDOWN (N/A)  Ct shows multiple fluid collections. I am worried that the G-tube was dislodged (placed in August) and he had tube feeds going intra-abdominally.  His WBC is normal and he is still having BM's even this morning but this may still be an anastomotic issue.  Discussed with IR.  Will have drains placed and G-tube evaluated and possible exchanged.  He still may require a laparotomy  I discussed this with the patient and his wife and they agree to proceed as planned.  LOS: 8 days    Yoland Scherr A 07/06/2016

## 2016-07-06 NOTE — Sedation Documentation (Signed)
Patient is resting comfortably. 

## 2016-07-06 NOTE — Progress Notes (Signed)
Patient returned from IR, two JP drains noted to left abdomen with minimal drainage in each bulb, PEG tube noted to same position and clamped, IV fluids running, patient resting comfortably. Will continue to monitor. Family at bedside.

## 2016-07-06 NOTE — Consult Note (Signed)
Chief Complaint: Patient was seen in consultation today for intra abdominal abscess drain(s) placement at the request of Dr Evlyn Courier  Referring Physician(s): Dr Evlyn Courier  Supervising Physician: Jacqulynn Cadet  Patient Status: [facility] - In-pt  History of Present Illness: Miguel [name] is a 66 y.o. male   Hx perforated sigmoid colon Hartmanns procedure a yr ago Has since had perforated gastric ulcer surgery and Gastric tube placement Previous hernia repair Colostomy takedown 8 days ago New abd pain and fever CT 4/9: IMPRESSION: Bilateral lower lobe consolidation and associated effusions. Postsurgical changes within the colon consistent with the recent colostomy takedown as well as postsurgical changes related to the hernia repair. There are multiple air-fluid collections as described throughout the abdomen suspicious for postoperative abscesses. Additionally some free air is noted as well as possible fluid loculated anteriorly beneath the anterior abdominal wall. Stable nonobstructing bilateral renal calculi. The previously seen bladder calculi are appreciated within small bladder diverticulum although not mentioned in the body of the report  Request for intra abdominal abscess drain (s) placement Evaluation of Gastric tube: injection and possible exchange---per Dr Ninfa Linden Imaging reviewed with Dr Laurence Ferrari and approves procedure   Past Medical History:  Diagnosis Date  . Anemia   . Anxiety   . BPH (benign prostatic hyperplasia)   . Gastritis   . GI bleed due to NSAIDs 10/27/2015  . Hyperlipidemia   . Hypertension   . Kidney stones   . 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 Sigmoid diverticulitis    with perforation    Past Surgical History:  Procedure Laterality Date  . COLON SURGERY     sigmoid  . COLOSTOMY TAKEDOWN N/A 06/28/2016   Procedure: COLOSTOMY TAKEDOWN;  Surgeon: [name], MD;  Location: [place];  Service: General;  Laterality:  N/A;  . ESOPHAGOGASTRODUODENOSCOPY N/A 10/27/2015   Procedure: ESOPHAGOGASTRODUODENOSCOPY (EGD);  Surgeon: Clarene Essex, MD;  Location: [facility] ENDOSCOPY;  Service: Endoscopy;  Laterality: N/A;  . ESOPHAGOGASTRODUODENOSCOPY N/A 10/29/2015   Procedure: ESOPHAGOGASTRODUODENOSCOPY (EGD);  Surgeon: Ronald Lobo, MD;  Location: [facility] ENDOSCOPY;  Service: Endoscopy;  Laterality: N/A;  . ESOPHAGOGASTRODUODENOSCOPY N/A 10/30/2015   Procedure: ESOPHAGOGASTRODUODENOSCOPY (EGD);  Surgeon: Clarene Essex, MD;  Location: [facility] ENDOSCOPY;  Service: Endoscopy;  Laterality: N/A;  . GASTROSTOMY TUBE PLACEMENT  11/21/2015   REDUCTION OF HIATAL HERNIA , REPAIR HIATAL HERNIA, RESECTION SMALL BOWEL WITH ANASTOMOSIS, PLACEMENT GASTROSTOMY TUBE, PLACEMENT DUODENOSTOMY TUBE (N/A)  . HEMORRHOID BANDING  X 2  . HIATAL HERNIA REPAIR N/A 11/21/2015   Procedure: REDUCTION OF HIATAL HERNIA , REPAIR HIATAL HERNIA, RESECTION SMALL BOWEL WITH ANASTOMOSIS, PLACEMENT GASTROSTOMY TUBE, PLACEMENT DUODENOSTOMY TUBE;  Surgeon: Mickeal Skinner, MD;  Location: [place];  Service: General;  Laterality: N/A;  . [place]   "opened me up"  . LAPAROTOMY N/A 07/05/2015   Procedure: PARTIAL SIGMOID COLECTOMY AND COLOSTOMY;  Surgeon: [name], MD;  Location: [place];  Service: General;  Laterality: N/A;  . MELANOMA EXCISION  2001  . 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 REPAIR OF PERFORATED ULCER N/A 10/30/2015   Procedure: REPAIR OF BLEEDING  ULCER;  Surgeon: Judeth Horn, MD;  Location: [place];  Service: General;  Laterality: N/A;  . TUMOR EXCISION  2009   "back; fatty tumor"    Allergies: No known allergies  Medications: Prior to Admission medications   Medication Sig Start Date End Date Taking? Authorizing Provider  acetaminophen (TYLENOL) 500 MG tablet Take 1 tablet (500 mg total) by mouth  every 8 (eight) hours as needed for mild pain (for pain). 12/05/15  Yes Darci Current Simaan, PA-C  aspirin EC 81 MG tablet Take 81 mg by mouth daily after supper.    Yes Historical Provider, MD  famotidine (PEPCID) 20 MG tablet Take 1 tablet (20 mg total) by mouth 2 (two) times daily. 11/09/15  Yes Elwin Mocha, MD  ferrous sulfate 325 (65 FE) MG tablet Take 325 mg by mouth daily.   Yes Historical Provider, MD  Multiple Vitamin (MULTIVITAMIN WITH MINERALS) TABS tablet Take 1 tablet by mouth daily.   Yes Historical Provider, MD  Nutritional Supplements (FEEDING SUPPLEMENT, OSMOLITE 1.5 CAL,) LIQD Place 1,000 mLs into feeding tube continuous.   Yes Historical Provider, MD  Omega-3 Fatty Acids (FISH OIL) 1000 MG CPDR Take 1,000 mg by mouth daily.   Yes Historical Provider, MD  Probiotic Product (ALIGN) 4 MG CAPS Take 1 capsule by mouth daily.   Yes Historical Provider, MD  tamsulosin (FLOMAX) 0.4 MG CAPS capsule Take 1 capsule (0.4 mg total) by mouth daily. 01/04/16  Yes Fanny Skates, MD  traMADol (ULTRAM) 50 MG tablet Take 50 mg by mouth every 6 (six) hours as needed for moderate pain.  05/21/16  Yes Historical Provider, MD  zolpidem (AMBIEN) 10 MG tablet Take 5-10 mg by mouth at bedtime as needed and may repeat dose one time if needed for sleep.    Yes Historical Provider, MD  diclofenac sodium (VOLTAREN) 1 % GEL Apply 1 application topically 2 (two) times daily as needed (back and shoulder pain).    Historical Provider, MD     Family History  Problem Relation Age of Onset  . Stroke Mother   . Stroke Brother   . Heart disease Brother     Social History   Social History  . Marital status: Married    Spouse name: N/A  . Number of children: N/A  . Years of education: N/A   Occupational History  . unable to work since April    Social History Main Topics  . Smoking status: Never Smoker  . Smokeless tobacco: Former Systems developer    Types: Snuff    Quit date: 07/14/2015  . Alcohol use No  . Drug  use: No  . Sexual activity: Yes   Other Topics Concern  . None   Social History Narrative  . None    Review of Systems: A 12 point ROS discussed and pertinent positives are indicated in the HPI above.  All other systems are negative.  Review of Systems  Constitutional: Positive for activity change, appetite change, fatigue and fever.  Respiratory: Negative for shortness of breath.   Cardiovascular: Negative for chest pain.  Gastrointestinal: Positive for abdominal pain and nausea.  Neurological: Positive for weakness.  Psychiatric/Behavioral: Negative for behavioral problems and confusion.    Vital Signs: BP 128/77 (BP Location: Left Arm)   Pulse (!) 103   Temp 98.4 F (36.9 C) (Oral)   Resp 19   Ht 5\' 6"  (1.676 m)   Wt 138 lb 14.2 oz (63 kg)   SpO2 97%   BMI 22.42 kg/m   Physical Exam  Constitutional: He is oriented to person, place, and time.  Cardiovascular: Normal rate and regular rhythm.   Pulmonary/Chest: Effort normal and breath sounds normal.  Abdominal: Soft.  There is tenderness.  Musculoskeletal: Normal range of motion.  Neurological: He is alert and oriented to person, place, and time.  Skin: Skin is warm and dry.  Psychiatric: He has a normal mood and affect. His behavior is normal. Judgment and thought content normal.  Nursing note and vitals reviewed.   Mallampati Score:  MD Evaluation Airway: WNL Heart: WNL Abdomen: WNL Chest/ Lungs: WNL ASA  Classification: 3 Mallampati/Airway Score: One  Imaging: Ct Abdomen Pelvis W Contrast  Result Date: 07/05/2016 CLINICAL DATA:  History of recent colostomy reversal with fever and pain, initial encounter EXAM: CT ABDOMEN AND PELVIS WITH CONTRAST TECHNIQUE: Multidetector CT imaging of the abdomen and pelvis was performed using the standard protocol following bolus administration of intravenous contrast. CONTRAST:  125mL ISOVUE-300 IOPAMIDOL (ISOVUE-300) INJECTION 61% COMPARISON:  12/25/2015 FINDINGS: Lower  chest: Bilateral lower lobe consolidation is noted right slightly greater than left. Small associated pleural effusions are noted bilaterally. Hepatobiliary: The liver itself is within normal limits as is the gallbladder. No biliary ductal dilatation is seen. A small amount of free air is noted surrounding the liver consistent with the recent surgery. Pancreas: Unremarkable. No pancreatic ductal dilatation or surrounding inflammatory changes. Spleen: The spleen itself is within normal limits. Adjacent to the spleen and extending along the rope left pericolic gutter there is a large crests enteric air-fluid collection which measures approximately 17 cm in greatest AP dimension and approximately 4.9 cm in greatest transverse dimension. It has a somewhat multiloculated appearance superiorly along the posterior margin of the spleen. Adrenals/Urinary Tract: The adrenal glands are within normal limits. The kidneys again demonstrate a right renal cyst stable from the prior exam. Tiny nonobstructing calculi are again seen bilaterally. The bladder is partially distended without focal abnormality. Stomach/Bowel: There is been interval take down of the patient's left lower quadrant ostomy and reduction of the parastomal hernia. 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 The previously seen duodenostomy tube has been removed. Postsurgical changes consistent with a reanastomosis are noted in the colon. Adjacent to these postsurgical changes there is some air identified best noted on image number 69 of series 3. It would be difficult to exclude a breakdown of the anastomosis in this region. The stomach is distended with contrast material in there is contrast within the distal esophagus as well this likely represents reflux. A gastrostomy catheter is noted anteriorly within the gastric lumen. Vascular/Lymphatic: Aortic atherosclerosis. No enlarged abdominal or pelvic lymph nodes. Reproductive: Prostate is unremarkable.  Other: The air-fluid level in the left abdomen described above. Additionally there are at least 2 other fluid collections 1 in the mid abdomen best seen on image number 61 of series 3 measuring approximately 9.5 by 3.6 cm in greatest transverse and AP dimensions. A third fluid collection is noted deep within the pelvis measuring approximately 11.2 cm by 5.8 cm best seen on image number 78 of series 3. This also appears to have a small continuation with a larger collection measuring approximately 15 cm in greatest transverse dimension in the anterior aspect of the abdomen best seen on image number 73 of series 3. These do not appear to be related to any loops of bowel and are consistent with postoperative air-fluid collections an likely developing abscesses. A fourth air-fluid collection is noted just beneath the anterior abdominal wall best seen on image number 47 of series 3 in a somewhat bilobed fashion. This may simply represent free air and free fluid as opposed to a developing collection. Surgical drains are  noted in the subcutaneous fat of the anterior abdominal wall consistent with the given clinical history. Musculoskeletal: No acute bony abnormality is noted. IMPRESSION: Bilateral lower lobe consolidation and associated effusions. Postsurgical changes within the colon consistent with the recent colostomy takedown as well as postsurgical changes related to the hernia repair. There are multiple air-fluid collections as described throughout the abdomen suspicious for postoperative abscesses. Additionally some free air is noted as well as possible fluid loculated anteriorly beneath the anterior abdominal wall. Stable nonobstructing bilateral renal calculi. The previously seen bladder calculi are appreciated within small bladder diverticulum although not mentioned in the body of the report. These results will be called to the ordering clinician or representative by the Radiologist Assistant, and communication  documented in the PACS or zVision Dashboard. Electronically Signed   By: Inez Catalina M.D.   On: 07/05/2016 14:24   Dg Chest Port 1 View  Result Date: 07/04/2016 CLINICAL DATA:  Fever. History of hypertension, hiatal hernia repair. EXAM: PORTABLE CHEST 1 VIEW COMPARISON:  Chest x-ray dated 12/29/2015. FINDINGS: Mediastinum appears widened, possibly accentuated by the lordotic and semi upright patient positioning. Heart size appears grossly stable. Hazy opacities at each lung base, likely atelectasis and/or small pleural effusions. Upper lungs are relatively clear. No pneumothorax seen. No acute or suspicious osseous finding. IMPRESSION: 1. Mediastinum appears widened, likely accentuated to some degree by the lordotic and semi upright patient positioning. Mediastinal mass or acute mediastinal abnormality cannot be excluded. Consider repeat chest x-ray with patient in a more upright position if possible. If any chest pain, would consider chest CT for definitive characterization. 2. Hazy opacities at each lung base, likely atelectasis and/or small pleural effusions. Electronically Signed   By: Franki Cabot M.D.   On: 07/04/2016 13:10   Xr Thoracic Spine 2 View  Result Date: 06/09/2016 2 views of his thoracic spine show no acute findings. He has some mild degenerative changes throughout the thoracic spine but no evidence of Compressive deformity   Labs:  CBC:  Recent Labs  06/30/16 0621 07/01/16 0313 07/03/16 0430 07/04/16 0313  WBC 8.1 4.7 8.3 8.2  HGB 8.1* 12.7* 10.6* 10.1*  HCT 24.4* 37.6* 32.7* 30.2*  PLT 166 162 227 268    COAGS:  Recent Labs  10/29/15 1851 10/30/15 1245 10/30/15 1636 11/06/15 0453 06/30/16 1605  INR 1.28 1.42 1.34 1.38 1.29  APTT 29 27 25   --  32    BMP:  Recent Labs  06/21/16 1028 06/29/16 1143 07/01/16 0313 07/04/16 0313  NA 136 139 138 136  K 4.6 4.5 4.6 3.5  CL 99* 108 106 100*  CO2 29 24 23 29   GLUCOSE 107* 141* 101* 132*  BUN 16 22* 18 14    CALCIUM 9.2 8.0* 7.9* 7.5*  CREATININE 0.92 1.77* 0.91 0.74  GFRNONAA >60 38* >60 >60  GFRAA >60 44* >60 >60    LIVER FUNCTION TESTS:  Recent Labs  12/13/15 0454 12/22/15 1234 12/25/15 1736 12/26/15 0636  BILITOT 0.5 0.6 0.5 0.8  AST 22 21 26 17   ALT 39 21 21 16*  ALKPHOS 250* 105 108 87  PROT 5.9* 5.8* 6.6 5.4*  ALBUMIN 2.3* 2.2* 2.6* 2.1*    TUMOR MARKERS: No results for input(s): AFPTM, CEA, CA199, CHROMGRNA in the last 8760 hours.  Assessment and Plan:  Hartmanns procedure a yr ago Colostomy takedown 8 days ago New abd pain; fever + intra abdominal fluid collections Now scheduled for abscess drain (s) placement --along with Gastric tube  evaluation to include injection and possible exchange Risks and Benefits discussed with the patient including bleeding, infection, damage to adjacent structures, bowel perforation/fistula connection, and sepsis. All of the patient's questions were answered, patient is agreeable to proceed. Consent signed and in chart.   Thank you for this interesting consult.  I greatly enjoyed meeting Miguel [name] and look forward to participating in their care.  A copy of this report was sent to the requesting provider on this date.  Electronically Signed: Jalan Bodi A 07/06/2016, 10:03 AM   I spent a total of 40 Minutes    in face to face in clinical consultation, greater than 50% of which was counseling/coordinating care for abdominal abscess drain(s)

## 2016-07-06 NOTE — Sedation Documentation (Signed)
From Jp drain 1 has bloody drainage from it. From #2 drain more purulent drainage from it.

## 2016-07-06 NOTE — Procedures (Signed)
Interventional Radiology Procedure Note  Procedure: CT guided drainage of abdominal abscesses x 2; advancement and injection of gastrostomy tube  Complications: None  Estimated Blood Loss: < 10 mL  Findings:  Gastrostomy tube further migrated with tip just barely beyond abdominal wall on initial CT.  Balloon deflated and tube able to be successfully advanced back into gastric lumen.  Retention balloon injected with 8 mL of saline.  Contrast injection confirms intraluminal positioning of tube in stomach.  Left upper abdominal abscess yielded thin, bloody fluid.  10 Fr drain placed and attached to suction bulb.  Sample sent for culture.  Lower abdominal abscess yielded thicker, creamy, bloody fluid.  12 Fr drain placed and attached to suction bulb.  Sample sent for culture.  Will follow drain output.  Venetia Night. Kathlene Cote, M.D Pager:  [phone]

## 2016-07-07 ENCOUNTER — Inpatient Hospital Stay (HOSPITAL_COMMUNITY): Payer: 59

## 2016-07-07 LAB — GLUCOSE, CAPILLARY
GLUCOSE-CAPILLARY: 180 mg/dL — AB (ref 65–99)
Glucose-Capillary: 159 mg/dL — ABNORMAL HIGH (ref 65–99)

## 2016-07-07 MED ORDER — JEVITY 1.2 CAL PO LIQD: 1000.0000 mL | ORAL | Status: DC

## 2016-07-07 MED ORDER — OSMOLITE 1.5 CAL PO LIQD
1000.0000 mL | ORAL | Status: DC
  Filled 2016-07-07 (×99): qty 1000

## 2016-07-07 MED ORDER — FREE WATER
75.0000 mL | Freq: Four times a day (QID) | Status: DC
  Administered 2016-07-07 – 2016-07-13 (×24): 75 mL

## 2016-07-07 MED ORDER — OSMOLITE 1.5 CAL PO LIQD
1000.0000 mL | ORAL | Status: DC
  Administered 2016-07-07 – 2016-07-13 (×7): 1000 mL
  Filled 2016-07-07 (×10): qty 1000

## 2016-07-07 NOTE — Progress Notes (Signed)
9 Days Post-Op  Subjective: Feeling better today after CT drains and G tube adjustment Had several BM's  Objective: Vital signs in last 24 hours: Temp:  [99.9 F (37.7 C)-101.8 F (38.8 C)] 99.9 F (37.7 C) (04/10 2130) Pulse Rate:  [99-106] 104 (04/10 1959) Resp:  [18-20] 19 (04/10 1959) BP: (118-146)/(75-90) 139/88 (04/10 1959) SpO2:  [97 %-100 %] 99 % (04/10 1959) Last BM Date: 07/05/16  Intake/Output from previous day: 04/10 0701 - 04/11 0700 In: 1970 [I.V.:1875; IV Piggyback:75] Out: 625 [Urine:450; Drains:175] Intake/Output this shift: Total I/O In: 0  Out: 645 [Urine:600; Drains:45]  Exam: Abdomen soft, incisions clean Drains serosang/purulent G-tube stable  Lab Results:  No results for input(s): WBC, HGB, HCT, PLT in the last 72 hours. BMET No results for input(s): NA, K, CL, CO2, GLUCOSE, BUN, CREATININE, CALCIUM in the last 72 hours. PT/INR  Recent Labs  07/06/16 1053  LABPROT 14.7  INR 1.15   ABG No results for input(s): PHART, HCO3 in the last 72 hours.  Invalid input(s): PCO2, PO2  Studies/Results: Ct Abdomen Pelvis W Contrast  Result Date: 07/05/2016 CLINICAL DATA:  History of recent colostomy reversal with fever and pain, initial encounter EXAM: CT ABDOMEN AND PELVIS WITH CONTRAST TECHNIQUE: Multidetector CT imaging of the abdomen and pelvis was performed using the standard protocol following bolus administration of intravenous contrast. CONTRAST:  160mL ISOVUE-300 IOPAMIDOL (ISOVUE-300) INJECTION 61% COMPARISON:  12/25/2015 FINDINGS: Lower chest: Bilateral lower lobe consolidation is noted right slightly greater than left. Small associated pleural effusions are noted bilaterally. Hepatobiliary: The liver itself is within normal limits as is the gallbladder. No biliary ductal dilatation is seen. A small amount of free air is noted surrounding the liver consistent with the recent surgery. Pancreas: Unremarkable. No pancreatic ductal dilatation or  surrounding inflammatory changes. Spleen: The spleen itself is within normal limits. Adjacent to the spleen and extending along the rope left pericolic gutter there is a large crests enteric air-fluid collection which measures approximately 17 cm in greatest AP dimension and approximately 4.9 cm in greatest transverse dimension. It has a somewhat multiloculated appearance superiorly along the posterior margin of the spleen. Adrenals/Urinary Tract: The adrenal glands are within normal limits. The kidneys again demonstrate a right renal cyst stable from the prior exam. Tiny nonobstructing calculi are again seen bilaterally. The bladder is partially distended without focal abnormality. Stomach/Bowel: There is been interval take down of the patient's left lower quadrant ostomy and reduction of the parastomal hernia. 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 The previously seen duodenostomy tube has been removed. Postsurgical changes consistent with a reanastomosis are noted in the colon. Adjacent to these postsurgical changes there is some air identified best noted on image number 69 of series 3. It would be difficult to exclude a breakdown of the anastomosis in this region. The stomach is distended with contrast material in there is contrast within the distal esophagus as well this likely represents reflux. A gastrostomy catheter is noted anteriorly within the gastric lumen. Vascular/Lymphatic: Aortic atherosclerosis. No enlarged abdominal or pelvic lymph nodes. Reproductive: Prostate is unremarkable. Other: The air-fluid level in the left abdomen described above. Additionally there are at least 2 other fluid collections 1 in the mid abdomen best seen on image number 61 of series 3 measuring approximately 9.5 by 3.6 cm in greatest transverse and AP dimensions. A third fluid collection is noted deep within the pelvis measuring approximately 11.2 cm by 5.8 cm best seen on image number 78 of series  3. This  also appears to have a small continuation with a larger collection measuring approximately 15 cm in greatest transverse dimension in the anterior aspect of the abdomen best seen on image number 73 of series 3. These do not appear to be related to any loops of bowel and are consistent with postoperative air-fluid collections an likely developing abscesses. A fourth air-fluid collection is noted just beneath the anterior abdominal wall best seen on image number 47 of series 3 in a somewhat bilobed fashion. This may simply represent free air and free fluid as opposed to a developing collection. Surgical drains are noted in the subcutaneous fat of the anterior abdominal wall consistent with the given clinical history. Musculoskeletal: No acute bony abnormality is noted. IMPRESSION: Bilateral lower lobe consolidation and associated effusions. Postsurgical changes within the colon consistent with the recent colostomy takedown as well as postsurgical changes related to the hernia repair. There are multiple air-fluid collections as described throughout the abdomen suspicious for postoperative abscesses. Additionally some free air is noted as well as possible fluid loculated anteriorly beneath the anterior abdominal wall. Stable nonobstructing bilateral renal calculi. The previously seen bladder calculi are appreciated within small bladder diverticulum although not mentioned in the body of the report. These results will be called to the ordering clinician or representative by the Radiologist Assistant, and communication documented in the PACS or zVision Dashboard. Electronically Signed   By: Inez Catalina M.D.   On: 07/05/2016 14:24    Anti-infectives: Anti-infectives    Start     Dose/Rate Route Frequency Ordered Stop   07/06/16 0100  piperacillin-tazobactam (ZOSYN) IVPB 3.375 g     3.375 g 12.5 mL/hr over 240 Minutes Intravenous Every 8 hours 07/05/16 1833     07/05/16 1900  piperacillin-tazobactam (ZOSYN) IVPB  3.375 g     3.375 g 100 mL/hr over 30 Minutes Intravenous  Once 07/05/16 1835 07/05/16 2049   07/05/16 1000  amoxicillin-clavulanate (AUGMENTIN) 875-125 MG per tablet 1 tablet  Status:  Discontinued     1 tablet Oral Every 12 hours 07/05/16 0937 07/05/16 1833   06/28/16 0920  cefoTEtan in Dextrose 5% (CEFOTAN) 2-2.08 GM-% IVPB  Status:  Discontinued    Comments:  Elige Ko   : cabinet override      06/28/16 0920 06/28/16 0924   06/28/16 0830  cefoTEtan (CEFOTAN) 2 g in dextrose 5 % 50 mL IVPB     2 g 100 mL/hr over 30 Minutes Intravenous To Short Stay 06/28/16 0814 06/28/16 0941   06/28/16 0818  cefoTEtan in Dextrose 5% (CEFOTAN) 2-2.08 GM-% IVPB    Comments:  Precious Haws   : cabinet override      06/28/16 0818 06/28/16 2029      Assessment/Plan: s/p Procedure(s): COLOSTOMY TAKEDOWN (N/A)   I suspect fluid intra-abdominally was leaking tube feeds although can not totally rule out a contained leak from the anastomosis  Continue antibiotics Resume tube feeds Decrease IVF   LOS: 9 days    Itzayanna Kaster A 07/07/2016

## 2016-07-07 NOTE — Progress Notes (Signed)
Referring Physician(s): Dr Evlyn Courier  Supervising Physician: Arne Cleveland  Patient Status:  [facility] - In-pt  Chief Complaint:  Intra abdominal abscess drain x 2 placed 4/11   Subjective:  Good output Painful abd    Allergies: No known allergies  Medications: Prior to Admission medications   Medication Sig Start Date End Date Taking? Authorizing Provider  acetaminophen (TYLENOL) 500 MG tablet Take 1 tablet (500 mg total) by mouth every 8 (eight) hours as needed for mild pain (for pain). 12/05/15  Yes Darci Current Simaan, PA-C  aspirin EC 81 MG tablet Take 81 mg by mouth daily after supper.    Yes Historical Provider, MD  famotidine (PEPCID) 20 MG tablet Take 1 tablet (20 mg total) by mouth 2 (two) times daily. 11/09/15  Yes Elwin Mocha, MD  ferrous sulfate 325 (65 FE) MG tablet Take 325 mg by mouth daily.   Yes Historical Provider, MD  Multiple Vitamin (MULTIVITAMIN WITH MINERALS) TABS tablet Take 1 tablet by mouth daily.   Yes Historical Provider, MD  Nutritional Supplements (FEEDING SUPPLEMENT, OSMOLITE 1.5 CAL,) LIQD Place 1,000 mLs into feeding tube continuous.   Yes Historical Provider, MD  Omega-3 Fatty Acids (FISH OIL) 1000 MG CPDR Take 1,000 mg by mouth daily.   Yes Historical Provider, MD  Probiotic Product (ALIGN) 4 MG CAPS Take 1 capsule by mouth daily.   Yes Historical Provider, MD  tamsulosin (FLOMAX) 0.4 MG CAPS capsule Take 1 capsule (0.4 mg total) by mouth daily. 01/04/16  Yes Fanny Skates, MD  traMADol (ULTRAM) 50 MG tablet Take 50 mg by mouth every 6 (six) hours as needed for moderate pain.  05/21/16  Yes Historical Provider, MD  zolpidem (AMBIEN) 10 MG tablet Take 5-10 mg by mouth at bedtime as needed and may repeat dose one time if needed for sleep.    Yes Historical Provider, MD  diclofenac sodium (VOLTAREN) 1 % GEL Apply 1 application topically 2 (two) times daily as needed (back and shoulder pain).    Historical Provider, MD     Vital Signs: BP  139/88 (BP Location: Left Arm)   Pulse (!) 104   Temp 99.9 F (37.7 C)   Resp 19   Ht 5\' 6"  (1.676 m)   Wt 138 lb 14.2 oz (63 kg)   SpO2 99%   BMI 22.42 kg/m   Physical Exam  Constitutional: He is oriented to person, place, and time.  Abdominal: Soft. There is tenderness.  Musculoskeletal: Normal range of motion.  Neurological: He is alert and oriented to person, place, and time.  Skin: Skin is warm and dry.  Sites of drain s clean and dry NT No bleeding  Output #1--upper abd left: thin bloody fluid 75 cc yesterday; 45 this am  Output #2--low mid abd: thicker purulent bloody fluid 100 cc yesterday; 45 today  Tmax 101.9; 99.9 now Wbc wnl   Psychiatric: He has a normal mood and affect. His behavior is normal.  Nursing note and vitals reviewed.   Imaging: Ct Abdomen Pelvis W Contrast  Result Date: 07/05/2016 CLINICAL DATA:  History of recent colostomy reversal with fever and pain, initial encounter EXAM: CT ABDOMEN AND PELVIS WITH CONTRAST TECHNIQUE: Multidetector CT imaging of the abdomen and pelvis was performed using the standard protocol following bolus administration of intravenous contrast. CONTRAST:  138mL ISOVUE-300 IOPAMIDOL (ISOVUE-300) INJECTION 61% COMPARISON:  12/25/2015 FINDINGS: Lower chest: Bilateral lower lobe consolidation is noted right slightly greater than left. Small associated pleural effusions are  noted bilaterally. Hepatobiliary: The liver itself is within normal limits as is the gallbladder. No biliary ductal dilatation is seen. A small amount of free air is noted surrounding the liver consistent with the recent surgery. Pancreas: Unremarkable. No pancreatic ductal dilatation or surrounding inflammatory changes. Spleen: The spleen itself is within normal limits. Adjacent to the spleen and extending along the rope left pericolic gutter there is a large crests enteric air-fluid collection which measures approximately 17 cm in greatest AP dimension and  approximately 4.9 cm in greatest transverse dimension. It has a somewhat multiloculated appearance superiorly along the posterior margin of the spleen. Adrenals/Urinary Tract: The adrenal glands are within normal limits. The kidneys again demonstrate a right renal cyst stable from the prior exam. Tiny nonobstructing calculi are again seen bilaterally. The bladder is partially distended without focal abnormality. Stomach/Bowel: There is been interval take down of the patient's left lower quadrant ostomy and reduction of the parastomal hernia. 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 The previously seen duodenostomy tube has been removed. Postsurgical changes consistent with a reanastomosis are noted in the colon. Adjacent to these postsurgical changes there is some air identified best noted on image number 69 of series 3. It would be difficult to exclude a breakdown of the anastomosis in this region. The stomach is distended with contrast material in there is contrast within the distal esophagus as well this likely represents reflux. A gastrostomy catheter is noted anteriorly within the gastric lumen. Vascular/Lymphatic: Aortic atherosclerosis. No enlarged abdominal or pelvic lymph nodes. Reproductive: Prostate is unremarkable. Other: The air-fluid level in the left abdomen described above. Additionally there are at least 2 other fluid collections 1 in the mid abdomen best seen on image number 61 of series 3 measuring approximately 9.5 by 3.6 cm in greatest transverse and AP dimensions. A third fluid collection is noted deep within the pelvis measuring approximately 11.2 cm by 5.8 cm best seen on image number 78 of series 3. This also appears to have a small continuation with a larger collection measuring approximately 15 cm in greatest transverse dimension in the anterior aspect of the abdomen best seen on image number 73 of series 3. These do not appear to be related to any loops of bowel and are  consistent with postoperative air-fluid collections an likely developing abscesses. A fourth air-fluid collection is noted just beneath the anterior abdominal wall best seen on image number 47 of series 3 in a somewhat bilobed fashion. This may simply represent free air and free fluid as opposed to a developing collection. Surgical drains are noted in the subcutaneous fat of the anterior abdominal wall consistent with the given clinical history. Musculoskeletal: No acute bony abnormality is noted. IMPRESSION: Bilateral lower lobe consolidation and associated effusions. Postsurgical changes within the colon consistent with the recent colostomy takedown as well as postsurgical changes related to the hernia repair. There are multiple air-fluid collections as described throughout the abdomen suspicious for postoperative abscesses. Additionally some free air is noted as well as possible fluid loculated anteriorly beneath the anterior abdominal wall. Stable nonobstructing bilateral renal calculi. The previously seen bladder calculi are appreciated within small bladder diverticulum although not mentioned in the body of the report. These results will be called to the ordering clinician or representative by the Radiologist Assistant, and communication documented in the PACS or zVision Dashboard. Electronically Signed   By: Inez Catalina M.D.   On: 07/05/2016 14:24   Dg Chest Port 1 View  Result Date: 07/04/2016  CLINICAL DATA:  Fever. History of hypertension, hiatal hernia repair. EXAM: PORTABLE CHEST 1 VIEW COMPARISON:  Chest x-ray dated 12/29/2015. FINDINGS: Mediastinum appears widened, possibly accentuated by the lordotic and semi upright patient positioning. Heart size appears grossly stable. Hazy opacities at each lung base, likely atelectasis and/or small pleural effusions. Upper lungs are relatively clear. No pneumothorax seen. No acute or suspicious osseous finding. IMPRESSION: 1. Mediastinum appears widened, likely  accentuated to some degree by the lordotic and semi upright patient positioning. Mediastinal mass or acute mediastinal abnormality cannot be excluded. Consider repeat chest x-ray with patient in a more upright position if possible. If any chest pain, would consider chest CT for definitive characterization. 2. Hazy opacities at each lung base, likely atelectasis and/or small pleural effusions. Electronically Signed   By: Franki Cabot M.D.   On: 07/04/2016 13:10    Labs:  CBC:  Recent Labs  06/30/16 0621 07/01/16 0313 07/03/16 0430 07/04/16 0313  WBC 8.1 4.7 8.3 8.2  HGB 8.1* 12.7* 10.6* 10.1*  HCT 24.4* 37.6* 32.7* 30.2*  PLT 166 162 227 268    COAGS:  Recent Labs  10/29/15 1851 10/30/15 1245 10/30/15 1636 11/06/15 0453 06/30/16 1605 07/06/16 1053  INR 1.28 1.42 1.34 1.38 1.29 1.15  APTT 29 27 25   --  32  --     BMP:  Recent Labs  06/21/16 1028 06/29/16 1143 07/01/16 0313 07/04/16 0313  NA 136 139 138 136  K 4.6 4.5 4.6 3.5  CL 99* 108 106 100*  CO2 29 24 23 29   GLUCOSE 107* 141* 101* 132*  BUN 16 22* 18 14  CALCIUM 9.2 8.0* 7.9* 7.5*  CREATININE 0.92 1.77* 0.91 0.74  GFRNONAA >60 38* >60 >60  GFRAA >60 44* >60 >60    LIVER FUNCTION TESTS:  Recent Labs  12/13/15 0454 12/22/15 1234 12/25/15 1736 12/26/15 0636  BILITOT 0.5 0.6 0.5 0.8  AST 22 21 26 17   ALT 39 21 21 16*  ALKPHOS 250* 105 108 87  PROT 5.9* 5.8* 6.6 5.4*  ALBUMIN 2.3* 2.2* 2.6* 2.1*    Assessment and Plan:  Intra abd collections Drain x 2 Stable; sore Will follow   Electronically Signed: Deven Audi A 07/07/2016, 1:55 PM   I spent a total of 15 Minutes at the the patient's bedside AND on the patient's hospital floor or unit, greater than 50% of which was counseling/coordinating care for abd drains x 2

## 2016-07-07 NOTE — Progress Notes (Signed)
Pt. febrile 101.8 (O)- Dr. Reece Agar notified; Tylenol given.

## 2016-07-07 NOTE — Consult Note (Signed)
   [facility] Inpatient Consult   07/07/2016  ZYHEIR DAFT [birth year] [id number]    [facility] Care Management/ Link to Wellness for [place] employees/dependents with [facility] insurance follow up. Mr. Arismendez is known to Probation officer and Telephonic Link to [employer]. He does not have any current Link to Wellness. However, support and telephonic follow up has been provided after his past hospitalizations.   Went to bedside to speak with Mr. Lucchese today. He states " I am feeling a little rough". He was trying to rest during bedside visit. Therefore, bedside encounter was brief. Provided encouragement. Will continue follow.    [name], MSN-Ed, RN,BSN [facility] Liaison [phone]

## 2016-07-07 NOTE — Plan of Care (Signed)
Problem: Education: Goal: Knowledge of [facility] Education information/materials will improve Outcome: Progressing POC reviewed with pt, and discussed pain management; rested better last night.

## 2016-07-07 NOTE — Progress Notes (Signed)
Nutrition Follow-up  DOCUMENTATION CODES:   Not applicable  INTERVENTION:   Resume Osmolite 1.5 @ 55 ml/hr via PEG over 18 hours with 75 ml free water flush 4 times per day. Complete regimen provides 1485 kcals, 62 grams protein, and 1054 ml free water (provides 87% of estimated kcal needs and 69% of estimated protein needs).    NUTRITION DIAGNOSIS:   Inadequate oral intake related to altered GI function as evidenced by  (PEG dependent).  Ongoing  GOAL:   Patient will meet greater than or equal to 90% of their needs  Progressing  MONITOR:   PO intake, Diet advancement, Labs, TF tolerance, Skin, I & O's  REASON FOR ASSESSMENT:   Consult Enteral/tube feeding initiation and management  ASSESSMENT:   67 year old male s/p emergent Hartman's procedure for perforated sigmoid diverticulitis almost one year ago.  Since that time, he has had emergent gastric surgery and has a feeding tube in place.  He has recovered well from that surgery and wishes to proceed with a colostomy takedown.  He currently has no complaints  S/p Procedure(s) 06/28/16: COLOSTOMY TAKEDOWN SEGMENTAL COLECTOMY INCISIONAL HERNIA REPAIR  4/9- TF stopped per MD orders 4/10- CT scan revealed multiple fluid collections, concern that g-tune was dislodged and has TF going intraabdominally 4/10- s/p CT guided drainage of abdominal abscesses x 2; advancement and injection of gastrostomy tube   RD consulted to re-start TF this AM.   Pt sleeping soundly at time of visit; RD did not disturb.   Pt is currently on clear liquid diet. Intake has been minimal (0%), however, this is typical for pt.   Home TF regimen is Osmolite 1.5 @ 55 ml/hr via PEG over 18 hours with 75 ml free water flush 4 times per day. Complete regimen provides 1485 kcals, 62 grams protein, and 1054 ml free water (provides 87% of estimated kcal needs and 69% of estimated protein needs). RD will resume regimen.   Labs reviewed: CBGS: 159.   Diet  Order:  Diet clear liquid Room service appropriate? Yes; Fluid consistency: Thin  Skin:  Reviewed, no issues  Last BM:  06/30/16  Height:   Ht Readings from Last 1 Encounters:  06/29/16 5\' 6"  (1.676 m)    Weight:   Wt Readings from Last 1 Encounters:  06/29/16 138 lb 14.2 oz (63 kg)    Ideal Body Weight:  64.5 kg  BMI:  Body mass index is 22.42 kg/m.  Estimated Nutritional Needs:   Kcal:  1700-1900  Protein:  90-105 grams  Fluid:  1.7-1.9 L  EDUCATION NEEDS:   Education needs addressed  Welby Montminy A. Jimmye Norman, RD, LDN, CDE Pager: [phone] After hours Pager: [phone]

## 2016-07-08 LAB — GLUCOSE, CAPILLARY
GLUCOSE-CAPILLARY: 139 mg/dL — AB (ref 65–99)
GLUCOSE-CAPILLARY: 136 mg/dL — AB (ref 65–99)
GLUCOSE-CAPILLARY: 114 mg/dL — AB (ref 65–99)
Glucose-Capillary: 124 mg/dL — ABNORMAL HIGH (ref 65–99)
Glucose-Capillary: 142 mg/dL — ABNORMAL HIGH (ref 65–99)
Glucose-Capillary: 167 mg/dL — ABNORMAL HIGH (ref 65–99)

## 2016-07-08 LAB — CBC
HEMATOCRIT: 28.3 % — AB (ref 39.0–52.0)
HEMOGLOBIN: 9.4 g/dL — AB (ref 13.0–17.0)
MCH: 29.9 pg (ref 26.0–34.0)
MCHC: 33.2 g/dL (ref 30.0–36.0)
MCV: 90.1 fL (ref 78.0–100.0)
Platelets: 467 10*3/uL — ABNORMAL HIGH (ref 150–400)
RBC: 3.14 MIL/uL — ABNORMAL LOW (ref 4.22–5.81)
RDW: 15 % (ref 11.5–15.5)
WBC: 14.4 10*3/uL — AB (ref 4.0–10.5)

## 2016-07-08 LAB — BASIC METABOLIC PANEL
ANION GAP: 8 (ref 5–15)
BUN: 13 mg/dL (ref 6–20)
CHLORIDE: 103 mmol/L (ref 101–111)
CO2: 22 mmol/L (ref 22–32)
Calcium: 6.8 mg/dL — ABNORMAL LOW (ref 8.9–10.3)
Creatinine, Ser: 0.93 mg/dL (ref 0.61–1.24)
GFR calc non Af Amer: >60 mL/min (ref >60)
Glucose, Bld: 146 mg/dL — ABNORMAL HIGH (ref 65–99)
Potassium: 3.8 mmol/L (ref 3.5–5.1)
Sodium: 133 mmol/L — ABNORMAL LOW (ref 135–145)

## 2016-07-08 NOTE — Progress Notes (Signed)
10 Days Post-Op  Subjective: Continues to feel better. Still with fevers  Objective: Vital signs in last 24 hours: Temp:  [97.8 F (36.6 C)-102.2 F (39 C)] 97.8 F (36.6 C) (04/12 0430) Pulse Rate:  [99-125] 99 (04/12 0430) Resp:  [18] 18 (04/12 0430) BP: (100-127)/(60-72) 100/60 (04/12 0430) SpO2:  [92 %-94 %] 92 % (04/12 0430) Weight:  [57.8 kg (127 lb 6.8 oz)] 57.8 kg (127 lb 6.8 oz) (04/12 0500) Last BM Date: 07/07/16  Intake/Output from previous day: 04/11 0701 - 04/12 0700 In: 1618.3 [P.O.:380; I.V.:1098.3; IV Piggyback:100] Out: 2150 [Urine:1750; Drains:400] Intake/Output this shift: Total I/O In: -  Out: 20 [Drains:20]  Exam: Abdomen soft, drains seropurulent Incisions clean  Lab Results:   Recent Labs  07/08/16 0412  WBC 14.4*  HGB 9.4*  HCT 28.3*  PLT 467*   BMET  Recent Labs  07/08/16 0412  NA 133*  K 3.8  CL 103  CO2 22  GLUCOSE 146*  BUN 13  CREATININE 0.93  CALCIUM 6.8*   PT/INR  Recent Labs  07/06/16 1053  LABPROT 14.7  INR 1.15   ABG No results for input(s): PHART, HCO3 in the last 72 hours.  Invalid input(s): PCO2, PO2  Studies/Results: Ct Image Guided Fluid Drain By Catheter  Result Date: 07/07/2016 INDICATION: Status post colostomy takedown and development of intra-abdominal peritoneal fluid collections. CT has demonstrated potential partial retraction of [name] gastrostomy tube abutting the abdominal wall. EXAM: CT-GUIDED PERCUTANEOUS DRAINAGE PERITONEAL ABSCESS FLUID COLLECTIONS X 2 MEDICATIONS: The patient is currently admitted to the hospital and receiving intravenous antibiotics. The antibiotics were administered within an appropriate time frame prior to the initiation of the procedure. ANESTHESIA/SEDATION: Fentanyl 100 mcg IV; Versed 2.0 mg IV Moderate Sedation Time:  45 minutes. The patient was continuously monitored during the procedure by the interventional radiology nurse under my direct supervision. COMPLICATIONS: None  immediate. PROCEDURE: Informed written consent was obtained from the patient after [name] thorough discussion of the procedural risks, benefits and alternatives. All questions were addressed. Maximal Sterile Barrier Technique was utilized including caps, mask, sterile gowns, sterile gloves, sterile drape, hand hygiene and skin antiseptic. [name] timeout was performed prior to the initiation of the procedure. Initial CT was performed in [name] supine position. The indwelling gastrostomy tube was manipulated by first deflating the retention balloon completely. The tube was then gently advance. The inflation balloon was inflated with 8 mL of saline. The tube was then injected with 20 mL of diluted contrast and additional CT images performed. [name] left lateral upper abdominal peritoneal fluid collection was localized. After inserting an 18 gauge trocar needle into the collection, aspiration of fluid was performed. [name] guidewire was advanced into the collection. The tract was dilated over the guidewire and [name] 10 French percutaneous drain placed. [name] fluid sample was sent for culture analysis. An anterior lower abdominal fluid collection was then localized. An 18 gauge trocar needle was advanced from [name] left lateral approach. After confirming needle position, [name] fluid sample was aspirated. [name] guidewire was advanced. The tract was dilated and [name] 12 French percutaneous drainage catheter placed. [name] fluid sample was sent for culture analysis. Both drains were connected to suction bulbs and secured at their exit sites with Prolene retention sutures. StatLock devices were also utilized. FINDINGS: Initial CT demonstrates further migration of the gastrostomy tube such that the retention balloon lies in the abdominal wall and the tip of the catheter lies barely deep to the abdominal wall musculature. After deflating the retention  balloon, the tube was able to be advanced without difficulty back into the lumen of the stomach. Contrast injection confirms  intraluminal position. The left lateral fluid collection immediately inferior to the spleen yielded thin appearing, bloody fluid. [name] 10 French drain was placed in this collection. Large anterior fluid collection in the lower abdomen and upper pelvis spanning across the midline and containing an air-fluid level clearly was not bowel and yielded foul-smelling, purulent appearing and blood tinged fluid. [name] 12 French drain was placed in this collection. IMPRESSION: 1. Advancement of gastrostomy tube back into the lumen of the stomach. Initial CT showed further migration of the gastrostomy tube into the abdominal wall. Intraluminal positioning was confirmed with injection of diluted contrast material. 2. Placement of 2 separate percutaneous drainage catheters. [name] 10 French drain was placed in [name] left lateral upper abdominal collection yielding thin, bloody fluid. [name] sample of this fluid was sent for culture analysis. [name] 12 French drain was placed in an anterior lower abdominal/pelvic collection yielding thicker, purulent and bloody fluid. [name] sample of this fluid was sent for separate culture analysis. Electronically Signed   By: Aletta Edouard M.D.   On: 07/07/2016 16:44   Ct Image Guided Fluid Drain By Catheter  Result Date: 07/07/2016 INDICATION: Status post colostomy takedown and development of intra-abdominal peritoneal fluid collections. CT has demonstrated potential partial retraction of [name] gastrostomy tube abutting the abdominal wall. EXAM: CT-GUIDED PERCUTANEOUS DRAINAGE PERITONEAL ABSCESS FLUID COLLECTIONS X 2 MEDICATIONS: The patient is currently admitted to the hospital and receiving intravenous antibiotics. The antibiotics were administered within an appropriate time frame prior to the initiation of the procedure. ANESTHESIA/SEDATION: Fentanyl 100 mcg IV; Versed 2.0 mg IV Moderate Sedation Time:  45 minutes. The patient was continuously monitored during the procedure by the interventional radiology nurse under  my direct supervision. COMPLICATIONS: None immediate. PROCEDURE: Informed written consent was obtained from the patient after [name] thorough discussion of the procedural risks, benefits and alternatives. All questions were addressed. Maximal Sterile Barrier Technique was utilized including caps, mask, sterile gowns, sterile gloves, sterile drape, hand hygiene and skin antiseptic. [name] timeout was performed prior to the initiation of the procedure. Initial CT was performed in [name] supine position. The indwelling gastrostomy tube was manipulated by first deflating the retention balloon completely. The tube was then gently advance. The inflation balloon was inflated with 8 mL of saline. The tube was then injected with 20 mL of diluted contrast and additional CT images performed. [name] left lateral upper abdominal peritoneal fluid collection was localized. After inserting an 18 gauge trocar needle into the collection, aspiration of fluid was performed. [name] guidewire was advanced into the collection. The tract was dilated over the guidewire and [name] 10 French percutaneous drain placed. [name] fluid sample was sent for culture analysis. An anterior lower abdominal fluid collection was then localized. An 18 gauge trocar needle was advanced from [name] left lateral approach. After confirming needle position, [name] fluid sample was aspirated. [name] guidewire was advanced. The tract was dilated and [name] 12 French percutaneous drainage catheter placed. [name] fluid sample was sent for culture analysis. Both drains were connected to suction bulbs and secured at their exit sites with Prolene retention sutures. StatLock devices were also utilized. FINDINGS: Initial CT demonstrates further migration of the gastrostomy tube such that the retention balloon lies in the abdominal wall and the tip of the catheter lies barely deep to the abdominal wall musculature. After deflating the retention balloon, the tube was able to be  advanced without difficulty back into the lumen of the  stomach. Contrast injection confirms intraluminal position. The left lateral fluid collection immediately inferior to the spleen yielded thin appearing, bloody fluid. [name] 10 French drain was placed in this collection. Large anterior fluid collection in the lower abdomen and upper pelvis spanning across the midline and containing an air-fluid level clearly was not bowel and yielded foul-smelling, purulent appearing and blood tinged fluid. [name] 12 French drain was placed in this collection. IMPRESSION: 1. Advancement of gastrostomy tube back into the lumen of the stomach. Initial CT showed further migration of the gastrostomy tube into the abdominal wall. Intraluminal positioning was confirmed with injection of diluted contrast material. 2. Placement of 2 separate percutaneous drainage catheters. [name] 10 French drain was placed in [name] left lateral upper abdominal collection yielding thin, bloody fluid. [name] sample of this fluid was sent for culture analysis. [name] 12 French drain was placed in an anterior lower abdominal/pelvic collection yielding thicker, purulent and bloody fluid. [name] sample of this fluid was sent for separate culture analysis. Electronically Signed   By: Aletta Edouard M.D.   On: 07/07/2016 16:44    Anti-infectives: Anti-infectives    Start     Dose/Rate Route Frequency Ordered Stop   07/06/16 0100  piperacillin-tazobactam (ZOSYN) IVPB 3.375 g     3.375 g 12.5 mL/hr over 240 Minutes Intravenous Every 8 hours 07/05/16 1833     07/05/16 1900  piperacillin-tazobactam (ZOSYN) IVPB 3.375 g     3.375 g 100 mL/hr over 30 Minutes Intravenous  Once 07/05/16 1835 07/05/16 2049   07/05/16 1000  amoxicillin-clavulanate (AUGMENTIN) 875-125 MG per tablet 1 tablet  Status:  Discontinued     1 tablet Oral Every 12 hours 07/05/16 0937 07/05/16 1833   06/28/16 0920  cefoTEtan in Dextrose 5% (CEFOTAN) 2-2.08 GM-% IVPB  Status:  Discontinued    Comments:  Jafari, [place]   : cabinet override      06/28/16 0920 06/28/16  0924   06/28/16 0830  cefoTEtan (CEFOTAN) 2 g in dextrose 5 % 50 mL IVPB     2 g 100 mL/hr over 30 Minutes Intravenous To Short Stay 06/28/16 0814 06/28/16 0941   06/28/16 0818  cefoTEtan in Dextrose 5% (CEFOTAN) 2-2.08 GM-% IVPB    Comments:  Precious Haws   : cabinet override      06/28/16 0818 06/28/16 2029      Assessment/Plan: s/p Procedure(s): COLOSTOMY TAKEDOWN (N/[name])  Intra-abdominal abscess s/p IR placed drains  Awaiting susceptibilities on cultures before changing antibiotics. Continue tube feeds Staple out tomorrow  LOS: 10 days    Miguel [name] 07/08/2016

## 2016-07-08 NOTE — Progress Notes (Signed)
Referring Physician(s):  Dr Evlyn Courier  Supervising Physician: Arne Cleveland  Patient Status:  [facility] - In-pt  Chief Complaint: Intra abdominal abscess drain x 2 placed 4/11  Subjective: Improved abdominal pain, febrile overnight.  Allergies: No known allergies  Medications: Prior to Admission medications   Medication Sig Start Date End Date Taking? Authorizing Provider  acetaminophen (TYLENOL) 500 MG tablet Take 1 tablet (500 mg total) by mouth every 8 (eight) hours as needed for mild pain (for pain). 12/05/15  Yes Darci Current Simaan, PA-C  aspirin EC 81 MG tablet Take 81 mg by mouth daily after supper.    Yes Historical Provider, MD  famotidine (PEPCID) 20 MG tablet Take 1 tablet (20 mg total) by mouth 2 (two) times daily. 11/09/15  Yes Elwin Mocha, MD  ferrous sulfate 325 (65 FE) MG tablet Take 325 mg by mouth daily.   Yes Historical Provider, MD  Multiple Vitamin (MULTIVITAMIN WITH MINERALS) TABS tablet Take 1 tablet by mouth daily.   Yes Historical Provider, MD  Nutritional Supplements (FEEDING SUPPLEMENT, OSMOLITE 1.5 CAL,) LIQD Place 1,000 mLs into feeding tube continuous.   Yes Historical Provider, MD  Omega-3 Fatty Acids (FISH OIL) 1000 MG CPDR Take 1,000 mg by mouth daily.   Yes Historical Provider, MD  Probiotic Product (ALIGN) 4 MG CAPS Take 1 capsule by mouth daily.   Yes Historical Provider, MD  tamsulosin (FLOMAX) 0.4 MG CAPS capsule Take 1 capsule (0.4 mg total) by mouth daily. 01/04/16  Yes Fanny Skates, MD  traMADol (ULTRAM) 50 MG tablet Take 50 mg by mouth every 6 (six) hours as needed for moderate pain.  05/21/16  Yes Historical Provider, MD  zolpidem (AMBIEN) 10 MG tablet Take 5-10 mg by mouth at bedtime as needed and may repeat dose one time if needed for sleep.    Yes Historical Provider, MD  diclofenac sodium (VOLTAREN) 1 % GEL Apply 1 application topically 2 (two) times daily as needed (back and shoulder pain).    Historical Provider, MD     Vital  Signs: BP 128/74 (BP Location: Right Arm)   Pulse (!) 109   Temp 98.4 F (36.9 C) (Oral)   Resp 18   Ht 5\' 6"  (1.676 m)   Wt 127 lb 6.8 oz (57.8 kg)   SpO2 91%   BMI 20.57 kg/m   Physical Exam  Constitutional: He is oriented to person, place, and time.  Abdominal: Soft. There is tenderness.  Musculoskeletal: Normal range of motion.  Neurological: He is alert and oriented to person, place, and time.  Skin: Skin is warm and dry.  Mild soreness to touch.  Drain sites intact, clean, and dry.   Psychiatric: He has a normal mood and affect. His behavior is normal.  Nursing note and vitals reviewed.   Imaging: Ct Abdomen Pelvis W Contrast  Result Date: 07/05/2016 CLINICAL DATA:  History of recent colostomy reversal with fever and pain, initial encounter EXAM: CT ABDOMEN AND PELVIS WITH CONTRAST TECHNIQUE: Multidetector CT imaging of the abdomen and pelvis was performed using the standard protocol following bolus administration of intravenous contrast. CONTRAST:  173mL ISOVUE-300 IOPAMIDOL (ISOVUE-300) INJECTION 61% COMPARISON:  12/25/2015 FINDINGS: Lower chest: Bilateral lower lobe consolidation is noted right slightly greater than left. Small associated pleural effusions are noted bilaterally. Hepatobiliary: The liver itself is within normal limits as is the gallbladder. No biliary ductal dilatation is seen. A small amount of free air is noted surrounding the liver consistent with the recent surgery.  Pancreas: Unremarkable. No pancreatic ductal dilatation or surrounding inflammatory changes. Spleen: The spleen itself is within normal limits. Adjacent to the spleen and extending along the rope left pericolic gutter there is a large crests enteric air-fluid collection which measures approximately 17 cm in greatest AP dimension and approximately 4.9 cm in greatest transverse dimension. It has a somewhat multiloculated appearance superiorly along the posterior margin of the spleen. Adrenals/Urinary  Tract: The adrenal glands are within normal limits. The kidneys again demonstrate a right renal cyst stable from the prior exam. Tiny nonobstructing calculi are again seen bilaterally. The bladder is partially distended without focal abnormality. Stomach/Bowel: There is been interval take down of the patient's left lower quadrant ostomy and reduction of the parastomal hernia. 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 The previously seen duodenostomy tube has been removed. Postsurgical changes consistent with a reanastomosis are noted in the colon. Adjacent to these postsurgical changes there is some air identified best noted on image number 69 of series 3. It would be difficult to exclude a breakdown of the anastomosis in this region. The stomach is distended with contrast material in there is contrast within the distal esophagus as well this likely represents reflux. A gastrostomy catheter is noted anteriorly within the gastric lumen. Vascular/Lymphatic: Aortic atherosclerosis. No enlarged abdominal or pelvic lymph nodes. Reproductive: Prostate is unremarkable. Other: The air-fluid level in the left abdomen described above. Additionally there are at least 2 other fluid collections 1 in the mid abdomen best seen on image number 61 of series 3 measuring approximately 9.5 by 3.6 cm in greatest transverse and AP dimensions. A third fluid collection is noted deep within the pelvis measuring approximately 11.2 cm by 5.8 cm best seen on image number 78 of series 3. This also appears to have a small continuation with a larger collection measuring approximately 15 cm in greatest transverse dimension in the anterior aspect of the abdomen best seen on image number 73 of series 3. These do not appear to be related to any loops of bowel and are consistent with postoperative air-fluid collections an likely developing abscesses. A fourth air-fluid collection is noted just beneath the anterior abdominal wall best seen  on image number 47 of series 3 in a somewhat bilobed fashion. This may simply represent free air and free fluid as opposed to a developing collection. Surgical drains are noted in the subcutaneous fat of the anterior abdominal wall consistent with the given clinical history. Musculoskeletal: No acute bony abnormality is noted. IMPRESSION: Bilateral lower lobe consolidation and associated effusions. Postsurgical changes within the colon consistent with the recent colostomy takedown as well as postsurgical changes related to the hernia repair. There are multiple air-fluid collections as described throughout the abdomen suspicious for postoperative abscesses. Additionally some free air is noted as well as possible fluid loculated anteriorly beneath the anterior abdominal wall. Stable nonobstructing bilateral renal calculi. The previously seen bladder calculi are appreciated within small bladder diverticulum although not mentioned in the body of the report. These results will be called to the ordering clinician or representative by the Radiologist Assistant, and communication documented in the PACS or zVision Dashboard. Electronically Signed   By: Inez Catalina M.D.   On: 07/05/2016 14:24   Dg Chest Port 1 View  Result Date: 07/04/2016 CLINICAL DATA:  Fever. History of hypertension, hiatal hernia repair. EXAM: PORTABLE CHEST 1 VIEW COMPARISON:  Chest x-ray dated 12/29/2015. FINDINGS: Mediastinum appears widened, possibly accentuated by the lordotic and semi upright patient positioning. Heart  size appears grossly stable. Hazy opacities at each lung base, likely atelectasis and/or small pleural effusions. Upper lungs are relatively clear. No pneumothorax seen. No acute or suspicious osseous finding. IMPRESSION: 1. Mediastinum appears widened, likely accentuated to some degree by the lordotic and semi upright patient positioning. Mediastinal mass or acute mediastinal abnormality cannot be excluded. Consider repeat chest  x-ray with patient in a more upright position if possible. If any chest pain, would consider chest CT for definitive characterization. 2. Hazy opacities at each lung base, likely atelectasis and/or small pleural effusions. Electronically Signed   By: Franki Cabot M.D.   On: 07/04/2016 13:10   Ct Image Guided Fluid Drain By Catheter  Result Date: 07/07/2016 INDICATION: Status post colostomy takedown and development of intra-abdominal peritoneal fluid collections. CT has demonstrated potential partial retraction of a gastrostomy tube abutting the abdominal wall. EXAM: CT-GUIDED PERCUTANEOUS DRAINAGE PERITONEAL ABSCESS FLUID COLLECTIONS X 2 MEDICATIONS: The patient is currently admitted to the hospital and receiving intravenous antibiotics. The antibiotics were administered within an appropriate time frame prior to the initiation of the procedure. ANESTHESIA/SEDATION: Fentanyl 100 mcg IV; Versed 2.0 mg IV Moderate Sedation Time:  45 minutes. The patient was continuously monitored during the procedure by the interventional radiology nurse under my direct supervision. COMPLICATIONS: None immediate. PROCEDURE: Informed written consent was obtained from the patient after a thorough discussion of the procedural risks, benefits and alternatives. All questions were addressed. Maximal Sterile Barrier Technique was utilized including caps, mask, sterile gowns, sterile gloves, sterile drape, hand hygiene and skin antiseptic. A timeout was performed prior to the initiation of the procedure. Initial CT was performed in a supine position. The indwelling gastrostomy tube was manipulated by first deflating the retention balloon completely. The tube was then gently advance. The inflation balloon was inflated with 8 mL of saline. The tube was then injected with 20 mL of diluted contrast and additional CT images performed. A left lateral upper abdominal peritoneal fluid collection was localized. After inserting an 18 gauge trocar  needle into the collection, aspiration of fluid was performed. A guidewire was advanced into the collection. The tract was dilated over the guidewire and a 10 French percutaneous drain placed. A fluid sample was sent for culture analysis. An anterior lower abdominal fluid collection was then localized. An 18 gauge trocar needle was advanced from a left lateral approach. After confirming needle position, a fluid sample was aspirated. A guidewire was advanced. The tract was dilated and a 12 French percutaneous drainage catheter placed. A fluid sample was sent for culture analysis. Both drains were connected to suction bulbs and secured at their exit sites with Prolene retention sutures. StatLock devices were also utilized. FINDINGS: Initial CT demonstrates further migration of the gastrostomy tube such that the retention balloon lies in the abdominal wall and the tip of the catheter lies barely deep to the abdominal wall musculature. After deflating the retention balloon, the tube was able to be advanced without difficulty back into the lumen of the stomach. Contrast injection confirms intraluminal position. The left lateral fluid collection immediately inferior to the spleen yielded thin appearing, bloody fluid. A 10 French drain was placed in this collection. Large anterior fluid collection in the lower abdomen and upper pelvis spanning across the midline and containing an air-fluid level clearly was not bowel and yielded foul-smelling, purulent appearing and blood tinged fluid. A 12 French drain was placed in this collection. IMPRESSION: 1. Advancement of gastrostomy tube back into the lumen of the stomach.  Initial CT showed further migration of the gastrostomy tube into the abdominal wall. Intraluminal positioning was confirmed with injection of diluted contrast material. 2. Placement of 2 separate percutaneous drainage catheters. A 10 French drain was placed in a left lateral upper abdominal collection yielding  thin, bloody fluid. A sample of this fluid was sent for culture analysis. A 12 French drain was placed in an anterior lower abdominal/pelvic collection yielding thicker, purulent and bloody fluid. A sample of this fluid was sent for separate culture analysis. Electronically Signed   By: Aletta Edouard M.D.   On: 07/07/2016 16:44   Ct Image Guided Fluid Drain By Catheter  Result Date: 07/07/2016 INDICATION: Status post colostomy takedown and development of intra-abdominal peritoneal fluid collections. CT has demonstrated potential partial retraction of a gastrostomy tube abutting the abdominal wall. EXAM: CT-GUIDED PERCUTANEOUS DRAINAGE PERITONEAL ABSCESS FLUID COLLECTIONS X 2 MEDICATIONS: The patient is currently admitted to the hospital and receiving intravenous antibiotics. The antibiotics were administered within an appropriate time frame prior to the initiation of the procedure. ANESTHESIA/SEDATION: Fentanyl 100 mcg IV; Versed 2.0 mg IV Moderate Sedation Time:  45 minutes. The patient was continuously monitored during the procedure by the interventional radiology nurse under my direct supervision. COMPLICATIONS: None immediate. PROCEDURE: Informed written consent was obtained from the patient after a thorough discussion of the procedural risks, benefits and alternatives. All questions were addressed. Maximal Sterile Barrier Technique was utilized including caps, mask, sterile gowns, sterile gloves, sterile drape, hand hygiene and skin antiseptic. A timeout was performed prior to the initiation of the procedure. Initial CT was performed in a supine position. The indwelling gastrostomy tube was manipulated by first deflating the retention balloon completely. The tube was then gently advance. The inflation balloon was inflated with 8 mL of saline. The tube was then injected with 20 mL of diluted contrast and additional CT images performed. A left lateral upper abdominal peritoneal fluid collection was  localized. After inserting an 18 gauge trocar needle into the collection, aspiration of fluid was performed. A guidewire was advanced into the collection. The tract was dilated over the guidewire and a 10 French percutaneous drain placed. A fluid sample was sent for culture analysis. An anterior lower abdominal fluid collection was then localized. An 18 gauge trocar needle was advanced from a left lateral approach. After confirming needle position, a fluid sample was aspirated. A guidewire was advanced. The tract was dilated and a 12 French percutaneous drainage catheter placed. A fluid sample was sent for culture analysis. Both drains were connected to suction bulbs and secured at their exit sites with Prolene retention sutures. StatLock devices were also utilized. FINDINGS: Initial CT demonstrates further migration of the gastrostomy tube such that the retention balloon lies in the abdominal wall and the tip of the catheter lies barely deep to the abdominal wall musculature. After deflating the retention balloon, the tube was able to be advanced without difficulty back into the lumen of the stomach. Contrast injection confirms intraluminal position. The left lateral fluid collection immediately inferior to the spleen yielded thin appearing, bloody fluid. A 10 French drain was placed in this collection. Large anterior fluid collection in the lower abdomen and upper pelvis spanning across the midline and containing an air-fluid level clearly was not bowel and yielded foul-smelling, purulent appearing and blood tinged fluid. A 12 French drain was placed in this collection. IMPRESSION: 1. Advancement of gastrostomy tube back into the lumen of the stomach. Initial CT showed further migration of the  gastrostomy tube into the abdominal wall. Intraluminal positioning was confirmed with injection of diluted contrast material. 2. Placement of 2 separate percutaneous drainage catheters. A 10 French drain was placed in a left  lateral upper abdominal collection yielding thin, bloody fluid. A sample of this fluid was sent for culture analysis. A 12 French drain was placed in an anterior lower abdominal/pelvic collection yielding thicker, purulent and bloody fluid. A sample of this fluid was sent for separate culture analysis. Electronically Signed   By: Aletta Edouard M.D.   On: 07/07/2016 16:44    Labs:  CBC:  Recent Labs  07/01/16 0313 07/03/16 0430 07/04/16 0313 07/08/16 0412  WBC 4.7 8.3 8.2 14.4*  HGB 12.7* 10.6* 10.1* 9.4*  HCT 37.6* 32.7* 30.2* 28.3*  PLT 162 227 268 467*    COAGS:  Recent Labs  10/29/15 1851 10/30/15 1245 10/30/15 1636 11/06/15 0453 06/30/16 1605 07/06/16 1053  INR 1.28 1.42 1.34 1.38 1.29 1.15  APTT 29 27 25   --  32  --     BMP:  Recent Labs  06/29/16 1143 07/01/16 0313 07/04/16 0313 07/08/16 0412  NA 139 138 136 133*  K 4.5 4.6 3.5 3.8  CL 108 106 100* 103  CO2 24 23 29 22   GLUCOSE 141* 101* 132* 146*  BUN 22* 18 14 13   CALCIUM 8.0* 7.9* 7.5* 6.8*  CREATININE 1.77* 0.91 0.74 0.93  GFRNONAA 38* >60 >60 >60  GFRAA 44* >60 >60 >60    LIVER FUNCTION TESTS:  Recent Labs  12/13/15 0454 12/22/15 1234 12/25/15 1736 12/26/15 0636  BILITOT 0.5 0.6 0.5 0.8  AST 22 21 26 17   ALT 39 21 21 16*  ALKPHOS 250* 105 108 87  PROT 5.9* 5.8* 6.6 5.4*  ALBUMIN 2.3* 2.2* 2.6* 2.1*    Assessment and Plan: Intra abd collections s/p drain placement x2 4/11 Continues with increased output from drain. Left superior drain with serosanguinous output, inferior drain with thick purulent drainage.  Cultures pending, but growing Enterococcus faecalis. Febrile overnight.  Continue routine drain care.  IR to follow.  Electronically Signed: Docia Barrier 07/08/2016, 11:21 AM   I spent a total of 15 Minutes at the the patient's bedside AND on the patient's hospital floor or unit, greater than 50% of which was counseling/coordinating care for abd drains x  2

## 2016-07-09 LAB — GLUCOSE, CAPILLARY
GLUCOSE-CAPILLARY: 101 mg/dL — AB (ref 65–99)
GLUCOSE-CAPILLARY: 114 mg/dL — AB (ref 65–99)
GLUCOSE-CAPILLARY: 129 mg/dL — AB (ref 65–99)
GLUCOSE-CAPILLARY: 131 mg/dL — AB (ref 65–99)
Glucose-Capillary: 110 mg/dL — ABNORMAL HIGH (ref 65–99)
Glucose-Capillary: 108 mg/dL — ABNORMAL HIGH (ref 65–99)

## 2016-07-09 MED ORDER — ENOXAPARIN SODIUM 40 MG/0.4ML ~~LOC~~ SOLN
40.0000 mg | SUBCUTANEOUS | Status: DC
  Administered 2016-07-09 – 2016-07-13 (×5): 40 mg via SUBCUTANEOUS
  Filled 2016-07-09 (×5): qty 0.4

## 2016-07-09 NOTE — Care Management Note (Signed)
Case Management Note  Patient Details  Name: FRANSICO SCIANDRA MRN: [id number] Date of Birth: 06/04/1950  Subjective/Objective:                    Action/Plan:  Patient on tube feeds prior to admission , [facility] providing, if patient discharges on same feeds and rate no new order needed.   Nurse to teach drain care. Expected Discharge Date:                  Expected Discharge Plan:  Home/Self Care  In-House Referral:     Discharge planning Services  CM Consult  Post Acute Care Choice:    Choice offered to:     DME Arranged:    DME Agency:     HH Arranged:    HH Agency:     Status of Service:  In process, will continue to follow  If discussed at Long Length of Stay Meetings, dates discussed:    Additional Comments:  Marilu Favre, RN 07/09/2016, 10:10 AM

## 2016-07-09 NOTE — Progress Notes (Signed)
11 Days Post-Op  Subjective: Still with incisional pain No nausea Tolerating tube feeds Temps down No BM last 24 hours  Objective: Vital signs in last 24 hours: Temp:  [98.4 F (36.9 C)-99.6 F (37.6 C)] 98.6 F (37 C) (04/13 0543) Pulse Rate:  [100-117] 101 (04/13 0543) Resp:  [16-18] 18 (04/13 0543) BP: (115-138)/(64-78) 138/78 (04/13 0543) SpO2:  [86 %-95 %] 95 % (04/13 0543) Weight:  [61 kg (134 lb 7.7 oz)] 61 kg (134 lb 7.7 oz) (04/13 0500) Last BM Date: 07/07/16  Intake/Output from previous day: 04/12 0701 - 04/13 0700 In: 1500.8 [P.O.:120; I.V.:1190.8; IV Piggyback:150] Out: 1258 [Urine:900; Drains:358] Intake/Output this shift: No intake/output data recorded.  Exam: Abdomen soft, incisions clean Drains seropurulent  Lab Results:   Recent Labs  07/08/16 0412  WBC 14.4*  HGB 9.4*  HCT 28.3*  PLT 467*   BMET  Recent Labs  07/08/16 0412  NA 133*  K 3.8  CL 103  CO2 22  GLUCOSE 146*  BUN 13  CREATININE 0.93  CALCIUM 6.8*   PT/INR  Recent Labs  07/06/16 1053  LABPROT 14.7  INR 1.15   ABG No results for input(s): PHART, HCO3 in the last 72 hours.  Invalid input(s): PCO2, PO2  Studies/Results: Ct Image Guided Fluid Drain By Catheter  Result Date: 07/07/2016 INDICATION: Status post colostomy takedown and development of intra-abdominal peritoneal fluid collections. CT has demonstrated potential partial retraction of a gastrostomy tube abutting the abdominal wall. EXAM: CT-GUIDED PERCUTANEOUS DRAINAGE PERITONEAL ABSCESS FLUID COLLECTIONS X 2 MEDICATIONS: The patient is currently admitted to the hospital and receiving intravenous antibiotics. The antibiotics were administered within an appropriate time frame prior to the initiation of the procedure. ANESTHESIA/SEDATION: Fentanyl 100 mcg IV; Versed 2.0 mg IV Moderate Sedation Time:  45 minutes. The patient was continuously monitored during the procedure by the interventional radiology nurse under my  direct supervision. COMPLICATIONS: None immediate. PROCEDURE: Informed written consent was obtained from the patient after a thorough discussion of the procedural risks, benefits and alternatives. All questions were addressed. Maximal Sterile Barrier Technique was utilized including caps, mask, sterile gowns, sterile gloves, sterile drape, hand hygiene and skin antiseptic. A timeout was performed prior to the initiation of the procedure. Initial CT was performed in a supine position. The indwelling gastrostomy tube was manipulated by first deflating the retention balloon completely. The tube was then gently advance. The inflation balloon was inflated with 8 mL of saline. The tube was then injected with 20 mL of diluted contrast and additional CT images performed. A left lateral upper abdominal peritoneal fluid collection was localized. After inserting an 18 gauge trocar needle into the collection, aspiration of fluid was performed. A guidewire was advanced into the collection. The tract was dilated over the guidewire and a 10 French percutaneous drain placed. A fluid sample was sent for culture analysis. An anterior lower abdominal fluid collection was then localized. An 18 gauge trocar needle was advanced from a left lateral approach. After confirming needle position, a fluid sample was aspirated. A guidewire was advanced. The tract was dilated and a 12 French percutaneous drainage catheter placed. A fluid sample was sent for culture analysis. Both drains were connected to suction bulbs and secured at their exit sites with Prolene retention sutures. StatLock devices were also utilized. FINDINGS: Initial CT demonstrates further migration of the gastrostomy tube such that the retention balloon lies in the abdominal wall and the tip of the catheter lies barely deep to the abdominal wall  musculature. After deflating the retention balloon, the tube was able to be advanced without difficulty back into the lumen of the  stomach. Contrast injection confirms intraluminal position. The left lateral fluid collection immediately inferior to the spleen yielded thin appearing, bloody fluid. A 10 French drain was placed in this collection. Large anterior fluid collection in the lower abdomen and upper pelvis spanning across the midline and containing an air-fluid level clearly was not bowel and yielded foul-smelling, purulent appearing and blood tinged fluid. A 12 French drain was placed in this collection. IMPRESSION: 1. Advancement of gastrostomy tube back into the lumen of the stomach. Initial CT showed further migration of the gastrostomy tube into the abdominal wall. Intraluminal positioning was confirmed with injection of diluted contrast material. 2. Placement of 2 separate percutaneous drainage catheters. A 10 French drain was placed in a left lateral upper abdominal collection yielding thin, bloody fluid. A sample of this fluid was sent for culture analysis. A 12 French drain was placed in an anterior lower abdominal/pelvic collection yielding thicker, purulent and bloody fluid. A sample of this fluid was sent for separate culture analysis. Electronically Signed   By: Aletta Edouard M.D.   On: 07/07/2016 16:44    Anti-infectives: Anti-infectives    Start     Dose/Rate Route Frequency Ordered Stop   07/06/16 0100  piperacillin-tazobactam (ZOSYN) IVPB 3.375 g     3.375 g 12.5 mL/hr over 240 Minutes Intravenous Every 8 hours 07/05/16 1833     07/05/16 1900  piperacillin-tazobactam (ZOSYN) IVPB 3.375 g     3.375 g 100 mL/hr over 30 Minutes Intravenous  Once 07/05/16 1835 07/05/16 2049   07/05/16 1000  amoxicillin-clavulanate (AUGMENTIN) 875-125 MG per tablet 1 tablet  Status:  Discontinued     1 tablet Oral Every 12 hours 07/05/16 0937 07/05/16 1833   06/28/16 0920  cefoTEtan in Dextrose 5% (CEFOTAN) 2-2.08 GM-% IVPB  Status:  Discontinued    Comments:  Elige Ko   : cabinet override      06/28/16 0920 06/28/16  0924   06/28/16 0830  cefoTEtan (CEFOTAN) 2 g in dextrose 5 % 50 mL IVPB     2 g 100 mL/hr over 30 Minutes Intravenous To Short Stay 06/28/16 0814 06/28/16 0941   06/28/16 0818  cefoTEtan in Dextrose 5% (CEFOTAN) 2-2.08 GM-% IVPB    Comments:  Precious Haws   : cabinet override      06/28/16 0818 06/28/16 2029      Assessment/Plan: s/p Procedure(s): COLOSTOMY TAKEDOWN (N/A)  Continue IV antibiotics Continue tube feeds D/c skin staples  LOS: 11 days    Garrette Caine A 07/09/2016

## 2016-07-10 LAB — CBC
HCT: 29.6 % — ABNORMAL LOW (ref 39.0–52.0)
Hemoglobin: 9.5 g/dL — ABNORMAL LOW (ref 13.0–17.0)
MCH: 29.1 pg (ref 26.0–34.0)
MCHC: 32.1 g/dL (ref 30.0–36.0)
MCV: 90.8 fL (ref 78.0–100.0)
PLATELETS: 582 10*3/uL — AB (ref 150–400)
RBC: 3.26 MIL/uL — AB (ref 4.22–5.81)
RDW: 14.6 % (ref 11.5–15.5)
WBC: 13.3 10*3/uL — AB (ref 4.0–10.5)

## 2016-07-10 LAB — BASIC METABOLIC PANEL
Anion gap: 7 (ref 5–15)
BUN: 13 mg/dL (ref 6–20)
CO2: 28 mmol/L (ref 22–32)
Calcium: 7.1 mg/dL — ABNORMAL LOW (ref 8.9–10.3)
Chloride: 97 mmol/L — ABNORMAL LOW (ref 101–111)
Creatinine, Ser: 0.8 mg/dL (ref 0.61–1.24)
GFR calc Af Amer: >60 mL/min (ref >60)
Glucose, Bld: 162 mg/dL — ABNORMAL HIGH (ref 65–99)
POTASSIUM: 4.4 mmol/L (ref 3.5–5.1)
SODIUM: 132 mmol/L — AB (ref 135–145)

## 2016-07-10 LAB — GLUCOSE, CAPILLARY
GLUCOSE-CAPILLARY: 111 mg/dL — AB (ref 65–99)
GLUCOSE-CAPILLARY: 133 mg/dL — AB (ref 65–99)
GLUCOSE-CAPILLARY: 155 mg/dL — AB (ref 65–99)
Glucose-Capillary: 118 mg/dL — ABNORMAL HIGH (ref 65–99)
Glucose-Capillary: 121 mg/dL — ABNORMAL HIGH (ref 65–99)
Glucose-Capillary: 121 mg/dL — ABNORMAL HIGH (ref 65–99)

## 2016-07-10 MED ORDER — KETOROLAC TROMETHAMINE 15 MG/ML IJ SOLN
15.0000 mg | Freq: Four times a day (QID) | INTRAMUSCULAR | Status: AC
  Administered 2016-07-10 – 2016-07-11 (×4): 15 mg via INTRAVENOUS
  Filled 2016-07-10 (×4): qty 1

## 2016-07-10 MED ORDER — VANCOMYCIN HCL IN DEXTROSE 1-5 GM/200ML-% IV SOLN
1000.0000 mg | Freq: Two times a day (BID) | INTRAVENOUS | Status: DC
  Administered 2016-07-10 – 2016-07-13 (×6): 1000 mg via INTRAVENOUS
  Filled 2016-07-10 (×6): qty 200

## 2016-07-10 MED ORDER — VANCOMYCIN HCL 10 G IV SOLR
1500.0000 mg | Freq: Once | INTRAVENOUS | Status: AC
  Administered 2016-07-10: 1500 mg via INTRAVENOUS
  Filled 2016-07-10: qty 1500

## 2016-07-10 NOTE — Progress Notes (Signed)
Pt had 250cc of loose bowel movement ,brownish in color. 2nd bowel movement this morning, first one not seen by caregivers

## 2016-07-10 NOTE — Progress Notes (Signed)
Pharmacy Antibiotic Note  Miguel [name] is a 66 y.o. male admitted on 06/28/2016 with abdominal abscess.  Pharmacy has been consulted for Vancomycin dosing. Remains on Zosyn per MD.   No CBC available today - WBC was elevated on 4/12.  SCr rising 0.74 >> 0.93. CrCl ~ 70 mL/min.  New fever overnight of 101.8.   E. Faecalis is pan sensitive and should be covered by Zosyn. Abscess culture still pending as possible GNR/anaerobe. Blood cultures drawn overnight - pending.   Plan: Add Vancomycin 1500 mg IV x1 then 1g IV every 12 hours.  Monitor renal function, culture results, and clinical status.  Hopefully can narrow soon.   Height: 5\' 6"  (167.6 cm) Weight: 160 lb 15 oz (73 kg) IBW/kg (Calculated) : 63.8  Temp (24hrs), Avg:100 F (37.8 C), Min:98.7 F (37.1 C), Max:101.8 F (38.8 C)   Recent Labs Lab 07/04/16 0313 07/08/16 0412  WBC 8.2 14.4*  CREATININE 0.74 0.93    Estimated Creatinine Clearance: 70.5 mL/min (by C-G formula based on SCr of 0.93 mg/dL).    Allergies  Allergen Reactions  . No Known Allergies     Antimicrobials this admission: Zosyn 4/10 >>  Vanc 4/14 >>  Dose adjustments this admission:  Microbiology results: 4/10 Upper Abdomen Abscess Cx: Mod E. Faecalis (pan sensitive) 4/10 Lower Abdomen Abscess Cx: Abundant E faecalis (pan sensitive) and GNR/anaerobe (pending) 4/13 Blood cx x2 >>  Thank you for allowing pharmacy to be a part of this patient's care.  Sloan Leiter, PharmD, BCPS Clinical Pharmacist Clinical phone 07/10/2016  Until 3:30 PM -#[phone] After hours, please call #[phone] 07/10/2016 9:39 AM

## 2016-07-10 NOTE — Progress Notes (Signed)
Pt has been taking Dilaudid and Morphine alternating about every 2 hours.  Called Dr. Grandville Silos, order received to try Toradol, will continue to monitor pain level.

## 2016-07-10 NOTE — Progress Notes (Signed)
12 Days Post-Op  Subjective: Stable and alert Still has abdominal pain.  No worse and perhaps a little better. Tolerating tube feeds at 55 mL/h. Tolerating clear liquids orally but does not want to advance. 2 stools yesterday  The drainage cultures growing enterococcus faecalis.  Sensitive to ampicillin, vancomycin, gentamicin synergy. On Zosyn    Objective: Vital signs in last 24 hours: Temp:  [98.7 F (37.1 C)-101.8 F (38.8 C)] 98.7 F (37.1 C) (04/14 0542) Pulse Rate:  [95-109] 95 (04/14 0542) Resp:  [17] 17 (04/14 0542) BP: (125-137)/(67-74) 137/74 (04/14 0542) SpO2:  [92 %-94 %] 93 % (04/14 0542) Weight:  [73 kg (160 lb 15 oz)] 73 kg (160 lb 15 oz) (04/14 0542) Last BM Date: 07/09/16 (per pt report)  Intake/Output from previous day: 04/13 0701 - 04/14 0700 In: 2288.3 [P.O.:386; NG/GT:1712.3; IV Piggyback:150] Out: 890 [Urine:750; Drains:140] Intake/Output this shift: No intake/output data recorded.  General appearance: Alert and cooperative.  Mental status normal.  Deconditioned but not toxic at all. GI: Soft.  A little bit tympanitic in the upper abdomen.  Left lower quadrant drain purulent.  Left upper quadrant drain clear and serous.  Wound is clean without sign of any wound infection.  Lab Results:  Results for orders placed or performed during the hospital encounter of 06/28/16 (from the past 24 hour(s))  Glucose, capillary     Status: Abnormal   Collection Time: 07/09/16 11:35 AM  Result Value Ref Range   Glucose-Capillary 129 (H) 65 - 99 mg/dL  Glucose, capillary     Status: Abnormal   Collection Time: 07/09/16  4:39 PM  Result Value Ref Range   Glucose-Capillary 110 (H) 65 - 99 mg/dL  Glucose, capillary     Status: Abnormal   Collection Time: 07/09/16  8:00 PM  Result Value Ref Range   Glucose-Capillary 108 (H) 65 - 99 mg/dL  Glucose, capillary     Status: Abnormal   Collection Time: 07/10/16 12:46 AM  Result Value Ref Range   Glucose-Capillary 155  (H) 65 - 99 mg/dL  Glucose, capillary     Status: Abnormal   Collection Time: 07/10/16  5:38 AM  Result Value Ref Range   Glucose-Capillary 133 (H) 65 - 99 mg/dL  Glucose, capillary     Status: Abnormal   Collection Time: 07/10/16  7:21 AM  Result Value Ref Range   Glucose-Capillary 121 (H) 65 - 99 mg/dL     Studies/Results: No results found.  . enoxaparin (LOVENOX) injection  40 mg Subcutaneous Q24H  . free water  75 mL Per Tube QID  . piperacillin-tazobactam (ZOSYN)  IV  3.375 g Intravenous Q8H  . tamsulosin  0.4 mg Oral Daily  . vancomycin  1,500 mg Intravenous Once   Followed by  . vancomycin  1,000 mg Intravenous Q12H     Assessment/Plan: s/p Procedure(s): COLOSTOMY TAKEDOWN  POD #12 - colostomy takedown and ventral hernia repair Wound healing well Gastrostomy feeding tube replaced and seems to be tolerating that well They are clear liquids Ileus better  Postop anastomotic bleed.  Resolved.  Check labs today.  Intra-abdominal abscess.  Question controlled anastomotic leak.  Continue Zosyn.  Add vancomycin   @PROBHOSP @  LOS: 12 days    Garren Greenman M 07/10/2016  . .prob

## 2016-07-11 ENCOUNTER — Inpatient Hospital Stay (HOSPITAL_COMMUNITY): Payer: 59

## 2016-07-11 LAB — GLUCOSE, CAPILLARY
GLUCOSE-CAPILLARY: 128 mg/dL — AB (ref 65–99)
GLUCOSE-CAPILLARY: 104 mg/dL — AB (ref 65–99)
Glucose-Capillary: 116 mg/dL — ABNORMAL HIGH (ref 65–99)
Glucose-Capillary: 134 mg/dL — ABNORMAL HIGH (ref 65–99)
Glucose-Capillary: 115 mg/dL — ABNORMAL HIGH (ref 65–99)

## 2016-07-11 MED ORDER — MORPHINE SULFATE (PF) 2 MG/ML IV SOLN
1.0000 mg | INTRAVENOUS | Status: DC | PRN
  Administered 2016-07-11 – 2016-07-13 (×7): 1 mg via INTRAVENOUS
  Filled 2016-07-11 (×7): qty 1

## 2016-07-11 MED ORDER — HYDROCODONE-ACETAMINOPHEN 5-325 MG PO TABS
1.0000 | ORAL_TABLET | ORAL | Status: DC | PRN
  Administered 2016-07-11 – 2016-07-12 (×4): 2 via ORAL
  Administered 2016-07-12: 1 via ORAL
  Administered 2016-07-12: 2 via ORAL
  Administered 2016-07-13: 1 via ORAL
  Administered 2016-07-13: 2 via ORAL
  Filled 2016-07-11: qty 2
  Filled 2016-07-11 (×2): qty 1
  Filled 2016-07-11 (×5): qty 2
  Filled 2016-07-11: qty 1

## 2016-07-11 NOTE — Progress Notes (Signed)
Call placed to Dr. Pascal Lux, IR, concerning drain and it not being able to keep a charge (new bulb placed already).

## 2016-07-11 NOTE — Progress Notes (Signed)
Spoke with Dr. Pascal Lux about the drain, he said to keep it to the bulb for now and call IR in the am to have one of the PAs round on the pt and see what is going on.

## 2016-07-11 NOTE — Progress Notes (Signed)
13 Days Post-Op  Subjective:  Stable and alert.  Afebrile.  Heart rate 98. One episode of emesis yesterday Denies nausea or pain today TF's  at 55 mL per hour  Good urine output Total drainage 129 mL.   LLQ drainage purulent.  LUQ drainage clear. Cultures of abdominal abscess dated 410 going Enterococcus faecalis.  On Zosyn and vancomycin for this.   Objective: Vital signs in last 24 hours: Temp:  [98.3 F (36.8 C)-99.1 F (37.3 C)] 98.3 F (36.8 C) (04/15 0357) Pulse Rate:  [93-104] 98 (04/15 0357) Resp:  [16-19] 19 (04/15 0357) BP: (140-143)/(71-76) 140/75 (04/15 0357) SpO2:  [93 %-94 %] 94 % (04/15 0357) Last BM Date: 07/10/16  Intake/Output from previous day: 04/14 0701 - 04/15 0700 In: 1778.8 [P.O.:180; NG/GT:1308.8; IV Piggyback:250] Out: 366 [Urine:770; Drains:129] Intake/Output this shift: No intake/output data recorded.   General appearance: Alert and cooperative.  Mental status normal.  Deconditioned but not toxic at all. GI: Soft.  A little bit tympanitic in the upper abdomen.  Left lower quadrant drain purulent.  Left upper quadrant drain clear and serous.  Wound is clean without sign of any wound infection.  Midline wound staples are out.  LLQ colostomy site staples remain.  Wounds look clean.   Lab Results:  Results for orders placed or performed during the hospital encounter of 06/28/16 (from the past 24 hour(s))  CBC     Status: Abnormal   Collection Time: 07/10/16  9:45 AM  Result Value Ref Range   WBC 13.3 (H) 4.0 - 10.5 K/uL   RBC 3.26 (L) 4.22 - 5.81 MIL/uL   Hemoglobin 9.5 (L) 13.0 - 17.0 g/dL   HCT 29.6 (L) 39.0 - 52.0 %   MCV 90.8 78.0 - 100.0 fL   MCH 29.1 26.0 - 34.0 pg   MCHC 32.1 30.0 - 36.0 g/dL   RDW 14.6 11.5 - 15.5 %   Platelets 582 (H) 150 - 400 K/uL  Basic metabolic panel     Status: Abnormal   Collection Time: 07/10/16  9:45 AM  Result Value Ref Range   Sodium 132 (L) 135 - 145 mmol/L   Potassium 4.4 3.5 - 5.1 mmol/L   Chloride  97 (L) 101 - 111 mmol/L   CO2 28 22 - 32 mmol/L   Glucose, Bld 162 (H) 65 - 99 mg/dL   BUN 13 6 - 20 mg/dL   Creatinine, Ser 0.80 0.61 - 1.24 mg/dL   Calcium 7.1 (L) 8.9 - 10.3 mg/dL   GFR calc non Af Amer >60 >60 mL/min   GFR calc Af Amer >60 >60 mL/min   Anion gap 7 5 - 15  Glucose, capillary     Status: Abnormal   Collection Time: 07/10/16 11:54 AM  Result Value Ref Range   Glucose-Capillary 111 (H) 65 - 99 mg/dL  Glucose, capillary     Status: Abnormal   Collection Time: 07/10/16  7:36 PM  Result Value Ref Range   Glucose-Capillary 118 (H) 65 - 99 mg/dL  Glucose, capillary     Status: Abnormal   Collection Time: 07/10/16 11:58 PM  Result Value Ref Range   Glucose-Capillary 121 (H) 65 - 99 mg/dL  Glucose, capillary     Status: Abnormal   Collection Time: 07/11/16  3:57 AM  Result Value Ref Range   Glucose-Capillary 128 (H) 65 - 99 mg/dL  Glucose, capillary     Status: Abnormal   Collection Time: 07/11/16  7:42 AM  Result Value Ref  Range   Glucose-Capillary 116 (H) 65 - 99 mg/dL   Comment 1 Notify RN      Studies/Results: No results found.  . enoxaparin (LOVENOX) injection  40 mg Subcutaneous Q24H  . free water  75 mL Per Tube QID  . ketorolac  15 mg Intravenous Q6H  . piperacillin-tazobactam (ZOSYN)  IV  3.375 g Intravenous Q8H  . tamsulosin  0.4 mg Oral Daily  . vancomycin  1,000 mg Intravenous Q12H     Assessment/Plan: s/p Procedure(s): COLOSTOMY TAKEDOWN     POD #13 - colostomy takedown and ventral hernia repair Wound healing well Gastrostomy feeding tube replaced and seems to be tolerating that well On clear liquids p.o. Concerned about possible gastric distention Abdominal x-ray today Narcotics decreased May need Reglan May need to adjust tube feeds  Postop anastomotic bleed.  Resolved.  Check labs today.  Intra-abdominal abscess.  Question controlled anastomotic leak.  Continue Zosyn.  Add vancomycin   Continue antibiotics and  drainage -Probable follow-up CT scan next week to assess adequacy of drainage  @PROBHOSP @  LOS: 13 days    Florice Hindle M 07/11/2016  . .prob

## 2016-07-12 LAB — GLUCOSE, CAPILLARY
GLUCOSE-CAPILLARY: 115 mg/dL — AB (ref 65–99)
GLUCOSE-CAPILLARY: 113 mg/dL — AB (ref 65–99)
Glucose-Capillary: 114 mg/dL — ABNORMAL HIGH (ref 65–99)
Glucose-Capillary: 118 mg/dL — ABNORMAL HIGH (ref 65–99)
Glucose-Capillary: 119 mg/dL — ABNORMAL HIGH (ref 65–99)
Glucose-Capillary: 122 mg/dL — ABNORMAL HIGH (ref 65–99)

## 2016-07-12 NOTE — Consult Note (Signed)
   [facility] CM Inpatient Consult   07/12/2016  Miguel [name] 1951/01/17 [id number]    Came to visit Miguel [name] on behalf of [place] [employer] program for [place] employees/dependents with [facility] insurance. He was in good spirits with a several visitors. Appreciative of visit. Will continue to follow along.    [name], MSN-Ed, RN,BSN [facility] Liaison [phone]

## 2016-07-12 NOTE — Care Management Note (Addendum)
Case Management Note  Patient Details  Name: Miguel [name] MRN: [id number] Date of Birth: [birth year]  Subjective/Objective:                    Action/Plan: Received HHRN order, referral given to [facility] Spoke with patient and wife at bedside. Confirmed with them patient is on his home tube feedings .   Discussed drain flushes, bedside nurse will begin teaching. Both would like a HHRN through [facility] also to continue teaching and post op checks. Spoke with [name] at Dr Trevor Mace office regarding [facility], awaiting call back. Expected Discharge Date:                  Expected Discharge Plan:  [place]  In-House Referral:     Discharge planning Services  CM Consult  Post Acute Care Choice:    Choice offered to:  Patient, Spouse  DME Arranged:    DME Agency:     HH Arranged:  RN [place] Agency:     Status of Service:  In process, will continue to follow  If discussed at Long Length of Stay Meetings, dates discussed:    Additional Comments:  Marilu Favre, RN 07/12/2016, 10:33 AM

## 2016-07-12 NOTE — Progress Notes (Signed)
14 Days Post-Op  Subjective: Multiple BM's this weekend. No fever over the weekend Pain less  Objective: Vital signs in last 24 hours: Temp:  [98.1 F (36.7 C)-98.2 F (36.8 C)] 98.1 F (36.7 C) (04/15 1937) Pulse Rate:  [88-90] 88 (04/15 1937) Resp:  [19] 19 (04/15 1937) BP: (142)/(72-75) 142/72 (04/15 1937) SpO2:  [94 %] 94 % (04/15 1937) Weight:  [72 kg (158 lb 11.7 oz)] 72 kg (158 lb 11.7 oz) (04/16 0500) Last BM Date: 07/11/16  Intake/Output from previous day: 04/15 0701 - 04/16 0700 In: 1984.7 [P.O.:150; IO/EV:0350.0; IV Piggyback:550] Out: 805 [Urine:650; Drains:155] Intake/Output this shift: No intake/output data recorded.  Exam: Looks much more comfortable Lungs clear CV RRR Abdomen soft, almost non tender, one of the drains is not holding suction  Lab Results:   Recent Labs  07/10/16 0945  WBC 13.3*  HGB 9.5*  HCT 29.6*  PLT 582*   BMET  Recent Labs  07/10/16 0945  NA 132*  K 4.4  CL 97*  CO2 28  GLUCOSE 162*  BUN 13  CREATININE 0.80  CALCIUM 7.1*   PT/INR No results for input(s): LABPROT, INR in the last 72 hours. ABG No results for input(s): PHART, HCO3 in the last 72 hours.  Invalid input(s): PCO2, PO2  Studies/Results: Dg Abd Portable 1v  Result Date: 07/11/2016 CLINICAL DATA:  Abdominal distension EXAM: PORTABLE ABDOMEN - 1 VIEW COMPARISON:  07/06/16 FINDINGS: Scattered large and small bowel gas is noted. Drainage catheter is noted in the mid abdomen inferiorly. Gastrostomy catheter is noted in the left upper quadrant. Postsurgical changes are identified. No obstructive changes are noted. IMPRESSION: Status post percutaneous drainage.  No obstructive changes are seen. Electronically Signed   By: Inez Catalina M.D.   On: 07/11/2016 16:45    Anti-infectives: Anti-infectives    Start     Dose/Rate Route Frequency Ordered Stop   07/10/16 2200  vancomycin (VANCOCIN) IVPB 1000 mg/200 mL premix     1,000 mg 200 mL/hr over 60 Minutes  Intravenous Every 12 hours 07/10/16 0955     07/10/16 1000  vancomycin (VANCOCIN) 1,500 mg in sodium chloride 0.9 % 500 mL IVPB     1,500 mg 250 mL/hr over 120 Minutes Intravenous  Once 07/10/16 0955 07/10/16 1215   07/06/16 0100  piperacillin-tazobactam (ZOSYN) IVPB 3.375 g     3.375 g 12.5 mL/hr over 240 Minutes Intravenous Every 8 hours 07/05/16 1833     07/05/16 1900  piperacillin-tazobactam (ZOSYN) IVPB 3.375 g     3.375 g 100 mL/hr over 30 Minutes Intravenous  Once 07/05/16 1835 07/05/16 2049   07/05/16 1000  amoxicillin-clavulanate (AUGMENTIN) 875-125 MG per tablet 1 tablet  Status:  Discontinued     1 tablet Oral Every 12 hours 07/05/16 0937 07/05/16 1833   06/28/16 0920  cefoTEtan in Dextrose 5% (CEFOTAN) 2-2.08 GM-% IVPB  Status:  Discontinued    Comments:  Elige Ko   : cabinet override      06/28/16 0920 06/28/16 0924   06/28/16 0830  cefoTEtan (CEFOTAN) 2 g in dextrose 5 % 50 mL IVPB     2 g 100 mL/hr over 30 Minutes Intravenous To Short Stay 06/28/16 0814 06/28/16 0941   06/28/16 0818  cefoTEtan in Dextrose 5% (CEFOTAN) 2-2.08 GM-% IVPB    Comments:  Precious Haws   : cabinet override      06/28/16 0818 06/28/16 2029      Assessment/Plan: s/p Procedure(s): COLOSTOMY TAKEDOWN (N/A)  Continue current  care Hopefully home in the next few days with drains Repeat CT per IR  LOS: 14 days    Chauntae Hults A 07/12/2016

## 2016-07-12 NOTE — Care Management Note (Signed)
Case Management Note  Patient Details  Name: Miguel [name] MRN: [id number] Date of Birth: 19-Sep-1950  Subjective/Objective:                    Action/Plan:  Patient currently on Home TF regimen is Osmolite 1.5 @ 55 ml/hr via PEG over 18 hours . If TF regimen does not change, do not need new TF orders.  IF MD wants [facility] for drain care will need order and face to face . Expected Discharge Date:                  Expected Discharge Plan:  Home/Self Care  In-House Referral:     Discharge planning Services  CM Consult  Post Acute Care Choice:    Choice offered to:     DME Arranged:    DME Agency:     HH Arranged:    HH Agency:     Status of Service:  In process, will continue to follow  If discussed at Long Length of Stay Meetings, dates discussed:    Additional Comments:  Marilu Favre, RN 07/12/2016, 8:14 AM

## 2016-07-12 NOTE — Progress Notes (Signed)
Referring Physician(s): Dr Evlyn Courier  Supervising Physician: Jacqulynn Cadet  Patient Status:  [facility] - In-pt  Chief Complaint:  Intra abdominal abscess drain x 2 placed 4/11  Subjective:  Up in chair Better daily OP still significant from both drains  Allergies: No known allergies  Medications: Prior to Admission medications   Medication Sig Start Date End Date Taking? Authorizing Provider  acetaminophen (TYLENOL) 500 MG tablet Take 1 tablet (500 mg total) by mouth every 8 (eight) hours as needed for mild pain (for pain). 12/05/15  Yes Darci Current Simaan, PA-C  aspirin EC 81 MG tablet Take 81 mg by mouth daily after supper.    Yes Historical Provider, MD  famotidine (PEPCID) 20 MG tablet Take 1 tablet (20 mg total) by mouth 2 (two) times daily. 11/09/15  Yes Elwin Mocha, MD  ferrous sulfate 325 (65 FE) MG tablet Take 325 mg by mouth daily.   Yes Historical Provider, MD  Multiple Vitamin (MULTIVITAMIN WITH MINERALS) TABS tablet Take 1 tablet by mouth daily.   Yes Historical Provider, MD  Nutritional Supplements (FEEDING SUPPLEMENT, OSMOLITE 1.5 CAL,) LIQD Place 1,000 mLs into feeding tube continuous.   Yes Historical Provider, MD  Omega-3 Fatty Acids (FISH OIL) 1000 MG CPDR Take 1,000 mg by mouth daily.   Yes Historical Provider, MD  Probiotic Product (ALIGN) 4 MG CAPS Take 1 capsule by mouth daily.   Yes Historical Provider, MD  tamsulosin (FLOMAX) 0.4 MG CAPS capsule Take 1 capsule (0.4 mg total) by mouth daily. 01/04/16  Yes Fanny Skates, MD  traMADol (ULTRAM) 50 MG tablet Take 50 mg by mouth every 6 (six) hours as needed for moderate pain.  05/21/16  Yes Historical Provider, MD  zolpidem (AMBIEN) 10 MG tablet Take 5-10 mg by mouth at bedtime as needed and may repeat dose one time if needed for sleep.    Yes Historical Provider, MD  diclofenac sodium (VOLTAREN) 1 % GEL Apply 1 application topically 2 (two) times daily as needed (back and shoulder pain).    Historical  Provider, MD     Vital Signs: BP (!) 142/72 (BP Location: Right Arm)   Pulse 88   Temp 98.1 F (36.7 C) (Oral)   Resp 19   Ht 5\' 6"  (1.676 m)   Wt 158 lb 11.7 oz (72 kg)   SpO2 94%   BMI 25.62 kg/m   Physical Exam  Constitutional: He is oriented to person, place, and time.  Abdominal: Soft. Bowel sounds are normal. There is no tenderness.  Musculoskeletal: Normal range of motion.  Neurological: He is alert and oriented to person, place, and time.  Skin: Skin is warm and dry.  Sites clean and dry No bleeding NT  OP #1: blood tinged; 35 cc yesterday Enterococcus OP #2: milky brown color; 120 cc yesterday Enterococcus  Both drains functioning well  Wbc 13.3; afeb   Nursing note and vitals reviewed.   Imaging: Dg Abd Portable 1v  Result Date: 07/11/2016 CLINICAL DATA:  Abdominal distension EXAM: PORTABLE ABDOMEN - 1 VIEW COMPARISON:  07/06/16 FINDINGS: Scattered large and small bowel gas is noted. Drainage catheter is noted in the mid abdomen inferiorly. Gastrostomy catheter is noted in the left upper quadrant. Postsurgical changes are identified. No obstructive changes are noted. IMPRESSION: Status post percutaneous drainage.  No obstructive changes are seen. Electronically Signed   By: Inez Catalina M.D.   On: 07/11/2016 16:45    Labs:  CBC:  Recent Labs  07/03/16 0430  07/04/16 0313 07/08/16 0412 07/10/16 0945  WBC 8.3 8.2 14.4* 13.3*  HGB 10.6* 10.1* 9.4* 9.5*  HCT 32.7* 30.2* 28.3* 29.6*  PLT 227 268 467* 582*    COAGS:  Recent Labs  10/29/15 1851 10/30/15 1245 10/30/15 1636 11/06/15 0453 06/30/16 1605 07/06/16 1053  INR 1.28 1.42 1.34 1.38 1.29 1.15  APTT 29 27 25   --  32  --     BMP:  Recent Labs  07/01/16 0313 07/04/16 0313 07/08/16 0412 07/10/16 0945  NA 138 136 133* 132*  K 4.6 3.5 3.8 4.4  CL 106 100* 103 97*  CO2 23 29 22 28   GLUCOSE 101* 132* 146* 162*  BUN 18 14 13 13   CALCIUM 7.9* 7.5* 6.8* 7.1*  CREATININE 0.91 0.74  0.93 0.80  GFRNONAA >60 >60 >60 >60  GFRAA >60 >60 >60 >60    LIVER FUNCTION TESTS:  Recent Labs  12/13/15 0454 12/22/15 1234 12/25/15 1736 12/26/15 0636  BILITOT 0.5 0.6 0.5 0.8  AST 22 21 26 17   ALT 39 21 21 16*  ALKPHOS 250* 105 108 87  PROT 5.9* 5.8* 6.6 5.4*  ALBUMIN 2.3* 2.2* 2.6* 2.1*    Assessment and Plan:  Intra absd abscess drains intact Continue drains for now Output significant for both Would rec CT scan when OP 10 or less/24 hrs Drain clinic can follow as outpt. Order in for same Pt will hear from scheduled for time and date  Electronically Signed: Toron Bowring A 07/12/2016, 9:57 AM   I spent a total of 15 Minutes at the the patient's bedside AND on the patient's hospital floor or unit, greater than 50% of which was counseling/coordinating care for abscess drains

## 2016-07-13 ENCOUNTER — Other Ambulatory Visit: Payer: Self-pay | Admitting: Surgery

## 2016-07-13 DIAGNOSIS — K651 Peritoneal abscess: Secondary | ICD-10-CM

## 2016-07-13 LAB — GLUCOSE, CAPILLARY
GLUCOSE-CAPILLARY: 104 mg/dL — AB (ref 65–99)
GLUCOSE-CAPILLARY: 108 mg/dL — AB (ref 65–99)
GLUCOSE-CAPILLARY: 113 mg/dL — AB (ref 65–99)
Glucose-Capillary: 105 mg/dL — ABNORMAL HIGH (ref 65–99)

## 2016-07-13 LAB — AEROBIC/ANAEROBIC CULTURE (SURGICAL/DEEP WOUND)

## 2016-07-13 LAB — AEROBIC/ANAEROBIC CULTURE W GRAM STAIN (SURGICAL/DEEP WOUND)

## 2016-07-13 MED ORDER — HYDROCODONE-ACETAMINOPHEN 5-325 MG PO TABS: 1.0000 | ORAL_TABLET | ORAL | 0 refills | Status: DC | PRN

## 2016-07-13 MED ORDER — AMOXICILLIN-POT CLAVULANATE 875-125 MG PO TABS: 1.0000 | ORAL_TABLET | Freq: Two times a day (BID) | ORAL | 0 refills | Status: AC

## 2016-07-13 MED FILL — NORMAL SALINE FLUSH SYRINGE: 0.9 | 15 days supply | Qty: 300 | Fill #0

## 2016-07-13 NOTE — Discharge Instructions (Signed)
[facility]      [facility] [phone]  OPEN ABDOMINAL SURGERY: POST OP INSTRUCTIONS  Always review your discharge instruction sheet given to you by the facility where your surgery was performed.  IF YOU HAVE DISABILITY OR FAMILY LEAVE FORMS, YOU MUST BRING THEM TO THE OFFICE FOR PROCESSING.  PLEASE DO NOT GIVE THEM TO YOUR DOCTOR.  1. A prescription for pain medication may be given to you upon discharge.  Take your pain medication as prescribed, if needed.  If narcotic pain medicine is not needed, then you may take acetaminophen (Tylenol) or ibuprofen (Advil) as needed. 2. Take your usually prescribed medications unless otherwise directed. 3. If you need a refill on your pain medication, please contact your pharmacy. They will contact our office to request authorization.  Prescriptions will not be filled after 5pm or on week-ends. 4. You should follow a light diet the first few days after arrival home, such as soup and crackers, pudding, etc.unless your doctor has advised otherwise. A high-fiber, low fat diet can be resumed as tolerated.   Be sure to include lots of fluids daily. Most patients will experience some swelling and bruising on the chest and neck area.  Ice packs will help.  Swelling and bruising can take several days to resolve 5. Most patients will experience some swelling and bruising in the area of the incision. Ice pack will help. Swelling and bruising can take several days to resolve..  6. It is common to experience some constipation if taking pain medication after surgery.  Increasing fluid intake and taking a stool softener will usually help or prevent this problem from occurring.  A mild laxative (Milk of Magnesia or Miralax) should be taken according to package directions if there are no bowel movements after 48 hours. 7.  You may have steri-strips (small skin tapes) in place directly over the incision.  These strips should be left on the skin for 7-10 days.  If your  surgeon used skin glue on the incision, you may shower in 24 hours.  The glue will flake off over the next 2-3 weeks.  Any sutures or staples will be removed at the office during your follow-up visit. You may find that a light gauze bandage over your incision may keep your staples from being rubbed or pulled. You may shower and replace the bandage daily. 8. ACTIVITIES:  You may resume regular (light) daily activities beginning the next day--such as daily self-care, walking, climbing stairs--gradually increasing activities as tolerated.  You may have sexual intercourse when it is comfortable.  Refrain from any heavy lifting or straining until approved by your doctor. a. You may drive when you no longer are taking prescription pain medication, you can comfortably wear a seatbelt, and you can safely maneuver your car and apply brakes b. Return to Work: ___________________________________ 9. You should see your doctor in the office for a follow-up appointment approximately two weeks after your surgery.  Make sure that you call for this appointment within a day or two after you arrive home to insure a convenient appointment time. OTHER INSTRUCTIONS:  _____________________________________________________________ _____________________________________________________________  WHEN TO CALL YOUR DOCTOR: 1. Fever over 101.0 2. Inability to urinate 3. Nausea and/or vomiting 4. Extreme swelling or bruising 5. Continued bleeding from incision. 6. Increased pain, redness, or drainage from the incision. 7. Difficulty swallowing or breathing 8. Muscle cramping or spasms. 9. Numbness or tingling in hands or feet or around lips.  The clinic staff is available to   answer your questions during regular business hours.  Please don't hesitate to call and ask to speak to one of the nurses if you have concerns.  For further questions, please visit [email or web address]   

## 2016-07-13 NOTE — Progress Notes (Signed)
15 Days Post-Op  Subjective: He has been afebrile for several days now No complaints Having BM's and tolerating tube feeds  Objective: Vital signs in last 24 hours: Temp:  [98.1 F (36.7 C)-99.2 F (37.3 C)] 98.1 F (36.7 C) (04/17 0450) Pulse Rate:  [96-102] 99 (04/17 0450) Resp:  [17-18] 17 (04/17 0450) BP: (136-149)/(74-78) 141/78 (04/17 0450) SpO2:  [96 %-97 %] 96 % (04/17 0450) Weight:  [69.9 kg (154 lb 1.6 oz)] 69.9 kg (154 lb 1.6 oz) (04/17 0450) Last BM Date: 07/12/16  Intake/Output from previous day: 04/16 0701 - 04/17 0700 In: 2442.5 [P.O.:180; GD/JM:4268.3; IV Piggyback:550] Out: 4196 [Urine:1200; Drains:220] Intake/Output this shift: Total I/O In: 1280 [P.O.:180; Other:20; NG/GT:830; IV Piggyback:250] Out: 1110 [Urine:950; Drains:160]  Exam: Looks good Abdomen soft, drains clearing  Lab Results:   Recent Labs  07/10/16 0945  WBC 13.3*  HGB 9.5*  HCT 29.6*  PLT 582*   BMET  Recent Labs  07/10/16 0945  NA 132*  K 4.4  CL 97*  CO2 28  GLUCOSE 162*  BUN 13  CREATININE 0.80  CALCIUM 7.1*   PT/INR No results for input(s): LABPROT, INR in the last 72 hours. ABG No results for input(s): PHART, HCO3 in the last 72 hours.  Invalid input(s): PCO2, PO2  Studies/Results: Dg Abd Portable 1v  Result Date: 07/11/2016 CLINICAL DATA:  Abdominal distension EXAM: PORTABLE ABDOMEN - 1 VIEW COMPARISON:  07/06/16 FINDINGS: Scattered large and small bowel gas is noted. Drainage catheter is noted in the mid abdomen inferiorly. Gastrostomy catheter is noted in the left upper quadrant. Postsurgical changes are identified. No obstructive changes are noted. IMPRESSION: Status post percutaneous drainage.  No obstructive changes are seen. Electronically Signed   By: Inez Catalina M.D.   On: 07/11/2016 16:45    Anti-infectives: Anti-infectives    Start     Dose/Rate Route Frequency Ordered Stop   07/10/16 2200  vancomycin (VANCOCIN) IVPB 1000 mg/200 mL premix     1,000 mg 200 mL/hr over 60 Minutes Intravenous Every 12 hours 07/10/16 0955     07/10/16 1000  vancomycin (VANCOCIN) 1,500 mg in sodium chloride 0.9 % 500 mL IVPB     1,500 mg 250 mL/hr over 120 Minutes Intravenous  Once 07/10/16 0955 07/10/16 1215   07/06/16 0100  piperacillin-tazobactam (ZOSYN) IVPB 3.375 g     3.375 g 12.5 mL/hr over 240 Minutes Intravenous Every 8 hours 07/05/16 1833     07/05/16 1900  piperacillin-tazobactam (ZOSYN) IVPB 3.375 g     3.375 g 100 mL/hr over 30 Minutes Intravenous  Once 07/05/16 1835 07/05/16 2049   07/05/16 1000  amoxicillin-clavulanate (AUGMENTIN) 875-125 MG per tablet 1 tablet  Status:  Discontinued     1 tablet Oral Every 12 hours 07/05/16 0937 07/05/16 1833   06/28/16 0920  cefoTEtan in Dextrose 5% (CEFOTAN) 2-2.08 GM-% IVPB  Status:  Discontinued    Comments:  Elige Ko   : cabinet override      06/28/16 0920 06/28/16 0924   06/28/16 0830  cefoTEtan (CEFOTAN) 2 g in dextrose 5 % 50 mL IVPB     2 g 100 mL/hr over 30 Minutes Intravenous To Short Stay 06/28/16 0814 06/28/16 0941   06/28/16 0818  cefoTEtan in Dextrose 5% (CEFOTAN) 2-2.08 GM-% IVPB    Comments:  Precious Haws   : cabinet override      06/28/16 0818 06/28/16 2029      Assessment/Plan: s/p Procedure(s): COLOSTOMY TAKEDOWN (N/A)  Plan possible d/c  today after lunch Will need HH for drain care Will need f/u CT scan/ f/u IR drain clinic as an outpt  LOS: 15 days    Miguel [name] A 07/13/2016

## 2016-07-13 NOTE — Progress Notes (Signed)
Referring Physician(s): Dr Evlyn Courier  Supervising Physician: Jacqulynn Cadet  Patient Status:  [facility] - In-pt  Chief Complaint:  Intra abdominal abscess drain x 2 placed 4/11  Subjective: Alert and sipping clear liquids.  Drain #1 with 75 mL output yesterday Drain #2 with 145 mL output yesterday  Allergies: No known allergies  Medications: Prior to Admission medications   Medication Sig Start Date End Date Taking? Authorizing Provider  acetaminophen (TYLENOL) 500 MG tablet Take 1 tablet (500 mg total) by mouth every 8 (eight) hours as needed for mild pain (for pain). 12/05/15  Yes Darci Current Simaan, PA-C  aspirin EC 81 MG tablet Take 81 mg by mouth daily after supper.    Yes Historical Provider, MD  famotidine (PEPCID) 20 MG tablet Take 1 tablet (20 mg total) by mouth 2 (two) times daily. 11/09/15  Yes Elwin Mocha, MD  ferrous sulfate 325 (65 FE) MG tablet Take 325 mg by mouth daily.   Yes Historical Provider, MD  Multiple Vitamin (MULTIVITAMIN WITH MINERALS) TABS tablet Take 1 tablet by mouth daily.   Yes Historical Provider, MD  Nutritional Supplements (FEEDING SUPPLEMENT, OSMOLITE 1.5 CAL,) LIQD Place 1,000 mLs into feeding tube continuous.   Yes Historical Provider, MD  Omega-3 Fatty Acids (FISH OIL) 1000 MG CPDR Take 1,000 mg by mouth daily.   Yes Historical Provider, MD  Probiotic Product (ALIGN) 4 MG CAPS Take 1 capsule by mouth daily.   Yes Historical Provider, MD  tamsulosin (FLOMAX) 0.4 MG CAPS capsule Take 1 capsule (0.4 mg total) by mouth daily. 01/04/16  Yes Fanny Skates, MD  traMADol (ULTRAM) 50 MG tablet Take 50 mg by mouth every 6 (six) hours as needed for moderate pain.  05/21/16  Yes Historical Provider, MD  zolpidem (AMBIEN) 10 MG tablet Take 5-10 mg by mouth at bedtime as needed and may repeat dose one time if needed for sleep.    Yes Historical Provider, MD  diclofenac sodium (VOLTAREN) 1 % GEL Apply 1 application topically 2 (two) times daily as needed  (back and shoulder pain).    Historical Provider, MD     Vital Signs: BP (!) 141/78 (BP Location: Right Arm)   Pulse 99   Temp 98.1 F (36.7 C) (Oral)   Resp 17   Ht 5\' 6"  (1.676 m)   Wt 154 lb 1.6 oz (69.9 kg)   SpO2 96%   BMI 24.87 kg/m   Physical Exam  Constitutional: He is oriented to person, place, and time.  Abdominal: Soft. Bowel sounds are normal. There is no tenderness.  Musculoskeletal: Normal range of motion.  Neurological: He is alert and oriented to person, place, and time.  Skin: Skin is warm and dry.  Insertion sites c/d/I.  Non tender.  Both with significant output.    Nursing note and vitals reviewed.   Imaging: Dg Abd Portable 1v  Result Date: 07/11/2016 CLINICAL DATA:  Abdominal distension EXAM: PORTABLE ABDOMEN - 1 VIEW COMPARISON:  07/06/16 FINDINGS: Scattered large and small bowel gas is noted. Drainage catheter is noted in the mid abdomen inferiorly. Gastrostomy catheter is noted in the left upper quadrant. Postsurgical changes are identified. No obstructive changes are noted. IMPRESSION: Status post percutaneous drainage.  No obstructive changes are seen. Electronically Signed   By: Inez Catalina M.D.   On: 07/11/2016 16:45    Labs:  CBC:  Recent Labs  07/03/16 0430 07/04/16 0313 07/08/16 0412 07/10/16 0945  WBC 8.3 8.2 14.4* 13.3*  HGB  10.6* 10.1* 9.4* 9.5*  HCT 32.7* 30.2* 28.3* 29.6*  PLT 227 268 467* 582*    COAGS:  Recent Labs  10/29/15 1851 10/30/15 1245 10/30/15 1636 11/06/15 0453 06/30/16 1605 07/06/16 1053  INR 1.28 1.42 1.34 1.38 1.29 1.15  APTT 29 27 25   --  32  --     BMP:  Recent Labs  07/01/16 0313 07/04/16 0313 07/08/16 0412 07/10/16 0945  NA 138 136 133* 132*  K 4.6 3.5 3.8 4.4  CL 106 100* 103 97*  CO2 23 29 22 28   GLUCOSE 101* 132* 146* 162*  BUN 18 14 13 13   CALCIUM 7.9* 7.5* 6.8* 7.1*  CREATININE 0.91 0.74 0.93 0.80  GFRNONAA >60 >60 >60 >60  GFRAA >60 >60 >60 >60    LIVER FUNCTION  TESTS:  Recent Labs  12/13/15 0454 12/22/15 1234 12/25/15 1736 12/26/15 0636  BILITOT 0.5 0.6 0.5 0.8  AST 22 21 26 17   ALT 39 21 21 16*  ALKPHOS 250* 105 108 87  PROT 5.9* 5.8* 6.6 5.4*  ALBUMIN 2.3* 2.2* 2.6* 2.1*    Assessment and Plan: Intraabdominal abscess s/p drain placements x2.  Continues with increased output.  Continue drains for now Patient is for possible d/c today.  Orders already in place for patient to be seen in drain clinic.  Patient aware schedulers will contact him with date and time. Aware of need to log output daily.   Electronically Signed: Docia Barrier 07/13/2016, 9:35 AM   I spent a total of 15 Minutes at the the patient's bedside AND on the patient's hospital floor or unit, greater than 50% of which was counseling/coordinating care for abdominal abscess

## 2016-07-13 NOTE — Discharge Summary (Signed)
Physician Discharge Summary  Patient ID: Miguel [name] MRN: [id number] DOB/AGE: 1950-06-07 66 y.o.  Admit date: 06/28/2016 Discharge date: 07/13/2016  Admission Diagnoses:  Discharge Diagnoses:  Active Problems:   S/P colostomy takedown G-tube leak Intra-abdominal abscess Incisional hernia  Discharged Condition: good  Hospital Course: This gentleman was admitted for elective colostomy takedown as well as repair of a very large incisional hernia. He underwent the procedure the day of admission and tolerated it well and was taken to the regular surgical floor. He had multiple drains in place. His immediate postoperative course was complicated by bleeding at his anastomosis. He ended up requiring 4 units of packed red blood cells and 2 units of fresh frozen plasma before the bleeding stopped without any need for surgical intervention. Once he began tolerating by mouth he was started back on his home tube feeds. He started having fevers. A chest x-ray did show some atelectasis the decision was made to proceed with a CAT scan of the abdomen and pelvis which showed multiple very large fluid collections. It was difficult to tell whether this is from his G-tube being dislodged from the stomach or from an anastomotic issue. Throughout, he was having bowel movements. His white blood count also remain normal. Interventional radiology was asked to consult on the patient. They placed to intra-abdominal drains under CAT scan guidance and also reposition his G-tube back into the stomach. He quickly defervesced. He continued to improve. He continued to tolerate his tube feeds and continued having bowel movements. His staples removed from incision. At the day of discharge, he been afebrile for several days. He was tolerating his tube feeds. He was and doing well. His pain appear to be controlled. The decision was made to discharge the patient home with drains in place with home health being arranged for drain care  and follow-up with interventional radiology and surgery.  Consults: IR  Significant Diagnostic Studies:   Treatments: surgery: colostomy takedown and repair of large incisional hernia IR for CT guided placement of intra-abdominal drains.  Discharge Exam: Blood pressure (!) 141/78, pulse 99, temperature 98.1 F (36.7 C), temperature source Oral, resp. rate 17, height 5\' 6"  (1.676 m), weight 69.9 kg (154 lb 1.6 oz), SpO2 96 %. General appearance: alert, cooperative and no distress Resp: clear to auscultation bilaterally Cardio: regular rate and rhythm, S1, S2 normal, no murmur, click, rub or gallop Incision/Wound:abdomen soft, incisions clean, G-tube working well, drains with purulence  Disposition: 06-Home-Health Care Svc  Discharge Instructions    AMB Referral to [facility]    Complete by:  As directed    Please assign [employer] member for post discharge call. Currently at [facility]. Please call with questions. Thanks. [name], [place], [facility] [phone]   Reason for consult:  Please assign UMR member for post discharge call   Expected date of contact:  1-3 days (reserved for hospital discharges)     Allergies as of 07/13/2016      Reactions   No Known Allergies       Medication List    TAKE these medications   acetaminophen 500 MG tablet Commonly known as:  TYLENOL Take 1 tablet (500 mg total) by mouth every 8 (eight) hours as needed for mild pain (for pain).   ALIGN 4 MG Caps Take 1 capsule by mouth daily.   amoxicillin-clavulanate 875-125 MG tablet Commonly known as:  AUGMENTIN Take 1 tablet by mouth 2 (two) times daily.   aspirin EC 81  MG tablet Take 81 mg by mouth daily after supper.   diclofenac sodium 1 % Gel Commonly known as:  VOLTAREN Apply 1 application topically 2 (two) times daily as needed (back and shoulder pain).   famotidine 20 MG tablet Commonly known as:  PEPCID Take 1 tablet (20 mg total) by mouth 2  (two) times daily.   feeding supplement (OSMOLITE 1.5 CAL) Liqd Place 1,000 mLs into feeding tube continuous.   ferrous sulfate 325 (65 FE) MG tablet Take 325 mg by mouth daily.   Fish Oil 1000 MG Cpdr Take 1,000 mg by mouth daily.   HYDROcodone-acetaminophen 5-325 MG tablet Commonly known as:  NORCO/VICODIN Take 1-2 tablets by mouth every 4 (four) hours as needed for moderate pain.   multivitamin with minerals Tabs tablet Take 1 tablet by mouth daily.   tamsulosin 0.4 MG Caps capsule Commonly known as:  FLOMAX Take 1 capsule (0.4 mg total) by mouth daily.   traMADol 50 MG tablet Commonly known as:  ULTRAM Take 50 mg by mouth every 6 (six) hours as needed for moderate pain.   zolpidem 10 MG tablet Commonly known as:  AMBIEN Take 5-10 mg by mouth at bedtime as needed and may repeat dose one time if needed for sleep.      Follow-up Information    Jacqulynn Cadet, MD Follow up.   Specialties:  Interventional Radiology, Radiology Why:  follow up with drain clinic 7-10 days after Discharge.; pt will hear from scheduler for time and date Contact information: [place] [address] [phone]        [facility] A, MD. Schedule an appointment as soon as possible for a visit on 07/16/2016.   Specialty:  General Surgery Contact information: [name] [place] [address] [phone]           Signed: Harl Bowie 07/13/2016, 1:03 PM

## 2016-07-13 NOTE — Progress Notes (Signed)
Discharge paperwork given to patient. Paperwork reviewed with patient and patient's wife. Prescriptions given. No questions verbalized. Patient is ready to discharge.

## 2016-07-14 LAB — CULTURE, BLOOD (ROUTINE X 2)
Culture: NO GROWTH
Culture: NO GROWTH
SPECIAL REQUESTS: ADEQUATE
SPECIAL REQUESTS: ADEQUATE

## 2016-07-15 ENCOUNTER — Inpatient Hospital Stay (HOSPITAL_COMMUNITY)
Admission: AD | Admit: 2016-07-15 | Discharge: 2016-07-21 | DRG: 862 | Disposition: A | Discharge status: Home Health | Payer: 59 | Source: Ambulatory Visit | Attending: Surgery | Admitting: Surgery

## 2016-07-15 ENCOUNTER — Other Ambulatory Visit: Payer: Self-pay | Admitting: General Surgery

## 2016-07-15 DIAGNOSIS — I10 Essential (primary) hypertension: Secondary | ICD-10-CM | POA: Diagnosis present

## 2016-07-15 DIAGNOSIS — M25461 Effusion, right knee: Secondary | ICD-10-CM | POA: Diagnosis present

## 2016-07-15 DIAGNOSIS — Z9884 Bariatric surgery status: Secondary | ICD-10-CM

## 2016-07-15 DIAGNOSIS — K651 Peritoneal abscess: Secondary | ICD-10-CM | POA: Diagnosis present

## 2016-07-15 DIAGNOSIS — K219 Gastro-esophageal reflux disease without esophagitis: Secondary | ICD-10-CM | POA: Diagnosis present

## 2016-07-15 DIAGNOSIS — Z8249 Family history of ischemic heart disease and other diseases of the circulatory system: Secondary | ICD-10-CM

## 2016-07-15 DIAGNOSIS — Z85820 Personal history of malignant melanoma of skin: Secondary | ICD-10-CM

## 2016-07-15 DIAGNOSIS — T814XXA Infection following a procedure, initial encounter: Principal | ICD-10-CM | POA: Diagnosis present

## 2016-07-15 DIAGNOSIS — B952 Enterococcus as the cause of diseases classified elsewhere: Secondary | ICD-10-CM | POA: Diagnosis present

## 2016-07-15 DIAGNOSIS — Z6824 Body mass index (BMI) 24.0-24.9, adult: Secondary | ICD-10-CM

## 2016-07-15 DIAGNOSIS — R509 Fever, unspecified: Secondary | ICD-10-CM

## 2016-07-15 DIAGNOSIS — L03311 Cellulitis of abdominal wall: Secondary | ICD-10-CM | POA: Diagnosis present

## 2016-07-15 DIAGNOSIS — E440 Moderate protein-calorie malnutrition: Secondary | ICD-10-CM | POA: Diagnosis not present

## 2016-07-15 DIAGNOSIS — Z823 Family history of stroke: Secondary | ICD-10-CM

## 2016-07-15 DIAGNOSIS — M25561 Pain in right knee: Secondary | ICD-10-CM | POA: Diagnosis not present

## 2016-07-15 DIAGNOSIS — L0390 Cellulitis, unspecified: Secondary | ICD-10-CM | POA: Diagnosis present

## 2016-07-15 DIAGNOSIS — E785 Hyperlipidemia, unspecified: Secondary | ICD-10-CM | POA: Diagnosis present

## 2016-07-15 DIAGNOSIS — Z09 Encounter for follow-up examination after completed treatment for conditions other than malignant neoplasm: Secondary | ICD-10-CM

## 2016-07-15 DIAGNOSIS — N400 Enlarged prostate without lower urinary tract symptoms: Secondary | ICD-10-CM | POA: Diagnosis present

## 2016-07-15 DIAGNOSIS — Y838 Other surgical procedures as the cause of abnormal reaction of the patient, or of later complication, without mention of misadventure at the time of the procedure: Secondary | ICD-10-CM | POA: Diagnosis present

## 2016-07-15 DIAGNOSIS — Z87442 Personal history of urinary calculi: Secondary | ICD-10-CM

## 2016-07-15 HISTORY — DX: Personal history of other diseases of the digestive system: Z87.19

## 2016-07-15 HISTORY — DX: Gastro-esophageal reflux disease without esophagitis: K21.9

## 2016-07-15 HISTORY — DX: Personal history of other medical treatment: Z92.89

## 2016-07-15 HISTORY — DX: Personal history of urinary calculi: Z87.442

## 2016-07-15 LAB — COMPREHENSIVE METABOLIC PANEL
ALT: 19 U/L (ref 17–63)
AST: 15 U/L (ref 15–41)
Albumin: 1.7 g/dL — ABNORMAL LOW (ref 3.5–5.0)
Alkaline Phosphatase: 76 U/L (ref 38–126)
Anion gap: 11 (ref 5–15)
BUN: 9 mg/dL (ref 6–20)
CHLORIDE: 96 mmol/L — AB (ref 101–111)
CO2: 24 mmol/L (ref 22–32)
CREATININE: 0.75 mg/dL (ref 0.61–1.24)
Calcium: 7.8 mg/dL — ABNORMAL LOW (ref 8.9–10.3)
GFR calc non Af Amer: >60 mL/min (ref >60)
Glucose, Bld: 101 mg/dL — ABNORMAL HIGH (ref 65–99)
Potassium: 4.1 mmol/L (ref 3.5–5.1)
Sodium: 131 mmol/L — ABNORMAL LOW (ref 135–145)
Total Bilirubin: 0.5 mg/dL (ref 0.3–1.2)
Total Protein: 5.2 g/dL — ABNORMAL LOW (ref 6.5–8.1)

## 2016-07-15 LAB — CBC WITH DIFFERENTIAL/PLATELET
BASOS ABS: 0 10*3/uL (ref 0.0–0.1)
Basophils Relative: 0 %
EOS ABS: 0.1 10*3/uL (ref 0.0–0.7)
Eosinophils Relative: 1 %
HCT: 26.8 % — ABNORMAL LOW (ref 39.0–52.0)
Hemoglobin: 8.9 g/dL — ABNORMAL LOW (ref 13.0–17.0)
Lymphocytes Relative: 22 %
Lymphs Abs: 1.9 10*3/uL (ref 0.7–4.0)
MCH: 29.3 pg (ref 26.0–34.0)
MCHC: 33.2 g/dL (ref 30.0–36.0)
MCV: 88.2 fL (ref 78.0–100.0)
MONO ABS: 1.1 10*3/uL — AB (ref 0.1–1.0)
Monocytes Relative: 12 %
NEUTROS PCT: 65 %
Neutro Abs: 5.7 10*3/uL (ref 1.7–7.7)
PLATELETS: 639 10*3/uL — AB (ref 150–400)
RBC: 3.04 MIL/uL — AB (ref 4.22–5.81)
RDW: 14.5 % (ref 11.5–15.5)
WBC: 8.8 10*3/uL (ref 4.0–10.5)

## 2016-07-15 MED ORDER — MORPHINE SULFATE (PF) 2 MG/ML IV SOLN
1.0000 mg | INTRAVENOUS | Status: DC | PRN
  Administered 2016-07-15 – 2016-07-19 (×33): 2 mg via INTRAVENOUS
  Administered 2016-07-19: 1 mg via INTRAVENOUS
  Administered 2016-07-19 (×3): 2 mg via INTRAVENOUS
  Administered 2016-07-19: 1 mg via INTRAVENOUS
  Administered 2016-07-19 – 2016-07-21 (×16): 2 mg via INTRAVENOUS
  Filled 2016-07-15 (×54): qty 1

## 2016-07-15 MED ORDER — DIPHENHYDRAMINE HCL 12.5 MG/5ML PO ELIX: 12.5000 mg | ORAL_SOLUTION | Freq: Four times a day (QID) | ORAL | Status: DC | PRN

## 2016-07-15 MED ORDER — SODIUM CHLORIDE 0.9 % IV SOLN
3.0000 g | Freq: Four times a day (QID) | INTRAVENOUS | Status: DC
  Administered 2016-07-15 – 2016-07-21 (×23): 3 g via INTRAVENOUS
  Filled 2016-07-15 (×25): qty 3

## 2016-07-15 MED ORDER — DIPHENHYDRAMINE HCL 50 MG/ML IJ SOLN: 12.5000 mg | Freq: Four times a day (QID) | INTRAMUSCULAR | Status: DC | PRN

## 2016-07-15 MED ORDER — KCL IN DEXTROSE-NACL 20-5-0.45 MEQ/L-%-% IV SOLN
INTRAVENOUS | Status: DC
  Administered 2016-07-15 – 2016-07-20 (×7): via INTRAVENOUS
  Filled 2016-07-15 (×7): qty 1000

## 2016-07-15 MED ORDER — IOPAMIDOL (ISOVUE-300) INJECTION 61%
INTRAVENOUS | Status: AC
  Administered 2016-07-15: 30 mL
  Filled 2016-07-15: qty 30

## 2016-07-15 MED ORDER — ONDANSETRON 4 MG PO TBDP: 4.0000 mg | ORAL_TABLET | Freq: Four times a day (QID) | ORAL | Status: DC | PRN

## 2016-07-15 MED ORDER — ONDANSETRON HCL 4 MG/2ML IJ SOLN: 4.0000 mg | Freq: Four times a day (QID) | INTRAMUSCULAR | Status: DC | PRN

## 2016-07-15 MED ORDER — HEPARIN SODIUM (PORCINE) 5000 UNIT/ML IJ SOLN
5000.0000 [IU] | Freq: Three times a day (TID) | INTRAMUSCULAR | Status: DC
  Administered 2016-07-15 – 2016-07-16 (×2): 5000 [IU] via SUBCUTANEOUS
  Filled 2016-07-15 (×2): qty 1

## 2016-07-15 MED ORDER — ZOLPIDEM TARTRATE 5 MG PO TABS
5.0000 mg | ORAL_TABLET | Freq: Every evening | ORAL | Status: DC | PRN
  Administered 2016-07-16 – 2016-07-21 (×5): 5 mg via ORAL
  Filled 2016-07-15 (×5): qty 1

## 2016-07-15 NOTE — Progress Notes (Signed)
Arrived to 6n16 at this time. Denies nausea/ pain, c/o "just being tired." Family at bedside

## 2016-07-15 NOTE — H&P (Signed)
Miguel [name] 07/15/2016 2:51 PM Location: [facility] Patient #: [id number] DOB: 09/11/1950 Married / Language: English / Race: White Male   History of Present Illness Miguel Hiss M. Wilson MD; 07/15/2016 3:17 PM) The patient is a 66 year old male presenting for a post-operative visit. He comes in for an urgent appointment because of a issue with his wound. He underwent colostomy takedown with colectomy along with open incisional hernia repair on April 2 with Dr. Ninfa [name]. He was just discharged on the 17th. He had a protracted hospital course. He had bleeding at his surgical anastomosis which required 4 units of red blood cells and 2 units of fresh frozen plasma but his bleeding eventually stopped. He underwent a CT scan of his abdomen and pelvis due to postoperative fevers and was found to have multiple intra-abdominal fluid collections. There is also a question of his G-tube being dislodged. He underwent repositioning of his G-tube as well as percutaneous drain placement 2. One of the drain grew enterococcus and his antibiotics were tailored to Augmentin. He has been getting nutrition through his G-tube. This morning they noticed a small low-grade fever of around 100.9. He started having spontaneous drainage from his midline incision. It was copious and a large amount and they contacted our office. The purulent drain (drain #2) has put out approximately 70 cc of purulent fluid since discharge. The other drain has been mainly serous fluid. reports BMs. no n/v.   Problem List/Past Medical Miguel Hiss M. 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 Redmond Pulling, MD; 07/15/2016 3:23 PM) Knee Surgery  Right.  Diagnostic Studies History Miguel Hiss M.  Redmond Pulling, MD; 07/15/2016 3:23 PM) Colonoscopy  1-5 years ago  Allergies Miguel Hiss M. Redmond Pulling, MD; 07/15/2016 3:23 PM) Allergies Reconciled  No Known Drug Allergies 07/21/2015  Medication History Miguel Hiss M. Redmond Pulling, MD; 07/15/2016 3:23 PM) Amoxicillin-Pot Clavulanate (875-125MG  Tablet, Oral) Active. Medications Reconciled OxyCODONE HCl (5MG  Tablet, 1 (one) Oral every six hours as needed for pain, Taken starting 05/05/2016) Active. TraMADol HCl (50MG  Tablet, 1 (one) Oral every six hours, as needed for pain, Taken starting 05/05/2016) Active. Pepcid (20MG  Tablet, 1 (one) Oral four times daily [4], Taken starting 05/12/2016) Active. (At meals and at bedtime) Tylenol (500MG  Capsule, Oral as needed) Active. Aspirin (81MG  Tablet DR, Oral daily) Active. Multiple Vitamin (1 (one) Oral daily) Active. Omega-3 Fatty Acids (1200MG  Capsule, Oral daily) Active. Red Yeast Rice Extract (300MG  Capsule, Oral daily) Active. Ambien (10MG  Tablet, Oral daily) Active. Promethazine HCl (25MG  Tablet, Oral) Active.  Social History Miguel Hiss M. Redmond Pulling, MD; 07/15/2016 3:23 PM) Caffeine use  Carbonated beverages. No alcohol use  No drug use  Tobacco use  Never smoker.  Family History Miguel Hiss M. Redmond Pulling, MD; 07/15/2016 3:23 PM) Cerebrovascular Accident  Brother, Mother. Heart Disease  Brother, Father. Hypertension  Mother, Sister.  Other Problems Miguel Hiss M. Redmond Pulling, MD; 07/15/2016 3:23 PM) Anxiety Disorder  Diverticulosis  Enlarged Prostate  Gastroesophageal Reflux Disease  Hemorrhoids  Kidney Stone  Melanoma  Unspecified Diagnosis   Vitals (Miguel [name] CMA; 07/15/2016 2:54 PM) 07/15/2016 2:53 PM Weight: 138.6 lb Height: 63in Body Surface Area: 1.65 m Body Mass Index: 24.55 kg/m  Temp.: 100.26F  Pulse: 107 (Regular)  BP: 160/92 (Sitting, Left Arm, Standard)       Physical Exam Miguel Hiss M. Redmond Pulling MD;  07/15/2016 3:20 PM) General Mental Status-Alert. General  Appearance-Consistent with stated age. Hydration-Well hydrated. Voice-Normal. Note: white male in wheelchair; non toxic; but appears chronically ill   Head and Neck Head-normocephalic, atraumatic with no lesions or palpable masses. Trachea-midline. Thyroid Gland Characteristics - normal size and consistency.  Eye Eyeball - Bilateral-Extraocular movements intact. Sclera/Conjunctiva - Bilateral-No scleral icterus.  Chest and Lung Exam Chest and lung exam reveals -quiet, even and easy respiratory effort with no use of accessory muscles and on auscultation, normal breath sounds, no adventitious sounds and normal vocal resonance. Inspection Chest Wall - Normal. Back - normal.  Breast - Did not examine.  Cardiovascular Cardiovascular examination reveals -normal heart sounds, regular rate and rhythm with no murmurs and normal pedal pulses bilaterally.  Abdomen Inspection Inspection of the abdomen reveals - No Hernias. Skin - Scar - Note: G-tube in position and secured; in the midline there is a almost completely closed incision however there is a small opening of about half a centimeter in the midportion of the incision. There is some surrounding blanching erythema extending to the left for about 4 cm. When I press on the surrounding skin there is serous fluid that evacuates. There is no bilious fluid. It may have some purulence within it.; Drains as previously described. Palpation/Percussion Palpation and Percussion of the abdomen reveal - Soft, Non Tender, No Rebound tenderness, No Rigidity (guarding) and No hepatosplenomegaly. Auscultation Auscultation of the abdomen reveals - Bowel sounds normal.  Peripheral Vascular Upper Extremity Palpation - Pulses bilaterally normal.  Neurologic Neurologic evaluation reveals -alert and oriented x 3 with no impairment of recent or remote memory. Mental Status-Normal.  Neuropsychiatric The patient's mood and affect are  described as -normal. Judgment and Insight-insight is appropriate concerning matters relevant to self.  Musculoskeletal Normal Exam - Left-Upper Extremity Strength Normal and Lower Extremity Strength Normal. Normal Exam - Right-Upper Extremity Strength Normal and Lower Extremity Strength Normal.  Lymphatic Head & Neck  General Head & Neck Lymphatics: Bilateral - Description - Normal. Axillary - Did not examine. Femoral & Inguinal - Did not examine.    Assessment & Plan Miguel Hiss M. 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 Given the low-grade fever, tachycardia I believe it is safest to admit the patient to the hospital for IV fluids, IV antibiotics, and a CT scan. This is most likely an undrained seroma within the abdominal soft tissue layer. Right now there is no clinical evidence of an enterocutaneous fistula. There is no evidence of an anastomotic leak given the drain output. HISTORY OF INCISIONAL HERNIA REPAIR (Z98.890) INTRA-ABDOMINAL ABSCESS (K65.1)

## 2016-07-15 NOTE — H&P (Signed)
Miguel [name] 07/15/2016 2:51 PM Location: [facility] Patient #: [id number] DOB: 06-18-1950 Married / Language: English / Race: White Male  History of Present Illness Miguel [name]; 07/15/2016 3:17 PM) The patient is a 66 year old male presenting for a post-operative visit. He comes in for an urgent appointment because of a issue with his wound. He underwent colostomy takedown with colectomy along with open incisional hernia repair on April 2 with Dr. Ninfa Linden. He was just discharged on the 17th. He had a protracted hospital course. He had bleeding at his surgical anastomosis which required 4 units of red blood cells and 2 units of fresh frozen plasma but his bleeding eventually stopped. He underwent a CT scan of his abdomen and pelvis due to postoperative fevers and was found to have multiple intra-abdominal fluid collections. There is also a question of his G-tube being dislodged. He underwent repositioning of his G-tube as well as percutaneous drain placement 2. One of the drain grew enterococcus and his antibiotics were tailored to Augmentin. He has been getting nutrition through his G-tube. This morning they noticed a small low-grade fever of around 100.9. He started having spontaneous drainage from his midline incision. It was copious and a large amount and they contacted our office. The purulent drain (drain #2) has put out approximately 70 cc of purulent fluid since discharge. The other drain has been mainly serous fluid. reports BMs. no n/v.   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  Diagnostic Studies History Miguel [name];  07/15/2016 3:23 PM) Colonoscopy 1-5 years ago  Allergies Miguel [name]; 07/15/2016 3:23 PM) Allergies Reconciled No Known Drug Allergies 07/21/2015  Medication History Miguel [name]; 07/15/2016 3:23 PM) Amoxicillin-Pot Clavulanate (875-125MG  Tablet, Oral) Active. Medications Reconciled OxyCODONE HCl (5MG  Tablet, 1 (one) Oral every six hours as needed for pain, Taken starting 05/05/2016) Active. TraMADol HCl (50MG  Tablet, 1 (one) Oral every six hours, as needed for pain, Taken starting 05/05/2016) Active. Pepcid (20MG  Tablet, 1 (one) Oral four times daily [4], Taken starting 05/12/2016) Active. (At meals and at bedtime) Tylenol (500MG  Capsule, Oral as needed) Active. Aspirin (81MG  Tablet DR, Oral daily) Active. Multiple Vitamin (1 (one) Oral daily) Active. Omega-3 Fatty Acids (1200MG  Capsule, Oral daily) Active. Red Yeast Rice Extract (300MG  Capsule, Oral daily) Active. Ambien (10MG  Tablet, Oral daily) Active. Promethazine HCl (25MG  Tablet, Oral) Active.  Social History Miguel [name]; 07/15/2016 3:23 PM) Caffeine use Carbonated beverages. No alcohol use No drug use Tobacco use Never smoker.  Family History Miguel [name]; 07/15/2016 3:23 PM) Cerebrovascular Accident Brother, Mother. Heart Disease Brother, Father. Hypertension Mother, Sister.  Other Problems Miguel [name]; 07/15/2016 3:23 PM) Anxiety Disorder Diverticulosis Enlarged Prostate Gastroesophageal Reflux Disease Hemorrhoids Kidney Stone Melanoma Unspecified Diagnosis    Vitals (Miguel [name] CMA; 07/15/2016 2:54 PM) 07/15/2016 2:53 PM Weight: 138.6 lb Height: 63in Body Surface Area: 1.65 m Body Mass Index: 24.55 kg/m  Temp.: 100.40F  Pulse: 107 (Regular)  BP: 160/92 (Sitting, Left Arm, Standard)      Physical Exam Miguel Hiss M. Ladaisha Portillo [name]; 07/15/2016 3:20 PM)  General Mental Status-Alert. General Appearance-Consistent with stated  age. Hydration-Well hydrated. Voice-Normal. Note: white male in wheelchair; non toxic; but  appears chronically ill  Head and Neck Head-normocephalic, atraumatic with no lesions or palpable masses. Trachea-midline. Thyroid Gland Characteristics - normal size and consistency.  Eye Eyeball - Bilateral-Extraocular movements intact. Sclera/Conjunctiva - Bilateral-No scleral icterus.  Chest and Lung Exam Chest and lung exam reveals -quiet, even and easy respiratory effort with no use of accessory muscles and on auscultation, normal breath sounds, no adventitious sounds and normal vocal resonance. Inspection Chest Wall - Normal. Back - normal.  Breast - Did not examine.  Cardiovascular Cardiovascular examination reveals -normal heart sounds, regular rate and rhythm with no murmurs and normal pedal pulses bilaterally.  Abdomen Inspection Inspection of the abdomen reveals - No Hernias. Skin - Scar - Note: G-tube in position and secured; in the midline there is a almost completely closed incision however there is a small opening of about half a centimeter in the midportion of the incision. There is some surrounding blanching erythema extending to the left for about 4 cm. When I press on the surrounding skin there is serous fluid that evacuates. There is no bilious fluid. It may have some purulence within it.; Drains as previously described. Palpation/Percussion Palpation and Percussion of the abdomen reveal - Soft, Non Tender, No Rebound tenderness, No Rigidity (guarding) and No hepatosplenomegaly. Auscultation Auscultation of the abdomen reveals - Bowel sounds normal.  Peripheral Vascular Upper Extremity Palpation - Pulses bilaterally normal.  Neurologic Neurologic evaluation reveals -alert and oriented x 3 with no impairment of recent or remote memory. Mental Status-Normal.  Neuropsychiatric The patient's mood and affect are described as -normal. Judgment and  Insight-insight is appropriate concerning matters relevant to self.  Musculoskeletal Normal Exam - Left-Upper Extremity Strength Normal and Lower Extremity Strength Normal. Normal Exam - Right-Upper Extremity Strength Normal and Lower Extremity Strength Normal.  Lymphatic Head & Neck  General Head & Neck Lymphatics: Bilateral - Description - Normal. Axillary - Did not examine. Femoral & Inguinal - Did not examine.    Assessment & Plan Miguel Hiss M. 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 Given the low-grade fever, tachycardia I believe it is safest to admit the patient to the hospital for IV fluids, IV antibiotics, and a CT scan. This is most likely an undrained seroma within the abdominal soft tissue layer. Right now there is no clinical evidence of an enterocutaneous fistula. There is no evidence of an anastomotic leak given the drain output.  HISTORY OF INCISIONAL HERNIA REPAIR (Z98.890)  INTRA-ABDOMINAL ABSCESS (K65.1)  Leighton Ruff. Redmond Pulling, [name], FACS General, Bariatric, & Minimally Invasive Surgery [facility], [place]

## 2016-07-15 NOTE — Progress Notes (Signed)
Paged on-call surgery regarding the use of patient's Gastronomy tube for contrast administration needed for CT abd/pelvis.  Dr. Ok Anis called back and verbally gave permission to use gastronomy tube for contrast admin.  CT staff appropriately advised.

## 2016-07-16 ENCOUNTER — Observation Stay (HOSPITAL_COMMUNITY): Payer: 59

## 2016-07-16 ENCOUNTER — Encounter (HOSPITAL_COMMUNITY): Payer: Self-pay | Admitting: Radiology

## 2016-07-16 DIAGNOSIS — K430 Incisional hernia with obstruction, without gangrene: Secondary | ICD-10-CM | POA: Diagnosis not present

## 2016-07-16 DIAGNOSIS — Z8249 Family history of ischemic heart disease and other diseases of the circulatory system: Secondary | ICD-10-CM | POA: Diagnosis not present

## 2016-07-16 DIAGNOSIS — Z87442 Personal history of urinary calculi: Secondary | ICD-10-CM | POA: Diagnosis not present

## 2016-07-16 DIAGNOSIS — E440 Moderate protein-calorie malnutrition: Secondary | ICD-10-CM | POA: Diagnosis present

## 2016-07-16 DIAGNOSIS — Z9884 Bariatric surgery status: Secondary | ICD-10-CM | POA: Diagnosis not present

## 2016-07-16 DIAGNOSIS — B952 Enterococcus as the cause of diseases classified elsewhere: Secondary | ICD-10-CM | POA: Diagnosis present

## 2016-07-16 DIAGNOSIS — M25561 Pain in right knee: Secondary | ICD-10-CM | POA: Diagnosis not present

## 2016-07-16 DIAGNOSIS — E785 Hyperlipidemia, unspecified: Secondary | ICD-10-CM | POA: Diagnosis present

## 2016-07-16 DIAGNOSIS — T814XXA Infection following a procedure, initial encounter: Secondary | ICD-10-CM | POA: Diagnosis present

## 2016-07-16 DIAGNOSIS — K219 Gastro-esophageal reflux disease without esophagitis: Secondary | ICD-10-CM | POA: Diagnosis present

## 2016-07-16 DIAGNOSIS — I10 Essential (primary) hypertension: Secondary | ICD-10-CM | POA: Diagnosis present

## 2016-07-16 DIAGNOSIS — L03311 Cellulitis of abdominal wall: Secondary | ICD-10-CM | POA: Diagnosis present

## 2016-07-16 DIAGNOSIS — Y838 Other surgical procedures as the cause of abnormal reaction of the patient, or of later complication, without mention of misadventure at the time of the procedure: Secondary | ICD-10-CM | POA: Diagnosis present

## 2016-07-16 DIAGNOSIS — Z85820 Personal history of malignant melanoma of skin: Secondary | ICD-10-CM | POA: Diagnosis not present

## 2016-07-16 DIAGNOSIS — M25461 Effusion, right knee: Secondary | ICD-10-CM | POA: Diagnosis not present

## 2016-07-16 DIAGNOSIS — Z6824 Body mass index (BMI) 24.0-24.9, adult: Secondary | ICD-10-CM | POA: Diagnosis not present

## 2016-07-16 DIAGNOSIS — N400 Enlarged prostate without lower urinary tract symptoms: Secondary | ICD-10-CM | POA: Diagnosis present

## 2016-07-16 DIAGNOSIS — Z823 Family history of stroke: Secondary | ICD-10-CM | POA: Diagnosis not present

## 2016-07-16 DIAGNOSIS — K651 Peritoneal abscess: Secondary | ICD-10-CM | POA: Diagnosis present

## 2016-07-16 MED ORDER — HEPARIN SODIUM (PORCINE) 5000 UNIT/ML IJ SOLN: 5000.0000 [IU] | Freq: Three times a day (TID) | INTRAMUSCULAR | Status: DC

## 2016-07-16 MED ORDER — HEPARIN SODIUM (PORCINE) 5000 UNIT/ML IJ SOLN
5000.0000 [IU] | Freq: Three times a day (TID) | INTRAMUSCULAR | Status: AC
  Administered 2016-07-16 (×2): 5000 [IU] via SUBCUTANEOUS
  Filled 2016-07-16 (×2): qty 1

## 2016-07-16 MED ORDER — IOPAMIDOL (ISOVUE-300) INJECTION 61%
INTRAVENOUS | Status: AC
  Filled 2016-07-16: qty 100

## 2016-07-16 MED ORDER — HEPARIN SODIUM (PORCINE) 5000 UNIT/ML IJ SOLN
5000.0000 [IU] | Freq: Three times a day (TID) | INTRAMUSCULAR | Status: DC
  Administered 2016-07-17 – 2016-07-21 (×11): 5000 [IU] via SUBCUTANEOUS
  Filled 2016-07-16 (×10): qty 1

## 2016-07-16 MED ORDER — IOPAMIDOL (ISOVUE-300) INJECTION 61%
INTRAVENOUS | Status: AC
  Administered 2016-07-16: 100 mL
  Filled 2016-07-16: qty 100

## 2016-07-16 NOTE — Progress Notes (Signed)
Subjective: Complains of mild lower abdominal fullness Last BM 2 days ago No increase pain. Had been tolerating tube feeds at home  Objective: Vital signs in last 24 hours: Temp:  [98.2 F (36.8 C)-100 F (37.8 C)] 98.2 F (36.8 C) (04/20 0510) Pulse Rate:  [95-109] 95 (04/20 0510) Resp:  [16] 16 (04/20 0510) BP: (97-125)/(71-79) 118/74 (04/20 0510) SpO2:  [96 %-98 %] 96 % (04/20 0510) Weight:  [63 kg (138 lb 12.8 oz)] 63 kg (138 lb 12.8 oz) (04/19 1701)    Intake/Output from previous day: 04/19 0701 - 04/20 0700 In: 2118.8 [I.V.:818.8; IV Piggyback:300] Out: 2860 [Urine:2800; Drains:60] Intake/Output this shift: Total I/O In: 1996.3 [I.V.:796.3; Other:1000; IV Piggyback:200] Out: 2820 [Urine:2800; Drains:20]  Exam: Appears comfortable Abdomen soft, minimally tender. Open wound with seropurulent drainage, mild erythema  Lab Results:   Recent Labs  07/15/16 1853  WBC 8.8  HGB 8.9*  HCT 26.8*  PLT 639*   BMET  Recent Labs  07/15/16 1853  NA 131*  K 4.1  CL 96*  CO2 24  GLUCOSE 101*  BUN 9  CREATININE 0.75  CALCIUM 7.8*   PT/INR No results for input(s): LABPROT, INR in the last 72 hours. ABG No results for input(s): PHART, HCO3 in the last 72 hours.  Invalid input(s): PCO2, PO2  Studies/Results: Ct Abdomen Pelvis W Contrast  Result Date: 07/16/2016 CLINICAL DATA:  Status post colostomy reversal, open repair of incisional hernia, intra-abdominal abscesses status post percutaneous drains. Skin incision separated today with drainage of serous fluid. Fever. EXAM: CT ABDOMEN AND PELVIS WITH CONTRAST TECHNIQUE: Multidetector CT imaging of the abdomen and pelvis was performed using the standard protocol following bolus administration of intravenous contrast. CONTRAST:  135mL ISOVUE-300 IOPAMIDOL (ISOVUE-300) INJECTION 61% COMPARISON:  CT prior to percutaneous drain placement 07/06/2016 FINDINGS: Lower chest: Small left pleural effusion. Adjacent  compressive atelectasis in the left lower lobe. Right lower lobe atelectasis without pleural fluid. Patulous distal esophagus. Postsurgical change at the gastroesophageal junction. Hepatobiliary: Tiny hypodensity in the right lobe of the liver are, unchanged. Pancreas: Atrophic parenchyma. No ductal dilatation or inflammation. Spleen: Normal in size.  No focal abnormality. Adrenals/Urinary Tract: Bilateral nonobstructing nephrolithiasis. No hydronephrosis or perinephric edema. Normal adrenal glands. Right renal cyst is unchanged. Urinary bladder is thick walled, chronic. Stomach/Bowel: Post partial gastrectomy. Enteric tube in place, presumably tenting the residual gastric lumen. Gastrojejunostomy appears patent, there is no evidence of extraluminal enteric contrast. Enteric contrast throughout small bowel which is nondilated. Multifocal colonic diverticulosis. Enteric sutures at the junction of the descending and sigmoid colon. Colonic wall toes levels ill-defined. Vascular/Lymphatic: He aortic atherosclerosis. No retroperitoneal or pelvic adenopathy. Limited assessment for mesenteric adenopathy. Reproductive: Enlarged prostate gland again seen. Other: Multiple intra-abdominal abscesses. Percutaneous strain in left upper quadrant fluid collection, with significant decrease in size from prior CT. Small volume of persistent loculated fluid persists. Percutaneous drain in the left lower abdomen with near complete resolution of large anterior pelvic fluid collection. Persistent peripherally enhancing fluid collection in the mid pelvis measuring 11 x 4 cm, previously 11 x 5.8 cm. This may be an serpiginous continuity with a deep pelvic fluid collection that measures 3.9 x 3.5 just posterior to the urinary bladder. Persistent air-fluid levels/free air in the anterior abdomen, seen adjacent to the lower percutaneous strain and tracking into the right upper quadrant. Extraluminal air collection in the right upper quadrant  cause apparent adhesions anterior to the liver. Air from this collection tracks to the midline anteriorly to the  anterior abdominal wall were there is defect of the anterior abdominal wall skin. Possible communication within the anterior midline abdomen to smaller fluid collection measuring 4.1 x 1.0 cm. Small foci of extraluminal air persist in the right upper quadrant and anteriorly. Musculoskeletal: Stable osseous structures from prior. IMPRESSION: 1. Multiple intra-abdominal abscesses and air-fluid collections, post percutaneous drainage of dominant left upper quadrant and anterior pelvic abscesses. Percutaneous drainage results in significant improvement in these collections. 2. Persistent lower abdominal/ pelvic abscess in the right midline measuring 11 x 4 cm, possibly in serpiginous continuity with a smaller pelvic abscess. Smaller fluid collections elsewhere persist. 3. Free air in the upper abdomen, with some loculations in the extraluminal air in the right upper quadrant. This is in the region of previous air-fluid levels in fluid collections, felt to be secondary to inflammatory process. No evidence of acute bowel perforation. No extraluminal enteric contrast. 4. Defect of the midline anterior abdominal wall incision with air tracking just deep to the incision. Possible communication of this midline defect to a small fluid collection anteriorly measuring 4.1 x 1.0 cm. Electronically Signed   By: Jeb Levering M.D.   On: 07/16/2016 03:43    Anti-infectives: Anti-infectives    Start     Dose/Rate Route Frequency Ordered Stop   07/15/16 1800  Ampicillin-Sulbactam (UNASYN) 3 g in sodium chloride 0.9 % 100 mL IVPB     3 g 200 mL/hr over 30 Minutes Intravenous Every 6 hours 07/15/16 1703        Assessment/Plan:  Post op intra-abdominal abscesses.  Start wound care Allow clear liquids Hold tube feeds for 24 hours Notify IR pt is in hospital for drain opinion  LOS: 0 days     Icey Tello A 07/16/2016

## 2016-07-16 NOTE — Progress Notes (Signed)
Patient ID: Miguel [name], male   DOB: [birth year], 66 y.o.   MRN: [id number]    Referring Physician(s): Dr. [name]  Supervising Physician: Corrie Mckusick  Patient Status: [facility] - In-pt  Chief Complaint: Intra-abdominal fluid collection  Subjective: The patient is well-know to the IR service secondary to two prior percutaneous drain placements on 4/10 by Dr. Kathlene Cote in his LUQ and lower abdomen secondary to intra-abdominal abscesses s/p colostomy takedown and incisional hernia repair on 06/28/16.  He was discussed home on 4/17 with both drains in place.  The LUQ drain had mostly serosanguineous output and the lower drain with tan milky output.  He was discharged on Augmentin.  He saw Dr. Ninfa Linden in the office yesterday secondary to a temp of 100.9 at home as well as some redness and drainage from his midline incision.  He was readmitted for abx therapy and had a repeat CT scan completed.  This showed "Multiple intra-abdominal abscesses and air-fluid collections, post percutaneous drainage of dominant left upper quadrant and anterior pelvic abscesses. Percutaneous drainage results in significant improvement in these collections. 2. Persistent lower abdominal/ pelvic abscess in the right midline measuring 11 x 4 cm, possibly in serpiginous continuity with a smaller pelvic abscess. Smaller fluid collections elsewhere persist"    We have been asked to evaluate his old drains and to determine if this larger central collection can be drained as well.  Allergies: No known allergies  Medications: Prior to Admission medications   Medication Sig Start Date End Date Taking? Authorizing Provider  acetaminophen (TYLENOL) 500 MG tablet Take 1 tablet (500 mg total) by mouth every 8 (eight) hours as needed for mild pain (for pain). 12/05/15   Darci Current Simaan, PA-C  amoxicillin-clavulanate (AUGMENTIN) 875-125 MG tablet Take 1 tablet by mouth 2 (two) times daily. 07/13/16 07/27/16  [name], MD  aspirin EC 81 MG tablet Take 81 mg by mouth daily after supper.     Historical Provider, MD  diclofenac sodium (VOLTAREN) 1 % GEL Apply 1 application topically 2 (two) times daily as needed (back and shoulder pain).    Historical Provider, MD  famotidine (PEPCID) 20 MG tablet Take 1 tablet (20 mg total) by mouth 2 (two) times daily. 11/09/15   Elwin Mocha, MD  ferrous sulfate 325 (65 FE) MG tablet Take 325 mg by mouth daily.    Historical Provider, MD  HYDROcodone-acetaminophen (NORCO/VICODIN) 5-325 MG tablet Take 1-2 tablets by mouth every 4 (four) hours as needed for moderate pain. 07/13/16   [name], MD  Multiple Vitamin (MULTIVITAMIN WITH MINERALS) TABS tablet Take 1 tablet by mouth daily.    Historical Provider, MD  Nutritional Supplements (FEEDING SUPPLEMENT, OSMOLITE 1.5 CAL,) LIQD Place 1,000 mLs into feeding tube continuous.    Historical Provider, MD  Omega-3 Fatty Acids (FISH OIL) 1000 MG CPDR Take 1,000 mg by mouth daily.    Historical Provider, MD  Probiotic Product (ALIGN) 4 MG CAPS Take 1 capsule by mouth daily.    Historical Provider, MD  tamsulosin (FLOMAX) 0.4 MG CAPS capsule Take 1 capsule (0.4 mg total) by mouth daily. 01/04/16   Fanny Skates, MD  traMADol (ULTRAM) 50 MG tablet Take 50 mg by mouth every 6 (six) hours as needed for moderate pain.  05/21/16   Historical Provider, MD  zolpidem (AMBIEN) 10 MG tablet Take 5-10 mg by mouth at bedtime as needed and may repeat dose one time if needed for sleep.     Historical Provider, MD  Vital Signs: BP 118/74 (BP Location: Left Arm)   Pulse 95   Temp 98.2 F (36.8 C) (Oral)   Resp 16   Ht 5\' 3"  (1.6 m)   Wt 138 lb 12.8 oz (63 kg)   SpO2 96%   BMI 24.59 kg/m   Physical Exam: Gen: NAD, pleasant white male Heart: regular Lungs: CTAB Abd: midline wound is covered with gauze and tape.  LUQ drain with minimal serosanguineous output.  Lower midline abdominal drain in place with tan, milky output.   g-tube in place as well.  Imaging: Ct Abdomen Pelvis W Contrast  Result Date: 07/16/2016 CLINICAL DATA:  Status post colostomy reversal, open repair of incisional hernia, intra-abdominal abscesses status post percutaneous drains. Skin incision separated today with drainage of serous fluid. Fever. EXAM: CT ABDOMEN AND PELVIS WITH CONTRAST TECHNIQUE: Multidetector CT imaging of the abdomen and pelvis was performed using the standard protocol following bolus administration of intravenous contrast. CONTRAST:  113mL ISOVUE-300 IOPAMIDOL (ISOVUE-300) INJECTION 61% COMPARISON:  CT prior to percutaneous drain placement 07/06/2016 FINDINGS: Lower chest: Small left pleural effusion. Adjacent compressive atelectasis in the left lower lobe. Right lower lobe atelectasis without pleural fluid. Patulous distal esophagus. Postsurgical change at the gastroesophageal junction. Hepatobiliary: Tiny hypodensity in the right lobe of the liver are, unchanged. Pancreas: Atrophic parenchyma. No ductal dilatation or inflammation. Spleen: Normal in size.  No focal abnormality. Adrenals/Urinary Tract: Bilateral nonobstructing nephrolithiasis. No hydronephrosis or perinephric edema. Normal adrenal glands. Right renal cyst is unchanged. Urinary bladder is thick walled, chronic. Stomach/Bowel: Post partial gastrectomy. Enteric tube in place, presumably tenting the residual gastric lumen. Gastrojejunostomy appears patent, there is no evidence of extraluminal enteric contrast. Enteric contrast throughout small bowel which is nondilated. Multifocal colonic diverticulosis. Enteric sutures at the junction of the descending and sigmoid colon. Colonic wall toes levels ill-defined. Vascular/Lymphatic: He aortic atherosclerosis. No retroperitoneal or pelvic adenopathy. Limited assessment for mesenteric adenopathy. Reproductive: Enlarged prostate gland again seen. Other: Multiple intra-abdominal abscesses. Percutaneous strain in left upper quadrant  fluid collection, with significant decrease in size from prior CT. Small volume of persistent loculated fluid persists. Percutaneous drain in the left lower abdomen with near complete resolution of large anterior pelvic fluid collection. Persistent peripherally enhancing fluid collection in the mid pelvis measuring 11 x 4 cm, previously 11 x 5.8 cm. This may be an serpiginous continuity with a deep pelvic fluid collection that measures 3.9 x 3.5 just posterior to the urinary bladder. Persistent air-fluid levels/free air in the anterior abdomen, seen adjacent to the lower percutaneous strain and tracking into the right upper quadrant. Extraluminal air collection in the right upper quadrant cause apparent adhesions anterior to the liver. Air from this collection tracks to the midline anteriorly to the anterior abdominal wall were there is defect of the anterior abdominal wall skin. Possible communication within the anterior midline abdomen to smaller fluid collection measuring 4.1 x 1.0 cm. Small foci of extraluminal air persist in the right upper quadrant and anteriorly. Musculoskeletal: Stable osseous structures from prior. IMPRESSION: 1. Multiple intra-abdominal abscesses and air-fluid collections, post percutaneous drainage of dominant left upper quadrant and anterior pelvic abscesses. Percutaneous drainage results in significant improvement in these collections. 2. Persistent lower abdominal/ pelvic abscess in the right midline measuring 11 x 4 cm, possibly in serpiginous continuity with a smaller pelvic abscess. Smaller fluid collections elsewhere persist. 3. Free air in the upper abdomen, with some loculations in the extraluminal air in the right upper quadrant. This is in the region  of previous air-fluid levels in fluid collections, felt to be secondary to inflammatory process. No evidence of acute bowel perforation. No extraluminal enteric contrast. 4. Defect of the midline anterior abdominal wall incision  with air tracking just deep to the incision. Possible communication of this midline defect to a small fluid collection anteriorly measuring 4.1 x 1.0 cm. Electronically Signed   By: Jeb Levering M.D.   On: 07/16/2016 03:43    Labs:  CBC:  Recent Labs  07/04/16 0313 07/08/16 0412 07/10/16 0945 07/15/16 1853  WBC 8.2 14.4* 13.3* 8.8  HGB 10.1* 9.4* 9.5* 8.9*  HCT 30.2* 28.3* 29.6* 26.8*  PLT 268 467* 582* 639*    COAGS:  Recent Labs  10/29/15 1851 10/30/15 1245 10/30/15 1636 11/06/15 0453 06/30/16 1605 07/06/16 1053  INR 1.28 1.42 1.34 1.38 1.29 1.15  APTT 29 27 25   --  32  --     BMP:  Recent Labs  07/04/16 0313 07/08/16 0412 07/10/16 0945 07/15/16 1853  NA 136 133* 132* 131*  K 3.5 3.8 4.4 4.1  CL 100* 103 97* 96*  CO2 29 22 28 24   GLUCOSE 132* 146* 162* 101*  BUN 14 13 13 9   CALCIUM 7.5* 6.8* 7.1* 7.8*  CREATININE 0.74 0.93 0.80 0.75  GFRNONAA >60 >60 >60 >60  GFRAA >60 >60 >60 >60    LIVER FUNCTION TESTS:  Recent Labs  12/22/15 1234 12/25/15 1736 12/26/15 0636 07/15/16 1853  BILITOT 0.6 0.5 0.8 0.5  AST 21 26 17 15   ALT 21 21 16* 19  ALKPHOS 105 108 87 76  PROT 5.8* 6.6 5.4* 5.2*  ALBUMIN 2.2* 2.6* 2.1* 1.7*    Assessment and Plan: 1. Multiple intra-abdominal fluid collections, s/p perc drain x2 on 4/10 Both prior drains are in place and imaging has been reviewed by Dr. Earleen Newport.  We will leave both of these drains in for now.  Patient has a follow up appointment at clinic on 07/27/16.    Dr. Earleen Newport has also looked at the CT and feels we can attempt to place a drain in this larger fluid collection.  He will likely have to be placed prone or on his left lateral decub.  He will be NPO p MN and hold his heparin tomorrow.  We will plan to do this tomorrow.    Risks and Benefits discussed with the patient including bleeding, infection, damage to adjacent structures, bowel perforation/fistula connection, and sepsis. All of the patient's  questions were answered, patient is agreeable to proceed. Consent signed and in chart.  Electronically Signed: Henreitta Cea 07/16/2016, 10:26 AM   I spent a total of 25 Minutes at the the patient's bedside AND on the patient's hospital floor or unit, greater than 50% of which was counseling/coordinating care for intra-abdominal fluid collection

## 2016-07-16 NOTE — Care Management Note (Signed)
Case Management Note  Patient Details  Name: CHAVEZ ROSOL MRN: [id number] Date of Birth: [birth year]  Subjective/Objective:                    Action/Plan:  Patient from home with wife, on tube feedings provided through [facility]. Also had HHRN for drain flushes. Will need a resumption of care order at discharge. Expected Discharge Date:                  Expected Discharge Plan:  [place]  In-House Referral:     Discharge planning Services  CM Consult  Post Acute Care Choice:  Home Health Choice offered to:  Patient  DME Arranged:    DME Agency:     HH Arranged:    [place] Agency:     Status of Service:  In process, will continue to follow  If discussed at Long Length of Stay Meetings, dates discussed:    Additional Comments:  Marilu Favre, RN 07/16/2016, 10:41 AM

## 2016-07-17 ENCOUNTER — Inpatient Hospital Stay (HOSPITAL_COMMUNITY): Payer: 59

## 2016-07-17 MED ORDER — MIDAZOLAM HCL 2 MG/2ML IJ SOLN
INTRAMUSCULAR | Status: AC | PRN
  Administered 2016-07-17 (×2): 1 mg via INTRAVENOUS

## 2016-07-17 MED ORDER — JEVITY 1.2 CAL PO LIQD: 1000.0000 mL | ORAL | Status: DC

## 2016-07-17 MED ORDER — PRO-STAT SUGAR FREE PO LIQD
30.0000 mL | Freq: Every day | ORAL | Status: DC
  Administered 2016-07-17 – 2016-07-18 (×2): 30 mL
  Filled 2016-07-17 (×2): qty 30

## 2016-07-17 MED ORDER — OSMOLITE 1.5 CAL PO LIQD
1000.0000 mL | ORAL | Status: AC
  Administered 2016-07-17: 1000 mL
  Filled 2016-07-17: qty 1000

## 2016-07-17 MED ORDER — TAMSULOSIN HCL 0.4 MG PO CAPS
0.4000 mg | ORAL_CAPSULE | Freq: Every day | ORAL | Status: DC
  Administered 2016-07-17 – 2016-07-21 (×5): 0.4 mg via ORAL
  Filled 2016-07-17 (×5): qty 1

## 2016-07-17 MED ORDER — LIDOCAINE HCL 1 % IJ SOLN
INTRAMUSCULAR | Status: AC
  Filled 2016-07-17: qty 20

## 2016-07-17 MED ORDER — FENTANYL CITRATE (PF) 100 MCG/2ML IJ SOLN
INTRAMUSCULAR | Status: AC
Start: 2016-07-17 — End: 2016-07-17
  Filled 2016-07-17: qty 2

## 2016-07-17 MED ORDER — MIDAZOLAM HCL 2 MG/2ML IJ SOLN
INTRAMUSCULAR | Status: AC
  Filled 2016-07-17: qty 2

## 2016-07-17 MED ORDER — FENTANYL CITRATE (PF) 100 MCG/2ML IJ SOLN
INTRAMUSCULAR | Status: AC | PRN
  Administered 2016-07-17 (×2): 50 ug via INTRAVENOUS

## 2016-07-17 NOTE — Progress Notes (Signed)
Subjective: Feels better after drain, purulent fluid, no complaints  Objective: Vital signs in last 24 hours: Temp:  [98.4 F (36.9 C)-99.3 F (37.4 C)] 99.3 F (37.4 C) (04/21 1230) Pulse Rate:  [94-110] 95 (04/21 1230) Resp:  [12-22] 20 (04/21 1230) BP: (105-133)/(57-89) 122/78 (04/21 1230) SpO2:  [96 %-100 %] 97 % (04/21 1230) Last BM Date: 07/15/16  Intake/Output from previous day: 04/20 0701 - 04/21 0700 In: 1864.2 [P.O.:240; I.V.:1224.2; IV Piggyback:400] Out: 2120 [Urine:2075; Drains:45] Intake/Output this shift: Total I/O In: -  Out: 205 [Urine:100; Drains:105]  GI: soft nt/nd drains with serous and cloudy fluid, wound dressed  Lab Results:   Recent Labs  07/15/16 1853  WBC 8.8  HGB 8.9*  HCT 26.8*  PLT 639*   BMET  Recent Labs  07/15/16 1853  NA 131*  K 4.1  CL 96*  CO2 24  GLUCOSE 101*  BUN 9  CREATININE 0.75  CALCIUM 7.8*   PT/INR No results for input(s): LABPROT, INR in the last 72 hours. ABG No results for input(s): PHART, HCO3 in the last 72 hours.  Invalid input(s): PCO2, PO2  Studies/Results: Ct Abdomen Pelvis W Contrast  Result Date: 07/16/2016 CLINICAL DATA:  Status post colostomy reversal, open repair of incisional hernia, intra-abdominal abscesses status post percutaneous drains. Skin incision separated today with drainage of serous fluid. Fever. EXAM: CT ABDOMEN AND PELVIS WITH CONTRAST TECHNIQUE: Multidetector CT imaging of the abdomen and pelvis was performed using the standard protocol following bolus administration of intravenous contrast. CONTRAST:  139mL ISOVUE-300 IOPAMIDOL (ISOVUE-300) INJECTION 61% COMPARISON:  CT prior to percutaneous drain placement 07/06/2016 FINDINGS: Lower chest: Small left pleural effusion. Adjacent compressive atelectasis in the left lower lobe. Right lower lobe atelectasis without pleural fluid. Patulous distal esophagus. Postsurgical change at the gastroesophageal junction. Hepatobiliary: Tiny  hypodensity in the right lobe of the liver are, unchanged. Pancreas: Atrophic parenchyma. No ductal dilatation or inflammation. Spleen: Normal in size.  No focal abnormality. Adrenals/Urinary Tract: Bilateral nonobstructing nephrolithiasis. No hydronephrosis or perinephric edema. Normal adrenal glands. Right renal cyst is unchanged. Urinary bladder is thick walled, chronic. Stomach/Bowel: Post partial gastrectomy. Enteric tube in place, presumably tenting the residual gastric lumen. Gastrojejunostomy appears patent, there is no evidence of extraluminal enteric contrast. Enteric contrast throughout small bowel which is nondilated. Multifocal colonic diverticulosis. Enteric sutures at the junction of the descending and sigmoid colon. Colonic wall toes levels ill-defined. Vascular/Lymphatic: He aortic atherosclerosis. No retroperitoneal or pelvic adenopathy. Limited assessment for mesenteric adenopathy. Reproductive: Enlarged prostate gland again seen. Other: Multiple intra-abdominal abscesses. Percutaneous strain in left upper quadrant fluid collection, with significant decrease in size from prior CT. Small volume of persistent loculated fluid persists. Percutaneous drain in the left lower abdomen with near complete resolution of large anterior pelvic fluid collection. Persistent peripherally enhancing fluid collection in the mid pelvis measuring 11 x 4 cm, previously 11 x 5.8 cm. This may be an serpiginous continuity with a deep pelvic fluid collection that measures 3.9 x 3.5 just posterior to the urinary bladder. Persistent air-fluid levels/free air in the anterior abdomen, seen adjacent to the lower percutaneous strain and tracking into the right upper quadrant. Extraluminal air collection in the right upper quadrant cause apparent adhesions anterior to the liver. Air from this collection tracks to the midline anteriorly to the anterior abdominal wall were there is defect of the anterior abdominal wall skin.  Possible communication within the anterior midline abdomen to smaller fluid collection measuring 4.1 x 1.0 cm. Small foci of  extraluminal air persist in the right upper quadrant and anteriorly. Musculoskeletal: Stable osseous structures from prior. IMPRESSION: 1. Multiple intra-abdominal abscesses and air-fluid collections, post percutaneous drainage of dominant left upper quadrant and anterior pelvic abscesses. Percutaneous drainage results in significant improvement in these collections. 2. Persistent lower abdominal/ pelvic abscess in the right midline measuring 11 x 4 cm, possibly in serpiginous continuity with a smaller pelvic abscess. Smaller fluid collections elsewhere persist. 3. Free air in the upper abdomen, with some loculations in the extraluminal air in the right upper quadrant. This is in the region of previous air-fluid levels in fluid collections, felt to be secondary to inflammatory process. No evidence of acute bowel perforation. No extraluminal enteric contrast. 4. Defect of the midline anterior abdominal wall incision with air tracking just deep to the incision. Possible communication of this midline defect to a small fluid collection anteriorly measuring 4.1 x 1.0 cm. Electronically Signed   By: Jeb Levering M.D.   On: 07/16/2016 03:43    Anti-infectives: Anti-infectives    Start     Dose/Rate Route Frequency Ordered Stop   07/15/16 1800  Ampicillin-Sulbactam (UNASYN) 3 g in sodium chloride 0.9 % 100 mL IVPB     3 g 200 mL/hr over 30 Minutes Intravenous Every 6 hours 07/15/16 1703        Assessment/Plan: IA abscess, postop  Continue dressing changes Continue drains Will give fulls, restart tube feeds Sq heparin   Demarrius Guerrero 07/17/2016

## 2016-07-17 NOTE — Sedation Documentation (Signed)
Patient denies pain and is resting comfortably.  

## 2016-07-17 NOTE — Progress Notes (Addendum)
Initial Nutrition Assessment  DOCUMENTATION CODES:  Non-severe (moderate) malnutrition in context of chronic illness   Pt meets criteria for MODERATE MALNUTRITION in the context of Chronic illness as evidenced by Mild muscle/fat wasting  INTERVENTION:  Initiate home Tube feeding regimen of  Osmolite 1.5 @ 55 ml/hr via PEG from 3 pm to 7 am.   Tube feeding regimen provides 1485 kcal (93% of needs), 62 grams of protein, and 754 ml of H2O.   Add additional 30 ml Prostat q 24 hrs to better meet estimated protein needs.   If IVF are stopped, would recommend 200 cc free water q 4 hrs to meet 100% fluid needs.   Patient was not interested in attempting to increase PO intake and wean from TF at this time due to taste changes and dislike of the supplements. RD to follow up next week and can discuss weaning.   NUTRITION DIAGNOSIS:  Altered GI function related to Perforated peptic ulcer as evidenced by Requiring PEG for majority of nutrition.  GOAL:  Patient will meet greater than or equal to 90% of their needs  MONITOR:  PO intake, Supplement acceptance, Diet advancement, Labs, Weight trends, TF tolerance, I & O's  REASON FOR ASSESSMENT:  Consult Enteral/tube feeding initiation and management  ASSESSMENT:  66 y/o male PMHx HTN, HLD, diverticulitis with perforation ~1 year ago that necessitated Hartman's procedure. 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 He had a G tube placed which he has been receiving TF from. He had been hospitalized (4/2-4/17)  for a takedown of his colostomy. Represents today for IV abx, Fluids and ct scan due to drainage from wound detected at f/u appointment. RD consulted for TF management.   Wt was roughly 172 lbs at time of hartman procedure last April. Had only lost 10 lbs (162 lbs) until August when admitted with his Peptic Ulcer perforation. Had his G tube placed on 11/21/15. Following that long admission, he lost down to  128 lbs, but has gained some weight back and Since October, his weight has been stable at 137-142 lbs.   His tube feeding was as follows: he would infuse 4, 8 oz cartons of osmolite 1.5 each day. He played with the rate of the feeding at home and found 55 cc/hr to work the best. He had tried 65cc/hr, but found he would reflux some of the TF while sleeping at this rate. He would start his TF in the afternoon, around 3 and end it in the morning, around 7. This worked very well for him  Today, pt reports that at home He did eat/drink a little bit. He consumed juice, nuts that he chewed up very fine and chips with dip. Most foods did not taste good to him though and says he essentially was maintained completely with the tube feeding.   RD discussed weaning him now vs later. He says he currently does not have an appetite. RD offered supplements but he says these have made him sick in the past.   Per discussion with patient, decided to stabilize him back on home regimen first and then next week RD can follow up and make recommendations for weaning and reintroducing foods.   Physical exam: Mild-moderate fat/muscle wasting.   Labs reviewed: Albumin: 1.7, Na: 131, BG 105-120 Medications: Fentanyl, Versed, D5 w/ .45% NS and KCL, ABx, Morphne   Recent Labs Lab 07/15/16 1853  NA 131*  K 4.1  CL 96*  CO2 24  BUN  9  CREATININE 0.75  CALCIUM 7.8*  GLUCOSE 101*   Diet Order:  Diet full liquid Room service appropriate? Yes; Fluid consistency: Thin  Skin:  Drains to abdomen   Last BM:  07/15/16  Height:  Ht Readings from Last 1 Encounters:  07/15/16 5\' 3"  (1.6 m)   Weight:  Wt Readings from Last 1 Encounters:  07/15/16 138 lb 12.8 oz (63 kg)   Wt Readings from Last 10 Encounters:  07/15/16 138 lb 12.8 oz (63 kg)  07/13/16 154 lb 1.6 oz (69.9 kg)  06/21/16 138 lb 14.4 oz (63 kg)  01/03/16 141 lb (64 kg)  12/23/15 146 lb 8 oz (66.5 kg)  11/25/15 156 lb 4.9 oz (70.9 kg)  11/08/15 171 lb  9.6 oz (77.8 kg)  07/05/15 172 lb 2.9 oz (78.1 kg)  05/05/12 166 lb 4.8 oz (75.4 kg)  05/07/11 176 lb 4.8 oz (80 kg)   Ideal Body Weight:  56.36 kg  BMI:  Body mass index is 24.59 kg/m.  Estimated Nutritional Needs:  Kcal:  1600-1800 (25-29 kcal/kg bw) Protein:  80-95g (1.3-1.5 g/kg bw) Fluid:  >1.9 L (30 ml/kcal)  EDUCATION NEEDS:  No education needs identified at this time  Burtis Junes RD, LDN, [place] [facility] Pager: [phone] 07/17/2016 4:05 PM

## 2016-07-17 NOTE — Procedures (Signed)
Interventional Radiology Procedure Note  Procedure: CT guided abdominal drain, 78F into abd abscess.  ~105cc purulent fluid removed. Sample to lab.  Complications: None Recommendations:  - Stable to room - Do not submerge  - Routine care   Signed,  Dulcy Fanny. Earleen Newport, DO

## 2016-07-18 LAB — BASIC METABOLIC PANEL
ANION GAP: 6 (ref 5–15)
BUN: 10 mg/dL (ref 6–20)
CO2: 27 mmol/L (ref 22–32)
Calcium: 7.4 mg/dL — ABNORMAL LOW (ref 8.9–10.3)
Chloride: 95 mmol/L — ABNORMAL LOW (ref 101–111)
Creatinine, Ser: 0.8 mg/dL (ref 0.61–1.24)
GLUCOSE: 123 mg/dL — AB (ref 65–99)
POTASSIUM: 4 mmol/L (ref 3.5–5.1)
Sodium: 128 mmol/L — ABNORMAL LOW (ref 135–145)

## 2016-07-18 LAB — GLUCOSE, CAPILLARY
GLUCOSE-CAPILLARY: 110 mg/dL — AB (ref 65–99)
GLUCOSE-CAPILLARY: 115 mg/dL — AB (ref 65–99)
GLUCOSE-CAPILLARY: 116 mg/dL — AB (ref 65–99)
GLUCOSE-CAPILLARY: 128 mg/dL — AB (ref 65–99)
Glucose-Capillary: 125 mg/dL — ABNORMAL HIGH (ref 65–99)
Glucose-Capillary: 131 mg/dL — ABNORMAL HIGH (ref 65–99)

## 2016-07-18 LAB — CBC
HEMATOCRIT: 30.2 % — AB (ref 39.0–52.0)
Hemoglobin: 9.7 g/dL — ABNORMAL LOW (ref 13.0–17.0)
MCH: 28.4 pg (ref 26.0–34.0)
MCHC: 32.1 g/dL (ref 30.0–36.0)
MCV: 88.3 fL (ref 78.0–100.0)
PLATELETS: 665 10*3/uL — AB (ref 150–400)
RBC: 3.42 MIL/uL — AB (ref 4.22–5.81)
RDW: 14 % (ref 11.5–15.5)
WBC: 13 10*3/uL — AB (ref 4.0–10.5)

## 2016-07-18 NOTE — Progress Notes (Signed)
Referring Physician(s): Blackman,D  Supervising Physician: Corrie Mckusick  Patient Status:  [facility] - In-pt  Chief Complaint:  Abdominal fluid collections  Subjective: Pt resting quietly, no acute c/o    Allergies: Patient has no known allergies.  Medications: Prior to Admission medications   Medication Sig Start Date End Date Taking? Authorizing Provider  amoxicillin-clavulanate (AUGMENTIN) 875-125 MG tablet Take 1 tablet by mouth 2 (two) times daily. 07/13/16 07/27/16 Yes [name], MD  aspirin EC 81 MG tablet Take 81 mg by mouth daily after supper.    Yes Historical Provider, MD  diclofenac sodium (VOLTAREN) 1 % GEL Apply 1 application topically 2 (two) times daily as needed (back and shoulder pain).   Yes Historical Provider, MD  famotidine (PEPCID) 20 MG tablet Take 1 tablet (20 mg total) by mouth 2 (two) times daily. 11/09/15  Yes Elwin Mocha, MD  ferrous sulfate 325 (65 FE) MG tablet Take 325 mg by mouth daily.   Yes Historical Provider, MD  HYDROcodone-acetaminophen (NORCO/VICODIN) 5-325 MG tablet Take 1-2 tablets by mouth every 4 (four) hours as needed for moderate pain. 07/13/16  Yes [name], MD  Multiple Vitamin (MULTIVITAMIN WITH MINERALS) TABS tablet Take 1 tablet by mouth daily.   Yes Historical Provider, MD  Nutritional Supplements (FEEDING SUPPLEMENT, OSMOLITE 1.5 CAL,) LIQD Place 1,000 mLs into feeding tube continuous.   Yes Historical Provider, MD  Omega-3 Fatty Acids (FISH OIL) 1000 MG CPDR Take 1,000 mg by mouth daily.   Yes Historical Provider, MD  Probiotic Product (ALIGN) 4 MG CAPS Take 1 capsule by mouth daily.   Yes Historical Provider, MD  tamsulosin (FLOMAX) 0.4 MG CAPS capsule Take 1 capsule (0.4 mg total) by mouth daily. 01/04/16  Yes Fanny Skates, MD  traMADol (ULTRAM) 50 MG tablet Take 50 mg by mouth every 6 (six) hours as needed for moderate pain.  05/21/16  Yes Historical Provider, MD  zolpidem (AMBIEN) 10 MG tablet Take 5-10 mg by  mouth at bedtime as needed and may repeat dose one time if needed for sleep.    Yes Historical Provider, MD  acetaminophen (TYLENOL) 500 MG tablet Take 1 tablet (500 mg total) by mouth every 8 (eight) hours as needed for mild pain (for pain). 12/05/15   Jill Alexanders, PA-C     Vital Signs: BP 112/66 (BP Location: Left Arm)   Pulse (!) 103   Temp 98.8 F (37.1 C) (Oral)   Resp 18   Ht 5\' 3"  (1.6 m)   Wt 129 lb 14.4 oz (58.9 kg)   SpO2 95%   BMI 23.01 kg/m   Physical Exam abd drains intact, outputs 225 cc on right ;3/45 cc left drains; insertion sites ok, minimal tenderness  Imaging: Ct Abdomen Pelvis W Contrast  Result Date: 07/16/2016 CLINICAL DATA:  Status post colostomy reversal, open repair of incisional hernia, intra-abdominal abscesses status post percutaneous drains. Skin incision separated today with drainage of serous fluid. Fever. EXAM: CT ABDOMEN AND PELVIS WITH CONTRAST TECHNIQUE: Multidetector CT imaging of the abdomen and pelvis was performed using the standard protocol following bolus administration of intravenous contrast. CONTRAST:  140mL ISOVUE-300 IOPAMIDOL (ISOVUE-300) INJECTION 61% COMPARISON:  CT prior to percutaneous drain placement 07/06/2016 FINDINGS: Lower chest: Small left pleural effusion. Adjacent compressive atelectasis in the left lower lobe. Right lower lobe atelectasis without pleural fluid. Patulous distal esophagus. Postsurgical change at the gastroesophageal junction. Hepatobiliary: Tiny hypodensity in the right lobe of the liver are, unchanged. Pancreas: Atrophic parenchyma. No  ductal dilatation or inflammation. Spleen: Normal in size.  No focal abnormality. Adrenals/Urinary Tract: Bilateral nonobstructing nephrolithiasis. No hydronephrosis or perinephric edema. Normal adrenal glands. Right renal cyst is unchanged. Urinary bladder is thick walled, chronic. Stomach/Bowel: Post partial gastrectomy. Enteric tube in place, presumably tenting the residual  gastric lumen. Gastrojejunostomy appears patent, there is no evidence of extraluminal enteric contrast. Enteric contrast throughout small bowel which is nondilated. Multifocal colonic diverticulosis. Enteric sutures at the junction of the descending and sigmoid colon. Colonic wall toes levels ill-defined. Vascular/Lymphatic: He aortic atherosclerosis. No retroperitoneal or pelvic adenopathy. Limited assessment for mesenteric adenopathy. Reproductive: Enlarged prostate gland again seen. Other: Multiple intra-abdominal abscesses. Percutaneous strain in left upper quadrant fluid collection, with significant decrease in size from prior CT. Small volume of persistent loculated fluid persists. Percutaneous drain in the left lower abdomen with near complete resolution of large anterior pelvic fluid collection. Persistent peripherally enhancing fluid collection in the mid pelvis measuring 11 x 4 cm, previously 11 x 5.8 cm. This may be an serpiginous continuity with a deep pelvic fluid collection that measures 3.9 x 3.5 just posterior to the urinary bladder. Persistent air-fluid levels/free air in the anterior abdomen, seen adjacent to the lower percutaneous strain and tracking into the right upper quadrant. Extraluminal air collection in the right upper quadrant cause apparent adhesions anterior to the liver. Air from this collection tracks to the midline anteriorly to the anterior abdominal wall were there is defect of the anterior abdominal wall skin. Possible communication within the anterior midline abdomen to smaller fluid collection measuring 4.1 x 1.0 cm. Small foci of extraluminal air persist in the right upper quadrant and anteriorly. Musculoskeletal: Stable osseous structures from prior. IMPRESSION: 1. Multiple intra-abdominal abscesses and air-fluid collections, post percutaneous drainage of dominant left upper quadrant and anterior pelvic abscesses. Percutaneous drainage results in significant improvement in  these collections. 2. Persistent lower abdominal/ pelvic abscess in the right midline measuring 11 x 4 cm, possibly in serpiginous continuity with a smaller pelvic abscess. Smaller fluid collections elsewhere persist. 3. Free air in the upper abdomen, with some loculations in the extraluminal air in the right upper quadrant. This is in the region of previous air-fluid levels in fluid collections, felt to be secondary to inflammatory process. No evidence of acute bowel perforation. No extraluminal enteric contrast. 4. Defect of the midline anterior abdominal wall incision with air tracking just deep to the incision. Possible communication of this midline defect to a small fluid collection anteriorly measuring 4.1 x 1.0 cm. Electronically Signed   By: Jeb Levering M.D.   On: 07/16/2016 03:43   Ct Image Guided Drainage By Percutaneous Catheter  Result Date: 07/18/2016 INDICATION: 66 year old male with a history of abdominal abscess. EXAM: CT GUIDED DRAINAGE OF PELVIC ABSCESS MEDICATIONS: The patient is currently admitted to the hospital and receiving intravenous antibiotics. The antibiotics were administered within an appropriate time frame prior to the initiation of the procedure. ANESTHESIA/SEDATION: 2.0 mg IV Versed 100 mcg IV Fentanyl Moderate Sedation Time:  16 The patient was continuously monitored during the procedure by the interventional radiology nurse under my direct supervision. COMPLICATIONS: None TECHNIQUE: Informed written consent was obtained from the patient after a thorough discussion of the procedural risks, benefits and alternatives. All questions were addressed. Maximal Sterile Barrier Technique was utilized including caps, mask, sterile gowns, sterile gloves, sterile drape, hand hygiene and skin antiseptic. A timeout was performed prior to the initiation of the procedure. PROCEDURE: Patient is position prone. Scout CT was  performed. The posterior right abdomen was prepped with  chlorhexidine in a sterile fashion, and a sterile drape was applied covering the operative field. A sterile gown and sterile gloves were used for the procedure. Local anesthesia was provided with 1% Lidocaine. 1% lidocaine was used for local anesthesia. Using CT guidance, 18 gauge trocar needle was advanced into the complex fluid collection of the pelvis. Using modified Seldinger technique and soft tissue dilation, a 10 French drain was placed into the fluid collection within the pelvis. Purulent fluid was aspirated with approximately 100 cc removed. Sample was sent to the lab. Catheter was sutured in position. Final image was stored. Catheter was attached to gravity drainage. Patient tolerated the procedure well and remained hemodynamically stable throughout. No complications were encountered and no significant blood loss. FINDINGS: Scout images demonstrate fluid collection within the pelvis. Final image demonstrates placement of 10 French pigtail catheter into the collection with near complete decompression. IMPRESSION: Status post CT-guided drainage of pelvic fluid collection. Sample was sent to the lab. Signed, Dulcy Fanny. Earleen Newport, DO [facility] Electronically Signed   By: Corrie Mckusick D.O.   On: 07/18/2016 09:02    Labs:  CBC:  Recent Labs  07/04/16 0313 07/08/16 0412 07/10/16 0945 07/15/16 1853  WBC 8.2 14.4* 13.3* 8.8  HGB 10.1* 9.4* 9.5* 8.9*  HCT 30.2* 28.3* 29.6* 26.8*  PLT 268 467* 582* 639*    COAGS:  Recent Labs  10/29/15 1851 10/30/15 1245 10/30/15 1636 11/06/15 0453 06/30/16 1605 07/06/16 1053  INR 1.28 1.42 1.34 1.38 1.29 1.15  APTT 29 27 25   --  32  --     BMP:  Recent Labs  07/04/16 0313 07/08/16 0412 07/10/16 0945 07/15/16 1853  NA 136 133* 132* 131*  K 3.5 3.8 4.4 4.1  CL 100* 103 97* 96*  CO2 29 22 28 24   GLUCOSE 132* 146* 162* 101*  BUN 14 13 13 9   CALCIUM 7.5* 6.8* 7.1* 7.8*  CREATININE  0.74 0.93 0.80 0.75  GFRNONAA >60 >60 >60 >60  GFRAA >60 >60 >60 >60    LIVER FUNCTION TESTS:  Recent Labs  12/22/15 1234 12/25/15 1736 12/26/15 0636 07/15/16 1853  BILITOT 0.6 0.5 0.8 0.5  AST 21 26 17 15   ALT 21 21 16* 19  ALKPHOS 105 108 87 76  PROT 5.8* 6.6 5.4* 5.2*  ALBUMIN 2.2* 2.6* 2.1* 1.7*    Assessment and Plan: Multiple intra-abdominal fluid collections, s/p perc drain x2 on 4/10; new drain 4/22; AF; labs pend; prior cx- enterobacter, candida, bacteroiodes; new cx pend; cont drains for now/irrigate; check f/u CT latter part of next week   Electronically Signed: D. Rowe Robert 07/18/2016, 12:15 PM   I spent a total of 15 minutes at the the patient's bedside AND on the patient's hospital floor or unit, greater than 50% of which was counseling/coordinating care for abdominal drains    Patient ID: Miguel [name], male   DOB: 1950-08-02, 66 y.o.   MRN: [id number]

## 2016-07-18 NOTE — Progress Notes (Signed)
Paged dietitian for tube feeding order. Order from 07/17/16 had expired. No one has called back at this time and I have reported to the night nurse caring for the patient of this. The patient is aware and I also had talked to pharmacy about the order. The pharmacist opinion was that it may have been an error in the way the osmalite was ordered.

## 2016-07-18 NOTE — Progress Notes (Signed)
[facility] Progress Note     Subjective:   CC: abdominal pain  Pain is improved since drain placement. No fevers or acute events overnight. Pt is having flatus but no BM  Objective: Vital signs in last 24 hours: Temp:  [98.8 F (37.1 C)-100 F (37.8 C)] 98.8 F (37.1 C) (04/22 0500) Pulse Rate:  [95-104] 103 (04/22 0500) Resp:  [18-20] 18 (04/22 0500) BP: (112-128)/(66-78) 112/66 (04/22 0500) SpO2:  [95 %-98 %] 95 % (04/22 0500) Weight:  [129 lb 14.4 oz (58.9 kg)-132 lb 4.8 oz (60 kg)] 129 lb 14.4 oz (58.9 kg) (04/22 0500) Last BM Date: 07/15/16  Intake/Output from previous day: 04/21 0701 - 04/22 0700 In: 1910.8 [P.O.:60; I.V.:940.8; NG/GT:594.9; IV Piggyback:300] Out: 1118 [Urine:845; Drains:273] Intake/Output this shift: No intake/output data recorded.  PE: Gen:  Alert, NAD, pleasant, cooperative, well appearing Card:  RRR, no M/G/R heard Pulm:  CTA, no W/R/R, effort normal Abd: Soft, not distended, +BS,  Midline incision appears well healing with small open area that is without purulent drainage (repacked), LLQ drain with purulent drainage, other drain with serous output, mild generalized TTP, PEG tube site and drain sites C/D/I Skin: no rashes noted, warm and dry      Lab Results:   Recent Labs  07/15/16 1853  WBC 8.8  HGB 8.9*  HCT 26.8*  PLT 639*   BMET  Recent Labs  07/15/16 1853  NA 131*  K 4.1  CL 96*  CO2 24  GLUCOSE 101*  BUN 9  CREATININE 0.75  CALCIUM 7.8*   PT/INR No results for input(s): LABPROT, INR in the last 72 hours. CMP     Component Value Date/Time   NA 131 (L) 07/15/2016 1853   K 4.1 07/15/2016 1853   CL 96 (L) 07/15/2016 1853   CO2 24 07/15/2016 1853   GLUCOSE 101 (H) 07/15/2016 1853   BUN 9 07/15/2016 1853   CREATININE 0.75 07/15/2016 1853   CALCIUM 7.8 (L) 07/15/2016 1853   PROT 5.2 (L) 07/15/2016 1853   ALBUMIN 1.7 (L) 07/15/2016 1853   AST 15 07/15/2016 1853   ALT 19 07/15/2016 1853   ALKPHOS  76 07/15/2016 1853   BILITOT 0.5 07/15/2016 1853   GFRNONAA >60 07/15/2016 1853   GFRAA >60 07/15/2016 1853   Lipase     Component Value Date/Time   LIPASE 44 11/13/2015 2300       Studies/Results: Ct Image Guided Drainage By Percutaneous Catheter  Result Date: 07/18/2016 INDICATION: 66 year old male with a history of abdominal abscess. EXAM: CT GUIDED DRAINAGE OF PELVIC ABSCESS MEDICATIONS: The patient is currently admitted to the hospital and receiving intravenous antibiotics. The antibiotics were administered within an appropriate time frame prior to the initiation of the procedure. ANESTHESIA/SEDATION: 2.0 mg IV Versed 100 mcg IV Fentanyl Moderate Sedation Time:  16 The patient was continuously monitored during the procedure by the interventional radiology nurse under my direct supervision. COMPLICATIONS: None TECHNIQUE: Informed written consent was obtained from the patient after a thorough discussion of the procedural risks, benefits and alternatives. All questions were addressed. Maximal Sterile Barrier Technique was utilized including caps, mask, sterile gowns, sterile gloves, sterile drape, hand hygiene and skin antiseptic. A timeout was performed prior to the initiation of the procedure. PROCEDURE: Patient is position prone. Scout CT was performed. The posterior right abdomen was prepped with chlorhexidine in a sterile fashion, and a sterile drape was applied covering the operative field. A sterile gown and sterile gloves were used for  the procedure. Local anesthesia was provided with 1% Lidocaine. 1% lidocaine was used for local anesthesia. Using CT guidance, 18 gauge trocar needle was advanced into the complex fluid collection of the pelvis. Using modified Seldinger technique and soft tissue dilation, a 10 French drain was placed into the fluid collection within the pelvis. Purulent fluid was aspirated with approximately 100 cc removed. Sample was sent to the lab. Catheter was sutured in  position. Final image was stored. Catheter was attached to gravity drainage. Patient tolerated the procedure well and remained hemodynamically stable throughout. No complications were encountered and no significant blood loss. FINDINGS: Scout images demonstrate fluid collection within the pelvis. Final image demonstrates placement of 10 French pigtail catheter into the collection with near complete decompression. IMPRESSION: Status post CT-guided drainage of pelvic fluid collection. Sample was sent to the lab. Signed, Dulcy Fanny. Earleen Newport, DO [facility] Electronically Signed   By: Corrie Mckusick D.O.   On: 07/18/2016 09:02    Anti-infectives: Anti-infectives    Start     Dose/Rate Route Frequency Ordered Stop   07/15/16 1800  Ampicillin-Sulbactam (UNASYN) 3 g in sodium chloride 0.9 % 100 mL IVPB     3 g 200 mL/hr over 30 Minutes Intravenous Every 6 hours 07/15/16 1703         Assessment/Plan  Intraabdominal abscess - IR placed drain yesterday - pain improved - dressing changes, wet to dry BID to midline - continue tube feeds - CBC pending to evaluate WBC and Hg   FEN: fulls and TF VTE: heparin  ID: Unasyn 4/19>>    LOS: 2 days    Kalman Drape , [facility] 07/18/2016, 11:43 AM Pager: [phone] Consults: [phone] Mon-Fri 7:00 am-4:30 pm Sat-Sun 7:00 am-11:30 am

## 2016-07-19 DIAGNOSIS — E440 Moderate protein-calorie malnutrition: Secondary | ICD-10-CM | POA: Insufficient documentation

## 2016-07-19 LAB — GLUCOSE, CAPILLARY
GLUCOSE-CAPILLARY: 117 mg/dL — AB (ref 65–99)
GLUCOSE-CAPILLARY: 116 mg/dL — AB (ref 65–99)
GLUCOSE-CAPILLARY: 135 mg/dL — AB (ref 65–99)
GLUCOSE-CAPILLARY: 133 mg/dL — AB (ref 65–99)
Glucose-Capillary: 117 mg/dL — ABNORMAL HIGH (ref 65–99)
Glucose-Capillary: 152 mg/dL — ABNORMAL HIGH (ref 65–99)
Glucose-Capillary: 94 mg/dL (ref 65–99)

## 2016-07-19 LAB — BASIC METABOLIC PANEL
Anion gap: 7 (ref 5–15)
BUN: 11 mg/dL (ref 6–20)
CHLORIDE: 97 mmol/L — AB (ref 101–111)
CO2: 27 mmol/L (ref 22–32)
CREATININE: 0.79 mg/dL (ref 0.61–1.24)
Calcium: 7.6 mg/dL — ABNORMAL LOW (ref 8.9–10.3)
Glucose, Bld: 118 mg/dL — ABNORMAL HIGH (ref 65–99)
POTASSIUM: 3.9 mmol/L (ref 3.5–5.1)
SODIUM: 131 mmol/L — AB (ref 135–145)

## 2016-07-19 LAB — CBC
HCT: 29.6 % — ABNORMAL LOW (ref 39.0–52.0)
Hemoglobin: 9.5 g/dL — ABNORMAL LOW (ref 13.0–17.0)
MCH: 28.5 pg (ref 26.0–34.0)
MCHC: 32.1 g/dL (ref 30.0–36.0)
MCV: 88.9 fL (ref 78.0–100.0)
PLATELETS: 649 10*3/uL — AB (ref 150–400)
RBC: 3.33 MIL/uL — AB (ref 4.22–5.81)
RDW: 14.1 % (ref 11.5–15.5)
WBC: 13 10*3/uL — AB (ref 4.0–10.5)

## 2016-07-19 MED ORDER — OSMOLITE 1.5 CAL PO LIQD
1000.0000 mL | ORAL | Status: DC
  Administered 2016-07-20 (×2): 1000 mL
  Filled 2016-07-19 (×6): qty 1000

## 2016-07-19 MED ORDER — OSMOLITE 1.5 CAL PO LIQD
1000.0000 mL | ORAL | Status: DC
  Administered 2016-07-19: 1000 mL
  Filled 2016-07-19 (×2): qty 1000

## 2016-07-19 MED ORDER — PRO-STAT SUGAR FREE PO LIQD
30.0000 mL | Freq: Every day | ORAL | Status: DC
  Administered 2016-07-19: 30 mL
  Filled 2016-07-19: qty 30

## 2016-07-19 NOTE — Care Management Note (Signed)
Case Management Note  Patient Details  Name: Miguel [name] MRN: [id number] Date of Birth: 1950-05-28  Subjective/Objective:                    Action/Plan:  Patient from home with wife and [facility] RN for dressing changes and drain flushes . Same reordered and given to [facility].  Patient on tube feedings prior to admission through [facility]. New new orders needed unless Tube feedings change.   Expected Discharge Date:                  Expected Discharge Plan:  [place]  In-House Referral:     Discharge planning Services  CM Consult  Post Acute Care Choice:  Home Health Choice offered to:  Patient, Spouse  DME Arranged:    DME Agency:  NA  HH Arranged:  RN [place] Agency:  [place]  Status of Service:  Completed, signed off  If discussed at [place] of Stay Meetings, dates discussed:    Additional Comments:  Marilu Favre, RN 07/19/2016, 11:13 AM

## 2016-07-19 NOTE — Progress Notes (Signed)
Nutrition Follow-up  DOCUMENTATION CODES:   Non-severe (moderate) malnutrition in context of chronic illness  INTERVENTION:   Continue Osmolite 1.5 @ 55 ml/hr via PEG over 18 hour period (1500-0700)  Tube feeding regimen provides 1485 kcal (87% of needs), 62 grams of protein, and 754 ml of H2O.   -D/c Prostat daily  NUTRITION DIAGNOSIS:   Altered GI function related to  (Perforated peptic ulcer) as evidenced by  (Requiring PEG with postpyloric feeds for majority of nutrition).  Ongoing  GOAL:   Patient will meet greater than or equal to 90% of their needs  Progressing  MONITOR:   PO intake, Supplement acceptance, Diet advancement, Labs, Weight trends, TF tolerance, I & O's  REASON FOR ASSESSMENT:   Consult Enteral/tube feeding initiation and management  ASSESSMENT:   Patient is a 66 year old male presenting for a post-operative visit. He comes in for an urgent appointment because of a issue with his wound.  He underwent colostomy takedown with colectomy along with open incisional hernia repair on April 2 with Dr. Ninfa Linden.  He was just discharged on the 17th.  He had a protracted hospital course.  He had bleeding at his surgical anastomosis which required 4 units of red blood cells and 2 units of fresh frozen plasma but his bleeding eventually stopped.  He underwent a CT scan of his abdomen and pelvis due to postoperative fevers and was found to have multiple intra-abdominal fluid collections.  4/21- abdominal drain placed  Followed up with pt per request of weekend RD. Pt receiving nursing care at time of visit.   Case discussed with RN, who reports TF restarted and pt tolerating well. Pt has been complaining of pain this shift.   Pt is very familiar to this RD due to multiple prior admissions. He receives the majority of nutrition via TF- pt eats very minimal PO at baseline and avoids dairy products and oral nutrition supplements due to poor tolerance. Home TF regimen  is Osmolite 1.5 @ 55 ml/hr over an 18 hour period; he is followed by [place].   Labs reviewed: Na: 131, CBGS: 94-117.   Diet Order:  Diet full liquid Room service appropriate? Yes; Fluid consistency: Thin  Skin:  Reviewed, no issues  Last BM:  07/15/16  Height:   Ht Readings from Last 1 Encounters:  07/15/16 5\' 3"  (1.6 m)    Weight:   Wt Readings from Last 1 Encounters:  07/19/16 130 lb 11.7 oz (59.3 kg)    Ideal Body Weight:  56.36 kg  BMI:  Body mass index is 23.16 kg/m.  Estimated Nutritional Needs:   Kcal:  1700-1900  Protein:  90-105 grams  Fluid:  > 1.7 L  EDUCATION NEEDS:   No education needs identified at this time  Kerissa Coia A. Jimmye Norman, RD, LDN, CDE Pager: [phone] After hours Pager: [phone]

## 2016-07-19 NOTE — Progress Notes (Signed)
PT Cancellation Note  Patient Details Name: Miguel [name] MRN: [id number] DOB: 1951/02/08   Cancelled Treatment:    Reason Eval/Treat Not Completed: Patient declined, no reason specified (pt reports fatigue and pain after ambulating on unit an hour ago. Pt states he has painful, swollen right knee and wants to speak to the doctor and attempt P.T. next date)   [name] [name] 07/19/2016, 1:27 [place], [place]

## 2016-07-19 NOTE — Progress Notes (Signed)
Subjective: No complaints this morning Passing flatus No BM in several days  Objective: Vital signs in last 24 hours: Temp:  [97.9 F (36.6 C)-98.9 F (37.2 C)] 98.3 F (36.8 C) (04/23 0450) Pulse Rate:  [98-101] 98 (04/23 0450) Resp:  [17-18] 18 (04/23 0450) BP: (114-122)/(66-68) 122/68 (04/23 0450) SpO2:  [95 %-99 %] 95 % (04/23 0450) Weight:  [59.3 kg (130 lb 11.7 oz)] 59.3 kg (130 lb 11.7 oz) (04/23 0450) Last BM Date: 07/15/16  Intake/Output from previous day: 04/22 0701 - 04/23 0700 In: 2990 [P.O.:180; I.V.:1400; NG/GT:990; IV Piggyback:400] Out: 2235 [Urine:2200; Drains:35] Intake/Output this shift: Total I/O In: 1061.7 [P.O.:60; I.V.:686.7; Other:15; IV Piggyback:300] Out: 1285 [Urine:1250; Drains:35]  Exam: Looks well Abdomen soft, almost non-tender Drains seropurulent  Lab Results:   Recent Labs  07/18/16 1156 07/19/16 0343  WBC 13.0* 13.0*  HGB 9.7* 9.5*  HCT 30.2* 29.6*  PLT 665* 649*   BMET  Recent Labs  07/18/16 1156 07/19/16 0343  NA 128* 131*  K 4.0 3.9  CL 95* 97*  CO2 27 27  GLUCOSE 123* 118*  BUN 10 11  CREATININE 0.80 0.79  CALCIUM 7.4* 7.6*   PT/INR No results for input(s): LABPROT, INR in the last 72 hours. ABG No results for input(s): PHART, HCO3 in the last 72 hours.  Invalid input(s): PCO2, PO2  Studies/Results: Ct Image Guided Drainage By Percutaneous Catheter  Result Date: 07/18/2016 INDICATION: 66 year old male with a history of abdominal abscess. EXAM: CT GUIDED DRAINAGE OF PELVIC ABSCESS MEDICATIONS: The patient is currently admitted to the hospital and receiving intravenous antibiotics. The antibiotics were administered within an appropriate time frame prior to the initiation of the procedure. ANESTHESIA/SEDATION: 2.0 mg IV Versed 100 mcg IV Fentanyl Moderate Sedation Time:  16 The patient was continuously monitored during the procedure by the interventional radiology nurse under my direct supervision.  COMPLICATIONS: None TECHNIQUE: Informed written consent was obtained from the patient after a thorough discussion of the procedural risks, benefits and alternatives. All questions were addressed. Maximal Sterile Barrier Technique was utilized including caps, mask, sterile gowns, sterile gloves, sterile drape, hand hygiene and skin antiseptic. A timeout was performed prior to the initiation of the procedure. PROCEDURE: Patient is position prone. Scout CT was performed. The posterior right abdomen was prepped with chlorhexidine in a sterile fashion, and a sterile drape was applied covering the operative field. A sterile gown and sterile gloves were used for the procedure. Local anesthesia was provided with 1% Lidocaine. 1% lidocaine was used for local anesthesia. Using CT guidance, 18 gauge trocar needle was advanced into the complex fluid collection of the pelvis. Using modified Seldinger technique and soft tissue dilation, a 10 French drain was placed into the fluid collection within the pelvis. Purulent fluid was aspirated with approximately 100 cc removed. Sample was sent to the lab. Catheter was sutured in position. Final image was stored. Catheter was attached to gravity drainage. Patient tolerated the procedure well and remained hemodynamically stable throughout. No complications were encountered and no significant blood loss. FINDINGS: Scout images demonstrate fluid collection within the pelvis. Final image demonstrates placement of 10 French pigtail catheter into the collection with near complete decompression. IMPRESSION: Status post CT-guided drainage of pelvic fluid collection. Sample was sent to the lab. Signed, Dulcy Fanny. Earleen Newport, DO [facility] Radiology Electronically Signed   By: Corrie Mckusick D.O.   On: 07/18/2016 09:02    Anti-infectives: Anti-infectives    Start  Dose/Rate Route Frequency Ordered Stop   07/15/16 1800  Ampicillin-Sulbactam  (UNASYN) 3 g in sodium chloride 0.9 % 100 mL IVPB     3 g 200 mL/hr over 30 Minutes Intravenous Every 6 hours 07/15/16 1703        Assessment/Plan:  Postop intra-abdominal abscess  Resume tube feeds Ambulate   LOS: 3 days    Miguel [name] A 07/19/2016

## 2016-07-20 DIAGNOSIS — M25461 Effusion, right knee: Secondary | ICD-10-CM

## 2016-07-20 DIAGNOSIS — M25561 Pain in right knee: Secondary | ICD-10-CM

## 2016-07-20 LAB — GLUCOSE, CAPILLARY
GLUCOSE-CAPILLARY: 125 mg/dL — AB (ref 65–99)
GLUCOSE-CAPILLARY: 134 mg/dL — AB (ref 65–99)
Glucose-Capillary: 134 mg/dL — ABNORMAL HIGH (ref 65–99)
Glucose-Capillary: 123 mg/dL — ABNORMAL HIGH (ref 65–99)
Glucose-Capillary: 161 mg/dL — ABNORMAL HIGH (ref 65–99)

## 2016-07-20 NOTE — Progress Notes (Signed)
Patient ID: Miguel [name], male   DOB: 10-Jan-1951, 66 y.o.   MRN: [id number]    Referring Physician(s): Dr. [name]  Supervising Physician: Corrie Mckusick  Patient Status: [facility] - In-pt  Chief Complaint: Intra-abdominal fluid collection  Subjective: Patient feels ok today.  Thinks he may get to go home if he has a BM.  Allergies: Patient has no known allergies.  Medications: Prior to Admission medications   Medication Sig Start Date End Date Taking? Authorizing Provider  amoxicillin-clavulanate (AUGMENTIN) 875-125 MG tablet Take 1 tablet by mouth 2 (two) times daily. 07/13/16 07/27/16 Yes [name], MD  aspirin EC 81 MG tablet Take 81 mg by mouth daily after supper.    Yes Historical Provider, MD  diclofenac sodium (VOLTAREN) 1 % GEL Apply 1 application topically 2 (two) times daily as needed (back and shoulder pain).   Yes Historical Provider, MD  famotidine (PEPCID) 20 MG tablet Take 1 tablet (20 mg total) by mouth 2 (two) times daily. 11/09/15  Yes Elwin Mocha, MD  ferrous sulfate 325 (65 FE) MG tablet Take 325 mg by mouth daily.   Yes Historical Provider, MD  HYDROcodone-acetaminophen (NORCO/VICODIN) 5-325 MG tablet Take 1-2 tablets by mouth every 4 (four) hours as needed for moderate pain. 07/13/16  Yes [name], MD  Multiple Vitamin (MULTIVITAMIN WITH MINERALS) TABS tablet Take 1 tablet by mouth daily.   Yes Historical Provider, MD  Nutritional Supplements (FEEDING SUPPLEMENT, OSMOLITE 1.5 CAL,) LIQD Place 1,000 mLs into feeding tube continuous.   Yes Historical Provider, MD  Omega-3 Fatty Acids (FISH OIL) 1000 MG CPDR Take 1,000 mg by mouth daily.   Yes Historical Provider, MD  Probiotic Product (ALIGN) 4 MG CAPS Take 1 capsule by mouth daily.   Yes Historical Provider, MD  tamsulosin (FLOMAX) 0.4 MG CAPS capsule Take 1 capsule (0.4 mg total) by mouth daily. 01/04/16  Yes Fanny Skates, MD  traMADol (ULTRAM) 50 MG tablet Take 50 mg by mouth every 6 (six)  hours as needed for moderate pain.  05/21/16  Yes Historical Provider, MD  zolpidem (AMBIEN) 10 MG tablet Take 5-10 mg by mouth at bedtime as needed and may repeat dose one time if needed for sleep.    Yes Historical Provider, MD  acetaminophen (TYLENOL) 500 MG tablet Take 1 tablet (500 mg total) by mouth every 8 (eight) hours as needed for mild pain (for pain). 12/05/15   Jill Alexanders, PA-C    Vital Signs: BP 114/66 (BP Location: Left Arm)   Pulse (!) 102   Temp 98.3 F (36.8 C) (Oral)   Resp 17   Ht 5\' 3"  (1.6 m)   Wt 139 lb 8.8 oz (63.3 kg)   SpO2 98%   BMI 24.72 kg/m   Physical Exam: abd: soft, LUQ drain still with minimal serous drainage, 10cc/24hrs.  Midline drain with tan milky drainage, 20cc/24hrs.  New posterior drain with minimal serous drainage as well, 5cc/24hrs.  All sites are c/d/I.  Imaging: Ct Image Guided Drainage By Percutaneous Catheter  Result Date: 07/18/2016 INDICATION: 66 year old male with a history of abdominal abscess. EXAM: CT GUIDED DRAINAGE OF PELVIC ABSCESS MEDICATIONS: The patient is currently admitted to the hospital and receiving intravenous antibiotics. The antibiotics were administered within an appropriate time frame prior to the initiation of the procedure. ANESTHESIA/SEDATION: 2.0 mg IV Versed 100 mcg IV Fentanyl Moderate Sedation Time:  16 The patient was continuously monitored during the procedure by the interventional radiology nurse under my direct supervision.  COMPLICATIONS: None TECHNIQUE: Informed written consent was obtained from the patient after a thorough discussion of the procedural risks, benefits and alternatives. All questions were addressed. Maximal Sterile Barrier Technique was utilized including caps, mask, sterile gowns, sterile gloves, sterile drape, hand hygiene and skin antiseptic. A timeout was performed prior to the initiation of the procedure. PROCEDURE: Patient is position prone. Scout CT was performed. The posterior right  abdomen was prepped with chlorhexidine in a sterile fashion, and a sterile drape was applied covering the operative field. A sterile gown and sterile gloves were used for the procedure. Local anesthesia was provided with 1% Lidocaine. 1% lidocaine was used for local anesthesia. Using CT guidance, 18 gauge trocar needle was advanced into the complex fluid collection of the pelvis. Using modified Seldinger technique and soft tissue dilation, a 10 French drain was placed into the fluid collection within the pelvis. Purulent fluid was aspirated with approximately 100 cc removed. Sample was sent to the lab. Catheter was sutured in position. Final image was stored. Catheter was attached to gravity drainage. Patient tolerated the procedure well and remained hemodynamically stable throughout. No complications were encountered and no significant blood loss. FINDINGS: Scout images demonstrate fluid collection within the pelvis. Final image demonstrates placement of 10 French pigtail catheter into the collection with near complete decompression. IMPRESSION: Status post CT-guided drainage of pelvic fluid collection. Sample was sent to the lab. Signed, Dulcy Fanny. Earleen Newport, DO [facility] Radiology Electronically Signed   By: Corrie Mckusick D.O.   On: 07/18/2016 09:02    Labs:  CBC:  Recent Labs  07/10/16 0945 07/15/16 1853 07/18/16 1156 07/19/16 0343  WBC 13.3* 8.8 13.0* 13.0*  HGB 9.5* 8.9* 9.7* 9.5*  HCT 29.6* 26.8* 30.2* 29.6*  PLT 582* 639* 665* 649*    COAGS:  Recent Labs  10/29/15 1851 10/30/15 1245 10/30/15 1636 11/06/15 0453 06/30/16 1605 07/06/16 1053  INR 1.28 1.42 1.34 1.38 1.29 1.15  APTT 29 27 25   --  32  --     BMP:  Recent Labs  07/10/16 0945 07/15/16 1853 07/18/16 1156 07/19/16 0343  NA 132* 131* 128* 131*  K 4.4 4.1 4.0 3.9  CL 97* 96* 95* 97*  CO2 28 24 27 27   GLUCOSE 162* 101* 123* 118*  BUN 13 9 10 11   CALCIUM 7.1* 7.8*  7.4* 7.6*  CREATININE 0.80 0.75 0.80 0.79  GFRNONAA >60 >60 >60 >60  GFRAA >60 >60 >60 >60    LIVER FUNCTION TESTS:  Recent Labs  12/22/15 1234 12/25/15 1736 12/26/15 0636 07/15/16 1853  BILITOT 0.6 0.5 0.8 0.5  AST 21 26 17 15   ALT 21 21 16* 19  ALKPHOS 105 108 87 76  PROT 5.8* 6.6 5.4* 5.2*  ALBUMIN 2.2* 2.6* 2.1* 1.7*    Assessment and Plan: 1. s/p multiple drain placements for intra-abdominal abscesses  -newest drain placed on 4/21 growing enterococcus, same as prior drains. -patient has a drain clinic appt already set up for 5/1 with a CT and clinic visit.  We will have him keep this visit as that will be good timing for a repeat CT scan and follow up. -his wife should continue flushing all of his drains with 5-10cc of NS daily at home. Electronically Signed: Henreitta Cea 07/20/2016, 11:48 AM   I spent a total of 15 Minutes at the the patient's bedside AND on the patient's hospital floor or unit, greater than 50% of which was counseling/coordinating care for intra-abdominal  abscesses

## 2016-07-20 NOTE — Consult Note (Signed)
Reason for Consult:  Right knee pain and swelling Referring Physician: Dr. Douglas , CCS  Miguel [name] is an 65 y.o. male.  HPI:   65 yo male who I have seen before recently hospitalized by the General Surgery service.  Note right knee pain yesterday and swelling today.  He says his pain is worse with weight bearing and trying to bend his knee.  Ortho was consulted to further assess his right knee.  He has been seen for this kee quite some time ago.  Past Medical History:  Diagnosis Date  . Anemia   . Anxiety   . BPH (benign prostatic hyperplasia)   . Gastritis   . GERD (gastroesophageal reflux disease)    "seldom" (07/16/2016)  . GI bleed due to NSAIDs 10/27/2015  . History of blood transfusion 06/2016   post OR/notes 07/15/2016  . History of hiatal hernia   . History of kidney stones   . Hyperlipidemia   . Hypertension   . Melanoma of back (HCC)    "mid back"  . Sigmoid diverticulitis    with perforation    Past Surgical History:  Procedure Laterality Date  . COLON SURGERY     sigmoid  . COLOSTOMY TAKEDOWN N/A 06/28/2016   Procedure: COLOSTOMY TAKEDOWN;  Surgeon: Douglas , MD;  Location: [facility] OR;  Service: General;  Laterality: N/A;  . ESOPHAGOGASTRODUODENOSCOPY N/A 10/27/2015   Procedure: ESOPHAGOGASTRODUODENOSCOPY (EGD);  Surgeon: Marc Magod, MD;  Location: [facility] ENDOSCOPY;  Service: Endoscopy;  Laterality: N/A;  . ESOPHAGOGASTRODUODENOSCOPY N/A 10/29/2015   Procedure: ESOPHAGOGASTRODUODENOSCOPY (EGD);  Surgeon: Robert Buccini, MD;  Location: [facility] ENDOSCOPY;  Service: Endoscopy;  Laterality: N/A;  . ESOPHAGOGASTRODUODENOSCOPY N/A 10/30/2015   Procedure: ESOPHAGOGASTRODUODENOSCOPY (EGD);  Surgeon: Marc Magod, MD;  Location: [facility] ENDOSCOPY;  Service: Endoscopy;  Laterality: N/A;  . GASTROSTOMY TUBE PLACEMENT  11/21/2015   REDUCTION OF HIATAL HERNIA , REPAIR HIATAL HERNIA, RESECTION SMALL BOWEL WITH ANASTOMOSIS, PLACEMENT GASTROSTOMY TUBE, PLACEMENT DUODENOSTOMY TUBE (N/A)   . HEMORRHOID BANDING  X 2  . HERNIA REPAIR    . HIATAL HERNIA REPAIR N/A 11/21/2015   Procedure: REDUCTION OF HIATAL HERNIA , REPAIR HIATAL HERNIA, RESECTION SMALL BOWEL WITH ANASTOMOSIS, PLACEMENT GASTROSTOMY TUBE, PLACEMENT DUODENOSTOMY TUBE;  Surgeon: Luke Aaron Kinsinger, MD;  Location: [facility] OR;  Service: General;  Laterality: N/A;  . INCISIONAL HERNIA REPAIR  06/28/2016   open/notes 07/15/2016  . IR GUIDED DRAIN W CATHETER PLACEMENT  07/06/2016   /NOTES 07/15/2016  . KNEE CARTILAGE SURGERY Right 1971   "opened me up"  . LAPAROTOMY N/A 07/05/2015   Procedure: PARTIAL SIGMOID COLECTOMY AND COLOSTOMY;  Surgeon: Douglas , MD;  Location: [facility] OR;  Service: General;  Laterality: N/A;  . MELANOMA EXCISION  2001  . 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 REPAIR OF PERFORATED ULCER N/A 10/30/2015   Procedure: REPAIR OF BLEEDING  ULCER;  Surgeon: James Wyatt, MD;  Location: [facility] OR;  Service: General;  Laterality: N/A;  . TUMOR EXCISION  2009   "back; fatty tumor"    Family History  Problem Relation Age of Onset  . Stroke Mother   . Stroke Brother   . Heart disease Brother     Social History:  reports that he has never smoked. He quit smokeless tobacco use 7 days ago. His smokeless tobacco use included Snuff. He reports that he does not   drink alcohol or use drugs.  Allergies: No Known Allergies  Medications: I have reviewed the patient's current medications.  Results for orders placed or performed during the hospital encounter of 07/15/16 (from the past 48 hour(s))  Glucose, capillary     Status: Abnormal   Collection Time: 07/18/16  8:03 AM  Result Value Ref Range   Glucose-Capillary 125 (H) 65 - 99 mg/dL  CBC     Status: Abnormal   Collection Time: 07/18/16 11:56 AM  Result Value Ref Range   WBC 13.0 (H) 4.0 - 10.5 K/uL   RBC 3.42 (L) 4.22 - 5.81  MIL/uL   Hemoglobin 9.7 (L) 13.0 - 17.0 g/dL   HCT 30.2 (L) 39.0 - 52.0 %   MCV 88.3 78.0 - 100.0 fL   MCH 28.4 26.0 - 34.0 pg   MCHC 32.1 30.0 - 36.0 g/dL   RDW 14.0 11.5 - 15.5 %   Platelets 665 (H) 150 - 400 K/uL  Basic metabolic panel     Status: Abnormal   Collection Time: 07/18/16 11:56 AM  Result Value Ref Range   Sodium 128 (L) 135 - 145 mmol/L   Potassium 4.0 3.5 - 5.1 mmol/L   Chloride 95 (L) 101 - 111 mmol/L   CO2 27 22 - 32 mmol/L   Glucose, Bld 123 (H) 65 - 99 mg/dL   BUN 10 6 - 20 mg/dL   Creatinine, Ser 0.80 0.61 - 1.24 mg/dL   Calcium 7.4 (L) 8.9 - 10.3 mg/dL   GFR calc non Af Amer >60 >60 mL/min   GFR calc Af Amer >60 >60 mL/min    Comment: (NOTE) The eGFR has been calculated using the CKD EPI equation. This calculation has not been validated in all clinical situations. eGFR's persistently <60 mL/min signify possible Chronic Kidney Disease.    Anion gap 6 5 - 15  Glucose, capillary     Status: Abnormal   Collection Time: 07/18/16 12:17 PM  Result Value Ref Range   Glucose-Capillary 131 (H) 65 - 99 mg/dL  Glucose, capillary     Status: Abnormal   Collection Time: 07/18/16  4:53 PM  Result Value Ref Range   Glucose-Capillary 116 (H) 65 - 99 mg/dL  Glucose, capillary     Status: Abnormal   Collection Time: 07/18/16  8:21 PM  Result Value Ref Range   Glucose-Capillary 110 (H) 65 - 99 mg/dL  Glucose, capillary     Status: None   Collection Time: 07/19/16 12:08 AM  Result Value Ref Range   Glucose-Capillary 94 65 - 99 mg/dL  Basic metabolic panel     Status: Abnormal   Collection Time: 07/19/16  3:43 AM  Result Value Ref Range   Sodium 131 (L) 135 - 145 mmol/L   Potassium 3.9 3.5 - 5.1 mmol/L   Chloride 97 (L) 101 - 111 mmol/L   CO2 27 22 - 32 mmol/L   Glucose, Bld 118 (H) 65 - 99 mg/dL   BUN 11 6 - 20 mg/dL   Creatinine, Ser 0.79 0.61 - 1.24 mg/dL   Calcium 7.6 (L) 8.9 - 10.3 mg/dL   GFR calc non Af Amer >60 >60 mL/min   GFR calc Af Amer >60 >60  mL/min    Comment: (NOTE) The eGFR has been calculated using the CKD EPI equation. This calculation has not been validated in all clinical situations. eGFR's persistently <60 mL/min signify possible Chronic Kidney Disease.    Anion gap 7 5 - 15  CBC  Status: Abnormal   Collection Time: 07/19/16  3:43 AM  Result Value Ref Range   WBC 13.0 (H) 4.0 - 10.5 K/uL   RBC 3.33 (L) 4.22 - 5.81 MIL/uL   Hemoglobin 9.5 (L) 13.0 - 17.0 g/dL   HCT 29.6 (L) 39.0 - 52.0 %   MCV 88.9 78.0 - 100.0 fL   MCH 28.5 26.0 - 34.0 pg   MCHC 32.1 30.0 - 36.0 g/dL   RDW 14.1 11.5 - 15.5 %   Platelets 649 (H) 150 - 400 K/uL  Glucose, capillary     Status: Abnormal   Collection Time: 07/19/16  5:21 AM  Result Value Ref Range   Glucose-Capillary 117 (H) 65 - 99 mg/dL  Glucose, capillary     Status: Abnormal   Collection Time: 07/19/16  7:55 AM  Result Value Ref Range   Glucose-Capillary 116 (H) 65 - 99 mg/dL   Comment 1 Notify RN   Glucose, capillary     Status: Abnormal   Collection Time: 07/19/16 12:11 PM  Result Value Ref Range   Glucose-Capillary 117 (H) 65 - 99 mg/dL   Comment 1 Notify RN   Glucose, capillary     Status: Abnormal   Collection Time: 07/19/16  5:34 PM  Result Value Ref Range   Glucose-Capillary 135 (H) 65 - 99 mg/dL  Glucose, capillary     Status: Abnormal   Collection Time: 07/19/16  8:13 PM  Result Value Ref Range   Glucose-Capillary 152 (H) 65 - 99 mg/dL  Glucose, capillary     Status: Abnormal   Collection Time: 07/19/16 11:27 PM  Result Value Ref Range   Glucose-Capillary 133 (H) 65 - 99 mg/dL  Glucose, capillary     Status: Abnormal   Collection Time: 07/20/16  4:04 AM  Result Value Ref Range   Glucose-Capillary 134 (H) 65 - 99 mg/dL  Glucose, capillary     Status: Abnormal   Collection Time: 07/20/16  7:31 AM  Result Value Ref Range   Glucose-Capillary 123 (H) 65 - 99 mg/dL   Comment 1 Notify RN     No results found.  ROS Blood pressure 114/66, pulse (!)  102, temperature 98.3 F (36.8 C), temperature source Oral, resp. rate 17, height 5' 3" (1.6 m), weight 139 lb 8.8 oz (63.3 kg), SpO2 98 %. Physical Exam  Constitutional: He is oriented to person, place, and time. He appears well-developed and well-nourished.  Musculoskeletal:       Right knee: He exhibits decreased range of motion and effusion.  Neurological: He is alert and oriented to person, place, and time.   There is a large joint effusion involving his right knee, but no redness and this does not appear to be infected.   Assessment/Plan: Right knee pain and large effusion  1)  I was able to easily aspirate 60 cc of fluid off of his knee that appear consistent with arthritis or an inflammatory arthritis.  He tolerated this well.  If he is still her this afternoon, I'll come by and place a steroid injection in his knee.  Mcarthur Rossetti 07/20/2016, 7:44 AM

## 2016-07-20 NOTE — Progress Notes (Signed)
  Subjective: Tolerating tube feeds No nausea Passing flatus but still no BM  Objective: Vital signs in last 24 hours: Temp:  [98.3 F (36.8 C)-99 F (37.2 C)] 98.3 F (36.8 C) (04/24 0405) Pulse Rate:  [102-112] 102 (04/24 0405) Resp:  [17-19] 17 (04/24 0405) BP: (114-116)/(63-69) 114/66 (04/24 0405) SpO2:  [97 %-98 %] 98 % (04/24 0405) Weight:  [63.3 kg (139 lb 8.8 oz)] 63.3 kg (139 lb 8.8 oz) (04/24 0405) Last BM Date: 07/15/16  Intake/Output from previous day: 04/23 0701 - 04/24 0700 In: 1333.7 [I.V.:950; NG/GT:333.7; IV Piggyback:50] Out: 3428 [Urine:1450; Drains:35] Intake/Output this shift: No intake/output data recorded.  Exam: Abdomen soft, almost non-tender Drains about the same  Lab Results:   Recent Labs  07/18/16 1156 07/19/16 0343  WBC 13.0* 13.0*  HGB 9.7* 9.5*  HCT 30.2* 29.6*  PLT 665* 649*   BMET  Recent Labs  07/18/16 1156 07/19/16 0343  NA 128* 131*  K 4.0 3.9  CL 95* 97*  CO2 27 27  GLUCOSE 123* 118*  BUN 10 11  CREATININE 0.80 0.79  CALCIUM 7.4* 7.6*   PT/INR No results for input(s): LABPROT, INR in the last 72 hours. ABG No results for input(s): PHART, HCO3 in the last 72 hours.  Invalid input(s): PCO2, PO2  Studies/Results: No results found.  Anti-infectives: Anti-infectives    Start     Dose/Rate Route Frequency Ordered Stop   07/15/16 1800  Ampicillin-Sulbactam (UNASYN) 3 g in sodium chloride 0.9 % 100 mL IVPB     3 g 200 mL/hr over 30 Minutes Intravenous Every 6 hours 07/15/16 1703        Assessment/Plan: Intra-abdominal abscesses post op  Clinically doing well.  I want him to have a BM prior to discharge Continuing IV antibiotics Appreciate Orthopedic Surg consult   LOS: 4 days    Ilina Xu A 07/20/2016

## 2016-07-20 NOTE — Consult Note (Signed)
   [facility] CM Inpatient Consult   07/20/2016  Miguel [name] [birth year] [id number]    Came to visit Miguel [name] at bedside. He is well known to Probation officer as he is followed periodically telephonically post hospitalizations as a benefit of [place] to Wellness program for [place] employees/dependents with [facility] insurance.   Miguel [name] states he is now feeling better. States he just had a BM. States he anticipates that he will be discharging soon. Miguel [name] is being treated for intra-abdominal abscesses post op. Miguel [name] also indicates he had to have some fluid drawn from his knee.  He remains agreeable to post hospital discharge call. Appreciative of visit. Made inpatient RNCM aware that writer is following.    [name], MSN-Ed, RN,BSN [facility] Liaison [phone]

## 2016-07-20 NOTE — Evaluation (Signed)
Physical Therapy Evaluation Patient Details Name: Miguel [name] MRN: [id number] DOB: 1950-11-16 Today's Date: 07/20/2016   History of Present Illness  66 y.o. male admitted to [facility] on 07/15/16 for inta abdominal abcess.  Pt wiht three drains and a g-tube.  He also had his right knee aspirated and a steroid injection this admission due to pain and swelling.  Pt with significant PMHx of sigmoid diverticulitis s/p perforation and surgery, colostomy and colostomy take down, HTN, and GIB s/p blood transfusions.  Clinical Impression  Pt walked the entire unit first, with RW then with just IV pole and very light hand held assist.  Moderately antalgic gait pattern due to right knee pain and stiffness that progressed to mildly antalgic pattern by the end of our walk.  Gentle R leg HEP program given and pt encouraged to not ball up a pillow under his knee due to risk of knee contracture.   PT to follow acutely for deficits listed below.     Follow Up Recommendations No PT follow up    Equipment Recommendations  None recommended by PT    Recommendations for Other Services   NA    Precautions / Restrictions Precautions Precaution Comments: Educated pt re: why putting a balled up pillow under his knee is not a good idea.   Required Braces or Orthoses: Other Brace/Splint Other Brace/Splint: plurex catheter drain and 2 JP drains Restrictions Weight Bearing Restrictions: No      Mobility  Bed Mobility Overal bed mobility: Needs Assistance Bed Mobility: Rolling;Sidelying to Sit Rolling: Min guard Sidelying to sit: Min guard       General bed mobility comments: Min guard assist for safety and to help manage drains  Transfers Overall transfer level: Needs assistance Equipment used: Rolling walker (2 wheeled);None Transfers: Sit to/from Stand Sit to Stand: Min guard         General transfer comment: Sit to stand min guard assist with RW, did not use RW second half of walk, so pt went to  sitting with use of hands only to control descent to sit.   Ambulation/Gait Ambulation/Gait assistance: Min guard Ambulation Distance (Feet): 510 Feet Assistive device: Rolling walker (2 wheeled) Gait Pattern/deviations: Step-through pattern;Shuffle;Antalgic;Trunk flexed Gait velocity: decreased Gait velocity interpretation: Below normal speed for age/gender General Gait Details: Pt started with moderately antalgic gait pattern and RW, however he reports he does not use RW at baseline, so after his gait pattern improved we switched to holding the IV pole and very light hand held assist.           Balance Overall balance assessment: Needs assistance Sitting-balance support: Feet supported;No upper extremity supported Sitting balance-Leahy Scale: Good     Standing balance support: Bilateral upper extremity supported;No upper extremity supported;Single extremity supported Standing balance-Leahy Scale: Fair                               Pertinent Vitals/Pain Pain Assessment: 0-10 Pain Score: 4  Pain Location: right knee Pain Descriptors / Indicators: Aching;Burning Pain Intervention(s): Limited activity within patient's tolerance;Monitored during session;Repositioned    Home Living Family/patient expects to be discharged to:: Private residence Living Arrangements: Spouse/significant other;Other relatives (daughter and grandson are there during the day) Available Help at Discharge: Family;Available 24 hours/day Type of Home: House Home Access: Stairs to enter Entrance Stairs-Rails: None Entrance Stairs-Number of Steps: 1 Home Layout: One level Home Equipment: Cane - single point  Prior Function Level of Independence: Independent               Hand Dominance   Dominant Hand: Right    Extremity/Trunk Assessment   Upper Extremity Assessment Upper Extremity Assessment: Generalized weakness    Lower Extremity Assessment Lower Extremity  Assessment: RLE deficits/detail RLE Deficits / Details: right knee with limited flexion and extension ROM and antalgic gait pattern.  Grossly 3-/5 strength and lacking ~15 degrees of full extension and able to get to 90 degress of flexion.  RLE Sensation:  (intact to LT)    Cervical / Trunk Assessment Cervical / Trunk Assessment: Kyphotic  Communication   Communication: No difficulties  Cognition Arousal/Alertness: Awake/alert Behavior During Therapy: WFL for tasks assessed/performed Overall Cognitive Status: Within Functional Limits for tasks assessed                                           Exercises General Exercises - Lower Extremity Quad Sets: AROM;Right;10 reps Long Arc Quad: AROM;Right;10 reps Heel Slides: AROM;Right;10 reps Other Exercises Other Exercises: HEP handout of the above listed exercises given.    Assessment/Plan    PT Assessment Patient needs continued PT services  PT Problem List Decreased range of motion;Decreased strength;Decreased activity tolerance;Decreased balance;Decreased mobility;Decreased knowledge of use of DME;Pain       PT Treatment Interventions DME instruction;Gait training;Stair training;Functional mobility training;Therapeutic activities;Therapeutic exercise;Balance training;Patient/family education;Modalities;Manual techniques    PT Goals (Current goals can be found in the Care Plan section)  Acute Rehab PT Goals Patient Stated Goal: to go home and avoid coming back to the hospital PT Goal Formulation: With patient Time For Goal Achievement: 08/03/16 Potential to Achieve Goals: Good    Frequency Min 3X/week    End of Session Equipment Utilized During Treatment: Gait belt Activity Tolerance: Patient limited by pain;Patient limited by fatigue Patient left: in chair;with call bell/phone within reach   PT Visit Diagnosis: Muscle weakness (generalized) (M62.81);Difficulty in walking, not elsewhere classified  (R26.2);Pain Pain - Right/Left: Right Pain - part of body: Knee    Time: 1523-1600 PT Time Calculation (min) (ACUTE ONLY): 37 min   Charges:      Wells Guiles B. Conroy Goracke, PT, DPT [phone]   PT Evaluation $PT Eval Moderate Complexity: 1 Procedure PT Treatments $Gait Training: 8-22 mins   07/20/2016, 11:56 PM

## 2016-07-21 ENCOUNTER — Other Ambulatory Visit: Payer: Medicare Other

## 2016-07-21 ENCOUNTER — Ambulatory Visit (INDEPENDENT_AMBULATORY_CARE_PROVIDER_SITE_OTHER): Payer: 59 | Admitting: Orthopaedic Surgery

## 2016-07-21 LAB — GLUCOSE, CAPILLARY
GLUCOSE-CAPILLARY: 142 mg/dL — AB (ref 65–99)
GLUCOSE-CAPILLARY: 145 mg/dL — AB (ref 65–99)
GLUCOSE-CAPILLARY: 152 mg/dL — AB (ref 65–99)

## 2016-07-21 MED ORDER — SODIUM CHLORIDE 0.9% FLUSH: 10.0000 mL | Freq: Every day | INTRAVENOUS | 0 refills | Status: DC

## 2016-07-21 NOTE — Progress Notes (Signed)
SWAT nurse on floor. Changed patient dressing and administered pain medicine prn.

## 2016-07-21 NOTE — Progress Notes (Signed)
Discharge home. Home discharge instruction given, HHNeeds was set up with advance. No question verbalized.

## 2016-07-21 NOTE — Discharge Summary (Signed)
Physician Discharge Summary  Patient ID: Miguel [name] MRN: [id number] DOB/AGE: [birth year] 66 y.o.  Admit date: 07/15/2016 Discharge date: 07/21/2016  Admission Diagnoses:  Discharge Diagnoses:  Active Problems:   Cellulitis   Intra-abdominal abscess (HCC)   Malnutrition of moderate degree wound infection  Discharged Condition: good  Hospital Course: admitted with post op wound infection.  Already had drains in intra-abd abscesses.  Follow-up CT showed last abscess now could be drained so IR placed 3rd drain.  Patient complained of knee pain on right leg while ambulating so Orthopedic Surgery was asked to see patient.  His knee was aspirated and steroids where injected.  By discharge, he was afebrile, ambulating well, tolerating tube feeds, and having BM's  Consults: IR, Orthopedic Surgery  Significant Diagnostic Studies:   Treatments: Antibiotics, drain  Discharge Exam: Blood pressure 118/63, pulse 98, temperature 98 F (36.7 C), temperature source Oral, resp. rate 19, height 5\' 3"  (1.6 m), weight 58.9 kg (129 lb 13.6 oz), SpO2 97 %. General appearance: alert, cooperative and no distress Resp: clear to auscultation bilaterally Cardio: regular rate and rhythm, S1, S2 normal, no murmur, click, rub or gallop Incision/Wound: incisions clean, wound healing, drains serosang  Disposition: 06-Home-Health Care Svc  Discharge Instructions    Discharge patient    Complete by:  As directed    Discharge disposition:  01-Home or Self Care   Discharge patient date:  07/21/2016     Allergies as of 07/21/2016   No Known Allergies     Medication List    TAKE these medications   acetaminophen 500 MG tablet Commonly known as:  TYLENOL Take 1 tablet (500 mg total) by mouth every 8 (eight) hours as needed for mild pain (for pain).   ALIGN 4 MG Caps Take 1 capsule by mouth daily.   amoxicillin-clavulanate 875-125 MG tablet Commonly known as:  AUGMENTIN Take 1 tablet by mouth 2  (two) times daily.   aspirin EC 81 MG tablet Take 81 mg by mouth daily after supper.   diclofenac sodium 1 % Gel Commonly known as:  VOLTAREN Apply 1 application topically 2 (two) times daily as needed (back and shoulder pain).   famotidine 20 MG tablet Commonly known as:  PEPCID Take 1 tablet (20 mg total) by mouth 2 (two) times daily.   feeding supplement (OSMOLITE 1.5 CAL) Liqd Place 1,000 mLs into feeding tube continuous.   ferrous sulfate 325 (65 FE) MG tablet Take 325 mg by mouth daily.   Fish Oil 1000 MG Cpdr Take 1,000 mg by mouth daily.   HYDROcodone-acetaminophen 5-325 MG tablet Commonly known as:  NORCO/VICODIN Take 1-2 tablets by mouth every 4 (four) hours as needed for moderate pain.   multivitamin with minerals Tabs tablet Take 1 tablet by mouth daily.   tamsulosin 0.4 MG Caps capsule Commonly known as:  FLOMAX Take 1 capsule (0.4 mg total) by mouth daily.   traMADol 50 MG tablet Commonly known as:  ULTRAM Take 50 mg by mouth every 6 (six) hours as needed for moderate pain.   zolpidem 10 MG tablet Commonly known as:  AMBIEN Take 5-10 mg by mouth at bedtime as needed and may repeat dose one time if needed for sleep.      Follow-up Information    Albert Devaul A, MD. Schedule an appointment as soon as possible for a visit on 07/28/2016.   Specialty:  General Surgery Contact information: [address] [place] [address] [phone]  SignedCoralie Keens A 07/21/2016, 7:09 AM

## 2016-07-21 NOTE — Progress Notes (Signed)
Contacted [facility] Liaison to make aware of dc home today with [facility] RN. Jonnie Finner RN CCM Case Mgmt phone [phone]

## 2016-07-21 NOTE — Discharge Instructions (Signed)
°  Continue tube feeds and medications including Augmentin  Keep appointment with Radiology Drain clinic on 5/1

## 2016-07-21 NOTE — Progress Notes (Signed)
Patient ID: Miguel [name], male   DOB: [birth year], 66 y.o.   MRN: [id number]  Doing well Ambulating Tolerating tube feeds Abdomen soft  Had BM  Plan: Discharge

## 2016-07-22 ENCOUNTER — Other Ambulatory Visit: Payer: Self-pay | Admitting: *Deleted

## 2016-07-22 ENCOUNTER — Other Ambulatory Visit: Payer: Medicare Other

## 2016-07-22 DIAGNOSIS — T814XXA Infection following a procedure, initial encounter: Secondary | ICD-10-CM | POA: Diagnosis not present

## 2016-07-22 DIAGNOSIS — I10 Essential (primary) hypertension: Secondary | ICD-10-CM | POA: Diagnosis not present

## 2016-07-22 DIAGNOSIS — N400 Enlarged prostate without lower urinary tract symptoms: Secondary | ICD-10-CM | POA: Diagnosis not present

## 2016-07-22 DIAGNOSIS — Z7982 Long term (current) use of aspirin: Secondary | ICD-10-CM | POA: Diagnosis not present

## 2016-07-22 DIAGNOSIS — Z87442 Personal history of urinary calculi: Secondary | ICD-10-CM | POA: Diagnosis not present

## 2016-07-22 DIAGNOSIS — F419 Anxiety disorder, unspecified: Secondary | ICD-10-CM | POA: Diagnosis not present

## 2016-07-22 DIAGNOSIS — Z431 Encounter for attention to gastrostomy: Secondary | ICD-10-CM | POA: Diagnosis not present

## 2016-07-22 DIAGNOSIS — E440 Moderate protein-calorie malnutrition: Secondary | ICD-10-CM | POA: Diagnosis not present

## 2016-07-22 DIAGNOSIS — K5730 Diverticulosis of large intestine without perforation or abscess without bleeding: Secondary | ICD-10-CM | POA: Diagnosis not present

## 2016-07-22 DIAGNOSIS — D649 Anemia, unspecified: Secondary | ICD-10-CM | POA: Diagnosis not present

## 2016-07-22 DIAGNOSIS — E785 Hyperlipidemia, unspecified: Secondary | ICD-10-CM | POA: Diagnosis not present

## 2016-07-22 DIAGNOSIS — Z85820 Personal history of malignant melanoma of skin: Secondary | ICD-10-CM | POA: Diagnosis not present

## 2016-07-22 MED FILL — TAMSULOSIN HCL 0.4 MG CAP: 0.4 | 30 days supply | Qty: 30 | Fill #2

## 2016-07-22 MED FILL — FAMOTIDINE 20 MG TABLET: 20 | 30 days supply | Qty: 120 | Fill #1

## 2016-07-22 NOTE — Patient Outreach (Signed)
[facility]) Care Management  07/22/2016  Miguel [name] 01/16/1951 [id number]   Subjective: Telephone call to patient's wife mobile number, per previous verbal consent, no answer, left HIPAA compliant voicemail message, and requested call back.    Objective: Per chart review: Patient hospitalized 07/15/16 -07/21/16 for Intra-abdominal abscess, Cellulitis, Malnutrition of moderate degree, and wound infection.   Patient hospitalized 06/28/16 -07/13/16 for colostomy takedown.  Status post  COLOSTOMY TAKEDOWN,  SEGMENTAL COLECTOMY, and INCISIONAL HERNIA REPAIR on 06/28/16.   Patient hospitalized 12/25/15 - 01/03/16 for fever, difficulty urinating, and abdominal pain. Patient hospitalized 12/12/15 - 9/28/17for dehydration and malnutrition. Patient hospitalized 11/13/15 -12/05/15 with Postoperative fever, acute kidney injury, severe protein malnutrition, and sepsis. Status post Open reduction of hiatal hernia repair of hiatal hernia, resection of small bowel with anastomosis,placement of gastrostomy tube, placement of duodenostomy tube on 11/21/15.  Patient hospitalized 10/27/15 - 11/09/15 for GI bleed, hiatal hernia, acute blood loss anemia, and gastric ulcer. Patient has a history of diverticulosis with perforation, hyperlipidemia, and chronic kidney disease. Patient status post REPAIR OF BLEEDING ULCER PARTIAL DISTAL GASTRECTOMY WITH BILROTH II RECONSTRUCTION/GASTROJEJUNOSTOMY on 10/30/15.     Assessment: Received [facility] Transition of care referral on 06/30/16. Transition of care follow up pending patient contact.    Plan:RNCM will call patient for 2nd telephone outreach attempt, transition of care follow up, within 10 business days if no return call.    Miguel [name], BSN, [place] Management [facility] Telephonic CM Phone: [phone] Fax: [phone]

## 2016-07-23 ENCOUNTER — Other Ambulatory Visit: Payer: Medicare Other | Admitting: *Deleted

## 2016-07-23 ENCOUNTER — Encounter: Payer: Self-pay | Admitting: *Deleted

## 2016-07-23 ENCOUNTER — Other Ambulatory Visit: Payer: Self-pay | Admitting: *Deleted

## 2016-07-23 DIAGNOSIS — T814XXA Infection following a procedure, initial encounter: Secondary | ICD-10-CM | POA: Diagnosis not present

## 2016-07-23 DIAGNOSIS — Z7982 Long term (current) use of aspirin: Secondary | ICD-10-CM | POA: Diagnosis not present

## 2016-07-23 DIAGNOSIS — Z431 Encounter for attention to gastrostomy: Secondary | ICD-10-CM | POA: Diagnosis not present

## 2016-07-23 DIAGNOSIS — Z85820 Personal history of malignant melanoma of skin: Secondary | ICD-10-CM | POA: Diagnosis not present

## 2016-07-23 DIAGNOSIS — Z87442 Personal history of urinary calculi: Secondary | ICD-10-CM | POA: Diagnosis not present

## 2016-07-23 DIAGNOSIS — E440 Moderate protein-calorie malnutrition: Secondary | ICD-10-CM | POA: Diagnosis not present

## 2016-07-23 LAB — AEROBIC/ANAEROBIC CULTURE (SURGICAL/DEEP WOUND)

## 2016-07-23 LAB — AEROBIC/ANAEROBIC CULTURE W GRAM STAIN (SURGICAL/DEEP WOUND)

## 2016-07-23 MED FILL — NORMAL SALINE FLUSH SYRINGE: 0.9 | 15 days supply | Qty: 150 | Fill #0

## 2016-07-23 NOTE — Patient Outreach (Signed)
[place] [place] [facility]) Care Management  07/23/2016  Miguel [name] [birth year] [id number]   Subjective: Telephone call from patient's wife, states she is returning call, and remembers speaking with RNCM in the past.  Discussed [facility] Care Management [facility] Transition of care follow up, patient wife, voiced understanding, and is in agreement to complete follow up on patient's behalf.   Patient is also in the room during this conversation and answers questions as needed.   Wife states patient is doing better, no fever,  does not have an appetite, still having some nausea since last hospital discharge, nausea better today,  continues to have evening tube feedings, tolerating small amounts of liquids by mouth, and has a follow up appointment with surgeon on 07/28/16.   RNCM advised wife to call MD's office if nausea does not improve, reviewed signs & symptoms of infections, symptoms to notify MD of, monitor if Augumentin is impactting nausea, wife voices understanding, and states she will follow up as needed.   Patient continues to receive home health RN for tube feeding monitoring and assessments as needed.  Wife states patient does not have any transition of care, care coordination, disease management, disease monitoring, transportation, community resource, or pharmacy needs at this time. States she is very appreciative of the follow up and is in agreement to receive [place] Management information.    Objective: Per chart review: Patient hospitalized 07/15/16 -07/21/16 for Intra-abdominal abscess, Cellulitis, Malnutrition of moderate degree, and wound infection. Patient hospitalized 06/28/16 -07/13/16 for colostomy takedown. Status post COLOSTOMY TAKEDOWN, SEGMENTAL COLECTOMY, and INCISIONAL HERNIA REPAIR on 06/28/16. Patient hospitalized 12/25/15 - 01/03/16 for fever, difficulty urinating, and abdominal pain. Patient hospitalized 12/12/15 - 9/28/17for dehydration and malnutrition. Patient  hospitalized 11/13/15 -12/05/15 with Postoperative fever, acute kidney injury, severe protein malnutrition, and sepsis. Status post Open reduction of hiatal hernia repair of hiatal hernia, resection of small bowel with anastomosis,placement of gastrostomy tube, placement of duodenostomy tube on 11/21/15.  Patient hospitalized 10/27/15 - 11/09/15 for GI bleed, hiatal hernia, acute blood loss anemia, and gastric ulcer. Patient has a history of diverticulosis with perforation, hyperlipidemia, and chronic kidney disease. Patient status post REPAIR OF BLEEDING ULCER PARTIAL DISTAL GASTRECTOMY WITH BILROTH II RECONSTRUCTION/GASTROJEJUNOSTOMY on 10/30/15.     Assessment: Received [facility]MR Transition of care referral on 06/30/16. Transition of care follow up completed, no care management needs, and will proceed with case closure.    Plan:RNCM will send patient successful outreach letter, [facility] pamphlet, and magnet. RNCM will send case closure due to follow up completed / no care management needs request to [facility].    Roxsana Riding H. Annia Friendly, BSN, [place] Management [facility] Telephonic CM Phone: [phone] Fax: [phone]

## 2016-07-23 NOTE — Patient Outreach (Signed)
[place] [facility]) Care Management  07/23/2016  Miguel [name] 1950/09/05 [id number]   Subjective: Received voicemail message from patient's wife Miguel [name]), and requested call back. Telephone call to patient's wife mobile number, per patient's previous verbal consent, no answer, left HIPAA compliant voicemail message, and requested call back.    Objective: Per chart review: Patient hospitalized 07/15/16 -07/21/16 for Intra-abdominal abscess, Cellulitis, Malnutrition of moderate degree, and wound infection. Patient hospitalized 06/28/16 -07/13/16 for colostomy takedown. Status post COLOSTOMY TAKEDOWN, SEGMENTAL COLECTOMY, and INCISIONAL HERNIA REPAIR on 06/28/16. Patient hospitalized 12/25/15 - 01/03/16 for fever, difficulty urinating, and abdominal pain. Patient hospitalized 12/12/15 - 9/28/17for dehydration and malnutrition. Patient hospitalized 11/13/15 -12/05/15 with Postoperative fever, acute kidney injury, severe protein malnutrition, and sepsis. Status post Open reduction of hiatal hernia repair of hiatal hernia, resection of small bowel with anastomosis,placement of gastrostomy tube, placement of duodenostomy tube on 11/21/15.  Patient hospitalized 10/27/15 - 11/09/15 for GI bleed, hiatal hernia, acute blood loss anemia, and gastric ulcer. Patient has a history of diverticulosis with perforation, hyperlipidemia, and chronic kidney disease. Patient status post REPAIR OF BLEEDING ULCER PARTIAL DISTAL GASTRECTOMY WITH BILROTH II RECONSTRUCTION/GASTROJEJUNOSTOMY on 10/30/15.     Assessment: Received UMR Transition of care referral on 06/30/16. Transition of care follow up pending patient contact.    Plan:RNCM will call patient for 3rd telephone outreach attempt, transition of care follow up, within 10 business days if no return call.    Lilia Letterman H. Annia Friendly, BSN, [place] Management [facility] Telephonic CM Phone: [phone] Fax: [phone]

## 2016-07-23 NOTE — Patient Outreach (Addendum)
[facility] [facility]) Care Management  07/23/2016  Miguel [name] 26-Apr-1950 [id number]   Subjective: Telephone call to patient's wife mobile number, per patient's previous verbal consent, no answer, left HIPAA compliant voicemail message, and requested call back.    Objective: Per chart review: Patient hospitalized 07/15/16 -07/21/16 for Intra-abdominal abscess, Cellulitis, Malnutrition of moderate degree, and wound infection.   Patient hospitalized 06/28/16 -07/13/16 for colostomy takedown.  Status post  COLOSTOMY TAKEDOWN,  SEGMENTAL COLECTOMY, and INCISIONAL HERNIA REPAIR on 06/28/16.   Patient hospitalized 12/25/15 - 01/03/16 for fever, difficulty urinating, and abdominal pain. Patient hospitalized 12/12/15 - 9/28/17for dehydration and malnutrition. Patient hospitalized 11/13/15 -12/05/15 with Postoperative fever, acute kidney injury, severe protein malnutrition, and sepsis. Status post Open reduction of hiatal hernia repair of hiatal hernia, resection of small bowel with anastomosis,placement of gastrostomy tube, placement of duodenostomy tube on 11/21/15.  Patient hospitalized 10/27/15 - 11/09/15 for GI bleed, hiatal hernia, acute blood loss anemia, and gastric ulcer. Patient has a history of diverticulosis with perforation, hyperlipidemia, and chronic kidney disease. Patient status post REPAIR OF BLEEDING ULCER PARTIAL DISTAL GASTRECTOMY WITH BILROTH II RECONSTRUCTION/GASTROJEJUNOSTOMY on 10/30/15.     Assessment: Received [facility]MR Transition of care referral on 06/30/16. Transition of care follow up pending patient contact.    Plan:RNCM will call patient for 3rd telephone outreach attempt, transition of care follow up, within 10 business days if no return call.    Lester Crickenberger H. Annia Friendly, BSN, [place] Management [facility] Telephonic CM Phone: [phone] Fax: [phone]

## 2016-07-26 ENCOUNTER — Ambulatory Visit: Payer: Medicare Other | Admitting: *Deleted

## 2016-07-26 DIAGNOSIS — Z431 Encounter for attention to gastrostomy: Secondary | ICD-10-CM | POA: Diagnosis not present

## 2016-07-26 DIAGNOSIS — I10 Essential (primary) hypertension: Secondary | ICD-10-CM | POA: Diagnosis not present

## 2016-07-26 DIAGNOSIS — D649 Anemia, unspecified: Secondary | ICD-10-CM | POA: Diagnosis not present

## 2016-07-26 DIAGNOSIS — K5730 Diverticulosis of large intestine without perforation or abscess without bleeding: Secondary | ICD-10-CM | POA: Diagnosis not present

## 2016-07-26 DIAGNOSIS — Z7982 Long term (current) use of aspirin: Secondary | ICD-10-CM | POA: Diagnosis not present

## 2016-07-26 DIAGNOSIS — Z85820 Personal history of malignant melanoma of skin: Secondary | ICD-10-CM | POA: Diagnosis not present

## 2016-07-26 DIAGNOSIS — Z87442 Personal history of urinary calculi: Secondary | ICD-10-CM | POA: Diagnosis not present

## 2016-07-26 DIAGNOSIS — T814XXA Infection following a procedure, initial encounter: Secondary | ICD-10-CM | POA: Diagnosis not present

## 2016-07-26 DIAGNOSIS — E440 Moderate protein-calorie malnutrition: Secondary | ICD-10-CM | POA: Diagnosis not present

## 2016-07-27 ENCOUNTER — Ambulatory Visit
Admission: RE | Admit: 2016-07-27 | Discharge: 2016-07-27 | Disposition: A | Discharge status: Home / Self Care | Payer: 59 | Source: Ambulatory Visit | Attending: Surgery | Admitting: Surgery

## 2016-07-27 ENCOUNTER — Ambulatory Visit
Admission: RE | Admit: 2016-07-27 | Discharge: 2016-07-27 | Disposition: A | Discharge status: Home / Self Care | Payer: 59 | Source: Ambulatory Visit | Attending: Radiology | Admitting: Radiology

## 2016-07-27 DIAGNOSIS — Z85820 Personal history of malignant melanoma of skin: Secondary | ICD-10-CM | POA: Diagnosis not present

## 2016-07-27 DIAGNOSIS — K651 Peritoneal abscess: Secondary | ICD-10-CM

## 2016-07-27 DIAGNOSIS — Z7982 Long term (current) use of aspirin: Secondary | ICD-10-CM | POA: Diagnosis not present

## 2016-07-27 DIAGNOSIS — Z87442 Personal history of urinary calculi: Secondary | ICD-10-CM | POA: Diagnosis not present

## 2016-07-27 DIAGNOSIS — T814XXA Infection following a procedure, initial encounter: Secondary | ICD-10-CM | POA: Diagnosis not present

## 2016-07-27 DIAGNOSIS — Z431 Encounter for attention to gastrostomy: Secondary | ICD-10-CM | POA: Diagnosis not present

## 2016-07-27 DIAGNOSIS — E440 Moderate protein-calorie malnutrition: Secondary | ICD-10-CM | POA: Diagnosis not present

## 2016-07-27 DIAGNOSIS — R935 Abnormal findings on diagnostic imaging of other abdominal regions, including retroperitoneum: Secondary | ICD-10-CM | POA: Diagnosis not present

## 2016-07-27 HISTORY — PX: IR RADIOLOGIST EVAL & MGMT: IMG5224

## 2016-07-27 MED ORDER — IOPAMIDOL (ISOVUE-300) INJECTION 61%
100.0000 mL | Freq: Once | INTRAVENOUS | Status: AC | PRN
  Administered 2016-07-27: 100 mL via INTRAVENOUS

## 2016-07-27 NOTE — Progress Notes (Signed)
Referring Physician(s): Dr Evlyn Courier  Chief Complaint: The patient is seen in follow up today s/p Status post colostomy takedown and development of intra-abdominal peritoneal fluid collections.  2 anterior abdominal drains placed 07/07/2016 additional right flank drain placed 07/17/16   History of present illness:  Pt feeling better daily Denies fever; chills Feeds only by tube feeds for now. Diffuse abdominal pain---nothing sharp or new.  CT today shows resolution of abscess collections per Dr Kathlene Cote  To see Dr Ninfa Linden tomorrow for plan  Past Medical History:  Diagnosis Date  . Anemia   . Anxiety   . BPH (benign prostatic hyperplasia)   . Gastritis   . GERD (gastroesophageal reflux disease)    "seldom" (07/16/2016)  . GI bleed due to NSAIDs 10/27/2015  . History of blood transfusion 06/2016   post OR/notes 07/15/2016  . History of hiatal hernia   . History of kidney stones   . Hyperlipidemia   . Hypertension   . Melanoma of back ([place])    "mid back"  . Sigmoid diverticulitis    with perforation    Past Surgical History:  Procedure Laterality Date  . COLON SURGERY     sigmoid  . COLOSTOMY TAKEDOWN N/A 06/28/2016   Procedure: COLOSTOMY TAKEDOWN;  Surgeon: [name], MD;  Location: [place];  Service: General;  Laterality: N/A;  . ESOPHAGOGASTRODUODENOSCOPY N/A 10/27/2015   Procedure: ESOPHAGOGASTRODUODENOSCOPY (EGD);  Surgeon: Clarene Essex, MD;  Location: [facility] ENDOSCOPY;  Service: Endoscopy;  Laterality: N/A;  . ESOPHAGOGASTRODUODENOSCOPY N/A 10/29/2015   Procedure: ESOPHAGOGASTRODUODENOSCOPY (EGD);  Surgeon: Ronald Lobo, MD;  Location: [facility] ENDOSCOPY;  Service: Endoscopy;  Laterality: N/A;  . ESOPHAGOGASTRODUODENOSCOPY N/A 10/30/2015   Procedure: ESOPHAGOGASTRODUODENOSCOPY (EGD);  Surgeon: Clarene Essex, MD;  Location: [facility] ENDOSCOPY;  Service: Endoscopy;  Laterality: N/A;  . GASTROSTOMY TUBE PLACEMENT  11/21/2015   REDUCTION OF HIATAL HERNIA , REPAIR HIATAL HERNIA,  RESECTION SMALL BOWEL WITH ANASTOMOSIS, PLACEMENT GASTROSTOMY TUBE, PLACEMENT DUODENOSTOMY TUBE (N/A)  . HEMORRHOID BANDING  X 2  . HERNIA REPAIR    . HIATAL HERNIA REPAIR N/A 11/21/2015   Procedure: REDUCTION OF HIATAL HERNIA , REPAIR HIATAL HERNIA, RESECTION SMALL BOWEL WITH ANASTOMOSIS, PLACEMENT GASTROSTOMY TUBE, PLACEMENT DUODENOSTOMY TUBE;  Surgeon: Mickeal Skinner, MD;  Location: [place];  Service: General;  Laterality: N/A;  . [place]  06/28/2016   open/notes 07/15/2016  . IR GUIDED DRAIN W CATHETER PLACEMENT  07/06/2016   /NOTES 07/15/2016  . KNEE CARTILAGE SURGERY Right 1971   "opened me up"  . LAPAROTOMY N/A 07/05/2015   Procedure: PARTIAL SIGMOID COLECTOMY AND COLOSTOMY;  Surgeon: [name], MD;  Location: [place];  Service: General;  Laterality: N/A;  . MELANOMA EXCISION  2001  . 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 REPAIR OF PERFORATED ULCER N/A 10/30/2015   Procedure: REPAIR OF BLEEDING  ULCER;  Surgeon: Judeth Horn, MD;  Location: [place];  Service: General;  Laterality: N/A;  . TUMOR EXCISION  2009   "back; fatty tumor"    Allergies: Patient has no known allergies.  Medications: Prior to Admission medications   Medication Sig Start Date End Date Taking? Authorizing Provider  acetaminophen (TYLENOL) 500 MG tablet Take 1 tablet (500 mg total) by mouth every 8 (eight) hours as needed for mild pain (for pain). 12/05/15   Darci Current Simaan, PA-C  amoxicillin-clavulanate (AUGMENTIN)  875-125 MG tablet Take 1 tablet by mouth 2 (two) times daily. 07/13/16 07/27/16  [name], MD  aspirin EC 81 MG tablet Take 81 mg by mouth daily after supper.     Historical Provider, MD  diclofenac sodium (VOLTAREN) 1 % GEL Apply 1 application topically 2 (two) times daily as needed (back and shoulder pain).    Historical Provider, MD    famotidine (PEPCID) 20 MG tablet Take 1 tablet (20 mg total) by mouth 2 (two) times daily. 11/09/15   Elwin Mocha, MD  ferrous sulfate 325 (65 FE) MG tablet Take 325 mg by mouth daily.    Historical Provider, MD  HYDROcodone-acetaminophen (NORCO/VICODIN) 5-325 MG tablet Take 1-2 tablets by mouth every 4 (four) hours as needed for moderate pain. 07/13/16   [name], MD  Multiple Vitamin (MULTIVITAMIN WITH MINERALS) TABS tablet Take 1 tablet by mouth daily.    Historical Provider, MD  Nutritional Supplements (FEEDING SUPPLEMENT, OSMOLITE 1.5 CAL,) LIQD Place 1,000 mLs into feeding tube continuous.    Historical Provider, MD  Omega-3 Fatty Acids (FISH OIL) 1000 MG CPDR Take 1,000 mg by mouth daily.    Historical Provider, MD  Probiotic Product (ALIGN) 4 MG CAPS Take 1 capsule by mouth daily.    Historical Provider, MD  sodium chloride flush (NS) 0.9 % SOLN 10 mLs by Other route daily. 07/21/16   Jill Alexanders, PA-C  tamsulosin (FLOMAX) 0.4 MG CAPS capsule Take 1 capsule (0.4 mg total) by mouth daily. 01/04/16   Fanny Skates, MD  traMADol (ULTRAM) 50 MG tablet Take 50 mg by mouth every 6 (six) hours as needed for moderate pain.  05/21/16   Historical Provider, MD  zolpidem (AMBIEN) 10 MG tablet Take 5-10 mg by mouth at bedtime as needed and may repeat dose one time if needed for sleep.     Historical Provider, MD     Family History  Problem Relation Age of Onset  . Stroke Mother   . Stroke Brother   . Heart disease Brother     Social History   Social History  . Marital status: Married    Spouse name: N/A  . Number of children: N/A  . Years of education: N/A   Occupational History  . unable to work since April    Social History Main Topics  . Smoking status: Never Smoker  . Smokeless tobacco: Former Systems developer    Types: Snuff    Quit date: 07/13/2016  . Alcohol use No  . Drug use: No  . Sexual activity: Yes   Other Topics Concern  . Not on file   Social History  Narrative  . No narrative on file     Vital Signs: BP 94/65 (BP Location: Left Arm, Patient Position: Sitting, Cuff Size: Normal)   Pulse (!) 107   Temp 98.2 F (36.8 C) (Oral)   Resp 15   SpO2 97%   Physical Exam  Constitutional: He is oriented to person, place, and time.  Abdominal: Soft. Bowel sounds are normal. There is tenderness.  Musculoskeletal: Normal range of motion.  Neurological: He is alert and oriented to person, place, and time.  Skin: Skin is warm and dry.  All sites of drains are clean and dry NT no bleeding OP of all drains are = to input Flushing with 10 cc BID Output of all are cloudy yellow   Nursing note and vitals reviewed.   Imaging: No results found.  Labs:  CBC:  Recent Labs  07/10/16 0945 07/15/16 1853 07/18/16 1156 07/19/16 0343  WBC 13.3* 8.8 13.0* 13.0*  HGB 9.5* 8.9* 9.7* 9.5*  HCT 29.6* 26.8* 30.2* 29.6*  PLT 582* 639* 665* 649*    COAGS:  Recent Labs  10/29/15 1851 10/30/15 1245 10/30/15 1636 11/06/15 0453 06/30/16 1605 07/06/16 1053  INR 1.28 1.42 1.34 1.38 1.29 1.15  APTT 29 27 25   --  32  --     BMP:  Recent Labs  07/10/16 0945 07/15/16 1853 07/18/16 1156 07/19/16 0343  NA 132* 131* 128* 131*  K 4.4 4.1 4.0 3.9  CL 97* 96* 95* 97*  CO2 28 24 27 27   GLUCOSE 162* 101* 123* 118*  BUN 13 9 10 11   CALCIUM 7.1* 7.8* 7.4* 7.6*  CREATININE 0.80 0.75 0.80 0.79  GFRNONAA >60 >60 >60 >60  GFRAA >60 >60 >60 >60    LIVER FUNCTION TESTS:  Recent Labs  12/22/15 1234 12/25/15 1736 12/26/15 0636 07/15/16 1853  BILITOT 0.6 0.5 0.8 0.5  AST 21 26 17 15   ALT 21 21 16* 19  ALKPHOS 105 108 87 76  PROT 5.8* 6.6 5.4* 5.2*  ALBUMIN 2.2* 2.6* 2.1* 1.7*    Assessment:  Colostomy takedown with post intra abdominal abscesses. 2 drains placed 4/11 and additional drain placed 07/17/16 Today CT shows resolution  All removed per Dr Kathlene Cote Follow with Dr Ninfa Linden  Signed: Monia Sabal A 07/27/2016, 11:35  AM   Please refer to Dr. Kathlene Cote attestation of this note for management and plan.      Patient ID: Miguel [name], male   DOB: [birth year], 66 y.o.   MRN: [id number]

## 2016-07-29 DIAGNOSIS — D649 Anemia, unspecified: Secondary | ICD-10-CM | POA: Diagnosis not present

## 2016-07-29 DIAGNOSIS — E440 Moderate protein-calorie malnutrition: Secondary | ICD-10-CM | POA: Diagnosis not present

## 2016-07-29 DIAGNOSIS — Z87442 Personal history of urinary calculi: Secondary | ICD-10-CM | POA: Diagnosis not present

## 2016-07-29 DIAGNOSIS — Z7982 Long term (current) use of aspirin: Secondary | ICD-10-CM | POA: Diagnosis not present

## 2016-07-29 DIAGNOSIS — T814XXA Infection following a procedure, initial encounter: Secondary | ICD-10-CM | POA: Diagnosis not present

## 2016-07-29 DIAGNOSIS — Z431 Encounter for attention to gastrostomy: Secondary | ICD-10-CM | POA: Diagnosis not present

## 2016-07-29 DIAGNOSIS — I10 Essential (primary) hypertension: Secondary | ICD-10-CM | POA: Diagnosis not present

## 2016-07-29 DIAGNOSIS — Z85820 Personal history of malignant melanoma of skin: Secondary | ICD-10-CM | POA: Diagnosis not present

## 2016-07-29 DIAGNOSIS — K5730 Diverticulosis of large intestine without perforation or abscess without bleeding: Secondary | ICD-10-CM | POA: Diagnosis not present

## 2016-08-01 ENCOUNTER — Other Ambulatory Visit: Payer: Self-pay | Admitting: Surgery

## 2016-08-01 MED ORDER — DOXYCYCLINE HYCLATE 100 MG PO TABS: 100.0000 mg | ORAL_TABLET | Freq: Two times a day (BID) | ORAL | Status: AC

## 2016-08-02 DIAGNOSIS — I10 Essential (primary) hypertension: Secondary | ICD-10-CM | POA: Diagnosis not present

## 2016-08-02 DIAGNOSIS — T814XXA Infection following a procedure, initial encounter: Secondary | ICD-10-CM | POA: Diagnosis not present

## 2016-08-02 DIAGNOSIS — Z87442 Personal history of urinary calculi: Secondary | ICD-10-CM | POA: Diagnosis not present

## 2016-08-02 DIAGNOSIS — Z85820 Personal history of malignant melanoma of skin: Secondary | ICD-10-CM | POA: Diagnosis not present

## 2016-08-02 DIAGNOSIS — Z7982 Long term (current) use of aspirin: Secondary | ICD-10-CM | POA: Diagnosis not present

## 2016-08-02 DIAGNOSIS — K5730 Diverticulosis of large intestine without perforation or abscess without bleeding: Secondary | ICD-10-CM | POA: Diagnosis not present

## 2016-08-02 DIAGNOSIS — Z431 Encounter for attention to gastrostomy: Secondary | ICD-10-CM | POA: Diagnosis not present

## 2016-08-02 DIAGNOSIS — E440 Moderate protein-calorie malnutrition: Secondary | ICD-10-CM | POA: Diagnosis not present

## 2016-08-02 DIAGNOSIS — D649 Anemia, unspecified: Secondary | ICD-10-CM | POA: Diagnosis not present

## 2016-08-03 ENCOUNTER — Inpatient Hospital Stay (HOSPITAL_COMMUNITY): Payer: 59

## 2016-08-03 ENCOUNTER — Inpatient Hospital Stay (HOSPITAL_COMMUNITY)
Admission: AD | Admit: 2016-08-03 | Discharge: 2016-08-07 | DRG: 862 | Disposition: A | Discharge status: Home Health | Payer: 59 | Source: Ambulatory Visit | Attending: Surgery | Admitting: Surgery

## 2016-08-03 ENCOUNTER — Other Ambulatory Visit: Payer: Self-pay | Admitting: Surgery

## 2016-08-03 ENCOUNTER — Encounter (HOSPITAL_COMMUNITY): Payer: Self-pay | Admitting: General Practice

## 2016-08-03 DIAGNOSIS — I10 Essential (primary) hypertension: Secondary | ICD-10-CM | POA: Diagnosis present

## 2016-08-03 DIAGNOSIS — Z903 Acquired absence of stomach [part of]: Secondary | ICD-10-CM

## 2016-08-03 DIAGNOSIS — Z7982 Long term (current) use of aspirin: Secondary | ICD-10-CM | POA: Diagnosis not present

## 2016-08-03 DIAGNOSIS — R5381 Other malaise: Secondary | ICD-10-CM | POA: Diagnosis present

## 2016-08-03 DIAGNOSIS — Z87891 Personal history of nicotine dependence: Secondary | ICD-10-CM

## 2016-08-03 DIAGNOSIS — Z8711 Personal history of peptic ulcer disease: Secondary | ICD-10-CM | POA: Diagnosis not present

## 2016-08-03 DIAGNOSIS — K651 Peritoneal abscess: Secondary | ICD-10-CM | POA: Diagnosis not present

## 2016-08-03 DIAGNOSIS — K9423 Gastrostomy malfunction: Secondary | ICD-10-CM | POA: Diagnosis present

## 2016-08-03 DIAGNOSIS — K921 Melena: Secondary | ICD-10-CM

## 2016-08-03 DIAGNOSIS — A499 Bacterial infection, unspecified: Secondary | ICD-10-CM | POA: Diagnosis not present

## 2016-08-03 DIAGNOSIS — K6811 Postprocedural retroperitoneal abscess: Principal | ICD-10-CM | POA: Diagnosis present

## 2016-08-03 DIAGNOSIS — E43 Unspecified severe protein-calorie malnutrition: Secondary | ICD-10-CM | POA: Diagnosis present

## 2016-08-03 DIAGNOSIS — Z6821 Body mass index (BMI) 21.0-21.9, adult: Secondary | ICD-10-CM | POA: Diagnosis not present

## 2016-08-03 DIAGNOSIS — R6889 Other general symptoms and signs: Secondary | ICD-10-CM

## 2016-08-03 DIAGNOSIS — R5082 Postprocedural fever: Secondary | ICD-10-CM | POA: Diagnosis present

## 2016-08-03 DIAGNOSIS — Z931 Gastrostomy status: Secondary | ICD-10-CM

## 2016-08-03 DIAGNOSIS — M7981 Nontraumatic hematoma of soft tissue: Secondary | ICD-10-CM | POA: Diagnosis not present

## 2016-08-03 DIAGNOSIS — Z79899 Other long term (current) drug therapy: Secondary | ICD-10-CM | POA: Diagnosis not present

## 2016-08-03 DIAGNOSIS — R739 Hyperglycemia, unspecified: Secondary | ICD-10-CM

## 2016-08-03 DIAGNOSIS — T8140XA Infection following a procedure, unspecified, initial encounter: Secondary | ICD-10-CM

## 2016-08-03 DIAGNOSIS — R188 Other ascites: Secondary | ICD-10-CM

## 2016-08-03 DIAGNOSIS — R509 Fever, unspecified: Secondary | ICD-10-CM

## 2016-08-03 LAB — CBC WITH DIFFERENTIAL/PLATELET
BASOS ABS: 0 10*3/uL (ref 0.0–0.1)
BASOS PCT: 0 %
Eosinophils Absolute: 0.1 10*3/uL (ref 0.0–0.7)
Eosinophils Relative: 1 %
HEMATOCRIT: 30 % — AB (ref 39.0–52.0)
HEMOGLOBIN: 9.5 g/dL — AB (ref 13.0–17.0)
Lymphocytes Relative: 8 %
Lymphs Abs: 0.9 10*3/uL (ref 0.7–4.0)
MCH: 27.9 pg (ref 26.0–34.0)
MCHC: 31.7 g/dL (ref 30.0–36.0)
MCV: 88.2 fL (ref 78.0–100.0)
Monocytes Absolute: 1 10*3/uL (ref 0.1–1.0)
Monocytes Relative: 10 %
NEUTROS ABS: 8.5 10*3/uL — AB (ref 1.7–7.7)
NEUTROS PCT: 81 %
Platelets: 442 10*3/uL — ABNORMAL HIGH (ref 150–400)
RBC: 3.4 MIL/uL — AB (ref 4.22–5.81)
RDW: 14.7 % (ref 11.5–15.5)
WBC: 10.5 10*3/uL (ref 4.0–10.5)

## 2016-08-03 LAB — COMPREHENSIVE METABOLIC PANEL
ALBUMIN: 2.2 g/dL — AB (ref 3.5–5.0)
ALK PHOS: 96 U/L (ref 38–126)
ALT: 18 U/L (ref 17–63)
AST: 18 U/L (ref 15–41)
Anion gap: 10 (ref 5–15)
BILIRUBIN TOTAL: 0.5 mg/dL (ref 0.3–1.2)
BUN: 15 mg/dL (ref 6–20)
CALCIUM: 8.3 mg/dL — AB (ref 8.9–10.3)
CO2: 28 mmol/L (ref 22–32)
Chloride: 94 mmol/L — ABNORMAL LOW (ref 101–111)
Creatinine, Ser: 0.75 mg/dL (ref 0.61–1.24)
GFR calc Af Amer: >60 mL/min (ref >60)
GFR calc non Af Amer: >60 mL/min (ref >60)
GLUCOSE: 129 mg/dL — AB (ref 65–99)
POTASSIUM: 4.4 mmol/L (ref 3.5–5.1)
Sodium: 132 mmol/L — ABNORMAL LOW (ref 135–145)
TOTAL PROTEIN: 6.5 g/dL (ref 6.5–8.1)

## 2016-08-03 LAB — URINALYSIS, ROUTINE W REFLEX MICROSCOPIC
BILIRUBIN URINE: NEGATIVE
Glucose, UA: NEGATIVE mg/dL
HGB URINE DIPSTICK: NEGATIVE
Ketones, ur: NEGATIVE mg/dL
Leukocytes, UA: NEGATIVE
NITRITE: NEGATIVE
Protein, ur: NEGATIVE mg/dL
SPECIFIC GRAVITY, URINE: 1.026 (ref 1.005–1.030)
pH: 7 (ref 5.0–8.0)

## 2016-08-03 MED ORDER — POTASSIUM CHLORIDE IN NACL 20-0.9 MEQ/L-% IV SOLN: INTRAVENOUS | Status: DC

## 2016-08-03 MED ORDER — OXYCODONE HCL 5 MG PO TABS: 5.0000 mg | ORAL_TABLET | ORAL | Status: DC | PRN

## 2016-08-03 MED ORDER — ACETAMINOPHEN 325 MG PO TABS: 650.0000 mg | ORAL_TABLET | Freq: Four times a day (QID) | ORAL | Status: DC | PRN

## 2016-08-03 MED ORDER — ACETAMINOPHEN 325 MG PO TABS
650.0000 mg | ORAL_TABLET | Freq: Four times a day (QID) | ORAL | Status: DC | PRN
Start: 2016-08-03 — End: 2016-08-07
  Administered 2016-08-03 (×2): 650 mg
  Filled 2016-08-03 (×2): qty 2

## 2016-08-03 MED ORDER — KCL IN DEXTROSE-NACL 20-5-0.45 MEQ/L-%-% IV SOLN
INTRAVENOUS | Status: DC
  Administered 2016-08-03 – 2016-08-06 (×7): via INTRAVENOUS
  Filled 2016-08-03 (×7): qty 1000

## 2016-08-03 MED ORDER — MORPHINE SULFATE (PF) 4 MG/ML IV SOLN: 1.0000 mg | INTRAVENOUS | Status: AC | PRN

## 2016-08-03 MED ORDER — ONDANSETRON HCL 4 MG PO TABS
4.0000 mg | ORAL_TABLET | Freq: Four times a day (QID) | ORAL | Status: DC | PRN
  Administered 2016-08-03: 4 mg
  Filled 2016-08-03: qty 1

## 2016-08-03 MED ORDER — ENOXAPARIN SODIUM 150 MG/ML ~~LOC~~ SOLN: 40.0000 mg | SUBCUTANEOUS | Status: DC

## 2016-08-03 MED ORDER — IOPAMIDOL (ISOVUE-300) INJECTION 61%
15.0000 mL | INTRAVENOUS | Status: AC
  Administered 2016-08-03 (×2): 15 mL via ORAL

## 2016-08-03 MED ORDER — IOPAMIDOL (ISOVUE-300) INJECTION 61%
INTRAVENOUS | Status: AC
  Administered 2016-08-03: 100 mL
  Filled 2016-08-03: qty 100

## 2016-08-03 MED ORDER — SODIUM CHLORIDE 0.9 % IV SOLN
1.5000 g | Freq: Four times a day (QID) | INTRAVENOUS | Status: DC
  Administered 2016-08-03: 1.5 g via INTRAVENOUS
  Filled 2016-08-03 (×5): qty 1.5

## 2016-08-03 MED ORDER — ONDANSETRON HCL 40 MG/20ML IJ SOLN: 4.0000 mg | Freq: Four times a day (QID) | INTRAMUSCULAR | Status: DC | PRN

## 2016-08-03 MED ORDER — ACETAMINOPHEN 650 MG RE SUPP: 650.0000 mg | Freq: Four times a day (QID) | RECTAL | Status: DC | PRN

## 2016-08-03 MED ORDER — ONDANSETRON HCL 4 MG PO TABS: 4.0000 mg | ORAL_TABLET | Freq: Four times a day (QID) | ORAL | Status: DC | PRN

## 2016-08-03 MED ORDER — SODIUM CHLORIDE 0.9 % IV BOLUS (SEPSIS)
1000.0000 mL | Freq: Once | INTRAVENOUS | Status: AC
  Administered 2016-08-03: 1000 mL via INTRAVENOUS

## 2016-08-03 MED ORDER — MORPHINE SULFATE (PF) 4 MG/ML IV SOLN
1.0000 mg | INTRAVENOUS | Status: DC | PRN
  Administered 2016-08-03 – 2016-08-04 (×9): 2 mg via INTRAVENOUS
  Administered 2016-08-05: 4 mg via INTRAVENOUS
  Administered 2016-08-05: 2 mg via INTRAVENOUS
  Administered 2016-08-05: 4 mg via INTRAVENOUS
  Administered 2016-08-05 (×2): 2 mg via INTRAVENOUS
  Administered 2016-08-06 (×3): 4 mg via INTRAVENOUS
  Administered 2016-08-06: 2 mg via INTRAVENOUS
  Administered 2016-08-06: 4 mg via INTRAVENOUS
  Administered 2016-08-06: 2 mg via INTRAVENOUS
  Administered 2016-08-07: 4 mg via INTRAVENOUS
  Filled 2016-08-03 (×21): qty 1

## 2016-08-03 MED ORDER — ENOXAPARIN SODIUM 40 MG/0.4ML ~~LOC~~ SOLN
40.0000 mg | SUBCUTANEOUS | Status: DC
  Administered 2016-08-03: 40 mg via SUBCUTANEOUS
  Filled 2016-08-03: qty 0.4

## 2016-08-03 MED ORDER — ONDANSETRON 4 MG PO TBDP: 4.0000 mg | ORAL_TABLET | Freq: Four times a day (QID) | ORAL | Status: DC | PRN

## 2016-08-03 MED ORDER — PIPERACILLIN-TAZOBACTAM 3.375 G IVPB
3.3750 g | Freq: Three times a day (TID) | INTRAVENOUS | Status: DC
  Administered 2016-08-03 – 2016-08-07 (×11): 3.375 g via INTRAVENOUS
  Filled 2016-08-03 (×12): qty 50

## 2016-08-03 NOTE — Progress Notes (Signed)
Pharmacy Antibiotic Note  Miguel [name] is a 66 y.o. male admitted on 08/03/2016 with fever and malaise.  Patient has a history of colostomy take-down and repair of large incisional hernia repair about a month ago.  Post-op, patient had anastomotic bleeding, G-tube dislodgment and intra-abdominal abscess. Pharmacy has been consulted for Unasyn dosing.  Patient's renal function is stable.   Plan: - Unasyn 1.5gm IV Q6H - Pharmacy will sign off as dosage adjustment is likely unnecessary.  Thank you for the consult!   Height: 5\' 3"  (160 cm) Weight: 123 lb (55.8 kg) IBW/kg (Calculated) : 56.9  Temp (24hrs), Avg:98.2 F (36.8 C), Min:98.2 F (36.8 C), Max:98.2 F (36.8 C)   Recent Labs Lab 08/03/16 1221  WBC 10.5  CREATININE 0.75    Estimated Creatinine Clearance: 71.7 mL/min (by C-G formula based on SCr of 0.75 mg/dL).    No Known Allergies   Unasyn 5/8 >> Doxy PTA 5/6 >> 5/8    Noach Calvillo D. Mina Marble, PharmD, BCPS Pager:  [phone] 08/03/2016, 1:19 PM

## 2016-08-03 NOTE — H&P (Signed)
Miguel [name] is an 66 y.o. male.   Chief Complaint: fever, malaise HPI: this is a 74--year-old gentleman who is one-month status post colostomy takedown and repair of a large incisional hernia. His postoperative course was compensated by anastomotic bleeding, G-tube dislodgment,and intra-abdominal abscesses.   Interventional radiology placed percutaneous drains in the abscesses. These resolved on follow-up CAT scan last week and were removed. He has had persistent fevers and generalized malaise.  I needle aspirated a small seroma in his right upper quadrant did not appear infected. Because of his generalized malaise and fevers, I decided to admit him to the hospital.  Past Medical History:  Diagnosis Date  . Anemia   . Anxiety   . BPH (benign prostatic hyperplasia)   . Gastritis   . GERD (gastroesophageal reflux disease)    "seldom" (07/16/2016)  . GI bleed due to NSAIDs 10/27/2015  . History of blood transfusion 06/2016   post OR/notes 07/15/2016  . History of hiatal hernia   . History of kidney stones   . Hyperlipidemia   . Hypertension   . Melanoma of back ([place])    "mid back"  . Sigmoid diverticulitis    with perforation    Past Surgical History:  Procedure Laterality Date  . COLON SURGERY     sigmoid  . COLOSTOMY TAKEDOWN N/A 06/28/2016   Procedure: COLOSTOMY TAKEDOWN;  Surgeon: [name], MD;  Location: [place];  Service: General;  Laterality: N/A;  . ESOPHAGOGASTRODUODENOSCOPY N/A 10/27/2015   Procedure: ESOPHAGOGASTRODUODENOSCOPY (EGD);  Surgeon: Clarene Essex, MD;  Location: [facility] ENDOSCOPY;  Service: Endoscopy;  Laterality: N/A;  . ESOPHAGOGASTRODUODENOSCOPY N/A 10/29/2015   Procedure: ESOPHAGOGASTRODUODENOSCOPY (EGD);  Surgeon: Ronald Lobo, MD;  Location: [facility] ENDOSCOPY;  Service: Endoscopy;  Laterality: N/A;  . ESOPHAGOGASTRODUODENOSCOPY N/A 10/30/2015   Procedure: ESOPHAGOGASTRODUODENOSCOPY (EGD);  Surgeon: Clarene Essex, MD;  Location: [facility] ENDOSCOPY;  Service: Endoscopy;   Laterality: N/A;  . GASTROSTOMY TUBE PLACEMENT  11/21/2015   REDUCTION OF HIATAL HERNIA , REPAIR HIATAL HERNIA, RESECTION SMALL BOWEL WITH ANASTOMOSIS, PLACEMENT GASTROSTOMY TUBE, PLACEMENT DUODENOSTOMY TUBE (N/A)  . HEMORRHOID BANDING  X 2  . HERNIA REPAIR    . HIATAL HERNIA REPAIR N/A 11/21/2015   Procedure: REDUCTION OF HIATAL HERNIA , REPAIR HIATAL HERNIA, RESECTION SMALL BOWEL WITH ANASTOMOSIS, PLACEMENT GASTROSTOMY TUBE, PLACEMENT DUODENOSTOMY TUBE;  Surgeon: Mickeal Skinner, MD;  Location: [place];  Service: General;  Laterality: N/A;  . [place]  06/28/2016   open/notes 07/15/2016  . IR GUIDED DRAIN W CATHETER PLACEMENT  07/06/2016   /NOTES 07/15/2016  . KNEE CARTILAGE SURGERY Right 1971   "opened me up"  . LAPAROTOMY N/A 07/05/2015   Procedure: PARTIAL SIGMOID COLECTOMY AND COLOSTOMY;  Surgeon: [name], MD;  Location: [place];  Service: General;  Laterality: N/A;  . MELANOMA EXCISION  2001  . 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  10/30/2015   Procedure: REMOVAL OF DISTAL STOMACH;  Surgeon: Judeth Horn, MD;  Location: [place];  Service: General;;  . REPAIR OF PERFORATED ULCER N/A 10/30/2015   Procedure: REPAIR OF BLEEDING  ULCER;  Surgeon: Judeth Horn, MD;  Location: [place];  Service: General;  Laterality: N/A;  . TUMOR EXCISION  2009   "back; fatty tumor"    Family History  Problem Relation Age of Onset  . Stroke Mother   . Stroke Brother   . Heart disease Brother    Social History:  reports that he has never smoked. He quit smokeless tobacco use  about 3 weeks ago. His smokeless tobacco use included Snuff. He reports that he does not drink alcohol or use drugs.  Allergies: No Known Allergies  Facility-Administered Medications Prior to Admission  Medication Dose Route Frequency Provider Last Rate Last Dose  . doxycycline (VIBRA-TABS) tablet 100 mg  100 mg Oral Q12H [name], MD       Medications Prior to Admission  Medication Sig  Dispense Refill  . acetaminophen (TYLENOL) 500 MG tablet Take 1 tablet (500 mg total) by mouth every 8 (eight) hours as needed for mild pain (for pain). (Patient taking differently: Take 500 mg by mouth every 8 (eight) hours as needed for mild pain or fever. ) 30 tablet 0  . aspirin EC 81 MG tablet Take 81 mg by mouth daily after supper.     . doxycycline (VIBRAMYCIN) 100 MG capsule Take 100 mg by mouth 2 (two) times daily. For 10 days    . famotidine (PEPCID) 20 MG tablet Take 1 tablet (20 mg total) by mouth 2 (two) times daily. 42 tablet 0  . ferrous sulfate 325 (65 FE) MG tablet Take 325 mg by mouth daily.    . Multiple Vitamin (MULTIVITAMIN WITH MINERALS) TABS tablet Take 1 tablet by mouth daily.    . Nutritional Supplements (FEEDING SUPPLEMENT, OSMOLITE 1.5 CAL,) LIQD Place 1,000 mLs into feeding tube continuous.    . Omega-3 Fatty Acids (FISH OIL) 1200 MG CAPS Take 1,200 mg by mouth daily.     . ondansetron (ZOFRAN) 4 MG tablet Take 4 mg by mouth as needed for nausea or vomiting.     Marland Kitchen oxyCODONE (OXY IR/ROXICODONE) 5 MG immediate release tablet Take 5 mg by mouth every 6 (six) hours as needed for severe pain.    . Probiotic Product (ALIGN) 4 MG CAPS Take 4 mg by mouth daily.     . Red Yeast Rice Extract 300 MG CAPS Take 300 mg by mouth daily.    . tamsulosin (FLOMAX) 0.4 MG CAPS capsule Take 1 capsule (0.4 mg total) by mouth daily. 30 capsule 1  . traMADol (ULTRAM) 50 MG tablet Take 50 mg by mouth every 6 (six) hours as needed for moderate pain.     Marland Kitchen zolpidem (AMBIEN) 10 MG tablet Take 5-10 mg by mouth at bedtime as needed for sleep.     Marland Kitchen diclofenac sodium (VOLTAREN) 1 % GEL Apply 1 application topically 2 (two) times daily as needed (back and shoulder pain).    Marland Kitchen HYDROcodone-acetaminophen (NORCO/VICODIN) 5-325 MG tablet Take 1-2 tablets by mouth every 4 (four) hours as needed for moderate pain. (Patient not taking: Reported on 08/03/2016) 30 tablet 0  . sodium chloride flush (NS) 0.9 % SOLN  10 mLs by Other route daily. (Patient not taking: Reported on 08/03/2016) 30 Syringe 0    Results for orders placed or performed during the hospital encounter of 08/03/16 (from the past 48 hour(s))  CBC with Differential/Platelet     Status: Abnormal   Collection Time: 08/03/16 12:21 PM  Result Value Ref Range   WBC 10.5 4.0 - 10.5 K/uL   RBC 3.40 (L) 4.22 - 5.81 MIL/uL   Hemoglobin 9.5 (L) 13.0 - 17.0 g/dL   HCT 30.0 (L) 39.0 - 52.0 %   MCV 88.2 78.0 - 100.0 fL   MCH 27.9 26.0 - 34.0 pg   MCHC 31.7 30.0 - 36.0 g/dL   RDW 14.7 11.5 - 15.5 %   Platelets 442 (H) 150 - 400 K/uL  Neutrophils Relative % 81 %   Neutro Abs 8.5 (H) 1.7 - 7.7 K/uL   Lymphocytes Relative 8 %   Lymphs Abs 0.9 0.7 - 4.0 K/uL   Monocytes Relative 10 %   Monocytes Absolute 1.0 0.1 - 1.0 K/uL   Eosinophils Relative 1 %   Eosinophils Absolute 0.1 0.0 - 0.7 K/uL   Basophils Relative 0 %   Basophils Absolute 0.0 0.0 - 0.1 K/uL   No results found.  Review of Systems  All other systems reviewed and are negative.   Blood pressure 112/70, pulse (!) 120, temperature 98.2 F (36.8 C), temperature source Oral, resp. rate 16, height 5\' 3"  (1.6 m), weight 55.8 kg (123 lb), SpO2 97 %. Physical Exam  Constitutional: He is oriented to person, place, and time. He appears well-developed and well-nourished.  Appears weak  HENT:  Head: Normocephalic and atraumatic.  Right Ear: External ear normal.  Left Ear: External ear normal.  Eyes: Pupils are equal, round, and reactive to light. No scleral icterus.  Neck: Normal range of motion.  Cardiovascular: Normal heart sounds.   No murmur heard. Tachycardic with regular rhythm  Respiratory: Effort normal and breath sounds normal. No respiratory distress.  GI: Soft.  Is midline incision is well healed except for a small open area. He is nondistended. His G-tube appears intact. There is minimal abdominal tenderness  Musculoskeletal: Normal range of motion. He exhibits no  edema.  Lymphadenopathy:    He has no cervical adenopathy.  Neurological: He is alert and oriented to person, place, and time.  Skin: Skin is warm and dry. No erythema.  Psychiatric: His behavior is normal.     Assessment/Plan Postoperative fever and malaise with deconditioning  He is being admitted to the hospital for IV rehydration, IV antibiotics, and a CT scan to see if there is any recurrent intra-abdominal infection.  Harl Bowie, MD 08/03/2016, 1:04 PM

## 2016-08-03 NOTE — Progress Notes (Signed)
Results of CT scan called to MD on call, Dr. Rosendo Gros.  No new orders received at this time.  Will continue to monitor.  Miguel [name]

## 2016-08-04 ENCOUNTER — Encounter (HOSPITAL_COMMUNITY): Payer: Self-pay | Admitting: Interventional Radiology

## 2016-08-04 ENCOUNTER — Inpatient Hospital Stay (HOSPITAL_COMMUNITY): Payer: 59

## 2016-08-04 HISTORY — PX: IR US GUIDE BX ASP/DRAIN: IMG2392

## 2016-08-04 HISTORY — PX: IR REPLC GASTRO/COLONIC TUBE PERCUT W/FLUORO: IMG2333

## 2016-08-04 LAB — PROTIME-INR
INR: 1.28
PROTHROMBIN TIME: 16 s — AB (ref 11.4–15.2)

## 2016-08-04 MED ORDER — ENOXAPARIN SODIUM 40 MG/0.4ML ~~LOC~~ SOLN
40.0000 mg | SUBCUTANEOUS | Status: DC
  Administered 2016-08-05 – 2016-08-06 (×2): 40 mg via SUBCUTANEOUS
  Filled 2016-08-04 (×2): qty 0.4

## 2016-08-04 MED ORDER — LIDOCAINE HCL 1 % IJ SOLN
INTRAMUSCULAR | Status: AC
  Filled 2016-08-04: qty 20

## 2016-08-04 MED ORDER — FENTANYL CITRATE (PF) 100 MCG/2ML IJ SOLN
INTRAMUSCULAR | Status: AC
  Filled 2016-08-04: qty 4

## 2016-08-04 MED ORDER — FENTANYL CITRATE (PF) 100 MCG/2ML IJ SOLN
INTRAMUSCULAR | Status: AC | PRN
  Administered 2016-08-04 (×2): 50 ug via INTRAVENOUS

## 2016-08-04 MED ORDER — IOPAMIDOL (ISOVUE-300) INJECTION 61%
INTRAVENOUS | Status: AC
  Administered 2016-08-04: 20 mL
  Filled 2016-08-04: qty 50

## 2016-08-04 MED ORDER — ORAL CARE MOUTH RINSE
15.0000 mL | Freq: Two times a day (BID) | OROMUCOSAL | Status: DC
  Administered 2016-08-04 (×2): 15 mL via OROMUCOSAL

## 2016-08-04 MED ORDER — MIDAZOLAM HCL 2 MG/2ML IJ SOLN
INTRAMUSCULAR | Status: AC | PRN
  Administered 2016-08-04 (×3): 1 mg via INTRAVENOUS

## 2016-08-04 MED ORDER — FENTANYL CITRATE (PF) 100 MCG/2ML IJ SOLN
INTRAMUSCULAR | Status: AC | PRN
  Administered 2016-08-04: 25 ug via INTRAVENOUS
  Administered 2016-08-04 (×2): 50 ug via INTRAVENOUS
  Administered 2016-08-04: 25 ug via INTRAVENOUS

## 2016-08-04 MED ORDER — FENTANYL CITRATE (PF) 100 MCG/2ML IJ SOLN
INTRAMUSCULAR | Status: AC
  Filled 2016-08-04: qty 2

## 2016-08-04 MED ORDER — MIDAZOLAM HCL 2 MG/2ML IJ SOLN
INTRAMUSCULAR | Status: AC
  Filled 2016-08-04: qty 6

## 2016-08-04 MED ORDER — LIDOCAINE-EPINEPHRINE 1 %-1:100000 IJ SOLN
INTRAMUSCULAR | Status: AC
  Filled 2016-08-04: qty 1

## 2016-08-04 MED ORDER — LIDOCAINE VISCOUS 2 % MT SOLN
OROMUCOSAL | Status: AC
  Filled 2016-08-04: qty 15

## 2016-08-04 NOTE — Procedures (Signed)
Pre procedural Dx: Abdominal fluid collection Post procedural Dx: Same  Technically successful CT guided placed of a 10 Fr drainage catheter placement into the recurrent air and fluid collection within the lower abdomen yielding 55 cc of bloody tinged purulent appearing fluid.    All aspirated samples sent to the laboratory for analysis.    EBL: Minimal  Complications: None immediate  Ronny Bacon, MD Pager #: [phone]

## 2016-08-04 NOTE — Progress Notes (Signed)
MEDICATION-RELATED CONSULT NOTE   IR Procedure Consult - Anticoagulant/Antiplatelet PTA/Inpatient Med List Review by Pharmacist    Procedure: CT-guided abdominal fluid collection and repositioning/upsizing of gastrostomy tube.    Completed: 5/9 @ 1523  Post-Procedural bleeding risk per IR MD assessment:  Received consults for standard and low bleeding risk (looking as though one for each procedure despite being done at the same time)- will utilize standard since it has higher risk  Antithrombotic medications on inpatient or PTA profile prior to procedure:     Lovenox 40mg  subQ q24h  Recommended restart time per IR Post-Procedure Guidelines:   D#1- next day.  Other considerations:  Bloody fluid drained   Plan:     Lovenox 40mg  subQ q24h starting 5/10 at 1400 as already ordered  Amreen Raczkowski D. Shanekqua Schaper, PharmD, BCPS Clinical Pharmacist Pager: [phone] 08/04/2016 4:00 PM

## 2016-08-04 NOTE — Sedation Documentation (Signed)
MD in room checking GTube

## 2016-08-04 NOTE — Sedation Documentation (Signed)
Gtube has been exchanged, pt tolerated well

## 2016-08-04 NOTE — Progress Notes (Addendum)
Initial Nutrition Assessment  DOCUMENTATION CODES:   Severe malnutrition in context of chronic illness  INTERVENTION:   -Once medically able, recommend:  Initiate Osmolite 1.5 @ 25 ml/hr via PEG and increase by 10 ml every 4 hours to goal rate of 55 ml/hr over 18 hour period (1500-0700).   Tube feeding regimen provides 1485kcal (85% of needs), 62grams of protein, and 714ml of H2O  -Will monitor closely for diet advancement and adequacy of PO intake. Pt may benefit from adjustment of TF regimen to provide additional calories and protein in light of declining poor oral intake to prevent further weight loss.   NUTRITION DIAGNOSIS:   Malnutrition related to chronic illness (sigmoid diverticulitis) as evidenced by moderate depletion of body fat, moderate depletions of muscle mass, severe depletion of muscle mass, percent weight loss.  GOAL:   Patient will meet greater than or equal to 90% of their needs  MONITOR:   PO intake, Diet advancement, Labs, Weight trends, Skin, I & O's  REASON FOR ASSESSMENT:   Malnutrition Screening Tool, Consult Assessment of nutrition requirement/status  ASSESSMENT:   66--year-old gentleman who is one-month status post colostomy takedown and repair of a large incisional hernia. His postoperative course was compensated by anastomotic bleeding, G-tube dislodgment,and intra-abdominal abscesses.   Interventional radiology placed percutaneous drains in the abscesses. These resolved on follow-up CAT scan last week and were removed. He has had persistent fevers and generalized malaise.  I needle aspirated a small seroma in his right upper quadrant did not appear infected.  Pt admitted with recurrent intra-abdominal abscess.   Pt awaiting abdominal abscess drain placement by IR today. Per surgery notes, concerning for anastomotic leak vs G-tube leak vs peritonitis.   Pt very familiar to this RD due to multiple previous admissions. Pt in good spirits today;  he reports he was tolerating home TF regimen well PTA with no changes in regimen (home regimen is as follows: Osmolite 1.5 @ 15ml/hr via PEG over 18 hour period (1500-0700). Tube feeding regimen provides 1485kcal (85% of needs), 62grams of protein, and 714ml of H2O). Pt reports last TF administration 1 day PTA.   Pt with chronically poor PO intake at baseline, however, pt reports he has eaten "next to nothing" since undergoing colostomy takedown on 06/28/16 (typical intake is fairly limited at baseline- mainly cream soups, clear liquids, and [place] stew- pt with historically poor tolerance to oral nutrition supplements). He also complains of pain from g-tube site.  Pt endorses a 12# weight loss over the past 3-4 weeks, however, this is not consistent with wt hx. Noted a 23# (15.8%) wt loss over the past 6 months, which is significant for time frame.   Nutrition-Focused physical exam completed. Findings are moderate fat depletion, moderate to severe muscle depletion, and no edema. Pt with more pronounced fat and muscle depletion and appears more frail from RD's recollection from last assessment.   Pt denies any further nutritional needs, but expressed appreciation for visit.   Labs reviewed: Na: 132.   Diet Order:  Diet NPO time specified Except for: Ice Chips, Sips with Meds  Skin:  Reviewed, no issues  Last BM:  08/04/16  Height:   Ht Readings from Last 1 Encounters:  08/03/16 5\' 3"  (1.6 m)    Weight:   Wt Readings from Last 1 Encounters:  08/03/16 123 lb (55.8 kg)    Ideal Body Weight:  56.4 kg  BMI:  Body mass index is 21.79 kg/m.  Estimated Nutritional Needs:  Kcal:  1750-1950  Protein:  90-105 grams  Fluid:  >1.7 L  EDUCATION NEEDS:   No education needs identified at this time  Knut Rondinelli A. Jimmye Norman, RD, LDN, CDE Pager: [phone] After hours Pager: [phone]

## 2016-08-04 NOTE — Sedation Documentation (Signed)
Patient is resting comfortably. 

## 2016-08-04 NOTE — Consult Note (Signed)
   [facility] CM Inpatient Consult   08/04/2016  Miguel [name] Oct 01, 1950 [id number]    Came to visit [name]. He is well known to Probation officer from past hospitalization bedside visits on behalf of [place] [facility] Wellness program for [employer] employees/dependents with [facility] insurance. [name] was off the unit for procedure. Spoke with daughter, also well known to Probation officer, in room. Will come back at later time.     [name], MSN-Ed, RN,BSN [facility] Liaison [phone]

## 2016-08-04 NOTE — Progress Notes (Addendum)
1520 Received pt back from IR. LLQ drain connected to a drainage bag( by gravity) dressing dry and intact. G-tube dressing dry and intact. Pt denies pain at this time. 1855 Pt wants to know if he can have his tube feeding tonight. Dr Brantley Stage was paged, no tube feeding tonight yet.

## 2016-08-04 NOTE — Sedation Documentation (Signed)
Starting [place] check procedure

## 2016-08-04 NOTE — Sedation Documentation (Signed)
Fentanyl given to aid with comfort of procedure.

## 2016-08-04 NOTE — Procedures (Signed)
Successful fluoroscopic guided replacement, repositioning and upsizing of a new 22 Fr gastrostomy tube.  No immediate post procedural complications.  The feeding tube is ready for immediate use.  Ronny Bacon, MD Pager #: [phone]

## 2016-08-04 NOTE — Consult Note (Signed)
Chief Complaint: Patient was seen in consultation today for abdominal abscess drain placement (poss x 2) at the request of Dr Evlyn Courier  Referring Physician(s): Dr Evlyn Courier  Supervising Physician: Sandi Mariscal  Patient Status: [facility] - In-pt  History of Present Illness: Miguel [name] is a 66 y.o. male   Known to IR Hx colostomy takedown 1 mo ago Repair incisional hernia; anastomotic bleeding and intra abdominal abscesses 2 drains were placed in IR 07/07/2016; additional drain was placed 07/17/16 All removed after CT 5/1: IMPRESSION: 1. All 3 of the percutaneously drained fluid collections are decompressed with no significant residual fluid identified. No new fluid collections identified. 2. Scattered pockets of free intraperitoneal air are seen, primarily in the upper abdomen. 3. The gastrostomy tube shows stable positioning with the tip located in the gastric lumen.  Pt now returns to hospital with fever; abd pain Admitted to [facility] last night CT 5/8: 1. Reaccumulation of pelvic abscess after percutaneous drain removal, currently 9.5 x 3.9 cm. Air component of this collection extends through a defect in the anterior abdominal wall into the subcutaneous tissues. Smaller U shaped pelvic fluid collection also seen more inferiorly. 2. Persistent foci of free air within the abdomen. Given re-appearance of pelvic abscess and persistent free air, findings are suspicious for anastomotic leak from the sigmoid colon, with probable bowel wall thickening in the region of enteric sutures. 3. Gastrostomy tube balloon has backed out and is now in the anterior abdominal wall. Air-filled tract extending to the residual gastric lumen is likely non radiopaque gastric tubing. 4. Unchanged rounded heterogeneous fluid collection in the right lower anterior abdominal wall  Request for replacement of abscess drain catheter - (poss x 2) Dr Pascal Lux has reviewed imaging and approves LLQ drain and  poss Rt lateral abd drain placement   Past Medical History:  Diagnosis Date  . Anemia   . Anxiety   . BPH (benign prostatic hyperplasia)   . Gastritis   . GERD (gastroesophageal reflux disease)    "seldom" (07/16/2016)  . GI bleed due to NSAIDs 10/27/2015  . History of blood transfusion 06/2016   post OR/notes 07/15/2016  . History of hiatal hernia   . History of kidney stones   . Hyperlipidemia   . Hypertension   . Melanoma of back ([place])    "mid back"  . Sigmoid diverticulitis    with perforation    Past Surgical History:  Procedure Laterality Date  . COLON SURGERY     sigmoid  . COLOSTOMY TAKEDOWN N/A 06/28/2016   Procedure: COLOSTOMY TAKEDOWN;  Surgeon: [name], MD;  Location: [place];  Service: General;  Laterality: N/A;  . ESOPHAGOGASTRODUODENOSCOPY N/A 10/27/2015   Procedure: ESOPHAGOGASTRODUODENOSCOPY (EGD);  Surgeon: Clarene Essex, MD;  Location: [facility] ENDOSCOPY;  Service: Endoscopy;  Laterality: N/A;  . ESOPHAGOGASTRODUODENOSCOPY N/A 10/29/2015   Procedure: ESOPHAGOGASTRODUODENOSCOPY (EGD);  Surgeon: Ronald Lobo, MD;  Location: [facility] ENDOSCOPY;  Service: Endoscopy;  Laterality: N/A;  . ESOPHAGOGASTRODUODENOSCOPY N/A 10/30/2015   Procedure: ESOPHAGOGASTRODUODENOSCOPY (EGD);  Surgeon: Clarene Essex, MD;  Location: [facility] ENDOSCOPY;  Service: Endoscopy;  Laterality: N/A;  . GASTROSTOMY TUBE PLACEMENT  11/21/2015   REDUCTION OF HIATAL HERNIA , REPAIR HIATAL HERNIA, RESECTION SMALL BOWEL WITH ANASTOMOSIS, PLACEMENT GASTROSTOMY TUBE, PLACEMENT DUODENOSTOMY TUBE (N/A)  . HEMORRHOID BANDING  X 2  . HERNIA REPAIR    . HIATAL HERNIA REPAIR N/A 11/21/2015   Procedure: REDUCTION OF HIATAL HERNIA , REPAIR HIATAL HERNIA, RESECTION SMALL BOWEL WITH ANASTOMOSIS, PLACEMENT GASTROSTOMY  TUBE, PLACEMENT DUODENOSTOMY TUBE;  Surgeon: Mickeal Skinner, MD;  Location: [place];  Service: General;  Laterality: N/A;  . [place]  06/28/2016   open/notes 07/15/2016  . IR GUIDED DRAIN W  CATHETER PLACEMENT  07/06/2016   /NOTES 07/15/2016  . KNEE CARTILAGE SURGERY Right 1971   "opened me up"  . LAPAROTOMY N/A 07/05/2015   Procedure: PARTIAL SIGMOID COLECTOMY AND COLOSTOMY;  Surgeon: [name], MD;  Location: [place];  Service: General;  Laterality: N/A;  . MELANOMA EXCISION  2001  . 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;  Service: General;;  . REPAIR OF PERFORATED ULCER N/A 10/30/2015   Procedure: REPAIR OF BLEEDING  ULCER;  Surgeon: Judeth Horn, MD;  Location: [place];  Service: General;  Laterality: N/A;  . TUMOR EXCISION  2009   "back; fatty tumor"    Allergies: Patient has no known allergies.  Medications: Prior to Admission medications   Medication Sig Start Date End Date Taking? Authorizing Provider  acetaminophen (TYLENOL) 500 MG tablet Take 1 tablet (500 mg total) by mouth every 8 (eight) hours as needed for mild pain (for pain). Patient taking differently: Take 500 mg by mouth every 8 (eight) hours as needed for mild pain or fever.  12/05/15  Yes Jill Alexanders, PA-C  aspirin EC 81 MG tablet Take 81 mg by mouth daily after supper.    Yes [provider]  doxycycline (VIBRAMYCIN) 100 MG capsule Take 100 mg by mouth 2 (two) times daily. For 10 days 08/01/16  Yes [provider]  famotidine (PEPCID) 20 MG tablet Take 1 tablet (20 mg total) by mouth 2 (two) times daily. 11/09/15  Yes Elwin Mocha, MD  ferrous sulfate 325 (65 FE) MG tablet Take 325 mg by mouth daily.   Yes [provider]  Multiple Vitamin (MULTIVITAMIN WITH MINERALS) TABS tablet Take 1 tablet by mouth daily.   Yes [provider]  Nutritional Supplements (FEEDING SUPPLEMENT, OSMOLITE 1.5 CAL,) LIQD Place 1,000 mLs into feeding tube continuous.   Yes [provider]  Omega-3 Fatty Acids (FISH OIL) 1200 MG CAPS Take 1,200 mg by mouth daily.    Yes  [provider]  ondansetron (ZOFRAN) 4 MG tablet Take 4 mg by mouth as needed for nausea or vomiting.  07/23/16  Yes [provider]  oxyCODONE (OXY IR/ROXICODONE) 5 MG immediate release tablet Take 5 mg by mouth every 6 (six) hours as needed for severe pain.   Yes [provider]  Probiotic Product (ALIGN) 4 MG CAPS Take 4 mg by mouth daily.    Yes [provider]  Red Yeast Rice Extract 300 MG CAPS Take 300 mg by mouth daily.   Yes [provider]  tamsulosin (FLOMAX) 0.4 MG CAPS capsule Take 1 capsule (0.4 mg total) by mouth daily. 01/04/16  Yes Fanny Skates, MD  traMADol (ULTRAM) 50 MG tablet Take 50 mg by mouth every 6 (six) hours as needed for moderate pain.  05/21/16  Yes [provider]  zolpidem (AMBIEN) 10 MG tablet Take 5-10 mg by mouth at bedtime as needed for sleep.    Yes [provider]  diclofenac sodium (VOLTAREN) 1 % GEL Apply 1 application topically 2 (two) times daily as needed (back and shoulder pain).    [provider]  HYDROcodone-acetaminophen (NORCO/VICODIN) 5-325 MG tablet Take 1-2 tablets  by mouth every 4 (four) hours as needed for moderate pain. Patient not taking: Reported on 08/03/2016 07/13/16   [name], MD  sodium chloride flush (NS) 0.9 % SOLN 10 mLs by Other route daily. Patient not taking: Reported on 08/03/2016 07/21/16   Jill Alexanders, PA-C     Family History  Problem Relation Age of Onset  . Stroke Mother   . Stroke Brother   . Heart disease Brother     Social History   Social History  . Marital status: Married    Spouse name: N/A  . Number of children: N/A  . Years of education: N/A   Occupational History  . unable to work since April    Social History Main Topics  . Smoking status: Never Smoker  . Smokeless tobacco: Former Systems developer    Types: Snuff    Quit date: 07/13/2016  . Alcohol use No  . Drug use: No  . Sexual activity: Yes   Other Topics Concern  .  None   Social History Narrative  . None    Review of Systems: A 12 point ROS discussed and pertinent positives are indicated in the HPI above.  All other systems are negative.  Review of Systems  Constitutional: Positive for activity change, appetite change, fatigue and fever.  Gastrointestinal: Positive for abdominal pain and nausea.  Neurological: Positive for weakness.  Psychiatric/Behavioral: Negative for behavioral problems and confusion.    Vital Signs: BP 110/65 (BP Location: Right Arm)   Pulse 100   Temp 99.5 F (37.5 C) (Oral)   Resp 18   Ht 5\' 3"  (1.6 m)   Wt 123 lb (55.8 kg)   SpO2 95%   BMI 21.79 kg/m   Physical Exam  Constitutional: He is oriented to person, place, and time.  Cardiovascular: Normal rate and regular rhythm.   Pulmonary/Chest: Effort normal and breath sounds normal.  Abdominal: Soft. Bowel sounds are normal. There is tenderness.  Musculoskeletal: Normal range of motion.  Neurological: He is alert and oriented to person, place, and time.  Skin: Skin is warm and dry.  Psychiatric: He has a normal mood and affect. His behavior is normal. Judgment and thought content normal.  Nursing note and vitals reviewed.   Mallampati Score:  MD Evaluation Airway: WNL Heart: WNL Abdomen: WNL Chest/ Lungs: WNL ASA  Classification: 3 Mallampati/Airway Score: One  Imaging: Ct Abdomen Pelvis W Contrast  Result Date: 08/03/2016 CLINICAL DATA:  Follow-up postop infection. Intra-abdominal abscesses post percutaneous drainage. Continued fever and malaise. EXAM: CT ABDOMEN AND PELVIS WITH CONTRAST TECHNIQUE: Multidetector CT imaging of the abdomen and pelvis was performed using the standard protocol following bolus administration of intravenous contrast. CONTRAST:  155mL ISOVUE-300 IOPAMIDOL (ISOVUE-300) INJECTION 61% COMPARISON:  CT 07/27/2016, additional priors FINDINGS: Lower chest: Mild dependent atelectasis at the lung bases, minimally increased from prior  exam. Trace pleural thickening/effusions. Hepatobiliary: Tiny hepatic hypodensities are unchanged from prior exam. No no focal hepatic abnormality. Mild gallbladder distention containing minimal dependent sludge/ stones. No biliary dilatation. Pancreas: No ductal dilatation or inflammation. Spleen: Trace residual perisplenic fluid, previous left upper quadrant drain has been removed. Smaller fluid collection extends just inferior to the splenic tip at site of prior drainage catheter. No focal splenic lesion. Adrenals/Urinary Tract: No adrenal nodule. No hydronephrosis. Symmetric renal enhancement and excretion. Simple cyst in the mid upper right kidney. Bladder is physiologically distended, minimal bladder wall thickening is likely reactive. Stomach/Bowel: Gastrostomy balloon appears retracted, possibly within the musculature of the  anterior abdominal wall. Air extends from the balloon into the gastric lumen (patient is post partial gastrectomy), which likely represents the non radiopaque distal tip, similar in appearance to prior exam. There is enteric contrast within the nonobstructed noninflamed small bowel reaching the level of the hepatic flexure of the colon. Re-accumulation of pelvic air fluid collection after removal of pelvic drain. Current collection measures 9.5 x 3.9 cm with a air extending into the through defect in the anterior abdominal wall to the subcutaneous tissues. A separate U shaped pelvic collection measuring 5.3 x 2.0 cm is just posterior to the urinary bladder. Anastomotic sutures in the sigmoid colon with wall thickening. Multiple small foci of free air adjacent to the anastomotic sutures, as well as multiple pockets of free air throughout the anterior and upper abdomen. Vascular/Lymphatic: Normal caliber abdominal aorta, mild atherosclerosis. Mall retroperitoneal nodes are unchanged. Reproductive: Enlarged prostate gland again seen. Other: Heterogeneous fluid collection in the right  lateral lower abdominal wall measures 5.1 x 2.9 cm, previously 4.8 x 2.7 cm. Smaller fluid collections are noted in the upper abdomen adjacent to the left lobe of the liver. Defect of the subcutaneous tissues of the upper anterior abdominal wall consistent with postoperative sequela. Musculoskeletal: Stable osseous structures from prior. Stable degenerative change in the spine. IMPRESSION: 1. Reaccumulation of pelvic abscess after percutaneous drain removal, currently 9.5 x 3.9 cm. Air component of this collection extends through a defect in the anterior abdominal wall into the subcutaneous tissues. Smaller U shaped pelvic fluid collection also seen more inferiorly. 2. Persistent foci of free air within the abdomen. Given re-appearance of pelvic abscess and persistent free air, findings are suspicious for anastomotic leak from the sigmoid colon, with probable bowel wall thickening in the region of enteric sutures. 3. Gastrostomy tube balloon has backed out and is now in the anterior abdominal wall. Air-filled tract extending to the residual gastric lumen is likely non radiopaque gastric tubing. 4. Unchanged rounded heterogeneous fluid collection in the right lower anterior abdominal wall, may be hematoma, superimposed infection difficult to exclude. These results will be called to the ordering clinician or representative by the Radiologist Assistant, and communication documented in the PACS or zVision Dashboard. Electronically Signed   By: Jeb Levering M.D.   On: 08/03/2016 18:21   Ct Abdomen Pelvis W Contrast  Result Date: 07/27/2016 CLINICAL DATA:  Status post percutaneous drainage of 2 separate abdominal fluid collections on 07/06/2016 and additional fluid collection on 07/17/2016. EXAM: CT ABDOMEN AND PELVIS WITH CONTRAST TECHNIQUE: Multidetector CT imaging of the abdomen and pelvis was performed using the standard protocol following bolus administration of intravenous contrast. CONTRAST:  134mL  ISOVUE-300 IOPAMIDOL (ISOVUE-300) INJECTION 61% COMPARISON:  07/16/2016 FINDINGS: Lower chest: No acute abnormality. Hepatobiliary: No focal liver abnormality is seen. No gallstones, gallbladder wall thickening, or biliary dilatation. Pancreas: Unremarkable. No pancreatic ductal dilatation or surrounding inflammatory changes. Spleen: Normal in size without focal abnormality. Adrenals/Urinary Tract: Adrenal glands are unremarkable. There is a stable tiny nonobstructing calculus in the lower pole collecting system of the left kidney. Stomach/Bowel: No evidence of acute bowel obstruction. Small scattered pockets of free intraperitoneal air are seen in the peritoneal cavity, primarily in the upper abdomen. The gastrostomy tube shows stable positioning with the tip located in the lumen of the stomach. Vascular/Lymphatic: No enlarged lymph nodes identified. Other: Lateral left upper quadrant percutaneous drainage catheter demonstrates minimal amount of fluid adjacent to the distal catheter. There is a small amount of fluid just posterior to  the spleen. Anterior pelvic drainage catheter present with no residual fluid collection. Posterior right flank drainage catheter extends into a decompressed area with no further residual fluid collection present. No new abscess collections identified. Musculoskeletal: No acute or significant osseous findings. IMPRESSION: 1. All 3 of the percutaneously drained fluid collections are decompressed with no significant residual fluid identified. No new fluid collections identified. 2. Scattered pockets of free intraperitoneal air are seen, primarily in the upper abdomen. 3. The gastrostomy tube shows stable positioning with the tip located in the gastric lumen. Electronically Signed   By: Aletta Edouard M.D.   On: 07/27/2016 17:10   Ct Abdomen Pelvis W Contrast  Result Date: 07/16/2016 CLINICAL DATA:  Status post colostomy reversal, open repair of incisional hernia, intra-abdominal  abscesses status post percutaneous drains. Skin incision separated today with drainage of serous fluid. Fever. EXAM: CT ABDOMEN AND PELVIS WITH CONTRAST TECHNIQUE: Multidetector CT imaging of the abdomen and pelvis was performed using the standard protocol following bolus administration of intravenous contrast. CONTRAST:  164mL ISOVUE-300 IOPAMIDOL (ISOVUE-300) INJECTION 61% COMPARISON:  CT prior to percutaneous drain placement 07/06/2016 FINDINGS: Lower chest: Small left pleural effusion. Adjacent compressive atelectasis in the left lower lobe. Right lower lobe atelectasis without pleural fluid. Patulous distal esophagus. Postsurgical change at the gastroesophageal junction. Hepatobiliary: Tiny hypodensity in the right lobe of the liver are, unchanged. Pancreas: Atrophic parenchyma. No ductal dilatation or inflammation. Spleen: Normal in size.  No focal abnormality. Adrenals/Urinary Tract: Bilateral nonobstructing nephrolithiasis. No hydronephrosis or perinephric edema. Normal adrenal glands. Right renal cyst is unchanged. Urinary bladder is thick walled, chronic. Stomach/Bowel: Post partial gastrectomy. Enteric tube in place, presumably tenting the residual gastric lumen. Gastrojejunostomy appears patent, there is no evidence of extraluminal enteric contrast. Enteric contrast throughout small bowel which is nondilated. Multifocal colonic diverticulosis. Enteric sutures at the junction of the descending and sigmoid colon. Colonic wall toes levels ill-defined. Vascular/Lymphatic: He aortic atherosclerosis. No retroperitoneal or pelvic adenopathy. Limited assessment for mesenteric adenopathy. Reproductive: Enlarged prostate gland again seen. Other: Multiple intra-abdominal abscesses. Percutaneous strain in left upper quadrant fluid collection, with significant decrease in size from prior CT. Small volume of persistent loculated fluid persists. Percutaneous drain in the left lower abdomen with near complete resolution  of large anterior pelvic fluid collection. Persistent peripherally enhancing fluid collection in the mid pelvis measuring 11 x 4 cm, previously 11 x 5.8 cm. This may be an serpiginous continuity with a deep pelvic fluid collection that measures 3.9 x 3.5 just posterior to the urinary bladder. Persistent air-fluid levels/free air in the anterior abdomen, seen adjacent to the lower percutaneous strain and tracking into the right upper quadrant. Extraluminal air collection in the right upper quadrant cause apparent adhesions anterior to the liver. Air from this collection tracks to the midline anteriorly to the anterior abdominal wall were there is defect of the anterior abdominal wall skin. Possible communication within the anterior midline abdomen to smaller fluid collection measuring 4.1 x 1.0 cm. Small foci of extraluminal air persist in the right upper quadrant and anteriorly. Musculoskeletal: Stable osseous structures from prior. IMPRESSION: 1. Multiple intra-abdominal abscesses and air-fluid collections, post percutaneous drainage of dominant left upper quadrant and anterior pelvic abscesses. Percutaneous drainage results in significant improvement in these collections. 2. Persistent lower abdominal/ pelvic abscess in the right midline measuring 11 x 4 cm, possibly in serpiginous continuity with a smaller pelvic abscess. Smaller fluid collections elsewhere persist. 3. Free air in the upper abdomen, with some loculations in the  extraluminal air in the right upper quadrant. This is in the region of previous air-fluid levels in fluid collections, felt to be secondary to inflammatory process. No evidence of acute bowel perforation. No extraluminal enteric contrast. 4. Defect of the midline anterior abdominal wall incision with air tracking just deep to the incision. Possible communication of this midline defect to a small fluid collection anteriorly measuring 4.1 x 1.0 cm. Electronically Signed   By: Jeb Levering M.D.   On: 07/16/2016 03:43   Ct Abdomen Pelvis W Contrast  Result Date: 07/05/2016 CLINICAL DATA:  History of recent colostomy reversal with fever and pain, initial encounter EXAM: CT ABDOMEN AND PELVIS WITH CONTRAST TECHNIQUE: Multidetector CT imaging of the abdomen and pelvis was performed using the standard protocol following bolus administration of intravenous contrast. CONTRAST:  168mL ISOVUE-300 IOPAMIDOL (ISOVUE-300) INJECTION 61% COMPARISON:  12/25/2015 FINDINGS: Lower chest: Bilateral lower lobe consolidation is noted right slightly greater than left. Small associated pleural effusions are noted bilaterally. Hepatobiliary: The liver itself is within normal limits as is the gallbladder. No biliary ductal dilatation is seen. A small amount of free air is noted surrounding the liver consistent with the recent surgery. Pancreas: Unremarkable. No pancreatic ductal dilatation or surrounding inflammatory changes. Spleen: The spleen itself is within normal limits. Adjacent to the spleen and extending along the rope left pericolic gutter there is a large crests enteric air-fluid collection which measures approximately 17 cm in greatest AP dimension and approximately 4.9 cm in greatest transverse dimension. It has a somewhat multiloculated appearance superiorly along the posterior margin of the spleen. Adrenals/Urinary Tract: The adrenal glands are within normal limits. The kidneys again demonstrate a right renal cyst stable from the prior exam. Tiny nonobstructing calculi are again seen bilaterally. The bladder is partially distended without focal abnormality. Stomach/Bowel: There is been interval take down of the patient's left lower quadrant ostomy and reduction of the parastomal hernia. 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 The previously seen duodenostomy tube has been removed. Postsurgical changes consistent with a reanastomosis are noted in the colon. Adjacent to these postsurgical  changes there is some air identified best noted on image number 69 of series 3. It would be difficult to exclude a breakdown of the anastomosis in this region. The stomach is distended with contrast material in there is contrast within the distal esophagus as well this likely represents reflux. A gastrostomy catheter is noted anteriorly within the gastric lumen. Vascular/Lymphatic: Aortic atherosclerosis. No enlarged abdominal or pelvic lymph nodes. Reproductive: Prostate is unremarkable. Other: The air-fluid level in the left abdomen described above. Additionally there are at least 2 other fluid collections 1 in the mid abdomen best seen on image number 61 of series 3 measuring approximately 9.5 by 3.6 cm in greatest transverse and AP dimensions. A third fluid collection is noted deep within the pelvis measuring approximately 11.2 cm by 5.8 cm best seen on image number 78 of series 3. This also appears to have a small continuation with a larger collection measuring approximately 15 cm in greatest transverse dimension in the anterior aspect of the abdomen best seen on image number 73 of series 3. These do not appear to be related to any loops of bowel and are consistent with postoperative air-fluid collections an likely developing abscesses. A fourth air-fluid collection is noted just beneath the anterior abdominal wall best seen on image number 47 of series 3 in a somewhat bilobed fashion. This may simply represent free air and free fluid  as opposed to a developing collection. Surgical drains are noted in the subcutaneous fat of the anterior abdominal wall consistent with the given clinical history. Musculoskeletal: No acute bony abnormality is noted. IMPRESSION: Bilateral lower lobe consolidation and associated effusions. Postsurgical changes within the colon consistent with the recent colostomy takedown as well as postsurgical changes related to the hernia repair. There are multiple air-fluid collections as  described throughout the abdomen suspicious for postoperative abscesses. Additionally some free air is noted as well as possible fluid loculated anteriorly beneath the anterior abdominal wall. Stable nonobstructing bilateral renal calculi. The previously seen bladder calculi are appreciated within small bladder diverticulum although not mentioned in the body of the report. These results will be called to the ordering clinician or representative by the Radiologist Assistant, and communication documented in the PACS or zVision Dashboard. Electronically Signed   By: Inez Catalina M.D.   On: 07/05/2016 14:24   Dg Abd Portable 1v  Result Date: 07/11/2016 CLINICAL DATA:  Abdominal distension EXAM: PORTABLE ABDOMEN - 1 VIEW COMPARISON:  07/06/16 FINDINGS: Scattered large and small bowel gas is noted. Drainage catheter is noted in the mid abdomen inferiorly. Gastrostomy catheter is noted in the left upper quadrant. Postsurgical changes are identified. No obstructive changes are noted. IMPRESSION: Status post percutaneous drainage.  No obstructive changes are seen. Electronically Signed   By: Inez Catalina M.D.   On: 07/11/2016 16:45   Ct Image Guided Fluid Drain By Catheter  Result Date: 07/07/2016 INDICATION: Status post colostomy takedown and development of intra-abdominal peritoneal fluid collections. CT has demonstrated potential partial retraction of a gastrostomy tube abutting the abdominal wall. EXAM: CT-GUIDED PERCUTANEOUS DRAINAGE PERITONEAL ABSCESS FLUID COLLECTIONS X 2 MEDICATIONS: The patient is currently admitted to the hospital and receiving intravenous antibiotics. The antibiotics were administered within an appropriate time frame prior to the initiation of the procedure. ANESTHESIA/SEDATION: Fentanyl 100 mcg IV; Versed 2.0 mg IV Moderate Sedation Time:  45 minutes. The patient was continuously monitored during the procedure by the interventional radiology nurse under my direct supervision.  COMPLICATIONS: None immediate. PROCEDURE: Informed written consent was obtained from the patient after a thorough discussion of the procedural risks, benefits and alternatives. All questions were addressed. Maximal Sterile Barrier Technique was utilized including caps, mask, sterile gowns, sterile gloves, sterile drape, hand hygiene and skin antiseptic. A timeout was performed prior to the initiation of the procedure. Initial CT was performed in a supine position. The indwelling gastrostomy tube was manipulated by first deflating the retention balloon completely. The tube was then gently advance. The inflation balloon was inflated with 8 mL of saline. The tube was then injected with 20 mL of diluted contrast and additional CT images performed. A left lateral upper abdominal peritoneal fluid collection was localized. After inserting an 18 gauge trocar needle into the collection, aspiration of fluid was performed. A guidewire was advanced into the collection. The tract was dilated over the guidewire and a 10 French percutaneous drain placed. A fluid sample was sent for culture analysis. An anterior lower abdominal fluid collection was then localized. An 18 gauge trocar needle was advanced from a left lateral approach. After confirming needle position, a fluid sample was aspirated. A guidewire was advanced. The tract was dilated and a 12 French percutaneous drainage catheter placed. A fluid sample was sent for culture analysis. Both drains were connected to suction bulbs and secured at their exit sites with Prolene retention sutures. StatLock devices were also utilized. FINDINGS: Initial CT demonstrates further migration  of the gastrostomy tube such that the retention balloon lies in the abdominal wall and the tip of the catheter lies barely deep to the abdominal wall musculature. After deflating the retention balloon, the tube was able to be advanced without difficulty back into the lumen of the stomach. Contrast  injection confirms intraluminal position. The left lateral fluid collection immediately inferior to the spleen yielded thin appearing, bloody fluid. A 10 French drain was placed in this collection. Large anterior fluid collection in the lower abdomen and upper pelvis spanning across the midline and containing an air-fluid level clearly was not bowel and yielded foul-smelling, purulent appearing and blood tinged fluid. A 12 French drain was placed in this collection. IMPRESSION: 1. Advancement of gastrostomy tube back into the lumen of the stomach. Initial CT showed further migration of the gastrostomy tube into the abdominal wall. Intraluminal positioning was confirmed with injection of diluted contrast material. 2. Placement of 2 separate percutaneous drainage catheters. A 10 French drain was placed in a left lateral upper abdominal collection yielding thin, bloody fluid. A sample of this fluid was sent for culture analysis. A 12 French drain was placed in an anterior lower abdominal/pelvic collection yielding thicker, purulent and bloody fluid. A sample of this fluid was sent for separate culture analysis. Electronically Signed   By: Aletta Edouard M.D.   On: 07/07/2016 16:44   Ct Image Guided Fluid Drain By Catheter  Result Date: 07/07/2016 INDICATION: Status post colostomy takedown and development of intra-abdominal peritoneal fluid collections. CT has demonstrated potential partial retraction of a gastrostomy tube abutting the abdominal wall. EXAM: CT-GUIDED PERCUTANEOUS DRAINAGE PERITONEAL ABSCESS FLUID COLLECTIONS X 2 MEDICATIONS: The patient is currently admitted to the hospital and receiving intravenous antibiotics. The antibiotics were administered within an appropriate time frame prior to the initiation of the procedure. ANESTHESIA/SEDATION: Fentanyl 100 mcg IV; Versed 2.0 mg IV Moderate Sedation Time:  45 minutes. The patient was continuously monitored during the procedure by the interventional  radiology nurse under my direct supervision. COMPLICATIONS: None immediate. PROCEDURE: Informed written consent was obtained from the patient after a thorough discussion of the procedural risks, benefits and alternatives. All questions were addressed. Maximal Sterile Barrier Technique was utilized including caps, mask, sterile gowns, sterile gloves, sterile drape, hand hygiene and skin antiseptic. A timeout was performed prior to the initiation of the procedure. Initial CT was performed in a supine position. The indwelling gastrostomy tube was manipulated by first deflating the retention balloon completely. The tube was then gently advance. The inflation balloon was inflated with 8 mL of saline. The tube was then injected with 20 mL of diluted contrast and additional CT images performed. A left lateral upper abdominal peritoneal fluid collection was localized. After inserting an 18 gauge trocar needle into the collection, aspiration of fluid was performed. A guidewire was advanced into the collection. The tract was dilated over the guidewire and a 10 French percutaneous drain placed. A fluid sample was sent for culture analysis. An anterior lower abdominal fluid collection was then localized. An 18 gauge trocar needle was advanced from a left lateral approach. After confirming needle position, a fluid sample was aspirated. A guidewire was advanced. The tract was dilated and a 12 French percutaneous drainage catheter placed. A fluid sample was sent for culture analysis. Both drains were connected to suction bulbs and secured at their exit sites with Prolene retention sutures. StatLock devices were also utilized. FINDINGS: Initial CT demonstrates further migration of the gastrostomy tube such that the  retention balloon lies in the abdominal wall and the tip of the catheter lies barely deep to the abdominal wall musculature. After deflating the retention balloon, the tube was able to be advanced without difficulty back  into the lumen of the stomach. Contrast injection confirms intraluminal position. The left lateral fluid collection immediately inferior to the spleen yielded thin appearing, bloody fluid. A 10 French drain was placed in this collection. Large anterior fluid collection in the lower abdomen and upper pelvis spanning across the midline and containing an air-fluid level clearly was not bowel and yielded foul-smelling, purulent appearing and blood tinged fluid. A 12 French drain was placed in this collection. IMPRESSION: 1. Advancement of gastrostomy tube back into the lumen of the stomach. Initial CT showed further migration of the gastrostomy tube into the abdominal wall. Intraluminal positioning was confirmed with injection of diluted contrast material. 2. Placement of 2 separate percutaneous drainage catheters. A 10 French drain was placed in a left lateral upper abdominal collection yielding thin, bloody fluid. A sample of this fluid was sent for culture analysis. A 12 French drain was placed in an anterior lower abdominal/pelvic collection yielding thicker, purulent and bloody fluid. A sample of this fluid was sent for separate culture analysis. Electronically Signed   By: Aletta Edouard M.D.   On: 07/07/2016 16:44   Ct Image Guided Drainage By Percutaneous Catheter  Result Date: 07/18/2016 INDICATION: 66 year old male with a history of abdominal abscess. EXAM: CT GUIDED DRAINAGE OF PELVIC ABSCESS MEDICATIONS: The patient is currently admitted to the hospital and receiving intravenous antibiotics. The antibiotics were administered within an appropriate time frame prior to the initiation of the procedure. ANESTHESIA/SEDATION: 2.0 mg IV Versed 100 mcg IV Fentanyl Moderate Sedation Time:  16 The patient was continuously monitored during the procedure by the interventional radiology nurse under my direct supervision. COMPLICATIONS: None TECHNIQUE: Informed written consent was obtained from the patient after a  thorough discussion of the procedural risks, benefits and alternatives. All questions were addressed. Maximal Sterile Barrier Technique was utilized including caps, mask, sterile gowns, sterile gloves, sterile drape, hand hygiene and skin antiseptic. A timeout was performed prior to the initiation of the procedure. PROCEDURE: Patient is position prone. Scout CT was performed. The posterior right abdomen was prepped with chlorhexidine in a sterile fashion, and a sterile drape was applied covering the operative field. A sterile gown and sterile gloves were used for the procedure. Local anesthesia was provided with 1% Lidocaine. 1% lidocaine was used for local anesthesia. Using CT guidance, 18 gauge trocar needle was advanced into the complex fluid collection of the pelvis. Using modified Seldinger technique and soft tissue dilation, a 10 French drain was placed into the fluid collection within the pelvis. Purulent fluid was aspirated with approximately 100 cc removed. Sample was sent to the lab. Catheter was sutured in position. Final image was stored. Catheter was attached to gravity drainage. Patient tolerated the procedure well and remained hemodynamically stable throughout. No complications were encountered and no significant blood loss. FINDINGS: Scout images demonstrate fluid collection within the pelvis. Final image demonstrates placement of 10 French pigtail catheter into the collection with near complete decompression. IMPRESSION: Status post CT-guided drainage of pelvic fluid collection. Sample was sent to the lab. Signed, Dulcy Fanny. Earleen Newport, DO [facility] Electronically Signed   By: Corrie Mckusick D.O.   On: 07/18/2016 09:02    Labs:  CBC:  Recent Labs  07/15/16 1853 07/18/16 1156 07/19/16 0343  08/03/16 1221  WBC 8.8 13.0* 13.0* 10.5  HGB 8.9* 9.7* 9.5* 9.5*  HCT 26.8* 30.2* 29.6* 30.0*  PLT 639* 665* 649* 442*    COAGS:  Recent  Labs  10/29/15 1851 10/30/15 1245 10/30/15 1636 11/06/15 0453 06/30/16 1605 07/06/16 1053  INR 1.28 1.42 1.34 1.38 1.29 1.15  APTT 29 27 25   --  32  --     BMP:  Recent Labs  07/15/16 1853 07/18/16 1156 07/19/16 0343 08/03/16 1221  NA 131* 128* 131* 132*  K 4.1 4.0 3.9 4.4  CL 96* 95* 97* 94*  CO2 24 27 27 28   GLUCOSE 101* 123* 118* 129*  BUN 9 10 11 15   CALCIUM 7.8* 7.4* 7.6* 8.3*  CREATININE 0.75 0.80 0.79 0.75  GFRNONAA >60 >60 >60 >60  GFRAA >60 >60 >60 >60    LIVER FUNCTION TESTS:  Recent Labs  12/25/15 1736 12/26/15 0636 07/15/16 1853 08/03/16 1221  BILITOT 0.5 0.8 0.5 0.5  AST 26 17 15 18   ALT 21 16* 19 18  ALKPHOS 108 87 76 96  PROT 6.6 5.4* 5.2* 6.5  ALBUMIN 2.6* 2.1* 1.7* 2.2*    TUMOR MARKERS: No results for input(s): AFPTM, CEA, CA199, CHROMGRNA in the last 8760 hours.  Assessment and Plan:  Recurrence abd abscess Scheduled now for abscess drain placement (poss x 2) in IR Risks and Benefits discussed with the patient including bleeding, infection, damage to adjacent structures, bowel perforation/fistula connection, and sepsis. All of the patient's questions were answered, patient is agreeable to proceed. Consent signed and in chart.  Thank you for this interesting consult.  I greatly enjoyed meeting Miguel [name] and look forward to participating in their care.  A copy of this report was sent to the requesting provider on this date.  Electronically Signed: Rustyn Conery A 08/04/2016, 9:17 AM   I spent a total of 40 Minutes    in face to face in clinical consultation, greater than 50% of which was counseling/coordinating care for abd abscess drain

## 2016-08-04 NOTE — Sedation Documentation (Signed)
Pt in IR for Gtube exchange/ possible aspiration. Continuing to monitor VS/LOC

## 2016-08-04 NOTE — Progress Notes (Signed)
Subjective/Chief Complaint: He reports feeling much better this morning Had a normal BM last night Reports that his only abdominal pain is at the G-tube site   Objective: Vital signs in last 24 hours: Temp:  [98.2 F (36.8 C)-102.5 F (39.2 C)] 99.5 F (37.5 C) (05/09 0507) Pulse Rate:  [95-120] 100 (05/09 0507) Resp:  [16-18] 18 (05/09 0507) BP: (105-112)/(65-70) 110/65 (05/09 0507) SpO2:  [95 %-97 %] 95 % (05/09 0507) Weight:  [55.8 kg (123 lb)] 55.8 kg (123 lb) (05/08 1131) Last BM Date: 08/02/16  Intake/Output from previous day: 05/08 0701 - 05/09 0700 In: 1689.6 [I.V.:1639.6; IV Piggyback:50] Out: 1801 [Urine:1800; Stool:1] Intake/Output this shift: No intake/output data recorded.  Exam: Looks comfortable Lungs clear Abdomen soft, almost non tender Wound clean  Lab Results:   Recent Labs  08/03/16 1221  WBC 10.5  HGB 9.5*  HCT 30.0*  PLT 442*   BMET  Recent Labs  08/03/16 1221  NA 132*  K 4.4  CL 94*  CO2 28  GLUCOSE 129*  BUN 15  CREATININE 0.75  CALCIUM 8.3*   PT/INR No results for input(s): LABPROT, INR in the last 72 hours. ABG No results for input(s): PHART, HCO3 in the last 72 hours.  Invalid input(s): PCO2, PO2  Studies/Results: Ct Abdomen Pelvis W Contrast  Result Date: 08/03/2016 CLINICAL DATA:  Follow-up postop infection. Intra-abdominal abscesses post percutaneous drainage. Continued fever and malaise. EXAM: CT ABDOMEN AND PELVIS WITH CONTRAST TECHNIQUE: Multidetector CT imaging of the abdomen and pelvis was performed using the standard protocol following bolus administration of intravenous contrast. CONTRAST:  119mL ISOVUE-300 IOPAMIDOL (ISOVUE-300) INJECTION 61% COMPARISON:  CT 07/27/2016, additional priors FINDINGS: Lower chest: Mild dependent atelectasis at the lung bases, minimally increased from prior exam. Trace pleural thickening/effusions. Hepatobiliary: Tiny hepatic hypodensities are unchanged from prior exam. No no  focal hepatic abnormality. Mild gallbladder distention containing minimal dependent sludge/ stones. No biliary dilatation. Pancreas: No ductal dilatation or inflammation. Spleen: Trace residual perisplenic fluid, previous left upper quadrant drain has been removed. Smaller fluid collection extends just inferior to the splenic tip at site of prior drainage catheter. No focal splenic lesion. Adrenals/Urinary Tract: No adrenal nodule. No hydronephrosis. Symmetric renal enhancement and excretion. Simple cyst in the mid upper right kidney. Bladder is physiologically distended, minimal bladder wall thickening is likely reactive. Stomach/Bowel: Gastrostomy balloon appears retracted, possibly within the musculature of the anterior abdominal wall. Air extends from the balloon into the gastric lumen (patient is post partial gastrectomy), which likely represents the non radiopaque distal tip, similar in appearance to prior exam. There is enteric contrast within the nonobstructed noninflamed small bowel reaching the level of the hepatic flexure of the colon. Re-accumulation of pelvic air fluid collection after removal of pelvic drain. Current collection measures 9.5 x 3.9 cm with a air extending into the through defect in the anterior abdominal wall to the subcutaneous tissues. A separate U shaped pelvic collection measuring 5.3 x 2.0 cm is just posterior to the urinary bladder. Anastomotic sutures in the sigmoid colon with wall thickening. Multiple small foci of free air adjacent to the anastomotic sutures, as well as multiple pockets of free air throughout the anterior and upper abdomen. Vascular/Lymphatic: Normal caliber abdominal aorta, mild atherosclerosis. Mall retroperitoneal nodes are unchanged. Reproductive: Enlarged prostate gland again seen. Other: Heterogeneous fluid collection in the right lateral lower abdominal wall measures 5.1 x 2.9 cm, previously 4.8 x 2.7 cm. Smaller fluid collections are noted in the upper  abdomen adjacent  to the left lobe of the liver. Defect of the subcutaneous tissues of the upper anterior abdominal wall consistent with postoperative sequela. Musculoskeletal: Stable osseous structures from prior. Stable degenerative change in the spine. IMPRESSION: 1. Reaccumulation of pelvic abscess after percutaneous drain removal, currently 9.5 x 3.9 cm. Air component of this collection extends through a defect in the anterior abdominal wall into the subcutaneous tissues. Smaller U shaped pelvic fluid collection also seen more inferiorly. 2. Persistent foci of free air within the abdomen. Given re-appearance of pelvic abscess and persistent free air, findings are suspicious for anastomotic leak from the sigmoid colon, with probable bowel wall thickening in the region of enteric sutures. 3. Gastrostomy tube balloon has backed out and is now in the anterior abdominal wall. Air-filled tract extending to the residual gastric lumen is likely non radiopaque gastric tubing. 4. Unchanged rounded heterogeneous fluid collection in the right lower anterior abdominal wall, may be hematoma, superimposed infection difficult to exclude. These results will be called to the ordering clinician or representative by the Radiologist Assistant, and communication documented in the PACS or zVision Dashboard. Electronically Signed   By: Jeb Levering M.D.   On: 08/03/2016 18:21    Anti-infectives: Anti-infectives    Start     Dose/Rate Route Frequency Ordered Stop   08/03/16 2200  piperacillin-tazobactam (ZOSYN) IVPB 3.375 g     3.375 g 12.5 mL/hr over 240 Minutes Intravenous Every 8 hours 08/03/16 1858     08/03/16 1400  ampicillin-sulbactam (UNASYN) 1.5 g in sodium chloride 0.9 % 50 mL IVPB  Status:  Discontinued     1.5 g 100 mL/hr over 30 Minutes Intravenous Every 6 hours 08/03/16 1320 08/03/16 1858      Assessment/Plan: Recurrent intra-abdominal abscesses  IR consult pending.  I think he needs drains again and  to have his G-tube evaluated again. I am still not certain if this is an anastomotic leak vs a G-tube leak.  Clinically, he is having BM's and his abdomen is fairly benign on exam.  I would expect significant peritonitis given the large amount of free fluid if it were a leak. Hopefully IR can place drains again.  If things remain unclear, he will need a gastrograffin enema to evaluate the anastomosis which was performed 5 weeks ago.  LOS: 1 day    Breeann Reposa A 08/04/2016

## 2016-08-04 NOTE — Sedation Documentation (Signed)
55cc bloody puss looking drainage aspirated

## 2016-08-04 NOTE — Procedures (Signed)
Pre Procedure Dx: Abdominal Wall hematoma Post Procedural Dx: Same  Technically successful US guided aspiration of approximately 20 cc of dark red blood products from right anterior lateral body wall hematoma.  EBL: None  No immediate complications.   Ronny Bacon, MD Pager #: [phone]

## 2016-08-05 LAB — CBC
HEMATOCRIT: 26.9 % — AB (ref 39.0–52.0)
HEMOGLOBIN: 8.3 g/dL — AB (ref 13.0–17.0)
MCH: 27.5 pg (ref 26.0–34.0)
MCHC: 30.9 g/dL (ref 30.0–36.0)
MCV: 89.1 fL (ref 78.0–100.0)
PLATELETS: 439 10*3/uL — AB (ref 150–400)
RBC: 3.02 MIL/uL — AB (ref 4.22–5.81)
RDW: 14.8 % (ref 11.5–15.5)
WBC: 5.6 10*3/uL (ref 4.0–10.5)

## 2016-08-05 MED ORDER — OSMOLITE 1.5 CAL PO LIQD
1000.0000 mL | ORAL | Status: DC
  Administered 2016-08-05 – 2016-08-07 (×3): 1000 mL
  Filled 2016-08-05 (×4): qty 1000

## 2016-08-05 MED ORDER — ZOLPIDEM TARTRATE 5 MG PO TABS
5.0000 mg | ORAL_TABLET | Freq: Every evening | ORAL | Status: DC | PRN
  Administered 2016-08-05 – 2016-08-06 (×2): 5 mg via ORAL
  Filled 2016-08-05 (×2): qty 1

## 2016-08-05 MED ORDER — WHITE PETROLATUM GEL
Status: AC
  Administered 2016-08-05: 21:00:00
  Filled 2016-08-05: qty 1

## 2016-08-05 NOTE — Care Management Note (Signed)
Case Management Note  Patient Details  Name: Miguel [name] MRN: [id number] Date of Birth: 03-19-1951  Subjective/Objective:                    Action/Plan:  No new tube feeding order needed if patient discharges on same tube feeding as he was on prior to admission. Expected Discharge Date:                  Expected Discharge Plan:  Miguel [name]  In-House Referral:     Discharge planning Services  CM Consult  Post Acute Care Choice:  Home Health Choice offered to:  Patient  DME Arranged:    DME Agency:     HH Arranged:  RN [place] Agency:  [facility]  Status of Service:  Completed, signed off  If discussed at [place] of Stay Meetings, dates discussed:    Additional Comments:  Marilu Favre, RN 08/05/2016, 11:42 AM

## 2016-08-05 NOTE — Progress Notes (Signed)
Supervising Physician: Arne Cleveland  Patient Status: [facility] - In-pt  Subjective: Pt feeling much better this am s/p LLQ abscess drain and G-tube upsizing. Also aspirated right ant abdominal wall hematoma   Objective: Physical Exam: BP 107/65 (BP Location: Left Arm)   Pulse 86   Temp 100 F (37.8 C) (Oral)   Resp 18   Ht 5\' 3"  (1.6 m)   Wt 123 lb (55.8 kg)   SpO2 97%   BMI 21.79 kg/m  Abd: soft, mildly tender G-tube intact, site clean LLQ drain intact, site clean, NT Output still cloudy purulent   Current Facility-Administered Medications:  .  acetaminophen (TYLENOL) tablet 650 mg, 650 mg, Oral, Q6H PRN, [name], MD .  acetaminophen (TYLENOL) tablet 650 mg, 650 mg, Per Tube, Q6H PRN, [name], MD, 650 mg at 08/03/16 2042 .  dextrose 5 % and 0.45 % NaCl with KCl 20 mEq/L infusion, , Intravenous, Continuous, [name], MD, Last Rate: 50 mL/hr at 08/05/16 0735 .  enoxaparin (LOVENOX) injection 40 mg, 40 mg, Subcutaneous, Q24H, Turpin, Pamela, PA-C .  feeding supplement (OSMOLITE 1.5 CAL) liquid 1,000 mL, 1,000 mL, Per Tube, Continuous, [name], MD .  MEDLINE mouth rinse, 15 mL, Mouth Rinse, BID, [name], MD, 15 mL at 08/04/16 2205 .  morphine 4 MG/ML injection 1-4 mg, 1-4 mg, Intravenous, Q1H PRN, [name], MD, 4 mg at 08/05/16 0417 .  ondansetron (ZOFRAN) tablet 4 mg, 4 mg, Oral, Q6H PRN, [name], MD .  ondansetron [facility]) tablet 4 mg, 4 mg, Per Tube, Q6H PRN, [name], MD, 4 mg at 08/03/16 2004 .  piperacillin-tazobactam (ZOSYN) IVPB 3.375 g, 3.375 g, Intravenous, Q8H, Ralene Ok, MD, Last Rate: 12.5 mL/hr at 08/05/16 0609, 3.375 g at 08/05/16 0609  Facility-Administered Medications Ordered in Other Encounters:  .  0.9 % NaCl with KCl 20 mEq/ L  infusion, , Intravenous, Continuous, [name], MD .  acetaminophen (TYLENOL) tablet 650 mg, 650 mg, Oral, Q6H PRN **OR** acetaminophen  (TYLENOL) suppository 650 mg, 650 mg, Rectal, Q6H PRN, [name], MD .  morphine 4 MG/ML injection 1-4 mg, 1-4 mg, Intravenous, Q1H PRN, [name], MD .  ondansetron (ZOFRAN-ODT) disintegrating tablet 4 mg, 4 mg, Oral, Q6H PRN **OR** ondansetron (ZOFRAN) 4 mg in sodium chloride 0.9 % 50 mL IVPB, 4 mg, Intravenous, Q6H PRN, [name], MD .  oxyCODONE (Oxy IR/ROXICODONE) immediate release tablet 5-10 mg, 5-10 mg, Oral, Q4H PRN, [name], MD  Labs: CBC  Recent Labs  08/03/16 1221 08/05/16 0350  WBC 10.5 5.6  HGB 9.5* 8.3*  HCT 30.0* 26.9*  PLT 442* 439*   BMET  Recent Labs  08/03/16 1221  NA 132*  K 4.4  CL 94*  CO2 28  GLUCOSE 129*  BUN 15  CREATININE 0.75  CALCIUM 8.3*   LFT  Recent Labs  08/03/16 1221  PROT 6.5  ALBUMIN 2.2*  AST 18  ALT 18  ALKPHOS 96  BILITOT 0.5   PT/INR  Recent Labs  08/04/16 1016  LABPROT 16.0*  INR 1.28     Studies/Results: Ct Abdomen Pelvis W Contrast  Result Date: 08/03/2016 CLINICAL DATA:  Follow-up postop infection. Intra-abdominal abscesses post percutaneous drainage. Continued fever and malaise. EXAM: CT ABDOMEN AND PELVIS WITH CONTRAST TECHNIQUE: Multidetector CT imaging of the abdomen and pelvis was performed using the standard protocol following bolus administration of intravenous contrast. CONTRAST:  181mL ISOVUE-300 IOPAMIDOL (ISOVUE-300) INJECTION 61% COMPARISON:  CT 07/27/2016, additional priors  FINDINGS: Lower chest: Mild dependent atelectasis at the lung bases, minimally increased from prior exam. Trace pleural thickening/effusions. Hepatobiliary: Tiny hepatic hypodensities are unchanged from prior exam. No no focal hepatic abnormality. Mild gallbladder distention containing minimal dependent sludge/ stones. No biliary dilatation. Pancreas: No ductal dilatation or inflammation. Spleen: Trace residual perisplenic fluid, previous left upper quadrant drain has been removed. Smaller fluid  collection extends just inferior to the splenic tip at site of prior drainage catheter. No focal splenic lesion. Adrenals/Urinary Tract: No adrenal nodule. No hydronephrosis. Symmetric renal enhancement and excretion. Simple cyst in the mid upper right kidney. Bladder is physiologically distended, minimal bladder wall thickening is likely reactive. Stomach/Bowel: Gastrostomy balloon appears retracted, possibly within the musculature of the anterior abdominal wall. Air extends from the balloon into the gastric lumen (patient is post partial gastrectomy), which likely represents the non radiopaque distal tip, similar in appearance to prior exam. There is enteric contrast within the nonobstructed noninflamed small bowel reaching the level of the hepatic flexure of the colon. Re-accumulation of pelvic air fluid collection after removal of pelvic drain. Current collection measures 9.5 x 3.9 cm with a air extending into the through defect in the anterior abdominal wall to the subcutaneous tissues. A separate U shaped pelvic collection measuring 5.3 x 2.0 cm is just posterior to the urinary bladder. Anastomotic sutures in the sigmoid colon with wall thickening. Multiple small foci of free air adjacent to the anastomotic sutures, as well as multiple pockets of free air throughout the anterior and upper abdomen. Vascular/Lymphatic: Normal caliber abdominal aorta, mild atherosclerosis. Mall retroperitoneal nodes are unchanged. Reproductive: Enlarged prostate gland again seen. Other: Heterogeneous fluid collection in the right lateral lower abdominal wall measures 5.1 x 2.9 cm, previously 4.8 x 2.7 cm. Smaller fluid collections are noted in the upper abdomen adjacent to the left lobe of the liver. Defect of the subcutaneous tissues of the upper anterior abdominal wall consistent with postoperative sequela. Musculoskeletal: Stable osseous structures from prior. Stable degenerative change in the spine. IMPRESSION: 1.  Reaccumulation of pelvic abscess after percutaneous drain removal, currently 9.5 x 3.9 cm. Air component of this collection extends through a defect in the anterior abdominal wall into the subcutaneous tissues. Smaller U shaped pelvic fluid collection also seen more inferiorly. 2. Persistent foci of free air within the abdomen. Given re-appearance of pelvic abscess and persistent free air, findings are suspicious for anastomotic leak from the sigmoid colon, with probable bowel wall thickening in the region of enteric sutures. 3. Gastrostomy tube balloon has backed out and is now in the anterior abdominal wall. Air-filled tract extending to the residual gastric lumen is likely non radiopaque gastric tubing. 4. Unchanged rounded heterogeneous fluid collection in the right lower anterior abdominal wall, may be hematoma, superimposed infection difficult to exclude. These results will be called to the ordering clinician or representative by the Radiologist Assistant, and communication documented in the PACS or zVision Dashboard. Electronically Signed   By: Jeb Levering M.D.   On: 08/03/2016 18:21   Ir Replc Gastro/colonic Tube Percut W/fluoro  Result Date: 08/04/2016 INDICATION: Malpositioned gastrostomy tube. Please perform fluoroscopic guided exchange, repositioning and up sizing. EXAM: FLUOROSCOPIC GUIDED REPLACEMENT OF GASTROSTOMY TUBE COMPARISON:  CT abdomen pelvis - earlier same day; upper GI series with KUB -12/17/2015 MEDICATIONS: None. CONTRAST:  20 cc Isovue-300 administered into the gastric lumen FLUOROSCOPY TIME:  1 minute (5.3 mGy) COMPLICATIONS: None immediate. PROCEDURE: Informed written consent was obtained from the patient after a discussion of the  risks, benefits and alternatives to treatment. Questions regarding the procedure were encouraged and answered. A timeout was performed prior to the initiation of the procedure. The upper abdomen and external portion of the existing gastrostomy tube was  prepped and draped in the usual sterile fashion, and a sterile drape was applied covering the operative field. Maximum barrier sterile technique with sterile gowns and gloves were used for the procedure. A timeout was performed prior to the initiation of the procedure. The existing gastrostomy tube was injected with a small amount of contrast. External portion of the exact gastrostomy tube was cut and cannulated with an Amplatz wire which was advanced through the gastrostomy tract into the residual gastric remnant. Under intermittent fluoroscopic guidance, a new, slightly larger, now 90 [place] gastrostomy tube was advanced into the residual gastric remnant. The balloon was inflated and disc was cinched. Contrast was injected and several spot fluoroscopic images were obtained in various obliquities to confirm appropriate intraluminal positioning. A dressing was placed. The patient tolerated the procedure well without immediate postprocedural complication. FINDINGS: Fluoroscopic injection of the existing gastrostomy suggests the tube has been retracted outside the expected confines of the gastric remnant as was suggested on preceding abdominal CT. After successful fluoroscopic guided exchange, repositioning and up sizing, the new 22 French gastrostomy tube balloon appears to be appropriately located within the gastric remnant. There is appropriate brisk emptying from the gastric remnant through the gastrojejunostomy. IMPRESSION: Successful fluoroscopic guided replacement, repositioning and up sizing of new 22-French gastrostomy tube. The new gastrostomy tube is ready for immediate use. Electronically Signed   By: Sandi Mariscal M.D.   On: 08/04/2016 17:33   Ir US Guide Bx Asp/drain  Result Date: 08/04/2016 INDICATION: Persistent hematoma involving the anterolateral aspect of the right mid ventral abdominal wall. Please perform ultrasound-guided aspiration for therapeutic and diagnostic purposes. EXAM: IR ULTRASOUND  GUIDANCE COMPARISON:  CT abdomen pelvis - earlier same day MEDICATIONS: The patient is currently admitted to the hospital and receiving intravenous antibiotics. The antibiotics were administered within an appropriate time frame prior to the initiation of the procedure. ANESTHESIA/SEDATION: Moderate (conscious) sedation was employed during this procedure. A total of Versed Fentanyl 100 mcg was administered intravenously. Moderate Sedation Time: 15 minutes. The patient's level of consciousness and vital signs were monitored continuously by radiology nursing throughout the procedure under my direct supervision. CONTRAST:  None COMPLICATIONS: None immediate. PROCEDURE: Informed written consent was obtained from the patient after a discussion of the risks, benefits and alternatives to treatment. Preprocedural ultrasound scanning demonstrated a mixed echogenic fluid collection at the patient's palpable area of concern involving the ventral aspect of the right mid abdomen correlating with the suspected hematoma seen on preceding abdominal CT. A timeout was performed prior to the initiation of the procedure. The skin overlying the palpable abnormality within the lateral aspect of the ventral abdominal wall was prepped and draped in the usual sterile fashion. The overlying soft tissues were anesthetized with 1% lidocaine with epinephrine. Under direct ultrasound guidance, a 18 gauge trocar needle was advanced into the abscess/fluid collection. Ultrasound images were saved for procedural documentation purposes. Next, an Amplatz wire was coiled within the collection. The trocar needle was exchanged for the outer sheath of a 5 [place] Yueh sheath needle. The Yueh sheath was slowly retracted as approximately 20 cc of evolving blood products was aspirated. All aspirated samples were capped and sent to the laboratory for analysis. Next, an additional at least 20 cc of evolving blood products was evacuated  with manual compression.  Superficial hemostasis was achieved with manual compression. A dressing was placed. The patient tolerated the procedure well without immediate postprocedural complication. IMPRESSION: Successful US guided of aspiration of approximately 20 mL of evolving blood products from presumed hematoma involving the lateral at ventral aspect of the right mid abdominal wall. Samples were sent to the laboratory as requested by the ordering clinical team. Electronically Signed   By: Sandi Mariscal M.D.   On: 08/04/2016 17:29   Ct Image Guided Drainage By Percutaneous Catheter  Result Date: 08/04/2016 INDICATION: History of colostomy takedown with development of intra-abdominal fluid collections, post initial percutaneous drainage catheter placement on 07/06/2016 with subsequent right posterolateral percutaneous drainage catheter placement performed on 07/17/2016. Patient was seen in interventional radiology drain Clinic on 07/27/2016 with postprocedural CT scan demonstrating complete resolution of the intra-abdominal fluid collections. As such, both percutaneous drainage catheters were removed at that time. Unfortunately, the patient return to the emergency department yesterday with worsening abdominal pain with CT scan demonstrating recurrence of large air in fluid collecting collection within the midline of the left lower abdomen/pelvis. As such, request made for placement of a new percutaneous drainage catheter for infection source control. EXAM: CT IMAGE GUIDED DRAINAGE BY PERCUTANEOUS CATHETER COMPARISON:  CT scan abdomen pelvis - 08/03/2016; 07/27/2016; 07/05/2016; CT-guided percutaneous drainage catheter placement - 07/06/2016; 07/17/2016 MEDICATIONS: The patient is currently admitted to the hospital and receiving intravenous antibiotics. The antibiotics were administered within an appropriate time frame prior to the initiation of the procedure. ANESTHESIA/SEDATION: Moderate (conscious) sedation was employed during this  procedure. A total of Versed 3 mg and Fentanyl 150 mcg was administered intravenously. Moderate Sedation Time: 20 minutes. The patient's level of consciousness and vital signs were monitored continuously by radiology nursing throughout the procedure under my direct supervision. CONTRAST:  None COMPLICATIONS: None immediate. PROCEDURE: Informed written consent was obtained from the patient after a discussion of the risks, benefits and alternatives to treatment. The patient was placed supine on the CT gantry and a pre procedural CT was performed re-demonstrating the known abscess/fluid collection within the midline of the lower abdomen/ pelvis with dominant air in fluid containing component measuring approximately 3.2 x 8.8 cm (image 46, series 2). The procedure was planned. A timeout was performed prior to the initiation of the procedure. The skin overlying the anterior and lateral aspect of the left lower abdomen/pelvis was prepped and draped in the usual sterile fashion. The overlying soft tissues were anesthetized with 1% lidocaine with epinephrine. Appropriate trajectory was planned with the use of a 22 gauge spinal needle. An 18 gauge trocar needle was advanced into the abscess/fluid collection and a short Amplatz super stiff wire was coiled within the collection. Appropriate positioning was confirmed with a limited CT scan. The tract was serially dilated allowing placement of a 10 [place] all-purpose drainage catheter. Appropriate positioning was confirmed with a limited postprocedural CT scan. Approximately 55 cc of blood tinged, purulent fluid was aspirated. The tube was connected to a drainage bag and sutured in place. A dressing was placed. The patient tolerated the procedure well without immediate post procedural complication. IMPRESSION: Successful CT guided placement of a 10 French all purpose drain catheter into the recurrent air and fluid collection within the midline of the lower abdomen/pelvis with  aspiration of 55 cc of blood tinged purulent fluid. Samples were sent to the laboratory as requested by the ordering clinical team. Electronically Signed   By: Sandi Mariscal M.D.   On: 08/04/2016  17:13    Assessment/Plan: s/p LLQ abscess drain and G-tube upsizing/exchange IR following, but clinically improved already    LOS: 2 days   I spent a total of 20 minutes in face to face in clinical consultation, greater than 50% of which was counseling/coordinating care for abscess drainage  Ascencion Dike PA-C 08/05/2016 9:10 AM

## 2016-08-05 NOTE — Progress Notes (Signed)
Subjective/Chief Complaint: He reports feeling much better No abdominal pain this morning Had normal BM last night   Objective: Vital signs in last 24 hours: Temp:  [99.1 F (37.3 C)-100 F (37.8 C)] 100 F (37.8 C) (05/09 2135) Pulse Rate:  [76-98] 86 (05/09 2135) Resp:  [15-40] 18 (05/09 2135) BP: (94-119)/(56-77) 107/65 (05/09 2135) SpO2:  [96 %-100 %] 97 % (05/09 2135) Last BM Date: 08/04/16  Intake/Output from previous day: 05/09 0701 - 05/10 0700 In: 2990 [I.V.:2875; IV Piggyback:100] Out: 1210 [Urine:1150; Drains:60] Intake/Output this shift: Total I/O In: -  Out: 200 [Urine:200]  Exam: Looks well Abdomen soft, non-tender Drain thick, (tube feeds vs purulence)  Lab Results:   Recent Labs  08/03/16 1221 08/05/16 0350  WBC 10.5 5.6  HGB 9.5* 8.3*  HCT 30.0* 26.9*  PLT 442* 439*   BMET  Recent Labs  08/03/16 1221  NA 132*  K 4.4  CL 94*  CO2 28  GLUCOSE 129*  BUN 15  CREATININE 0.75  CALCIUM 8.3*   PT/INR  Recent Labs  08/04/16 1016  LABPROT 16.0*  INR 1.28   ABG No results for input(s): PHART, HCO3 in the last 72 hours.  Invalid input(s): PCO2, PO2  Studies/Results: Ct Abdomen Pelvis W Contrast  Result Date: 08/03/2016 CLINICAL DATA:  Follow-up postop infection. Intra-abdominal abscesses post percutaneous drainage. Continued fever and malaise. EXAM: CT ABDOMEN AND PELVIS WITH CONTRAST TECHNIQUE: Multidetector CT imaging of the abdomen and pelvis was performed using the standard protocol following bolus administration of intravenous contrast. CONTRAST:  136mL ISOVUE-300 IOPAMIDOL (ISOVUE-300) INJECTION 61% COMPARISON:  CT 07/27/2016, additional priors FINDINGS: Lower chest: Mild dependent atelectasis at the lung bases, minimally increased from prior exam. Trace pleural thickening/effusions. Hepatobiliary: Tiny hepatic hypodensities are unchanged from prior exam. No no focal hepatic abnormality. Mild gallbladder distention containing  minimal dependent sludge/ stones. No biliary dilatation. Pancreas: No ductal dilatation or inflammation. Spleen: Trace residual perisplenic fluid, previous left upper quadrant drain has been removed. Smaller fluid collection extends just inferior to the splenic tip at site of prior drainage catheter. No focal splenic lesion. Adrenals/Urinary Tract: No adrenal nodule. No hydronephrosis. Symmetric renal enhancement and excretion. Simple cyst in the mid upper right kidney. Bladder is physiologically distended, minimal bladder wall thickening is likely reactive. Stomach/Bowel: Gastrostomy balloon appears retracted, possibly within the musculature of the anterior abdominal wall. Air extends from the balloon into the gastric lumen (patient is post partial gastrectomy), which likely represents the non radiopaque distal tip, similar in appearance to prior exam. There is enteric contrast within the nonobstructed noninflamed small bowel reaching the level of the hepatic flexure of the colon. Re-accumulation of pelvic air fluid collection after removal of pelvic drain. Current collection measures 9.5 x 3.9 cm with a air extending into the through defect in the anterior abdominal wall to the subcutaneous tissues. A separate U shaped pelvic collection measuring 5.3 x 2.0 cm is just posterior to the urinary bladder. Anastomotic sutures in the sigmoid colon with wall thickening. Multiple small foci of free air adjacent to the anastomotic sutures, as well as multiple pockets of free air throughout the anterior and upper abdomen. Vascular/Lymphatic: Normal caliber abdominal aorta, mild atherosclerosis. Mall retroperitoneal nodes are unchanged. Reproductive: Enlarged prostate gland again seen. Other: Heterogeneous fluid collection in the right lateral lower abdominal wall measures 5.1 x 2.9 cm, previously 4.8 x 2.7 cm. Smaller fluid collections are noted in the upper abdomen adjacent to the left lobe of the liver. Defect of  the  subcutaneous tissues of the upper anterior abdominal wall consistent with postoperative sequela. Musculoskeletal: Stable osseous structures from prior. Stable degenerative change in the spine. IMPRESSION: 1. Reaccumulation of pelvic abscess after percutaneous drain removal, currently 9.5 x 3.9 cm. Air component of this collection extends through a defect in the anterior abdominal wall into the subcutaneous tissues. Smaller U shaped pelvic fluid collection also seen more inferiorly. 2. Persistent foci of free air within the abdomen. Given re-appearance of pelvic abscess and persistent free air, findings are suspicious for anastomotic leak from the sigmoid colon, with probable bowel wall thickening in the region of enteric sutures. 3. Gastrostomy tube balloon has backed out and is now in the anterior abdominal wall. Air-filled tract extending to the residual gastric lumen is likely non radiopaque gastric tubing. 4. Unchanged rounded heterogeneous fluid collection in the right lower anterior abdominal wall, may be hematoma, superimposed infection difficult to exclude. These results will be called to the ordering clinician or representative by the Radiologist Assistant, and communication documented in the PACS or zVision Dashboard. Electronically Signed   By: Jeb Levering M.D.   On: 08/03/2016 18:21   Ir Replc Gastro/colonic Tube Percut W/fluoro  Result Date: 08/04/2016 INDICATION: Malpositioned gastrostomy tube. Please perform fluoroscopic guided exchange, repositioning and up sizing. EXAM: FLUOROSCOPIC GUIDED REPLACEMENT OF GASTROSTOMY TUBE COMPARISON:  CT abdomen pelvis - earlier same day; upper GI series with KUB -12/17/2015 MEDICATIONS: None. CONTRAST:  20 cc Isovue-300 administered into the gastric lumen FLUOROSCOPY TIME:  1 minute (5.3 mGy) COMPLICATIONS: None immediate. PROCEDURE: Informed written consent was obtained from the patient after a discussion of the risks, benefits and alternatives to  treatment. Questions regarding the procedure were encouraged and answered. A timeout was performed prior to the initiation of the procedure. The upper abdomen and external portion of the existing gastrostomy tube was prepped and draped in the usual sterile fashion, and a sterile drape was applied covering the operative field. Maximum barrier sterile technique with sterile gowns and gloves were used for the procedure. A timeout was performed prior to the initiation of the procedure. The existing gastrostomy tube was injected with a small amount of contrast. External portion of the exact gastrostomy tube was cut and cannulated with an Amplatz wire which was advanced through the gastrostomy tract into the residual gastric remnant. Under intermittent fluoroscopic guidance, a new, slightly larger, now 42 [place] gastrostomy tube was advanced into the residual gastric remnant. The balloon was inflated and disc was cinched. Contrast was injected and several spot fluoroscopic images were obtained in various obliquities to confirm appropriate intraluminal positioning. A dressing was placed. The patient tolerated the procedure well without immediate postprocedural complication. FINDINGS: Fluoroscopic injection of the existing gastrostomy suggests the tube has been retracted outside the expected confines of the gastric remnant as was suggested on preceding abdominal CT. After successful fluoroscopic guided exchange, repositioning and up sizing, the new 22 French gastrostomy tube balloon appears to be appropriately located within the gastric remnant. There is appropriate brisk emptying from the gastric remnant through the gastrojejunostomy. IMPRESSION: Successful fluoroscopic guided replacement, repositioning and up sizing of new 22-French gastrostomy tube. The new gastrostomy tube is ready for immediate use. Electronically Signed   By: Sandi Mariscal M.D.   On: 08/04/2016 17:33   Ir US Guide Bx Asp/drain  Result Date:  08/04/2016 INDICATION: Persistent hematoma involving the anterolateral aspect of the right mid ventral abdominal wall. Please perform ultrasound-guided aspiration for therapeutic and diagnostic purposes. EXAM: IR ULTRASOUND GUIDANCE  COMPARISON:  CT abdomen pelvis - earlier same day MEDICATIONS: The patient is currently admitted to the hospital and receiving intravenous antibiotics. The antibiotics were administered within an appropriate time frame prior to the initiation of the procedure. ANESTHESIA/SEDATION: Moderate (conscious) sedation was employed during this procedure. A total of Versed Fentanyl 100 mcg was administered intravenously. Moderate Sedation Time: 15 minutes. The patient's level of consciousness and vital signs were monitored continuously by radiology nursing throughout the procedure under my direct supervision. CONTRAST:  None COMPLICATIONS: None immediate. PROCEDURE: Informed written consent was obtained from the patient after a discussion of the risks, benefits and alternatives to treatment. Preprocedural ultrasound scanning demonstrated a mixed echogenic fluid collection at the patient's palpable area of concern involving the ventral aspect of the right mid abdomen correlating with the suspected hematoma seen on preceding abdominal CT. A timeout was performed prior to the initiation of the procedure. The skin overlying the palpable abnormality within the lateral aspect of the ventral abdominal wall was prepped and draped in the usual sterile fashion. The overlying soft tissues were anesthetized with 1% lidocaine with epinephrine. Under direct ultrasound guidance, a 18 gauge trocar needle was advanced into the abscess/fluid collection. Ultrasound images were saved for procedural documentation purposes. Next, an Amplatz wire was coiled within the collection. The trocar needle was exchanged for the outer sheath of a 5 [place] Yueh sheath needle. The Yueh sheath was slowly retracted as approximately 20  cc of evolving blood products was aspirated. All aspirated samples were capped and sent to the laboratory for analysis. Next, an additional at least 20 cc of evolving blood products was evacuated with manual compression. Superficial hemostasis was achieved with manual compression. A dressing was placed. The patient tolerated the procedure well without immediate postprocedural complication. IMPRESSION: Successful US guided of aspiration of approximately 20 mL of evolving blood products from presumed hematoma involving the lateral at ventral aspect of the right mid abdominal wall. Samples were sent to the laboratory as requested by the ordering clinical team. Electronically Signed   By: Sandi Mariscal M.D.   On: 08/04/2016 17:29   Ct Image Guided Drainage By Percutaneous Catheter  Result Date: 08/04/2016 INDICATION: History of colostomy takedown with development of intra-abdominal fluid collections, post initial percutaneous drainage catheter placement on 07/06/2016 with subsequent right posterolateral percutaneous drainage catheter placement performed on 07/17/2016. Patient was seen in interventional radiology drain Clinic on 07/27/2016 with postprocedural CT scan demonstrating complete resolution of the intra-abdominal fluid collections. As such, both percutaneous drainage catheters were removed at that time. Unfortunately, the patient return to the emergency department yesterday with worsening abdominal pain with CT scan demonstrating recurrence of large air in fluid collecting collection within the midline of the left lower abdomen/pelvis. As such, request made for placement of a new percutaneous drainage catheter for infection source control. EXAM: CT IMAGE GUIDED DRAINAGE BY PERCUTANEOUS CATHETER COMPARISON:  CT scan abdomen pelvis - 08/03/2016; 07/27/2016; 07/05/2016; CT-guided percutaneous drainage catheter placement - 07/06/2016; 07/17/2016 MEDICATIONS: The patient is currently admitted to the hospital and  receiving intravenous antibiotics. The antibiotics were administered within an appropriate time frame prior to the initiation of the procedure. ANESTHESIA/SEDATION: Moderate (conscious) sedation was employed during this procedure. A total of Versed 3 mg and Fentanyl 150 mcg was administered intravenously. Moderate Sedation Time: 20 minutes. The patient's level of consciousness and vital signs were monitored continuously by radiology nursing throughout the procedure under my direct supervision. CONTRAST:  None COMPLICATIONS: None immediate. PROCEDURE: Informed written consent was  obtained from the patient after a discussion of the risks, benefits and alternatives to treatment. The patient was placed supine on the CT gantry and a pre procedural CT was performed re-demonstrating the known abscess/fluid collection within the midline of the lower abdomen/ pelvis with dominant air in fluid containing component measuring approximately 3.2 x 8.8 cm (image 46, series 2). The procedure was planned. A timeout was performed prior to the initiation of the procedure. The skin overlying the anterior and lateral aspect of the left lower abdomen/pelvis was prepped and draped in the usual sterile fashion. The overlying soft tissues were anesthetized with 1% lidocaine with epinephrine. Appropriate trajectory was planned with the use of a 22 gauge spinal needle. An 18 gauge trocar needle was advanced into the abscess/fluid collection and a short Amplatz super stiff wire was coiled within the collection. Appropriate positioning was confirmed with a limited CT scan. The tract was serially dilated allowing placement of a 10 [place] all-purpose drainage catheter. Appropriate positioning was confirmed with a limited postprocedural CT scan. Approximately 55 cc of blood tinged, purulent fluid was aspirated. The tube was connected to a drainage bag and sutured in place. A dressing was placed. The patient tolerated the procedure well without  immediate post procedural complication. IMPRESSION: Successful CT guided placement of a 10 French all purpose drain catheter into the recurrent air and fluid collection within the midline of the lower abdomen/pelvis with aspiration of 55 cc of blood tinged purulent fluid. Samples were sent to the laboratory as requested by the ordering clinical team. Electronically Signed   By: Sandi Mariscal M.D.   On: 08/04/2016 17:13    Anti-infectives: Anti-infectives    Start     Dose/Rate Route Frequency Ordered Stop   08/03/16 2200  piperacillin-tazobactam (ZOSYN) IVPB 3.375 g     3.375 g 12.5 mL/hr over 240 Minutes Intravenous Every 8 hours 08/03/16 1858     08/03/16 1400  ampicillin-sulbactam (UNASYN) 1.5 g in sodium chloride 0.9 % 50 mL IVPB  Status:  Discontinued     1.5 g 100 mL/hr over 30 Minutes Intravenous Every 6 hours 08/03/16 1320 08/03/16 1858      Assessment/Plan:  Intra-abdominal post op abscess  He has improved quickly after the procedure.  I am still uncertain if this was a G-tube issue, anastomotic issue, or a combo of both.  Will resume tube feeds and monitor. Will leave the drain in several weeks. Consider output gastrograffin enema prior to drain removal Continue IV antibiotics for now  LOS: 2 days    Ladiamond Gallina A 08/05/2016

## 2016-08-05 NOTE — Progress Notes (Signed)
Nutrition Follow-up  DOCUMENTATION CODES:   Severe malnutrition in context of chronic illness  INTERVENTION:   -Continue Osmolite 1.5 @ 25 ml/hr via PEG and increase by 10 ml every 4 hours to goal rate of 55 ml/hr over 18 hour period (1500-0700).   Tube feeding regimen provides 1485kcal (85% of needs), 62grams of protein, and 778ml of H2O  -Will monitor closely for diet advancement and adequacy of PO intake. Pt may benefit from adjustment of TF regimen to provide additional calories and protein in light of declining poor oral intake to prevent further weight loss.   NUTRITION DIAGNOSIS:   Malnutrition related to chronic illness (sigmoid diverticulitis) as evidenced by moderate depletion of body fat, moderate depletions of muscle mass, severe depletion of muscle mass, percent weight loss.  Ongoing  GOAL:   Patient will meet greater than or equal to 90% of their needs  Progressing  MONITOR:   PO intake, Diet advancement, Labs, Weight trends, Skin, I & O's  REASON FOR ASSESSMENT:   Malnutrition Screening Tool, Consult Assessment of nutrition requirement/status  ASSESSMENT:   66--year-old gentleman who is one-month status post colostomy takedown and repair of a large incisional hernia. His postoperative course was compensated by anastomotic bleeding, G-tube dislodgment,and intra-abdominal abscesses.   Interventional radiology placed percutaneous drains in the abscesses. These resolved on follow-up CAT scan last week and were removed. He has had persistent fevers and generalized malaise.  I needle aspirated a small seroma in his right upper quadrant did not appear infected.  s/p LLQ abscess drain and G-tube upsizing/exchange on 08/04/16.   Pt advanced to clear liquid diet. Noted 0% intake.   TF restarted today around 0730. Noted Osmolite 1.2 infusing at 25 ml/hr via PEG, which currently provides 675 kcals, 31 grams protein, and 343 ml fluid daily, meeting 39% of estimated kcal  needs and 345 of estimated protein needs.  Labs reviewed.   Diet Order:  Diet clear liquid Room service appropriate? Yes; Fluid consistency: Thin  Skin:  Reviewed, no issues  Last BM:  08/05/16  Height:   Ht Readings from Last 1 Encounters:  08/03/16 5\' 3"  (1.6 m)    Weight:   Wt Readings from Last 1 Encounters:  08/03/16 123 lb (55.8 kg)    Ideal Body Weight:  56.4 kg  BMI:  Body mass index is 21.79 kg/m.  Estimated Nutritional Needs:   Kcal:  6644-0347  Protein:  90-105 grams  Fluid:  >1.7 L  EDUCATION NEEDS:   No education needs identified at this time  Heman Que A. Jimmye Norman, RD, LDN, CDE Pager: [phone] After hours Pager: [phone]

## 2016-08-06 NOTE — Progress Notes (Signed)
Referring Physician(s): Dr Evlyn Courier  Supervising Physician: Jacqulynn Cadet  Patient Status:  [facility] - In-pt  Chief Complaint:  LLQ hematoma drain placed 5/9  Subjective:  Doing well Feels great Minimal soreness at G tube site No complaints with LLQ drain  Allergies: Patient has no known allergies.  Medications: Prior to Admission medications   Medication Sig Start Date End Date Taking? Authorizing Provider  acetaminophen (TYLENOL) 500 MG tablet Take 1 tablet (500 mg total) by mouth every 8 (eight) hours as needed for mild pain (for pain). Patient taking differently: Take 500 mg by mouth every 8 (eight) hours as needed for mild pain or fever.  12/05/15  Yes Jill Alexanders, PA-C  aspirin EC 81 MG tablet Take 81 mg by mouth daily after supper.    Yes [provider]  doxycycline (VIBRAMYCIN) 100 MG capsule Take 100 mg by mouth 2 (two) times daily. For 10 days 08/01/16  Yes [provider]  famotidine (PEPCID) 20 MG tablet Take 1 tablet (20 mg total) by mouth 2 (two) times daily. 11/09/15  Yes Elwin Mocha, MD  ferrous sulfate 325 (65 FE) MG tablet Take 325 mg by mouth daily.   Yes [provider]  Multiple Vitamin (MULTIVITAMIN WITH MINERALS) TABS tablet Take 1 tablet by mouth daily.   Yes [provider]  Nutritional Supplements (FEEDING SUPPLEMENT, OSMOLITE 1.5 CAL,) LIQD Place 1,000 mLs into feeding tube continuous.   Yes [provider]  Omega-3 Fatty Acids (FISH OIL) 1200 MG CAPS Take 1,200 mg by mouth daily.    Yes [provider]  ondansetron (ZOFRAN) 4 MG tablet Take 4 mg by mouth as needed for nausea or vomiting.  07/23/16  Yes [provider]  oxyCODONE (OXY IR/ROXICODONE) 5 MG immediate release tablet Take 5 mg by mouth every 6 (six) hours as needed for severe pain.   Yes [provider]  Probiotic Product (ALIGN) 4 MG CAPS Take 4 mg by mouth daily.    Yes [provider]  Red Yeast  Rice Extract 300 MG CAPS Take 300 mg by mouth daily.   Yes [provider]  tamsulosin (FLOMAX) 0.4 MG CAPS capsule Take 1 capsule (0.4 mg total) by mouth daily. 01/04/16  Yes Fanny Skates, MD  traMADol (ULTRAM) 50 MG tablet Take 50 mg by mouth every 6 (six) hours as needed for moderate pain.  05/21/16  Yes [provider]  zolpidem (AMBIEN) 10 MG tablet Take 5-10 mg by mouth at bedtime as needed for sleep.    Yes [provider]  diclofenac sodium (VOLTAREN) 1 % GEL Apply 1 application topically 2 (two) times daily as needed (back and shoulder pain).    [provider]  HYDROcodone-acetaminophen (NORCO/VICODIN) 5-325 MG tablet Take 1-2 tablets by mouth every 4 (four) hours as needed for moderate pain. Patient not taking: Reported on 08/03/2016 07/13/16   [name], MD  sodium chloride flush (NS) 0.9 % SOLN 10 mLs by Other route daily. Patient not taking: Reported on 08/03/2016 07/21/16   Jill Alexanders, PA-C     Vital Signs: BP 106/72 (BP Location: Left Arm)   Pulse 86   Temp 97.8 F (36.6 C) (Oral)   Resp 18   Ht 5\' 3"  (1.6 m)   Wt 123 lb (55.8 kg)   SpO2 98%   BMI 21.79 kg/m   Physical Exam  Constitutional: He is oriented to person, place, and time.  Abdominal: Soft. Bowel sounds are  normal. There is no tenderness.  Musculoskeletal: Normal range of motion.  Neurological: He is alert and oriented to person, place, and time.  Skin: Skin is warm and dry.  Skin sites are clean and dry No bleeding  G tube intact; using without difficulty New dressing placed---keep gauze sponges ONLY atop drain   Drain OP is serous; minimal 35 cc recorded yesterday; scant in bag NGTD    Nursing note and vitals reviewed.   Imaging: Ct Abdomen Pelvis W Contrast  Result Date: 08/03/2016 CLINICAL DATA:  Follow-up postop infection. Intra-abdominal abscesses post percutaneous drainage. Continued fever and malaise. EXAM: CT ABDOMEN AND PELVIS WITH  CONTRAST TECHNIQUE: Multidetector CT imaging of the abdomen and pelvis was performed using the standard protocol following bolus administration of intravenous contrast. CONTRAST:  159mL ISOVUE-300 IOPAMIDOL (ISOVUE-300) INJECTION 61% COMPARISON:  CT 07/27/2016, additional priors FINDINGS: Lower chest: Mild dependent atelectasis at the lung bases, minimally increased from prior exam. Trace pleural thickening/effusions. Hepatobiliary: Tiny hepatic hypodensities are unchanged from prior exam. No no focal hepatic abnormality. Mild gallbladder distention containing minimal dependent sludge/ stones. No biliary dilatation. Pancreas: No ductal dilatation or inflammation. Spleen: Trace residual perisplenic fluid, previous left upper quadrant drain has been removed. Smaller fluid collection extends just inferior to the splenic tip at site of prior drainage catheter. No focal splenic lesion. Adrenals/Urinary Tract: No adrenal nodule. No hydronephrosis. Symmetric renal enhancement and excretion. Simple cyst in the mid upper right kidney. Bladder is physiologically distended, minimal bladder wall thickening is likely reactive. Stomach/Bowel: Gastrostomy balloon appears retracted, possibly within the musculature of the anterior abdominal wall. Air extends from the balloon into the gastric lumen (patient is post partial gastrectomy), which likely represents the non radiopaque distal tip, similar in appearance to prior exam. There is enteric contrast within the nonobstructed noninflamed small bowel reaching the level of the hepatic flexure of the colon. Re-accumulation of pelvic air fluid collection after removal of pelvic drain. Current collection measures 9.5 x 3.9 cm with a air extending into the through defect in the anterior abdominal wall to the subcutaneous tissues. A separate U shaped pelvic collection measuring 5.3 x 2.0 cm is just posterior to the urinary bladder. Anastomotic sutures in the sigmoid colon with wall  thickening. Multiple small foci of free air adjacent to the anastomotic sutures, as well as multiple pockets of free air throughout the anterior and upper abdomen. Vascular/Lymphatic: Normal caliber abdominal aorta, mild atherosclerosis. Mall retroperitoneal nodes are unchanged. Reproductive: Enlarged prostate gland again seen. Other: Heterogeneous fluid collection in the right lateral lower abdominal wall measures 5.1 x 2.9 cm, previously 4.8 x 2.7 cm. Smaller fluid collections are noted in the upper abdomen adjacent to the left lobe of the liver. Defect of the subcutaneous tissues of the upper anterior abdominal wall consistent with postoperative sequela. Musculoskeletal: Stable osseous structures from prior. Stable degenerative change in the spine. IMPRESSION: 1. Reaccumulation of pelvic abscess after percutaneous drain removal, currently 9.5 x 3.9 cm. Air component of this collection extends through a defect in the anterior abdominal wall into the subcutaneous tissues. Smaller U shaped pelvic fluid collection also seen more inferiorly. 2. Persistent foci of free air within the abdomen. Given re-appearance of pelvic abscess and persistent free air, findings are suspicious for anastomotic leak from the sigmoid colon, with probable bowel wall thickening in the region of enteric sutures. 3. Gastrostomy tube balloon has backed out and is now in the anterior abdominal wall. Air-filled tract extending to the residual gastric lumen is likely  non radiopaque gastric tubing. 4. Unchanged rounded heterogeneous fluid collection in the right lower anterior abdominal wall, may be hematoma, superimposed infection difficult to exclude. These results will be called to the ordering clinician or representative by the Radiologist Assistant, and communication documented in the PACS or zVision Dashboard. Electronically Signed   By: Jeb Levering M.D.   On: 08/03/2016 18:21   Ir Replc Gastro/colonic Tube Percut W/fluoro  Result  Date: 08/04/2016 INDICATION: Malpositioned gastrostomy tube. Please perform fluoroscopic guided exchange, repositioning and up sizing. EXAM: FLUOROSCOPIC GUIDED REPLACEMENT OF GASTROSTOMY TUBE COMPARISON:  CT abdomen pelvis - earlier same day; upper GI series with KUB -12/17/2015 MEDICATIONS: None. CONTRAST:  20 cc Isovue-300 administered into the gastric lumen FLUOROSCOPY TIME:  1 minute (5.3 mGy) COMPLICATIONS: None immediate. PROCEDURE: Informed written consent was obtained from the patient after a discussion of the risks, benefits and alternatives to treatment. Questions regarding the procedure were encouraged and answered. A timeout was performed prior to the initiation of the procedure. The upper abdomen and external portion of the existing gastrostomy tube was prepped and draped in the usual sterile fashion, and a sterile drape was applied covering the operative field. Maximum barrier sterile technique with sterile gowns and gloves were used for the procedure. A timeout was performed prior to the initiation of the procedure. The existing gastrostomy tube was injected with a small amount of contrast. External portion of the exact gastrostomy tube was cut and cannulated with an Amplatz wire which was advanced through the gastrostomy tract into the residual gastric remnant. Under intermittent fluoroscopic guidance, a new, slightly larger, now 52 [place] gastrostomy tube was advanced into the residual gastric remnant. The balloon was inflated and disc was cinched. Contrast was injected and several spot fluoroscopic images were obtained in various obliquities to confirm appropriate intraluminal positioning. A dressing was placed. The patient tolerated the procedure well without immediate postprocedural complication. FINDINGS: Fluoroscopic injection of the existing gastrostomy suggests the tube has been retracted outside the expected confines of the gastric remnant as was suggested on preceding abdominal CT. After  successful fluoroscopic guided exchange, repositioning and up sizing, the new 22 French gastrostomy tube balloon appears to be appropriately located within the gastric remnant. There is appropriate brisk emptying from the gastric remnant through the gastrojejunostomy. IMPRESSION: Successful fluoroscopic guided replacement, repositioning and up sizing of new 22-French gastrostomy tube. The new gastrostomy tube is ready for immediate use. Electronically Signed   By: Sandi Mariscal M.D.   On: 08/04/2016 17:33   Ir US Guide Bx Asp/drain  Result Date: 08/04/2016 INDICATION: Persistent hematoma involving the anterolateral aspect of the right mid ventral abdominal wall. Please perform ultrasound-guided aspiration for therapeutic and diagnostic purposes. EXAM: IR ULTRASOUND GUIDANCE COMPARISON:  CT abdomen pelvis - earlier same day MEDICATIONS: The patient is currently admitted to the hospital and receiving intravenous antibiotics. The antibiotics were administered within an appropriate time frame prior to the initiation of the procedure. ANESTHESIA/SEDATION: Moderate (conscious) sedation was employed during this procedure. A total of Versed Fentanyl 100 mcg was administered intravenously. Moderate Sedation Time: 15 minutes. The patient's level of consciousness and vital signs were monitored continuously by radiology nursing throughout the procedure under my direct supervision. CONTRAST:  None COMPLICATIONS: None immediate. PROCEDURE: Informed written consent was obtained from the patient after a discussion of the risks, benefits and alternatives to treatment. Preprocedural ultrasound scanning demonstrated a mixed echogenic fluid collection at the patient's palpable area of concern involving the ventral aspect of the right mid  abdomen correlating with the suspected hematoma seen on preceding abdominal CT. A timeout was performed prior to the initiation of the procedure. The skin overlying the palpable abnormality within the  lateral aspect of the ventral abdominal wall was prepped and draped in the usual sterile fashion. The overlying soft tissues were anesthetized with 1% lidocaine with epinephrine. Under direct ultrasound guidance, a 18 gauge trocar needle was advanced into the abscess/fluid collection. Ultrasound images were saved for procedural documentation purposes. Next, an Amplatz wire was coiled within the collection. The trocar needle was exchanged for the outer sheath of a 5 [place] Yueh sheath needle. The Yueh sheath was slowly retracted as approximately 20 cc of evolving blood products was aspirated. All aspirated samples were capped and sent to the laboratory for analysis. Next, an additional at least 20 cc of evolving blood products was evacuated with manual compression. Superficial hemostasis was achieved with manual compression. A dressing was placed. The patient tolerated the procedure well without immediate postprocedural complication. IMPRESSION: Successful US guided of aspiration of approximately 20 mL of evolving blood products from presumed hematoma involving the lateral at ventral aspect of the right mid abdominal wall. Samples were sent to the laboratory as requested by the ordering clinical team. Electronically Signed   By: Sandi Mariscal M.D.   On: 08/04/2016 17:29   Ct Image Guided Drainage By Percutaneous Catheter  Result Date: 08/04/2016 INDICATION: History of colostomy takedown with development of intra-abdominal fluid collections, post initial percutaneous drainage catheter placement on 07/06/2016 with subsequent right posterolateral percutaneous drainage catheter placement performed on 07/17/2016. Patient was seen in interventional radiology drain Clinic on 07/27/2016 with postprocedural CT scan demonstrating complete resolution of the intra-abdominal fluid collections. As such, both percutaneous drainage catheters were removed at that time. Unfortunately, the patient return to the emergency department  yesterday with worsening abdominal pain with CT scan demonstrating recurrence of large air in fluid collecting collection within the midline of the left lower abdomen/pelvis. As such, request made for placement of a new percutaneous drainage catheter for infection source control. EXAM: CT IMAGE GUIDED DRAINAGE BY PERCUTANEOUS CATHETER COMPARISON:  CT scan abdomen pelvis - 08/03/2016; 07/27/2016; 07/05/2016; CT-guided percutaneous drainage catheter placement - 07/06/2016; 07/17/2016 MEDICATIONS: The patient is currently admitted to the hospital and receiving intravenous antibiotics. The antibiotics were administered within an appropriate time frame prior to the initiation of the procedure. ANESTHESIA/SEDATION: Moderate (conscious) sedation was employed during this procedure. A total of Versed 3 mg and Fentanyl 150 mcg was administered intravenously. Moderate Sedation Time: 20 minutes. The patient's level of consciousness and vital signs were monitored continuously by radiology nursing throughout the procedure under my direct supervision. CONTRAST:  None COMPLICATIONS: None immediate. PROCEDURE: Informed written consent was obtained from the patient after a discussion of the risks, benefits and alternatives to treatment. The patient was placed supine on the CT gantry and a pre procedural CT was performed re-demonstrating the known abscess/fluid collection within the midline of the lower abdomen/ pelvis with dominant air in fluid containing component measuring approximately 3.2 x 8.8 cm (image 46, series 2). The procedure was planned. A timeout was performed prior to the initiation of the procedure. The skin overlying the anterior and lateral aspect of the left lower abdomen/pelvis was prepped and draped in the usual sterile fashion. The overlying soft tissues were anesthetized with 1% lidocaine with epinephrine. Appropriate trajectory was planned with the use of a 22 gauge spinal needle. An 18 gauge trocar needle was  advanced into the abscess/fluid  collection and a short Amplatz super stiff wire was coiled within the collection. Appropriate positioning was confirmed with a limited CT scan. The tract was serially dilated allowing placement of a 10 [place] all-purpose drainage catheter. Appropriate positioning was confirmed with a limited postprocedural CT scan. Approximately 55 cc of blood tinged, purulent fluid was aspirated. The tube was connected to a drainage bag and sutured in place. A dressing was placed. The patient tolerated the procedure well without immediate post procedural complication. IMPRESSION: Successful CT guided placement of a 10 French all purpose drain catheter into the recurrent air and fluid collection within the midline of the lower abdomen/pelvis with aspiration of 55 cc of blood tinged purulent fluid. Samples were sent to the laboratory as requested by the ordering clinical team. Electronically Signed   By: Sandi Mariscal M.D.   On: 08/04/2016 17:13    Labs:  CBC:  Recent Labs  07/18/16 1156 07/19/16 0343 08/03/16 1221 08/05/16 0350  WBC 13.0* 13.0* 10.5 5.6  HGB 9.7* 9.5* 9.5* 8.3*  HCT 30.2* 29.6* 30.0* 26.9*  PLT 665* 649* 442* 439*    COAGS:  Recent Labs  10/29/15 1851 10/30/15 1245 10/30/15 1636 11/06/15 0453 06/30/16 1605 07/06/16 1053 08/04/16 1016  INR 1.28 1.42 1.34 1.38 1.29 1.15 1.28  APTT 29 27 25   --  32  --   --     BMP:  Recent Labs  07/15/16 1853 07/18/16 1156 07/19/16 0343 08/03/16 1221  NA 131* 128* 131* 132*  K 4.1 4.0 3.9 4.4  CL 96* 95* 97* 94*  CO2 24 27 27 28   GLUCOSE 101* 123* 118* 129*  BUN 9 10 11 15   CALCIUM 7.8* 7.4* 7.6* 8.3*  CREATININE 0.75 0.80 0.79 0.75  GFRNONAA >60 >60 >60 >60  GFRAA >60 >60 >60 >60    LIVER FUNCTION TESTS:  Recent Labs  12/25/15 1736 12/26/15 0636 07/15/16 1853 08/03/16 1221  BILITOT 0.5 0.8 0.5 0.5  AST 26 17 15 18   ALT 21 16* 19 18  ALKPHOS 108 87 76 96  PROT 6.6 5.4* 5.2* 6.5  ALBUMIN  2.6* 2.1* 1.7* 2.2*    Assessment and Plan:  LLQ drain intact IR drain clinic will call pt with time and date of follow up Order in place Need to flush daily 5-10 cc sterile saline  Electronically Signed: Merit Maybee A 08/06/2016, 8:32 AM   I spent a total of 25 Minutes at the the patient's bedside AND on the patient's hospital floor or unit, greater than 50% of which was counseling/coordinating care for LLQ drain placement

## 2016-08-06 NOTE — Progress Notes (Signed)
Subjective/Chief Complaint: Feels well Ambulating Almost no pain No fever since drain placed and G-tube up-sized   Objective: Vital signs in last 24 hours: Temp:  [97.8 F (36.6 C)-99.1 F (37.3 C)] 97.8 F (36.6 C) (05/11 0557) Pulse Rate:  [82-91] 86 (05/11 0557) Resp:  [18-19] 18 (05/11 0557) BP: (103-113)/(67-72) 106/72 (05/11 0557) SpO2:  [97 %-98 %] 98 % (05/11 0557) Last BM Date: 08/04/16  Intake/Output from previous day: 05/10 0701 - 05/11 0700 In: 1590 [P.O.:60; I.V.:1318.8; NG/GT:151.3; IV Piggyback:50] Out: 635 [Urine:600; Drains:35] Intake/Output this shift: Total I/O In: 720 [P.O.:60; I.V.:650; Other:10] Out: 335 [Urine:300; Drains:35]  Exam: Looks well Abdomen soft, non tender, drain with output the same  Lab Results:   Recent Labs  08/03/16 1221 08/05/16 0350  WBC 10.5 5.6  HGB 9.5* 8.3*  HCT 30.0* 26.9*  PLT 442* 439*   BMET  Recent Labs  08/03/16 1221  NA 132*  K 4.4  CL 94*  CO2 28  GLUCOSE 129*  BUN 15  CREATININE 0.75  CALCIUM 8.3*   PT/INR  Recent Labs  08/04/16 1016  LABPROT 16.0*  INR 1.28   ABG No results for input(s): PHART, HCO3 in the last 72 hours.  Invalid input(s): PCO2, PO2  Studies/Results: Ir Replc Gastro/colonic Tube Percut W/fluoro  Result Date: 08/04/2016 INDICATION: Malpositioned gastrostomy tube. Please perform fluoroscopic guided exchange, repositioning and up sizing. EXAM: FLUOROSCOPIC GUIDED REPLACEMENT OF GASTROSTOMY TUBE COMPARISON:  CT abdomen pelvis - earlier same day; upper GI series with KUB -12/17/2015 MEDICATIONS: None. CONTRAST:  20 cc Isovue-300 administered into the gastric lumen FLUOROSCOPY TIME:  1 minute (5.3 mGy) COMPLICATIONS: None immediate. PROCEDURE: Informed written consent was obtained from the patient after a discussion of the risks, benefits and alternatives to treatment. Questions regarding the procedure were encouraged and answered. A timeout was performed prior to the  initiation of the procedure. The upper abdomen and external portion of the existing gastrostomy tube was prepped and draped in the usual sterile fashion, and a sterile drape was applied covering the operative field. Maximum barrier sterile technique with sterile gowns and gloves were used for the procedure. A timeout was performed prior to the initiation of the procedure. The existing gastrostomy tube was injected with a small amount of contrast. External portion of the exact gastrostomy tube was cut and cannulated with an Amplatz wire which was advanced through the gastrostomy tract into the residual gastric remnant. Under intermittent fluoroscopic guidance, a new, slightly larger, now 47 [place] gastrostomy tube was advanced into the residual gastric remnant. The balloon was inflated and disc was cinched. Contrast was injected and several spot fluoroscopic images were obtained in various obliquities to confirm appropriate intraluminal positioning. A dressing was placed. The patient tolerated the procedure well without immediate postprocedural complication. FINDINGS: Fluoroscopic injection of the existing gastrostomy suggests the tube has been retracted outside the expected confines of the gastric remnant as was suggested on preceding abdominal CT. After successful fluoroscopic guided exchange, repositioning and up sizing, the new 22 French gastrostomy tube balloon appears to be appropriately located within the gastric remnant. There is appropriate brisk emptying from the gastric remnant through the gastrojejunostomy. IMPRESSION: Successful fluoroscopic guided replacement, repositioning and up sizing of new 22-French gastrostomy tube. The new gastrostomy tube is ready for immediate use. Electronically Signed   By: Sandi Mariscal M.D.   On: 08/04/2016 17:33   Ir US Guide Bx Asp/drain  Result Date: 08/04/2016 INDICATION: Persistent hematoma involving the anterolateral aspect of the  right mid ventral abdominal wall.  Please perform ultrasound-guided aspiration for therapeutic and diagnostic purposes. EXAM: IR ULTRASOUND GUIDANCE COMPARISON:  CT abdomen pelvis - earlier same day MEDICATIONS: The patient is currently admitted to the hospital and receiving intravenous antibiotics. The antibiotics were administered within an appropriate time frame prior to the initiation of the procedure. ANESTHESIA/SEDATION: Moderate (conscious) sedation was employed during this procedure. A total of Versed Fentanyl 100 mcg was administered intravenously. Moderate Sedation Time: 15 minutes. The patient's level of consciousness and vital signs were monitored continuously by radiology nursing throughout the procedure under my direct supervision. CONTRAST:  None COMPLICATIONS: None immediate. PROCEDURE: Informed written consent was obtained from the patient after a discussion of the risks, benefits and alternatives to treatment. Preprocedural ultrasound scanning demonstrated a mixed echogenic fluid collection at the patient's palpable area of concern involving the ventral aspect of the right mid abdomen correlating with the suspected hematoma seen on preceding abdominal CT. A timeout was performed prior to the initiation of the procedure. The skin overlying the palpable abnormality within the lateral aspect of the ventral abdominal wall was prepped and draped in the usual sterile fashion. The overlying soft tissues were anesthetized with 1% lidocaine with epinephrine. Under direct ultrasound guidance, a 18 gauge trocar needle was advanced into the abscess/fluid collection. Ultrasound images were saved for procedural documentation purposes. Next, an Amplatz wire was coiled within the collection. The trocar needle was exchanged for the outer sheath of a 5 [place] Yueh sheath needle. The Yueh sheath was slowly retracted as approximately 20 cc of evolving blood products was aspirated. All aspirated samples were capped and sent to the laboratory for  analysis. Next, an additional at least 20 cc of evolving blood products was evacuated with manual compression. Superficial hemostasis was achieved with manual compression. A dressing was placed. The patient tolerated the procedure well without immediate postprocedural complication. IMPRESSION: Successful US guided of aspiration of approximately 20 mL of evolving blood products from presumed hematoma involving the lateral at ventral aspect of the right mid abdominal wall. Samples were sent to the laboratory as requested by the ordering clinical team. Electronically Signed   By: Sandi Mariscal M.D.   On: 08/04/2016 17:29   Ct Image Guided Drainage By Percutaneous Catheter  Result Date: 08/04/2016 INDICATION: History of colostomy takedown with development of intra-abdominal fluid collections, post initial percutaneous drainage catheter placement on 07/06/2016 with subsequent right posterolateral percutaneous drainage catheter placement performed on 07/17/2016. Patient was seen in interventional radiology drain Clinic on 07/27/2016 with postprocedural CT scan demonstrating complete resolution of the intra-abdominal fluid collections. As such, both percutaneous drainage catheters were removed at that time. Unfortunately, the patient return to the emergency department yesterday with worsening abdominal pain with CT scan demonstrating recurrence of large air in fluid collecting collection within the midline of the left lower abdomen/pelvis. As such, request made for placement of a new percutaneous drainage catheter for infection source control. EXAM: CT IMAGE GUIDED DRAINAGE BY PERCUTANEOUS CATHETER COMPARISON:  CT scan abdomen pelvis - 08/03/2016; 07/27/2016; 07/05/2016; CT-guided percutaneous drainage catheter placement - 07/06/2016; 07/17/2016 MEDICATIONS: The patient is currently admitted to the hospital and receiving intravenous antibiotics. The antibiotics were administered within an appropriate time frame prior to  the initiation of the procedure. ANESTHESIA/SEDATION: Moderate (conscious) sedation was employed during this procedure. A total of Versed 3 mg and Fentanyl 150 mcg was administered intravenously. Moderate Sedation Time: 20 minutes. The patient's level of consciousness and vital signs were monitored continuously by radiology  nursing throughout the procedure under my direct supervision. CONTRAST:  None COMPLICATIONS: None immediate. PROCEDURE: Informed written consent was obtained from the patient after a discussion of the risks, benefits and alternatives to treatment. The patient was placed supine on the CT gantry and a pre procedural CT was performed re-demonstrating the known abscess/fluid collection within the midline of the lower abdomen/ pelvis with dominant air in fluid containing component measuring approximately 3.2 x 8.8 cm (image 46, series 2). The procedure was planned. A timeout was performed prior to the initiation of the procedure. The skin overlying the anterior and lateral aspect of the left lower abdomen/pelvis was prepped and draped in the usual sterile fashion. The overlying soft tissues were anesthetized with 1% lidocaine with epinephrine. Appropriate trajectory was planned with the use of a 22 gauge spinal needle. An 18 gauge trocar needle was advanced into the abscess/fluid collection and a short Amplatz super stiff wire was coiled within the collection. Appropriate positioning was confirmed with a limited CT scan. The tract was serially dilated allowing placement of a 10 [place] all-purpose drainage catheter. Appropriate positioning was confirmed with a limited postprocedural CT scan. Approximately 55 cc of blood tinged, purulent fluid was aspirated. The tube was connected to a drainage bag and sutured in place. A dressing was placed. The patient tolerated the procedure well without immediate post procedural complication. IMPRESSION: Successful CT guided placement of a 10 French all purpose  drain catheter into the recurrent air and fluid collection within the midline of the lower abdomen/pelvis with aspiration of 55 cc of blood tinged purulent fluid. Samples were sent to the laboratory as requested by the ordering clinical team. Electronically Signed   By: Sandi Mariscal M.D.   On: 08/04/2016 17:13    Anti-infectives: Anti-infectives    Start     Dose/Rate Route Frequency Ordered Stop   08/03/16 2200  piperacillin-tazobactam (ZOSYN) IVPB 3.375 g     3.375 g 12.5 mL/hr over 240 Minutes Intravenous Every 8 hours 08/03/16 1858     08/03/16 1400  ampicillin-sulbactam (UNASYN) 1.5 g in sodium chloride 0.9 % 50 mL IVPB  Status:  Discontinued     1.5 g 100 mL/hr over 30 Minutes Intravenous Every 6 hours 08/03/16 1320 08/03/16 1858      Assessment/Plan:  Intra abdominal post op abscess and possible G-tube leak  He is now doing well.  Tube is working well. He remains afebrile.  Cultures so far are negative. Will continue current care. Potential discharge tomorrow with drain. Will plan to leave the drain in for up to a month in case this is an anastomotic issue and will consider a gastrograffin enema prior to drain removal.  LOS: 3 days    Rozelia Catapano A 08/06/2016

## 2016-08-06 NOTE — Consult Note (Signed)
   [facility] CM Inpatient Consult   08/06/2016  Miguel [name] [birth year] [id number]    Spoke with [name] at bedside. He reports he is feeling better. States he anticipates discharging over the weekend.   Discussed that he will receive post hospital discharge call on behalf of [place] [employer] for [employer] employees/dependents with [facility] insurance. He is agreeable and is familiar with his post dc calls from previous hospitalizations.  Expressed appreciation of visit.   [name], MSN-Ed, RN,BSN [facility] Liaison [phone]

## 2016-08-07 MED ORDER — HYDROCODONE-ACETAMINOPHEN 5-325 MG PO TABS: 1.0000 | ORAL_TABLET | ORAL | 0 refills | Status: DC | PRN

## 2016-08-07 MED ORDER — AMOXICILLIN-POT CLAVULANATE 875-125 MG PO TABS
1.0000 | ORAL_TABLET | Freq: Two times a day (BID) | ORAL | Status: DC
  Administered 2016-08-07: 1 via ORAL
  Filled 2016-08-07: qty 1

## 2016-08-07 MED ORDER — HYDROCODONE-ACETAMINOPHEN 5-325 MG PO TABS
1.0000 | ORAL_TABLET | ORAL | Status: DC | PRN
  Administered 2016-08-07: 2 via ORAL
  Filled 2016-08-07: qty 2

## 2016-08-07 MED ORDER — AMOXICILLIN-POT CLAVULANATE 875-125 MG PO TABS: 1.0000 | ORAL_TABLET | Freq: Two times a day (BID) | ORAL | 1 refills | Status: AC

## 2016-08-07 NOTE — Discharge Instructions (Signed)
Discharge instructions   Walk as much as possible Continue your continuous tube feedings the same as you were doing prior to this admission  You will be given a prescription for Norco for pain You will be given a prescription for Augmentin 1 tablet twice a day for 10 days with 1 refill. This is the antibiotic that you will need to clear up the infection Otherwise continue your usual medications  Keep a written record of the daily drainage from the abscess drain Flush the abscess drain catheter with 5-10 mL of sterile saline once daily. The radiology department drain clinic will call you and set up a follow-up appointment, according to their last progress note instructions  Call Dr. Trevor Mace office and make an appointment see Dr. Ninfa Linden in one week  I have written an order for home health nursing.

## 2016-08-07 NOTE — Discharge Summary (Signed)
Patient ID: Miguel [name] [id number] y.o. 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  He is now one-month status post colostomy takedown and repair of a large incisional hernia. His postoperative course was complicated by anastomotic bleeding, G-tube dislodgment,and intra-abdominal abscesses.   Interventional radiology placed percutaneous drains in the abscesses. These resolved on follow-up CAT scan last week and were removed. He has had persistent fevers and generalized malaise.  I needle aspirated a small seroma in his right upper quadrant did not appear infected. Because of his generalized malaise and fevers, Dr. Ninfa Linden decided to admit him to the hospital.  Hospital Course: The patient was admitted and started on IV fluid hydration, nutritional support, and IV antibiotics.  CT imaging showed reaccumulation of a pelvic abscess 9.5 cm with a component within it.  This may be related to  his anastomosis.  Also noted was the gastrostomy tube balloon had pulled back into the abdominal wall.     Interventional radiology evaluated him and placed a percutaneous drain in the pelvic abscess revealing blood-tinged purulent fluid.  Cultures are still negative at this point in time and Gram stain is negative.  Bacterial infection is strongly suspected, however.  His gastrostomy tube was exchanged for a larger 2 by interventional radiology.  He immediately felt better.      He has become afebrile with a normal white count.  He is ambulating independently.  He says his pain is well controlled.  He is asking to go home.  We requested home health nursing reengaged with him to help with wound care, drain care, and gastrostomy tube feeding care.       He is instructed to flush the abscess drain with 5-10 mL of sterile saline once a day.  Interventional radiology state that they  will call him to set up follow-up in the drain clinic        He was given a prescription for Norco for pain and Augmentin 875 mg twice a day for 10 days with 1 refill         He was asked to call and make an appointment to see Dr. Ninfa Linden in one week        In his wife are very comfortable with management of drains and gastrostomy tube feedings, since they have been doing that for several weeks now.  Consults: Interventional radiology  Significant Diagnostic Studies: Blood work, microbiology, and imaging studies  Treatments: Percutaneous image guided drainage of pelvic abscess.  Exchange and upsizing of gastrostomy tube  Disposition: Home  Patient Instructions:  Allergies as of 08/07/2016   No Known Allergies     Medication List    STOP taking these medications   oxyCODONE 5 MG immediate release tablet Commonly known as:  Oxy IR/ROXICODONE     TAKE these medications   acetaminophen 500 MG tablet Commonly known as:  TYLENOL Take 1 tablet (500 mg total) by mouth every 8 (eight) hours as needed for mild pain (for  pain). What changed:  reasons to take this   ALIGN 4 MG Caps Take 4 mg by mouth daily.   amoxicillin-clavulanate 875-125 MG tablet Commonly known as:  AUGMENTIN Take 1 tablet by mouth every 12 (twelve) hours.   aspirin EC 81 MG tablet Take 81 mg by mouth daily after supper.   diclofenac sodium 1 % Gel Commonly known as:  VOLTAREN Apply 1 application topically 2 (two) times daily as needed (back and shoulder pain).   doxycycline 100 MG capsule Commonly known as:  VIBRAMYCIN Take 100 mg by mouth 2 (two) times daily. For 10 days   famotidine 20 MG tablet Commonly known as:  PEPCID Take 1 tablet (20 mg total) by mouth 2 (two) times daily.   feeding supplement (OSMOLITE 1.5 CAL) Liqd Place 1,000 mLs into feeding tube continuous.   ferrous sulfate 325 (65 FE) MG tablet Take 325 mg by mouth daily.   Fish Oil 1200 MG Caps Take 1,200 mg by mouth daily.   HYDROcodone-acetaminophen 5-325 MG tablet Commonly known as:  NORCO/VICODIN Take 1-2 tablets by mouth every 4 (four) hours as needed for moderate pain. What changed:  Another medication with the same name was added. Make sure you understand how and when to take each.   HYDROcodone-acetaminophen 5-325 MG tablet Commonly known as:  NORCO/VICODIN Take 1-2 tablets by mouth every 4 (four) hours as needed for moderate pain. What changed:  You were already taking a medication with the same name, and this prescription was added. Make sure you understand how and when to take each.   multivitamin with minerals Tabs tablet Take 1 tablet by mouth daily.   ondansetron 4 MG tablet Commonly known as:  ZOFRAN Take 4 mg by mouth as needed for nausea or vomiting.   Red Yeast Rice Extract 300 MG Caps Take 300 mg by mouth daily.   sodium chloride flush 0.9 % Soln Commonly known as:  NS 10 mLs by Other route daily.   tamsulosin 0.4 MG Caps capsule Commonly known as:  FLOMAX Take 1 capsule (0.4 mg total) by mouth daily.   traMADol 50  MG tablet Commonly known as:  ULTRAM Take 50 mg by mouth every 6 (six) hours as needed for moderate pain.   zolpidem 10 MG tablet Commonly known as:  AMBIEN Take 5-10 mg by mouth at bedtime as needed for sleep.       Activity: No sports or heavy lifting.  Ambulate as much is possible.  No driving Diet: Continue blenderized diet plus gastrostomy tube feedings Wound Care: as directed  Follow-up:  With Dr. Ninfa Linden in 1 week.  Signed: Edsel Petrin. Dalbert Batman, M.D., FACS General and minimally invasive surgery Breast and Colorectal Surgery    Addendum: I logged on to the [facility] website and reviewed his prescription medication history  08/07/2016, 8:18 AM

## 2016-08-07 NOTE — Progress Notes (Signed)
D/c qwith wife via wheelchair to home. rx and d/c instructions given and explained with understanding.  VSS pain controled dsg changed

## 2016-08-07 NOTE — Care Management Note (Signed)
Case Management Note  Patient Details  Name: Miguel [name] MRN: [id number] Date of Birth: 12-26-1950  Subjective/Objective:                 Referral made to [facility] for [facility] RN. Per AVS "Continue your continuous tube feedings the same as you were doing prior to this admission" therefore will not need additional supplies etc. Patient with order to DC to home. No other CM needs identified at this time.    Action/Plan:  DC to home with [facility] RN, CM requested start of service date to be tomorrow if possible.   Expected Discharge Date:  08/07/16               Expected Discharge Plan:  [place]  In-House Referral:     Discharge planning Services  CM Consult  Post Acute Care Choice:  Home Health Choice offered to:  Patient  DME Arranged:    DME Agency:     HH Arranged:  RN [place] Agency:  [facility]  Status of Service:  Completed, signed off  If discussed at [place] of Stay Meetings, dates discussed:    Additional Comments:  Carles Collet, RN 08/07/2016, 9:40 AM

## 2016-08-08 DIAGNOSIS — Z7982 Long term (current) use of aspirin: Secondary | ICD-10-CM | POA: Diagnosis not present

## 2016-08-08 DIAGNOSIS — Z87442 Personal history of urinary calculi: Secondary | ICD-10-CM | POA: Diagnosis not present

## 2016-08-08 DIAGNOSIS — K5730 Diverticulosis of large intestine without perforation or abscess without bleeding: Secondary | ICD-10-CM | POA: Diagnosis not present

## 2016-08-08 DIAGNOSIS — Z85820 Personal history of malignant melanoma of skin: Secondary | ICD-10-CM | POA: Diagnosis not present

## 2016-08-08 DIAGNOSIS — E440 Moderate protein-calorie malnutrition: Secondary | ICD-10-CM | POA: Diagnosis not present

## 2016-08-08 DIAGNOSIS — D649 Anemia, unspecified: Secondary | ICD-10-CM | POA: Diagnosis not present

## 2016-08-08 DIAGNOSIS — Z431 Encounter for attention to gastrostomy: Secondary | ICD-10-CM | POA: Diagnosis not present

## 2016-08-08 DIAGNOSIS — T814XXA Infection following a procedure, initial encounter: Secondary | ICD-10-CM | POA: Diagnosis not present

## 2016-08-08 DIAGNOSIS — I10 Essential (primary) hypertension: Secondary | ICD-10-CM | POA: Diagnosis not present

## 2016-08-09 ENCOUNTER — Other Ambulatory Visit: Payer: Self-pay | Admitting: Surgery

## 2016-08-09 DIAGNOSIS — K651 Peritoneal abscess: Secondary | ICD-10-CM

## 2016-08-09 LAB — AEROBIC/ANAEROBIC CULTURE W GRAM STAIN (SURGICAL/DEEP WOUND): Culture: NO GROWTH

## 2016-08-09 LAB — AEROBIC/ANAEROBIC CULTURE (SURGICAL/DEEP WOUND)

## 2016-08-10 ENCOUNTER — Encounter (INDEPENDENT_AMBULATORY_CARE_PROVIDER_SITE_OTHER): Payer: Self-pay

## 2016-08-10 ENCOUNTER — Ambulatory Visit (INDEPENDENT_AMBULATORY_CARE_PROVIDER_SITE_OTHER): Payer: 59 | Admitting: Orthopaedic Surgery

## 2016-08-12 DIAGNOSIS — T814XXA Infection following a procedure, initial encounter: Secondary | ICD-10-CM | POA: Diagnosis not present

## 2016-08-12 DIAGNOSIS — K5730 Diverticulosis of large intestine without perforation or abscess without bleeding: Secondary | ICD-10-CM | POA: Diagnosis not present

## 2016-08-12 DIAGNOSIS — Z87442 Personal history of urinary calculi: Secondary | ICD-10-CM | POA: Diagnosis not present

## 2016-08-12 DIAGNOSIS — Z85820 Personal history of malignant melanoma of skin: Secondary | ICD-10-CM | POA: Diagnosis not present

## 2016-08-12 DIAGNOSIS — I10 Essential (primary) hypertension: Secondary | ICD-10-CM | POA: Diagnosis not present

## 2016-08-12 DIAGNOSIS — E440 Moderate protein-calorie malnutrition: Secondary | ICD-10-CM | POA: Diagnosis not present

## 2016-08-12 DIAGNOSIS — D649 Anemia, unspecified: Secondary | ICD-10-CM | POA: Diagnosis not present

## 2016-08-12 DIAGNOSIS — Z431 Encounter for attention to gastrostomy: Secondary | ICD-10-CM | POA: Diagnosis not present

## 2016-08-12 DIAGNOSIS — Z7982 Long term (current) use of aspirin: Secondary | ICD-10-CM | POA: Diagnosis not present

## 2016-08-13 LAB — AEROBIC/ANAEROBIC CULTURE W GRAM STAIN (SURGICAL/DEEP WOUND)
Special Requests: NORMAL
Special Requests: NORMAL

## 2016-08-13 LAB — AEROBIC/ANAEROBIC CULTURE (SURGICAL/DEEP WOUND)

## 2016-08-17 ENCOUNTER — Ambulatory Visit
Admission: RE | Admit: 2016-08-17 | Discharge: 2016-08-17 | Disposition: A | Discharge status: Home / Self Care | Payer: 59 | Source: Ambulatory Visit | Attending: Surgery | Admitting: Surgery

## 2016-08-17 ENCOUNTER — Ambulatory Visit
Admission: RE | Admit: 2016-08-17 | Discharge: 2016-08-17 | Disposition: A | Discharge status: Home / Self Care | Payer: 59 | Source: Ambulatory Visit | Attending: Radiology | Admitting: Radiology

## 2016-08-17 ENCOUNTER — Ambulatory Visit (HOSPITAL_COMMUNITY)
Admission: RE | Admit: 2016-08-17 | Discharge: 2016-08-17 | Disposition: A | Discharge status: Home / Self Care | Payer: 59 | Source: Ambulatory Visit | Attending: Surgery | Admitting: Surgery

## 2016-08-17 ENCOUNTER — Other Ambulatory Visit: Payer: Self-pay | Admitting: *Deleted

## 2016-08-17 ENCOUNTER — Other Ambulatory Visit (HOSPITAL_COMMUNITY): Payer: Self-pay | Admitting: Surgery

## 2016-08-17 ENCOUNTER — Other Ambulatory Visit: Payer: Self-pay | Admitting: Surgery

## 2016-08-17 ENCOUNTER — Ambulatory Visit (HOSPITAL_COMMUNITY): Payer: 59

## 2016-08-17 ENCOUNTER — Other Ambulatory Visit: Payer: 59

## 2016-08-17 DIAGNOSIS — K651 Peritoneal abscess: Secondary | ICD-10-CM

## 2016-08-17 DIAGNOSIS — N200 Calculus of kidney: Secondary | ICD-10-CM | POA: Diagnosis not present

## 2016-08-17 DIAGNOSIS — R109 Unspecified abdominal pain: Secondary | ICD-10-CM | POA: Diagnosis not present

## 2016-08-17 HISTORY — PX: IR RADIOLOGIST EVAL & MGMT: IMG5224

## 2016-08-17 MED ORDER — IOPAMIDOL (ISOVUE-300) INJECTION 61%: 100.0000 mL | Freq: Once | INTRAVENOUS | Status: DC | PRN

## 2016-08-17 NOTE — Progress Notes (Signed)
Patient ID: Miguel [name], male   DOB: Oct 03, 1950, 66 y.o.   MRN: [id number]   Referring Physician(s): [name]  Chief Complaint: The patient is seen in follow up today s/p percutaneous drain placement on 08-04-16 for intra-abdominal abscess  History of present illness:  This is a patient well-known to IR service for multiple abdominal drain placements.  He has had 3 prior drains which were removed on 07-27-16 after a CT scan that showed resolution.  He returned to the hospital on 08-03-16 secondary to abdominal pain.  He had a CT scan which revealed his g-tube balloon was malpositioned, but also that he had a new intra-abdominal fluid collection.  His g-tube was repositioned, which actually resolved a significant portion of his pain, and a new drain was placed in this fluid collection.  He returns today after a repeat CT scan and for a drain injection.  He saw Dr. Ninfa Linden yesterday, who felt his drain likely needed to remain in placed for another couple of weeks.  He completes is course of Cipro tomorrow and denies any fevers or abdominal pain.  He is flushing his drain twice a day currently and has minimal clear output.  Past Medical History:  Diagnosis Date  . Anemia   . Anxiety   . BPH (benign prostatic hyperplasia)   . Gastritis   . GERD (gastroesophageal reflux disease)    "seldom" (07/16/2016)  . GI bleed due to NSAIDs 10/27/2015  . History of blood transfusion 06/2016   post OR/notes 07/15/2016  . History of hiatal hernia   . History of kidney stones   . Hyperlipidemia   . Hypertension   . Melanoma of back ([place])    "mid back"  . Sigmoid diverticulitis    with perforation    Past Surgical History:  Procedure Laterality Date  . COLON SURGERY     sigmoid  . COLOSTOMY TAKEDOWN N/A 06/28/2016   Procedure: COLOSTOMY TAKEDOWN;  Surgeon: [name], MD;  Location: [place];  Service: General;  Laterality: N/A;  . ESOPHAGOGASTRODUODENOSCOPY N/A 10/27/2015   Procedure:  ESOPHAGOGASTRODUODENOSCOPY (EGD);  Surgeon: Clarene Essex, MD;  Location: [facility] ENDOSCOPY;  Service: Endoscopy;  Laterality: N/A;  . ESOPHAGOGASTRODUODENOSCOPY N/A 10/29/2015   Procedure: ESOPHAGOGASTRODUODENOSCOPY (EGD);  Surgeon: Ronald Lobo, MD;  Location: [facility] ENDOSCOPY;  Service: Endoscopy;  Laterality: N/A;  . ESOPHAGOGASTRODUODENOSCOPY N/A 10/30/2015   Procedure: ESOPHAGOGASTRODUODENOSCOPY (EGD);  Surgeon: Clarene Essex, MD;  Location: [facility] ENDOSCOPY;  Service: Endoscopy;  Laterality: N/A;  . GASTROSTOMY TUBE PLACEMENT  11/21/2015   REDUCTION OF HIATAL HERNIA , REPAIR HIATAL HERNIA, RESECTION SMALL BOWEL WITH ANASTOMOSIS, PLACEMENT GASTROSTOMY TUBE, PLACEMENT DUODENOSTOMY TUBE (N/A)  . HEMORRHOID BANDING  X 2  . HERNIA REPAIR    . HIATAL HERNIA REPAIR N/A 11/21/2015   Procedure: REDUCTION OF HIATAL HERNIA , REPAIR HIATAL HERNIA, RESECTION SMALL BOWEL WITH ANASTOMOSIS, PLACEMENT GASTROSTOMY TUBE, PLACEMENT DUODENOSTOMY TUBE;  Surgeon: Mickeal Skinner, MD;  Location: [place];  Service: General;  Laterality: N/A;  . [place]  06/28/2016   open/notes 07/15/2016  . IR GUIDED DRAIN W CATHETER PLACEMENT  07/06/2016   /NOTES 07/15/2016  . IR [place] TUBE PERCUT W/FLUORO  08/04/2016  . IR US GUIDE BX ASP/DRAIN  08/04/2016  . KNEE CARTILAGE SURGERY Right 1971   "opened me up"  . LAPAROTOMY N/A 07/05/2015   Procedure: PARTIAL SIGMOID COLECTOMY AND COLOSTOMY;  Surgeon: [name], MD;  Location: [place];  Service: General;  Laterality: N/A;  . MELANOMA EXCISION  2001  . 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 REPAIR OF PERFORATED ULCER N/A 10/30/2015   Procedure: REPAIR OF BLEEDING  ULCER;  Surgeon: Judeth Horn, MD;  Location: [place];  Service: General;  Laterality: N/A;  . TUMOR EXCISION  2009   "back; fatty tumor"    Allergies: Patient has no known  allergies.  Medications: Prior to Admission medications   Medication Sig Start Date End Date Taking? Authorizing Provider  acetaminophen (TYLENOL) 500 MG tablet Take 1 tablet (500 mg total) by mouth every 8 (eight) hours as needed for mild pain (for pain). Patient taking differently: Take 500 mg by mouth every 8 (eight) hours as needed for mild pain or fever.  12/05/15   Jill Alexanders, PA-C  amoxicillin-clavulanate (AUGMENTIN) 875-125 MG tablet Take 1 tablet by mouth every 12 (twelve) hours. 08/07/16 08/17/16  Fanny Skates, MD  aspirin EC 81 MG tablet Take 81 mg by mouth daily after supper.     [provider]  diclofenac sodium (VOLTAREN) 1 % GEL Apply 1 application topically 2 (two) times daily as needed (back and shoulder pain).    [provider]  doxycycline (VIBRAMYCIN) 100 MG capsule Take 100 mg by mouth 2 (two) times daily. For 10 days 08/01/16   [provider]  famotidine (PEPCID) 20 MG tablet Take 1 tablet (20 mg total) by mouth 2 (two) times daily. 11/09/15   Elwin Mocha, MD  ferrous sulfate 325 (65 FE) MG tablet Take 325 mg by mouth daily.    [provider]  HYDROcodone-acetaminophen (NORCO/VICODIN) 5-325 MG tablet Take 1-2 tablets by mouth every 4 (four) hours as needed for moderate pain. Patient not taking: Reported on 08/03/2016 07/13/16   [name], MD  HYDROcodone-acetaminophen (NORCO/VICODIN) 5-325 MG tablet Take 1-2 tablets by mouth every 4 (four) hours as needed for moderate pain. 08/07/16   Fanny Skates, MD  Multiple Vitamin (MULTIVITAMIN WITH MINERALS) TABS tablet Take 1 tablet by mouth daily.    [provider]  Nutritional Supplements (FEEDING SUPPLEMENT, OSMOLITE 1.5 CAL,) LIQD Place 1,000 mLs into feeding tube continuous.    [provider]  Omega-3 Fatty Acids (FISH OIL) 1200 MG CAPS Take 1,200 mg by mouth daily.     [provider]  ondansetron (ZOFRAN) 4 MG tablet Take 4 mg by mouth as  needed for nausea or vomiting.  07/23/16   [provider]  Probiotic Product (ALIGN) 4 MG CAPS Take 4 mg by mouth daily.     [provider]  Red Yeast Rice Extract 300 MG CAPS Take 300 mg by mouth daily.    [provider]  sodium chloride flush (NS) 0.9 % SOLN 10 mLs by Other route daily. Patient not taking: Reported on 08/03/2016 07/21/16   Jill Alexanders, PA-C  tamsulosin (FLOMAX) 0.4 MG CAPS capsule Take 1 capsule (0.4 mg total) by mouth daily. 01/04/16   Fanny Skates, MD  traMADol (ULTRAM) 50 MG tablet Take 50 mg by mouth every 6 (six) hours as needed for moderate pain.  05/21/16   [provider]  zolpidem (AMBIEN) 10 MG tablet Take 5-10 mg by mouth at bedtime as needed for sleep.     [provider]     Family History  Problem Relation Age of Onset  . Stroke Mother   . Stroke Brother   .  Heart disease Brother     Social History   Social History  . Marital status: Married    Spouse name: N/A  . Number of children: N/A  . Years of education: N/A   Occupational History  . unable to work since April    Social History Main Topics  . Smoking status: Never Smoker  . Smokeless tobacco: Former Systems developer    Types: Snuff    Quit date: 07/13/2016  . Alcohol use No  . Drug use: No  . Sexual activity: Yes   Other Topics Concern  . Not on file   Social History Narrative  . No narrative on file     Vital Signs: BP 115/80 (BP Location: Right Arm)   Pulse 92   Temp 97.7 F (36.5 C) (Oral)   SpO2 97%   Physical Exam  Abd: soft, NT, g-tube in position and clamped off.  Drain in place with minimal clear drainage in his bag.  His drain site is c/d/i.  His drain injection reveals a tract, but it is unclear what it leads to.  His drain is left in place.  Imaging: No results found.  Labs:  CBC:  Recent Labs  07/18/16 1156 07/19/16 0343 08/03/16 1221 08/05/16 0350  WBC 13.0* 13.0* 10.5 5.6  HGB 9.7* 9.5* 9.5* 8.3*  HCT  30.2* 29.6* 30.0* 26.9*  PLT 665* 649* 442* 439*    COAGS:  Recent Labs  10/29/15 1851 10/30/15 1245 10/30/15 1636 11/06/15 0453 06/30/16 1605 07/06/16 1053 08/04/16 1016  INR 1.28 1.42 1.34 1.38 1.29 1.15 1.28  APTT 29 27 25   --  32  --   --     BMP:  Recent Labs  07/15/16 1853 07/18/16 1156 07/19/16 0343 08/03/16 1221  NA 131* 128* 131* 132*  K 4.1 4.0 3.9 4.4  CL 96* 95* 97* 94*  CO2 24 27 27 28   GLUCOSE 101* 123* 118* 129*  BUN 9 10 11 15   CALCIUM 7.8* 7.4* 7.6* 8.3*  CREATININE 0.75 0.80 0.79 0.75  GFRNONAA >60 >60 >60 >60  GFRAA >60 >60 >60 >60    LIVER FUNCTION TESTS:  Recent Labs  12/25/15 1736 12/26/15 0636 07/15/16 1853 08/03/16 1221  BILITOT 0.5 0.8 0.5 0.5  AST 26 17 15 18   ALT 21 16* 19 18  ALKPHOS 108 87 76 96  PROT 6.6 5.4* 5.2* 6.5  ALBUMIN 2.6* 2.1* 1.7* 2.2*    Assessment:  1. Intra-abdominal abscess, s/p perc drain on 08-04-16  Dr. Pascal Lux has reviewed the patient's imaging with me.  His CT scan shows resolution of this fluid collection; however, his drain injection reveals a fistulous tract.  It is unclear if the tract is connecting to something such as bowel or just serpiginous and not connecting to anything in particular in the pelvis.  Given his history, Dr. Trevor Mace thoughts yesterday, and the fluoro imaging, we will leave his drain in place for now.  He will stop flushing his drain.  He will continue to record his output and bring the information back with him.  We will have him return to drain clinic next Thursday for a drain injection only to evaluate this area.  We will then have more information hopefully at that time before he sees Dr. Ninfa Linden in follow up the following Tuesday, 08-31-16.  The patient understood this plan.  Signed: Henreitta Cea, PA-C 08/17/2016, 1:53 PM   Please refer to Dr. Pascal Lux' attestation of this note for management and plan.

## 2016-08-17 NOTE — Patient Outreach (Addendum)
[place] [facility]) Care Management  08/17/2016  Miguel [name] 09-07-1950 [id number]   Subjective: Telephone call to patient's wife mobile number, per patient's previous verbal consent, no answer, left HIPAA compliant voicemail message, and requested call back.    Objective: Per chart review: Patient hospitalized  08/03/16 - 08/07/16 for Intra-abdominal abscess and postop fever.  Patient hospitalized 07/15/16 -07/21/16 for Intra-abdominal abscess, Cellulitis, Malnutrition of moderate degree, and wound infection. Patient hospitalized 06/28/16 -07/13/16 for colostomy takedown. Status post COLOSTOMY TAKEDOWN, SEGMENTAL COLECTOMY, and INCISIONAL HERNIA REPAIR on 06/28/16. Patient hospitalized 12/25/15 - 01/03/16 for fever, difficulty urinating, and abdominal pain. Patient hospitalized 12/12/15 - 9/28/17for dehydration and malnutrition. Patient hospitalized 11/13/15 -12/05/15 with Postoperative fever, acute kidney injury, severe protein malnutrition, and sepsis. Status post Open reduction of hiatal hernia repair of hiatal hernia, resection of small bowel with anastomosis,placement of gastrostomy tube, placement of duodenostomy tube on 11/21/15.  Patient hospitalized 10/27/15 - 11/09/15 for GI bleed, hiatal hernia, acute blood loss anemia, and gastric ulcer. Patient has a history of diverticulosis with perforation, hyperlipidemia, and chronic kidney disease. Patient status post REPAIR OF BLEEDING ULCER PARTIAL DISTAL GASTRECTOMY WITH BILROTH II RECONSTRUCTION/GASTROJEJUNOSTOMY on 10/30/15.     Assessment: Received verbal UMR Transition of care referral from [facility] Liaison [name])  on 08/04/16. Transition of care follow up pending patient contact.    Plan:RNCM will call patient for 2nd telephone outreach attempt, transition of care follow up, within 10 business days if no return call.    Tayden Duran H. Annia Friendly, BSN, [place] Management [facility]  Telephonic CM Phone: [phone] Fax: [phone]

## 2016-08-18 ENCOUNTER — Other Ambulatory Visit: Payer: Self-pay | Admitting: *Deleted

## 2016-08-18 DIAGNOSIS — Z85820 Personal history of malignant melanoma of skin: Secondary | ICD-10-CM | POA: Diagnosis not present

## 2016-08-18 DIAGNOSIS — K5730 Diverticulosis of large intestine without perforation or abscess without bleeding: Secondary | ICD-10-CM | POA: Diagnosis not present

## 2016-08-18 DIAGNOSIS — D649 Anemia, unspecified: Secondary | ICD-10-CM | POA: Diagnosis not present

## 2016-08-18 DIAGNOSIS — Z7982 Long term (current) use of aspirin: Secondary | ICD-10-CM | POA: Diagnosis not present

## 2016-08-18 DIAGNOSIS — Z87442 Personal history of urinary calculi: Secondary | ICD-10-CM | POA: Diagnosis not present

## 2016-08-18 DIAGNOSIS — Z431 Encounter for attention to gastrostomy: Secondary | ICD-10-CM | POA: Diagnosis not present

## 2016-08-18 DIAGNOSIS — E440 Moderate protein-calorie malnutrition: Secondary | ICD-10-CM | POA: Diagnosis not present

## 2016-08-18 DIAGNOSIS — I10 Essential (primary) hypertension: Secondary | ICD-10-CM | POA: Diagnosis not present

## 2016-08-18 DIAGNOSIS — T814XXA Infection following a procedure, initial encounter: Secondary | ICD-10-CM | POA: Diagnosis not present

## 2016-08-18 NOTE — Patient Outreach (Signed)
[facility]) Care Management  08/18/2016  Miguel [name] 05/27/1950 [id number]   Subjective: Telephone call to patient's wife mobile number, per patient's previous verbal consent, no answer, left HIPAA compliant voicemail message, and requested call back.    Objective: Per chart review: Patient hospitalized  08/03/16 - 08/07/16 for Intra-abdominal abscess and postop fever.  Patient hospitalized 07/15/16 -07/21/16 for Intra-abdominal abscess, Cellulitis, Malnutrition of moderate degree, and wound infection. Patient hospitalized 06/28/16 -07/13/16 for colostomy takedown. Status post COLOSTOMY TAKEDOWN, SEGMENTAL COLECTOMY, and INCISIONAL HERNIA REPAIR on 06/28/16. Patient hospitalized 12/25/15 - 01/03/16 for fever, difficulty urinating, and abdominal pain. Patient hospitalized 12/12/15 - 9/28/17for dehydration and malnutrition. Patient hospitalized 11/13/15 -12/05/15 with Postoperative fever, acute kidney injury, severe protein malnutrition, and sepsis. Status post Open reduction of hiatal hernia repair of hiatal hernia, resection of small bowel with anastomosis,placement of gastrostomy tube, placement of duodenostomy tube on 11/21/15.  Patient hospitalized 10/27/15 - 11/09/15 for GI bleed, hiatal hernia, acute blood loss anemia, and gastric ulcer. Patient has a history of diverticulosis with perforation, hyperlipidemia, and chronic kidney disease. Patient status post REPAIR OF BLEEDING ULCER PARTIAL DISTAL GASTRECTOMY WITH BILROTH II RECONSTRUCTION/GASTROJEJUNOSTOMY on 10/30/15.     Assessment: Received verbal UMR Transition of care referral from [facility] Liaison Miguel [name])  on 08/04/16. Transition of care follow up pending patient contact.    Plan:RNCM will call patient for 3rd telephone outreach attempt, transition of care follow up, within 10 business days if no return call.    Miguel [name], BSN, [place] Management [facility]  Telephonic CM Phone: [phone] Fax: [phone]

## 2016-08-19 ENCOUNTER — Ambulatory Visit: Payer: Self-pay | Admitting: *Deleted

## 2016-08-20 ENCOUNTER — Encounter: Payer: Self-pay | Admitting: *Deleted

## 2016-08-20 ENCOUNTER — Other Ambulatory Visit: Payer: Self-pay | Admitting: *Deleted

## 2016-08-20 DIAGNOSIS — I10 Essential (primary) hypertension: Secondary | ICD-10-CM | POA: Diagnosis not present

## 2016-08-20 DIAGNOSIS — Z431 Encounter for attention to gastrostomy: Secondary | ICD-10-CM | POA: Diagnosis not present

## 2016-08-20 DIAGNOSIS — K5730 Diverticulosis of large intestine without perforation or abscess without bleeding: Secondary | ICD-10-CM | POA: Diagnosis not present

## 2016-08-20 DIAGNOSIS — Z87442 Personal history of urinary calculi: Secondary | ICD-10-CM | POA: Diagnosis not present

## 2016-08-20 DIAGNOSIS — D649 Anemia, unspecified: Secondary | ICD-10-CM | POA: Diagnosis not present

## 2016-08-20 DIAGNOSIS — E440 Moderate protein-calorie malnutrition: Secondary | ICD-10-CM | POA: Diagnosis not present

## 2016-08-20 DIAGNOSIS — T814XXA Infection following a procedure, initial encounter: Secondary | ICD-10-CM | POA: Diagnosis not present

## 2016-08-20 DIAGNOSIS — Z7982 Long term (current) use of aspirin: Secondary | ICD-10-CM | POA: Diagnosis not present

## 2016-08-20 DIAGNOSIS — Z85820 Personal history of malignant melanoma of skin: Secondary | ICD-10-CM | POA: Diagnosis not present

## 2016-08-20 NOTE — Patient Outreach (Addendum)
[place] [facility]) [name] Management  08/20/2016  Miguel [name] [birth year] [id number]   Subjective: Telephone call from patient's wife, states she is returning call, and remembers speaking with RNCM in the past.  Discussed [facility] [name] Management UMR Transition of [name] follow up, patient wife, voiced understanding, and is in agreement to complete follow up on patient's behalf.  Wife states patient is doing much better, has completed antibiotic treatment,  CT Scan completed earlier this week, will have repeat next week to verify if something is wrong (fistula per chart review), drain tube still in place, no longer flushing drain tube per MD, saw surgeon on 08/16/16 for follow up appointment, appointment went well, has had no temperature, still taking small amounts of liquid by mouth, continued feedings at night, and extra water through feeding tube as needed.   Wife states patient does not have any transition of [name], [name] coordination, disease management, disease monitoring, transportation, community resource, or pharmacy needs at this time. States she is very appreciative of the follow up and is in agreement to receive [place] [facility] information on patient's behalf.  Objective: Per chart review: Patient hospitalized 08/03/16 - 08/07/16 for Intra-abdominal abscess and postop fever. Patient hospitalized 07/15/16 -07/21/16 for Intra-abdominal abscess, Cellulitis, Malnutrition of moderate degree, and wound infection. Patient hospitalized 06/28/16 -07/13/16 for colostomy takedown. Status post COLOSTOMY TAKEDOWN, SEGMENTAL COLECTOMY, and INCISIONAL HERNIA REPAIR on 06/28/16. Patient hospitalized 12/25/15 - 01/03/16 for fever, difficulty urinating, and abdominal pain. Patient hospitalized 12/12/15 - 9/28/17for dehydration and malnutrition. Patient hospitalized 11/13/15 -12/05/15 with Postoperative fever, acute kidney injury, severe protein malnutrition, and sepsis. Status post Open reduction of hiatal  hernia repair of hiatal hernia, resection of small bowel with anastomosis,placement of gastrostomy tube, placement of duodenostomy tube on 11/21/15.  Patient hospitalized 10/27/15 - 11/09/15 for GI bleed, hiatal hernia, acute blood loss anemia, and gastric ulcer. Patient has a history of diverticulosis with perforation, hyperlipidemia, and chronic kidney disease. Patient status post REPAIR OF BLEEDING ULCER PARTIAL DISTAL GASTRECTOMY WITH BILROTH II RECONSTRUCTION/GASTROJEJUNOSTOMY on 10/30/15.     Assessment: Received verbal UMR Transition of [name] referral from [facility] Liaison Miguel [name]) on 08/04/16. Transition of [name] follow up completed, no [name] management needs, and will proceed with case closure.   Plan: RNCM will send patient successful outreach letter, [facility] pamphlet, and magnet. RNCM will send case closure due to follow up completed / no [name] management needs request to Miguel [name] at [place] [facility].    Miguel [name] H. Annia Friendly, BSN, [place] Management [facility] Telephonic CM Phone: [phone] Fax: [phone]

## 2016-08-20 NOTE — Patient Outreach (Addendum)
[facility]) Care Management  08/20/2016  Miguel [name] 02-10-1951 [id number]   Subjective: Telephone call to patient's wife mobile number, per patient's previous verbal consent, no answer, left HIPAA compliant voicemail message, and requested call back.  Telephone call to patient's home number, no answer, left HIPAA compliant voicemail message, and requested call back.   Objective: Per chart review: Patient hospitalized 08/03/16 - 08/07/16 for Intra-abdominal abscess and postop fever. Patient hospitalized 07/15/16 -07/21/16 for Intra-abdominal abscess, Cellulitis, Malnutrition of moderate degree, and wound infection. Patient hospitalized 06/28/16 -07/13/16 for colostomy takedown. Status post COLOSTOMY TAKEDOWN, SEGMENTAL COLECTOMY, and INCISIONAL HERNIA REPAIR on 06/28/16. Patient hospitalized 12/25/15 - 01/03/16 for fever, difficulty urinating, and abdominal pain. Patient hospitalized 12/12/15 - 9/28/17for dehydration and malnutrition. Patient hospitalized 11/13/15 -12/05/15 with Postoperative fever, acute kidney injury, severe protein malnutrition, and sepsis. Status post Open reduction of hiatal hernia repair of hiatal hernia, resection of small bowel with anastomosis,placement of gastrostomy tube, placement of duodenostomy tube on 11/21/15.  Patient hospitalized 10/27/15 - 11/09/15 for GI bleed, hiatal hernia, acute blood loss anemia, and gastric ulcer. Patient has a history of diverticulosis with perforation, hyperlipidemia, and chronic kidney disease. Patient status post REPAIR OF BLEEDING ULCER PARTIAL DISTAL GASTRECTOMY WITH BILROTH II RECONSTRUCTION/GASTROJEJUNOSTOMY on 10/30/15.     Assessment: Received verbal UMR Transition of care referral from [facility] Liaison [name]) on 08/04/16. Transition of care follow up pending patient contact.    Plan: RNCM will send unsuccessful outreach  letter, [facility] pamphlet, and proceed with case closure,  within 10 business days if no return call.    Hanifah Royse H. Annia Friendly, BSN, [place] Management [facility] Telephonic CM Phone: [phone] Fax: [phone]

## 2016-08-23 DIAGNOSIS — Z85820 Personal history of malignant melanoma of skin: Secondary | ICD-10-CM | POA: Diagnosis not present

## 2016-08-23 DIAGNOSIS — D649 Anemia, unspecified: Secondary | ICD-10-CM | POA: Diagnosis not present

## 2016-08-23 DIAGNOSIS — Z7982 Long term (current) use of aspirin: Secondary | ICD-10-CM | POA: Diagnosis not present

## 2016-08-23 DIAGNOSIS — T814XXA Infection following a procedure, initial encounter: Secondary | ICD-10-CM | POA: Diagnosis not present

## 2016-08-23 DIAGNOSIS — K5730 Diverticulosis of large intestine without perforation or abscess without bleeding: Secondary | ICD-10-CM | POA: Diagnosis not present

## 2016-08-23 DIAGNOSIS — Z87442 Personal history of urinary calculi: Secondary | ICD-10-CM | POA: Diagnosis not present

## 2016-08-23 DIAGNOSIS — E440 Moderate protein-calorie malnutrition: Secondary | ICD-10-CM | POA: Diagnosis not present

## 2016-08-23 DIAGNOSIS — Z431 Encounter for attention to gastrostomy: Secondary | ICD-10-CM | POA: Diagnosis not present

## 2016-08-23 DIAGNOSIS — I10 Essential (primary) hypertension: Secondary | ICD-10-CM | POA: Diagnosis not present

## 2016-08-24 MED FILL — TAMSULOSIN HCL 0.4 MG CAP: 0.4 | 30 days supply | Qty: 30 | Fill #3

## 2016-08-26 ENCOUNTER — Ambulatory Visit
Admission: RE | Admit: 2016-08-26 | Discharge: 2016-08-26 | Disposition: A | Discharge status: Home / Self Care | Payer: 59 | Source: Ambulatory Visit | Attending: Surgery | Admitting: Surgery

## 2016-08-26 DIAGNOSIS — K651 Peritoneal abscess: Secondary | ICD-10-CM

## 2016-08-26 HISTORY — PX: IR RADIOLOGIST EVAL & MGMT: IMG5224

## 2016-08-26 NOTE — Progress Notes (Signed)
Patient ID: Miguel [name], male   DOB: [birth year], 66 y.o.   MRN: [id number]   Referring Physician(s): [name]  Chief Complaint: The patient is seen in follow up today s/p percutaneous drain placement on 08-04-16 for intra-abdominal abscess  History of present illness:  This is a patient well-known to IR service for multiple abdominal drain placements.  He has had 3 prior drains which were removed on 07-27-16 after a CT scan that showed resolution.  He returned to the hospital on 08-03-16 secondary to abdominal pain.  He had a CT scan which revealed his g-tube balloon was malpositioned, but also that he had a new intra-abdominal fluid collection.  His g-tube was repositioned, which actually resolved a significant portion of his pain, and a new drain was placed in this fluid collection.  He returns today for a repeat drain injection.  His last injection 5/22 was suspicious for fistulous connection. He reports minimal output from drain.  Past Medical History:  Diagnosis Date  . Anemia   . Anxiety   . BPH (benign prostatic hyperplasia)   . Gastritis   . GERD (gastroesophageal reflux disease)    "seldom" (07/16/2016)  . GI bleed due to NSAIDs 10/27/2015  . History of blood transfusion 06/2016   post OR/notes 07/15/2016  . History of hiatal hernia   . History of kidney stones   . Hyperlipidemia   . Hypertension   . Melanoma of back ([place])    "mid back"  . Sigmoid diverticulitis    with perforation    Past Surgical History:  Procedure Laterality Date  . COLON SURGERY     sigmoid  . COLOSTOMY TAKEDOWN N/A 06/28/2016   Procedure: COLOSTOMY TAKEDOWN;  Surgeon: [name], MD;  Location: [place];  Service: General;  Laterality: N/A;  . ESOPHAGOGASTRODUODENOSCOPY N/A 10/27/2015   Procedure: ESOPHAGOGASTRODUODENOSCOPY (EGD);  Surgeon: Clarene Essex, MD;  Location: [facility] ENDOSCOPY;  Service: Endoscopy;  Laterality: N/A;  . ESOPHAGOGASTRODUODENOSCOPY N/A 10/29/2015   Procedure:  ESOPHAGOGASTRODUODENOSCOPY (EGD);  Surgeon: Ronald Lobo, MD;  Location[facility] ENDOSCOPY;  Service: Endoscopy;  Laterality: N/A;  . ESOPHAGOGASTRODUODENOSCOPY N/A 10/30/2015   Procedure: ESOPHAGOGASTRODUODENOSCOPY (EGD);  Surgeon: Clarene Essex, MD;  Location: [facility] ENDOSCOPY;  Service: Endoscopy;  Laterality: N/A;  . GASTROSTOMY TUBE PLACEMENT  11/21/2015   REDUCTION OF HIATAL HERNIA , REPAIR HIATAL HERNIA, RESECTION SMALL BOWEL WITH ANASTOMOSIS, PLACEMENT GASTROSTOMY TUBE, PLACEMENT DUODENOSTOMY TUBE (N/A)  . HEMORRHOID BANDING  X 2  . HERNIA REPAIR    . HIATAL HERNIA REPAIR N/A 11/21/2015   Procedure: REDUCTION OF HIATAL HERNIA , REPAIR HIATAL HERNIA, RESECTION SMALL BOWEL WITH ANASTOMOSIS, PLACEMENT GASTROSTOMY TUBE, PLACEMENT DUODENOSTOMY TUBE;  Surgeon: Mickeal Skinner, MD;  Location: [place];  Service: General;  Laterality: N/A;  . [place]  06/28/2016   open/notes 07/15/2016  . IR GUIDED DRAIN W CATHETER PLACEMENT  07/06/2016   /NOTES 07/15/2016  . IR [place] TUBE PERCUT W/FLUORO  08/04/2016  . IR US GUIDE BX ASP/DRAIN  08/04/2016  . KNEE CARTILAGE SURGERY Right 1971   "opened me up"  . LAPAROTOMY N/A 07/05/2015   Procedure: PARTIAL SIGMOID COLECTOMY AND COLOSTOMY;  Surgeon: [name], MD;  Location: [place];  Service: General;  Laterality: N/A;  . MELANOMA EXCISION  2001  . 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 REPAIR OF PERFORATED ULCER N/A 10/30/2015  Procedure: REPAIR OF BLEEDING  ULCER;  Surgeon: Judeth Horn, MD;  Location: [place];  Service: General;  Laterality: N/A;  . TUMOR EXCISION  2009   "back; fatty tumor"    Allergies: Patient has no known allergies.  Medications: Prior to Admission medications   Medication Sig Start Date End Date Taking? Authorizing Provider  acetaminophen (TYLENOL) 500 MG tablet Take 1 tablet  (500 mg total) by mouth every 8 (eight) hours as needed for mild pain (for pain). Patient taking differently: Take 500 mg by mouth every 8 (eight) hours as needed for mild pain or fever.  12/05/15   Jill Alexanders, PA-C  amoxicillin-clavulanate (AUGMENTIN) 875-125 MG tablet Take 1 tablet by mouth every 12 (twelve) hours. 08/07/16 08/17/16  Fanny Skates, MD  aspirin EC 81 MG tablet Take 81 mg by mouth daily after supper.     [provider]  diclofenac sodium (VOLTAREN) 1 % GEL Apply 1 application topically 2 (two) times daily as needed (back and shoulder pain).    [provider]  doxycycline (VIBRAMYCIN) 100 MG capsule Take 100 mg by mouth 2 (two) times daily. For 10 days 08/01/16   [provider]  famotidine (PEPCID) 20 MG tablet Take 1 tablet (20 mg total) by mouth 2 (two) times daily. 11/09/15   Elwin Mocha, MD  ferrous sulfate 325 (65 FE) MG tablet Take 325 mg by mouth daily.    [provider]  HYDROcodone-acetaminophen (NORCO/VICODIN) 5-325 MG tablet Take 1-2 tablets by mouth every 4 (four) hours as needed for moderate pain. Patient not taking: Reported on 08/03/2016 07/13/16   [name], MD  HYDROcodone-acetaminophen (NORCO/VICODIN) 5-325 MG tablet Take 1-2 tablets by mouth every 4 (four) hours as needed for moderate pain. 08/07/16   Fanny Skates, MD  Multiple Vitamin (MULTIVITAMIN WITH MINERALS) TABS tablet Take 1 tablet by mouth daily.    [provider]  Nutritional Supplements (FEEDING SUPPLEMENT, OSMOLITE 1.5 CAL,) LIQD Place 1,000 mLs into feeding tube continuous.    [provider]  Omega-3 Fatty Acids (FISH OIL) 1200 MG CAPS Take 1,200 mg by mouth daily.     [provider]  ondansetron (ZOFRAN) 4 MG tablet Take 4 mg by mouth as needed for nausea or vomiting.  07/23/16   [provider]  Probiotic Product (ALIGN) 4 MG CAPS Take 4 mg by mouth daily.     [provider]  Red Yeast Rice Extract  300 MG CAPS Take 300 mg by mouth daily.    [provider]  sodium chloride flush (NS) 0.9 % SOLN 10 mLs by Other route daily. Patient not taking: Reported on 08/03/2016 07/21/16   Jill Alexanders, PA-C  tamsulosin (FLOMAX) 0.4 MG CAPS capsule Take 1 capsule (0.4 mg total) by mouth daily. 01/04/16   Fanny Skates, MD  traMADol (ULTRAM) 50 MG tablet Take 50 mg by mouth every 6 (six) hours as needed for moderate pain.  05/21/16   [provider]  zolpidem (AMBIEN) 10 MG tablet Take 5-10 mg by mouth at bedtime as needed for sleep.     [provider]     Family History  Problem Relation Age of Onset  . Stroke Mother   . Stroke Brother   . Heart disease Brother     Social History   Social History  . Marital status: Married    Spouse name: N/A  . Number of children: N/A  . Years of education: N/A   Occupational History  .  unable to work since April    Social History Main Topics  . Smoking status: Never Smoker  . Smokeless tobacco: Former Systems developer    Types: Snuff    Quit date: 07/13/2016  . Alcohol use No  . Drug use: No  . Sexual activity: Yes   Other Topics Concern  . Not on file   Social History Narrative  . No narrative on file     Vital Signs: There were no vitals taken for this visit.  Physical Exam  Abd: soft, NT, g-tube in position and clamped off.   Drain in place with minimal clear drainage in his bag.  His drain site is c/d/i.  Drain injection today does not show tracking of contrast anywhere. Several delayed images were taken as well to confirm.  Imaging: No results found.  Labs:  CBC:  Recent Labs  07/18/16 1156 07/19/16 0343 08/03/16 1221 08/05/16 0350  WBC 13.0* 13.0* 10.5 5.6  HGB 9.7* 9.5* 9.5* 8.3*  HCT 30.2* 29.6* 30.0* 26.9*  PLT 665* 649* 442* 439*    COAGS:  Recent Labs  10/29/15 1851 10/30/15 1245 10/30/15 1636 11/06/15 0453 06/30/16 1605 07/06/16 1053 08/04/16 1016  INR 1.28 1.42 1.34 1.38 1.29  1.15 1.28  APTT 29 27 25   --  32  --   --     BMP:  Recent Labs  07/15/16 1853 07/18/16 1156 07/19/16 0343 08/03/16 1221  NA 131* 128* 131* 132*  K 4.1 4.0 3.9 4.4  CL 96* 95* 97* 94*  CO2 24 27 27 28   GLUCOSE 101* 123* 118* 129*  BUN 9 10 11 15   CALCIUM 7.8* 7.4* 7.6* 8.3*  CREATININE 0.75 0.80 0.79 0.75  GFRNONAA >60 >60 >60 >60  GFRAA >60 >60 >60 >60    LIVER FUNCTION TESTS:  Recent Labs  12/25/15 1736 12/26/15 0636 07/15/16 1853 08/03/16 1221  BILITOT 0.5 0.8 0.5 0.5  AST 26 17 15 18   ALT 21 16* 19 18  ALKPHOS 108 87 76 96  PROT 6.6 5.4* 5.2* 6.5  ALBUMIN 2.6* 2.1* 1.7* 2.2*    Assessment:  1. Intra-abdominal abscess, s/p perc drain on 08-04-16 Drain injection negative today for apparent track or fistulous connection. These findings were discussed with Dr. Ninfa Linden, who agrees with drain pull. Drain removed at bedside without difficulty. Pt will keep his scheduled appointment with Dr. Ninfa Linden next week. He will f/u with IR prn.  SignedAscencion Dike, PA-C 08/26/2016, 1:41 PM

## 2016-08-27 DIAGNOSIS — D649 Anemia, unspecified: Secondary | ICD-10-CM | POA: Diagnosis not present

## 2016-08-27 DIAGNOSIS — K5720 Diverticulitis of large intestine with perforation and abscess without bleeding: Secondary | ICD-10-CM | POA: Diagnosis not present

## 2016-08-27 DIAGNOSIS — E440 Moderate protein-calorie malnutrition: Secondary | ICD-10-CM | POA: Diagnosis not present

## 2016-08-27 DIAGNOSIS — K5730 Diverticulosis of large intestine without perforation or abscess without bleeding: Secondary | ICD-10-CM | POA: Diagnosis not present

## 2016-08-27 DIAGNOSIS — Z431 Encounter for attention to gastrostomy: Secondary | ICD-10-CM | POA: Diagnosis not present

## 2016-08-27 DIAGNOSIS — N179 Acute kidney failure, unspecified: Secondary | ICD-10-CM | POA: Diagnosis not present

## 2016-08-27 DIAGNOSIS — Z7982 Long term (current) use of aspirin: Secondary | ICD-10-CM | POA: Diagnosis not present

## 2016-08-27 DIAGNOSIS — T814XXA Infection following a procedure, initial encounter: Secondary | ICD-10-CM | POA: Diagnosis not present

## 2016-08-27 DIAGNOSIS — Z85820 Personal history of malignant melanoma of skin: Secondary | ICD-10-CM | POA: Diagnosis not present

## 2016-08-27 DIAGNOSIS — Z87442 Personal history of urinary calculi: Secondary | ICD-10-CM | POA: Diagnosis not present

## 2016-08-27 DIAGNOSIS — I10 Essential (primary) hypertension: Secondary | ICD-10-CM | POA: Diagnosis not present

## 2016-08-30 ENCOUNTER — Ambulatory Visit
Admission: RE | Admit: 2016-08-30 | Discharge: 2016-08-30 | Disposition: A | Discharge status: Home / Self Care | Payer: 59 | Source: Ambulatory Visit | Attending: Physician Assistant | Admitting: Physician Assistant

## 2016-08-30 ENCOUNTER — Other Ambulatory Visit: Payer: Self-pay | Admitting: Physician Assistant

## 2016-08-30 DIAGNOSIS — R059 Cough, unspecified: Secondary | ICD-10-CM

## 2016-08-30 DIAGNOSIS — R0602 Shortness of breath: Secondary | ICD-10-CM | POA: Diagnosis not present

## 2016-08-30 DIAGNOSIS — R05 Cough: Secondary | ICD-10-CM | POA: Diagnosis not present

## 2016-08-31 DIAGNOSIS — D649 Anemia, unspecified: Secondary | ICD-10-CM | POA: Diagnosis not present

## 2016-08-31 DIAGNOSIS — Z85820 Personal history of malignant melanoma of skin: Secondary | ICD-10-CM | POA: Diagnosis not present

## 2016-08-31 DIAGNOSIS — Z87442 Personal history of urinary calculi: Secondary | ICD-10-CM | POA: Diagnosis not present

## 2016-08-31 DIAGNOSIS — K5730 Diverticulosis of large intestine without perforation or abscess without bleeding: Secondary | ICD-10-CM | POA: Diagnosis not present

## 2016-08-31 DIAGNOSIS — I10 Essential (primary) hypertension: Secondary | ICD-10-CM | POA: Diagnosis not present

## 2016-08-31 DIAGNOSIS — Z431 Encounter for attention to gastrostomy: Secondary | ICD-10-CM | POA: Diagnosis not present

## 2016-08-31 DIAGNOSIS — T814XXA Infection following a procedure, initial encounter: Secondary | ICD-10-CM | POA: Diagnosis not present

## 2016-08-31 DIAGNOSIS — E440 Moderate protein-calorie malnutrition: Secondary | ICD-10-CM | POA: Diagnosis not present

## 2016-08-31 DIAGNOSIS — Z7982 Long term (current) use of aspirin: Secondary | ICD-10-CM | POA: Diagnosis not present

## 2016-09-10 ENCOUNTER — Encounter: Payer: Self-pay | Admitting: Interventional Radiology

## 2016-09-13 DIAGNOSIS — D649 Anemia, unspecified: Secondary | ICD-10-CM | POA: Diagnosis not present

## 2016-09-13 DIAGNOSIS — K5730 Diverticulosis of large intestine without perforation or abscess without bleeding: Secondary | ICD-10-CM | POA: Diagnosis not present

## 2016-09-13 DIAGNOSIS — E440 Moderate protein-calorie malnutrition: Secondary | ICD-10-CM | POA: Diagnosis not present

## 2016-09-13 DIAGNOSIS — Z7982 Long term (current) use of aspirin: Secondary | ICD-10-CM | POA: Diagnosis not present

## 2016-09-13 DIAGNOSIS — Z85820 Personal history of malignant melanoma of skin: Secondary | ICD-10-CM | POA: Diagnosis not present

## 2016-09-13 DIAGNOSIS — T814XXA Infection following a procedure, initial encounter: Secondary | ICD-10-CM | POA: Diagnosis not present

## 2016-09-13 DIAGNOSIS — Z431 Encounter for attention to gastrostomy: Secondary | ICD-10-CM | POA: Diagnosis not present

## 2016-09-13 DIAGNOSIS — I10 Essential (primary) hypertension: Secondary | ICD-10-CM | POA: Diagnosis not present

## 2016-09-13 DIAGNOSIS — Z87442 Personal history of urinary calculi: Secondary | ICD-10-CM | POA: Diagnosis not present

## 2016-09-16 DIAGNOSIS — M545 Low back pain: Secondary | ICD-10-CM | POA: Diagnosis not present

## 2016-09-16 DIAGNOSIS — G4700 Insomnia, unspecified: Secondary | ICD-10-CM | POA: Diagnosis not present

## 2016-09-16 DIAGNOSIS — R42 Dizziness and giddiness: Secondary | ICD-10-CM | POA: Diagnosis not present

## 2016-09-17 DIAGNOSIS — Z87442 Personal history of urinary calculi: Secondary | ICD-10-CM | POA: Diagnosis not present

## 2016-09-17 DIAGNOSIS — Z85820 Personal history of malignant melanoma of skin: Secondary | ICD-10-CM | POA: Diagnosis not present

## 2016-09-17 DIAGNOSIS — Z7982 Long term (current) use of aspirin: Secondary | ICD-10-CM | POA: Diagnosis not present

## 2016-09-17 DIAGNOSIS — I10 Essential (primary) hypertension: Secondary | ICD-10-CM | POA: Diagnosis not present

## 2016-09-17 DIAGNOSIS — T814XXA Infection following a procedure, initial encounter: Secondary | ICD-10-CM | POA: Diagnosis not present

## 2016-09-17 DIAGNOSIS — Z431 Encounter for attention to gastrostomy: Secondary | ICD-10-CM | POA: Diagnosis not present

## 2016-09-17 DIAGNOSIS — K5730 Diverticulosis of large intestine without perforation or abscess without bleeding: Secondary | ICD-10-CM | POA: Diagnosis not present

## 2016-09-17 DIAGNOSIS — E440 Moderate protein-calorie malnutrition: Secondary | ICD-10-CM | POA: Diagnosis not present

## 2016-09-17 DIAGNOSIS — D649 Anemia, unspecified: Secondary | ICD-10-CM | POA: Diagnosis not present

## 2016-09-27 DIAGNOSIS — N179 Acute kidney failure, unspecified: Secondary | ICD-10-CM | POA: Diagnosis not present

## 2016-09-27 DIAGNOSIS — K5720 Diverticulitis of large intestine with perforation and abscess without bleeding: Secondary | ICD-10-CM | POA: Diagnosis not present

## 2016-09-27 MED FILL — TAMSULOSIN HCL 0.4 MG CAP: 0.4 | 30 days supply | Qty: 30 | Fill #4

## 2016-09-28 MED FILL — FAMOTIDINE 20 MG TABLET: 20 | 30 days supply | Qty: 120 | Fill #0

## 2016-10-15 DIAGNOSIS — M545 Low back pain: Secondary | ICD-10-CM | POA: Diagnosis not present

## 2016-10-15 DIAGNOSIS — R2232 Localized swelling, mass and lump, left upper limb: Secondary | ICD-10-CM | POA: Diagnosis not present

## 2016-10-15 DIAGNOSIS — I10 Essential (primary) hypertension: Secondary | ICD-10-CM | POA: Diagnosis not present

## 2016-10-15 DIAGNOSIS — R2231 Localized swelling, mass and lump, right upper limb: Secondary | ICD-10-CM | POA: Diagnosis not present

## 2016-10-18 ENCOUNTER — Ambulatory Visit (INDEPENDENT_AMBULATORY_CARE_PROVIDER_SITE_OTHER): Payer: 59 | Admitting: Orthopaedic Surgery

## 2016-10-18 ENCOUNTER — Other Ambulatory Visit (INDEPENDENT_AMBULATORY_CARE_PROVIDER_SITE_OTHER): Payer: Self-pay

## 2016-10-18 DIAGNOSIS — M545 Low back pain, unspecified: Secondary | ICD-10-CM

## 2016-10-18 DIAGNOSIS — M25512 Pain in left shoulder: Secondary | ICD-10-CM | POA: Diagnosis not present

## 2016-10-18 DIAGNOSIS — M549 Dorsalgia, unspecified: Secondary | ICD-10-CM

## 2016-10-18 DIAGNOSIS — G8929 Other chronic pain: Secondary | ICD-10-CM | POA: Diagnosis not present

## 2016-10-18 MED ORDER — LIDOCAINE HCL 1 % IJ SOLN
3.0000 mL | INTRAMUSCULAR | Status: AC | PRN
  Administered 2016-10-18: 3 mL

## 2016-10-18 MED ORDER — METHYLPREDNISOLONE ACETATE 40 MG/ML IJ SUSP
40.0000 mg | INTRAMUSCULAR | Status: AC | PRN
  Administered 2016-10-18: 40 mg via INTRA_ARTICULAR

## 2016-10-18 NOTE — Progress Notes (Signed)
Office Visit Note   Patient: Miguel [name]           Date of Birth: 04/06/1950           MRN: [id number] Visit Date: 10/18/2016              Requested by: Alroy Dust, L.Marlou Sa, [place] [employer] [address] [place], [place] [address] PCP: Alroy Dust, L.Marlou Sa, MD   Assessment & Plan: Visit Diagnoses:  1. Chronic left shoulder pain   2. Chronic midline low back pain without sciatica     Plan: I think he would benefit most from outpatient physical therapy on his thoracic and lumbar spine to help with his posture and other modalities to decrease his pain such as e-stim, iontophoresis and phonophoresis as well as considering a TENS unit. I agree with him having a steroid injection in his left shoulder knee tolerated this well. We'll see him back in a month to see how therapy is done for him.  Follow-Up Instructions: Return in about 4 weeks (around 11/15/2016).   Orders:  Orders Placed This Encounter  Procedures  . Large Joint Injection/Arthrocentesis   No orders of the defined types were placed in this encounter.     Procedures: Large Joint Inj Date/Time: 10/18/2016 10:40 AM Performed by: Mcarthur Rossetti Authorized by: Mcarthur Rossetti   Location:  Shoulder Site:  L subacromial bursa Ultrasound Guidance: No   Fluoroscopic Guidance: No   Arthrogram: No   Medications:  3 mL lidocaine 1 %; 40 mg methylPREDNISolone acetate 40 MG/ML     Clinical Data: No additional findings.   Subjective: No chief complaint on file. The patient's last seen before. He has chronic bowel issues and is at multiple surgeries on his abdomen. I see him though from time to time for his back and his shoulder. It IS injected his left shoulder. It hurts with overhead activities and he like [place] to inject his left shoulder today. He also has some mid low back pain. He's lost a lot of weight over time and it's effect his posture as well. It hurts mainly when he lays down. There is no radicular  component of his back pain.  HPI  Review of Systems He currently denies any headache, chest pain, fever, chills, nausea, vomiting, shortness of breath  Objective: Vital Signs: There were no vitals taken for this visit.  Physical Exam He is alert and oriented 3 in no acute distress. He is cachectic Ortho Exam Examination of his lumbar spine and thoracic spine show more kyphosis just from posture. He has paraspinal pain but no radicular pain. He's got a negative straight leg raise bilaterally. His left shoulder has fluid range of motion but is painful in the subacromial outlet with good strength. Specialty Comments:  No specialty comments available.  Imaging: No results found.   PMFS History: Patient Active Problem List   Diagnosis Date Noted  . Malnutrition of moderate degree 07/19/2016  . Intra-abdominal abscess ([place]) 07/16/2016  . Cellulitis 07/15/2016  . S/P colostomy takedown 06/28/2016  . Pain in thoracic spine 06/09/2016  . Mid back pain 06/09/2016  . Postoperative fever 11/19/2015  . 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 Severe protein-calorie malnutrition ([place]) 11/17/2015  . Sepsis ([place]) 11/14/2015  . Hiccups 11/14/2015  . AKI (acute kidney injury) ([place]) 11/14/2015  . Fever   . Leg swelling   . Left shoulder pain   . Muscle spasm of left shoulder   . GI bleed 10/27/2015  .  Acute blood loss anemia 10/27/2015  . Syncope 10/27/2015  . Hyperglycemia 10/27/2015  . Hypotension 10/27/2015  . Neck pain 10/27/2015  . Hematemesis 10/27/2015  . Hematochezia 10/27/2015  . Diverticulitis of colon with perforation 07/05/2015   Past Medical History:  Diagnosis Date  . Anemia   . Anxiety   . BPH (benign prostatic hyperplasia)   . Gastritis   . GERD (gastroesophageal reflux disease)    "seldom" (07/16/2016)  . GI bleed due to NSAIDs 10/27/2015  . History of blood transfusion 06/2016   post OR/notes 07/15/2016  . History of hiatal hernia   . History of kidney stones    . Hyperlipidemia   . Hypertension   . Melanoma of back ([place])    "mid back"  . Sigmoid diverticulitis    with perforation    Family History  Problem Relation Age of Onset  . Stroke Mother   . Stroke Brother   . Heart disease Brother     Past Surgical History:  Procedure Laterality Date  . COLON SURGERY     sigmoid  . COLOSTOMY TAKEDOWN N/A 06/28/2016   Procedure: COLOSTOMY TAKEDOWN;  Surgeon: [name], MD;  Location: [place];  Service: General;  Laterality: N/A;  . ESOPHAGOGASTRODUODENOSCOPY N/A 10/27/2015   Procedure: ESOPHAGOGASTRODUODENOSCOPY (EGD);  Surgeon: Clarene Essex, MD;  Location: [facility] ENDOSCOPY;  Service: Endoscopy;  Laterality: N/A;  . ESOPHAGOGASTRODUODENOSCOPY N/A 10/29/2015   Procedure: ESOPHAGOGASTRODUODENOSCOPY (EGD);  Surgeon: Ronald Lobo, MD;  Location: [facility] ENDOSCOPY;  Service: Endoscopy;  Laterality: N/A;  . ESOPHAGOGASTRODUODENOSCOPY N/A 10/30/2015   Procedure: ESOPHAGOGASTRODUODENOSCOPY (EGD);  Surgeon: Clarene Essex, MD;  Location[facility] ENDOSCOPY;  Service: Endoscopy;  Laterality: N/A;  . GASTROSTOMY TUBE PLACEMENT  11/21/2015   REDUCTION OF HIATAL HERNIA , REPAIR HIATAL HERNIA, RESECTION SMALL BOWEL WITH ANASTOMOSIS, PLACEMENT GASTROSTOMY TUBE, PLACEMENT DUODENOSTOMY TUBE (N/A)  . HEMORRHOID BANDING  X 2  . HERNIA REPAIR    . HIATAL HERNIA REPAIR N/A 11/21/2015   Procedure: REDUCTION OF HIATAL HERNIA , REPAIR HIATAL HERNIA, RESECTION SMALL BOWEL WITH ANASTOMOSIS, PLACEMENT GASTROSTOMY TUBE, PLACEMENT DUODENOSTOMY TUBE;  Surgeon: Mickeal Skinner, MD;  Location: [place];  Service: General;  Laterality: N/A;  . [place]  06/28/2016   open/notes 07/15/2016  . IR GUIDED DRAIN W CATHETER PLACEMENT  07/06/2016   /NOTES 07/15/2016  . IR RADIOLOGIST EVAL & MGMT  07/27/2016  . IR RADIOLOGIST EVAL & MGMT  08/17/2016  . IR RADIOLOGIST EVAL & MGMT  08/26/2016  . IR [place] TUBE PERCUT W/FLUORO  08/04/2016  . IR US GUIDE BX ASP/DRAIN  08/04/2016  . KNEE  CARTILAGE SURGERY Right 1971   "opened me up"  . LAPAROTOMY N/A 07/05/2015   Procedure: PARTIAL SIGMOID COLECTOMY AND COLOSTOMY;  Surgeon: [name], MD;  Location: [place];  Service: General;  Laterality: N/A;  . MELANOMA EXCISION  2001  . 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 REPAIR OF PERFORATED ULCER N/A 10/30/2015   Procedure: REPAIR OF BLEEDING  ULCER;  Surgeon: Judeth Horn, MD;  Location: [place];  Service: General;  Laterality: N/A;  . TUMOR EXCISION  2009   "back; fatty tumor"   Social History   Occupational History  . unable to work since April    Social History Main Topics  . Smoking status: Never Smoker  . Smokeless tobacco: Former Systems developer  Types: Snuff    Quit date: 07/13/2016  . Alcohol use No  . Drug use: No  . Sexual activity: Yes

## 2016-10-27 DIAGNOSIS — N179 Acute kidney failure, unspecified: Secondary | ICD-10-CM | POA: Diagnosis not present

## 2016-10-27 DIAGNOSIS — K5720 Diverticulitis of large intestine with perforation and abscess without bleeding: Secondary | ICD-10-CM | POA: Diagnosis not present

## 2016-10-29 MED FILL — TAMSULOSIN HCL 0.4 MG CAP: 0.4 | 30 days supply | Qty: 30 | Fill #5

## 2016-11-03 ENCOUNTER — Ambulatory Visit: Payer: 59 | Attending: Family Medicine

## 2016-11-03 DIAGNOSIS — M546 Pain in thoracic spine: Secondary | ICD-10-CM | POA: Diagnosis not present

## 2016-11-03 DIAGNOSIS — R293 Abnormal posture: Secondary | ICD-10-CM | POA: Diagnosis not present

## 2016-11-03 DIAGNOSIS — M62830 Muscle spasm of back: Secondary | ICD-10-CM | POA: Insufficient documentation

## 2016-11-03 DIAGNOSIS — M6281 Muscle weakness (generalized): Secondary | ICD-10-CM | POA: Insufficient documentation

## 2016-11-03 NOTE — Therapy (Signed)
[place], [name], [address] Phone: [phone]   Fax:  [phone]  Physical Therapy Evaluation  Patient Details  Name: Miguel [name] MRN: [id number] Date of Birth: [birth year] Referring Provider: Jean Rosenthal, MD  Encounter Date: 11/03/2016      PT End of Session - 11/03/16 1305    Visit Number 1   Number of Visits 12   Date for PT Re-Evaluation 12/17/16   Authorization Type UMR / Medicare   PT Start Time 1230   PT Stop Time 1315   PT Time Calculation (min) 45 min   Activity Tolerance Patient tolerated treatment well;Patient limited by pain   Behavior During Therapy [facility] for tasks assessed/performed      Past Medical History:  Diagnosis Date  . Anemia   . Anxiety   . BPH (benign prostatic hyperplasia)   . Gastritis   . GERD (gastroesophageal reflux disease)    "seldom" (07/16/2016)  . GI bleed due to NSAIDs 10/27/2015  . History of blood transfusion 06/2016   post OR/notes 07/15/2016  . History of hiatal hernia   . History of kidney stones   . Hyperlipidemia   . Hypertension   . Melanoma of back ([place])    "mid back"  . Sigmoid diverticulitis    with perforation    Past Surgical History:  Procedure Laterality Date  . COLON SURGERY     sigmoid  . COLOSTOMY TAKEDOWN N/A 06/28/2016   Procedure: COLOSTOMY TAKEDOWN;  Surgeon: [name], MD;  Location: [place];  Service: General;  Laterality: N/A;  . ESOPHAGOGASTRODUODENOSCOPY N/A 10/27/2015   Procedure: ESOPHAGOGASTRODUODENOSCOPY (EGD);  Surgeon: Clarene Essex, MD;  Location: [facility] ENDOSCOPY;  Service: Endoscopy;  Laterality: N/A;  . ESOPHAGOGASTRODUODENOSCOPY N/A 10/29/2015   Procedure: ESOPHAGOGASTRODUODENOSCOPY (EGD);  Surgeon: Ronald Lobo, MD;  Location: [facility] ENDOSCOPY;  Service: Endoscopy;  Laterality: N/A;  . ESOPHAGOGASTRODUODENOSCOPY N/A 10/30/2015   Procedure: ESOPHAGOGASTRODUODENOSCOPY (EGD);  Surgeon: Clarene Essex, MD;  Location:  [facility] ENDOSCOPY;  Service: Endoscopy;  Laterality: N/A;  . GASTROSTOMY TUBE PLACEMENT  11/21/2015   REDUCTION OF HIATAL HERNIA , REPAIR HIATAL HERNIA, RESECTION SMALL BOWEL WITH ANASTOMOSIS, PLACEMENT GASTROSTOMY TUBE, PLACEMENT DUODENOSTOMY TUBE (N/A)  . HEMORRHOID BANDING  X 2  . HERNIA REPAIR    . HIATAL HERNIA REPAIR N/A 11/21/2015   Procedure: REDUCTION OF HIATAL HERNIA , REPAIR HIATAL HERNIA, RESECTION SMALL BOWEL WITH ANASTOMOSIS, PLACEMENT GASTROSTOMY TUBE, PLACEMENT DUODENOSTOMY TUBE;  Surgeon: Mickeal Skinner, MD;  Location: [place];  Service: General;  Laterality: N/A;  . [place]  06/28/2016   open/notes 07/15/2016  . IR GUIDED DRAIN W CATHETER PLACEMENT  07/06/2016   /NOTES 07/15/2016  . IR RADIOLOGIST EVAL & MGMT  07/27/2016  . IR RADIOLOGIST EVAL & MGMT  08/17/2016  . IR RADIOLOGIST EVAL & MGMT  08/26/2016  . IR [place] TUBE PERCUT W/FLUORO  08/04/2016  . IR US GUIDE BX ASP/DRAIN  08/04/2016  . KNEE CARTILAGE SURGERY Right 1971   "opened me up"  . LAPAROTOMY N/A 07/05/2015   Procedure: PARTIAL SIGMOID COLECTOMY AND COLOSTOMY;  Surgeon: [name], MD;  Location: [place];  Service: General;  Laterality: N/A;  . MELANOMA EXCISION  2001  . 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 REPAIR OF PERFORATED ULCER N/A 10/30/2015   Procedure: REPAIR OF BLEEDING  ULCER;  Surgeon: Judeth Horn, MD;  Location: [place];  Service: General;  Laterality: N/A;  . TUMOR EXCISION  2009   "back; fatty tumor"    There were no vitals filed for this visit.       Subjective Assessment - 11/03/16 1239    Subjective He reports pain in bend of back. MD sent me here.  In past year 4 surgeries. ( abdominal Sx)  No back pain prior to surgery.    Limitations Lifting;Standing;Walking;House hold activities   How long can you sit comfortably? Not sure    How long can you stand comfortably? 5-10 min   How long can you walk comfortably? 1/4 mile   Diagnostic tests x ray: OA   Patient Stated Goals Get releif from pain   Currently in Pain? Yes   Pain Score 5    Pain Location Back   Pain Orientation Posterior;Mid   Pain Descriptors / Indicators Aching;Spasm  gnawing   Pain Type Chronic pain   Pain Onset More than a month ago   Pain Frequency Constant   Aggravating Factors  activity , being on feet   Pain Relieving Factors lying , meds, heat   Multiple Pain Sites No            [facility] PT Assessment - 11/03/16 0001      Assessment   Medical Diagnosis mid back pain   Referring Provider Jean Rosenthal, MD   Onset Date/Surgical Date --  06/2015   Next MD Visit As needed   Prior Therapy x2 prior to surgery     Precautions   Precautions None     Restrictions   Weight Bearing Restrictions No     Balance Screen   Has the patient fallen in the past 6 months No   Has the patient had a decrease in activity level because of a fear of falling?  No   Is the patient reluctant to leave their home because of a fear of falling?  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  RT knee flex and valgus , crepitus noted RT knee     ROM / Strength   AROM / PROM / Strength AROM;PROM;Strength     AROM   AROM Assessment Site Lumbar   Lumbar Flexion 45   Lumbar Extension 15  starts at 10 degrees extension   Lumbar - Right Side Bend 10   Lumbar - Left Side Bend 10   Lumbar - Right Rotation 30   Lumbar - Left Rotation 40     PROM   Overall  PROM Comments RT knee -10 degrees ext, hip ER 45 deg  IR 10 degrees Lt hip IR 10 deg   ER 55 deg       Strength   Overall Strength Comments Normal LE strength   Abdominals fair plus from multiple surgeries     Flexibility   Soft Tissue Assessment /Muscle Length yes   Hamstrings 50 degrees    Quadriceps 90 deg knee flex prone bilaterally     Palpation  Palpation comment tender thoraco lumbar area bilaterally            Objective measurements completed on examination: See above findings.                  PT Education - 11/03/16 1304    Education provided Yes   Education Details POC, HEP flex stretch 2-3 reps with support 2-3x/day, DN info, sitting support and standing posture correction for sway back   Person(s) Educated Patient   Methods Explanation;Demonstration;Verbal cues;Handout;Tactile cues   Comprehension Verbalized understanding;Returned demonstration          PT Short Term Goals - 11/03/16 1330      PT SHORT TERM GOAL #1   Title Pt will be indepenent in HEP as it has been established    Time 2   Period Weeks   Status New     PT SHORT TERM GOAL #2   Title Pt will verbalize improvement in postural awareness with functional activities   Time 3   Period Weeks   Status New     PT SHORT TERM GOAL #3   Title Pt will be able to sit for at least 20 min pain <=4/10   Time 3   Period Weeks   Status New           PT Long Term Goals - 11/03/16 1237      PT LONG TERM GOAL #1   Title He will be independent with all HEp issued    Time 6   Period Weeks   Status New     PT LONG TERM GOAL #2   Title His back pain will become intermittant with activity on feet   Time 6   Period Weeks   Status New     PT LONG TERM GOAL #3   Title He will be able to be on feet for 30-45 min before pain starts   Time 6   Period Weeks   Status New     PT LONG TERM GOAL #4   Title He will be able to sit with no pain for 30 min   Time 6   Period Weeks    Status New     PT LONG TERM GOAL #5   Title FOTO score will improve to < 20% limited   Time 6   Period Weeks   Status New                Plan - 11/03/16 1306    Clinical Impression Statement Mr Ganaway presents with chronic thoraco lumbar pain for > one year duration limiting time on feet and sitting comfort. He has multiple stuctural changes that affect posture and weakness off cor partly due to multiple surgeries over past year.       History and Personal Factors relevant to plan of care: multiple abdominal surgery, OA RT knee   Clinical Presentation Unstable   Clinical Presentation due to: chronic pain /postureal changes and core weakness    Clinical Decision Making Moderate   Rehab Potential Good   PT Frequency 2x / week   PT Duration 6 weeks   PT Treatment/Interventions Electrical Stimulation;Iontophoresis 4mg /ml Dexamethasone;Moist Heat;Ultrasound;Patient/family education;Passive range of motion;Manual techniques;Therapeutic exercise;Therapeutic activities;Dry needling;Taping   PT Next Visit Plan Core strength for HEP , manual for hip and spine ROM , DN if available   PT Home Exercise Plan posture awareness, stretching of spine   Consulted and Agree with Plan of Care Patient  Patient will benefit from skilled therapeutic intervention in order to improve the following deficits and impairments:  Pain, Decreased activity tolerance, Postural dysfunction, Decreased strength, Decreased range of motion, Difficulty walking, Increased muscle spasms  Visit Diagnosis: Pain in thoracic spine  Abnormal posture  Muscle spasm of back  Muscle weakness (generalized)      G-Codes - 11-11-2016 1333    Functional Assessment Tool Used (Outpatient Only) FOTO 46% limited   Functional Limitation Mobility: Walking and moving around   Mobility: Walking and Moving Around Current Status [phone]) At least 40 percent but less than 60 percent impaired, limited or restricted   Mobility:  Walking and Moving Around Goal Status [phone]) At least 1 percent but less than 20 percent impaired, limited or restricted       Problem List Patient Active Problem List   Diagnosis Date Noted  . Malnutrition of moderate degree 07/19/2016  . Intra-abdominal abscess ([place]) 07/16/2016  . Cellulitis 07/15/2016  . S/P colostomy takedown 06/28/2016  . Pain in thoracic spine 06/09/2016  . Mid back pain 06/09/2016  . Postoperative fever 11/19/2015  . 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 Severe protein-calorie malnutrition ([place]) 11/17/2015  . Sepsis ([place]) 11/14/2015  . Hiccups 11/14/2015  . AKI (acute kidney injury) ([place]) 11/14/2015  . Fever   . Leg swelling   . Left shoulder pain   . Muscle spasm of left shoulder   . GI bleed 10/27/2015  . Acute blood loss anemia 10/27/2015  . Syncope 10/27/2015  . Hyperglycemia 10/27/2015  . Hypotension 10/27/2015  . Neck pain 10/27/2015  . Hematemesis 10/27/2015  . Hematochezia 10/27/2015  . Diverticulitis of colon with perforation 07/05/2015    Darrel Hoover  PT 11/11/2016, 1:34 PM  [facility] [address] [place], [name], [address] Phone: [phone]   Fax:  [phone]  Name: Miguel [name] MRN: [id number] Date of Birth: 1950-07-10

## 2016-11-09 ENCOUNTER — Ambulatory Visit: Payer: 59

## 2016-11-09 DIAGNOSIS — M546 Pain in thoracic spine: Secondary | ICD-10-CM | POA: Diagnosis not present

## 2016-11-09 DIAGNOSIS — M62830 Muscle spasm of back: Secondary | ICD-10-CM | POA: Diagnosis not present

## 2016-11-09 DIAGNOSIS — M6281 Muscle weakness (generalized): Secondary | ICD-10-CM | POA: Diagnosis not present

## 2016-11-09 DIAGNOSIS — R293 Abnormal posture: Secondary | ICD-10-CM

## 2016-11-09 NOTE — Therapy (Signed)
[place], [name], [address] Phone: [phone]   Fax:  [phone]  Physical Therapy Treatment  Patient Details  Name: Miguel [name] MRN: [id number] Date of Birth: 11-08-1950 Referring Provider: Jean Rosenthal, MD  Encounter Date: 11/09/2016      PT End of Session - 11/09/16 1330    Visit Number 2   Number of Visits 12   Date for PT Re-Evaluation 12/17/16   Authorization Type UMR / Medicare   PT Start Time 0125   PT Stop Time 0220   PT Time Calculation (min) 55 min   Activity Tolerance Patient tolerated treatment well;No increased pain   Behavior During Therapy [facility] for tasks assessed/performed      Past Medical History:  Diagnosis Date  . Anemia   . Anxiety   . BPH (benign prostatic hyperplasia)   . Gastritis   . GERD (gastroesophageal reflux disease)    "seldom" (07/16/2016)  . GI bleed due to NSAIDs 10/27/2015  . History of blood transfusion 06/2016   post OR/notes 07/15/2016  . History of hiatal hernia   . History of kidney stones   . Hyperlipidemia   . Hypertension   . Melanoma of back ([place])    "mid back"  . Sigmoid diverticulitis    with perforation    Past Surgical History:  Procedure Laterality Date  . COLON SURGERY     sigmoid  . COLOSTOMY TAKEDOWN N/A 06/28/2016   Procedure: COLOSTOMY TAKEDOWN;  Surgeon: [name], MD;  Location: [place];  Service: General;  Laterality: N/A;  . ESOPHAGOGASTRODUODENOSCOPY N/A 10/27/2015   Procedure: ESOPHAGOGASTRODUODENOSCOPY (EGD);  Surgeon: Clarene Essex, MD;  Location: [facility] ENDOSCOPY;  Service: Endoscopy;  Laterality: N/A;  . ESOPHAGOGASTRODUODENOSCOPY N/A 10/29/2015   Procedure: ESOPHAGOGASTRODUODENOSCOPY (EGD);  Surgeon: Ronald Lobo, MD;  Location: [facility] ENDOSCOPY;  Service: Endoscopy;  Laterality: N/A;  . ESOPHAGOGASTRODUODENOSCOPY N/A 10/30/2015   Procedure: ESOPHAGOGASTRODUODENOSCOPY (EGD);  Surgeon: Clarene Essex, MD;  Location: [facility]  ENDOSCOPY;  Service: Endoscopy;  Laterality: N/A;  . GASTROSTOMY TUBE PLACEMENT  11/21/2015   REDUCTION OF HIATAL HERNIA , REPAIR HIATAL HERNIA, RESECTION SMALL BOWEL WITH ANASTOMOSIS, PLACEMENT GASTROSTOMY TUBE, PLACEMENT DUODENOSTOMY TUBE (N/A)  . HEMORRHOID BANDING  X 2  . HERNIA REPAIR    . HIATAL HERNIA REPAIR N/A 11/21/2015   Procedure: REDUCTION OF HIATAL HERNIA , REPAIR HIATAL HERNIA, RESECTION SMALL BOWEL WITH ANASTOMOSIS, PLACEMENT GASTROSTOMY TUBE, PLACEMENT DUODENOSTOMY TUBE;  Surgeon: Mickeal Skinner, MD;  Location: [place];  Service: General;  Laterality: N/A;  . [place]  06/28/2016   open/notes 07/15/2016  . IR GUIDED DRAIN W CATHETER PLACEMENT  07/06/2016   /NOTES 07/15/2016  . IR RADIOLOGIST EVAL & MGMT  07/27/2016  . IR RADIOLOGIST EVAL & MGMT  08/17/2016  . IR RADIOLOGIST EVAL & MGMT  08/26/2016  . IR [place] TUBE PERCUT W/FLUORO  08/04/2016  . IR US GUIDE BX ASP/DRAIN  08/04/2016  . KNEE CARTILAGE SURGERY Right 1971   "opened me up"  . LAPAROTOMY N/A 07/05/2015   Procedure: PARTIAL SIGMOID COLECTOMY AND COLOSTOMY;  Surgeon: [name], MD;  Location: [place];  Service: General;  Laterality: N/A;  . MELANOMA EXCISION  2001  . 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  10/30/2015   Procedure: REMOVAL OF DISTAL STOMACH;  Surgeon: Judeth Horn, MD;  Location: [place];  Service: General;;  . REPAIR OF PERFORATED ULCER N/A 10/30/2015   Procedure: REPAIR OF BLEEDING  ULCER;  Surgeon: Judeth Horn, MD;  Location: [place];  Service: General;  Laterality: N/A;  . TUMOR EXCISION  2009   "back; fatty tumor"    There were no vitals filed for this visit.      Subjective Assessment - 11/09/16 1329    Subjective No chanes .5/10 pain today , not bad.    Currently in Pain? Yes   Pain Score 5    Pain Location Back   Pain Orientation Mid;Posterior   Pain Descriptors / Indicators Aching;Spasm   Pain Type Chronic pain   Pain Onset More than a month  ago   Pain Frequency Constant   Aggravating Factors  activity on feet   Pain Relieving Factors lying down, meds , heat                         [facility] Adult PT Treatment/Exercise - 11/09/16 0001      Exercises   Exercises Lumbar     Lumbar Exercises: Sidelying   Other Sidelying Lumbar Exercises arm/book openings RT and LT x 10 cued to follow with head and to      Knee/Hip Exercises: Aerobic   Nustep L4 6 min UE and LE     Modalities   Modalities Electrical Stimulation;Moist Heat     Moist Heat Therapy   Number Minutes Moist Heat 15 Minutes   Moist Heat Location Lumbar Spine  thoracic     Electrical Stimulation   Electrical Stimulation Location mid back   Electrical Stimulation Action IFC   Electrical Stimulation Parameters L10   Electrical Stimulation Goals Pain     Manual Therapy   Manual Therapy Joint mobilization;Soft tissue mobilization;Manual Traction   Joint Mobilization PA Gr 3-4 T2 to L1 central and LT  then reached bilat up wall with deep breathing 2 sets of 3 breaths   Manual Traction to cervical spine sustained  30-45 sec x 6 reps                  PT Education - 11/09/16 1414    Education provided Yes   Education Details decomp and thoracic rotaiton stretching   Person(s) Educated Patient   Methods Explanation;Tactile cues;Verbal cues;Handout   Comprehension Returned demonstration;Verbalized understanding          PT Short Term Goals - 11/03/16 1330      PT SHORT TERM GOAL #1   Title Pt will be indepenent in HEP as it has been established    Time 2   Period Weeks   Status New     PT SHORT TERM GOAL #2   Title Pt will verbalize improvement in postural awareness with functional activities   Time 3   Period Weeks   Status New     PT SHORT TERM GOAL #3   Title Pt will be able to sit for at least 20 min pain <=4/10   Time 3   Period Weeks   Status New           PT Long Term Goals - 11/03/16 1237      PT LONG TERM  GOAL #1   Title He will be independent with all HEp issued    Time 6   Period Weeks   Status New     PT LONG TERM GOAL #2   Title His back pain will become intermittant with activity on feet   Time 6   Period Weeks   Status New  PT LONG TERM GOAL #3   Title He will be able to be on feet for 30-45 min before pain starts   Time 6   Period Weeks   Status New     PT LONG TERM GOAL #4   Title He will be able to sit with no pain for 30 min   Time 6   Period Weeks   Status New     PT LONG TERM GOAL #5   Title FOTO score will improve to < 20% limited   Time 6   Period Weeks   Status New               Plan - 11/09/16 1328    Clinical Impression Statement No change from eval . moderate level mid back pain. tolerated exercises without incr pain  He liked the heat and stim   PT Treatment/Interventions Electrical Stimulation;Iontophoresis 4mg /ml Dexamethasone;Moist Heat;Ultrasound;Patient/family education;Passive range of motion;Manual techniques;Therapeutic exercise;Therapeutic activities;Dry needling;Taping   PT Next Visit Plan Core strength for HEP , manual for hip and spine ROM , DN if available  Add wall reaching with deep breath next visit   PT Home Exercise Plan posture awareness, stretching of spine, arm opening, decompression   Consulted and Agree with Plan of Care Patient      Patient will benefit from skilled therapeutic intervention in order to improve the following deficits and impairments:  Pain, Decreased activity tolerance, Postural dysfunction, Decreased strength, Decreased range of motion, Difficulty walking, Increased muscle spasms  Visit Diagnosis: Pain in thoracic spine  Muscle spasm of back  Muscle weakness (generalized)  Abnormal posture     Problem List Patient Active Problem List   Diagnosis Date Noted  . Malnutrition of moderate degree 07/19/2016  . Intra-abdominal abscess ([place]) 07/16/2016  . Cellulitis 07/15/2016  . S/P colostomy  takedown 06/28/2016  . Pain in thoracic spine 06/09/2016  . Mid back pain 06/09/2016  . Postoperative fever 11/19/2015  . 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 Severe protein-calorie malnutrition ([place]) 11/17/2015  . Sepsis ([place]) 11/14/2015  . Hiccups 11/14/2015  . AKI (acute kidney injury) ([place]) 11/14/2015  . Fever   . Leg swelling   . Left shoulder pain   . Muscle spasm of left shoulder   . GI bleed 10/27/2015  . Acute blood loss anemia 10/27/2015  . Syncope 10/27/2015  . Hyperglycemia 10/27/2015  . Hypotension 10/27/2015  . Neck pain 10/27/2015  . Hematemesis 10/27/2015  . Hematochezia 10/27/2015  . Diverticulitis of colon with perforation 07/05/2015    Darrel Hoover  PT 11/09/2016, 2:16 PM  [facility] [address] [place], [name], [address] Phone: [phone]   Fax:  [phone]  Name: HANAD LEINO MRN: [id number] Date of Birth: [birth year]

## 2016-11-09 NOTE — Patient Instructions (Signed)
Decompression exer 1-2x/day 5-10 reps 5-10 sec hold.........arm openiings x 10 RT/Lt

## 2016-11-11 ENCOUNTER — Ambulatory Visit: Payer: 59 | Admitting: Physical Therapy

## 2016-11-11 ENCOUNTER — Encounter: Payer: Self-pay | Admitting: Physical Therapy

## 2016-11-11 DIAGNOSIS — M546 Pain in thoracic spine: Secondary | ICD-10-CM

## 2016-11-11 DIAGNOSIS — R293 Abnormal posture: Secondary | ICD-10-CM

## 2016-11-11 DIAGNOSIS — M6281 Muscle weakness (generalized): Secondary | ICD-10-CM | POA: Diagnosis not present

## 2016-11-11 DIAGNOSIS — M62830 Muscle spasm of back: Secondary | ICD-10-CM | POA: Diagnosis not present

## 2016-11-11 NOTE — Therapy (Addendum)
[place], [name], [address] Phone: [phone]   Fax:  [phone]  Physical Therapy Treatment  Patient Details  Name: Miguel [name] MRN: [id number] Date of Birth: January 21, 1951 Referring Provider: Jean Rosenthal, MD  Encounter Date: 11/11/2016      PT End of Session - 11/11/16 1344    Visit Number 3   Number of Visits 12   Date for PT Re-Evaluation 12/17/16   Authorization Type UMR / Medicare   PT Start Time 3536   PT Stop Time 1430   PT Time Calculation (min) 56 min   Activity Tolerance Patient tolerated treatment well   Behavior During Therapy [facility] for tasks assessed/performed      Past Medical History:  Diagnosis Date  . Anemia   . Anxiety   . BPH (benign prostatic hyperplasia)   . Gastritis   . GERD (gastroesophageal reflux disease)    "seldom" (07/16/2016)  . GI bleed due to NSAIDs 10/27/2015  . History of blood transfusion 06/2016   post OR/notes 07/15/2016  . History of hiatal hernia   . History of kidney stones   . Hyperlipidemia   . Hypertension   . Melanoma of back ([place])    "mid back"  . Sigmoid diverticulitis    with perforation    Past Surgical History:  Procedure Laterality Date  . COLON SURGERY     sigmoid  . COLOSTOMY TAKEDOWN N/A 06/28/2016   Procedure: COLOSTOMY TAKEDOWN;  Surgeon: [name], MD;  Location: [place];  Service: General;  Laterality: N/A;  . ESOPHAGOGASTRODUODENOSCOPY N/A 10/27/2015   Procedure: ESOPHAGOGASTRODUODENOSCOPY (EGD);  Surgeon: Clarene Essex, MD;  Location: [facility] ENDOSCOPY;  Service: Endoscopy;  Laterality: N/A;  . ESOPHAGOGASTRODUODENOSCOPY N/A 10/29/2015   Procedure: ESOPHAGOGASTRODUODENOSCOPY (EGD);  Surgeon: Ronald Lobo, MD;  Location: [facility] ENDOSCOPY;  Service: Endoscopy;  Laterality: N/A;  . ESOPHAGOGASTRODUODENOSCOPY N/A 10/30/2015   Procedure: ESOPHAGOGASTRODUODENOSCOPY (EGD);  Surgeon: Clarene Essex, MD;  Location: [facility] ENDOSCOPY;  Service:  Endoscopy;  Laterality: N/A;  . GASTROSTOMY TUBE PLACEMENT  11/21/2015   REDUCTION OF HIATAL HERNIA , REPAIR HIATAL HERNIA, RESECTION SMALL BOWEL WITH ANASTOMOSIS, PLACEMENT GASTROSTOMY TUBE, PLACEMENT DUODENOSTOMY TUBE (N/A)  . HEMORRHOID BANDING  X 2  . HERNIA REPAIR    . HIATAL HERNIA REPAIR N/A 11/21/2015   Procedure: REDUCTION OF HIATAL HERNIA , REPAIR HIATAL HERNIA, RESECTION SMALL BOWEL WITH ANASTOMOSIS, PLACEMENT GASTROSTOMY TUBE, PLACEMENT DUODENOSTOMY TUBE;  Surgeon: Mickeal Skinner, MD;  Location: [place];  Service: General;  Laterality: N/A;  . [place]  06/28/2016   open/notes 07/15/2016  . IR GUIDED DRAIN W CATHETER PLACEMENT  07/06/2016   /NOTES 07/15/2016  . IR RADIOLOGIST EVAL & MGMT  07/27/2016  . IR RADIOLOGIST EVAL & MGMT  08/17/2016  . IR RADIOLOGIST EVAL & MGMT  08/26/2016  . IR [place] TUBE PERCUT W/FLUORO  08/04/2016  . IR US GUIDE BX ASP/DRAIN  08/04/2016  . KNEE CARTILAGE SURGERY Right 1971   "opened me up"  . LAPAROTOMY N/A 07/05/2015   Procedure: PARTIAL SIGMOID COLECTOMY AND COLOSTOMY;  Surgeon: [name], MD;  Location: [place];  Service: General;  Laterality: N/A;  . MELANOMA EXCISION  2001  . 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  10/30/2015   Procedure: REMOVAL OF DISTAL STOMACH;  Surgeon: Judeth Horn, MD;  Location: [place];  Service: General;;  . REPAIR OF PERFORATED ULCER N/A 10/30/2015   Procedure: REPAIR OF BLEEDING  ULCER;  Surgeon: Jeneen Rinks  Hulen Skains, MD;  Location: [place];  Service: General;  Laterality: N/A;  . TUMOR EXCISION  2009   "back; fatty tumor"    There were no vitals filed for this visit.      Subjective Assessment - 11/11/16 1340    Subjective Patient reports the pain today is about a 5/10 and is more diffuse across his lower back.    Limitations Lifting;Standing;Walking;House hold activities   How long can you sit comfortably? Not sure   How long can you stand comfortably? 5-10 min   How long can  you walk comfortably? 1/4 mile   Diagnostic tests x ray: OA   Currently in Pain? Yes   Pain Score 5    Pain Location Back   Pain Orientation Mid;Posterior   Pain Descriptors / Indicators Aching;Spasm   Pain Type Chronic pain   Pain Onset More than a month ago   Pain Frequency Constant   Aggravating Factors  activity on the feet    Pain Relieving Factors lying down, meds, heat    Multiple Pain Sites No                         [facility] Adult PT Treatment/Exercise - 11/11/16 0001      Lumbar Exercises: Standing   Other Standing Lumbar Exercises shoulder extension yellow 2x10; scpaular retraction 2x10;    Other Standing Lumbar Exercises cat stretch 5x 5 sec hold at the wall      Knee/Hip Exercises: Aerobic   Nustep L4 6 min UE and LE     Shoulder Exercises: Stretch   Other Shoulder Stretches posterior capsule stretch 2x20 sec; thoracic side bend 3x20 seconds each side; sink stretch 3x15 seconbd holds;      Theme park manager mid back   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[employer]    Electrical Stimulation Parameters L15    Electrical Stimulation Goals Pain     Manual Therapy   Manual Therapy Joint mobilization;Soft tissue mobilization;Manual Traction   Joint Mobilization PA Gr 3-4 T2 to L1 central and LT  then reached bilat up wall with deep breathing 2 sets of 3 breaths   Manual Traction --                PT Education - 11/11/16 1343    Education provided Yes   Education Details continue to work    [employer]) Educated Patient   Methods Explanation;Demonstration;Tactile cues;Handout   Comprehension Verbalized understanding;Returned demonstration          PT Short Term Goals - 11/11/16 1603      PT SHORT TERM GOAL #1   Title Pt will be indepenent in HEP as it has been established    Time 2   Period Weeks   Status On-going     PT SHORT TERM GOAL #2   Title Pt will verbalize improvement in postural awareness with  functional activities   Time 3   Status On-going     PT SHORT TERM GOAL #3   Title Pt will be able to sit for at least 20 min pain <=4/10   Time 3   Period Weeks   Status On-going           PT Long Term Goals - 11/03/16 1237      PT LONG TERM GOAL #1   Title He will be independent with all HEp issued    Time 6   Period Weeks  Status New     PT LONG TERM GOAL #2   Title His back pain will become intermittant with activity on feet   Time 6   Period Weeks   Status New     PT LONG TERM GOAL #3   Title He will be able to be on feet for 30-45 min before pain starts   Time 6   Period Weeks   Status New     PT LONG TERM GOAL #4   Title He will be able to sit with no pain for 30 min   Time 6   Period Weeks   Status New     PT LONG TERM GOAL #5   Title FOTO score will improve to < 20% limited   Time 6   Period Weeks   Status New               Plan - 11/11/16 1600    Clinical Impression Statement Patient tolerated treatment well. Therapy reviewed stretches with the patient. He was advised to keep the ones that helped and get rid of the ones that dont. He was also given posturla exercises. He had no increase in pain with treatment. He had spasming of his paraspinals all the way down to his lumbar spine. Continue to work on Arts administrator.    Clinical Presentation Unstable   Clinical Decision Making Moderate   Rehab Potential Good   PT Frequency 2x / week   PT Duration 6 weeks   PT Treatment/Interventions Electrical Stimulation;Iontophoresis 4mg /ml Dexamethasone;Moist Heat;Ultrasound;Patient/family education;Passive range of motion;Manual techniques;Therapeutic exercise;Therapeutic activities;Dry needling;Taping   PT Next Visit Plan Core strength for HEP , manual for hip and spine ROM , DN if available  Add wall reaching with deep breath next visit; reviewe tolerance to exercises. consoider TPDN to thoracic spine and lumbar spine.    PT Home Exercise Plan  posture awareness, stretching of spine, arm opening, decompression; thoracic side bend; posterior capsule; sink stretch, scpa retraction; shoulder extension;    Consulted and Agree with Plan of Care Patient      Patient will benefit from skilled therapeutic intervention in order to improve the following deficits and impairments:  Pain, Decreased activity tolerance, Postural dysfunction, Decreased strength, Decreased range of motion, Difficulty walking, Increased muscle spasms  Visit Diagnosis: Pain in thoracic spine  Muscle spasm of back  Muscle weakness (generalized)  Abnormal posture     Problem List Patient Active Problem List   Diagnosis Date Noted  . Malnutrition of moderate degree 07/19/2016  . Intra-abdominal abscess ([place]) 07/16/2016  . Cellulitis 07/15/2016  . S/P colostomy takedown 06/28/2016  . Pain in thoracic spine 06/09/2016  . Mid back pain 06/09/2016  . Postoperative fever 11/19/2015  . 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 Severe protein-calorie malnutrition ([place]) 11/17/2015  . Sepsis ([place]) 11/14/2015  . Hiccups 11/14/2015  . AKI (acute kidney injury) ([place]) 11/14/2015  . Fever   . Leg swelling   . Left shoulder pain   . Muscle spasm of left shoulder   . GI bleed 10/27/2015  . Acute blood loss anemia 10/27/2015  . Syncope 10/27/2015  . Hyperglycemia 10/27/2015  . Hypotension 10/27/2015  . Neck pain 10/27/2015  . Hematemesis 10/27/2015  . Hematochezia 10/27/2015  . Diverticulitis of colon with perforation 07/05/2015    Carney Living PT DPT  11/11/2016, 4:09 PM  [facility] [address] [place], [name], [address] Phone: [phone]   Fax:  [phone]  Name: Miguel [name] MRN: [id number] Date of Birth: [birth year]

## 2016-11-15 ENCOUNTER — Ambulatory Visit (INDEPENDENT_AMBULATORY_CARE_PROVIDER_SITE_OTHER): Payer: 59 | Admitting: Orthopaedic Surgery

## 2016-11-16 ENCOUNTER — Ambulatory Visit: Payer: 59 | Admitting: Physical Therapy

## 2016-11-16 DIAGNOSIS — M62830 Muscle spasm of back: Secondary | ICD-10-CM | POA: Diagnosis not present

## 2016-11-16 DIAGNOSIS — R293 Abnormal posture: Secondary | ICD-10-CM | POA: Diagnosis not present

## 2016-11-16 DIAGNOSIS — M6281 Muscle weakness (generalized): Secondary | ICD-10-CM

## 2016-11-16 DIAGNOSIS — M546 Pain in thoracic spine: Secondary | ICD-10-CM | POA: Diagnosis not present

## 2016-11-16 NOTE — Therapy (Signed)
[place], [name], [address] Phone: [phone]   Fax:  [phone]  Physical Therapy Treatment  Patient Details  Name: Miguel [name] MRN: [id number] Date of Birth: [birth year] Referring Provider: Jean Rosenthal, MD  Encounter Date: 11/16/2016      PT End of Session - 11/16/16 1444    Visit Number 4   Number of Visits 12   Date for PT Re-Evaluation 12/17/16   Authorization Type UMR / Medicare   PT Start Time 0932   PT Stop Time 1031   PT Time Calculation (min) 59 min   Activity Tolerance Patient tolerated treatment well   Behavior During Therapy [facility] for tasks assessed/performed      Past Medical History:  Diagnosis Date  . Anemia   . Anxiety   . BPH (benign prostatic hyperplasia)   . Gastritis   . GERD (gastroesophageal reflux disease)    "seldom" (07/16/2016)  . GI bleed due to NSAIDs 10/27/2015  . History of blood transfusion 06/2016   post OR/notes 07/15/2016  . History of hiatal hernia   . History of kidney stones   . Hyperlipidemia   . Hypertension   . Melanoma of back ([place])    "mid back"  . Sigmoid diverticulitis    with perforation    Past Surgical History:  Procedure Laterality Date  . COLON SURGERY     sigmoid  . COLOSTOMY TAKEDOWN N/A 06/28/2016   Procedure: COLOSTOMY TAKEDOWN;  Surgeon: [name], MD;  Location: [place];  Service: General;  Laterality: N/A;  . ESOPHAGOGASTRODUODENOSCOPY N/A 10/27/2015   Procedure: ESOPHAGOGASTRODUODENOSCOPY (EGD);  Surgeon: Clarene Essex, MD;  Location: [facility] ENDOSCOPY;  Service: Endoscopy;  Laterality: N/A;  . ESOPHAGOGASTRODUODENOSCOPY N/A 10/29/2015   Procedure: ESOPHAGOGASTRODUODENOSCOPY (EGD);  Surgeon: Ronald Lobo, MD;  Location: [facility] ENDOSCOPY;  Service: Endoscopy;  Laterality: N/A;  . ESOPHAGOGASTRODUODENOSCOPY N/A 10/30/2015   Procedure: ESOPHAGOGASTRODUODENOSCOPY (EGD);  Surgeon: Clarene Essex, MD;  Location: [facility] ENDOSCOPY;  Service:  Endoscopy;  Laterality: N/A;  . GASTROSTOMY TUBE PLACEMENT  11/21/2015   REDUCTION OF HIATAL HERNIA , REPAIR HIATAL HERNIA, RESECTION SMALL BOWEL WITH ANASTOMOSIS, PLACEMENT GASTROSTOMY TUBE, PLACEMENT DUODENOSTOMY TUBE (N/A)  . HEMORRHOID BANDING  X 2  . HERNIA REPAIR    . HIATAL HERNIA REPAIR N/A 11/21/2015   Procedure: REDUCTION OF HIATAL HERNIA , REPAIR HIATAL HERNIA, RESECTION SMALL BOWEL WITH ANASTOMOSIS, PLACEMENT GASTROSTOMY TUBE, PLACEMENT DUODENOSTOMY TUBE;  Surgeon: Mickeal Skinner, MD;  Location: [place];  Service: General;  Laterality: N/A;  . [place]  06/28/2016   open/notes 07/15/2016  . IR GUIDED DRAIN W CATHETER PLACEMENT  07/06/2016   /NOTES 07/15/2016  . IR RADIOLOGIST EVAL & MGMT  07/27/2016  . IR RADIOLOGIST EVAL & MGMT  08/17/2016  . IR RADIOLOGIST EVAL & MGMT  08/26/2016  . IR [place] TUBE PERCUT W/FLUORO  08/04/2016  . IR US GUIDE BX ASP/DRAIN  08/04/2016  . KNEE CARTILAGE SURGERY Right 1971   "opened me up"  . LAPAROTOMY N/A 07/05/2015   Procedure: PARTIAL SIGMOID COLECTOMY AND COLOSTOMY;  Surgeon: [name], MD;  Location: [place];  Service: General;  Laterality: N/A;  . MELANOMA EXCISION  2001  . 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  10/30/2015   Procedure: REMOVAL OF DISTAL STOMACH;  Surgeon: Judeth Horn, MD;  Location: [place];  Service: General;;  . REPAIR OF PERFORATED ULCER N/A 10/30/2015   Procedure: REPAIR OF BLEEDING  ULCER;  Surgeon: Jeneen Rinks  Hulen Skains, MD;  Location: [place];  Service: General;  Laterality: N/A;  . TUMOR EXCISION  2009   "back; fatty tumor"    There were no vitals filed for this visit.      Subjective Assessment - 11/16/16 1440    Subjective Patient continues to have 5/10 pain that runs in a band acrossed his lower back. It has not changed since his last visit.    Limitations Lifting;Standing;Walking;House hold activities   How long can you sit comfortably? Not sure   How long can you stand  comfortably? 5-10 min   How long can you walk comfortably? 1/4 mile   Diagnostic tests x ray: OA   Currently in Pain? Yes   Pain Score 5    Pain Location Back   Pain Orientation Mid;Posterior   Pain Descriptors / Indicators Aching;Spasm   Pain Type Chronic pain   Pain Onset More than a month ago   Pain Frequency Constant   Aggravating Factors  actvity standing    Pain Relieving Factors lying down    Effect of Pain on Daily Activities difficulty perfroming daily activity                          [facility] Adult PT Treatment/Exercise - 11/16/16 0001      Lumbar Exercises: Standing   Other Standing Lumbar Exercises shoulder extension red 2x10; scpaular retraction 2x10;    Other Standing Lumbar Exercises self toracic stretch      Modalities   Modalities Electrical Stimulation;Moist Heat     Moist Heat Therapy   Number Minutes Moist Heat 15 Minutes   Moist Heat Location Lumbar Spine     Electrical Stimulation   Electrical Stimulation Location mid back   Electrical Stimulation Action IFC    Electrical Stimulation Parameters L15    Electrical Stimulation Goals Pain     Manual Therapy   Manual Therapy Joint mobilization;Soft tissue mobilization;Manual Traction   Joint Mobilization PA Gr 2-3 T2 to L1 central and LT  then reached bilat up wall with deep breathing 2 sets of 3 breaths   Soft tissue mobilization IASTYM thoricac paraspinals and lower scapualr muslces to reduce inflammation.           Trigger Point Dry Needling - 11/16/16 1440    Consent Given? Yes   Education Handout Provided Yes   Muscles Treated Upper Body --  t-6 t-7 t-8 on left t-6 t-7 on right good twitch               PT Education - 11/16/16 1443    Education provided Yes   Education Details reviewed light exercises. Potential side effects of TPDN including pnemothorx symtoms and regional soreness    Person(s) Educated Patient   Methods Explanation;Demonstration;Tactile cues;Handout    Comprehension Verbalized understanding;Returned demonstration          PT Short Term Goals - 11/11/16 1603      PT SHORT TERM GOAL #1   Title Pt will be indepenent in HEP as it has been established    Time 2   Period Weeks   Status On-going     PT SHORT TERM GOAL #2   Title Pt will verbalize improvement in postural awareness with functional activities   Time 3   Status On-going     PT SHORT TERM GOAL #3   Title Pt will be able to sit for at least 20 min pain <=4/10   Time  3   Period Weeks   Status On-going           PT Long Term Goals - 11/03/16 1237      PT LONG TERM GOAL #1   Title He will be independent with all HEp issued    Time 6   Period Weeks   Status New     PT LONG TERM GOAL #2   Title His back pain will become intermittant with activity on feet   Time 6   Period Weeks   Status New     PT LONG TERM GOAL #3   Title He will be able to be on feet for 30-45 min before pain starts   Time 6   Period Weeks   Status New     PT LONG TERM GOAL #4   Title He will be able to sit with no pain for 30 min   Time 6   Period Weeks   Status New     PT LONG TERM GOAL #5   Title FOTO score will improve to < 20% limited   Time 6   Period Weeks   Status New               Plan - 11/16/16 1445    Clinical Impression Statement Patient reported improved stiffnessafter treatment. He report less pain. He tolerated trigger point dry needling well. Therapy perfrom 3 needles on the left thoracic paraspinals and 2 on the right. [place] has more spasming on the left side. Therapy endouraged patient to perfrom light stretching and exercises tomorrow to reduce post needle soreness.    Clinical Presentation Unstable   Clinical Decision Making Moderate   Rehab Potential Good   PT Frequency 2x / week   PT Duration 6 weeks   PT Treatment/Interventions Electrical Stimulation;Iontophoresis 4mg /ml Dexamethasone;Moist Heat;Ultrasound;Patient/family education;Passive  range of motion;Manual techniques;Therapeutic exercise;Therapeutic activities;Dry needling;Taping   PT Next Visit Plan Core strength for HEP , manual for hip and spine ROM , DN if available  Add wall reaching with deep breath next visit; reviewe tolerance to exercises. consoider TPDN to thoracic spine and lumbar spine.    PT Home Exercise Plan posture awareness, stretching of spine, arm opening, decompression; thoracic side bend; posterior capsule; sink stretch, scpa retraction; shoulder extension;    Consulted and Agree with Plan of Care Patient      Patient will benefit from skilled therapeutic intervention in order to improve the following deficits and impairments:  Pain, Decreased activity tolerance, Postural dysfunction, Decreased strength, Decreased range of motion, Difficulty walking, Increased muscle spasms  Visit Diagnosis: Pain in thoracic spine  Muscle spasm of back  Muscle weakness (generalized)  Abnormal posture     Problem List Patient Active Problem List   Diagnosis Date Noted  . Malnutrition of moderate degree 07/19/2016  . Intra-abdominal abscess ([place]) 07/16/2016  . Cellulitis 07/15/2016  . S/P colostomy takedown 06/28/2016  . Pain in thoracic spine 06/09/2016  . Mid back pain 06/09/2016  . Postoperative fever 11/19/2015  . 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 Severe protein-calorie malnutrition ([place]) 11/17/2015  . Sepsis ([place]) 11/14/2015  . Hiccups 11/14/2015  . AKI (acute kidney injury) ([place]) 11/14/2015  . Fever   . Leg swelling   . Left shoulder pain   . Muscle spasm of left shoulder   . GI bleed 10/27/2015  . Acute blood loss anemia 10/27/2015  . Syncope 10/27/2015  . Hyperglycemia 10/27/2015  . Hypotension 10/27/2015  . Neck pain 10/27/2015  .  Hematemesis 10/27/2015  . Hematochezia 10/27/2015  . Diverticulitis of colon with perforation 07/05/2015    Carney Living PT DPT  11/16/2016, 2:49 PM  [facility] [address] [place], [name], [address] Phone: [phone]   Fax:  [phone]  Name: Miguel [name] MRN: [id number] Date of Birth: 1950-12-09

## 2016-11-18 ENCOUNTER — Encounter: Payer: Self-pay | Admitting: Physical Therapy

## 2016-11-18 ENCOUNTER — Ambulatory Visit: Payer: 59 | Admitting: Physical Therapy

## 2016-11-18 DIAGNOSIS — M62830 Muscle spasm of back: Secondary | ICD-10-CM

## 2016-11-18 DIAGNOSIS — M546 Pain in thoracic spine: Secondary | ICD-10-CM | POA: Diagnosis not present

## 2016-11-18 DIAGNOSIS — R293 Abnormal posture: Secondary | ICD-10-CM

## 2016-11-18 DIAGNOSIS — M6281 Muscle weakness (generalized): Secondary | ICD-10-CM

## 2016-11-19 NOTE — Therapy (Signed)
[place], [name], [address] Phone: [phone]   Fax:  [phone]  Physical Therapy Treatment  Patient Details  Name: Miguel [name] MRN: [id number] Date of Birth: [birth year] Referring Provider: Jean Rosenthal, MD  Encounter Date: 11/18/2016      PT End of Session - 11/18/16 1319    Visit Number 5   Number of Visits 12   Date for PT Re-Evaluation 12/17/16   Authorization Type UMR / Medicare   PT Start Time 1145   PT Stop Time 1226   PT Time Calculation (min) 41 min   Activity Tolerance Patient tolerated treatment well   Behavior During Therapy [facility] for tasks assessed/performed      Past Medical History:  Diagnosis Date  . Anemia   . Anxiety   . BPH (benign prostatic hyperplasia)   . Gastritis   . GERD (gastroesophageal reflux disease)    "seldom" (07/16/2016)  . GI bleed due to NSAIDs 10/27/2015  . History of blood transfusion 06/2016   post OR/notes 07/15/2016  . History of hiatal hernia   . History of kidney stones   . Hyperlipidemia   . Hypertension   . Melanoma of back ([place])    "mid back"  . Sigmoid diverticulitis    with perforation    Past Surgical History:  Procedure Laterality Date  . COLON SURGERY     sigmoid  . COLOSTOMY TAKEDOWN N/A 06/28/2016   Procedure: COLOSTOMY TAKEDOWN;  Surgeon: [name], MD;  Location: [place];  Service: General;  Laterality: N/A;  . ESOPHAGOGASTRODUODENOSCOPY N/A 10/27/2015   Procedure: ESOPHAGOGASTRODUODENOSCOPY (EGD);  Surgeon: Clarene Essex, MD;  Location: [facility] ENDOSCOPY;  Service: Endoscopy;  Laterality: N/A;  . ESOPHAGOGASTRODUODENOSCOPY N/A 10/29/2015   Procedure: ESOPHAGOGASTRODUODENOSCOPY (EGD);  Surgeon: Ronald Lobo, MD;  Location: [facility] ENDOSCOPY;  Service: Endoscopy;  Laterality: N/A;  . ESOPHAGOGASTRODUODENOSCOPY N/A 10/30/2015   Procedure: ESOPHAGOGASTRODUODENOSCOPY (EGD);  Surgeon: Clarene Essex, MD;  Location: [facility] ENDOSCOPY;  Service:  Endoscopy;  Laterality: N/A;  . GASTROSTOMY TUBE PLACEMENT  11/21/2015   REDUCTION OF HIATAL HERNIA , REPAIR HIATAL HERNIA, RESECTION SMALL BOWEL WITH ANASTOMOSIS, PLACEMENT GASTROSTOMY TUBE, PLACEMENT DUODENOSTOMY TUBE (N/A)  . HEMORRHOID BANDING  X 2  . HERNIA REPAIR    . HIATAL HERNIA REPAIR N/A 11/21/2015   Procedure: REDUCTION OF HIATAL HERNIA , REPAIR HIATAL HERNIA, RESECTION SMALL BOWEL WITH ANASTOMOSIS, PLACEMENT GASTROSTOMY TUBE, PLACEMENT DUODENOSTOMY TUBE;  Surgeon: Mickeal Skinner, MD;  Location: [place];  Service: General;  Laterality: N/A;  . [place]  06/28/2016   open/notes 07/15/2016  . IR GUIDED DRAIN W CATHETER PLACEMENT  07/06/2016   /NOTES 07/15/2016  . IR RADIOLOGIST EVAL & MGMT  07/27/2016  . IR RADIOLOGIST EVAL & MGMT  08/17/2016  . IR RADIOLOGIST EVAL & MGMT  08/26/2016  . IR [place] TUBE PERCUT W/FLUORO  08/04/2016  . IR US GUIDE BX ASP/DRAIN  08/04/2016  . KNEE CARTILAGE SURGERY Right 1971   "opened me up"  . LAPAROTOMY N/A 07/05/2015   Procedure: PARTIAL SIGMOID COLECTOMY AND COLOSTOMY;  Surgeon: [name], MD;  Location: [place];  Service: General;  Laterality: N/A;  . MELANOMA EXCISION  2001  . 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 REPAIR OF PERFORATED ULCER N/A 10/30/2015   Procedure: REPAIR OF BLEEDING  ULCER;  Surgeon: Jeneen Rinks  Hulen Skains, MD;  Location: [place];  Service: General;  Laterality: N/A;  . TUMOR EXCISION  2009   "back; fatty tumor"    There were no vitals filed for this visit.      Subjective Assessment - 11/18/16 1152    Subjective Patient felt good after the treatment the other day but was sore the next day. He is feeling better today but his pain is at about its baseline.    Limitations Lifting;Standing;Walking;House hold activities   How long can you sit comfortably? Not sure   How long  can you stand comfortably? 5-10 min   How long can you walk comfortably? 1/4 mile   Diagnostic tests x ray: OA   Patient Stated Goals Get releif from pain   Currently in Pain? Yes   Pain Score (P)  4    Pain Location (P)  Back   Pain Orientation (P)  Posterior;Mid   Pain Descriptors / Indicators (P)  Aching;Spasm   Pain Type (P)  Chronic pain   Pain Onset (P)  More than a month ago   Pain Frequency (P)  Constant   Aggravating Factors  (P)  activity, standing                          [facility] Adult PT Treatment/Exercise - 11/19/16 0001      Lumbar Exercises: Standing   Other Standing Lumbar Exercises shoulder extension red 2x10; scpaular retraction 2x10;    Other Standing Lumbar Exercises self toracic stretch; seated thoracic ball stretch 5x10 sec hold      Knee/Hip Exercises: Aerobic   Other Aerobic UBE backwards 3 min      Modalities   Modalities Electrical Stimulation;Moist Heat     Moist Heat Therapy   Moist Heat Location Lumbar Spine     Electrical Stimulation   Electrical Stimulation Location mid back   Electrical Stimulation Action IFC    Electrical Stimulation Parameters L13    Electrical Stimulation Goals Pain     Manual Therapy   Manual Therapy Joint mobilization;Soft tissue mobilization;Manual Traction   Joint Mobilization Grade II and II PA glides in prone with illow to T3-T7'; Patient reportied improved pain after and feeling looser.    Soft tissue mobilization IASTYM thoricac paraspinals and lower scapualr muslces to reduce inflammation.           Trigger Point Dry Needling - 11/19/16 1216    Consent Given? Yes   Education Handout Provided Yes   Muscles Treated Upper Body --  T-spine paraspianls on the left 2 spots good twitch               PT Education - 11/18/16 1315    Education provided Yes   Education Details reviewed exercises and benefits of trigger point dry needling.    Person(s) Educated Patient   Methods  Explanation;Demonstration;Tactile cues;Handout   Comprehension Verbalized understanding;Returned demonstration          PT Short Term Goals - 11/11/16 1603      PT SHORT TERM GOAL #1   Title Pt will be indepenent in HEP as it has been established    Time 2   Period Weeks   Status On-going     PT SHORT TERM GOAL #2   Title Pt will verbalize improvement in postural awareness with functional activities   Time 3   Status On-going     PT SHORT TERM GOAL #3   Title Pt  will be able to sit for at least 20 min pain <=4/10   Time 3   Period Weeks   Status On-going           PT Long Term Goals - 11/03/16 1237      PT LONG TERM GOAL #1   Title He will be independent with all HEp issued    Time 6   Period Weeks   Status New     PT LONG TERM GOAL #2   Title His back pain will become intermittant with activity on feet   Time 6   Period Weeks   Status New     PT LONG TERM GOAL #3   Title He will be able to be on feet for 30-45 min before pain starts   Time 6   Period Weeks   Status New     PT LONG TERM GOAL #4   Title He will be able to sit with no pain for 30 min   Time 6   Period Weeks   Status New     PT LONG TERM GOAL #5   Title FOTO score will improve to < 20% limited   Time 6   Period Weeks   Status New             Patient will benefit from skilled therapeutic intervention in order to improve the following deficits and impairments:     Visit Diagnosis: Pain in thoracic spine  Muscle spasm of back  Muscle weakness (generalized)  Abnormal posture     Problem List Patient Active Problem List   Diagnosis Date Noted  . Malnutrition of moderate degree 07/19/2016  . Intra-abdominal abscess ([place]) 07/16/2016  . Cellulitis 07/15/2016  . S/P colostomy takedown 06/28/2016  . Pain in thoracic spine 06/09/2016  . Mid back pain 06/09/2016  . Postoperative fever 11/19/2015  . 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 Severe protein-calorie  malnutrition ([place]) 11/17/2015  . Sepsis ([place]) 11/14/2015  . Hiccups 11/14/2015  . AKI (acute kidney injury) ([place]) 11/14/2015  . Fever   . Leg swelling   . Left shoulder pain   . Muscle spasm of left shoulder   . GI bleed 10/27/2015  . Acute blood loss anemia 10/27/2015  . Syncope 10/27/2015  . Hyperglycemia 10/27/2015  . Hypotension 10/27/2015  . Neck pain 10/27/2015  . Hematemesis 10/27/2015  . Hematochezia 10/27/2015  . Diverticulitis of colon with perforation 07/05/2015    Carney Living PT DPT  11/19/2016, 12:20 PM  [facility] [address] [place], [name], [address] Phone: [phone]   Fax:  [phone]  Name: Miguel [name] MRN: [id number] Date of Birth: 10/12/1950

## 2016-11-23 ENCOUNTER — Ambulatory Visit: Payer: 59 | Admitting: Physical Therapy

## 2016-11-23 DIAGNOSIS — M546 Pain in thoracic spine: Secondary | ICD-10-CM

## 2016-11-23 DIAGNOSIS — M6281 Muscle weakness (generalized): Secondary | ICD-10-CM

## 2016-11-23 DIAGNOSIS — R293 Abnormal posture: Secondary | ICD-10-CM | POA: Diagnosis not present

## 2016-11-23 DIAGNOSIS — M62830 Muscle spasm of back: Secondary | ICD-10-CM

## 2016-11-23 NOTE — Therapy (Signed)
[place], [name], [address] Phone: [phone]   Fax:  [phone]  Physical Therapy Treatment  Patient Details  Name: Miguel [name] MRN: [id number] Date of Birth: 1951/03/03 Referring Provider: Jean Rosenthal, MD  Encounter Date: 11/23/2016       PT End of Session - 11/23/16 2130    Visit Number 6   Number of Visits 12   Date for PT Re-Evaluation 12/17/16   Authorization Type UMR / Medicare   PT Start Time 1330   PT Stop Time 1417   PT Time Calculation (min) 47 min   Activity Tolerance Patient tolerated treatment well   Behavior During Therapy [facility] for tasks assessed/performed      Past Medical History:  Diagnosis Date  . Anemia   . Anxiety   . BPH (benign prostatic hyperplasia)   . Gastritis   . GERD (gastroesophageal reflux disease)    "seldom" (07/16/2016)  . GI bleed due to NSAIDs 10/27/2015  . History of blood transfusion 06/2016   post OR/notes 07/15/2016  . History of hiatal hernia   . History of kidney stones   . Hyperlipidemia   . Hypertension   . Melanoma of back ([place])    "mid back"  . Sigmoid diverticulitis    with perforation    Past Surgical History:  Procedure Laterality Date  . COLON SURGERY     sigmoid  . COLOSTOMY TAKEDOWN N/A 06/28/2016   Procedure: COLOSTOMY TAKEDOWN;  Surgeon: [name], MD;  Location: [place];  Service: General;  Laterality: N/A;  . ESOPHAGOGASTRODUODENOSCOPY N/A 10/27/2015   Procedure: ESOPHAGOGASTRODUODENOSCOPY (EGD);  Surgeon: Clarene Essex, MD;  Location: [facility] ENDOSCOPY;  Service: Endoscopy;  Laterality: N/A;  . ESOPHAGOGASTRODUODENOSCOPY N/A 10/29/2015   Procedure: ESOPHAGOGASTRODUODENOSCOPY (EGD);  Surgeon: Ronald Lobo, MD;  Location[facility] ENDOSCOPY;  Service: Endoscopy;  Laterality: N/A;  . ESOPHAGOGASTRODUODENOSCOPY N/A 10/30/2015   Procedure: ESOPHAGOGASTRODUODENOSCOPY (EGD);  Surgeon: Clarene Essex, MD;  Location: [facility] ENDOSCOPY;  Service:  Endoscopy;  Laterality: N/A;  . GASTROSTOMY TUBE PLACEMENT  11/21/2015   REDUCTION OF HIATAL HERNIA , REPAIR HIATAL HERNIA, RESECTION SMALL BOWEL WITH ANASTOMOSIS, PLACEMENT GASTROSTOMY TUBE, PLACEMENT DUODENOSTOMY TUBE (N/A)  . HEMORRHOID BANDING  X 2  . HERNIA REPAIR    . HIATAL HERNIA REPAIR N/A 11/21/2015   Procedure: REDUCTION OF HIATAL HERNIA , REPAIR HIATAL HERNIA, RESECTION SMALL BOWEL WITH ANASTOMOSIS, PLACEMENT GASTROSTOMY TUBE, PLACEMENT DUODENOSTOMY TUBE;  Surgeon: Mickeal Skinner, MD;  Location: [place];  Service: General;  Laterality: N/A;  . [place]  06/28/2016   open/notes 07/15/2016  . IR GUIDED DRAIN W CATHETER PLACEMENT  07/06/2016   /NOTES 07/15/2016  . IR RADIOLOGIST EVAL & MGMT  07/27/2016  . IR RADIOLOGIST EVAL & MGMT  08/17/2016  . IR RADIOLOGIST EVAL & MGMT  08/26/2016  . IR [place] TUBE PERCUT W/FLUORO  08/04/2016  . IR US GUIDE BX ASP/DRAIN  08/04/2016  . KNEE CARTILAGE SURGERY Right 1971   "opened me up"  . LAPAROTOMY N/A 07/05/2015   Procedure: PARTIAL SIGMOID COLECTOMY AND COLOSTOMY;  Surgeon: [name], MD;  Location: [place];  Service: General;  Laterality: N/A;  . MELANOMA EXCISION  2001  . 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  10/30/2015   Procedure: REMOVAL OF DISTAL STOMACH;  Surgeon: Judeth Horn, MD;  Location: [place];  Service: General;;  . REPAIR OF PERFORATED ULCER N/A 10/30/2015   Procedure: REPAIR OF BLEEDING  ULCER;  Surgeon:  Judeth Horn, MD;  Location: [place];  Service: General;  Laterality: N/A;  . TUMOR EXCISION  2009   "back; fatty tumor"    There were no vitals filed for this visit.      Subjective Assessment - 11/23/16 1335    Subjective Patient reports his pain Friday was the best it has been since he got out of the hospital.    Limitations Lifting;Standing;Walking;House hold activities   How long can you sit comfortably? Not sure   How long can you stand comfortably? 5-10 min   How long can  you walk comfortably? 1/4 mile   Diagnostic tests x ray: OA   Pain Score 5    Pain Location Back   Pain Orientation Mid;Posterior   Pain Descriptors / Indicators Aching;Spasm   Pain Type Chronic pain   Pain Onset More than a month ago   Pain Frequency Constant   Aggravating Factors  lifting items    Pain Relieving Factors lying down    Effect of Pain on Daily Activities difficulty perfroming daiuly activity                          [facility] Adult PT Treatment/Exercise - 11/23/16 0001      Lumbar Exercises: Standing   Other Standing Lumbar Exercises shoulder extension red 2x10; scpaular retraction 2x10;    Other Standing Lumbar Exercises self toracic stretch; seated thoracic ball stretch 5x10 sec hold      Knee/Hip Exercises: Aerobic   Other Aerobic UBE backwards 3 min      Modalities   Modalities Electrical Stimulation;Moist Heat     Moist Heat Therapy   Number Minutes Moist Heat 15 Minutes   Moist Heat Location Lumbar Spine     Electrical Stimulation   Electrical Stimulation Location mid back   Electrical Stimulation Action IFC    Electrical Stimulation Parameters L15   Electrical Stimulation Goals Pain     Manual Therapy   Manual Therapy Joint mobilization;Soft tissue mobilization;Manual Traction   Joint Mobilization Grade II and II PA glides in prone with illow to T3-T7'; Patient reportied improved pain after and feeling looser.    Soft tissue mobilization IASTYM thoricac paraspinals and lower scapualr muslces to reduce inflammation.                 PT Education - 11/23/16 2128    Education provided Yes   Education Details reviewed and advanced HEP    Person(s) Educated Patient   Methods Explanation;Demonstration;Tactile cues;Handout   Comprehension Verbalized understanding;Returned demonstration          PT Short Term Goals - 11/11/16 1603      PT SHORT TERM GOAL #1   Title Pt will be indepenent in HEP as it has been established     Time 2   Period Weeks   Status On-going     PT SHORT TERM GOAL #2   Title Pt will verbalize improvement in postural awareness with functional activities   Time 3   Status On-going     PT SHORT TERM GOAL #3   Title Pt will be able to sit for at least 20 min pain <=4/10   Time 3   Period Weeks   Status On-going           PT Long Term Goals - 11/03/16 1237      PT LONG TERM GOAL #1   Title He will be independent with all HEp  issued    Time 6   Period Weeks   Status New     PT LONG TERM GOAL #2   Title His back pain will become intermittant with activity on feet   Time 6   Period Weeks   Status New     PT LONG TERM GOAL #3   Title He will be able to be on feet for 30-45 min before pain starts   Time 6   Period Weeks   Status New     PT LONG TERM GOAL #4   Title He will be able to sit with no pain for 30 min   Time 6   Period Weeks   Status New     PT LONG TERM GOAL #5   Title FOTO score will improve to < 20% limited   Time 6   Period Weeks   Status New               Plan - 11/23/16 2131    Clinical Impression Statement Patient is making progress. He had minimal pain after treatment. He had minimal pain after the last treatment but then did some yardwork. he was advised to go slowly over the next few days and focus on his exercises. Therapy will continue with dry needling and mobilizations.    History and Personal Factors relevant to plan of care: multiple abdominal surgery   Clinical Presentation Unstable   Clinical Presentation due to: chronic pain    Clinical Decision Making Moderate   Rehab Potential Good   PT Frequency 2x / week   PT Duration 6 weeks   PT Treatment/Interventions Electrical Stimulation;Iontophoresis 4mg /ml Dexamethasone;Moist Heat;Ultrasound;Patient/family education;Passive range of motion;Manual techniques;Therapeutic exercise;Therapeutic activities;Dry needling;Taping   PT Next Visit Plan Core strength for HEP , manual for hip  and spine ROM , DN if available  Add wall reaching with deep breath next visit; reviewe tolerance to exercises. consoider TPDN to thoracic spine and lumbar spine.    PT Home Exercise Plan posture awareness, stretching of spine, arm opening, decompression; thoracic side bend; posterior capsule; sink stretch, scpa retraction; shoulder extension;    Consulted and Agree with Plan of Care Patient      Patient will benefit from skilled therapeutic intervention in order to improve the following deficits and impairments:  Pain, Decreased activity tolerance, Postural dysfunction, Decreased strength, Decreased range of motion, Difficulty walking, Increased muscle spasms  Visit Diagnosis: Pain in thoracic spine  Muscle spasm of back  Muscle weakness (generalized)  Abnormal posture     Problem List Patient Active Problem List   Diagnosis Date Noted  . Malnutrition of moderate degree 07/19/2016  . Intra-abdominal abscess ([place]) 07/16/2016  . Cellulitis 07/15/2016  . S/P colostomy takedown 06/28/2016  . Pain in thoracic spine 06/09/2016  . Mid back pain 06/09/2016  . Postoperative fever 11/19/2015  . 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 Severe protein-calorie malnutrition ([place]) 11/17/2015  . Sepsis ([place]) 11/14/2015  . Hiccups 11/14/2015  . AKI (acute kidney injury) ([place]) 11/14/2015  . Fever   . Leg swelling   . Left shoulder pain   . Muscle spasm of left shoulder   . GI bleed 10/27/2015  . Acute blood loss anemia 10/27/2015  . Syncope 10/27/2015  . Hyperglycemia 10/27/2015  . Hypotension 10/27/2015  . Neck pain 10/27/2015  . Hematemesis 10/27/2015  . Hematochezia 10/27/2015  . Diverticulitis of colon with perforation 07/05/2015    Carney Living  PT DPT  11/23/2016, 9:39 PM  [place], [name], [address] Phone: [phone]   Fax:  [phone]  Name: Miguel [name] MRN: [id number] Date of Birth:  [birth year]

## 2016-11-25 ENCOUNTER — Ambulatory Visit: Payer: 59 | Admitting: Physical Therapy

## 2016-11-25 DIAGNOSIS — M546 Pain in thoracic spine: Secondary | ICD-10-CM | POA: Diagnosis not present

## 2016-11-25 DIAGNOSIS — M62830 Muscle spasm of back: Secondary | ICD-10-CM | POA: Diagnosis not present

## 2016-11-25 DIAGNOSIS — R293 Abnormal posture: Secondary | ICD-10-CM

## 2016-11-25 DIAGNOSIS — M6281 Muscle weakness (generalized): Secondary | ICD-10-CM

## 2016-11-25 DIAGNOSIS — N179 Acute kidney failure, unspecified: Secondary | ICD-10-CM | POA: Diagnosis not present

## 2016-11-25 DIAGNOSIS — K5720 Diverticulitis of large intestine with perforation and abscess without bleeding: Secondary | ICD-10-CM | POA: Diagnosis not present

## 2016-11-25 NOTE — Therapy (Signed)
[place] [place], [name], [address] Phone: [phone]   Fax:  [phone]  Physical Therapy Treatment  Patient Details  Name: Miguel [name] MRN: [id number] Date of Birth: 1950-07-14 Referring Provider: Jean Rosenthal, MD  Encounter Date: 11/25/2016      PT End of Session - 11/25/16 2055    Visit Number 7   Number of Visits 12   Date for PT Re-Evaluation 12/17/16   Authorization Type UMR / Medicare   PT Start Time 1330   PT Stop Time 1428   PT Time Calculation (min) 58 min   Activity Tolerance Patient tolerated treatment well   Behavior During Therapy [facility] for tasks assessed/performed      Past Medical History:  Diagnosis Date  . Anemia   . Anxiety   . BPH (benign prostatic hyperplasia)   . Gastritis   . GERD (gastroesophageal reflux disease)    "seldom" (07/16/2016)  . GI bleed due to NSAIDs 10/27/2015  . History of blood transfusion 06/2016   post OR/notes 07/15/2016  . History of hiatal hernia   . History of kidney stones   . Hyperlipidemia   . Hypertension   . Melanoma of back ([place])    "mid back"  . Sigmoid diverticulitis    with perforation    Past Surgical History:  Procedure Laterality Date  . COLON SURGERY     sigmoid  . COLOSTOMY TAKEDOWN N/A 06/28/2016   Procedure: COLOSTOMY TAKEDOWN;  Surgeon: [name], MD;  Location: [place];  Service: General;  Laterality: N/A;  . ESOPHAGOGASTRODUODENOSCOPY N/A 10/27/2015   Procedure: ESOPHAGOGASTRODUODENOSCOPY (EGD);  Surgeon: Clarene Essex, MD;  Location: [facility] ENDOSCOPY;  Service: Endoscopy;  Laterality: N/A;  . ESOPHAGOGASTRODUODENOSCOPY N/A 10/29/2015   Procedure: ESOPHAGOGASTRODUODENOSCOPY (EGD);  Surgeon: Ronald Lobo, MD;  Location: [facility] ENDOSCOPY;  Service: Endoscopy;  Laterality: N/A;  . ESOPHAGOGASTRODUODENOSCOPY N/A 10/30/2015   Procedure: ESOPHAGOGASTRODUODENOSCOPY (EGD);  Surgeon: Clarene Essex, MD;  Location: [facility] ENDOSCOPY;  Service:  Endoscopy;  Laterality: N/A;  . GASTROSTOMY TUBE PLACEMENT  11/21/2015   REDUCTION OF HIATAL HERNIA , REPAIR HIATAL HERNIA, RESECTION SMALL BOWEL WITH ANASTOMOSIS, PLACEMENT GASTROSTOMY TUBE, PLACEMENT DUODENOSTOMY TUBE (N/A)  . HEMORRHOID BANDING  X 2  . HERNIA REPAIR    . HIATAL HERNIA REPAIR N/A 11/21/2015   Procedure: REDUCTION OF HIATAL HERNIA , REPAIR HIATAL HERNIA, RESECTION SMALL BOWEL WITH ANASTOMOSIS, PLACEMENT GASTROSTOMY TUBE, PLACEMENT DUODENOSTOMY TUBE;  Surgeon: Mickeal Skinner, MD;  Location: [place];  Service: General;  Laterality: N/A;  . [place]  06/28/2016   open/notes 07/15/2016  . IR GUIDED DRAIN W CATHETER PLACEMENT  07/06/2016   /NOTES 07/15/2016  . IR RADIOLOGIST EVAL & MGMT  07/27/2016  . IR RADIOLOGIST EVAL & MGMT  08/17/2016  . IR RADIOLOGIST EVAL & MGMT  08/26/2016  . IR [place] TUBE PERCUT W/FLUORO  08/04/2016  . IR US GUIDE BX ASP/DRAIN  08/04/2016  . KNEE CARTILAGE SURGERY Right 1971   "opened me up"  . LAPAROTOMY N/A 07/05/2015   Procedure: PARTIAL SIGMOID COLECTOMY AND COLOSTOMY;  Surgeon: [name], MD;  Location: [place];  Service: General;  Laterality: N/A;  . MELANOMA EXCISION  2001  . 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 REPAIR OF PERFORATED ULCER N/A 10/30/2015   Procedure: REPAIR OF BLEEDING  ULCER;  Surgeon: Jeneen Rinks  Hulen Skains, MD;  Location: [place];  Service: General;  Laterality: N/A;  . TUMOR EXCISION  2009   "back; fatty tumor"    There were no vitals filed for this visit.      Subjective Assessment - 11/25/16 2052    Subjective Patient reported just minor pain this morning. He has intermittent pain since last visit. Overall he feels like he is making progress. He took it easy over the past few days.    Limitations Lifting;Standing;Walking;House hold activities   How long can you sit  comfortably? Not sure   How long can you stand comfortably? 5-10 min   How long can you walk comfortably? 1/4 mile   Diagnostic tests x ray: OA   Patient Stated Goals Get releif from pain   Currently in Pain? Yes   Pain Score 2    Pain Location Back   Pain Orientation Mid;Posterior   Pain Descriptors / Indicators Aching;Spasm   Pain Type Chronic pain   Pain Onset More than a month ago   Pain Frequency Constant   Aggravating Factors  lifting items    Pain Relieving Factors lying down    Effect of Pain on Daily Activities difficulty perfroming daily activity                            Trigger Point Dry Needling - 11/25/16 2050    Consent Given? Yes   Education Handout Provided Yes   Muscles Treated Upper Body --  lower trap: good twitch; Throacic praspinals at T-6 and T-7               PT Education - 11/25/16 2055    Education provided Yes   Education Details reviewed HEP    Person(s) Educated Patient   Methods Explanation;Demonstration;Tactile cues;Handout   Comprehension Verbalized understanding;Returned demonstration          PT Short Term Goals - 11/25/16 2059      PT SHORT TERM GOAL #1   Title Pt will be indepenent in HEP as it has been established    Time 2   Period Weeks   Status Achieved     PT SHORT TERM GOAL #2   Title Pt will verbalize improvement in postural awareness with functional activities   Baseline is being more aware of his posture    Time 3   Period Weeks   Status Achieved     PT SHORT TERM GOAL #3   Title Pt will be able to sit for at least 20 min pain <=4/10   Time 3   Period Weeks   Status On-going           PT Long Term Goals - 11/03/16 1237      PT LONG TERM GOAL #1   Title He will be independent with all HEp issued    Time 6   Period Weeks   Status New     PT LONG TERM GOAL #2   Title His back pain will become intermittant with activity on feet   Time 6   Period Weeks   Status New     PT  LONG TERM GOAL #3   Title He will be able to be on feet for 30-45 min before pain starts   Time 6   Period Weeks   Status New     PT LONG TERM GOAL #4   Title He will be able to sit with  no pain for 30 min   Time 6   Period Weeks   Status New     PT LONG TERM GOAL #5   Title FOTO score will improve to < 20% limited   Time 6   Period Weeks   Status New               Plan - 11/25/16 2056    Clinical Impression Statement Patient tolerated needling well. He had a good twitch respose on the left T-7 and T-6 throacic paraspinals. He also had a good twitch in the lower trap. Moderate twitch felt in the lower lumbar spine. He is having some upper trap stiffness today. Therapy performed trigger point release in thsi area but not dry needling. Therapy continues to focus on manual therapy including throacic mobilizations. He feles like it is helping his overall mobility.    PT Next Visit Plan continue with TPDN; continue with manual therapy to the thoracic spine. Continue with posterior chain strengthening; consider advancement to green band when able.    PT Home Exercise Plan posture awareness, stretching of spine, arm opening, decompression; thoracic side bend; posterior capsule; sink stretch, scpa retraction; shoulder extension;    Consulted and Agree with Plan of Care Patient      Patient will benefit from skilled therapeutic intervention in order to improve the following deficits and impairments:     Visit Diagnosis: Pain in thoracic spine  Muscle spasm of back  Muscle weakness (generalized)  Abnormal posture     Problem List Patient Active Problem List   Diagnosis Date Noted  . Malnutrition of moderate degree 07/19/2016  . Intra-abdominal abscess ([place]) 07/16/2016  . Cellulitis 07/15/2016  . S/P colostomy takedown 06/28/2016  . Pain in thoracic spine 06/09/2016  . Mid back pain 06/09/2016  . Postoperative fever 11/19/2015  . 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  Severe protein-calorie malnutrition ([place]) 11/17/2015  . Sepsis ([place]) 11/14/2015  . Hiccups 11/14/2015  . AKI (acute kidney injury) ([place]) 11/14/2015  . Fever   . Leg swelling   . Left shoulder pain   . Muscle spasm of left shoulder   . GI bleed 10/27/2015  . Acute blood loss anemia 10/27/2015  . Syncope 10/27/2015  . Hyperglycemia 10/27/2015  . Hypotension 10/27/2015  . Neck pain 10/27/2015  . Hematemesis 10/27/2015  . Hematochezia 10/27/2015  . Diverticulitis of colon with perforation 07/05/2015    [place] Living PT DPT  11/25/2016, 9:00 PM  [place], [name], [address] Phone: [phone]   Fax:  [phone]  Name: BEATRICE SEHGAL MRN: [id number] Date of Birth: 06-19-1950

## 2016-11-26 MED FILL — TAMSULOSIN HCL 0.4 MG CAP: 0.4 | 30 days supply | Qty: 30 | Fill #6

## 2016-11-26 MED FILL — FAMOTIDINE 20 MG TABLET: 20 | 30 days supply | Qty: 120 | Fill #1

## 2016-11-30 ENCOUNTER — Ambulatory Visit: Payer: 59 | Attending: Family Medicine | Admitting: Physical Therapy

## 2016-11-30 ENCOUNTER — Encounter: Payer: Self-pay | Admitting: Physical Therapy

## 2016-11-30 DIAGNOSIS — R293 Abnormal posture: Secondary | ICD-10-CM | POA: Insufficient documentation

## 2016-11-30 DIAGNOSIS — M546 Pain in thoracic spine: Secondary | ICD-10-CM | POA: Insufficient documentation

## 2016-11-30 DIAGNOSIS — M6281 Muscle weakness (generalized): Secondary | ICD-10-CM | POA: Diagnosis not present

## 2016-11-30 DIAGNOSIS — M62830 Muscle spasm of back: Secondary | ICD-10-CM | POA: Diagnosis not present

## 2016-11-30 NOTE — Therapy (Signed)
[place], [name], [address] Phone: [phone]   Fax:  [phone]  Physical Therapy Treatment  Patient Details  Name: Miguel [name] MRN: [id number] Date of Birth: May 27, 1950 Referring Provider: Jean Rosenthal, MD  Encounter Date: 11/30/2016      PT End of Session - 11/30/16 1130    Visit Number 8   Number of Visits 12   Date for PT Re-Evaluation 12/17/16   PT Start Time 0737   PT Stop Time 1110   PT Time Calculation (min) 55 min   Activity Tolerance Patient tolerated treatment well   Behavior During Therapy [facility] for tasks assessed/performed      Past Medical History:  Diagnosis Date  . Anemia   . Anxiety   . BPH (benign prostatic hyperplasia)   . Gastritis   . GERD (gastroesophageal reflux disease)    "seldom" (07/16/2016)  . GI bleed due to NSAIDs 10/27/2015  . History of blood transfusion 06/2016   post OR/notes 07/15/2016  . History of hiatal hernia   . History of kidney stones   . Hyperlipidemia   . Hypertension   . Melanoma of back ([place])    "mid back"  . Sigmoid diverticulitis    with perforation    Past Surgical History:  Procedure Laterality Date  . COLON SURGERY     sigmoid  . COLOSTOMY TAKEDOWN N/A 06/28/2016   Procedure: COLOSTOMY TAKEDOWN;  Surgeon: [name], MD;  Location: [place];  Service: General;  Laterality: N/A;  . ESOPHAGOGASTRODUODENOSCOPY N/A 10/27/2015   Procedure: ESOPHAGOGASTRODUODENOSCOPY (EGD);  Surgeon: Clarene Essex, MD;  Location: [facility] ENDOSCOPY;  Service: Endoscopy;  Laterality: N/A;  . ESOPHAGOGASTRODUODENOSCOPY N/A 10/29/2015   Procedure: ESOPHAGOGASTRODUODENOSCOPY (EGD);  Surgeon: Ronald Lobo, MD;  Location: [facility] ENDOSCOPY;  Service: Endoscopy;  Laterality: N/A;  . ESOPHAGOGASTRODUODENOSCOPY N/A 10/30/2015   Procedure: ESOPHAGOGASTRODUODENOSCOPY (EGD);  Surgeon: Clarene Essex, MD;  Location: [facility] ENDOSCOPY;  Service: Endoscopy;  Laterality: N/A;  .  GASTROSTOMY TUBE PLACEMENT  11/21/2015   REDUCTION OF HIATAL HERNIA , REPAIR HIATAL HERNIA, RESECTION SMALL BOWEL WITH ANASTOMOSIS, PLACEMENT GASTROSTOMY TUBE, PLACEMENT DUODENOSTOMY TUBE (N/A)  . HEMORRHOID BANDING  X 2  . HERNIA REPAIR    . HIATAL HERNIA REPAIR N/A 11/21/2015   Procedure: REDUCTION OF HIATAL HERNIA , REPAIR HIATAL HERNIA, RESECTION SMALL BOWEL WITH ANASTOMOSIS, PLACEMENT GASTROSTOMY TUBE, PLACEMENT DUODENOSTOMY TUBE;  Surgeon: Mickeal Skinner, MD;  Location: [place];  Service: General;  Laterality: N/A;  . [place]  06/28/2016   open/notes 07/15/2016  . IR GUIDED DRAIN W CATHETER PLACEMENT  07/06/2016   /NOTES 07/15/2016  . IR RADIOLOGIST EVAL & MGMT  07/27/2016  . IR RADIOLOGIST EVAL & MGMT  08/17/2016  . IR RADIOLOGIST EVAL & MGMT  08/26/2016  . IR [place] TUBE PERCUT W/FLUORO  08/04/2016  . IR US GUIDE BX ASP/DRAIN  08/04/2016  . KNEE CARTILAGE SURGERY Right 1971   "opened me up"  . LAPAROTOMY N/A 07/05/2015   Procedure: PARTIAL SIGMOID COLECTOMY AND COLOSTOMY;  Surgeon: [name], MD;  Location: [place];  Service: General;  Laterality: N/A;  . MELANOMA EXCISION  2001  . 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  10/30/2015   Procedure: REMOVAL OF DISTAL STOMACH;  Surgeon: Judeth Horn, MD;  Location: [place];  Service: General;;  . REPAIR OF PERFORATED ULCER N/A 10/30/2015   Procedure: REPAIR OF BLEEDING  ULCER;  Surgeon: Judeth Horn, MD;  Location: [place];  Service: General;  Laterality: N/A;  . TUMOR EXCISION  2009   "back; fatty tumor"    There were no vitals filed for this visit.      Subjective Assessment - 11/30/16 1022    Subjective "I was pretty active over the weekend I think I stirred it up again" I feel like I am having more pain in th emiddle of the back today.   Currently in Pain? Yes   Pain Score 5    Pain Location Back   Pain Orientation Mid;Posterior   Pain Descriptors / Indicators Aching;Spasm   Pain Type  Chronic pain   Pain Onset More than a month ago   Pain Frequency Constant                         [facility] Adult PT Treatment/Exercise - 11/30/16 1041      Neck Exercises: Seated   Other Seated Exercise thoracic rotation 2 x 10   Other Seated Exercise thoracic extension 2 x 10 over back of chair with hands behind head and elbows adducted  cues to keep chin tucked     Lumbar Exercises: Stretches   Prone Mid Back Stretch 2 reps;30 seconds  performed in sitting reaching for shins.     Lumbar Exercises: Standing   Other Standing Lumbar Exercises self lumbar mobs using sheet 2 x 10 working up segments gradually     Acupuncturist Location mid back   Electrical Stimulation Action IFC   Electrical Stimulation Parameters L12 x 10 min, 100% scan    Electrical Stimulation Goals Pain     Manual Therapy   Joint Mobilization Grade II and II PA glides in prone with illow to T3-T7'; Patient reportied improved pain after and feeling looser.    Soft tissue mobilization IASTM Thoracolumbar paraspinals          Trigger Point Dry Needling - 11/30/16 1038    Consent Given? Yes   Education Handout Provided Yes  given previously   Muscles Treated Upper Body Longissimus   Longissimus Response Twitch response elicited;Palpable increased muscle length  bil thoracolumbar multifius at L5,L3,L1, T11,T9,T7,T5 bil              PT Education - 11/30/16 1129    Education provided Yes   Education Details updated HEP for thoracic mobility, and when doing chores to cut the chores in half to avoid over doing activity.    Person(s) Educated Patient   Methods Explanation;Verbal cues;Handout   Comprehension Verbalized understanding;Verbal cues required          PT Short Term Goals - 11/25/16 2059      PT SHORT TERM GOAL #1   Title Pt will be indepenent in HEP as it has been established    Time 2   Period Weeks   Status Achieved     PT SHORT  TERM GOAL #2   Title Pt will verbalize improvement in postural awareness with functional activities   Baseline is being more aware of his posture    Time 3   Period Weeks   Status Achieved     PT SHORT TERM GOAL #3   Title Pt will be able to sit for at least 20 min pain <=4/10   Time 3   Period Weeks   Status On-going           PT Long Term Goals - 11/03/16 1237  PT LONG TERM GOAL #1   Title He will be independent with all HEp issued    Time 6   Period Weeks   Status New     PT LONG TERM GOAL #2   Title His back pain will become intermittant with activity on feet   Time 6   Period Weeks   Status New     PT LONG TERM GOAL #3   Title He will be able to be on feet for 30-45 min before pain starts   Time 6   Period Weeks   Status New     PT LONG TERM GOAL #4   Title He will be able to sit with no pain for 30 min   Time 6   Period Weeks   Status New     PT LONG TERM GOAL #5   Title FOTO score will improve to < 20% limited   Time 6   Period Weeks   Status New               Plan - 11/30/16 1145    Clinical Impression Statement pt reported increased back soreness due to overdoing too much activity over the weekend. continued TPDN over bil thoracolumbar paraspinals. continued soft tissue work and thoracic mobility exercise which he reported relief of pain and tightness. continued e-stim and MHP post session for DN soreness.    PT Next Visit Plan continue with TPDN; continue with manual therapy to the thoracic spine. Continue with posterior chain strengthening; consider advancement to green band when able.    PT Home Exercise Plan posture awareness, stretching of spine, arm opening, decompression; thoracic side bend; posterior capsule; sink stretch, scpa retraction; shoulder extension; seated thoracic rotation, extension over chair.    Consulted and Agree with Plan of Care Patient      Patient will benefit from skilled therapeutic intervention in order to  improve the following deficits and impairments:  Pain, Decreased activity tolerance, Postural dysfunction, Decreased strength, Decreased range of motion, Difficulty walking, Increased muscle spasms  Visit Diagnosis: Pain in thoracic spine  Muscle spasm of back  Muscle weakness (generalized)  Abnormal posture     Problem List Patient Active Problem List   Diagnosis Date Noted  . Malnutrition of moderate degree 07/19/2016  . Intra-abdominal abscess ([place]) 07/16/2016  . Cellulitis 07/15/2016  . S/P colostomy takedown 06/28/2016  . Pain in thoracic spine 06/09/2016  . Mid back pain 06/09/2016  . Postoperative fever 11/19/2015  . 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 Severe protein-calorie malnutrition ([place]) 11/17/2015  . Sepsis ([place]) 11/14/2015  . Hiccups 11/14/2015  . AKI (acute kidney injury) ([place]) 11/14/2015  . Fever   . Leg swelling   . Left shoulder pain   . Muscle spasm of left shoulder   . GI bleed 10/27/2015  . Acute blood loss anemia 10/27/2015  . Syncope 10/27/2015  . Hyperglycemia 10/27/2015  . Hypotension 10/27/2015  . Neck pain 10/27/2015  . Hematemesis 10/27/2015  . Hematochezia 10/27/2015  . Diverticulitis of colon with perforation 07/05/2015   Starr [place] PT, DPT, LAT, ATC  11/30/16  11:56 AM      [facility] [address] [place], [name], [address] Phone: [phone]   Fax:  [phone]  Name: Miguel [name] MRN: [id number] Date of Birth: 07/29/1950

## 2016-12-02 ENCOUNTER — Ambulatory Visit: Payer: 59 | Admitting: Physical Therapy

## 2016-12-02 ENCOUNTER — Encounter: Payer: Self-pay | Admitting: Physical Therapy

## 2016-12-02 DIAGNOSIS — M546 Pain in thoracic spine: Secondary | ICD-10-CM | POA: Diagnosis not present

## 2016-12-02 DIAGNOSIS — M6281 Muscle weakness (generalized): Secondary | ICD-10-CM | POA: Diagnosis not present

## 2016-12-02 DIAGNOSIS — R293 Abnormal posture: Secondary | ICD-10-CM | POA: Diagnosis not present

## 2016-12-02 DIAGNOSIS — M62830 Muscle spasm of back: Secondary | ICD-10-CM | POA: Diagnosis not present

## 2016-12-02 NOTE — Patient Instructions (Signed)
Posture Tips DO: - stand tall and erect - keep chin tucked in - keep head and shoulders in alignment - check posture regularly in mirror or large window - pull head back against headrest in car seat;  Change your position often.  Sit with lumbar support. DON'T: - slouch or slump while watching TV or reading - sit, stand or lie in one position  for too long;  Sitting is especially hard on the spine so if you sit at a desk/use the computer, then stand up often!   Copyright  VHI. All rights reserved.  Posture - Standing   Good posture is important. Avoid slouching and forward head thrust. Maintain curve in low back and align ears over shoul- ders, hips over ankles.  Pull your belly button in toward your back bone. Stand with even weight in feet.  Tail bone anchored down. Lengthening spine.taking deep breathes. Soft knees, belly button to spine, ribs lifted up and chin down.  [employer]  Copyright  VHI. All rights reserved.  Posture - Sitting   Sit upright, head facing forward. Try using a roll to support lower back. Keep shoulders relaxed, and avoid rounded back. Keep hips level with knees. Avoid crossing legs for long periods. Don't perch on edge of seat, Sit on sit bones and not tail bones.     Copyright  VHI. All rights reserved.   Voncille Lo, PT Certified Exercise Expert for the Aging Adult  12/02/16 3:17 PM Phone: [phone] Fax: [phone]

## 2016-12-02 NOTE — Therapy (Signed)
[name], [name], [address] Phone: [phone]   Fax:  [phone]  Physical Therapy Treatment  Patient Details  Name: Miguel [name] MRN: [id number] Date of Birth: [birth year] Referring Provider: Jean Rosenthal, MD  Encounter Date: 12/02/2016      PT End of Session - 12/02/16 1547    Visit Number 9   Number of Visits 12   Date for PT Re-Evaluation 12/17/16   Authorization Type UMR / Medicare   PT Start Time 1500   PT Stop Time 1354   PT Time Calculation (min) 1374 min   Activity Tolerance Patient tolerated treatment well   Behavior During Therapy [facility] for tasks assessed/performed      Past Medical History:  Diagnosis Date  . Anemia   . Anxiety   . BPH (benign prostatic hyperplasia)   . Gastritis   . GERD (gastroesophageal reflux disease)    "seldom" (07/16/2016)  . GI bleed due to NSAIDs 10/27/2015  . History of blood transfusion 06/2016   post OR/notes 07/15/2016  . History of hiatal hernia   . History of kidney stones   . Hyperlipidemia   . Hypertension   . Melanoma of back ([place])    "mid back"  . Sigmoid diverticulitis    with perforation    Past Surgical History:  Procedure Laterality Date  . COLON SURGERY     sigmoid  . COLOSTOMY TAKEDOWN N/A 06/28/2016   Procedure: COLOSTOMY TAKEDOWN;  Surgeon: [name], MD;  Location: [place];  Service: General;  Laterality: N/A;  . ESOPHAGOGASTRODUODENOSCOPY N/A 10/27/2015   Procedure: ESOPHAGOGASTRODUODENOSCOPY (EGD);  Surgeon: Clarene Essex, MD;  Location: [facility] ENDOSCOPY;  Service: Endoscopy;  Laterality: N/A;  . ESOPHAGOGASTRODUODENOSCOPY N/A 10/29/2015   Procedure: ESOPHAGOGASTRODUODENOSCOPY (EGD);  Surgeon: Ronald Lobo, MD;  Location: [facility] ENDOSCOPY;  Service: Endoscopy;  Laterality: N/A;  . ESOPHAGOGASTRODUODENOSCOPY N/A 10/30/2015   Procedure: ESOPHAGOGASTRODUODENOSCOPY (EGD);  Surgeon: Clarene Essex, MD;  Location: [facility] ENDOSCOPY;  Service:  Endoscopy;  Laterality: N/A;  . GASTROSTOMY TUBE PLACEMENT  11/21/2015   REDUCTION OF HIATAL HERNIA , REPAIR HIATAL HERNIA, RESECTION SMALL BOWEL WITH ANASTOMOSIS, PLACEMENT GASTROSTOMY TUBE, PLACEMENT DUODENOSTOMY TUBE (N/A)  . HEMORRHOID BANDING  X 2  . HERNIA REPAIR    . HIATAL HERNIA REPAIR N/A 11/21/2015   Procedure: REDUCTION OF HIATAL HERNIA , REPAIR HIATAL HERNIA, RESECTION SMALL BOWEL WITH ANASTOMOSIS, PLACEMENT GASTROSTOMY TUBE, PLACEMENT DUODENOSTOMY TUBE;  Surgeon: Mickeal Skinner, MD;  Location: [place];  Service: General;  Laterality: N/A;  . [place]  06/28/2016   open/notes 07/15/2016  . IR GUIDED DRAIN W CATHETER PLACEMENT  07/06/2016   /NOTES 07/15/2016  . IR RADIOLOGIST EVAL & MGMT  07/27/2016  . IR RADIOLOGIST EVAL & MGMT  08/17/2016  . IR RADIOLOGIST EVAL & MGMT  08/26/2016  . IR [place] TUBE PERCUT W/FLUORO  08/04/2016  . IR US GUIDE BX ASP/DRAIN  08/04/2016  . KNEE CARTILAGE SURGERY Right 1971   "opened me up"  . LAPAROTOMY N/A 07/05/2015   Procedure: PARTIAL SIGMOID COLECTOMY AND COLOSTOMY;  Surgeon: [name], MD;  Location: [place];  Service: General;  Laterality: N/A;  . MELANOMA EXCISION  2001  . 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  10/30/2015   Procedure: REMOVAL OF DISTAL STOMACH;  Surgeon: Judeth Horn, MD;  Location: [place];  Service: General;;  . REPAIR OF PERFORATED ULCER N/A 10/30/2015   Procedure: REPAIR OF BLEEDING  ULCER;  Surgeon: Jeneen Rinks  Hulen Skains, MD;  Location: [place];  Service: General;  Laterality: N/A;  . TUMOR EXCISION  2009   "back; fatty tumor"    There were no vitals filed for this visit.      Subjective Assessment - 12/02/16 1507    Subjective I went to prayer breakfast today and feeding homeless and I think I did too much standing.   Limitations Lifting;Standing;Walking;House hold activities   How long can you sit comfortably? 1 hour to go to church   How long can you stand comfortably? 15 min   How  long can you walk comfortably? maybe  1/2 mile   Diagnostic tests x ray: OA   Patient Stated Goals Get releif from pain   Currently in Pain? Yes   Pain Score 5    Pain Location Back   Pain Descriptors / Indicators Aching   Pain Type Chronic pain   Pain Onset More than a month ago   Pain Frequency Constant                         [facility] Adult PT Treatment/Exercise - 12/02/16 1512      Self-Care   Self-Care Other Self-Care Comments;Posture   Posture increasing knowledge of sitting and standing posture.  Pt sits with sacral sitting and C posture   Other Self-Care Comments  positioning for thoracic extension and abdominal flexion. stretch.     Neck Exercises: Seated   Other Seated Exercise thoracic rotation 2 x 10   Other Seated Exercise thoracic extension 2 x 10 over back of chair with hands behind head and elbows adducted  cues to keep chin tucked     Lumbar Exercises: Stretches   Prone Mid Back Stretch 2 reps;30 seconds  performed in sitting reaching for shins.     Lumbar Exercises: Standing   Other Standing Lumbar Exercises self lumbar mobs using sheet 2 x 10 working up segments gradually   Other Standing Lumbar Exercises standing at wall with UE flex and wash cloth glides for active thoracic extension x 20     Moist Heat Therapy   Number Minutes Moist Heat 15 Minutes   Moist Heat Location Lumbar Spine  thoracic     Electrical Stimulation   Electrical Stimulation Location --   Electrical Stimulation Action --   Electrical Stimulation Parameters --   Electrical Stimulation Goals --     Manual Therapy   Manual Therapy Joint mobilization   Manual therapy comments Pt using pink foam roller for self thoracic mob guided by PT and  also with anterior trumk stretch with arms overhead with Moist hot pack,   Joint Mobilization Grade II and II PA glides in prone with illow to T3-T7'; Patient reportied improved pain after and feeling looser.    Soft tissue  mobilization IASTM Thoracolumbar paraspinals , Myofascial release of abdominals on right          Trigger Point Dry Needling - 12/02/16 1546    Consent Given? Yes   Education Handout Provided No  previously given   Muscles Treated Upper Body Longissimus  T- 4 to T-11 bil paraspinals   Longissimus Response Twitch response elicited;Palpable increased muscle length              PT Education - 12/02/16 1517    Education provided Yes   Education Details posture sitting and standing education   Person(s) Educated Patient   Methods Explanation;Demonstration;Handout   Comprehension Verbalized understanding;Returned demonstration  PT Short Term Goals - 11/25/16 2059      PT SHORT TERM GOAL #1   Title Pt will be indepenent in HEP as it has been established    Time 2   Period Weeks   Status Achieved     PT SHORT TERM GOAL #2   Title Pt will verbalize improvement in postural awareness with functional activities   Baseline is being more aware of his posture    Time 3   Period Weeks   Status Achieved     PT SHORT TERM GOAL #3   Title Pt will be able to sit for at least 20 min pain <=4/10   Time 3   Period Weeks   Status On-going           PT Long Term Goals - 11/03/16 1237      PT LONG TERM GOAL #1   Title He will be independent with all HEp issued    Time 6   Period Weeks   Status New     PT LONG TERM GOAL #2   Title His back pain will become intermittant with activity on feet   Time 6   Period Weeks   Status New     PT LONG TERM GOAL #3   Title He will be able to be on feet for 30-45 min before pain starts   Time 6   Period Weeks   Status New     PT LONG TERM GOAL #4   Title He will be able to sit with no pain for 30 min   Time 6   Period Weeks   Status New     PT LONG TERM GOAL #5   Title FOTO score will improve to < 20% limited   Time 6   Period Weeks   Status New               Plan - 12/02/16 1548    Clinical  Impression Statement Pt reports a 2/10 after treatment and then pt was educated on sitting and standing posture and how to maintain througthout day.  Pt seems to greatly benefit from TDN.  Pt has very tight abdominals causing some of the flexion of spine.  Pt educated on postioning at home to promote extendied spine.   Rehab Potential Good   PT Frequency 2x / week   PT Duration 6 weeks   PT Treatment/Interventions Electrical Stimulation;Iontophoresis 4mg /ml Dexamethasone;Moist Heat;Ultrasound;Patient/family education;Passive range of motion;Manual techniques;Therapeutic exercise;Therapeutic activities;Dry needling;Taping   PT Next Visit Plan continue with TPDN; continue with manual therapy to the thoracic spine. Continue with posterior chain strengthening; consider advancement to green band when able.    PT Home Exercise Plan posture awareness, stretching of spine, arm opening, decompression; thoracic side bend; posterior capsule; sink stretch, scpa retraction; shoulder extension; seated thoracic rotation, extension over chair. standing with UE flex bil and increasiing thoracic extension   Consulted and Agree with Plan of Care Patient      Patient will benefit from skilled therapeutic intervention in order to improve the following deficits and impairments:  Pain, Decreased activity tolerance, Postural dysfunction, Decreased strength, Decreased range of motion, Difficulty walking, Increased muscle spasms  Visit Diagnosis: Pain in thoracic spine  Muscle spasm of back  Muscle weakness (generalized)  Abnormal posture     Problem List Patient Active Problem List   Diagnosis Date Noted  . Malnutrition of moderate degree 07/19/2016  . Intra-abdominal abscess ([place]) 07/16/2016  . Cellulitis  07/15/2016  . S/P colostomy takedown 06/28/2016  . Pain in thoracic spine 06/09/2016  . Mid back pain 06/09/2016  . Postoperative fever 11/19/2015  . 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 Severe  protein-calorie malnutrition ([place]) 11/17/2015  . Sepsis ([place]) 11/14/2015  . Hiccups 11/14/2015  . AKI (acute kidney injury) ([place]) 11/14/2015  . Fever   . Leg swelling   . Left shoulder pain   . Muscle spasm of left shoulder   . GI bleed 10/27/2015  . Acute blood loss anemia 10/27/2015  . Syncope 10/27/2015  . Hyperglycemia 10/27/2015  . Hypotension 10/27/2015  . Neck pain 10/27/2015  . Hematemesis 10/27/2015  . Hematochezia 10/27/2015  . Diverticulitis of colon with perforation 07/05/2015   Voncille Lo, PT Certified Exercise Expert for the Aging Adult  12/02/16 3:54 PM Phone: [phone] Fax: [facility] [address] [place], [name], [address] Phone: [phone]   Fax:  [phone]  Name: KEANO GUGGENHEIM MRN: [id number] Date of Birth: 1951/01/24

## 2016-12-07 ENCOUNTER — Ambulatory Visit: Payer: 59 | Admitting: Physical Therapy

## 2016-12-07 ENCOUNTER — Encounter: Payer: Self-pay | Admitting: Physical Therapy

## 2016-12-07 DIAGNOSIS — M546 Pain in thoracic spine: Secondary | ICD-10-CM | POA: Diagnosis not present

## 2016-12-07 DIAGNOSIS — R293 Abnormal posture: Secondary | ICD-10-CM

## 2016-12-07 DIAGNOSIS — Z85820 Personal history of malignant melanoma of skin: Secondary | ICD-10-CM | POA: Diagnosis not present

## 2016-12-07 DIAGNOSIS — M62830 Muscle spasm of back: Secondary | ICD-10-CM

## 2016-12-07 DIAGNOSIS — M6281 Muscle weakness (generalized): Secondary | ICD-10-CM

## 2016-12-07 DIAGNOSIS — Z08 Encounter for follow-up examination after completed treatment for malignant neoplasm: Secondary | ICD-10-CM | POA: Diagnosis not present

## 2016-12-07 DIAGNOSIS — Z1283 Encounter for screening for malignant neoplasm of skin: Secondary | ICD-10-CM | POA: Diagnosis not present

## 2016-12-07 DIAGNOSIS — D225 Melanocytic nevi of trunk: Secondary | ICD-10-CM | POA: Diagnosis not present

## 2016-12-07 DIAGNOSIS — D485 Neoplasm of uncertain behavior of skin: Secondary | ICD-10-CM | POA: Diagnosis not present

## 2016-12-08 DIAGNOSIS — M546 Pain in thoracic spine: Secondary | ICD-10-CM | POA: Diagnosis not present

## 2016-12-08 DIAGNOSIS — M6281 Muscle weakness (generalized): Secondary | ICD-10-CM | POA: Diagnosis not present

## 2016-12-08 DIAGNOSIS — R293 Abnormal posture: Secondary | ICD-10-CM | POA: Diagnosis not present

## 2016-12-08 DIAGNOSIS — M62830 Muscle spasm of back: Secondary | ICD-10-CM | POA: Diagnosis not present

## 2016-12-08 NOTE — Therapy (Signed)
[place], [name], [address] Phone: [phone]   Fax:  [phone]  Physical Therapy Treatment  Patient Details  Name: Miguel [name] MRN: [id number] Date of Birth: Sep 19, 1950 Referring Provider: Jean Rosenthal, MD  Encounter Date: 12/07/2016      PT End of Session - 12/08/16 1105    Visit Number 10   Number of Visits 12   Date for PT Re-Evaluation 12/17/16   Authorization Type UMR / Medicare   PT Start Time 0930   PT Stop Time 1027   PT Time Calculation (min) 57 min   Activity Tolerance Patient tolerated treatment well   Behavior During Therapy [facility] for tasks assessed/performed      Past Medical History:  Diagnosis Date  . Anemia   . Anxiety   . BPH (benign prostatic hyperplasia)   . Gastritis   . GERD (gastroesophageal reflux disease)    "seldom" (07/16/2016)  . GI bleed due to NSAIDs 10/27/2015  . History of blood transfusion 06/2016   post OR/notes 07/15/2016  . History of hiatal hernia   . History of kidney stones   . Hyperlipidemia   . Hypertension   . Melanoma of back ([place])    "mid back"  . Sigmoid diverticulitis    with perforation    Past Surgical History:  Procedure Laterality Date  . COLON SURGERY     sigmoid  . COLOSTOMY TAKEDOWN N/A 06/28/2016   Procedure: COLOSTOMY TAKEDOWN;  Surgeon: [name], MD;  Location: [place];  Service: General;  Laterality: N/A;  . ESOPHAGOGASTRODUODENOSCOPY N/A 10/27/2015   Procedure: ESOPHAGOGASTRODUODENOSCOPY (EGD);  Surgeon: Clarene Essex, MD;  Location: [facility] ENDOSCOPY;  Service: Endoscopy;  Laterality: N/A;  . ESOPHAGOGASTRODUODENOSCOPY N/A 10/29/2015   Procedure: ESOPHAGOGASTRODUODENOSCOPY (EGD);  Surgeon: Ronald Lobo, MD;  Location: [facility] ENDOSCOPY;  Service: Endoscopy;  Laterality: N/A;  . ESOPHAGOGASTRODUODENOSCOPY N/A 10/30/2015   Procedure: ESOPHAGOGASTRODUODENOSCOPY (EGD);  Surgeon: Clarene Essex, MD;  Location: [facility] ENDOSCOPY;  Service:  Endoscopy;  Laterality: N/A;  . GASTROSTOMY TUBE PLACEMENT  11/21/2015   REDUCTION OF HIATAL HERNIA , REPAIR HIATAL HERNIA, RESECTION SMALL BOWEL WITH ANASTOMOSIS, PLACEMENT GASTROSTOMY TUBE, PLACEMENT DUODENOSTOMY TUBE (N/A)  . HEMORRHOID BANDING  X 2  . HERNIA REPAIR    . HIATAL HERNIA REPAIR N/A 11/21/2015   Procedure: REDUCTION OF HIATAL HERNIA , REPAIR HIATAL HERNIA, RESECTION SMALL BOWEL WITH ANASTOMOSIS, PLACEMENT GASTROSTOMY TUBE, PLACEMENT DUODENOSTOMY TUBE;  Surgeon: Mickeal Skinner, MD;  Location: [place];  Service: General;  Laterality: N/A;  . [place]  06/28/2016   open/notes 07/15/2016  . IR GUIDED DRAIN W CATHETER PLACEMENT  07/06/2016   /NOTES 07/15/2016  . IR RADIOLOGIST EVAL & MGMT  07/27/2016  . IR RADIOLOGIST EVAL & MGMT  08/17/2016  . IR RADIOLOGIST EVAL & MGMT  08/26/2016  . IR [place] TUBE PERCUT W/FLUORO  08/04/2016  . IR US GUIDE BX ASP/DRAIN  08/04/2016  . KNEE CARTILAGE SURGERY Right 1971   "opened me up"  . LAPAROTOMY N/A 07/05/2015   Procedure: PARTIAL SIGMOID COLECTOMY AND COLOSTOMY;  Surgeon: [name], MD;  Location: [place];  Service: General;  Laterality: N/A;  . MELANOMA EXCISION  2001  . 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  10/30/2015   Procedure: REMOVAL OF DISTAL STOMACH;  Surgeon: Judeth Horn, MD;  Location: [place];  Service: General;;  . REPAIR OF PERFORATED ULCER N/A 10/30/2015   Procedure: REPAIR OF BLEEDING  ULCER;  Surgeon: Jeneen Rinks  Hulen Skains, MD;  Location: [place];  Service: General;  Laterality: N/A;  . TUMOR EXCISION  2009   "back; fatty tumor"    There were no vitals filed for this visit.      Subjective Assessment - 12/07/16 0937    Subjective Patient reports that he feels like it is improving. He is having some minor pain todya when he woke up but overall he feels like he is doing very good.    Limitations Lifting;Standing;Walking;House hold activities   How long can you sit comfortably? 1 hour to go  to church   How long can you stand comfortably? 15 min   How long can you walk comfortably? maybe  1/2 mile   Diagnostic tests x ray: OA   Currently in Pain? Yes   Pain Score 3    Pain Location Back   Pain Orientation Mid;Posterior   Pain Descriptors / Indicators Aching   Pain Frequency Constant   Aggravating Factors  lifting items    Pain Relieving Factors lying down    Effect of Pain on Daily Activities diffiuclty performing ADL's    Multiple Pain Sites No                         [facility] Adult PT Treatment/Exercise - 12/08/16 0001      Lumbar Exercises: Standing   Other Standing Lumbar Exercises shoulder extension red 2x10; scpaular retraction 2x10;    Other Standing Lumbar Exercises self toracic stretch; seated thoracic ball stretch 5x10 sec hold      Manual Therapy   Manual Therapy Joint mobilization   Manual therapy comments Pt using pink foam roller for self thoracic mob guided by PT and  also with anterior trumk stretch with arms overhead with Moist hot pack,   Joint Mobilization Grade II and II PA glides in prone with illow to T3-T7'; Patient reportied improved pain after and feeling looser.    Soft tissue mobilization IASTM Thoracolumbar paraspinals , Myofascial release of abdominals on right          Trigger Point Dry Needling - 12/08/16 1115    Consent Given? Yes   Education Handout Provided No   Muscles Treated Upper Body Longissimus   Longissimus Response Twitch response elicited  t-4- through t-11 paraspiansl               PT Education - 12/08/16 1105    Education provided Yes   Education Details reviewed technique with strethcing and strengthening activity    Person(s) Educated Patient   Methods Explanation;Demonstration;Handout   Comprehension Verbalized understanding;Returned demonstration          PT Short Term Goals - 12/08/16 1113      PT SHORT TERM GOAL #1   Title Pt will be indepenent in HEP as it has been established     Time 2   Period Weeks   Status Achieved     PT SHORT TERM GOAL #2   Title Pt will verbalize improvement in postural awareness with functional activities   Baseline is being more aware of his posture    Time 3   Period Weeks   Status Achieved     PT SHORT TERM GOAL #3   Title Pt will be able to sit for at least 20 min pain <=4/10   Time 3   Period Weeks   Status Achieved           PT Long Term Goals -  11/03/16 1237      PT LONG TERM GOAL #1   Title He will be independent with all HEp issued    Time 6   Period Weeks   Status New     PT LONG TERM GOAL #2   Title His back pain will become intermittant with activity on feet   Time 6   Period Weeks   Status New     PT LONG TERM GOAL #3   Title He will be able to be on feet for 30-45 min before pain starts   Time 6   Period Weeks   Status New     PT LONG TERM GOAL #4   Title He will be able to sit with no pain for 30 min   Time 6   Period Weeks   Status New     PT LONG TERM GOAL #5   Title FOTO score will improve to < 20% limited   Time 6   Period Weeks   Status New               Plan - 12/21/2016 1112    Clinical Impression Statement Patient reportedjust aminor ache this morning. He feels like he is making progress. He has had very little pain. He has been working on his stretches and exercises at home.    Clinical Presentation Stable   Clinical Decision Making Moderate   Rehab Potential Good   PT Frequency 2x / week   PT Duration 6 weeks   PT Treatment/Interventions Electrical Stimulation;Iontophoresis 4mg /ml Dexamethasone;Moist Heat;Ultrasound;Patient/family education;Passive range of motion;Manual techniques;Therapeutic exercise;Therapeutic activities;Dry needling;Taping   PT Next Visit Plan continue with TPDN; continue with manual therapy to the thoracic spine. Continue with posterior chain strengthening; consider advancement to green band when able.    PT Home Exercise Plan posture awareness,  stretching of spine, arm opening, decompression; thoracic side bend; posterior capsule; sink stretch, scpa retraction; shoulder extension; seated thoracic rotation, extension over chair. standing with UE flex bil and increasiing thoracic extension   Consulted and Agree with Plan of Care Patient      Patient will benefit from skilled therapeutic intervention in order to improve the following deficits and impairments:  Pain, Decreased activity tolerance, Postural dysfunction, Decreased strength, Decreased range of motion, Difficulty walking, Increased muscle spasms  Visit Diagnosis: Pain in thoracic spine  Muscle spasm of back  Muscle weakness (generalized)  Abnormal posture       G-Codes - 21-Dec-2016 1114    Functional Assessment Tool Used (Outpatient Only) FOTO, clinical decision making    Functional Limitation Mobility: Walking and moving around   Mobility: Walking and Moving Around Current Status ([id number]) At least 20 percent but less than 40 percent impaired, limited or restricted   Mobility: Walking and Moving Around Goal Status [phone]) At least 1 percent but less than 20 percent impaired, limited or restricted      Problem List Patient Active Problem List   Diagnosis Date Noted  . Malnutrition of moderate degree 07/19/2016  . Intra-abdominal abscess ([place]) 07/16/2016  . Cellulitis 07/15/2016  . S/P colostomy takedown 06/28/2016  . Pain in thoracic spine 06/09/2016  . Mid back pain 06/09/2016  . Postoperative fever 11/19/2015  . 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 Severe protein-calorie malnutrition ([place]) 11/17/2015  . Sepsis ([place]) 11/14/2015  . Hiccups 11/14/2015  . AKI (acute kidney injury) ([place]) 11/14/2015  . Fever   . Leg swelling   . Left shoulder pain   . Muscle  spasm of left shoulder   . GI bleed 10/27/2015  . Acute blood loss anemia 10/27/2015  . Syncope 10/27/2015  . Hyperglycemia 10/27/2015  . Hypotension 10/27/2015  . Neck pain 10/27/2015  . Hematemesis  10/27/2015  . Hematochezia 10/27/2015  . Diverticulitis of colon with perforation 07/05/2015    Carney Living PT DPT  12/08/2016, 11:21 AM  [facility] [address] [place], [name], [address] Phone: [phone]   Fax:  [phone]  Name: Miguel [name] MRN: [id number] Date of Birth: Jan 03, 1951

## 2016-12-09 ENCOUNTER — Ambulatory Visit: Payer: 59 | Admitting: Physical Therapy

## 2016-12-09 ENCOUNTER — Encounter: Payer: Self-pay | Admitting: Physical Therapy

## 2016-12-09 DIAGNOSIS — M62830 Muscle spasm of back: Secondary | ICD-10-CM

## 2016-12-09 DIAGNOSIS — R293 Abnormal posture: Secondary | ICD-10-CM

## 2016-12-09 DIAGNOSIS — M546 Pain in thoracic spine: Secondary | ICD-10-CM

## 2016-12-09 DIAGNOSIS — M6281 Muscle weakness (generalized): Secondary | ICD-10-CM

## 2016-12-09 NOTE — Therapy (Signed)
[place], [name], [address] Phone: [phone]   Fax:  [phone]  Physical Therapy Treatment  Patient Details  Name: Miguel [name] MRN: [id number] Date of Birth: 1950/10/03 Referring Provider: Jean Rosenthal, MD  Encounter Date: 12/09/2016      PT End of Session - 12/09/16 0814    Visit Number 11   Number of Visits 12   Date for PT Re-Evaluation 12/17/16   Authorization Type UMR / Medicare   PT Start Time 0806   PT Stop Time 0901   PT Time Calculation (min) 55 min   Activity Tolerance Patient tolerated treatment well   Behavior During Therapy [facility] for tasks assessed/performed      Past Medical History:  Diagnosis Date  . Anemia   . Anxiety   . BPH (benign prostatic hyperplasia)   . Gastritis   . GERD (gastroesophageal reflux disease)    "seldom" (07/16/2016)  . GI bleed due to NSAIDs 10/27/2015  . History of blood transfusion 06/2016   post OR/notes 07/15/2016  . History of hiatal hernia   . History of kidney stones   . Hyperlipidemia   . Hypertension   . Melanoma of back ([place])    "mid back"  . Sigmoid diverticulitis    with perforation    Past Surgical History:  Procedure Laterality Date  . COLON SURGERY     sigmoid  . COLOSTOMY TAKEDOWN N/A 06/28/2016   Procedure: COLOSTOMY TAKEDOWN;  Surgeon: [name], MD;  Location: [place];  Service: General;  Laterality: N/A;  . ESOPHAGOGASTRODUODENOSCOPY N/A 10/27/2015   Procedure: ESOPHAGOGASTRODUODENOSCOPY (EGD);  Surgeon: Clarene Essex, MD;  Location: [facility] ENDOSCOPY;  Service: Endoscopy;  Laterality: N/A;  . ESOPHAGOGASTRODUODENOSCOPY N/A 10/29/2015   Procedure: ESOPHAGOGASTRODUODENOSCOPY (EGD);  Surgeon: Ronald Lobo, MD;  Location: [facility] ENDOSCOPY;  Service: Endoscopy;  Laterality: N/A;  . ESOPHAGOGASTRODUODENOSCOPY N/A 10/30/2015   Procedure: ESOPHAGOGASTRODUODENOSCOPY (EGD);  Surgeon: Clarene Essex, MD;  Location: [facility] ENDOSCOPY;  Service:  Endoscopy;  Laterality: N/A;  . GASTROSTOMY TUBE PLACEMENT  11/21/2015   REDUCTION OF HIATAL HERNIA , REPAIR HIATAL HERNIA, RESECTION SMALL BOWEL WITH ANASTOMOSIS, PLACEMENT GASTROSTOMY TUBE, PLACEMENT DUODENOSTOMY TUBE (N/A)  . HEMORRHOID BANDING  X 2  . HERNIA REPAIR    . HIATAL HERNIA REPAIR N/A 11/21/2015   Procedure: REDUCTION OF HIATAL HERNIA , REPAIR HIATAL HERNIA, RESECTION SMALL BOWEL WITH ANASTOMOSIS, PLACEMENT GASTROSTOMY TUBE, PLACEMENT DUODENOSTOMY TUBE;  Surgeon: Mickeal Skinner, MD;  Location: [place];  Service: General;  Laterality: N/A;  . [place]  06/28/2016   open/notes 07/15/2016  . IR GUIDED DRAIN W CATHETER PLACEMENT  07/06/2016   /NOTES 07/15/2016  . IR RADIOLOGIST EVAL & MGMT  07/27/2016  . IR RADIOLOGIST EVAL & MGMT  08/17/2016  . IR RADIOLOGIST EVAL & MGMT  08/26/2016  . IR [place] TUBE PERCUT W/FLUORO  08/04/2016  . IR US GUIDE BX ASP/DRAIN  08/04/2016  . KNEE CARTILAGE SURGERY Right 1971   "opened me up"  . LAPAROTOMY N/A 07/05/2015   Procedure: PARTIAL SIGMOID COLECTOMY AND COLOSTOMY;  Surgeon: [name], MD;  Location: [place];  Service: General;  Laterality: N/A;  . MELANOMA EXCISION  2001  . 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 REPAIR OF PERFORATED ULCER N/A 10/30/2015   Procedure: REPAIR OF BLEEDING  ULCER;  Surgeon: Jeneen Rinks  Hulen Skains, MD;  Location: [place];  Service: General;  Laterality: N/A;  . TUMOR EXCISION  2009   "back; fatty tumor"    There were no vitals filed for this visit.      Subjective Assessment - 12/09/16 0812    Subjective Patient reports no pain this morning. He reports overall he is feeling good. He has been working on his exercises at home    Limitations Lifting;Standing;Walking;House hold activities   How long can you sit comfortably? 1 hour to go to church   How long can you  stand comfortably? 15 min   How long can you walk comfortably? maybe  1/2 mile   Diagnostic tests x ray: OA   Patient Stated Goals Get releif from pain   Currently in Pain? Yes   Pain Score 3    Pain Location Back   Pain Orientation Mid;Posterior   Pain Descriptors / Indicators Aching   Pain Type Chronic pain   Pain Onset More than a month ago   Pain Frequency Constant   Aggravating Factors  lifting items    Pain Relieving Factors lying down    Effect of Pain on Daily Activities difficulty performing ADL's                          [facility] Adult PT Treatment/Exercise - 12/09/16 0001      Lumbar Exercises: Standing   Other Standing Lumbar Exercises shoulder extension red 2x10; scpaular retraction 2x10;      Manual Therapy   Manual Therapy Joint mobilization   Manual therapy comments side lying reach with abdominal stretch and lumbar manual stretch.    Joint Mobilization Grade II and II PA glides in prone with illow to T3-T7'; Patient reportied improved pain after and feeling looser.    Soft tissue mobilization IASTM Thoracolumbar paraspinals , Myofascial release of abdominals on right          Trigger Point Dry Needling - 12/08/16 1115    Consent Given? Yes   Education Handout Provided No   Muscles Treated Upper Body Longissimus   Longissimus Response Twitch response elicited  t-4- through t-11 paraspiansl               PT Education - 12/09/16 0813    Education provided Yes   Education Details reviewed exercises and stretching with patient    Person(s) Educated Patient   Methods Explanation;Demonstration;Handout   Comprehension Verbalized understanding;Returned demonstration;Verbal cues required;Tactile cues required          PT Short Term Goals - 12/08/16 1113      PT SHORT TERM GOAL #1   Title Pt will be indepenent in HEP as it has been established    Time 2   Period Weeks   Status Achieved     PT SHORT TERM GOAL #2   Title Pt will  verbalize improvement in postural awareness with functional activities   Baseline is being more aware of his posture    Time 3   Period Weeks   Status Achieved     PT SHORT TERM GOAL #3   Title Pt will be able to sit for at least 20 min pain <=4/10   Time 3   Period Weeks   Status Achieved           PT Long Term Goals - 11/03/16 1237      PT LONG TERM GOAL #1   Title He will be independent  with all HEp issued    Time 6   Period Weeks   Status New     PT LONG TERM GOAL #2   Title His back pain will become intermittant with activity on feet   Time 6   Period Weeks   Status New     PT LONG TERM GOAL #3   Title He will be able to be on feet for 30-45 min before pain starts   Time 6   Period Weeks   Status New     PT LONG TERM GOAL #4   Title He will be able to sit with no pain for 30 min   Time 6   Period Weeks   Status New     PT LONG TERM GOAL #5   Title FOTO score will improve to < 20% limited   Time 6   Period Weeks   Status New               Plan - 12/09/16 1538    Clinical Impression Statement Patient was 6 minutes late for his appointment Patient is making good progress. He only required 1 needle today with trigger point dry needling. Therapy continues to focus on mobilizations and manual therapy. Next visit threrapy will change focus towards manual therapy.    Clinical Presentation Stable   Clinical Decision Making Low   Rehab Potential Good   PT Frequency 2x / week   PT Duration 6 weeks   PT Treatment/Interventions Electrical Stimulation;Iontophoresis 4mg /ml Dexamethasone;Moist Heat;Ultrasound;Patient/family education;Passive range of motion;Manual techniques;Therapeutic exercise;Therapeutic activities;Dry needling;Taping   PT Next Visit Plan continue with TPDN; continue with manual therapy to the thoracic spine. Continue with posterior chain strengthening; consider advancement to green band when able.    PT Home Exercise Plan posture awareness,  stretching of spine, arm opening, decompression; thoracic side bend; posterior capsule; sink stretch, scpa retraction; shoulder extension; seated thoracic rotation, extension over chair. standing with UE flex bil and increasiing thoracic extension   Consulted and Agree with Plan of Care Patient      Patient will benefit from skilled therapeutic intervention in order to improve the following deficits and impairments:  Pain, Decreased activity tolerance, Postural dysfunction, Decreased strength, Decreased range of motion, Difficulty walking, Increased muscle spasms  Visit Diagnosis: Pain in thoracic spine  Muscle spasm of back  Muscle weakness (generalized)  Abnormal posture       G-Codes - 2016-12-21 1114    Functional Assessment Tool Used (Outpatient Only) FOTO, clinical decision making    Functional Limitation Mobility: Walking and moving around   Mobility: Walking and Moving Around Current Status (T0626) At least 20 percent but less than 40 percent impaired, limited or restricted   Mobility: Walking and Moving Around Goal Status [phone]) At least 1 percent but less than 20 percent impaired, limited or restricted      Problem List Patient Active Problem List   Diagnosis Date Noted  . Malnutrition of moderate degree 07/19/2016  . Intra-abdominal abscess ([place]) 07/16/2016  . Cellulitis 07/15/2016  . S/P colostomy takedown 06/28/2016  . Pain in thoracic spine 06/09/2016  . Mid back pain 06/09/2016  . Postoperative fever 11/19/2015  . 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 Severe protein-calorie malnutrition ([place]) 11/17/2015  . Sepsis ([place]) 11/14/2015  . Hiccups 11/14/2015  . AKI (acute kidney injury) ([place]) 11/14/2015  . Fever   . Leg swelling   . Left shoulder pain   . Muscle spasm of left shoulder   .  GI bleed 10/27/2015  . Acute blood loss anemia 10/27/2015  . Syncope 10/27/2015  . Hyperglycemia 10/27/2015  . Hypotension 10/27/2015  . Neck pain 10/27/2015  . Hematemesis  10/27/2015  . Hematochezia 10/27/2015  . Diverticulitis of colon with perforation 07/05/2015    Carney Living PT DPT  12/09/2016, 3:42 PM  [facility] [address] [place], [name], [address] Phone: [phone]   Fax:  [phone]  Name: KAHLE MCQUEEN MRN: [id number] Date of Birth: [birth year]

## 2016-12-14 ENCOUNTER — Ambulatory Visit: Payer: 59 | Admitting: Physical Therapy

## 2016-12-14 DIAGNOSIS — M6281 Muscle weakness (generalized): Secondary | ICD-10-CM

## 2016-12-14 DIAGNOSIS — R293 Abnormal posture: Secondary | ICD-10-CM

## 2016-12-14 DIAGNOSIS — M546 Pain in thoracic spine: Secondary | ICD-10-CM

## 2016-12-14 DIAGNOSIS — M62830 Muscle spasm of back: Secondary | ICD-10-CM | POA: Diagnosis not present

## 2016-12-14 NOTE — Therapy (Signed)
[place], [name], [address] Phone: [phone]   Fax:  [phone]  Physical Therapy Treatment  Patient Details  Name: Miguel [name] MRN: [id number] Date of Birth: [birth year] Referring Provider: Jean Rosenthal, MD  Encounter Date: 12/14/2016      PT End of Session - 12/14/16 1627    Visit Number 12   Number of Visits 20   Date for PT Re-Evaluation 01/11/17   PT Start Time 0932   PT Stop Time 1016   PT Time Calculation (min) 44 min   Activity Tolerance Patient tolerated treatment well   Behavior During Therapy [facility] for tasks assessed/performed      Past Medical History:  Diagnosis Date  . Anemia   . Anxiety   . BPH (benign prostatic hyperplasia)   . Gastritis   . GERD (gastroesophageal reflux disease)    "seldom" (07/16/2016)  . GI bleed due to NSAIDs 10/27/2015  . History of blood transfusion 06/2016   post OR/notes 07/15/2016  . History of hiatal hernia   . History of kidney stones   . Hyperlipidemia   . Hypertension   . Melanoma of back ([place])    "mid back"  . Sigmoid diverticulitis    with perforation    Past Surgical History:  Procedure Laterality Date  . COLON SURGERY     sigmoid  . COLOSTOMY TAKEDOWN N/A 06/28/2016   Procedure: COLOSTOMY TAKEDOWN;  Surgeon: [name], MD;  Location: [place];  Service: General;  Laterality: N/A;  . ESOPHAGOGASTRODUODENOSCOPY N/A 10/27/2015   Procedure: ESOPHAGOGASTRODUODENOSCOPY (EGD);  Surgeon: Clarene Essex, MD;  Location: [facility] ENDOSCOPY;  Service: Endoscopy;  Laterality: N/A;  . ESOPHAGOGASTRODUODENOSCOPY N/A 10/29/2015   Procedure: ESOPHAGOGASTRODUODENOSCOPY (EGD);  Surgeon: Ronald Lobo, MD;  Location: [facility] ENDOSCOPY;  Service: Endoscopy;  Laterality: N/A;  . ESOPHAGOGASTRODUODENOSCOPY N/A 10/30/2015   Procedure: ESOPHAGOGASTRODUODENOSCOPY (EGD);  Surgeon: Clarene Essex, MD;  Location: [facility] ENDOSCOPY;  Service: Endoscopy;  Laterality: N/A;  .  GASTROSTOMY TUBE PLACEMENT  11/21/2015   REDUCTION OF HIATAL HERNIA , REPAIR HIATAL HERNIA, RESECTION SMALL BOWEL WITH ANASTOMOSIS, PLACEMENT GASTROSTOMY TUBE, PLACEMENT DUODENOSTOMY TUBE (N/A)  . HEMORRHOID BANDING  X 2  . HERNIA REPAIR    . HIATAL HERNIA REPAIR N/A 11/21/2015   Procedure: REDUCTION OF HIATAL HERNIA , REPAIR HIATAL HERNIA, RESECTION SMALL BOWEL WITH ANASTOMOSIS, PLACEMENT GASTROSTOMY TUBE, PLACEMENT DUODENOSTOMY TUBE;  Surgeon: Mickeal Skinner, MD;  Location: [place];  Service: General;  Laterality: N/A;  . [place]  06/28/2016   open/notes 07/15/2016  . IR GUIDED DRAIN W CATHETER PLACEMENT  07/06/2016   /NOTES 07/15/2016  . IR RADIOLOGIST EVAL & MGMT  07/27/2016  . IR RADIOLOGIST EVAL & MGMT  08/17/2016  . IR RADIOLOGIST EVAL & MGMT  08/26/2016  . IR [place] TUBE PERCUT W/FLUORO  08/04/2016  . IR US GUIDE BX ASP/DRAIN  08/04/2016  . KNEE CARTILAGE SURGERY Right 1971   "opened me up"  . LAPAROTOMY N/A 07/05/2015   Procedure: PARTIAL SIGMOID COLECTOMY AND COLOSTOMY;  Surgeon: [name], MD;  Location: [place];  Service: General;  Laterality: N/A;  . MELANOMA EXCISION  2001  . 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  10/30/2015   Procedure: REMOVAL OF DISTAL STOMACH;  Surgeon: Judeth Horn, MD;  Location: [place];  Service: General;;  . REPAIR OF PERFORATED ULCER N/A 10/30/2015   Procedure: REPAIR OF BLEEDING  ULCER;  Surgeon: Judeth Horn, MD;  Location: [place];  Service: General;  Laterality: N/A;  . TUMOR EXCISION  2009   "back; fatty tumor"    There were no vitals filed for this visit.      Subjective Assessment - 12/14/16 1625    Subjective Patient reports lower back pain today. he had a small amount of throacic pain over the weekend.    Limitations Lifting;Standing;Walking;House hold activities   How long can you sit comfortably? 1 hour to go to church   How long can you stand comfortably? 15 min   How long can you walk  comfortably? maybe  1/2 mile   Diagnostic tests x ray: OA   Patient Stated Goals Get releif from pain   Currently in Pain? Yes   Pain Score 3    Pain Location Back   Pain Orientation Lower   Pain Descriptors / Indicators Aching   Pain Type Chronic pain   Pain Onset More than a month ago   Pain Frequency Constant   Aggravating Factors  lifting items    Pain Relieving Factors lying down    Effect of Pain on Daily Activities difficulty perfroming ADL's             [facility] PT Assessment - 12/14/16 0001      AROM   Lumbar Flexion 50   Lumbar Extension 22     Flexibility   Hamstrings 55     Palpation   Palpation comment improved tenderness to palpation in thoracic spine; tenderness to lumbar spine today.                      [facility] Adult PT Treatment/Exercise - 12/14/16 0001      Lumbar Exercises: Stretches   Passive Hamstring Stretch Limitations hamstring stretch 2x20 sec hold 90/90; also reviewed in sitting    Lower Trunk Rotation Limitations x10    Piriformis Stretch Limitations 2x20 seconds bilateral      Lumbar Exercises: Supine   Other Supine Lumbar Exercises Seated: thoriacic roll out seated with arms on ball 2x5      Manual Therapy   Manual Therapy Joint mobilization   Joint Mobilization Grade II and II PA glides in prone with illow to T3-T7'; Patient reportied improved pain after and feeling looser.    Soft tissue mobilization IASTM Thoracolumbar paraspinals , Myofascial release of abdominals on right          Trigger Point Dry Needling - 12/14/16 1624    Consent Given? Yes   Education Handout Provided No   Muscles Treated Upper Body Longissimus  lumbar paraspinals    Longissimus Response Twitch response elicited              PT Education - 12/14/16 1626    Education provided Yes   Education Details updated HEP for lumbar stretching    Person(s) Educated Patient   Methods Explanation;Demonstration;Handout   Comprehension Verbalized  understanding;Returned demonstration;Verbal cues required;Tactile cues required          PT Short Term Goals - 12/08/16 1113      PT SHORT TERM GOAL #1   Title Pt will be indepenent in HEP as it has been established    Time 2   Period Weeks   Status Achieved     PT SHORT TERM GOAL #2   Title Pt will verbalize improvement in postural awareness with functional activities   Baseline is being more aware of his posture    Time 3   Period Weeks  Status Achieved     PT SHORT TERM GOAL #3   Title Pt will be able to sit for at least 20 min pain <=4/10   Time 3   Period Weeks   Status Achieved           PT Long Term Goals - 11/03/16 1237      PT LONG TERM GOAL #1   Title He will be independent with all HEp issued    Time 6   Period Weeks   Status New     PT LONG TERM GOAL #2   Title His back pain will become intermittant with activity on feet   Time 6   Period Weeks   Status New     PT LONG TERM GOAL #3   Title He will be able to be on feet for 30-45 min before pain starts   Time 6   Period Weeks   Status New     PT LONG TERM GOAL #4   Title He will be able to sit with no pain for 30 min   Time 6   Period Weeks   Status New     PT LONG TERM GOAL #5   Title FOTO score will improve to < 20% limited   Time 6   Period Weeks   Status New               Plan - 12/14/16 1629    Clinical Impression Statement Patient continues to make good progress. His pain was more in his lower back today He reported improved pain with stretchin. Good twitch repsose ellicited in lu,bar spine today with trigger point dry needling. Next visit give patient therapy abll roll out for HEP. Patient would benefit from further therapy for 2W4 to continue to improve posture and decrease pain in the lumbar spine and lower back.    Clinical Presentation Stable   Clinical Decision Making Low   Rehab Potential Good   PT Frequency 2x / week   PT Duration 6 weeks   PT  Treatment/Interventions Electrical Stimulation;Iontophoresis 4mg /ml Dexamethasone;Moist Heat;Ultrasound;Patient/family education;Passive range of motion;Manual techniques;Therapeutic exercise;Therapeutic activities;Dry needling;Taping   PT Next Visit Plan continue with TPDN; continue with manual therapy to the thoracic spine. Continue with posterior chain strengthening; consider advancement to green band when able.    PT Home Exercise Plan posture awareness, stretching of spine, arm opening, decompression; thoracic side bend; posterior capsule; sink stretch, scpa retraction; shoulder extension; seated thoracic rotation, extension over chair. standing with UE flex bil and increasiing thoracic extension   Consulted and Agree with Plan of Care Patient      Patient will benefit from skilled therapeutic intervention in order to improve the following deficits and impairments:  Pain, Decreased activity tolerance, Postural dysfunction, Decreased strength, Decreased range of motion, Difficulty walking, Increased muscle spasms  Visit Diagnosis: Pain in thoracic spine - Plan: PT plan of care cert/re-cert  Muscle spasm of back - Plan: PT plan of care cert/re-cert  Muscle weakness (generalized) - Plan: PT plan of care cert/re-cert  Abnormal posture - Plan: PT plan of care cert/re-cert     Problem List Patient Active Problem List   Diagnosis Date Noted  . Malnutrition of moderate degree 07/19/2016  . Intra-abdominal abscess ([place]) 07/16/2016  . Cellulitis 07/15/2016  . S/P colostomy takedown 06/28/2016  . Pain in thoracic spine 06/09/2016  . Mid back pain 06/09/2016  . Postoperative fever 11/19/2015  . 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 Severe  protein-calorie malnutrition ([place]) 11/17/2015  . Sepsis ([place]) 11/14/2015  . Hiccups 11/14/2015  . AKI (acute kidney injury) ([place]) 11/14/2015  . Fever   . Leg swelling   . Left shoulder pain   . Muscle spasm of left shoulder   . GI bleed 10/27/2015   . Acute blood loss anemia 10/27/2015  . Syncope 10/27/2015  . Hyperglycemia 10/27/2015  . Hypotension 10/27/2015  . Neck pain 10/27/2015  . Hematemesis 10/27/2015  . Hematochezia 10/27/2015  . Diverticulitis of colon with perforation 07/05/2015    Carney Living PT DPT  12/14/2016, 4:59 PM  [facility] [address] [place], [name], [address] Phone: [phone]   Fax:  [phone]  Name: Miguel [name] MRN: [id number] Date of Birth: 1950/04/27

## 2016-12-16 ENCOUNTER — Ambulatory Visit: Payer: 59 | Admitting: Physical Therapy

## 2016-12-16 DIAGNOSIS — M546 Pain in thoracic spine: Secondary | ICD-10-CM | POA: Diagnosis not present

## 2016-12-16 DIAGNOSIS — R293 Abnormal posture: Secondary | ICD-10-CM

## 2016-12-16 DIAGNOSIS — M62830 Muscle spasm of back: Secondary | ICD-10-CM

## 2016-12-16 DIAGNOSIS — M6281 Muscle weakness (generalized): Secondary | ICD-10-CM

## 2016-12-17 ENCOUNTER — Encounter: Payer: Self-pay | Admitting: Physical Therapy

## 2016-12-17 NOTE — Therapy (Signed)
[place], [name], [address] Phone: [phone]   Fax:  [phone]  Physical Therapy Treatment  Patient Details  Name: Miguel [name] MRN: [id number] Date of Birth: 08/18/1950 Referring Provider: Jean Rosenthal, MD  Encounter Date: 12/16/2016      PT End of Session - 12/17/16 0759    Visit Number 13   Number of Visits 20   Date for PT Re-Evaluation 01/11/17   Authorization Type UMR / Medicare   PT Start Time 1150   PT Stop Time 1242   PT Time Calculation (min) 52 min   Activity Tolerance Patient tolerated treatment well   Behavior During Therapy [facility] for tasks assessed/performed      Past Medical History:  Diagnosis Date  . Anemia   . Anxiety   . BPH (benign prostatic hyperplasia)   . Gastritis   . GERD (gastroesophageal reflux disease)    "seldom" (07/16/2016)  . GI bleed due to NSAIDs 10/27/2015  . History of blood transfusion 06/2016   post OR/notes 07/15/2016  . History of hiatal hernia   . History of kidney stones   . Hyperlipidemia   . Hypertension   . Melanoma of back ([place])    "mid back"  . Sigmoid diverticulitis    with perforation    Past Surgical History:  Procedure Laterality Date  . COLON SURGERY     sigmoid  . COLOSTOMY TAKEDOWN N/A 06/28/2016   Procedure: COLOSTOMY TAKEDOWN;  Surgeon: [name], MD;  Location: [place];  Service: General;  Laterality: N/A;  . ESOPHAGOGASTRODUODENOSCOPY N/A 10/27/2015   Procedure: ESOPHAGOGASTRODUODENOSCOPY (EGD);  Surgeon: Clarene Essex, MD;  [facility] END[facility]COPY;  Service: Endoscopy;  Laterality: N/A;  . ESOPHAGOGASTRODUODENOSCOPY N/A 10/29/2015   Procedure: ESOPHAGOGASTRODUODENOSCOPY (EGD);  Surgeon: Ronald Lobo, MD;  Location: [facility] ENDOSCOPY;  Service: Endoscopy;  Laterality: N/A;  . ESOPHAGOGASTRODUODENOSCOPY N/A 10/30/2015   Procedure: ESOPHAGOGASTRODUODENOSCOPY (EGD);  Surgeon: Clarene Essex, MD;  Location: [facility] ENDOSCOPY;  Service:  Endoscopy;  Laterality: N/A;  . GASTROSTOMY TUBE PLACEMENT  11/21/2015   REDUCTION OF HIATAL HERNIA , REPAIR HIATAL HERNIA, RESECTION SMALL BOWEL WITH ANASTOMOSIS, PLACEMENT GASTROSTOMY TUBE, PLACEMENT DUODENOSTOMY TUBE (N/A)  . HEMORRHOID BANDING  X 2  . HERNIA REPAIR    . HIATAL HERNIA REPAIR N/A 11/21/2015   Procedure: REDUCTION OF HIATAL HERNIA , REPAIR HIATAL HERNIA, RESECTION SMALL BOWEL WITH ANASTOMOSIS, PLACEMENT GASTROSTOMY TUBE, PLACEMENT DUODENOSTOMY TUBE;  Surgeon: Mickeal Skinner, MD;  Location: [place];  Service: General;  Laterality: N/A;  . [place]  06/28/2016   open/notes 07/15/2016  . IR GUIDED DRAIN W CATHETER PLACEMENT  07/06/2016   /NOTES 07/15/2016  . IR RADIOLOGIST EVAL & MGMT  07/27/2016  . IR RADIOLOGIST EVAL & MGMT  08/17/2016  . IR RADIOLOGIST EVAL & MGMT  08/26/2016  . IR [place] TUBE PERCUT W/FLUORO  08/04/2016  . IR US GUIDE BX ASP/DRAIN  08/04/2016  . KNEE CARTILAGE SURGERY Right 1971   "opened me up"  . LAPAROTOMY N/A 07/05/2015   Procedure: PARTIAL SIGMOID COLECTOMY AND COLOSTOMY;  Surgeon: [name], MD;  Location: [place];  Service: General;  Laterality: N/A;  . MELANOMA EXCISION  2001  . 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 REPAIR OF PERFORATED ULCER N/A 10/30/2015   Procedure: REPAIR OF BLEEDING  ULCER;  Surgeon: Jeneen Rinks  Hulen Skains, MD;  Location: [place];  Service: General;  Laterality: N/A;  . TUMOR EXCISION  2009   "back; fatty tumor"    There were no vitals filed for this visit.      Subjective Assessment - 12/16/16 1159    Subjective Patient's back felt so good after last visit he went out ot trim the bushes. His bush trimmer kicked back on him and hit him in the chest. He has some focal pain in hte front of his chest. he has no pain in his back.    Limitations Lifting;Standing;Walking;House  hold activities   How long can you sit comfortably? 1 hour to go to church   How long can you stand comfortably? 15 min   How long can you walk comfortably? maybe  1/2 mile   Diagnostic tests x ray: OA   Patient Stated Goals Get releif from pain   Currently in Pain? No/denies                         [facility] Adult PT Treatment/Exercise - 12/17/16 0001      Self-Care   Other Self-Care Comments  reviewed rolling for lateral leg muscle soreness at home.      Lumbar Exercises: Stretches   Passive Hamstring Stretch Limitations hamstring stretch 2x20 sec hold 90/90; also reviewed in sitting    Lower Trunk Rotation Limitations x10    Piriformis Stretch Limitations 2x20 seconds bilateral      Lumbar Exercises: Standing   Other Standing Lumbar Exercises shoulder extension red 2x10; scpaular retraction 2x10;      Lumbar Exercises: Supine   Bent Knee Raise Limitations 2x10 with cuing fortechnique    Other Supine Lumbar Exercises Seated: thoriacic roll out seated with arms on ball 2x5    Other Supine Lumbar Exercises thomas stretch 2x10      Moist Heat Therapy   Number Minutes Moist Heat 10 Minutes   Moist Heat Location Lumbar Spine     Manual Therapy   Manual Therapy Joint mobilization   Joint Mobilization Grade II and II PA glides in prone with illow to T3-T7'; Patient reportied improved pain after and feeling looser.    Soft tissue mobilization IASTM Thoracolumbar paraspinals , Myofascial release of abdominals on right                PT Education - 12/17/16 0758    Education provided Yes   Education Details reviewed HEP; reviewed the improtance of core exerises.    Person(s) Educated Patient   Methods Explanation;Demonstration;Tactile cues   Comprehension Returned demonstration;Verbalized understanding;Tactile cues required;Verbal cues required          PT Short Term Goals - 12/08/16 1113      PT SHORT TERM GOAL #1   Title Pt will be indepenent in HEP  as it has been established    Time 2   Period Weeks   Status Achieved     PT SHORT TERM GOAL #2   Title Pt will verbalize improvement in postural awareness with functional activities   Baseline is being more aware of his posture    Time 3   Period Weeks   Status Achieved     PT SHORT TERM GOAL #3   Title Pt will be able to sit for at least 20 min pain <=4/10   Time 3   Period Weeks   Status Achieved           PT  Long Term Goals - 11/03/16 1237      PT LONG TERM GOAL #1   Title He will be independent with all HEp issued    Time 6   Period Weeks   Status New     PT LONG TERM GOAL #2   Title His back pain will become intermittant with activity on feet   Time 6   Period Weeks   Status New     PT LONG TERM GOAL #3   Title He will be able to be on feet for 30-45 min before pain starts   Time 6   Period Weeks   Status New     PT LONG TERM GOAL #4   Title He will be able to sit with no pain for 30 min   Time 6   Period Weeks   Status New     PT LONG TERM GOAL #5   Title FOTO score will improve to < 20% limited   Time 6   Period Weeks   Status New               Plan - 12/17/16 0801    Clinical Impression Statement Patient is making good progress. Therapy will gear him more towrads exercises next wek. He was able to do yard work thisTherapy will re-assess next [employer]. If he continues to do well we may discharge. Therapy continues to work on manual therapy to    Clinical Presentation Stable   Clinical Presentation due to: chronic pain    Clinical Decision Making Low   Rehab Potential Good   PT Frequency 2x / week   PT Duration 6 weeks   PT Treatment/Interventions Electrical Stimulation;Iontophoresis 4mg /ml Dexamethasone;Moist Heat;Ultrasound;Patient/family education;Passive range of motion;Manual techniques;Therapeutic exercise;Therapeutic activities;Dry needling;Taping   PT Next Visit Plan continue with TPDN; continue with manual therapy to the thoracic  spine. Continue with posterior chain strengthening; consider advancement to green band when able.    PT Home Exercise Plan posture awareness, stretching of spine, arm opening, decompression; thoracic side bend; posterior capsule; sink stretch, scpa retraction; shoulder extension; seated thoracic rotation, extension over chair. standing with UE flex bil and increasiing thoracic extension   Consulted and Agree with Plan of Care Patient      Patient will benefit from skilled therapeutic intervention in order to improve the following deficits and impairments:  Pain, Decreased activity tolerance, Postural dysfunction, Decreased strength, Decreased range of motion, Difficulty walking, Increased muscle spasms  Visit Diagnosis: Pain in thoracic spine  Muscle spasm of back  Muscle weakness (generalized)  Abnormal posture     Problem List Patient Active Problem List   Diagnosis Date Noted  . Malnutrition of moderate degree 07/19/2016  . Intra-abdominal abscess ([place]) 07/16/2016  . Cellulitis 07/15/2016  . S/P colostomy takedown 06/28/2016  . Pain in thoracic spine 06/09/2016  . Mid back pain 06/09/2016  . Postoperative fever 11/19/2015  . 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 Severe protein-calorie malnutrition ([place]) 11/17/2015  . Sepsis ([place]) 11/14/2015  . Hiccups 11/14/2015  . AKI (acute kidney injury) ([place]) 11/14/2015  . Fever   . Leg swelling   . Left shoulder pain   . Muscle spasm of left shoulder   . GI bleed 10/27/2015  . Acute blood loss anemia 10/27/2015  . Syncope 10/27/2015  . Hyperglycemia 10/27/2015  . Hypotension 10/27/2015  . Neck pain 10/27/2015  . Hematemesis 10/27/2015  . Hematochezia 10/27/2015  . Diverticulitis of colon with perforation 07/05/2015    Shanon Brow  Robb Matar PT DPT  12/17/2016, 11:14 AM  [facility] [address] [place], [name], [address] Phone: [phone]   Fax:  [phone]  Name: Miguel [name] MRN: [id number] Date of Birth: [birth year]

## 2016-12-21 ENCOUNTER — Ambulatory Visit: Payer: 59 | Admitting: Physical Therapy

## 2016-12-21 DIAGNOSIS — R293 Abnormal posture: Secondary | ICD-10-CM | POA: Diagnosis not present

## 2016-12-21 DIAGNOSIS — M546 Pain in thoracic spine: Secondary | ICD-10-CM

## 2016-12-21 DIAGNOSIS — M6281 Muscle weakness (generalized): Secondary | ICD-10-CM

## 2016-12-21 DIAGNOSIS — M62830 Muscle spasm of back: Secondary | ICD-10-CM

## 2016-12-21 NOTE — Therapy (Signed)
[place], [name], [address] Phone: [phone]   Fax:  [phone]  Physical Therapy Treatment  Patient Details  Name: Miguel [name] MRN: [id number] Date of Birth: 01-05-1951 Referring Provider: Jean Rosenthal, MD  Encounter Date: 12/21/2016      PT End of Session - 12/21/16 1529    Visit Number 14   Number of Visits 20   Date for PT Re-Evaluation 01/11/17   Authorization Type UMR / Medicare   PT Start Time 1017   PT Stop Time 1110   PT Time Calculation (min) 53 min   Activity Tolerance Patient tolerated treatment well   Behavior During Therapy [facility] for tasks assessed/performed      Past Medical History:  Diagnosis Date  . Anemia   . Anxiety   . BPH (benign prostatic hyperplasia)   . Gastritis   . GERD (gastroesophageal reflux disease)    "seldom" (07/16/2016)  . GI bleed due to NSAIDs 10/27/2015  . History of blood transfusion 06/2016   post OR/notes 07/15/2016  . History of hiatal hernia   . History of kidney stones   . Hyperlipidemia   . Hypertension   . Melanoma of back ([place])    "mid back"  . Sigmoid diverticulitis    with perforation    Past Surgical History:  Procedure Laterality Date  . COLON SURGERY     sigmoid  . COLOSTOMY TAKEDOWN N/A 06/28/2016   Procedure: COLOSTOMY TAKEDOWN;  Surgeon: [name], MD;  Location: [place];  Service: General;  Laterality: N/A;  . ESOPHAGOGASTRODUODENOSCOPY N/A 10/27/2015   Procedure: ESOPHAGOGASTRODUODENOSCOPY (EGD);  Surgeon: Clarene Essex, MD;  Location: [facility] ENDOSCOPY;  Service: Endoscopy;  Laterality: N/A;  . ESOPHAGOGASTRODUODENOSCOPY N/A 10/29/2015   Procedure: ESOPHAGOGASTRODUODENOSCOPY (EGD);  Surgeon: Ronald Lobo, MD;  Location: [facility] ENDOSCOPY;  Service: Endoscopy;  Laterality: N/A;  . ESOPHAGOGASTRODUODENOSCOPY N/A 10/30/2015   Procedure: ESOPHAGOGASTRODUODENOSCOPY (EGD);  Surgeon: Clarene Essex, MD;  Location: [facility] ENDOSCOPY;  Service:  Endoscopy;  Laterality: N/A;  . GASTROSTOMY TUBE PLACEMENT  11/21/2015   REDUCTION OF HIATAL HERNIA , REPAIR HIATAL HERNIA, RESECTION SMALL BOWEL WITH ANASTOMOSIS, PLACEMENT GASTROSTOMY TUBE, PLACEMENT DUODENOSTOMY TUBE (N/A)  . HEMORRHOID BANDING  X 2  . HERNIA REPAIR    . HIATAL HERNIA REPAIR N/A 11/21/2015   Procedure: REDUCTION OF HIATAL HERNIA , REPAIR HIATAL HERNIA, RESECTION SMALL BOWEL WITH ANASTOMOSIS, PLACEMENT GASTROSTOMY TUBE, PLACEMENT DUODENOSTOMY TUBE;  Surgeon: Mickeal Skinner, MD;  Location: [place];  Service: General;  Laterality: N/A;  . [place]  06/28/2016   open/notes 07/15/2016  . IR GUIDED DRAIN W CATHETER PLACEMENT  07/06/2016   /NOTES 07/15/2016  . IR RADIOLOGIST EVAL & MGMT  07/27/2016  . IR RADIOLOGIST EVAL & MGMT  08/17/2016  . IR RADIOLOGIST EVAL & MGMT  08/26/2016  . IR [place] TUBE PERCUT W/FLUORO  08/04/2016  . IR US GUIDE BX ASP/DRAIN  08/04/2016  . KNEE CARTILAGE SURGERY Right 1971   "opened me up"  . LAPAROTOMY N/A 07/05/2015   Procedure: PARTIAL SIGMOID COLECTOMY AND COLOSTOMY;  Surgeon: [name], MD;  Location: [place];  Service: General;  Laterality: N/A;  . MELANOMA EXCISION  2001  . 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  10/30/2015   Procedure: REMOVAL OF DISTAL STOMACH;  Surgeon: Judeth Horn, MD;  Location: [place];  Service: General;;  . REPAIR OF PERFORATED ULCER N/A 10/30/2015   Procedure: REPAIR OF BLEEDING  ULCER;  Surgeon: Jeneen Rinks  Hulen Skains, MD;  Location: [place];  Service: General;  Laterality: N/A;  . TUMOR EXCISION  2009   "back; fatty tumor"    There were no vitals filed for this visit.      Subjective Assessment - 12/21/16 1024    Subjective Patient is having mid to lower back pain this morning. He reports overall it has not been too bad. He has been getting wood ready for the winter.    Limitations Lifting;Standing;Walking;House hold activities   How long can you sit comfortably? 1 hour to go to  church   How long can you stand comfortably? 15 min   How long can you walk comfortably? maybe  1/2 mile   Diagnostic tests x ray: OA   Patient Stated Goals Get releif from pain   Currently in Pain? Yes   Pain Score 3    Pain Location Back   Pain Orientation Lower;Mid   Pain Descriptors / Indicators Aching   Pain Type Chronic pain   Pain Onset More than a month ago   Pain Frequency Constant   Aggravating Factors  lifting items    Pain Relieving Factors lying down    Effect of Pain on Daily Activities difficulty perfroming ADL;s                          [facility] Adult PT Treatment/Exercise - 12/21/16 0001      Lumbar Exercises: Stretches   Passive Hamstring Stretch Limitations hamstring stretch 2x20 sec hold 90/90; also reviewed in sitting    Lower Trunk Rotation Limitations x10    Piriformis Stretch Limitations 2x20 seconds bilateral      Lumbar Exercises: Standing   Other Standing Lumbar Exercises shoulder extension red 2x10; scpaular retraction 2x10;      Lumbar Exercises: Supine   Clam Limitations 2x10 green   Bent Knee Raise Limitations 2x10 with cuing for technique    Straight Leg Raises Limitations x10 bilateral    Other Supine Lumbar Exercises ball squeeze 2x10    Other Supine Lumbar Exercises thomas stretch 2x10      Moist Heat Therapy   Number Minutes Moist Heat 10 Minutes   Moist Heat Location Lumbar Spine     Manual Therapy   Manual Therapy Joint mobilization   Joint Mobilization Grade II and II PA glides in prone with illow to T3-T7'; Patient reportied improved pain after and feeling looser.    Soft tissue mobilization IASTM Thoracolumbar paraspinals , Myofascial release of abdominals on right                PT Education - 12/21/16 1529    Education provided Yes   Education Details reviewed HEP, reviewed core contraction    Person(s) Educated Patient   Methods Explanation;Demonstration;Tactile cues   Comprehension Verbalized  understanding;Returned demonstration;Verbal cues required;Tactile cues required          PT Short Term Goals - 12/08/16 1113      PT SHORT TERM GOAL #1   Title Pt will be indepenent in HEP as it has been established    Time 2   Period Weeks   Status Achieved     PT SHORT TERM GOAL #2   Title Pt will verbalize improvement in postural awareness with functional activities   Baseline is being more aware of his posture    Time 3   Period Weeks   Status Achieved     PT SHORT TERM GOAL #3  Title Pt will be able to sit for at least 20 min pain <=4/10   Time 3   Period Weeks   Status Achieved           PT Long Term Goals - 11/03/16 1237      PT LONG TERM GOAL #1   Title He will be independent with all HEp issued    Time 6   Period Weeks   Status New     PT LONG TERM GOAL #2   Title His back pain will become intermittant with activity on feet   Time 6   Period Weeks   Status New     PT LONG TERM GOAL #3   Title He will be able to be on feet for 30-45 min before pain starts   Time 6   Period Weeks   Status New     PT LONG TERM GOAL #4   Title He will be able to sit with no pain for 30 min   Time 6   Period Weeks   Status New     PT LONG TERM GOAL #5   Title FOTO score will improve to < 20% limited   Time 6   Period Weeks   Status New               Plan - 12/21/16 1533    Clinical Impression Statement Patient will be seen on Thursday. He will then go home and work on his HEP for a few weeks. If he feels like he is making progress he will continue with his HEP. Therapy reviewed complete lower back program today. He will review his mid thoracic exercises next visit.    Clinical Presentation Stable   Clinical Decision Making Low   Rehab Potential Good   PT Frequency 2x / week   PT Duration 6 weeks   PT Treatment/Interventions Electrical Stimulation;Iontophoresis 4mg /ml Dexamethasone;Moist Heat;Ultrasound;Patient/family education;Passive range of  motion;Manual techniques;Therapeutic exercise;Therapeutic activities;Dry needling;Taping   PT Next Visit Plan continue with TPDN; continue with manual therapy to the thoracic spine. Continue with posterior chain strengthening; consider advancement to green band when able.    PT Home Exercise Plan posture awareness, stretching of spine, arm opening, decompression; thoracic side bend; posterior capsule; sink stretch, scpa retraction; shoulder extension; seated thoracic rotation, extension over chair. standing with UE flex bil and increasiing thoracic extension   Consulted and Agree with Plan of Care Patient      Patient will benefit from skilled therapeutic intervention in order to improve the following deficits and impairments:  Pain, Decreased activity tolerance, Postural dysfunction, Decreased strength, Decreased range of motion, Difficulty walking, Increased muscle spasms  Visit Diagnosis: Pain in thoracic spine  Muscle spasm of back  Muscle weakness (generalized)  Abnormal posture     Problem List Patient Active Problem List   Diagnosis Date Noted  . Malnutrition of moderate degree 07/19/2016  . Intra-abdominal abscess ([place]) 07/16/2016  . Cellulitis 07/15/2016  . S/P colostomy takedown 06/28/2016  . Pain in thoracic spine 06/09/2016  . Mid back pain 06/09/2016  . Postoperative fever 11/19/2015  . 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 Severe protein-calorie malnutrition ([place]) 11/17/2015  . Sepsis ([place]) 11/14/2015  . Hiccups 11/14/2015  . AKI (acute kidney injury) ([place]) 11/14/2015  . Fever   . Leg swelling   . Left shoulder pain   . Muscle spasm of left shoulder   . GI bleed 10/27/2015  . Acute blood loss anemia 10/27/2015  .  Syncope 10/27/2015  . Hyperglycemia 10/27/2015  . Hypotension 10/27/2015  . Neck pain 10/27/2015  . Hematemesis 10/27/2015  . Hematochezia 10/27/2015  . Diverticulitis of colon with perforation 07/05/2015    Carney Living PT DPT  12/21/2016,  3:35 PM  [facility] [address] [place], [name], [address] Phone: [phone]   Fax:  [phone]  Name: Miguel [name] MRN: [id number] Date of Birth: [birth year]

## 2016-12-23 ENCOUNTER — Ambulatory Visit: Payer: 59 | Admitting: Physical Therapy

## 2016-12-23 ENCOUNTER — Encounter: Payer: Self-pay | Admitting: Physical Therapy

## 2016-12-23 DIAGNOSIS — R293 Abnormal posture: Secondary | ICD-10-CM

## 2016-12-23 DIAGNOSIS — M62830 Muscle spasm of back: Secondary | ICD-10-CM

## 2016-12-23 DIAGNOSIS — M546 Pain in thoracic spine: Secondary | ICD-10-CM

## 2016-12-23 DIAGNOSIS — M6281 Muscle weakness (generalized): Secondary | ICD-10-CM

## 2016-12-23 NOTE — Therapy (Addendum)
[name], [name], [address] Phone: [phone]   Fax:  [phone]  Physical Therapy Treatment/ discharge   Patient Details  Name: Miguel [name] MRN: [id number] Date of Birth: [birth year] Referring Provider: Jean Rosenthal, MD  Encounter Date: 12/23/2016      PT End of Session - 12/23/16 1025    Visit Number 15   Number of Visits 20   Date for PT Re-Evaluation 01/11/17   Authorization Type UMR / Medicare   PT Start Time 1018   PT Stop Time 1110   PT Time Calculation (min) 52 min   Activity Tolerance Patient tolerated treatment well   Behavior During Therapy [facility] for tasks assessed/performed      Past Medical History:  Diagnosis Date  . Anemia   . Anxiety   . BPH (benign prostatic hyperplasia)   . Gastritis   . GERD (gastroesophageal reflux disease)    "seldom" (07/16/2016)  . GI bleed due to NSAIDs 10/27/2015  . History of blood transfusion 06/2016   post OR/notes 07/15/2016  . History of hiatal hernia   . History of kidney stones   . Hyperlipidemia   . Hypertension   . Melanoma of back ([place])    "mid back"  . Sigmoid diverticulitis    with perforation    Past Surgical History:  Procedure Laterality Date  . COLON SURGERY     sigmoid  . COLOSTOMY TAKEDOWN N/A 06/28/2016   Procedure: COLOSTOMY TAKEDOWN;  Surgeon: [name], MD;  Location: [place];  Service: General;  Laterality: N/A;  . ESOPHAGOGASTRODUODENOSCOPY N/A 10/27/2015   Procedure: ESOPHAGOGASTRODUODENOSCOPY (EGD);  Surgeon: Clarene Essex, MD;  Location: [facility] ENDOSCOPY;  Service: Endoscopy;  Laterality: N/A;  . ESOPHAGOGASTRODUODENOSCOPY N/A 10/29/2015   Procedure: ESOPHAGOGASTRODUODENOSCOPY (EGD);  Surgeon: Ronald Lobo, MD;  Location: [facility] ENDOSCOPY;  Service: Endoscopy;  Laterality: N/A;  . ESOPHAGOGASTRODUODENOSCOPY N/A 10/30/2015   Procedure: ESOPHAGOGASTRODUODENOSCOPY (EGD);  Surgeon: Clarene Essex, MD;  Location[facility]  ENDOSCOPY;  Service: Endoscopy;  Laterality: N/A;  . GASTROSTOMY TUBE PLACEMENT  11/21/2015   REDUCTION OF HIATAL HERNIA , REPAIR HIATAL HERNIA, RESECTION SMALL BOWEL WITH ANASTOMOSIS, PLACEMENT GASTROSTOMY TUBE, PLACEMENT DUODENOSTOMY TUBE (N/A)  . HEMORRHOID BANDING  X 2  . HERNIA REPAIR    . HIATAL HERNIA REPAIR N/A 11/21/2015   Procedure: REDUCTION OF HIATAL HERNIA , REPAIR HIATAL HERNIA, RESECTION SMALL BOWEL WITH ANASTOMOSIS, PLACEMENT GASTROSTOMY TUBE, PLACEMENT DUODENOSTOMY TUBE;  Surgeon: Mickeal Skinner, MD;  Location: [place];  Service: General;  Laterality: N/A;  . [place]  06/28/2016   open/notes 07/15/2016  . IR GUIDED DRAIN W CATHETER PLACEMENT  07/06/2016   /NOTES 07/15/2016  . IR RADIOLOGIST EVAL & MGMT  07/27/2016  . IR RADIOLOGIST EVAL & MGMT  08/17/2016  . IR RADIOLOGIST EVAL & MGMT  08/26/2016  . IR [place] TUBE PERCUT W/FLUORO  08/04/2016  . IR US GUIDE BX ASP/DRAIN  08/04/2016  . KNEE CARTILAGE SURGERY Right 1971   "opened me up"  . LAPAROTOMY N/A 07/05/2015   Procedure: PARTIAL SIGMOID COLECTOMY AND COLOSTOMY;  Surgeon: [name], MD;  Location: [place];  Service: General;  Laterality: N/A;  . MELANOMA EXCISION  2001  . 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  10/30/2015   Procedure: REMOVAL OF DISTAL STOMACH;  Surgeon: Judeth Horn, MD;  Location: [place];  Service: General;;  . REPAIR OF PERFORATED ULCER N/A 10/30/2015   Procedure: REPAIR OF BLEEDING  ULCER;  Surgeon: Judeth Horn, MD;  Location: [place];  Service: General;  Laterality: N/A;  . TUMOR EXCISION  2009   "back; fatty tumor"    There were no vitals filed for this visit.      Subjective Assessment - 12/23/16 1716    Subjective Patient continues to have mid back pain. He reports he was doing more wood yesterday. Therapy will review mid back stretching.    Limitations Lifting;Standing;Walking;House hold activities   How long can you sit comfortably? 1 hour to go to  church   How long can you stand comfortably? 15 min   How long can you walk comfortably? maybe  1/2 mile   Diagnostic tests x ray: OA   Patient Stated Goals Get releif from pain   Currently in Pain? Yes   Pain Score 3    Pain Location Back   Pain Orientation Mid;Lower   Pain Descriptors / Indicators Aching   Pain Type Chronic pain   Pain Onset More than a month ago   Pain Frequency Constant   Aggravating Factors  lifting items    Pain Relieving Factors lying down    Effect of Pain on Daily Activities difficulty perfroming ADL's                          [facility] Adult PT Treatment/Exercise - 12/23/16 0001      Lumbar Exercises: Standing   Other Standing Lumbar Exercises shoulder extension red 2x10; scpaular retraction 2x10;    Other Standing Lumbar Exercises pallof press 2x10 green; chop red 2x10 with cuing      Shoulder Exercises: Stretch   Other Shoulder Stretches throacic side stretch at the wall billateral 5x 10 seconds; posterior capsule stretch: reported some pain. Thoracic extension stretch 2x20 sec hold     Moist Heat Therapy   Number Minutes Moist Heat 10 Minutes   Moist Heat Location Lumbar Spine     Manual Therapy   Manual Therapy Joint mobilization   Joint Mobilization Grade II and II PA glides in prone with illow to T3-T7'; Patient reportied improved pain after and feeling looser.    Soft tissue mobilization IASTM Thoracolumbar paraspinals , Myofascial release of abdominals on right                PT Education - 12/23/16 1729    Education provided Yes   Education Details reviewed total HEP; Reviewed symptom mangement at home   Person(s) Educated Patient   Methods Explanation;Demonstration;Tactile cues   Comprehension Verbalized understanding;Returned demonstration;Verbal cues required;Tactile cues required          PT Short Term Goals - 12/23/16 1737      PT SHORT TERM GOAL #1   Title Pt will be indepenent in HEP as it has been  established    Time 2   Period Weeks   Status Achieved     PT SHORT TERM GOAL #2   Title Pt will verbalize improvement in postural awareness with functional activities   Baseline is being more aware of his posture    Time 3   Period Weeks   Status Achieved     PT SHORT TERM GOAL #3   Title Pt will be able to sit for at least 20 min pain <=4/10   Baseline Is having very little pain sitting    Time 3   Period Weeks   Status Achieved           PT  Long Term Goals - 12/23/16 1739      PT LONG TERM GOAL #1   Title He will be independent with all HEp issued    Baseline independent with complete HEP    Time 6   Period Weeks   Status Achieved     PT LONG TERM GOAL #2   Title His back pain will become intermittant with activity on feet   Time 6   Period Weeks   Status Achieved     PT LONG TERM GOAL #3   Title He will be able to be on feet for 30-45 min before pain starts   Baseline can perfrom 2-3 hours of work before pain begins    Time 6   Period Weeks   Status Achieved     PT LONG TERM GOAL #4   Title He will be able to sit with no pain for 30 min   Baseline No pain sitting    Time 6   Period Weeks   Status Achieved     PT LONG TERM GOAL #5   Title FOTO score will improve to < 20% limited   Baseline 30% limited. Has not reached goal but reached FOTO goal    Time 6   Period Weeks   Status Achieved               Plan - 12/23/16 1731    Clinical Impression Statement Patient will likley D/C to HEP. He has stretches and exercises for his upper and lower back. His pain seems to be corelated to his activity level but it does not last. He was advised to use his stretches and strengthening as needed. He has reached goals for therapy. The patient would like to come back for a follow up visit if needed. His case will be left open for 2-3 weeks.    Clinical Presentation Stable   Clinical Presentation due to: chronic pain    Clinical Decision Making Low   Rehab  Potential Good   PT Frequency 2x / week   PT Duration 6 weeks   PT Treatment/Interventions Electrical Stimulation;Iontophoresis 10m/ml Dexamethasone;Moist Heat;Ultrasound;Patient/family education;Passive range of motion;Manual techniques;Therapeutic exercise;Therapeutic activities;Dry needling;Taping   PT Next Visit Plan continue with TPDN; continue with manual therapy to the thoracic spine. Continue with posterior chain strengthening; consider advancement to green band when able.    PT Home Exercise Plan posture awareness, stretching of spine, arm opening, decompression; thoracic side bend; posterior capsule; sink stretch, scpa retraction; shoulder extension; seated thoracic rotation, extension over chair. standing with UE flex bil and increasiing thoracic extension   Consulted and Agree with Plan of Care Patient      Patient will benefit from skilled therapeutic intervention in order to improve the following deficits and impairments:  Pain, Decreased activity tolerance, Postural dysfunction, Decreased strength, Decreased range of motion, Difficulty walking, Increased muscle spasms  Visit Diagnosis: Pain in thoracic spine  Muscle spasm of back  Muscle weakness (generalized)  Abnormal posture     Problem List Patient Active Problem List   Diagnosis Date Noted  . Malnutrition of moderate degree 07/19/2016  . [place]dominal abscess ([place] 07/16/2016  . Cellulitis 07/15/2016  . S/P colostomy takedown 06/28/2016  . Pain in thoracic spine 06/09/2016  . Mid back pain 06/09/2016  . Postoperative fever 11/19/2015  . 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 Severe protein-calorie malnutrition ([place] 11/17/2015  . Sepsis ([place] 11/14/2015  . Hiccups 11/14/2015  . AKI (acute kidney injury) ([place] 11/14/2015  .  Fever   . Leg swelling   . Left shoulder pain   . Muscle spasm of left shoulder   . GI bleed 10/27/2015  . Acute blood loss anemia 10/27/2015  . Syncope 10/27/2015  . Hyperglycemia  10/27/2015  . Hypotension 10/27/2015  . Neck pain 10/27/2015  . Hematemesis 10/27/2015  . Hematochezia 10/27/2015  . Diverticulitis of colon with perforation 07/05/2015    PHYSICAL THERAPY DISCHARGE SUMMARY  Visits from Start of Care: 15  Current functional level related to goals / functional outcomes: Significant improvement in pain and function    Remaining deficits: Pain at times with moderate to heavy activity    Education / Equipment: HEP  Plan: Patient agrees to discharge.  Patient goals were not met. Patient is being discharged due to not returning since the last visit.  ?????       Carney Living PT DPT  12/23/2016, 9:17 PM  [facility] [address] [place], [name], [address] Phone: [phone]   Fax:  [phone]  Name: TOURE EDMONDS MRN: [id number] Date of Birth: [birth year]

## 2016-12-27 MED FILL — TAMSULOSIN HCL 0.4 MG CAP: 0.4 | 30 days supply | Qty: 30 | Fill #7

## 2016-12-28 DIAGNOSIS — N179 Acute kidney failure, unspecified: Secondary | ICD-10-CM | POA: Diagnosis not present

## 2016-12-28 DIAGNOSIS — K5720 Diverticulitis of large intestine with perforation and abscess without bleeding: Secondary | ICD-10-CM | POA: Diagnosis not present

## 2017-01-03 ENCOUNTER — Ambulatory Visit (INDEPENDENT_AMBULATORY_CARE_PROVIDER_SITE_OTHER): Payer: 59 | Admitting: Orthopaedic Surgery

## 2017-01-03 DIAGNOSIS — M65322 Trigger finger, left index finger: Secondary | ICD-10-CM

## 2017-01-03 DIAGNOSIS — M65311 Trigger thumb, right thumb: Secondary | ICD-10-CM

## 2017-01-03 DIAGNOSIS — M65312 Trigger thumb, left thumb: Secondary | ICD-10-CM | POA: Diagnosis not present

## 2017-01-03 DIAGNOSIS — M65321 Trigger finger, right index finger: Secondary | ICD-10-CM

## 2017-01-03 MED ORDER — METHYLPREDNISOLONE ACETATE 40 MG/ML IJ SUSP
40.0000 mg | INTRAMUSCULAR | Status: AC | PRN
  Administered 2017-01-03: 40 mg

## 2017-01-03 MED ORDER — LIDOCAINE HCL 1 % IJ SOLN
1.0000 mL | INTRAMUSCULAR | Status: AC | PRN
Start: 2017-01-03 — End: 2017-01-03
  Administered 2017-01-03: 1 mL

## 2017-01-03 MED ORDER — LIDOCAINE HCL 1 % IJ SOLN
1.0000 mL | INTRAMUSCULAR | Status: AC | PRN
  Administered 2017-01-03: 1 mL

## 2017-01-03 NOTE — Progress Notes (Signed)
Office Visit Note   Patient: Miguel [name]           Date of Birth: 1950/05/22           MRN: [id number] Visit Date: 01/03/2017              Requested by: Alroy Dust, L.Marlou Sa, [place] [employer] [place], [place] [address] PCP: Alroy Dust, L.Marlou Sa, MD   Assessment & Plan: Visit Diagnoses:  1. Trigger finger of left thumb   2. Trigger finger of right thumb   3. Trigger index finger of left hand   4. Trigger index finger of right hand     Plan: He is not a diabetic. I agree with trying injections in both thumbs and both index fingers with a half cc of a steroid half cc lidocaine. He's had this before. He tolerated injections well. I'll see him back in a month to see how is doing overall.  Follow-Up Instructions: Return in about 4 weeks (around 01/31/2017).   Orders:  No orders of the defined types were placed in this encounter.  No orders of the defined types were placed in this encounter.     Procedures: Hand/UE Inj Date/Time: 01/03/2017 2:32 PM Performed by: Mcarthur Rossetti Authorized by: Mcarthur Rossetti   Condition: trigger finger   Location:  Thumb Site:  R thumb A1 Medications:  1 mL lidocaine 1 %; 40 mg methylPREDNISolone acetate 40 MG/ML Hand/UE Inj Date/Time: 01/03/2017 2:33 PM Performed by: Mcarthur Rossetti Authorized by: Mcarthur Rossetti   Condition: trigger finger   Location:  Thumb Site:  L thumb A1 Medications:  1 mL lidocaine 1 %; 40 mg methylPREDNISolone acetate 40 MG/ML Hand/UE Inj Date/Time: 01/03/2017 2:33 PM Performed by: Mcarthur Rossetti Authorized by: Mcarthur Rossetti   Condition: trigger finger   Location:  Index finger Site:  R index A1 Medications:  40 mg methylPREDNISolone acetate 40 MG/ML; 1 mL lidocaine 1 % Hand/UE Inj Date/Time: 01/03/2017 2:34 PM Performed by: Mcarthur Rossetti Authorized by: Mcarthur Rossetti   Condition: trigger finger   Location:  Index  finger Site:  L index A1 Medications:  1 mL lidocaine 1 %; 40 mg methylPREDNISolone acetate 40 MG/ML     Clinical Data: No additional findings.   Subjective: No chief complaint on file. The patient comes in with chief complaint of pain in both thumbs in both index fingers as well as active triggering in both index fingers and both thumbs.  HPI  Review of Systems He denies any headache, chest pain, shortness of breath, fever, chills, nausea, vomiting.  Objective: Vital Signs: There were no vitals taken for this visit.  Physical Exam He is alert and oriented 3 and in no acute distress Ortho Exam Examination of both hand shows active triggering and pain over the A1 pulley of his left thumb, left index finger, right thumb, and right index finger. He is neurovascularly intact. He has good pinch and grip strength bilaterally.  Specialty Comments:  No specialty comments available.  Imaging: No results found.   PMFS History: Patient Active Problem List   Diagnosis Date Noted  . Trigger finger of left thumb 01/03/2017  . Trigger finger of right thumb 01/03/2017  . Trigger index finger of left hand 01/03/2017  . Trigger index finger of right hand 01/03/2017  . Malnutrition of moderate degree 07/19/2016  . Intra-abdominal abscess ([place]) 07/16/2016  . Cellulitis 07/15/2016  . S/P colostomy takedown 06/28/2016  . Pain  in thoracic spine 06/09/2016  . Mid back pain 06/09/2016  . Postoperative fever 11/19/2015  . 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 Severe protein-calorie malnutrition ([place]) 11/17/2015  . Sepsis ([place]) 11/14/2015  . Hiccups 11/14/2015  . AKI (acute kidney injury) ([place]) 11/14/2015  . Fever   . Leg swelling   . Left shoulder pain   . Muscle spasm of left shoulder   . GI bleed 10/27/2015  . Acute blood loss anemia 10/27/2015  . Syncope 10/27/2015  . Hyperglycemia 10/27/2015  . Hypotension 10/27/2015  . Neck pain 10/27/2015  . Hematemesis 10/27/2015  .  Hematochezia 10/27/2015  . Diverticulitis of colon with perforation 07/05/2015   Past Medical History:  Diagnosis Date  . Anemia   . Anxiety   . BPH (benign prostatic hyperplasia)   . Gastritis   . GERD (gastroesophageal reflux disease)    "seldom" (07/16/2016)  . GI bleed due to NSAIDs 10/27/2015  . History of blood transfusion 06/2016   post OR/notes 07/15/2016  . History of hiatal hernia   . History of kidney stones   . Hyperlipidemia   . Hypertension   . Melanoma of back ([place])    "mid back"  . Sigmoid diverticulitis    with perforation    Family History  Problem Relation Age of Onset  . Stroke Mother   . Stroke Brother   . Heart disease Brother     Past Surgical History:  Procedure Laterality Date  . COLON SURGERY     sigmoid  . COLOSTOMY TAKEDOWN N/A 06/28/2016   Procedure: COLOSTOMY TAKEDOWN;  Surgeon: [name], MD;  Location: [place];  Service: General;  Laterality: N/A;  . ESOPHAGOGASTRODUODENOSCOPY N/A 10/27/2015   Procedure: ESOPHAGOGASTRODUODENOSCOPY (EGD);  Surgeon: Clarene Essex, MD;  Location[facility] ENDOSCOPY;  Service: Endoscopy;  Laterality: N/A;  . ESOPHAGOGASTRODUODENOSCOPY N/A 10/29/2015   Procedure: ESOPHAGOGASTRODUODENOSCOPY (EGD);  Surgeon: Ronald Lobo, MD;  Location[facility] ENDOSCOPY;  Service: Endoscopy;  Laterality: N/A;  . ESOPHAGOGASTRODUODENOSCOPY N/A 10/30/2015   Procedure: ESOPHAGOGASTRODUODENOSCOPY (EGD);  Surgeon: Clarene Essex, MD;  Location[facility] ENDOSCOPY;  Service: Endoscopy;  Laterality: N/A;  . GASTROSTOMY TUBE PLACEMENT  11/21/2015   REDUCTION OF HIATAL HERNIA , REPAIR HIATAL HERNIA, RESECTION SMALL BOWEL WITH ANASTOMOSIS, PLACEMENT GASTROSTOMY TUBE, PLACEMENT DUODENOSTOMY TUBE (N/A)  . HEMORRHOID BANDING  X 2  . HERNIA REPAIR    . HIATAL HERNIA REPAIR N/A 11/21/2015   Procedure: REDUCTION OF HIATAL HERNIA , REPAIR HIATAL HERNIA, RESECTION SMALL BOWEL WITH ANASTOMOSIS, PLACEMENT GASTROSTOMY TUBE, PLACEMENT DUODENOSTOMY TUBE;  Surgeon: Mickeal Skinner, MD;  Location: [place];  Service: General;  Laterality: N/A;  . [place]  06/28/2016   open/notes 07/15/2016  . IR GUIDED DRAIN W CATHETER PLACEMENT  07/06/2016   /NOTES 07/15/2016  . IR RADIOLOGIST EVAL & MGMT  07/27/2016  . IR RADIOLOGIST EVAL & MGMT  08/17/2016  . IR RADIOLOGIST EVAL & MGMT  08/26/2016  . IR [place] TUBE PERCUT W/FLUORO  08/04/2016  . IR US GUIDE BX ASP/DRAIN  08/04/2016  . KNEE CARTILAGE SURGERY Right 1971   "opened me up"  . LAPAROTOMY N/A 07/05/2015   Procedure: PARTIAL SIGMOID COLECTOMY AND COLOSTOMY;  Surgeon: [name], MD;  Location: [place];  Service: General;  Laterality: N/A;  . MELANOMA EXCISION  2001  . 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  10/30/2015   Procedure: REMOVAL OF DISTAL STOMACH;  Surgeon: Judeth Horn, MD;  Location: [place];  Service: General;;  . REPAIR  OF PERFORATED ULCER N/A 10/30/2015   Procedure: REPAIR OF BLEEDING  ULCER;  Surgeon: Judeth Horn, MD;  Location: [place];  Service: General;  Laterality: N/A;  . TUMOR EXCISION  2009   "back; fatty tumor"   Social History   Occupational History  . unable to work since April    Social History Main Topics  . Smoking status: Never Smoker  . Smokeless tobacco: Former Systems developer    Types: Snuff    Quit date: 07/13/2016  . Alcohol use No  . Drug use: No  . Sexual activity: Yes

## 2017-01-07 DIAGNOSIS — R112 Nausea with vomiting, unspecified: Secondary | ICD-10-CM | POA: Diagnosis not present

## 2017-01-11 ENCOUNTER — Other Ambulatory Visit: Payer: Self-pay | Admitting: Surgery

## 2017-01-11 DIAGNOSIS — Z9884 Bariatric surgery status: Secondary | ICD-10-CM

## 2017-01-12 ENCOUNTER — Other Ambulatory Visit (HOSPITAL_COMMUNITY): Payer: Self-pay | Admitting: Surgery

## 2017-01-12 DIAGNOSIS — Z9884 Bariatric surgery status: Secondary | ICD-10-CM

## 2017-01-13 ENCOUNTER — Emergency Department (HOSPITAL_COMMUNITY)
Admission: EM | Admit: 2017-01-13 | Discharge: 2017-01-13 | Disposition: A | Discharge status: Home / Self Care | Payer: 59 | Attending: Emergency Medicine | Admitting: Emergency Medicine

## 2017-01-13 ENCOUNTER — Encounter (HOSPITAL_COMMUNITY): Payer: Self-pay

## 2017-01-13 ENCOUNTER — Emergency Department (HOSPITAL_COMMUNITY): Payer: 59

## 2017-01-13 DIAGNOSIS — Z4659 Encounter for fitting and adjustment of other gastrointestinal appliance and device: Secondary | ICD-10-CM

## 2017-01-13 DIAGNOSIS — Z7982 Long term (current) use of aspirin: Secondary | ICD-10-CM | POA: Diagnosis not present

## 2017-01-13 DIAGNOSIS — I10 Essential (primary) hypertension: Secondary | ICD-10-CM | POA: Insufficient documentation

## 2017-01-13 DIAGNOSIS — Z4682 Encounter for fitting and adjustment of non-vascular catheter: Secondary | ICD-10-CM | POA: Diagnosis not present

## 2017-01-13 DIAGNOSIS — Z87891 Personal history of nicotine dependence: Secondary | ICD-10-CM | POA: Insufficient documentation

## 2017-01-13 DIAGNOSIS — K279 Peptic ulcer, site unspecified, unspecified as acute or chronic, without hemorrhage or perforation: Secondary | ICD-10-CM | POA: Diagnosis not present

## 2017-01-13 DIAGNOSIS — Z79899 Other long term (current) drug therapy: Secondary | ICD-10-CM | POA: Insufficient documentation

## 2017-01-13 DIAGNOSIS — Z431 Encounter for attention to gastrostomy: Secondary | ICD-10-CM | POA: Insufficient documentation

## 2017-01-13 MED ORDER — IOPAMIDOL (ISOVUE-300) INJECTION 61%
INTRAVENOUS | Status: AC
  Filled 2017-01-13: qty 30

## 2017-01-13 MED ORDER — IOPAMIDOL (ISOVUE-300) INJECTION 61%
30.0000 mL | Freq: Once | INTRAVENOUS | Status: AC | PRN
  Administered 2017-01-13: 30 mL via ORAL

## 2017-01-13 NOTE — Discharge Instructions (Signed)
Use catheter placed for feeding until you can be seen by your doctor for replacement with your usual feeding tube.

## 2017-01-13 NOTE — ED Notes (Signed)
Bed: WA07 Expected date:  Expected time:  Means of arrival:  Comments: 

## 2017-01-13 NOTE — ED Provider Notes (Signed)
[facility] DEPT Provider Note   CSN: [id number] Arrival date & time: 01/13/17  1725     History   Chief Complaint Chief Complaint  Patient presents with  . feeding tube fell out    HPI Miguel [name] is a 67 y.o. male.  HPI Patient reports that about 4:30 his feeding tube fell out. No other associated symptoms. It had been in place for approximately a year. Past Medical History:  Diagnosis Date  . Anemia   . Anxiety   . BPH (benign prostatic hyperplasia)   . Gastritis   . GERD (gastroesophageal reflux disease)    "seldom" (07/16/2016)  . GI bleed due to NSAIDs 10/27/2015  . History of blood transfusion 06/2016   post OR/notes 07/15/2016  . History of hiatal hernia   . History of kidney stones   . Hyperlipidemia   . Hypertension   . Melanoma of back ([place])    "mid back"  . Sigmoid diverticulitis    with perforation    Patient Active Problem List   Diagnosis Date Noted  . Trigger finger of left thumb 01/03/2017  . Trigger finger of right thumb 01/03/2017  . Trigger index finger of left hand 01/03/2017  . Trigger index finger of right hand 01/03/2017  . Malnutrition of moderate degree 07/19/2016  . Intra-abdominal abscess ([place]) 07/16/2016  . Cellulitis 07/15/2016  . S/P colostomy takedown 06/28/2016  . Pain in thoracic spine 06/09/2016  . Mid back pain 06/09/2016  . Postoperative fever 11/19/2015  . 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 Severe protein-calorie malnutrition ([place]) 11/17/2015  . Sepsis ([place]) 11/14/2015  . Hiccups 11/14/2015  . AKI (acute kidney injury) ([place]) 11/14/2015  . Fever   . Leg swelling   . Left shoulder pain   . Muscle spasm of left shoulder   . GI bleed 10/27/2015  . Acute blood loss anemia 10/27/2015  . Syncope 10/27/2015  . Hyperglycemia 10/27/2015  . Hypotension 10/27/2015  . Neck pain 10/27/2015  . Hematemesis 10/27/2015  . Hematochezia 10/27/2015  . Diverticulitis of colon with perforation  07/05/2015    Past Surgical History:  Procedure Laterality Date  . COLON SURGERY     sigmoid  . COLOSTOMY TAKEDOWN N/A 06/28/2016   Procedure: COLOSTOMY TAKEDOWN;  Surgeon: [name], MD;  Location: [facility];  Service: General;  Laterality: N/A;  . ESOPHAGOGASTRODUODENOSCOPY N/A 10/27/2015   Procedure: ESOPHAGOGASTRODUODENOSCOPY (EGD);  Surgeon: Clarene Essex, MD;  Location[facility] ENDOSCOPY;  Service: Endoscopy;  Laterality: N/A;  . ESOPHAGOGASTRODUODENOSCOPY N/A 10/29/2015   Procedure: ESOPHAGOGASTRODUODENOSCOPY (EGD);  Surgeon: Ronald Lobo, MD;  Location: [facility] ENDOSCOPY;  Service: Endoscopy;  Laterality: N/A;  . ESOPHAGOGASTRODUODENOSCOPY N/A 10/30/2015   Procedure: ESOPHAGOGASTRODUODENOSCOPY (EGD);  Surgeon: Clarene Essex, MD;  Location: [facility] ENDOSCOPY;  Service: Endoscopy;  Laterality: N/A;  . GASTROSTOMY TUBE PLACEMENT  11/21/2015   REDUCTION OF HIATAL HERNIA , REPAIR HIATAL HERNIA, RESECTION SMALL BOWEL WITH ANASTOMOSIS, PLACEMENT GASTROSTOMY TUBE, PLACEMENT DUODENOSTOMY TUBE (N/A)  . HEMORRHOID BANDING  X 2  . HERNIA REPAIR    . HIATAL HERNIA REPAIR N/A 11/21/2015   Procedure: REDUCTION OF HIATAL HERNIA , REPAIR HIATAL HERNIA, RESECTION SMALL BOWEL WITH ANASTOMOSIS, PLACEMENT GASTROSTOMY TUBE, PLACEMENT DUODENOSTOMY TUBE;  Surgeon: Mickeal Skinner, MD;  Location: [facility];  Service: General;  Laterality: N/A;  . [place]  06/28/2016   open/notes 07/15/2016  . IR GUIDED DRAIN W CATHETER PLACEMENT  07/06/2016   /NOTES 07/15/2016  . IR RADIOLOGIST EVAL & MGMT  07/27/2016  . IR RADIOLOGIST EVAL & MGMT  08/17/2016  . IR RADIOLOGIST EVAL & MGMT  08/26/2016  . IR [place] TUBE PERCUT W/FLUORO  08/04/2016  . IR US GUIDE BX ASP/DRAIN  08/04/2016  . KNEE CARTILAGE SURGERY Right 1971   "opened me up"  . LAPAROTOMY N/A 07/05/2015   Procedure: PARTIAL SIGMOID COLECTOMY AND COLOSTOMY;  Surgeon: [name], MD;  Location: [facility];  Service: General;  Laterality: N/A;  . MELANOMA  EXCISION  2001  . 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  10/30/2015   Procedure: REMOVAL OF DISTAL STOMACH;  Surgeon: Judeth Horn, MD;  Location: [facility];  Service: General;;  . REPAIR OF PERFORATED ULCER N/A 10/30/2015   Procedure: REPAIR OF BLEEDING  ULCER;  Surgeon: Judeth Horn, MD;  Location: [facility];  Service: General;  Laterality: N/A;  . TUMOR EXCISION  2009   "back; fatty tumor"       Home Medications    Prior to Admission medications   Medication Sig Start Date End Date Taking? Authorizing Provider  acetaminophen (TYLENOL) 500 MG tablet Take 1 tablet (500 mg total) by mouth every 8 (eight) hours as needed for mild pain (for pain). Patient taking differently: Take 500 mg by mouth every 8 (eight) hours as needed for mild pain or fever.  12/05/15  Yes Jill Alexanders, PA-C  aspirin EC 81 MG tablet Take 81 mg by mouth daily after supper.    Yes [provider]  diclofenac sodium (VOLTAREN) 1 % GEL Apply 1 application topically 2 (two) times daily as needed (back and shoulder pain).   Yes [provider]  famotidine (PEPCID) 20 MG tablet Take 1 tablet (20 mg total) by mouth 2 (two) times daily. 11/09/15  Yes Elwin Mocha, MD  methocarbamol (ROBAXIN) 500 MG tablet Take 500 mg by mouth 3 (three) times daily as needed. 12/23/16  Yes [provider]  Multiple Vitamin (MULTIVITAMIN WITH MINERALS) TABS tablet Take 1 tablet by mouth daily.   Yes [provider]  Nutritional Supplements (FEEDING SUPPLEMENT, OSMOLITE 1.5 CAL,) LIQD Place 1,000 mLs into feeding tube continuous.   Yes [provider]  NYSTATIN powder Apply 1 application topically daily. 01/03/17  Yes [provider]  Omega-3 Fatty Acids (FISH OIL) 1200 MG CAPS Take 1,200 mg by mouth daily.    Yes [provider]  oxyCODONE (OXY IR/ROXICODONE) 5 MG immediate release tablet Take 5 mg by mouth every 4 (four) hours as needed.  10/02/16  Yes [provider]  Probiotic Product (ALIGN) 4 MG CAPS Take 4 mg by mouth daily.    Yes [provider]  promethazine (PHENERGAN) 25 MG tablet Take 25 mg by mouth every 4 (four) hours as needed. 10/21/16  Yes [provider]  tamsulosin (FLOMAX) 0.4 MG CAPS capsule Take 1 capsule (0.4 mg total) by mouth daily. 01/04/16  Yes Fanny Skates, MD  traMADol (ULTRAM) 50 MG tablet Take 50 mg by mouth every 6 (six) hours as needed for moderate pain.  05/21/16  Yes [provider]  zolpidem (AMBIEN) 10 MG tablet Take 5-10 mg by mouth at bedtime as needed for sleep.    Yes [provider]  HYDROcodone-acetaminophen (NORCO/VICODIN) 5-325 MG tablet Take 1-2 tablets by mouth every 4 (four) hours as needed for moderate pain. Patient not taking: Reported on 01/13/2017 07/13/16   [name], MD  HYDROcodone-acetaminophen (NORCO/VICODIN) 5-325 MG tablet Take 1-2 tablets by mouth every 4 (four)  hours as needed for moderate pain. Patient not taking: Reported on 01/13/2017 08/07/16   Fanny Skates, MD  sodium chloride flush (NS) 0.9 % SOLN 10 mLs by Other route daily. Patient not taking: Reported on 01/13/2017 07/21/16   Jill Alexanders, PA-C    Family History Family History  Problem Relation Age of Onset  . Stroke Mother   . Stroke Brother   . Heart disease Brother     Social History Social History  Substance Use Topics  . Smoking status: Never Smoker  . Smokeless tobacco: Former Systems developer    Types: Snuff    Quit date: 07/13/2016  . Alcohol use No     Allergies   Patient has no known allergies.   Review of Systems Review of Systems 10 Systems reviewed and are negative for acute change except as noted in the HPI.   Physical Exam Updated Vital Signs BP 127/87 (BP Location: Left Arm)   Pulse 72   Temp 98.9 F (37.2 C) (Oral)   Resp 16   Ht 5\' 3"  (1.6 m)   Wt 55.9 kg (123 lb 5 oz)   SpO2 99%   BMI 21.84 kg/m   Physical Exam  Constitutional: He is oriented to  person, place, and time. He appears well-developed and well-nourished. No distress.  HENT:  Head: Normocephalic and atraumatic.  Eyes: EOM are normal.  Pulmonary/Chest: Effort normal.  Abdominal: Soft.  Feeding tube is out ostomy is well granulated. Multiple old surgical scars. Abdomen soft.  Musculoskeletal: Normal range of motion.  Neurological: He is alert and oriented to person, place, and time. Coordination normal.  Skin: Skin is warm and dry.     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 Feeding catheter fell out about 2 hours prior to admission to emergency room. EXAM: ABDOMEN - 1 VIEW COMPARISON:  None. FINDINGS: Supine view the abdomen shows a catheter over the left abdomen with the lumen opacified by contrast material. There is contrast material over the left upper quadrant of the abdomen that appears to be within gastric remnant and opacification of small bowel just below this would be compatible with contrast movement through the gastroenteric anastomosis. No definite evidence for free contrast within the peritoneal cavity. IMPRESSION: Contrast material noted within the stomach and small bowel. Appearance is compatible with contrast movement through the gastrojejunostomy in this patient status post Billroth II procedure. No definite findings to suggest contrast free within the peritoneal cavity. Electronically Signed   By: Misty Stanley M.D.   On: 01/13/2017 20:39    Procedures FEEDING TUBE REPLACEMENT Date/Time: 01/13/2017 9:03 PM Performed by: Charlesetta Shanks Authorized by: Charlesetta Shanks  Consent: Verbal consent obtained. Risks and benefits: risks, benefits and alternatives were discussed Consent  given by: patient Patient identity confirmed: verbally with patient Indications: tube dislodged Local anesthesia used: no  Anesthesia: Local anesthesia used: no  Sedation: Patient sedated: no Tube type: gastrostomy Patient position: supine Procedure type: replacement Endoscope used: no Bulb inflation volume: 10 (ml) Bulb inflation fluid: normal saline Placement/position confirmation: x-ray Tube placement difficulty: none Patient tolerance: Patient tolerated the procedure well with no immediate complications Comments: 58 French Foley placed to maintain ostomy.This is replacing a 22  French gastrostomy tube.    (including critical care time)  Medications Ordered in ED Medications  iopamidol (ISOVUE-300) 61 % injection (not administered)  iopamidol (ISOVUE-300) 61 % injection 30 mL (30 mLs Oral Contrast Given 01/13/17 2021)     Initial Impression / Assessment and Plan / ED Course  I have reviewed the triage vital signs and the nursing notes.  Pertinent labs & imaging results that were available during my care of the patient were reviewed by me and considered in my medical decision making (see chart for details).     Final Clinical Impressions(s) / ED Diagnoses   Final diagnoses:  Encounter for feeding tube placement   Patient's feeding tube had fallen out.similar to was not found in central supply. To maintain patency, a same sized Foley catheter was placed. This irrigated well and is functioning without difficulty. Patient will follow up with his provider for definitive management. New Prescriptions New Prescriptions   No medications on file     Charlesetta Shanks, MD 01/13/17 2105

## 2017-01-13 NOTE — ED Triage Notes (Signed)
Patient reports that his feeding tube fell out approx 2 hours ago. Patient states the insertion site has been having a foul odor and has been having green drainage. Patient states he went to the PCP and has been using Nystatin for a yeast infection.

## 2017-01-14 ENCOUNTER — Other Ambulatory Visit (HOSPITAL_COMMUNITY): Payer: Self-pay | Admitting: Surgery

## 2017-01-14 ENCOUNTER — Encounter (HOSPITAL_COMMUNITY): Payer: Self-pay | Admitting: Interventional Radiology

## 2017-01-14 ENCOUNTER — Ambulatory Visit (HOSPITAL_COMMUNITY)
Admission: RE | Admit: 2017-01-14 | Discharge: 2017-01-14 | Disposition: A | Discharge status: Home / Self Care | Payer: 59 | Source: Ambulatory Visit | Attending: Surgery | Admitting: Surgery

## 2017-01-14 DIAGNOSIS — R633 Feeding difficulties, unspecified: Secondary | ICD-10-CM

## 2017-01-14 DIAGNOSIS — Z434 Encounter for attention to other artificial openings of digestive tract: Secondary | ICD-10-CM | POA: Diagnosis not present

## 2017-01-14 DIAGNOSIS — K9423 Gastrostomy malfunction: Secondary | ICD-10-CM | POA: Diagnosis not present

## 2017-01-14 HISTORY — PX: IR REPLC GASTRO/COLONIC TUBE PERCUT W/FLUORO: IMG2333

## 2017-01-14 MED ORDER — IOPAMIDOL (ISOVUE-300) INJECTION 61%
50.0000 mL | Freq: Once | INTRAVENOUS | Status: AC | PRN
  Administered 2017-01-14: 10 mL

## 2017-01-14 MED ORDER — IOPAMIDOL (ISOVUE-300) INJECTION 61%
INTRAVENOUS | Status: AC
  Filled 2017-01-14: qty 50

## 2017-01-17 ENCOUNTER — Other Ambulatory Visit (HOSPITAL_COMMUNITY): Payer: Self-pay | Admitting: Surgery

## 2017-01-17 ENCOUNTER — Ambulatory Visit (HOSPITAL_COMMUNITY)
Admission: RE | Admit: 2017-01-17 | Discharge: 2017-01-17 | Disposition: A | Discharge status: Home / Self Care | Payer: 59 | Source: Ambulatory Visit | Attending: Surgery | Admitting: Surgery

## 2017-01-17 DIAGNOSIS — Z9884 Bariatric surgery status: Secondary | ICD-10-CM | POA: Insufficient documentation

## 2017-01-17 DIAGNOSIS — K219 Gastro-esophageal reflux disease without esophagitis: Secondary | ICD-10-CM | POA: Diagnosis not present

## 2017-01-31 ENCOUNTER — Ambulatory Visit (INDEPENDENT_AMBULATORY_CARE_PROVIDER_SITE_OTHER): Payer: 59 | Admitting: Orthopaedic Surgery

## 2017-01-31 ENCOUNTER — Encounter (INDEPENDENT_AMBULATORY_CARE_PROVIDER_SITE_OTHER): Payer: Self-pay | Admitting: Orthopaedic Surgery

## 2017-01-31 DIAGNOSIS — N179 Acute kidney failure, unspecified: Secondary | ICD-10-CM | POA: Diagnosis not present

## 2017-01-31 DIAGNOSIS — M65341 Trigger finger, right ring finger: Secondary | ICD-10-CM | POA: Diagnosis not present

## 2017-01-31 DIAGNOSIS — K5720 Diverticulitis of large intestine with perforation and abscess without bleeding: Secondary | ICD-10-CM | POA: Diagnosis not present

## 2017-01-31 MED ORDER — METHYLPREDNISOLONE ACETATE 40 MG/ML IJ SUSP
40.0000 mg | INTRAMUSCULAR | Status: AC | PRN
  Administered 2017-01-31: 40 mg

## 2017-01-31 MED ORDER — LIDOCAINE HCL 1 % IJ SOLN
1.0000 mL | INTRAMUSCULAR | Status: AC | PRN
  Administered 2017-01-31: 1 mL

## 2017-01-31 MED FILL — FAMOTIDINE 20 MG TABLET: 20 | 30 days supply | Qty: 120 | Fill #2

## 2017-01-31 MED FILL — TAMSULOSIN HCL 0.4 MG CAP: 0.4 | 30 days supply | Qty: 30 | Fill #8

## 2017-01-31 NOTE — Progress Notes (Signed)
Office Visit Note   Patient: Miguel [name]           Date of Birth: December 12, 1950           MRN: [id number] Visit Date: 01/31/2017              Requested by: Alroy Dust, L.Marlou Sa, [place] [address] [employer] [address] [place], [place] [address] PCP: Alroy Dust, L.Marlou Sa, MD   Assessment & Plan: Visit Diagnoses:  1. Trigger finger, right ring finger     Plan: Finger injections of the A1 pulley of other fingers in the past.  He has done well with trigger he wished to have this on his right ring finger today and agreed with this.  He understands the risk and benefits of injections and he tolerated this well.  We will work on setting him up with my partner Dr. Sharol Given to evaluate his left great toe hallux valgus  Follow-Up Instructions: Return if symptoms worsen or fail to improve.   Orders:  No orders of the defined types were placed in this encounter.  No orders of the defined types were placed in this encounter.     Procedures: Hand/UE Inj: R ring A1 for trigger finger on 01/31/2017 11:18 AM Medications: 1 mL lidocaine 1 %; 40 mg methylPREDNISolone acetate 40 MG/ML      Clinical Data: No additional findings.   Subjective: Chief Complaint  Patient presents with  . Left Hand - Follow-up  . Right Hand - Follow-up  The patient is well-known to me.  He has had multiple steroid injections and trigger fingers which last we did was his right thumb and left thumb as well as right and left index fingers.  He said those involved and greatly does have some triggering in his right ring finger and like to have injection that today.  He also has a significant bunion of his left great toe and now it is causing his left great toe to encroach on his second toe he like to have this checked out.  HPI  Review of Systems He currently denies any headache, chest pain, shortness of breath, fever, chills, nausea, vomiting.  Objective: Vital Signs: There were no vitals taken for this visit.  Physical Exam He  is alert and oriented x3 and in no acute distress Ortho Exam Examination of both hands show only triggering and pain over the A1 pulley of the right ring finger.  He does have a significant bunion deformity of his right great toe and the angulation of the toe is causing encroachment on the second toe. Specialty Comments:  No specialty comments available.  Imaging: No results found.   PMFS History: Patient Active Problem List   Diagnosis Date Noted  . Trigger finger, right ring finger 01/31/2017  . Trigger finger of left thumb 01/03/2017  . Trigger finger of right thumb 01/03/2017  . Trigger index finger of left hand 01/03/2017  . Trigger index finger of right hand 01/03/2017  . Malnutrition of moderate degree 07/19/2016  . Intra-abdominal abscess ([place]) 07/16/2016  . Cellulitis 07/15/2016  . S/P colostomy takedown 06/28/2016  . Pain in thoracic spine 06/09/2016  . Mid back pain 06/09/2016  . Postoperative fever 11/19/2015  . 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 Severe protein-calorie malnutrition ([place]) 11/17/2015  . Sepsis ([place]) 11/14/2015  . Hiccups 11/14/2015  . AKI (acute kidney injury) ([place]) 11/14/2015  . Fever   . Leg swelling   . Left shoulder pain   . Muscle spasm of  left shoulder   . GI bleed 10/27/2015  . Acute blood loss anemia 10/27/2015  . Syncope 10/27/2015  . Hyperglycemia 10/27/2015  . Hypotension 10/27/2015  . Neck pain 10/27/2015  . Hematemesis 10/27/2015  . Hematochezia 10/27/2015  . Diverticulitis of colon with perforation 07/05/2015   Past Medical History:  Diagnosis Date  . Anemia   . Anxiety   . BPH (benign prostatic hyperplasia)   . Gastritis   . GERD (gastroesophageal reflux disease)    "seldom" (07/16/2016)  . GI bleed due to NSAIDs 10/27/2015  . History of blood transfusion 06/2016   post OR/notes 07/15/2016  . History of hiatal hernia   . History of kidney stones   . Hyperlipidemia   . Hypertension   . 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 Sigmoid diverticulitis    with perforation    Family History  Problem Relation Age of Onset  . Stroke Mother   . Stroke Brother   . Heart disease Brother     Past Surgical History:  Procedure Laterality Date  . COLON SURGERY     sigmoid  . GASTROSTOMY TUBE PLACEMENT  11/21/2015   REDUCTION OF HIATAL HERNIA , REPAIR HIATAL HERNIA, RESECTION SMALL BOWEL WITH ANASTOMOSIS, PLACEMENT GASTROSTOMY TUBE, PLACEMENT DUODENOSTOMY TUBE (N/A)  . HEMORRHOID BANDING  X 2  . HERNIA REPAIR    . INCISIONAL HERNIA REPAIR  06/28/2016   open/notes 07/15/2016  . IR GUIDED DRAIN W CATHETER PLACEMENT  07/06/2016   /NOTES 07/15/2016  . IR RADIOLOGIST EVAL & MGMT  07/27/2016  . IR RADIOLOGIST EVAL & MGMT  08/17/2016  . IR RADIOLOGIST EVAL & MGMT  08/26/2016  . IR [place] TUBE PERCUT W/FLUORO  08/04/2016  . IR [place] TUBE PERCUT W/FLUORO  01/14/2017  . IR US GUIDE BX ASP/DRAIN  08/04/2016  . KNEE CARTILAGE SURGERY Right 1971   "opened me up"  . MELANOMA EXCISION  2001  . TUMOR EXCISION  2009   "back; fatty tumor"   Social History   Occupational History  . Occupation: unable to work since April  Tobacco Use  . Smoking status: Never Smoker  . Smokeless tobacco: Former Systems developer    Types: Snuff  Substance and Sexual Activity  . Alcohol use: No  . Drug use: No  . Sexual activity: Yes

## 2017-02-04 ENCOUNTER — Ambulatory Visit (HOSPITAL_COMMUNITY)
Admission: RE | Admit: 2017-02-04 | Discharge: 2017-02-04 | Disposition: A | Discharge status: Home / Self Care | Payer: 59 | Source: Ambulatory Visit | Attending: General Surgery | Admitting: General Surgery

## 2017-02-04 ENCOUNTER — Other Ambulatory Visit (HOSPITAL_COMMUNITY): Payer: Self-pay | Admitting: General Surgery

## 2017-02-04 ENCOUNTER — Encounter (HOSPITAL_COMMUNITY): Payer: Self-pay | Admitting: Interventional Radiology

## 2017-02-04 DIAGNOSIS — K9423 Gastrostomy malfunction: Secondary | ICD-10-CM | POA: Insufficient documentation

## 2017-02-04 DIAGNOSIS — R633 Feeding difficulties, unspecified: Secondary | ICD-10-CM

## 2017-02-04 DIAGNOSIS — R1310 Dysphagia, unspecified: Secondary | ICD-10-CM | POA: Diagnosis not present

## 2017-02-04 DIAGNOSIS — Y733 Surgical instruments, materials and gastroenterology and urology devices (including sutures) associated with adverse incidents: Secondary | ICD-10-CM | POA: Diagnosis not present

## 2017-02-04 HISTORY — PX: IR CM INJ ANY COLONIC TUBE W/FLUORO: IMG2336

## 2017-02-04 MED ORDER — IOPAMIDOL (ISOVUE-300) INJECTION 61%
INTRAVENOUS | Status: AC
  Administered 2017-02-04: 10 mL
  Filled 2017-02-04: qty 50

## 2017-02-04 MED ORDER — IOPAMIDOL (ISOVUE-300) INJECTION 61%
50.0000 mL | Freq: Once | INTRAVENOUS | Status: AC | PRN
  Administered 2017-02-04: 10 mL

## 2017-02-04 NOTE — Procedures (Signed)
Pre procedural Dx: Dysphagia, poorly functioning feeding tube. Post procedural Dx: Same  Successful fluoroscopic guided replacement of exisitng 22 Fr gastrostomy tube.   The feeding tube is ready for immediate use.  EBL: None  Complications: None immediate.  Jay Ayelet Gruenewald, MD Pager #: [phone]  

## 2017-02-07 ENCOUNTER — Ambulatory Visit (INDEPENDENT_AMBULATORY_CARE_PROVIDER_SITE_OTHER): Payer: 59 | Admitting: Orthopedic Surgery

## 2017-02-07 ENCOUNTER — Encounter (INDEPENDENT_AMBULATORY_CARE_PROVIDER_SITE_OTHER): Payer: Self-pay | Admitting: Orthopedic Surgery

## 2017-02-07 ENCOUNTER — Ambulatory Visit (INDEPENDENT_AMBULATORY_CARE_PROVIDER_SITE_OTHER): Payer: 59

## 2017-02-07 DIAGNOSIS — M205X2 Other deformities of toe(s) (acquired), left foot: Secondary | ICD-10-CM | POA: Diagnosis not present

## 2017-02-07 DIAGNOSIS — M2022 Hallux rigidus, left foot: Secondary | ICD-10-CM

## 2017-02-07 NOTE — Progress Notes (Signed)
Office Visit Note   Patient: Miguel [name]           Date of Birth: [birth year]           MRN: [id number] Visit Date: 02/07/2017              Requested by: Alroy Dust, L.Marlou Sa, [place] [address] [employer] [address] [place], [place] [address] PCP: Alroy Dust, L.Marlou Sa, MD  Chief Complaint  Patient presents with  . Left Foot - Follow-up      HPI: Is a 66 year old gentleman who was seen for initial evaluation for left foot bunion deformity and claw toe deformity of the second toe with the great toe under the second toe with ulceration over the PIP joint of the second toe due to shoe wear there is also redness medially over the bunion prominence.  Patient has recently been hospitalized for about 100 days due to general surgery procedures.  He states that he no longer has a colostomy but still has a feeding tube.  Assessment & Plan: Visit Diagnoses:  1. Hallux rigidus, left foot   2. Acquired claw toe, left     Plan: Discussed the patient continue increase his energy and would proceed with surgery once he is safe from a medical standpoint.  Patient was given a silicone spacer placed in the first and second webspace.  Discussed that once he is safe from a surgical standpoint we could proceed with a chevron osteotomy possible Akin osteotomy and a Weil osteotomy of the second toe.  Risks and benefits of surgery were discussed including infection need for nonweightbearing for 2 weeks need for additional surgery.  Patient states he understands he will continue to rehab his most recent surgeries will improve his nutritional stores and will follow-up once he is safe for bunion and claw toe surgery.  Follow-Up Instructions: Return if symptoms worsen or fail to improve.   Ortho Exam  Patient is alert, oriented, no adenopathy, well-dressed, normal affect, normal respiratory effort. Examination patient has a good dorsalis pedis pulse.  He has redness over the medial eminence of his bunion.  He has no pain  with range of motion of the MTP joint no signs or symptoms of hallux rigidus.  The MTP joint is subluxed with valgus deformity of the great toe beneath the second toe.  Patient has pre-ulcerative redness over the PIP joint of the second toe.  There is also an interdigital 3 mm ulcer between the second and third toe.  There is no a sending cellulitis no redness no signs of infection.  No clinical signs of gout.  Imaging: Xr Foot Complete Left  Result Date: 02/07/2017 Three-view radiographs of the left foot shows hallux valgus deformity with prominent medial first metatarsal head bony prominence.  Patient has a non congruent MTP joint he has a long second metatarsal.  No periarticular bony cysts.  No images are attached to the encounter.  Labs: Lab Results  Component Value Date   HGBA1C 5.5 06/21/2016   HGBA1C 5.9 (H) 10/27/2015   REPTSTATUS 08/09/2016 FINAL 08/04/2016   GRAMSTAIN  08/04/2016    FEW WBC PRESENT,BOTH PMN AND MONONUCLEAR NO ORGANISMS SEEN    CULT NO GROWTH 5 DAYS 08/04/2016   LABORGA ESCHERICHIA COLI 08/04/2016   LABORGA ESCHERICHIA COLI 08/04/2016    Orders:  Orders Placed This Encounter  Procedures  . XR Foot Complete Left   No orders of the defined types were placed in this encounter.    Procedures: No procedures performed  Clinical Data: No additional findings.  ROS:  All other systems negative, except as noted in the HPI. Review of Systems  Objective: Vital Signs: There were no vitals taken for this visit.  Specialty Comments:  No specialty comments available.  PMFS History: Patient Active Problem List   Diagnosis Date Noted  . Trigger finger, right ring finger 01/31/2017  . Trigger finger of left thumb 01/03/2017  . Trigger finger of right thumb 01/03/2017  . Trigger index finger of left hand 01/03/2017  . Trigger index finger of right hand 01/03/2017  . Malnutrition of moderate degree 07/19/2016  . Intra-abdominal abscess ([place]) 07/16/2016   . Cellulitis 07/15/2016  . S/P colostomy takedown 06/28/2016  . Pain in thoracic spine 06/09/2016  . Mid back pain 06/09/2016  . Postoperative fever 11/19/2015  . 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 Severe protein-calorie malnutrition ([place]) 11/17/2015  . Sepsis ([place]) 11/14/2015  . Hiccups 11/14/2015  . AKI (acute kidney injury) ([place]) 11/14/2015  . Fever   . Leg swelling   . Left shoulder pain   . Muscle spasm of left shoulder   . GI bleed 10/27/2015  . Acute blood loss anemia 10/27/2015  . Syncope 10/27/2015  . Hyperglycemia 10/27/2015  . Hypotension 10/27/2015  . Neck pain 10/27/2015  . Hematemesis 10/27/2015  . Hematochezia 10/27/2015  . Diverticulitis of colon with perforation 07/05/2015   Past Medical History:  Diagnosis Date  . Anemia   . Anxiety   . BPH (benign prostatic hyperplasia)   . Gastritis   . GERD (gastroesophageal reflux disease)    "seldom" (07/16/2016)  . GI bleed due to NSAIDs 10/27/2015  . History of blood transfusion 06/2016   post OR/notes 07/15/2016  . History of hiatal hernia   . History of kidney stones   . Hyperlipidemia   . Hypertension   . Melanoma of back ([place])    "mid back"  . Sigmoid diverticulitis    with perforation    Family History  Problem Relation Age of Onset  . Stroke Mother   . Stroke Brother   . Heart disease Brother     Past Surgical History:  Procedure Laterality Date  . COLON SURGERY     sigmoid  . GASTROSTOMY TUBE PLACEMENT  11/21/2015   REDUCTION OF HIATAL HERNIA , REPAIR HIATAL HERNIA, RESECTION SMALL BOWEL WITH ANASTOMOSIS, PLACEMENT GASTROSTOMY TUBE, PLACEMENT DUODENOSTOMY TUBE (N/A)  . HEMORRHOID BANDING  X 2  . HERNIA REPAIR    . INCISIONAL HERNIA REPAIR  06/28/2016   open/notes 07/15/2016  . IR [place] INJ ANY COLONIC TUBE W/FLUORO  02/04/2017  . IR GUIDED DRAIN W CATHETER PLACEMENT  07/06/2016   /NOTES 07/15/2016  . IR RADIOLOGIST EVAL & MGMT  07/27/2016  . IR RADIOLOGIST EVAL & MGMT  08/17/2016  . IR  RADIOLOGIST EVAL & MGMT  08/26/2016  . IR [place] TUBE PERCUT W/FLUORO  08/04/2016  . IR [place] TUBE PERCUT W/FLUORO  01/14/2017  . IR US GUIDE BX ASP/DRAIN  08/04/2016  . KNEE CARTILAGE SURGERY Right 1971   "opened me up"  . MELANOMA EXCISION  2001  . TUMOR EXCISION  2009   "back; fatty tumor"   Social History   Occupational History  . Occupation: unable to work since April  Tobacco Use  . Smoking status: Never Smoker  . Smokeless tobacco: Former Systems developer    Types: Snuff  Substance and Sexual Activity  . Alcohol use: No  . Drug use: No  . Sexual activity:  Yes

## 2017-02-28 MED FILL — TAMSULOSIN HCL 0.4 MG CAP: 0.4 | 30 days supply | Qty: 30 | Fill #9

## 2017-03-01 DIAGNOSIS — K5720 Diverticulitis of large intestine with perforation and abscess without bleeding: Secondary | ICD-10-CM | POA: Diagnosis not present

## 2017-03-01 DIAGNOSIS — N179 Acute kidney failure, unspecified: Secondary | ICD-10-CM | POA: Diagnosis not present

## 2017-03-14 DIAGNOSIS — G4700 Insomnia, unspecified: Secondary | ICD-10-CM | POA: Diagnosis not present

## 2017-03-14 DIAGNOSIS — R630 Anorexia: Secondary | ICD-10-CM | POA: Diagnosis not present

## 2017-03-14 DIAGNOSIS — F419 Anxiety disorder, unspecified: Secondary | ICD-10-CM | POA: Diagnosis not present

## 2017-03-14 DIAGNOSIS — R350 Frequency of micturition: Secondary | ICD-10-CM | POA: Diagnosis not present

## 2017-03-14 DIAGNOSIS — R972 Elevated prostate specific antigen [PSA]: Secondary | ICD-10-CM | POA: Diagnosis not present

## 2017-03-14 DIAGNOSIS — D509 Iron deficiency anemia, unspecified: Secondary | ICD-10-CM | POA: Diagnosis not present

## 2017-03-14 DIAGNOSIS — E7800 Pure hypercholesterolemia, unspecified: Secondary | ICD-10-CM | POA: Diagnosis not present

## 2017-03-14 DIAGNOSIS — M545 Low back pain: Secondary | ICD-10-CM | POA: Diagnosis not present

## 2017-03-18 DIAGNOSIS — L929 Granulomatous disorder of the skin and subcutaneous tissue, unspecified: Secondary | ICD-10-CM | POA: Diagnosis not present

## 2017-03-28 MED FILL — TAMSULOSIN HCL 0.4 MG CAP: 0.4 | 90 days supply | Qty: 90 | Fill #0 | Status: TO

## 2017-04-06 MED FILL — FAMOTIDINE 20 MG TABLET: 20 | 30 days supply | Qty: 120 | Fill #0 | Status: TO

## 2017-04-11 ENCOUNTER — Telehealth: Payer: Self-pay | Admitting: Physician Assistant

## 2017-04-11 NOTE — Telephone Encounter (Signed)
  Patient called with concerns of "tasting petroleum jelly in his mouth"  He tells me he had been putting Desitin on his abdomen and around the tube itself, then he felt it was getting too dry, so he switched to petroleum jelly.  He does have GERD and feels like the jelly got into his stomach and he is refluxing it.  He denied any skin irritation now so I have instructed him that he does not need any cream/ointment at all now as long as there is no irritation. He seemed surprised to learn that he didn't need to put anything on the skin and the tube.  He understands to stop using creams/ointments for now.  Judie Grieve BLAIR PA-C 04/11/2017 3:54 PM

## 2017-04-15 DIAGNOSIS — L929 Granulomatous disorder of the skin and subcutaneous tissue, unspecified: Secondary | ICD-10-CM | POA: Diagnosis not present

## 2017-04-29 ENCOUNTER — Other Ambulatory Visit (HOSPITAL_COMMUNITY): Payer: Self-pay | Admitting: Radiology

## 2017-04-29 DIAGNOSIS — R633 Feeding difficulties, unspecified: Secondary | ICD-10-CM

## 2017-05-04 DIAGNOSIS — L929 Granulomatous disorder of the skin and subcutaneous tissue, unspecified: Secondary | ICD-10-CM | POA: Diagnosis not present

## 2017-05-05 ENCOUNTER — Ambulatory Visit (HOSPITAL_COMMUNITY)
Admission: RE | Admit: 2017-05-05 | Discharge: 2017-05-05 | Disposition: A | Discharge status: Home / Self Care | Payer: Medicare Other | Source: Ambulatory Visit | Attending: Radiology | Admitting: Radiology

## 2017-05-05 ENCOUNTER — Other Ambulatory Visit (HOSPITAL_COMMUNITY): Payer: Self-pay | Admitting: Radiology

## 2017-05-05 DIAGNOSIS — R633 Feeding difficulties, unspecified: Secondary | ICD-10-CM

## 2017-05-05 MED ORDER — IOPAMIDOL (ISOVUE-300) INJECTION 61%: 50.0000 mL | Freq: Once | INTRAVENOUS | Status: DC | PRN

## 2017-05-31 DIAGNOSIS — L929 Granulomatous disorder of the skin and subcutaneous tissue, unspecified: Secondary | ICD-10-CM | POA: Diagnosis not present

## 2017-06-09 IMAGING — CT CT ABD-PELV W/ CM
2 of 5 series · 16 of 46 positions shown, 18 images · IV contrast (iopamidol)
Comparison: CT scan of July 04, 2015. Barium examination [DATE]

CLINICAL DATA: Generalized abdominal pain.

EXAM:
CT ABDOMEN AND PELVIS WITH CONTRAST
TECHNIQUE: Multidetector CT imaging of the abdomen and pelvis was performed
using the standard protocol following bolus administration of
intravenous contrast.
CONTRAST:  100mL [id number] IOPAMIDOL ([id number]) INJECTION 61%

[Series 4: a/p w/ 5mm · axial · 0.74mm/px · z∈[-672,-247]mm · 13 of 97 slices shown, 15 images]
[im 6/97  soft-tissue]
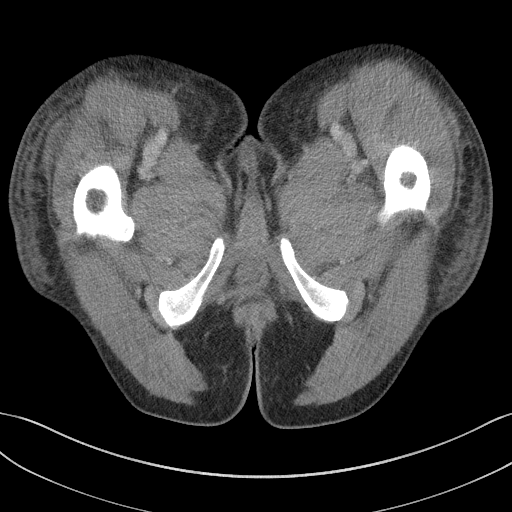
[im 6/97  bone]
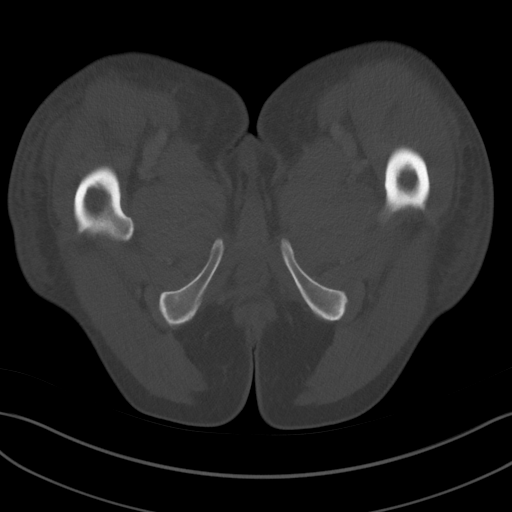
[im 11/97  soft-tissue]
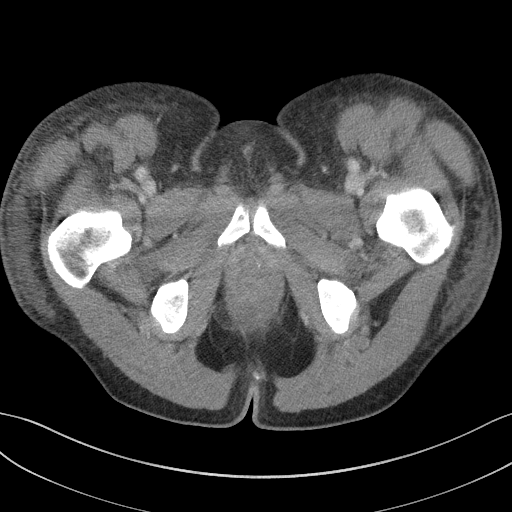
[im 22/97  soft-tissue]
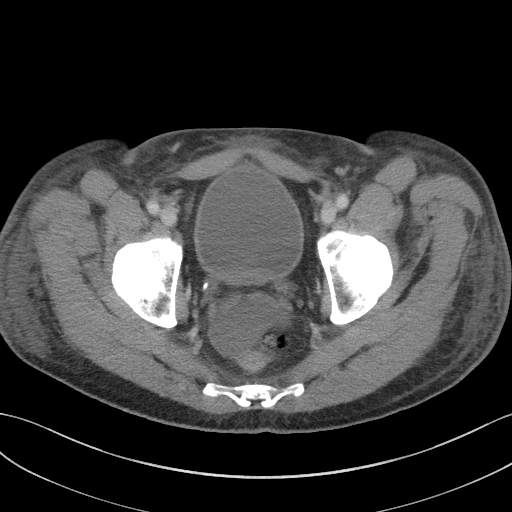
[im 27/97  soft-tissue]
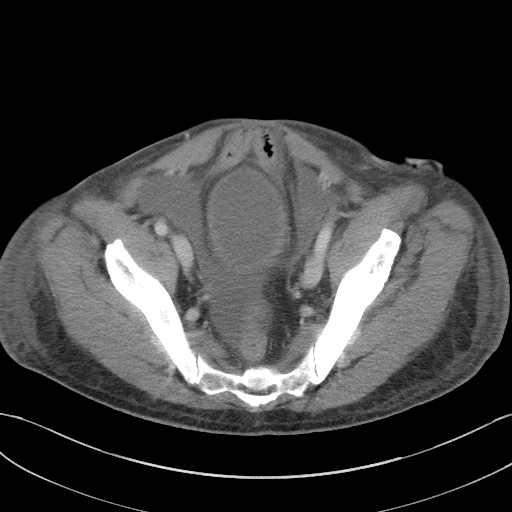
[im 33/97  soft-tissue]
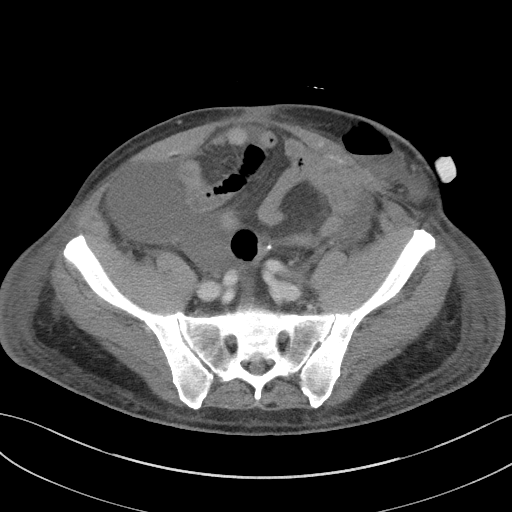
[im 43/97  soft-tissue]
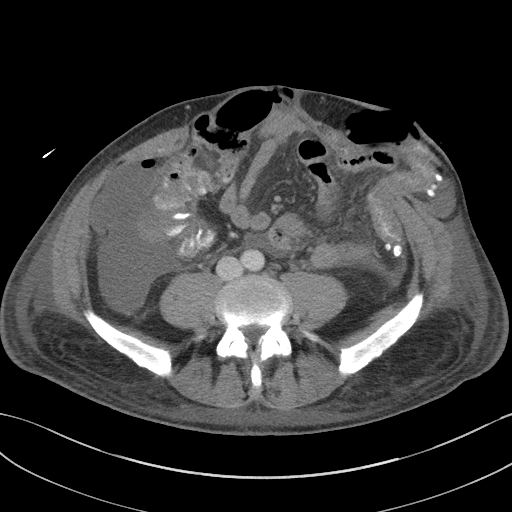
[im 49/97  soft-tissue]
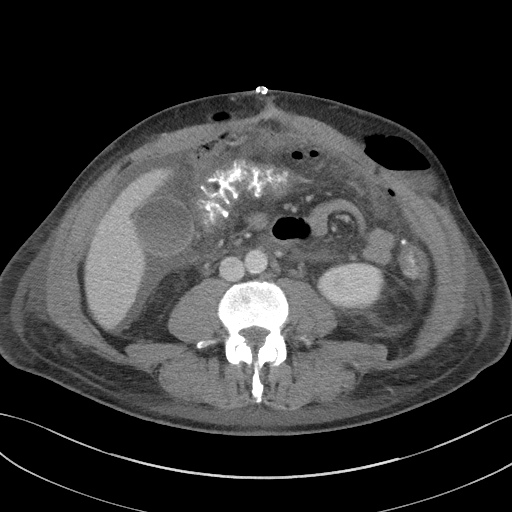
[im 54/97  soft-tissue]
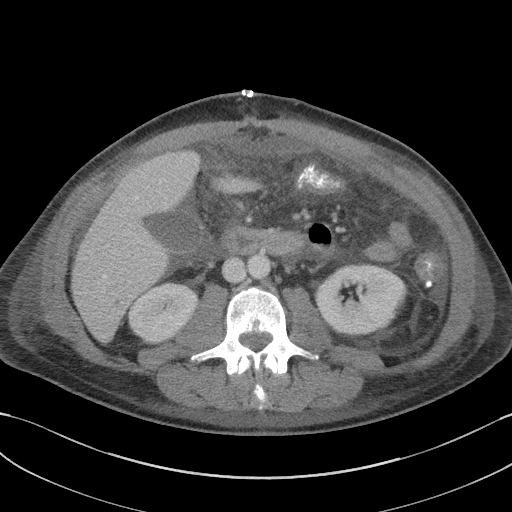
[im 65/97  soft-tissue]
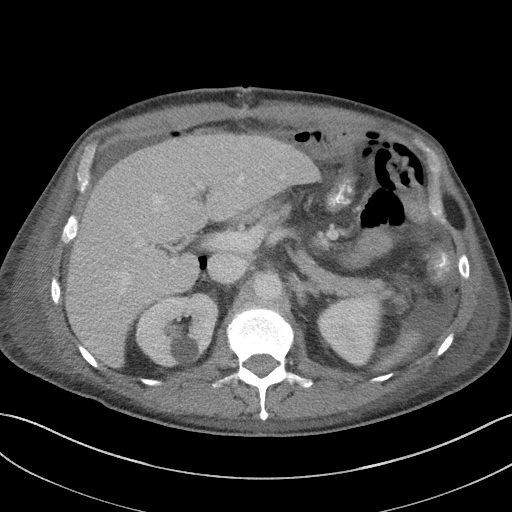
[im 65/97  bone]
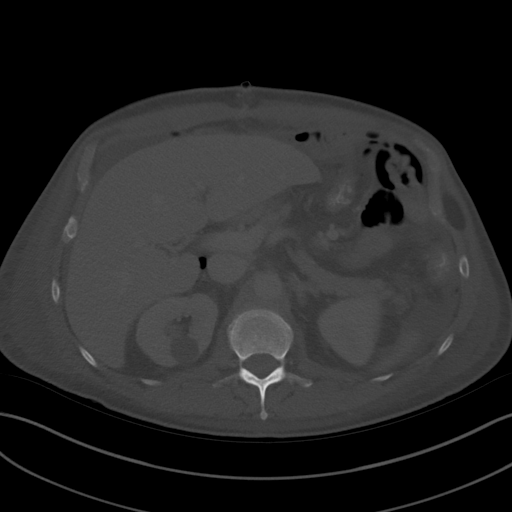
[im 70/97  soft-tissue]
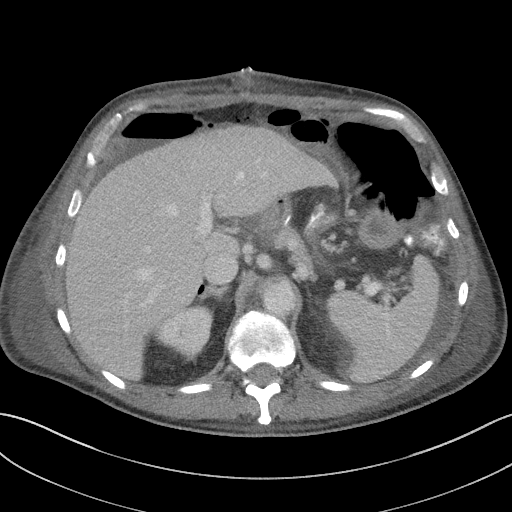
[im 75/97  soft-tissue]
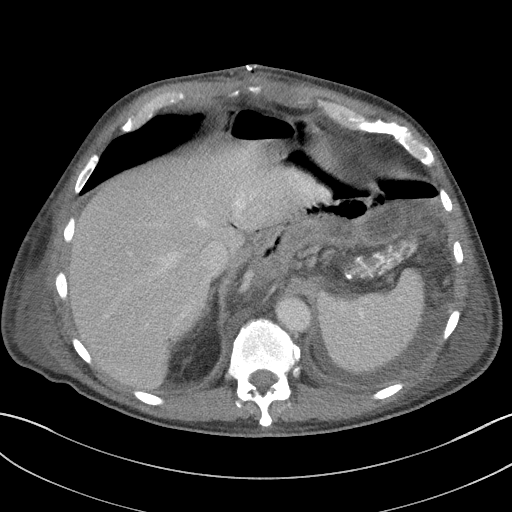
[im 86/97  soft-tissue]
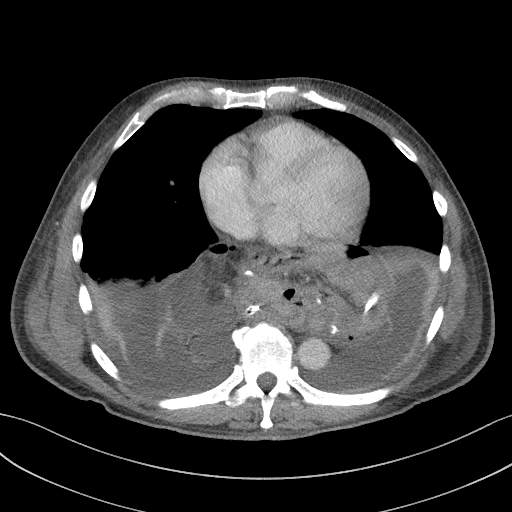
[im 91/97  soft-tissue]
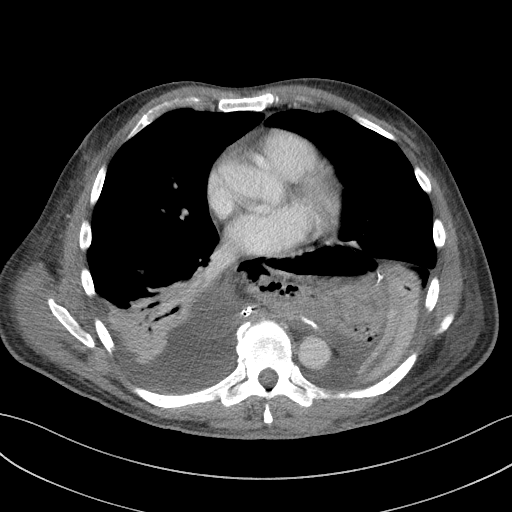

[Series 7: a/p w/ cor · coronal · 0.75mm/px · 3 of 138 slices shown]
[im 46/138  soft-tissue]
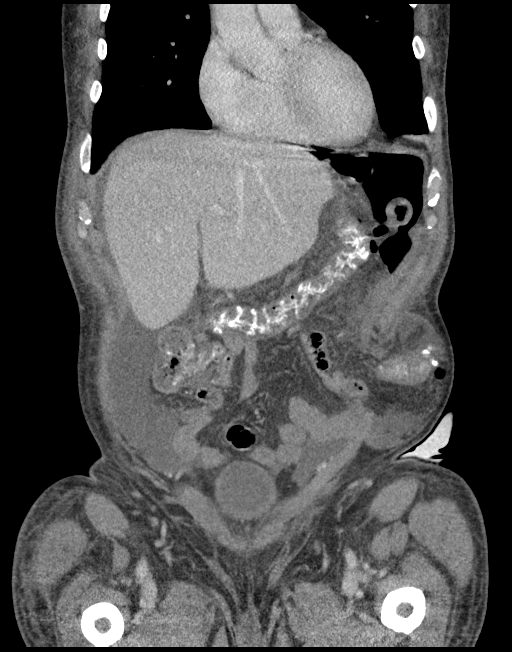
[im 61/138  soft-tissue]
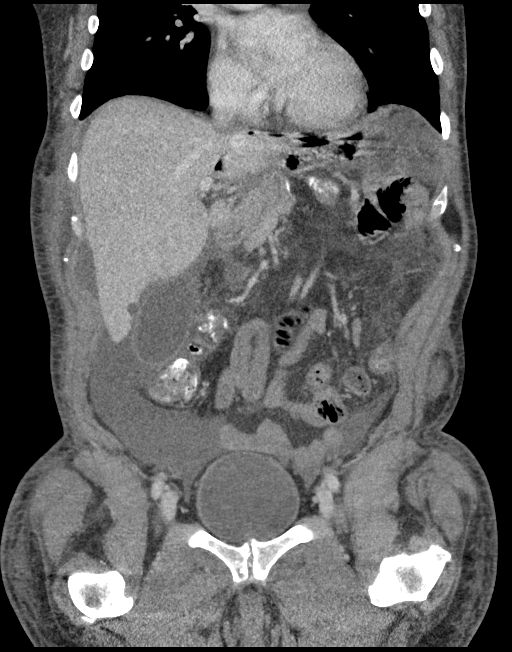
[im 77/138  soft-tissue]
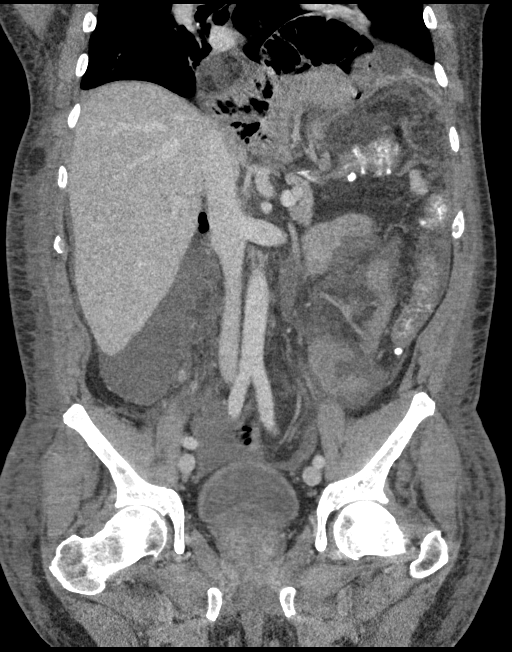

[16 of 46 positions shown; findings below may reference images not displayed]

FINDINGS: Mild degenerative disc disease is noted at L1-2 and L5-S1. Moderate
bilateral pleural effusions are noted with adjacent subsegmental
atelectasis.

No gallstones are noted. The liver, spleen and pancreas
unremarkable. Adrenal glands appear normal. Stable right renal cyst
is noted. Bilateral nephrolithiasis is noted. No hydronephrosis or
renal obstruction is noted. Pneumoperitoneum is noted consistent
with history of recent gastric surgery. Large hiatal hernia is noted
as well, containing almost the entire stomach and potentially a
small bowel loop. Moderate amount of free fluid is noted in the
pelvis and right pericolic gutter. Colostomy is noted in left lower
quadrant. Peristomal hernia is noted which contains small bowel
loops, but does not result in incarceration or obstruction. Mild
ventral hernia is also noted in the midline. Urinary bladder appears
normal. No significant adenopathy is noted.
IMPRESSION: Moderate bilateral pleural effusions are noted with adjacent
subsegmental atelectasis.

Bilateral nonobstructive nephrolithiasis.

Large hiatal hernia is again noted, containing almost the entire
stomach and potentially a small bowel loop.

Left lower quadrant colostomy is noted, with peristomal hernia which
contains small bowel loops, but does not result in incarceration or
obstruction.

Pneumoperitoneum is noted as well as a moderate amount of fluid in
the pelvis and right pericolic gutter which most likely is related
to recent gastric surgery. These results were called by telephone at
the time of interpretation on 11/04/2015 at [DATE] to Dr. Keturah, who
verbally acknowledged these results.

## 2017-06-10 IMAGING — CR DG CHEST 2V
2 series · 2 of 2 positions shown · non-contrast
Comparison: Radiograph October 30, 2015.

CLINICAL DATA: Fever.

EXAM:
CHEST  2 VIEW

[chest pa]
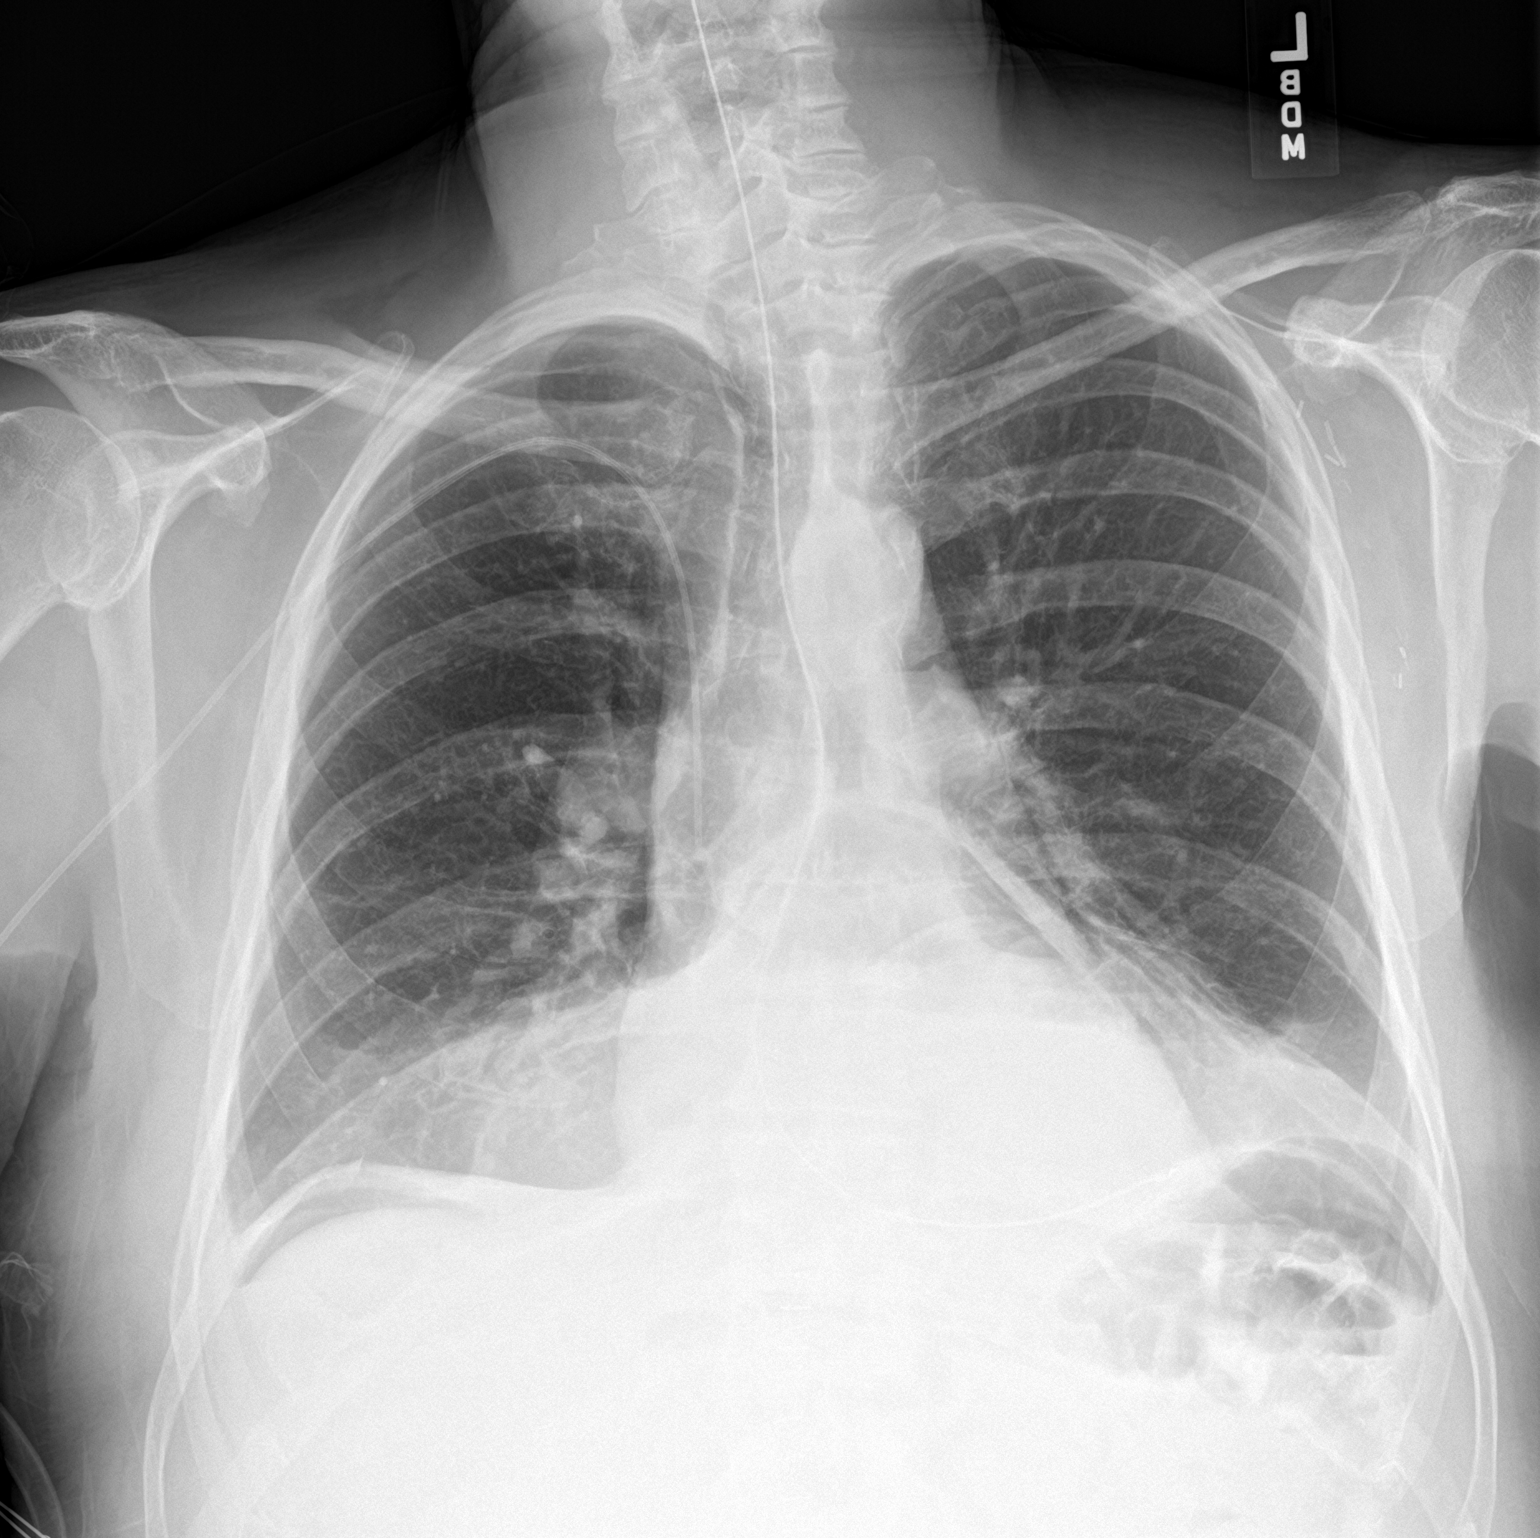

[chest lat]
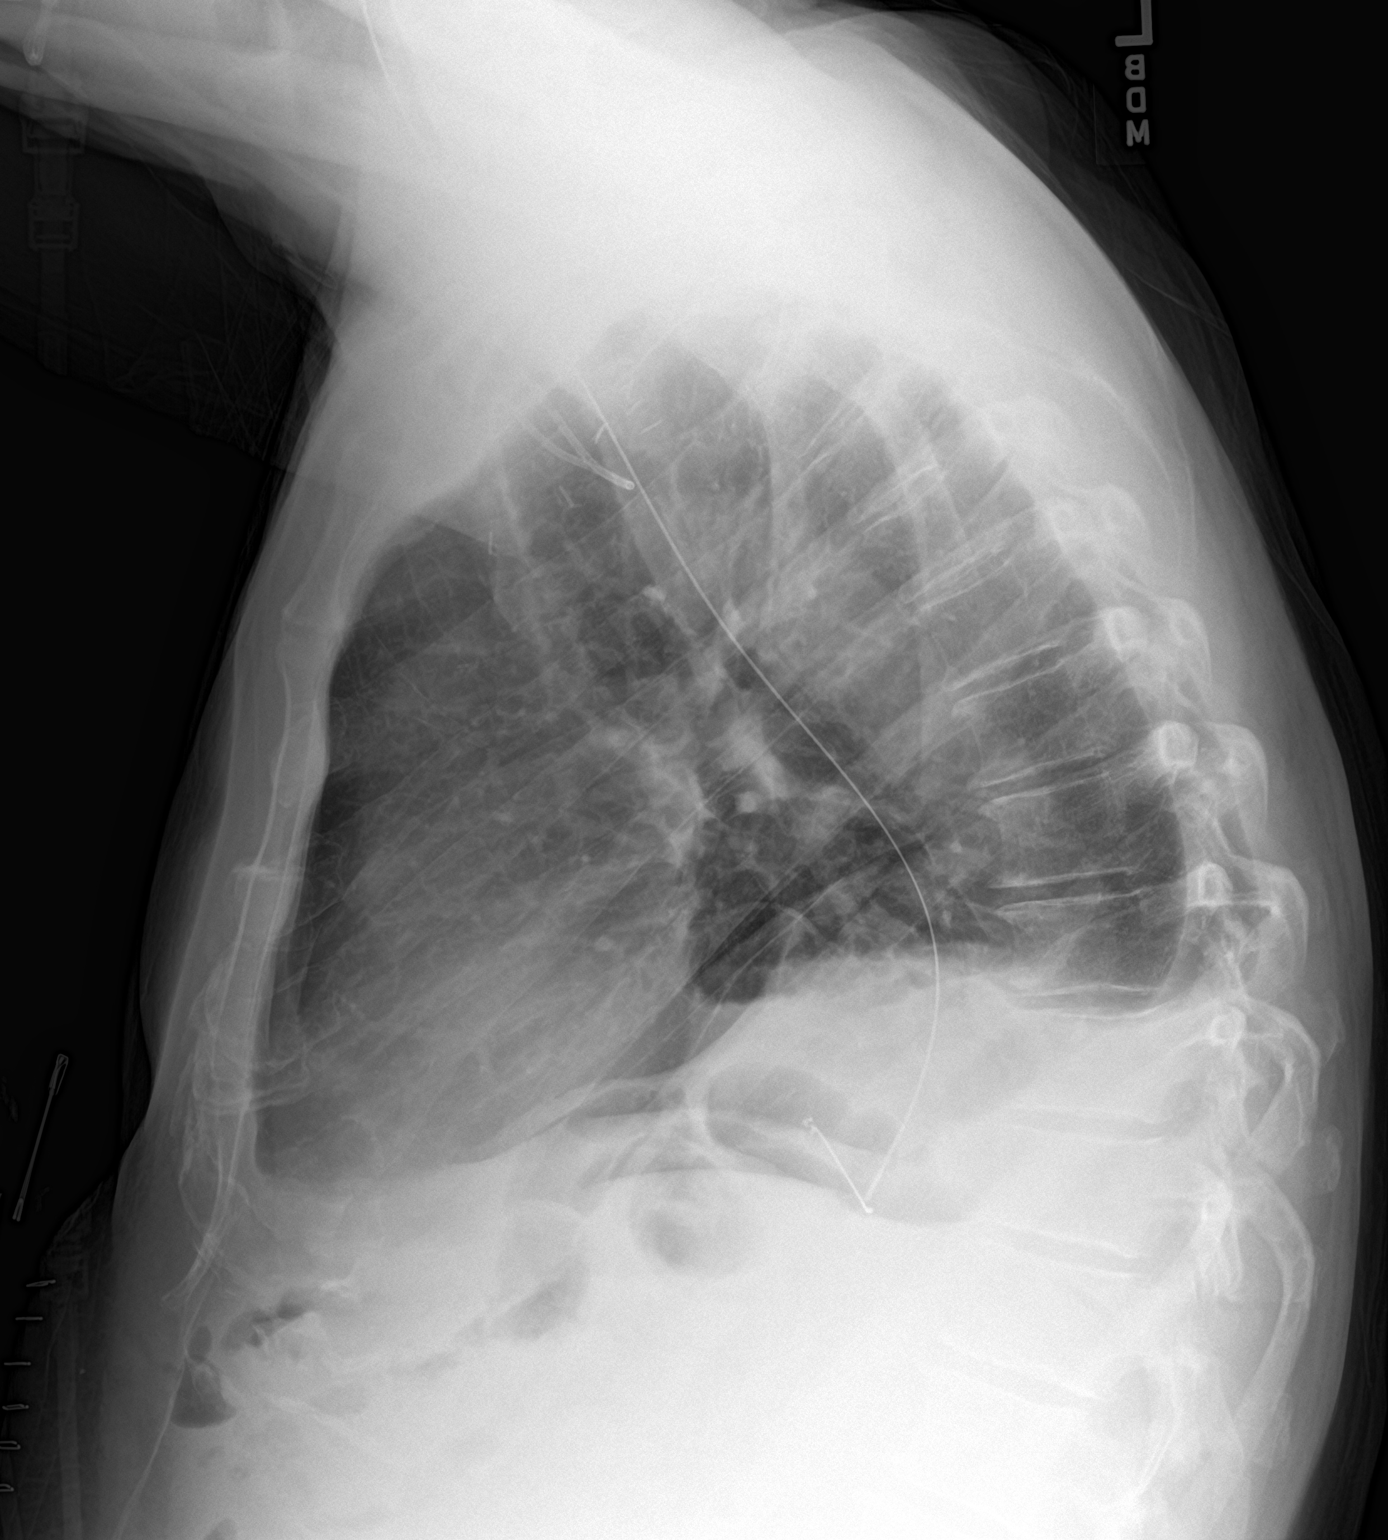

[2 of 2 positions shown; findings below may reference images not displayed]

FINDINGS: The heart size and mediastinal contours are within normal limits.
Large hiatal hernia is again noted. Nasogastric tube tip remains
within hiatal hernia. Mildly increased bilateral lung opacities are
noted concerning for atelectasis and associated pleural effusions.
No pneumothorax is noted. Interval placement of right-sided PICC
line with distal tip in expected position of the SVC.
Pneumoperitoneum is noted under right hemidiaphragm consistent with
recent surgery. The visualized skeletal structures are unremarkable.
IMPRESSION: Mildly increased bibasilar atelectasis with associated pleural
effusions is noted. Interval placement of right-sided PICC line with
distal tip in expected position of the SVC. Stable large hiatal
hernia.

## 2017-06-20 DIAGNOSIS — R112 Nausea with vomiting, unspecified: Secondary | ICD-10-CM | POA: Diagnosis not present

## 2017-06-20 DIAGNOSIS — L929 Granulomatous disorder of the skin and subcutaneous tissue, unspecified: Secondary | ICD-10-CM | POA: Diagnosis not present

## 2017-06-21 ENCOUNTER — Other Ambulatory Visit: Payer: Self-pay | Admitting: Surgery

## 2017-06-21 DIAGNOSIS — R112 Nausea with vomiting, unspecified: Secondary | ICD-10-CM

## 2017-06-27 DIAGNOSIS — R633 Feeding difficulties: Secondary | ICD-10-CM | POA: Diagnosis not present

## 2017-06-27 DIAGNOSIS — R110 Nausea: Secondary | ICD-10-CM | POA: Diagnosis not present

## 2017-06-27 DIAGNOSIS — K219 Gastro-esophageal reflux disease without esophagitis: Secondary | ICD-10-CM | POA: Diagnosis not present

## 2017-06-30 ENCOUNTER — Ambulatory Visit
Admission: RE | Admit: 2017-06-30 | Discharge: 2017-06-30 | Disposition: A | Discharge status: Home / Self Care | Payer: Medicare Other | Source: Ambulatory Visit | Attending: Surgery | Admitting: Surgery

## 2017-06-30 DIAGNOSIS — N401 Enlarged prostate with lower urinary tract symptoms: Secondary | ICD-10-CM | POA: Diagnosis not present

## 2017-06-30 DIAGNOSIS — R112 Nausea with vomiting, unspecified: Secondary | ICD-10-CM

## 2017-06-30 MED ORDER — IOPAMIDOL (ISOVUE-300) INJECTION 61%
100.0000 mL | Freq: Once | INTRAVENOUS | Status: AC | PRN
  Administered 2017-06-30: 100 mL via INTRAVENOUS

## 2017-07-01 DIAGNOSIS — K209 Esophagitis, unspecified: Secondary | ICD-10-CM | POA: Diagnosis not present

## 2017-07-01 DIAGNOSIS — K219 Gastro-esophageal reflux disease without esophagitis: Secondary | ICD-10-CM | POA: Diagnosis not present

## 2017-07-01 DIAGNOSIS — Z980 Intestinal bypass and anastomosis status: Secondary | ICD-10-CM | POA: Diagnosis not present

## 2017-07-01 DIAGNOSIS — K210 Gastro-esophageal reflux disease with esophagitis: Secondary | ICD-10-CM | POA: Diagnosis not present

## 2017-07-01 DIAGNOSIS — K2210 Ulcer of esophagus without bleeding: Secondary | ICD-10-CM | POA: Diagnosis not present

## 2017-07-07 DIAGNOSIS — K219 Gastro-esophageal reflux disease without esophagitis: Secondary | ICD-10-CM | POA: Diagnosis not present

## 2017-07-07 DIAGNOSIS — K209 Esophagitis, unspecified: Secondary | ICD-10-CM | POA: Diagnosis not present

## 2017-07-13 DIAGNOSIS — L929 Granulomatous disorder of the skin and subcutaneous tissue, unspecified: Secondary | ICD-10-CM | POA: Diagnosis not present

## 2017-07-18 ENCOUNTER — Ambulatory Visit (INDEPENDENT_AMBULATORY_CARE_PROVIDER_SITE_OTHER): Payer: Medicare Other | Admitting: Orthopaedic Surgery

## 2017-07-18 ENCOUNTER — Encounter (INDEPENDENT_AMBULATORY_CARE_PROVIDER_SITE_OTHER): Payer: Self-pay | Admitting: Orthopaedic Surgery

## 2017-07-18 DIAGNOSIS — M65312 Trigger thumb, left thumb: Secondary | ICD-10-CM | POA: Diagnosis not present

## 2017-07-18 DIAGNOSIS — M65322 Trigger finger, left index finger: Secondary | ICD-10-CM

## 2017-07-18 DIAGNOSIS — M65341 Trigger finger, right ring finger: Secondary | ICD-10-CM | POA: Diagnosis not present

## 2017-07-18 MED ORDER — METHYLPREDNISOLONE ACETATE 40 MG/ML IJ SUSP
40.0000 mg | INTRAMUSCULAR | Status: AC | PRN
  Administered 2017-07-18: 40 mg

## 2017-07-18 MED ORDER — LIDOCAINE HCL 1 % IJ SOLN
1.0000 mL | INTRAMUSCULAR | Status: AC | PRN
  Administered 2017-07-18: 1 mL

## 2017-07-18 NOTE — Progress Notes (Signed)
Office Visit Note   Patient: Miguel [name]           Date of Birth: March 31, 1950           MRN: [id number] Visit Date: 07/18/2017              Requested by: Alroy Dust, L.Marlou Sa, [place] [address] [place], [place] [address] PCP: Alroy Dust, L.Marlou Sa, MD   Assessment & Plan: Visit Diagnoses:  1. Trigger finger of left thumb   2. Trigger finger, left index finger   3. Trigger finger, right ring finger     Plan: I was able to inject with a steroid his left thumb and his left index finger as well as his right ring finger today.  He tolerated these well.  We will see him back next month for injections and likely his right thumb and right index finger.  All questions concerns were answered and addressed.  Follow-Up Instructions: Return in about 1 month (around 08/15/2017).   Orders:  Orders Placed This Encounter  Procedures  . Hand/UE Inj  . Hand/UE Inj  . Hand/UE Inj   No orders of the defined types were placed in this encounter.     Procedures: Hand/UE Inj: L thumb A1 for trigger finger on 07/18/2017 3:40 PM  Hand/UE Inj: R ring A1 for trigger finger on 07/18/2017 3:40 PM  Hand/UE Inj: L index A1 for trigger finger on 07/18/2017 3:40 PM Medications: 1 mL lidocaine 1 %; 40 mg methylPREDNISolone acetate 40 MG/ML      Clinical Data: No additional findings.   Subjective: Chief Complaint  Patient presents with  . Right Hand - Pain    Triggering thumb, index and ring fingers  . Left Hand - Pain    Triggering thumb and index fingers  Patient comes in today with multiple trigger finger ring of his left thumb and is left index finger as well as his right ring finger his right index finger and his right thumb.  He understands he can just have 3 injections today.  I would not put a lot of steroid in his body.  The right ring finger as well as the left thumb and left index finger bothers him the most.  We have injected some of these before about 6 months ago and it helped  for a while.  He is otherwise stable in terms of his other multiple medical issues.  HPI  Review of Systems He denies any fever and chills  Objective: Vital Signs: There were no vitals taken for this visit.  Physical Exam He is alert and oriented x3 and in no acute distress Ortho Exam Examination of his left hand shows pain over the A1 pulley of the left thumb and left index finger with active triggering.  Damage of his right hand shows pain over the thumb as well as index and ring fingers with pain and active triggering. Specialty Comments:  No specialty comments available.  Imaging: No results found.   PMFS History: Patient Active Problem List   Diagnosis Date Noted  . Trigger finger, left index finger 07/18/2017  . Trigger finger, right ring finger 01/31/2017  . Trigger finger of left thumb 01/03/2017  . Trigger finger of right thumb 01/03/2017  . Trigger index finger of left hand 01/03/2017  . Trigger index finger of right hand 01/03/2017  . Malnutrition of moderate degree 07/19/2016  . Intra-abdominal abscess ([place]) 07/16/2016  . Cellulitis 07/15/2016  . S/P colostomy takedown 06/28/2016  .  Pain in thoracic spine 06/09/2016  . Mid back pain 06/09/2016  . Postoperative fever 11/19/2015  . 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 Severe protein-calorie malnutrition ([place]) 11/17/2015  . Sepsis ([place]) 11/14/2015  . Hiccups 11/14/2015  . AKI (acute kidney injury) ([place]) 11/14/2015  . Fever   . Leg swelling   . Left shoulder pain   . Muscle spasm of left shoulder   . GI bleed 10/27/2015  . Acute blood loss anemia 10/27/2015  . Syncope 10/27/2015  . Hyperglycemia 10/27/2015  . Hypotension 10/27/2015  . Neck pain 10/27/2015  . Hematemesis 10/27/2015  . Hematochezia 10/27/2015  . Diverticulitis of colon with perforation 07/05/2015   Past Medical History:  Diagnosis Date  . Anemia   . Anxiety   . BPH (benign prostatic hyperplasia)   . Gastritis   . GERD  (gastroesophageal reflux disease)    "seldom" (07/16/2016)  . GI bleed due to NSAIDs 10/27/2015  . History of blood transfusion 06/2016   post OR/notes 07/15/2016  . History of hiatal hernia   . History of kidney stones   . Hyperlipidemia   . Hypertension   . Melanoma of back ([place])    "mid back"  . Sigmoid diverticulitis    with perforation    Family History  Problem Relation Age of Onset  . Stroke Mother   . Stroke Brother   . Heart disease Brother     Past Surgical History:  Procedure Laterality Date  . COLON SURGERY     sigmoid  . COLOSTOMY TAKEDOWN N/A 06/28/2016   Procedure: COLOSTOMY TAKEDOWN;  Surgeon: [name], MD;  Location: [facility];  Service: General;  Laterality: N/A;  . ESOPHAGOGASTRODUODENOSCOPY N/A 10/27/2015   Procedure: ESOPHAGOGASTRODUODENOSCOPY (EGD);  Surgeon: Clarene Essex, MD;  [facility] ENDOSCOPY;  Service: Endoscopy;  Laterality: N/A;  . ESOPHAGOGASTRODUODENOSCOPY N/A 10/29/2015   Procedure: ESOPHAGOGASTRODUODENOSCOPY (EGD);  Surgeon: Ronald Lobo, MD;  Location: [facility] ENDOSCOPY;  Service: Endoscopy;  Laterality: N/A;  . ESOPHAGOGASTRODUODENOSCOPY N/A 10/30/2015   Procedure: ESOPHAGOGASTRODUODENOSCOPY (EGD);  Surgeon: Clarene Essex, MD;  Location[facility] ENDOSCOPY;  Service: Endoscopy;  Laterality: N/A;  . GASTROSTOMY TUBE PLACEMENT  11/21/2015   REDUCTION OF HIATAL HERNIA , REPAIR HIATAL HERNIA, RESECTION SMALL BOWEL WITH ANASTOMOSIS, PLACEMENT GASTROSTOMY TUBE, PLACEMENT DUODENOSTOMY TUBE (N/A)  . HEMORRHOID BANDING  X 2  . HERNIA REPAIR    . HIATAL HERNIA REPAIR N/A 11/21/2015   Procedure: REDUCTION OF HIATAL HERNIA , REPAIR HIATAL HERNIA, RESECTION SMALL BOWEL WITH ANASTOMOSIS, PLACEMENT GASTROSTOMY TUBE, PLACEMENT DUODENOSTOMY TUBE;  Surgeon: Mickeal Skinner, MD;  Location: [place];  Service: General;  Laterality: N/A;  . [place]  06/28/2016   open/notes 07/15/2016  . IR CM INJ ANY COLONIC TUBE W/FLUORO  02/04/2017  . IR GUIDED DRAIN W  CATHETER PLACEMENT  07/06/2016   /NOTES 07/15/2016  . IR RADIOLOGIST EVAL & MGMT  07/27/2016  . IR RADIOLOGIST EVAL & MGMT  08/17/2016  . IR RADIOLOGIST EVAL & MGMT  08/26/2016  . IR [place] TUBE PERCUT W/FLUORO  08/04/2016  . IR [place] TUBE PERCUT W/FLUORO  01/14/2017  . IR US GUIDE BX ASP/DRAIN  08/04/2016  . KNEE CARTILAGE SURGERY Right 1971   "opened me up"  . LAPAROTOMY N/A 07/05/2015   Procedure: PARTIAL SIGMOID COLECTOMY AND COLOSTOMY;  Surgeon: [name], MD;  Location: [place];  Service: General;  Laterality: N/A;  . MELANOMA EXCISION  2001  . 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  10/30/2015  Procedure: REMOVAL OF DISTAL STOMACH;  Surgeon: Judeth Horn, MD;  Location: [place];  Service: General;;  . REPAIR OF PERFORATED ULCER N/A 10/30/2015   Procedure: REPAIR OF BLEEDING  ULCER;  Surgeon: Judeth Horn, MD;  Location: [place];  Service: General;  Laterality: N/A;  . TUMOR EXCISION  2009   "back; fatty tumor"   Social History   Occupational History  . Occupation: unable to work since April  Tobacco Use  . Smoking status: Never Smoker  . Smokeless tobacco: Former Systems developer    Types: Snuff  Substance and Sexual Activity  . Alcohol use: No  . Drug use: No  . Sexual activity: Yes

## 2017-07-21 ENCOUNTER — Ambulatory Visit (INDEPENDENT_AMBULATORY_CARE_PROVIDER_SITE_OTHER): Payer: Self-pay | Admitting: Orthopaedic Surgery

## 2017-07-28 ENCOUNTER — Ambulatory Visit (HOSPITAL_COMMUNITY)
Admission: RE | Admit: 2017-07-28 | Discharge: 2017-07-28 | Disposition: A | Discharge status: Home / Self Care | Payer: Medicare Other | Source: Ambulatory Visit | Attending: Radiology | Admitting: Radiology

## 2017-07-28 ENCOUNTER — Other Ambulatory Visit (HOSPITAL_COMMUNITY): Payer: Self-pay | Admitting: Radiology

## 2017-07-28 ENCOUNTER — Encounter (HOSPITAL_COMMUNITY): Payer: Self-pay | Admitting: Interventional Radiology

## 2017-07-28 DIAGNOSIS — R633 Feeding difficulties, unspecified: Secondary | ICD-10-CM

## 2017-07-28 DIAGNOSIS — K9423 Gastrostomy malfunction: Secondary | ICD-10-CM | POA: Insufficient documentation

## 2017-07-28 DIAGNOSIS — K9429 Other complications of gastrostomy: Secondary | ICD-10-CM | POA: Diagnosis not present

## 2017-07-28 DIAGNOSIS — Y733 Surgical instruments, materials and gastroenterology and urology devices (including sutures) associated with adverse incidents: Secondary | ICD-10-CM | POA: Insufficient documentation

## 2017-07-28 HISTORY — PX: IR REPLACE G-TUBE SIMPLE WO FLUORO: IMG2323

## 2017-08-03 DIAGNOSIS — R110 Nausea: Secondary | ICD-10-CM | POA: Diagnosis not present

## 2017-08-03 DIAGNOSIS — K219 Gastro-esophageal reflux disease without esophagitis: Secondary | ICD-10-CM | POA: Diagnosis not present

## 2017-08-03 DIAGNOSIS — R633 Feeding difficulties: Secondary | ICD-10-CM | POA: Diagnosis not present

## 2017-08-08 DIAGNOSIS — L929 Granulomatous disorder of the skin and subcutaneous tissue, unspecified: Secondary | ICD-10-CM | POA: Diagnosis not present

## 2017-08-15 ENCOUNTER — Ambulatory Visit (INDEPENDENT_AMBULATORY_CARE_PROVIDER_SITE_OTHER): Payer: Medicare Other | Admitting: Orthopaedic Surgery

## 2017-08-15 ENCOUNTER — Encounter (INDEPENDENT_AMBULATORY_CARE_PROVIDER_SITE_OTHER): Payer: Self-pay | Admitting: Orthopaedic Surgery

## 2017-08-15 DIAGNOSIS — M65321 Trigger finger, right index finger: Secondary | ICD-10-CM

## 2017-08-15 DIAGNOSIS — M65311 Trigger thumb, right thumb: Secondary | ICD-10-CM | POA: Diagnosis not present

## 2017-08-15 MED ORDER — LIDOCAINE HCL 1 % IJ SOLN
1.0000 mL | INTRAMUSCULAR | Status: AC | PRN
  Administered 2017-08-15: 1 mL

## 2017-08-15 MED ORDER — METHYLPREDNISOLONE ACETATE 40 MG/ML IJ SUSP
40.0000 mg | INTRAMUSCULAR | Status: AC | PRN
  Administered 2017-08-15: 40 mg

## 2017-08-15 NOTE — Progress Notes (Signed)
Office Visit Note   Patient: Miguel [name]           Date of Birth: Jun 11, 1950           MRN: [id number] Visit Date: 08/15/2017              Requested by: Alroy Dust, L.Marlou Sa, [place] [address] [place] [place], [place] [address] PCP: Alroy Dust, L.Marlou Sa, MD   Assessment & Plan: Visit Diagnoses:  1. Trigger thumb, right thumb   2. Trigger finger, right index finger     Plan:Since he is done well with previous trigger point injections I agree with trying a injection of the A1 pulley of the right thumb in the right index finger.  He tolerated these well. Follow-up will be as needed.  Follow-Up Instructions: Return if symptoms worsen or fail to improve.   Orders:  Orders Placed This Encounter  Procedures  . Large Joint Inj  . Hand/UE Inj  . Hand/UE Inj   No orders of the defined types were placed in this encounter.     Procedures: Hand/UE Inj: R thumb A1 for trigger finger on 08/15/2017 10:05 AM Medications: 1 mL lidocaine 1 %; 40 mg methylPREDNISolone acetate 40 MG/ML  Hand/UE Inj: R index A1 for trigger finger on 08/15/2017 10:06 AM Details: medial approach Medications: 40 mg methylPREDNISolone acetate 40 MG/ML; 1 mL lidocaine 1 %      Clinical Data: No additional findings.   Subjective: Chief Complaint  Patient presents with  . Left Hand - Follow-up  . Right Hand - Follow-up  Patient is well-known to me.  He comes in for follow-up for his left hand trigger fingers that we have injected multiple triggering on the left hand.  We also injected his right ring finger.  He is doing well with those and would like to have a trigger steroid injection in the A1 pulley of his right thumb and right index finger.  He is doing well overall though.  He is not a diabetic.  HPI  Review of Systems He does have chronic abdominal issues and sees a Education officer, environmental for this.  Objective: Vital Signs: There were no vitals taken for this visit.  Physical Exam Is alert and  oriented no acute distress Ortho Exam Examination of his right hand shows pain over the A1 pulley of the index finger and thumb with no active triggering but just pain.  He is otherwise neurovascularly intact. Specialty Comments:  No specialty comments available.  Imaging: No results found.   PMFS History: Patient Active Problem List   Diagnosis Date Noted  . Trigger finger, left index finger 07/18/2017  . Trigger finger, right ring finger 01/31/2017  . Trigger finger of left thumb 01/03/2017  . Trigger finger of right thumb 01/03/2017  . Trigger index finger of left hand 01/03/2017  . Trigger index finger of right hand 01/03/2017  . Malnutrition of moderate degree 07/19/2016  . Intra-abdominal abscess ([place]) 07/16/2016  . Cellulitis 07/15/2016  . S/P colostomy takedown 06/28/2016  . Pain in thoracic spine 06/09/2016  . Mid back pain 06/09/2016  . Postoperative fever 11/19/2015  . 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 Severe protein-calorie malnutrition ([place]) 11/17/2015  . Sepsis ([place]) 11/14/2015  . Hiccups 11/14/2015  . AKI (acute kidney injury) ([place]) 11/14/2015  . Fever   . Leg swelling   . Left shoulder pain   . Muscle spasm of left shoulder   . GI bleed 10/27/2015  . Acute blood loss anemia  10/27/2015  . Syncope 10/27/2015  . Hyperglycemia 10/27/2015  . Hypotension 10/27/2015  . Neck pain 10/27/2015  . Hematemesis 10/27/2015  . Hematochezia 10/27/2015  . Diverticulitis of colon with perforation 07/05/2015   Past Medical History:  Diagnosis Date  . Anemia   . Anxiety   . BPH (benign prostatic hyperplasia)   . Gastritis   . GERD (gastroesophageal reflux disease)    "seldom" (07/16/2016)  . GI bleed due to NSAIDs 10/27/2015  . History of blood transfusion 06/2016   post OR/notes 07/15/2016  . History of hiatal hernia   . History of kidney stones   . Hyperlipidemia   . Hypertension   . Melanoma of back ([place])    "mid back"  . Sigmoid diverticulitis    with  perforation    Family History  Problem Relation Age of Onset  . Stroke Mother   . Stroke Brother   . Heart disease Brother     Past Surgical History:  Procedure Laterality Date  . COLON SURGERY     sigmoid  . COLOSTOMY TAKEDOWN N/A 06/28/2016   Procedure: COLOSTOMY TAKEDOWN;  Surgeon: [name], MD;  Location: [place];  Service: General;  Laterality: N/A;  . ESOPHAGOGASTRODUODENOSCOPY N/A 10/27/2015   Procedure: ESOPHAGOGASTRODUODENOSCOPY (EGD);  Surgeon: Clarene Essex, MD;  Location: [facility] ENDOSCOPY;  Service: Endoscopy;  Laterality: N/A;  . ESOPHAGOGASTRODUODENOSCOPY N/A 10/29/2015   Procedure: ESOPHAGOGASTRODUODENOSCOPY (EGD);  Surgeon: Ronald Lobo, MD;  Location: [facility] ENDOSCOPY;  Service: Endoscopy;  Laterality: N/A;  . ESOPHAGOGASTRODUODENOSCOPY N/A 10/30/2015   Procedure: ESOPHAGOGASTRODUODENOSCOPY (EGD);  Surgeon: Clarene Essex, MD;  Location: [facility] ENDOSCOPY;  Service: Endoscopy;  Laterality: N/A;  . GASTROSTOMY TUBE PLACEMENT  11/21/2015   REDUCTION OF HIATAL HERNIA , REPAIR HIATAL HERNIA, RESECTION SMALL BOWEL WITH ANASTOMOSIS, PLACEMENT GASTROSTOMY TUBE, PLACEMENT DUODENOSTOMY TUBE (N/A)  . HEMORRHOID BANDING  X 2  . HERNIA REPAIR    . HIATAL HERNIA REPAIR N/A 11/21/2015   Procedure: REDUCTION OF HIATAL HERNIA , REPAIR HIATAL HERNIA, RESECTION SMALL BOWEL WITH ANASTOMOSIS, PLACEMENT GASTROSTOMY TUBE, PLACEMENT DUODENOSTOMY TUBE;  Surgeon: Mickeal Skinner, MD;  Location: [place];  Service: General;  Laterality: N/A;  . [place]  06/28/2016   open/notes 07/15/2016  . IR CM INJ ANY COLONIC TUBE W/FLUORO  02/04/2017  . IR GUIDED DRAIN W CATHETER PLACEMENT  07/06/2016   /NOTES 07/15/2016  . IR RADIOLOGIST EVAL & MGMT  07/27/2016  . IR RADIOLOGIST EVAL & MGMT  08/17/2016  . IR RADIOLOGIST EVAL & MGMT  08/26/2016  . IR REPLACE G-TUBE SIMPLE WO FLUORO  07/28/2017  . IR [place] TUBE PERCUT W/FLUORO  08/04/2016  . IR [place] TUBE PERCUT W/FLUORO  01/14/2017    . IR US GUIDE BX ASP/DRAIN  08/04/2016  . KNEE CARTILAGE SURGERY Right 1971   "opened me up"  . LAPAROTOMY N/A 07/05/2015   Procedure: PARTIAL SIGMOID COLECTOMY AND COLOSTOMY;  Surgeon: [name], MD;  Location: [place];  Service: General;  Laterality: N/A;  . MELANOMA EXCISION  2001  . 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  10/30/2015   Procedure: REMOVAL OF DISTAL STOMACH;  Surgeon: Judeth Horn, MD;  Location: [place];  Service: General;;  . REPAIR OF PERFORATED ULCER N/A 10/30/2015   Procedure: REPAIR OF BLEEDING  ULCER;  Surgeon: Judeth Horn, MD;  Location: [place];  Service: General;  Laterality: N/A;  . TUMOR EXCISION  2009   "back; fatty tumor"   Social History   Occupational History  .  Occupation: unable to work since April  Tobacco Use  . Smoking status: Never Smoker  . Smokeless tobacco: Former Systems developer    Types: Snuff  Substance and Sexual Activity  . Alcohol use: No  . Drug use: No  . Sexual activity: Yes

## 2017-09-07 ENCOUNTER — Other Ambulatory Visit (HOSPITAL_COMMUNITY): Payer: Self-pay | Admitting: Gastroenterology

## 2017-09-07 ENCOUNTER — Other Ambulatory Visit: Payer: Self-pay | Admitting: Gastroenterology

## 2017-09-07 DIAGNOSIS — K219 Gastro-esophageal reflux disease without esophagitis: Secondary | ICD-10-CM

## 2017-09-07 DIAGNOSIS — R110 Nausea: Secondary | ICD-10-CM

## 2017-09-09 DIAGNOSIS — L929 Granulomatous disorder of the skin and subcutaneous tissue, unspecified: Secondary | ICD-10-CM | POA: Diagnosis not present

## 2017-09-13 ENCOUNTER — Other Ambulatory Visit (HOSPITAL_COMMUNITY): Payer: Self-pay | Admitting: Gastroenterology

## 2017-09-13 ENCOUNTER — Ambulatory Visit (HOSPITAL_COMMUNITY)
Admission: RE | Admit: 2017-09-13 | Discharge: 2017-09-13 | Disposition: A | Discharge status: Home / Self Care | Payer: Medicare Other | Source: Ambulatory Visit | Attending: Gastroenterology | Admitting: Gastroenterology

## 2017-09-13 DIAGNOSIS — K598 Other specified functional intestinal disorders: Secondary | ICD-10-CM | POA: Diagnosis not present

## 2017-09-13 DIAGNOSIS — R110 Nausea: Secondary | ICD-10-CM

## 2017-09-13 DIAGNOSIS — K219 Gastro-esophageal reflux disease without esophagitis: Secondary | ICD-10-CM | POA: Insufficient documentation

## 2017-10-10 DIAGNOSIS — L929 Granulomatous disorder of the skin and subcutaneous tissue, unspecified: Secondary | ICD-10-CM | POA: Diagnosis not present

## 2017-10-18 ENCOUNTER — Telehealth (INDEPENDENT_AMBULATORY_CARE_PROVIDER_SITE_OTHER): Payer: Self-pay | Admitting: Orthopedic Surgery

## 2017-10-18 NOTE — Telephone Encounter (Signed)
Pt request  Toe Spacers

## 2017-10-18 NOTE — Telephone Encounter (Signed)
We have both felt and silicone toe spacers in the hallway that we can give him some of the each

## 2017-10-18 NOTE — Telephone Encounter (Signed)
Patient called asking for toe spacers, is there a certain kind you would want for him?

## 2017-10-18 NOTE — Telephone Encounter (Signed)
Called and left VM for patient to return my call concerning toe spacers.

## 2017-10-19 NOTE — Telephone Encounter (Signed)
Talked with patient and advised him of Dr. Jess Barters message.

## 2017-10-31 DIAGNOSIS — R110 Nausea: Secondary | ICD-10-CM | POA: Diagnosis not present

## 2017-10-31 DIAGNOSIS — K219 Gastro-esophageal reflux disease without esophagitis: Secondary | ICD-10-CM | POA: Diagnosis not present

## 2017-10-31 DIAGNOSIS — R633 Feeding difficulties: Secondary | ICD-10-CM | POA: Diagnosis not present

## 2017-11-15 DIAGNOSIS — L929 Granulomatous disorder of the skin and subcutaneous tissue, unspecified: Secondary | ICD-10-CM | POA: Diagnosis not present

## 2017-12-15 DIAGNOSIS — J069 Acute upper respiratory infection, unspecified: Secondary | ICD-10-CM | POA: Diagnosis not present

## 2017-12-15 DIAGNOSIS — L929 Granulomatous disorder of the skin and subcutaneous tissue, unspecified: Secondary | ICD-10-CM | POA: Diagnosis not present

## 2017-12-30 DIAGNOSIS — Z23 Encounter for immunization: Secondary | ICD-10-CM | POA: Diagnosis not present

## 2018-01-02 ENCOUNTER — Other Ambulatory Visit: Payer: Self-pay | Admitting: Gastroenterology

## 2018-01-02 ENCOUNTER — Ambulatory Visit
Admission: RE | Admit: 2018-01-02 | Discharge: 2018-01-02 | Disposition: A | Discharge status: Home / Self Care | Payer: Medicare Other | Source: Ambulatory Visit | Attending: Gastroenterology | Admitting: Gastroenterology

## 2018-01-02 DIAGNOSIS — R633 Feeding difficulties: Secondary | ICD-10-CM | POA: Diagnosis not present

## 2018-01-02 DIAGNOSIS — R6889 Other general symptoms and signs: Secondary | ICD-10-CM

## 2018-01-02 DIAGNOSIS — R05 Cough: Secondary | ICD-10-CM | POA: Diagnosis not present

## 2018-01-02 DIAGNOSIS — R1032 Left lower quadrant pain: Secondary | ICD-10-CM | POA: Diagnosis not present

## 2018-01-02 DIAGNOSIS — K439 Ventral hernia without obstruction or gangrene: Secondary | ICD-10-CM | POA: Diagnosis not present

## 2018-01-02 DIAGNOSIS — K219 Gastro-esophageal reflux disease without esophagitis: Secondary | ICD-10-CM | POA: Diagnosis not present

## 2018-01-16 DIAGNOSIS — Z934 Other artificial openings of gastrointestinal tract status: Secondary | ICD-10-CM | POA: Diagnosis not present

## 2018-01-16 DIAGNOSIS — R6889 Other general symptoms and signs: Secondary | ICD-10-CM | POA: Diagnosis not present

## 2018-01-19 ENCOUNTER — Encounter (INDEPENDENT_AMBULATORY_CARE_PROVIDER_SITE_OTHER): Payer: Self-pay | Admitting: Orthopaedic Surgery

## 2018-01-19 ENCOUNTER — Ambulatory Visit (INDEPENDENT_AMBULATORY_CARE_PROVIDER_SITE_OTHER): Payer: Medicare Other | Admitting: Orthopaedic Surgery

## 2018-01-19 DIAGNOSIS — M65312 Trigger thumb, left thumb: Secondary | ICD-10-CM

## 2018-01-19 DIAGNOSIS — M65341 Trigger finger, right ring finger: Secondary | ICD-10-CM

## 2018-01-19 DIAGNOSIS — N50812 Left testicular pain: Secondary | ICD-10-CM | POA: Diagnosis not present

## 2018-01-19 MED ORDER — METHYLPREDNISOLONE ACETATE 40 MG/ML IJ SUSP
40.0000 mg | INTRAMUSCULAR | Status: AC | PRN
  Administered 2018-01-19: 40 mg

## 2018-01-19 MED ORDER — LIDOCAINE HCL 1 % IJ SOLN
1.0000 mL | INTRAMUSCULAR | Status: AC | PRN
  Administered 2018-01-19: 1 mL

## 2018-01-19 NOTE — Progress Notes (Signed)
Office Visit Note   Patient: Miguel [name]           Date of Birth: 1950/10/31           MRN: [id number] Visit Date: 01/19/2018              Requested by: Alroy Dust, L.Marlou Sa, [place] [address] [employer] [address] [place], [place] [address] PCP: Alroy Dust, L.Marlou Sa, MD   Assessment & Plan: Visit Diagnoses:  1. Trigger thumb, left thumb   2. Trigger finger, right ring finger     Plan: He has had injections for trigger fingers in the past and wishes to have these again today.  We provided these in the right ring finger at the A1 pulley in the left thumb at the A1 pulley without difficulty.  All question concerns were answered addressed.  Follow-up will be as needed.  Follow-Up Instructions: Return if symptoms worsen or fail to improve.   Orders:  No orders of the defined types were placed in this encounter.  No orders of the defined types were placed in this encounter.     Procedures: Hand/UE Inj: L thumb A1 for trigger finger on 01/19/2018 3:26 PM Medications: 1 mL lidocaine 1 %; 40 mg methylPREDNISolone acetate 40 MG/ML  Hand/UE Inj: R ring A1 for trigger finger on 01/19/2018 3:26 PM Medications: 1 mL lidocaine 1 %; 40 mg methylPREDNISolone acetate 40 MG/ML      Clinical Data: No additional findings.   Subjective: Chief Complaint  Patient presents with  . Right Hand - Pain  . Left Hand - Pain  The patient is well-known to me.  He has had triggering of different fingers before.  He comes in today with active triggering of his right hand ring finger at the A1 pulley with triggering also of his left thumb and pain at the A1 pulley.  He would like to have injections in both these areas today.  He has had no other changes in medical status.  HPI  Review of Systems He currently denies any headache or shortness of breath.  He denies any fever, chills, nausea, vomiting.  Objective: Vital Signs: There were no vitals taken for this visit.  Physical Exam He is alert and oriented  in no acute distress Ortho Exam Examination of his right hand shows active triggering of the ring finger and pain over the A1 pulley.  Examination of his left thumb shows pain over the A1 pulley but no triggering. Specialty Comments:  No specialty comments available.  Imaging: No results found.   PMFS History: Patient Active Problem List   Diagnosis Date Noted  . Trigger thumb, left thumb 01/19/2018  . Trigger finger, left index finger 07/18/2017  . Trigger finger, right ring finger 01/31/2017  . Trigger finger of left thumb 01/03/2017  . Trigger finger of right thumb 01/03/2017  . Trigger index finger of left hand 01/03/2017  . Trigger index finger of right hand 01/03/2017  . Malnutrition of moderate degree 07/19/2016  . Intra-abdominal abscess ([place]) 07/16/2016  . Cellulitis 07/15/2016  . S/P colostomy takedown 06/28/2016  . Pain in thoracic spine 06/09/2016  . Mid back pain 06/09/2016  . Postoperative fever 11/19/2015  . 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 Severe protein-calorie malnutrition ([place]) 11/17/2015  . Sepsis ([place]) 11/14/2015  . Hiccups 11/14/2015  . AKI (acute kidney injury) ([place]) 11/14/2015  . Fever   . Leg swelling   . Left shoulder pain   . Muscle spasm of left shoulder   .  GI bleed 10/27/2015  . Acute blood loss anemia 10/27/2015  . Syncope 10/27/2015  . Hyperglycemia 10/27/2015  . Hypotension 10/27/2015  . Neck pain 10/27/2015  . Hematemesis 10/27/2015  . Hematochezia 10/27/2015  . Diverticulitis of colon with perforation 07/05/2015   Past Medical History:  Diagnosis Date  . Anemia   . Anxiety   . BPH (benign prostatic hyperplasia)   . Gastritis   . GERD (gastroesophageal reflux disease)    "seldom" (07/16/2016)  . GI bleed due to NSAIDs 10/27/2015  . History of blood transfusion 06/2016   post OR/notes 07/15/2016  . History of hiatal hernia   . History of kidney stones   . Hyperlipidemia   . Hypertension   . Melanoma of back ([place])     "mid back"  . Sigmoid diverticulitis    with perforation    Family History  Problem Relation Age of Onset  . Stroke Mother   . Stroke Brother   . Heart disease Brother     Past Surgical History:  Procedure Laterality Date  . COLON SURGERY     sigmoid  . COLOSTOMY TAKEDOWN N/A 06/28/2016   Procedure: COLOSTOMY TAKEDOWN;  Surgeon: [name], MD;  Location: [place];  Service: General;  Laterality: N/A;  . ESOPHAGOGASTRODUODENOSCOPY N/A 10/27/2015   Procedure: ESOPHAGOGASTRODUODENOSCOPY (EGD);  Surgeon: Clarene Essex, MD;  Location[facility] ENDOSCOPY;  Service: Endoscopy;  Laterality: N/A;  . ESOPHAGOGASTRODUODENOSCOPY N/A 10/29/2015   Procedure: ESOPHAGOGASTRODUODENOSCOPY (EGD);  Surgeon: Ronald Lobo, MD;  Location: [facility] ENDOSCOPY;  Service: Endoscopy;  Laterality: N/A;  . ESOPHAGOGASTRODUODENOSCOPY N/A 10/30/2015   Procedure: ESOPHAGOGASTRODUODENOSCOPY (EGD);  Surgeon: Clarene Essex, MD;  Location[facility] END[facility]COPY;  Service: Endoscopy;  Laterality: N/A;  . GASTROSTOMY TUBE PLACEMENT  11/21/2015   REDUCTION OF HIATAL HERNIA , REPAIR HIATAL HERNIA, RESECTION SMALL BOWEL WITH ANASTOMOSIS, PLACEMENT GASTROSTOMY TUBE, PLACEMENT DUODENOSTOMY TUBE (N/A)  . HEMORRHOID BANDING  X 2  . HERNIA REPAIR    . HIATAL HERNIA REPAIR N/A 11/21/2015   Procedure: REDUCTION OF HIATAL HERNIA , REPAIR HIATAL HERNIA, RESECTION SMALL BOWEL WITH ANASTOMOSIS, PLACEMENT GASTROSTOMY TUBE, PLACEMENT DUODENOSTOMY TUBE;  Surgeon: Mickeal Skinner, MD;  Location: [place];  Service: General;  Laterality: N/A;  . [place]  06/28/2016   open/notes 07/15/2016  . IR CM INJ ANY COLONIC TUBE W/FLUORO  02/04/2017  . IR GUIDED DRAIN W CATHETER PLACEMENT  07/06/2016   /NOTES 07/15/2016  . IR RADIOLOGIST EVAL & MGMT  07/27/2016  . IR RADIOLOGIST EVAL & MGMT  08/17/2016  . IR RADIOLOGIST EVAL & MGMT  08/26/2016  . IR REPLACE G-TUBE SIMPLE WO FLUORO  07/28/2017  . IR [place] TUBE PERCUT W/FLUORO  08/04/2016  . IR [place] TUBE PERCUT W/FLUORO  01/14/2017  . IR US GUIDE BX ASP/DRAIN  08/04/2016  . KNEE CARTILAGE SURGERY Right 1971   "opened me up"  . LAPAROTOMY N/A 07/05/2015   Procedure: PARTIAL SIGMOID COLECTOMY AND COLOSTOMY;  Surgeon: [name], MD;  Location: [place];  Service: General;  Laterality: N/A;  . MELANOMA EXCISION  2001  . 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  10/30/2015   Procedure: REMOVAL OF DISTAL STOMACH;  Surgeon: Judeth Horn, MD;  Location: [place];  Service: General;;  . REPAIR OF PERFORATED ULCER N/A 10/30/2015   Procedure: REPAIR OF BLEEDING  ULCER;  Surgeon: Judeth Horn, MD;  Location: [place];  Service: General;  Laterality: N/A;  . TUMOR EXCISION  2009   "back; fatty tumor"  Social History   Occupational History  . Occupation: unable to work since April  Tobacco Use  . Smoking status: Never Smoker  . Smokeless tobacco: Former Systems developer    Types: Snuff  Substance and Sexual Activity  . Alcohol use: No  . Drug use: No  . Sexual activity: Yes

## 2018-01-24 DIAGNOSIS — K432 Incisional hernia without obstruction or gangrene: Secondary | ICD-10-CM | POA: Diagnosis not present

## 2018-01-25 ENCOUNTER — Other Ambulatory Visit: Payer: Self-pay | Admitting: Surgery

## 2018-01-25 DIAGNOSIS — K432 Incisional hernia without obstruction or gangrene: Secondary | ICD-10-CM

## 2018-01-31 ENCOUNTER — Ambulatory Visit (HOSPITAL_COMMUNITY)
Admission: RE | Admit: 2018-01-31 | Discharge: 2018-01-31 | Disposition: A | Discharge status: Home / Self Care | Payer: Medicare Other | Source: Ambulatory Visit | Attending: Radiology | Admitting: Radiology

## 2018-01-31 ENCOUNTER — Encounter (HOSPITAL_COMMUNITY): Payer: Self-pay | Admitting: Radiology

## 2018-01-31 DIAGNOSIS — Y733 Surgical instruments, materials and gastroenterology and urology devices (including sutures) associated with adverse incidents: Secondary | ICD-10-CM | POA: Diagnosis not present

## 2018-01-31 DIAGNOSIS — R633 Feeding difficulties, unspecified: Secondary | ICD-10-CM

## 2018-01-31 DIAGNOSIS — K9423 Gastrostomy malfunction: Secondary | ICD-10-CM | POA: Insufficient documentation

## 2018-01-31 HISTORY — PX: IR REPLACE G-TUBE SIMPLE WO FLUORO: IMG2323

## 2018-01-31 MED ORDER — LIDOCAINE VISCOUS HCL 2 % MT SOLN
OROMUCOSAL | Status: AC
  Administered 2018-01-31: 7 mL
  Filled 2018-01-31: qty 15

## 2018-02-01 ENCOUNTER — Ambulatory Visit
Admission: RE | Admit: 2018-02-01 | Discharge: 2018-02-01 | Disposition: A | Discharge status: Home / Self Care | Payer: Medicare Other | Source: Ambulatory Visit | Attending: Surgery | Admitting: Surgery

## 2018-02-01 DIAGNOSIS — K432 Incisional hernia without obstruction or gangrene: Secondary | ICD-10-CM

## 2018-02-01 DIAGNOSIS — N132 Hydronephrosis with renal and ureteral calculous obstruction: Secondary | ICD-10-CM | POA: Diagnosis not present

## 2018-02-01 DIAGNOSIS — K439 Ventral hernia without obstruction or gangrene: Secondary | ICD-10-CM | POA: Diagnosis not present

## 2018-02-01 MED ORDER — IOPAMIDOL (ISOVUE-300) INJECTION 61%
100.0000 mL | Freq: Once | INTRAVENOUS | Status: AC | PRN
  Administered 2018-02-01: 100 mL via INTRAVENOUS

## 2018-02-02 DIAGNOSIS — N281 Cyst of kidney, acquired: Secondary | ICD-10-CM | POA: Diagnosis not present

## 2018-02-02 DIAGNOSIS — N202 Calculus of kidney with calculus of ureter: Secondary | ICD-10-CM | POA: Diagnosis not present

## 2018-02-09 DIAGNOSIS — N201 Calculus of ureter: Secondary | ICD-10-CM | POA: Diagnosis not present

## 2018-02-14 DIAGNOSIS — N202 Calculus of kidney with calculus of ureter: Secondary | ICD-10-CM | POA: Diagnosis not present

## 2018-02-17 ENCOUNTER — Other Ambulatory Visit: Payer: Self-pay | Admitting: Surgery

## 2018-02-17 DIAGNOSIS — K432 Incisional hernia without obstruction or gangrene: Secondary | ICD-10-CM | POA: Diagnosis not present

## 2018-02-27 DIAGNOSIS — N202 Calculus of kidney with calculus of ureter: Secondary | ICD-10-CM | POA: Diagnosis not present

## 2018-02-27 DIAGNOSIS — N23 Unspecified renal colic: Secondary | ICD-10-CM | POA: Diagnosis not present

## 2018-03-08 NOTE — Pre-Procedure Instructions (Signed)
Miguel [name]  03/08/2018      [id number] - [place], [place] - [address] [name] [address] Phone: [phone] Fax: [phone]    Your procedure is scheduled on March 17, 2018.  Report to [facility] Admitting at 1215 PM.  Call this number if you have problems the morning of surgery:  [phone]   Remember:  Do not eat or drink after midnight.  You may drink clear liquids until 1115 AM.  Clear liquids allowed are:   Water, Juice (non-citric and without pulp), Clear Tea, Black Coffee only and Gatorade    Take these medicines the morning of surgery with A SIP OF WATER  Pantoprazole (protonix) Tylenol-if needed Oxycodone-If needed for pain Zofran-if needed for nausea tamsulosin (flomax) Tizanidine (zanaflex) Tramadol (ultram)-if needed for pain  Follow your surgeon's instructions on when to hold/resume aspirin.  If no instructions were given call the office to determine how they would like to you take aspirin  7 days prior to surgery STOP taking any Voltaren (diclofenac) gel, Aleve, Naproxen, Ibuprofen, Motrin, Advil, Goody's, BC's, all herbal medications, fish oil, and all vitamins    Do not wear jewelry  Do not wear lotions, powders, or colognes, or deodorant.  Men may shave face and neck.  Do not bring valuables to the hospital.  [facility] is not responsible for any belongings or valuables.  Contacts, dentures or bridgework may not be worn into surgery.  Leave your suitcase in the car.  After surgery it may be brought to your room.  For patients admitted to the hospital, discharge time will be determined by your treatment team.  Patients discharged the day of surgery will not be allowed to drive home.    [place]- Preparing For Surgery  Before surgery, you can play an important role. Because skin is not sterile, your skin needs to be as free of germs as  possible. You can reduce the number of germs on your skin by washing with CHG (chlorahexidine gluconate) Soap before surgery.  CHG is an antiseptic cleaner which kills germs and bonds with the skin to continue killing germs even after washing.    Oral Hygiene is also important to reduce your risk of infection.  Remember - BRUSH YOUR TEETH THE MORNING OF SURGERY WITH YOUR REGULAR TOOTHPASTE  Please do not use if you have an allergy to CHG or antibacterial soaps. If your skin becomes reddened/irritated stop using the CHG.  Do not shave (including legs and underarms) for at least 48 hours prior to first CHG shower. It is OK to shave your face.  Please follow these instructions carefully.   1. Shower the NIGHT BEFORE SURGERY and the MORNING OF SURGERY with CHG.   2. If you chose to wash your hair, wash your hair first as usual with your normal shampoo.  3. After you shampoo, rinse your hair and body thoroughly to remove the shampoo.  4. Use CHG as you would any other liquid soap. You can apply CHG directly to the skin and wash gently with a scrungie or a clean washcloth.   5. Apply the CHG Soap to your body ONLY FROM THE NECK DOWN.  Do not use on open wounds or open sores. Avoid contact with your eyes, ears, mouth and genitals (private parts). Wash Face and genitals (private parts)  with your normal soap.  6. Wash thoroughly, paying special attention to  the area where your surgery will be performed.  7. Thoroughly rinse your body with warm water from the neck down.  8. DO NOT shower/wash with your normal soap after using and rinsing off the CHG Soap.  9. Pat yourself dry with a CLEAN TOWEL.  10. Wear CLEAN PAJAMAS to bed the night before surgery, wear comfortable clothes the morning of surgery  11. Place CLEAN SHEETS on your bed the night of your first shower and DO NOT SLEEP WITH PETS.  Day of Surgery:  Do not apply any deodorants/lotions.  Please wear clean clothes to the  hospital/surgery center.   Remember to brush your teeth WITH YOUR REGULAR TOOTHPASTE.  Please read over the following fact sheets that you were given.

## 2018-03-09 ENCOUNTER — Encounter (HOSPITAL_COMMUNITY)
Admission: RE | Admit: 2018-03-09 | Discharge: 2018-03-09 | Disposition: A | Discharge status: Home / Self Care | Payer: Medicare Other | Source: Ambulatory Visit | Attending: Surgery | Admitting: Surgery

## 2018-03-09 ENCOUNTER — Encounter (HOSPITAL_COMMUNITY): Payer: Self-pay

## 2018-03-09 ENCOUNTER — Other Ambulatory Visit: Payer: Self-pay

## 2018-03-09 DIAGNOSIS — Z01818 Encounter for other preprocedural examination: Secondary | ICD-10-CM | POA: Insufficient documentation

## 2018-03-09 LAB — CBC
HCT: 39.8 % (ref 39.0–52.0)
Hemoglobin: 12.9 g/dL — ABNORMAL LOW (ref 13.0–17.0)
MCH: 30.6 pg (ref 26.0–34.0)
MCHC: 32.4 g/dL (ref 30.0–36.0)
MCV: 94.5 fL (ref 80.0–100.0)
Platelets: 203 10*3/uL (ref 150–400)
RBC: 4.21 MIL/uL — ABNORMAL LOW (ref 4.22–5.81)
RDW: 12.9 % (ref 11.5–15.5)
WBC: 5.8 10*3/uL (ref 4.0–10.5)
nRBC: 0 % (ref 0.0–0.2)

## 2018-03-09 LAB — BASIC METABOLIC PANEL
Anion gap: 10 (ref 5–15)
BUN: 11 mg/dL (ref 8–23)
CO2: 30 mmol/L (ref 22–32)
Calcium: 9.5 mg/dL (ref 8.9–10.3)
Chloride: 98 mmol/L (ref 98–111)
Creatinine, Ser: 1.07 mg/dL (ref 0.61–1.24)
GFR calc Af Amer: >60 mL/min (ref >60)
GFR calc non Af Amer: >60 mL/min (ref >60)
Glucose, Bld: 106 mg/dL — ABNORMAL HIGH (ref 70–99)
Potassium: 4.7 mmol/L (ref 3.5–5.1)
Sodium: 138 mmol/L (ref 135–145)

## 2018-03-09 NOTE — Progress Notes (Addendum)
PCP: Donnie Coffin, MD  Cardiologist: pt denies  EKG: denies past year  Stress test: pt denies  ECHO: pt denies  Cardiac Cath: pt denies  Chest x-ray: pt denies past year, no recent respiratory infections/complications  Patient has tube feedings at night which are not clear liquids, I advised that I did not believe he should do these feedings after midnight but he would like to clarify with Dr. Ninfa Linden.    Patient is on aspirin 81 mg. He was given no instructions to stop medication and I advised when he called Dr. Trevor Mace office to ask about his tube feedings to get clarification on when/if he needs to stop aspirin 81 mg.  He verbalized understanding and said he would call as soon as possible

## 2018-03-10 NOTE — Anesthesia Preprocedure Evaluation (Addendum)
Anesthesia Evaluation  Patient identified by MRN, date of birth, ID band Patient awake    Reviewed: Allergy & Precautions, NPO status , Patient's Chart, lab work & pertinent test results  Airway Mallampati: II  TM Distance: >3 FB Neck ROM: Full    Dental  (+) Dental Advisory Given,    Pulmonary neg pulmonary ROS,    breath sounds clear to auscultation       Cardiovascular hypertension, Pt. on medications  Rhythm:Regular Rate:Normal     Neuro/Psych negative neurological ROS     GI/Hepatic Neg liver ROS, hiatal hernia, GERD  ,  Endo/Other  negative endocrine ROS  Renal/GU Renal disease     Musculoskeletal   Abdominal   Peds  Hematology  (+) Blood dyscrasia, anemia ,   Anesthesia Other Findings   Reproductive/Obstetrics                           Lab Results  Component Value Date   WBC 5.8 03/09/2018   HGB 12.9 (L) 03/09/2018   HCT 39.8 03/09/2018   MCV 94.5 03/09/2018   PLT 203 03/09/2018   Lab Results  Component Value Date   CREATININE 1.07 03/09/2018   BUN 11 03/09/2018   NA 138 03/09/2018   K 4.7 03/09/2018   CL 98 03/09/2018   CO2 30 03/09/2018    Anesthesia Physical Anesthesia Plan  ASA: III  Anesthesia Plan: General   Post-op Pain Management:    Induction: Intravenous  PONV Risk Score and Plan: 2 and Dexamethasone, Ondansetron and Treatment may vary due to age or medical condition  Airway Management Planned: Oral ETT  Additional Equipment:   Intra-op Plan:   Post-operative Plan: Extubation in OR  Informed Consent: I have reviewed the patients History and Physical, chart, labs and discussed the procedure including the risks, benefits and alternatives for the proposed anesthesia with the patient or authorized representative who has indicated his/her understanding and acceptance.   Dental advisory given  Plan Discussed with:   Anesthesia Plan Comments: ( He  has a feeding tube in place for the past 2 years for supplemental nutrition. Pt usually does supplemental tube feedings at night. Advised by PAT nurse not to administer any tube feedings after midnight. He has GERD and esophageal dysmotility. Recent laryngoscopy by Dr. Janace Hoard showed: The nasal cavity and nasopharynx are unremarkable. The tongue base, pharyngeal walls, piriform sinuses, vallecula, epiglottis and postcricoid region are normal in appearance. The visualized portion of the subglottis and proximal trachea is widely patent. The vocal folds are mobile bilaterally. There are no lesions on the free edge of the vocal folds nor elsewhere in the larynx worrisome for malignancy. There is no inflammation. There is no glottal insufficiency. )       Anesthesia Quick Evaluation

## 2018-03-13 DIAGNOSIS — N202 Calculus of kidney with calculus of ureter: Secondary | ICD-10-CM | POA: Diagnosis not present

## 2018-03-14 DIAGNOSIS — K219 Gastro-esophageal reflux disease without esophagitis: Secondary | ICD-10-CM | POA: Diagnosis not present

## 2018-03-14 DIAGNOSIS — E7800 Pure hypercholesterolemia, unspecified: Secondary | ICD-10-CM | POA: Diagnosis not present

## 2018-03-14 DIAGNOSIS — R972 Elevated prostate specific antigen [PSA]: Secondary | ICD-10-CM | POA: Diagnosis not present

## 2018-03-14 DIAGNOSIS — K439 Ventral hernia without obstruction or gangrene: Secondary | ICD-10-CM | POA: Diagnosis not present

## 2018-03-14 DIAGNOSIS — F419 Anxiety disorder, unspecified: Secondary | ICD-10-CM | POA: Diagnosis not present

## 2018-03-14 DIAGNOSIS — G4700 Insomnia, unspecified: Secondary | ICD-10-CM | POA: Diagnosis not present

## 2018-03-14 DIAGNOSIS — M545 Low back pain: Secondary | ICD-10-CM | POA: Diagnosis not present

## 2018-03-17 ENCOUNTER — Encounter (HOSPITAL_COMMUNITY): Payer: Self-pay | Admitting: Urology

## 2018-03-17 ENCOUNTER — Inpatient Hospital Stay (HOSPITAL_COMMUNITY)
Admission: RE | Admit: 2018-03-17 | Discharge: 2018-03-21 | DRG: 355 | Disposition: A | Discharge status: Home / Self Care | Payer: Medicare Other | Attending: Surgery | Admitting: Surgery

## 2018-03-17 ENCOUNTER — Inpatient Hospital Stay (HOSPITAL_COMMUNITY): Payer: Medicare Other | Admitting: Anesthesiology

## 2018-03-17 ENCOUNTER — Encounter (HOSPITAL_COMMUNITY)
Admission: RE | Disposition: A | Discharge status: Home / Self Care | Payer: Self-pay | Source: Home / Self Care | Attending: Surgery

## 2018-03-17 ENCOUNTER — Inpatient Hospital Stay (HOSPITAL_COMMUNITY): Payer: Medicare Other | Admitting: Physician Assistant

## 2018-03-17 DIAGNOSIS — K432 Incisional hernia without obstruction or gangrene: Principal | ICD-10-CM | POA: Diagnosis present

## 2018-03-17 DIAGNOSIS — Z931 Gastrostomy status: Secondary | ICD-10-CM

## 2018-03-17 DIAGNOSIS — Z85820 Personal history of malignant melanoma of skin: Secondary | ICD-10-CM

## 2018-03-17 DIAGNOSIS — E785 Hyperlipidemia, unspecified: Secondary | ICD-10-CM | POA: Diagnosis present

## 2018-03-17 DIAGNOSIS — Z823 Family history of stroke: Secondary | ICD-10-CM

## 2018-03-17 DIAGNOSIS — I129 Hypertensive chronic kidney disease with stage 1 through stage 4 chronic kidney disease, or unspecified chronic kidney disease: Secondary | ICD-10-CM | POA: Diagnosis not present

## 2018-03-17 DIAGNOSIS — Z8249 Family history of ischemic heart disease and other diseases of the circulatory system: Secondary | ICD-10-CM | POA: Diagnosis not present

## 2018-03-17 DIAGNOSIS — I10 Essential (primary) hypertension: Secondary | ICD-10-CM | POA: Diagnosis present

## 2018-03-17 DIAGNOSIS — Z87891 Personal history of nicotine dependence: Secondary | ICD-10-CM

## 2018-03-17 DIAGNOSIS — K219 Gastro-esophageal reflux disease without esophagitis: Secondary | ICD-10-CM | POA: Diagnosis present

## 2018-03-17 DIAGNOSIS — Z903 Acquired absence of stomach [part of]: Secondary | ICD-10-CM | POA: Diagnosis not present

## 2018-03-17 DIAGNOSIS — I959 Hypotension, unspecified: Secondary | ICD-10-CM | POA: Diagnosis not present

## 2018-03-17 DIAGNOSIS — Z9049 Acquired absence of other specified parts of digestive tract: Secondary | ICD-10-CM

## 2018-03-17 DIAGNOSIS — D649 Anemia, unspecified: Secondary | ICD-10-CM | POA: Diagnosis present

## 2018-03-17 DIAGNOSIS — N189 Chronic kidney disease, unspecified: Secondary | ICD-10-CM | POA: Diagnosis not present

## 2018-03-17 HISTORY — PX: INCISIONAL HERNIA REPAIR: SHX193

## 2018-03-17 HISTORY — PX: INSERTION OF MESH: SHX5868

## 2018-03-17 SURGERY — REPAIR, HERNIA, INCISIONAL
Anesthesia: General | Site: Abdomen

## 2018-03-17 MED ORDER — GLYCOPYRROLATE PF 0.2 MG/ML IJ SOSY
PREFILLED_SYRINGE | INTRAMUSCULAR | Status: AC
  Filled 2018-03-17: qty 1

## 2018-03-17 MED ORDER — LACTATED RINGERS IV SOLN
INTRAVENOUS | Status: DC
  Administered 2018-03-17 (×2): via INTRAVENOUS

## 2018-03-17 MED ORDER — EPHEDRINE 5 MG/ML INJ
INTRAVENOUS | Status: AC
  Filled 2018-03-17: qty 10

## 2018-03-17 MED ORDER — DEXAMETHASONE SODIUM PHOSPHATE 10 MG/ML IJ SOLN
INTRAMUSCULAR | Status: AC
  Filled 2018-03-17: qty 1

## 2018-03-17 MED ORDER — EPHEDRINE SULFATE-NACL 50-0.9 MG/10ML-% IV SOSY
PREFILLED_SYRINGE | INTRAVENOUS | Status: DC | PRN
  Administered 2018-03-17 (×2): 10 mg via INTRAVENOUS

## 2018-03-17 MED ORDER — PROMETHAZINE HCL 25 MG/ML IJ SOLN
INTRAMUSCULAR | Status: AC
  Administered 2018-03-17: 6.25 mg via INTRAVENOUS
  Filled 2018-03-17: qty 1

## 2018-03-17 MED ORDER — ACETAMINOPHEN 500 MG PO TABS
ORAL_TABLET | ORAL | Status: AC
  Administered 2018-03-17: 1000 mg via ORAL
  Filled 2018-03-17: qty 2

## 2018-03-17 MED ORDER — METHOCARBAMOL 500 MG PO TABS: 500.0000 mg | ORAL_TABLET | Freq: Four times a day (QID) | ORAL | Status: DC | PRN

## 2018-03-17 MED ORDER — FENTANYL CITRATE (PF) 250 MCG/5ML IJ SOLN
INTRAMUSCULAR | Status: AC
  Filled 2018-03-17: qty 5

## 2018-03-17 MED ORDER — OXYCODONE HCL 5 MG PO TABS
5.0000 mg | ORAL_TABLET | ORAL | Status: DC | PRN
  Administered 2018-03-20 – 2018-03-21 (×2): 10 mg via ORAL
  Filled 2018-03-17 (×2): qty 2

## 2018-03-17 MED ORDER — SODIUM CHLORIDE 0.9 % IV SOLN
INTRAVENOUS | Status: DC | PRN
  Administered 2018-03-17: 50 ug/min via INTRAVENOUS

## 2018-03-17 MED ORDER — ONDANSETRON 4 MG PO TBDP
4.0000 mg | ORAL_TABLET | Freq: Four times a day (QID) | ORAL | Status: DC | PRN
  Administered 2018-03-20 – 2018-03-21 (×2): 4 mg via ORAL
  Filled 2018-03-17 (×2): qty 1

## 2018-03-17 MED ORDER — GABAPENTIN 300 MG PO CAPS
ORAL_CAPSULE | ORAL | Status: AC
  Administered 2018-03-17: 300 mg via ORAL
  Filled 2018-03-17: qty 1

## 2018-03-17 MED ORDER — BUPIVACAINE LIPOSOME 1.3 % IJ SUSP
INTRAMUSCULAR | Status: DC | PRN
  Administered 2018-03-17: 20 mL

## 2018-03-17 MED ORDER — BUPIVACAINE LIPOSOME 1.3 % IJ SUSP
10.0000 mL | Freq: Once | INTRAMUSCULAR | Status: DC
  Filled 2018-03-17: qty 10

## 2018-03-17 MED ORDER — ROCURONIUM BROMIDE 50 MG/5ML IV SOSY
PREFILLED_SYRINGE | INTRAVENOUS | Status: AC
  Filled 2018-03-17: qty 5

## 2018-03-17 MED ORDER — ONDANSETRON HCL 4 MG/2ML IJ SOLN
4.0000 mg | Freq: Four times a day (QID) | INTRAMUSCULAR | Status: DC | PRN
  Administered 2018-03-17 – 2018-03-18 (×3): 4 mg via INTRAVENOUS
  Filled 2018-03-17 (×8): qty 2

## 2018-03-17 MED ORDER — OSMOLITE 1.5 CAL PO LIQD
1000.0000 mL | ORAL | Status: DC
  Administered 2018-03-18 – 2018-03-21 (×2): 1000 mL
  Filled 2018-03-17 (×3): qty 1000

## 2018-03-17 MED ORDER — CEFAZOLIN SODIUM-DEXTROSE 2-4 GM/100ML-% IV SOLN
2.0000 g | INTRAVENOUS | Status: AC
  Administered 2018-03-17: 2 g via INTRAVENOUS

## 2018-03-17 MED ORDER — PHENYLEPHRINE 40 MCG/ML (10ML) SYRINGE FOR IV PUSH (FOR BLOOD PRESSURE SUPPORT)
PREFILLED_SYRINGE | INTRAVENOUS | Status: AC
  Filled 2018-03-17: qty 10

## 2018-03-17 MED ORDER — DEXAMETHASONE SODIUM PHOSPHATE 10 MG/ML IJ SOLN
INTRAMUSCULAR | Status: DC | PRN
  Administered 2018-03-17: 10 mg via INTRAVENOUS

## 2018-03-17 MED ORDER — BUPIVACAINE-EPINEPHRINE 0.25% -1:200000 IJ SOLN
INTRAMUSCULAR | Status: DC | PRN
  Administered 2018-03-17: 30 mL

## 2018-03-17 MED ORDER — TAMSULOSIN HCL 0.4 MG PO CAPS
0.4000 mg | ORAL_CAPSULE | Freq: Every day | ORAL | Status: DC
  Administered 2018-03-18 – 2018-03-20 (×3): 0.4 mg via ORAL
  Filled 2018-03-17 (×3): qty 1

## 2018-03-17 MED ORDER — FENTANYL CITRATE (PF) 100 MCG/2ML IJ SOLN
25.0000 ug | INTRAMUSCULAR | Status: DC | PRN
  Administered 2018-03-17: 50 ug via INTRAVENOUS

## 2018-03-17 MED ORDER — ALBUMIN HUMAN 5 % IV SOLN
INTRAVENOUS | Status: DC | PRN
  Administered 2018-03-17: 16:00:00 via INTRAVENOUS

## 2018-03-17 MED ORDER — ENOXAPARIN SODIUM 40 MG/0.4ML ~~LOC~~ SOLN
40.0000 mg | SUBCUTANEOUS | Status: DC
  Administered 2018-03-18 – 2018-03-20 (×3): 40 mg via SUBCUTANEOUS
  Filled 2018-03-17 (×3): qty 0.4

## 2018-03-17 MED ORDER — CEFAZOLIN SODIUM-DEXTROSE 2-4 GM/100ML-% IV SOLN
INTRAVENOUS | Status: AC
  Filled 2018-03-17: qty 100

## 2018-03-17 MED ORDER — SUGAMMADEX SODIUM 200 MG/2ML IV SOLN
INTRAVENOUS | Status: DC | PRN
  Administered 2018-03-17: 150 mg via INTRAVENOUS

## 2018-03-17 MED ORDER — TRAMADOL HCL 50 MG PO TABS: 50.0000 mg | ORAL_TABLET | Freq: Four times a day (QID) | ORAL | Status: DC | PRN

## 2018-03-17 MED ORDER — DIPHENHYDRAMINE HCL 12.5 MG/5ML PO ELIX: 12.5000 mg | ORAL_SOLUTION | Freq: Four times a day (QID) | ORAL | Status: DC | PRN

## 2018-03-17 MED ORDER — CHLORHEXIDINE GLUCONATE CLOTH 2 % EX PADS: 6.0000 | MEDICATED_PAD | Freq: Once | CUTANEOUS | Status: DC

## 2018-03-17 MED ORDER — BUPIVACAINE LIPOSOME 1.3 % IJ SUSP
20.0000 mL | INTRAMUSCULAR | Status: DC
  Filled 2018-03-17: qty 20

## 2018-03-17 MED ORDER — GABAPENTIN 300 MG PO CAPS
300.0000 mg | ORAL_CAPSULE | ORAL | Status: AC
  Administered 2018-03-17: 300 mg via ORAL

## 2018-03-17 MED ORDER — ONDANSETRON HCL 4 MG/2ML IJ SOLN
INTRAMUSCULAR | Status: DC | PRN
  Administered 2018-03-17: 4 mg via INTRAVENOUS

## 2018-03-17 MED ORDER — MIDAZOLAM HCL 5 MG/5ML IJ SOLN
INTRAMUSCULAR | Status: DC | PRN
  Administered 2018-03-17: 2 mg via INTRAVENOUS

## 2018-03-17 MED ORDER — NAPROXEN 250 MG PO TABS: 500.0000 mg | ORAL_TABLET | Freq: Two times a day (BID) | ORAL | Status: DC | PRN

## 2018-03-17 MED ORDER — 0.9 % SODIUM CHLORIDE (POUR BTL) OPTIME
TOPICAL | Status: DC | PRN
  Administered 2018-03-17: 1000 mL

## 2018-03-17 MED ORDER — FENTANYL CITRATE (PF) 100 MCG/2ML IJ SOLN
INTRAMUSCULAR | Status: AC
  Administered 2018-03-17: 50 ug via INTRAVENOUS
  Filled 2018-03-17: qty 2

## 2018-03-17 MED ORDER — POTASSIUM CHLORIDE IN NACL 20-0.9 MEQ/L-% IV SOLN
INTRAVENOUS | Status: DC
  Administered 2018-03-17 – 2018-03-20 (×6): via INTRAVENOUS
  Filled 2018-03-17 (×6): qty 1000
  Filled 2018-03-17: qty 2000

## 2018-03-17 MED ORDER — PROMETHAZINE HCL 25 MG/ML IJ SOLN
12.5000 mg | Freq: Four times a day (QID) | INTRAMUSCULAR | Status: DC | PRN
  Administered 2018-03-17 – 2018-03-19 (×5): 12.5 mg via INTRAVENOUS
  Filled 2018-03-17 (×5): qty 1

## 2018-03-17 MED ORDER — FENTANYL CITRATE (PF) 250 MCG/5ML IJ SOLN
INTRAMUSCULAR | Status: DC | PRN
  Administered 2018-03-17 (×3): 50 ug via INTRAVENOUS

## 2018-03-17 MED ORDER — MIDAZOLAM HCL 2 MG/2ML IJ SOLN
INTRAMUSCULAR | Status: AC
  Filled 2018-03-17: qty 2

## 2018-03-17 MED ORDER — PROPOFOL 10 MG/ML IV BOLUS
INTRAVENOUS | Status: DC | PRN
  Administered 2018-03-17: 150 mg via INTRAVENOUS

## 2018-03-17 MED ORDER — PHENYLEPHRINE 40 MCG/ML (10ML) SYRINGE FOR IV PUSH (FOR BLOOD PRESSURE SUPPORT)
PREFILLED_SYRINGE | INTRAVENOUS | Status: DC | PRN
  Administered 2018-03-17: 80 ug via INTRAVENOUS
  Administered 2018-03-17: 120 ug via INTRAVENOUS
  Administered 2018-03-17: 80 ug via INTRAVENOUS
  Administered 2018-03-17: 120 ug via INTRAVENOUS

## 2018-03-17 MED ORDER — SUCCINYLCHOLINE CHLORIDE 200 MG/10ML IV SOSY
PREFILLED_SYRINGE | INTRAVENOUS | Status: AC
  Filled 2018-03-17: qty 10

## 2018-03-17 MED ORDER — LIDOCAINE 2% (20 MG/ML) 5 ML SYRINGE
INTRAMUSCULAR | Status: DC | PRN
  Administered 2018-03-17: 60 mg via INTRAVENOUS

## 2018-03-17 MED ORDER — MORPHINE SULFATE (PF) 2 MG/ML IV SOLN
1.0000 mg | INTRAVENOUS | Status: DC | PRN
  Administered 2018-03-17 – 2018-03-18 (×5): 2 mg via INTRAVENOUS
  Filled 2018-03-17 (×5): qty 1

## 2018-03-17 MED ORDER — DIPHENHYDRAMINE HCL 50 MG/ML IJ SOLN: 12.5000 mg | Freq: Four times a day (QID) | INTRAMUSCULAR | Status: DC | PRN

## 2018-03-17 MED ORDER — BUPIVACAINE-EPINEPHRINE (PF) 0.25% -1:200000 IJ SOLN
INTRAMUSCULAR | Status: AC
  Filled 2018-03-17: qty 30

## 2018-03-17 MED ORDER — GABAPENTIN 300 MG PO CAPS
300.0000 mg | ORAL_CAPSULE | Freq: Two times a day (BID) | ORAL | Status: DC
  Administered 2018-03-18 – 2018-03-20 (×6): 300 mg via ORAL
  Filled 2018-03-17 (×6): qty 1

## 2018-03-17 MED ORDER — ACETAMINOPHEN 500 MG PO TABS
1000.0000 mg | ORAL_TABLET | ORAL | Status: AC
  Administered 2018-03-17: 1000 mg via ORAL

## 2018-03-17 MED ORDER — ROCURONIUM BROMIDE 10 MG/ML (PF) SYRINGE
PREFILLED_SYRINGE | INTRAVENOUS | Status: DC | PRN
  Administered 2018-03-17: 10 mg via INTRAVENOUS
  Administered 2018-03-17 (×2): 20 mg via INTRAVENOUS
  Administered 2018-03-17: 50 mg via INTRAVENOUS

## 2018-03-17 MED ORDER — PROMETHAZINE HCL 25 MG/ML IJ SOLN
6.2500 mg | INTRAMUSCULAR | Status: DC | PRN
  Administered 2018-03-17: 6.25 mg via INTRAVENOUS

## 2018-03-17 MED ORDER — ONDANSETRON HCL 4 MG/2ML IJ SOLN
INTRAMUSCULAR | Status: AC
  Filled 2018-03-17: qty 2

## 2018-03-17 SURGICAL SUPPLY — 47 items
ADH SKN CLS APL DERMABOND .7 (GAUZE/BANDAGES/DRESSINGS) ×1
BINDER ABDOMINAL 10 UNV 27-48 (MISCELLANEOUS) ×2 IMPLANT
BIOPATCH RED 1 DISK 7.0 (GAUZE/BANDAGES/DRESSINGS) ×1 IMPLANT
BIOPATCH RED 1IN DISK 7.0MM (GAUZE/BANDAGES/DRESSINGS) ×1
BLADE CLIPPER SURG (BLADE) ×2 IMPLANT
CANISTER SUCT 3000ML PPV (MISCELLANEOUS) ×3 IMPLANT
CHLORAPREP W/TINT 26ML (MISCELLANEOUS) ×3 IMPLANT
COVER SURGICAL LIGHT HANDLE (MISCELLANEOUS) ×3 IMPLANT
DERMABOND ADVANCED (GAUZE/BANDAGES/DRESSINGS) ×2
DERMABOND ADVANCED .7 DNX12 (GAUZE/BANDAGES/DRESSINGS) IMPLANT
DRAIN CHANNEL 19F RND (DRAIN) ×2 IMPLANT
DRAPE LAPAROSCOPIC ABDOMINAL (DRAPES) ×3 IMPLANT
DRSG OPSITE POSTOP 4X10 (GAUZE/BANDAGES/DRESSINGS) ×2 IMPLANT
DRSG TEGADERM 4X4.75 (GAUZE/BANDAGES/DRESSINGS) ×2 IMPLANT
ELECT CAUTERY BLADE 6.4 (BLADE) ×3 IMPLANT
ELECT REM PT RETURN 9FT ADLT (ELECTROSURGICAL) ×3
ELECTRODE REM PT RTRN 9FT ADLT (ELECTROSURGICAL) ×1 IMPLANT
EVACUATOR SILICONE 100CC (DRAIN) ×2 IMPLANT
GLOVE BIO SURGEON STRL SZ7 (GLOVE) ×2 IMPLANT
GLOVE BIO SURGEON STRL SZ7.5 (GLOVE) ×2 IMPLANT
GLOVE BIOGEL PI IND STRL 7.0 (GLOVE) IMPLANT
GLOVE BIOGEL PI IND STRL 7.5 (GLOVE) IMPLANT
GLOVE BIOGEL PI INDICATOR 7.0 (GLOVE) ×2
GLOVE BIOGEL PI INDICATOR 7.5 (GLOVE) ×2
GLOVE SURG SIGNA 7.5 PF LTX (GLOVE) ×3 IMPLANT
GOWN STRL REUS W/ TWL LRG LVL3 (GOWN DISPOSABLE) ×1 IMPLANT
GOWN STRL REUS W/ TWL XL LVL3 (GOWN DISPOSABLE) ×1 IMPLANT
GOWN STRL REUS W/TWL LRG LVL3 (GOWN DISPOSABLE) ×6
GOWN STRL REUS W/TWL XL LVL3 (GOWN DISPOSABLE) ×3
GRAFT MATRIX GENTRIX PLS 10X15 (Tissue) ×2 IMPLANT
KIT BASIN OR (CUSTOM PROCEDURE TRAY) ×3 IMPLANT
KIT TURNOVER KIT B (KITS) ×3 IMPLANT
NDL HYPO 25GX1X1/2 BEV (NEEDLE) ×1 IMPLANT
NEEDLE HYPO 25GX1X1/2 BEV (NEEDLE) ×3 IMPLANT
NS IRRIG 1000ML POUR BTL (IV SOLUTION) ×3 IMPLANT
PACK GENERAL/GYN (CUSTOM PROCEDURE TRAY) ×3 IMPLANT
PAD ARMBOARD 7.5X6 YLW CONV (MISCELLANEOUS) ×3 IMPLANT
STAPLER VISISTAT 35W (STAPLE) ×2 IMPLANT
SUT ETHILON 2 0 FS 18 (SUTURE) ×2 IMPLANT
SUT PDS AB 1 CTX 36 (SUTURE) ×4 IMPLANT
SUT PDS AB 2-0 CT1 27 (SUTURE) ×10 IMPLANT
SUT VIC AB 2-0 SH 27 (SUTURE) ×9
SUT VIC AB 2-0 SH 27XBRD (SUTURE) IMPLANT
SUT VIC AB 3-0 SH 27 (SUTURE) ×3
SUT VIC AB 3-0 SH 27X BRD (SUTURE) IMPLANT
SYR CONTROL 10ML LL (SYRINGE) ×2 IMPLANT
TRAY FOLEY MTR SLVR 14FR STAT (SET/KITS/TRAYS/PACK) ×2 IMPLANT

## 2018-03-17 NOTE — Progress Notes (Signed)
Patient admit to from PACU to room 20. Patient alert oriented X four, on 2L [place] for comfort, c/o nausea, Zofran 4 mg given and had emesis. Patient has foley and JP drain from the surgery. VS within normal range, pain medication given to patient. Patient is relaxed in bed locked at lower position. Family at bedside. Will continue to monitor patient Repost received from [employer], [employer]

## 2018-03-17 NOTE — H&P (Signed)
Miguel [name] is an 67 y.o. male.   Chief Complaint: incisional hernia HPI: This is a 67 year old gentleman who has had multiple abdominal surgical procedures.  He now has a symptomatic incisional hernia.  He also has a gastric tube in place and still receives tube feeds.  He has had intermittent infections around the gastric tube.  He has a symptomatic incisional hernia causing abdominal wall discomfort.  He has had no obstructive symptoms.  He is otherwise without complaints.  He still uses the gastrostomy tube for nutrition  Past Medical History:  Diagnosis Date  . Anemia   . Anxiety   . BPH (benign prostatic hyperplasia)   . Gastritis   . GERD (gastroesophageal reflux disease)    "seldom" (07/16/2016)  . GI bleed due to NSAIDs 10/27/2015  . History of blood transfusion 06/2016   post OR/notes 07/15/2016  . History of hiatal hernia   . History of kidney stones   . Hyperlipidemia   . Hypertension   . 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)    "mid back"  . Sigmoid diverticulitis    with perforation    Past Surgical History:  Procedure Laterality Date  . COLON SURGERY     sigmoid  . COLOSTOMY TAKEDOWN N/A 06/28/2016   Procedure: COLOSTOMY TAKEDOWN;  Surgeon: [name], MD;  Location: [place];  Service: General;  Laterality: N/A;  . ESOPHAGOGASTRODUODENOSCOPY N/A 10/27/2015   Procedure: ESOPHAGOGASTRODUODENOSCOPY (EGD);  Surgeon: Clarene Essex, MD;  Location: [facility] ENDOSCOPY;  Service: Endoscopy;  Laterality: N/A;  . ESOPHAGOGASTRODUODENOSCOPY N/A 10/29/2015   Procedure: ESOPHAGOGASTRODUODENOSCOPY (EGD);  Surgeon: Ronald Lobo, MD;  Location: [facility] ENDOSCOPY;  Service: Endoscopy;  Laterality: N/A;  . ESOPHAGOGASTRODUODENOSCOPY N/A 10/30/2015   Procedure: ESOPHAGOGASTRODUODENOSCOPY (EGD);  Surgeon: Clarene Essex, MD;  Location: [facility] ENDOSCOPY;  Service: Endoscopy;  Laterality: N/A;  . GASTROSTOMY TUBE PLACEMENT  11/21/2015   REDUCTION OF HIATAL HERNIA , REPAIR HIATAL HERNIA, RESECTION SMALL BOWEL WITH  ANASTOMOSIS, PLACEMENT GASTROSTOMY TUBE, PLACEMENT DUODENOSTOMY TUBE (N/A)  . HEMORRHOID BANDING  X 2  . HERNIA REPAIR    . HIATAL HERNIA REPAIR N/A 11/21/2015   Procedure: REDUCTION OF HIATAL HERNIA , REPAIR HIATAL HERNIA, RESECTION SMALL BOWEL WITH ANASTOMOSIS, PLACEMENT GASTROSTOMY TUBE, PLACEMENT DUODENOSTOMY TUBE;  Surgeon: Mickeal Skinner, MD;  Location: [place];  Service: General;  Laterality: N/A;  . [place]  06/28/2016   open/notes 07/15/2016  . IR CM INJ ANY COLONIC TUBE W/FLUORO  02/04/2017  . IR GUIDED DRAIN W CATHETER PLACEMENT  07/06/2016   /NOTES 07/15/2016  . IR RADIOLOGIST EVAL & MGMT  07/27/2016  . IR RADIOLOGIST EVAL & MGMT  08/17/2016  . IR RADIOLOGIST EVAL & MGMT  08/26/2016  . IR REPLACE G-TUBE SIMPLE WO FLUORO  07/28/2017  . IR REPLACE G-TUBE SIMPLE WO FLUORO  01/31/2018  . IR [place] TUBE PERCUT W/FLUORO  08/04/2016  . IR [place] TUBE PERCUT W/FLUORO  01/14/2017  . IR US GUIDE BX ASP/DRAIN  08/04/2016  . KNEE CARTILAGE SURGERY Right 1971   "opened me up"  . LAPAROTOMY N/A 07/05/2015   Procedure: PARTIAL SIGMOID COLECTOMY AND COLOSTOMY;  Surgeon: [name], MD;  Location: [place];  Service: General;  Laterality: N/A;  . MELANOMA EXCISION  2001  . 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  10/30/2015   Procedure: REMOVAL OF DISTAL STOMACH;  Surgeon: Judeth Horn, MD;  Location: [place];  Service: General;;  . REPAIR OF PERFORATED ULCER N/A 10/30/2015   Procedure: REPAIR  OF BLEEDING  ULCER;  Surgeon: Judeth Horn, MD;  Location: [place];  Service: General;  Laterality: N/A;  . TUMOR EXCISION  2009   "back; fatty tumor"    Family History  Problem Relation Age of Onset  . Stroke Mother   . Stroke Brother   . Heart disease Brother    Social History:  reports that he has never smoked. He quit smokeless tobacco use about 20 months ago.  His smokeless tobacco use included snuff. He reports that he does not drink alcohol or use  drugs.  Allergies: No Known Allergies  No medications prior to admission.    No results found for this or any previous visit (from the past 48 hour(s)). No results found.  Review of Systems  Respiratory: Negative for shortness of breath.   Cardiovascular: Negative for chest pain.  Gastrointestinal: Positive for abdominal pain.  All other systems reviewed and are negative.   There were no vitals taken for this visit. Physical Exam  Constitutional: He appears well-developed and well-nourished. No distress.  HENT:  Head: Normocephalic and atraumatic.  Eyes: Pupils are equal, round, and reactive to light. Right eye exhibits no discharge. Left eye exhibits no discharge. No scleral icterus.  Neck: Normal range of motion. No tracheal deviation present.  Cardiovascular: Normal rate, regular rhythm and normal heart sounds.  Respiratory: Effort normal. No respiratory distress.  GI: Soft. Bowel sounds are normal. He exhibits no distension.  Gastrostomy tube in place in the left upper quadrant  Laxity of the abdominal wall with several ventral fascial defects.  Skin: He is not diaphoretic.     Assessment/Plan Incisional hernia  After long discussion, he wished to proceed with a incisional hernia repair with mesh.  He will need a biologic mesh placed more than likely given the gastrostomy tube and his high risk of infection.  We have discussed incisional hernia repair with mesh in detail.  We have discussed the risk of the procedure which includes but is not limited to bleeding, infection, injury to surrounding structures including, cardiopulmonary issues, DVT, postoperative recovery, etc. He understands and wished to proceed with surgery which will be performed today  [name], MD 03/17/2018, 7:53 AM

## 2018-03-17 NOTE — Transfer of Care (Signed)
Immediate Anesthesia Transfer of Care Note  Patient: Miguel [name]  Procedure(s) Performed: INCISIONAL HERNIA REPAIR WITH MESH (N/A Abdomen) INSERTION OF MESH (N/A Abdomen)  Patient Location: PACU  Anesthesia Type:General  Level of Consciousness: sedated  Airway & Oxygen Therapy: Patient Spontanous Breathing and Patient connected to nasal cannula oxygen  Post-op Assessment: Report given to RN and Post -op Vital signs reviewed and stable  Post vital signs: Reviewed and stable  Last Vitals:  Vitals Value Taken Time  BP    Temp    Pulse 95 03/17/2018  5:37 PM  Resp 13 03/17/2018  5:37 PM  SpO2 99 % 03/17/2018  5:37 PM  Vitals shown include unvalidated device data.  Last Pain:  Vitals:   03/17/18 1243  TempSrc: Oral  PainSc:          Complications: No apparent anesthesia complications

## 2018-03-17 NOTE — Op Note (Addendum)
INCISIONAL HERNIA REPAIR WITH MESH, INSERTION OF MESH  Procedure Note  BURR SOFFER 03/17/2018   Pre-op Diagnosis: INCISIONAL HERNIA     Post-op Diagnosis: same  Procedure(s): INCISIONAL HERNIA REPAIR WITH MESH INSERTION OF MESH (10 cm x 15 cm Gentrix Surgical Matrix Plus)  Surgeon(s): [name], MD  Anesthesia: General  Staff:  Circulator: Rozell Searing, RN Relief Scrub: Marijean Heath Scrub Person: Rolan Bucco Vendor Representative : August Luz Circulator Assistant: Quincy Carnes, RN RN First Assistant: Quincy Carnes, RN  Estimated Blood Loss: Minimal   Indications: This is a 67 year old gentleman who has had multiple surgical procedures.  He has an incisional hernia.  He also has a gastric tube.  The decision was made to perform an open hernia repair with mesh using a xenograft or Gentrix mesh secondary to the risk of infection of Prolene mesh secondary to the G-tube.             Procedure: The patient was brought to the operating room and identified as correct patient.  He was placed supine on the operating room table and general anesthesia was induced.  His abdomen was then prepped and draped in usual sterile fashion.  A created midline incision through previous scar with a scalpel.  I carried this down to the hernia sac with the electrocautery.  I then entered the sac.  There was no bowel involved in the hernia.  The patient had multiple small fascial defects through the entire midline incision.  Several wispy adhesions were taken down with the Metzenbaum scissors.  I was able to free up the peritoneum circumferentially around the fascial opening.  I then opened up the peritoneum and separated from the overlying rectus muscle and rectus sheath.  This was done with the cautery circumferentially.  At this point I was then able to close the peritoneum with a running 2-0 Vicryl suture.  We then measured the fascial defect.  A 10 cm x 15 cm piece  of Gemtrix surgical matrix plus mesh was brought to the field and soaked in saline appropriately with time.  I then placed in the retrorectus location.  I undermined the skin circumferentially with electrocautery to expose the fascia.  I then sutured it going through the fascia and into the mesh with interrupted 2-0 PDS sutures circumferentially.  We irrigated this layer with saline.  I then closed the fascia over the top of the mesh with a running #1 single armed PDS suture.  Hemostasis appeared to be achieved.  I injected Exparel circumferentially around the fascia and skin.  I made a separate skin incision and placed a 19 [place] Blake drain over the top of the fascia.  This was sutured in place with nylon suture.  I then approximated the subcutaneous tissue with interrupted 3-0 Vicryl sutures and closed skin with skin staples.  The drain was placed to bulb suction.  A binder was then applied after dressing.  The patient tolerated procedure well.  All the counts were correct at the end of the procedure.  The patient was then extubated in the operating room and taken in a stable condition to the recovery room.          [name]   Date: 03/17/2018  Time: 5:21 PM

## 2018-03-17 NOTE — Anesthesia Procedure Notes (Signed)
Procedure Name: Intubation Date/Time: 03/17/2018 3:37 PM Performed by: Marsa Aris, CRNA Pre-anesthesia Checklist: Patient identified, Emergency Drugs available, Suction available and Patient being monitored Patient Re-evaluated:Patient Re-evaluated prior to induction Oxygen Delivery Method: Circle System Utilized Preoxygenation: Pre-oxygenation with 100% oxygen Induction Type: IV induction Ventilation: Mask ventilation without difficulty Laryngoscope Size: Miller and 2 Grade View: Grade I Tube type: Oral Tube size: 7.5 mm Number of attempts: 1 Airway Equipment and Method: Stylet and Oral airway Placement Confirmation: ETT inserted through vocal cords under direct vision,  positive ETCO2 and breath sounds checked- equal and bilateral Secured at: 22 cm Tube secured with: Tape Dental Injury: Teeth and Oropharynx as per pre-operative assessment  Comments: No change in dentition from pre-procedure

## 2018-03-18 LAB — BASIC METABOLIC PANEL
Anion gap: 11 (ref 5–15)
BUN: 14 mg/dL (ref 8–23)
CO2: 26 mmol/L (ref 22–32)
Calcium: 8.4 mg/dL — ABNORMAL LOW (ref 8.9–10.3)
Chloride: 103 mmol/L (ref 98–111)
Creatinine, Ser: 1.14 mg/dL (ref 0.61–1.24)
GFR calc Af Amer: >60 mL/min (ref >60)
GFR calc non Af Amer: >60 mL/min (ref >60)
Glucose, Bld: 161 mg/dL — ABNORMAL HIGH (ref 70–99)
Potassium: 4.8 mmol/L (ref 3.5–5.1)
Sodium: 140 mmol/L (ref 135–145)

## 2018-03-18 LAB — CBC
HCT: 32.8 % — ABNORMAL LOW (ref 39.0–52.0)
Hemoglobin: 11 g/dL — ABNORMAL LOW (ref 13.0–17.0)
MCH: 31.5 pg (ref 26.0–34.0)
MCHC: 33.5 g/dL (ref 30.0–36.0)
MCV: 94 fL (ref 80.0–100.0)
Platelets: 305 10*3/uL (ref 150–400)
RBC: 3.49 MIL/uL — AB (ref 4.22–5.81)
RDW: 12.9 % (ref 11.5–15.5)
WBC: 23.5 10*3/uL — ABNORMAL HIGH (ref 4.0–10.5)
nRBC: 0 % (ref 0.0–0.2)

## 2018-03-18 MED ORDER — HYDROMORPHONE 1 MG/ML IV SOLN
INTRAVENOUS | Status: DC
  Administered 2018-03-18: 0.6 mg via INTRAVENOUS
  Administered 2018-03-18: 0.3 mg via INTRAVENOUS
  Administered 2018-03-18: 25 mg via INTRAVENOUS
  Administered 2018-03-18: 1.1 mg via INTRAVENOUS
  Administered 2018-03-18: 0.6 mg via INTRAVENOUS
  Administered 2018-03-19: 1.8 mg via INTRAVENOUS
  Administered 2018-03-19: 0.3 mg via INTRAVENOUS
  Administered 2018-03-19: 0.3 mL via INTRAVENOUS
  Administered 2018-03-19: 1.5 mg via INTRAVENOUS
  Administered 2018-03-19: 0.6 mg via INTRAVENOUS
  Administered 2018-03-20: 1.8 mg via INTRAVENOUS
  Administered 2018-03-20 (×2): 0.3 mg via INTRAVENOUS
  Administered 2018-03-20: 1.5 mg via INTRAVENOUS
  Administered 2018-03-20: 0.3 mg via INTRAVENOUS
  Administered 2018-03-20: 1.2 mg via INTRAVENOUS
  Administered 2018-03-20: 2.1 mg via INTRAVENOUS
  Administered 2018-03-21: 1.5 mg via INTRAVENOUS
  Filled 2018-03-18: qty 25

## 2018-03-18 MED ORDER — DIPHENHYDRAMINE HCL 50 MG/ML IJ SOLN: 12.5000 mg | Freq: Four times a day (QID) | INTRAMUSCULAR | Status: DC | PRN

## 2018-03-18 MED ORDER — SODIUM CHLORIDE 0.9% FLUSH: 9.0000 mL | INTRAVENOUS | Status: DC | PRN

## 2018-03-18 MED ORDER — ORAL CARE MOUTH RINSE
15.0000 mL | Freq: Two times a day (BID) | OROMUCOSAL | Status: DC
  Administered 2018-03-18 – 2018-03-20 (×3): 15 mL via OROMUCOSAL

## 2018-03-18 MED ORDER — DIPHENHYDRAMINE HCL 12.5 MG/5ML PO ELIX: 12.5000 mg | ORAL_SOLUTION | Freq: Four times a day (QID) | ORAL | Status: DC | PRN

## 2018-03-18 MED ORDER — NALOXONE HCL 0.4 MG/ML IJ SOLN: 0.4000 mg | INTRAMUSCULAR | Status: DC | PRN

## 2018-03-18 MED ORDER — ONDANSETRON HCL 4 MG/2ML IJ SOLN
4.0000 mg | Freq: Four times a day (QID) | INTRAMUSCULAR | Status: DC | PRN
  Administered 2018-03-18 – 2018-03-21 (×5): 4 mg via INTRAVENOUS

## 2018-03-18 NOTE — Progress Notes (Signed)
1 Day Post-Op   Subjective/Chief Complaint: Fairly sore as expected Had nausea with emesis also   Objective: Vital signs in last 24 hours: Temp:  [97.5 F (36.4 C)-99.2 F (37.3 C)] 99 F (37.2 C) (12/21 0552) Pulse Rate:  [86-115] 115 (12/21 0552) Resp:  [13-20] 16 (12/21 0552) BP: (119-153)/(77-92) 150/88 (12/21 0552) SpO2:  [94 %-100 %] 96 % (12/21 0552) Weight:  [62.1 kg] 62.1 kg (12/20 1241)    Intake/Output from previous day: 12/20 0701 - 12/21 0700 In: 1850 [I.V.:1600; IV Piggyback:250] Out: 1560 [Urine:1425; Drains:65; Blood:70] Intake/Output this shift: No intake/output data recorded.  Exam: Awake and alert Comfortable when still Abdomen soft, drain serosang  Lab Results:  Recent Labs    03/18/18 0425  WBC 23.5*  HGB 11.0*  HCT 32.8*  PLT 305   BMET Recent Labs    03/18/18 0425  NA 140  K 4.8  CL 103  CO2 26  GLUCOSE 161*  BUN 14  CREATININE 1.14  CALCIUM 8.4*   PT/INR No results for input(s): LABPROT, INR in the last 72 hours. ABG No results for input(s): PHART, HCO3 in the last 72 hours.  Invalid input(s): PCO2, PO2  Studies/Results: No results found.  Anti-infectives: Anti-infectives (From admission, onward)   Start     Dose/Rate Route Frequency Ordered Stop   03/18/18 0600  ceFAZolin (ANCEF) IVPB 2g/100 mL premix     2 g 200 mL/hr over 30 Minutes Intravenous On call to O.R. 03/17/18 1223 03/17/18 1551   03/17/18 1229  ceFAZolin (ANCEF) 2-4 GM/100ML-% IVPB    Note to Pharmacy:  [name], [name]: cabinet override      03/17/18 1229 03/17/18 1546      Assessment/Plan: s/p Procedure(s): INCISIONAL HERNIA REPAIR WITH MESH (N/A) INSERTION OF MESH (N/A)  Will place G-tube to gravity bag Start his home tube feeds tonight if better PCA for pain  LOS: 1 day    Miguel [name] 03/18/2018

## 2018-03-18 NOTE — Progress Notes (Signed)
Pt. With increase nausea, and some vomiting; too early for Zofran- Dr. Ninfa Linden paged and in surgery(RTC); order given.

## 2018-03-19 LAB — CBC
HCT: 27 % — ABNORMAL LOW (ref 39.0–52.0)
HEMOGLOBIN: 8.5 g/dL — AB (ref 13.0–17.0)
MCH: 30.1 pg (ref 26.0–34.0)
MCHC: 31.5 g/dL (ref 30.0–36.0)
MCV: 95.7 fL (ref 80.0–100.0)
Platelets: 212 10*3/uL (ref 150–400)
RBC: 2.82 MIL/uL — ABNORMAL LOW (ref 4.22–5.81)
RDW: 13.2 % (ref 11.5–15.5)
WBC: 20 10*3/uL — ABNORMAL HIGH (ref 4.0–10.5)
nRBC: 0 % (ref 0.0–0.2)

## 2018-03-19 MED ORDER — SODIUM CHLORIDE 0.9 % IV BOLUS
1000.0000 mL | Freq: Once | INTRAVENOUS | Status: AC
  Administered 2018-03-19: 1000 mL via INTRAVENOUS

## 2018-03-19 NOTE — Plan of Care (Signed)
  Problem: Coping: Goal: Level of anxiety will decrease Outcome: Progressing   Problem: Elimination: Goal: Will not experience complications related to urinary retention Outcome: Progressing   

## 2018-03-19 NOTE — Progress Notes (Signed)
2 Days Post-Op   Subjective/Chief Complaint: Still pretty sore today Nausea less Tolerated night time tube feeds Passed some flatus   Objective: Vital signs in last 24 hours: Temp:  [98.7 F (37.1 C)-99.6 F (37.6 C)] 99.4 F (37.4 C) (12/22 0529) Pulse Rate:  [110-116] 113 (12/22 0529) Resp:  [7-23] 7 (12/22 0812) BP: (132-143)/(66-89) 132/66 (12/22 0529) SpO2:  [96 %-99 %] 96 % (12/22 0812) Last BM Date: 03/16/18  Intake/Output from previous day: 12/21 0701 - 12/22 0700 In: 4373.7 [P.O.:720; I.V.:2750.3; NG/GT:903.3] Out: 865 [Urine:750; Drains:115] Intake/Output this shift: No intake/output data recorded.  Exam: Looks more comfortable Abdomen still tender, drain serosang, non-distended  Lab Results:  Recent Labs    03/18/18 0425 03/19/18 0255  WBC 23.5* 20.0*  HGB 11.0* 8.5*  HCT 32.8* 27.0*  PLT 305 212   BMET Recent Labs    03/18/18 0425  NA 140  K 4.8  CL 103  CO2 26  GLUCOSE 161*  BUN 14  CREATININE 1.14  CALCIUM 8.4*   PT/INR No results for input(s): LABPROT, INR in the last 72 hours. ABG No results for input(s): PHART, HCO3 in the last 72 hours.  Invalid input(s): PCO2, PO2  Studies/Results: No results found.  Anti-infectives: Anti-infectives (From admission, onward)   Start     Dose/Rate Route Frequency Ordered Stop   03/18/18 0600  ceFAZolin (ANCEF) IVPB 2g/100 mL premix     2 g 200 mL/hr over 30 Minutes Intravenous On call to O.R. 03/17/18 1223 03/17/18 1551   03/17/18 1229  ceFAZolin (ANCEF) 2-4 GM/100ML-% IVPB    Note to Pharmacy:  [name], [name]: cabinet override      03/17/18 1229 03/17/18 1546      Assessment/Plan: s/p Procedure(s): INCISIONAL HERNIA REPAIR WITH MESH (N/A) INSERTION OF MESH (N/A)  Again, pain not surprising given repair Will limit po intake, continue night feeds Continue PCA ambulate  LOS: 2 days    [name] 03/19/2018

## 2018-03-20 ENCOUNTER — Encounter (HOSPITAL_COMMUNITY): Payer: Self-pay | Admitting: General Practice

## 2018-03-20 ENCOUNTER — Other Ambulatory Visit: Payer: Self-pay

## 2018-03-20 LAB — CBC
HCT: 21.8 % — ABNORMAL LOW (ref 39.0–52.0)
HEMOGLOBIN: 7.2 g/dL — AB (ref 13.0–17.0)
MCH: 31.7 pg (ref 26.0–34.0)
MCHC: 33 g/dL (ref 30.0–36.0)
MCV: 96 fL (ref 80.0–100.0)
PLATELETS: 168 10*3/uL (ref 150–400)
RBC: 2.27 MIL/uL — AB (ref 4.22–5.81)
RDW: 13.4 % (ref 11.5–15.5)
WBC: 9.4 10*3/uL (ref 4.0–10.5)
nRBC: 0 % (ref 0.0–0.2)

## 2018-03-20 LAB — BASIC METABOLIC PANEL
Anion gap: 12 (ref 5–15)
BUN: 13 mg/dL (ref 8–23)
CO2: 26 mmol/L (ref 22–32)
Calcium: 7.5 mg/dL — ABNORMAL LOW (ref 8.9–10.3)
Chloride: 105 mmol/L (ref 98–111)
Creatinine, Ser: 0.66 mg/dL (ref 0.61–1.24)
GFR calc Af Amer: >60 mL/min (ref >60)
GFR calc non Af Amer: >60 mL/min (ref >60)
Glucose, Bld: 120 mg/dL — ABNORMAL HIGH (ref 70–99)
Potassium: 3.7 mmol/L (ref 3.5–5.1)
Sodium: 143 mmol/L (ref 135–145)

## 2018-03-20 NOTE — Progress Notes (Signed)
Initial Nutrition Assessment  DOCUMENTATION CODES:   Not applicable  INTERVENTION:   -RD will follow for diet advancement and supplement as appropriate -Continue nocturnal feedings of Osmolite 1.5 @ 83 ml/hr via PEG over 12 hour period  Tube feeding regimen provides 1494 kcal (81% of needs), 62 grams of protein, and 759 ml of H2O.   NUTRITION DIAGNOSIS:   Increased nutrient needs related to post-op healing, altered GI function as evidenced by estimated needs(PEG dependence).  GOAL:   Patient will meet greater than or equal to 90% of their needs  MONITOR:   PO intake, Labs, Diet advancement, Weight trends, TF tolerance, Skin, I & O's  REASON FOR ASSESSMENT:   New TF    ASSESSMENT:   This is a 67 year old gentleman who has had multiple surgical procedures.  He has an incisional hernia.  He also has a gastric tube.  The decision was made to perform an open hernia repair with mesh using a xenograft or Gentrix mesh secondary to the risk of infection of Prolene mesh secondary to the G-tube.  12/20- s/p Procedure(s): INCISIONAL HERNIA REPAIR WITH MESH; INSERTION OF MESH (10 cm x 15 cm Gentrix Surgical Matrix Plus)  Pt familiar to this RD from multiple prior admissions. Pt appears physically a lot better from RD's recollection for prior admissions.   Spoke with pt and wife at bedside. Both confirm that pt continues to do well on home nocturnal TF regimen. Per pt wife, PO intake has increased some from last year and pt is able to eat a wider variety of foods, such as soups, sandwiches, and hamburgers for comfort, but this does not contribute significantly to nutritional intake.   Pt denies any recent wt loss. Per wt hx, wt has been stable over the past year. Pt repots no changes in mobility up until the past 3 weeks when he had to "slow down" due to hernia.   Pt reports good tolerance of clear liquid diet- consumed some coffee, jello, and gingerale earlier today. He is hopeful for  diet advancement soon to improve oral intake.   Reviewed home TF regimen with pt and wife; pt receiving Osmolite 1.5 @ 83 ml/hr over 12 hour period via PEG. Complete regimen provides 1494 kcals, 62 grams protein, and 759 ml free water daily, meeting 81% of estimated kcal needs and 69% of estimated protein needs.   Pt and wife with no further nutritional concerns at this time, however, expressed appreciation for visit.   Labs reviewed.   NUTRITION - FOCUSED PHYSICAL EXAM:    Most Recent Value  Orbital Region  No depletion  Upper Arm Region  Mild depletion  Thoracic and Lumbar Region  No depletion  Buccal Region  No depletion  Temple Region  No depletion  Clavicle Bone Region  No depletion  Clavicle and Acromion Bone Region  No depletion  Scapular Bone Region  No depletion  Dorsal Hand  Mild depletion  Patellar Region  Mild depletion  Anterior Thigh Region  Mild depletion  Posterior Calf Region  Mild depletion  Edema (RD Assessment)  None  Hair  Reviewed  Eyes  Reviewed  Mouth  Reviewed  Skin  Reviewed  Nails  Reviewed       Diet Order:   Diet Order            Diet clear liquid Room service appropriate? Yes; Fluid consistency: Thin  Diet effective now              EDUCATION  NEEDS:   No education needs have been identified at this time  Skin:  Skin Assessment: Skin Integrity Issues: Skin Integrity Issues:: Incisions Incisions: closed abdomen  Last BM:  03/16/18  Height:   Ht Readings from Last 1 Encounters:  03/17/18 5\' 4"  (1.626 m)    Weight:   Wt Readings from Last 1 Encounters:  03/17/18 62.1 kg    Ideal Body Weight:  59.1 kg  BMI:  Body mass index is 23.48 kg/m.  Estimated Nutritional Needs:   Kcal:  1850-2050  Protein:  90-105 grams  Fluid:  > 1.8 L    Juwan Vences A. Jimmye Norman, RD, LDN, CDE Pager: [phone] After hours Pager: [phone]

## 2018-03-20 NOTE — Anesthesia Postprocedure Evaluation (Signed)
Anesthesia Post Note  Patient: Miguel [name]  Procedure(s) Performed: INCISIONAL HERNIA REPAIR WITH MESH (N/A Abdomen) INSERTION OF MESH (N/A Abdomen)     Patient location during evaluation: PACU Anesthesia Type: General Level of consciousness: awake and alert Pain management: pain level controlled Vital Signs Assessment: post-procedure vital signs reviewed and stable Respiratory status: spontaneous breathing, nonlabored ventilation, respiratory function stable and patient connected to nasal cannula oxygen Cardiovascular status: blood pressure returned to baseline and stable Postop Assessment: no apparent nausea or vomiting Anesthetic complications: no    Last Vitals:  Vitals:   03/20/18 0816 03/20/18 1005  BP:  114/69  Pulse:  94  Resp: 20 16  Temp:  37.4 C  SpO2: 96% 95%    Last Pain:  Vitals:   03/20/18 1005  TempSrc: Oral  PainSc:                  [name]

## 2018-03-20 NOTE — Progress Notes (Signed)
3 Days Post-Op   Subjective/Chief Complaint: Feeling somewhat better Less pain and nausea   Objective: Vital signs in last 24 hours: Temp:  [98.8 F (37.1 C)-100.2 F (37.9 C)] 98.8 F (37.1 C) (12/23 0548) Pulse Rate:  [87-114] 87 (12/23 0548) Resp:  [7-20] 18 (12/23 0548) BP: (108-133)/(62-80) 108/65 (12/23 0548) SpO2:  [96 %-100 %] 99 % (12/23 0548) Last BM Date: 03/16/18  Intake/Output from previous day: 12/22 0701 - 12/23 0700 In: 2353.5 [P.O.:237; I.V.:2019.8; NG/GT:96.7] Out: 1585 [Urine:1485; Drains:100] Intake/Output this shift: Total I/O In: 1332.5 [I.V.:1332.5] Out: 660 [Urine:600; Drains:60]  Exam: Looks more comfortable Abdomen soft, drain serosang  Lab Results:  Recent Labs    03/19/18 0255 03/20/18 0151  WBC 20.0* 9.4  HGB 8.5* 7.2*  HCT 27.0* 21.8*  PLT 212 168   BMET Recent Labs    03/18/18 0425 03/20/18 0151  NA 140 143  K 4.8 3.7  CL 103 105  CO2 26 26  GLUCOSE 161* 120*  BUN 14 13  CREATININE 1.14 0.66  CALCIUM 8.4* 7.5*   PT/INR No results for input(s): LABPROT, INR in the last 72 hours. ABG No results for input(s): PHART, HCO3 in the last 72 hours.  Invalid input(s): PCO2, PO2  Studies/Results: No results found.  Anti-infectives: Anti-infectives (From admission, onward)   Start     Dose/Rate Route Frequency Ordered Stop   03/18/18 0600  ceFAZolin (ANCEF) IVPB 2g/100 mL premix     2 g 200 mL/hr over 30 Minutes Intravenous On call to O.R. 03/17/18 1223 03/17/18 1551   03/17/18 1229  ceFAZolin (ANCEF) 2-4 GM/100ML-% IVPB    Note to Pharmacy:  [name], [name]: cabinet override      03/17/18 1229 03/17/18 1546      Assessment/Plan: s/p Procedure(s): INCISIONAL HERNIA REPAIR WITH MESH (N/A) INSERTION OF MESH (N/A)  Ambulate Continue PCA Decrease IVF   LOS: 3 days    [name] 03/20/2018

## 2018-03-21 ENCOUNTER — Encounter (HOSPITAL_COMMUNITY): Payer: Self-pay | Admitting: Surgery

## 2018-03-21 MED ORDER — OXYCODONE HCL 5 MG PO TABS: 5.0000 mg | ORAL_TABLET | Freq: Four times a day (QID) | ORAL | 0 refills | Status: DC | PRN

## 2018-03-21 NOTE — Discharge Instructions (Signed)
[facility]  UMBILICAL OR INGUINAL HERNIA REPAIR: POST OP INSTRUCTIONS  Always review your discharge instruction sheet given to you by the facility where your surgery was performed. IF YOU HAVE DISABILITY OR FAMILY LEAVE FORMS, YOU MUST BRING THEM TO THE OFFICE FOR PROCESSING.   DO NOT GIVE THEM TO YOUR DOCTOR.  1. A  prescription for pain medication may be given to you upon discharge.  Take your pain medication as prescribed, if needed.  If narcotic pain medicine is not needed, then you may take acetaminophen (Tylenol) or ibuprofen (Advil) as needed. 2. Take your usually prescribed medications unless otherwise directed. If you need a refill on your pain medication, please contact your pharmacy.  They will contact our office to request authorization. Prescriptions will not be filled after 5 pm or on week-ends. 3. You should follow a light diet the first 24 hours after arrival home, such as soup and crackers, etc.  Be sure to include lots of fluids daily.  Resume your normal diet the day after surgery. 4.Most patients will experience some swelling and bruising around the umbilicus or in the groin and scrotum.  Ice packs and reclining will help.  Swelling and bruising can take several days to resolve.  6. It is common to experience some constipation if taking pain medication after surgery.  Increasing fluid intake and taking a stool softener (such as Colace) will usually help or prevent this problem from occurring.  A mild laxative (Milk of Magnesia or Miralax) should be taken according to package directions if there are no bowel movements after 48 hours. 7. Unless discharge instructions indicate otherwise, you may remove your bandages 24-48 hours after surgery, and you may shower at that time.  You may have steri-strips (small skin tapes) in place directly over the incision.  These strips should be left on the skin for 7-10 days.  If your surgeon used skin glue on the  incision, you may shower in 24 hours.  The glue will flake off over the next 2-3 weeks.  Any sutures or staples will be removed at the office during your follow-up visit. 8. ACTIVITIES:  You may resume regular (light) daily activities beginning the next day--such as daily self-care, walking, climbing stairs--gradually increasing activities as tolerated.  You may have sexual intercourse when it is comfortable.  Refrain from any heavy lifting or straining until approved by your doctor.  a.You may drive when you are no longer taking prescription pain medication, you can comfortably wear a seatbelt, and you can safely maneuver your car and apply brakes. b.RETURN TO WORK:   _____________________________________________  9.You should see your doctor in the office for a follow-up appointment approximately 2-3 weeks after your surgery.  Make sure that you call for this appointment within a day or two after you arrive home to insure a convenient appointment time. 10.OTHER INSTRUCTIONS: NO LIFTING MORE THAN 15 POUNDS FOR 6 WEEKS OK TO REMOVE BANDAGE AND SHOWER TYLENOL ALSO FOR PAIN_________________________    _____________________________________  WHEN TO CALL YOUR DOCTOR: 1. Fever over 101.0 2. Inability to urinate 3. Nausea and/or vomiting 4. Extreme swelling or bruising 5. Continued bleeding from incision. 6. Increased pain, redness, or drainage from the incision  The clinic staff is available to answer your questions during regular business hours.  Please dont hesitate to call and ask to speak to one of the nurses for clinical concerns.  If you have a medical emergency, go to the nearest emergency room or  call 911.  A surgeon from [facility] is always on call at the hospital   [address], [place], [place], [place]  [address] ?  [address], [place], [place]   [address] [phone] ? [phone] ? FAX [phone] Web site: [email or web address]

## 2018-03-21 NOTE — Progress Notes (Signed)
Patient ID: Miguel [name], male   DOB: 1951/03/29, 67 y.o.   MRN: [id number]   Improving Wants to go home Tolerating tube feeds Abdomen soft  Plan: Discharge home

## 2018-03-21 NOTE — Discharge Summary (Signed)
Physician Discharge Summary  Patient ID: Miguel [name] MRN: [id number] DOB/AGE: 09/21/1950 67 y.o.  Admit date: 03/17/2018 Discharge date: 03/21/2018  Admission Diagnoses:  Discharge Diagnoses:  Active Problems:   Incisional hernia   Discharged Condition: good  Hospital Course: uneventful postop recovery s/p hernia repair  Consults: None  Significant Diagnostic Studies:   Treatments: surgery: incisional hernia repair with mesh  Discharge Exam: Blood pressure 118/68, pulse 92, temperature 99 F (37.2 C), temperature source Oral, resp. rate 19, height 5\' 4"  (1.626 m), weight 62.1 kg, SpO2 96 %. General appearance: alert, cooperative and no distress Resp: clear to auscultation bilaterally Cardio: regular rate and rhythm, S1, S2 normal, no murmur, click, rub or gallop Incision/Wound:abdomen soft, incision clean, drain serosang  Disposition: Discharge disposition: 01-Home or Self Care        Allergies as of 03/21/2018   No Known Allergies     Medication List    TAKE these medications   acetaminophen 500 MG tablet Commonly known as:  TYLENOL Take 1 tablet (500 mg total) by mouth every 8 (eight) hours as needed for mild pain (for pain). What changed:  reasons to take this   ALIGN 4 MG Caps Take 4 mg by mouth daily.   aspirin EC 81 MG tablet Take 81 mg by mouth daily.   diclofenac sodium 1 % Gel Commonly known as:  VOLTAREN Apply 1 application topically 2 (two) times daily as needed (for back and shoulder pain).   feeding supplement (OSMOLITE 1.5 CAL) Liqd Place 1,000 mLs into feeding tube continuous.   Fish Oil 1200 MG Caps Take 1,200 mg by mouth daily.   multivitamin with minerals Tabs tablet Take 1 tablet by mouth daily.   ondansetron 8 MG tablet Commonly known as:  ZOFRAN Take 4-8 mg by mouth every 6 (six) hours as needed for nausea or vomiting.   oxyCODONE 5 MG immediate release tablet Commonly known as:  Oxy IR/ROXICODONE Take 1 tablet (5  mg total) by mouth every 6 (six) hours as needed for severe pain. What changed:  when to take this   pantoprazole 40 MG tablet Commonly known as:  PROTONIX Take 40 mg by mouth 2 (two) times daily.   sodium chloride flush 0.9 % Soln Commonly known as:  NS 10 mLs by Other route daily.   tamsulosin 0.4 MG Caps capsule Commonly known as:  FLOMAX Take 1 capsule (0.4 mg total) by mouth daily.   tiZANidine 2 MG tablet Commonly known as:  ZANAFLEX Take 2 mg by mouth 3 (three) times daily as needed for muscle spasms.   traMADol 50 MG tablet Commonly known as:  ULTRAM Take 50 mg by mouth 3 (three) times daily as needed for moderate pain.   VITAMIN B-12 PO Take 1 tablet by mouth daily.   zolpidem 10 MG tablet Commonly known as:  AMBIEN Take 5-10 mg by mouth at bedtime as needed for sleep.      Follow-up Information    [name], MD. Schedule an appointment as soon as possible for a visit on 03/27/2018.   Specialty:  General Surgery Why:  nureses only visit to remove drain/staples. call the office for time Contact information: [address]  [place] [address] [phone]           Signed: [name] 03/21/2018, 7:45 AM

## 2018-03-21 NOTE — Progress Notes (Signed)
Miguel [name] to be D/C'd  per MD order. Discussed with the patient and all questions fully answered.  VSS, Skin clean, dry and intact without evidence of skin break down, no evidence of skin tears noted.  IV catheter discontinued intact. Site without signs and symptoms of complications. Dressing and pressure applied.  An After Visit Summary was printed and given to the patient. Patient received prescription.  D/c education completed with patient/family including follow up instructions, medication list, d/c activities limitations if indicated, with other d/c instructions as indicated by MD - patient able to verbalize understanding, all questions fully answered.   Patient instructed to return to ED, call 911, or call MD for any changes in condition.   Patient to be escorted via [place], and D/C home via private auto.

## 2018-03-21 NOTE — Progress Notes (Signed)
Pt ready for DC home with family.  JP drain education done and DC instructions given.  Wife handles TF at home and states they have no more needs.

## 2018-04-04 ENCOUNTER — Other Ambulatory Visit (HOSPITAL_COMMUNITY): Payer: Self-pay | Admitting: Radiology

## 2018-04-04 DIAGNOSIS — R633 Feeding difficulties, unspecified: Secondary | ICD-10-CM

## 2018-04-06 ENCOUNTER — Other Ambulatory Visit (HOSPITAL_COMMUNITY): Payer: Medicare Other

## 2018-04-17 ENCOUNTER — Encounter (HOSPITAL_COMMUNITY): Payer: Self-pay | Admitting: Surgery

## 2018-04-19 DIAGNOSIS — R110 Nausea: Secondary | ICD-10-CM | POA: Diagnosis not present

## 2018-04-19 DIAGNOSIS — R633 Feeding difficulties: Secondary | ICD-10-CM | POA: Diagnosis not present

## 2018-04-19 DIAGNOSIS — K219 Gastro-esophageal reflux disease without esophagitis: Secondary | ICD-10-CM | POA: Diagnosis not present

## 2018-06-02 ENCOUNTER — Other Ambulatory Visit: Payer: Self-pay | Admitting: Surgery

## 2018-06-05 DIAGNOSIS — K5901 Slow transit constipation: Secondary | ICD-10-CM | POA: Diagnosis not present

## 2018-06-05 DIAGNOSIS — K219 Gastro-esophageal reflux disease without esophagitis: Secondary | ICD-10-CM | POA: Diagnosis not present

## 2018-06-05 DIAGNOSIS — R110 Nausea: Secondary | ICD-10-CM | POA: Diagnosis not present

## 2018-06-05 DIAGNOSIS — R633 Feeding difficulties: Secondary | ICD-10-CM | POA: Diagnosis not present

## 2018-06-07 ENCOUNTER — Other Ambulatory Visit: Payer: Self-pay | Admitting: Surgery

## 2018-06-07 DIAGNOSIS — R972 Elevated prostate specific antigen [PSA]: Secondary | ICD-10-CM | POA: Diagnosis not present

## 2018-06-07 DIAGNOSIS — R109 Unspecified abdominal pain: Secondary | ICD-10-CM

## 2018-06-15 ENCOUNTER — Ambulatory Visit
Admission: RE | Admit: 2018-06-15 | Discharge: 2018-06-15 | Disposition: A | Discharge status: Home / Self Care | Payer: Medicare Other | Source: Ambulatory Visit | Attending: Surgery | Admitting: Surgery

## 2018-06-15 DIAGNOSIS — R109 Unspecified abdominal pain: Secondary | ICD-10-CM

## 2018-06-22 DIAGNOSIS — R109 Unspecified abdominal pain: Secondary | ICD-10-CM | POA: Diagnosis not present

## 2018-07-03 DIAGNOSIS — N5082 Scrotal pain: Secondary | ICD-10-CM | POA: Diagnosis not present

## 2018-07-03 DIAGNOSIS — R1084 Generalized abdominal pain: Secondary | ICD-10-CM | POA: Diagnosis not present

## 2018-07-03 DIAGNOSIS — R1032 Left lower quadrant pain: Secondary | ICD-10-CM | POA: Diagnosis not present

## 2018-07-03 DIAGNOSIS — N200 Calculus of kidney: Secondary | ICD-10-CM | POA: Diagnosis not present

## 2018-08-07 ENCOUNTER — Other Ambulatory Visit: Payer: Self-pay | Admitting: Surgery

## 2018-08-07 DIAGNOSIS — K4020 Bilateral inguinal hernia, without obstruction or gangrene, not specified as recurrent: Secondary | ICD-10-CM | POA: Diagnosis not present

## 2018-08-08 DIAGNOSIS — R972 Elevated prostate specific antigen [PSA]: Secondary | ICD-10-CM | POA: Diagnosis not present

## 2018-08-15 DIAGNOSIS — N400 Enlarged prostate without lower urinary tract symptoms: Secondary | ICD-10-CM | POA: Diagnosis not present

## 2018-08-15 DIAGNOSIS — R972 Elevated prostate specific antigen [PSA]: Secondary | ICD-10-CM | POA: Diagnosis not present

## 2018-08-15 DIAGNOSIS — N200 Calculus of kidney: Secondary | ICD-10-CM | POA: Diagnosis not present

## 2018-09-01 DIAGNOSIS — D485 Neoplasm of uncertain behavior of skin: Secondary | ICD-10-CM | POA: Diagnosis not present

## 2018-09-01 DIAGNOSIS — Z85820 Personal history of malignant melanoma of skin: Secondary | ICD-10-CM | POA: Diagnosis not present

## 2018-09-01 DIAGNOSIS — B0089 Other herpesviral infection: Secondary | ICD-10-CM | POA: Diagnosis not present

## 2018-09-01 DIAGNOSIS — D225 Melanocytic nevi of trunk: Secondary | ICD-10-CM | POA: Diagnosis not present

## 2018-09-01 DIAGNOSIS — Z1283 Encounter for screening for malignant neoplasm of skin: Secondary | ICD-10-CM | POA: Diagnosis not present

## 2018-09-01 DIAGNOSIS — Z08 Encounter for follow-up examination after completed treatment for malignant neoplasm: Secondary | ICD-10-CM | POA: Diagnosis not present

## 2018-09-20 ENCOUNTER — Encounter: Payer: Self-pay | Admitting: Orthopaedic Surgery

## 2018-09-20 ENCOUNTER — Ambulatory Visit (INDEPENDENT_AMBULATORY_CARE_PROVIDER_SITE_OTHER): Payer: Medicare Other | Admitting: Orthopaedic Surgery

## 2018-09-20 ENCOUNTER — Other Ambulatory Visit: Payer: Self-pay

## 2018-09-20 DIAGNOSIS — M65341 Trigger finger, right ring finger: Secondary | ICD-10-CM | POA: Diagnosis not present

## 2018-09-20 DIAGNOSIS — M65312 Trigger thumb, left thumb: Secondary | ICD-10-CM | POA: Diagnosis not present

## 2018-09-20 DIAGNOSIS — M65321 Trigger finger, right index finger: Secondary | ICD-10-CM

## 2018-09-20 NOTE — Progress Notes (Signed)
Office Visit Note   Patient: Miguel [name]           Date of Birth: [birth year]           MRN: [id number] Visit Date: 09/20/2018              Requested by: Alroy Dust, L.Marlou Sa, [place] [employer] [place],  [place] [address] PCP: Alroy Dust, L.Marlou Sa, MD   Assessment & Plan: Visit Diagnoses:  1. Trigger finger of left thumb   2. Trigger finger, right ring finger   3. Trigger index finger of right hand     Plan: I was able to provide steroid injections in his right ring and index finger A1 pulleys as well as his left thumb A1 pulley.  He tolerated these well.  All question concerns were answered addressed.  I have again recommended Voltaren gel.  This can be done is over-the-counter medication on these areas.  Follow-up is as needed.  Follow-Up Instructions: Return if symptoms worsen or fail to improve.   Orders:  Orders Placed This Encounter  Procedures  . Hand/UE Inj  . Hand/UE Inj  . Hand/UE Inj   No orders of the defined types were placed in this encounter.     Procedures: Hand/UE Inj: L thumb A1 for trigger finger on 09/20/2018 3:27 PM  Hand/UE Inj: R index A1 for trigger finger on 09/20/2018 3:27 PM  Hand/UE Inj: R ring A1 for trigger finger on 09/20/2018 3:27 PM      Clinical Data: No additional findings.   Subjective: Chief Complaint  Patient presents with  . Left Hand - Pain  . Right Hand - Pain  Patient comes in today with triggering of his right hand index and ring fingers as well as pain at the A1 pulley of his left hand at the thumb.  We have injected these areas before.  It is been at least 7 to 8 months since we injected the areas.  He is having surgery next week for hernia repairs.  He would like to have steroid injections today.  Of also recommended Voltaren gel.  HPI  Review of Systems He currently denies any headache, chest pain, shortness of breath, fever, chills, nausea, vomiting  Objective: Vital Signs: There were no vitals taken  for this visit.  Physical Exam He is alert and orient x3 and in no acute distress Ortho Exam Examination of both his right and left hand shows pain over the A1 pulley of his right ring and index finger as well as his left thumb with some active triggering.  His remainder hand exam bilaterally are normal. Specialty Comments:  No specialty comments available.  Imaging: No results found.   PMFS History: Patient Active Problem List   Diagnosis Date Noted  . Incisional hernia 03/17/2018  . Trigger thumb, left thumb 01/19/2018  . Trigger finger, left index finger 07/18/2017  . Trigger finger, right ring finger 01/31/2017  . Trigger finger of left thumb 01/03/2017  . Trigger finger of right thumb 01/03/2017  . Trigger index finger of left hand 01/03/2017  . Trigger index finger of right hand 01/03/2017  . Malnutrition of moderate degree 07/19/2016  . Intra-abdominal abscess ([place]) 07/16/2016  . Cellulitis 07/15/2016  . S/P colostomy takedown 06/28/2016  . Pain in thoracic spine 06/09/2016  . Mid back pain 06/09/2016  . Postoperative fever 11/19/2015  . 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 Severe protein-calorie malnutrition ([place]) 11/17/2015  . Sepsis ([place]) 11/14/2015  . Hiccups  11/14/2015  . AKI (acute kidney injury) ([place]) 11/14/2015  . Fever   . Leg swelling   . Left shoulder pain   . Muscle spasm of left shoulder   . GI bleed 10/27/2015  . Acute blood loss anemia 10/27/2015  . Syncope 10/27/2015  . Hyperglycemia 10/27/2015  . Hypotension 10/27/2015  . Neck pain 10/27/2015  . Hematemesis 10/27/2015  . Hematochezia 10/27/2015  . Diverticulitis of colon with perforation 07/05/2015   Past Medical History:  Diagnosis Date  . Anemia   . Anxiety   . BPH (benign prostatic hyperplasia)   . Gastritis   . GERD (gastroesophageal reflux disease)    "seldom" (07/16/2016)  . GI bleed due to NSAIDs 10/27/2015  . History of blood transfusion 06/2016   post OR/notes 07/15/2016  .  History of hiatal hernia   . History of kidney stones   . Hyperlipidemia   . Hypertension   . Melanoma of back ([place])    "mid back"  . Sigmoid diverticulitis    with perforation    Family History  Problem Relation Age of Onset  . Stroke Mother   . Stroke Brother   . Heart disease Brother     Past Surgical History:  Procedure Laterality Date  . COLON SURGERY     sigmoid  . COLOSTOMY TAKEDOWN N/A 06/28/2016   Procedure: COLOSTOMY TAKEDOWN;  Surgeon: [name], MD;  Location: [facility];  Service: General;  Laterality: N/A;  . ESOPHAGOGASTRODUODENOSCOPY N/A 10/27/2015   Procedure: ESOPHAGOGASTRODUODENOSCOPY (EGD);  Surgeon: Clarene Essex, MD;  Location[facility] ENDOSCOPY;  Service: Endoscopy;  Laterality: N/A;  . ESOPHAGOGASTRODUODENOSCOPY N/A 10/29/2015   Procedure: ESOPHAGOGASTRODUODENOSCOPY (EGD);  Surgeon: Ronald Lobo, MD;  Location[facility] ENDOSCOPY;  Service: Endoscopy;  Laterality: N/A;  . ESOPHAGOGASTRODUODENOSCOPY N/A 10/30/2015   Procedure: ESOPHAGOGASTRODUODENOSCOPY (EGD);  Surgeon: Clarene Essex, MD;  Location: [facility] ENDOSCOPY;  Service: Endoscopy;  Laterality: N/A;  . GASTROSTOMY TUBE PLACEMENT  11/21/2015   REDUCTION OF HIATAL HERNIA , REPAIR HIATAL HERNIA, RESECTION SMALL BOWEL WITH ANASTOMOSIS, PLACEMENT GASTROSTOMY TUBE, PLACEMENT DUODENOSTOMY TUBE (N/A)  . HEMORRHOID BANDING  X 2  . HERNIA REPAIR    . HIATAL HERNIA REPAIR N/A 11/21/2015   Procedure: REDUCTION OF HIATAL HERNIA , REPAIR HIATAL HERNIA, RESECTION SMALL BOWEL WITH ANASTOMOSIS, PLACEMENT GASTROSTOMY TUBE, PLACEMENT DUODENOSTOMY TUBE;  Surgeon: Mickeal Skinner, MD;  Location: [place];  Service: General;  Laterality: N/A;  . [place]  06/28/2016   open/notes 07/15/2016  . INCISIONAL HERNIA REPAIR  03/17/2018   WITH MESH  . INCISIONAL HERNIA REPAIR N/A 03/17/2018   Procedure: INCISIONAL HERNIA REPAIR WITH MESH;  Surgeon: [name], MD;  Location: [place];  Service: General;  Laterality: N/A;  .  INSERTION OF MESH N/A 03/17/2018   Procedure: INSERTION OF MESH;  Surgeon: [name], MD;  Location: [place];  Service: General;  Laterality: N/A;  . IR CM INJ ANY COLONIC TUBE W/FLUORO  02/04/2017  . IR GUIDED DRAIN W CATHETER PLACEMENT  07/06/2016   /NOTES 07/15/2016  . IR RADIOLOGIST EVAL & MGMT  07/27/2016  . IR RADIOLOGIST EVAL & MGMT  08/17/2016  . IR RADIOLOGIST EVAL & MGMT  08/26/2016  . IR REPLACE G-TUBE SIMPLE WO FLUORO  07/28/2017  . IR REPLACE G-TUBE SIMPLE WO FLUORO  01/31/2018  . IR [place] TUBE PERCUT W/FLUORO  08/04/2016  . IR [place] TUBE PERCUT W/FLUORO  01/14/2017  . IR US GUIDE BX ASP/DRAIN  08/04/2016  . KNEE CARTILAGE SURGERY Right  1971   "opened me up"  . LAPAROTOMY N/A 07/05/2015   Procedure: PARTIAL SIGMOID COLECTOMY AND COLOSTOMY;  Surgeon: [name], MD;  Location: [place];  Service: General;  Laterality: N/A;  . MELANOMA EXCISION  2001  . 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  10/30/2015   Procedure: REMOVAL OF DISTAL STOMACH;  Surgeon: Judeth Horn, MD;  Location: [place];  Service: General;;  . REPAIR OF PERFORATED ULCER N/A 10/30/2015   Procedure: REPAIR OF BLEEDING  ULCER;  Surgeon: Judeth Horn, MD;  Location: [place];  Service: General;  Laterality: N/A;  . TUMOR EXCISION  2009   "back; fatty tumor"   Social History   Occupational History  . Occupation: unable to work since April  Tobacco Use  . Smoking status: Never Smoker  . Smokeless tobacco: Former Systems developer    Types: Snuff  Substance and Sexual Activity  . Alcohol use: No  . Drug use: No  . Sexual activity: Yes

## 2018-09-21 DIAGNOSIS — K219 Gastro-esophageal reflux disease without esophagitis: Secondary | ICD-10-CM | POA: Diagnosis not present

## 2018-09-21 DIAGNOSIS — Z931 Gastrostomy status: Secondary | ICD-10-CM | POA: Diagnosis not present

## 2018-09-21 DIAGNOSIS — Z9884 Bariatric surgery status: Secondary | ICD-10-CM | POA: Diagnosis not present

## 2018-09-22 ENCOUNTER — Other Ambulatory Visit: Payer: Self-pay | Admitting: Surgery

## 2018-09-25 ENCOUNTER — Other Ambulatory Visit (HOSPITAL_COMMUNITY)
Admission: RE | Admit: 2018-09-25 | Discharge: 2018-09-25 | Disposition: A | Discharge status: Home / Self Care | Payer: Medicare Other | Source: Ambulatory Visit | Attending: Surgery | Admitting: Surgery

## 2018-09-25 ENCOUNTER — Encounter (HOSPITAL_COMMUNITY): Payer: Self-pay

## 2018-09-25 ENCOUNTER — Other Ambulatory Visit: Payer: Self-pay

## 2018-09-25 ENCOUNTER — Encounter (HOSPITAL_COMMUNITY)
Admission: RE | Admit: 2018-09-25 | Discharge: 2018-09-25 | Disposition: A | Discharge status: Home / Self Care | Payer: Medicare Other | Source: Ambulatory Visit | Attending: Surgery | Admitting: Surgery

## 2018-09-25 DIAGNOSIS — K4020 Bilateral inguinal hernia, without obstruction or gangrene, not specified as recurrent: Secondary | ICD-10-CM | POA: Diagnosis not present

## 2018-09-25 DIAGNOSIS — I10 Essential (primary) hypertension: Secondary | ICD-10-CM | POA: Diagnosis not present

## 2018-09-25 DIAGNOSIS — K219 Gastro-esophageal reflux disease without esophagitis: Secondary | ICD-10-CM | POA: Diagnosis not present

## 2018-09-25 DIAGNOSIS — Z1159 Encounter for screening for other viral diseases: Secondary | ICD-10-CM | POA: Diagnosis not present

## 2018-09-25 DIAGNOSIS — E785 Hyperlipidemia, unspecified: Secondary | ICD-10-CM | POA: Diagnosis not present

## 2018-09-25 DIAGNOSIS — Z79899 Other long term (current) drug therapy: Secondary | ICD-10-CM | POA: Diagnosis not present

## 2018-09-25 LAB — CBC
HCT: 39 % (ref 39.0–52.0)
Hemoglobin: 12.9 g/dL — ABNORMAL LOW (ref 13.0–17.0)
MCH: 31.2 pg (ref 26.0–34.0)
MCHC: 33.1 g/dL (ref 30.0–36.0)
MCV: 94.2 fL (ref 80.0–100.0)
Platelets: 191 10*3/uL (ref 150–400)
RBC: 4.14 MIL/uL — ABNORMAL LOW (ref 4.22–5.81)
RDW: 13.9 % (ref 11.5–15.5)
WBC: 4.5 10*3/uL (ref 4.0–10.5)
nRBC: 0 % (ref 0.0–0.2)

## 2018-09-25 LAB — BASIC METABOLIC PANEL
Anion gap: 9 (ref 5–15)
BUN: 15 mg/dL (ref 8–23)
CO2: 27 mmol/L (ref 22–32)
Calcium: 8.9 mg/dL (ref 8.9–10.3)
Chloride: 100 mmol/L (ref 98–111)
Creatinine, Ser: 0.8 mg/dL (ref 0.61–1.24)
GFR calc Af Amer: >60 mL/min (ref >60)
GFR calc non Af Amer: >60 mL/min (ref >60)
Glucose, Bld: 87 mg/dL (ref 70–99)
Potassium: 4.2 mmol/L (ref 3.5–5.1)
Sodium: 136 mmol/L (ref 135–145)

## 2018-09-25 LAB — SARS CORONAVIRUS 2 (TAT 6-24 HRS): SARS Coronavirus 2: NEGATIVE

## 2018-09-25 NOTE — Pre-Procedure Instructions (Signed)
Miguel [name]  09/25/2018      [id number] - [place], [place] [address] [name] [address] Phone: [phone] Fax: [phone]    Your procedure is scheduled on September 28, 2018.  Report to [facility] Entrance "A" at 530 AM.  Call this number if you have problems the morning of surgery:  [phone]   Remember:  Do not eat after midnight.  You may drink clear liquids until 430 AM.  Clear liquids allowed are:       Water, Juice (non-citric and without pulp), Clear Tea, Black Coffee only and Gatorade    Take these medicines the morning of surgery with A SIP OF WATER  Famotidine (pepcid) Pantoprzole (protonix) Tamsulosin (flomax) Tramadol-if needed for pain Lorazepam (ativan)-if needed Methocarbamol (robaxin)-if needed for muscle spasms Ondansetron (zofran)-if need for nausea Oxycodone-if needed for pain Tylenol-if needed  Beginning now, STOP taking any Aspirin (unless otherwise instructed by your surgeon), Aleve, Naproxen, Ibuprofen, Motrin, Advil, Goody's, BC's, all herbal medications, fish oil, and all vitamins    Do not wear jewelry  Do not wear lotions, powders, or colognes, or deodorant.  Men may shave face and neck.  Do not bring valuables to the hospital.  [facility] is not responsible for any belongings or valuables.  Contacts, dentures or bridgework may not be worn into surgery.  Leave your suitcase in the car.  After surgery it may be brought to your room.  For patients admitted to the hospital, discharge time will be determined by your treatment team.  Patients discharged the day of surgery will not be allowed to drive home.    [place]- Preparing For Surgery  Before surgery, you can play an important role. Because skin is not sterile, your skin needs to be as free of germs as possible. You can reduce the number of germs on your skin by washing with CHG (chlorahexidine  gluconate) Soap before surgery.  CHG is an antiseptic cleaner which kills germs and bonds with the skin to continue killing germs even after washing.    Oral Hygiene is also important to reduce your risk of infection.  Remember - BRUSH YOUR TEETH THE MORNING OF SURGERY WITH YOUR REGULAR TOOTHPASTE  Please do not use if you have an allergy to CHG or antibacterial soaps. If your skin becomes reddened/irritated stop using the CHG.  Do not shave (including legs and underarms) for at least 48 hours prior to first CHG shower. It is OK to shave your face.  Please follow these instructions carefully.   1. Shower the NIGHT BEFORE SURGERY and the MORNING OF SURGERY with CHG.   2. If you chose to wash your hair, wash your hair first as usual with your normal shampoo.  3. After you shampoo, rinse your hair and body thoroughly to remove the shampoo.  4. Use CHG as you would any other liquid soap. You can apply CHG directly to the skin and wash gently with a scrungie or a clean washcloth.   5. Apply the CHG Soap to your body ONLY FROM THE NECK DOWN.  Do not use on open wounds or open sores. Avoid contact with your eyes, ears, mouth and genitals (private parts). Wash Face and genitals (private parts)  with your normal soap.  6. Wash thoroughly, paying special attention to the area where your surgery will be performed.  7. Thoroughly rinse your body with warm water from  the neck down.  8. DO NOT shower/wash with your normal soap after using and rinsing off the CHG Soap.  9. Pat yourself dry with a CLEAN TOWEL.  10. Wear CLEAN PAJAMAS to bed the night before surgery, wear comfortable clothes the morning of surgery  11. Place CLEAN SHEETS on your bed the night of your first shower and DO NOT SLEEP WITH PETS.   Day of Surgery:  Do not apply any deodorants/lotions.  Please wear clean clothes to the hospital/surgery center.   Remember to brush your teeth WITH YOUR REGULAR TOOTHPASTE.  Please read  over the following fact sheets that you were given.

## 2018-09-25 NOTE — Progress Notes (Addendum)
PCP - L. Donnie Coffin, MD Cardiologist - pt denies  Chest x-ray - 01/02/18 in EPIC EKG - 03/09/18 in EPIC  Stress Test - pt denies ECHO - pt denies  Cardiac Cath - pt denies  Sleep Study - pt denies CPAP -  n/a  Fasting Blood Sugar - n/a Checks Blood Sugar _____ times a day-n/a  Blood Thinner Instructions: n/a Aspirin Instructions: n/a  Anesthesia review: Yes, EKG  Patient is on tube feedings, instructed to stop tube feedings at 2359, 09/27/2018.  Patient denies shortness of breath, fever, cough and chest pain at PAT appointment  Patient verbalized understanding of instructions that were given to them at the PAT appointment. Patient was also instructed that they will need to review over the PAT instructions again at home before surgery.

## 2018-09-26 NOTE — Progress Notes (Signed)
Anesthesia Chart Review:  Case: [id number] Date/Time: 09/28/18 0715   Procedure: BILATERAL OPEN INGUINAL HERNIA REPAIR WITH MESH (Bilateral ) - GENERAL AND TAP BLOCK   Anesthesia type: General   Pre-op diagnosis: INGUINAL HERNIA   Location: [facility] OR ROOM 02 / [place] OR   Surgeon: [name], MD      DISCUSSION: Patient is a 68 year old male scheduled for the above procedure.  History includes never smoker, HTN, HLD, perforated sigmoid diverticulitis (s/p sigmoid colectomy with end colostomy 07/05/15; colostomy takedown, segmental colectomy, incisional hernia repair 06/28/16; incisional hernia repair 03/17/18), GI bleed/bleeding gastric ulcer with large hiatal hernia (s/p repair of ulcer, partial distal gastrectomy with Bilroth II reconstruction/gastrojejunostomy 10/30/15; s/p reduction and repair of hiatal hernia, resection of small bowel with anastomosis placement of G-tube and duodenostomy tube 11/21/15), anemia, melanoma (s/p excision 2001), GERD, anxiety.  Per PAT RN: Patient is on tube feedings, instructed to stop tube feedings at 2359, 09/27/2018. Patient denies shortness of breath, fever, cough and chest pain at PAT appointment.  EKG showed stable LAFB. Labs acceptable for OR. Presurgical COVID-19 test was negative on 09/25/18.  No acute changes in anticipate that he can proceed as planned.   VS: BP 135/82   Pulse 79   Temp 36.8 C (Oral)   Resp 20   Ht 5\' 5"  (1.651 m)   Wt 60.4 kg   SpO2 100%   BMI 22.17 kg/m   PROVIDERS: Mitchell, L.Marlou Sa, MD is PCP   LABS: Labs reviewed: Acceptable for surgery. (all labs ordered are listed, but only abnormal results are displayed)  Labs Reviewed  CBC - Abnormal; Notable for the following components:      Result Value   RBC 4.14 (*)    Hemoglobin 12.9 (*)    All other components within normal limits  BASIC METABOLIC PANEL    IMAGES: CT abd/pelvis 06/15/18: IMPRESSION: - 8 x 7 x 3 cm abnormal collection in the anterior abdominal wall midline  which looks like an abdominal wall abscess. This could be a mesh complication. No abnormality seen in this region in November. - Gastrostomy has a good appearance. - No evidence of acute organ pathology. Nonobstructing 2 mm and 1 mm stones in the lower pole of the left kidney.  CXR 01/02/18: IMPRESSION: There is no pneumonia nor other acute cardiopulmonary abnormality. Thoracic aortic atherosclerosis.   EKG: 03/09/18: Normal sinus rhythm Left anterior fascicular block No significant change since last tracing 12/25/15 Confirmed by Minus Breeding [phone]) on 03/09/2018 8:58:27 PM  CV: N/A   Past Medical History:  Diagnosis Date  . Anemia   . Anxiety   . BPH (benign prostatic hyperplasia)   . Gastritis   . GERD (gastroesophageal reflux disease)    "seldom" (07/16/2016)  . GI bleed due to NSAIDs 10/27/2015  . History of blood transfusion 06/2016   post OR/notes 07/15/2016  . History of hiatal hernia   . History of kidney stones   . Hyperlipidemia   . Hypertension   . Melanoma of back ([place])    "mid back"  . Sigmoid diverticulitis    with perforation    Past Surgical History:  Procedure Laterality Date  . COLON SURGERY     sigmoid  . COLOSTOMY TAKEDOWN N/A 06/28/2016   Procedure: COLOSTOMY TAKEDOWN;  Surgeon: [name], MD;  Location: [facility];  Service: General;  Laterality: N/A;  . ESOPHAGOGASTRODUODENOSCOPY N/A 10/27/2015   Procedure: ESOPHAGOGASTRODUODENOSCOPY (EGD);  Surgeon: Clarene Essex, MD;  Location: [facility];  Service:  Endoscopy;  Laterality: N/A;  . ESOPHAGOGASTRODUODENOSCOPY N/A 10/29/2015   Procedure: ESOPHAGOGASTRODUODENOSCOPY (EGD);  Surgeon: Ronald Lobo, MD;  Location[facility] END[facility]COPY;  Service: Endoscopy;  Laterality: N/A;  . ESOPHAGOGASTRODUODENOSCOPY N/A 10/30/2015   Procedure: ESOPHAGOGASTRODUODENOSCOPY (EGD);  Surgeon: Clarene Essex, MD;  Location: [facility] ENDOSCOPY;  Service: Endoscopy;  Laterality: N/A;  . GASTROSTOMY TUBE PLACEMENT  11/21/2015   REDUCTION  OF HIATAL HERNIA , REPAIR HIATAL HERNIA, RESECTION SMALL BOWEL WITH ANASTOMOSIS, PLACEMENT GASTROSTOMY TUBE, PLACEMENT DUODENOSTOMY TUBE (N/A)  . HEMORRHOID BANDING  X 2  . HERNIA REPAIR    . HIATAL HERNIA REPAIR N/A 11/21/2015   Procedure: REDUCTION OF HIATAL HERNIA , REPAIR HIATAL HERNIA, RESECTION SMALL BOWEL WITH ANASTOMOSIS, PLACEMENT GASTROSTOMY TUBE, PLACEMENT DUODENOSTOMY TUBE;  Surgeon: Mickeal Skinner, MD;  Location: [place];  Service: General;  Laterality: N/A;  . [place]  06/28/2016   open/notes 07/15/2016  . INCISIONAL HERNIA REPAIR  03/17/2018   WITH MESH  . INCISIONAL HERNIA REPAIR N/A 03/17/2018   Procedure: INCISIONAL HERNIA REPAIR WITH MESH;  Surgeon: [name], MD;  Location: [facility];  Service: General;  Laterality: N/A;  . INSERTION OF MESH N/A 03/17/2018   Procedure: INSERTION OF MESH;  Surgeon: [name], MD;  Location: [facility];  Service: General;  Laterality: N/A;  . IR CM INJ ANY COLONIC TUBE W/FLUORO  02/04/2017  . IR GUIDED DRAIN W CATHETER PLACEMENT  07/06/2016   /NOTES 07/15/2016  . IR RADIOLOGIST EVAL & MGMT  07/27/2016  . IR RADIOLOGIST EVAL & MGMT  08/17/2016  . IR RADIOLOGIST EVAL & MGMT  08/26/2016  . IR REPLACE G-TUBE SIMPLE WO FLUORO  07/28/2017  . IR REPLACE G-TUBE SIMPLE WO FLUORO  01/31/2018  . IR [place] TUBE PERCUT W/FLUORO  08/04/2016  . IR [place] TUBE PERCUT W/FLUORO  01/14/2017  . IR US GUIDE BX ASP/DRAIN  08/04/2016  . KNEE CARTILAGE SURGERY Right 1971   "opened me up"  . LAPAROTOMY N/A 07/05/2015   Procedure: PARTIAL SIGMOID COLECTOMY AND COLOSTOMY;  Surgeon: [name], MD;  Location: [facility];  Service: General;  Laterality: N/A;  . MELANOMA EXCISION  2001  . 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  10/30/2015   Procedure: REMOVAL OF DISTAL STOMACH;  Surgeon: Judeth Horn, MD;  Location: [facility];  Service: General;;  . REPAIR OF PERFORATED ULCER N/A 10/30/2015   Procedure: REPAIR OF  BLEEDING  ULCER;  Surgeon: Judeth Horn, MD;  Location: [facility];  Service: General;  Laterality: N/A;  . TUMOR EXCISION  2009   "back; fatty tumor"    MEDICATIONS: . acetaminophen (TYLENOL) 500 MG tablet  . diclofenac sodium (VOLTAREN) 1 % GEL  . famotidine (PEPCID) 20 MG tablet  . LORazepam (ATIVAN) 1 MG tablet  . methocarbamol (ROBAXIN) 500 MG tablet  . Multiple Vitamin (MULTIVITAMIN WITH MINERALS) TABS tablet  . Omega-3 Fatty Acids (FISH OIL) 1000 MG CAPS  . ondansetron (ZOFRAN-ODT) 4 MG disintegrating tablet  . oxyCODONE (OXY IR/ROXICODONE) 5 MG immediate release tablet  . Oxycodone HCl 10 MG TABS  . pantoprazole (PROTONIX) 40 MG tablet  . Probiotic Product (PROBIOTIC DAILY PO)  . sodium chloride flush (NS) 0.9 % SOLN  . tamsulosin (FLOMAX) 0.4 MG CAPS capsule  . traMADol (ULTRAM) 50 MG tablet  . vitamin B-12 (CYANOCOBALAMIN) 1000 MCG tablet  . zolpidem (AMBIEN) 10 MG tablet   No current facility-administered medications for this encounter.    [place] Kitchen 0.9 % NaCl with KCl 20 mEq/ L  infusion  . acetaminophen (TYLENOL) tablet 650 mg   Or  . acetaminophen (TYLENOL) suppository 650 mg  . morphine 4 MG/ML injection 1-4 mg  . ondansetron (ZOFRAN-ODT) disintegrating tablet 4 mg   Or  . ondansetron (ZOFRAN) 4 mg in sodium chloride 0.9 % 50 mL IVPB  . oxyCODONE (Oxy IR/ROXICODONE) immediate release tablet 5-10 mg    Myra Gianotti, PA-C Surgical Short Stay/Anesthesiology [facility] Phone [phone] [facility] Phone [phone] 09/26/2018 1:02 PM

## 2018-09-26 NOTE — Anesthesia Preprocedure Evaluation (Addendum)
Anesthesia Evaluation  Patient identified by MRN, date of birth, ID band Patient awake    Reviewed: Allergy & Precautions, NPO status , Patient's Chart, lab work & pertinent test results  Airway Mallampati: I  TM Distance: >3 FB Neck ROM: Full    Dental  (+) Teeth Intact, Dental Advisory Given   Pulmonary    Pulmonary exam normal        Cardiovascular hypertension, Pt. on medications Normal cardiovascular exam     Neuro/Psych Anxiety    GI/Hepatic GERD  Medicated,  Endo/Other    Renal/GU      Musculoskeletal   Abdominal   Peds  Hematology   Anesthesia Other Findings   Reproductive/Obstetrics                          Anesthesia Physical Anesthesia Plan  ASA: II  Anesthesia Plan: General   Post-op Pain Management:  Regional for Post-op pain   Induction: Intravenous  PONV Risk Score and Plan: 2 and Ondansetron and Dexamethasone  Airway Management Planned: Oral ETT  Additional Equipment:   Intra-op Plan:   Post-operative Plan: Extubation in OR  Informed Consent: I have reviewed the patients History and Physical, chart, labs and discussed the procedure including the risks, benefits and alternatives for the proposed anesthesia with the patient or authorized representative who has indicated his/her understanding and acceptance.       Plan Discussed with: CRNA and Surgeon  Anesthesia Plan Comments: (PAT note written 09/26/2018 by Myra Gianotti, PA-C. )       Anesthesia Quick Evaluation

## 2018-09-27 NOTE — H&P (Signed)
Miguel [name]  Location: [facility] Patient #: [id number] DOB: [birth year] Married / Language: English / Race: White Male   History of Present Illness The patient is a 68 year old male who presents for an evaluation of a hernia. He is here today because of increasing pain in his inguinal hernias. He is now 5 months status post repair of the ventral incisional hernia. He reports mostly burning pain going down into the testicle on the left side with minimal discomfort on the right. He reports, however, the bulge is larger on the right. He has had no obstructive symptoms. He continues to feeds at night.  Past Medical History:  Diagnosis Date  . Anemia   . Anxiety   . BPH (benign prostatic hyperplasia)   . Gastritis   . GERD (gastroesophageal reflux disease)    "seldom" (07/16/2016)  . GI bleed due to NSAIDs 10/27/2015  . History of blood transfusion 06/2016   post OR/notes 07/15/2016  . History of hiatal hernia   . History of kidney stones   . Hyperlipidemia   . Hypertension   . Melanoma of back ([place])    "mid back"  . Sigmoid diverticulitis    with perforation         Past Surgical History:  Procedure Laterality Date  . COLON SURGERY     sigmoid  . COLOSTOMY TAKEDOWN N/A 06/28/2016   Procedure: COLOSTOMY TAKEDOWN;  Surgeon: [name], MD;  Location: [place];  Service: General;  Laterality: N/A;  . ESOPHAGOGASTRODUODENOSCOPY N/A 10/27/2015   Procedure: ESOPHAGOGASTRODUODENOSCOPY (EGD);  Surgeon: Clarene Essex, MD;  Location: [facility] ENDOSCOPY;  Service: Endoscopy;  Laterality: N/A;  . ESOPHAGOGASTRODUODENOSCOPY N/A 10/29/2015   Procedure: ESOPHAGOGASTRODUODENOSCOPY (EGD);  Surgeon: Ronald Lobo, MD;  Location: [facility] ENDOSCOPY;  Service: Endoscopy;  Laterality: N/A;  . ESOPHAGOGASTRODUODENOSCOPY N/A 10/30/2015   Procedure: ESOPHAGOGASTRODUODENOSCOPY (EGD);  Surgeon: Clarene Essex, MD;  Location: [facility] ENDOSCOPY;  Service: Endoscopy;  Laterality: N/A;  .  GASTROSTOMY TUBE PLACEMENT  11/21/2015   REDUCTION OF HIATAL HERNIA , REPAIR HIATAL HERNIA, RESECTION SMALL BOWEL WITH ANASTOMOSIS, PLACEMENT GASTROSTOMY TUBE, PLACEMENT DUODENOSTOMY TUBE (N/A)  . HEMORRHOID BANDING  X 2  . HERNIA REPAIR    . HIATAL HERNIA REPAIR N/A 11/21/2015   Procedure: REDUCTION OF HIATAL HERNIA , REPAIR HIATAL HERNIA, RESECTION SMALL BOWEL WITH ANASTOMOSIS, PLACEMENT GASTROSTOMY TUBE, PLACEMENT DUODENOSTOMY TUBE;  Surgeon: Mickeal Skinner, MD;  Location: [place];  Service: General;  Laterality: N/A;  . [place]  06/28/2016   open/notes 07/15/2016  . IR CM INJ ANY COLONIC TUBE W/FLUORO  02/04/2017  . IR GUIDED DRAIN W CATHETER PLACEMENT  07/06/2016   /NOTES 07/15/2016  . IR RADIOLOGIST EVAL & MGMT  07/27/2016  . IR RADIOLOGIST EVAL & MGMT  08/17/2016  . IR RADIOLOGIST EVAL & MGMT  08/26/2016  . IR REPLACE G-TUBE SIMPLE WO FLUORO  07/28/2017  . IR REPLACE G-TUBE SIMPLE WO FLUORO  01/31/2018  . IR [place] TUBE PERCUT W/FLUORO  08/04/2016  . IR [place] TUBE PERCUT W/FLUORO  01/14/2017  . IR US GUIDE BX ASP/DRAIN  08/04/2016  . KNEE CARTILAGE SURGERY Right 1971   "opened me up"  . LAPAROTOMY N/A 07/05/2015   Procedure: PARTIAL SIGMOID COLECTOMY AND COLOSTOMY;  Surgeon: [name], MD;  Location: [place];  Service: General;  Laterality: N/A;  . MELANOMA EXCISION  2001  . 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 REPAIR OF PERFORATED ULCER N/A 10/30/2015   Procedure: REPAIR OF BLEEDING  ULCER;  Surgeon: Judeth Horn, MD;  Location: [place];  Service: General;  Laterality: N/A;  . TUMOR EXCISION  2009   "back; fatty tumor"     Allergies No Known Drug Allergies  Allergies Reconciled   Medication History  oxyCODONE HCl (10MG  Tablet, 1 (one) Oral every six hours as needed for pain,  Taken starting 07/11/2018) Active. Ondansetron (4MG  Tablet Disint, 1 (one) Oral every six hours, as needed, Taken starting 07/11/2018) Active. Methocarbamol (500MG  Tablet, 1-2 Oral every eight hours, as needed for muscle pain, Taken starting 07/11/2018) Active. Cyclobenzaprine HCl (5MG  Tablet, 1 (one) Oral three times daily, as needed, Taken starting 06/22/2018) Active. Pantoprazole Sodium (40MG  Tablet DR, Oral) Active. TiZANidine HCl (2MG  Tablet, Oral) Active. LORazepam (1MG  Tablet, Oral) Active. Ferrous Sulfate (325 (65 Fe)MG Tablet, Oral) Active. Ambien (10MG  Tablet, Oral daily) Active. Tylenol (500MG  Capsule, Oral as needed) Active. Multiple Vitamin (1 (one) Oral daily) Active. Omega-3 Fatty Acids (1200MG  Capsule, Oral daily) Active. Red Yeast Rice Extract (300MG  Capsule, Oral daily) Active. Align (4MG  Capsule, Oral) Active. Medications Reconciled  Vitals   Weight: 1318 lb Height: 65in Body Surface Area: 4.41 m Body Mass Index: 219.32 kg/m  Temp.: 98.80F (Oral)  Pulse: 101 (Regular)  BP: 132/84(Sitting, Left Arm, Standard)       Physical Exam  The physical exam findings are as follows: Note: On examination, he has bilateral reducible inguinal hernias. There is tenderness on the left. He has a lax abdominal wall. His G-tube site is clean Lungs clear Cv RRR Skin without rash or erythema Psych normal    Assessment & Plan   BILATERAL INGUINAL HERNIA (K40.20) Impression: Because he is becoming increasingly symptomatic, we will go ahead and proceed with a bilateral open inguinal hernia repair with mesh. I discussed procedure with him in detail. We discussed the risks of bleeding, infection, injury to surrounding structures, chronic pain, hernia recurrence, postoperative recovery, etc. He understands and wishes to proceed with surgery which will be scheduled

## 2018-09-28 ENCOUNTER — Ambulatory Visit (HOSPITAL_COMMUNITY): Payer: Medicare Other | Admitting: Vascular Surgery

## 2018-09-28 ENCOUNTER — Other Ambulatory Visit: Payer: Self-pay

## 2018-09-28 ENCOUNTER — Observation Stay (HOSPITAL_COMMUNITY)
Admission: RE | Admit: 2018-09-28 | Discharge: 2018-09-28 | Disposition: A | Discharge status: Home / Self Care | Payer: Medicare Other | Source: Ambulatory Visit | Attending: Surgery | Admitting: Surgery

## 2018-09-28 ENCOUNTER — Encounter (HOSPITAL_COMMUNITY): Payer: Self-pay

## 2018-09-28 ENCOUNTER — Ambulatory Visit (HOSPITAL_COMMUNITY): Payer: Medicare Other | Admitting: Anesthesiology

## 2018-09-28 ENCOUNTER — Encounter (HOSPITAL_COMMUNITY)
Admission: RE | Disposition: A | Discharge status: Home / Self Care | Payer: Self-pay | Source: Ambulatory Visit | Attending: Surgery

## 2018-09-28 DIAGNOSIS — E785 Hyperlipidemia, unspecified: Secondary | ICD-10-CM | POA: Diagnosis not present

## 2018-09-28 DIAGNOSIS — Z79899 Other long term (current) drug therapy: Secondary | ICD-10-CM | POA: Diagnosis not present

## 2018-09-28 DIAGNOSIS — K4020 Bilateral inguinal hernia, without obstruction or gangrene, not specified as recurrent: Secondary | ICD-10-CM | POA: Diagnosis not present

## 2018-09-28 DIAGNOSIS — Z1159 Encounter for screening for other viral diseases: Secondary | ICD-10-CM | POA: Insufficient documentation

## 2018-09-28 DIAGNOSIS — E43 Unspecified severe protein-calorie malnutrition: Secondary | ICD-10-CM | POA: Diagnosis not present

## 2018-09-28 DIAGNOSIS — K4090 Unilateral inguinal hernia, without obstruction or gangrene, not specified as recurrent: Secondary | ICD-10-CM | POA: Diagnosis not present

## 2018-09-28 DIAGNOSIS — I10 Essential (primary) hypertension: Secondary | ICD-10-CM | POA: Insufficient documentation

## 2018-09-28 DIAGNOSIS — K219 Gastro-esophageal reflux disease without esophagitis: Secondary | ICD-10-CM | POA: Diagnosis not present

## 2018-09-28 DIAGNOSIS — G8918 Other acute postprocedural pain: Secondary | ICD-10-CM | POA: Diagnosis not present

## 2018-09-28 HISTORY — PX: INGUINAL HERNIA REPAIR: SUR1180

## 2018-09-28 HISTORY — PX: INGUINAL HERNIA REPAIR: SHX194

## 2018-09-28 HISTORY — PX: INSERTION OF MESH: SHX5868

## 2018-09-28 SURGERY — REPAIR, HERNIA, INGUINAL, ADULT
Anesthesia: General | Site: Inguinal | Laterality: Bilateral

## 2018-09-28 MED ORDER — MIDAZOLAM HCL 2 MG/2ML IJ SOLN
INTRAMUSCULAR | Status: AC
  Filled 2018-09-28: qty 2

## 2018-09-28 MED ORDER — GABAPENTIN 300 MG PO CAPS
300.0000 mg | ORAL_CAPSULE | ORAL | Status: AC
  Administered 2018-09-28: 06:00:00 300 mg via ORAL
  Filled 2018-09-28: qty 1

## 2018-09-28 MED ORDER — LACTATED RINGERS IV SOLN
INTRAVENOUS | Status: DC | PRN
  Administered 2018-09-28: 07:00:00 via INTRAVENOUS

## 2018-09-28 MED ORDER — CEFAZOLIN SODIUM-DEXTROSE 2-4 GM/100ML-% IV SOLN
2.0000 g | INTRAVENOUS | Status: AC
  Administered 2018-09-28: 2 g via INTRAVENOUS
  Filled 2018-09-28: qty 100

## 2018-09-28 MED ORDER — PROPOFOL 10 MG/ML IV BOLUS
INTRAVENOUS | Status: AC
  Filled 2018-09-28: qty 20

## 2018-09-28 MED ORDER — MEPERIDINE HCL 25 MG/ML IJ SOLN: 6.2500 mg | INTRAMUSCULAR | Status: DC | PRN

## 2018-09-28 MED ORDER — DEXAMETHASONE SODIUM PHOSPHATE 10 MG/ML IJ SOLN
INTRAMUSCULAR | Status: DC | PRN
  Administered 2018-09-28: 10 mg via INTRAVENOUS

## 2018-09-28 MED ORDER — LIDOCAINE-EPINEPHRINE (PF) 1.5 %-1:200000 IJ SOLN
INTRAMUSCULAR | Status: DC | PRN
  Administered 2018-09-28: 30 mL

## 2018-09-28 MED ORDER — ACETAMINOPHEN 500 MG PO TABS
1000.0000 mg | ORAL_TABLET | ORAL | Status: DC
  Filled 2018-09-28: qty 2

## 2018-09-28 MED ORDER — BUPIVACAINE-EPINEPHRINE (PF) 0.5% -1:200000 IJ SOLN
INTRAMUSCULAR | Status: AC
  Filled 2018-09-28: qty 30

## 2018-09-28 MED ORDER — LIDOCAINE 2% (20 MG/ML) 5 ML SYRINGE
INTRAMUSCULAR | Status: DC | PRN
  Administered 2018-09-28: 100 mg via INTRAVENOUS

## 2018-09-28 MED ORDER — HYDROMORPHONE HCL 1 MG/ML IJ SOLN
INTRAMUSCULAR | Status: AC
  Filled 2018-09-28: qty 1

## 2018-09-28 MED ORDER — ONDANSETRON HCL 4 MG/2ML IJ SOLN
INTRAMUSCULAR | Status: DC | PRN
  Administered 2018-09-28: 4 mg via INTRAVENOUS

## 2018-09-28 MED ORDER — HYDROMORPHONE HCL 1 MG/ML IJ SOLN
0.2500 mg | INTRAMUSCULAR | Status: DC | PRN
  Administered 2018-09-28: 0.5 mg via INTRAVENOUS

## 2018-09-28 MED ORDER — PROPOFOL 10 MG/ML IV BOLUS
INTRAVENOUS | Status: DC | PRN
  Administered 2018-09-28: 100 mg via INTRAVENOUS

## 2018-09-28 MED ORDER — ONDANSETRON HCL 4 MG/2ML IJ SOLN
INTRAMUSCULAR | Status: AC
  Filled 2018-09-28: qty 2

## 2018-09-28 MED ORDER — FENTANYL CITRATE (PF) 250 MCG/5ML IJ SOLN
INTRAMUSCULAR | Status: AC
  Filled 2018-09-28: qty 5

## 2018-09-28 MED ORDER — FENTANYL CITRATE (PF) 100 MCG/2ML IJ SOLN
INTRAMUSCULAR | Status: DC | PRN
  Administered 2018-09-28: 100 ug via INTRAVENOUS
  Administered 2018-09-28: 50 ug via INTRAVENOUS

## 2018-09-28 MED ORDER — OXYCODONE HCL 5 MG PO TABS: 5.0000 mg | ORAL_TABLET | Freq: Four times a day (QID) | ORAL | 0 refills | Status: DC | PRN

## 2018-09-28 MED ORDER — LIDOCAINE 2% (20 MG/ML) 5 ML SYRINGE
INTRAMUSCULAR | Status: AC
  Filled 2018-09-28: qty 5

## 2018-09-28 MED ORDER — SUCCINYLCHOLINE CHLORIDE 200 MG/10ML IV SOSY
PREFILLED_SYRINGE | INTRAVENOUS | Status: DC | PRN
  Administered 2018-09-28: 100 mg via INTRAVENOUS

## 2018-09-28 MED ORDER — MIDAZOLAM HCL 5 MG/5ML IJ SOLN
INTRAMUSCULAR | Status: DC | PRN
  Administered 2018-09-28: 2 mg via INTRAVENOUS

## 2018-09-28 MED ORDER — CHLORHEXIDINE GLUCONATE CLOTH 2 % EX PADS: 6.0000 | MEDICATED_PAD | Freq: Once | CUTANEOUS | Status: DC

## 2018-09-28 MED ORDER — BUPIVACAINE-EPINEPHRINE 0.5% -1:200000 IJ SOLN
INTRAMUSCULAR | Status: DC | PRN
  Administered 2018-09-28: 27 mL

## 2018-09-28 MED ORDER — ONDANSETRON HCL 4 MG/2ML IJ SOLN: 4.0000 mg | Freq: Once | INTRAMUSCULAR | Status: DC | PRN

## 2018-09-28 MED ORDER — 0.9 % SODIUM CHLORIDE (POUR BTL) OPTIME
TOPICAL | Status: DC | PRN
  Administered 2018-09-28: 1000 mL

## 2018-09-28 MED ORDER — BUPIVACAINE-EPINEPHRINE (PF) 0.5% -1:200000 IJ SOLN
INTRAMUSCULAR | Status: DC | PRN
  Administered 2018-09-28: 30 mL

## 2018-09-28 SURGICAL SUPPLY — 37 items
ADH SKN CLS APL DERMABOND .7 (GAUZE/BANDAGES/DRESSINGS) ×1
APL PRP STRL LF DISP 70% ISPRP (MISCELLANEOUS) ×1
BLADE CLIPPER SURG (BLADE) IMPLANT
CHLORAPREP W/TINT 26 (MISCELLANEOUS) ×3 IMPLANT
COVER SURGICAL LIGHT HANDLE (MISCELLANEOUS) ×3 IMPLANT
COVER WAND RF STERILE (DRAPES) IMPLANT
DERMABOND ADVANCED (GAUZE/BANDAGES/DRESSINGS) ×2
DERMABOND ADVANCED .7 DNX12 (GAUZE/BANDAGES/DRESSINGS) ×1 IMPLANT
DRAIN PENROSE 1/2X12 LTX STRL (WOUND CARE) ×2 IMPLANT
DRAPE LAPAROTOMY TRNSV 102X78 (DRAPES) ×3 IMPLANT
ELECT REM PT RETURN 9FT ADLT (ELECTROSURGICAL) ×3
ELECTRODE REM PT RTRN 9FT ADLT (ELECTROSURGICAL) ×1 IMPLANT
GLOVE SURG SIGNA 7.5 PF LTX (GLOVE) ×3 IMPLANT
GOWN STRL REUS W/ TWL LRG LVL3 (GOWN DISPOSABLE) ×1 IMPLANT
GOWN STRL REUS W/ TWL XL LVL3 (GOWN DISPOSABLE) ×1 IMPLANT
GOWN STRL REUS W/TWL LRG LVL3 (GOWN DISPOSABLE) ×3
GOWN STRL REUS W/TWL XL LVL3 (GOWN DISPOSABLE) ×3
KIT BASIN OR (CUSTOM PROCEDURE TRAY) ×3 IMPLANT
KIT TURNOVER KIT B (KITS) ×3 IMPLANT
MESH PARIETEX PROGRIP LEFT (Mesh General) ×2 IMPLANT
MESH PARIETEX PROGRIP RIGHT (Mesh General) ×2 IMPLANT
NDL HYPO 25GX1X1/2 BEV (NEEDLE) ×1 IMPLANT
NEEDLE HYPO 25GX1X1/2 BEV (NEEDLE) ×3 IMPLANT
NS IRRIG 1000ML POUR BTL (IV SOLUTION) ×3 IMPLANT
PACK GENERAL/GYN (CUSTOM PROCEDURE TRAY) ×3 IMPLANT
PAD ARMBOARD 7.5X6 YLW CONV (MISCELLANEOUS) ×3 IMPLANT
PENCIL SMOKE EVACUATOR (MISCELLANEOUS) ×3 IMPLANT
SUT MON AB 4-0 PC3 18 (SUTURE) ×5 IMPLANT
SUT SILK 2 0 SH (SUTURE) ×4 IMPLANT
SUT VIC AB 2-0 CT1 27 (SUTURE) ×3
SUT VIC AB 2-0 CT1 36 (SUTURE) ×2 IMPLANT
SUT VIC AB 2-0 CT1 TAPERPNT 27 (SUTURE) ×1 IMPLANT
SUT VIC AB 3-0 CT1 27 (SUTURE) ×6
SUT VIC AB 3-0 CT1 TAPERPNT 27 (SUTURE) ×1 IMPLANT
SYR CONTROL 10ML LL (SYRINGE) ×3 IMPLANT
TOWEL GREEN STERILE (TOWEL DISPOSABLE) ×3 IMPLANT
TOWEL GREEN STERILE FF (TOWEL DISPOSABLE) ×3 IMPLANT

## 2018-09-28 NOTE — Progress Notes (Signed)
Discharged home today,patient's wife driving him home. Discharged instructions, personal belongings given to patient. Advised to pick up medication called in to pharmacy of choice. Verbalized understanding of instructions

## 2018-09-28 NOTE — Anesthesia Postprocedure Evaluation (Signed)
Anesthesia Post Note  Patient: Miguel [name]  Procedure(s) Performed: BILATERAL OPEN INGUINAL HERNIA REPAIR WITH MESH (Bilateral Inguinal) Insertion Of Mesh (Bilateral Inguinal)     Patient location during evaluation: PACU Anesthesia Type: General Level of consciousness: awake and alert Pain management: pain level controlled Vital Signs Assessment: post-procedure vital signs reviewed and stable Respiratory status: spontaneous breathing, nonlabored ventilation, respiratory function stable and patient connected to nasal cannula oxygen Cardiovascular status: blood pressure returned to baseline and stable Postop Assessment: no apparent nausea or vomiting Anesthetic complications: no    Last Vitals:  Vitals:   09/28/18 0950 09/28/18 1332  BP: 134/87 135/88  Pulse: 73 81  Resp: 15 15  Temp: 36.8 C 36.9 C  SpO2: 100% 99%    Last Pain:  Vitals:   09/28/18 1332  TempSrc: Oral  PainSc:                  [name] DAVID

## 2018-09-28 NOTE — Op Note (Signed)
BILATERAL OPEN INGUINAL HERNIA REPAIR WITH MESH, Insertion Of Mesh  Procedure Note  Miguel [name] 09/28/2018   Pre-op Diagnosis: BILATERAL INGUINAL HERNIA     Post-op Diagnosis: SAME  Procedure(s): BILATERAL OPEN INGUINAL HERNIA REPAIR WITH MESH Insertion Of Mesh  Surgeon(s): [name], MD  Anesthesia: General  Staff:  Circulator: Rosanne Sack, RN; Rometta Emery, RN Scrub Person: Merian Capron Circulator Assistant: Marliss Czar, RN  Estimated Blood Loss: Minimal               Findings: The patient was found to have an indirect right inguinal hernia and a direct left inguinal hernia.  Both were repaired with separate pieces of Covidien pro-grip Prolene mesh  Procedure: The patient was brought to the operating room and identifies correct patient.  He had already had bilateral tap blocks performed by anesthesia in the preoperative holding area.  He was placed upon the operating room table and general anesthesia was induced.  His abdomen was then prepped and draped in usual sterile fashion.  I anesthetized the skin in the right inguinal area with Marcaine.  I then made a longitudinal incision with a scalpel.  I dissected through Scarpa's fascia with the cautery.  I then opened the external oblique fascia toward the internal and external rings.  The testicular cord was then controlled with a Penrose drain.  The patient had a moderate sized indirect hernia sac which I separated from the cord structures and then reduced it back to the internal ring.  This was after reducing the contents.  I then reinforced the internal ring with a silk suture.  A right-sided piece of pro-grip mesh was then brought to the field.  I placed that against the pubic tubercle and then brought around the cord structures.  It was then sutured in place with 2-0 Vicryl sutures.  Good coverage of the inguinal floor appeared to be achieved.  I then closed the external beak fascia over the top of the mesh with  a running 2-0 Vicryl suture.  I anesthetized the fascia further with Marcaine.  I then closed Scarpa's fascia with interrupted 3-0 Vicryl sutures and closed the skin with a running 4-0 Monocryl.  Next I anesthetized the skin in the left inguinal area with Marcaine.  I made a longitudinal incision with a scalpel.  I dissected through Scarpa's fascia with the cautery and identified the external beak fascia which was then opened toward the internal and external rings.  The testicular cord and structures were controlled the Penrose drain.  The patient had a moderate sized direct hernia defect without evidence of indirect hernia.  I closed the defect with a figure-of-eight 2-0 silk suture.  I then brought a left-sided piece of pro-grip mesh onto the field.  It was placed against the pubic tubercle and brought around the cord structures and then sutured in place with a 2-0 Vicryl suture.  Again good repair of the floor appeared to be achieved.  I closed the external lead fascia over the mesh with a running 2-0 Vicryl suture.  I anesthetized the fascia further with Marcaine.  I then closed the subcutaneous tissue with interrupted 3-0 Vicryl sutures and closed the skin with a running 4-0 Monocryl.  Dermabond was then applied to both incisions.  The patient tolerated the procedure well.  All the counts were correct at the end of the procedure.  The patient was then extubated in the operating room and taken in a stable condition to the  recovery room.          [name]   Date: 09/28/2018  Time: 8:36 AM

## 2018-09-28 NOTE — Transfer of Care (Signed)
Immediate Anesthesia Transfer of Care Note  Patient: Miguel [name]  Procedure(s) Performed: BILATERAL OPEN INGUINAL HERNIA REPAIR WITH MESH (Bilateral Inguinal) Insertion Of Mesh (Bilateral Inguinal)  Patient Location: PACU  Anesthesia Type:General  Level of Consciousness: oriented, drowsy and patient cooperative  Airway & Oxygen Therapy: Patient Spontanous Breathing and Patient connected to nasal cannula oxygen  Post-op Assessment: Report given to RN and Post -op Vital signs reviewed and stable  Post vital signs: Reviewed  Last Vitals:  Vitals Value Taken Time  BP 139/83 09/28/18 0850  Temp    Pulse 82 09/28/18 0850  Resp 17 09/28/18 0850  SpO2 100 % 09/28/18 0850  Vitals shown include unvalidated device data.  Last Pain:  Vitals:   09/28/18 0609  TempSrc: Oral      Patients Stated Pain Goal: 2 (40/98/11 9147)  Complications: No apparent anesthesia complications

## 2018-09-28 NOTE — Discharge Instructions (Signed)
[facility] _______[facility] ° °UMBILICAL OR INGUINAL HERNIA REPAIR: POST OP INSTRUCTIONS ° °Always review your discharge instruction sheet given to you by the facility where your surgery was performed. °IF YOU HAVE DISABILITY OR FAMILY LEAVE FORMS, YOU MUST BRING THEM TO THE OFFICE FOR PROCESSING.   °DO NOT GIVE THEM TO YOUR DOCTOR. ° °1. A  prescription for pain medication may be given to you upon discharge.  Take your pain medication as prescribed, if needed.  If narcotic pain medicine is not needed, then you may take acetaminophen (Tylenol) or ibuprofen (Advil) as needed. °2. Take your usually prescribed medications unless otherwise directed. °If you need a refill on your pain medication, please contact your pharmacy.  They will contact our office to request authorization. Prescriptions will not be filled after 5 pm or on week-ends. °3. You should follow a light diet the first 24 hours after arrival home, such as soup and crackers, etc.  Be sure to include lots of fluids daily.  Resume your normal diet the day after surgery. °4.Most patients will experience some swelling and bruising around the umbilicus or in the groin and scrotum.  Ice packs and reclining will help.  Swelling and bruising can take several days to resolve.  °6. It is common to experience some constipation if taking pain medication after surgery.  Increasing fluid intake and taking a stool softener (such as Colace) will usually help or prevent this problem from occurring.  A mild laxative (Milk of Magnesia or Miralax) should be taken according to package directions if there are no bowel movements after 48 hours. °7. Unless discharge instructions indicate otherwise, you may remove your bandages 24-48 hours after surgery, and you may shower at that time.  You may have steri-strips (small skin tapes) in place directly over the incision.  These strips should be left on the skin for 7-10 days.  If your surgeon used skin glue on the  incision, you may shower in 24 hours.  The glue will flake off over the next 2-3 weeks.  Any sutures or staples will be removed at the office during your follow-up visit. °8. ACTIVITIES:  You may resume regular (light) daily activities beginning the next day--such as daily self-care, walking, climbing stairs--gradually increasing activities as tolerated.  You may have sexual intercourse when it is comfortable.  Refrain from any heavy lifting or straining until approved by your doctor. ° °a.You may drive when you are no longer taking prescription pain medication, you can comfortably wear a seatbelt, and you can safely maneuver your car and apply brakes. °b.RETURN TO WORK:   °_____________________________________________ ° °9.You should see your doctor in the office for a follow-up appointment approximately 2-3 weeks after your surgery.  Make sure that you call for this appointment within a day or two after you arrive home to insure a convenient appointment time. °10.OTHER INSTRUCTIONS: _OK TO SHOWER °NO LIFTING MORE THAN 15 POUNDS FOR 4 WEEKS________________________ °   _____________________________________ ° °WHEN TO CALL YOUR DOCTOR: °1. Fever over 101.0 °2. Inability to urinate °3. Nausea and/or vomiting °4. Extreme swelling or bruising °5. Continued bleeding from incision. °6. Increased pain, redness, or drainage from the incision ° °The clinic staff is available to answer your questions during regular business hours.  Please don’t hesitate to call and ask to speak to one of the nurses for clinical concerns.  If you have a medical emergency, go to the nearest emergency room or call 911.  A surgeon from [facility] is always on call at the hospital   [address], [place], [place], [place]  [address] ?  [address], [place], [place]   [address] [phone] ? [phone] ? FAX [phone] Web site: [email or web address]

## 2018-09-28 NOTE — Anesthesia Procedure Notes (Signed)
Procedure Name: Intubation Date/Time: 09/28/2018 7:42 AM Performed by: Jenne Campus, CRNA Pre-anesthesia Checklist: Patient identified, Emergency Drugs available, Suction available and Patient being monitored Patient Re-evaluated:Patient Re-evaluated prior to induction Oxygen Delivery Method: Circle System Utilized Preoxygenation: Pre-oxygenation with 100% oxygen Induction Type: IV induction, Cricoid Pressure applied and Rapid sequence Laryngoscope Size: Miller and 2 Grade View: Grade I Tube type: Oral Tube size: 7.5 mm Number of attempts: 1 Airway Equipment and Method: Stylet and Oral airway Placement Confirmation: ETT inserted through vocal cords under direct vision,  positive ETCO2 and breath sounds checked- equal and bilateral Secured at: 21 cm Tube secured with: Tape Dental Injury: Teeth and Oropharynx as per pre-operative assessment

## 2018-09-28 NOTE — Discharge Summary (Signed)
Physician Discharge Summary  Patient ID: Miguel [name] MRN: [id number] DOB/AGE: [birth year] 68 y.o.  Admit date: 09/28/2018 Discharge date: 09/28/2018  Admission Diagnoses:  Discharge Diagnoses:  Active Problems:   Bilateral inguinal hernia   Discharged Condition: good  Hospital Course: did well.  Discharged day of surgery  Consults: None  Significant Diagnostic Studies: labs:   Treatments: surgery: bilateral inguinal hernia repair with mesh  Discharge Exam: Blood pressure 134/87, pulse 73, temperature 98.2 F (36.8 C), temperature source Oral, resp. rate 15, height 5\' 5"  (1.651 m), weight 60.4 kg, SpO2 100 %. Incision/Wound:healing well Awake and alert No pain  Disposition: Discharge disposition: 01-Home or Self Care        Allergies as of 09/28/2018   No Known Allergies     Medication List    TAKE these medications   acetaminophen 500 MG tablet Commonly known as: TYLENOL Take 1 tablet (500 mg total) by mouth every 8 (eight) hours as needed for mild pain (for pain). What changed: reasons to take this   diclofenac sodium 1 % Gel Commonly known as: VOLTAREN Apply 2 g topically 3 (three) times daily as needed (joint pain).   famotidine 20 MG tablet Commonly known as: PEPCID Take 20 mg by mouth 3 (three) times daily.   Fish Oil 1000 MG Caps Take 1,000 mg by mouth daily.   LORazepam 1 MG tablet Commonly known as: ATIVAN Take 1 mg by mouth 2 (two) times daily as needed for anxiety.   methocarbamol 500 MG tablet Commonly known as: ROBAXIN Take 500 mg by mouth every 8 (eight) hours as needed for muscle spasms.   multivitamin with minerals Tabs tablet Take 1 tablet by mouth daily.   ondansetron 4 MG disintegrating tablet Commonly known as: ZOFRAN-ODT Take 4 mg by mouth every 6 (six) hours as needed for nausea/vomiting.   oxyCODONE 5 MG immediate release tablet Commonly known as: Oxy IR/ROXICODONE Take 1 tablet (5 mg total) by mouth every 6 (six) hours  as needed for moderate pain, severe pain or breakthrough pain. What changed:   reasons to take this  Another medication with the same name was removed. Continue taking this medication, and follow the directions you see here.   pantoprazole 40 MG tablet Commonly known as: PROTONIX Take 40 mg by mouth 2 (two) times daily.   PROBIOTIC DAILY PO Take 1 capsule by mouth at bedtime.   sodium chloride flush 0.9 % Soln Commonly known as: NS 10 mLs by Other route daily.   tamsulosin 0.4 MG Caps capsule Commonly known as: FLOMAX Take 1 capsule (0.4 mg total) by mouth daily.   traMADol 50 MG tablet Commonly known as: ULTRAM Take 50 mg by mouth 3 (three) times daily as needed for moderate pain.   vitamin B-12 1000 MCG tablet Commonly known as: CYANOCOBALAMIN Take 1,000 mcg by mouth daily.   zolpidem 10 MG tablet Commonly known as: AMBIEN Take 5-10 mg by mouth at bedtime.      Follow-up Information    [name], MD. Schedule an appointment as soon as possible for a visit in 3 week(s).   Specialty: General Surgery Contact information: [address] [place] [address] [phone]           Signed: [name] 09/28/2018, 12:08 PM

## 2018-09-28 NOTE — Anesthesia Procedure Notes (Signed)
Anesthesia Regional Block: TAP block   Pre-Anesthetic Checklist: ,, timeout performed, Correct Patient, Correct Site, Correct Laterality, Correct Procedure, Correct Position, site marked, Risks and benefits discussed,  Surgical consent,  Pre-op evaluation,  At surgeon's request and post-op pain management  Laterality: Left and Right  Prep: chloraprep, alcohol swabs       Needles:  Injection technique: Single-shot  Needle Type: Echogenic Stimulator Needle     Needle Length: 9cm  Needle Gauge: 21     Additional Needles:   Narrative:  Start time: 09/28/2018 7:15 AM End time: 09/28/2018 7:25 AM Injection made incrementally with aspirations every 5 mL.  Performed by: Personally  Anesthesiologist: Lillia Abed, MD  Additional Notes: Monitors applied. Patient sedated. On both sides, sterile prep and drape,hand hygiene and sterile gloves were used. Relevant anatomy identified.Needle position confirmed.Local anesthetic injected incrementally after negative aspiration. Local anesthetic spread visualized in Transversus Abdominus Plane. Vascular puncture avoided. No complications. Images printed for medical record.The patient tolerated the procedure well.

## 2018-09-28 NOTE — Interval H&P Note (Signed)
History and Physical Interval Note:no change in H and P  09/28/2018 7:03 AM  Miguel [name]  has presented today for surgery, with the diagnosis of INGUINAL HERNIA.  The various methods of treatment have been discussed with the patient and family. After consideration of risks, benefits and other options for treatment, the patient has consented to  Procedure(s) with comments: BILATERAL OPEN INGUINAL HERNIA REPAIR WITH MESH (Bilateral) - GENERAL AND TAP BLOCK as a surgical intervention.  The patient's history has been reviewed, patient examined, no change in status, stable for surgery.  I have reviewed the patient's chart and labs.  Questions were answered to the patient's satisfaction.     [name]

## 2018-10-01 ENCOUNTER — Encounter (HOSPITAL_COMMUNITY): Payer: Self-pay | Admitting: Surgery

## 2018-10-16 ENCOUNTER — Other Ambulatory Visit: Payer: Self-pay

## 2018-10-16 ENCOUNTER — Other Ambulatory Visit (HOSPITAL_COMMUNITY): Payer: Self-pay | Admitting: Interventional Radiology

## 2018-10-16 ENCOUNTER — Ambulatory Visit (HOSPITAL_COMMUNITY)
Admission: RE | Admit: 2018-10-16 | Discharge: 2018-10-16 | Disposition: A | Discharge status: Home / Self Care | Payer: Medicare Other | Source: Ambulatory Visit | Attending: Interventional Radiology | Admitting: Interventional Radiology

## 2018-10-16 ENCOUNTER — Encounter (HOSPITAL_COMMUNITY): Payer: Self-pay | Admitting: Radiology

## 2018-10-16 DIAGNOSIS — K9423 Gastrostomy malfunction: Secondary | ICD-10-CM | POA: Diagnosis not present

## 2018-10-16 DIAGNOSIS — T85528A Displacement of other gastrointestinal prosthetic devices, implants and grafts, initial encounter: Secondary | ICD-10-CM

## 2018-10-16 HISTORY — PX: IR REPLACE G-TUBE SIMPLE WO FLUORO: IMG2323

## 2018-10-16 NOTE — Procedures (Signed)
Patient's 70 French balloon retention gastrostomy tube was exchanged out for new 42 French balloon retention tube secondary to noted balloon rupture; 8cc sterile saline placed into  balloon.  Tube secured to skin site.  Flushed without difficulty.  Okay to use.

## 2018-10-31 DIAGNOSIS — K5901 Slow transit constipation: Secondary | ICD-10-CM | POA: Diagnosis not present

## 2018-10-31 DIAGNOSIS — R935 Abnormal findings on diagnostic imaging of other abdominal regions, including retroperitoneum: Secondary | ICD-10-CM | POA: Diagnosis not present

## 2018-10-31 DIAGNOSIS — K439 Ventral hernia without obstruction or gangrene: Secondary | ICD-10-CM | POA: Diagnosis not present

## 2018-10-31 DIAGNOSIS — R633 Feeding difficulties: Secondary | ICD-10-CM | POA: Diagnosis not present

## 2019-01-20 DIAGNOSIS — Z23 Encounter for immunization: Secondary | ICD-10-CM | POA: Diagnosis not present

## 2019-01-24 DIAGNOSIS — G8929 Other chronic pain: Secondary | ICD-10-CM | POA: Diagnosis not present

## 2019-01-24 DIAGNOSIS — R1084 Generalized abdominal pain: Secondary | ICD-10-CM | POA: Diagnosis not present

## 2019-01-26 DIAGNOSIS — K219 Gastro-esophageal reflux disease without esophagitis: Secondary | ICD-10-CM | POA: Diagnosis not present

## 2019-01-26 DIAGNOSIS — R509 Fever, unspecified: Secondary | ICD-10-CM | POA: Diagnosis not present

## 2019-01-26 DIAGNOSIS — R633 Feeding difficulties: Secondary | ICD-10-CM | POA: Diagnosis not present

## 2019-02-02 DIAGNOSIS — R509 Fever, unspecified: Secondary | ICD-10-CM | POA: Diagnosis not present

## 2019-02-09 DIAGNOSIS — Z08 Encounter for follow-up examination after completed treatment for malignant neoplasm: Secondary | ICD-10-CM | POA: Diagnosis not present

## 2019-02-09 DIAGNOSIS — Z85820 Personal history of malignant melanoma of skin: Secondary | ICD-10-CM | POA: Diagnosis not present

## 2019-02-09 DIAGNOSIS — D485 Neoplasm of uncertain behavior of skin: Secondary | ICD-10-CM | POA: Diagnosis not present

## 2019-02-09 DIAGNOSIS — Z1283 Encounter for screening for malignant neoplasm of skin: Secondary | ICD-10-CM | POA: Diagnosis not present

## 2019-02-09 DIAGNOSIS — D225 Melanocytic nevi of trunk: Secondary | ICD-10-CM | POA: Diagnosis not present

## 2019-02-13 DIAGNOSIS — R972 Elevated prostate specific antigen [PSA]: Secondary | ICD-10-CM | POA: Diagnosis not present

## 2019-02-20 DIAGNOSIS — D485 Neoplasm of uncertain behavior of skin: Secondary | ICD-10-CM | POA: Diagnosis not present

## 2019-02-26 DIAGNOSIS — G8929 Other chronic pain: Secondary | ICD-10-CM | POA: Diagnosis not present

## 2019-02-26 DIAGNOSIS — R1084 Generalized abdominal pain: Secondary | ICD-10-CM | POA: Diagnosis not present

## 2019-03-05 ENCOUNTER — Other Ambulatory Visit (HOSPITAL_COMMUNITY): Payer: Self-pay | Admitting: Physician Assistant

## 2019-03-05 ENCOUNTER — Other Ambulatory Visit: Payer: Self-pay

## 2019-03-05 ENCOUNTER — Ambulatory Visit (HOSPITAL_COMMUNITY)
Admission: RE | Admit: 2019-03-05 | Discharge: 2019-03-05 | Disposition: A | Discharge status: Home / Self Care | Payer: Medicare Other | Source: Ambulatory Visit | Attending: Physician Assistant | Admitting: Physician Assistant

## 2019-03-05 ENCOUNTER — Encounter (HOSPITAL_COMMUNITY): Payer: Self-pay | Admitting: Interventional Radiology

## 2019-03-05 DIAGNOSIS — Z431 Encounter for attention to gastrostomy: Secondary | ICD-10-CM | POA: Diagnosis not present

## 2019-03-05 DIAGNOSIS — Z931 Gastrostomy status: Secondary | ICD-10-CM

## 2019-03-05 DIAGNOSIS — K9423 Gastrostomy malfunction: Secondary | ICD-10-CM | POA: Diagnosis not present

## 2019-03-05 HISTORY — PX: IR REPLACE G-TUBE SIMPLE WO FLUORO: IMG2323

## 2019-03-05 MED ORDER — IOHEXOL 300 MG/ML  SOLN
50.0000 mL | Freq: Once | INTRAMUSCULAR | Status: AC | PRN
  Administered 2019-03-05: 20 mL

## 2019-03-05 NOTE — Procedures (Signed)
Pre procedural Dx: Dysphagia, poorly functioning feeding tube. Post procedural Dx: Same  Successful fluoroscopic guided replacement of exisitng 22 Fr gastrostomy tube.   The feeding tube is ready for immediate use.  EBL: None  Complications: None immediate.  Jay Leilany Digeronimo, MD Pager #: [phone]  

## 2019-03-07 DIAGNOSIS — R633 Feeding difficulties: Secondary | ICD-10-CM | POA: Diagnosis not present

## 2019-03-29 DIAGNOSIS — Z20828 Contact with and (suspected) exposure to other viral communicable diseases: Secondary | ICD-10-CM | POA: Diagnosis not present

## 2019-05-01 DIAGNOSIS — R1084 Generalized abdominal pain: Secondary | ICD-10-CM | POA: Diagnosis not present

## 2019-05-01 DIAGNOSIS — G8929 Other chronic pain: Secondary | ICD-10-CM | POA: Diagnosis not present

## 2019-05-10 ENCOUNTER — Ambulatory Visit: Payer: Medicare Other

## 2019-06-27 ENCOUNTER — Other Ambulatory Visit (HOSPITAL_COMMUNITY): Payer: Self-pay | Admitting: Radiology

## 2019-06-27 DIAGNOSIS — R633 Feeding difficulties, unspecified: Secondary | ICD-10-CM

## 2019-06-28 ENCOUNTER — Ambulatory Visit (HOSPITAL_COMMUNITY)
Admission: RE | Admit: 2019-06-28 | Discharge: 2019-06-28 | Disposition: A | Discharge status: Home / Self Care | Payer: Medicare Other | Source: Ambulatory Visit | Attending: Radiology | Admitting: Radiology

## 2019-06-28 ENCOUNTER — Other Ambulatory Visit: Payer: Self-pay

## 2019-06-28 ENCOUNTER — Encounter: Payer: Self-pay | Admitting: Student

## 2019-06-28 ENCOUNTER — Other Ambulatory Visit (HOSPITAL_COMMUNITY): Payer: Self-pay | Admitting: Radiology

## 2019-06-28 DIAGNOSIS — Z431 Encounter for attention to gastrostomy: Secondary | ICD-10-CM | POA: Insufficient documentation

## 2019-06-28 DIAGNOSIS — R633 Feeding difficulties, unspecified: Secondary | ICD-10-CM

## 2019-06-28 HISTORY — PX: IR PATIENT EVAL TECH 0-60 MINS: IMG5564

## 2019-06-28 NOTE — Procedures (Signed)
Patient came in with complaint of smell from g tube insertion site and distal left quadrant pain.  The g tube was evaluated.  The site was clean with no evidence of skin breakdown or drainage.  There was not currently a smell.  There was some debris stuck to the inside wall of the tubing.  I successfully cleared this will a declogger and manual compression in case that was contributing to the smell he noticed previously.  Ally Louk PAC, evaluated the site and agreed that it look well  maintained and without noticeable issues.  The patient explained the pain he was experiencing was about 3-4 inches below the site.  He was concerned that the tube was causing the pain.  It was explained that the g tube did not extend to that area.  He was advised to follow up with his gi doctor or surgeon if the pain persists.

## 2019-07-09 DIAGNOSIS — G8929 Other chronic pain: Secondary | ICD-10-CM | POA: Diagnosis not present

## 2019-07-09 DIAGNOSIS — R1084 Generalized abdominal pain: Secondary | ICD-10-CM | POA: Diagnosis not present

## 2019-08-02 DIAGNOSIS — H6121 Impacted cerumen, right ear: Secondary | ICD-10-CM | POA: Diagnosis not present

## 2019-08-10 DIAGNOSIS — R1084 Generalized abdominal pain: Secondary | ICD-10-CM | POA: Diagnosis not present

## 2019-08-10 DIAGNOSIS — G8929 Other chronic pain: Secondary | ICD-10-CM | POA: Diagnosis not present

## 2019-08-22 ENCOUNTER — Ambulatory Visit (HOSPITAL_COMMUNITY)
Admission: RE | Admit: 2019-08-22 | Discharge: 2019-08-22 | Disposition: A | Discharge status: Home / Self Care | Payer: Medicare Other | Source: Ambulatory Visit | Attending: Radiology | Admitting: Radiology

## 2019-08-22 ENCOUNTER — Other Ambulatory Visit: Payer: Self-pay

## 2019-08-22 ENCOUNTER — Other Ambulatory Visit (HOSPITAL_COMMUNITY): Payer: Self-pay | Admitting: Radiology

## 2019-08-22 DIAGNOSIS — R633 Feeding difficulties, unspecified: Secondary | ICD-10-CM

## 2019-08-22 DIAGNOSIS — Z431 Encounter for attention to gastrostomy: Secondary | ICD-10-CM | POA: Insufficient documentation

## 2019-08-22 HISTORY — PX: IR REPLACE G-TUBE SIMPLE WO FLUORO: IMG2323

## 2019-08-22 MED ORDER — IOHEXOL 300 MG/ML  SOLN
50.0000 mL | Freq: Once | INTRAMUSCULAR | Status: AC | PRN
  Administered 2019-08-22: 10 mL

## 2019-08-22 NOTE — Procedures (Signed)
Pre procedural Dx: Dysphagia, poorly functioning feeding tube. Post procedural Dx: Same  Successful fluoroscopic guided replacement of exisitng 22 Fr gastrostomy tube.   The feeding tube is ready for immediate use.  EBL: None  Complications: None immediate.  Jay Lanisha Stepanian, MD Pager #: [phone]  

## 2019-09-07 DIAGNOSIS — G8929 Other chronic pain: Secondary | ICD-10-CM | POA: Diagnosis not present

## 2019-09-07 DIAGNOSIS — R1084 Generalized abdominal pain: Secondary | ICD-10-CM | POA: Diagnosis not present

## 2019-09-18 ENCOUNTER — Ambulatory Visit (INDEPENDENT_AMBULATORY_CARE_PROVIDER_SITE_OTHER): Payer: Medicare Other | Admitting: Orthopaedic Surgery

## 2019-09-18 ENCOUNTER — Other Ambulatory Visit: Payer: Self-pay

## 2019-09-18 ENCOUNTER — Ambulatory Visit (INDEPENDENT_AMBULATORY_CARE_PROVIDER_SITE_OTHER): Payer: Medicare Other

## 2019-09-18 ENCOUNTER — Encounter: Payer: Self-pay | Admitting: Orthopaedic Surgery

## 2019-09-18 DIAGNOSIS — M542 Cervicalgia: Secondary | ICD-10-CM

## 2019-09-18 DIAGNOSIS — M65321 Trigger finger, right index finger: Secondary | ICD-10-CM | POA: Diagnosis not present

## 2019-09-18 DIAGNOSIS — M65341 Trigger finger, right ring finger: Secondary | ICD-10-CM | POA: Diagnosis not present

## 2019-09-18 MED ORDER — LIDOCAINE HCL 1 % IJ SOLN
0.5000 mL | INTRAMUSCULAR | Status: AC | PRN
  Administered 2019-09-18: .5 mL

## 2019-09-18 MED ORDER — METHYLPREDNISOLONE ACETATE 40 MG/ML IJ SUSP
20.0000 mg | INTRAMUSCULAR | Status: AC | PRN
  Administered 2019-09-18: 20 mg

## 2019-09-18 NOTE — Progress Notes (Signed)
Office Visit Note   Patient: Miguel [name]           Date of Birth: 1950-11-03           MRN: [id number] Visit Date: 09/18/2019              Requested by: Alroy Dust, L.Marlou Sa, [place] [address] [place],  [place] [address] PCP: Alroy Dust, L.Marlou Sa, MD   Assessment & Plan: Visit Diagnoses:  1. Neck pain   2. Trigger finger, right ring finger   3. Trigger index finger of right hand     Plan: He is given cervical spine exercise handout is reviewed with him today.  He will continue applying moist heat to the neck. In regards to his nausea and dizziness which has been ongoing for a month recommend he see his primary care doctor for further work-up of this.  He does report that he had some recent blood work with his Education officer, environmental but does not know the results of this.   He did want trigger finger injections for the right hand index and ring finger today so we provided these.  He will follow up with [place] as needed.  Follow-Up Instructions: Return if symptoms worsen or fail to improve.   Orders:  Orders Placed This Encounter  Procedures  . XR Cervical Spine 2 or 3 views   No orders of the defined types were placed in this encounter.     Procedures: Hand/UE Inj: R ring A1 for trigger finger on 09/18/2019 9:44 AM Indications: pain and therapeutic Details: 25 G needle, volar approach Medications: 0.5 mL lidocaine 1 %; 20 mg methylPREDNISolone acetate 40 MG/ML Consent was given by the patient. Immediately prior to procedure a time out was called to verify the correct patient, procedure, equipment, support staff and site/side marked as required. Patient was prepped and draped in the usual sterile fashion.   Hand/UE Inj: R index A1 for trigger finger on 09/18/2019 9:45 AM Indications: pain and therapeutic Details: 25 G needle, volar approach Medications: 0.5 mL lidocaine 1 %; 20 mg methylPREDNISolone acetate 40 MG/ML Consent was given by the patient. Immediately prior to procedure  a time out was called to verify the correct patient, procedure, equipment, support staff and site/side marked as required. Patient was prepped and draped in the usual sterile fashion.       Clinical Data: No additional findings.   Subjective: Chief Complaint  Patient presents with  . Neck - Pain    HPI Miguel [name] is well-known to Dr. Ninfa Linden service comes in today due to neck pain for a month.  States neck pain is getting worse.  The pain is on the lateral aspect of the left side of the cervical spine.  He states is just not getting any better.  It is better with showers.  He also he feels Voltaren gel has helped.  Said no known injury.  Also having some nausea and dizziness for the last month he is unsure if these to be related.  He denies any radicular symptoms down either arm.  He does not have any room spinning or history of vertigo. Patient also has active trigger fingers on the right hand involving the index and ring finger which were injected back last June and did well until recently.  Review of Systems No fevers or chills.  Objective: Vital Signs: There were no vitals taken for this visit.  Physical Exam Constitutional:      Appearance: He is  not ill-appearing or diaphoretic.  Cardiovascular:     Pulses: Normal pulses.  Pulmonary:     Effort: Pulmonary effort is normal.  Neurological:     Mental Status: He is alert and oriented to person, place, and time.  Psychiatric:        Mood and Affect: Mood normal.     Ortho Exam Cervical spine: Full flexion and extension.  Discomfort in the normal rotation left right.  Spurling's is negative.  Nontender over the cervical spinal column.  No tenderness over the medial border of the scapula bilaterally.  Upper extremity strength testing 5 out of 5 throughout against resistance.  Sensation intact bilateral hands to light touch.  Full motor bilateral hands. Hands no rashes healing ulcerations.  Radial pulses are 2+ and equal  and symmetric.  Palpable nodules of the A1 pulley of the index finger and the ring finger right hand.  No active triggering.  Specialty Comments:  No specialty comments available.  Imaging: XR Cervical Spine 2 or 3 views  Result Date: 09/18/2019 Cervical spine AP lateral views: No acute findings noted acute fractures.  Overall normal cervical alignment on the lateral view.  Disc space well-maintained.  Anterior endplate spurring at D3-5 and C5-6 with almost complete bridging at C5-C6.  No spondylolisthesis.    PMFS History: Patient Active Problem List   Diagnosis Date Noted  . Bilateral inguinal hernia 09/28/2018  . Incisional hernia 03/17/2018  . Trigger thumb, left thumb 01/19/2018  . Trigger finger, left index finger 07/18/2017  . Trigger finger, right ring finger 01/31/2017  . Trigger finger of left thumb 01/03/2017  . Trigger finger of right thumb 01/03/2017  . Trigger index finger of left hand 01/03/2017  . Trigger index finger of right hand 01/03/2017  . Malnutrition of moderate degree 07/19/2016  . Intra-abdominal abscess ([place]) 07/16/2016  . Cellulitis 07/15/2016  . S/P colostomy takedown 06/28/2016  . Pain in thoracic spine 06/09/2016  . Mid back pain 06/09/2016  . Postoperative fever 11/19/2015  . 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 Severe protein-calorie malnutrition ([place]) 11/17/2015  . Sepsis ([place]) 11/14/2015  . Hiccups 11/14/2015  . AKI (acute kidney injury) ([place]) 11/14/2015  . Fever   . Leg swelling   . Left shoulder pain   . Muscle spasm of left shoulder   . GI bleed 10/27/2015  . Acute blood loss anemia 10/27/2015  . Syncope 10/27/2015  . Hyperglycemia 10/27/2015  . Hypotension 10/27/2015  . Neck pain 10/27/2015  . Hematemesis 10/27/2015  . Hematochezia 10/27/2015  . Diverticulitis of colon with perforation 07/05/2015   Past Medical History:  Diagnosis Date  . Anemia   . Anxiety   . BPH (benign prostatic hyperplasia)   . Gastritis   . GERD  (gastroesophageal reflux disease)    "seldom" (07/16/2016)  . GI bleed due to NSAIDs 10/27/2015  . History of blood transfusion 06/2016   post OR/notes 07/15/2016  . History of hiatal hernia   . History of kidney stones   . Hyperlipidemia   . Hypertension   . Melanoma of back ([place])    "mid back"  . Sigmoid diverticulitis    with perforation    Family History  Problem Relation Age of Onset  . Stroke Mother   . Stroke Brother   . Heart disease Brother     Past Surgical History:  Procedure Laterality Date  . COLON SURGERY     sigmoid  . COLOSTOMY TAKEDOWN N/A 06/28/2016   Procedure: COLOSTOMY TAKEDOWN;  Surgeon: [name], MD;  Location: [facility];  Service: General;  Laterality: N/A;  . ESOPHAGOGASTRODUODENOSCOPY N/A 10/27/2015   Procedure: ESOPHAGOGASTRODUODENOSCOPY (EGD);  Surgeon: Clarene Essex, MD;  [facility] END[facility]COPY;  Service: Endoscopy;  Laterality: N/A;  . ESOPHAGOGASTRODUODENOSCOPY N/A 10/29/2015   Procedure: ESOPHAGOGASTRODUODENOSCOPY (EGD);  Surgeon: Ronald Lobo, MD;  Location: [facility] ENDOSCOPY;  Service: Endoscopy;  Laterality: N/A;  . ESOPHAGOGASTRODUODENOSCOPY N/A 10/30/2015   Procedure: ESOPHAGOGASTRODUODENOSCOPY (EGD);  Surgeon: Clarene Essex, MD;  Location: [facility] ENDOSCOPY;  Service: Endoscopy;  Laterality: N/A;  . GASTROSTOMY TUBE PLACEMENT  11/21/2015   REDUCTION OF HIATAL HERNIA , REPAIR HIATAL HERNIA, RESECTION SMALL BOWEL WITH ANASTOMOSIS, PLACEMENT GASTROSTOMY TUBE, PLACEMENT DUODENOSTOMY TUBE (N/A)  . HEMORRHOID BANDING  X 2  . HERNIA REPAIR    . HIATAL HERNIA REPAIR N/A 11/21/2015   Procedure: REDUCTION OF HIATAL HERNIA , REPAIR HIATAL HERNIA, RESECTION SMALL BOWEL WITH ANASTOMOSIS, PLACEMENT GASTROSTOMY TUBE, PLACEMENT DUODENOSTOMY TUBE;  Surgeon: Mickeal Skinner, MD;  Location: [place];  Service: General;  Laterality: N/A;  . [place]  06/28/2016   open/notes 07/15/2016  . INCISIONAL HERNIA REPAIR  03/17/2018   WITH MESH  . INCISIONAL HERNIA  REPAIR N/A 03/17/2018   Procedure: INCISIONAL HERNIA REPAIR WITH MESH;  Surgeon: [name], MD;  Location: [place];  Service: General;  Laterality: N/A;  . INGUINAL HERNIA REPAIR Bilateral 09/28/2018  . INGUINAL HERNIA REPAIR Bilateral 09/28/2018   Procedure: BILATERAL OPEN INGUINAL HERNIA REPAIR WITH MESH;  Surgeon: [name], MD;  Location: [place];  Service: General;  Laterality: Bilateral;  GENERAL AND TAP BLOCK  . INSERTION OF MESH N/A 03/17/2018   Procedure: INSERTION OF MESH;  Surgeon: [name], MD;  Location: [place];  Service: General;  Laterality: N/A;  . INSERTION OF MESH Bilateral 09/28/2018   Procedure: Insertion Of Mesh;  Surgeon: [name], MD;  Location: [place];  Service: General;  Laterality: Bilateral;  . IR CM INJ ANY COLONIC TUBE W/FLUORO  02/04/2017  . IR GUIDED DRAIN W CATHETER PLACEMENT  07/06/2016   /NOTES 07/15/2016  . IR PATIENT EVAL TECH 0-60 MINS  06/28/2019  . IR RADIOLOGIST EVAL & MGMT  07/27/2016  . IR RADIOLOGIST EVAL & MGMT  08/17/2016  . IR RADIOLOGIST EVAL & MGMT  08/26/2016  . IR REPLACE G-TUBE SIMPLE WO FLUORO  07/28/2017  . IR REPLACE G-TUBE SIMPLE WO FLUORO  01/31/2018  . IR REPLACE G-TUBE SIMPLE WO FLUORO  10/16/2018  . IR REPLACE G-TUBE SIMPLE WO FLUORO  03/05/2019  . IR REPLACE G-TUBE SIMPLE WO FLUORO  08/22/2019  . IR [place] TUBE PERCUT W/FLUORO  08/04/2016  . IR [place] TUBE PERCUT W/FLUORO  01/14/2017  . IR US GUIDE BX ASP/DRAIN  08/04/2016  . KNEE CARTILAGE SURGERY Right 1971   "opened me up"  . LAPAROTOMY N/A 07/05/2015   Procedure: PARTIAL SIGMOID COLECTOMY AND COLOSTOMY;  Surgeon: [name], MD;  Location: [place] [place];  Service: General;  Laterality: N/A;  . MELANOMA EXCISION  2001  . 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 REPAIR OF PERFORATED ULCER N/A 10/30/2015   Procedure:  REPAIR OF BLEEDING  ULCER;  Surgeon: Judeth Horn, MD;  Location: [place];  Service: General;  Laterality: N/A;  . TUMOR EXCISION  2009   "back; fatty tumor"   Social History   Occupational History  .  Occupation: unable to work since April  Tobacco Use  . Smoking status: Never Smoker  . Smokeless tobacco: Former Systems developer    Types: Snuff  Vaping Use  . Vaping Use: Never used  Substance and Sexual Activity  . Alcohol use: No  . Drug use: No  . Sexual activity: Yes

## 2019-11-13 DIAGNOSIS — Z931 Gastrostomy status: Secondary | ICD-10-CM | POA: Diagnosis not present

## 2019-11-13 DIAGNOSIS — R1084 Generalized abdominal pain: Secondary | ICD-10-CM | POA: Diagnosis not present

## 2019-11-13 DIAGNOSIS — G8929 Other chronic pain: Secondary | ICD-10-CM | POA: Diagnosis not present

## 2019-12-07 DIAGNOSIS — G8929 Other chronic pain: Secondary | ICD-10-CM | POA: Diagnosis not present

## 2019-12-07 DIAGNOSIS — R1084 Generalized abdominal pain: Secondary | ICD-10-CM | POA: Diagnosis not present

## 2019-12-07 DIAGNOSIS — Z931 Gastrostomy status: Secondary | ICD-10-CM | POA: Diagnosis not present

## 2020-01-07 ENCOUNTER — Other Ambulatory Visit (HOSPITAL_COMMUNITY): Payer: Self-pay | Admitting: Radiology

## 2020-01-07 DIAGNOSIS — G8929 Other chronic pain: Secondary | ICD-10-CM | POA: Diagnosis not present

## 2020-01-07 DIAGNOSIS — R1084 Generalized abdominal pain: Secondary | ICD-10-CM | POA: Diagnosis not present

## 2020-01-07 DIAGNOSIS — R633 Feeding difficulties, unspecified: Secondary | ICD-10-CM

## 2020-01-07 DIAGNOSIS — Z931 Gastrostomy status: Secondary | ICD-10-CM | POA: Diagnosis not present

## 2020-01-08 ENCOUNTER — Ambulatory Visit (HOSPITAL_COMMUNITY)
Admission: RE | Admit: 2020-01-08 | Discharge: 2020-01-08 | Disposition: A | Discharge status: Home / Self Care | Payer: Medicare Other | Source: Ambulatory Visit | Attending: Radiology | Admitting: Radiology

## 2020-01-08 ENCOUNTER — Other Ambulatory Visit: Payer: Self-pay

## 2020-01-08 DIAGNOSIS — R633 Feeding difficulties, unspecified: Secondary | ICD-10-CM | POA: Diagnosis not present

## 2020-01-08 DIAGNOSIS — Z431 Encounter for attention to gastrostomy: Secondary | ICD-10-CM | POA: Diagnosis not present

## 2020-01-08 DIAGNOSIS — Z434 Encounter for attention to other artificial openings of digestive tract: Secondary | ICD-10-CM | POA: Diagnosis not present

## 2020-01-08 HISTORY — PX: IR REPLACE G-TUBE SIMPLE WO FLUORO: IMG2323

## 2020-01-08 MED ORDER — LIDOCAINE VISCOUS HCL 2 % MT SOLN
OROMUCOSAL | Status: AC
  Administered 2020-01-08: 1 mL
  Filled 2020-01-08: qty 15

## 2020-01-08 MED ORDER — IOHEXOL 300 MG/ML  SOLN
50.0000 mL | Freq: Once | INTRAMUSCULAR | Status: AC | PRN
  Administered 2020-01-08: 10 mL

## 2020-01-08 NOTE — Procedures (Signed)
Patient's existing 80 French balloon retention gastrostomy tube was exchanged out for new 45 French balloon retention gastrostomy tube without immediate complications.  Fluoroscopic spot image reveals appropriate placement in gastric lumen.  EBL none.

## 2020-01-16 DIAGNOSIS — Z23 Encounter for immunization: Secondary | ICD-10-CM | POA: Diagnosis not present

## 2020-01-24 DIAGNOSIS — R1084 Generalized abdominal pain: Secondary | ICD-10-CM | POA: Diagnosis not present

## 2020-01-24 DIAGNOSIS — N201 Calculus of ureter: Secondary | ICD-10-CM | POA: Diagnosis not present

## 2020-01-24 DIAGNOSIS — N2882 Megaloureter: Secondary | ICD-10-CM | POA: Diagnosis not present

## 2020-01-24 DIAGNOSIS — N1330 Unspecified hydronephrosis: Secondary | ICD-10-CM | POA: Diagnosis not present

## 2020-01-24 DIAGNOSIS — N200 Calculus of kidney: Secondary | ICD-10-CM | POA: Diagnosis not present

## 2020-02-05 ENCOUNTER — Other Ambulatory Visit: Payer: Self-pay | Admitting: Urology

## 2020-02-05 NOTE — Patient Instructions (Signed)
DUE TO COVID-19 ONLY ONE VISITOR IS ALLOWED TO COME WITH YOU AND STAY IN THE WAITING ROOM ONLY DURING PRE OP AND PROCEDURE DAY OF SURGERY. THE 1 VISITOR  MAY VISIT WITH YOU AFTER SURGERY IN YOUR PRIVATE ROOM DURING VISITING HOURS ONLY!  YOU NEED TO HAVE A COVID 19 TEST ON_11/12______ @_12 :05 pm______, THIS TEST MUST BE DONE BEFORE SURGERY,  COVID TESTING SITE [place] [id number], IT IS ON THE RIGHT GOING OUT [address] APPROXIMATELY  2 MINUTES PAST [employer] ON THE RIGHT. ONCE YOUR COVID TEST IS COMPLETED,  PLEASE BEGIN THE QUARANTINE INSTRUCTIONS AS OUTLINED IN YOUR HANDOUT.                Miguel [name]   Your procedure is scheduled on: 02/12/20   Report to [facility] Main  Entrance   Report to admitting at  11:30 AM     Call this number if you have problems the morning of surgery [phone]    Remember: Do not eat food or drink liquids :After Midnight.   BRUSH YOUR TEETH MORNING OF SURGERY AND RINSE YOUR MOUTH OUT, NO CHEWING GUM CANDY OR MINTS.     Take these medicines the morning of surgery with A SIP OF WATER: Pantoprazole, Tamsulosin, Pain med if needed                                 You may not have any metal on your body including              piercings  Do not wear jewelry,  lotions, powders or deodorant          .              Men may shave face and neck.   Do not bring valuables to the hospital. Miguel [name].  Contacts, dentures or bridgework may not be worn into surgery.      Patients discharged the day of surgery will not be allowed to drive home.   IF YOU ARE HAVING SURGERY AND GOING HOME THE SAME DAY, YOU MUST HAVE AN ADULT TO DRIVE YOU HOME AND BE WITH YOU FOR 24 HOURS.   YOU MAY GO HOME BY TAXI OR UBER OR ORTHERWISE, BUT AN ADULT MUST ACCOMPANY YOU HOME AND STAY WITH YOU FOR 24 HOURS.  Name and phone number of your driver:  Special Instructions: N/A               Please read over the following fact sheets you were given: _____________________________________________________________________             [name] - Preparing for Surgery  Before surgery, you can play an important role.   Because skin is not sterile, your skin needs to be as free of germs as possible.   You can reduce the number of germs on your skin by washing with CHG (chlorahexidine gluconate) soap before surgery.   CHG is an antiseptic cleaner which kills germs and bonds with the skin to continue killing germs even after washing. Please DO NOT use if you have an allergy to CHG or antibacterial soaps.   If your skin becomes reddened/irritated stop using the CHG and inform your nurse when you arrive at Short Stay.  You may shave  your face/neck.  Please follow these instructions carefully:  1.  Shower with CHG Soap the night before surgery and the  morning of Surgery.  2.  If you choose to wash your hair, wash your hair first as usual with your  normal  shampoo.  3.  After you shampoo, rinse your hair and body thoroughly to remove the  shampoo.                                        4.  Use CHG as you would any other liquid soap.  You can apply chg directly  to the skin and wash                       Gently with a scrungie or clean washcloth.  5.  Apply the CHG Soap to your body ONLY FROM THE NECK DOWN.   Do not use on face/ open                           Wound or open sores. Avoid contact with eyes, ears mouth and genitals (private parts).                       Wash face,  Genitals (private parts) with your normal soap.             6.  Wash thoroughly, paying special attention to the area where your surgery  will be performed.  7.  Thoroughly rinse your body with warm water from the neck down.  8.  DO NOT shower/wash with your normal soap after using and rinsing off  the CHG Soap.             9.  Pat yourself dry with a clean towel.            10.  Wear clean pajamas.             11.  Place clean sheets on your bed the night of your first shower and do not  sleep with pets. Day of Surgery : Do not apply any lotions/deodorants the morning of surgery.  Please wear clean clothes to the hospital/surgery center.  FAILURE TO FOLLOW THESE INSTRUCTIONS MAY RESULT IN THE CANCELLATION OF YOUR SURGERY PATIENT SIGNATURE_________________________________  NURSE SIGNATURE__________________________________  ________________________________________________________________________

## 2020-02-06 ENCOUNTER — Encounter (HOSPITAL_COMMUNITY): Payer: Self-pay

## 2020-02-06 ENCOUNTER — Other Ambulatory Visit: Payer: Self-pay

## 2020-02-06 ENCOUNTER — Encounter (HOSPITAL_COMMUNITY)
Admission: RE | Admit: 2020-02-06 | Discharge: 2020-02-06 | Disposition: A | Discharge status: Home / Self Care | Payer: Medicare Other | Source: Ambulatory Visit | Attending: Urology | Admitting: Urology

## 2020-02-06 DIAGNOSIS — R1032 Left lower quadrant pain: Secondary | ICD-10-CM | POA: Diagnosis not present

## 2020-02-06 DIAGNOSIS — Z01818 Encounter for other preprocedural examination: Secondary | ICD-10-CM | POA: Diagnosis not present

## 2020-02-06 DIAGNOSIS — R633 Feeding difficulties, unspecified: Secondary | ICD-10-CM | POA: Diagnosis not present

## 2020-02-06 DIAGNOSIS — I10 Essential (primary) hypertension: Secondary | ICD-10-CM | POA: Insufficient documentation

## 2020-02-06 DIAGNOSIS — K439 Ventral hernia without obstruction or gangrene: Secondary | ICD-10-CM | POA: Diagnosis not present

## 2020-02-06 DIAGNOSIS — Z1211 Encounter for screening for malignant neoplasm of colon: Secondary | ICD-10-CM | POA: Diagnosis not present

## 2020-02-06 LAB — CBC
HCT: 37.7 % — ABNORMAL LOW (ref 39.0–52.0)
Hemoglobin: 12.5 g/dL — ABNORMAL LOW (ref 13.0–17.0)
MCH: 32.8 pg (ref 26.0–34.0)
MCHC: 33.2 g/dL (ref 30.0–36.0)
MCV: 99 fL (ref 80.0–100.0)
Platelets: 196 10*3/uL (ref 150–400)
RBC: 3.81 MIL/uL — ABNORMAL LOW (ref 4.22–5.81)
RDW: 12.4 % (ref 11.5–15.5)
WBC: 4.7 10*3/uL (ref 4.0–10.5)
nRBC: 0 % (ref 0.0–0.2)

## 2020-02-06 LAB — BASIC METABOLIC PANEL
Anion gap: 8 (ref 5–15)
BUN: 10 mg/dL (ref 8–23)
CO2: 28 mmol/L (ref 22–32)
Calcium: 8.6 mg/dL — ABNORMAL LOW (ref 8.9–10.3)
Chloride: 102 mmol/L (ref 98–111)
Creatinine, Ser: 0.88 mg/dL (ref 0.61–1.24)
GFR, Estimated: >60 mL/min (ref >60)
Glucose, Bld: 92 mg/dL (ref 70–99)
Potassium: 4.6 mmol/L (ref 3.5–5.1)
Sodium: 138 mmol/L (ref 135–145)

## 2020-02-06 NOTE — Progress Notes (Signed)
Please place orders in epic. 

## 2020-02-07 NOTE — Progress Notes (Signed)
PCP - Charlie Pitter, MD Cardiologist - no  PPM/ICD -  Device Orders -  Rep Notified -   Chest x-ray -  EKG - 02-06-20 epic Stress Test -  ECHO -  Cardiac Cath -   Sleep Study -  CPAP -   Fasting Blood Sugar -  Checks Blood Sugar _____ times a day  Blood Thinner Instructions: Aspirin Instructions:  ERAS Protcol - PRE-SURGERY Ensure or G2-   COVID TEST- 11-12  Activity - Can walk a flight of stairs without sob Anesthesia review:   Patient denies shortness of breath, fever, cough and chest pain at PAT appointment  NONE   All instructions explained to the patient, with a verbal understanding of the material. Patient agrees to go over the instructions while at home for a better understanding. Patient also instructed to self quarantine after being tested for COVID-19. The opportunity to ask questions was provided.

## 2020-02-08 ENCOUNTER — Other Ambulatory Visit (HOSPITAL_COMMUNITY)
Admission: RE | Admit: 2020-02-08 | Discharge: 2020-02-08 | Disposition: A | Discharge status: Home / Self Care | Payer: Medicare Other | Source: Ambulatory Visit | Attending: Urology | Admitting: Urology

## 2020-02-08 DIAGNOSIS — G4700 Insomnia, unspecified: Secondary | ICD-10-CM | POA: Diagnosis not present

## 2020-02-08 DIAGNOSIS — E7800 Pure hypercholesterolemia, unspecified: Secondary | ICD-10-CM | POA: Diagnosis not present

## 2020-02-08 DIAGNOSIS — Z01812 Encounter for preprocedural laboratory examination: Secondary | ICD-10-CM | POA: Insufficient documentation

## 2020-02-08 DIAGNOSIS — R030 Elevated blood-pressure reading, without diagnosis of hypertension: Secondary | ICD-10-CM | POA: Diagnosis not present

## 2020-02-08 DIAGNOSIS — Z20822 Contact with and (suspected) exposure to covid-19: Secondary | ICD-10-CM | POA: Insufficient documentation

## 2020-02-08 DIAGNOSIS — R109 Unspecified abdominal pain: Secondary | ICD-10-CM | POA: Diagnosis not present

## 2020-02-08 DIAGNOSIS — K219 Gastro-esophageal reflux disease without esophagitis: Secondary | ICD-10-CM | POA: Diagnosis not present

## 2020-02-08 DIAGNOSIS — F419 Anxiety disorder, unspecified: Secondary | ICD-10-CM | POA: Diagnosis not present

## 2020-02-08 DIAGNOSIS — M5459 Other low back pain: Secondary | ICD-10-CM | POA: Diagnosis not present

## 2020-02-08 LAB — SARS CORONAVIRUS 2 (TAT 6-24 HRS): SARS Coronavirus 2: NEGATIVE

## 2020-02-11 DIAGNOSIS — G8929 Other chronic pain: Secondary | ICD-10-CM | POA: Diagnosis not present

## 2020-02-11 DIAGNOSIS — R1084 Generalized abdominal pain: Secondary | ICD-10-CM | POA: Diagnosis not present

## 2020-02-11 NOTE — H&P (Signed)
HPI: I have a history of kidney stones.  HPI: Miguel [name] is a 69 year-old male established patient who is here for F/U due to a history of renal calculi.   The patient was last seen 07/03/2018. The patient's stone was on his left side. He did not pass a stone since the last office visit.   The patient has not had any flank pain since they were last seen. The patient denies any progressive voiding symptoms. He has no seen blood in his urine since the last visit. He is currently having groin pain. He denies having flank pain, back pain, nausea, vomiting, fever, and chills. He has never had surgical treatment for calculi in the past. This condition would be considered of mild to moderate severity with no modifying factors or associated signs or symptoms other than as noted above.   08/15/18: He has a history of calculus disease with a known 3 mm stone in the lower pole of the left kidney last seen by ultrasound on 07/03/18. It could not be seen on KUB due to overlying bowel gas. He is not having any flank pain. It turns out he was having some discomfort in his lower abdomen and he has a large right inguinal hernia but also has been found to have a left inguinal hernia as well.     CC: Elevated PSA - Established Patient  HPI: His last PSA was performed 08/08/2018. The last PSA value was 3.51.   He has had a prostate biopsy. The patient states he does not take 5 alpha reductase inhibitor medication.   He has not recently had unwanted weight loss. He is not having new bone pain.   08/15/18: He had a transient, unexplained elevation of his PSA in 12/19-5.13. When it was rechecked in 3/20 it had returned to his baseline at 2.37. He remains free of any new voiding symptoms, bone pain or unexplained weight loss.  -01/24/20-patient with prior history of nephrolithiasis and BPH and elevated PSA in the past. Has had negative prostate biopsy in the past per above note. Presents today with complaint of left-sided  back pain which has been present for a couple of weeks. The patient has had history of bleeding: Requiring 5 different surgeries by his report he has a G-tube in place but is starting to eat more on his own. He does have some chronic nausea issues. Pain seems to be worse when he is in the standing position. Patient has never had any intervention for prior stones. Current pain somewhat relieved with Tylenol he states that he has had some nausea but no vomiting. Describes his pain at a 5/10 level but he is very calm and does not appear to be in any distress.  Micro urinalysis today is clear on urine spun sediment  Due to history of stones flank pain and nausea CT urogram was ordered. CT reviewed and by initial review shows a very small 2-3 mm nonobstructing left renal calculus in the lower calyx area. No evidence of hydronephrosis or obvious ureteral calculus noted on either side. Over read is pending  EXAM:  CT ABDOMEN AND PELVIS WITHOUT CONTRAST   TECHNIQUE:  Multidetector CT imaging of the abdomen and pelvis was performed  following the standard protocol without IV contrast.   COMPARISON: No priors.   FINDINGS:  Lower chest: Unremarkable.   Hepatobiliary: No definite suspicious cystic or solid hepatic  lesions are confidently identified on today's noncontrast CT  examination. Some dependent amorphous intermediate to high  attenuation material lies in the gallbladder, compatible with  biliary sludge and/or small noncalcified gallstones. Gallbladder  appears moderately distended, without definite gallbladder wall  thickening or pericholecystic fluid or inflammatory changes.   Pancreas: No definite pancreatic mass or peripancreatic fluid  collections or inflammatory changes are noted on today's noncontrast  CT examination.   Spleen: Unremarkable.   Adrenals/Urinary Tract: 4 mm nonobstructive calculus in the lower  pole collecting system of left kidney. In the right-side of the  pelvis  adjacent to the posterolateral aspect of the urinary bladder  there are several small calcifications which are clustered adjacent  to 1 another. One of these measures 3 mm (axial image 55 of series  2, and coronal image 82 of series 601), suspicious for a right  ureterovesicular stone. Immediately proximal to this the other  calcifications appear to reside within a focally dilated portion of  the distal right ureter, largest of which measures up to 7 mm.  Notably, there is no more proximal right hydroureteronephrosis. No  additional calculi are noted within the right renal collecting  system, or along the course of the left ureter. There does appear to  be potential focal dilatation of the distal third of the left ureter  is well immediately before the left ureterovesicular junction (axial  image 54 of series 2), without more proximal hydroureteronephrosis  on the left side. No calculi within the lumen of the urinary  bladder.   Stomach/Bowel: Postoperative changes in the stomach and bowel,  poorly characterized on today's noncontrast CT examination.  Percutaneous gastrostomy tube with retention balloon in the gastric  lumen. No pathologic dilatation of small bowel or colon. The  appendix is not confidently identified and may be surgically absent.  Regardless, there are no inflammatory changes noted adjacent to the  cecum to suggest the presence of an acute appendicitis at this time.   Vascular/Lymphatic: Aortic atherosclerosis. No lymphadenopathy noted  in the abdomen or pelvis.   Reproductive: Prostate gland is severely enlarged measuring 5.1 x  5.5 x 8.0 cm. Seminal vesicles are unremarkable in appearance.   Other: No significant volume of ascites. No pneumoperitoneum.   Musculoskeletal: There are no aggressive appearing lytic or blastic  lesions noted in the visualized portions of the skeleton.   IMPRESSION:  1. The distal third of both ureters immediately before the   ureterovesicular junctions appear capacious. Within the dilated  portion of the distal third of the right ureter there is a cluster  of multiple calculi measuring up to 7 mm. One of these measuring 3  mm is at the right ureterovesicular junction. Notably, at this time,  there is no more proximal right hydroureteronephrosis to indicate  definitive right-sided obstruction. Alternatively, the dilated  structures adjacent to the posterolateral aspect of the urinary  bladder bilaterally could represent periureteral bladder  diverticulae. These findings would be better evaluated with  follow-up nonemergent hematuria protocol CT scan in the near future  to more definitively assess the anatomy, and exclude the presence of  urothelial lesions within these dilated portions of the distal  ureters or bladder diverticulae.  2. There is also a 4 mm nonobstructive calculus in the lower pole  collecting system of left kidney.  3. Severe prostatomegaly.  4. Small amount of biliary sludge and/or small noncalcified  gallstones lying dependently in the gallbladder. No findings to  suggest an acute cholecystitis at this time.  5. Aortic atherosclerosis.       ALLERGIES: No Allergies  MEDICATIONS: Percocet 5 mg-325 mg tablet 1 tablet PO Q 6 H PRN  Tamsulosin Hcl 0.4 mg capsule  Famotidine  Lorazepam 1 mg tablet Oral  Multi-Day TABS Oral  Omega-3 CF CAPS Oral  Pantoprazole Sodium  Zofran 8 mg tablet 1 tablet PO BID PRN  Zolpidem Tartrate 10 mg tablet Oral     GU PSH: No GU PSH      PSH Notes: Partial Colectomy - 2017   NON-GU PSH: Colostomy Colostomy reversal - about 2018 Ulcer Surgery     GU PMH: BPH w/o LUTS (Stable), He has markedly enlarged prostate without significant voiding symptoms. - 08/15/2018 Flank Pain - 2020 LLQ pain - 2020 Scrotal pain - 3785 Renal colic - 88/50/2774, - 03/05/7866 Ureteral calculus (Improving), Left, It appears he has passed his left ureteral stone. -  02/14/2018, (Acute), Left, He has a 5 mm intramural ureter stone on the left-hand side. He has elected to proceed with medical expulsive therapy so I am going to increase his tamsulosin to 0.8 mg and give him pain medication. I told him I want to see him back in a week for a KUB. If he has not passed his stone by then we would need to discuss treatment., - 2019 Renal calculus, Left, His 3 mm left renal calculus is in the lower pole and is nonobstructing. - 2019, Nephrolithiasis, - 2014 Renal cyst, Right, His CT scan yesterday revealed a simple cyst of the right kidney. - 2019 Disorder of male genital organs, unspecified, Left, He has pain in the left testicle but has no abnormality on exam and no hernia in the inguinal region. He asked if it could be due to his abdominal hernia and I told him that I did not think that that was likely the case but the only way to know would be to see if it resolves with resolution of his abdominal hernia once that is repaired. I have reassured him that his urine is clear of any sign of infection and he has no abnormality of concern on examination. - 2019 Testicular atrophy, Left, I did find mild left atrophy. I do not think that is - 2019 BPH w/LUTS, Benign prostatic hyperplasia with urinary obstruction - 2014 ED due to arterial insufficiency, Erectile dysfunction due to arterial insufficiency - 2014 Elevated PSA, Elevated prostate specific antigen (PSA) - 2014      PMH Notes: History of nephrolithiasis: He has passed multiple stones in the past.  CT scan 8/17: 3 mm distal left ureteral stone and also a nonobstructing stone near the right UVJ within what was described by the radiologist as a "ureteral diverticulum". Nonobstructing bilateral renal calculi were also noted.  Stone: Calcium oxalate   Elevated PSA: he was found have an elevated PSA in 10/11 of 5.2.  TRUS/Bx 01/06/10: Prostate volume - 55 cc  Pathology: BPH with 2 cores containing chronic inflation.   BPH  with LUTS: He was originally diagnosed with this by Dr. Risa Grill and treated with Flomax which the patient felt actually worsened his symptoms. He underwent a trial of finasteride but stopped this due to decreased libido and erectile dysfunction.     NON-GU PMH: Personal history of other diseases of the digestive system, History of diverticulitis of colon - 2014 Personal history of other endocrine, nutritional and metabolic disease, History of hypercholesterolemia - 2014 Personal history of other specified conditions, History of insomnia - 2014 Encounter for general adult medical examination without abnormal findings, Encounter for preventive health examination  FAMILY HISTORY: Acute Myocardial Infarction - Father Blood In Urine - Father Family Health Status - Father alive at age 63 - 23 In Family Family Health Status - Mother's Age - Runs In Family Family Health Status Number - Runs In Family Heart Disease - Brother nephrolithiasis - Brother, Sister Prostate Cancer - Father Stroke Syndrome - Mother, Brother Tuberculosis - Mother   SOCIAL HISTORY: Marital Status: Married Current Smoking Status: Patient has never smoked.   Tobacco Use Assessment Completed: Used Tobacco in last 30 days? Has never drank.  Drinks 2 caffeinated drinks per day.     Notes: Never A Smoker, Marital History - Currently Married, Caffeine Use, Alcohol Use, Tobacco Use, Occupation:   REVIEW OF SYSTEMS:    GU Review Male:   Patient reports stream starts and stops. Patient denies frequent urination, hard to postpone urination, burning/ pain with urination, get up at night to urinate, leakage of urine, trouble starting your stream, have to strain to urinate , erection problems, and penile pain.  Gastrointestinal (Upper):   Patient reports nausea. Patient denies vomiting and indigestion/ heartburn.  Gastrointestinal (Lower):   Patient denies diarrhea and constipation.  Constitutional:   Patient denies fever, night  sweats, weight loss, and fatigue.  Skin:   Patient denies skin rash/ lesion and itching.  Eyes:   Patient denies blurred vision and double vision.  Ears/ Nose/ Throat:   Patient denies sore throat and sinus problems.  Hematologic/Lymphatic:   Patient denies swollen glands and easy bruising.  Cardiovascular:   Patient denies leg swelling and chest pains.  Respiratory:   Patient denies cough and shortness of breath.  Endocrine:   Patient denies excessive thirst.  Musculoskeletal:   Patient denies back pain and joint pain.  Neurological:   Patient denies headaches and dizziness.  Psychologic:   Patient denies depression and anxiety.   Notes: left flank pain, weak stream    VITAL SIGNS:      01/24/2020 03:36 PM  Weight 125 lb / 56.7 kg  Height 64 in / 162.56 cm  BP 168/99 mmHg  Pulse 78 /min  Temperature 97.7 F / 36.5 C  BMI 21.5 kg/m   GU PHYSICAL EXAMINATION:    Anus and Perineum: No hemorrhoids. No anal stenosis. No rectal fissure, no anal fissure. No edema, no dimple, no perineal tenderness, no anal tenderness.  Scrotum: No lesions. No edema. No cysts. No warts.  Epididymides: Right: no spermatocele, no masses, no cysts, no tenderness, no induration, no enlargement. Left: no spermatocele, no masses, no cysts, no tenderness, no induration, no enlargement.  Testes: No tenderness, no swelling, no enlargement left testes. No tenderness, no swelling, no enlargement right testes. Normal location left testes. Normal location right testes. No mass, no cyst, no varicocele, no hydrocele left testes. No mass, no cyst, no varicocele, no hydrocele right testes.  Urethral Meatus: Normal size. No lesion, no wart, no discharge, no polyp. Normal location.  Penis: Circumcised, no warts, no cracks. No dorsal Peyronie's plaques, no left corporal Peyronie's plaques, no right corporal Peyronie's plaques, no scarring, no warts. No balanitis, no meatal stenosis.  Prostate: 40 gram or 2+ size. Left lobe  normal consistency, right lobe normal consistency. Symmetrical lobes. No prostate nodule. Left lobe no tenderness, right lobe no tenderness.  Seminal Vesicles: Nonpalpable.  Sphincter Tone: Normal sphincter. No rectal tenderness. No rectal mass.    MULTI-SYSTEM PHYSICAL EXAMINATION:    Constitutional: Well-nourished. No physical deformities. Normally developed. Good grooming.  Neck: Neck symmetrical, not swollen.  Normal tracheal position.  Respiratory: No labored breathing, no use of accessory muscles.   Cardiovascular: Normal temperature, normal extremity pulses, no swelling, no varicosities.  Lymphatic: No enlargement of neck, axillae, groin.  Skin: No paleness, no jaundice, no cyanosis. No lesion, no ulcer, no rash.  Neurologic / Psychiatric: Oriented to time, oriented to place, oriented to person. No depression, no anxiety, no agitation.  Gastrointestinal: No mass, no tenderness, no rigidity, non obese abdomen.  Eyes: Normal conjunctivae. Normal eyelids.  Ears, Nose, Mouth, and Throat: Left ear no scars, no lesions, no masses. Right ear no scars, no lesions, no masses. Nose no scars, no lesions, no masses. Normal hearing. Normal lips.  Musculoskeletal: Normal gait and station of head and neck.     Complexity of Data:  Source Of History:  Patient  Records Review:   Previous Doctor Records, Previous Patient Records  Urine Test Review:   Urinalysis   02/14/19 08/08/18 06/07/18 03/14/18 03/14/17 04/21/12 03/09/11 07/16/10  PSA  Total PSA 6.31 ng/mL 3.51 ng/mL 2.37 ng/mL 5.13 ng/dl 2.87 ng/dl 2.98  2.54  3.89   Free PSA 1.21 ng/mL         % Free PSA 19 % PSA           04/21/12  Hormones  Testosterone, Total 308.84     PROCEDURES:         C.T. Urogram - [id number]      Patient confirmed No Neulasta OnPro Device.           Urinalysis Dipstick Dipstick Cont'd  Color: Yellow Bilirubin: Neg mg/dL  Appearance: Clear Ketones: Neg mg/dL  Specific Gravity: 1.015 Blood: Neg ery/uL  pH:  6.0 Protein: Neg mg/dL  Glucose: Neg mg/dL Urobilinogen: 0.2 mg/dL    Nitrites: Neg    Leukocyte Esterase: Neg leu/uL    ASSESSMENT:      ICD-10 Details  1 GU:   Renal calculus - N20.0 Chronic, Stable  2   Flank Pain - C37.62 Acute, Complicated Injury   PLAN:            Medications Stop Meds: Cialis 5 MG Oral Tablet 0 Oral  Start: 04/21/2012  Discontinue: 01/24/2020  - Reason: The medication cycle was completed.  Ibuprofen TABS Oral  Start: 07/23/2010  Discontinue: 01/24/2020  - Reason: The medication cycle was completed.  Oxycodone Hcl 10 mg tablet 1 tablet PO Q 6 H PRN  Start: 03/13/2018  Discontinue: 01/24/2020  - Reason: The medication cycle was completed.  Oxycodone Hcl 10 mg tablet 1 tablet PO Q 6 H PRN  Start: 03/13/2018  Discontinue: 01/24/2020  - Reason: The medication cycle was completed.            Orders X-Rays: C.T. Stone Protocol Without Contrast  X-Ray Notes: History:  Hematuria: Yes/No  Patient to see MD after exam: Yes/No  Previous exam: CT / IVP/ US/ KUB/ None  When:  Where:  Diabetic: Yes/ No  BUN/ Creatine:  Date of last BUN Creatinine:  Weight in pounds:  Allergy- Contrasts/ Shellfish: Yes/ No  Conflicting diabetic meds: Yes/ No  Oral contrast and instructions given to patient:   Prior Authorization #: [id number]            Schedule         Document Letter(s):  Created for Patient: Clinical Summary         Notes:   I reviewed CT scan with the patient. I reassured him I did not see any obvious stone as  to be the cause for his pain. Will let him know once we have the over read results. Will otherwise see him back as previous scheduled for follow-up. Will defer to his general surgeon or PCP if he has continued pain.  Addendum, CT showed Right ureteral calculi distally, doubt this is cause for pain. I have notified patient and will schedule for ureteroscopy and laser lithotripsy.  I have recommended retrograde pyelogram, ureteroscopic  stone manipulation with laser lithotripsy. I have discussed in detail the risks, benefits and alternatives of ureteroscopic stone extraction to include but not limited to: Bleeding, infection, ureteral perforation with need for open repair, inability to place the stent necessitating the need for further procedures, possible percutaneous nephrostomy tube placement, discomfort from the stents, hematuria, urgency, frequency and refractory problems after the stent is removed. I discussed the stent is not a permanent stent and will require a followup for stent removal or stent exchange. The patient knows there is high risk for ureteral stent incrustation if this is not removed or exchanged within 3 months. Patient voices understanding of the risks and benefits of the procedure and consents to the procedure.

## 2020-02-12 ENCOUNTER — Ambulatory Visit (HOSPITAL_COMMUNITY): Payer: Medicare Other | Admitting: Certified Registered Nurse Anesthetist

## 2020-02-12 ENCOUNTER — Encounter (HOSPITAL_COMMUNITY)
Admission: RE | Disposition: A | Discharge status: Home / Self Care | Payer: Self-pay | Source: Home / Self Care | Attending: Urology

## 2020-02-12 ENCOUNTER — Encounter (HOSPITAL_COMMUNITY): Payer: Self-pay | Admitting: Urology

## 2020-02-12 ENCOUNTER — Ambulatory Visit (HOSPITAL_COMMUNITY): Payer: Medicare Other

## 2020-02-12 ENCOUNTER — Other Ambulatory Visit: Payer: Self-pay

## 2020-02-12 ENCOUNTER — Ambulatory Visit (HOSPITAL_COMMUNITY)
Admission: RE | Admit: 2020-02-12 | Discharge: 2020-02-12 | Disposition: A | Discharge status: Home / Self Care | Payer: Medicare Other | Attending: Urology | Admitting: Urology

## 2020-02-12 DIAGNOSIS — K219 Gastro-esophageal reflux disease without esophagitis: Secondary | ICD-10-CM | POA: Diagnosis not present

## 2020-02-12 DIAGNOSIS — N202 Calculus of kidney with calculus of ureter: Secondary | ICD-10-CM | POA: Diagnosis not present

## 2020-02-12 DIAGNOSIS — Z841 Family history of disorders of kidney and ureter: Secondary | ICD-10-CM | POA: Diagnosis not present

## 2020-02-12 DIAGNOSIS — N210 Calculus in bladder: Secondary | ICD-10-CM | POA: Diagnosis not present

## 2020-02-12 DIAGNOSIS — Z931 Gastrostomy status: Secondary | ICD-10-CM | POA: Diagnosis not present

## 2020-02-12 DIAGNOSIS — R1084 Generalized abdominal pain: Secondary | ICD-10-CM | POA: Diagnosis not present

## 2020-02-12 DIAGNOSIS — I10 Essential (primary) hypertension: Secondary | ICD-10-CM | POA: Diagnosis not present

## 2020-02-12 DIAGNOSIS — N323 Diverticulum of bladder: Secondary | ICD-10-CM | POA: Insufficient documentation

## 2020-02-12 DIAGNOSIS — D649 Anemia, unspecified: Secondary | ICD-10-CM | POA: Diagnosis not present

## 2020-02-12 DIAGNOSIS — N201 Calculus of ureter: Secondary | ICD-10-CM | POA: Diagnosis not present

## 2020-02-12 HISTORY — PX: CYSTOSCOPY WITH RETROGRADE PYELOGRAM, URETEROSCOPY AND STENT PLACEMENT: SHX5789

## 2020-02-12 HISTORY — PX: HOLMIUM LASER APPLICATION: SHX5852

## 2020-02-12 SURGERY — CYSTOURETEROSCOPY, WITH RETROGRADE PYELOGRAM AND STENT INSERTION
Anesthesia: General | Laterality: Right

## 2020-02-12 MED ORDER — CHLORHEXIDINE GLUCONATE 0.12 % MT SOLN
15.0000 mL | Freq: Once | OROMUCOSAL | Status: AC
  Administered 2020-02-12: 15 mL via OROMUCOSAL

## 2020-02-12 MED ORDER — LIDOCAINE 2% (20 MG/ML) 5 ML SYRINGE
INTRAMUSCULAR | Status: AC
  Filled 2020-02-12: qty 5

## 2020-02-12 MED ORDER — LIDOCAINE 2% (20 MG/ML) 5 ML SYRINGE
INTRAMUSCULAR | Status: DC | PRN
  Administered 2020-02-12: 60 mg via INTRAVENOUS

## 2020-02-12 MED ORDER — ORAL CARE MOUTH RINSE: 15.0000 mL | Freq: Once | OROMUCOSAL | Status: AC

## 2020-02-12 MED ORDER — FENTANYL CITRATE (PF) 100 MCG/2ML IJ SOLN
INTRAMUSCULAR | Status: AC
  Filled 2020-02-12: qty 2

## 2020-02-12 MED ORDER — CEFAZOLIN SODIUM-DEXTROSE 2-4 GM/100ML-% IV SOLN
INTRAVENOUS | Status: AC
  Filled 2020-02-12: qty 100

## 2020-02-12 MED ORDER — IOHEXOL 300 MG/ML  SOLN
INTRAMUSCULAR | Status: DC | PRN
  Administered 2020-02-12: 22 mL via URETHRAL

## 2020-02-12 MED ORDER — PROPOFOL 10 MG/ML IV BOLUS
INTRAVENOUS | Status: DC | PRN
  Administered 2020-02-12: 150 mg via INTRAVENOUS

## 2020-02-12 MED ORDER — LACTATED RINGERS IV SOLN: INTRAVENOUS | Status: DC

## 2020-02-12 MED ORDER — SODIUM CHLORIDE 0.9 % IR SOLN
Status: DC | PRN
  Administered 2020-02-12 (×3): 3000 mL

## 2020-02-12 MED ORDER — DEXAMETHASONE SODIUM PHOSPHATE 10 MG/ML IJ SOLN
INTRAMUSCULAR | Status: DC | PRN
  Administered 2020-02-12: 4 mg via INTRAVENOUS

## 2020-02-12 MED ORDER — PROPOFOL 10 MG/ML IV BOLUS
INTRAVENOUS | Status: AC
  Filled 2020-02-12: qty 20

## 2020-02-12 MED ORDER — PHENYLEPHRINE 40 MCG/ML (10ML) SYRINGE FOR IV PUSH (FOR BLOOD PRESSURE SUPPORT)
PREFILLED_SYRINGE | INTRAVENOUS | Status: DC | PRN
  Administered 2020-02-12 (×2): 80 ug via INTRAVENOUS

## 2020-02-12 MED ORDER — ONDANSETRON HCL 4 MG/2ML IJ SOLN
INTRAMUSCULAR | Status: DC | PRN
  Administered 2020-02-12: 4 mg via INTRAVENOUS

## 2020-02-12 MED ORDER — FENTANYL CITRATE (PF) 100 MCG/2ML IJ SOLN
INTRAMUSCULAR | Status: DC | PRN
  Administered 2020-02-12 (×4): 25 ug via INTRAVENOUS

## 2020-02-12 MED ORDER — CEFAZOLIN SODIUM-DEXTROSE 2-4 GM/100ML-% IV SOLN
2.0000 g | Freq: Once | INTRAVENOUS | Status: AC
  Administered 2020-02-12: 2 g via INTRAVENOUS

## 2020-02-12 SURGICAL SUPPLY — 31 items
BAG DRN RND TRDRP ANRFLXCHMBR (UROLOGICAL SUPPLIES) ×2
BAG URINE DRAIN 2000ML AR STRL (UROLOGICAL SUPPLIES) ×2 IMPLANT
BAG URO CATCHER STRL LF (MISCELLANEOUS) ×4 IMPLANT
BASKET ZERO TIP NITINOL 2.4FR (BASKET) IMPLANT
BSKT STON RTRVL ZERO TP 2.4FR (BASKET)
BULB IRRIG PATHFIND (MISCELLANEOUS) ×6 IMPLANT
CATH FOLEY 2WAY SLVR  5CC 20FR (CATHETERS) ×4
CATH FOLEY 2WAY SLVR 5CC 20FR (CATHETERS) IMPLANT
CATH URET 5FR 28IN OPEN ENDED (CATHETERS) IMPLANT
CLOTH BEACON ORANGE TIMEOUT ST (SAFETY) ×4 IMPLANT
COVER SURGICAL LIGHT HANDLE (MISCELLANEOUS) ×4 IMPLANT
COVER WAND RF STERILE (DRAPES) IMPLANT
GLOVE BIOGEL M STRL SZ7.5 (GLOVE) ×4 IMPLANT
GOWN STRL REUS W/TWL XL LVL3 (GOWN DISPOSABLE) ×4 IMPLANT
GUIDEWIRE ANG ZIPWIRE 035X150 (WIRE) ×2 IMPLANT
GUIDEWIRE ANG ZIPWIRE 038X150 (WIRE) IMPLANT
GUIDEWIRE STR DUAL SENSOR (WIRE) ×4 IMPLANT
IV NS 1000ML (IV SOLUTION) ×4
IV NS 1000ML BAXH (IV SOLUTION) ×2 IMPLANT
KIT TURNOVER KIT A (KITS) IMPLANT
LASER FIB FLEXIVA PULSE ID 365 (Laser) ×2 IMPLANT
MANIFOLD NEPTUNE II (INSTRUMENTS) ×4 IMPLANT
PACK CYSTO (CUSTOM PROCEDURE TRAY) ×4 IMPLANT
PENCIL SMOKE EVACUATOR (MISCELLANEOUS) IMPLANT
SHEATH URETERAL 12FRX35CM (MISCELLANEOUS) IMPLANT
SYR 20ML LL LF (SYRINGE) ×4 IMPLANT
TRACTIP FLEXIVA PULS ID 200XHI (Laser) IMPLANT
TRACTIP FLEXIVA PULSE ID 200 (Laser)
TUBING CONNECTING 10 (TUBING) ×3 IMPLANT
TUBING CONNECTING 10' (TUBING) ×1
TUBING UROLOGY SET (TUBING) ×4 IMPLANT

## 2020-02-12 NOTE — Discharge Instructions (Signed)
Foley catheter to gravity drain to be discharged home overnight.  Patient to make follow-up appointment for tomorrow 02/13/2020 for Foley removal at the office

## 2020-02-12 NOTE — Anesthesia Procedure Notes (Signed)
Procedure Name: LMA Insertion Date/Time: 02/12/2020 1:08 PM Performed by: Montel Clock, CRNA Pre-anesthesia Checklist: Patient identified, Emergency Drugs available, Suction available, Patient being monitored and Timeout performed Patient Re-evaluated:Patient Re-evaluated prior to induction Oxygen Delivery Method: Circle system utilized Preoxygenation: Pre-oxygenation with 100% oxygen Induction Type: IV induction Ventilation: Mask ventilation without difficulty LMA: LMA with gastric port inserted LMA Size: 4.0 Number of attempts: 1 Dental Injury: Teeth and Oropharynx as per pre-operative assessment

## 2020-02-12 NOTE — Interval H&P Note (Signed)
History and Physical Interval Note:  02/12/2020 12:44 PM  Miguel [name]  has presented today for surgery, with the diagnosis of RIGHT URETERAL CALCULUS.  The various methods of treatment have been discussed with the patient and family. After consideration of risks, benefits and other options for treatment, the patient has consented to  Procedure(s) with comments: CYSTOSCOPY WITH RETROGRADE PYELOGRAM, URETEROSCOPY AND STENT PLACEMENT (Right) - 1 HR HOLMIUM LASER APPLICATION (Right) as a surgical intervention.  The patient's history has been reviewed, patient examined, no change in status, stable for surgery.  I have reviewed the patient's chart and labs.  Questions were answered to the patient's satisfaction.     Remi Haggard

## 2020-02-12 NOTE — Transfer of Care (Signed)
Immediate Anesthesia Transfer of Care Note  Patient: Miguel [name]  Procedure(s) Performed: CYSTOSCOPY WITH BILATERAL RETROGRADE PYELOGRAM,  AND LITHOPEXY (Bilateral ) HOLMIUM LASER APPLICATION (Right )  Patient Location: PACU  Anesthesia Type:General  Level of Consciousness: drowsy and patient cooperative  Airway & Oxygen Therapy: Patient Spontanous Breathing and Patient connected to face mask oxygen  Post-op Assessment: Report given to RN and Post -op Vital signs reviewed and stable  Post vital signs: Reviewed and stable  Last Vitals:  Vitals Value Taken Time  BP 158/97 02/12/20 1422  Temp 36.9 C 02/12/20 1422  Pulse 83 02/12/20 1426  Resp 15 02/12/20 1426  SpO2 100 % 02/12/20 1426  Vitals shown include unvalidated device data.  Last Pain:  Vitals:   02/12/20 1422  TempSrc:   PainSc: 0-No pain      Patients Stated Pain Goal: 4 (99/35/70 1779)  Complications: No complications documented.

## 2020-02-12 NOTE — Anesthesia Postprocedure Evaluation (Signed)
Anesthesia Post Note  Patient: Miguel [name]  Procedure(s) Performed: CYSTOSCOPY WITH BILATERAL RETROGRADE PYELOGRAM,  AND LITHOPEXY (Bilateral ) HOLMIUM LASER APPLICATION (Right )     Patient location during evaluation: PACU Anesthesia Type: General Level of consciousness: awake and alert Pain management: pain level controlled Vital Signs Assessment: post-procedure vital signs reviewed and stable Respiratory status: spontaneous breathing, nonlabored ventilation, respiratory function stable and patient connected to nasal cannula oxygen Cardiovascular status: blood pressure returned to baseline and stable Postop Assessment: no apparent nausea or vomiting Anesthetic complications: no   No complications documented.  Last Vitals:  Vitals:   02/12/20 1430 02/12/20 1445  BP: (!) 163/86 (!) 143/93  Pulse: 89 81  Resp: 15 16  Temp:    SpO2: 100% 99%    Last Pain:  Vitals:   02/12/20 1445  TempSrc:   PainSc: 0-No pain                 Margarita Bobrowski S

## 2020-02-12 NOTE — Anesthesia Preprocedure Evaluation (Signed)
Anesthesia Evaluation  Patient identified by MRN, date of birth, ID band Patient awake    Reviewed: Allergy & Precautions, NPO status , Patient's Chart, lab work & pertinent test results  Airway Mallampati: I  TM Distance: >3 FB Neck ROM: Full    Dental  (+) Teeth Intact, Dental Advisory Given   Pulmonary    Pulmonary exam normal        Cardiovascular hypertension, Pt. on medications Normal cardiovascular exam     Neuro/Psych Anxiety    GI/Hepatic Neg liver ROS, hiatal hernia, GERD  Medicated,  Endo/Other    Renal/GU Renal disease     Musculoskeletal   Abdominal   Peds  Hematology  (+) Blood dyscrasia, anemia ,   Anesthesia Other Findings   Reproductive/Obstetrics                             Anesthesia Physical  Anesthesia Plan  ASA: III  Anesthesia Plan: General   Post-op Pain Management:  Regional for Post-op pain   Induction: Intravenous  PONV Risk Score and Plan: 2 and Ondansetron and Dexamethasone  Airway Management Planned: Oral ETT and LMA  Additional Equipment:   Intra-op Plan:   Post-operative Plan: Extubation in OR  Informed Consent: I have reviewed the patients History and Physical, chart, labs and discussed the procedure including the risks, benefits and alternatives for the proposed anesthesia with the patient or authorized representative who has indicated his/her understanding and acceptance.       Plan Discussed with: CRNA and Surgeon  Anesthesia Plan Comments: (PAT note written 09/26/2018 by Myra Gianotti, PA-C. )        Anesthesia Quick Evaluation

## 2020-02-12 NOTE — Op Note (Signed)
Operative report  Preoperative diagnosis: Right distal ureteral calculi Postop diagnosis: No evidence of ureteral calculus.  Stones within [facility] bladder diverticulum Procedure: Cystoscopy, bilateral retrogrades with intraoperative interpretation, cystolitholapaxy of Hutch bladder diverticular stones utilizing holmium laser Surgeon: Milford Cage Anesthesia: General Estimated blood loss: Minimal Operative findings.  Patient has bilateral Hutch diverticuli.  Stones which were initially thought to be within the right distal ureter with within the [facility] diverticulum on the right side of the bladder adjacent to the ureteral orifice.  No evidence of filling defect within either ureter and each ureter emptied out promptly upon removal of the retrograde catheter.  Bladder stones were fragmented utilizing the holmium laser fiber and irrigated from the bladder.  25 French Foley placed due to oozing of the median lobe. Operative note: After obtaining form consent for the patient taken the major cystoscopy suite placed under general anesthesia.  He is placed in the dorsolithotomy position genitalia prepped and draped in usual sterile fashion.  Proper pause and timeout was performed for site of procedure.  The [address] [place] was advanced in the bladder.  The prostate was noted to be enlarged with trilobar prostatic hypertrophy and elevated median lobe.  The ureteral orifice was somewhat angulated due to the median lobe hypertrophy.  Patient was noted to have bilateral Hutch diverticuli.  I was able to pass the tip of the scope within the [facility] diverticulum on either side and there were several stones noted within the right sided Hutch diverticulum there was no evidence of stone in the left side.  I was able to cannulate the right ureteral orifice utilizing an angled Glidewire and open tip catheter.  Retrograde pyelogram was subsequently performed and revealed normal filling of the ureter without evidence of filling defect or  hydronephrosis.  Right ureter emptied out probably upon removal of the retrograde catheter.  In a similar fashion the left ureter was cannulated and retrograde performed revealing similar findings with no evidence of filling defect or hydronephrosis and emptied out promptly.  Attention was then directed towards removal of the bladder calculi.  The cystoscope was able to manipulate into the [place] Hutch diverticulum.  Utilizing the [facility] holmium laser on the dust setting I was able to fragment the stones into numerous small pieces which were then irrigated through the cystoscope into the bladder and then irrigated out.  There was significant oozing from median lobe just to the for the scope going over top of that and therefore a 14 [place] Foley was placed at termination of the case after final look revealed no stone fragments in either diverticulum or the bladder.  Procedure was terminated he was awakened from anesthesia and taken back to the recovery room stable condition no immediate complication from the procedure.

## 2020-02-13 ENCOUNTER — Encounter (HOSPITAL_COMMUNITY): Payer: Self-pay | Admitting: Urology

## 2020-02-13 DIAGNOSIS — N400 Enlarged prostate without lower urinary tract symptoms: Secondary | ICD-10-CM | POA: Diagnosis not present

## 2020-02-13 DIAGNOSIS — N210 Calculus in bladder: Secondary | ICD-10-CM | POA: Diagnosis not present

## 2020-02-13 DIAGNOSIS — N200 Calculus of kidney: Secondary | ICD-10-CM | POA: Diagnosis not present

## 2020-02-22 ENCOUNTER — Other Ambulatory Visit: Payer: Self-pay | Admitting: Urology

## 2020-02-22 MED ORDER — OXYCODONE HCL 5 MG PO TABS: 5.0000 mg | ORAL_TABLET | Freq: Four times a day (QID) | ORAL | 0 refills | Status: DC | PRN

## 2020-02-28 DIAGNOSIS — N400 Enlarged prostate without lower urinary tract symptoms: Secondary | ICD-10-CM | POA: Diagnosis not present

## 2020-02-28 DIAGNOSIS — N210 Calculus in bladder: Secondary | ICD-10-CM | POA: Diagnosis not present

## 2020-03-11 ENCOUNTER — Ambulatory Visit (INDEPENDENT_AMBULATORY_CARE_PROVIDER_SITE_OTHER): Payer: Medicare Other | Admitting: Orthopaedic Surgery

## 2020-03-11 ENCOUNTER — Encounter: Payer: Self-pay | Admitting: Orthopaedic Surgery

## 2020-03-11 ENCOUNTER — Other Ambulatory Visit: Payer: Self-pay

## 2020-03-11 ENCOUNTER — Ambulatory Visit (INDEPENDENT_AMBULATORY_CARE_PROVIDER_SITE_OTHER): Payer: Medicare Other

## 2020-03-11 DIAGNOSIS — M545 Low back pain, unspecified: Secondary | ICD-10-CM | POA: Diagnosis not present

## 2020-03-11 DIAGNOSIS — M542 Cervicalgia: Secondary | ICD-10-CM

## 2020-03-11 NOTE — Progress Notes (Signed)
Office Visit Note   Patient: Miguel [name]           Date of Birth: June 30, 1950           MRN: [id number] Visit Date: 03/11/2020              Requested by: Alroy Dust, L.Marlou Sa, [place] [address] [employer] [address] [place],   [id number] PCP: Alroy Dust, L.Marlou Sa, MD   Assessment & Plan: Visit Diagnoses:  1. Low back pain, unspecified back pain laterality, unspecified chronicity, unspecified whether sciatica present     Plan: I went over his x-rays in detail.  I feel that he is a good candidate for outpatient physical therapy for his lower thoracic and lumbar spine pain.  Any modalities per the therapist discretion.  He has done very well with therapy in the past and this will probably help him quite a bit.  All questions and concerns were answered and addressed.  He agrees with this treatment plan.  Follow-Up Instructions: Return in about 6 weeks (around 04/22/2020).   Orders:  Orders Placed This Encounter  Procedures  . XR Lumbar Spine 2-3 Views   No orders of the defined types were placed in this encounter.     Procedures: No procedures performed   Clinical Data: No additional findings.   Subjective: Chief Complaint  Patient presents with  . Spine - Follow-up  The patient is a 69 year old gentleman I seen before.  He is a cachectic individual having had multiple abdominal surgeries in the past.  I believe he still has some type of feeding tube as well.  He comes in today for a flareup of back pain he states.  There is no known injury.  He points to the lower thoracic spine and upper lumbar spine as a source of his pain.  Sometimes his right leg gets numb he states.  He is not a diabetic.  He is on chronic pain medications as well as muscle relaxants.  Just a month ago he recently had some type of procedure as it relates to kidney stones.  He walks without assistive device.  He does have remote history of neck pain and I sent him to physical therapy for that as an outpatient and he  did well.  He is amenable to physical therapy.  HPI  Review of Systems He currently denies any headache, chest pain, shortness of breath, fever, chills, nausea, vomiting  Objective: Vital Signs: There were no vitals taken for this visit.  Physical Exam He is alert and oriented x3 and in no acute distress Ortho Exam Examination of his lumbar spine shows no malalignment that I can see.  He has pain in the posterior elements from the lower thoracic to upper lumbar spine.  He has good flexion extension of his lumbar spine.  There is no rashes.  He has negative straight leg raise bilaterally and normal sensation today and normal strength in his bilateral lower extremities. Specialty Comments:  No specialty comments available.  Imaging: XR Lumbar Spine 2-3 Views  Result Date: 03/11/2020 2 views of the lumbar spine show no acute findings of the spine itself.  The alignment is well-maintained.  There is mild degenerative change at the disc spaces and mild arthritic changes in the posterior elements.    PMFS History: Patient Active Problem List   Diagnosis Date Noted  . Bilateral inguinal hernia 09/28/2018  . Incisional hernia 03/17/2018  . Trigger thumb, left thumb 01/19/2018  . Trigger finger, left index  finger 07/18/2017  . Trigger finger, right ring finger 01/31/2017  . Trigger finger of left thumb 01/03/2017  . Trigger finger of right thumb 01/03/2017  . Trigger index finger of left hand 01/03/2017  . Trigger index finger of right hand 01/03/2017  . Malnutrition of moderate degree 07/19/2016  . Intra-abdominal abscess ([place]) 07/16/2016  . Cellulitis 07/15/2016  . S/P colostomy takedown 06/28/2016  . Pain in thoracic spine 06/09/2016  . Mid back pain 06/09/2016  . Postoperative fever 11/19/2015  . 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 Severe protein-calorie malnutrition ([place]) 11/17/2015  . Sepsis ([place]) 11/14/2015  . Hiccups 11/14/2015  . AKI (acute kidney injury) ([place])  11/14/2015  . Fever   . Leg swelling   . Left shoulder pain   . Muscle spasm of left shoulder   . GI bleed 10/27/2015  . Acute blood loss anemia 10/27/2015  . Syncope 10/27/2015  . Hyperglycemia 10/27/2015  . Hypotension 10/27/2015  . Neck pain 10/27/2015  . Hematemesis 10/27/2015  . Hematochezia 10/27/2015  . Diverticulitis of colon with perforation 07/05/2015   Past Medical History:  Diagnosis Date  . Anemia   . Anxiety   . BPH (benign prostatic hyperplasia)   . Gastritis   . GERD (gastroesophageal reflux disease)    "seldom" (07/16/2016)  . GI bleed due to NSAIDs 10/27/2015  . History of blood transfusion 06/2016   post OR/notes 07/15/2016  . History of hiatal hernia   . History of kidney stones   . Hyperlipidemia   . Hypertension    no meds   . 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 Sigmoid diverticulitis    with perforation    Family History  Problem Relation Age of Onset  . Stroke Mother   . Stroke Brother   . Heart disease Brother     Past Surgical History:  Procedure Laterality Date  . COLON SURGERY     sigmoid  . COLOSTOMY TAKEDOWN N/A 06/28/2016   Procedure: COLOSTOMY TAKEDOWN;  Surgeon: [name], MD;  Location: [place];  Service: General;  Laterality: N/A;  . CYSTOSCOPY WITH RETROGRADE PYELOGRAM, URETEROSCOPY AND STENT PLACEMENT Bilateral 02/12/2020   Procedure: CYSTOSCOPY WITH BILATERAL RETROGRADE PYELOGRAM,  [place];  Surgeon: Remi Haggard, MD;  Location: [facility];  Service: Urology;  Laterality: Bilateral;  1 HR  . ESOPHAGOGASTRODUODENOSCOPY N/A 10/27/2015   Procedure: ESOPHAGOGASTRODUODENOSCOPY (EGD);  Surgeon: Clarene Essex, MD;  Location: [facility] ENDOSCOPY;  Service: Endoscopy;  Laterality: N/A;  . ESOPHAGOGASTRODUODENOSCOPY N/A 10/29/2015   Procedure: ESOPHAGOGASTRODUODENOSCOPY (EGD);  Surgeon: Ronald Lobo, MD;  Location: [facility] ENDOSCOPY;  Service: Endoscopy;  Laterality: N/A;  . ESOPHAGOGASTRODUODENOSCOPY N/A 10/30/2015   Procedure:  ESOPHAGOGASTRODUODENOSCOPY (EGD);  Surgeon: Clarene Essex, MD;  Location: [facility] ENDOSCOPY;  Service: Endoscopy;  Laterality: N/A;  . GASTROSTOMY TUBE PLACEMENT  11/21/2015   REDUCTION OF HIATAL HERNIA , REPAIR HIATAL HERNIA, RESECTION SMALL BOWEL WITH ANASTOMOSIS, PLACEMENT GASTROSTOMY TUBE, PLACEMENT DUODENOSTOMY TUBE (N/A)  . HEMORRHOID BANDING  X 2  . HERNIA REPAIR    . HIATAL HERNIA REPAIR N/A 11/21/2015   Procedure: REDUCTION OF HIATAL HERNIA , REPAIR HIATAL HERNIA, RESECTION SMALL BOWEL WITH ANASTOMOSIS, PLACEMENT GASTROSTOMY TUBE, PLACEMENT DUODENOSTOMY TUBE;  Surgeon: Mickeal Skinner, MD;  Location: [place];  Service: General;  Laterality: N/A;  . HOLMIUM LASER APPLICATION Right 49/70/2637   Procedure: HOLMIUM LASER APPLICATION;  Surgeon: Remi Haggard, MD;  Location: [facility];  Service: Urology;  Laterality: Right;  . [place]  06/28/2016   open/notes 07/15/2016  . INCISIONAL HERNIA REPAIR  03/17/2018   WITH MESH  . INCISIONAL HERNIA REPAIR N/A 03/17/2018   Procedure: INCISIONAL HERNIA REPAIR WITH MESH;  Surgeon: [name], MD;  Location: [place];  Service: General;  Laterality: N/A;  . INGUINAL HERNIA REPAIR Bilateral 09/28/2018  . INGUINAL HERNIA REPAIR Bilateral 09/28/2018   Procedure: BILATERAL OPEN INGUINAL HERNIA REPAIR WITH MESH;  Surgeon: [name], MD;  Location: [place];  Service: General;  Laterality: Bilateral;  GENERAL AND TAP BLOCK  . INSERTION OF MESH N/A 03/17/2018   Procedure: INSERTION OF MESH;  Surgeon: [name], MD;  Location: [place];  Service: General;  Laterality: N/A;  . INSERTION OF MESH Bilateral 09/28/2018   Procedure: Insertion Of Mesh;  Surgeon: [name], MD;  Location: [place];  Service: General;  Laterality: Bilateral;  . IR CM INJ ANY COLONIC TUBE W/FLUORO  02/04/2017  . IR GUIDED DRAIN W CATHETER PLACEMENT  07/06/2016   /NOTES 07/15/2016  . IR PATIENT EVAL TECH 0-60 MINS  06/28/2019  . IR RADIOLOGIST EVAL & MGMT   07/27/2016  . IR RADIOLOGIST EVAL & MGMT  08/17/2016  . IR RADIOLOGIST EVAL & MGMT  08/26/2016  . IR REPLACE G-TUBE SIMPLE WO FLUORO  07/28/2017  . IR REPLACE G-TUBE SIMPLE WO FLUORO  01/31/2018  . IR REPLACE G-TUBE SIMPLE WO FLUORO  10/16/2018  . IR REPLACE G-TUBE SIMPLE WO FLUORO  03/05/2019  . IR REPLACE G-TUBE SIMPLE WO FLUORO  08/22/2019  . IR REPLACE G-TUBE SIMPLE WO FLUORO  01/08/2020  . IR [place] TUBE PERCUT W/FLUORO  08/04/2016  . IR [place] TUBE PERCUT W/FLUORO  01/14/2017  . IR US GUIDE BX ASP/DRAIN  08/04/2016  . KNEE CARTILAGE SURGERY Right 1971   "opened me up"  . LAPAROTOMY N/A 07/05/2015   Procedure: PARTIAL SIGMOID COLECTOMY AND COLOSTOMY;  Surgeon: [name], MD;  Location: [place];  Service: General;  Laterality: N/A;  . MELANOMA EXCISION  2001  . 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 REPAIR OF PERFORATED ULCER N/A 10/30/2015   Procedure: REPAIR OF BLEEDING  ULCER;  Surgeon: Judeth Horn, MD;  Location: [place];  Service: General;  Laterality: N/A;  . TUMOR EXCISION  2009   "back; fatty tumor"   Social History   Occupational History  . Occupation: unable to work since April  Tobacco Use  . Smoking status: Never Smoker  . Smokeless tobacco: Former Systems developer    Types: Snuff  Vaping Use  . Vaping Use: Never used  Substance and Sexual Activity  . Alcohol use: No  . Drug use: No  . Sexual activity: Not Currently

## 2020-03-14 DIAGNOSIS — K432 Incisional hernia without obstruction or gangrene: Secondary | ICD-10-CM | POA: Diagnosis not present

## 2020-04-03 ENCOUNTER — Ambulatory Visit: Payer: Medicare Other | Attending: Orthopaedic Surgery | Admitting: Physical Therapy

## 2020-04-03 ENCOUNTER — Other Ambulatory Visit: Payer: Self-pay

## 2020-04-03 ENCOUNTER — Encounter: Payer: Self-pay | Admitting: Physical Therapy

## 2020-04-03 DIAGNOSIS — G8929 Other chronic pain: Secondary | ICD-10-CM | POA: Insufficient documentation

## 2020-04-03 DIAGNOSIS — M545 Low back pain, unspecified: Secondary | ICD-10-CM | POA: Insufficient documentation

## 2020-04-03 DIAGNOSIS — R293 Abnormal posture: Secondary | ICD-10-CM | POA: Insufficient documentation

## 2020-04-03 DIAGNOSIS — R252 Cramp and spasm: Secondary | ICD-10-CM | POA: Insufficient documentation

## 2020-04-03 DIAGNOSIS — M546 Pain in thoracic spine: Secondary | ICD-10-CM | POA: Diagnosis not present

## 2020-04-03 NOTE — Therapy (Signed)
[place], [name], [address] Phone: [phone]   Fax:  [phone]  Physical Therapy Evaluation  Patient Details  Name: Miguel [name] MRN: [id number] Date of Birth: [birth year] Referring Provider (PT): Dr Jean Rosenthal   Encounter Date: 04/03/2020   PT End of Session - 04/03/20 1615    Visit Number 1    Number of Visits 12    Date for PT Re-Evaluation 05/15/20    PT Start Time 1100    PT Stop Time 1140    PT Time Calculation (min) 40 min    Activity Tolerance Patient tolerated treatment well    Behavior During Therapy [facility] for tasks assessed/performed           Past Medical History:  Diagnosis Date  . Anemia   . Anxiety   . BPH (benign prostatic hyperplasia)   . Gastritis   . GERD (gastroesophageal reflux disease)    "seldom" (07/16/2016)  . GI bleed due to NSAIDs 10/27/2015  . History of blood transfusion 06/2016   post OR/notes 07/15/2016  . History of hiatal hernia   . History of kidney stones   . Hyperlipidemia   . Hypertension    no meds   . Melanoma of back ([place])    "mid back"  . Sigmoid diverticulitis    with perforation    Past Surgical History:  Procedure Laterality Date  . COLON SURGERY     sigmoid  . COLOSTOMY TAKEDOWN N/A 06/28/2016   Procedure: COLOSTOMY TAKEDOWN;  Surgeon: [name], MD;  Location: [place];  Service: General;  Laterality: N/A;  . CYSTOSCOPY WITH RETROGRADE PYELOGRAM, URETEROSCOPY AND STENT PLACEMENT Bilateral 02/12/2020   Procedure: CYSTOSCOPY WITH BILATERAL RETROGRADE PYELOGRAM,  [place];  Surgeon: Remi Haggard, MD;  Location: [facility];  Service: Urology;  Laterality: Bilateral;  1 HR  . ESOPHAGOGASTRODUODENOSCOPY N/A 10/27/2015   Procedure: ESOPHAGOGASTRODUODENOSCOPY (EGD);  Surgeon: Clarene Essex, MD;  [facility] END[facility]COPY;  Service: Endoscopy;  Laterality: N/A;  . ESOPHAGOGASTRODUODENOSCOPY N/A 10/29/2015   Procedure:  ESOPHAGOGASTRODUODENOSCOPY (EGD);  Surgeon: Ronald Lobo, MD;  Location: [facility] ENDOSCOPY;  Service: Endoscopy;  Laterality: N/A;  . ESOPHAGOGASTRODUODENOSCOPY N/A 10/30/2015   Procedure: ESOPHAGOGASTRODUODENOSCOPY (EGD);  Surgeon: Clarene Essex, MD;  Location[facility] ENDOSCOPY;  Service: Endoscopy;  Laterality: N/A;  . GASTROSTOMY TUBE PLACEMENT  11/21/2015   REDUCTION OF HIATAL HERNIA , REPAIR HIATAL HERNIA, RESECTION SMALL BOWEL WITH ANASTOMOSIS, PLACEMENT GASTROSTOMY TUBE, PLACEMENT DUODENOSTOMY TUBE (N/A)  . HEMORRHOID BANDING  X 2  . HERNIA REPAIR    . HIATAL HERNIA REPAIR N/A 11/21/2015   Procedure: REDUCTION OF HIATAL HERNIA , REPAIR HIATAL HERNIA, RESECTION SMALL BOWEL WITH ANASTOMOSIS, PLACEMENT GASTROSTOMY TUBE, PLACEMENT DUODENOSTOMY TUBE;  Surgeon: Mickeal Skinner, MD;  Location: [place];  Service: General;  Laterality: N/A;  . HOLMIUM LASER APPLICATION Right [id number]   Procedure: HOLMIUM LASER APPLICATION;  Surgeon: Remi Haggard, MD;  Location: [facility];  Service: Urology;  Laterality: Right;  . INCISIONAL HERNIA REPAIR  06/28/2016   open/notes 07/15/2016  . INCISIONAL HERNIA REPAIR  03/17/2018   WITH MESH  . INCISIONAL HERNIA REPAIR N/A 03/17/2018   Procedure: INCISIONAL HERNIA REPAIR WITH MESH;  Surgeon: [name], MD;  Location: [place];  Service: General;  Laterality: N/A;  . INGUINAL HERNIA REPAIR Bilateral 09/28/2018  . INGUINAL HERNIA REPAIR Bilateral 09/28/2018   Procedure: BILATERAL OPEN INGUINAL HERNIA REPAIR WITH MESH;  Surgeon: [name], MD;  Location: [place];  Service:  General;  Laterality: Bilateral;  GENERAL AND TAP BLOCK  . INSERTION OF MESH N/A 03/17/2018   Procedure: INSERTION OF MESH;  Surgeon: [name], MD;  Location: [place];  Service: General;  Laterality: N/A;  . INSERTION OF MESH Bilateral 09/28/2018   Procedure: Insertion Of Mesh;  Surgeon: [name], MD;  Location: [place];  Service: General;  Laterality: Bilateral;  . IR CM INJ ANY  COLONIC TUBE W/FLUORO  02/04/2017  . IR GUIDED DRAIN W CATHETER PLACEMENT  07/06/2016   /NOTES 07/15/2016  . IR PATIENT EVAL TECH 0-60 MINS  06/28/2019  . IR RADIOLOGIST EVAL & MGMT  07/27/2016  . IR RADIOLOGIST EVAL & MGMT  08/17/2016  . IR RADIOLOGIST EVAL & MGMT  08/26/2016  . IR REPLACE G-TUBE SIMPLE WO FLUORO  07/28/2017  . IR REPLACE G-TUBE SIMPLE WO FLUORO  01/31/2018  . IR REPLACE G-TUBE SIMPLE WO FLUORO  10/16/2018  . IR REPLACE G-TUBE SIMPLE WO FLUORO  03/05/2019  . IR REPLACE G-TUBE SIMPLE WO FLUORO  08/22/2019  . IR REPLACE G-TUBE SIMPLE WO FLUORO  01/08/2020  . IR [place] TUBE PERCUT W/FLUORO  08/04/2016  . IR [place] TUBE PERCUT W/FLUORO  01/14/2017  . IR US GUIDE BX ASP/DRAIN  08/04/2016  . KNEE CARTILAGE SURGERY Right 1971   "opened me up"  . LAPAROTOMY N/A 07/05/2015   Procedure: PARTIAL SIGMOID COLECTOMY AND COLOSTOMY;  Surgeon: [name], MD;  Location: [place];  Service: General;  Laterality: N/A;  . MELANOMA EXCISION  2001  . 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 REPAIR OF PERFORATED ULCER N/A 10/30/2015   Procedure: REPAIR OF BLEEDING  ULCER;  Surgeon: Judeth Horn, MD;  Location: [place];  Service: General;  Laterality: N/A;  . TUMOR EXCISION  2009   "back; fatty tumor"    There were no vitals filed for this visit.    Subjective Assessment - 04/03/20 1109    Subjective Patient has a long hispotry of lower back pain. He has extenve stomach issues. He currently has a feeding tube which he has had for several years. He has had increasing low back and mid thoraic pain for the past several months. he was having stomach pain but now that that is subsiding he is relizing trhat he is having a lot of back pain as well.    Pertinent History multiple hernias, [place] feeding tube, anemia, anxiety    How long can you sit comfortably?  No pain    How long can you stand comfortably? pain when he stands for too long    How long can you walk comfortably? pain while walking longer distances    Diagnostic tests x-ray: mild degenerative changes    Patient Stated Goals to be able to be active    Currently in Pain? Yes    Pain Score 7     Pain Orientation Left;Right    Pain Descriptors / Indicators Aching    Pain Onset More than a month ago    Pain Frequency Intermittent    Aggravating Factors  standing    Pain Relieving Factors heat pad and medicine    Effect of Pain on Daily Activities difficulty standing for a period of time    Multiple Pain Sites Yes    Pain Score 6    Pain Location Back  Pain Orientation Right;Left   more on the left side and into the ribs   Pain Radiating Towards radiates around the left rib    Pain Onset More than a month ago    Pain Frequency Intermittent    Aggravating Factors  perfroming activity with back    Pain Relieving Factors rest    Effect of Pain on Daily Activities difficulty being active              [facility] PT Assessment - 04/03/20 0001      Assessment   Medical Diagnosis Low Back/Mid throacic pain    Referring Provider (PT) Dr Jean Rosenthal    Hand Dominance Right    Next MD Visit few weeks    Prior Therapy Had therapy with improved symptoms in 2018      Precautions   Precautions None      Restrictions   Weight Bearing Restrictions No      Balance Screen   Has the patient fallen in the past 6 months No    Has the patient had a decrease in activity level because of a fear of falling?  No    Is the patient reluctant to leave their home because of a fear of falling?  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    Leisure likes to work around his hous e      Cognition   Overall Cognitive Status Within Functional Limits for tasks assessed    Attention Focused    Focused  Attention Appears intact    Awareness Appears intact    Problem Solving Appears intact      Observation/Other Assessments   Focus on Therapeutic Outcomes (FOTO)  Set up for the wrong body part      Sensation   Light Touch Appears Intact    Additional Comments reports hehas some sensativity in his heel R at times at night      Coordination   Gross Motor Movements are Fluid and Coordinated Yes    Fine Motor Movements are Fluid and Coordinated Yes      Posture/Postural Control   Posture Comments significant kyphphosis in standing      ROM / Strength   AROM / PROM / Strength AROM;PROM;Strength      AROM   Overall AROM Comments limited throacic extension, cavitation notedwith throacic extnesion    AROM Assessment Site Lumbar    Lumbar Flexion 38    Lumbar Extension 15    Lumbar - Right Rotation 25% limited    Lumbar - Left Rotation 25% limited with pulling noted inthroacic spine      PROM   Overall PROM Comments limited movement with end range hip flexion and bilateral IR      Strength   Strength Assessment Site Knee;Hip    Right/Left Hip Right;Left    Right Hip Flexion 4+/5    Right Hip ABduction 4+/5    Left Hip Flexion 4+/5    Left Hip ABduction 4+/5    Right/Left Knee Right;Left    Right Knee Extension 4+/5    Left Knee Extension 4+/5      Palpation   Palpation comment spasming laon gparaspinals from t-3 to L5 and intoupper gluteals                      Objective measurements completed on examination:  See above findings.       [facility] Adult PT Treatment/Exercise - 04/03/20 0001      Exercises   Exercises Lumbar      Lumbar Exercises: Stretches   Lower Trunk Rotation Limitations x20    Piriformis Stretch Limitations glute stretch 3x20 sec hold    Other Lumbar Stretch Exercise open book stretch    Other Lumbar Stretch Exercise tennis ball trigger poitn release      Manual Therapy   Manual Therapy Soft tissue mobilization    Soft tissue  mobilization triggerpoint release to para-spinals                  PT Education - 04/03/20 1122    Education Details reviewed HEP and symptom management    Person(s) Educated Patient    Methods Explanation;Verbal cues;Handout;Demonstration;Tactile cues    Comprehension Returned demonstration;Verbal cues required;Tactile cues required;Verbalized understanding            PT Short Term Goals - 04/03/20 1531      PT SHORT TERM GOAL #1   Title Pt will be indepenent in HEP as it has been established     Time 3    Period Weeks    Status On-going      PT SHORT TERM GOAL #2   Title Patient will increase lumbar flexion by 20 degrees    Time 3    Period Weeks    Status New    Target Date 04/24/20      PT SHORT TERM GOAL #3   Title Patient will increase billateral LE strength to 5/5    Time 3    Period Weeks    Status New    Target Date 04/24/20             PT Long Term Goals - 04/03/20 1617      PT LONG TERM GOAL #1   Title Patient will perform ADL's and IADL's without pain in his back    Time 6    Period Weeks    Status New    Target Date 05/15/20      PT LONG TERM GOAL #2   Title Patient will ambualte 3000' without increased pain    Time 6    Period Weeks    Status New    Target Date 05/15/20                  Plan - 04/03/20 1507    Clinical Impression Statement Patient is a 70 year old male with mid throacic pain and lumbar pain. he has increased pain when he stands and walks. He has an exagerated thoracic kyphois. He has significant spasming along biulateral paraspinals from T-3 to L5 < L. He has radiating pain around his left lower rib cahge. His feeding tube is on the left side. he has had therapy with benefit in the past. he would benefit from skilled therapy to improve ability to stand staight, manage pain, and perfrom funtional activity.    Personal Factors and Comorbidities Comorbidity 1;Comorbidity 2;Comorbidity 3+    Comorbidities  anxiety, extensive histroy of stomach pain, feeding tube    Examination-Activity Limitations Carry;Lift;Squat;Stand    Stability/Clinical Decision Making Evolving/Moderate complexity    Clinical Decision Making High    Rehab Potential Good    PT Frequency 2x / week    PT Duration 6 weeks    PT Treatment/Interventions ADLs/Self Care Home Management;Cryotherapy;Electrical Stimulation;Iontophoresis 4mg /ml Dexamethasone;Moist Heat;Ultrasound;Functional mobility training;Therapeutic activities;Therapeutic exercise;Balance training;Neuromuscular  re-education;Patient/family education;Manual techniques;Passive range of motion;Dry needling;Taping    PT Next Visit Plan patient had psoitive repsose to TPDN last episode; manual therapy to trigger points, review rotational stretching movements; Consider thoracic PA's; begin light core strengthening. Keep in mind he has had extensive stomach surgery.    PT Home Exercise Plan LTR, open book, glute stretch; tennis ball trigger poitn release    Consulted and Agree with Plan of Care Patient           Patient will benefit from skilled therapeutic intervention in order to improve the following deficits and impairments:  Increased fascial restricitons,Decreased range of motion,Increased muscle spasms,Pain,Decreased endurance,Decreased strength,Decreased mobility,Postural dysfunction  Visit Diagnosis: Chronic bilateral low back pain without sciatica  Pain in thoracic spine  Cramp and spasm  Abnormal posture     Problem List Patient Active Problem List   Diagnosis Date Noted  . Bilateral inguinal hernia 09/28/2018  . Incisional hernia 03/17/2018  . Trigger thumb, left thumb 01/19/2018  . Trigger finger, left index finger 07/18/2017  . Trigger finger, right ring finger 01/31/2017  . Trigger finger of left thumb 01/03/2017  . Trigger finger of right thumb 01/03/2017  . Trigger index finger of left hand 01/03/2017  . Trigger index finger of right hand  01/03/2017  . Malnutrition of moderate degree 07/19/2016  . Intra-abdominal abscess ([place]) 07/16/2016  . Cellulitis 07/15/2016  . S/P colostomy takedown 06/28/2016  . Pain in thoracic spine 06/09/2016  . Mid back pain 06/09/2016  . Postoperative fever 11/19/2015  . 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 Severe protein-calorie malnutrition ([place]) 11/17/2015  . Sepsis ([place]) 11/14/2015  . Hiccups 11/14/2015  . AKI (acute kidney injury) ([place]) 11/14/2015  . Fever   . Leg swelling   . Left shoulder pain   . Muscle spasm of left shoulder   . GI bleed 10/27/2015  . Acute blood loss anemia 10/27/2015  . Syncope 10/27/2015  . Hyperglycemia 10/27/2015  . Hypotension 10/27/2015  . Neck pain 10/27/2015  . Hematemesis 10/27/2015  . Hematochezia 10/27/2015  . Diverticulitis of colon with perforation 07/05/2015    Carney Living PT DPT  04/03/2020, 4:24 PM  [facility] [address] [place], [name], [address] Phone: [phone]   Fax:  [phone]  Name: Miguel [name] MRN: [id number] Date of Birth: 1951-03-20

## 2020-04-03 NOTE — Patient Instructions (Signed)
Access Code: [email or web address]: [email or web address]: 01/06/2022Prepared by: Onalee Hua CarrollExercises  Sidelying Open Book Thoracic Lumbar Rotation and Extension - 2 x daily - 7 x weekly - 3 sets - 10 reps  Supine Lower Trunk Rotation - 2 x daily - 7 x weekly - 1 sets - 10 reps  Standing Glute Med Mobilization with Small Ball on Wall - 2 x daily - 7 x weekly - 3 sets - 10 reps  Supine Piriformis Stretch with Foot on Ground - 2 x daily - 7 x weekly - 1 sets - 3 reps - 20 sec hold

## 2020-04-10 ENCOUNTER — Encounter: Payer: Self-pay | Admitting: Physical Therapy

## 2020-04-10 ENCOUNTER — Ambulatory Visit: Payer: Medicare Other | Admitting: Physical Therapy

## 2020-04-10 ENCOUNTER — Other Ambulatory Visit: Payer: Self-pay

## 2020-04-10 DIAGNOSIS — G8929 Other chronic pain: Secondary | ICD-10-CM | POA: Diagnosis not present

## 2020-04-10 DIAGNOSIS — M545 Low back pain, unspecified: Secondary | ICD-10-CM

## 2020-04-10 DIAGNOSIS — M546 Pain in thoracic spine: Secondary | ICD-10-CM | POA: Diagnosis not present

## 2020-04-10 DIAGNOSIS — R293 Abnormal posture: Secondary | ICD-10-CM

## 2020-04-10 DIAGNOSIS — R252 Cramp and spasm: Secondary | ICD-10-CM | POA: Diagnosis not present

## 2020-04-11 ENCOUNTER — Encounter: Payer: Self-pay | Admitting: Physical Therapy

## 2020-04-11 NOTE — Therapy (Signed)
[place], [name], [address] Phone: [phone]   Fax:  [phone]  Physical Therapy Treatment  Patient Details  Name: Miguel [name] MRN: [id number] Date of Birth: 06/19/1950 Referring Provider (PT): Dr Jean Rosenthal   Encounter Date: 04/10/2020   PT End of Session - 04/11/20 1128    Visit Number 2    Number of Visits 12    Date for PT Re-Evaluation 05/15/20    PT Start Time 1550   Patient 5 minutes late   PT Stop Time 1630    PT Time Calculation (min) 40 min    Activity Tolerance Patient tolerated treatment well    Behavior During Therapy [facility] for tasks assessed/performed           Past Medical History:  Diagnosis Date  . Anemia   . Anxiety   . BPH (benign prostatic hyperplasia)   . Gastritis   . GERD (gastroesophageal reflux disease)    "seldom" (07/16/2016)  . GI bleed due to NSAIDs 10/27/2015  . History of blood transfusion 06/2016   post OR/notes 07/15/2016  . History of hiatal hernia   . History of kidney stones   . Hyperlipidemia   . Hypertension    no meds   . Melanoma of back ([place])    "mid back"  . Sigmoid diverticulitis    with perforation    Past Surgical History:  Procedure Laterality Date  . COLON SURGERY     sigmoid  . COLOSTOMY TAKEDOWN N/A 06/28/2016   Procedure: COLOSTOMY TAKEDOWN;  Surgeon: [name], MD;  Location: [place];  Service: General;  Laterality: N/A;  . CYSTOSCOPY WITH RETROGRADE PYELOGRAM, URETEROSCOPY AND STENT PLACEMENT Bilateral 02/12/2020   Procedure: CYSTOSCOPY WITH BILATERAL RETROGRADE PYELOGRAM,  [place];  Surgeon: Remi Haggard, MD;  [facility];  Service: Urology;  Laterality: Bilateral;  1 HR  . ESOPHAGOGASTRODUODENOSCOPY N/A 10/27/2015   Procedure: ESOPHAGOGASTRODUODENOSCOPY (EGD);  Surgeon: Clarene Essex, MD;  Location: [facility] ENDOSCOPY;  Service: Endoscopy;  Laterality: N/A;  . ESOPHAGOGASTRODUODENOSCOPY N/A 10/29/2015    Procedure: ESOPHAGOGASTRODUODENOSCOPY (EGD);  Surgeon: Ronald Lobo, MD;  Location: [facility] ENDOSCOPY;  Service: Endoscopy;  Laterality: N/A;  . ESOPHAGOGASTRODUODENOSCOPY N/A 10/30/2015   Procedure: ESOPHAGOGASTRODUODENOSCOPY (EGD);  Surgeon: Clarene Essex, MD;  Location: [facility] ENDOSCOPY;  Service: Endoscopy;  Laterality: N/A;  . GASTROSTOMY TUBE PLACEMENT  11/21/2015   REDUCTION OF HIATAL HERNIA , REPAIR HIATAL HERNIA, RESECTION SMALL BOWEL WITH ANASTOMOSIS, PLACEMENT GASTROSTOMY TUBE, PLACEMENT DUODENOSTOMY TUBE (N/A)  . HEMORRHOID BANDING  X 2  . HERNIA REPAIR    . HIATAL HERNIA REPAIR N/A 11/21/2015   Procedure: REDUCTION OF HIATAL HERNIA , REPAIR HIATAL HERNIA, RESECTION SMALL BOWEL WITH ANASTOMOSIS, PLACEMENT GASTROSTOMY TUBE, PLACEMENT DUODENOSTOMY TUBE;  Surgeon: Mickeal Skinner, MD;  Location: [place];  Service: General;  Laterality: N/A;  . HOLMIUM LASER APPLICATION Right 58/52/7782   Procedure: HOLMIUM LASER APPLICATION;  Surgeon: Remi Haggard, MD;  [facility];  Service: Urology;  Laterality: Right;  . INCISIONAL HERNIA REPAIR  06/28/2016   open/notes 07/15/2016  . INCISIONAL HERNIA REPAIR  03/17/2018   WITH MESH  . INCISIONAL HERNIA REPAIR N/A 03/17/2018   Procedure: INCISIONAL HERNIA REPAIR WITH MESH;  Surgeon: [name], MD;  Location: [place];  Service: General;  Laterality: N/A;  . INGUINAL HERNIA REPAIR Bilateral 09/28/2018  . INGUINAL HERNIA REPAIR Bilateral 09/28/2018   Procedure: BILATERAL OPEN INGUINAL HERNIA REPAIR WITH MESH;  Surgeon: [name], MD;  [facility];  Service: General;  Laterality: Bilateral;  GENERAL AND TAP BLOCK  . INSERTION OF MESH N/A 03/17/2018   Procedure: INSERTION OF MESH;  Surgeon: [name], MD;  Location: [place];  Service: General;  Laterality: N/A;  . INSERTION OF MESH Bilateral 09/28/2018   Procedure: Insertion Of Mesh;  Surgeon: [name], MD;  Location: [place];  Service: General;  Laterality: Bilateral;  . IR CM  INJ ANY COLONIC TUBE W/FLUORO  02/04/2017  . IR GUIDED DRAIN W CATHETER PLACEMENT  07/06/2016   /NOTES 07/15/2016  . IR PATIENT EVAL TECH 0-60 MINS  06/28/2019  . IR RADIOLOGIST EVAL & MGMT  07/27/2016  . IR RADIOLOGIST EVAL & MGMT  08/17/2016  . IR RADIOLOGIST EVAL & MGMT  08/26/2016  . IR REPLACE G-TUBE SIMPLE WO FLUORO  07/28/2017  . IR REPLACE G-TUBE SIMPLE WO FLUORO  01/31/2018  . IR REPLACE G-TUBE SIMPLE WO FLUORO  10/16/2018  . IR REPLACE G-TUBE SIMPLE WO FLUORO  03/05/2019  . IR REPLACE G-TUBE SIMPLE WO FLUORO  08/22/2019  . IR REPLACE G-TUBE SIMPLE WO FLUORO  01/08/2020  . IR [place] TUBE PERCUT W/FLUORO  08/04/2016  . IR [place] TUBE PERCUT W/FLUORO  01/14/2017  . IR US GUIDE BX ASP/DRAIN  08/04/2016  . KNEE CARTILAGE SURGERY Right 1971   "opened me up"  . LAPAROTOMY N/A 07/05/2015   Procedure: PARTIAL SIGMOID COLECTOMY AND COLOSTOMY;  Surgeon: [name], MD;  Location: [place];  Service: General;  Laterality: N/A;  . MELANOMA EXCISION  2001  . 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 REPAIR OF PERFORATED ULCER N/A 10/30/2015   Procedure: REPAIR OF BLEEDING  ULCER;  Surgeon: Judeth Horn, MD;  Location: [place];  Service: General;  Laterality: N/A;  . TUMOR EXCISION  2009   "back; fatty tumor"    There were no vitals filed for this visit.   Subjective Assessment - 04/10/20 1554    Subjective Patient reports over the past few days he has been a little better. Today he feels like he is having pain in his mid/lower back.    Pertinent History multiple hernias, Somach feeding tube, anemia, anxiety    How long can you sit comfortably? No pain    How long can you stand comfortably? pain when he stands for too long    How long can you walk comfortably? pain while walking longer distances    Diagnostic tests x-ray: mild degenerative changes     Patient Stated Goals to be able to be active    Currently in Pain? Yes    Pain Score 5     Pain Location Back    Pain Orientation Mid;Lower    Pain Descriptors / Indicators Aching    Pain Type Chronic pain    Pain Onset More than a month ago    Pain Frequency Intermittent    Aggravating Factors  standing and walking    Pain Relieving Factors heating pad    Effect of Pain on Daily Activities difficulty stanbding for a long period of time    Multiple Pain Sites No                             [facility] Adult PT Treatment/Exercise - 04/11/20 0001  Lumbar Exercises: Stretches   Lower Trunk Rotation Limitations x20    Piriformis Stretch Limitations glute stretch 3x20 sec hold      Lumbar Exercises: Seated   Other Seated Lumbar Exercises bilateral ER red 2x10      Manual Therapy   Manual Therapy Joint mobilization;Manual Traction    Joint Mobilization PA Mobilizations from t-3 to T-12    Soft tissue mobilization triggerpoint release to para-spinals    Manual Traction gentle LAD bilateral with grade II and II LAD            Trigger Point Dry Needling - 04/11/20 0001    Consent Given? Yes    Education Handout Provided Yes    Muscles Treated Back/Hip Thoracic multifidi;Lumbar multifidi    Lumbar multifidi Response Twitch response elicited;Palpable increased muscle length    Thoracic multifidi response Twitch response elicited;Palpable increased muscle length                PT Education - 04/11/20 1127    Education Details reviewed HEP and symptom management    Person(s) Educated Patient    Methods Explanation;Demonstration;Tactile cues;Verbal cues    Comprehension Returned demonstration;Verbalized understanding;Verbal cues required;Tactile cues required            PT Short Term Goals - 04/03/20 1531      PT SHORT TERM GOAL #1   Title Pt will be indepenent in HEP as it has been established     Time 3    Period Weeks    Status On-going       PT SHORT TERM GOAL #2   Title Patient will increase lumbar flexion by 20 degrees    Time 3    Period Weeks    Status New    Target Date 04/24/20      PT SHORT TERM GOAL #3   Title Patient will increase billateral LE strength to 5/5    Time 3    Period Weeks    Status New    Target Date 04/24/20             PT Long Term Goals - 04/03/20 1617      PT LONG TERM GOAL #1   Title Patient will perform ADL's and IADL's without pain in his back    Time 6    Period Weeks    Status New    Target Date 05/15/20      PT LONG TERM GOAL #2   Title Patient will ambualte 3000' without increased pain    Time 6    Period Weeks    Status New    Target Date 05/15/20                 Plan - 04/11/20 1129    Clinical Impression Statement Patient reported pain in his upper lumbar spine and lower thoracic today. therapy perfromedneedling. He reported a significant improvement in pain with needling and soft tissue mobilization. We will continue to work on improving spinal mobility to make his exercises more effective. He was given a seated exercise for home to eprfrom. Therapy revieed benefitts and risk of TPDN. We will continue to add exercises to eprform at home as we go along.    Personal Factors and Comorbidities Comorbidity 1;Comorbidity 2;Comorbidity 3+    Comorbidities anxiety, extensive histroy of stomach pain, feeding tube    Examination-Activity Limitations Carry;Lift;Squat;Stand    Stability/Clinical Decision Making Evolving/Moderate complexity    Clinical Decision Making High  Rehab Potential Good    PT Frequency 2x / week    PT Duration 6 weeks    PT Treatment/Interventions ADLs/Self Care Home Management;Cryotherapy;Electrical Stimulation;Iontophoresis 4mg /ml Dexamethasone;Moist Heat;Ultrasound;Functional mobility training;Therapeutic activities;Therapeutic exercise;Balance training;Neuromuscular re-education;Patient/family education;Manual techniques;Passive range of  motion;Dry needling;Taping    PT Next Visit Plan patient had positive repsose to TPDN last episode; manual therapy to trigger points, review rotational stretching movements; Consider thoracic PA's; begin light core strengthening. Keep in mind he has had extensive stomach surgery.    PT Home Exercise Plan LTR, open book, glute stretch; tennis ball trigger poitn release    Consulted and Agree with Plan of Care Patient           Patient will benefit from skilled therapeutic intervention in order to improve the following deficits and impairments:  Increased fascial restricitons,Decreased range of motion,Increased muscle spasms,Pain,Decreased endurance,Decreased strength,Decreased mobility,Postural dysfunction  Visit Diagnosis: Chronic bilateral low back pain without sciatica  Pain in thoracic spine  Cramp and spasm  Abnormal posture     Problem List Patient Active Problem List   Diagnosis Date Noted  . Bilateral inguinal hernia 09/28/2018  . Incisional hernia 03/17/2018  . Trigger thumb, left thumb 01/19/2018  . Trigger finger, left index finger 07/18/2017  . Trigger finger, right ring finger 01/31/2017  . Trigger finger of left thumb 01/03/2017  . Trigger finger of right thumb 01/03/2017  . Trigger index finger of left hand 01/03/2017  . Trigger index finger of right hand 01/03/2017  . Malnutrition of moderate degree 07/19/2016  . Intra-abdominal abscess ([place]) 07/16/2016  . Cellulitis 07/15/2016  . S/P colostomy takedown 06/28/2016  . Pain in thoracic spine 06/09/2016  . Mid back pain 06/09/2016  . Postoperative fever 11/19/2015  . 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 Severe protein-calorie malnutrition ([place]) 11/17/2015  . Sepsis ([place]) 11/14/2015  . Hiccups 11/14/2015  . AKI (acute kidney injury) ([place]) 11/14/2015  . Fever   . Leg swelling   . Left shoulder pain   . Muscle spasm of left shoulder   . GI bleed 10/27/2015  . Acute blood loss anemia 10/27/2015  . Syncope  10/27/2015  . Hyperglycemia 10/27/2015  . Hypotension 10/27/2015  . Neck pain 10/27/2015  . Hematemesis 10/27/2015  . Hematochezia 10/27/2015  . Diverticulitis of colon with perforation 07/05/2015    Carney Living PT DPT  04/11/2020, 11:33 AM  [facility] [address] [place], [name], [address] Phone: [phone]   Fax:  [phone]  Name: Miguel [name] MRN: [id number] Date of Birth: May 24, 1950

## 2020-04-18 ENCOUNTER — Encounter: Payer: Self-pay | Admitting: Physical Therapy

## 2020-04-18 ENCOUNTER — Other Ambulatory Visit: Payer: Self-pay

## 2020-04-18 ENCOUNTER — Ambulatory Visit: Payer: Medicare Other | Admitting: Physical Therapy

## 2020-04-18 DIAGNOSIS — M546 Pain in thoracic spine: Secondary | ICD-10-CM | POA: Diagnosis not present

## 2020-04-18 DIAGNOSIS — R252 Cramp and spasm: Secondary | ICD-10-CM | POA: Diagnosis not present

## 2020-04-18 DIAGNOSIS — G8929 Other chronic pain: Secondary | ICD-10-CM

## 2020-04-18 DIAGNOSIS — R293 Abnormal posture: Secondary | ICD-10-CM

## 2020-04-18 DIAGNOSIS — M545 Low back pain, unspecified: Secondary | ICD-10-CM | POA: Diagnosis not present

## 2020-04-18 NOTE — Therapy (Signed)
[place], [name], [address] Phone: [phone]   Fax:  [phone]  Physical Therapy Treatment  Patient Details  Name: Miguel [name] MRN: [id number] Date of Birth: February 27, 1951 Referring Provider (PT): Dr Jean Rosenthal   Encounter Date: 04/18/2020   PT End of Session - 04/18/20 0809    Visit Number 3    Number of Visits 12    Date for PT Re-Evaluation 05/15/20    PT Start Time 0801    PT Stop Time 0844    PT Time Calculation (min) 43 min    Activity Tolerance Patient tolerated treatment well    Behavior During Therapy [facility] for tasks assessed/performed           Past Medical History:  Diagnosis Date   Anemia    Anxiety    BPH (benign prostatic hyperplasia)    Gastritis    GERD (gastroesophageal reflux disease)    "seldom" (07/16/2016)   GI bleed due to NSAIDs 10/27/2015   History of blood transfusion 06/2016   post OR/notes 07/15/2016   History of hiatal hernia    History of kidney stones    Hyperlipidemia    Hypertension    no meds    Melanoma of back ([place])    "mid back"   Sigmoid diverticulitis    with perforation    Past Surgical History:  Procedure Laterality Date   COLON SURGERY     sigmoid   COLOSTOMY TAKEDOWN N/A 06/28/2016   Procedure: COLOSTOMY TAKEDOWN;  Surgeon: [name], MD;  Location: [place];  Service: General;  Laterality: N/A;   CYSTOSCOPY WITH RETROGRADE PYELOGRAM, URETEROSCOPY AND STENT PLACEMENT Bilateral 02/12/2020   Procedure: CYSTOSCOPY WITH BILATERAL RETROGRADE PYELOGRAM,  AND LITHOPEXY;  Surgeon: Remi Haggard, MD;  [facility];  Service: Urology;  Laterality: Bilateral;  1 HR   ESOPHAGOGASTRODUODENOSCOPY N/A 10/27/2015   Procedure: ESOPHAGOGASTRODUODENOSCOPY (EGD);  Surgeon: Clarene Essex, MD;  Location: [facility] END[facility]COPY;  Service: Endoscopy;  Laterality: N/A;   ESOPHAGOGASTRODUODENOSCOPY N/A 10/29/2015   Procedure:  ESOPHAGOGASTRODUODENOSCOPY (EGD);  Surgeon: Ronald Lobo, MD;  Location: [facility] ENDOSCOPY;  Service: Endoscopy;  Laterality: N/A;   ESOPHAGOGASTRODUODENOSCOPY N/A 10/30/2015   Procedure: ESOPHAGOGASTRODUODENOSCOPY (EGD);  Surgeon: Clarene Essex, MD;  Location: [facility] ENDOSCOPY;  Service: Endoscopy;  Laterality: N/A;   GASTROSTOMY TUBE PLACEMENT  11/21/2015   REDUCTION OF HIATAL HERNIA , REPAIR HIATAL HERNIA, RESECTION SMALL BOWEL WITH ANASTOMOSIS, PLACEMENT GASTROSTOMY TUBE, PLACEMENT DUODENOSTOMY TUBE (N/A)   HEMORRHOID BANDING  X 2   HERNIA REPAIR     HIATAL HERNIA REPAIR N/A 11/21/2015   Procedure: REDUCTION OF HIATAL HERNIA , REPAIR HIATAL HERNIA, RESECTION SMALL BOWEL WITH ANASTOMOSIS, PLACEMENT GASTROSTOMY TUBE, PLACEMENT DUODENOSTOMY TUBE;  Surgeon: Mickeal Skinner, MD;  Location: [place];  Service: General;  Laterality: N/A;   HOLMIUM LASER APPLICATION Right 03/31/7251   Procedure: HOLMIUM LASER APPLICATION;  Surgeon: Remi Haggard, MD;  [facility];  Service: Urology;  Laterality: Right;   INCISIONAL HERNIA REPAIR  06/28/2016   open/notes 07/15/2016   INCISIONAL HERNIA REPAIR  03/17/2018   WITH MESH   INCISIONAL HERNIA REPAIR N/A 03/17/2018   Procedure: INCISIONAL HERNIA REPAIR WITH MESH;  Surgeon: [name], MD;  Location: [place];  Service: General;  Laterality: N/A;   INGUINAL HERNIA REPAIR Bilateral 09/28/2018   INGUINAL HERNIA REPAIR Bilateral 09/28/2018   Procedure: BILATERAL OPEN INGUINAL HERNIA REPAIR WITH MESH;  Surgeon: [name], MD;  Location: [place];  Service:  General;  Laterality: Bilateral;  GENERAL AND TAP BLOCK   INSERTION OF MESH N/A 03/17/2018   Procedure: INSERTION OF MESH;  Surgeon: [name], MD;  Location: [place];  Service: General;  Laterality: N/A;   INSERTION OF MESH Bilateral 09/28/2018   Procedure: Insertion Of Mesh;  Surgeon: [name], MD;  Location: [place];  Service: General;  Laterality: Bilateral;   IR CM INJ ANY  COLONIC TUBE W/FLUORO  02/04/2017   IR GUIDED DRAIN W CATHETER PLACEMENT  07/06/2016   /NOTES 07/15/2016   IR PATIENT EVAL TECH 0-60 MINS  06/28/2019   IR RADIOLOGIST EVAL & MGMT  07/27/2016   IR RADIOLOGIST EVAL & MGMT  08/17/2016   IR RADIOLOGIST EVAL & MGMT  08/26/2016   IR REPLACE G-TUBE SIMPLE WO FLUORO  07/28/2017   IR REPLACE G-TUBE SIMPLE WO FLUORO  01/31/2018   IR REPLACE G-TUBE SIMPLE WO FLUORO  10/16/2018   IR REPLACE G-TUBE SIMPLE WO FLUORO  03/05/2019   IR REPLACE G-TUBE SIMPLE WO FLUORO  08/22/2019   IR REPLACE G-TUBE SIMPLE WO FLUORO  01/08/2020   IR [place] GASTRO/COLONIC TUBE PERCUT W/FLUORO  08/04/2016   IR [place] GASTRO/COLONIC TUBE PERCUT W/FLUORO  01/14/2017   IR US GUIDE BX ASP/DRAIN  08/04/2016   KNEE CARTILAGE SURGERY Right 1971   "opened me up"   LAPAROTOMY N/A 07/05/2015   Procedure: PARTIAL SIGMOID COLECTOMY AND COLOSTOMY;  Surgeon: [name], MD;  Location: [place] [facility];  Service: General;  Laterality: N/A;   MELANOMA EXCISION  2001   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  10/30/2015   Procedure: REMOVAL OF DISTAL STOMACH;  Surgeon: Judeth Horn, MD;  Location: [place];  Service: General;;   REPAIR OF PERFORATED ULCER N/A 10/30/2015   Procedure: REPAIR OF BLEEDING  ULCER;  Surgeon: Judeth Horn, MD;  Location: [place] [facility];  Service: General;  Laterality: N/A;   TUMOR EXCISION  2009   "back; fatty tumor"    There were no vitals filed for this visit.   Subjective Assessment - 04/18/20 0806    Subjective Patient feels like his back may be a little bter. It is a little sore this morning.    Pertinent History multiple hernias, Somach feeding tube, anemia, anxiety    How long can you sit comfortably? No pain    How long can you stand comfortably? pain when he stands for too long    How long can you walk comfortably? pain while walking longer distances    Diagnostic tests x-ray: mild degenerative changes    Patient Stated Goals to be able to be active     Currently in Pain? Yes    Pain Score 4     Pain Location Back    Pain Orientation Right;Left    Pain Descriptors / Indicators Aching    Pain Type Chronic pain    Pain Onset More than a month ago    Pain Frequency Constant    Aggravating Factors  standing and walking    Pain Relieving Factors heating pad    Effect of Pain on Daily Activities difficulty standing    Multiple Pain Sites No                             [facility] Adult PT Treatment/Exercise - 04/18/20 0001      Lumbar Exercises: Standing   Other Standing Lumbar Exercises scap retraction 2x10 red; shoulder extnesion red 2x10  Lumbar Exercises: Seated   Other Seated Lumbar Exercises bilateral ER red 2x10      Manual Therapy   Manual Therapy Joint mobilization;Manual Traction    Joint Mobilization PA Mobilizations from t-3 to T-12    Soft tissue mobilization triggerpoint release to para-spinals    Manual Traction gentle LAD bilateral with grade II and II LAD            Trigger Point Dry Needling - 04/18/20 0001    Consent Given? Yes    Education Handout Provided Yes    Muscles Treated Back/Hip Thoracic multifidi;Lumbar multifidi;Quadratus lumborum    Lumbar multifidi Response Twitch response elicited;Palpable increased muscle length    Quadratus Lumborum Response Twitch response elicited;Palpable increased muscle length    Thoracic multifidi response Twitch response elicited;Palpable increased muscle length                PT Education - 04/18/20 0808    Education Details reviewed HEp and symptom management    Person(s) Educated Patient    Methods Explanation;Demonstration;Tactile cues;Verbal cues    Comprehension Verbalized understanding;Returned demonstration;Verbal cues required;Tactile cues required            PT Short Term Goals - 04/03/20 1531      PT SHORT TERM GOAL #1   Title Pt will be indepenent in HEP as it has been established     Time 3    Period Weeks    Status  On-going      PT SHORT TERM GOAL #2   Title Patient will increase lumbar flexion by 20 degrees    Time 3    Period Weeks    Status New    Target Date 04/24/20      PT SHORT TERM GOAL #3   Title Patient will increase billateral LE strength to 5/5    Time 3    Period Weeks    Status New    Target Date 04/24/20             PT Long Term Goals - 04/03/20 1617      PT LONG TERM GOAL #1   Title Patient will perform ADL's and IADL's without pain in his back    Time 6    Period Weeks    Status New    Target Date 05/15/20      PT LONG TERM GOAL #2   Title Patient will ambualte 3000' without increased pain    Time 6    Period Weeks    Status New    Target Date 05/15/20                 Plan - 04/18/20 [id number]    Clinical Impression Statement Patient had less trigger points today then last visit. He still had spas ing in his lumbar spine and his QL today. Therapy needled his QL. We will continue to wrok on manual therapy to his spine and soft tissue mobilization. he was given standing band exercises today. he tolerated well.    Comorbidities anxiety, extensive histroy of stomach pain, feeding tube    Examination-Activity Limitations Carry;Lift;Squat;Stand    Stability/Clinical Decision Making Evolving/Moderate complexity    Rehab Potential Good    PT Frequency 2x / week    PT Duration 6 weeks    PT Treatment/Interventions ADLs/Self Care Home Management;Cryotherapy;Electrical Stimulation;Iontophoresis 4mg /ml Dexamethasone;Moist Heat;Ultrasound;Functional mobility training;Therapeutic activities;Therapeutic exercise;Balance training;Neuromuscular re-education;Patient/family education;Manual techniques;Passive range of motion;Dry needling;Taping    PT Next Visit Plan patient had positive  repsose to TPDN last episode; manual therapy to trigger points, review rotational stretching movements; Consider thoracic PA's; begin light core strengthening. Keep in mind he has had extensive  stomach surgery.    PT Home Exercise Plan LTR, open book, glute stretch; tennis ball trigger poitn release           Patient will benefit from skilled therapeutic intervention in order to improve the following deficits and impairments:  Increased fascial restricitons,Decreased range of motion,Increased muscle spasms,Pain,Decreased endurance,Decreased strength,Decreased mobility,Postural dysfunction  Visit Diagnosis: Chronic bilateral low back pain without sciatica  Pain in thoracic spine  Cramp and spasm  Abnormal posture     Problem List Patient Active Problem List   Diagnosis Date Noted   Bilateral inguinal hernia 09/28/2018   Incisional hernia 03/17/2018   Trigger thumb, left thumb 01/19/2018   Trigger finger, left index finger 07/18/2017   Trigger finger, right ring finger 01/31/2017   Trigger finger of left thumb 01/03/2017   Trigger finger of right thumb 01/03/2017   Trigger index finger of left hand 01/03/2017   Trigger index finger of right hand 01/03/2017   Malnutrition of moderate degree 07/19/2016   Intra-abdominal abscess ([place]) 07/16/2016   Cellulitis 07/15/2016   S/P colostomy takedown 06/28/2016   Pain in thoracic spine 06/09/2016   Mid back pain 06/09/2016   Postoperative fever 11/19/2015   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   Severe protein-calorie malnutrition ([place]) 11/17/2015   Sepsis ([place]) 11/14/2015   Hiccups 11/14/2015   AKI (acute kidney injury) ([place]) 11/14/2015   Fever    Leg swelling    Left shoulder pain    Muscle spasm of left shoulder    GI bleed 10/27/2015   Acute blood loss anemia 10/27/2015   Syncope 10/27/2015   Hyperglycemia 10/27/2015   Hypotension 10/27/2015   Neck pain 10/27/2015   Hematemesis 10/27/2015   Hematochezia 10/27/2015   Diverticulitis of colon with perforation 07/05/2015    Carney Living PT DPT  04/18/2020, 10:24 AM  [place] [facility] [address] [place], [name], [address] Phone: [phone]   Fax:  [phone]  Name: Miguel [name] MRN: [id number] Date of Birth: 03/03/1951

## 2020-04-22 ENCOUNTER — Encounter: Payer: Self-pay | Admitting: Orthopaedic Surgery

## 2020-04-22 ENCOUNTER — Ambulatory Visit (INDEPENDENT_AMBULATORY_CARE_PROVIDER_SITE_OTHER): Payer: Medicare Other | Admitting: Orthopaedic Surgery

## 2020-04-22 DIAGNOSIS — K432 Incisional hernia without obstruction or gangrene: Secondary | ICD-10-CM | POA: Diagnosis not present

## 2020-04-22 DIAGNOSIS — M545 Low back pain, unspecified: Secondary | ICD-10-CM | POA: Diagnosis not present

## 2020-04-22 NOTE — Progress Notes (Signed)
The patient comes in for follow-up after having physical therapy on his lumbar spine.  He says he is getting better with therapy and his pain is minimal at this standpoint.  He denies any radicular symptoms.  He is someone who has had extensive abdominal surgery before.  He is a cachectic individual and that may have affected his posture and his back in general.  He takes occasional tramadol and Tylenol for pain.  On exam he has negative straight leg raise bilaterally and excellent strength in his bilateral lower extremities.  There is some mild low back pain to the left side in the paraspinal muscles in the facet joint areas.  At this point follow-up can be as needed since he is doing well.  His biggest to where things are worse for him he will let us know.

## 2020-04-23 ENCOUNTER — Ambulatory Visit: Payer: Medicare Other | Admitting: Physical Therapy

## 2020-04-29 HISTORY — PX: OTHER SURGICAL HISTORY: SHX169

## 2020-04-30 ENCOUNTER — Other Ambulatory Visit: Payer: Self-pay

## 2020-04-30 ENCOUNTER — Encounter: Payer: Self-pay | Admitting: Physical Therapy

## 2020-04-30 ENCOUNTER — Ambulatory Visit: Payer: Medicare Other | Attending: Orthopaedic Surgery | Admitting: Physical Therapy

## 2020-04-30 DIAGNOSIS — R252 Cramp and spasm: Secondary | ICD-10-CM | POA: Insufficient documentation

## 2020-04-30 DIAGNOSIS — M546 Pain in thoracic spine: Secondary | ICD-10-CM | POA: Diagnosis not present

## 2020-04-30 DIAGNOSIS — M545 Low back pain, unspecified: Secondary | ICD-10-CM | POA: Diagnosis not present

## 2020-04-30 DIAGNOSIS — G8929 Other chronic pain: Secondary | ICD-10-CM | POA: Diagnosis not present

## 2020-04-30 DIAGNOSIS — R293 Abnormal posture: Secondary | ICD-10-CM | POA: Diagnosis not present

## 2020-05-01 DIAGNOSIS — K432 Incisional hernia without obstruction or gangrene: Secondary | ICD-10-CM | POA: Diagnosis not present

## 2020-05-01 NOTE — Therapy (Signed)
[place], [name], [address] Phone: [phone]   Fax:  [phone]  Physical Therapy Treatment  Patient Details  Name: Miguel [name] MRN: [id number] Date of Birth: 14-Jul-1950 Referring Provider (PT): Dr Jean Rosenthal   Encounter Date: 04/30/2020   PT End of Session - 04/30/20 1051    Visit Number 4    Number of Visits 12    Date for PT Re-Evaluation 05/15/20    PT Start Time [id number]    PT Stop Time 1058    PT Time Calculation (min) 43 min    Activity Tolerance Patient tolerated treatment well    Behavior During Therapy [facility] for tasks assessed/performed           Past Medical History:  Diagnosis Date  . Anemia   . Anxiety   . BPH (benign prostatic hyperplasia)   . Gastritis   . GERD (gastroesophageal reflux disease)    "seldom" (07/16/2016)  . GI bleed due to NSAIDs 10/27/2015  . History of blood transfusion 06/2016   post OR/notes 07/15/2016  . History of hiatal hernia   . History of kidney stones   . Hyperlipidemia   . Hypertension    no meds   . 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 Sigmoid diverticulitis    with perforation    Past Surgical History:  Procedure Laterality Date  . COLON SURGERY     sigmoid  . COLOSTOMY TAKEDOWN N/A 06/28/2016   Procedure: COLOSTOMY TAKEDOWN;  Surgeon: [name], MD;  Location: [place];  Service: General;  Laterality: N/A;  . CYSTOSCOPY WITH RETROGRADE PYELOGRAM, URETEROSCOPY AND STENT PLACEMENT Bilateral 02/12/2020   Procedure: CYSTOSCOPY WITH BILATERAL RETROGRADE PYELOGRAM,  [place];  Surgeon: Remi Haggard, MD;  Location: [facility];  Service: Urology;  Laterality: Bilateral;  1 HR  . ESOPHAGOGASTRODUODENOSCOPY N/A 10/27/2015   Procedure: ESOPHAGOGASTRODUODENOSCOPY (EGD);  Surgeon: Clarene Essex, MD;  Location: [facility] ENDOSCOPY;  Service: Endoscopy;  Laterality: N/A;  . ESOPHAGOGASTRODUODENOSCOPY N/A 10/29/2015   Procedure:  ESOPHAGOGASTRODUODENOSCOPY (EGD);  Surgeon: Ronald Lobo, MD;  Location[facility] ENDOSCOPY;  Service: Endoscopy;  Laterality: N/A;  . ESOPHAGOGASTRODUODENOSCOPY N/A 10/30/2015   Procedure: ESOPHAGOGASTRODUODENOSCOPY (EGD);  Surgeon: Clarene Essex, MD;  Location: [facility] ENDOSCOPY;  Service: Endoscopy;  Laterality: N/A;  . GASTROSTOMY TUBE PLACEMENT  11/21/2015   REDUCTION OF HIATAL HERNIA , REPAIR HIATAL HERNIA, RESECTION SMALL BOWEL WITH ANASTOMOSIS, PLACEMENT GASTROSTOMY TUBE, PLACEMENT DUODENOSTOMY TUBE (N/A)  . HEMORRHOID BANDING  X 2  . HERNIA REPAIR    . HIATAL HERNIA REPAIR N/A 11/21/2015   Procedure: REDUCTION OF HIATAL HERNIA , REPAIR HIATAL HERNIA, RESECTION SMALL BOWEL WITH ANASTOMOSIS, PLACEMENT GASTROSTOMY TUBE, PLACEMENT DUODENOSTOMY TUBE;  Surgeon: Mickeal Skinner, MD;  Location: [place];  Service: General;  Laterality: N/A;  . HOLMIUM LASER APPLICATION Right [id number]   Procedure: HOLMIUM LASER APPLICATION;  Surgeon: Remi Haggard, MD;  Location: [facility];  Service: Urology;  Laterality: Right;  . INCISIONAL HERNIA REPAIR  06/28/2016   open/notes 07/15/2016  . INCISIONAL HERNIA REPAIR  03/17/2018   WITH MESH  . INCISIONAL HERNIA REPAIR N/A 03/17/2018   Procedure: INCISIONAL HERNIA REPAIR WITH MESH;  Surgeon: [name], MD;  Location: [place];  Service: General;  Laterality: N/A;  . INGUINAL HERNIA REPAIR Bilateral 09/28/2018  . INGUINAL HERNIA REPAIR Bilateral 09/28/2018   Procedure: BILATERAL OPEN INGUINAL HERNIA REPAIR WITH MESH;  Surgeon: [name], MD;  Location: [place];  Service:  General;  Laterality: Bilateral;  GENERAL AND TAP BLOCK  . INSERTION OF MESH N/A 03/17/2018   Procedure: INSERTION OF MESH;  Surgeon: [name], MD;  Location: [place];  Service: General;  Laterality: N/A;  . INSERTION OF MESH Bilateral 09/28/2018   Procedure: Insertion Of Mesh;  Surgeon: [name], MD;  Location: [place];  Service: General;  Laterality: Bilateral;  . IR CM INJ ANY  COLONIC TUBE W/FLUORO  02/04/2017  . IR GUIDED DRAIN W CATHETER PLACEMENT  07/06/2016   /NOTES 07/15/2016  . IR PATIENT EVAL TECH 0-60 MINS  06/28/2019  . IR RADIOLOGIST EVAL & MGMT  07/27/2016  . IR RADIOLOGIST EVAL & MGMT  08/17/2016  . IR RADIOLOGIST EVAL & MGMT  08/26/2016  . IR REPLACE G-TUBE SIMPLE WO FLUORO  07/28/2017  . IR REPLACE G-TUBE SIMPLE WO FLUORO  01/31/2018  . IR REPLACE G-TUBE SIMPLE WO FLUORO  10/16/2018  . IR REPLACE G-TUBE SIMPLE WO FLUORO  03/05/2019  . IR REPLACE G-TUBE SIMPLE WO FLUORO  08/22/2019  . IR REPLACE G-TUBE SIMPLE WO FLUORO  01/08/2020  . IR [place] TUBE PERCUT W/FLUORO  08/04/2016  . IR [place] TUBE PERCUT W/FLUORO  01/14/2017  . IR US GUIDE BX ASP/DRAIN  08/04/2016  . KNEE CARTILAGE SURGERY Right 1971   "opened me up"  . LAPAROTOMY N/A 07/05/2015   Procedure: PARTIAL SIGMOID COLECTOMY AND COLOSTOMY;  Surgeon: [name], MD;  Location: [place];  Service: General;  Laterality: N/A;  . MELANOMA EXCISION  2001  . 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 REPAIR OF PERFORATED ULCER N/A 10/30/2015   Procedure: REPAIR OF BLEEDING  ULCER;  Surgeon: Judeth Horn, MD;  Location: [place];  Service: General;  Laterality: N/A;  . TUMOR EXCISION  2009   "back; fatty tumor"    There were no vitals filed for this visit.   Subjective Assessment - 04/30/20 1025    Subjective Patient had his feedin tube taken out. He feels some pain going across his back.    Pertinent History multiple hernias, [place] feeding tube, anemia, anxiety    How long can you sit comfortably? No pain    How long can you stand comfortably? pain when he stands for too long    How long can you walk comfortably? pain while walking longer distances    Diagnostic tests x-ray: mild degenerative changes    Currently in Pain? Yes    Pain Score 4     Pain  Location Back    Pain Orientation Right    Pain Descriptors / Indicators Aching    Pain Type Chronic pain    Pain Onset More than a month ago    Pain Frequency Constant    Aggravating Factors  standing and walking    Pain Relieving Factors heating pad    Effect of Pain on Daily Activities diffuclty                             [facility] Adult PT Treatment/Exercise - 05/01/20 0001      Lumbar Exercises: Stretches   Lower Trunk Rotation Limitations x20      Lumbar Exercises: Standing   Other Standing Lumbar Exercises scap retraction 2x10 red; shoulder extnesion red 2x10      Lumbar Exercises: Seated  Other Seated Lumbar Exercises bilateral ER red 2x10; gorizontal abduction redx20; wand flexion x20 reported pain in the spot we had been working on on in his back      Manual Therapy   Manual Therapy Joint mobilization;Manual Traction    Joint Mobilization PA Mobilizations from t-3 to T-12    Soft tissue mobilization triggerpoint release to para-spinals    Manual Traction gentle LAD bilateral with grade II and II LAD            Trigger Point Dry Needling - 05/01/20 0001    Consent Given? Yes    Education Handout Provided Yes    Muscles Treated Back/Hip Thoracic multifidi;Lumbar multifidi;Quadratus lumborum    Other Dry Needling 3 spots int he right thoracic mutifidi using a 25/30 needle    Thoracic multifidi response Twitch response elicited;Palpable increased muscle length                PT Education - 04/30/20 1050    Education Details reviewed the improtance of posterior chain strengthening    Person(s) Educated Patient    Methods Explanation;Demonstration;Tactile cues;Verbal cues    Comprehension Verbalized understanding;Verbal cues required;Returned demonstration;Tactile cues required            PT Short Term Goals - 04/03/20 1531      PT SHORT TERM GOAL #1   Title Pt will be indepenent in HEP as it has been established     Time 3    Period  Weeks    Status On-going      PT SHORT TERM GOAL #2   Title Patient will increase lumbar flexion by 20 degrees    Time 3    Period Weeks    Status New    Target Date 04/24/20      PT SHORT TERM GOAL #3   Title Patient will increase billateral LE strength to 5/5    Time 3    Period Weeks    Status New    Target Date 04/24/20             PT Long Term Goals - 04/03/20 1617      PT LONG TERM GOAL #1   Title Patient will perform ADL's and IADL's without pain in his back    Time 6    Period Weeks    Status New    Target Date 05/15/20      PT LONG TERM GOAL #2   Title Patient will ambualte 3000' without increased pain    Time 6    Period Weeks    Status New    Target Date 05/15/20                 Plan - 04/30/20 1052    Clinical Impression Statement Patient had a goodtiwtch respose in his mid throacic area. We continue to focus on amnual therapy to reduce spasming and postrual correction exercises. The exercises have focsued on the posterior chain strengthening. He is still having some drainage from where his tube was taken out. We will begin more anterior core strengthening when this drainage stops. He is making porogress. he is able to sleep at night without his heating pad and is having less intesne pain during the day.    Personal Factors and Comorbidities Comorbidity 1;Comorbidity 2;Comorbidity 3+    Comorbidities anxiety, extensive histroy of stomach pain, feeding tube    Examination-Activity Limitations Carry;Lift;Squat;Stand    Stability/Clinical Decision Making Evolving/Moderate complexity    Clinical Decision  Making High    Rehab Potential Good    PT Frequency 2x / week    PT Duration 6 weeks    PT Treatment/Interventions ADLs/Self Care Home Management;Cryotherapy;Electrical Stimulation;Iontophoresis 4mg /ml Dexamethasone;Moist Heat;Ultrasound;Functional mobility training;Therapeutic activities;Therapeutic exercise;Balance training;Neuromuscular  re-education;Patient/family education;Manual techniques;Passive range of motion;Dry needling;Taping    PT Next Visit Plan patient had positive repsose to TPDN last episode; manual therapy to trigger points, review rotational stretching movements; Consider thoracic PA's; begin light core strengthening. Keep in mind he has had extensive stomach surgery.    PT Home Exercise Plan LTR, open book, glute stretch; tennis ball trigger poitn release    Consulted and Agree with Plan of Care Patient           Patient will benefit from skilled therapeutic intervention in order to improve the following deficits and impairments:  Increased fascial restricitons,Decreased range of motion,Increased muscle spasms,Pain,Decreased endurance,Decreased strength,Decreased mobility,Postural dysfunction  Visit Diagnosis: Chronic bilateral low back pain without sciatica  Pain in thoracic spine  Cramp and spasm  Abnormal posture     Problem List Patient Active Problem List   Diagnosis Date Noted  . Bilateral inguinal hernia 09/28/2018  . Incisional hernia 03/17/2018  . Trigger thumb, left thumb 01/19/2018  . Trigger finger, left index finger 07/18/2017  . Trigger finger, right ring finger 01/31/2017  . Trigger finger of left thumb 01/03/2017  . Trigger finger of right thumb 01/03/2017  . Trigger index finger of left hand 01/03/2017  . Trigger index finger of right hand 01/03/2017  . Malnutrition of moderate degree 07/19/2016  . Intra-abdominal abscess ([place]) 07/16/2016  . Cellulitis 07/15/2016  . S/P colostomy takedown 06/28/2016  . Pain in thoracic spine 06/09/2016  . Mid back pain 06/09/2016  . Postoperative fever 11/19/2015  . 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 Severe protein-calorie malnutrition ([place]) 11/17/2015  . Sepsis ([place]) 11/14/2015  . Hiccups 11/14/2015  . AKI (acute kidney injury) ([place]) 11/14/2015  . Fever   . Leg swelling   . Left shoulder pain   . Muscle spasm of left shoulder    . GI bleed 10/27/2015  . Acute blood loss anemia 10/27/2015  . Syncope 10/27/2015  . Hyperglycemia 10/27/2015  . Hypotension 10/27/2015  . Neck pain 10/27/2015  . Hematemesis 10/27/2015  . Hematochezia 10/27/2015  . Diverticulitis of colon with perforation 07/05/2015    Carney Living PT DPT  05/01/2020, 9:13 AM  [facility] [address] [place], [name], [address] Phone: [phone]   Fax:  [phone]  Name: Miguel [name] MRN: [id number] Date of Birth: 02/06/1951

## 2020-05-07 ENCOUNTER — Encounter: Payer: Self-pay | Admitting: Physical Therapy

## 2020-05-07 ENCOUNTER — Ambulatory Visit: Payer: Medicare Other | Admitting: Physical Therapy

## 2020-05-07 ENCOUNTER — Other Ambulatory Visit: Payer: Self-pay

## 2020-05-07 DIAGNOSIS — R252 Cramp and spasm: Secondary | ICD-10-CM

## 2020-05-07 DIAGNOSIS — R293 Abnormal posture: Secondary | ICD-10-CM | POA: Diagnosis not present

## 2020-05-07 DIAGNOSIS — M545 Low back pain, unspecified: Secondary | ICD-10-CM

## 2020-05-07 DIAGNOSIS — G8929 Other chronic pain: Secondary | ICD-10-CM | POA: Diagnosis not present

## 2020-05-07 DIAGNOSIS — M546 Pain in thoracic spine: Secondary | ICD-10-CM

## 2020-05-08 ENCOUNTER — Encounter: Payer: Self-pay | Admitting: Physical Therapy

## 2020-05-08 NOTE — Therapy (Signed)
[place], [name], [address] Phone: [phone]   Fax:  [phone]  Physical Therapy Treatment  Patient Details  Name: Miguel [name] MRN: [id number] Date of Birth: 1950/11/05 Referring Provider (PT): Dr Jean Rosenthal   Encounter Date: 05/07/2020   PT End of Session - 05/07/20 1027    Visit Number 5    Number of Visits 12    Date for PT Re-Evaluation 05/15/20    PT Start Time 1018    PT Stop Time 1059    PT Time Calculation (min) 41 min    Activity Tolerance Patient tolerated treatment well    Behavior During Therapy [facility] for tasks assessed/performed           Past Medical History:  Diagnosis Date  . Anemia   . Anxiety   . BPH (benign prostatic hyperplasia)   . Gastritis   . GERD (gastroesophageal reflux disease)    "seldom" (07/16/2016)  . GI bleed due to NSAIDs 10/27/2015  . History of blood transfusion 06/2016   post OR/notes 07/15/2016  . History of hiatal hernia   . History of kidney stones   . Hyperlipidemia   . Hypertension    no meds   . Melanoma of back ([place])    "mid back"  . Sigmoid diverticulitis    with perforation    Past Surgical History:  Procedure Laterality Date  . COLON SURGERY     sigmoid  . COLOSTOMY TAKEDOWN N/A 06/28/2016   Procedure: COLOSTOMY TAKEDOWN;  Surgeon: [name], MD;  Location: [place];  Service: General;  Laterality: N/A;  . CYSTOSCOPY WITH RETROGRADE PYELOGRAM, URETEROSCOPY AND STENT PLACEMENT Bilateral 02/12/2020   Procedure: CYSTOSCOPY WITH BILATERAL RETROGRADE PYELOGRAM,  [place];  Surgeon: Remi Haggard, MD;  [facility];  Service: Urology;  Laterality: Bilateral;  1 HR  . ESOPHAGOGASTRODUODENOSCOPY N/A 10/27/2015   Procedure: ESOPHAGOGASTRODUODENOSCOPY (EGD);  Surgeon: Clarene Essex, MD;  Location: [facility] ENDOSCOPY;  Service: Endoscopy;  Laterality: N/A;  . ESOPHAGOGASTRODUODENOSCOPY N/A 10/29/2015   Procedure:  ESOPHAGOGASTRODUODENOSCOPY (EGD);  Surgeon: Ronald Lobo, MD;  Location: [facility] ENDOSCOPY;  Service: Endoscopy;  Laterality: N/A;  . ESOPHAGOGASTRODUODENOSCOPY N/A 10/30/2015   Procedure: ESOPHAGOGASTRODUODENOSCOPY (EGD);  Surgeon: Clarene Essex, MD;  Location: [facility] ENDOSCOPY;  Service: Endoscopy;  Laterality: N/A;  . GASTROSTOMY TUBE PLACEMENT  11/21/2015   REDUCTION OF HIATAL HERNIA , REPAIR HIATAL HERNIA, RESECTION SMALL BOWEL WITH ANASTOMOSIS, PLACEMENT GASTROSTOMY TUBE, PLACEMENT DUODENOSTOMY TUBE (N/A)  . HEMORRHOID BANDING  X 2  . HERNIA REPAIR    . HIATAL HERNIA REPAIR N/A 11/21/2015   Procedure: REDUCTION OF HIATAL HERNIA , REPAIR HIATAL HERNIA, RESECTION SMALL BOWEL WITH ANASTOMOSIS, PLACEMENT GASTROSTOMY TUBE, PLACEMENT DUODENOSTOMY TUBE;  Surgeon: Mickeal Skinner, MD;  Location: [place];  Service: General;  Laterality: N/A;  . HOLMIUM LASER APPLICATION Right 65/46/5035   Procedure: HOLMIUM LASER APPLICATION;  Surgeon: Remi Haggard, MD;  [facility];  Service: Urology;  Laterality: Right;  . INCISIONAL HERNIA REPAIR  06/28/2016   open/notes 07/15/2016  . INCISIONAL HERNIA REPAIR  03/17/2018   WITH MESH  . INCISIONAL HERNIA REPAIR N/A 03/17/2018   Procedure: INCISIONAL HERNIA REPAIR WITH MESH;  Surgeon: [name], MD;  Location: [place];  Service: General;  Laterality: N/A;  . INGUINAL HERNIA REPAIR Bilateral 09/28/2018  . INGUINAL HERNIA REPAIR Bilateral 09/28/2018   Procedure: BILATERAL OPEN INGUINAL HERNIA REPAIR WITH MESH;  Surgeon: [name], MD;  Location: [place];  Service:  General;  Laterality: Bilateral;  GENERAL AND TAP BLOCK  . INSERTION OF MESH N/A 03/17/2018   Procedure: INSERTION OF MESH;  Surgeon: [name], MD;  Location: [place];  Service: General;  Laterality: N/A;  . INSERTION OF MESH Bilateral 09/28/2018   Procedure: Insertion Of Mesh;  Surgeon: [name], MD;  Location: [place];  Service: General;  Laterality: Bilateral;  . IR CM INJ ANY  COLONIC TUBE W/FLUORO  02/04/2017  . IR GUIDED DRAIN W CATHETER PLACEMENT  07/06/2016   /NOTES 07/15/2016  . IR PATIENT EVAL TECH 0-60 MINS  06/28/2019  . IR RADIOLOGIST EVAL & MGMT  07/27/2016  . IR RADIOLOGIST EVAL & MGMT  08/17/2016  . IR RADIOLOGIST EVAL & MGMT  08/26/2016  . IR REPLACE G-TUBE SIMPLE WO FLUORO  07/28/2017  . IR REPLACE G-TUBE SIMPLE WO FLUORO  01/31/2018  . IR REPLACE G-TUBE SIMPLE WO FLUORO  10/16/2018  . IR REPLACE G-TUBE SIMPLE WO FLUORO  03/05/2019  . IR REPLACE G-TUBE SIMPLE WO FLUORO  08/22/2019  . IR REPLACE G-TUBE SIMPLE WO FLUORO  01/08/2020  . IR [place] TUBE PERCUT W/FLUORO  08/04/2016  . IR [place] TUBE PERCUT W/FLUORO  01/14/2017  . IR US GUIDE BX ASP/DRAIN  08/04/2016  . KNEE CARTILAGE SURGERY Right 1971   "opened me up"  . LAPAROTOMY N/A 07/05/2015   Procedure: PARTIAL SIGMOID COLECTOMY AND COLOSTOMY;  Surgeon: [name], MD;  Location: [place];  Service: General;  Laterality: N/A;  . MELANOMA EXCISION  2001  . 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 REPAIR OF PERFORATED ULCER N/A 10/30/2015   Procedure: REPAIR OF BLEEDING  ULCER;  Surgeon: Judeth Horn, MD;  Location: [place];  Service: General;  Laterality: N/A;  . TUMOR EXCISION  2009   "back; fatty tumor"    There were no vitals filed for this visit.   Subjective Assessment - 05/07/20 1024    Subjective Patient reports his back has been feeling pretty good. He is still having drainage from where the tube was removed. He is standing straighter coming in.    Pertinent History multiple hernias, Somach feeding tube, anemia, anxiety    How long can you sit comfortably? No pain    How long can you stand comfortably? pain when he stands for too long    How long can you walk comfortably? pain while walking longer distances    Diagnostic tests x-ray: mild  degenerative changes    Patient Stated Goals to be able to be active    Currently in Pain? No/denies                             [facility] Adult PT Treatment/Exercise - 05/08/20 0001      Lumbar Exercises: Stretches   Lower Trunk Rotation Limitations x20    Other Lumbar Stretch Exercise reviewed posteriro capsuel stretch with twits for mid throacic      Lumbar Exercises: Standing   Other Standing Lumbar Exercises scap retraction 2x10 red; shoulder extnesion red 2x10      Lumbar Exercises: Seated   Other Seated Lumbar Exercises bilateral ER red 2x10; gorizontal abduction redx20; wand flexion x20 reported pain in the spot we had been working on on in his back      Manual Therapy  Manual Therapy Joint mobilization;Manual Traction    Joint Mobilization PA Mobilizations from t-3 to T-12    Soft tissue mobilization triggerpoint release to para-spinals    Manual Traction gentle LAD bilateral with grade II and II LAD            Trigger Point Dry Needling - 05/08/20 0001    Consent Given? Yes    Education Handout Provided Yes    Other Dry Needling focused on the left side today 4 spots along the left mid thoracic area    Thoracic multifidi response Twitch response elicited;Palpable increased muscle length                PT Education - 05/07/20 1026    Education Details reveiwed light strengthening exercises    Person(s) Educated Patient    Methods Explanation;Demonstration;Tactile cues;Verbal cues    Comprehension Verbalized understanding;Returned demonstration;Verbal cues required;Tactile cues required            PT Short Term Goals - 04/03/20 1531      PT SHORT TERM GOAL #1   Title Pt will be indepenent in HEP as it has been established     Time 3    Period Weeks    Status On-going      PT SHORT TERM GOAL #2   Title Patient will increase lumbar flexion by 20 degrees    Time 3    Period Weeks    Status New    Target Date 04/24/20      PT SHORT  TERM GOAL #3   Title Patient will increase billateral LE strength to 5/5    Time 3    Period Weeks    Status New    Target Date 04/24/20             PT Long Term Goals - 04/03/20 1617      PT LONG TERM GOAL #1   Title Patient will perform ADL's and IADL's without pain in his back    Time 6    Period Weeks    Status New    Target Date 05/15/20      PT LONG TERM GOAL #2   Title Patient will ambualte 3000' without increased pain    Time 6    Period Weeks    Status New    Target Date 05/15/20                 Plan - 05/08/20 1216    Clinical Impression Statement Patient continues to have leckage from his feeding tube. We continue to focuds more on posterior chain strengthening then core strengthening until the point where his wounmd heals. We continue to also focus on manual therapy to the thoracic spine as well as nbeedling. He is sleeping better and having less pain with activity. We will continue to progress as tolerated.    Personal Factors and Comorbidities Comorbidity 1;Comorbidity 2;Comorbidity 3+    Comorbidities anxiety, extensive histroy of stomach pain, feeding tube    Examination-Activity Limitations Carry;Lift;Squat;Stand    Stability/Clinical Decision Making Evolving/Moderate complexity    Clinical Decision Making High    Rehab Potential Good    PT Frequency 2x / week    PT Duration 6 weeks    PT Treatment/Interventions ADLs/Self Care Home Management;Cryotherapy;Electrical Stimulation;Iontophoresis 4mg /ml Dexamethasone;Moist Heat;Ultrasound;Functional mobility training;Therapeutic activities;Therapeutic exercise;Balance training;Neuromuscular re-education;Patient/family education;Manual techniques;Passive range of motion;Dry needling;Taping    PT Next Visit Plan patient had positive repsose to TPDN last episode; manual therapy to trigger  points, review rotational stretching movements; Consider thoracic PA's; begin light core strengthening. Keep in mind he has  had extensive stomach surgery.    PT Home Exercise Plan LTR, open book, glute stretch; tennis ball trigger poitn release    Consulted and Agree with Plan of Care Patient           Patient will benefit from skilled therapeutic intervention in order to improve the following deficits and impairments:  Increased fascial restricitons,Decreased range of motion,Increased muscle spasms,Pain,Decreased endurance,Decreased strength,Decreased mobility,Postural dysfunction  Visit Diagnosis: Chronic bilateral low back pain without sciatica  Pain in thoracic spine  Cramp and spasm  Abnormal posture     Problem List Patient Active Problem List   Diagnosis Date Noted  . Bilateral inguinal hernia 09/28/2018  . Incisional hernia 03/17/2018  . Trigger thumb, left thumb 01/19/2018  . Trigger finger, left index finger 07/18/2017  . Trigger finger, right ring finger 01/31/2017  . Trigger finger of left thumb 01/03/2017  . Trigger finger of right thumb 01/03/2017  . Trigger index finger of left hand 01/03/2017  . Trigger index finger of right hand 01/03/2017  . Malnutrition of moderate degree 07/19/2016  . Intra-abdominal abscess ([place]) 07/16/2016  . Cellulitis 07/15/2016  . S/P colostomy takedown 06/28/2016  . Pain in thoracic spine 06/09/2016  . Mid back pain 06/09/2016  . Postoperative fever 11/19/2015  . 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 Severe protein-calorie malnutrition ([place]) 11/17/2015  . Sepsis ([place]) 11/14/2015  . Hiccups 11/14/2015  . AKI (acute kidney injury) ([place]) 11/14/2015  . Fever   . Leg swelling   . Left shoulder pain   . Muscle spasm of left shoulder   . GI bleed 10/27/2015  . Acute blood loss anemia 10/27/2015  . Syncope 10/27/2015  . Hyperglycemia 10/27/2015  . Hypotension 10/27/2015  . Neck pain 10/27/2015  . Hematemesis 10/27/2015  . Hematochezia 10/27/2015  . Diverticulitis of colon with perforation 07/05/2015    Carney Living PT DPT  05/08/2020, 12:28  PM  [facility] [address] [place], [name], [address] Phone: [phone]   Fax:  [phone]  Name: Miguel [name] MRN: [id number] Date of Birth: [birth year]

## 2020-05-14 ENCOUNTER — Other Ambulatory Visit: Payer: Self-pay

## 2020-05-14 ENCOUNTER — Encounter: Payer: Self-pay | Admitting: Physical Therapy

## 2020-05-14 ENCOUNTER — Ambulatory Visit: Payer: Medicare Other | Admitting: Physical Therapy

## 2020-05-14 DIAGNOSIS — R293 Abnormal posture: Secondary | ICD-10-CM

## 2020-05-14 DIAGNOSIS — R252 Cramp and spasm: Secondary | ICD-10-CM | POA: Diagnosis not present

## 2020-05-14 DIAGNOSIS — G8929 Other chronic pain: Secondary | ICD-10-CM | POA: Diagnosis not present

## 2020-05-14 DIAGNOSIS — M546 Pain in thoracic spine: Secondary | ICD-10-CM

## 2020-05-14 DIAGNOSIS — M545 Low back pain, unspecified: Secondary | ICD-10-CM | POA: Diagnosis not present

## 2020-05-14 NOTE — Therapy (Signed)
[place], [name], [address] Phone: [phone]   Fax:  [phone]  Physical Therapy Treatment  Patient Details  Name: Miguel [name] MRN: [id number] Date of Birth: 21-Feb-1951 Referring Provider (PT): Dr Jean Rosenthal   Encounter Date: 05/14/2020   PT End of Session - 05/14/20 1058    Visit Number 6    Number of Visits 12    Date for PT Re-Evaluation 05/15/20    PT Start Time 7106    PT Stop Time 1059    PT Time Calculation (min) 44 min    Activity Tolerance Patient tolerated treatment well    Behavior During Therapy [facility] for tasks assessed/performed           Past Medical History:  Diagnosis Date  . Anemia   . Anxiety   . BPH (benign prostatic hyperplasia)   . Gastritis   . GERD (gastroesophageal reflux disease)    "seldom" (07/16/2016)  . GI bleed due to NSAIDs 10/27/2015  . History of blood transfusion 06/2016   post OR/notes 07/15/2016  . History of hiatal hernia   . History of kidney stones   . Hyperlipidemia   . Hypertension    no meds   . Melanoma of back ([place])    "mid back"  . Sigmoid diverticulitis    with perforation    Past Surgical History:  Procedure Laterality Date  . COLON SURGERY     sigmoid  . COLOSTOMY TAKEDOWN N/A 06/28/2016   Procedure: COLOSTOMY TAKEDOWN;  Surgeon: [name], MD;  Location: [place];  Service: General;  Laterality: N/A;  . CYSTOSCOPY WITH RETROGRADE PYELOGRAM, URETEROSCOPY AND STENT PLACEMENT Bilateral 02/12/2020   Procedure: CYSTOSCOPY WITH BILATERAL RETROGRADE PYELOGRAM,  [place];  Surgeon: Remi Haggard, MD;  [facility];  Service: Urology;  Laterality: Bilateral;  1 HR  . ESOPHAGOGASTRODUODENOSCOPY N/A 10/27/2015   Procedure: ESOPHAGOGASTRODUODENOSCOPY (EGD);  Surgeon: Clarene Essex, MD;  [facility] ENDOSCOPY;  Service: Endoscopy;  Laterality: N/A;  . ESOPHAGOGASTRODUODENOSCOPY N/A 10/29/2015   Procedure:  ESOPHAGOGASTRODUODENOSCOPY (EGD);  Surgeon: Ronald Lobo, MD;  Location: [facility] ENDOSCOPY;  Service: Endoscopy;  Laterality: N/A;  . ESOPHAGOGASTRODUODENOSCOPY N/A 10/30/2015   Procedure: ESOPHAGOGASTRODUODENOSCOPY (EGD);  Surgeon: Clarene Essex, MD;  Location: [facility] ENDOSCOPY;  Service: Endoscopy;  Laterality: N/A;  . GASTROSTOMY TUBE PLACEMENT  11/21/2015   REDUCTION OF HIATAL HERNIA , REPAIR HIATAL HERNIA, RESECTION SMALL BOWEL WITH ANASTOMOSIS, PLACEMENT GASTROSTOMY TUBE, PLACEMENT DUODENOSTOMY TUBE (N/A)  . HEMORRHOID BANDING  X 2  . HERNIA REPAIR    . HIATAL HERNIA REPAIR N/A 11/21/2015   Procedure: REDUCTION OF HIATAL HERNIA , REPAIR HIATAL HERNIA, RESECTION SMALL BOWEL WITH ANASTOMOSIS, PLACEMENT GASTROSTOMY TUBE, PLACEMENT DUODENOSTOMY TUBE;  Surgeon: Mickeal Skinner, MD;  Location: [place];  Service: General;  Laterality: N/A;  . HOLMIUM LASER APPLICATION Right 26/94/8546   Procedure: HOLMIUM LASER APPLICATION;  Surgeon: Remi Haggard, MD;  [facility];  Service: Urology;  Laterality: Right;  . INCISIONAL HERNIA REPAIR  06/28/2016   open/notes 07/15/2016  . INCISIONAL HERNIA REPAIR  03/17/2018   WITH MESH  . INCISIONAL HERNIA REPAIR N/A 03/17/2018   Procedure: INCISIONAL HERNIA REPAIR WITH MESH;  Surgeon: [name], MD;  Location: [place];  Service: General;  Laterality: N/A;  . INGUINAL HERNIA REPAIR Bilateral 09/28/2018  . INGUINAL HERNIA REPAIR Bilateral 09/28/2018   Procedure: BILATERAL OPEN INGUINAL HERNIA REPAIR WITH MESH;  Surgeon: [name], MD;  Location: [place];  Service:  General;  Laterality: Bilateral;  GENERAL AND TAP BLOCK  . INSERTION OF MESH N/A 03/17/2018   Procedure: INSERTION OF MESH;  Surgeon: [name], MD;  Location: [place];  Service: General;  Laterality: N/A;  . INSERTION OF MESH Bilateral 09/28/2018   Procedure: Insertion Of Mesh;  Surgeon: [name], MD;  Location: [place];  Service: General;  Laterality: Bilateral;  . IR CM INJ ANY  COLONIC TUBE W/FLUORO  02/04/2017  . IR GUIDED DRAIN W CATHETER PLACEMENT  07/06/2016   /NOTES 07/15/2016  . IR PATIENT EVAL TECH 0-60 MINS  06/28/2019  . IR RADIOLOGIST EVAL & MGMT  07/27/2016  . IR RADIOLOGIST EVAL & MGMT  08/17/2016  . IR RADIOLOGIST EVAL & MGMT  08/26/2016  . IR REPLACE G-TUBE SIMPLE WO FLUORO  07/28/2017  . IR REPLACE G-TUBE SIMPLE WO FLUORO  01/31/2018  . IR REPLACE G-TUBE SIMPLE WO FLUORO  10/16/2018  . IR REPLACE G-TUBE SIMPLE WO FLUORO  03/05/2019  . IR REPLACE G-TUBE SIMPLE WO FLUORO  08/22/2019  . IR REPLACE G-TUBE SIMPLE WO FLUORO  01/08/2020  . IR [place] TUBE PERCUT W/FLUORO  08/04/2016  . IR [place] TUBE PERCUT W/FLUORO  01/14/2017  . IR US GUIDE BX ASP/DRAIN  08/04/2016  . KNEE CARTILAGE SURGERY Right 1971   "opened me up"  . LAPAROTOMY N/A 07/05/2015   Procedure: PARTIAL SIGMOID COLECTOMY AND COLOSTOMY;  Surgeon: [name], MD;  Location: [place];  Service: General;  Laterality: N/A;  . MELANOMA EXCISION  2001  . 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  10/30/2015   Procedure: REMOVAL OF DISTAL STOMACH;  Surgeon: Judeth Horn, MD;  Location: [place];  Service: General;;  . REPAIR OF PERFORATED ULCER N/A 10/30/2015   Procedure: REPAIR OF BLEEDING  ULCER;  Surgeon: Judeth Horn, MD;  Location: [place];  Service: General;  Laterality: N/A;  . TUMOR EXCISION  2009   "back; fatty tumor"    There were no vitals filed for this visit.   Subjective Assessment - 05/14/20 1056    Subjective Patient reports he woke up this morning with right sided low back pain. He also had it last night before he went to bed.    Pertinent History multiple hernias, Somach feeding tube, anemia, anxiety    How long can you sit comfortably? No pain    How long can you stand comfortably? pain when he stands for too long    How long can you walk comfortably? pain while walking longer distances    Diagnostic tests x-ray: mild degenerative changes    Patient Stated  Goals to be able to be active    Currently in Pain? Yes    Pain Score 4     Pain Location Back    Pain Orientation Right    Pain Descriptors / Indicators Aching    Pain Type Chronic pain    Pain Onset More than a month ago    Pain Frequency Constant    Aggravating Factors  just came on    Pain Relieving Factors volatrin    Effect of Pain on Daily Activities pain with activity                             [facility] Adult PT Treatment/Exercise - 05/14/20 0001      Lumbar Exercises: Seated   Other Seated Lumbar Exercises bilateral ER red 2x10; Horizontal abduction redx20; wand flexion  x20 improved pain today      Lumbar Exercises: Supine   Other Supine Lumbar Exercises supine march with abdomoinal brace x20; clamshell with abdominal brace x20 red      Manual Therapy   Manual Therapy Joint mobilization;Manual Traction    Joint Mobilization PA Mobilizations from t-3 to T-12    Soft tissue mobilization triggerpoint release to para-spinals    Manual Traction gentle LAD bilateral with grade II and II LAD            Trigger Point Dry Needling - 05/14/20 0001    Consent Given? Yes    Education Handout Provided Yes    Other Dry Needling 2 spots in left lumbar paraspinal and left thoiracic upper paraspinal using a 30/30 needle    Lumbar multifidi Response Twitch response elicited    Thoracic multifidi response Twitch response elicited;Palpable increased muscle length                PT Education - 05/14/20 1058    Education Details reviewed supine exercises    Person(s) Educated Patient    Methods Explanation;Demonstration;Tactile cues;Verbal cues    Comprehension Verbalized understanding;Returned demonstration;Verbal cues required;Tactile cues required            PT Short Term Goals - 04/03/20 1531      PT SHORT TERM GOAL #1   Title Pt will be indepenent in HEP as it has been established     Time 3    Period Weeks    Status On-going      PT SHORT  TERM GOAL #2   Title Patient will increase lumbar flexion by 20 degrees    Time 3    Period Weeks    Status New    Target Date 04/24/20      PT SHORT TERM GOAL #3   Title Patient will increase billateral LE strength to 5/5    Time 3    Period Weeks    Status New    Target Date 04/24/20             PT Long Term Goals - 04/03/20 1617      PT LONG TERM GOAL #1   Title Patient will perform ADL's and IADL's without pain in his back    Time 6    Period Weeks    Status New    Target Date 05/15/20      PT LONG TERM GOAL #2   Title Patient will ambualte 3000' without increased pain    Time 6    Period Weeks    Status New    Target Date 05/15/20                 Plan - 05/14/20 1109    Clinical Impression Statement Patient had increased tightnes in his left lower lumbar spine today. he tolerated nbeedling ewell. Therapy continues to focus on manual therapy to histhoroiac spine and lumbar spine. He was also given supine exercises. He was advised to make therapy aware if he has any increase in leackage.    Personal Factors and Comorbidities Comorbidity 1;Comorbidity 2;Comorbidity 3+    Comorbidities anxiety, extensive histroy of stomach pain, feeding tube    Examination-Activity Limitations Carry;Lift;Squat;Stand    Stability/Clinical Decision Making Evolving/Moderate complexity    Clinical Decision Making High    Rehab Potential Good    PT Frequency 2x / week    PT Duration 6 weeks    PT Treatment/Interventions ADLs/Self Care Home Management;Cryotherapy;Dealer  Stimulation;Iontophoresis 4mg /ml Dexamethasone;Moist Heat;Ultrasound;Functional mobility training;Therapeutic activities;Therapeutic exercise;Balance training;Neuromuscular re-education;Patient/family education;Manual techniques;Passive range of motion;Dry needling;Taping    PT Next Visit Plan patient had positive repsose to TPDN last episode; manual therapy to trigger points, review rotational stretching  movements; Consider thoracic PA's; begin light core strengthening. Keep in mind he has had extensive stomach surgery.    PT Home Exercise Plan LTR, open book, glute stretch; tennis ball trigger poitn release    Consulted and Agree with Plan of Care Patient           Patient will benefit from skilled therapeutic intervention in order to improve the following deficits and impairments:  Increased fascial restricitons,Decreased range of motion,Increased muscle spasms,Pain,Decreased endurance,Decreased strength,Decreased mobility,Postural dysfunction  Visit Diagnosis: Chronic bilateral low back pain without sciatica  Pain in thoracic spine  Cramp and spasm  Abnormal posture     Problem List Patient Active Problem List   Diagnosis Date Noted  . Bilateral inguinal hernia 09/28/2018  . Incisional hernia 03/17/2018  . Trigger thumb, left thumb 01/19/2018  . Trigger finger, left index finger 07/18/2017  . Trigger finger, right ring finger 01/31/2017  . Trigger finger of left thumb 01/03/2017  . Trigger finger of right thumb 01/03/2017  . Trigger index finger of left hand 01/03/2017  . Trigger index finger of right hand 01/03/2017  . Malnutrition of moderate degree 07/19/2016  . Intra-abdominal abscess ([place]) 07/16/2016  . Cellulitis 07/15/2016  . S/P colostomy takedown 06/28/2016  . Pain in thoracic spine 06/09/2016  . Mid back pain 06/09/2016  . Postoperative fever 11/19/2015  . 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 Severe protein-calorie malnutrition ([place]) 11/17/2015  . Sepsis ([place]) 11/14/2015  . Hiccups 11/14/2015  . AKI (acute kidney injury) ([place]) 11/14/2015  . Fever   . Leg swelling   . Left shoulder pain   . Muscle spasm of left shoulder   . GI bleed 10/27/2015  . Acute blood loss anemia 10/27/2015  . Syncope 10/27/2015  . Hyperglycemia 10/27/2015  . Hypotension 10/27/2015  . Neck pain 10/27/2015  . Hematemesis 10/27/2015  . Hematochezia 10/27/2015  .  Diverticulitis of colon with perforation 07/05/2015    Carney Living PT DPT  05/14/2020, 3:31 PM  [facility] [address] [place], [name], [address] Phone: [phone]   Fax:  [phone]  Name: Miguel [name] MRN: [id number] Date of Birth: [birth year]

## 2020-05-21 ENCOUNTER — Encounter: Payer: Self-pay | Admitting: Physical Therapy

## 2020-05-21 ENCOUNTER — Ambulatory Visit: Payer: Medicare Other | Admitting: Physical Therapy

## 2020-05-21 ENCOUNTER — Other Ambulatory Visit: Payer: Self-pay

## 2020-05-21 DIAGNOSIS — R252 Cramp and spasm: Secondary | ICD-10-CM | POA: Diagnosis not present

## 2020-05-21 DIAGNOSIS — G8929 Other chronic pain: Secondary | ICD-10-CM

## 2020-05-21 DIAGNOSIS — M546 Pain in thoracic spine: Secondary | ICD-10-CM

## 2020-05-21 DIAGNOSIS — M545 Low back pain, unspecified: Secondary | ICD-10-CM

## 2020-05-21 DIAGNOSIS — R293 Abnormal posture: Secondary | ICD-10-CM

## 2020-05-21 NOTE — Therapy (Signed)
[place], [name], [address] Phone: [phone]   Fax:  [phone]  Physical Therapy Treatment  Patient Details  Name: Miguel [name] MRN: [id number] Date of Birth: [birth year] Referring Provider (PT): Dr Jean Rosenthal   Encounter Date: 05/21/2020   PT End of Session - 05/21/20 1050    Visit Number 7    Number of Visits 13    Date for PT Re-Evaluation 07/02/20    Authorization Type Medicare progress note done on vivit 7. Next to be done on 17    PT Start Time 1015    PT Stop Time 1058    PT Time Calculation (min) 43 min    Activity Tolerance Patient tolerated treatment well    Behavior During Therapy WFL for tasks assessed/performed           Past Medical History:  Diagnosis Date  . Anemia   . Anxiety   . BPH (benign prostatic hyperplasia)   . Gastritis   . GERD (gastroesophageal reflux disease)    "seldom" (07/16/2016)  . GI bleed due to NSAIDs 10/27/2015  . History of blood transfusion 06/2016   post OR/notes 07/15/2016  . History of hiatal hernia   . History of kidney stones   . Hyperlipidemia   . Hypertension    no meds   . Melanoma of back ([place])    "mid back"  . Sigmoid diverticulitis    with perforation    Past Surgical History:  Procedure Laterality Date  . COLON SURGERY     sigmoid  . COLOSTOMY TAKEDOWN N/A 06/28/2016   Procedure: COLOSTOMY TAKEDOWN;  Surgeon: [name], MD;  Location: [place];  Service: General;  Laterality: N/A;  . CYSTOSCOPY WITH RETROGRADE PYELOGRAM, URETEROSCOPY AND STENT PLACEMENT Bilateral 02/12/2020   Procedure: CYSTOSCOPY WITH BILATERAL RETROGRADE PYELOGRAM,  [place];  Surgeon: Remi Haggard, MD;  [facility];  Service: Urology;  Laterality: Bilateral;  1 HR  . ESOPHAGOGASTRODUODENOSCOPY N/A 10/27/2015   Procedure: ESOPHAGOGASTRODUODENOSCOPY (EGD);  Surgeon: Clarene Essex, MD;  Location: [facility] ENDOSCOPY;  Service: Endoscopy;   Laterality: N/A;  . ESOPHAGOGASTRODUODENOSCOPY N/A 10/29/2015   Procedure: ESOPHAGOGASTRODUODENOSCOPY (EGD);  Surgeon: Ronald Lobo, MD;  Location: [facility] ENDOSCOPY;  Service: Endoscopy;  Laterality: N/A;  . ESOPHAGOGASTRODUODENOSCOPY N/A 10/30/2015   Procedure: ESOPHAGOGASTRODUODENOSCOPY (EGD);  Surgeon: Clarene Essex, MD;  Location: [facility] ENDOSCOPY;  Service: Endoscopy;  Laterality: N/A;  . GASTROSTOMY TUBE PLACEMENT  11/21/2015   REDUCTION OF HIATAL HERNIA , REPAIR HIATAL HERNIA, RESECTION SMALL BOWEL WITH ANASTOMOSIS, PLACEMENT GASTROSTOMY TUBE, PLACEMENT DUODENOSTOMY TUBE (N/A)  . HEMORRHOID BANDING  X 2  . HERNIA REPAIR    . HIATAL HERNIA REPAIR N/A 11/21/2015   Procedure: REDUCTION OF HIATAL HERNIA , REPAIR HIATAL HERNIA, RESECTION SMALL BOWEL WITH ANASTOMOSIS, PLACEMENT GASTROSTOMY TUBE, PLACEMENT DUODENOSTOMY TUBE;  Surgeon: Mickeal Skinner, MD;  Location: [place];  Service: General;  Laterality: N/A;  . HOLMIUM LASER APPLICATION Right 16/09/3708   Procedure: HOLMIUM LASER APPLICATION;  Surgeon: Remi Haggard, MD;  [facility];  Service: Urology;  Laterality: Right;  . INCISIONAL HERNIA REPAIR  06/28/2016   open/notes 07/15/2016  . INCISIONAL HERNIA REPAIR  03/17/2018   WITH MESH  . INCISIONAL HERNIA REPAIR N/A 03/17/2018   Procedure: INCISIONAL HERNIA REPAIR WITH MESH;  Surgeon: [name], MD;  Location: [place];  Service: General;  Laterality: N/A;  . INGUINAL HERNIA REPAIR Bilateral 09/28/2018  . INGUINAL HERNIA REPAIR Bilateral 09/28/2018   Procedure:  BILATERAL OPEN INGUINAL HERNIA REPAIR WITH MESH;  Surgeon: [name], MD;  Location: [place];  Service: General;  Laterality: Bilateral;  GENERAL AND TAP BLOCK  . INSERTION OF MESH N/A 03/17/2018   Procedure: INSERTION OF MESH;  Surgeon: [name], MD;  Location: [place];  Service: General;  Laterality: N/A;  . INSERTION OF MESH Bilateral 09/28/2018   Procedure: Insertion Of Mesh;  Surgeon: [name], MD;   Location: [place];  Service: General;  Laterality: Bilateral;  . IR CM INJ ANY COLONIC TUBE W/FLUORO  02/04/2017  . IR GUIDED DRAIN W CATHETER PLACEMENT  07/06/2016   /NOTES 07/15/2016  . IR PATIENT EVAL TECH 0-60 MINS  06/28/2019  . IR RADIOLOGIST EVAL & MGMT  07/27/2016  . IR RADIOLOGIST EVAL & MGMT  08/17/2016  . IR RADIOLOGIST EVAL & MGMT  08/26/2016  . IR REPLACE G-TUBE SIMPLE WO FLUORO  07/28/2017  . IR REPLACE G-TUBE SIMPLE WO FLUORO  01/31/2018  . IR REPLACE G-TUBE SIMPLE WO FLUORO  10/16/2018  . IR REPLACE G-TUBE SIMPLE WO FLUORO  03/05/2019  . IR REPLACE G-TUBE SIMPLE WO FLUORO  08/22/2019  . IR REPLACE G-TUBE SIMPLE WO FLUORO  01/08/2020  . IR [place] TUBE PERCUT W/FLUORO  08/04/2016  . IR [place] TUBE PERCUT W/FLUORO  01/14/2017  . IR US GUIDE BX ASP/DRAIN  08/04/2016  . KNEE CARTILAGE SURGERY Right 1971   "opened me up"  . LAPAROTOMY N/A 07/05/2015   Procedure: PARTIAL SIGMOID COLECTOMY AND COLOSTOMY;  Surgeon: [name], MD;  Location: [place];  Service: General;  Laterality: N/A;  . MELANOMA EXCISION  2001  . 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 REPAIR OF PERFORATED ULCER N/A 10/30/2015   Procedure: REPAIR OF BLEEDING  ULCER;  Surgeon: Judeth Horn, MD;  Location: [place];  Service: General;  Laterality: N/A;  . TUMOR EXCISION  2009   "back; fatty tumor"    There were no vitals filed for this visit.   Subjective Assessment - 05/21/20 1048    Subjective Patient reports his back is getting better. He does have 2 spots on the left side of his lower back that are a little sore.    Pertinent History multiple hernias, Somach feeding tube, anemia, anxiety    How long can you sit comfortably? No pain    How long can you stand comfortably? pain when he stands for too long    How long can you walk comfortably? pain while walking longer  distances    Diagnostic tests x-ray: mild degenerative changes    Currently in Pain? Yes    Pain Score 3     Pain Location Back    Pain Orientation Left    Pain Descriptors / Indicators Aching    Pain Type Chronic pain    Pain Onset More than a month ago    Pain Frequency Constant    Aggravating Factors  just comes on at times    Pain Relieving Factors voltarin and stretching    Multiple Pain Sites No                             [facility] Adult PT Treatment/Exercise - 05/21/20 0001      Lumbar Exercises: Stretches   Lower Trunk Rotation Limitations x20  Lumbar Exercises: Seated   Other Seated Lumbar Exercises bilateral ER red 2x10; Horizontal abduction redx20; wand flexion x20 improved pain today      Lumbar Exercises: Supine   Clam Limitations 2x10 red  with abdominal breathing    Bent Knee Raise Limitations supine march 2x10      Manual Therapy   Manual Therapy Joint mobilization;Manual Traction    Joint Mobilization PA Mobilizations from t-3 to T-12    Soft tissue mobilization triggerpoint release to para-spinals    Manual Traction gentle LAD bilateral with grade II and II LAD            Trigger Point Dry Needling - 05/21/20 0001    Consent Given? Yes    Education Handout Provided Yes    Other Dry Needling thoiracic upper paraspinal using a 30/30 needle 2 spots int he left quadratus    Quadratus Lumborum Response Twitch response elicited;Palpable increased muscle length    Thoracic multifidi response Twitch response elicited;Palpable increased muscle length                PT Education - 05/21/20 1049    Education Details reviewed supine exercises and technique    Person(s) Educated Patient    Methods Explanation;Demonstration;Tactile cues;Verbal cues    Comprehension Verbalized understanding;Returned demonstration;Verbal cues required;Tactile cues required            PT Short Term Goals - 05/21/20 2127      PT SHORT TERM GOAL #1    Title Pt will be indepenent in HEP as it has been established     Time 3    Period Weeks    Status On-going      PT SHORT TERM GOAL #2   Title Patient will increase lumbar flexion by 20 degrees    Baseline is being more aware of his posture     Time 3    Period Weeks    Status On-going    Target Date 04/24/20      PT SHORT TERM GOAL #3   Title Patient will increase billateral LE strength to 5/5    Baseline Is having very little pain sitting     Time 3    Period Weeks    Status On-going    Target Date 04/24/20             PT Long Term Goals - 04/03/20 1617      PT LONG TERM GOAL #1   Title Patient will perform ADL's and IADL's without pain in his back    Time 6    Period Weeks    Status New    Target Date 05/15/20      PT LONG TERM GOAL #2   Title Patient will ambualte 3000' without increased pain    Time 6    Period Weeks    Status New    Target Date 05/15/20                 Plan - 05/21/20 2119    Clinical Impression Statement Patient tolerated treatment well. Therapy needled his QL on the left side and the mid thoracic right paraspinals. He tolerated supine exercises well. His standing posture is improving although he continures to have significant thoracic kyposis. He continues to have leaking out of his old feeding tube. He will see the MD next week regaurding his feeding tube.    Personal Factors and Comorbidities Comorbidity 1;Comorbidity 2;Comorbidity 3+    Comorbidities anxiety, extensive histroy  of stomach pain, feeding tube    Examination-Activity Limitations Carry;Lift;Squat;Stand    Stability/Clinical Decision Making Evolving/Moderate complexity    Clinical Decision Making High    Rehab Potential Good    PT Frequency 2x / week    PT Duration 6 weeks    PT Treatment/Interventions ADLs/Self Care Home Management;Cryotherapy;Electrical Stimulation;Iontophoresis 4mg /ml Dexamethasone;Moist Heat;Ultrasound;Functional mobility training;Therapeutic  activities;Therapeutic exercise;Balance training;Neuromuscular re-education;Patient/family education;Manual techniques;Passive range of motion;Dry needling;Taping    PT Next Visit Plan patient had positive repsose to TPDN last episode; manual therapy to trigger points, review rotational stretching movements; Consider thoracic PA's; begin light core strengthening. Keep in mind he has had extensive stomach surgery.    PT Home Exercise Plan LTR, open book, glute stretch; tennis ball trigger poitn release    Consulted and Agree with Plan of Care Patient           Patient will benefit from skilled therapeutic intervention in order to improve the following deficits and impairments:  Increased fascial restricitons,Decreased range of motion,Increased muscle spasms,Pain,Decreased endurance,Decreased strength,Decreased mobility,Postural dysfunction  Visit Diagnosis: Chronic bilateral low back pain without sciatica  Pain in thoracic spine  Cramp and spasm  Abnormal posture     Problem List Patient Active Problem List   Diagnosis Date Noted  . Bilateral inguinal hernia 09/28/2018  . Incisional hernia 03/17/2018  . Trigger thumb, left thumb 01/19/2018  . Trigger finger, left index finger 07/18/2017  . Trigger finger, right ring finger 01/31/2017  . Trigger finger of left thumb 01/03/2017  . Trigger finger of right thumb 01/03/2017  . Trigger index finger of left hand 01/03/2017  . Trigger index finger of right hand 01/03/2017  . Malnutrition of moderate degree 07/19/2016  . Intra-abdominal abscess ([place]) 07/16/2016  . Cellulitis 07/15/2016  . S/P colostomy takedown 06/28/2016  . Pain in thoracic spine 06/09/2016  . Mid back pain 06/09/2016  . Postoperative fever 11/19/2015  . 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 Severe protein-calorie malnutrition ([place]) 11/17/2015  . Sepsis ([place]) 11/14/2015  . Hiccups 11/14/2015  . AKI (acute kidney injury) ([place]) 11/14/2015  . Fever   . Leg  swelling   . Left shoulder pain   . Muscle spasm of left shoulder   . GI bleed 10/27/2015  . Acute blood loss anemia 10/27/2015  . Syncope 10/27/2015  . Hyperglycemia 10/27/2015  . Hypotension 10/27/2015  . Neck pain 10/27/2015  . Hematemesis 10/27/2015  . Hematochezia 10/27/2015  . Diverticulitis of colon with perforation 07/05/2015    Carney Living PT DPT  05/21/2020, 9:32 PM  [facility] [address] [place], [name], [address] Phone: [phone]   Fax:  [phone]  Name: Miguel [name] MRN: [id number] Date of Birth: 1950/11/13

## 2020-05-27 ENCOUNTER — Other Ambulatory Visit: Payer: Self-pay

## 2020-05-27 ENCOUNTER — Encounter: Payer: Self-pay | Admitting: Physical Therapy

## 2020-05-27 ENCOUNTER — Ambulatory Visit: Payer: Medicare Other | Attending: Orthopaedic Surgery | Admitting: Physical Therapy

## 2020-05-27 DIAGNOSIS — M545 Low back pain, unspecified: Secondary | ICD-10-CM | POA: Insufficient documentation

## 2020-05-27 DIAGNOSIS — G8929 Other chronic pain: Secondary | ICD-10-CM | POA: Insufficient documentation

## 2020-05-27 DIAGNOSIS — M546 Pain in thoracic spine: Secondary | ICD-10-CM | POA: Diagnosis not present

## 2020-05-27 DIAGNOSIS — R293 Abnormal posture: Secondary | ICD-10-CM | POA: Insufficient documentation

## 2020-05-27 DIAGNOSIS — R252 Cramp and spasm: Secondary | ICD-10-CM | POA: Diagnosis not present

## 2020-05-28 NOTE — Therapy (Signed)
[place], [name], [address] Phone: [phone]   Fax:  [phone]  Physical Therapy Treatment  Patient Details  Name: Miguel [name] MRN: [id number] Date of Birth: [birth year] Referring Provider (PT): Dr Jean Rosenthal   Encounter Date: 05/27/2020   PT End of Session - 05/28/20 0916    Visit Number 8    Number of Visits 13    Date for PT Re-Evaluation 07/02/20    Authorization Type Medicare progress note done on vivit 7. Next to be done on 17    PT Start Time 1015    PT Stop Time 1057    PT Time Calculation (min) 42 min    Activity Tolerance Patient tolerated treatment well    Behavior During Therapy WFL for tasks assessed/performed           Past Medical History:  Diagnosis Date  . Anemia   . Anxiety   . BPH (benign prostatic hyperplasia)   . Gastritis   . GERD (gastroesophageal reflux disease)    "seldom" (07/16/2016)  . GI bleed due to NSAIDs 10/27/2015  . History of blood transfusion 06/2016   post OR/notes 07/15/2016  . History of hiatal hernia   . History of kidney stones   . Hyperlipidemia   . Hypertension    no meds   . Melanoma of back ([place])    "mid back"  . Sigmoid diverticulitis    with perforation    Past Surgical History:  Procedure Laterality Date  . COLON SURGERY     sigmoid  . COLOSTOMY TAKEDOWN N/A 06/28/2016   Procedure: COLOSTOMY TAKEDOWN;  Surgeon: [name], MD;  Location: [place];  Service: General;  Laterality: N/A;  . CYSTOSCOPY WITH RETROGRADE PYELOGRAM, URETEROSCOPY AND STENT PLACEMENT Bilateral 02/12/2020   Procedure: CYSTOSCOPY WITH BILATERAL RETROGRADE PYELOGRAM,  [place];  Surgeon: Remi Haggard, MD;  [facility];  Service: Urology;  Laterality: Bilateral;  1 HR  . ESOPHAGOGASTRODUODENOSCOPY N/A 10/27/2015   Procedure: ESOPHAGOGASTRODUODENOSCOPY (EGD);  Surgeon: Clarene Essex, MD;  Location: [facility] ENDOSCOPY;  Service: Endoscopy;   Laterality: N/A;  . ESOPHAGOGASTRODUODENOSCOPY N/A 10/29/2015   Procedure: ESOPHAGOGASTRODUODENOSCOPY (EGD);  Surgeon: Ronald Lobo, MD;  Location: [facility] ENDOSCOPY;  Service: Endoscopy;  Laterality: N/A;  . ESOPHAGOGASTRODUODENOSCOPY N/A 10/30/2015   Procedure: ESOPHAGOGASTRODUODENOSCOPY (EGD);  Surgeon: Clarene Essex, MD;  Location: [facility]COPY;  Service: Endoscopy;  Laterality: N/A;  . GASTROSTOMY TUBE PLACEMENT  11/21/2015   REDUCTION OF HIATAL HERNIA , REPAIR HIATAL HERNIA, RESECTION SMALL BOWEL WITH ANASTOMOSIS, PLACEMENT GASTROSTOMY TUBE, PLACEMENT DUODENOSTOMY TUBE (N/A)  . HEMORRHOID BANDING  X 2  . HERNIA REPAIR    . HIATAL HERNIA REPAIR N/A 11/21/2015   Procedure: REDUCTION OF HIATAL HERNIA , REPAIR HIATAL HERNIA, RESECTION SMALL BOWEL WITH ANASTOMOSIS, PLACEMENT GASTROSTOMY TUBE, PLACEMENT DUODENOSTOMY TUBE;  Surgeon: Mickeal Skinner, MD;  Location: [place];  Service: General;  Laterality: N/A;  . HOLMIUM LASER APPLICATION Right 89/38/1017   Procedure: HOLMIUM LASER APPLICATION;  Surgeon: Remi Haggard, MD;  [facility];  Service: Urology;  Laterality: Right;  . INCISIONAL HERNIA REPAIR  06/28/2016   open/notes 07/15/2016  . INCISIONAL HERNIA REPAIR  03/17/2018   WITH MESH  . INCISIONAL HERNIA REPAIR N/A 03/17/2018   Procedure: INCISIONAL HERNIA REPAIR WITH MESH;  Surgeon: [name], MD;  Location: [place];  Service: General;  Laterality: N/A;  . INGUINAL HERNIA REPAIR Bilateral 09/28/2018  . INGUINAL HERNIA REPAIR Bilateral 09/28/2018   Procedure:  BILATERAL OPEN INGUINAL HERNIA REPAIR WITH MESH;  Surgeon: [name], MD;  Location: [place];  Service: General;  Laterality: Bilateral;  GENERAL AND TAP BLOCK  . INSERTION OF MESH N/A 03/17/2018   Procedure: INSERTION OF MESH;  Surgeon: [name], MD;  Location: [place];  Service: General;  Laterality: N/A;  . INSERTION OF MESH Bilateral 09/28/2018   Procedure: Insertion Of Mesh;  Surgeon: [name], MD;   Location: [place];  Service: General;  Laterality: Bilateral;  . IR CM INJ ANY COLONIC TUBE W/FLUORO  02/04/2017  . IR GUIDED DRAIN W CATHETER PLACEMENT  07/06/2016   /NOTES 07/15/2016  . IR PATIENT EVAL TECH 0-60 MINS  06/28/2019  . IR RADIOLOGIST EVAL & MGMT  07/27/2016  . IR RADIOLOGIST EVAL & MGMT  08/17/2016  . IR RADIOLOGIST EVAL & MGMT  08/26/2016  . IR REPLACE G-TUBE SIMPLE WO FLUORO  07/28/2017  . IR REPLACE G-TUBE SIMPLE WO FLUORO  01/31/2018  . IR REPLACE G-TUBE SIMPLE WO FLUORO  10/16/2018  . IR REPLACE G-TUBE SIMPLE WO FLUORO  03/05/2019  . IR REPLACE G-TUBE SIMPLE WO FLUORO  08/22/2019  . IR REPLACE G-TUBE SIMPLE WO FLUORO  01/08/2020  . IR [place] TUBE PERCUT W/FLUORO  08/04/2016  . IR [place] TUBE PERCUT W/FLUORO  01/14/2017  . IR US GUIDE BX ASP/DRAIN  08/04/2016  . KNEE CARTILAGE SURGERY Right 1971   "opened me up"  . LAPAROTOMY N/A 07/05/2015   Procedure: PARTIAL SIGMOID COLECTOMY AND COLOSTOMY;  Surgeon: [name], MD;  Location: [place];  Service: General;  Laterality: N/A;  . MELANOMA EXCISION  2001  . 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 REPAIR OF PERFORATED ULCER N/A 10/30/2015   Procedure: REPAIR OF BLEEDING  ULCER;  Surgeon: Judeth Horn, MD;  Location: [place];  Service: General;  Laterality: N/A;  . TUMOR EXCISION  2009   "back; fatty tumor"    There were no vitals filed for this visit.   Subjective Assessment - 05/27/20 1025    Subjective Patient reports he has been doing well. He is having minor pain.    Pertinent History multiple hernias, Somach feeding tube, anemia, anxiety    How long can you sit comfortably? No pain    How long can you stand comfortably? pain when he stands for too long    How long can you walk comfortably? pain while walking longer distances    Diagnostic tests x-ray: mild degenerative  changes    Patient Stated Goals to be able to be active    Currently in Pain? Yes    Pain Score 1     Pain Location Back    Pain Orientation Left    Pain Descriptors / Indicators Aching    Pain Type Chronic pain    Pain Onset More than a month ago    Pain Frequency Constant    Aggravating Factors  activity    Pain Relieving Factors voltarin and stretching    Effect of Pain on Daily Activities pain with activity    Multiple Pain Sites No                             [facility] Adult PT Treatment/Exercise - 05/28/20 0001      Lumbar Exercises: Stretches  Lower Trunk Rotation Limitations x20    Piriformis Stretch Limitations glute stretch 3x20 sec hold      Lumbar Exercises: Standing   Other Standing Lumbar Exercises scap retraction 2x10 red; shoulder extnesion red 2x10      Lumbar Exercises: Supine   Clam Limitations 2x10 red  with abdominal breathing    Bent Knee Raise Limitations supine march 2x10      Manual Therapy   Manual Therapy Joint mobilization;Manual Traction    Joint Mobilization PA Mobilizations from t-3 to T-12    Soft tissue mobilization triggerpoint release to para-spinals    Manual Traction gentle LAD bilateral with grade II and II LAD                  PT Education - 05/27/20 1623    Education Details reviewed postural ther-ex    Person(s) Educated Patient    Methods Explanation;Demonstration;Tactile cues;Verbal cues    Comprehension Verbalized understanding;Returned demonstration;Verbal cues required;Tactile cues required            PT Short Term Goals - 05/21/20 2127      PT SHORT TERM GOAL #1   Title Pt will be indepenent in HEP as it has been established     Time 3    Period Weeks    Status On-going      PT SHORT TERM GOAL #2   Title Patient will increase lumbar flexion by 20 degrees    Baseline is being more aware of his posture     Time 3    Period Weeks    Status On-going    Target Date 04/24/20      PT SHORT  TERM GOAL #3   Title Patient will increase billateral LE strength to 5/5    Baseline Is having very little pain sitting     Time 3    Period Weeks    Status On-going    Target Date 04/24/20             PT Long Term Goals - 04/03/20 1617      PT LONG TERM GOAL #1   Title Patient will perform ADL's and IADL's without pain in his back    Time 6    Period Weeks    Status New    Target Date 05/15/20      PT LONG TERM GOAL #2   Title Patient will ambualte 3000' without increased pain    Time 6    Period Weeks    Status New    Target Date 05/15/20                 Plan - 05/28/20 9629    Clinical Impression Statement Patient continues to make progres.s he is standing straighter and ytolerating more ther-ex. He had minor pain today inthe left mid throacic paraspinal. H ehad no paoin after needling and manual therapy. We will continue to advance ther-ex as tolerated.    Personal Factors and Comorbidities Comorbidity 1;Comorbidity 2;Comorbidity 3+    Comorbidities anxiety, extensive histroy of stomach pain, feeding tube    Examination-Activity Limitations Carry;Lift;Squat;Stand    Stability/Clinical Decision Making Evolving/Moderate complexity    Clinical Decision Making High    Rehab Potential Good    PT Frequency 2x / week    PT Duration 6 weeks    PT Treatment/Interventions ADLs/Self Care Home Management;Cryotherapy;Electrical Stimulation;Iontophoresis 4mg /ml Dexamethasone;Moist Heat;Ultrasound;Functional mobility training;Therapeutic activities;Therapeutic exercise;Balance training;Neuromuscular re-education;Patient/family education;Manual techniques;Passive range of motion;Dry needling;Taping    PT  Next Visit Plan patient had positive repsose to TPDN last episode; manual therapy to trigger points, review rotational stretching movements; Consider thoracic PA's; begin light core strengthening. Keep in mind he has had extensive stomach surgery.    PT Home Exercise Plan LTR,  open book, glute stretch; tennis ball trigger poitn release    Consulted and Agree with Plan of Care Patient           Patient will benefit from skilled therapeutic intervention in order to improve the following deficits and impairments:  Increased fascial restricitons,Decreased range of motion,Increased muscle spasms,Pain,Decreased endurance,Decreased strength,Decreased mobility,Postural dysfunction  Visit Diagnosis: Chronic bilateral low back pain without sciatica  Pain in thoracic spine  Cramp and spasm  Abnormal posture     Problem List Patient Active Problem List   Diagnosis Date Noted  . Bilateral inguinal hernia 09/28/2018  . Incisional hernia 03/17/2018  . Trigger thumb, left thumb 01/19/2018  . Trigger finger, left index finger 07/18/2017  . Trigger finger, right ring finger 01/31/2017  . Trigger finger of left thumb 01/03/2017  . Trigger finger of right thumb 01/03/2017  . Trigger index finger of left hand 01/03/2017  . Trigger index finger of right hand 01/03/2017  . Malnutrition of moderate degree 07/19/2016  . Intra-abdominal abscess ([place]) 07/16/2016  . Cellulitis 07/15/2016  . S/P colostomy takedown 06/28/2016  . Pain in thoracic spine 06/09/2016  . Mid back pain 06/09/2016  . Postoperative fever 11/19/2015  . 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 Severe protein-calorie malnutrition ([place]) 11/17/2015  . Sepsis ([place]) 11/14/2015  . Hiccups 11/14/2015  . AKI (acute kidney injury) ([place]) 11/14/2015  . Fever   . Leg swelling   . Left shoulder pain   . Muscle spasm of left shoulder   . GI bleed 10/27/2015  . Acute blood loss anemia 10/27/2015  . Syncope 10/27/2015  . Hyperglycemia 10/27/2015  . Hypotension 10/27/2015  . Neck pain 10/27/2015  . Hematemesis 10/27/2015  . Hematochezia 10/27/2015  . Diverticulitis of colon with perforation 07/05/2015    Carney Living PT DPT  05/28/2020, 9:21 AM  [place], [name], [address] Phone: [phone]   Fax:  [phone]  Name: Miguel [name] MRN: [id number] Date of Birth: [birth year]

## 2020-06-12 ENCOUNTER — Encounter: Payer: Self-pay | Admitting: Physical Therapy

## 2020-06-12 ENCOUNTER — Other Ambulatory Visit: Payer: Self-pay

## 2020-06-12 ENCOUNTER — Ambulatory Visit: Payer: Medicare Other | Admitting: Physical Therapy

## 2020-06-12 DIAGNOSIS — M546 Pain in thoracic spine: Secondary | ICD-10-CM

## 2020-06-12 DIAGNOSIS — M545 Low back pain, unspecified: Secondary | ICD-10-CM

## 2020-06-12 DIAGNOSIS — R252 Cramp and spasm: Secondary | ICD-10-CM | POA: Diagnosis not present

## 2020-06-12 DIAGNOSIS — R293 Abnormal posture: Secondary | ICD-10-CM

## 2020-06-12 DIAGNOSIS — G8929 Other chronic pain: Secondary | ICD-10-CM

## 2020-06-12 NOTE — Therapy (Signed)
[facility] [place], [name], [address] Phone: [phone]   Fax:  [phone]  Physical Therapy Treatment  Patient Details  Name: Miguel [name] MRN: [id number] Date of Birth: 22-May-1950 Referring Provider (PT): Dr Jean Rosenthal   Encounter Date: 06/12/2020   PT End of Session - 06/12/20 1512    Visit Number 9    Number of Visits 13    Date for PT Re-Evaluation 07/02/20    Authorization Type Medicare progress note done on vivit 7. Next to be done on 17    PT Start Time 1100    PT Stop Time 1143    PT Time Calculation (min) 43 min    Activity Tolerance Patient tolerated treatment well    Behavior During Therapy WFL for tasks assessed/performed           Past Medical History:  Diagnosis Date  . Anemia   . Anxiety   . BPH (benign prostatic hyperplasia)   . Gastritis   . GERD (gastroesophageal reflux disease)    "seldom" (07/16/2016)  . GI bleed due to NSAIDs 10/27/2015  . History of blood transfusion 06/2016   post OR/notes 07/15/2016  . History of hiatal hernia   . History of kidney stones   . Hyperlipidemia   . Hypertension    no meds   . Melanoma of back ([place])    "mid back"  . Sigmoid diverticulitis    with perforation    Past Surgical History:  Procedure Laterality Date  . COLON SURGERY     sigmoid  . COLOSTOMY TAKEDOWN N/A 06/28/2016   Procedure: COLOSTOMY TAKEDOWN;  Surgeon: [name], MD;  Location: [facility];  Service: General;  Laterality: N/A;  . CYSTOSCOPY WITH RETROGRADE PYELOGRAM, URETEROSCOPY AND STENT PLACEMENT Bilateral 02/12/2020   Procedure: CYSTOSCOPY WITH BILATERAL RETROGRADE PYELOGRAM,  [place];  Surgeon: Remi Haggard, MD;  Location: [facility];  Service: Urology;  Laterality: Bilateral;  1 HR  . ESOPHAGOGASTRODUODENOSCOPY N/A 10/27/2015   Procedure: ESOPHAGOGASTRODUODENOSCOPY (EGD);  Surgeon: Clarene Essex, MD;  Location: [facility] ENDOSCOPY;  Service: Endoscopy;   Laterality: N/A;  . ESOPHAGOGASTRODUODENOSCOPY N/A 10/29/2015   Procedure: ESOPHAGOGASTRODUODENOSCOPY (EGD);  Surgeon: Ronald Lobo, MD;  Location: [facility] ENDOSCOPY;  Service: Endoscopy;  Laterality: N/A;  . ESOPHAGOGASTRODUODENOSCOPY N/A 10/30/2015   Procedure: ESOPHAGOGASTRODUODENOSCOPY (EGD);  Surgeon: Clarene Essex, MD;  Location[facility] ENDOSCOPY;  Service: Endoscopy;  Laterality: N/A;  . GASTROSTOMY TUBE PLACEMENT  11/21/2015   REDUCTION OF HIATAL HERNIA , REPAIR HIATAL HERNIA, RESECTION SMALL BOWEL WITH ANASTOMOSIS, PLACEMENT GASTROSTOMY TUBE, PLACEMENT DUODENOSTOMY TUBE (N/A)  . HEMORRHOID BANDING  X 2  . HERNIA REPAIR    . HIATAL HERNIA REPAIR N/A 11/21/2015   Procedure: REDUCTION OF HIATAL HERNIA , REPAIR HIATAL HERNIA, RESECTION SMALL BOWEL WITH ANASTOMOSIS, PLACEMENT GASTROSTOMY TUBE, PLACEMENT DUODENOSTOMY TUBE;  Surgeon: Mickeal Skinner, MD;  Location: [place];  Service: General;  Laterality: N/A;  . HOLMIUM LASER APPLICATION Right 82/99/3716   Procedure: HOLMIUM LASER APPLICATION;  Surgeon: Remi Haggard, MD;  Location: [facility];  Service: Urology;  Laterality: Right;  . INCISIONAL HERNIA REPAIR  06/28/2016   open/notes 07/15/2016  . INCISIONAL HERNIA REPAIR  03/17/2018   WITH MESH  . INCISIONAL HERNIA REPAIR N/A 03/17/2018   Procedure: INCISIONAL HERNIA REPAIR WITH MESH;  Surgeon: [name], MD;  Location: [place];  Service: General;  Laterality: N/A;  . INGUINAL HERNIA REPAIR Bilateral 09/28/2018  . INGUINAL HERNIA REPAIR Bilateral 09/28/2018   Procedure:  BILATERAL OPEN INGUINAL HERNIA REPAIR WITH MESH;  Surgeon: [name], MD;  Location: [place];  Service: General;  Laterality: Bilateral;  GENERAL AND TAP BLOCK  . INSERTION OF MESH N/A 03/17/2018   Procedure: INSERTION OF MESH;  Surgeon: [name], MD;  Location: [place];  Service: General;  Laterality: N/A;  . INSERTION OF MESH Bilateral 09/28/2018   Procedure: Insertion Of Mesh;  Surgeon: [name], MD;   Location: [place];  Service: General;  Laterality: Bilateral;  . IR CM INJ ANY COLONIC TUBE W/FLUORO  02/04/2017  . IR GUIDED DRAIN W CATHETER PLACEMENT  07/06/2016   /NOTES 07/15/2016  . IR PATIENT EVAL TECH 0-60 MINS  06/28/2019  . IR RADIOLOGIST EVAL & MGMT  07/27/2016  . IR RADIOLOGIST EVAL & MGMT  08/17/2016  . IR RADIOLOGIST EVAL & MGMT  08/26/2016  . IR REPLACE G-TUBE SIMPLE WO FLUORO  07/28/2017  . IR REPLACE G-TUBE SIMPLE WO FLUORO  01/31/2018  . IR REPLACE G-TUBE SIMPLE WO FLUORO  10/16/2018  . IR REPLACE G-TUBE SIMPLE WO FLUORO  03/05/2019  . IR REPLACE G-TUBE SIMPLE WO FLUORO  08/22/2019  . IR REPLACE G-TUBE SIMPLE WO FLUORO  01/08/2020  . IR [place] TUBE PERCUT W/FLUORO  08/04/2016  . IR [place] TUBE PERCUT W/FLUORO  01/14/2017  . IR US GUIDE BX ASP/DRAIN  08/04/2016  . KNEE CARTILAGE SURGERY Right 1971   "opened me up"  . LAPAROTOMY N/A 07/05/2015   Procedure: PARTIAL SIGMOID COLECTOMY AND COLOSTOMY;  Surgeon: [name], MD;  Location: [place];  Service: General;  Laterality: N/A;  . MELANOMA EXCISION  2001  . 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 REPAIR OF PERFORATED ULCER N/A 10/30/2015   Procedure: REPAIR OF BLEEDING  ULCER;  Surgeon: Judeth Horn, MD;  Location: [place];  Service: General;  Laterality: N/A;  . TUMOR EXCISION  2009   "back; fatty tumor"    There were no vitals filed for this visit.   Subjective Assessment - 06/12/20 1150    Subjective Patient reports his back is feeling pretty good but he is still having pain in the front. he feels like he is being pulled forward.    Pertinent History multiple hernias, [place] feeding tube, anemia, anxiety    How long can you sit comfortably? No pain    How long can you stand comfortably? pain when he stands for too long    How long can you walk comfortably? pain while walking  longer distances    Diagnostic tests x-ray: mild degenerative changes    Patient Stated Goals to be able to be active    Currently in Pain? Yes   just a little pain in the back   Pain Score 2     Pain Location Back    Pain Orientation Left    Pain Descriptors / Indicators Aching    Pain Type Chronic pain    Pain Onset More than a month ago    Pain Frequency Constant    Aggravating Factors  activity    Pain Relieving Factors voltarin and stretching    Effect of Pain on Daily Activities pain with activity    Multiple Pain Sites No  [place] Adult PT Treatment/Exercise - 06/12/20 0001      Lumbar Exercises: Stretches   Lower Trunk Rotation Limitations x20    Piriformis Stretch Limitations glute stretch 3x20 sec hold      Lumbar Exercises: Standing   Other Standing Lumbar Exercises scap retraction 2x10 red; shoulder extnesion red 2x10      Lumbar Exercises: Seated   Other Seated Lumbar Exercises bilateral ER red 2x10; Horizontal abduction redx20; wand flexion x20 improved pain today      Lumbar Exercises: Supine   Clam Limitations 2x10 red  with abdominal breathing    Bent Knee Raise Limitations supine march 2x10      Manual Therapy   Manual Therapy Joint mobilization;Manual Traction    Joint Mobilization PA Mobilizations from t-3 to T-12    Soft tissue mobilization triggerpoint release to para-spinals    Manual Traction gentle LAD bilateral with grade II and II LAD            Trigger Point Dry Needling - 06/12/20 0001    Consent Given? Yes    Education Handout Provided Yes    Other Dry Needling lef t and right mid throacic perispinals 2 spots each; Lefft QLx2 bilateral    Quadratus Lumborum Response Twitch response elicited;Palpable increased muscle length    Thoracic multifidi response Twitch response elicited;Palpable increased muscle length                PT Education - 06/12/20 1509    Education Details reviewed  exercise technique for HEP    Person(s) Educated Patient    Methods Explanation;Demonstration;Tactile cues;Verbal cues    Comprehension Verbalized understanding;Returned demonstration;Verbal cues required;Tactile cues required            PT Short Term Goals - 05/21/20 2127      PT SHORT TERM GOAL #1   Title Pt will be indepenent in HEP as it has been established     Time 3    Period Weeks    Status On-going      PT SHORT TERM GOAL #2   Title Patient will increase lumbar flexion by 20 degrees    Baseline is being more aware of his posture     Time 3    Period Weeks    Status On-going    Target Date 04/24/20      PT SHORT TERM GOAL #3   Title Patient will increase billateral LE strength to 5/5    Baseline Is having very little pain sitting     Time 3    Period Weeks    Status On-going    Target Date 04/24/20             PT Long Term Goals - 04/03/20 1617      PT LONG TERM GOAL #1   Title Patient will perform ADL's and IADL's without pain in his back    Time 6    Period Weeks    Status New    Target Date 05/15/20      PT LONG TERM GOAL #2   Title Patient will ambualte 3000' without increased pain    Time 6    Period Weeks    Status New    Target Date 05/15/20                 Plan - 06/12/20 1516    Clinical Impression Statement Patient continues to make good progress. he had no increase in pain with exercises. We continue  to work on needling and manual therapy t o improve posture. He will see the MD tomorrow about his ostomy that is still leaking whenever he drinks.    Personal Factors and Comorbidities Comorbidity 1;Comorbidity 2;Comorbidity 3+    Comorbidities anxiety, extensive histroy of stomach pain, feeding tube    Examination-Activity Limitations Carry;Lift;Squat;Stand    Stability/Clinical Decision Making Evolving/Moderate complexity    Clinical Decision Making High    Rehab Potential Good    PT Frequency 2x / week    PT Duration 6 weeks     PT Treatment/Interventions ADLs/Self Care Home Management;Cryotherapy;Electrical Stimulation;Iontophoresis 4mg /ml Dexamethasone;Moist Heat;Ultrasound;Functional mobility training;Therapeutic activities;Therapeutic exercise;Balance training;Neuromuscular re-education;Patient/family education;Manual techniques;Passive range of motion;Dry needling;Taping    PT Next Visit Plan patient had positive repsose to TPDN last episode; manual therapy to trigger points, review rotational stretching movements; Consider thoracic PA's; begin light core strengthening. Keep in mind he has had extensive stomach surgery.    PT Home Exercise Plan LTR, open book, glute stretch; tennis ball trigger poitn release    Consulted and Agree with Plan of Care Patient           Patient will benefit from skilled therapeutic intervention in order to improve the following deficits and impairments:  Increased fascial restricitons,Decreased range of motion,Increased muscle spasms,Pain,Decreased endurance,Decreased strength,Decreased mobility,Postural dysfunction  Visit Diagnosis: Chronic bilateral low back pain without sciatica  Pain in thoracic spine  Cramp and spasm  Abnormal posture     Problem List Patient Active Problem List   Diagnosis Date Noted  . Bilateral inguinal hernia 09/28/2018  . Incisional hernia 03/17/2018  . Trigger thumb, left thumb 01/19/2018  . Trigger finger, left index finger 07/18/2017  . Trigger finger, right ring finger 01/31/2017  . Trigger finger of left thumb 01/03/2017  . Trigger finger of right thumb 01/03/2017  . Trigger index finger of left hand 01/03/2017  . Trigger index finger of right hand 01/03/2017  . Malnutrition of moderate degree 07/19/2016  . Intra-abdominal abscess ([place]) 07/16/2016  . Cellulitis 07/15/2016  . S/P colostomy takedown 06/28/2016  . Pain in thoracic spine 06/09/2016  . Mid back pain 06/09/2016  . Postoperative fever 11/19/2015  . S/P partial  gastrectomy 11/19/2015  . Severe protein-calorie malnutrition ([place]) 11/17/2015  . Sepsis ([place]) 11/14/2015  . Hiccups 11/14/2015  . AKI (acute kidney injury) ([place]) 11/14/2015  . Fever   . Leg swelling   . Left shoulder pain   . Muscle spasm of left shoulder   . GI bleed 10/27/2015  . Acute blood loss anemia 10/27/2015  . Syncope 10/27/2015  . Hyperglycemia 10/27/2015  . Hypotension 10/27/2015  . Neck pain 10/27/2015  . Hematemesis 10/27/2015  . Hematochezia 10/27/2015  . Diverticulitis of colon with perforation 07/05/2015    Miguel [name] PT DPT  06/12/2020, 3:24 PM  [facility] [address] [place], [name], [address] Phone: [phone]   Fax:  [phone]  Name: Miguel [name] MRN: [id number] Date of Birth: 11/29/1950

## 2020-06-13 ENCOUNTER — Other Ambulatory Visit: Payer: Self-pay | Admitting: Surgery

## 2020-06-13 DIAGNOSIS — K316 Fistula of stomach and duodenum: Secondary | ICD-10-CM | POA: Diagnosis not present

## 2020-06-17 ENCOUNTER — Ambulatory Visit: Payer: Medicare Other | Admitting: Physical Therapy

## 2020-06-17 ENCOUNTER — Encounter: Payer: Self-pay | Admitting: Physical Therapy

## 2020-06-17 ENCOUNTER — Other Ambulatory Visit: Payer: Self-pay

## 2020-06-17 DIAGNOSIS — M546 Pain in thoracic spine: Secondary | ICD-10-CM | POA: Diagnosis not present

## 2020-06-17 DIAGNOSIS — M545 Low back pain, unspecified: Secondary | ICD-10-CM

## 2020-06-17 DIAGNOSIS — R252 Cramp and spasm: Secondary | ICD-10-CM

## 2020-06-17 DIAGNOSIS — G8929 Other chronic pain: Secondary | ICD-10-CM

## 2020-06-17 DIAGNOSIS — R293 Abnormal posture: Secondary | ICD-10-CM

## 2020-06-18 NOTE — Therapy (Signed)
[place], [name], [address] Phone: [phone]   Fax:  [phone]  Physical Therapy Treatment  Patient Details  Name: Miguel [name] MRN: [id number] Date of Birth: March 08, 1951 Referring Provider (PT): Dr Jean Rosenthal   Encounter Date: 06/17/2020   PT End of Session - 06/17/20 1343    Visit Number 10    Number of Visits 13    Date for PT Re-Evaluation 07/02/20    Authorization Type Medicare progress note done on vivit 7. Next to be done on 17    PT Start Time 1330    PT Stop Time 1412    PT Time Calculation (min) 42 min    Activity Tolerance Patient tolerated treatment well    Behavior During Therapy [facility] for tasks assessed/performed           Past Medical History:  Diagnosis Date  . Anemia   . Anxiety   . BPH (benign prostatic hyperplasia)   . Gastritis   . GERD (gastroesophageal reflux disease)    "seldom" (07/16/2016)  . GI bleed due to NSAIDs 10/27/2015  . History of blood transfusion 06/2016   post OR/notes 07/15/2016  . History of hiatal hernia   . History of kidney stones   . Hyperlipidemia   . Hypertension    no meds   . Melanoma of back ([place])    "mid back"  . Sigmoid diverticulitis    with perforation    Past Surgical History:  Procedure Laterality Date  . COLON SURGERY     sigmoid  . COLOSTOMY TAKEDOWN N/A 06/28/2016   Procedure: COLOSTOMY TAKEDOWN;  Surgeon: [name], MD;  Location: [place];  Service: General;  Laterality: N/A;  . CYSTOSCOPY WITH RETROGRADE PYELOGRAM, URETEROSCOPY AND STENT PLACEMENT Bilateral 02/12/2020   Procedure: CYSTOSCOPY WITH BILATERAL RETROGRADE PYELOGRAM,  [place];  Surgeon: Remi Haggard, MD;  [facility];  Service: Urology;  Laterality: Bilateral;  1 HR  . ESOPHAGOGASTRODUODENOSCOPY N/A 10/27/2015   Procedure: ESOPHAGOGASTRODUODENOSCOPY (EGD);  Surgeon: Clarene Essex, MD;  Location: [facility] ENDOSCOPY;  Service: Endoscopy;   Laterality: N/A;  . ESOPHAGOGASTRODUODENOSCOPY N/A 10/29/2015   Procedure: ESOPHAGOGASTRODUODENOSCOPY (EGD);  Surgeon: Ronald Lobo, MD;  Location[facility]COPY;  Service: Endoscopy;  Laterality: N/A;  . ESOPHAGOGASTRODUODENOSCOPY N/A 10/30/2015   Procedure: ESOPHAGOGASTRODUODENOSCOPY (EGD);  Surgeon: Clarene Essex, MD;  Location: [facility] ENDOSCOPY;  Service: Endoscopy;  Laterality: N/A;  . GASTROSTOMY TUBE PLACEMENT  11/21/2015   REDUCTION OF HIATAL HERNIA , REPAIR HIATAL HERNIA, RESECTION SMALL BOWEL WITH ANASTOMOSIS, PLACEMENT GASTROSTOMY TUBE, PLACEMENT DUODENOSTOMY TUBE (N/A)  . HEMORRHOID BANDING  X 2  . HERNIA REPAIR    . HIATAL HERNIA REPAIR N/A 11/21/2015   Procedure: REDUCTION OF HIATAL HERNIA , REPAIR HIATAL HERNIA, RESECTION SMALL BOWEL WITH ANASTOMOSIS, PLACEMENT GASTROSTOMY TUBE, PLACEMENT DUODENOSTOMY TUBE;  Surgeon: Mickeal Skinner, MD;  Location: [place];  Service: General;  Laterality: N/A;  . HOLMIUM LASER APPLICATION Right 66/08/3014   Procedure: HOLMIUM LASER APPLICATION;  Surgeon: Remi Haggard, MD;  [facility];  Service: Urology;  Laterality: Right;  . INCISIONAL HERNIA REPAIR  06/28/2016   open/notes 07/15/2016  . INCISIONAL HERNIA REPAIR  03/17/2018   WITH MESH  . INCISIONAL HERNIA REPAIR N/A 03/17/2018   Procedure: INCISIONAL HERNIA REPAIR WITH MESH;  Surgeon: [name], MD;  Location: [place];  Service: General;  Laterality: N/A;  . INGUINAL HERNIA REPAIR Bilateral 09/28/2018  . INGUINAL HERNIA REPAIR Bilateral 09/28/2018   Procedure:  BILATERAL OPEN INGUINAL HERNIA REPAIR WITH MESH;  Surgeon: [name], MD;  Location: [place];  Service: General;  Laterality: Bilateral;  GENERAL AND TAP BLOCK  . INSERTION OF MESH N/A 03/17/2018   Procedure: INSERTION OF MESH;  Surgeon: [name], MD;  Location: [place];  Service: General;  Laterality: N/A;  . INSERTION OF MESH Bilateral 09/28/2018   Procedure: Insertion Of Mesh;  Surgeon: [name], MD;   Location: [place];  Service: General;  Laterality: Bilateral;  . IR CM INJ ANY COLONIC TUBE W/FLUORO  02/04/2017  . IR GUIDED DRAIN W CATHETER PLACEMENT  07/06/2016   /NOTES 07/15/2016  . IR PATIENT EVAL TECH 0-60 MINS  06/28/2019  . IR RADIOLOGIST EVAL & MGMT  07/27/2016  . IR RADIOLOGIST EVAL & MGMT  08/17/2016  . IR RADIOLOGIST EVAL & MGMT  08/26/2016  . IR REPLACE G-TUBE SIMPLE WO FLUORO  07/28/2017  . IR REPLACE G-TUBE SIMPLE WO FLUORO  01/31/2018  . IR REPLACE G-TUBE SIMPLE WO FLUORO  10/16/2018  . IR REPLACE G-TUBE SIMPLE WO FLUORO  03/05/2019  . IR REPLACE G-TUBE SIMPLE WO FLUORO  08/22/2019  . IR REPLACE G-TUBE SIMPLE WO FLUORO  01/08/2020  . IR [place] TUBE PERCUT W/FLUORO  08/04/2016  . IR [place] TUBE PERCUT W/FLUORO  01/14/2017  . IR US GUIDE BX ASP/DRAIN  08/04/2016  . KNEE CARTILAGE SURGERY Right 1971   "opened me up"  . LAPAROTOMY N/A 07/05/2015   Procedure: PARTIAL SIGMOID COLECTOMY AND COLOSTOMY;  Surgeon: [name], MD;  Location: [place];  Service: General;  Laterality: N/A;  . MELANOMA EXCISION  2001  . 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 REPAIR OF PERFORATED ULCER N/A 10/30/2015   Procedure: REPAIR OF BLEEDING  ULCER;  Surgeon: Judeth Horn, MD;  Location: [place];  Service: General;  Laterality: N/A;  . TUMOR EXCISION  2009   "back; fatty tumor"    There were no vitals filed for this visit.   Subjective Assessment - 06/17/20 1340    Subjective Patient has to have surgery on his feeding tube site. it is not closing on its own. He reports it is drawing him forward which is hurting his back some. Overall his back has been pretty good.    Pertinent History multiple hernias, Somach feeding tube, anemia, anxiety    How long can you sit comfortably? No pain    How long can you stand comfortably? pain when he stands for too  long    How long can you walk comfortably? pain while walking longer distances    Diagnostic tests x-ray: mild degenerative changes    Currently in Pain? Yes    Pain Score 2     Pain Location Back    Pain Orientation Left    Pain Descriptors / Indicators Aching    Pain Type Chronic pain    Pain Onset More than a month ago    Pain Frequency Constant    Aggravating Factors  activty    Pain Relieving Factors voltarin and stretching    Effect of Pain on Daily Activities pain with activity    Multiple Pain Sites No                             [facility] Adult PT Treatment/Exercise -  06/18/20 0001      Lumbar Exercises: Stretches   Lower Trunk Rotation Limitations x20    Piriformis Stretch Limitations glute stretch 3x20 sec hold      Lumbar Exercises: Standing   Other Standing Lumbar Exercises scap retraction 2x10 red; shoulder extnesion red 2x10    Other Standing Lumbar Exercises Standing march 2x10; standing hip abduction 2x10;      Lumbar Exercises: Seated   Other Seated Lumbar Exercises bilateral ER red 2x10; Horizontal abduction redx20; wand flexion x20 improved pain today      Lumbar Exercises: Supine   Clam Limitations 2x10 red  with abdominal breathing    Bent Knee Raise Limitations supine march 2x10      Manual Therapy   Manual Therapy Joint mobilization;Manual Traction    Joint Mobilization PA Mobilizations from t-3 to T-12    Soft tissue mobilization triggerpoint release to para-spinals    Manual Traction gentle LAD bilateral with grade II and II LAD            Trigger Point Dry Needling - 06/18/20 0001    Consent Given? Yes    Education Handout Provided Yes    Other Dry Needling lef t and right mid throacic perispinals 2 spots each;    Thoracic multifidi response Twitch response elicited;Palpable increased muscle length                PT Education - 06/17/20 1343    Education Details ther-ex and posture    Person(s) Educated Patient     Methods Explanation;Demonstration;Tactile cues;Verbal cues    Comprehension Verbalized understanding;Returned demonstration;Verbal cues required;Tactile cues required            PT Short Term Goals - 05/21/20 2127      PT SHORT TERM GOAL #1   Title Pt will be indepenent in HEP as it has been established     Time 3    Period Weeks    Status On-going      PT SHORT TERM GOAL #2   Title Patient will increase lumbar flexion by 20 degrees    Baseline is being more aware of his posture     Time 3    Period Weeks    Status On-going    Target Date 04/24/20      PT SHORT TERM GOAL #3   Title Patient will increase billateral LE strength to 5/5    Baseline Is having very little pain sitting     Time 3    Period Weeks    Status On-going    Target Date 04/24/20             PT Long Term Goals - 04/03/20 1617      PT LONG TERM GOAL #1   Title Patient will perform ADL's and IADL's without pain in his back    Time 6    Period Weeks    Status New    Target Date 05/15/20      PT LONG TERM GOAL #2   Title Patient will ambualte 3000' without increased pain    Time 6    Period Weeks    Status New    Target Date 05/15/20                 Plan - 06/17/20 1406    Clinical Impression Statement Patient continues to make progress depsite his limitations with his feeding tube site. He had mild sasming in his paraspinals. His pain tends to  corelate to his pain in the front. He will have a surgery to repair the site in the necxt few weeks. We will continue to work on his back pain until that point.    Personal Factors and Comorbidities Comorbidity 1;Comorbidity 2;Comorbidity 3+    Comorbidities anxiety, extensive histroy of stomach pain, feeding tube    Examination-Activity Limitations Carry;Lift;Squat;Stand    Stability/Clinical Decision Making Evolving/Moderate complexity    Clinical Decision Making High    Rehab Potential Good    PT Frequency 2x / week    PT Duration 6 weeks     PT Treatment/Interventions ADLs/Self Care Home Management;Cryotherapy;Electrical Stimulation;Iontophoresis 4mg /ml Dexamethasone;Moist Heat;Ultrasound;Functional mobility training;Therapeutic activities;Therapeutic exercise;Balance training;Neuromuscular re-education;Patient/family education;Manual techniques;Passive range of motion;Dry needling;Taping    PT Next Visit Plan patient had positive repsose to TPDN last episode; manual therapy to trigger points, review rotational stretching movements; Consider thoracic PA's; begin light core strengthening. Keep in mind he has had extensive stomach surgery.    PT Home Exercise Plan LTR, open book, glute stretch; tennis ball trigger poitn release    Consulted and Agree with Plan of Care Patient           Patient will benefit from skilled therapeutic intervention in order to improve the following deficits and impairments:  Increased fascial restricitons,Decreased range of motion,Increased muscle spasms,Pain,Decreased endurance,Decreased strength,Decreased mobility,Postural dysfunction  Visit Diagnosis: Chronic bilateral low back pain without sciatica  Pain in thoracic spine  Cramp and spasm  Abnormal posture     Problem List Patient Active Problem List   Diagnosis Date Noted  . Bilateral inguinal hernia 09/28/2018  . Incisional hernia 03/17/2018  . Trigger thumb, left thumb 01/19/2018  . Trigger finger, left index finger 07/18/2017  . Trigger finger, right ring finger 01/31/2017  . Trigger finger of left thumb 01/03/2017  . Trigger finger of right thumb 01/03/2017  . Trigger index finger of left hand 01/03/2017  . Trigger index finger of right hand 01/03/2017  . Malnutrition of moderate degree 07/19/2016  . Intra-abdominal abscess ([place]) 07/16/2016  . Cellulitis 07/15/2016  . S/P colostomy takedown 06/28/2016  . Pain in thoracic spine 06/09/2016  . Mid back pain 06/09/2016  . Postoperative fever 11/19/2015  . S/P partial  gastrectomy 11/19/2015  . Severe protein-calorie malnutrition ([place]) 11/17/2015  . Sepsis ([place]) 11/14/2015  . Hiccups 11/14/2015  . AKI (acute kidney injury) ([place]) 11/14/2015  . Fever   . Leg swelling   . Left shoulder pain   . Muscle spasm of left shoulder   . GI bleed 10/27/2015  . Acute blood loss anemia 10/27/2015  . Syncope 10/27/2015  . Hyperglycemia 10/27/2015  . Hypotension 10/27/2015  . Neck pain 10/27/2015  . Hematemesis 10/27/2015  . Hematochezia 10/27/2015  . Diverticulitis of colon with perforation 07/05/2015    Carney Living PT DPT  06/18/2020, 1:29 PM  [facility] [address] [place], [name], [address] Phone: [phone]   Fax:  [phone]  Name: Miguel [name] MRN: [id number] Date of Birth: Nov 22, 1950

## 2020-06-24 ENCOUNTER — Encounter: Payer: Self-pay | Admitting: Physical Therapy

## 2020-06-24 ENCOUNTER — Ambulatory Visit: Payer: Medicare Other | Admitting: Physical Therapy

## 2020-06-24 ENCOUNTER — Other Ambulatory Visit: Payer: Self-pay

## 2020-06-24 DIAGNOSIS — R293 Abnormal posture: Secondary | ICD-10-CM | POA: Diagnosis not present

## 2020-06-24 DIAGNOSIS — M546 Pain in thoracic spine: Secondary | ICD-10-CM

## 2020-06-24 DIAGNOSIS — G8929 Other chronic pain: Secondary | ICD-10-CM

## 2020-06-24 DIAGNOSIS — R252 Cramp and spasm: Secondary | ICD-10-CM

## 2020-06-24 DIAGNOSIS — M545 Low back pain, unspecified: Secondary | ICD-10-CM | POA: Diagnosis not present

## 2020-06-24 NOTE — Therapy (Signed)
[place], [name], [address] Phone: [phone]   Fax:  [phone]  Physical Therapy Treatment  Patient Details  Name: Miguel [name] MRN: [id number] Date of Birth: 1950/07/01 Referring Provider (PT): Dr Jean Rosenthal   Encounter Date: 06/24/2020   PT End of Session - 06/24/20 1353    Visit Number 11    Number of Visits 13    Date for PT Re-Evaluation 07/02/20    Authorization Type Medicare progress note done on vivit 7. Next to be done on 17    PT Start Time 1145    PT Stop Time 1228    PT Time Calculation (min) 43 min    Activity Tolerance Patient tolerated treatment well    Behavior During Therapy [facility] for tasks assessed/performed           Past Medical History:  Diagnosis Date  . Anemia   . Anxiety   . BPH (benign prostatic hyperplasia)   . Gastritis   . GERD (gastroesophageal reflux disease)    "seldom" (07/16/2016)  . GI bleed due to NSAIDs 10/27/2015  . History of blood transfusion 06/2016   post OR/notes 07/15/2016  . History of hiatal hernia   . History of kidney stones   . Hyperlipidemia   . Hypertension    no meds   . Melanoma of back ([place])    "mid back"  . Sigmoid diverticulitis    with perforation    Past Surgical History:  Procedure Laterality Date  . COLON SURGERY     sigmoid  . COLOSTOMY TAKEDOWN N/A 06/28/2016   Procedure: COLOSTOMY TAKEDOWN;  Surgeon: [name], MD;  Location: [place];  Service: General;  Laterality: N/A;  . CYSTOSCOPY WITH RETROGRADE PYELOGRAM, URETEROSCOPY AND STENT PLACEMENT Bilateral 02/12/2020   Procedure: CYSTOSCOPY WITH BILATERAL RETROGRADE PYELOGRAM,  [place];  Surgeon: Remi Haggard, MD;  [facility];  Service: Urology;  Laterality: Bilateral;  1 HR  . ESOPHAGOGASTRODUODENOSCOPY N/A 10/27/2015   Procedure: ESOPHAGOGASTRODUODENOSCOPY (EGD);  Surgeon: Clarene Essex, MD;  Location[facility] ENDOSCOPY;  Service: Endoscopy;   Laterality: N/A;  . ESOPHAGOGASTRODUODENOSCOPY N/A 10/29/2015   Procedure: ESOPHAGOGASTRODUODENOSCOPY (EGD);  Surgeon: Ronald Lobo, MD;  Location: [facility] ENDOSCOPY;  Service: Endoscopy;  Laterality: N/A;  . ESOPHAGOGASTRODUODENOSCOPY N/A 10/30/2015   Procedure: ESOPHAGOGASTRODUODENOSCOPY (EGD);  Surgeon: Clarene Essex, MD;  Location: [facility] ENDOSCOPY;  Service: Endoscopy;  Laterality: N/A;  . GASTROSTOMY TUBE PLACEMENT  11/21/2015   REDUCTION OF HIATAL HERNIA , REPAIR HIATAL HERNIA, RESECTION SMALL BOWEL WITH ANASTOMOSIS, PLACEMENT GASTROSTOMY TUBE, PLACEMENT DUODENOSTOMY TUBE (N/A)  . HEMORRHOID BANDING  X 2  . HERNIA REPAIR    . HIATAL HERNIA REPAIR N/A 11/21/2015   Procedure: REDUCTION OF HIATAL HERNIA , REPAIR HIATAL HERNIA, RESECTION SMALL BOWEL WITH ANASTOMOSIS, PLACEMENT GASTROSTOMY TUBE, PLACEMENT DUODENOSTOMY TUBE;  Surgeon: Mickeal Skinner, MD;  Location: [place];  Service: General;  Laterality: N/A;  . HOLMIUM LASER APPLICATION Right 56/81/2751   Procedure: HOLMIUM LASER APPLICATION;  Surgeon: Remi Haggard, MD;  [facility];  Service: Urology;  Laterality: Right;  . INCISIONAL HERNIA REPAIR  06/28/2016   open/notes 07/15/2016  . INCISIONAL HERNIA REPAIR  03/17/2018   WITH MESH  . INCISIONAL HERNIA REPAIR N/A 03/17/2018   Procedure: INCISIONAL HERNIA REPAIR WITH MESH;  Surgeon: [name], MD;  Location: [place];  Service: General;  Laterality: N/A;  . INGUINAL HERNIA REPAIR Bilateral 09/28/2018  . INGUINAL HERNIA REPAIR Bilateral 09/28/2018   Procedure:  BILATERAL OPEN INGUINAL HERNIA REPAIR WITH MESH;  Surgeon: [name], MD;  Location: [place];  Service: General;  Laterality: Bilateral;  GENERAL AND TAP BLOCK  . INSERTION OF MESH N/A 03/17/2018   Procedure: INSERTION OF MESH;  Surgeon: [name], MD;  Location: [place];  Service: General;  Laterality: N/A;  . INSERTION OF MESH Bilateral 09/28/2018   Procedure: Insertion Of Mesh;  Surgeon: [name], MD;   Location: [place];  Service: General;  Laterality: Bilateral;  . IR CM INJ ANY COLONIC TUBE W/FLUORO  02/04/2017  . IR GUIDED DRAIN W CATHETER PLACEMENT  07/06/2016   /NOTES 07/15/2016  . IR PATIENT EVAL TECH 0-60 MINS  06/28/2019  . IR RADIOLOGIST EVAL & MGMT  07/27/2016  . IR RADIOLOGIST EVAL & MGMT  08/17/2016  . IR RADIOLOGIST EVAL & MGMT  08/26/2016  . IR REPLACE G-TUBE SIMPLE WO FLUORO  07/28/2017  . IR REPLACE G-TUBE SIMPLE WO FLUORO  01/31/2018  . IR REPLACE G-TUBE SIMPLE WO FLUORO  10/16/2018  . IR REPLACE G-TUBE SIMPLE WO FLUORO  03/05/2019  . IR REPLACE G-TUBE SIMPLE WO FLUORO  08/22/2019  . IR REPLACE G-TUBE SIMPLE WO FLUORO  01/08/2020  . IR [place] TUBE PERCUT W/FLUORO  08/04/2016  . IR [place] TUBE PERCUT W/FLUORO  01/14/2017  . IR US GUIDE BX ASP/DRAIN  08/04/2016  . KNEE CARTILAGE SURGERY Right 1971   "opened me up"  . LAPAROTOMY N/A 07/05/2015   Procedure: PARTIAL SIGMOID COLECTOMY AND COLOSTOMY;  Surgeon: [name], MD;  Location: [place];  Service: General;  Laterality: N/A;  . MELANOMA EXCISION  2001  . 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;  Service: General;;  . REPAIR OF PERFORATED ULCER N/A 10/30/2015   Procedure: REPAIR OF BLEEDING  ULCER;  Surgeon: Judeth Horn, MD;  Location: [place];  Service: General;  Laterality: N/A;  . TUMOR EXCISION  2009   "back; fatty tumor"    There were no vitals filed for this visit.   Subjective Assessment - 06/24/20 1348    Subjective Patient reports bvery little pain in his back. his biggest problem is pain in his feeding tube site.    Pertinent History multiple hernias, Somach feeding tube, anemia, anxiety    How long can you sit comfortably? No pain    How long can you stand comfortably? pain when he stands for too long    How long can you walk comfortably? pain while walking longer distances    Diagnostic  tests x-ray: mild degenerative changes    Patient Stated Goals to be able to be active    Currently in Pain? No/denies                             [facility] Adult PT Treatment/Exercise - 06/24/20 0001      Lumbar Exercises: Stretches   Lower Trunk Rotation Limitations x20    Piriformis Stretch Limitations glute stretch 3x20 sec hold      Lumbar Exercises: Standing   Other Standing Lumbar Exercises scap retraction 2x10 red; shoulder extnesion red 2x10    Other Standing Lumbar Exercises Standing march 2x10; standing hip abduction 2x10;      Lumbar Exercises: Seated   Other Seated Lumbar Exercises bilateral ER red 2x10; Horizontal abduction redx20; wand flexion x20 improved pain today  Lumbar Exercises: Supine   Clam Limitations 2x10 red  with abdominal breathing    Bent Knee Raise Limitations supine march 2x10      Manual Therapy   Manual Therapy Joint mobilization;Manual Traction    Joint Mobilization PA Mobilizations from t-3 to T-12    Soft tissue mobilization triggerpoint release to para-spinals    Manual Traction gentle LAD bilateral with grade II and II LAD                  PT Education - 06/24/20 1349    Education Details reviewed HEP    Person(s) Educated Patient    Methods Explanation;Demonstration;Tactile cues;Verbal cues            PT Short Term Goals - 05/21/20 2127      PT SHORT TERM GOAL #1   Title Pt will be indepenent in HEP as it has been established     Time 3    Period Weeks    Status On-going      PT SHORT TERM GOAL #2   Title Patient will increase lumbar flexion by 20 degrees    Baseline is being more aware of his posture     Time 3    Period Weeks    Status On-going    Target Date 04/24/20      PT SHORT TERM GOAL #3   Title Patient will increase billateral LE strength to 5/5    Baseline Is having very little pain sitting     Time 3    Period Weeks    Status On-going    Target Date 04/24/20              PT Long Term Goals - 04/03/20 1617      PT LONG TERM GOAL #1   Title Patient will perform ADL's and IADL's without pain in his back    Time 6    Period Weeks    Status New    Target Date 05/15/20      PT LONG TERM GOAL #2   Title Patient will ambualte 3000' without increased pain    Time 6    Period Weeks    Status New    Target Date 05/15/20                 Plan - 06/24/20 1354    Clinical Impression Statement Patient is making good progress. h\He tolerated ther-ex well. He dosent have as many trigger points at this time. He will have ostomy reparied onApril 28th. We will re-assess nex visit with likley D/C to HEP.    Personal Factors and Comorbidities Comorbidity 1;Comorbidity 2;Comorbidity 3+    Comorbidities anxiety, extensive histroy of stomach pain, feeding tube    Examination-Activity Limitations Carry;Lift;Squat;Stand    Stability/Clinical Decision Making Evolving/Moderate complexity    Clinical Decision Making High    Rehab Potential Good    PT Frequency 2x / week    PT Duration 6 weeks    PT Treatment/Interventions ADLs/Self Care Home Management;Cryotherapy;Electrical Stimulation;Iontophoresis 4mg /ml Dexamethasone;Moist Heat;Ultrasound;Functional mobility training;Therapeutic activities;Therapeutic exercise;Balance training;Neuromuscular re-education;Patient/family education;Manual techniques;Passive range of motion;Dry needling;Taping    PT Next Visit Plan patient had positive repsose to TPDN last episode; manual therapy to trigger points, review rotational stretching movements; Consider thoracic PA's; begin light core strengthening. Keep in mind he has had extensive stomach surgery.    PT Home Exercise Plan LTR, open book, glute stretch; tennis ball trigger poitn release    Consulted and Agree  with Plan of Care Patient           Patient will benefit from skilled therapeutic intervention in order to improve the following deficits and impairments:  Increased  fascial restricitons,Decreased range of motion,Increased muscle spasms,Pain,Decreased endurance,Decreased strength,Decreased mobility,Postural dysfunction  Visit Diagnosis: Chronic bilateral low back pain without sciatica  Pain in thoracic spine  Cramp and spasm  Abnormal posture     Problem List Patient Active Problem List   Diagnosis Date Noted  . Bilateral inguinal hernia 09/28/2018  . Incisional hernia 03/17/2018  . Trigger thumb, left thumb 01/19/2018  . Trigger finger, left index finger 07/18/2017  . Trigger finger, right ring finger 01/31/2017  . Trigger finger of left thumb 01/03/2017  . Trigger finger of right thumb 01/03/2017  . Trigger index finger of left hand 01/03/2017  . Trigger index finger of right hand 01/03/2017  . Malnutrition of moderate degree 07/19/2016  . Intra-abdominal abscess ([place]) 07/16/2016  . Cellulitis 07/15/2016  . S/P colostomy takedown 06/28/2016  . Pain in thoracic spine 06/09/2016  . Mid back pain 06/09/2016  . Postoperative fever 11/19/2015  . 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 Severe protein-calorie malnutrition ([place]) 11/17/2015  . Sepsis ([place]) 11/14/2015  . Hiccups 11/14/2015  . AKI (acute kidney injury) ([place]) 11/14/2015  . Fever   . Leg swelling   . Left shoulder pain   . Muscle spasm of left shoulder   . GI bleed 10/27/2015  . Acute blood loss anemia 10/27/2015  . Syncope 10/27/2015  . Hyperglycemia 10/27/2015  . Hypotension 10/27/2015  . Neck pain 10/27/2015  . Hematemesis 10/27/2015  . Hematochezia 10/27/2015  . Diverticulitis of colon with perforation 07/05/2015    Carney Living PT DPT  06/24/2020, 1:59 PM  [facility] [address] [place], [name], [address] Phone: [phone]   Fax:  [phone]  Name: Miguel [name] MRN: [id number] Date of Birth: [birth year]

## 2020-07-01 ENCOUNTER — Ambulatory Visit: Payer: Medicare Other | Attending: Orthopaedic Surgery | Admitting: Physical Therapy

## 2020-07-01 ENCOUNTER — Other Ambulatory Visit: Payer: Self-pay

## 2020-07-01 DIAGNOSIS — M545 Low back pain, unspecified: Secondary | ICD-10-CM | POA: Diagnosis not present

## 2020-07-01 DIAGNOSIS — G8929 Other chronic pain: Secondary | ICD-10-CM | POA: Insufficient documentation

## 2020-07-01 DIAGNOSIS — M546 Pain in thoracic spine: Secondary | ICD-10-CM | POA: Insufficient documentation

## 2020-07-01 DIAGNOSIS — R252 Cramp and spasm: Secondary | ICD-10-CM | POA: Diagnosis not present

## 2020-07-01 DIAGNOSIS — R293 Abnormal posture: Secondary | ICD-10-CM | POA: Diagnosis not present

## 2020-07-02 NOTE — Therapy (Addendum)
[place], [name], [address] Phone: [phone]   Fax:  [phone]  Physical Therapy Treatment/Re-cert [facility]   Patient Details  Name: Miguel [name] MRN: [id number] Date of Birth: 02/18/1951 Referring Provider (PT): Dr Jean Rosenthal   Encounter Date: 07/01/2020   PT End of Session - 07/01/20 1453     Visit Number 12    Number of Visits 17    Date for PT Re-Evaluation 07/29/20    Authorization Type Medicare progress note done on vivit 7. Next to be done on 17    PT Start Time 1150    PT Stop Time 1240    PT Time Calculation (min) 50 min    Activity Tolerance Patient tolerated treatment well    Behavior During Therapy WFL for tasks assessed/performed             Past Medical History:  Diagnosis Date   Anemia    Anxiety    BPH (benign prostatic hyperplasia)    Gastritis    GERD (gastroesophageal reflux disease)    "seldom" (07/16/2016)   GI bleed due to NSAIDs 10/27/2015   History of blood transfusion 06/2016   post OR/notes 07/15/2016   History of hiatal hernia    History of kidney stones    Hyperlipidemia    Hypertension    no meds    Melanoma of back ([place])    "mid back"   Sigmoid diverticulitis    with perforation    Past Surgical History:  Procedure Laterality Date   COLON SURGERY     sigmoid   COLOSTOMY TAKEDOWN N/A 06/28/2016   Procedure: COLOSTOMY TAKEDOWN;  Surgeon: [name], MD;  [facility];  Service: General;  Laterality: N/A;   CYSTOSCOPY WITH RETROGRADE PYELOGRAM, URETEROSCOPY AND STENT PLACEMENT Bilateral 02/12/2020   Procedure: CYSTOSCOPY WITH BILATERAL RETROGRADE PYELOGRAM,  AND LITHOPEXY;  Surgeon: Remi Haggard, MD;  [facility];  Service: Urology;  Laterality: Bilateral;  1 HR   ESOPHAGOGASTRODUODENOSCOPY N/A 10/27/2015   Procedure: ESOPHAGOGASTRODUODENOSCOPY (EGD);  Surgeon: Clarene Essex, MD;  Location: [facility] ENDOSCOPY;  Service: Endoscopy;   Laterality: N/A;   ESOPHAGOGASTRODUODENOSCOPY N/A 10/29/2015   Procedure: ESOPHAGOGASTRODUODENOSCOPY (EGD);  Surgeon: Ronald Lobo, MD;  Location: [facility]COPY;  Service: Endoscopy;  Laterality: N/A;   ESOPHAGOGASTRODUODENOSCOPY N/A 10/30/2015   Procedure: ESOPHAGOGASTRODUODENOSCOPY (EGD);  Surgeon: Clarene Essex, MD;  Location: [facility] ENDOSCOPY;  Service: Endoscopy;  Laterality: N/A;   GASTROSTOMY TUBE PLACEMENT  11/21/2015   REDUCTION OF HIATAL HERNIA , REPAIR HIATAL HERNIA, RESECTION SMALL BOWEL WITH ANASTOMOSIS, PLACEMENT GASTROSTOMY TUBE, PLACEMENT DUODENOSTOMY TUBE (N/A)   HEMORRHOID BANDING  X 2   HERNIA REPAIR     HIATAL HERNIA REPAIR N/A 11/21/2015   Procedure: REDUCTION OF HIATAL HERNIA , REPAIR HIATAL HERNIA, RESECTION SMALL BOWEL WITH ANASTOMOSIS, PLACEMENT GASTROSTOMY TUBE, PLACEMENT DUODENOSTOMY TUBE;  Surgeon: Mickeal Skinner, MD;  Location: [place];  Service: General;  Laterality: N/A;   HOLMIUM LASER APPLICATION Right 70/78/6754   Procedure: HOLMIUM LASER APPLICATION;  Surgeon: Remi Haggard, MD;  [facility];  Service: Urology;  Laterality: Right;   INCISIONAL HERNIA REPAIR  06/28/2016   open/notes 07/15/2016   INCISIONAL HERNIA REPAIR  03/17/2018   WITH MESH   INCISIONAL HERNIA REPAIR N/A 03/17/2018   Procedure: INCISIONAL HERNIA REPAIR WITH MESH;  Surgeon: [name], MD;  Location: [place];  Service: General;  Laterality: N/A;   INGUINAL HERNIA REPAIR Bilateral 09/28/2018   INGUINAL HERNIA REPAIR  Bilateral 09/28/2018   Procedure: BILATERAL OPEN INGUINAL HERNIA REPAIR WITH MESH;  Surgeon: [name], MD;  Location: [place];  Service: General;  Laterality: Bilateral;  GENERAL AND TAP BLOCK   INSERTION OF MESH N/A 03/17/2018   Procedure: INSERTION OF MESH;  Surgeon: [name], MD;  Location: [place];  Service: General;  Laterality: N/A;   INSERTION OF MESH Bilateral 09/28/2018   Procedure: Insertion Of Mesh;  Surgeon: [name], MD;  Location: [place];   Service: General;  Laterality: Bilateral;   IR CM INJ ANY COLONIC TUBE W/FLUORO  02/04/2017   IR GUIDED DRAIN W CATHETER PLACEMENT  07/06/2016   /NOTES 07/15/2016   IR PATIENT EVAL TECH 0-60 MINS  06/28/2019   IR RADIOLOGIST EVAL & MGMT  07/27/2016   IR RADIOLOGIST EVAL & MGMT  08/17/2016   IR RADIOLOGIST EVAL & MGMT  08/26/2016   IR REPLACE G-TUBE SIMPLE WO FLUORO  07/28/2017   IR REPLACE G-TUBE SIMPLE WO FLUORO  01/31/2018   IR REPLACE G-TUBE SIMPLE WO FLUORO  10/16/2018   IR REPLACE G-TUBE SIMPLE WO FLUORO  03/05/2019   IR REPLACE G-TUBE SIMPLE WO FLUORO  08/22/2019   IR REPLACE G-TUBE SIMPLE WO FLUORO  01/08/2020   IR [place] GASTRO/COLONIC TUBE PERCUT W/FLUORO  08/04/2016   IR [place] GASTRO/COLONIC TUBE PERCUT W/FLUORO  01/14/2017   IR US GUIDE BX ASP/DRAIN  08/04/2016   KNEE CARTILAGE SURGERY Right 1971   "opened me up"   LAPAROTOMY N/A 07/05/2015   Procedure: PARTIAL SIGMOID COLECTOMY AND COLOSTOMY;  Surgeon: [name], MD;  Location: [place] OR;  Service: General;  Laterality: N/A;   MELANOMA EXCISION  2001   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  10/30/2015   Procedure: REMOVAL OF DISTAL STOMACH;  Surgeon: Judeth Horn, MD;  Location: [place];  Service: General;;   REPAIR OF PERFORATED ULCER N/A 10/30/2015   Procedure: REPAIR OF BLEEDING  ULCER;  Surgeon: Judeth Horn, MD;  Location: [place] OR;  Service: General;  Laterality: N/A;   TUMOR EXCISION  2009   "back; fatty tumor"    There were no vitals filed for this visit.   Subjective Assessment - 07/01/20 1450     Subjective Patient comes in today with significant pain. He reports his stoma has been draining significantly over the past few days. He has pain in that area which makes him lean forward.    Pertinent History multiple hernias, [place] feeding tube, anemia, anxiety    How long can you sit comfortably? No pain    How long can you stand comfortably? pain when he stands for too long    How long can you walk comfortably? pain while  walking longer distances    Patient Stated Goals to be able to be active    Currently in Pain? Yes    Pain Score 7     Pain Location Back    Pain Orientation Left;Right;Mid    Pain Descriptors / Indicators Aching    Pain Type Chronic pain    Pain Onset More than a month ago    Pain Frequency Intermittent    Aggravating Factors  when his stoma area hurts    Pain Relieving Factors stretching and needling    Effect of Pain on Daily Activities pain with activity                [facility] PT Assessment - 07/02/20 0001       Assessment   Medical Diagnosis  Low Back/Mid throacic pain    Referring Provider (PT) Dr Jean Rosenthal      AROM   Lumbar Flexion 50    Lumbar Extension 15      PROM   Overall PROM Comments imrpoved genral hip mobility      Strength   Right Hip Flexion 4+/5    Right Hip ABduction 4+/5    Left Hip Flexion 4+/5    Left Hip ABduction 4+/5    Right Knee Extension 4+/5    Left Knee Extension 4+/5      Palpation   Palpation comment significant                           [facility] Adult PT Treatment/Exercise - 07/02/20 0001       Manual Therapy   Manual Therapy Joint mobilization;Manual Traction    Joint Mobilization PA Mobilizations from t-3 to T-12    Soft tissue mobilization triggerpoint release to para-spinals    Manual Traction gentle LAD bilateral with grade II and II LAD                    PT Education - 07/01/20 1452     Education Details reviewed HEP and symptom mangement    Person(s) Educated Patient    Methods Explanation;Demonstration;Tactile cues;Verbal cues    Comprehension Verbalized understanding;Returned demonstration;Verbal cues required;Tactile cues required              PT Short Term Goals - 07/01/20 1457       PT SHORT TERM GOAL #1   Title Pt will be indepenent in HEP as it has been established     Time 3    Period Weeks    Status On-going      PT SHORT TERM GOAL #2   Title Patient  will increase lumbar flexion by 20 degrees    Baseline is being more aware of his posture     Time 3    Period Weeks    Status On-going      PT SHORT TERM GOAL #3   Title Patient will increase billateral LE strength to 5/5    Baseline Is having very little pain sitting     Time 3    Period Weeks    Status On-going    Target Date 04/24/20               PT Long Term Goals - 04/03/20 1617       PT LONG TERM GOAL #1   Title Patient will perform ADL's and IADL's without pain in his back    Time 6    Period Weeks    Status New    Target Date 05/15/20      PT LONG TERM GOAL #2   Title Patient will ambualte 3000' without increased pain    Time 6    Period Weeks    Status New    Target Date 05/15/20                   Plan - 07/01/20 1453     Clinical Impression Statement The intial planwas to D/C to HEP but the patient has had an exacerbation of pain. Overall the patient has a good HEP to work on at home. He will have surgery to repair his stoma site on 07/24/2020. We will see him 1-2 more visits to reduce pain and improve posture in that time  frame. Wewill then discharge and see how his surgivcal procedure goes. Today we focused on trigger point dry needling and manual therapy. he reported improved pain with treatment.    Personal Factors and Comorbidities Comorbidity 1;Comorbidity 2;Comorbidity 3+    Comorbidities anxiety, extensive histroy of stomach pain, feeding tube    Examination-Activity Limitations Carry;Lift;Squat;Stand    Stability/Clinical Decision Making Evolving/Moderate complexity    Clinical Decision Making Moderate    Rehab Potential Good    PT Frequency 1x / week    PT Duration 4 weeks    PT Treatment/Interventions ADLs/Self Care Home Management;Cryotherapy;Electrical Stimulation;Iontophoresis 46m/ml Dexamethasone;Moist Heat;Ultrasound;Functional mobility training;Therapeutic activities;Therapeutic exercise;Balance training;Neuromuscular  re-education;Patient/family education;Manual techniques;Passive range of motion;Dry needling;Taping    PT Next Visit Plan patient had positive repsose to TPDN last episode; manual therapy to trigger points, review rotational stretching movements; Consider thoracic PA's; begin light core strengthening. Keep in mind he has had extensive stomach surgery.    PT Home Exercise Plan LTR, open book, glute stretch; tennis ball trigger poitn release    Consulted and Agree with Plan of Care Patient             Patient will benefit from skilled therapeutic intervention in order to improve the following deficits and impairments:  Increased fascial restricitons,Decreased range of motion,Increased muscle spasms,Pain,Decreased endurance,Decreased strength,Decreased mobility,Postural dysfunction  Visit Diagnosis: Chronic bilateral low back pain without sciatica - Plan: PT plan of care cert/re-cert  Pain in thoracic spine - Plan: PT plan of care cert/re-cert  Cramp and spasm - Plan: PT plan of care cert/re-cert  Abnormal posture - Plan: PT plan of care cert/re-cert   PHYSICAL THERAPY DISCHARGE SUMMARY  Visits from Start of Care: 12  Current functional level related to goals / functional outcomes: Improved pain    Remaining deficits: Pain at times but able to manage    Education / Equipment: HEP   Patient agrees to discharge. Patient goals were met. Patient is being discharged due to meeting the stated rehab goals.   Problem List Patient Active Problem List   Diagnosis Date Noted   Bilateral inguinal hernia 09/28/2018   Incisional hernia 03/17/2018   Trigger thumb, left thumb 01/19/2018   Trigger finger, left index finger 07/18/2017   Trigger finger, right ring finger 01/31/2017   Trigger finger of left thumb 01/03/2017   Trigger finger of right thumb 01/03/2017   Trigger index finger of left hand 01/03/2017   Trigger index finger of right hand 01/03/2017   Malnutrition of moderate  degree 07/19/2016   Intra-abdominal abscess ([place] 07/16/2016   Cellulitis 07/15/2016   S/P colostomy takedown 06/28/2016   Pain in thoracic spine 06/09/2016   Mid back pain 06/09/2016   Postoperative fever 11/19/2015   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   Severe protein-calorie malnutrition ([place] 11/17/2015   Sepsis ([facility] 11/14/2015   Hiccups 11/14/2015   AKI (acute kidney injury) ([place] 11/14/2015   Fever    Leg swelling    Left shoulder pain    Muscle spasm of left shoulder    GI bleed 10/27/2015   Acute blood loss anemia 10/27/2015   Syncope 10/27/2015   Hyperglycemia 10/27/2015   Hypotension 10/27/2015   Neck pain 10/27/2015   Hematemesis 10/27/2015   Hematochezia 10/27/2015   Diverticulitis of colon with perforation 07/05/2015    DCarney LivingPT DPT  07/02/2020, 8:12 AM  [place] [address]Phone: [phone]  Fax:  [phone] Name: Miguel Reichmann  MARQUEZ [name] MRN: [id number] Date of Birth: 1950/05/18

## 2020-07-21 ENCOUNTER — Other Ambulatory Visit (HOSPITAL_COMMUNITY)
Admission: RE | Admit: 2020-07-21 | Discharge: 2020-07-21 | Disposition: A | Discharge status: Home / Self Care | Payer: Medicare Other | Source: Ambulatory Visit | Attending: Surgery | Admitting: Surgery

## 2020-07-21 DIAGNOSIS — Z01812 Encounter for preprocedural laboratory examination: Secondary | ICD-10-CM | POA: Diagnosis not present

## 2020-07-21 DIAGNOSIS — Z20822 Contact with and (suspected) exposure to covid-19: Secondary | ICD-10-CM | POA: Diagnosis not present

## 2020-07-22 LAB — SARS CORONAVIRUS 2 (TAT 6-24 HRS): SARS Coronavirus 2: NEGATIVE

## 2020-07-23 ENCOUNTER — Other Ambulatory Visit: Payer: Self-pay

## 2020-07-23 ENCOUNTER — Encounter (HOSPITAL_COMMUNITY): Payer: Self-pay | Admitting: Surgery

## 2020-07-23 NOTE — Progress Notes (Signed)
Anesthesia Chart Review:  Case: [id number] Date/Time: 07/24/20 0715   Procedure: CLOSURE OF GASTROCUTANEOUS FISTULA (N/A ) - 60 MINUTES   Anesthesia type: General   Pre-op diagnosis: GASTROCUTANEUS FISTULA   Location: [place] OR ROOM 09 / [place] [facility]   Surgeons: [name], MD      DISCUSSION: Patient is a 70 year old male scheduled for the above procedure.  History includes never smoker, HTN, HLD, perforated sigmoid diverticulitis (s/p sigmoid colectomy with end colostomy 07/05/15; colostomy takedown, segmental colectomy, incisional hernia repair 06/28/16; incisional hernia repair 03/17/18), GI bleed/bleeding gastric ulcer with large hiatal hernia (s/p repair of ulcer, partial distal gastrectomy with Bilroth II reconstruction/gastrojejunostomy 10/30/15; s/p reduction and repair of hiatal hernia, resection of small bowel with anastomosis placement of G-tube and duodenostomy tube 11/21/15), anemia, melanoma (s/p excision 2001), GERD, anxiety, inguinal hernia (s/p bilateral IHR 09/28/18). S/p cystoscopy, cystolitholapaxy of Hutch bladder diverticular stones using holmium laser on 02/12/20.  EKG showed stable LAFB. 07/21/20 COVID-19 test negative. He is a same day work-up, so labs and anesthesia team evaluation on the day of surgery. No g-tube and denied SOB and chest pain per PAT RN phone RN.    VS: Ht 5\' 4"  (1.626 m)   Wt 55.3 kg   BMI 20.94 kg/m   BP Readings from Last 3 Encounters:  02/12/20 (!) 141/78  02/06/20 (!) 144/75  09/28/18 135/88   Pulse Readings from Last 3 Encounters:  02/12/20 69  02/06/20 75  09/28/18 81    PROVIDERS: Alroy Dust, L.Marlou Sa, MD is PCP    LABS: Day of surgery. As of 02/06/20, Cr 0.88, H/H 12.5/37.7, PLT 196.   EKG: 02/06/20: Normal sinus rhythm Left anterior fascicular block Abnormal ECG Confirmed by Ida Rogue [phone]) on 02/07/2020 1:55:21 PM - No significant changes when compared to 03/09/18 tracing.   CV: N/A  Past Medical History:  Diagnosis Date   . Anemia   . Anxiety   . BPH (benign prostatic hyperplasia)   . Gastritis   . GERD (gastroesophageal reflux disease)    "seldom" (07/16/2016)  . GI bleed due to NSAIDs 10/27/2015  . History of blood transfusion 06/2016   post OR/notes 07/15/2016  . History of hiatal hernia   . History of kidney stones    surgery to remove stone  . Hyperlipidemia   . Hypertension    no meds   . Melanoma of back ([place])    "mid back"  . Sigmoid diverticulitis    with perforation    Past Surgical History:  Procedure Laterality Date  . COLON SURGERY     sigmoid  . COLOSTOMY TAKEDOWN N/A 06/28/2016   Procedure: COLOSTOMY TAKEDOWN;  Surgeon: [name], MD;  Location: [place];  Service: General;  Laterality: N/A;  . CYSTOSCOPY WITH RETROGRADE PYELOGRAM, URETEROSCOPY AND STENT PLACEMENT Bilateral 02/12/2020   Procedure: CYSTOSCOPY WITH BILATERAL RETROGRADE PYELOGRAM,  [place];  Surgeon: Remi Haggard, MD;  [facility];  Service: Urology;  Laterality: Bilateral;  1 HR  . ESOPHAGOGASTRODUODENOSCOPY N/A 10/27/2015   Procedure: ESOPHAGOGASTRODUODENOSCOPY (EGD);  Surgeon: Clarene Essex, MD;  Location: [facility] ENDOSCOPY;  Service: Endoscopy;  Laterality: N/A;  . ESOPHAGOGASTRODUODENOSCOPY N/A 10/29/2015   Procedure: ESOPHAGOGASTRODUODENOSCOPY (EGD);  Surgeon: Ronald Lobo, MD;  Location: [facility] ENDOSCOPY;  Service: Endoscopy;  Laterality: N/A;  . ESOPHAGOGASTRODUODENOSCOPY N/A 10/30/2015   Procedure: ESOPHAGOGASTRODUODENOSCOPY (EGD);  Surgeon: Clarene Essex, MD;  Location: [facility] ENDOSCOPY;  Service: Endoscopy;  Laterality: N/A;  . GASTROSTOMY TUBE PLACEMENT  11/21/2015   REDUCTION OF HIATAL  HERNIA , REPAIR HIATAL HERNIA, RESECTION SMALL BOWEL WITH ANASTOMOSIS, PLACEMENT GASTROSTOMY TUBE, PLACEMENT DUODENOSTOMY TUBE (N/A)  . HEMORRHOID BANDING  X 2  . HERNIA REPAIR    . HIATAL HERNIA REPAIR N/A 11/21/2015   Procedure: REDUCTION OF HIATAL HERNIA , REPAIR HIATAL HERNIA, RESECTION SMALL BOWEL WITH ANASTOMOSIS,  PLACEMENT GASTROSTOMY TUBE, PLACEMENT DUODENOSTOMY TUBE;  Surgeon: Mickeal Skinner, MD;  Location: [place];  Service: General;  Laterality: N/A;  . HOLMIUM LASER APPLICATION Right 40/98/1191   Procedure: HOLMIUM LASER APPLICATION;  Surgeon: Remi Haggard, MD;  [facility];  Service: Urology;  Laterality: Right;  . INCISIONAL HERNIA REPAIR  06/28/2016   open/notes 07/15/2016  . INCISIONAL HERNIA REPAIR  03/17/2018   WITH MESH  . INCISIONAL HERNIA REPAIR N/A 03/17/2018   Procedure: INCISIONAL HERNIA REPAIR WITH MESH;  Surgeon: [name], MD;  Location: [place];  Service: General;  Laterality: N/A;  . INGUINAL HERNIA REPAIR Bilateral 09/28/2018  . INGUINAL HERNIA REPAIR Bilateral 09/28/2018   Procedure: BILATERAL OPEN INGUINAL HERNIA REPAIR WITH MESH;  Surgeon: [name], MD;  Location: [place];  Service: General;  Laterality: Bilateral;  GENERAL AND TAP BLOCK  . INSERTION OF MESH N/A 03/17/2018   Procedure: INSERTION OF MESH;  Surgeon: [name], MD;  Location: [place];  Service: General;  Laterality: N/A;  . INSERTION OF MESH Bilateral 09/28/2018   Procedure: Insertion Of Mesh;  Surgeon: [name], MD;  Location: [place];  Service: General;  Laterality: Bilateral;  . IR CM INJ ANY COLONIC TUBE W/FLUORO  02/04/2017  . IR GUIDED DRAIN W CATHETER PLACEMENT  07/06/2016   /NOTES 07/15/2016  . IR PATIENT EVAL TECH 0-60 MINS  06/28/2019  . IR RADIOLOGIST EVAL & MGMT  07/27/2016  . IR RADIOLOGIST EVAL & MGMT  08/17/2016  . IR RADIOLOGIST EVAL & MGMT  08/26/2016  . IR REPLACE G-TUBE SIMPLE WO FLUORO  07/28/2017  . IR REPLACE G-TUBE SIMPLE WO FLUORO  01/31/2018  . IR REPLACE G-TUBE SIMPLE WO FLUORO  10/16/2018  . IR REPLACE G-TUBE SIMPLE WO FLUORO  03/05/2019  . IR REPLACE G-TUBE SIMPLE WO FLUORO  08/22/2019  . IR REPLACE G-TUBE SIMPLE WO FLUORO  01/08/2020  . IR [place] TUBE PERCUT W/FLUORO  08/04/2016  . IR [place] TUBE PERCUT W/FLUORO  01/14/2017  . IR  US GUIDE BX ASP/DRAIN  08/04/2016  . KNEE CARTILAGE SURGERY Right 1971   "opened me up"  . LAPAROTOMY N/A 07/05/2015   Procedure: PARTIAL SIGMOID COLECTOMY AND COLOSTOMY;  Surgeon: [name], MD;  Location: [place];  Service: General;  Laterality: N/A;  . MELANOMA EXCISION  2001  . 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  10/30/2015   Procedure: REMOVAL OF DISTAL STOMACH;  Surgeon: Judeth Horn, MD;  Location: [place];  Service: General;;  . REPAIR OF PERFORATED ULCER N/A 10/30/2015   Procedure: REPAIR OF BLEEDING  ULCER;  Surgeon: Judeth Horn, MD;  Location: [place];  Service: General;  Laterality: N/A;  . TUMOR EXCISION  2009   "back; fatty tumor"    MEDICATIONS: No current facility-administered medications for this encounter.   Marland Kitchen acetaminophen (TYLENOL) 500 MG tablet  . diclofenac sodium (VOLTAREN) 1 % GEL  . famotidine (PEPCID) 20 MG tablet  . LORazepam (ATIVAN) 1 MG tablet  . methocarbamol (ROBAXIN) 500 MG tablet  . Multiple Vitamin (MULTIVITAMIN WITH MINERALS) TABS tablet  . ondansetron (ZOFRAN-ODT) 4 MG disintegrating tablet  . Oxycodone HCl 10 MG TABS  .  pantoprazole (PROTONIX) 40 MG tablet  . polyethylene glycol (MIRALAX / GLYCOLAX) 17 g packet  . Probiotic Product (PROBIOTIC DAILY PO)  . tamsulosin (FLOMAX) 0.4 MG CAPS capsule  . tiZANidine (ZANAFLEX) 2 MG tablet  . traMADol (ULTRAM) 50 MG tablet  . vitamin B-12 (CYANOCOBALAMIN) 1000 MCG tablet  . zolpidem (AMBIEN) 10 MG tablet   . 0.9 % NaCl with KCl 20 mEq/ L  infusion  . acetaminophen (TYLENOL) tablet 650 mg   Or  . acetaminophen (TYLENOL) suppository 650 mg  . morphine 4 MG/ML injection 1-4 mg  . ondansetron (ZOFRAN-ODT) disintegrating tablet 4 mg   Or  . ondansetron (ZOFRAN) 4 mg in sodium chloride 0.9 % 50 mL IVPB  . oxyCODONE (Oxy IR/ROXICODONE) immediate release tablet 5-10 mg    Myra Gianotti, PA-C Surgical Short Stay/Anesthesiology [facility] Phone [phone] [facility] Phone [phone] 07/23/2020 11:42 AM

## 2020-07-23 NOTE — H&P (Signed)
Miguel [name] is an 70 y.o. male.   Chief Complaint: gastrocutaneous fistula HPI: This is a pleasant 69 year old gentleman with a significant surgical history.  Most recently, he was able to stop his chronic tube feeds and maintained his weight well.  His G-tube was removed in late January.  Unfortunately, the site has continued to drain bile and will not close.  He is otherwise doing well  Past Medical History:  Diagnosis Date  . Anemia   . Anxiety   . BPH (benign prostatic hyperplasia)   . Gastritis   . GERD (gastroesophageal reflux disease)    "seldom" (07/16/2016)  . GI bleed due to NSAIDs 10/27/2015  . History of blood transfusion 06/2016   post OR/notes 07/15/2016  . History of hiatal hernia   . History of kidney stones    surgery to remove stone  . Hyperlipidemia   . Hypertension    no meds   . Melanoma of back ([place])    "mid back"  . Sigmoid diverticulitis    with perforation    Past Surgical History:  Procedure Laterality Date  . COLON SURGERY     sigmoid  . COLOSTOMY TAKEDOWN N/A 06/28/2016   Procedure: COLOSTOMY TAKEDOWN;  Surgeon: [name], MD;  Location: [place];  Service: General;  Laterality: N/A;  . CYSTOSCOPY WITH RETROGRADE PYELOGRAM, URETEROSCOPY AND STENT PLACEMENT Bilateral 02/12/2020   Procedure: CYSTOSCOPY WITH BILATERAL RETROGRADE PYELOGRAM,  [place];  Surgeon: Remi Haggard, MD;  Location: [facility];  Service: Urology;  Laterality: Bilateral;  1 HR  . ESOPHAGOGASTRODUODENOSCOPY N/A 10/27/2015   Procedure: ESOPHAGOGASTRODUODENOSCOPY (EGD);  Surgeon: Clarene Essex, MD;  Location: [facility] ENDOSCOPY;  Service: Endoscopy;  Laterality: N/A;  . ESOPHAGOGASTRODUODENOSCOPY N/A 10/29/2015   Procedure: ESOPHAGOGASTRODUODENOSCOPY (EGD);  Surgeon: Ronald Lobo, MD;  Location: [facility] ENDOSCOPY;  Service: Endoscopy;  Laterality: N/A;  . ESOPHAGOGASTRODUODENOSCOPY N/A 10/30/2015   Procedure: ESOPHAGOGASTRODUODENOSCOPY (EGD);  Surgeon: Clarene Essex, MD;  Location: [facility]  ENDOSCOPY;  Service: Endoscopy;  Laterality: N/A;  . g tube discontinued  04/2020   per patient  . GASTROSTOMY TUBE PLACEMENT  11/21/2015   REDUCTION OF HIATAL HERNIA , REPAIR HIATAL HERNIA, RESECTION SMALL BOWEL WITH ANASTOMOSIS, PLACEMENT GASTROSTOMY TUBE, PLACEMENT DUODENOSTOMY TUBE (N/A)  . HEMORRHOID BANDING  X 2  . HERNIA REPAIR    . HIATAL HERNIA REPAIR N/A 11/21/2015   Procedure: REDUCTION OF HIATAL HERNIA , REPAIR HIATAL HERNIA, RESECTION SMALL BOWEL WITH ANASTOMOSIS, PLACEMENT GASTROSTOMY TUBE, PLACEMENT DUODENOSTOMY TUBE;  Surgeon: Mickeal Skinner, MD;  Location: [place];  Service: General;  Laterality: N/A;  . HOLMIUM LASER APPLICATION Right 42/35/3614   Procedure: HOLMIUM LASER APPLICATION;  Surgeon: Remi Haggard, MD;  Location: [facility];  Service: Urology;  Laterality: Right;  . INCISIONAL HERNIA REPAIR  06/28/2016   open/notes 07/15/2016  . INCISIONAL HERNIA REPAIR  03/17/2018   WITH MESH  . INCISIONAL HERNIA REPAIR N/A 03/17/2018   Procedure: INCISIONAL HERNIA REPAIR WITH MESH;  Surgeon: [name], MD;  Location: [place];  Service: General;  Laterality: N/A;  . INGUINAL HERNIA REPAIR Bilateral 09/28/2018  . INGUINAL HERNIA REPAIR Bilateral 09/28/2018   Procedure: BILATERAL OPEN INGUINAL HERNIA REPAIR WITH MESH;  Surgeon: [name], MD;  Location: [place];  Service: General;  Laterality: Bilateral;  GENERAL AND TAP BLOCK  . INSERTION OF MESH N/A 03/17/2018   Procedure: INSERTION OF MESH;  Surgeon: [name], MD;  Location: [place];  Service: General;  Laterality: N/A;  . INSERTION OF  MESH Bilateral 09/28/2018   Procedure: Insertion Of Mesh;  Surgeon: [name], MD;  Location: [place];  Service: General;  Laterality: Bilateral;  . IR CM INJ ANY COLONIC TUBE W/FLUORO  02/04/2017  . IR GUIDED DRAIN W CATHETER PLACEMENT  07/06/2016   /NOTES 07/15/2016  . IR PATIENT EVAL TECH 0-60 MINS  06/28/2019  . IR RADIOLOGIST EVAL & MGMT  07/27/2016  . IR RADIOLOGIST  EVAL & MGMT  08/17/2016  . IR RADIOLOGIST EVAL & MGMT  08/26/2016  . IR REPLACE G-TUBE SIMPLE WO FLUORO  07/28/2017  . IR REPLACE G-TUBE SIMPLE WO FLUORO  01/31/2018  . IR REPLACE G-TUBE SIMPLE WO FLUORO  10/16/2018  . IR REPLACE G-TUBE SIMPLE WO FLUORO  03/05/2019  . IR REPLACE G-TUBE SIMPLE WO FLUORO  08/22/2019  . IR REPLACE G-TUBE SIMPLE WO FLUORO  01/08/2020  . IR [place] TUBE PERCUT W/FLUORO  08/04/2016  . IR [place] TUBE PERCUT W/FLUORO  01/14/2017  . IR US GUIDE BX ASP/DRAIN  08/04/2016  . KNEE CARTILAGE SURGERY Right 1971   "opened me up"  . LAPAROTOMY N/A 07/05/2015   Procedure: PARTIAL SIGMOID COLECTOMY AND COLOSTOMY;  Surgeon: [name], MD;  Location: [place];  Service: General;  Laterality: N/A;  . MELANOMA EXCISION  2001  . 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 REPAIR OF PERFORATED ULCER N/A 10/30/2015   Procedure: REPAIR OF BLEEDING  ULCER;  Surgeon: Judeth Horn, MD;  Location: [place];  Service: General;  Laterality: N/A;  . TUMOR EXCISION  2009   "back; fatty tumor"    Family History  Problem Relation Age of Onset  . Stroke Mother   . Stroke Brother   . Heart disease Brother    Social History:  reports that he has never smoked. His smokeless tobacco use includes snuff. He reports that he does not drink alcohol and does not use drugs.  Allergies: No Known Allergies  No medications prior to admission.    No results found for this or any previous visit (from the past 48 hour(s)). No results found.  Review of Systems  Constitutional: Negative for fever.  Respiratory: Negative for cough and shortness of breath.   Cardiovascular: Negative for chest pain.  All other systems reviewed and are negative.   Height 5\' 4"  (1.626 m), weight 55.3 kg. Physical Exam Constitutional:      General: He is not in acute  distress.    Appearance: Normal appearance.  HENT:     Head: Normocephalic and atraumatic.     Right Ear: External ear normal.     Left Ear: External ear normal.     Nose: Nose normal.  Eyes:     General: No scleral icterus.    Pupils: Pupils are equal, round, and reactive to light.  Cardiovascular:     Rate and Rhythm: Normal rate and regular rhythm.     Pulses: Normal pulses.     Heart sounds: Normal heart sounds.  Abdominal:     General: Abdomen is flat. There is no distension.     Hernia: A hernia is present.     Comments: G-tube site in the LUQ draining bile  Musculoskeletal:        General: Normal range of motion.     Cervical back: Normal range of motion.  Skin:    General:  Skin is warm and dry.  Neurological:     General: No focal deficit present.     Mental Status: He is alert.  Psychiatric:        Mood and Affect: Mood normal.        Behavior: Behavior normal.      Assessment/Plan Gastrocutaneous fistula  This has failed to completely close after G-tube removal. Will proceed to the OR for an attempt at closure with a mini-laparotomy. We discussed the risks which include but are not limited to bleeding, infection, recurrence of the fistula, injury to surrounding structures, the need for further procedures, cardiopulmonary problems, etc. He agrees to proceed.  [name], MD 07/23/2020, 7:04 PM

## 2020-07-23 NOTE — Progress Notes (Signed)
Patient denies shortness of breath, fever, cough or chest pain.  PCP - Dr L. Donnie Coffin Cardiologist - n/a  Chest x-ray - n/a EKG - 02/06/20 Stress Test - n/a ECHO - n/a Cardiac Cath - n/a  ERAS: Clear liquids til 4:30 am DOS.  Anesthesia review: Yes  STOP now taking any Aspirin (unless otherwise instructed by your surgeon), Aleve, Naproxen, Ibuprofen, Motrin, Advil, Goody's, BC's, all herbal medications, fish oil, and all vitamins.   Coronavirus Screening Covid test on 07/21/20 was negative.  Patient verbalized understanding of instructions that were given via phone.

## 2020-07-23 NOTE — Anesthesia Preprocedure Evaluation (Addendum)
Anesthesia Evaluation  Patient identified by MRN, date of birth, ID band Patient awake    Reviewed: Allergy & Precautions, NPO status , Patient's Chart, lab work & pertinent test results  History of Anesthesia Complications Negative for: history of anesthetic complications  Airway Mallampati: II  TM Distance: >3 FB Neck ROM: Full    Dental no notable dental hx.    Pulmonary neg pulmonary ROS,    Pulmonary exam normal        Cardiovascular hypertension, Normal cardiovascular exam  LAFB   Neuro/Psych Anxiety negative neurological ROS     GI/Hepatic Neg liver ROS, hiatal hernia, GERD  Controlled and Medicated,Gastrocutaneous fistula  perforated sigmoid diverticulitis (s/p sigmoid colectomy with end colostomy 07/05/15; colostomy takedown, segmental colectomy, incisional hernia repair 06/28/16; incisional hernia repair 03/17/18), GI bleed/bleeding gastric ulcer with large hiatal hernia (s/p repair of ulcer, partial distal gastrectomy with Bilroth II reconstruction/gastrojejunostomy 10/30/15; s/p reduction and repair of hiatal hernia, resection of small bowel with anastomosis placement of G-tube and duodenostomy tube 11/21/15   Endo/Other  negative endocrine ROS  Renal/GU negative Renal ROS  negative genitourinary   Musculoskeletal negative musculoskeletal ROS (+)   Abdominal   Peds  Hematology negative hematology ROS (+)   Anesthesia Other Findings Day of surgery medications reviewed with patient.  Reproductive/Obstetrics negative OB ROS                            Anesthesia Physical Anesthesia Plan  ASA: III  Anesthesia Plan: General   Post-op Pain Management:    Induction: Intravenous  PONV Risk Score and Plan: 4 or greater and Treatment may vary due to age or medical condition, Dexamethasone, Ondansetron and Midazolam  Airway Management Planned: Oral ETT  Additional Equipment:  None  Intra-op Plan:   Post-operative Plan: Extubation in OR  Informed Consent: I have reviewed the patients History and Physical, chart, labs and discussed the procedure including the risks, benefits and alternatives for the proposed anesthesia with the patient or authorized representative who has indicated his/her understanding and acceptance.     Dental advisory given  Plan Discussed with: CRNA  Anesthesia Plan Comments:        Anesthesia Quick Evaluation

## 2020-07-24 ENCOUNTER — Inpatient Hospital Stay (HOSPITAL_COMMUNITY): Payer: Medicare Other | Admitting: Vascular Surgery

## 2020-07-24 ENCOUNTER — Encounter (HOSPITAL_COMMUNITY)
Admission: RE | Disposition: A | Discharge status: Home / Self Care | Payer: Self-pay | Source: Home / Self Care | Attending: Surgery

## 2020-07-24 ENCOUNTER — Observation Stay (HOSPITAL_COMMUNITY)
Admission: RE | Admit: 2020-07-24 | Discharge: 2020-07-26 | Disposition: A | Discharge status: Home / Self Care | Payer: Medicare Other | Attending: Surgery | Admitting: Surgery

## 2020-07-24 ENCOUNTER — Encounter (HOSPITAL_COMMUNITY): Payer: Self-pay | Admitting: Surgery

## 2020-07-24 ENCOUNTER — Other Ambulatory Visit: Payer: Self-pay

## 2020-07-24 DIAGNOSIS — I10 Essential (primary) hypertension: Secondary | ICD-10-CM | POA: Diagnosis not present

## 2020-07-24 DIAGNOSIS — N179 Acute kidney failure, unspecified: Secondary | ICD-10-CM | POA: Diagnosis not present

## 2020-07-24 DIAGNOSIS — D62 Acute posthemorrhagic anemia: Secondary | ICD-10-CM | POA: Diagnosis not present

## 2020-07-24 DIAGNOSIS — K316 Fistula of stomach and duodenum: Secondary | ICD-10-CM | POA: Diagnosis not present

## 2020-07-24 DIAGNOSIS — E43 Unspecified severe protein-calorie malnutrition: Secondary | ICD-10-CM | POA: Diagnosis not present

## 2020-07-24 DIAGNOSIS — M25461 Effusion, right knee: Secondary | ICD-10-CM

## 2020-07-24 DIAGNOSIS — R262 Difficulty in walking, not elsewhere classified: Secondary | ICD-10-CM | POA: Insufficient documentation

## 2020-07-24 LAB — CBC
HCT: 38.9 % — ABNORMAL LOW (ref 39.0–52.0)
Hemoglobin: 13 g/dL (ref 13.0–17.0)
MCH: 32.3 pg (ref 26.0–34.0)
MCHC: 33.4 g/dL (ref 30.0–36.0)
MCV: 96.5 fL (ref 80.0–100.0)
Platelets: 224 10*3/uL (ref 150–400)
RBC: 4.03 MIL/uL — ABNORMAL LOW (ref 4.22–5.81)
RDW: 12 % (ref 11.5–15.5)
WBC: 4.2 10*3/uL (ref 4.0–10.5)
nRBC: 0 % (ref 0.0–0.2)

## 2020-07-24 LAB — BASIC METABOLIC PANEL
Anion gap: 7 (ref 5–15)
BUN: 13 mg/dL (ref 8–23)
CO2: 30 mmol/L (ref 22–32)
Calcium: 8.9 mg/dL (ref 8.9–10.3)
Chloride: 100 mmol/L (ref 98–111)
Creatinine, Ser: 0.95 mg/dL (ref 0.61–1.24)
GFR, Estimated: >60 mL/min (ref >60)
Glucose, Bld: 93 mg/dL (ref 70–99)
Potassium: 3.5 mmol/L (ref 3.5–5.1)
Sodium: 137 mmol/L (ref 135–145)

## 2020-07-24 SURGERY — CLOSURE, FISTULA, GASTROCUTANEOUS
Anesthesia: General | Site: Abdomen

## 2020-07-24 MED ORDER — ORAL CARE MOUTH RINSE: 15.0000 mL | Freq: Once | OROMUCOSAL | Status: AC

## 2020-07-24 MED ORDER — OXYCODONE HCL 5 MG PO TABS
ORAL_TABLET | ORAL | Status: AC
  Filled 2020-07-24: qty 1

## 2020-07-24 MED ORDER — PROPOFOL 10 MG/ML IV BOLUS
INTRAVENOUS | Status: AC
  Filled 2020-07-24: qty 20

## 2020-07-24 MED ORDER — DEXAMETHASONE SODIUM PHOSPHATE 10 MG/ML IJ SOLN
INTRAMUSCULAR | Status: DC | PRN
  Administered 2020-07-24: 5 mg via INTRAVENOUS

## 2020-07-24 MED ORDER — ROCURONIUM BROMIDE 10 MG/ML (PF) SYRINGE
PREFILLED_SYRINGE | INTRAVENOUS | Status: DC | PRN
  Administered 2020-07-24: 40 mg via INTRAVENOUS

## 2020-07-24 MED ORDER — CHLORHEXIDINE GLUCONATE CLOTH 2 % EX PADS: 6.0000 | MEDICATED_PAD | Freq: Once | CUTANEOUS | Status: DC

## 2020-07-24 MED ORDER — FENTANYL CITRATE (PF) 100 MCG/2ML IJ SOLN
INTRAMUSCULAR | Status: AC
  Filled 2020-07-24: qty 2

## 2020-07-24 MED ORDER — FENTANYL CITRATE (PF) 250 MCG/5ML IJ SOLN
INTRAMUSCULAR | Status: DC | PRN
  Administered 2020-07-24: 50 ug via INTRAVENOUS

## 2020-07-24 MED ORDER — PANTOPRAZOLE SODIUM 40 MG IV SOLR
40.0000 mg | Freq: Every day | INTRAVENOUS | Status: DC
  Administered 2020-07-24 – 2020-07-25 (×2): 40 mg via INTRAVENOUS
  Filled 2020-07-24 (×2): qty 40

## 2020-07-24 MED ORDER — SODIUM CHLORIDE 0.9 % IV SOLN
INTRAVENOUS | Status: DC
  Administered 2020-07-24: 990 mL via INTRAVENOUS

## 2020-07-24 MED ORDER — DIPHENHYDRAMINE HCL 50 MG/ML IJ SOLN: 12.5000 mg | Freq: Four times a day (QID) | INTRAMUSCULAR | Status: DC | PRN

## 2020-07-24 MED ORDER — ROCURONIUM BROMIDE 10 MG/ML (PF) SYRINGE
PREFILLED_SYRINGE | INTRAVENOUS | Status: AC
  Filled 2020-07-24: qty 10

## 2020-07-24 MED ORDER — LACTATED RINGERS IV SOLN: INTRAVENOUS | Status: DC

## 2020-07-24 MED ORDER — LIDOCAINE 2% (20 MG/ML) 5 ML SYRINGE
INTRAMUSCULAR | Status: DC | PRN
  Administered 2020-07-24: 60 mg via INTRAVENOUS

## 2020-07-24 MED ORDER — ONDANSETRON HCL 4 MG/2ML IJ SOLN: 4.0000 mg | Freq: Four times a day (QID) | INTRAMUSCULAR | Status: DC | PRN

## 2020-07-24 MED ORDER — 0.9 % SODIUM CHLORIDE (POUR BTL) OPTIME
TOPICAL | Status: DC | PRN
  Administered 2020-07-24 (×2): 1000 mL

## 2020-07-24 MED ORDER — HYDROMORPHONE HCL 1 MG/ML IJ SOLN
1.0000 mg | INTRAMUSCULAR | Status: DC | PRN
  Administered 2020-07-24 – 2020-07-26 (×4): 1 mg via INTRAVENOUS
  Filled 2020-07-24 (×4): qty 1

## 2020-07-24 MED ORDER — OXYCODONE HCL 5 MG PO TABS
5.0000 mg | ORAL_TABLET | Freq: Once | ORAL | Status: AC | PRN
  Administered 2020-07-24: 5 mg via ORAL

## 2020-07-24 MED ORDER — ACETAMINOPHEN 500 MG PO TABS
1000.0000 mg | ORAL_TABLET | Freq: Once | ORAL | Status: AC
  Administered 2020-07-24: 1000 mg via ORAL
  Filled 2020-07-24: qty 2

## 2020-07-24 MED ORDER — DIPHENHYDRAMINE HCL 12.5 MG/5ML PO ELIX: 12.5000 mg | ORAL_SOLUTION | Freq: Four times a day (QID) | ORAL | Status: DC | PRN

## 2020-07-24 MED ORDER — GABAPENTIN 300 MG PO CAPS
300.0000 mg | ORAL_CAPSULE | Freq: Two times a day (BID) | ORAL | Status: DC
  Administered 2020-07-24 – 2020-07-26 (×5): 300 mg via ORAL
  Filled 2020-07-24 (×5): qty 1

## 2020-07-24 MED ORDER — ONDANSETRON HCL 4 MG/2ML IJ SOLN
INTRAMUSCULAR | Status: DC | PRN
  Administered 2020-07-24: 4 mg via INTRAVENOUS

## 2020-07-24 MED ORDER — PHENYLEPHRINE 40 MCG/ML (10ML) SYRINGE FOR IV PUSH (FOR BLOOD PRESSURE SUPPORT)
PREFILLED_SYRINGE | INTRAVENOUS | Status: AC
  Filled 2020-07-24: qty 10

## 2020-07-24 MED ORDER — DEXAMETHASONE SODIUM PHOSPHATE 10 MG/ML IJ SOLN
INTRAMUSCULAR | Status: AC
  Filled 2020-07-24: qty 1

## 2020-07-24 MED ORDER — ONDANSETRON HCL 4 MG/2ML IJ SOLN
INTRAMUSCULAR | Status: AC
  Filled 2020-07-24: qty 2

## 2020-07-24 MED ORDER — BUPIVACAINE-EPINEPHRINE 0.25% -1:200000 IJ SOLN
INTRAMUSCULAR | Status: DC | PRN
  Administered 2020-07-24: 10 mL

## 2020-07-24 MED ORDER — PROMETHAZINE HCL 25 MG/ML IJ SOLN
INTRAMUSCULAR | Status: AC
  Filled 2020-07-24: qty 1

## 2020-07-24 MED ORDER — FENTANYL CITRATE (PF) 100 MCG/2ML IJ SOLN
25.0000 ug | INTRAMUSCULAR | Status: DC | PRN
  Administered 2020-07-24 (×3): 50 ug via INTRAVENOUS

## 2020-07-24 MED ORDER — BUPIVACAINE-EPINEPHRINE (PF) 0.25% -1:200000 IJ SOLN
INTRAMUSCULAR | Status: AC
  Filled 2020-07-24: qty 30

## 2020-07-24 MED ORDER — PROPOFOL 10 MG/ML IV BOLUS
INTRAVENOUS | Status: DC | PRN
  Administered 2020-07-24: 90 mg via INTRAVENOUS

## 2020-07-24 MED ORDER — FENTANYL CITRATE (PF) 250 MCG/5ML IJ SOLN
INTRAMUSCULAR | Status: AC
  Filled 2020-07-24: qty 5

## 2020-07-24 MED ORDER — SODIUM CHLORIDE 0.9 % IV SOLN
2.0000 g | INTRAVENOUS | Status: AC
  Administered 2020-07-25: 2 g via INTRAVENOUS
  Filled 2020-07-24: qty 2

## 2020-07-24 MED ORDER — METHOCARBAMOL 500 MG PO TABS
500.0000 mg | ORAL_TABLET | Freq: Four times a day (QID) | ORAL | Status: DC | PRN
  Administered 2020-07-25: 500 mg via ORAL
  Filled 2020-07-24: qty 1

## 2020-07-24 MED ORDER — PROMETHAZINE HCL 25 MG/ML IJ SOLN
6.2500 mg | INTRAMUSCULAR | Status: DC | PRN
  Administered 2020-07-24: 6.25 mg via INTRAVENOUS

## 2020-07-24 MED ORDER — LIDOCAINE 2% (20 MG/ML) 5 ML SYRINGE
INTRAMUSCULAR | Status: AC
  Filled 2020-07-24: qty 5

## 2020-07-24 MED ORDER — OXYCODONE HCL 5 MG PO TABS
5.0000 mg | ORAL_TABLET | ORAL | Status: DC | PRN
  Administered 2020-07-24 – 2020-07-26 (×5): 10 mg via ORAL
  Filled 2020-07-24 (×5): qty 2

## 2020-07-24 MED ORDER — ONDANSETRON 4 MG PO TBDP: 4.0000 mg | ORAL_TABLET | Freq: Four times a day (QID) | ORAL | Status: DC | PRN

## 2020-07-24 MED ORDER — CEFAZOLIN SODIUM-DEXTROSE 2-4 GM/100ML-% IV SOLN
2.0000 g | INTRAVENOUS | Status: AC
  Administered 2020-07-24: 2 g via INTRAVENOUS
  Filled 2020-07-24: qty 100

## 2020-07-24 MED ORDER — TRAMADOL HCL 50 MG PO TABS: 50.0000 mg | ORAL_TABLET | Freq: Four times a day (QID) | ORAL | Status: DC | PRN

## 2020-07-24 MED ORDER — CHLORHEXIDINE GLUCONATE 0.12 % MT SOLN
15.0000 mL | Freq: Once | OROMUCOSAL | Status: AC
  Administered 2020-07-24: 15 mL via OROMUCOSAL
  Filled 2020-07-24: qty 15

## 2020-07-24 MED ORDER — ENOXAPARIN SODIUM 40 MG/0.4ML IJ SOSY
40.0000 mg | PREFILLED_SYRINGE | INTRAMUSCULAR | Status: DC
  Administered 2020-07-25 – 2020-07-26 (×2): 40 mg via SUBCUTANEOUS
  Filled 2020-07-24 (×2): qty 0.4

## 2020-07-24 MED ORDER — OXYCODONE HCL 5 MG/5ML PO SOLN: 5.0000 mg | Freq: Once | ORAL | Status: AC | PRN

## 2020-07-24 SURGICAL SUPPLY — 37 items
APL PRP STRL LF DISP 70% ISPRP (MISCELLANEOUS) ×1
BLADE CLIPPER SURG (BLADE) ×1 IMPLANT
CANISTER SUCT 3000ML PPV (MISCELLANEOUS) ×2 IMPLANT
CHLORAPREP W/TINT 26 (MISCELLANEOUS) ×2 IMPLANT
COVER SURGICAL LIGHT HANDLE (MISCELLANEOUS) ×2 IMPLANT
DRAPE LAPAROSCOPIC ABDOMINAL (DRAPES) ×2 IMPLANT
DRAPE WARM FLUID 44X44 (DRAPES) ×2 IMPLANT
DRSG PAD ABDOMINAL 8X10 ST (GAUZE/BANDAGES/DRESSINGS) ×1 IMPLANT
ELECT CAUTERY BLADE 6.4 (BLADE) ×2 IMPLANT
ELECT REM PT RETURN 9FT ADLT (ELECTROSURGICAL) ×2
ELECTRODE REM PT RTRN 9FT ADLT (ELECTROSURGICAL) ×1 IMPLANT
GAUZE SPONGE 2X2 8PLY STRL LF (GAUZE/BANDAGES/DRESSINGS) IMPLANT
GAUZE SPONGE 4X4 12PLY STRL (GAUZE/BANDAGES/DRESSINGS) ×1 IMPLANT
GOWN STRL REUS W/ TWL LRG LVL3 (GOWN DISPOSABLE) ×2 IMPLANT
GOWN STRL REUS W/TWL LRG LVL3 (GOWN DISPOSABLE) ×2
HANDLE SUCTION POOLE (INSTRUMENTS) ×1 IMPLANT
KIT BASIN OR (CUSTOM PROCEDURE TRAY) ×2 IMPLANT
KIT TURNOVER KIT B (KITS) ×2 IMPLANT
NDL HYPO 25GX1X1/2 BEV (NEEDLE) IMPLANT
NEEDLE HYPO 25GX1X1/2 BEV (NEEDLE) ×2 IMPLANT
NS IRRIG 1000ML POUR BTL (IV SOLUTION) ×4 IMPLANT
PACK GENERAL/GYN (CUSTOM PROCEDURE TRAY) ×2 IMPLANT
PAD ARMBOARD 7.5X6 YLW CONV (MISCELLANEOUS) ×2 IMPLANT
PENCIL SMOKE EVACUATOR (MISCELLANEOUS) ×2 IMPLANT
SPONGE GAUZE 2X2 STER 10/PKG (GAUZE/BANDAGES/DRESSINGS) ×1
STAPLER VISISTAT 35W (STAPLE) IMPLANT
SUCTION POOLE HANDLE (INSTRUMENTS) ×2
SUT NOVA NAB GS-21 0 18 T12 DT (SUTURE) ×1 IMPLANT
SUT SILK 2 0 SH CR/8 (SUTURE) ×2 IMPLANT
SUT SILK 2 0 TIES 10X30 (SUTURE) ×2 IMPLANT
SUT SILK 3 0 SH CR/8 (SUTURE) ×2 IMPLANT
SUT SILK 3 0 TIES 10X30 (SUTURE) ×2 IMPLANT
SUT SILK 3 0SH CR/8 30 (SUTURE) ×1 IMPLANT
SUT VIC AB 3-0 SH 27 (SUTURE) ×4
SUT VIC AB 3-0 SH 27X BRD (SUTURE) IMPLANT
SYR CONTROL 10ML LL (SYRINGE) ×1 IMPLANT
TOWEL GREEN STERILE (TOWEL DISPOSABLE) ×2 IMPLANT

## 2020-07-24 NOTE — Transfer of Care (Signed)
Immediate Anesthesia Transfer of Care Note  Patient: Miguel [name]  Procedure(s) Performed: CLOSURE OF GASTROCUTANEOUS FISTULA (N/A Abdomen)  Patient Location: PACU  Anesthesia Type:General  Level of Consciousness: awake, alert  and oriented  Airway & Oxygen Therapy: Patient Spontanous Breathing and Patient connected to nasal cannula oxygen  Post-op Assessment: Report given to RN and Post -op Vital signs reviewed and stable  Post vital signs: Reviewed and stable  Last Vitals:  Vitals Value Taken Time  BP 182/92 07/24/20 0824  Temp    Pulse 80 07/24/20 0827  Resp 14 07/24/20 0827  SpO2 99 % 07/24/20 0827  Vitals shown include unvalidated device data.  Last Pain:  Vitals:   07/24/20 0618  PainSc: 0-No pain      Patients Stated Pain Goal: 4 (08/65/78 4696)  Complications: No complications documented.

## 2020-07-24 NOTE — Anesthesia Postprocedure Evaluation (Signed)
Anesthesia Post Note  Patient: Miguel [name]  Procedure(s) Performed: CLOSURE OF GASTROCUTANEOUS FISTULA (N/A Abdomen)     Patient location during evaluation: PACU Anesthesia Type: General Level of consciousness: awake and alert and oriented Pain management: pain level controlled Vital Signs Assessment: post-procedure vital signs reviewed and stable Respiratory status: spontaneous breathing, nonlabored ventilation and respiratory function stable Cardiovascular status: blood pressure returned to baseline Postop Assessment: no apparent nausea or vomiting Anesthetic complications: no   No complications documented.  Last Vitals:  Vitals:   07/24/20 0900 07/24/20 0930  BP: (!) 166/82 139/75  Pulse: 71 66  Resp: 16 11  Temp:  36.6 C  SpO2: 94% 95%    Last Pain:  Vitals:   07/24/20 0937  PainSc: [place]

## 2020-07-24 NOTE — Progress Notes (Signed)
Patient arrived to unit from PACU. Miguel [name], assessed site/skin with me. I changed the dressing upon admission due to saturation. Changed with 4x4 gauze, ABD pad, and medipore tape. Pain managed with PRN oxy 5-10 mg. NS started at 50 mlL/hr per order. Wife at beside. Patient voided 200 mL in urinal upon admission.

## 2020-07-24 NOTE — Progress Notes (Signed)
Upon examining his dressing, it was saturated with blood. Dressing taken down and applied pressure dressing to site, will continue to monitor.

## 2020-07-24 NOTE — Anesthesia Procedure Notes (Signed)
Procedure Name: Intubation Date/Time: 07/24/2020 7:36 AM Performed by: Bufford Spikes, CRNA Pre-anesthesia Checklist: Patient identified, Emergency Drugs available, Suction available and Patient being monitored Patient Re-evaluated:Patient Re-evaluated prior to induction Oxygen Delivery Method: Circle system utilized Preoxygenation: Pre-oxygenation with 100% oxygen Induction Type: IV induction Ventilation: Mask ventilation without difficulty Laryngoscope Size: Miller and 2 Tube type: Oral Tube size: 7.0 mm Number of attempts: 1 Airway Equipment and Method: Stylet and Oral airway Placement Confirmation: ETT inserted through vocal cords under direct vision,  positive ETCO2 and breath sounds checked- equal and bilateral Secured at: 21 cm Tube secured with: Tape Dental Injury: Teeth and Oropharynx as per pre-operative assessment

## 2020-07-24 NOTE — Interval H&P Note (Signed)
History and Physical Interval Note:no change in H and P  07/24/2020 7:07 AM  Miguel [name]  has presented today for surgery, with the diagnosis of [place].  The various methods of treatment have been discussed with the patient and family. After consideration of risks, benefits and other options for treatment, the patient has consented to  Procedure(s) with comments: CLOSURE OF GASTROCUTANEOUS FISTULA (N/A) - 60 MINUTES as a surgical intervention.  The patient's history has been reviewed, patient examined, no change in status, stable for surgery.  I have reviewed the patient's chart and labs.  Questions were answered to the patient's satisfaction.     [name]

## 2020-07-24 NOTE — Op Note (Signed)
CLOSURE OF GASTROCUTANEOUS FISTULA  Procedure Note  Miguel [name] 07/24/2020   Pre-op Diagnosis: GASTROCUTANEUS FISTULA SECONDARY TO GASTROSTOMY TUBE     Post-op Diagnosis: same  Procedure(s): CLOSURE OF GASTROCUTANEOUS FISTULA  Surgeon(s): [name], MD  Anesthesia: General  Staff:  Circulator: Celene Squibb, RN Scrub Person: Rometta Emery, RN  Estimated Blood Loss: Minimal               Indications: This is a 70 year old gentleman who has had multiple surgical procedures including a partial gastrectomy for bleeding ulcer.  This partial gastrectomy necrosis requiring emergent surgery and a gastrostomy tube placement through a small stomach remnant.  This occurred back in 2018.  He was finally able to come off of the tube feeds late last year and the gastrostomy tube was finally removed in January 2022.  The gastrocutaneous fistula has persisted despite the tube being removed therefore the decision was made to proceed to the operating room.  Procedure: The patient was brought to the operating room identifies correct patient.  He is placed on the operating table general anesthesia was induced.  His abdomen was prepped and draped in usual sterile fashion.  There was a opening in the left upper quadrant from the gastrostomy to it was leaking bile.  I performed elliptical incision with a scalpel around this area transversely.  I then excised the surrounding skin around the gastrocutaneous fistula.  I was then able to follow the fistula track down to the fascia.  I can free of the surrounding fascia and can visualize surrounding gastric serosa around the fistula tract.  I freed this up circumferentially without entering the peritoneal cavity.  I then closed the fistula tract with interrupted 3-0 silk sutures.  Healthy tissue appeared to be reapproximated.  The gastric remnant stay fixated to the abdominal wall.  I then reapproximated the posterior fascia incorporating the stomach  with interrupted 3-0 silk sutures as well.  I then closed the overlying fascia with interrupted 0 Novafil sutures.  I irrigated the wound copiously with normal saline.  I then placed the reapproximated some of the deep subcutaneous tissue with interrupted silk sutures and then anesthetized the surrounding fascia with Marcaine.  I then packed the wound open with a saline soaked 2 x 2 gauze.  Dry gauze and tape were placed over this.  He tolerated the procedure well.  All the counts were correct at the end of the procedure.  The patient was then extubated in the operating room and taken in a stable condition to the recovery room.          [name]   Date: 07/24/2020  Time: 8:15 AM

## 2020-07-25 DIAGNOSIS — M25461 Effusion, right knee: Secondary | ICD-10-CM | POA: Diagnosis not present

## 2020-07-25 DIAGNOSIS — R262 Difficulty in walking, not elsewhere classified: Secondary | ICD-10-CM | POA: Diagnosis not present

## 2020-07-25 DIAGNOSIS — K316 Fistula of stomach and duodenum: Secondary | ICD-10-CM | POA: Diagnosis not present

## 2020-07-25 DIAGNOSIS — I10 Essential (primary) hypertension: Secondary | ICD-10-CM | POA: Diagnosis not present

## 2020-07-25 LAB — SYNOVIAL CELL COUNT + DIFF, W/ CRYSTALS
Eosinophils-Synovial: 0 % (ref 0–1)
Lymphocytes-Synovial Fld: 8 % (ref 0–20)
Monocyte-Macrophage-Synovial Fluid: 4 % — ABNORMAL LOW (ref 50–90)
Neutrophil, Synovial: 88 % — ABNORMAL HIGH (ref 0–25)
WBC, Synovial: 48750 /mm3 — ABNORMAL HIGH (ref 0–200)

## 2020-07-25 MED ORDER — ACETAMINOPHEN 325 MG PO TABS
650.0000 mg | ORAL_TABLET | Freq: Four times a day (QID) | ORAL | Status: DC | PRN
  Administered 2020-07-25: 650 mg via ORAL
  Filled 2020-07-25: qty 2

## 2020-07-25 MED ORDER — OXYCODONE HCL 10 MG PO TABS: 5.0000 mg | ORAL_TABLET | Freq: Four times a day (QID) | ORAL | 0 refills | Status: DC | PRN

## 2020-07-25 NOTE — Progress Notes (Signed)
Physical Therapy Evaluation Patient Details Name: Miguel [name] MRN: [id number] DOB: [birth year] Today's Date: 07/25/2020   History of Present Illness  Pt is a 70 y/o male who is S/p closure of gastrocutaneous fistula on 4/28. During admission, pt also devleoped increased R knee pain and swelling. PMH includes HTN, multiple abdominal surgeries and s/p Gtube and removal.  Clinical Impression  Pt admitted secondary to problem above with deficits below. Pt very limited secondary to pain in RLE. Was unable to bear weight on RLE secondary to pain and unable to take steps this session using RW. Noted R knee to be very swollen and was unable to fully extend at knee. Pt requiring min A for bed mobility and transfers. Feel once pain is controlled, pt will progress well, however, currently do not feel pt will be able to ambulate or perform ADLs at home. Feel he would benefit from continued acute PT to progress mobility prior to return home. Will continue to follow acutely.     Follow Up Recommendations Other (comment) (TBD pending progression)    Equipment Recommendations  3in1 (PT);Rolling walker with 5" wheels;Wheelchair (measurements PT);Wheelchair cushion (measurements PT)    Recommendations for Other Services       Precautions / Restrictions Precautions Precautions: Fall Restrictions Weight Bearing Restrictions: No      Mobility  Bed Mobility Overal bed mobility: Needs Assistance Bed Mobility: Supine to Sit;Sit to Supine     Supine to sit: Min assist Sit to supine: Min assist   General bed mobility comments: Min A for RLE and trunk assist during bed mobility. Increased time required secondary to pain.    Transfers Overall transfer level: Needs assistance Equipment used: Rolling walker (2 wheeled) Transfers: Sit to/from Stand Sit to Stand: Min assist         General transfer comment: Min A for lift assist and steadying to stand. Was unable to bear weight on RLE during  mobility and unable to take steps at EOB.  Ambulation/Gait                Stairs            Wheelchair Mobility    Modified Rankin (Stroke Patients Only)       Balance Overall balance assessment: Needs assistance Sitting-balance support: No upper extremity supported;Feet supported Sitting balance-Leahy Scale: Fair     Standing balance support: Bilateral upper extremity supported;During functional activity Standing balance-Leahy Scale: Poor Standing balance comment: Reliant on BUE support                             Pertinent Vitals/Pain Pain Assessment: 0-10 Pain Score: 10-Worst pain ever Pain Location: R knee Pain Descriptors / Indicators: Grimacing;Guarding Pain Intervention(s): Monitored during session;Limited activity within patient's tolerance;Repositioned    Home Living Family/patient expects to be discharged to:: Private residence Living Arrangements: Spouse/significant other Available Help at Discharge: Family Type of Home: House Home Access: Stairs to enter Entrance Stairs-Rails: Right;Can reach Software engineer of Steps: 2 Home Layout: One level Home Equipment: None      Prior Function Level of Independence: Independent         Comments: Pt reports he is normally independent     Hand Dominance        Extremity/Trunk Assessment   Upper Extremity Assessment Upper Extremity Assessment: Overall WFL for tasks assessed    Lower Extremity Assessment Lower Extremity Assessment: RLE deficits/detail RLE Deficits / Details: Pt  unable to achieve full knee extension secondary to pain. Noted increased redness and swelling as well.       Communication   Communication: No difficulties  Cognition Arousal/Alertness: Awake/alert Behavior During Therapy: WFL for tasks assessed/performed Overall Cognitive Status: Within Functional Limits for tasks assessed                                         General Comments General comments (skin integrity, edema, etc.): Pt reports his knee has been swollen before like this and received a shot that helped.    Exercises General Exercises - Lower Extremity Heel Slides: PROM;Right;15 reps;Limitations Heel Slides Limitations: Pt unable to achieve full extension   Assessment/Plan    PT Assessment Patient needs continued PT services  PT Problem List Decreased balance;Decreased activity tolerance;Decreased knowledge of precautions;Pain       PT Treatment Interventions DME instruction;Gait training;Functional mobility training;Therapeutic activities;Therapeutic exercise;Balance training;Stair training;Patient/family education    PT Goals (Current goals can be found in the Care Plan section)  Acute Rehab PT Goals Patient Stated Goal: to decrease pain and go home PT Goal Formulation: With patient Time For Goal Achievement: 08/08/20 Potential to Achieve Goals: Good    Frequency Min 3X/week   Barriers to discharge        Co-evaluation               AM-PAC PT "6 Clicks" Mobility  Outcome Measure Help needed turning from your back to your side while in a flat bed without using bedrails?: A Little Help needed moving from lying on your back to sitting on the side of a flat bed without using bedrails?: A Little Help needed moving to and from a bed to a chair (including a wheelchair)?: A Lot Help needed standing up from a chair using your arms (e.g., wheelchair or bedside chair)?: A Little Help needed to walk in hospital room?: A Lot Help needed climbing 3-5 steps with a railing? : Total 6 Click Score: 14    End of Session Equipment Utilized During Treatment: Gait belt Activity Tolerance: Patient limited by pain Patient left: in bed;with call bell/phone within reach Nurse Communication: Mobility status PT Visit Diagnosis: Difficulty in walking, not elsewhere classified (R26.2);Pain Pain - Right/Left: Right Pain - part of body: Knee  (abdomen)    Time: 4782-9562 PT Time Calculation (min) (ACUTE ONLY): 24 min   Charges:   PT Evaluation $PT Eval Moderate Complexity: 1 Mod PT Treatments $Therapeutic Activity: 8-22 mins        Lou Miner, DPT  Acute Rehabilitation Services  Pager: [phone] Office: [phone]   Rudean Hitt 07/25/2020, 11:26 AM

## 2020-07-25 NOTE — Progress Notes (Signed)
   07/25/20 1530  Assess: MEWS Score  Temp (!) 102 F (38.9 C)  Assess: MEWS Score  MEWS Temp 2  MEWS Systolic 0  MEWS Pulse 1  MEWS RR 0  MEWS LOC 0  MEWS Score 3  MEWS Score Color Yellow  Assess: if the MEWS score is Yellow or Red  Were vital signs taken at a resting state? Yes  Focused Assessment No change from prior assessment  Treat  MEWS Interventions Administered scheduled meds/treatments  Take Vital Signs  Increase Vital Sign Frequency  Yellow: Q 2hr X 2 then Q 4hr X 2, if remains yellow, continue Q 4hrs  Escalate  MEWS: Escalate Yellow: discuss with charge nurse/RN and consider discussing with provider and RRT  Notify: Charge Nurse/RN  Name of Charge Nurse/RN Notified Tina, RN  Date Charge Nurse/RN Notified 07/25/20  Time Charge Nurse/RN Notified [id number]  Notify: Provider  Provider Name/Title Ninfa Linden MD  Date Provider Notified 07/25/20  Time Provider Notified 1530  Notification Type Call  Notification Reason Change in status  Provider response See new orders  Date of Provider Response 07/25/20  Time of Provider Response 1602  Document  Patient Outcome Stabilized after interventions  Progress note created (see row info) Yes

## 2020-07-25 NOTE — Consult Note (Signed)
Reason for Consultation:  Right knee pain with effusion Consulting Provider:  Evlyn Courier, MD, CCS  I was asked to see the patient in consultation since he is a regular patient of mine that I have seen as an outpatient.  He was admitted yesterday for some type of abdominal procedure.  He was supposed to be discharged today but this morning developed significant right knee pain and swelling.  According to the patient he has not had swelling like this recently.  He denies a history of gout.  He is afebrile.  Examination of his right knee does show a significant knee joint effusion.  The knee is warm but not red.  I am able to flex it back and forth but it is painful.  I was able to aspirate 60 cc of fluid from his right knee.  This was a darker yellow color and to me seems more consistent with gout.  I did place a steroid injection in his right knee following that aspiration.  Hopefully this will help his symptoms improved significantly enough to discharge him to home by tomorrow.  I did send off the aspiration for cell count and crystal analysis.  I can certainly see him as an outpatient.  Obviously, if things worsen with his right knee I can be contacted about.  I will communicate with the primary team as to my findings and treatment plan.

## 2020-07-25 NOTE — Discharge Summary (Signed)
Physician Discharge Summary  Patient ID: Miguel [name] MRN: [id number] DOB/AGE: May 09, 1950 70 y.o.  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  Discharged home POD#1  Consults: None  Significant Diagnostic Studies:   Treatments: surgery: closure of gastrocutaneous fistula  Discharge Exam: Blood pressure (!) 146/79, pulse 67, temperature (!) 97.4 F (36.3 C), temperature source Oral, resp. rate 15, height 5\' 4"  (1.626 m), weight 55.8 kg, SpO2 97 %. General appearance: alert, cooperative and no distress Resp: clear to auscultation bilaterally Cardio: regular rate and rhythm, S1, S2 normal, no murmur, click, rub or gallop Incision/Wound:abdomen soft, wound clean  Disposition: Discharge disposition: 01-Home or Self Care        Allergies as of 07/25/2020   No Known Allergies     Medication List    TAKE these medications   acetaminophen 500 MG tablet Commonly known as: TYLENOL Take 1 tablet (500 mg total) by mouth every 8 (eight) hours as needed for mild pain (for pain). What changed: reasons to take this   diclofenac sodium 1 % Gel Commonly known as: VOLTAREN Apply 2 g topically 3 (three) times daily as needed (joint pain).   famotidine 20 MG tablet Commonly known as: PEPCID Take 20 mg by mouth 2 (two) times daily.   LORazepam 1 MG tablet Commonly known as: ATIVAN Take 1 mg by mouth 2 (two) times daily as needed for anxiety.   methocarbamol 500 MG tablet Commonly known as: ROBAXIN Take 250-500 mg by mouth every 8 (eight) hours as needed for muscle spasms.   multivitamin with minerals Tabs tablet Take 1 tablet by mouth daily.   ondansetron 4 MG disintegrating tablet Commonly known as: ZOFRAN-ODT Take 4 mg by mouth every 6 (six) hours as needed for  nausea/vomiting.   Oxycodone HCl 10 MG Tabs Take 0.5 tablets (5 mg total) by mouth every 6 (six) hours as needed. What changed: how much to take   pantoprazole 40 MG tablet Commonly known as: PROTONIX Take 40 mg by mouth 2 (two) times daily.   polyethylene glycol 17 g packet Commonly known as: MIRALAX / GLYCOLAX Take 17 g by mouth daily as needed for mild constipation.   PROBIOTIC DAILY PO Take 1 capsule by mouth at bedtime.   tamsulosin 0.4 MG Caps capsule Commonly known as: FLOMAX Take 1 capsule (0.4 mg total) by mouth daily.   tiZANidine 2 MG tablet Commonly known as: ZANAFLEX Take 2 mg by mouth 3 (three) times daily.   traMADol 50 MG tablet Commonly known as: ULTRAM Take 50 mg by mouth 3 (three) times daily as needed for moderate pain.   vitamin B-12 1000 MCG tablet Commonly known as: CYANOCOBALAMIN Take 1,000 mcg by mouth daily.   zolpidem 10 MG tablet Commonly known as: AMBIEN Take 5-10 mg by mouth at bedtime.       Follow-up Information    [name], MD Follow up in 2 week(s).   Specialty: General Surgery Contact information: [place] [address] [phone]               Signed: [name] 07/25/2020, 6:40 AM

## 2020-07-25 NOTE — Progress Notes (Signed)
Patient complains that his knee is killing him after getting from bed to chair and ambulating to the bathroom. States that this has happened before while inpatient and Dr. Jean Rosenthal gave him a shot in the knee and made it better. Contacted Dr Kathrynn Speed and he is unavailable to see the patient today as he is in surgeries today. Dr Evlyn Courier elects to try PT evaluation. If able patient will discharge today and follow up with ortho outpatient. Order placed for imminent discharge PT. Will await result of PT evaluation.

## 2020-07-25 NOTE — TOC Initial Note (Signed)
Transition of Care [facility]) - Initial/Assessment Note    Patient Details  Name: Miguel [name] MRN: [id number] Date of Birth: 15-Nov-1950  Transition of Care [facility]) CM/SW Contact:    Marilu Favre, RN Phone Number: 07/25/2020, 2:14 PM  Clinical Narrative:                  Spoke to patient and his wife Diane at bedside.   PT recommending walker, 3 in 1 and wheel chair for home. Patient requesting DME be delivered to home. Contact for delivery is Diane [phone]. DME ordered with Freda Munro with [employer] . [employer] will call Diane.   TOC will continue to follow for progress OP PT vs HHPT etc       Patient Goals and CMS Choice Patient states their goals for this hospitalization and ongoing recovery are:: to return to home CMS Medicare.gov Compare Post Acute Care list provided to:: Patient    Expected Discharge Plan and Services     Discharge Planning Services: CM Consult   Living arrangements for the past 2 months: Single Family Home Expected Discharge Date: 07/25/20               DME Arranged: 3-N-1,Walker rolling,Wheelchair manual DME Agency: [employer] Date DME Agency Contacted: 07/25/20 Time DME Agency Contacted: [phone] Representative spoke with at DME Agency: [place]: NA          Prior Living Arrangements/Services Living arrangements for the past 2 months: [place] Lives with:: Spouse Patient language and need for interpreter reviewed:: Yes Do you feel safe going back to the place where you live?: Yes      Need for Family Participation in Patient Care: Yes (Comment) Care giver support system in place?: Yes (comment)   Criminal Activity/Legal Involvement Pertinent to Current Situation/Hospitalization: No - Comment as needed  Activities of Daily Living Home Assistive Devices/Equipment: None ADL Screening (condition at time of admission) Patient's cognitive ability adequate to safely complete daily activities?: Yes Is the patient deaf or  have difficulty hearing?: No Does the patient have difficulty seeing, even when wearing glasses/contacts?: No Does the patient have difficulty concentrating, remembering, or making decisions?: No Patient able to express need for assistance with ADLs?: Yes Does the patient have difficulty dressing or bathing?: No Independently performs ADLs?: Yes (appropriate for developmental age) Does the patient have difficulty walking or climbing stairs?: No Weakness of Legs: Both Weakness of Arms/Hands: Both  Permission Sought/Granted   Permission granted to share information with : Yes, Verbal Permission Granted  Share Information with NAME: spouse Diane           Emotional Assessment Appearance:: Appears stated age Attitude/Demeanor/Rapport: Engaged Affect (typically observed): Accepting Orientation: : Oriented to Self,Oriented to Place,Oriented to  Time,Oriented to Situation Alcohol / Substance Use: Not Applicable Psych Involvement: No (comment)  Admission diagnosis:  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 Bilateral inguinal hernia 09/28/2018  . Incisional hernia 03/17/2018  . Trigger thumb, left thumb 01/19/2018  . Trigger finger, left index finger 07/18/2017  . Trigger finger, right ring finger 01/31/2017  . Trigger finger of left thumb 01/03/2017  . Trigger finger of right thumb 01/03/2017  . Trigger index finger of left hand 01/03/2017  . Trigger index finger of right hand 01/03/2017  . Malnutrition of moderate degree 07/19/2016  . Intra-abdominal abscess ([place]) 07/16/2016  .  Cellulitis 07/15/2016  . S/P colostomy takedown 06/28/2016  . Pain in thoracic spine 06/09/2016  . Mid back pain 06/09/2016  . Postoperative fever 11/19/2015  . 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 Severe protein-calorie malnutrition ([place]) 11/17/2015  . Sepsis ([place])  11/14/2015  . Hiccups 11/14/2015  . AKI (acute kidney injury) ([place]) 11/14/2015  . Fever   . Leg swelling   . Left shoulder pain   . Muscle spasm of left shoulder   . GI bleed 10/27/2015  . Acute blood loss anemia 10/27/2015  . Syncope 10/27/2015  . Hyperglycemia 10/27/2015  . Hypotension 10/27/2015  . Neck pain 10/27/2015  . Hematemesis 10/27/2015  . Hematochezia 10/27/2015  . Diverticulitis of colon with perforation 07/05/2015   PCP:  Alroy Dust, L.Marlou Sa, MD Pharmacy:   [id number] - [place], [place] [address] [name] [address] Phone: [phone] Fax: [phone]     Social Determinants of Health (SDOH) Interventions    Readmission Risk Interventions No flowsheet data found.

## 2020-07-25 NOTE — Discharge Instructions (Signed)
Ok to shower today  Cover wound with dry guaze daily.  Expect some drainage  No lifting more than 15 to 20 pounds for 4 weeks  Resume regular diet this evening

## 2020-07-26 DIAGNOSIS — K316 Fistula of stomach and duodenum: Secondary | ICD-10-CM | POA: Diagnosis not present

## 2020-07-26 DIAGNOSIS — I10 Essential (primary) hypertension: Secondary | ICD-10-CM | POA: Diagnosis not present

## 2020-07-26 DIAGNOSIS — R262 Difficulty in walking, not elsewhere classified: Secondary | ICD-10-CM | POA: Diagnosis not present

## 2020-07-26 LAB — CBC
HCT: 39 % (ref 39.0–52.0)
Hemoglobin: 13.2 g/dL (ref 13.0–17.0)
MCH: 32.3 pg (ref 26.0–34.0)
MCHC: 33.8 g/dL (ref 30.0–36.0)
MCV: 95.4 fL (ref 80.0–100.0)
Platelets: 222 10*3/uL (ref 150–400)
RBC: 4.09 MIL/uL — ABNORMAL LOW (ref 4.22–5.81)
RDW: 12.2 % (ref 11.5–15.5)
WBC: 9.7 10*3/uL (ref 4.0–10.5)
nRBC: 0 % (ref 0.0–0.2)

## 2020-07-26 NOTE — TOC Transition Note (Signed)
Transition of Care [facility]) - CM/SW Discharge Note   Patient Details  Name: Miguel [name] MRN: [id number] Date of Birth: 03-16-1951  Transition of Care [facility]) CM/SW Contact:  Pollie Friar, RN Phone Number: 07/26/2020, 9:57 AM   Clinical Narrative:    CM met with the patient and his wife. The DME for home has not been delivered and wife has not received a call. CM called [employer] and they had it on the schedule for 07/28/2020. CM asked that walker be delivered to the room and the other DME be delivered to the home prior to 5/2 if possible. Pt and wife feel he will be ok without the other DME until the 2nd if need be.  Pt has supervision at home and transport to home.   Final next level of care: Home/Self Care Barriers to Discharge: No Barriers Identified   Patient Goals and CMS Choice Patient states their goals for this hospitalization and ongoing recovery are:: to return to home CMS Medicare.gov Compare Post Acute Care list provided to:: Patient    Discharge Placement                       Discharge Plan and Services   Discharge Planning Services: CM Consult            DME Arranged: 3-N-1,Walker rolling,Wheelchair manual DME Agency: [employer] Date DME Agency Contacted: 07/25/20 Time DME Agency Contacted: [phone] Representative spoke with at DME Agency: [place] Arranged: NA          Social Determinants of Health ([place]) Interventions     Readmission Risk Interventions No flowsheet data found.

## 2020-07-26 NOTE — Progress Notes (Signed)
Patient ID: Miguel [name], male   DOB: [birth year], 70 y.o.   MRN: [id number]  Patient is improved after knee drained.  Was able to ambulate last night.  Abdomen soft and non-tender.   Wound - packing is completely dry.  I removed the packing.  The incision is very thin and superficial, so he will treat this with antibiotic ointment covered with dry gauze.  Discharge home.  Imogene Burn. Georgette Dover, MD, [facility] Surgery  General/ Trauma Surgery   07/26/2020 9:06 AM

## 2020-08-11 DIAGNOSIS — R972 Elevated prostate specific antigen [PSA]: Secondary | ICD-10-CM | POA: Diagnosis not present

## 2020-08-11 DIAGNOSIS — N400 Enlarged prostate without lower urinary tract symptoms: Secondary | ICD-10-CM | POA: Diagnosis not present

## 2020-08-11 DIAGNOSIS — N210 Calculus in bladder: Secondary | ICD-10-CM | POA: Diagnosis not present

## 2020-09-23 ENCOUNTER — Other Ambulatory Visit: Payer: Self-pay | Admitting: Surgery

## 2020-09-23 DIAGNOSIS — K432 Incisional hernia without obstruction or gangrene: Secondary | ICD-10-CM | POA: Diagnosis not present

## 2020-10-07 NOTE — Pre-Procedure Instructions (Signed)
Surgical Instructions    Your procedure is scheduled on Thursday July 21st.  Report to [facility] Main Entrance "A" at 08:30 A.M., then check in with the Admitting office.  Call this number if you have problems the morning of surgery:  [phone]   If you have any questions prior to your surgery date call [phone]: Open Monday-Friday 8am-4pm    Remember:  Do not eat after midnight the night before your surgery  You may drink clear liquids until 07:30 A.M. the morning of your surgery.   Clear liquids allowed are: Water, Non-Citrus Juices (without pulp), Carbonated Beverages, Clear Tea, Black Coffee Only, and Gatorade  Patient Instructions  The night before surgery:  No food after midnight. ONLY clear liquids after midnight  The day of surgery (if you do NOT have diabetes):  Drink ONE (1) Pre-Surgery Clear Ensure by 07:30 A.M. the morning of surgery. Drink in one sitting. Do not sip.  This drink was given to you during your hospital  pre-op appointment visit.  Nothing else to drink after completing the  Pre-Surgery Clear Ensure.         If you have questions, please contact your surgeon's office.     Take these medicines the morning of surgery with A SIP OF WATER  famotidine (PEPCID)   pantoprazole (PROTONIX)  tamsulosin (FLOMAX)  acetaminophen (TYLENOL)- If needed LORazepam (ATIVAN)- If needed methocarbamol (ROBAXIN)- If needed ondansetron (ZOFRAN-ODT)- If needed Oxycodone HCl- If needed tiZANidine (ZANAFLEX)- If needed traMADol (ULTRAM)- If needed     As of today, STOP taking any Aspirin (unless otherwise instructed by your surgeon) Aleve, Naproxen, Ibuprofen, Motrin, Advil, Goody's, BC's, all herbal medications, fish oil, and all vitamins.                     Do NOT Smoke (Tobacco/Vaping) or drink Alcohol 24 hours prior to your procedure.  If you use a CPAP at night, you may bring all equipment for your overnight stay.   Contacts, glasses, piercing's,  hearing aid's, dentures or partials may not be worn into surgery, please bring cases for these belongings.    For patients admitted to the hospital, discharge time will be determined by your treatment team.   Patients discharged the day of surgery will not be allowed to drive home, and someone needs to stay with them for 24 hours.  ONLY 1 SUPPORT PERSON MAY BE PRESENT WHILE YOU ARE IN SURGERY. IF YOU ARE TO BE ADMITTED ONCE YOU ARE IN YOUR ROOM YOU WILL BE ALLOWED TWO (2) VISITORS.  Minor children may have two parents present. Special consideration for safety and communication needs will be reviewed on a case by case basis.   Special instructions:   [place]- Preparing For Surgery  Before surgery, you can play an important role. Because skin is not sterile, your skin needs to be as free of germs as possible. You can reduce the number of germs on your skin by washing with CHG (chlorahexidine gluconate) Soap before surgery.  CHG is an antiseptic cleaner which kills germs and bonds with the skin to continue killing germs even after washing.    Oral Hygiene is also important to reduce your risk of infection.  Remember - BRUSH YOUR TEETH THE MORNING OF SURGERY WITH YOUR REGULAR TOOTHPASTE  Please do not use if you have an allergy to CHG or antibacterial soaps. If your skin becomes reddened/irritated stop using the CHG.  Do not shave (including legs and underarms) for  at least 48 hours prior to first CHG shower. It is OK to shave your face.  Please follow these instructions carefully.   Shower the NIGHT BEFORE SURGERY and the MORNING OF SURGERY  If you chose to wash your hair, wash your hair first as usual with your normal shampoo.  After you shampoo, rinse your hair and body thoroughly to remove the shampoo.  Use CHG Soap as you would any other liquid soap. You can apply CHG directly to the skin and wash gently with a scrungie or a clean washcloth.   Apply the CHG Soap to your body ONLY  FROM THE NECK DOWN.  Do not use on open wounds or open sores. Avoid contact with your eyes, ears, mouth and genitals (private parts). Wash Face and genitals (private parts)  with your normal soap.   Wash thoroughly, paying special attention to the area where your surgery will be performed.  Thoroughly rinse your body with warm water from the neck down.  DO NOT shower/wash with your normal soap after using and rinsing off the CHG Soap.  Pat yourself dry with a CLEAN TOWEL.  Wear CLEAN PAJAMAS to bed the night before surgery  Place CLEAN SHEETS on your bed the night before your surgery  DO NOT SLEEP WITH PETS.   Day of Surgery: Shower with CHG soap. Do not wear jewelry, make up, nail polish, gel polish, artificial nails, or any other type of covering on natural nails including finger and toenails. If patients have artificial nails, gel coating, etc. that need to be removed by a nail salon please have this removed prior to surgery. Surgery may need to be canceled/delayed if the surgeon/ anesthesia feels like the patient is unable to be adequately monitored. Do not wear lotions, powders, perfumes/colognes, or deodorant. Do not shave 48 hours prior to surgery.  Men may shave face and neck. Do not bring valuables to the hospital. [facility] is not responsible for any belongings or valuables. Wear Clean/Comfortable clothing the morning of surgery Remember to brush your teeth WITH YOUR REGULAR TOOTHPASTE.   Please read over the following fact sheets that you were given.

## 2020-10-08 ENCOUNTER — Encounter (HOSPITAL_COMMUNITY): Payer: Self-pay

## 2020-10-08 ENCOUNTER — Other Ambulatory Visit: Payer: Self-pay

## 2020-10-08 ENCOUNTER — Encounter (HOSPITAL_COMMUNITY)
Admission: RE | Admit: 2020-10-08 | Discharge: 2020-10-08 | Disposition: A | Discharge status: Home / Self Care | Payer: Medicare Other | Source: Ambulatory Visit | Attending: Surgery | Admitting: Surgery

## 2020-10-08 DIAGNOSIS — Z1211 Encounter for screening for malignant neoplasm of colon: Secondary | ICD-10-CM | POA: Diagnosis not present

## 2020-10-08 DIAGNOSIS — Z01812 Encounter for preprocedural laboratory examination: Secondary | ICD-10-CM | POA: Insufficient documentation

## 2020-10-08 DIAGNOSIS — K219 Gastro-esophageal reflux disease without esophagitis: Secondary | ICD-10-CM | POA: Diagnosis not present

## 2020-10-08 DIAGNOSIS — K439 Ventral hernia without obstruction or gangrene: Secondary | ICD-10-CM | POA: Diagnosis not present

## 2020-10-08 LAB — BASIC METABOLIC PANEL
Anion gap: 7 (ref 5–15)
BUN: 8 mg/dL (ref 8–23)
CO2: 29 mmol/L (ref 22–32)
Calcium: 8.8 mg/dL — ABNORMAL LOW (ref 8.9–10.3)
Chloride: 102 mmol/L (ref 98–111)
Creatinine, Ser: 1.06 mg/dL (ref 0.61–1.24)
GFR, Estimated: >60 mL/min (ref >60)
Glucose, Bld: 114 mg/dL — ABNORMAL HIGH (ref 70–99)
Potassium: 4.1 mmol/L (ref 3.5–5.1)
Sodium: 138 mmol/L (ref 135–145)

## 2020-10-08 LAB — CBC
HCT: 40.9 % (ref 39.0–52.0)
Hemoglobin: 13.7 g/dL (ref 13.0–17.0)
MCH: 32.5 pg (ref 26.0–34.0)
MCHC: 33.5 g/dL (ref 30.0–36.0)
MCV: 97.1 fL (ref 80.0–100.0)
Platelets: 214 10*3/uL (ref 150–400)
RBC: 4.21 MIL/uL — ABNORMAL LOW (ref 4.22–5.81)
RDW: 12.8 % (ref 11.5–15.5)
WBC: 5.4 10*3/uL (ref 4.0–10.5)
nRBC: 0 % (ref 0.0–0.2)

## 2020-10-08 NOTE — Progress Notes (Addendum)
PCP -  Dr. Allison Quarry Cardiologist - denies  PPM/ICD - n/a Device Orders - n/a Rep Notified - n/a  Chest x-ray - `n/a EKG - 02/06/20 Stress Test - denies ECHO - denies Cardiac Cath - denies  Sleep Study - denies CPAP - n/a  Fasting Blood Sugar - n/a Checks Blood Sugar _ times a day- n/a  Blood Thinner Instructions: n/a Aspirin Instructions: n/a  ERAS Protcol - Clear liquids until 07:30 am day of surgery PRE-SURGERY Ensure or G2- Ensure  COVID TEST- Scheduled for 10/14/20. Patient is aware of date, time, and location.   Anesthesia review: Yes. BP today 168/96 and recheck was 154/96. Patient has a diagnosis of hypertension but is not on any meds. Patient denies any complaints and states BP is probably elevated due to 5/10 pain on left side of abdomen and from walking in to his PAT appointment from the parking deck. A. Zelenak. PA made aware.     Patient denies shortness of breath, fever, cough and chest pain at PAT appointment   All instructions explained to the patient, with a verbal understanding of the material. Patient agrees to go over the instructions while at home for a better understanding. Patient also instructed to self quarantine after being tested for COVID-19. The opportunity to ask questions was provided.

## 2020-10-09 NOTE — Anesthesia Preprocedure Evaluation (Addendum)
Anesthesia Evaluation  Patient identified by MRN, date of birth, ID band Patient awake    Reviewed: Allergy & Precautions, H&P , NPO status , Patient's Chart, lab work & pertinent test results  Airway Mallampati: II  TM Distance: >3 FB Neck ROM: Full    Dental no notable dental hx. (+) Teeth Intact, Dental Advisory Given   Pulmonary neg pulmonary ROS,    Pulmonary exam normal breath sounds clear to auscultation       Cardiovascular hypertension,  Rhythm:Regular Rate:Normal     Neuro/Psych Anxiety negative neurological ROS     GI/Hepatic Neg liver ROS, hiatal hernia, GERD  Medicated,  Endo/Other  negative endocrine ROS  Renal/GU negative Renal ROS  negative genitourinary   Musculoskeletal   Abdominal   Peds  Hematology  (+) Blood dyscrasia, anemia ,   Anesthesia Other Findings   Reproductive/Obstetrics negative OB ROS                           Anesthesia Physical Anesthesia Plan  ASA: 2  Anesthesia Plan: General   Post-op Pain Management:    Induction: Intravenous  PONV Risk Score and Plan: 3 and Ondansetron, Dexamethasone and Midazolam  Airway Management Planned: Oral ETT  Additional Equipment:   Intra-op Plan:   Post-operative Plan: Extubation in OR  Informed Consent: I have reviewed the patients History and Physical, chart, labs and discussed the procedure including the risks, benefits and alternatives for the proposed anesthesia with the patient or authorized representative who has indicated his/her understanding and acceptance.     Dental advisory given  Plan Discussed with: CRNA  Anesthesia Plan Comments: (PAT note written 10/09/2020 by Myra Gianotti, PA-C. )       Anesthesia Quick Evaluation

## 2020-10-09 NOTE — Progress Notes (Signed)
Anesthesia Chart Review:  Case: [id number] Date/Time: 10/16/20 1015   Procedure: INCISIONAL HERNIA REPAIR WITH MESH   Anesthesia type: General   Pre-op diagnosis: INCISIONAL HERNIA   Location: [place] OR ROOM 04 / [place] [facility]   Surgeons: [name], MD       DISCUSSION:  Patient is a 70 year old male scheduled for the above procedure.   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 S/p cystoscopy, cystolitholapaxy of Hutch bladder diverticular stones using holmium laser on 02/12/20.    BP elevated at PAT 168/96-->154/96. He reported increased left abdominal pain from walking to PAT. He is not on medication for HTN. BP 168/84 prior to April's surgery. He denied chest pain and SOB. 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  Preoperative labs from 10/08/20 acceptable for OR.  Presurgical COVID-19 testing scheduled for 10/14/2020.  He tolerated general anesthesia for recent gastrocutaneous fistula closure.  Anesthesia team evaluation on the day of surgery.     VS: BP (!) 154/96   Pulse 92   Temp 36.9 C (Oral)   Resp 18   Ht 5\' 3"  (1.6 m)   Wt 57 kg   SpO2 99%   BMI 22.25 kg/m  BP Readings from Last 3 Encounters:  10/08/20 (!) 154/96  07/26/20 118/75  02/12/20 (!) 141/78     PROVIDERS: Alroy Dust, L.Marlou Sa, MD is PCP     LABS: Labs reviewed: Acceptable for surgery. (all labs ordered are listed, but only abnormal results are displayed)  Labs Reviewed  BASIC METABOLIC PANEL - Abnormal; Notable for the following components:      Result Value   Glucose, Bld 114 (*)    Calcium 8.8 (*)    All other components within normal limits  CBC - Abnormal; Notable for the following components:   RBC 4.21 (*)    All other components within normal limits    EKG: 02/06/20: Normal sinus rhythm Left anterior fascicular block Abnormal ECG Confirmed by Ida Rogue [phone]) on 02/07/2020 1:55:21 PM - No significant changes when compared to 03/09/18 tracing.    CV: N/A  Past Medical History:  Diagnosis Date   Anemia    Anxiety    BPH (benign prostatic hyperplasia)    Gastritis    GERD (gastroesophageal reflux disease)    "seldom" (07/16/2016)   GI bleed due to NSAIDs 10/27/2015   History of blood transfusion 06/2016   post OR/notes 07/15/2016   History of hiatal hernia    History of kidney stones    surgery to remove stone   Hyperlipidemia    Hypertension    no meds    Melanoma of back ([place])    "mid back"   Sigmoid diverticulitis    with perforation    Past Surgical History:  Procedure Laterality Date   COLON SURGERY     sigmoid   COLOSTOMY TAKEDOWN N/A 06/28/2016   Procedure: COLOSTOMY TAKEDOWN;  Surgeon: [name], MD;  Location: [facility];  Service: General;  Laterality: N/A;  CYSTOSCOPY WITH RETROGRADE PYELOGRAM, URETEROSCOPY AND STENT PLACEMENT Bilateral 02/12/2020   Procedure: CYSTOSCOPY WITH BILATERAL RETROGRADE PYELOGRAM,  AND LITHOPEXY;  Surgeon: Remi Haggard, MD;  [facility];  Service: Urology;  Laterality: Bilateral;  1 HR   ESOPHAGOGASTRODUODENOSCOPY N/A 10/27/2015   Procedure: ESOPHAGOGASTRODUODENOSCOPY (EGD);  Surgeon: Clarene Essex, MD;  Location: [facility] ENDOSCOPY;  Service: Endoscopy;  Laterality: N/A;   ESOPHAGOGASTRODUODENOSCOPY N/A 10/29/2015   Procedure:  ESOPHAGOGASTRODUODENOSCOPY (EGD);  Surgeon: Ronald Lobo, MD;  Location: [facility] END[facility]COPY;  Service: Endoscopy;  Laterality: N/A;   ESOPHAGOGASTRODUODENOSCOPY N/A 10/30/2015   Procedure: ESOPHAGOGASTRODUODENOSCOPY (EGD);  Surgeon: Clarene Essex, MD;  Location: [facility] ENDOSCOPY;  Service: Endoscopy;  Laterality: N/A;   g tube discontinued  04/2020   per patient   [place]  11/21/2015   REDUCTION OF HIATAL HERNIA , REPAIR HIATAL HERNIA, RESECTION SMALL BOWEL WITH ANASTOMOSIS, PLACEMENT GASTROSTOMY TUBE, PLACEMENT DUODENOSTOMY TUBE (N/A)   HEMORRHOID BANDING  X 2   HERNIA REPAIR     HIATAL HERNIA REPAIR N/A 11/21/2015   Procedure: REDUCTION OF HIATAL HERNIA , REPAIR HIATAL HERNIA, RESECTION SMALL BOWEL WITH ANASTOMOSIS, PLACEMENT GASTROSTOMY TUBE, PLACEMENT DUODENOSTOMY TUBE;  Surgeon: Mickeal Skinner, MD;  Location: [place];  Service: General;  Laterality: N/A;   HOLMIUM LASER APPLICATION Right 41/96/2229   Procedure: HOLMIUM LASER APPLICATION;  Surgeon: Remi Haggard, MD;  [facility];  Service: Urology;  Laterality: Right;   INCISIONAL HERNIA REPAIR  06/28/2016   open/notes 07/15/2016   INCISIONAL HERNIA REPAIR  03/17/2018   WITH MESH   INCISIONAL HERNIA REPAIR N/A 03/17/2018   Procedure: INCISIONAL HERNIA REPAIR WITH MESH;  Surgeon: [name], MD;  Location: [place];  Service: General;  Laterality: N/A;   INGUINAL HERNIA REPAIR Bilateral 09/28/2018   INGUINAL HERNIA REPAIR Bilateral 09/28/2018   Procedure: BILATERAL OPEN INGUINAL HERNIA REPAIR WITH MESH;  Surgeon: [name], MD;  Location: [place];  Service: General;  Laterality: Bilateral;  GENERAL AND TAP BLOCK   INSERTION OF MESH N/A 03/17/2018   Procedure: INSERTION OF MESH;  Surgeon: [name], MD;  Location: [place];  Service: General;  Laterality: N/A;   INSERTION OF MESH Bilateral 09/28/2018   Procedure: Insertion Of Mesh;  Surgeon: [name], MD;  Location: [place];  Service: General;   Laterality: Bilateral;   IR CM INJ ANY COLONIC TUBE W/FLUORO  02/04/2017   IR GUIDED DRAIN W CATHETER PLACEMENT  07/06/2016   /NOTES 07/15/2016   IR PATIENT EVAL TECH 0-60 MINS  06/28/2019   IR RADIOLOGIST EVAL & MGMT  07/27/2016   IR RADIOLOGIST EVAL & MGMT  08/17/2016   IR RADIOLOGIST EVAL & MGMT  08/26/2016   IR REPLACE G-TUBE SIMPLE WO FLUORO  07/28/2017   IR REPLACE G-TUBE SIMPLE WO FLUORO  01/31/2018   IR REPLACE G-TUBE SIMPLE WO FLUORO  10/16/2018   IR REPLACE G-TUBE SIMPLE WO FLUORO  03/05/2019   IR REPLACE G-TUBE SIMPLE WO FLUORO  08/22/2019   IR REPLACE G-TUBE SIMPLE WO FLUORO  01/08/2020   IR [place] GASTRO/COLONIC TUBE PERCUT W/FLUORO  08/04/2016   IR [place] GASTRO/COLONIC TUBE PERCUT W/FLUORO  01/14/2017   IR US GUIDE BX ASP/DRAIN  08/04/2016   KNEE CARTILAGE SURGERY Right 1971   "opened me up"   LAPAROTOMY N/A 07/05/2015   Procedure: PARTIAL SIGMOID COLECTOMY AND COLOSTOMY;  Surgeon: [name], MD;  Location: [place];  Service: General;  Laterality: N/A;   MELANOMA EXCISION  2001   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  10/30/2015   Procedure: REMOVAL OF DISTAL STOMACH;  Surgeon: Judeth Horn, MD;  Location: [place];  Service: General;;   REPAIR OF PERFORATED ULCER N/A 10/30/2015   Procedure: REPAIR OF BLEEDING  ULCER;  Surgeon: Judeth Horn, MD;  Location: [place];  Service: General;  Laterality: N/A;   TUMOR EXCISION  2009   "back; fatty tumor"    MEDICATIONS:  acetaminophen (TYLENOL) 500 MG tablet   diclofenac sodium (VOLTAREN) 1 % GEL   famotidine (PEPCID) 20 MG tablet   LORazepam (ATIVAN) 1 MG tablet   methocarbamol (ROBAXIN) 500 MG tablet   Multiple Vitamin (MULTIVITAMIN WITH MINERALS) TABS tablet   ondansetron (ZOFRAN-ODT) 4 MG disintegrating tablet   Oxycodone HCl 10 MG TABS   pantoprazole (PROTONIX) 40 MG tablet   polyethylene glycol (MIRALAX / GLYCOLAX) 17 g packet   Probiotic Product (PROBIOTIC DAILY PO)   tamsulosin (FLOMAX) 0.4 MG CAPS capsule   tiZANidine  (ZANAFLEX) 2 MG tablet   traMADol (ULTRAM) 50 MG tablet   vitamin B-12 (CYANOCOBALAMIN) 1000 MCG tablet   zolpidem (AMBIEN) 10 MG tablet   No current facility-administered medications for this encounter.    0.9 % NaCl with KCl 20 mEq/ L  infusion   morphine 4 MG/ML injection 1-4 mg   oxyCODONE (Oxy IR/ROXICODONE) immediate release tablet 5-10 mg    Myra Gianotti, PA-C Surgical Short Stay/Anesthesiology [facility] Phone [phone] [facility] Phone [phone] 10/09/2020 6:50 PM

## 2020-10-14 ENCOUNTER — Other Ambulatory Visit (HOSPITAL_COMMUNITY)
Admission: RE | Admit: 2020-10-14 | Discharge: 2020-10-14 | Disposition: A | Discharge status: Home / Self Care | Payer: Medicare Other | Source: Ambulatory Visit | Attending: Surgery | Admitting: Surgery

## 2020-10-14 DIAGNOSIS — Z20822 Contact with and (suspected) exposure to covid-19: Secondary | ICD-10-CM | POA: Insufficient documentation

## 2020-10-14 DIAGNOSIS — Z01812 Encounter for preprocedural laboratory examination: Secondary | ICD-10-CM | POA: Insufficient documentation

## 2020-10-14 LAB — SARS CORONAVIRUS 2 (TAT 6-24 HRS): SARS Coronavirus 2: NEGATIVE

## 2020-10-15 DIAGNOSIS — Z1211 Encounter for screening for malignant neoplasm of colon: Secondary | ICD-10-CM | POA: Diagnosis not present

## 2020-10-15 DIAGNOSIS — Z1212 Encounter for screening for malignant neoplasm of rectum: Secondary | ICD-10-CM | POA: Diagnosis not present

## 2020-10-15 SURGERY — Surgical Case
Anesthesia: *Unknown

## 2020-10-15 NOTE — H&P (Signed)
Miguel [name] is an 70 y.o. male.   Chief Complaint: incisional hernia HPI: This is a pleasant 70 year old gentleman who has undergone numerous abdominal surgical procedures.  He has had an incisional hernia for a while and is now finally a candidate for repair with mesh.  He is currently doing well and is maintaining his weight.  Other than his chronic abdominal discomfort, he has no other complaints  Past Medical History:  Diagnosis Date   Anemia    Anxiety    BPH (benign prostatic hyperplasia)    Gastritis    GERD (gastroesophageal reflux disease)    "seldom" (07/16/2016)   GI bleed due to NSAIDs 10/27/2015   History of blood transfusion 06/2016   post OR/notes 07/15/2016   History of hiatal hernia    History of kidney stones    surgery to remove stone   Hyperlipidemia    Hypertension    no meds    Melanoma of back ([place])    "mid back"   Sigmoid diverticulitis    with perforation    Past Surgical History:  Procedure Laterality Date   COLON SURGERY     sigmoid   COLOSTOMY TAKEDOWN N/A 06/28/2016   Procedure: COLOSTOMY TAKEDOWN;  Surgeon: [name], MD;  Location: [place];  Service: General;  Laterality: N/A;   CYSTOSCOPY WITH RETROGRADE PYELOGRAM, URETEROSCOPY AND STENT PLACEMENT Bilateral 02/12/2020   Procedure: CYSTOSCOPY WITH BILATERAL RETROGRADE PYELOGRAM,  AND LITHOPEXY;  Surgeon: Remi Haggard, MD;  [facility];  Service: Urology;  Laterality: Bilateral;  1 HR   ESOPHAGOGASTRODUODENOSCOPY N/A 10/27/2015   Procedure: ESOPHAGOGASTRODUODENOSCOPY (EGD);  Surgeon: Clarene Essex, MD;  Location: [facility] ENDOSCOPY;  Service: Endoscopy;  Laterality: N/A;   ESOPHAGOGASTRODUODENOSCOPY N/A 10/29/2015   Procedure: ESOPHAGOGASTRODUODENOSCOPY (EGD);  Surgeon: Ronald Lobo, MD;  Location: [facility] ENDOSCOPY;  Service: Endoscopy;  Laterality: N/A;   ESOPHAGOGASTRODUODENOSCOPY N/A 10/30/2015   Procedure: ESOPHAGOGASTRODUODENOSCOPY (EGD);  Surgeon: Clarene Essex, MD;  Location: [facility] ENDOSCOPY;   Service: Endoscopy;  Laterality: N/A;   g tube discontinued  04/2020   per patient   [place]  11/21/2015   REDUCTION OF HIATAL HERNIA , REPAIR HIATAL HERNIA, RESECTION SMALL BOWEL WITH ANASTOMOSIS, PLACEMENT GASTROSTOMY TUBE, PLACEMENT DUODENOSTOMY TUBE (N/A)   HEMORRHOID BANDING  X 2   HERNIA REPAIR     HIATAL HERNIA REPAIR N/A 11/21/2015   Procedure: REDUCTION OF HIATAL HERNIA , REPAIR HIATAL HERNIA, RESECTION SMALL BOWEL WITH ANASTOMOSIS, PLACEMENT GASTROSTOMY TUBE, PLACEMENT DUODENOSTOMY TUBE;  Surgeon: Mickeal Skinner, MD;  Location: [place];  Service: General;  Laterality: N/A;   HOLMIUM LASER APPLICATION Right 15/72/6203   Procedure: HOLMIUM LASER APPLICATION;  Surgeon: Remi Haggard, MD;  [facility];  Service: Urology;  Laterality: Right;   INCISIONAL HERNIA REPAIR  06/28/2016   open/notes 07/15/2016   INCISIONAL HERNIA REPAIR  03/17/2018   WITH MESH   INCISIONAL HERNIA REPAIR N/A 03/17/2018   Procedure: INCISIONAL HERNIA REPAIR WITH MESH;  Surgeon: [name], MD;  Location: [place];  Service: General;  Laterality: N/A;   INGUINAL HERNIA REPAIR Bilateral 09/28/2018   INGUINAL HERNIA REPAIR Bilateral 09/28/2018   Procedure: BILATERAL OPEN INGUINAL HERNIA REPAIR WITH MESH;  Surgeon: [name], MD;  Location: [place];  Service: General;  Laterality: Bilateral;  GENERAL AND TAP BLOCK   INSERTION OF MESH N/A 03/17/2018   Procedure: INSERTION OF MESH;  Surgeon: [name], MD;  Location: [place];  Service: General;  Laterality: N/A;   INSERTION  OF MESH Bilateral 09/28/2018   Procedure: Insertion Of Mesh;  Surgeon: [name], MD;  Location: [place];  Service: General;  Laterality: Bilateral;   IR CM INJ ANY COLONIC TUBE W/FLUORO  02/04/2017   IR GUIDED DRAIN W CATHETER PLACEMENT  07/06/2016   /NOTES 07/15/2016   IR PATIENT EVAL TECH 0-60 MINS  06/28/2019   IR RADIOLOGIST EVAL & MGMT  07/27/2016   IR RADIOLOGIST EVAL & MGMT  08/17/2016   IR  RADIOLOGIST EVAL & MGMT  08/26/2016   IR REPLACE G-TUBE SIMPLE WO FLUORO  07/28/2017   IR REPLACE G-TUBE SIMPLE WO FLUORO  01/31/2018   IR REPLACE G-TUBE SIMPLE WO FLUORO  10/16/2018   IR REPLACE G-TUBE SIMPLE WO FLUORO  03/05/2019   IR REPLACE G-TUBE SIMPLE WO FLUORO  08/22/2019   IR REPLACE G-TUBE SIMPLE WO FLUORO  01/08/2020   IR [place] GASTRO/COLONIC TUBE PERCUT W/FLUORO  08/04/2016   IR [place] GASTRO/COLONIC TUBE PERCUT W/FLUORO  01/14/2017   IR US GUIDE BX ASP/DRAIN  08/04/2016   KNEE CARTILAGE SURGERY Right 1971   "opened me up"   LAPAROTOMY N/A 07/05/2015   Procedure: PARTIAL SIGMOID COLECTOMY AND COLOSTOMY;  Surgeon: [name], MD;  Location: [place] [facility];  Service: General;  Laterality: N/A;   MELANOMA EXCISION  2001   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  10/30/2015   Procedure: REMOVAL OF DISTAL STOMACH;  Surgeon: Judeth Horn, MD;  Location: [place];  Service: General;;   REPAIR OF PERFORATED ULCER N/A 10/30/2015   Procedure: REPAIR OF BLEEDING  ULCER;  Surgeon: Judeth Horn, MD;  Location: [place];  Service: General;  Laterality: N/A;   TUMOR EXCISION  2009   "back; fatty tumor"    Family History  Problem Relation Age of Onset   Stroke Mother    Stroke Brother    Heart disease Brother    Social History:  reports that he has never smoked. He has quit using smokeless tobacco.  His smokeless tobacco use included snuff. He reports that he does not drink alcohol and does not use drugs.  Allergies: No Known Allergies  No medications prior to admission.    Results for orders placed or performed during the hospital encounter of 10/14/20 (from the past 48 hour(s))  SARS CORONAVIRUS 2 (TAT 6-24 HRS) Nasopharyngeal Nasopharyngeal Swab     Status: None   Collection Time: 10/14/20 10:10 AM   Specimen: Nasopharyngeal Swab  Result Value Ref Range   SARS Coronavirus 2 NEGATIVE NEGATIVE    Comment: (NOTE) SARS-CoV-2 target nucleic acids are NOT DETECTED.  The SARS-CoV-2 RNA is  generally detectable in upper and lower respiratory specimens during the acute phase of infection. Negative results do not preclude SARS-CoV-2 infection, do not rule out co-infections with other pathogens, and should not be used as the sole basis for treatment or other patient management decisions. Negative results must be combined with clinical observations, patient history, and epidemiological information. The expected result is Negative.  Fact Sheet for Patients: SugarRoll.be  Fact Sheet for Healthcare Providers: https://www.woods-mathews.com/  This test is not yet approved or cleared by the [place] FDA and  has been authorized for detection and/or diagnosis of SARS-CoV-2 by FDA under an Emergency Use Authorization (EUA). This EUA will remain  in effect (meaning this test can be used) for the duration of the COVID-19 declaration under Se ction 564(b)(1) of the Act, 21 U.S.C. section 360bbb-3(b)(1), unless the authorization is terminated or revoked sooner.  Performed at  [facility], [place] [address] [place], [place] [address]    No results found.  Review of Systems  Constitutional:  Negative for chills and fever.  Respiratory:  Negative for cough and shortness of breath.   Cardiovascular:  Negative for chest pain.  All other systems reviewed and are negative.  There were no vitals taken for this visit. Physical Exam Constitutional:      General: He is not in acute distress.    Appearance: Normal appearance. He is not ill-appearing.  HENT:     Head: Normocephalic and atraumatic.     Right Ear: External ear normal.     Left Ear: External ear normal.     Nose: Nose normal.     Mouth/Throat:     Mouth: Mucous membranes are moist.  Eyes:     General: No scleral icterus.    Pupils: Pupils are equal, round, and reactive to light.  Cardiovascular:     Rate and Rhythm: Normal rate and regular rhythm.  Pulmonary:      Effort: Pulmonary effort is normal.     Breath sounds: Normal breath sounds.  Abdominal:     General: There is no distension.     Tenderness: There is no abdominal tenderness.     Comments: Multiple fascial defects with incisional hernias which are reducible and non-tender. Lax abdominal wall  Musculoskeletal:     Cervical back: Normal range of motion.  Skin:    General: Skin is warm and dry.  Neurological:     General: No focal deficit present.     Mental Status: He is alert.  Psychiatric:        Behavior: Behavior normal.        Judgment: Judgment normal.     Assessment/Plan Incisional hernia  Will plan on an extensive open incisional hernia repair with mesh given his multiple fascial defects and expected extensive scar tissue.  We discussed the risks at length.  These include but are not limited to bleeding, infection, injury to surrounding structures, the use of mesh, the need for further procedures, hernia recurrence, cardiopulmonary issues, etc.  He is eager to proceed with surgery  [name], MD 10/15/2020, 5:05 PM

## 2020-10-16 ENCOUNTER — Ambulatory Visit (HOSPITAL_COMMUNITY): Payer: Medicare Other | Admitting: Anesthesiology

## 2020-10-16 ENCOUNTER — Encounter (HOSPITAL_COMMUNITY): Payer: Self-pay | Admitting: Surgery

## 2020-10-16 ENCOUNTER — Ambulatory Visit (HOSPITAL_COMMUNITY): Payer: Medicare Other | Admitting: Vascular Surgery

## 2020-10-16 ENCOUNTER — Inpatient Hospital Stay (HOSPITAL_COMMUNITY)
Admission: RE | Admit: 2020-10-16 | Discharge: 2020-10-20 | DRG: 354 | Disposition: A | Discharge status: Home / Self Care | Payer: Medicare Other | Source: Ambulatory Visit | Attending: Surgery | Admitting: Surgery

## 2020-10-16 ENCOUNTER — Encounter (HOSPITAL_COMMUNITY)
Admission: RE | Disposition: A | Discharge status: Home / Self Care | Payer: Self-pay | Source: Ambulatory Visit | Attending: Surgery

## 2020-10-16 ENCOUNTER — Other Ambulatory Visit: Payer: Self-pay

## 2020-10-16 DIAGNOSIS — Z823 Family history of stroke: Secondary | ICD-10-CM

## 2020-10-16 DIAGNOSIS — Z85820 Personal history of malignant melanoma of skin: Secondary | ICD-10-CM

## 2020-10-16 DIAGNOSIS — Z8249 Family history of ischemic heart disease and other diseases of the circulatory system: Secondary | ICD-10-CM | POA: Diagnosis not present

## 2020-10-16 DIAGNOSIS — A419 Sepsis, unspecified organism: Secondary | ICD-10-CM | POA: Diagnosis not present

## 2020-10-16 DIAGNOSIS — K432 Incisional hernia without obstruction or gangrene: Principal | ICD-10-CM | POA: Diagnosis present

## 2020-10-16 DIAGNOSIS — Z20822 Contact with and (suspected) exposure to covid-19: Secondary | ICD-10-CM | POA: Diagnosis present

## 2020-10-16 DIAGNOSIS — D62 Acute posthemorrhagic anemia: Secondary | ICD-10-CM | POA: Diagnosis not present

## 2020-10-16 DIAGNOSIS — I959 Hypotension, unspecified: Secondary | ICD-10-CM | POA: Diagnosis not present

## 2020-10-16 DIAGNOSIS — Z87442 Personal history of urinary calculi: Secondary | ICD-10-CM | POA: Diagnosis not present

## 2020-10-16 DIAGNOSIS — E860 Dehydration: Secondary | ICD-10-CM | POA: Diagnosis present

## 2020-10-16 DIAGNOSIS — E43 Unspecified severe protein-calorie malnutrition: Secondary | ICD-10-CM | POA: Diagnosis not present

## 2020-10-16 HISTORY — PX: INCISIONAL HERNIA REPAIR: SHX193

## 2020-10-16 LAB — POCT I-STAT EG7
Acid-Base Excess: 4 mmol/L — ABNORMAL HIGH (ref 0.0–2.0)
Bicarbonate: 32.4 mmol/L — ABNORMAL HIGH (ref 20.0–28.0)
Calcium, Ion: 1.24 mmol/L (ref 1.15–1.40)
HCT: 34 % — ABNORMAL LOW (ref 39.0–52.0)
Hemoglobin: 11.6 g/dL — ABNORMAL LOW (ref 13.0–17.0)
O2 Saturation: 33 %
Patient temperature: 37
Potassium: 3.9 mmol/L (ref 3.5–5.1)
Sodium: 139 mmol/L (ref 135–145)
TCO2: 34 mmol/L — ABNORMAL HIGH (ref 22–32)
pCO2, Ven: 66.5 mmHg — ABNORMAL HIGH (ref 44.0–60.0)
pH, Ven: 7.296 (ref 7.250–7.430)
pO2, Ven: 23 mmHg — CL (ref 32.0–45.0)

## 2020-10-16 LAB — CBC
HCT: 33.3 % — ABNORMAL LOW (ref 39.0–52.0)
Hemoglobin: 11.2 g/dL — ABNORMAL LOW (ref 13.0–17.0)
MCH: 32.4 pg (ref 26.0–34.0)
MCHC: 33.6 g/dL (ref 30.0–36.0)
MCV: 96.2 fL (ref 80.0–100.0)
Platelets: 197 10*3/uL (ref 150–400)
RBC: 3.46 MIL/uL — ABNORMAL LOW (ref 4.22–5.81)
RDW: 13 % (ref 11.5–15.5)
WBC: 16 10*3/uL — ABNORMAL HIGH (ref 4.0–10.5)
nRBC: 0 % (ref 0.0–0.2)

## 2020-10-16 SURGERY — REPAIR, HERNIA, INCISIONAL
Anesthesia: General | Site: Abdomen

## 2020-10-16 MED ORDER — DIPHENHYDRAMINE HCL 12.5 MG/5ML PO ELIX
12.5000 mg | ORAL_SOLUTION | Freq: Four times a day (QID) | ORAL | Status: DC | PRN
  Filled 2020-10-16: qty 10

## 2020-10-16 MED ORDER — ALBUMIN HUMAN 5 % IV SOLN
12.5000 g | Freq: Once | INTRAVENOUS | Status: AC
  Administered 2020-10-16: 12.5 g via INTRAVENOUS

## 2020-10-16 MED ORDER — TRAMADOL HCL 50 MG PO TABS
50.0000 mg | ORAL_TABLET | Freq: Four times a day (QID) | ORAL | Status: DC | PRN
Start: 2020-10-16 — End: 2020-10-20
  Filled 2020-10-16: qty 1

## 2020-10-16 MED ORDER — BUPIVACAINE-EPINEPHRINE (PF) 0.25% -1:200000 IJ SOLN
INTRAMUSCULAR | Status: AC
  Filled 2020-10-16: qty 30

## 2020-10-16 MED ORDER — ONDANSETRON HCL 4 MG/2ML IJ SOLN
INTRAMUSCULAR | Status: AC
  Filled 2020-10-16: qty 2

## 2020-10-16 MED ORDER — CHLORHEXIDINE GLUCONATE CLOTH 2 % EX PADS: 6.0000 | MEDICATED_PAD | Freq: Once | CUTANEOUS | Status: DC

## 2020-10-16 MED ORDER — LABETALOL HCL 5 MG/ML IV SOLN
5.0000 mg | Freq: Once | INTRAVENOUS | Status: AC
  Administered 2020-10-16: 5 mg via INTRAVENOUS

## 2020-10-16 MED ORDER — CHLORHEXIDINE GLUCONATE 0.12 % MT SOLN
15.0000 mL | Freq: Once | OROMUCOSAL | Status: AC
  Administered 2020-10-16: 15 mL via OROMUCOSAL
  Filled 2020-10-16: qty 15

## 2020-10-16 MED ORDER — MIDAZOLAM HCL 5 MG/5ML IJ SOLN
INTRAMUSCULAR | Status: DC | PRN
  Administered 2020-10-16 (×2): 1 mg via INTRAVENOUS

## 2020-10-16 MED ORDER — ACETAMINOPHEN 500 MG PO TABS
1000.0000 mg | ORAL_TABLET | ORAL | Status: AC
Start: 2020-10-16 — End: 2020-10-16
  Administered 2020-10-16: 1000 mg via ORAL
  Filled 2020-10-16: qty 2

## 2020-10-16 MED ORDER — LACTATED RINGERS IV SOLN: INTRAVENOUS | Status: DC

## 2020-10-16 MED ORDER — ONDANSETRON 4 MG PO TBDP: 4.0000 mg | ORAL_TABLET | Freq: Four times a day (QID) | ORAL | Status: DC | PRN

## 2020-10-16 MED ORDER — CEFAZOLIN SODIUM-DEXTROSE 2-4 GM/100ML-% IV SOLN
2.0000 g | INTRAVENOUS | Status: AC
  Administered 2020-10-16: 2 g via INTRAVENOUS
  Filled 2020-10-16: qty 100

## 2020-10-16 MED ORDER — PHENYLEPHRINE 40 MCG/ML (10ML) SYRINGE FOR IV PUSH (FOR BLOOD PRESSURE SUPPORT)
PREFILLED_SYRINGE | INTRAVENOUS | Status: AC
  Filled 2020-10-16: qty 10

## 2020-10-16 MED ORDER — LABETALOL HCL 5 MG/ML IV SOLN
INTRAVENOUS | Status: AC
  Filled 2020-10-16: qty 4

## 2020-10-16 MED ORDER — ONDANSETRON HCL 4 MG/2ML IJ SOLN
4.0000 mg | Freq: Four times a day (QID) | INTRAMUSCULAR | Status: DC | PRN
  Administered 2020-10-17 – 2020-10-19 (×4): 4 mg via INTRAVENOUS
  Filled 2020-10-16 (×5): qty 2

## 2020-10-16 MED ORDER — LIDOCAINE HCL (CARDIAC) PF 100 MG/5ML IV SOSY
PREFILLED_SYRINGE | INTRAVENOUS | Status: DC | PRN
  Administered 2020-10-16: 60 mg via INTRAVENOUS

## 2020-10-16 MED ORDER — DIPHENHYDRAMINE HCL 12.5 MG/5ML PO ELIX
12.5000 mg | ORAL_SOLUTION | Freq: Four times a day (QID) | ORAL | Status: DC | PRN
  Filled 2020-10-16: qty 5

## 2020-10-16 MED ORDER — ALBUMIN HUMAN 5 % IV SOLN
INTRAVENOUS | Status: AC
  Filled 2020-10-16: qty 250

## 2020-10-16 MED ORDER — ENSURE PRE-SURGERY PO LIQD: 296.0000 mL | Freq: Once | ORAL | Status: DC

## 2020-10-16 MED ORDER — FAMOTIDINE 20 MG PO TABS
20.0000 mg | ORAL_TABLET | Freq: Two times a day (BID) | ORAL | Status: DC
  Administered 2020-10-16 – 2020-10-20 (×8): 20 mg via ORAL
  Filled 2020-10-16 (×9): qty 1

## 2020-10-16 MED ORDER — HYDROMORPHONE HCL 1 MG/ML IJ SOLN
0.2500 mg | INTRAMUSCULAR | Status: DC | PRN
  Administered 2020-10-16: 0.5 mg via INTRAVENOUS

## 2020-10-16 MED ORDER — MIDAZOLAM HCL 2 MG/2ML IJ SOLN
INTRAMUSCULAR | Status: AC
  Filled 2020-10-16: qty 2

## 2020-10-16 MED ORDER — FENTANYL CITRATE (PF) 100 MCG/2ML IJ SOLN
INTRAMUSCULAR | Status: DC | PRN
  Administered 2020-10-16 (×5): 50 ug via INTRAVENOUS

## 2020-10-16 MED ORDER — ONDANSETRON HCL 4 MG/2ML IJ SOLN
4.0000 mg | Freq: Four times a day (QID) | INTRAMUSCULAR | Status: DC | PRN
  Administered 2020-10-16 (×2): 4 mg via INTRAVENOUS
  Filled 2020-10-16: qty 2

## 2020-10-16 MED ORDER — OXYCODONE HCL 5 MG PO TABS
5.0000 mg | ORAL_TABLET | ORAL | Status: DC | PRN
  Administered 2020-10-17: 10 mg via ORAL
  Administered 2020-10-17: 5 mg via ORAL
  Administered 2020-10-18: 10 mg via ORAL
  Administered 2020-10-18 (×2): 5 mg via ORAL
  Administered 2020-10-18 – 2020-10-20 (×6): 10 mg via ORAL
  Filled 2020-10-16 (×3): qty 2
  Filled 2020-10-16 (×2): qty 1
  Filled 2020-10-16 (×7): qty 2
  Filled 2020-10-16: qty 1
  Filled 2020-10-16: qty 2

## 2020-10-16 MED ORDER — ROCURONIUM BROMIDE 10 MG/ML (PF) SYRINGE
PREFILLED_SYRINGE | INTRAVENOUS | Status: AC
  Filled 2020-10-16: qty 10

## 2020-10-16 MED ORDER — SODIUM CHLORIDE 0.9% FLUSH: 9.0000 mL | INTRAVENOUS | Status: DC | PRN

## 2020-10-16 MED ORDER — ACETAMINOPHEN 500 MG PO TABS
1000.0000 mg | ORAL_TABLET | Freq: Four times a day (QID) | ORAL | Status: DC
  Administered 2020-10-16 – 2020-10-20 (×14): 1000 mg via ORAL
  Filled 2020-10-16 (×15): qty 2

## 2020-10-16 MED ORDER — DIPHENHYDRAMINE HCL 50 MG/ML IJ SOLN
12.5000 mg | Freq: Four times a day (QID) | INTRAMUSCULAR | Status: DC | PRN
  Filled 2020-10-16: qty 0.25

## 2020-10-16 MED ORDER — ESMOLOL HCL 100 MG/10ML IV SOLN
INTRAVENOUS | Status: DC | PRN
  Administered 2020-10-16: 10 mg via INTRAVENOUS

## 2020-10-16 MED ORDER — TAMSULOSIN HCL 0.4 MG PO CAPS
0.4000 mg | ORAL_CAPSULE | Freq: Every day | ORAL | Status: DC
  Administered 2020-10-17 – 2020-10-20 (×4): 0.4 mg via ORAL
  Filled 2020-10-16 (×4): qty 1

## 2020-10-16 MED ORDER — DIPHENHYDRAMINE HCL 50 MG/ML IJ SOLN: 12.5000 mg | Freq: Four times a day (QID) | INTRAMUSCULAR | Status: DC | PRN

## 2020-10-16 MED ORDER — ROCURONIUM BROMIDE 100 MG/10ML IV SOLN
INTRAVENOUS | Status: DC | PRN
  Administered 2020-10-16: 50 mg via INTRAVENOUS

## 2020-10-16 MED ORDER — DEXAMETHASONE SODIUM PHOSPHATE 10 MG/ML IJ SOLN
INTRAMUSCULAR | Status: DC | PRN
  Administered 2020-10-16: 4 mg via INTRAVENOUS

## 2020-10-16 MED ORDER — FENTANYL CITRATE (PF) 250 MCG/5ML IJ SOLN
INTRAMUSCULAR | Status: AC
  Filled 2020-10-16: qty 5

## 2020-10-16 MED ORDER — ENOXAPARIN SODIUM 40 MG/0.4ML IJ SOSY
40.0000 mg | PREFILLED_SYRINGE | INTRAMUSCULAR | Status: DC
  Administered 2020-10-17: 40 mg via SUBCUTANEOUS
  Filled 2020-10-16: qty 0.4

## 2020-10-16 MED ORDER — 0.9 % SODIUM CHLORIDE (POUR BTL) OPTIME
TOPICAL | Status: DC | PRN
  Administered 2020-10-16: 1000 mL

## 2020-10-16 MED ORDER — BUPIVACAINE-EPINEPHRINE 0.25% -1:200000 IJ SOLN
INTRAMUSCULAR | Status: DC | PRN
  Administered 2020-10-16: 10 mL

## 2020-10-16 MED ORDER — SUGAMMADEX SODIUM 500 MG/5ML IV SOLN
INTRAVENOUS | Status: DC | PRN
  Administered 2020-10-16: 130 mg via INTRAVENOUS

## 2020-10-16 MED ORDER — ORAL CARE MOUTH RINSE: 15.0000 mL | Freq: Once | OROMUCOSAL | Status: AC

## 2020-10-16 MED ORDER — METHOCARBAMOL 500 MG PO TABS
500.0000 mg | ORAL_TABLET | Freq: Four times a day (QID) | ORAL | Status: DC | PRN
  Administered 2020-10-16 – 2020-10-20 (×7): 500 mg via ORAL
  Filled 2020-10-16 (×8): qty 1

## 2020-10-16 MED ORDER — POTASSIUM CHLORIDE IN NACL 20-0.9 MEQ/L-% IV SOLN
INTRAVENOUS | Status: DC
  Filled 2020-10-16 (×3): qty 1000

## 2020-10-16 MED ORDER — MORPHINE SULFATE 1 MG/ML IV SOLN PCA
INTRAVENOUS | Status: AC
  Filled 2020-10-16: qty 30

## 2020-10-16 MED ORDER — PROPOFOL 10 MG/ML IV BOLUS
INTRAVENOUS | Status: DC | PRN
  Administered 2020-10-16: 100 mg via INTRAVENOUS

## 2020-10-16 MED ORDER — NALOXONE HCL 0.4 MG/ML IJ SOLN: 0.4000 mg | INTRAMUSCULAR | Status: DC | PRN

## 2020-10-16 MED ORDER — PHENYLEPHRINE 40 MCG/ML (10ML) SYRINGE FOR IV PUSH (FOR BLOOD PRESSURE SUPPORT)
PREFILLED_SYRINGE | INTRAVENOUS | Status: DC | PRN
  Administered 2020-10-16 (×2): 80 ug via INTRAVENOUS

## 2020-10-16 MED ORDER — AMISULPRIDE (ANTIEMETIC) 5 MG/2ML IV SOLN
10.0000 mg | Freq: Once | INTRAVENOUS | Status: AC
  Administered 2020-10-16: 10 mg via INTRAVENOUS

## 2020-10-16 MED ORDER — MORPHINE SULFATE 1 MG/ML IV SOLN PCA
INTRAVENOUS | Status: DC
  Administered 2020-10-16: 6 mg via INTRAVENOUS
  Administered 2020-10-16: 8.28 mg via INTRAVENOUS
  Administered 2020-10-17: 3 mg via INTRAVENOUS
  Filled 2020-10-16: qty 30

## 2020-10-16 MED ORDER — HYDROMORPHONE HCL 1 MG/ML IJ SOLN
INTRAMUSCULAR | Status: AC
  Filled 2020-10-16: qty 1

## 2020-10-16 MED ORDER — AMISULPRIDE (ANTIEMETIC) 5 MG/2ML IV SOLN
INTRAVENOUS | Status: AC
  Filled 2020-10-16: qty 4

## 2020-10-16 SURGICAL SUPPLY — 49 items
ADH SKN CLS APL DERMABOND .7 (GAUZE/BANDAGES/DRESSINGS)
APL PRP STRL LF DISP 70% ISPRP (MISCELLANEOUS) ×1
BAG COUNTER SPONGE SURGICOUNT (BAG) ×2 IMPLANT
BAG SPNG CNTER NS LX DISP (BAG) ×1
BINDER ABDOMINAL 12 ML 46-62 (SOFTGOODS) ×1 IMPLANT
BLADE CLIPPER SURG (BLADE) IMPLANT
CANISTER SUCT 3000ML PPV (MISCELLANEOUS) ×2 IMPLANT
CHLORAPREP W/TINT 26 (MISCELLANEOUS) ×2 IMPLANT
COVER SURGICAL LIGHT HANDLE (MISCELLANEOUS) ×2 IMPLANT
DERMABOND ADVANCED (GAUZE/BANDAGES/DRESSINGS)
DERMABOND ADVANCED .7 DNX12 (GAUZE/BANDAGES/DRESSINGS) IMPLANT
DRAIN CHANNEL 19F RND (DRAIN) ×1 IMPLANT
DRAPE LAPAROSCOPIC ABDOMINAL (DRAPES) ×2 IMPLANT
DRSG OPSITE POSTOP 4X12 (GAUZE/BANDAGES/DRESSINGS) ×1 IMPLANT
DRSG TEGADERM 4X4.75 (GAUZE/BANDAGES/DRESSINGS) IMPLANT
ELECT REM PT RETURN 9FT ADLT (ELECTROSURGICAL) ×2
ELECTRODE REM PT RTRN 9FT ADLT (ELECTROSURGICAL) ×1 IMPLANT
EVACUATOR SILICONE 100CC (DRAIN) ×1 IMPLANT
GAUZE SPONGE 4X4 12PLY STRL (GAUZE/BANDAGES/DRESSINGS) ×1 IMPLANT
GLOVE SURG POLYISO LF SZ6 (GLOVE) ×1 IMPLANT
GLOVE SURG SIGNA 7.5 PF LTX (GLOVE) ×2 IMPLANT
GOWN STRL REUS W/ TWL LRG LVL3 (GOWN DISPOSABLE) ×1 IMPLANT
GOWN STRL REUS W/ TWL XL LVL3 (GOWN DISPOSABLE) ×1 IMPLANT
GOWN STRL REUS W/TWL LRG LVL3 (GOWN DISPOSABLE) ×2
GOWN STRL REUS W/TWL XL LVL3 (GOWN DISPOSABLE) ×2
KIT BASIN OR (CUSTOM PROCEDURE TRAY) ×2 IMPLANT
KIT TURNOVER KIT B (KITS) ×2 IMPLANT
MARKER SKIN DUAL TIP RULER LAB (MISCELLANEOUS) ×1 IMPLANT
MESH VENTRALIGHT ST 6X8 (Mesh Specialty) ×2 IMPLANT
MESH VENTRLGHT ELLIPSE 8X6XMFL (Mesh Specialty) IMPLANT
NDL HYPO 25GX1X1/2 BEV (NEEDLE) ×1 IMPLANT
NEEDLE HYPO 25GX1X1/2 BEV (NEEDLE) ×2 IMPLANT
NS IRRIG 1000ML POUR BTL (IV SOLUTION) ×2 IMPLANT
PACK GENERAL/GYN (CUSTOM PROCEDURE TRAY) ×2 IMPLANT
PAD ARMBOARD 7.5X6 YLW CONV (MISCELLANEOUS) ×2 IMPLANT
PENCIL SMOKE EVACUATOR (MISCELLANEOUS) ×2 IMPLANT
STAPLER VISISTAT 35W (STAPLE) IMPLANT
SUT ETHILON 2 0 FS 18 (SUTURE) ×1 IMPLANT
SUT MNCRL AB 4-0 PS2 18 (SUTURE) ×1 IMPLANT
SUT NOVA NAB DX-16 0-1 5-0 T12 (SUTURE) ×2 IMPLANT
SUT PDS AB 0 CT 36 (SUTURE) ×2 IMPLANT
SUT PROLENE 1 CT (SUTURE) IMPLANT
SUT VIC AB 3-0 SH 27 (SUTURE) ×2
SUT VIC AB 3-0 SH 27XBRD (SUTURE) IMPLANT
SYR CONTROL 10ML LL (SYRINGE) IMPLANT
TAPE CLOTH SURG 4X10 WHT LF (GAUZE/BANDAGES/DRESSINGS) ×1 IMPLANT
TOWEL GREEN STERILE (TOWEL DISPOSABLE) ×2 IMPLANT
TOWEL GREEN STERILE FF (TOWEL DISPOSABLE) ×2 IMPLANT
TRAY FOLEY MTR SLVR 14FR STAT (SET/KITS/TRAYS/PACK) IMPLANT

## 2020-10-16 NOTE — Op Note (Signed)
INCISIONAL HERNIA REPAIR WITH MESH  Procedure Note  Miguel [name] 10/16/2020   Pre-op Diagnosis: INCISIONAL HERNIA     Post-op Diagnosis: same  Procedure(s): INCISIONAL HERNIA REPAIR WITH MESH (15 cm x 20 cm Bard ventralite ST)  Surgeon(s): [name], MD Erroll Luna, MD  Anesthesia: General  Staff:  Circulator: Gladstone Lighter, RN; Dory Peru, RN Relief Scrub: Rulon Abide, Amy L, CST Scrub Person: Buddy Duty, CST  Estimated Blood Loss: Minimal               Indications: This is a 70 year old gentleman with multiple abdominal procedures.  He has multiple small fascial defects creating incisional hernias and a very lax abdominal wall.  He is symptomatic from the hernias so decision was made to proceed with an open repair with mesh  Procedure: The patient was brought to operating device correct patient.  He is placed upon the operating table general anesthesia induced.  His abdomen was prepped and draped in usual sterile fashion.  I created a midline incision through his previous scar with a scalpel.  I then dissected down through the subcutaneous tissue and fascia and identified the peritoneum.  I then gained entrance to the peritoneal cavity.  The patient had minimal adhesions which were taken down with the cautery.  These were just to the abdominal wall.  Again he had a small fascial defect in the epigastrium as well as 1 near the umbilicus.  The rest of his fascia was strong and intact.  I can feel no hernia defect or fascial defects at his previous ostomy site as well.  At this point, the decision was made to place the mesh as an onlay to see if this would strengthen the laxity of his abdominal wall.  Hemostasis peer to be achieved.  We thus closed the midline fascia with a running 0 PDS suture closing all the small fascial defects along with the fascial closure.  Next a 15 cm x 20 cm piece of Bard ventral light ST mesh was brought to the field.  We created  skin flaps laterally with the cautery staying just above the fascia circumferentially.  We then irrigated the wound with saline and achieved hemostasis with the cautery.  We then placed the mesh as an only over the fascia overlapping the midline greater than 5 cm.  The mesh was sewn in place with interrupted #1 Novafil sutures.  Again wide coverage of the fascia appeared to be achieved.  Hemostasis also appeared to be achieved.  I made a separate skin incision and placed a 19 [place] Blake drain over the mesh.  This was sewn in place with a nylon suture.  I then closed subtenons tissue with interrupted and running 3-0 Vicryl sutures and closed the skin with skin staples.  Occlusive dressings were then applied.  The patient was placed in a binder.  The drain was placed to bulb suction.  He tolerated the procedure well.  All the counts were correct at the end of the procedure.  He was then extubated in the operating room and taken in stable addition to the recovery room.          [name]   Date: 10/16/2020  Time: 12:08 PM

## 2020-10-16 NOTE — Progress Notes (Signed)
Evaluated patient in PACU for hypotension. On my arrival, patient had been given some albumin and LR and BP had improved. Patient alert and conversant. Abdomen appropriately sore but not rigid. JP intact with SS fluid in bulb. I-stat hgb 11.6, pre-op was 13.7 on 7/13. Ordered repeat CBC for 1800. Patient appears stable currently.  Norm Parcel, [facility] 10/16/2020, 3:43 PM Please see Amion for pager number during day hours 7:00am-4:30pm

## 2020-10-16 NOTE — Anesthesia Postprocedure Evaluation (Signed)
Anesthesia Post Note  Patient: Miguel [name]  Procedure(s) Performed: INCISIONAL HERNIA REPAIR WITH MESH (Abdomen)     Patient location during evaluation: PACU Anesthesia Type: General Level of consciousness: awake and alert Pain management: pain level controlled Vital Signs Assessment: post-procedure vital signs reviewed and stable Respiratory status: spontaneous breathing, nonlabored ventilation and respiratory function stable Cardiovascular status: blood pressure returned to baseline, stable and tachycardic Postop Assessment: no apparent nausea or vomiting Anesthetic complications: no   No notable events documented.  Last Vitals:  Vitals:   10/16/20 1425 10/16/20 1440  BP: 117/69 119/74  Pulse: (!) 113 (!) 112  Resp: (!) 22 16  Temp:    SpO2: 97% 94%    Last Pain:  Vitals:   10/16/20 1425  TempSrc:   PainSc: Asleep                 Jasmina Gendron,W. EDMOND

## 2020-10-16 NOTE — Transfer of Care (Signed)
Immediate Anesthesia Transfer of Care Note  Patient: Miguel [name]  Procedure(s) Performed: INCISIONAL HERNIA REPAIR WITH MESH (Abdomen)  Patient Location: PACU  Anesthesia Type:General  Level of Consciousness: awake, alert , oriented and patient cooperative  Airway & Oxygen Therapy: Patient Spontanous Breathing  Post-op Assessment: Report given to RN, Post -op Vital signs reviewed and stable and Patient moving all extremities X 4  Post vital signs: Reviewed and stable  Last Vitals:  Vitals Value Taken Time  BP    Temp    Pulse 77 10/16/20 1236  Resp    SpO2 98 % 10/16/20 1236  Vitals shown include unvalidated device data.  Last Pain:  Vitals:   10/16/20 0902  TempSrc:   PainSc: 0-No pain         Complications: No notable events documented.

## 2020-10-16 NOTE — Anesthesia Procedure Notes (Signed)
Procedure Name: Intubation Date/Time: 10/16/2020 11:03 AM Performed by: Jonna Munro, CRNA Pre-anesthesia Checklist: Patient identified, Emergency Drugs available, Suction available, Patient being monitored and Timeout performed Patient Re-evaluated:Patient Re-evaluated prior to induction Oxygen Delivery Method: Circle system utilized Preoxygenation: Pre-oxygenation with 100% oxygen Induction Type: IV induction Ventilation: Mask ventilation without difficulty Laryngoscope Size: Mac and 3 Grade View: Grade I Tube type: Oral Tube size: 7.0 mm Number of attempts: 1 Airway Equipment and Method: Stylet Placement Confirmation: ETT inserted through vocal cords under direct vision, positive ETCO2 and breath sounds checked- equal and bilateral Secured at: 23 cm Tube secured with: Tape Dental Injury: Teeth and Oropharynx as per pre-operative assessment

## 2020-10-16 NOTE — Interval H&P Note (Signed)
History and Physical Interval Note: no change in H and P  10/16/2020 9:55 AM  Miguel [name]  has presented today for surgery, with the diagnosis of INCISIONAL HERNIA.  The various methods of treatment have been discussed with the patient and family. After consideration of risks, benefits and other options for treatment, the patient has consented to  Procedure(s): [place] (N/A) as a surgical intervention.  The patient's history has been reviewed, patient examined, no change in status, stable for surgery.  I have reviewed the patient's chart and labs.  Questions were answered to the patient's satisfaction.     [name]

## 2020-10-16 NOTE — Progress Notes (Addendum)
New admission into 6N 20 for continuation of care. VSS, PCA active, RA. Wife at bedside.  18:51 PM: Morphine injection syringe near empty, no replacement  bag in the pyxis. Also NS+K infusion not available, requested as well. LR continued at 75%. Charge nurse aware, pharmacy notified. 1900: PCA pump turned off, medication not received yet from pharmacy.

## 2020-10-16 NOTE — Addendum Note (Signed)
Addendum  created 10/16/20 1621 by Moshe Salisbury, CRNA   Intraprocedure Meds edited

## 2020-10-17 ENCOUNTER — Encounter (HOSPITAL_COMMUNITY): Payer: Self-pay | Admitting: Surgery

## 2020-10-17 LAB — BASIC METABOLIC PANEL
Anion gap: 5 (ref 5–15)
BUN: 8 mg/dL (ref 8–23)
CO2: 27 mmol/L (ref 22–32)
Calcium: 8.4 mg/dL — ABNORMAL LOW (ref 8.9–10.3)
Chloride: 101 mmol/L (ref 98–111)
Creatinine, Ser: 1.01 mg/dL (ref 0.61–1.24)
GFR, Estimated: >60 mL/min (ref >60)
Glucose, Bld: 125 mg/dL — ABNORMAL HIGH (ref 70–99)
Potassium: 4.5 mmol/L (ref 3.5–5.1)
Sodium: 133 mmol/L — ABNORMAL LOW (ref 135–145)

## 2020-10-17 LAB — CBC
HCT: 28.3 % — ABNORMAL LOW (ref 39.0–52.0)
Hemoglobin: 9.4 g/dL — ABNORMAL LOW (ref 13.0–17.0)
MCH: 32.2 pg (ref 26.0–34.0)
MCHC: 33.2 g/dL (ref 30.0–36.0)
MCV: 96.9 fL (ref 80.0–100.0)
Platelets: 169 10*3/uL (ref 150–400)
RBC: 2.92 MIL/uL — ABNORMAL LOW (ref 4.22–5.81)
RDW: 12.8 % (ref 11.5–15.5)
WBC: 9.1 10*3/uL (ref 4.0–10.5)
nRBC: 0 % (ref 0.0–0.2)

## 2020-10-17 MED ORDER — HYDROMORPHONE HCL 1 MG/ML IJ SOLN
1.0000 mg | INTRAMUSCULAR | Status: DC | PRN
  Administered 2020-10-17 – 2020-10-20 (×11): 1 mg via INTRAVENOUS
  Filled 2020-10-17 (×11): qty 1

## 2020-10-17 MED ORDER — ZOLPIDEM TARTRATE 5 MG PO TABS
5.0000 mg | ORAL_TABLET | Freq: Every day | ORAL | Status: DC
  Administered 2020-10-17 – 2020-10-19 (×3): 5 mg via ORAL
  Filled 2020-10-17 (×3): qty 1

## 2020-10-17 MED ORDER — ZOLPIDEM TARTRATE 5 MG PO TABS: 5.0000 mg | ORAL_TABLET | Freq: Every day | ORAL | Status: DC

## 2020-10-17 NOTE — Progress Notes (Signed)
Administerd 2L O2 [place] d/t O2 sats in the upper 80s/low 90s while sleeping

## 2020-10-17 NOTE — Progress Notes (Signed)
1 Day Post-Op   Subjective/Chief Complaint: Pain well controlled this morning Hemodynamically stable over night   Objective: Vital signs in last 24 hours: Temp:  [97.5 F (36.4 C)-99.1 F (37.3 C)] 98 F (36.7 C) (07/22 0725) Pulse Rate:  [76-129] 89 (07/22 0725) Resp:  [10-22] 18 (07/22 0725) BP: (68-184)/(34-111) 132/77 (07/22 0725) SpO2:  [94 %-100 %] 98 % (07/22 0725) FiO2 (%):  [0 %] 0 % (07/21 1811) Weight:  [57 kg] 57 kg (07/21 0857) Last BM Date: 10/16/20  Intake/Output from previous day: 07/21 0701 - 07/22 0700 In: 2422.8 [P.O.:480; I.V.:1842.8; IV Piggyback:100] Out: 2160 [Urine:1875; Drains:235; Blood:50] Intake/Output this shift: No intake/output data recorded.  Exam: Awake and alert Comfortable Abdomen soft, binder in place, drain serosang  Lab Results:  Recent Labs    10/16/20 1810 10/17/20 0209  WBC 16.0* 9.1  HGB 11.2* 9.4*  HCT 33.3* 28.3*  PLT 197 169   BMET Recent Labs    10/16/20 1431 10/17/20 0209  NA 139 133*  K 3.9 4.5  CL  --  101  CO2  --  27  GLUCOSE  --  125*  BUN  --  8  CREATININE  --  1.01  CALCIUM  --  8.4*   PT/INR No results for input(s): LABPROT, INR in the last 72 hours. ABG Recent Labs    10/16/20 1431  HCO3 32.4*    Studies/Results: No results found.  Anti-infectives: Anti-infectives (From admission, onward)    Start     Dose/Rate Route Frequency Ordered Stop   10/16/20 0845  ceFAZolin (ANCEF) IVPB 2g/100 mL premix        2 g 200 mL/hr over 30 Minutes Intravenous On call to O.R. 10/16/20 0835 10/16/20 1119       Assessment/Plan: s/p Procedure(s): INCISIONAL HERNIA REPAIR WITH MESH (N/A)  Hgb down.  I suspect this is dilutional and that he was fairly dry preop.  He responded well to fluid.  D/c foley  Ambulate  D/c PCA    LOS: 1 day    [name] 10/17/2020

## 2020-10-18 LAB — CBC
HCT: 22.3 % — ABNORMAL LOW (ref 39.0–52.0)
Hemoglobin: 7.6 g/dL — ABNORMAL LOW (ref 13.0–17.0)
MCH: 32.9 pg (ref 26.0–34.0)
MCHC: 34.1 g/dL (ref 30.0–36.0)
MCV: 96.5 fL (ref 80.0–100.0)
Platelets: 168 10*3/uL (ref 150–400)
RBC: 2.31 MIL/uL — ABNORMAL LOW (ref 4.22–5.81)
RDW: 13.2 % (ref 11.5–15.5)
WBC: 11.8 10*3/uL — ABNORMAL HIGH (ref 4.0–10.5)
nRBC: 0 % (ref 0.0–0.2)

## 2020-10-18 LAB — PREPARE RBC (CROSSMATCH)

## 2020-10-18 MED ORDER — BOOST / RESOURCE BREEZE PO LIQD CUSTOM
1.0000 | Freq: Three times a day (TID) | ORAL | Status: DC
  Administered 2020-10-18 – 2020-10-20 (×4): 1 via ORAL

## 2020-10-18 MED ORDER — SODIUM CHLORIDE 0.9% IV SOLUTION: Freq: Once | INTRAVENOUS | Status: AC

## 2020-10-18 MED ORDER — SODIUM CHLORIDE 0.9% IV SOLUTION: Freq: Once | INTRAVENOUS | Status: DC

## 2020-10-18 NOTE — Progress Notes (Addendum)
2 Days Post-Op   Subjective/Chief Complaint: No complaints Pain controlled   Objective: Vital signs in last 24 hours: Temp:  [97.6 F (36.4 C)-98.4 F (36.9 C)] 97.6 F (36.4 C) (07/23 0500) Pulse Rate:  [89-106] 100 (07/23 0500) Resp:  [15-18] 16 (07/23 0500) BP: (104-141)/(67-77) 141/77 (07/23 0500) SpO2:  [95 %-100 %] 97 % (07/23 0500) Last BM Date: 10/17/20  Intake/Output from previous day: 07/22 0701 - 07/23 0700 In: 1040 [P.O.:840; I.V.:200] Out: 436 [Urine:350; Emesis/NG output:1; Drains:85] Intake/Output this shift: Total I/O In: 560 [P.O.:360; I.V.:200] Out: 396 [Urine:350; Emesis/NG output:1; Drains:45]  Exam: Awake and alert Abdomen soft, non-distended, drain serosang  Lab Results:  Recent Labs    10/17/20 0209 10/18/20 0117  WBC 9.1 11.8*  HGB 9.4* 7.6*  HCT 28.3* 22.3*  PLT 169 168   BMET Recent Labs    10/16/20 1431 10/17/20 0209  NA 139 133*  K 3.9 4.5  CL  --  101  CO2  --  27  GLUCOSE  --  125*  BUN  --  8  CREATININE  --  1.01  CALCIUM  --  8.4*   PT/INR No results for input(s): LABPROT, INR in the last 72 hours. ABG Recent Labs    10/16/20 1431  HCO3 32.4*    Studies/Results: No results found.  Anti-infectives: Anti-infectives (From admission, onward)    Start     Dose/Rate Route Frequency Ordered Stop   10/16/20 0845  ceFAZolin (ANCEF) IVPB 2g/100 mL premix        2 g 200 mL/hr over 30 Minutes Intravenous On call to O.R. 10/16/20 0835 10/16/20 1119       Assessment/Plan: s/p Procedure(s): INCISIONAL HERNIA REPAIR WITH MESH (N/A)  Give low Hgb, will transfuse PRBC's.  Suspect part dilutional, part post op blood loss anemia.  I discussed with the patient who agrees with transfusion  Ambulate  Encourage po  LOS: 2 days    [name] 10/18/2020

## 2020-10-19 LAB — TYPE AND SCREEN
ABO/RH(D): A POS
Antibody Screen: NEGATIVE
Unit division: 0
Unit division: 0

## 2020-10-19 LAB — CBC
HCT: 22.4 % — ABNORMAL LOW (ref 39.0–52.0)
HCT: 23.6 % — ABNORMAL LOW (ref 39.0–52.0)
Hemoglobin: 7.8 g/dL — ABNORMAL LOW (ref 13.0–17.0)
Hemoglobin: 7.9 g/dL — ABNORMAL LOW (ref 13.0–17.0)
MCH: 32.2 pg (ref 26.0–34.0)
MCH: 31.3 pg (ref 26.0–34.0)
MCHC: 34.8 g/dL (ref 30.0–36.0)
MCHC: 33.5 g/dL (ref 30.0–36.0)
MCV: 92.6 fL (ref 80.0–100.0)
MCV: 93.7 fL (ref 80.0–100.0)
Platelets: 121 10*3/uL — ABNORMAL LOW (ref 150–400)
Platelets: 132 10*3/uL — ABNORMAL LOW (ref 150–400)
RBC: 2.42 MIL/uL — ABNORMAL LOW (ref 4.22–5.81)
RBC: 2.52 MIL/uL — ABNORMAL LOW (ref 4.22–5.81)
RDW: 14.2 % (ref 11.5–15.5)
RDW: 14.2 % (ref 11.5–15.5)
WBC: 6 10*3/uL (ref 4.0–10.5)
WBC: 5.6 10*3/uL (ref 4.0–10.5)
nRBC: 0 % (ref 0.0–0.2)
nRBC: 0 % (ref 0.0–0.2)

## 2020-10-19 LAB — BPAM RBC
Blood Product Expiration Date: 202208172359
Blood Product Expiration Date: 202208182359
ISSUE DATE / TIME: 202207231154
ISSUE DATE / TIME: 202207231543
Unit Type and Rh: 6200
Unit Type and Rh: 6200

## 2020-10-19 MED ORDER — GLYCERIN (LAXATIVE) 2 G RE SUPP
1.0000 | Freq: Once | RECTAL | Status: AC
  Administered 2020-10-19: 1 via RECTAL
  Filled 2020-10-19: qty 1

## 2020-10-19 NOTE — Progress Notes (Signed)
Pt had 2 small BM on dayshift. Pt ambulated 1 lap around the unit and multiple times during the day. He sat up in the chair for breakfast.  He had 2 waves of nausea--one calmed with rest and the second with an antiemetic.  Pt willing to alternate pain meds (IV, PO, IV, PO).

## 2020-10-19 NOTE — Progress Notes (Signed)
3 Days Post-Op    CC: Incisional hernia  Subjective: Is doing well this AM.  Minimal drainage through his JP.  Abdomen feels soft, not distended.  He has bowel sounds, tolerating a soft diet.  He has not eaten very much so far.  He has not had a bowel movement so far.  He thought he had some lightheaded feelings yesterday but does not complain of that today he was stable up walking.  Sounds like he is only been up and walked outside of the room once.  Objective: Vital signs in last 24 hours: Temp:  [98 F (36.7 C)-98.9 F (37.2 C)] 98.3 F (36.8 C) (07/24 1045) Pulse Rate:  [84-109] 98 (07/24 1045) Resp:  [14-18] 18 (07/24 1045) BP: (109-176)/(66-90) 149/90 (07/24 1045) SpO2:  [93 %-99 %] 99 % (07/24 1045) Last BM Date: 10/17/21 460 p.o. 500 IV Urine x2 Drain 30 Afebrile, vital signs are stable blood pressure this AM. Hemoglobin 13.7>> 11.7>> 9.4>> 7.6>> 7.8(7/24) Transfused 2 units packed RBCs 10/18/2020  Intake/Output from previous day: 07/23 0701 - 07/24 0700 In: 1023 [P.O.:460; I.V.:185; Blood:378] Out: 30 [Drains:30] Intake/Output this shift: Total I/O In: 480 [P.O.:480] Out: 30 [Drains:30]  General appearance: alert, cooperative, and mild distress Resp: clear to auscultation bilaterally GI: Soft, few bowel sounds, incision stable.  He is not distended.  No BM so far.  Drainage from JP is serosanguineous.  I did strip the drain.  Lab Results:  Recent Labs    10/18/20 0117 10/19/20 0156  WBC 11.8* 6.0  HGB 7.6* 7.8*  HCT 22.3* 22.4*  PLT 168 121*    BMET Recent Labs    10/16/20 1431 10/17/20 0209  NA 139 133*  K 3.9 4.5  CL  --  101  CO2  --  27  GLUCOSE  --  125*  BUN  --  8  CREATININE  --  1.01  CALCIUM  --  8.4*   PT/INR No results for input(s): LABPROT, INR in the last 72 hours.  No results for input(s): AST, ALT, ALKPHOS, BILITOT, PROT, ALBUMIN in the last 168 hours.   Lipase     Component Value Date/Time   LIPASE 44 11/13/2015 2300      Medications:  sodium chloride   Intravenous Once   acetaminophen  1,000 mg Oral Q6H   famotidine  20 mg Oral BID   feeding supplement  1 Container Oral TID BM   tamsulosin  0.4 mg Oral Daily   zolpidem  5 mg Oral QHS    0.9 % NaCl with KCl 20 mEq / L 10 mL/hr at 10/18/20 0640    Anti-infectives (From admission, onward)    Start     Dose/Rate Route Frequency Ordered Stop   10/16/20 0845  ceFAZolin (ANCEF) IVPB 2g/100 mL premix        2 g 200 mL/hr over 30 Minutes Intravenous On call to O.R. 10/16/20 0835 10/16/20 1119        Assessment/Plan  Incisional hernia Incisional hernia repair with mesh(15 x 20 cm Bard ventralite ST)10/16/20, Dr.Douglas Blackman POD #3  [id number] diet ID: Preop cefazolin DVT: SCDs added  Hx anemia BPH Hypertension Hyperlipidemia  Plan: Repeat CBC now and again recheck labs in AM.  Continue soft diet encouraged him to mobilize more.  I will encourage him to use the oral pain medication in place with the IV medications.  We will give him a glycerin suppository today and see if that helps with his BM's.  LOS: 3 days    Miguel [name] 10/19/2020 Please see Amion

## 2020-10-20 LAB — CBC
HCT: 23.3 % — ABNORMAL LOW (ref 39.0–52.0)
Hemoglobin: 7.8 g/dL — ABNORMAL LOW (ref 13.0–17.0)
MCH: 31.6 pg (ref 26.0–34.0)
MCHC: 33.5 g/dL (ref 30.0–36.0)
MCV: 94.3 fL (ref 80.0–100.0)
Platelets: 139 10*3/uL — ABNORMAL LOW (ref 150–400)
RBC: 2.47 MIL/uL — ABNORMAL LOW (ref 4.22–5.81)
RDW: 13.8 % (ref 11.5–15.5)
WBC: 4.7 10*3/uL (ref 4.0–10.5)
nRBC: 0 % (ref 0.0–0.2)

## 2020-10-20 LAB — BASIC METABOLIC PANEL
Anion gap: 7 (ref 5–15)
BUN: 11 mg/dL (ref 8–23)
CO2: 27 mmol/L (ref 22–32)
Calcium: 8.1 mg/dL — ABNORMAL LOW (ref 8.9–10.3)
Chloride: 98 mmol/L (ref 98–111)
Creatinine, Ser: 0.74 mg/dL (ref 0.61–1.24)
GFR, Estimated: >60 mL/min (ref >60)
Glucose, Bld: 99 mg/dL (ref 70–99)
Potassium: 3.8 mmol/L (ref 3.5–5.1)
Sodium: 132 mmol/L — ABNORMAL LOW (ref 135–145)

## 2020-10-20 MED ORDER — OXYCODONE HCL 5 MG PO TABS: 5.0000 mg | ORAL_TABLET | Freq: Four times a day (QID) | ORAL | 0 refills | Status: AC | PRN

## 2020-10-20 NOTE — Care Management Important Message (Signed)
Important Message  Patient Details  Name: Miguel [name] MRN: [id number] Date of Birth: 10/02/1950   Medicare Important Message Given:  Yes  Patient left prior to IM delivery will mail copy to the patient home address.    Camerin Jimenez 10/20/2020, 3:37 PM

## 2020-10-20 NOTE — Discharge Summary (Signed)
Physician Discharge Summary  Patient ID: Miguel [name] MRN: [id number] DOB/AGE: Apr 28, 1950 70 y.o.  Admit date: 10/16/2020 Discharge date: 10/20/2020  Admission Diagnoses:  Discharge Diagnoses:  Active Problems:   Incisional hernia Post op blood loss anemia  Discharged Condition: good  Hospital Course: Patient was admitted after an elective open incisional hernia repair with mesh.  He appeared fairly dehydrated at the time of surgery.  He was rehydrated.  His hemoglobin did drop this is felt to be delusional.  Because of dropping further and some hypotension he was transfused 2 units of blood and responded appropriately.  He tolerated the surgery well from a pain aspect.  He began ambulating well.  He started moving his bowels.  He was tolerating p.o.  By postoperative 4, his hemoglobin was stable.  His vitals were also normal.  His pain was controlled.  The decision was made to discharge patient to home with his drain in place for follow-up in 4 days  Consults: None  Significant Diagnostic Studies:   Treatments: surgery: Incisional hernia repair with mesh  Discharge Exam: Blood pressure 134/78, pulse 77, temperature 98.4 F (36.9 C), temperature source Oral, resp. rate 15, height '5\' 3"'$  (1.6 m), weight 57 kg, SpO2 96 %. General appearance: alert, cooperative, and no distress Resp: clear to auscultation bilaterally Cardio: regular rate and rhythm, S1, S2 normal, no murmur, click, rub or gallop Incision/Wound: Abdomen is soft.  Drain is serosanguineous.  Incision is healing well.  Disposition: Discharge disposition: 01-Home or Self Care        Allergies as of 10/20/2020   No Known Allergies      Medication List     TAKE these medications    diclofenac sodium 1 % Gel Commonly known as: VOLTAREN Apply 2 g topically 3 (three) times daily as needed (joint pain).   famotidine 20 MG tablet Commonly known as: PEPCID Take 20 mg by mouth 2 (two) times daily.   LORazepam  1 MG tablet Commonly known as: ATIVAN Take 0.5 mg by mouth daily as needed for anxiety.   methocarbamol 500 MG tablet Commonly known as: ROBAXIN Take 250-500 mg by mouth every 8 (eight) hours as needed for muscle spasms.   multivitamin with minerals Tabs tablet Take 1 tablet by mouth daily.   ondansetron 4 MG disintegrating tablet Commonly known as: ZOFRAN-ODT Take 4 mg by mouth every 6 (six) hours as needed for nausea/vomiting.   oxyCODONE 5 MG immediate release tablet Commonly known as: Oxy IR/ROXICODONE Take 1 tablet (5 mg total) by mouth every 6 (six) hours as needed for moderate pain, severe pain or breakthrough pain. What changed:  medication strength reasons to take this   pantoprazole 40 MG tablet Commonly known as: PROTONIX Take 40 mg by mouth 2 (two) times daily.   polyethylene glycol 17 g packet Commonly known as: MIRALAX / GLYCOLAX Take 17 g by mouth daily as needed for mild constipation.   PROBIOTIC DAILY PO Take 1 capsule by mouth daily.   tamsulosin 0.4 MG Caps capsule Commonly known as: FLOMAX Take 1 capsule (0.4 mg total) by mouth daily.   tiZANidine 2 MG tablet Commonly known as: ZANAFLEX Take 1-2 mg by mouth 2 (two) times daily as needed for muscle spasms.   traMADol 50 MG tablet Commonly known as: ULTRAM Take 50 mg by mouth every 6 (six) hours as needed for moderate pain.   vitamin B-12 1000 MCG tablet Commonly known as: CYANOCOBALAMIN Take 1,000 mcg by mouth daily.  zolpidem 10 MG tablet Commonly known as: AMBIEN Take 10 mg by mouth at bedtime.       ASK your doctor about these medications    acetaminophen 500 MG tablet Commonly known as: TYLENOL Take 1 tablet (500 mg total) by mouth every 8 (eight) hours as needed for mild pain (for pain).        Follow-up Information     [name], MD. Schedule an appointment as soon as possible for a visit on 10/24/2020.   Specialty: General Surgery Why: For suture removal Contact  information: [address] [place] [address] [phone]                 Signed: [name] 10/20/2020, 8:15 AM

## 2020-10-20 NOTE — Care Management Important Message (Signed)
Important Message  Patient Details  Name: Miguel [name] MRN: [id number] Date of Birth: [birth year]   Medicare Important Message Given:  Yes     Orbie Pyo 10/20/2020, 4:15 PM

## 2020-10-20 NOTE — Progress Notes (Signed)
Patient ID: Miguel [name], male   DOB: 20-Apr-1950, 70 y.o.   MRN: [id number]   He is doing well this morning. His hemoglobin is stable. His abdomen is soft.  Plan: Discharge after lunch today. Will follow-up in the office on Friday

## 2020-10-20 NOTE — Discharge Instructions (Signed)
[facility] _______[facility]  UMBILICAL OR INGUINAL HERNIA REPAIR: POST OP INSTRUCTIONS  Always review your discharge instruction sheet given to you by the facility where your surgery was performed. IF YOU HAVE DISABILITY OR FAMILY LEAVE FORMS, YOU MUST BRING THEM TO THE OFFICE FOR PROCESSING.   DO NOT GIVE THEM TO YOUR DOCTOR.  1. A  prescription for pain medication may be given to you upon discharge.  Take your pain medication as prescribed, if needed.  If narcotic pain medicine is not needed, then you may take acetaminophen (Tylenol) or ibuprofen (Advil) as needed. 2. Take your usually prescribed medications unless otherwise directed. If you need a refill on your pain medication, please contact your pharmacy.  They will contact our office to request authorization. Prescriptions will not be filled after 5 pm or on week-ends. 3. You should follow a light diet the first 24 hours after arrival home, such as soup and crackers, etc.  Be sure to include lots of fluids daily.  Resume your normal diet the day after surgery. 4.Most patients will experience some swelling and bruising around the umbilicus or in the groin and scrotum.  Ice packs and reclining will help.  Swelling and bruising can take several days to resolve.  6. It is common to experience some constipation if taking pain medication after surgery.  Increasing fluid intake and taking a stool softener (such as Colace) will usually help or prevent this problem from occurring.  A mild laxative (Milk of Magnesia or Miralax) should be taken according to package directions if there are no bowel movements after 48 hours. 7. Unless discharge instructions indicate otherwise, you may remove your bandages 24-48 hours after surgery, and you may shower at that time.  You may have steri-strips (small skin tapes) in place directly over the incision.  These strips should be left on the skin for 7-10 days.  If your surgeon used skin glue on the  incision, you may shower in 24 hours.  The glue will flake off over the next 2-3 weeks.  Any sutures or staples will be removed at the office during your follow-up visit. 8. ACTIVITIES:  You may resume regular (light) daily activities beginning the next day--such as daily self-care, walking, climbing stairs--gradually increasing activities as tolerated.  You may have sexual intercourse when it is comfortable.  Refrain from any heavy lifting or straining until approved by your doctor.  a.You may drive when you are no longer taking prescription pain medication, you can comfortably wear a seatbelt, and you can safely maneuver your car and apply brakes. b.RETURN TO WORK:   _____________________________________________  9.You should see your doctor in the office for a follow-up appointment approximately 2-3 weeks after your surgery.  Make sure that you call for this appointment within a day or two after you arrive home to insure a convenient appointment time. 10.OTHER INSTRUCTIONS: _OK TO SHOWER.  COVER DRAIN SITE WITH DRY GAUZE AFTER ICE PACK AND TYLENOL ALSO FOR PAIN NO LIFTING MORE THAN 15 POUNDS FOR 6 WEEKS________________________    _____________________________________  WHEN TO CALL YOUR DOCTOR: Fever over 101.0 Inability to urinate Nausea and/or vomiting Extreme swelling or bruising Continued bleeding from incision. Increased pain, redness, or drainage from the incision  The clinic staff is available to answer your questions during regular business hours.  Please don't hesitate to call and ask to speak to one of the nurses for clinical concerns.  If you have a medical emergency, go to the nearest emergency  room or call 911.  A surgeon from [facility] is always on call at the hospital   [address] [place], [place], [place]  [address] ?  [address] [place], [place],    [address] [phone] ? [phone] ? FAX [phone] Web site:  [email or web address]

## 2020-10-20 NOTE — Progress Notes (Signed)
Patient discharged to home with instructions and dressing supplies.

## 2020-10-24 LAB — COLOGUARD: COLOGUARD: NEGATIVE

## 2020-11-03 DIAGNOSIS — R350 Frequency of micturition: Secondary | ICD-10-CM | POA: Diagnosis not present

## 2020-11-03 DIAGNOSIS — M549 Dorsalgia, unspecified: Secondary | ICD-10-CM | POA: Diagnosis not present

## 2020-11-03 DIAGNOSIS — I10 Essential (primary) hypertension: Secondary | ICD-10-CM | POA: Diagnosis not present

## 2020-11-17 DIAGNOSIS — I10 Essential (primary) hypertension: Secondary | ICD-10-CM | POA: Diagnosis not present

## 2020-11-24 ENCOUNTER — Encounter: Payer: Self-pay | Admitting: Orthopaedic Surgery

## 2020-11-24 ENCOUNTER — Ambulatory Visit (INDEPENDENT_AMBULATORY_CARE_PROVIDER_SITE_OTHER): Payer: Medicare Other | Admitting: Orthopaedic Surgery

## 2020-11-24 ENCOUNTER — Other Ambulatory Visit: Payer: Self-pay

## 2020-11-24 DIAGNOSIS — M5442 Lumbago with sciatica, left side: Secondary | ICD-10-CM | POA: Diagnosis not present

## 2020-11-24 DIAGNOSIS — G8929 Other chronic pain: Secondary | ICD-10-CM | POA: Diagnosis not present

## 2020-11-24 DIAGNOSIS — M545 Low back pain, unspecified: Secondary | ICD-10-CM

## 2020-11-24 MED ORDER — METHYLPREDNISOLONE 4 MG PO TABS
ORAL_TABLET | ORAL | 0 refills | Status: DC
Start: 2020-11-24 — End: 2023-11-12

## 2020-11-24 MED ORDER — METHYLPREDNISOLONE 4 MG PO TABS: ORAL_TABLET | ORAL | 0 refills | Status: DC

## 2020-11-24 NOTE — Progress Notes (Signed)
The patient is well-known to me.  He comes in with an acute flareup of left-sided low back pain that seems to be in the facet joints and around the SI joint and sciatic area on the left side.  He is a very cachectic individual.  He has had physical therapy before and is interested in trying this again.  He has had no acute change in medical status but did have some surgery again a month ago by general surgery for abdominal issues that are chronic.  He states that he feels like they finally have taken care of his abdominal issues.  He has had an acute flareup of this chronic low back pain.  On exam he seems to hurt in the lower aspect of his lumbar spine just to the left side and into the SI joint and sciatic area.  He is very cachectic.  He has negative straight leg raise to that side and no gross weakness on the left side.  Previous spine films from a year ago showed no acute findings.  I would like to send him to outpatient physical therapy withdrawal bridge for any modalities that can help decrease his pain in this area.  He is already trying Voltaren gel.  Since he is not a diabetic I will put him on a 6-day steroid taper to see if this will help.  I will see him back in a month to see how he is doing overall.  The next step will be considering sending him to Dr. Ernestina Patches for left-sided facet versus SI joint injection.

## 2020-11-26 ENCOUNTER — Ambulatory Visit (HOSPITAL_BASED_OUTPATIENT_CLINIC_OR_DEPARTMENT_OTHER): Payer: Medicare Other | Attending: Orthopaedic Surgery | Admitting: Physical Therapy

## 2020-11-26 ENCOUNTER — Encounter (HOSPITAL_BASED_OUTPATIENT_CLINIC_OR_DEPARTMENT_OTHER): Payer: Self-pay | Admitting: Physical Therapy

## 2020-11-26 ENCOUNTER — Other Ambulatory Visit: Payer: Self-pay

## 2020-11-26 DIAGNOSIS — M546 Pain in thoracic spine: Secondary | ICD-10-CM | POA: Diagnosis not present

## 2020-11-26 DIAGNOSIS — R293 Abnormal posture: Secondary | ICD-10-CM | POA: Diagnosis not present

## 2020-11-26 DIAGNOSIS — M545 Low back pain, unspecified: Secondary | ICD-10-CM | POA: Insufficient documentation

## 2020-11-26 DIAGNOSIS — R252 Cramp and spasm: Secondary | ICD-10-CM | POA: Diagnosis not present

## 2020-11-26 DIAGNOSIS — G8929 Other chronic pain: Secondary | ICD-10-CM | POA: Diagnosis not present

## 2020-11-27 NOTE — Therapy (Signed)
[facility] [address] [place], [name], [address] Phone: [phone]   Fax:  [phone]  Physical Therapy Evaluation  Patient Details  Name: Miguel [name] MRN: [id number] Date of Birth: 12/03/1950 Referring Provider (PT): Dr Jean Rosenthal   Encounter Date: 11/26/2020   PT End of Session - 11/26/20 1526     Visit Number 1    Number of Visits 12    Date for PT Re-Evaluation 01/07/21    Authorization Type MCR A and B    PT Start Time [id number]    PT Stop Time 1558    PT Time Calculation (min) 43 min    Activity Tolerance Patient tolerated treatment well    Behavior During Therapy [facility] for tasks assessed/performed             Past Medical History:  Diagnosis Date   Anemia    Anxiety    BPH (benign prostatic hyperplasia)    Gastritis    GERD (gastroesophageal reflux disease)    "seldom" (07/16/2016)   GI bleed due to NSAIDs 10/27/2015   History of blood transfusion 06/2016   post OR/notes 07/15/2016   History of hiatal hernia    History of kidney stones    surgery to remove stone   Hyperlipidemia    Hypertension    no meds    Melanoma of back ([place])    "mid back"   Sigmoid diverticulitis    with perforation    Past Surgical History:  Procedure Laterality Date   COLON SURGERY     sigmoid   COLOSTOMY TAKEDOWN N/A 06/28/2016   Procedure: COLOSTOMY TAKEDOWN;  Surgeon: [name], MD;  Location: [facility] OR;  Service: General;  Laterality: N/A;   CYSTOSCOPY WITH RETROGRADE PYELOGRAM, URETEROSCOPY AND STENT PLACEMENT Bilateral 02/12/2020   Procedure: CYSTOSCOPY WITH BILATERAL RETROGRADE PYELOGRAM,  AND LITHOPEXY;  Surgeon: Remi Haggard, MD;  Location: [facility];  Service: Urology;  Laterality: Bilateral;  1 HR   ESOPHAGOGASTRODUODENOSCOPY N/A 10/27/2015   Procedure: ESOPHAGOGASTRODUODENOSCOPY (EGD);  Surgeon: Clarene Essex, MD;  Location: [facility] ENDOSCOPY;  Service: Endoscopy;  Laterality: N/A;    ESOPHAGOGASTRODUODENOSCOPY N/A 10/29/2015   Procedure: ESOPHAGOGASTRODUODENOSCOPY (EGD);  Surgeon: Ronald Lobo, MD;  Location: [facility] ENDOSCOPY;  Service: Endoscopy;  Laterality: N/A;   ESOPHAGOGASTRODUODENOSCOPY N/A 10/30/2015   Procedure: ESOPHAGOGASTRODUODENOSCOPY (EGD);  Surgeon: Clarene Essex, MD;  Location: [facility] ENDOSCOPY;  Service: Endoscopy;  Laterality: N/A;   g tube discontinued  04/2020   per patient   [place]  11/21/2015   REDUCTION OF HIATAL HERNIA , REPAIR HIATAL HERNIA, RESECTION SMALL BOWEL WITH ANASTOMOSIS, PLACEMENT GASTROSTOMY TUBE, PLACEMENT DUODENOSTOMY TUBE (N/A)   HEMORRHOID BANDING  X 2   HERNIA REPAIR     HIATAL HERNIA REPAIR N/A 11/21/2015   Procedure: REDUCTION OF HIATAL HERNIA , REPAIR HIATAL HERNIA, RESECTION SMALL BOWEL WITH ANASTOMOSIS, PLACEMENT GASTROSTOMY TUBE, PLACEMENT DUODENOSTOMY TUBE;  Surgeon: Mickeal Skinner, MD;  Location: [facility];  Service: General;  Laterality: N/A;   HOLMIUM LASER APPLICATION Right [id number]   Procedure: HOLMIUM LASER APPLICATION;  Surgeon: Remi Haggard, MD;  Location: [facility];  Service: Urology;  Laterality: Right;   INCISIONAL HERNIA REPAIR  06/28/2016   open/notes 07/15/2016   INCISIONAL HERNIA REPAIR  03/17/2018   WITH MESH   INCISIONAL HERNIA REPAIR N/A 03/17/2018   Procedure: INCISIONAL HERNIA REPAIR WITH MESH;  Surgeon: [name], MD;  Location: [facility];  Service: General;  Laterality: N/A;   [place] N/A  10/16/2020   Procedure: INCISIONAL HERNIA REPAIR WITH MESH;  Surgeon: [name], MD;  Location: [facility];  Service: General;  Laterality: N/A;   INGUINAL HERNIA REPAIR Bilateral 09/28/2018   INGUINAL HERNIA REPAIR Bilateral 09/28/2018   Procedure: BILATERAL OPEN INGUINAL HERNIA REPAIR WITH MESH;  Surgeon: [name], MD;  Location: [facility];  Service: General;  Laterality: Bilateral;  GENERAL AND TAP BLOCK   INSERTION OF MESH N/A 03/17/2018   Procedure: INSERTION OF MESH;   Surgeon: [name], MD;  Location: [facility];  Service: General;  Laterality: N/A;   INSERTION OF MESH Bilateral 09/28/2018   Procedure: Insertion Of Mesh;  Surgeon: [name], MD;  Location: [facility];  Service: General;  Laterality: Bilateral;   IR CM INJ ANY COLONIC TUBE W/FLUORO  02/04/2017   IR GUIDED DRAIN W CATHETER PLACEMENT  07/06/2016   /NOTES 07/15/2016   IR PATIENT EVAL TECH 0-60 MINS  06/28/2019   IR RADIOLOGIST EVAL & MGMT  07/27/2016   IR RADIOLOGIST EVAL & MGMT  08/17/2016   IR RADIOLOGIST EVAL & MGMT  08/26/2016   IR REPLACE G-TUBE SIMPLE WO FLUORO  07/28/2017   IR REPLACE G-TUBE SIMPLE WO FLUORO  01/31/2018   IR REPLACE G-TUBE SIMPLE WO FLUORO  10/16/2018   IR REPLACE G-TUBE SIMPLE WO FLUORO  03/05/2019   IR REPLACE G-TUBE SIMPLE WO FLUORO  08/22/2019   IR REPLACE G-TUBE SIMPLE WO FLUORO  01/08/2020   IR [place] GASTRO/COLONIC TUBE PERCUT W/FLUORO  08/04/2016   IR [place] GASTRO/COLONIC TUBE PERCUT W/FLUORO  01/14/2017   IR US GUIDE BX ASP/DRAIN  08/04/2016   KNEE CARTILAGE SURGERY Right 1971   "opened me up"   LAPAROTOMY N/A 07/05/2015   Procedure: PARTIAL SIGMOID COLECTOMY AND COLOSTOMY;  Surgeon: [name], MD;  Location: [facility] OR;  Service: General;  Laterality: N/A;   MELANOMA EXCISION  2001   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    Subjective Assessment - 11/26/20 1519     Subjective Patient had an extensive abdominal hernia repair about 5 weeks ago. He has had increased low back point sicne that point. His pain is in his right buttock and lower back and left buttock. He was seen prevously  several month ago and was having problems with leakage from an old feeding tube area. That has healed up.    Pertinent History extensive abdominal surgerys ( 8 surgeies total)    Limitations Standing;Walking    How long can you stand comfortably? pain increased with standing time    How long can you walk comfortably? limited distances    Diagnostic tests nothing sicne the operation    Patient Stated Goals to have less pain    Currently in Pain? Yes    Pain Score 5     Pain Location Back    Pain Orientation Right;Left    Pain Descriptors / Indicators Aching    Pain Radiating Towards can radiate into the left buttock    Pain Onset More than a month ago    Pain Frequency Intermittent  Aggravating Factors  standing and walking    Pain Relieving Factors rest    Effect of Pain on Daily Activities difficulty perfroming ADL;s                [facility] PT Assessment - 11/27/20 0001       Assessment   Medical Diagnosis Low Back Pain    Referring Provider (PT) Dr Jean Rosenthal    Onset Date/Surgical Date --   has increased over the past 5 months   Hand Dominance Right    Next MD Visit nothing scheduled    Prior Therapy Had therapy ith singiifcant improvement last year      Precautions   Precautions None      Restrictions   Weight Bearing Restrictions No      Balance Screen   Has the patient fallen in the past 6 months No    Has the patient had a decrease in activity level because of a fear of falling?  No    Is the patient reluctant to leave their home because of a fear of falling?  No      Home Ecologist residence    Living Arrangements Spouse/significant other      Prior Function   Level of [place] Retired    Leisure biking      Cognition   Overall Cognitive Status Within Functional Limits for tasks assessed    Attention Focused    Focused Attention Appears intact    Memory Appears intact    Awareness  Appears intact    Problem Solving Appears intact      Sensation   Light Touch Appears Intact    Additional Comments denies parathesias      Coordination   Gross Motor Movements are Fluid and Coordinated Yes    Fine Motor Movements are Fluid and Coordinated Yes      Posture/Postural Control   Posture Comments kyphotic posture      AROM   Overall AROM Comments full acive ROM but painfull with end range hip flexion      Strength   Strength Assessment Site Hip;Knee    Right/Left Hip Left;Right    Right Hip Flexion 4+/5    Right Hip ABduction 4+/5    Right Hip ADduction 5/5    Left Hip Flexion 4+/5    Left Hip ABduction 4+/5    Left Hip ADduction 5/5    Right/Left Knee Right;Left    Right Knee Flexion 5/5    Right Knee Extension 5/5    Left Knee Flexion 5/5    Left Knee Extension 5/5      Palpation   Palpation comment spasming and tenderness to palpation in the lumbar spine      Ambulation/Gait   Gait Comments decreased hip flexion bilateral                        Objective measurements completed on examination: See above findings.       [facility] Adult PT Treatment/Exercise - 11/27/20 0001       Lumbar Exercises: Stretches   Lower Trunk Rotation Limitations x20    Piriformis Stretch Limitations 3x20 sec hold      Manual Therapy   Manual Therapy Soft tissue mobilization;Manual Traction    Soft tissue mobilization to lumbar spine    Manual Traction garde II and III LAD  PT Education - 11/26/20 1524     Education Details HEP and symptom mangement    Person(s) Educated Patient    Methods Explanation;Demonstration;Tactile cues;Verbal cues    Comprehension Verbalized understanding;Returned demonstration;Verbal cues required;Tactile cues required              PT Short Term Goals - 11/27/20 0926       PT SHORT TERM GOAL #1   Title Patient will be indepednent with basic HEP    Time 3    Period Weeks    Status New     Target Date 12/18/20      PT SHORT TERM GOAL #2   Title Patient will increase bilateral LE strength to 5/5    Time 3    Period Weeks    Status New    Target Date 12/18/20      PT SHORT TERM GOAL #3   Title Patient will sit for 30 minutes without haveing to shift off the left side    Time 3    Period Weeks    Status New    Target Date 12/18/20               PT Long Term Goals - 11/27/20 0928       PT LONG TERM GOAL #1   Title Patient will perform ADL's and IADL's without pain in his back    Time 6    Period Weeks    Status New    Target Date 01/08/21      PT LONG TERM GOAL #2   Title Patient will ambualte 3000' without increased pain    Time 6    Period Weeks    Status New    Target Date 01/08/21      PT LONG TERM GOAL #3   Title He will be able to be on feet for 30-45 min before pain starts    Time 6    Period Weeks    Status New    Target Date 01/08/21                    Plan - 11/27/20 0913     Clinical Impression Statement Patient is a 70 year old male with increased lower back pain following a hernia repair on 10/16/2020. He has bilateral pian with the pain on the right being more int he gluteal and on the left more into his ischial area. he has pain sitting. He has a kyphotic posture, but he has been able to maintoain his posture since his last bought of physical therapy. He has mild wekaness in hips and significant weakness in his core 2nd to 8 abdomnal surgeries. His ostomy area was leaking duing his last round of therapy. That has been repaired. Patient would benefit from skilled therapy to improve ability to perfrom daily tasks without pain. At this time he has increasd pain with standing and walking.    Personal Factors and Comorbidities Comorbidity 3+    Comorbidities anxiety, hernia repair,    Examination-Activity Limitations Stand;Stairs;Locomotion Level;Transfers    Examination-Participation Restrictions Community  Activity;Shop;Occupation    Stability/Clinical Decision Making Evolving/Moderate complexity    Clinical Decision Making Moderate    Rehab Potential Good    PT Frequency 2x / week    PT Duration 6 weeks    PT Treatment/Interventions ADLs/Self Care Home Management;Electrical Stimulation;Iontophoresis '4mg'$ /ml Dexamethasone;DME Instruction;Cryotherapy;Moist Heat;Gait training;Stair training;Functional mobility training;Therapeutic activities;Therapeutic exercise;Neuromuscular re-education;Patient/family education;Manual techniques;Passive range of motion;Taping    PT  Next Visit Plan manual therapy to lumbar spine;review HEP; review table core exercises.    PT Home Exercise Plan ccess Code: [email or web address]  URL: [email or web address]  Date: 11/27/2020  Prepared by: Carolyne Littles    Exercises  Supine Piriformis Stretch with Foot on Ground - 1 x daily - 7 x weekly - 1 sets - 3 reps - 20 hold  Supine Lower Trunk Rotation - 1 x daily - 7 x weekly - 3 sets - 10 reps  Shoulder extension with resistance - Neutral - 1 x daily - 7 x weekly - 3 sets - 10 reps  Scapular Retraction with Resistance - 1 x daily - 7 x weekly - 3 sets - 10 reps    Consulted and Agree with Plan of Care Patient             Patient will benefit from skilled therapeutic intervention in order to improve the following deficits and impairments:  Abnormal gait, Pain, Decreased strength, Decreased mobility, Decreased activity tolerance, Increased muscle spasms, Increased fascial restricitons, Decreased range of motion  Visit Diagnosis: Chronic bilateral low back pain without sciatica  Pain in thoracic spine  Cramp and spasm  Abnormal posture     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   Bilateral inguinal hernia 09/28/2018   Incisional hernia 03/17/2018   Trigger thumb, left thumb 01/19/2018   Trigger finger,  left index finger 07/18/2017   Trigger finger, right ring finger 01/31/2017   Trigger finger of left thumb 01/03/2017   Trigger finger of right thumb 01/03/2017   Trigger index finger of left hand 01/03/2017   Trigger index finger of right hand 01/03/2017   Malnutrition of moderate degree 07/19/2016   Intra-abdominal abscess ([place]) 07/16/2016   Cellulitis 07/15/2016   S/P colostomy takedown 06/28/2016   Pain in thoracic spine 06/09/2016   Mid back pain 06/09/2016   Postoperative fever 11/19/2015   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   Severe protein-calorie malnutrition ([place]) 11/17/2015   Sepsis ([place]) 11/14/2015   Hiccups 11/14/2015   AKI (acute kidney injury) ([place]) 11/14/2015   Fever    Leg swelling    Left shoulder pain    Muscle spasm of left shoulder    GI bleed 10/27/2015   Acute blood loss anemia 10/27/2015   Syncope 10/27/2015   Hyperglycemia 10/27/2015   Hypotension 10/27/2015   Neck pain 10/27/2015   Hematemesis 10/27/2015   Hematochezia 10/27/2015   Diverticulitis of colon with perforation 07/05/2015    Carney Living PT DPT  11/27/2020, 1:57 PM  [facility] [address] [place], [name], [address] Phone: [phone]   Fax:  [phone]  Name: Miguel [name] MRN: [id number] Date of Birth: [birth year]

## 2020-11-28 ENCOUNTER — Other Ambulatory Visit: Payer: Self-pay

## 2020-11-28 ENCOUNTER — Ambulatory Visit (HOSPITAL_BASED_OUTPATIENT_CLINIC_OR_DEPARTMENT_OTHER): Payer: Medicare Other | Attending: Orthopaedic Surgery | Admitting: Physical Therapy

## 2020-11-28 ENCOUNTER — Encounter (HOSPITAL_BASED_OUTPATIENT_CLINIC_OR_DEPARTMENT_OTHER): Payer: Self-pay | Admitting: Physical Therapy

## 2020-11-28 DIAGNOSIS — M545 Low back pain, unspecified: Secondary | ICD-10-CM | POA: Diagnosis not present

## 2020-11-28 DIAGNOSIS — M546 Pain in thoracic spine: Secondary | ICD-10-CM | POA: Diagnosis not present

## 2020-11-28 DIAGNOSIS — G8929 Other chronic pain: Secondary | ICD-10-CM | POA: Insufficient documentation

## 2020-11-28 DIAGNOSIS — R293 Abnormal posture: Secondary | ICD-10-CM | POA: Diagnosis not present

## 2020-11-28 DIAGNOSIS — R252 Cramp and spasm: Secondary | ICD-10-CM | POA: Diagnosis not present

## 2020-12-01 ENCOUNTER — Encounter (HOSPITAL_BASED_OUTPATIENT_CLINIC_OR_DEPARTMENT_OTHER): Payer: Self-pay | Admitting: Physical Therapy

## 2020-12-01 NOTE — Therapy (Signed)
[facility] [address] [place], [name], [address] Phone: [phone]   Fax:  [phone]  Physical Therapy Treatment  Patient Details  Name: Miguel [name] MRN: [id number] Date of Birth: 08/24/1950 Referring Provider (PT): Dr Jean Rosenthal   Encounter Date: 11/28/2020    Past Medical History:  Diagnosis Date   Anemia    Anxiety    BPH (benign prostatic hyperplasia)    Gastritis    GERD (gastroesophageal reflux disease)    "seldom" (07/16/2016)   GI bleed due to NSAIDs 10/27/2015   History of blood transfusion 06/2016   post OR/notes 07/15/2016   History of hiatal hernia    History of kidney stones    surgery to remove stone   Hyperlipidemia    Hypertension    no meds    Melanoma of back ([place])    "mid back"   Sigmoid diverticulitis    with perforation    Past Surgical History:  Procedure Laterality Date   COLON SURGERY     sigmoid   COLOSTOMY TAKEDOWN N/A 06/28/2016   Procedure: COLOSTOMY TAKEDOWN;  Surgeon: [name], MD;  Location: [facility];  Service: General;  Laterality: N/A;   CYSTOSCOPY WITH RETROGRADE PYELOGRAM, URETEROSCOPY AND STENT PLACEMENT Bilateral 02/12/2020   Procedure: CYSTOSCOPY WITH BILATERAL RETROGRADE PYELOGRAM,  AND LITHOPEXY;  Surgeon: Remi Haggard, MD;  Location: [facility];  Service: Urology;  Laterality: Bilateral;  1 HR   ESOPHAGOGASTRODUODENOSCOPY N/A 10/27/2015   Procedure: ESOPHAGOGASTRODUODENOSCOPY (EGD);  Surgeon: Clarene Essex, MD;  Location: [facility] ENDOSCOPY;  Service: Endoscopy;  Laterality: N/A;   ESOPHAGOGASTRODUODENOSCOPY N/A 10/29/2015   Procedure: ESOPHAGOGASTRODUODENOSCOPY (EGD);  Surgeon: Ronald Lobo, MD;  Location: [facility]OSCOPY;  Service: Endoscopy;  Laterality: N/A;   ESOPHAGOGASTRODUODENOSCOPY N/A 10/30/2015   Procedure: ESOPHAGOGASTRODUODENOSCOPY (EGD);  Surgeon: Clarene Essex, MD;  Location: [facility] ENDOSCOPY;  Service: Endoscopy;  Laterality: N/A;   g tube discontinued   04/2020   per patient   [place]  11/21/2015   REDUCTION OF HIATAL HERNIA , REPAIR HIATAL HERNIA, RESECTION SMALL BOWEL WITH ANASTOMOSIS, PLACEMENT GASTROSTOMY TUBE, PLACEMENT DUODENOSTOMY TUBE (N/A)   HEMORRHOID BANDING  X 2   HERNIA REPAIR     HIATAL HERNIA REPAIR N/A 11/21/2015   Procedure: REDUCTION OF HIATAL HERNIA , REPAIR HIATAL HERNIA, RESECTION SMALL BOWEL WITH ANASTOMOSIS, PLACEMENT GASTROSTOMY TUBE, PLACEMENT DUODENOSTOMY TUBE;  Surgeon: Mickeal Skinner, MD;  Location: [facility];  Service: General;  Laterality: N/A;   HOLMIUM LASER APPLICATION Right [id number]   Procedure: HOLMIUM LASER APPLICATION;  Surgeon: Remi Haggard, MD;  [facility];  Service: Urology;  Laterality: Right;   INCISIONAL HERNIA REPAIR  06/28/2016   open/notes 07/15/2016   INCISIONAL HERNIA REPAIR  03/17/2018   WITH MESH   INCISIONAL HERNIA REPAIR N/A 03/17/2018   Procedure: INCISIONAL HERNIA REPAIR WITH MESH;  Surgeon: [name], MD;  Location: [facility];  Service: General;  Laterality: N/A;   [place] N/A 10/16/2020   Procedure: Fatima Blank HERNIA REPAIR WITH MESH;  Surgeon: [name], MD;  Location: [facility];  Service: General;  Laterality: N/A;   INGUINAL HERNIA REPAIR Bilateral 09/28/2018   INGUINAL HERNIA REPAIR Bilateral 09/28/2018   Procedure: BILATERAL OPEN INGUINAL HERNIA REPAIR WITH MESH;  Surgeon: [name], MD;  Location: [facility];  Service: General;  Laterality: Bilateral;  GENERAL AND TAP BLOCK   INSERTION OF MESH N/A 03/17/2018   Procedure: INSERTION OF MESH;  Surgeon: [name], MD;  Location: [facility];  Service: General;  Laterality: N/A;   INSERTION OF MESH Bilateral 09/28/2018   Procedure: Insertion Of Mesh;  Surgeon: [name], MD;  Location: [facility];  Service: General;  Laterality: Bilateral;   IR CM INJ ANY COLONIC TUBE W/FLUORO  02/04/2017   IR GUIDED DRAIN W CATHETER PLACEMENT  07/06/2016   /NOTES 07/15/2016   IR PATIENT  EVAL TECH 0-60 MINS  06/28/2019   IR RADIOLOGIST EVAL & MGMT  07/27/2016   IR RADIOLOGIST EVAL & MGMT  08/17/2016   IR RADIOLOGIST EVAL & MGMT  08/26/2016   IR REPLACE G-TUBE SIMPLE WO FLUORO  07/28/2017   IR REPLACE G-TUBE SIMPLE WO FLUORO  01/31/2018   IR REPLACE G-TUBE SIMPLE WO FLUORO  10/16/2018   IR REPLACE G-TUBE SIMPLE WO FLUORO  03/05/2019   IR REPLACE G-TUBE SIMPLE WO FLUORO  08/22/2019   IR REPLACE G-TUBE SIMPLE WO FLUORO  01/08/2020   IR [place] GASTRO/COLONIC TUBE PERCUT W/FLUORO  08/04/2016   IR [place] GASTRO/COLONIC TUBE PERCUT W/FLUORO  01/14/2017   IR US GUIDE BX ASP/DRAIN  08/04/2016   KNEE CARTILAGE SURGERY Right 1971   "opened me up"   LAPAROTOMY N/A 07/05/2015   Procedure: PARTIAL SIGMOID COLECTOMY AND COLOSTOMY;  Surgeon: [name], MD;  Location: [facility];  Service: General;  Laterality: N/A;   MELANOMA EXCISION  2001   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  10/30/2015   Procedure: REMOVAL OF DISTAL STOMACH;  Surgeon: Judeth Horn, MD;  Location: [facility];  Service: General;;   REPAIR OF PERFORATED ULCER N/A 10/30/2015   Procedure: REPAIR OF BLEEDING  ULCER;  Surgeon: Judeth Horn, MD;  Location: [facility];  Service: General;  Laterality: N/A;   TUMOR EXCISION  2009   "back; fatty tumor"    There were no vitals filed for this visit.   Subjective Assessment - 12/01/20 1352     Subjective Patient reports his back was pretty good this morning,.It alsoways feels better in the morning. It is hurting now.    Pertinent History extensive abdominal surgerys ( 8 surgeies total)    Limitations Standing;Walking    How long can you stand comfortably? pain increased with standing time    How long can you walk comfortably? limited distances    Diagnostic tests nothing sicne the operation    Patient Stated Goals to have less pain    Currently in Pain? Yes    Pain Score 3     Pain Location Back    Pain Orientation Right;Left    Pain Descriptors / Indicators Aching    Pain Type  Chronic pain    Pain Onset More than a month ago    Pain Frequency Constant    Aggravating Factors  standing and walking    Effect of Pain on Daily Activities difficulty perfroming ADL's                               [facility] Adult PT Treatment/Exercise - 12/01/20 0001       Ambulation/Gait   Gait Comments decreased hip flexion bilateral      Posture/Postural Control   Posture Comments kyphotic posture      Lumbar Exercises: Stretches   Lower Trunk Rotation Limitations x20    Piriformis Stretch Limitations 3x20 sec hold      Lumbar Exercises: Standing   Other Standing Lumbar Exercises sxap retraction x20 red; shoulder extension x20 red  Lumbar Exercises: Supine   Other Supine Lumbar Exercises clam shell x20 red; supine march x20 red; bridge x20 red      Manual Therapy   Manual Therapy Soft tissue mobilization;Manual Traction    Soft tissue mobilization to lumbar spine    Manual Traction garde II and III LAD                      PT Short Term Goals - 11/27/20 0926       PT SHORT TERM GOAL #1   Title Patient will be indepednent with basic HEP    Time 3    Period Weeks    Status New    Target Date 12/18/20      PT SHORT TERM GOAL #2   Title Patient will increase bilateral LE strength to 5/5    Time 3    Period Weeks    Status New    Target Date 12/18/20      PT SHORT TERM GOAL #3   Title Patient will sit for 30 minutes without haveing to shift off the left side    Time 3    Period Weeks    Status New    Target Date 12/18/20               PT Long Term Goals - 11/27/20 0928       PT LONG TERM GOAL #1   Title Patient will perform ADL's and IADL's without pain in his back    Time 6    Period Weeks    Status New    Target Date 01/08/21      PT LONG TERM GOAL #2   Title Patient will ambualte 3000' without increased pain    Time 6    Period Weeks    Status New    Target Date 01/08/21      PT LONG TERM GOAL #3    Title He will be able to be on feet for 30-45 min before pain starts    Time 6    Period Weeks    Status New    Target Date 01/08/21                   Plan - 12/01/20 1353     Clinical Impression Statement Patient had significant spasming on the left upper lumbar spine. He tolerated treatment well and reported improved pain. Therapy reviewed baseexercises with the patient to begin perfroming at home. Therapy will continue to advance as tolerated.    Personal Factors and Comorbidities Comorbidity 3+    Comorbidities anxiety, hernia repair,    Examination-Activity Limitations Stand;Stairs;Locomotion Level;Transfers    Stability/Clinical Decision Making Evolving/Moderate complexity    Clinical Decision Making Moderate    Rehab Potential Good    PT Frequency 2x / week    PT Treatment/Interventions ADLs/Self Care Home Management;Electrical Stimulation;Iontophoresis '4mg'$ /ml Dexamethasone;DME Instruction;Cryotherapy;Moist Heat;Gait training;Stair training;Functional mobility training;Therapeutic activities;Therapeutic exercise;Neuromuscular re-education;Patient/family education;Manual techniques;Passive range of motion;Taping    PT Next Visit Plan manual therapy to lumbar spine;review HEP; review table core exercises.    PT Home Exercise Plan ccess Code: [email or web address]  URL: [email or web address]  Date: 11/27/2020  Prepared by: Carolyne Littles    Exercises  Supine Piriformis Stretch with Foot on Ground - 1 x daily - 7 x weekly - 1 sets - 3 reps - 20 hold  Supine Lower Trunk Rotation - 1 x daily - 7 x weekly - 3 sets - 10 reps  Shoulder extension with resistance - Neutral - 1 x daily - 7 x weekly - 3 sets - 10 reps  Scapular Retraction with Resistance - 1 x daily - 7 x weekly - 3 sets - 10 reps    Consulted and Agree with Plan of Care Patient             Patient will benefit from skilled therapeutic intervention in order to improve the following deficits and impairments:  Abnormal  gait, Pain, Decreased strength, Decreased mobility, Decreased activity tolerance, Increased muscle spasms, Increased fascial restricitons, Decreased range of motion  Visit Diagnosis: Chronic bilateral low back pain without sciatica  Pain in thoracic spine  Cramp and spasm  Abnormal posture     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   Bilateral inguinal hernia 09/28/2018   Incisional hernia 03/17/2018   Trigger thumb, left thumb 01/19/2018   Trigger finger, left index finger 07/18/2017   Trigger finger, right ring finger 01/31/2017   Trigger finger of left thumb 01/03/2017   Trigger finger of right thumb 01/03/2017   Trigger index finger of left hand 01/03/2017   Trigger index finger of right hand 01/03/2017   Malnutrition of moderate degree 07/19/2016   Intra-abdominal abscess ([place]) 07/16/2016   Cellulitis 07/15/2016   S/P colostomy takedown 06/28/2016   Pain in thoracic spine 06/09/2016   Mid back pain 06/09/2016   Postoperative fever 11/19/2015   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   Severe protein-calorie malnutrition ([place]) 11/17/2015   Sepsis ([place]) 11/14/2015   Hiccups 11/14/2015   AKI (acute kidney injury) ([place]) 11/14/2015   Fever    Leg swelling    Left shoulder pain    Muscle spasm of left shoulder    GI bleed 10/27/2015   Acute blood loss anemia 10/27/2015   Syncope 10/27/2015   Hyperglycemia 10/27/2015   Hypotension 10/27/2015   Neck pain 10/27/2015   Hematemesis 10/27/2015   Hematochezia 10/27/2015   Diverticulitis of colon with perforation 07/05/2015    Carney Living PT DPT  12/01/2020, 1:56 PM  [facility] [address] [place], [name], [address] Phone: [phone]   Fax:  [phone]  Name: Miguel [name] MRN: [id number] Date of Birth: Apr 07, 1950

## 2020-12-03 ENCOUNTER — Encounter (HOSPITAL_BASED_OUTPATIENT_CLINIC_OR_DEPARTMENT_OTHER): Payer: Self-pay | Admitting: Physical Therapy

## 2020-12-03 ENCOUNTER — Ambulatory Visit (HOSPITAL_BASED_OUTPATIENT_CLINIC_OR_DEPARTMENT_OTHER): Payer: Medicare Other | Admitting: Physical Therapy

## 2020-12-03 ENCOUNTER — Other Ambulatory Visit: Payer: Self-pay

## 2020-12-03 DIAGNOSIS — M546 Pain in thoracic spine: Secondary | ICD-10-CM | POA: Diagnosis not present

## 2020-12-03 DIAGNOSIS — R252 Cramp and spasm: Secondary | ICD-10-CM

## 2020-12-03 DIAGNOSIS — U071 COVID-19: Secondary | ICD-10-CM | POA: Diagnosis not present

## 2020-12-03 DIAGNOSIS — M545 Low back pain, unspecified: Secondary | ICD-10-CM | POA: Diagnosis not present

## 2020-12-03 DIAGNOSIS — G8929 Other chronic pain: Secondary | ICD-10-CM | POA: Diagnosis not present

## 2020-12-03 DIAGNOSIS — R293 Abnormal posture: Secondary | ICD-10-CM | POA: Diagnosis not present

## 2020-12-04 ENCOUNTER — Encounter (HOSPITAL_BASED_OUTPATIENT_CLINIC_OR_DEPARTMENT_OTHER): Payer: Self-pay | Admitting: Physical Therapy

## 2020-12-04 NOTE — Therapy (Signed)
[facility] [address] [place], [name], [address] Phone: [phone]   Fax:  [phone]  Physical Therapy Treatment  Patient Details  Name: Miguel [name] MRN: [id number] Date of Birth: 1950-05-10 Referring Provider (PT): Dr Jean Rosenthal   Encounter Date: 12/03/2020    Past Medical History:  Diagnosis Date   Anemia    Anxiety    BPH (benign prostatic hyperplasia)    Gastritis    GERD (gastroesophageal reflux disease)    "seldom" (07/16/2016)   GI bleed due to NSAIDs 10/27/2015   History of blood transfusion 06/2016   post OR/notes 07/15/2016   History of hiatal hernia    History of kidney stones    surgery to remove stone   Hyperlipidemia    Hypertension    no meds    Melanoma of back ([place])    "mid back"   Sigmoid diverticulitis    with perforation    Past Surgical History:  Procedure Laterality Date   COLON SURGERY     sigmoid   COLOSTOMY TAKEDOWN N/A 06/28/2016   Procedure: COLOSTOMY TAKEDOWN;  Surgeon: [name], MD;  Location: [facility];  Service: General;  Laterality: N/A;   CYSTOSCOPY WITH RETROGRADE PYELOGRAM, URETEROSCOPY AND STENT PLACEMENT Bilateral 02/12/2020   Procedure: CYSTOSCOPY WITH BILATERAL RETROGRADE PYELOGRAM,  AND LITHOPEXY;  Surgeon: Remi Haggard, MD;  [facility];  Service: Urology;  Laterality: Bilateral;  1 HR   ESOPHAGOGASTRODUODENOSCOPY N/A 10/27/2015   Procedure: ESOPHAGOGASTRODUODENOSCOPY (EGD);  Surgeon: Clarene Essex, MD;  Location: [facility] ENDOSCOPY;  Service: Endoscopy;  Laterality: N/A;   ESOPHAGOGASTRODUODENOSCOPY N/A 10/29/2015   Procedure: ESOPHAGOGASTRODUODENOSCOPY (EGD);  Surgeon: Ronald Lobo, MD;  Location: [facility] ENDOSCOPY;  Service: Endoscopy;  Laterality: N/A;   ESOPHAGOGASTRODUODENOSCOPY N/A 10/30/2015   Procedure: ESOPHAGOGASTRODUODENOSCOPY (EGD);  Surgeon: Clarene Essex, MD;  Location: [facility] ENDOSCOPY;  Service: Endoscopy;  Laterality: N/A;   g tube discontinued   04/2020   per patient   [place]  11/21/2015   REDUCTION OF HIATAL HERNIA , REPAIR HIATAL HERNIA, RESECTION SMALL BOWEL WITH ANASTOMOSIS, PLACEMENT GASTROSTOMY TUBE, PLACEMENT DUODENOSTOMY TUBE (N/A)   HEMORRHOID BANDING  X 2   HERNIA REPAIR     HIATAL HERNIA REPAIR N/A 11/21/2015   Procedure: REDUCTION OF HIATAL HERNIA , REPAIR HIATAL HERNIA, RESECTION SMALL BOWEL WITH ANASTOMOSIS, PLACEMENT GASTROSTOMY TUBE, PLACEMENT DUODENOSTOMY TUBE;  Surgeon: Mickeal Skinner, MD;  Location: [facility];  Service: General;  Laterality: N/A;   HOLMIUM LASER APPLICATION Right [id number]   Procedure: HOLMIUM LASER APPLICATION;  Surgeon: Remi Haggard, MD;  [facility];  Service: Urology;  Laterality: Right;   INCISIONAL HERNIA REPAIR  06/28/2016   open/notes 07/15/2016   INCISIONAL HERNIA REPAIR  03/17/2018   WITH MESH   INCISIONAL HERNIA REPAIR N/A 03/17/2018   Procedure: INCISIONAL HERNIA REPAIR WITH MESH;  Surgeon: [name], MD;  Location: [facility];  Service: General;  Laterality: N/A;   [place] N/A 10/16/2020   Procedure: Fatima Blank HERNIA REPAIR WITH MESH;  Surgeon: [name], MD;  Location: [facility];  Service: General;  Laterality: N/A;   INGUINAL HERNIA REPAIR Bilateral 09/28/2018   INGUINAL HERNIA REPAIR Bilateral 09/28/2018   Procedure: BILATERAL OPEN INGUINAL HERNIA REPAIR WITH MESH;  Surgeon: [name], MD;  Location: [facility];  Service: General;  Laterality: Bilateral;  GENERAL AND TAP BLOCK   INSERTION OF MESH N/A 03/17/2018   Procedure: INSERTION OF MESH;  Surgeon: [name], MD;  Location: [facility];  Service: General;  Laterality: N/A;   INSERTION OF MESH Bilateral 09/28/2018   Procedure: Insertion Of Mesh;  Surgeon: [name], MD;  Location: [facility];  Service: General;  Laterality: Bilateral;   IR CM INJ ANY COLONIC TUBE W/FLUORO  02/04/2017   IR GUIDED DRAIN W CATHETER PLACEMENT  07/06/2016   /NOTES 07/15/2016   IR PATIENT  EVAL TECH 0-60 MINS  06/28/2019   IR RADIOLOGIST EVAL & MGMT  07/27/2016   IR RADIOLOGIST EVAL & MGMT  08/17/2016   IR RADIOLOGIST EVAL & MGMT  08/26/2016   IR REPLACE G-TUBE SIMPLE WO FLUORO  07/28/2017   IR REPLACE G-TUBE SIMPLE WO FLUORO  01/31/2018   IR REPLACE G-TUBE SIMPLE WO FLUORO  10/16/2018   IR REPLACE G-TUBE SIMPLE WO FLUORO  03/05/2019   IR REPLACE G-TUBE SIMPLE WO FLUORO  08/22/2019   IR REPLACE G-TUBE SIMPLE WO FLUORO  01/08/2020   IR [place] GASTRO/COLONIC TUBE PERCUT W/FLUORO  08/04/2016   IR [place] GASTRO/COLONIC TUBE PERCUT W/FLUORO  01/14/2017   IR US GUIDE BX ASP/DRAIN  08/04/2016   KNEE CARTILAGE SURGERY Right 1971   "opened me up"   LAPAROTOMY N/A 07/05/2015   Procedure: PARTIAL SIGMOID COLECTOMY AND COLOSTOMY;  Surgeon: [name], MD;  Location: [facility];  Service: General;  Laterality: N/A;   MELANOMA EXCISION  2001   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  10/30/2015   Procedure: REMOVAL OF DISTAL STOMACH;  Surgeon: Judeth Horn, MD;  Location: [facility];  Service: General;;   REPAIR OF PERFORATED ULCER N/A 10/30/2015   Procedure: REPAIR OF BLEEDING  ULCER;  Surgeon: Judeth Horn, MD;  Location: [facility];  Service: General;  Laterality: N/A;   TUMOR EXCISION  2009   "back; fatty tumor"    There were no vitals filed for this visit.   Subjective Assessment - 12/04/20 2008     Subjective Pateint reports he got sore after the last visit. He is doing better now though. He is having some pain in his stomach and lwoer back today.    Pertinent History extensive abdominal surgerys ( 8 surgeies total)    Limitations Standing;Walking    How long can you stand comfortably? pain increased with standing time    How long can you walk comfortably? limited distances    Diagnostic tests nothing sicne the operation    Patient Stated Goals to have less pain    Currently in Pain? Yes    Pain Score 5     Pain Location Back    Pain Orientation Right;Left    Pain Descriptors / Indicators  Aching    Pain Type Chronic pain    Pain Onset More than a month ago    Pain Frequency Constant    Aggravating Factors  stading and walking    Pain Relieving Factors rest    Effect of Pain on Daily Activities difficulty perfroming ADL;s                               [facility] Adult PT Treatment/Exercise - 12/04/20 0001       Ambulation/Gait   Gait Comments decreased hip flexion bilateral      Posture/Postural Control   Posture Comments kyphotic posture      Lumbar Exercises: Stretches   Lower Trunk Rotation Limitations x20    Piriformis Stretch Limitations 3x20 sec hold      Lumbar Exercises: Standing  Other Standing Lumbar Exercises scap retraction x20 red; shoulder extension x20 red      Lumbar Exercises: Seated   Other Seated Lumbar Exercises bilateral er red 2x10; horizontal abdcution 2x10 red; shoulder flexion 2x10 red      Lumbar Exercises: Supine   Other Supine Lumbar Exercises clam shell x20 red; supine march x20 red; bridge x20 red      Manual Therapy   Manual Therapy Soft tissue mobilization;Manual Traction    Soft tissue mobilization to lumbar spine    Manual Traction garde II and III LAD                       PT Short Term Goals - 11/27/20 0926       PT SHORT TERM GOAL #1   Title Patient will be indepednent with basic HEP    Time 3    Period Weeks    Status New    Target Date 12/18/20      PT SHORT TERM GOAL #2   Title Patient will increase bilateral LE strength to 5/5    Time 3    Period Weeks    Status New    Target Date 12/18/20      PT SHORT TERM GOAL #3   Title Patient will sit for 30 minutes without haveing to shift off the left side    Time 3    Period Weeks    Status New    Target Date 12/18/20               PT Long Term Goals - 11/27/20 0928       PT LONG TERM GOAL #1   Title Patient will perform ADL's and IADL's without pain in his back    Time 6    Period Weeks    Status New    Target  Date 01/08/21      PT LONG TERM GOAL #2   Title Patient will ambualte 3000' without increased pain    Time 6    Period Weeks    Status New    Target Date 01/08/21      PT LONG TERM GOAL #3   Title He will be able to be on feet for 30-45 min before pain starts    Time 6    Period Weeks    Status New    Target Date 01/08/21                   Plan - 12/04/20 2003     Clinical Impression Statement Patient had less spasming in the upper lumbar spine today. his spasming was mostly located int he gluteals. Therapy focused on manual therapy to the gluteals. Therapy also reviewed UE exercises to perfrom at home. Therapy will continue to progress as tolerated. We will continue to hold on dry needling until he is outside of his surgical window.    Personal Factors and Comorbidities Comorbidity 3+    Comorbidities anxiety, hernia repair,    Examination-Activity Limitations Stand;Stairs;Locomotion Level;Transfers    Examination-Participation Restrictions Community Activity;Shop;Occupation    Stability/Clinical Decision Making Evolving/Moderate complexity    Clinical Decision Making Moderate    Rehab Potential Good    PT Frequency 2x / week    PT Duration 6 weeks    PT Treatment/Interventions ADLs/Self Care Home Management;Electrical Stimulation;Iontophoresis '4mg'$ /ml Dexamethasone;DME Instruction;Cryotherapy;Moist Heat;Gait training;Stair training;Functional mobility training;Therapeutic activities;Therapeutic exercise;Neuromuscular re-education;Patient/family education;Manual techniques;Passive range of motion;Taping    PT Next Visit  Plan manual therapy to lumbar spine;review HEP; review table core exercises.    PT Home Exercise Plan ccess Code: [email or web address]  URL: [email or web address]  Date: 11/27/2020  Prepared by: Carolyne Littles    Exercises  Supine Piriformis Stretch with Foot on Ground - 1 x daily - 7 x weekly - 1 sets - 3 reps - 20 hold  Supine Lower Trunk Rotation - 1 x  daily - 7 x weekly - 3 sets - 10 reps  Shoulder extension with resistance - Neutral - 1 x daily - 7 x weekly - 3 sets - 10 reps  Scapular Retraction with Resistance - 1 x daily - 7 x weekly - 3 sets - 10 reps    Consulted and Agree with Plan of Care Patient             Patient will benefit from skilled therapeutic intervention in order to improve the following deficits and impairments:  Abnormal gait, Pain, Decreased strength, Decreased mobility, Decreased activity tolerance, Increased muscle spasms, Increased fascial restricitons, Decreased range of motion  Visit Diagnosis: Chronic bilateral low back pain without sciatica  Pain in thoracic spine  Cramp and spasm  Abnormal posture     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   Bilateral inguinal hernia 09/28/2018   Incisional hernia 03/17/2018   Trigger thumb, left thumb 01/19/2018   Trigger finger, left index finger 07/18/2017   Trigger finger, right ring finger 01/31/2017   Trigger finger of left thumb 01/03/2017   Trigger finger of right thumb 01/03/2017   Trigger index finger of left hand 01/03/2017   Trigger index finger of right hand 01/03/2017   Malnutrition of moderate degree 07/19/2016   Intra-abdominal abscess ([place]) 07/16/2016   Cellulitis 07/15/2016   S/P colostomy takedown 06/28/2016   Pain in thoracic spine 06/09/2016   Mid back pain 06/09/2016   Postoperative fever 11/19/2015   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   Severe protein-calorie malnutrition ([place]) 11/17/2015   Sepsis ([place]) 11/14/2015   Hiccups 11/14/2015   AKI (acute kidney injury) ([place]) 11/14/2015   Fever    Leg swelling    Left shoulder pain    Muscle spasm of left shoulder    GI bleed 10/27/2015   Acute blood loss anemia 10/27/2015   Syncope 10/27/2015   Hyperglycemia 10/27/2015   Hypotension 10/27/2015   Neck pain 10/27/2015    Hematemesis 10/27/2015   Hematochezia 10/27/2015   Diverticulitis of colon with perforation 07/05/2015    Carney Living, PT 12/04/2020, 8:09 PM  [facility] [address] [place], [name], [address] Phone: [phone]   Fax:  [phone]  Name: Miguel [name] MRN: [id number] Date of Birth: 1951/03/12

## 2020-12-08 ENCOUNTER — Ambulatory Visit (HOSPITAL_BASED_OUTPATIENT_CLINIC_OR_DEPARTMENT_OTHER): Payer: Medicare Other | Admitting: Physical Therapy

## 2020-12-09 ENCOUNTER — Ambulatory Visit (HOSPITAL_BASED_OUTPATIENT_CLINIC_OR_DEPARTMENT_OTHER): Payer: Medicare Other | Admitting: Physical Therapy

## 2020-12-09 ENCOUNTER — Encounter (HOSPITAL_BASED_OUTPATIENT_CLINIC_OR_DEPARTMENT_OTHER): Payer: Self-pay | Admitting: Physical Therapy

## 2020-12-09 ENCOUNTER — Other Ambulatory Visit: Payer: Self-pay

## 2020-12-09 DIAGNOSIS — R252 Cramp and spasm: Secondary | ICD-10-CM

## 2020-12-09 DIAGNOSIS — M546 Pain in thoracic spine: Secondary | ICD-10-CM

## 2020-12-09 DIAGNOSIS — M545 Low back pain, unspecified: Secondary | ICD-10-CM | POA: Diagnosis not present

## 2020-12-09 DIAGNOSIS — R293 Abnormal posture: Secondary | ICD-10-CM | POA: Diagnosis not present

## 2020-12-09 DIAGNOSIS — G8929 Other chronic pain: Secondary | ICD-10-CM | POA: Diagnosis not present

## 2020-12-10 NOTE — Therapy (Signed)
[facility] [address] [place], [name], [address] Phone: [phone]   Fax:  [phone]  Physical Therapy Treatment  Patient Details  Name: Miguel [name] MRN: [id number] Date of Birth: 1951/02/15 Referring Provider (PT): Dr Jean Rosenthal   Encounter Date: 12/09/2020   PT End of Session - 12/09/20 1546     Visit Number 4    Number of Visits 12    Date for PT Re-Evaluation 01/07/21    Authorization Type MCR A and B    PT Start Time [id number]    PT Stop Time 1558    PT Time Calculation (min) 43 min    Activity Tolerance Patient tolerated treatment well    Behavior During Therapy [name] for tasks assessed/performed             Past Medical History:  Diagnosis Date   Anemia    Anxiety    BPH (benign prostatic hyperplasia)    Gastritis    GERD (gastroesophageal reflux disease)    "seldom" (07/16/2016)   GI bleed due to NSAIDs 10/27/2015   History of blood transfusion 06/2016   post OR/notes 07/15/2016   History of hiatal hernia    History of kidney stones    surgery to remove stone   Hyperlipidemia    Hypertension    no meds    Melanoma of back ([place])    "mid back"   Sigmoid diverticulitis    with perforation    Past Surgical History:  Procedure Laterality Date   COLON SURGERY     sigmoid   COLOSTOMY TAKEDOWN N/A 06/28/2016   Procedure: COLOSTOMY TAKEDOWN;  Surgeon: [name], MD;  Location: [facility];  Service: General;  Laterality: N/A;   CYSTOSCOPY WITH RETROGRADE PYELOGRAM, URETEROSCOPY AND STENT PLACEMENT Bilateral 02/12/2020   Procedure: CYSTOSCOPY WITH BILATERAL RETROGRADE PYELOGRAM,  AND LITHOPEXY;  Surgeon: Remi Haggard, MD;  Location: [facility];  Service: Urology;  Laterality: Bilateral;  1 HR   ESOPHAGOGASTRODUODENOSCOPY N/A 10/27/2015   Procedure: ESOPHAGOGASTRODUODENOSCOPY (EGD);  Surgeon: Clarene Essex, MD;  Location: [facility] ENDOSCOPY;  Service: Endoscopy;  Laterality: N/A;    ESOPHAGOGASTRODUODENOSCOPY N/A 10/29/2015   Procedure: ESOPHAGOGASTRODUODENOSCOPY (EGD);  Surgeon: Ronald Lobo, MD;  Location: [facility] ENDOSCOPY;  Service: Endoscopy;  Laterality: N/A;   ESOPHAGOGASTRODUODENOSCOPY N/A 10/30/2015   Procedure: ESOPHAGOGASTRODUODENOSCOPY (EGD);  Surgeon: Clarene Essex, MD;  Location: [facility] ENDOSCOPY;  Service: Endoscopy;  Laterality: N/A;   g tube discontinued  04/2020   per patient   [place]  11/21/2015   REDUCTION OF HIATAL HERNIA , REPAIR HIATAL HERNIA, RESECTION SMALL BOWEL WITH ANASTOMOSIS, PLACEMENT GASTROSTOMY TUBE, PLACEMENT DUODENOSTOMY TUBE (N/A)   HEMORRHOID BANDING  X 2   HERNIA REPAIR     HIATAL HERNIA REPAIR N/A 11/21/2015   Procedure: REDUCTION OF HIATAL HERNIA , REPAIR HIATAL HERNIA, RESECTION SMALL BOWEL WITH ANASTOMOSIS, PLACEMENT GASTROSTOMY TUBE, PLACEMENT DUODENOSTOMY TUBE;  Surgeon: Mickeal Skinner, MD;  Location: [place];  Service: General;  Laterality: N/A;   HOLMIUM LASER APPLICATION Right [id number]   Procedure: HOLMIUM LASER APPLICATION;  Surgeon: Remi Haggard, MD;  Location: [facility];  Service: Urology;  Laterality: Right;   INCISIONAL HERNIA REPAIR  06/28/2016   open/notes 07/15/2016   INCISIONAL HERNIA REPAIR  03/17/2018   WITH MESH   INCISIONAL HERNIA REPAIR N/A 03/17/2018   Procedure: INCISIONAL HERNIA REPAIR WITH MESH;  Surgeon: [name], MD;  Location: [place];  Service: General;  Laterality: N/A;   [place] N/A  10/16/2020   Procedure: INCISIONAL HERNIA REPAIR WITH MESH;  Surgeon: [name], MD;  Location: [place];  Service: General;  Laterality: N/A;   INGUINAL HERNIA REPAIR Bilateral 09/28/2018   INGUINAL HERNIA REPAIR Bilateral 09/28/2018   Procedure: BILATERAL OPEN INGUINAL HERNIA REPAIR WITH MESH;  Surgeon: [name], MD;  Location: [place];  Service: General;  Laterality: Bilateral;  GENERAL AND TAP BLOCK   INSERTION OF MESH N/A 03/17/2018   Procedure: INSERTION OF MESH;   Surgeon: [name], MD;  Location: [place];  Service: General;  Laterality: N/A;   INSERTION OF MESH Bilateral 09/28/2018   Procedure: Insertion Of Mesh;  Surgeon: [name], MD;  Location: [place];  Service: General;  Laterality: Bilateral;   IR CM INJ ANY COLONIC TUBE W/FLUORO  02/04/2017   IR GUIDED DRAIN W CATHETER PLACEMENT  07/06/2016   /NOTES 07/15/2016   IR PATIENT EVAL TECH 0-60 MINS  06/28/2019   IR RADIOLOGIST EVAL & MGMT  07/27/2016   IR RADIOLOGIST EVAL & MGMT  08/17/2016   IR RADIOLOGIST EVAL & MGMT  08/26/2016   IR REPLACE G-TUBE SIMPLE WO FLUORO  07/28/2017   IR REPLACE G-TUBE SIMPLE WO FLUORO  01/31/2018   IR REPLACE G-TUBE SIMPLE WO FLUORO  10/16/2018   IR REPLACE G-TUBE SIMPLE WO FLUORO  03/05/2019   IR REPLACE G-TUBE SIMPLE WO FLUORO  08/22/2019   IR REPLACE G-TUBE SIMPLE WO FLUORO  01/08/2020   IR [place] GASTRO/COLONIC TUBE PERCUT W/FLUORO  08/04/2016   IR [place] GASTRO/COLONIC TUBE PERCUT W/FLUORO  01/14/2017   IR US GUIDE BX ASP/DRAIN  08/04/2016   KNEE CARTILAGE SURGERY Right 1971   "opened me up"   LAPAROTOMY N/A 07/05/2015   Procedure: PARTIAL SIGMOID COLECTOMY AND COLOSTOMY;  Surgeon: [name], MD;  Location: [facility] OR;  Service: General;  Laterality: N/A;   MELANOMA EXCISION  2001   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   Subjective Assessment - 12/09/20 1542     Subjective Patint reports the pain has moved up out of his low back and is now in his mid back. He did do some raking yesterday.    Pertinent History extensive abdominal surgerys ( 8 surgeies total)    Limitations  Standing;Walking    How long can you stand comfortably? pain increased with standing time    How long can you walk comfortably? limited distances    Diagnostic tests nothing sicne the operation    Patient Stated Goals to have less pain    Currently in Pain? Yes    Pain Score 5     Pain Location Back    Pain Orientation Right;Left;Mid    Pain Descriptors / Indicators Aching    Pain Type Chronic pain    Pain Onset More than a month ago    Pain Frequency Constant    Aggravating Factors  standin and walking    Pain Relieving Factors rest    Effect of Pain on Daily Activities difficulty perfroming ADL's    Multiple Pain Sites No  [place] Adult PT Treatment/Exercise - 12/10/20 0001       Lumbar Exercises: Stretches   Lower Trunk Rotation Limitations x20    Piriformis Stretch Limitations 3x20 sec hold    Other Lumbar Stretch Exercise open book stretch x10 each side      Lumbar Exercises: Standing   Other Standing Lumbar Exercises scap retraction x20 red; shoulder extension x20 red      Lumbar Exercises: Supine   Other Supine Lumbar Exercises clam shell x20 red; supine march x20 red; bridge x20 red    Other Supine Lumbar Exercises wand flexion 3x10 2lb wreigh      Manual Therapy   Manual Therapy Soft tissue mobilization;Manual Traction    Soft tissue mobilization to lumbar spine and mid thoriacic area    Manual Traction garde II and III LAD                     PT Education - 12/09/20 1546     Education Details reviewed mid thoriacic stretching    Person(s) Educated Patient    Methods Explanation;Demonstration;Tactile cues;Verbal cues    Comprehension Verbalized understanding;Returned demonstration;Verbal cues required;Tactile cues required              PT Short Term Goals - 11/27/20 0926       PT SHORT TERM GOAL #1   Title Patient will be indepednent with basic HEP    Time 3    Period Weeks    Status New     Target Date 12/18/20      PT SHORT TERM GOAL #2   Title Patient will increase bilateral LE strength to 5/5    Time 3    Period Weeks    Status New    Target Date 12/18/20      PT SHORT TERM GOAL #3   Title Patient will sit for 30 minutes without haveing to shift off the left side    Time 3    Period Weeks    Status New    Target Date 12/18/20               PT Long Term Goals - 11/27/20 0928       PT LONG TERM GOAL #1   Title Patient will perform ADL's and IADL's without pain in his back    Time 6    Period Weeks    Status New    Target Date 01/08/21      PT LONG TERM GOAL #2   Title Patient will ambualte 3000' without increased pain    Time 6    Period Weeks    Status New    Target Date 01/08/21      PT LONG TERM GOAL #3   Title He will be able to be on feet for 30-45 min before pain starts    Time 6    Period Weeks    Status New    Target Date 01/08/21                   Plan - 12/09/20 1547     Clinical Impression Statement Patient has spasming in his QL and mid throacic area. He reported improved pain with trigger point releease. His spasming has improved significantly in his gluteals today, although it is still tender to palpation. he tolerated ther-ex well. Overall he is making googd progress. He was encouraged to use his tennis ball if he has spasming after yard work. He will  see his MD tomorrow for his abdominal surgery and ask about dry needling.    Personal Factors and Comorbidities Comorbidity 3+    Comorbidities anxiety, hernia repair,    Examination-Activity Limitations Stand;Stairs;Locomotion Level;Transfers    Examination-Participation Restrictions Community Activity;Shop;Occupation    Stability/Clinical Decision Making Evolving/Moderate complexity    Clinical Decision Making Moderate    Rehab Potential Good    PT Frequency 2x / week    PT Duration 6 weeks    PT Treatment/Interventions ADLs/Self Care Home Management;Electrical  Stimulation;Iontophoresis '4mg'$ /ml Dexamethasone;DME Instruction;Cryotherapy;Moist Heat;Gait training;Stair training;Functional mobility training;Therapeutic activities;Therapeutic exercise;Neuromuscular re-education;Patient/family education;Manual techniques;Passive range of motion;Taping    PT Next Visit Plan manual therapy to lumbar spine;review HEP; review table core exercises.    PT Home Exercise Plan ccess Code: [email or web address]  URL: [email or web address]  Date: 11/27/2020  Prepared by: Carolyne Littles    Exercises  Supine Piriformis Stretch with Foot on Ground - 1 x daily - 7 x weekly - 1 sets - 3 reps - 20 hold  Supine Lower Trunk Rotation - 1 x daily - 7 x weekly - 3 sets - 10 reps  Shoulder extension with resistance - Neutral - 1 x daily - 7 x weekly - 3 sets - 10 reps  Scapular Retraction with Resistance - 1 x daily - 7 x weekly - 3 sets - 10 reps    Consulted and Agree with Plan of Care Patient             Patient will benefit from skilled therapeutic intervention in order to improve the following deficits and impairments:  Abnormal gait, Pain, Decreased strength, Decreased mobility, Decreased activity tolerance, Increased muscle spasms, Increased fascial restricitons, Decreased range of motion  Visit Diagnosis: Chronic bilateral low back pain without sciatica  Pain in thoracic spine  Cramp and spasm  Abnormal posture     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   Bilateral inguinal hernia 09/28/2018   Incisional hernia 03/17/2018   Trigger thumb, left thumb 01/19/2018   Trigger finger, left index finger 07/18/2017   Trigger finger, right ring finger 01/31/2017   Trigger finger of left thumb 01/03/2017   Trigger finger of right thumb 01/03/2017   Trigger index finger of left hand 01/03/2017   Trigger index finger of right hand 01/03/2017   Malnutrition of  moderate degree 07/19/2016   Intra-abdominal abscess ([place]) 07/16/2016   Cellulitis 07/15/2016   S/P colostomy takedown 06/28/2016   Pain in thoracic spine 06/09/2016   Mid back pain 06/09/2016   Postoperative fever 11/19/2015   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   Severe protein-calorie malnutrition ([place]) 11/17/2015   Sepsis ([place]) 11/14/2015   Hiccups 11/14/2015   AKI (acute kidney injury) ([place]) 11/14/2015   Fever    Leg swelling    Left shoulder pain    Muscle spasm of left shoulder    GI bleed 10/27/2015   Acute blood loss anemia 10/27/2015   Syncope 10/27/2015   Hyperglycemia 10/27/2015   Hypotension 10/27/2015   Neck pain 10/27/2015   Hematemesis 10/27/2015   Hematochezia 10/27/2015   Diverticulitis of colon with perforation 07/05/2015    Carney Living, PT 12/10/2020, 1:35 PM  [facility] [address] [place], [name], [address] Phone: [phone]   Fax:  [phone]  Name: Miguel [name] MRN: [id number] Date of Birth: February 18, 1951

## 2020-12-12 ENCOUNTER — Encounter (HOSPITAL_BASED_OUTPATIENT_CLINIC_OR_DEPARTMENT_OTHER): Payer: Self-pay | Admitting: Physical Therapy

## 2020-12-12 ENCOUNTER — Ambulatory Visit (HOSPITAL_BASED_OUTPATIENT_CLINIC_OR_DEPARTMENT_OTHER): Payer: Medicare Other | Admitting: Physical Therapy

## 2020-12-12 ENCOUNTER — Other Ambulatory Visit: Payer: Self-pay

## 2020-12-12 DIAGNOSIS — M546 Pain in thoracic spine: Secondary | ICD-10-CM | POA: Diagnosis not present

## 2020-12-12 DIAGNOSIS — R252 Cramp and spasm: Secondary | ICD-10-CM

## 2020-12-12 DIAGNOSIS — G8929 Other chronic pain: Secondary | ICD-10-CM

## 2020-12-12 DIAGNOSIS — M545 Low back pain, unspecified: Secondary | ICD-10-CM | POA: Diagnosis not present

## 2020-12-12 DIAGNOSIS — R293 Abnormal posture: Secondary | ICD-10-CM | POA: Diagnosis not present

## 2020-12-12 NOTE — Therapy (Signed)
[facility] [address] [place], [name], [address] Phone: [phone]   Fax:  [phone]  Physical Therapy Treatment  Patient Details  Name: Miguel [name] MRN: [id number] Date of Birth: 1951-03-22 Referring Provider (PT): Dr Jean Rosenthal   Encounter Date: 12/12/2020   PT End of Session - 12/12/20 1906     Visit Number 5    Number of Visits 12    Date for PT Re-Evaluation 01/07/21    Authorization Type MCR A and B    PT Start Time [id number]    PT Stop Time 1430    PT Time Calculation (min) 35 min    Activity Tolerance Patient tolerated treatment well    Behavior During Therapy [facility] for tasks assessed/performed             Past Medical History:  Diagnosis Date   Anemia    Anxiety    BPH (benign prostatic hyperplasia)    Gastritis    GERD (gastroesophageal reflux disease)    "seldom" (07/16/2016)   GI bleed due to NSAIDs 10/27/2015   History of blood transfusion 06/2016   post OR/notes 07/15/2016   History of hiatal hernia    History of kidney stones    surgery to remove stone   Hyperlipidemia    Hypertension    no meds    Melanoma of back ([place])    "mid back"   Sigmoid diverticulitis    with perforation    Past Surgical History:  Procedure Laterality Date   COLON SURGERY     sigmoid   COLOSTOMY TAKEDOWN N/A 06/28/2016   Procedure: COLOSTOMY TAKEDOWN;  Surgeon: [name], MD;  Location: [facility];  Service: General;  Laterality: N/A;   CYSTOSCOPY WITH RETROGRADE PYELOGRAM, URETEROSCOPY AND STENT PLACEMENT Bilateral 02/12/2020   Procedure: CYSTOSCOPY WITH BILATERAL RETROGRADE PYELOGRAM,  AND LITHOPEXY;  Surgeon: Remi Haggard, MD;  Location: [facility];  Service: Urology;  Laterality: Bilateral;  1 HR   ESOPHAGOGASTRODUODENOSCOPY N/A 10/27/2015   Procedure: ESOPHAGOGASTRODUODENOSCOPY (EGD);  Surgeon: Clarene Essex, MD;  Location: [facility];  Service: Endoscopy;  Laterality: N/A;    ESOPHAGOGASTRODUODENOSCOPY N/A 10/29/2015   Procedure: ESOPHAGOGASTRODUODENOSCOPY (EGD);  Surgeon: Ronald Lobo, MD;  Location: [facility] ENDOSCOPY;  Service: Endoscopy;  Laterality: N/A;   ESOPHAGOGASTRODUODENOSCOPY N/A 10/30/2015   Procedure: ESOPHAGOGASTRODUODENOSCOPY (EGD);  Surgeon: Clarene Essex, MD;  Location: [facility] ENDOSCOPY;  Service: Endoscopy;  Laterality: N/A;   g tube discontinued  04/2020   per patient   [place]  11/21/2015   REDUCTION OF HIATAL HERNIA , REPAIR HIATAL HERNIA, RESECTION SMALL BOWEL WITH ANASTOMOSIS, PLACEMENT GASTROSTOMY TUBE, PLACEMENT DUODENOSTOMY TUBE (N/A)   HEMORRHOID BANDING  X 2   HERNIA REPAIR     HIATAL HERNIA REPAIR N/A 11/21/2015   Procedure: REDUCTION OF HIATAL HERNIA , REPAIR HIATAL HERNIA, RESECTION SMALL BOWEL WITH ANASTOMOSIS, PLACEMENT GASTROSTOMY TUBE, PLACEMENT DUODENOSTOMY TUBE;  Surgeon: Mickeal Skinner, MD;  Location: [place];  Service: General;  Laterality: N/A;   HOLMIUM LASER APPLICATION Right [id number]   Procedure: HOLMIUM LASER APPLICATION;  Surgeon: Remi Haggard, MD;  Location: [facility];  Service: Urology;  Laterality: Right;   INCISIONAL HERNIA REPAIR  06/28/2016   open/notes 07/15/2016   INCISIONAL HERNIA REPAIR  03/17/2018   WITH MESH   INCISIONAL HERNIA REPAIR N/A 03/17/2018   Procedure: INCISIONAL HERNIA REPAIR WITH MESH;  Surgeon: [name], MD;  Location: [place];  Service: General;  Laterality: N/A;   [place] N/A  10/16/2020   Procedure: INCISIONAL HERNIA REPAIR WITH MESH;  Surgeon: [name], MD;  Location: [place];  Service: General;  Laterality: N/A;   INGUINAL HERNIA REPAIR Bilateral 09/28/2018   INGUINAL HERNIA REPAIR Bilateral 09/28/2018   Procedure: BILATERAL OPEN INGUINAL HERNIA REPAIR WITH MESH;  Surgeon: [name], MD;  Location: [facility];  Service: General;  Laterality: Bilateral;  GENERAL AND TAP BLOCK   INSERTION OF MESH N/A 03/17/2018   Procedure: INSERTION OF MESH;   Surgeon: [name], MD;  Location: [place];  Service: General;  Laterality: N/A;   INSERTION OF MESH Bilateral 09/28/2018   Procedure: Insertion Of Mesh;  Surgeon: [name], MD;  Location: [place];  Service: General;  Laterality: Bilateral;   IR CM INJ ANY COLONIC TUBE W/FLUORO  02/04/2017   IR GUIDED DRAIN W CATHETER PLACEMENT  07/06/2016   /NOTES 07/15/2016   IR PATIENT EVAL TECH 0-60 MINS  06/28/2019   IR RADIOLOGIST EVAL & MGMT  07/27/2016   IR RADIOLOGIST EVAL & MGMT  08/17/2016   IR RADIOLOGIST EVAL & MGMT  08/26/2016   IR REPLACE G-TUBE SIMPLE WO FLUORO  07/28/2017   IR REPLACE G-TUBE SIMPLE WO FLUORO  01/31/2018   IR REPLACE G-TUBE SIMPLE WO FLUORO  10/16/2018   IR REPLACE G-TUBE SIMPLE WO FLUORO  03/05/2019   IR REPLACE G-TUBE SIMPLE WO FLUORO  08/22/2019   IR REPLACE G-TUBE SIMPLE WO FLUORO  01/08/2020   IR [place] GASTRO/COLONIC TUBE PERCUT W/FLUORO  08/04/2016   IR [place] GASTRO/COLONIC TUBE PERCUT W/FLUORO  01/14/2017   IR US GUIDE BX ASP/DRAIN  08/04/2016   KNEE CARTILAGE SURGERY Right 1971   "opened me up"   LAPAROTOMY N/A 07/05/2015   Procedure: PARTIAL SIGMOID COLECTOMY AND COLOSTOMY;  Surgeon: [name], MD;  Location: [facility];  Service: General;  Laterality: N/A;   MELANOMA EXCISION  2001   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   Subjective Assessment - 12/12/20 1902     Subjective Patient reports his lower back feels good. He is having pain in his mid thoracic spine. He has been to the MD. He has been cleared for dry needling.    Pertinent History extensive abdominal surgerys ( 8  surgeies total)    Limitations Standing;Walking    How long can you stand comfortably? pain increased with standing time    How long can you walk comfortably? limited distances    Diagnostic tests nothing sicne the operation    Patient Stated Goals to have less pain    Currently in Pain? Yes    Pain Score 4     Pain Location Thoracic    Pain Orientation Right;Left    Pain Descriptors / Indicators Aching    Pain Type Chronic pain    Pain Onset More than a month ago    Pain Frequency Constant    Aggravating Factors  standing and walking    Pain Relieving Factors rest    Effect of Pain on Daily Activities difficulty perfroming ADL's  [place] Adult PT Treatment/Exercise - 12/12/20 0001       Lumbar Exercises: Stretches   Lower Trunk Rotation Limitations x20      Lumbar Exercises: Standing   Other Standing Lumbar Exercises scap retraction x20 red; shoulder extension x20 red      Lumbar Exercises: Seated   Other Seated Lumbar Exercises bilateral er red 2x10; horizontal abdcution 2x10 red; shoulder flexion 2x10 red    Other Seated Lumbar Exercises LAQ x20 red; march red x20; hip abduction red x20              Trigger Point Dry Needling - 12/12/20 0001     Consent Given? Yes    Muscles Treated Back/Hip Thoracic multifidi    Other Dry Needling 2 spots in lower thoriacic mutifidi using a .30x30                   PT Education - 12/12/20 1905     Education Details benefis and risk of TPDN    Person(s) Educated Patient    Methods Explanation;Demonstration;Tactile cues;Verbal cues    Comprehension Verbalized understanding;Verbal cues required;Tactile cues required;Returned demonstration              PT Short Term Goals - 11/27/20 0926       PT SHORT TERM GOAL #1   Title Patient will be indepednent with basic HEP    Time 3    Period Weeks    Status New    Target Date 12/18/20      PT SHORT TERM GOAL #2    Title Patient will increase bilateral LE strength to 5/5    Time 3    Period Weeks    Status New    Target Date 12/18/20      PT SHORT TERM GOAL #3   Title Patient will sit for 30 minutes without haveing to shift off the left side    Time 3    Period Weeks    Status New    Target Date 12/18/20               PT Long Term Goals - 11/27/20 0928       PT LONG TERM GOAL #1   Title Patient will perform ADL's and IADL's without pain in his back    Time 6    Period Weeks    Status New    Target Date 01/08/21      PT LONG TERM GOAL #2   Title Patient will ambualte 3000' without increased pain    Time 6    Period Weeks    Status New    Target Date 01/08/21      PT LONG TERM GOAL #3   Title He will be able to be on feet for 30-45 min before pain starts    Time 6    Period Weeks    Status New    Target Date 01/08/21                   Plan - 12/12/20 1908     Clinical Impression Statement Patient had a great twitch response on the left side.multifidi. Therapy perfromed manual therapy to his lubar and thoriacic spine. he had no pain in his thoriacic spine but reported pain with palpation. Patient perfromed LE stability exercises with cuing for posture. He was able to self correct his poture. He was somewhat limited by time today.    Personal Factors and Comorbidities Comorbidity 3+  Comorbidities anxiety, hernia repair,    Examination-Activity Limitations Stand;Stairs;Locomotion Level;Transfers    Examination-Participation Restrictions Community Activity;Shop;Occupation    Stability/Clinical Decision Making Evolving/Moderate complexity    Clinical Decision Making Moderate    Rehab Potential Good    PT Frequency 2x / week    PT Duration 6 weeks    PT Treatment/Interventions ADLs/Self Care Home Management;Electrical Stimulation;Iontophoresis '4mg'$ /ml Dexamethasone;DME Instruction;Cryotherapy;Moist Heat;Gait training;Stair training;Functional mobility  training;Therapeutic activities;Therapeutic exercise;Neuromuscular re-education;Patient/family education;Manual techniques;Passive range of motion;Taping    PT Next Visit Plan manual therapy to lumbar spine;review HEP; review table core exercises.    PT Home Exercise Plan ccess Code: [email or web address]  URL: [email or web address]  Date: 11/27/2020  Prepared by: Carolyne Littles    Exercises  Supine Piriformis Stretch with Foot on Ground - 1 x daily - 7 x weekly - 1 sets - 3 reps - 20 hold  Supine Lower Trunk Rotation - 1 x daily - 7 x weekly - 3 sets - 10 reps  Shoulder extension with resistance - Neutral - 1 x daily - 7 x weekly - 3 sets - 10 reps  Scapular Retraction with Resistance - 1 x daily - 7 x weekly - 3 sets - 10 reps    Consulted and Agree with Plan of Care Patient             Patient will benefit from skilled therapeutic intervention in order to improve the following deficits and impairments:  Abnormal gait, Pain, Decreased strength, Decreased mobility, Decreased activity tolerance, Increased muscle spasms, Increased fascial restricitons, Decreased range of motion  Visit Diagnosis: Chronic bilateral low back pain without sciatica  Pain in thoracic spine  Cramp and spasm  Abnormal posture     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   Bilateral inguinal hernia 09/28/2018   Incisional hernia 03/17/2018   Trigger thumb, left thumb 01/19/2018   Trigger finger, left index finger 07/18/2017   Trigger finger, right ring finger 01/31/2017   Trigger finger of left thumb 01/03/2017   Trigger finger of right thumb 01/03/2017   Trigger index finger of left hand 01/03/2017   Trigger index finger of right hand 01/03/2017   Malnutrition of moderate degree 07/19/2016   Intra-abdominal abscess ([place]) 07/16/2016   Cellulitis 07/15/2016   S/P colostomy takedown 06/28/2016    Pain in thoracic spine 06/09/2016   Mid back pain 06/09/2016   Postoperative fever 11/19/2015   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   Severe protein-calorie malnutrition ([place]) 11/17/2015   Sepsis ([place]) 11/14/2015   Hiccups 11/14/2015   AKI (acute kidney injury) ([place]) 11/14/2015   Fever    Leg swelling    Left shoulder pain    Muscle spasm of left shoulder    GI bleed 10/27/2015   Acute blood loss anemia 10/27/2015   Syncope 10/27/2015   Hyperglycemia 10/27/2015   Hypotension 10/27/2015   Neck pain 10/27/2015   Hematemesis 10/27/2015   Hematochezia 10/27/2015   Diverticulitis of colon with perforation 07/05/2015    Carney Living, PT 12/12/2020, 7:40 PM  [facility] [address] [place], [name], [address] Phone: [phone]   Fax:  [phone]  Name: Miguel [name] MRN: [id number] Date of Birth: 12-08-1950

## 2020-12-15 DIAGNOSIS — I10 Essential (primary) hypertension: Secondary | ICD-10-CM | POA: Diagnosis not present

## 2020-12-16 ENCOUNTER — Ambulatory Visit (HOSPITAL_BASED_OUTPATIENT_CLINIC_OR_DEPARTMENT_OTHER): Payer: Medicare Other | Admitting: Physical Therapy

## 2020-12-16 ENCOUNTER — Other Ambulatory Visit: Payer: Self-pay

## 2020-12-16 DIAGNOSIS — R252 Cramp and spasm: Secondary | ICD-10-CM

## 2020-12-16 DIAGNOSIS — M546 Pain in thoracic spine: Secondary | ICD-10-CM

## 2020-12-16 DIAGNOSIS — M545 Low back pain, unspecified: Secondary | ICD-10-CM | POA: Diagnosis not present

## 2020-12-16 DIAGNOSIS — R293 Abnormal posture: Secondary | ICD-10-CM

## 2020-12-16 DIAGNOSIS — G8929 Other chronic pain: Secondary | ICD-10-CM | POA: Diagnosis not present

## 2020-12-17 ENCOUNTER — Encounter (HOSPITAL_BASED_OUTPATIENT_CLINIC_OR_DEPARTMENT_OTHER): Payer: Self-pay | Admitting: Physical Therapy

## 2020-12-17 NOTE — Therapy (Signed)
[facility] [address] [place], [name], [address] Phone: [phone]   Fax:  [phone]  Physical Therapy Treatment  Patient Details  Name: Miguel [name] MRN: [id number] Date of Birth: 08-02-1950 Referring Provider (PT): Dr Jean Rosenthal   Encounter Date: 12/16/2020   PT End of Session - 12/17/20 0900     Visit Number 6    Number of Visits 12    Date for PT Re-Evaluation 01/07/21    Authorization Type MCR A and B    PT Start Time 1520   Patient 5 min late   PT Stop Time 1600    PT Time Calculation (min) 40 min    Activity Tolerance Patient tolerated treatment well    Behavior During Therapy [facility] for tasks assessed/performed             Past Medical History:  Diagnosis Date   Anemia    Anxiety    BPH (benign prostatic hyperplasia)    Gastritis    GERD (gastroesophageal reflux disease)    "seldom" (07/16/2016)   GI bleed due to NSAIDs 10/27/2015   History of blood transfusion 06/2016   post OR/notes 07/15/2016   History of hiatal hernia    History of kidney stones    surgery to remove stone   Hyperlipidemia    Hypertension    no meds    Melanoma of back ([place])    "mid back"   Sigmoid diverticulitis    with perforation    Past Surgical History:  Procedure Laterality Date   COLON SURGERY     sigmoid   COLOSTOMY TAKEDOWN N/A 06/28/2016   Procedure: COLOSTOMY TAKEDOWN;  Surgeon: [name], MD;  Location: [place];  Service: General;  Laterality: N/A;   CYSTOSCOPY WITH RETROGRADE PYELOGRAM, URETEROSCOPY AND STENT PLACEMENT Bilateral 02/12/2020   Procedure: CYSTOSCOPY WITH BILATERAL RETROGRADE PYELOGRAM,  AND LITHOPEXY;  Surgeon: Remi Haggard, MD;  Location: [facility];  Service: Urology;  Laterality: Bilateral;  1 HR   ESOPHAGOGASTRODUODENOSCOPY N/A 10/27/2015   Procedure: ESOPHAGOGASTRODUODENOSCOPY (EGD);  Surgeon: Clarene Essex, MD;  Location: [facility] ENDOSCOPY;  Service: Endoscopy;  Laterality: N/A;    ESOPHAGOGASTRODUODENOSCOPY N/A 10/29/2015   Procedure: ESOPHAGOGASTRODUODENOSCOPY (EGD);  Surgeon: Ronald Lobo, MD;  Location: [facility] ENDOSCOPY;  Service: Endoscopy;  Laterality: N/A;   ESOPHAGOGASTRODUODENOSCOPY N/A 10/30/2015   Procedure: ESOPHAGOGASTRODUODENOSCOPY (EGD);  Surgeon: Clarene Essex, MD;  Location: [facility] ENDOSCOPY;  Service: Endoscopy;  Laterality: N/A;   g tube discontinued  04/2020   per patient   [place]  11/21/2015   REDUCTION OF HIATAL HERNIA , REPAIR HIATAL HERNIA, RESECTION SMALL BOWEL WITH ANASTOMOSIS, PLACEMENT GASTROSTOMY TUBE, PLACEMENT DUODENOSTOMY TUBE (N/A)   HEMORRHOID BANDING  X 2   HERNIA REPAIR     HIATAL HERNIA REPAIR N/A 11/21/2015   Procedure: REDUCTION OF HIATAL HERNIA , REPAIR HIATAL HERNIA, RESECTION SMALL BOWEL WITH ANASTOMOSIS, PLACEMENT GASTROSTOMY TUBE, PLACEMENT DUODENOSTOMY TUBE;  Surgeon: Mickeal Skinner, MD;  Location: [place];  Service: General;  Laterality: N/A;   HOLMIUM LASER APPLICATION Right 34/19/6222   Procedure: HOLMIUM LASER APPLICATION;  Surgeon: Remi Haggard, MD;  Location: [facility];  Service: Urology;  Laterality: Right;   INCISIONAL HERNIA REPAIR  06/28/2016   open/notes 07/15/2016   INCISIONAL HERNIA REPAIR  03/17/2018   WITH MESH   INCISIONAL HERNIA REPAIR N/A 03/17/2018   Procedure: INCISIONAL HERNIA REPAIR WITH MESH;  Surgeon: [name], MD;  Location: [place];  Service: General;  Laterality: N/A;  INCISIONAL HERNIA REPAIR N/A 10/16/2020   Procedure: INCISIONAL HERNIA REPAIR WITH MESH;  Surgeon: [name], MD;  Location: [place];  Service: General;  Laterality: N/A;   INGUINAL HERNIA REPAIR Bilateral 09/28/2018   INGUINAL HERNIA REPAIR Bilateral 09/28/2018   Procedure: BILATERAL OPEN INGUINAL HERNIA REPAIR WITH MESH;  Surgeon: [name], MD;  Location: [place];  Service: General;  Laterality: Bilateral;  GENERAL AND TAP BLOCK   INSERTION OF MESH N/A 03/17/2018   Procedure: INSERTION OF MESH;   Surgeon: [name], MD;  Location: [facility];  Service: General;  Laterality: N/A;   INSERTION OF MESH Bilateral 09/28/2018   Procedure: Insertion Of Mesh;  Surgeon: [name], MD;  Location: [place];  Service: General;  Laterality: Bilateral;   IR CM INJ ANY COLONIC TUBE W/FLUORO  02/04/2017   IR GUIDED DRAIN W CATHETER PLACEMENT  07/06/2016   /NOTES 07/15/2016   IR PATIENT EVAL TECH 0-60 MINS  06/28/2019   IR RADIOLOGIST EVAL & MGMT  07/27/2016   IR RADIOLOGIST EVAL & MGMT  08/17/2016   IR RADIOLOGIST EVAL & MGMT  08/26/2016   IR REPLACE G-TUBE SIMPLE WO FLUORO  07/28/2017   IR REPLACE G-TUBE SIMPLE WO FLUORO  01/31/2018   IR REPLACE G-TUBE SIMPLE WO FLUORO  10/16/2018   IR REPLACE G-TUBE SIMPLE WO FLUORO  03/05/2019   IR REPLACE G-TUBE SIMPLE WO FLUORO  08/22/2019   IR REPLACE G-TUBE SIMPLE WO FLUORO  01/08/2020   IR [place] GASTRO/COLONIC TUBE PERCUT W/FLUORO  08/04/2016   IR [place] GASTRO/COLONIC TUBE PERCUT W/FLUORO  01/14/2017   IR US GUIDE BX ASP/DRAIN  08/04/2016   KNEE CARTILAGE SURGERY Right 1971   "opened me up"   LAPAROTOMY N/A 07/05/2015   Procedure: PARTIAL SIGMOID COLECTOMY AND COLOSTOMY;  Surgeon: [name], MD;  Location: [place] [facility];  Service: General;  Laterality: N/A;   MELANOMA EXCISION  2001   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   Subjective Assessment - 12/17/20 0841     Subjective Patients pain continues to move up his spine. It is now more mid to high thoriaci. He did a lot of activity outside today.    Pertinent History extensive abdominal surgerys ( 8 surgeies total)    Limitations  Standing;Walking    How long can you stand comfortably? pain increased with standing time    How long can you walk comfortably? limited distances    Diagnostic tests nothing sicne the operation    Currently in Pain? Yes    Pain Score 4     Pain Location Thoracic    Pain Orientation Right;Left    Pain Descriptors / Indicators Aching    Pain Type Chronic pain    Pain Onset More than a month ago    Pain Frequency Constant    Aggravating Factors  standing and walking    Pain Relieving Factors rest    Effect of Pain on Daily Activities difficult perfroming yard work    Multiple Pain Sites No  [place] Adult PT Treatment/Exercise - 12/17/20 0001       Lumbar Exercises: Stretches   Lower Trunk Rotation Limitations x20      Lumbar Exercises: Machines for Strengthening   Leg Press 3x10 20lbs    Other Lumbar Machine Exercise shoulder extension 3x10 10lbs; scap retraction 3x10 20lbs      Manual Therapy   Manual Therapy Soft tissue mobilization;Manual Traction    Manual therapy comments skilled palpation of trigger points    Soft tissue mobilization to lumbar spine and mid thoriacic area    Manual Traction garde II and III LAD              Trigger Point Dry Needling - 12/17/20 0001     Consent Given? Yes    Muscles Treated Back/Hip Thoracic multifidi    Other Dry Needling 2 spots on each side of the thoriacic spine                   PT Education - 12/17/20 0844     Education Details reviewed set up of gym equipment    Person(s) Educated Patient    Methods Demonstration;Tactile cues;Verbal cues;Explanation    Comprehension Verbalized understanding;Returned demonstration;Verbal cues required;Tactile cues required              PT Short Term Goals - 11/27/20 0926       PT SHORT TERM GOAL #1   Title Patient will be indepednent with basic HEP    Time 3    Period Weeks    Status New    Target Date 12/18/20       PT SHORT TERM GOAL #2   Title Patient will increase bilateral LE strength to 5/5    Time 3    Period Weeks    Status New    Target Date 12/18/20      PT SHORT TERM GOAL #3   Title Patient will sit for 30 minutes without haveing to shift off the left side    Time 3    Period Weeks    Status New    Target Date 12/18/20               PT Long Term Goals - 11/27/20 0928       PT LONG TERM GOAL #1   Title Patient will perform ADL's and IADL's without pain in his back    Time 6    Period Weeks    Status New    Target Date 01/08/21      PT LONG TERM GOAL #2   Title Patient will ambualte 3000' without increased pain    Time 6    Period Weeks    Status New    Target Date 01/08/21      PT LONG TERM GOAL #3   Title He will be able to be on feet for 30-45 min before pain starts    Time 6    Period Weeks    Status New    Target Date 01/08/21                   Plan - 12/17/20 0901     Clinical Impression Statement Patient had tenderness to plappation. During light palpation he had a cavitation in his thoracic spine and after it was more difficult to find the trigger points. Therapy needled his mid throacic paraspinals. Therapy also added gym exercises. he reported they felt good. He did his cane exercises in sitting,  Personal Factors and Comorbidities Comorbidity 3+    Comorbidities anxiety, hernia repair,    Examination-Activity Limitations Stand;Stairs;Locomotion Level;Transfers    Examination-Participation Restrictions Community Activity;Shop;Occupation    Stability/Clinical Decision Making Evolving/Moderate complexity    Clinical Decision Making Moderate    Rehab Potential Good    PT Frequency 2x / week    PT Duration 6 weeks    PT Treatment/Interventions ADLs/Self Care Home Management;Electrical Stimulation;Iontophoresis 4mg /ml Dexamethasone;DME Instruction;Cryotherapy;Moist Heat;Gait training;Stair training;Functional mobility training;Therapeutic  activities;Therapeutic exercise;Neuromuscular re-education;Patient/family education;Manual techniques;Passive range of motion;Taping    PT Next Visit Plan manual therapy to lumbar spine;review HEP; review table core exercises.    PT Home Exercise Plan ccess Code: [email or web address]  URL: [email or web address]  Date: 11/27/2020  Prepared by: Carolyne Littles    Exercises  Supine Piriformis Stretch with Foot on Ground - 1 x daily - 7 x weekly - 1 sets - 3 reps - 20 hold  Supine Lower Trunk Rotation - 1 x daily - 7 x weekly - 3 sets - 10 reps  Shoulder extension with resistance - Neutral - 1 x daily - 7 x weekly - 3 sets - 10 reps  Scapular Retraction with Resistance - 1 x daily - 7 x weekly - 3 sets - 10 reps    Consulted and Agree with Plan of Care Patient             Patient will benefit from skilled therapeutic intervention in order to improve the following deficits and impairments:  Abnormal gait, Pain, Decreased strength, Decreased mobility, Decreased activity tolerance, Increased muscle spasms, Increased fascial restricitons, Decreased range of motion  Visit Diagnosis: Chronic bilateral low back pain without sciatica  Pain in thoracic spine  Cramp and spasm  Abnormal posture     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   Bilateral inguinal hernia 09/28/2018   Incisional hernia 03/17/2018   Trigger thumb, left thumb 01/19/2018   Trigger finger, left index finger 07/18/2017   Trigger finger, right ring finger 01/31/2017   Trigger finger of left thumb 01/03/2017   Trigger finger of right thumb 01/03/2017   Trigger index finger of left hand 01/03/2017   Trigger index finger of right hand 01/03/2017   Malnutrition of moderate degree 07/19/2016   Intra-abdominal abscess ([place]) 07/16/2016   Cellulitis 07/15/2016   S/P colostomy takedown 06/28/2016   Pain in thoracic  spine 06/09/2016   Mid back pain 06/09/2016   Postoperative fever 11/19/2015   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   Severe protein-calorie malnutrition ([place]) 11/17/2015   Sepsis ([place]) 11/14/2015   Hiccups 11/14/2015   AKI (acute kidney injury) ([place]) 11/14/2015   Fever    Leg swelling    Left shoulder pain    Muscle spasm of left shoulder    GI bleed 10/27/2015   Acute blood loss anemia 10/27/2015   Syncope 10/27/2015   Hyperglycemia 10/27/2015   Hypotension 10/27/2015   Neck pain 10/27/2015   Hematemesis 10/27/2015   Hematochezia 10/27/2015   Diverticulitis of colon with perforation 07/05/2015    Carney Living, PT 12/17/2020, 1:18 PM  [facility] [address] [place], [name], [address] Phone: [phone]   Fax:  [phone]  Name: YUVAAN OLANDER MRN: [id number] Date of Birth: [birth year]

## 2020-12-22 ENCOUNTER — Encounter: Payer: Self-pay | Admitting: Orthopaedic Surgery

## 2020-12-22 ENCOUNTER — Other Ambulatory Visit: Payer: Self-pay

## 2020-12-22 ENCOUNTER — Ambulatory Visit (INDEPENDENT_AMBULATORY_CARE_PROVIDER_SITE_OTHER): Payer: Medicare Other | Admitting: Orthopaedic Surgery

## 2020-12-22 DIAGNOSIS — G8929 Other chronic pain: Secondary | ICD-10-CM | POA: Diagnosis not present

## 2020-12-22 DIAGNOSIS — M5442 Lumbago with sciatica, left side: Secondary | ICD-10-CM | POA: Diagnosis not present

## 2020-12-22 MED ORDER — HYDROCODONE-ACETAMINOPHEN 5-325 MG PO TABS: 1.0000 | ORAL_TABLET | Freq: Four times a day (QID) | ORAL | 0 refills | Status: DC | PRN

## 2020-12-22 NOTE — Progress Notes (Signed)
The patient is a cachectic individual who is 70 years old and well-known to me.  He comes in with continued left-sided low back pain that radiates into his abdominal area.  It does radiate into the SI joint and the trochanteric area of his left hip.  We have had him through physical therapy and that has helped quite a bit but he still having these radicular pains that are worrisome to him and painful.  He has negative straight leg raise to the left side but definitely radicular pain with no rash this coming graft in the mid lumbar spine and radiating to the abdomen.  There are some pain of the trochanteric area and his SI joint area.  At this point a MRI is warranted of the lumbar spine to rule out any type of herniated disc and to help guide treatment modalities such as an epidural steroid injection versus an SI joint injection.  We will see him once we have the MRI of his lumbar spine.

## 2020-12-23 ENCOUNTER — Encounter (HOSPITAL_BASED_OUTPATIENT_CLINIC_OR_DEPARTMENT_OTHER): Payer: Self-pay | Admitting: Physical Therapy

## 2020-12-23 ENCOUNTER — Ambulatory Visit (HOSPITAL_BASED_OUTPATIENT_CLINIC_OR_DEPARTMENT_OTHER): Payer: Medicare Other | Admitting: Physical Therapy

## 2020-12-23 DIAGNOSIS — G8929 Other chronic pain: Secondary | ICD-10-CM

## 2020-12-23 DIAGNOSIS — R293 Abnormal posture: Secondary | ICD-10-CM

## 2020-12-23 DIAGNOSIS — M546 Pain in thoracic spine: Secondary | ICD-10-CM | POA: Diagnosis not present

## 2020-12-23 DIAGNOSIS — R252 Cramp and spasm: Secondary | ICD-10-CM | POA: Diagnosis not present

## 2020-12-23 DIAGNOSIS — M545 Low back pain, unspecified: Secondary | ICD-10-CM | POA: Diagnosis not present

## 2020-12-23 NOTE — Addendum Note (Signed)
Addended by: Robyne Peers on: 12/23/2020 10:58 AM   Modules accepted: Orders

## 2020-12-24 NOTE — Therapy (Signed)
[facility] [address] [place], [name], [address] Phone: [phone]   Fax:  [phone]  Physical Therapy Treatment  Patient Details  Name: Miguel [name] MRN: [id number] Date of Birth: 1950-09-21 Referring Provider (PT): Dr Jean Rosenthal   Encounter Date: 12/23/2020   PT End of Session - 12/24/20 1735     Visit Number 7    Number of Visits 12    Date for PT Re-Evaluation 01/07/21    Authorization Type MCR A and B    PT Start Time 5573    PT Stop Time 1558    PT Time Calculation (min) 43 min    Activity Tolerance Patient tolerated treatment well    Behavior During Therapy [facility] for tasks assessed/performed             Past Medical History:  Diagnosis Date   Anemia    Anxiety    BPH (benign prostatic hyperplasia)    Gastritis    GERD (gastroesophageal reflux disease)    "seldom" (07/16/2016)   GI bleed due to NSAIDs 10/27/2015   History of blood transfusion 06/2016   post OR/notes 07/15/2016   History of hiatal hernia    History of kidney stones    surgery to remove stone   Hyperlipidemia    Hypertension    no meds    Melanoma of back ([place])    "mid back"   Sigmoid diverticulitis    with perforation    Past Surgical History:  Procedure Laterality Date   COLON SURGERY     sigmoid   COLOSTOMY TAKEDOWN N/A 06/28/2016   Procedure: COLOSTOMY TAKEDOWN;  Surgeon: [name], MD;  Location: [facility];  Service: General;  Laterality: N/A;   CYSTOSCOPY WITH RETROGRADE PYELOGRAM, URETEROSCOPY AND STENT PLACEMENT Bilateral 02/12/2020   Procedure: CYSTOSCOPY WITH BILATERAL RETROGRADE PYELOGRAM,  AND LITHOPEXY;  Surgeon: Remi Haggard, MD;  Location: [facility];  Service: Urology;  Laterality: Bilateral;  1 HR   ESOPHAGOGASTRODUODENOSCOPY N/A 10/27/2015   Procedure: ESOPHAGOGASTRODUODENOSCOPY (EGD);  Surgeon: Clarene Essex, MD;  Location: [facility] ENDOSCOPY;  Service: Endoscopy;  Laterality: N/A;    ESOPHAGOGASTRODUODENOSCOPY N/A 10/29/2015   Procedure: ESOPHAGOGASTRODUODENOSCOPY (EGD);  Surgeon: Ronald Lobo, MD;  Location[facility]OSCOPY;  Service: Endoscopy;  Laterality: N/A;   ESOPHAGOGASTRODUODENOSCOPY N/A 10/30/2015   Procedure: ESOPHAGOGASTRODUODENOSCOPY (EGD);  Surgeon: Clarene Essex, MD;  Location: [facility] ENDOSCOPY;  Service: Endoscopy;  Laterality: N/A;   g tube discontinued  04/2020   per patient   [place]  11/21/2015   REDUCTION OF HIATAL HERNIA , REPAIR HIATAL HERNIA, RESECTION SMALL BOWEL WITH ANASTOMOSIS, PLACEMENT GASTROSTOMY TUBE, PLACEMENT DUODENOSTOMY TUBE (N/A)   HEMORRHOID BANDING  X 2   HERNIA REPAIR     HIATAL HERNIA REPAIR N/A 11/21/2015   Procedure: REDUCTION OF HIATAL HERNIA , REPAIR HIATAL HERNIA, RESECTION SMALL BOWEL WITH ANASTOMOSIS, PLACEMENT GASTROSTOMY TUBE, PLACEMENT DUODENOSTOMY TUBE;  Surgeon: Mickeal Skinner, MD;  Location: [place];  Service: General;  Laterality: N/A;   HOLMIUM LASER APPLICATION Right 22/04/5425   Procedure: HOLMIUM LASER APPLICATION;  Surgeon: Remi Haggard, MD;  Location: [facility];  Service: Urology;  Laterality: Right;   INCISIONAL HERNIA REPAIR  06/28/2016   open/notes 07/15/2016   INCISIONAL HERNIA REPAIR  03/17/2018   WITH MESH   INCISIONAL HERNIA REPAIR N/A 03/17/2018   Procedure: INCISIONAL HERNIA REPAIR WITH MESH;  Surgeon: [name], MD;  Location: [place];  Service: General;  Laterality: N/A;   [place] N/A  10/16/2020   Procedure: INCISIONAL HERNIA REPAIR WITH MESH;  Surgeon: [name], MD;  Location: [place];  Service: General;  Laterality: N/A;   INGUINAL HERNIA REPAIR Bilateral 09/28/2018   INGUINAL HERNIA REPAIR Bilateral 09/28/2018   Procedure: BILATERAL OPEN INGUINAL HERNIA REPAIR WITH MESH;  Surgeon: [name], MD;  Location: [place];  Service: General;  Laterality: Bilateral;  GENERAL AND TAP BLOCK   INSERTION OF MESH N/A 03/17/2018   Procedure: INSERTION OF MESH;   Surgeon: [name], MD;  Location: [place];  Service: General;  Laterality: N/A;   INSERTION OF MESH Bilateral 09/28/2018   Procedure: Insertion Of Mesh;  Surgeon: [name], MD;  Location: [place];  Service: General;  Laterality: Bilateral;   IR CM INJ ANY COLONIC TUBE W/FLUORO  02/04/2017   IR GUIDED DRAIN W CATHETER PLACEMENT  07/06/2016   /NOTES 07/15/2016   IR PATIENT EVAL TECH 0-60 MINS  06/28/2019   IR RADIOLOGIST EVAL & MGMT  07/27/2016   IR RADIOLOGIST EVAL & MGMT  08/17/2016   IR RADIOLOGIST EVAL & MGMT  08/26/2016   IR REPLACE G-TUBE SIMPLE WO FLUORO  07/28/2017   IR REPLACE G-TUBE SIMPLE WO FLUORO  01/31/2018   IR REPLACE G-TUBE SIMPLE WO FLUORO  10/16/2018   IR REPLACE G-TUBE SIMPLE WO FLUORO  03/05/2019   IR REPLACE G-TUBE SIMPLE WO FLUORO  08/22/2019   IR REPLACE G-TUBE SIMPLE WO FLUORO  01/08/2020   IR [place] GASTRO/COLONIC TUBE PERCUT W/FLUORO  08/04/2016   IR [place] GASTRO/COLONIC TUBE PERCUT W/FLUORO  01/14/2017   IR US GUIDE BX ASP/DRAIN  08/04/2016   KNEE CARTILAGE SURGERY Right 1971   "opened me up"   LAPAROTOMY N/A 07/05/2015   Procedure: PARTIAL SIGMOID COLECTOMY AND COLOSTOMY;  Surgeon: [name], MD;  Location: [place] OR;  Service: General;  Laterality: N/A;   MELANOMA EXCISION  2001   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   Subjective Assessment - 12/23/20 1526     Subjective Patient has been to the MD. His low back is huting more today. He will have an injection for his back but needs and MRI first.    Pertinent History extensive abdominal surgerys ( 8 surgeies total)     Limitations Standing;Walking    How long can you stand comfortably? pain increased with standing time    How long can you walk comfortably? limited distances    Diagnostic tests nothing sicne the operation    Patient Stated Goals to have less pain    Currently in Pain? Yes    Pain Score 4     Pain Location Back    Pain Orientation Right;Left    Pain Descriptors / Indicators Aching    Pain Type Chronic pain    Pain Onset More than a month ago    Pain Frequency Constant                               [facility] Adult PT Treatment/Exercise - 12/24/20 0001  Lumbar Exercises: Stretches   Lower Trunk Rotation Limitations x20      Lumbar Exercises: Machines for Strengthening   Other Lumbar Machine Exercise shoulder extension 3x10 10lbs; scap retraction 3x10 20lbs      Lumbar Exercises: Seated   Other Seated Lumbar Exercises LAQ x20 red; march red x20; hip abduction red x20      Manual Therapy   Manual Therapy Soft tissue mobilization;Manual Traction    Manual therapy comments skilled palpation of trigger points    Soft tissue mobilization to lumbar spine and mid thoriacic area    Manual Traction garde II and III LAD              Trigger Point Dry Needling - 12/24/20 0001     Consent Given? Yes    Muscles Treated Back/Hip Thoracic multifidi    Other Dry Needling 2 spots on each side of the thoriacic spine                   PT Education - 12/24/20 1735     Person(s) Educated Patient    Methods Explanation;Demonstration;Tactile cues;Verbal cues    Comprehension Verbalized understanding;Returned demonstration;Verbal cues required;Tactile cues required              PT Short Term Goals - 11/27/20 0926       PT SHORT TERM GOAL #1   Title Patient will be indepednent with basic HEP    Time 3    Period Weeks    Status New    Target Date 12/18/20      PT SHORT TERM GOAL #2   Title Patient will increase bilateral LE strength to 5/5     Time 3    Period Weeks    Status New    Target Date 12/18/20      PT SHORT TERM GOAL #3   Title Patient will sit for 30 minutes without haveing to shift off the left side    Time 3    Period Weeks    Status New    Target Date 12/18/20               PT Long Term Goals - 11/27/20 0928       PT LONG TERM GOAL #1   Title Patient will perform ADL's and IADL's without pain in his back    Time 6    Period Weeks    Status New    Target Date 01/08/21      PT LONG TERM GOAL #2   Title Patient will ambualte 3000' without increased pain    Time 6    Period Weeks    Status New    Target Date 01/08/21      PT LONG TERM GOAL #3   Title He will be able to be on feet for 30-45 min before pain starts    Time 6    Period Weeks    Status New    Target Date 01/08/21                   Plan - 12/24/20 1736     Clinical Impression Statement Patient will have a shot at some point soo. His pain is back in his lower back. Therapy continues to endourage him to do his exericses at home. We will continue with manual therapy and progress postural eand core exercises as tolerated.    Personal Factors and Comorbidities Comorbidity 3+    Comorbidities anxiety, hernia  repair,    Examination-Activity Limitations Stand;Stairs;Locomotion Level;Transfers    Examination-Participation Restrictions Community Activity;Shop;Occupation    Stability/Clinical Decision Making Evolving/Moderate complexity    Clinical Decision Making Moderate    Rehab Potential Good    PT Frequency 2x / week    PT Duration 6 weeks    PT Treatment/Interventions ADLs/Self Care Home Management;Electrical Stimulation;Iontophoresis 4mg /ml Dexamethasone;DME Instruction;Cryotherapy;Moist Heat;Gait training;Stair training;Functional mobility training;Therapeutic activities;Therapeutic exercise;Neuromuscular re-education;Patient/family education;Manual techniques;Passive range of motion;Taping    PT Next Visit Plan manual  therapy to lumbar spine;review HEP; review table core exercises.    PT Home Exercise Plan ccess Code: [email or web address]  URL: [email or web address]  Date: 11/27/2020  Prepared by: Carolyne Littles    Exercises  Supine Piriformis Stretch with Foot on Ground - 1 x daily - 7 x weekly - 1 sets - 3 reps - 20 hold  Supine Lower Trunk Rotation - 1 x daily - 7 x weekly - 3 sets - 10 reps  Shoulder extension with resistance - Neutral - 1 x daily - 7 x weekly - 3 sets - 10 reps  Scapular Retraction with Resistance - 1 x daily - 7 x weekly - 3 sets - 10 reps    Consulted and Agree with Plan of Care Patient             Patient will benefit from skilled therapeutic intervention in order to improve the following deficits and impairments:  Abnormal gait, Pain, Decreased strength, Decreased mobility, Decreased activity tolerance, Increased muscle spasms, Increased fascial restricitons, Decreased range of motion  Visit Diagnosis: Chronic bilateral low back pain without sciatica  Pain in thoracic spine  Cramp and spasm  Abnormal posture     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   Bilateral inguinal hernia 09/28/2018   Incisional hernia 03/17/2018   Trigger thumb, left thumb 01/19/2018   Trigger finger, left index finger 07/18/2017   Trigger finger, right ring finger 01/31/2017   Trigger finger of left thumb 01/03/2017   Trigger finger of right thumb 01/03/2017   Trigger index finger of left hand 01/03/2017   Trigger index finger of right hand 01/03/2017   Malnutrition of moderate degree 07/19/2016   Intra-abdominal abscess ([place]) 07/16/2016   Cellulitis 07/15/2016   S/P colostomy takedown 06/28/2016   Pain in thoracic spine 06/09/2016   Mid back pain 06/09/2016   Postoperative fever 11/19/2015   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 [place], [name], [address] Phone: [phone]   Fax:  [phone]  Name: RYLEIGH BUENGER MRN: [id number] Date of Birth: 08/15/1950

## 2020-12-25 ENCOUNTER — Other Ambulatory Visit: Payer: Self-pay

## 2020-12-25 ENCOUNTER — Telehealth: Payer: Self-pay | Admitting: Orthopaedic Surgery

## 2020-12-25 ENCOUNTER — Ambulatory Visit (HOSPITAL_BASED_OUTPATIENT_CLINIC_OR_DEPARTMENT_OTHER): Payer: Medicare Other | Admitting: Physical Therapy

## 2020-12-25 ENCOUNTER — Ambulatory Visit (HOSPITAL_COMMUNITY)
Admission: RE | Admit: 2020-12-25 | Discharge: 2020-12-25 | Disposition: A | Discharge status: Home / Self Care | Payer: Medicare Other | Source: Ambulatory Visit | Attending: Orthopaedic Surgery | Admitting: Orthopaedic Surgery

## 2020-12-25 DIAGNOSIS — R252 Cramp and spasm: Secondary | ICD-10-CM | POA: Diagnosis not present

## 2020-12-25 DIAGNOSIS — G8929 Other chronic pain: Secondary | ICD-10-CM | POA: Insufficient documentation

## 2020-12-25 DIAGNOSIS — M545 Low back pain, unspecified: Secondary | ICD-10-CM

## 2020-12-25 DIAGNOSIS — M5442 Lumbago with sciatica, left side: Secondary | ICD-10-CM | POA: Insufficient documentation

## 2020-12-25 DIAGNOSIS — M546 Pain in thoracic spine: Secondary | ICD-10-CM | POA: Diagnosis not present

## 2020-12-25 DIAGNOSIS — M47816 Spondylosis without myelopathy or radiculopathy, lumbar region: Secondary | ICD-10-CM | POA: Diagnosis not present

## 2020-12-25 DIAGNOSIS — R293 Abnormal posture: Secondary | ICD-10-CM

## 2020-12-26 ENCOUNTER — Encounter (HOSPITAL_BASED_OUTPATIENT_CLINIC_OR_DEPARTMENT_OTHER): Payer: Self-pay | Admitting: Physical Therapy

## 2020-12-26 NOTE — Therapy (Addendum)
[facility] [address] [place], [name], [address] Phone: [phone]   Fax:  [phone]  Physical Therapy Treatment/Discharge   Patient Details  Name: Miguel [name] MRN: [id number] Date of Birth: [birth year] Referring Provider (PT): Dr Jean Rosenthal   Encounter Date: 12/25/2020   PT End of Session - 12/26/20 1321     Visit Number 8    Number of Visits 12    Date for PT Re-Evaluation 01/07/21    Authorization Type MCR A and B    PT Start Time 1230    PT Stop Time 1315    PT Time Calculation (min) 45 min    Activity Tolerance Patient tolerated treatment well    Behavior During Therapy [facility] for tasks assessed/performed             Past Medical History:  Diagnosis Date   Anemia    Anxiety    BPH (benign prostatic hyperplasia)    Gastritis    GERD (gastroesophageal reflux disease)    "seldom" (07/16/2016)   GI bleed due to NSAIDs 10/27/2015   History of blood transfusion 06/2016   post OR/notes 07/15/2016   History of hiatal hernia    History of kidney stones    surgery to remove stone   Hyperlipidemia    Hypertension    no meds    Melanoma of back ([place])    "mid back"   Sigmoid diverticulitis    with perforation    Past Surgical History:  Procedure Laterality Date   COLON SURGERY     sigmoid   COLOSTOMY TAKEDOWN N/A 06/28/2016   Procedure: COLOSTOMY TAKEDOWN;  Surgeon: [name], MD;  Location: [facility];  Service: General;  Laterality: N/A;   CYSTOSCOPY WITH RETROGRADE PYELOGRAM, URETEROSCOPY AND STENT PLACEMENT Bilateral 02/12/2020   Procedure: CYSTOSCOPY WITH BILATERAL RETROGRADE PYELOGRAM,  AND LITHOPEXY;  Surgeon: Remi Haggard, MD;  Location: [facility];  Service: Urology;  Laterality: Bilateral;  1 HR   ESOPHAGOGASTRODUODENOSCOPY N/A 10/27/2015   Procedure: ESOPHAGOGASTRODUODENOSCOPY (EGD);  Surgeon: Clarene Essex, MD;  Location: [facility] ENDOSCOPY;  Service: Endoscopy;  Laterality: N/A;    ESOPHAGOGASTRODUODENOSCOPY N/A 10/29/2015   Procedure: ESOPHAGOGASTRODUODENOSCOPY (EGD);  Surgeon: Ronald Lobo, MD;  Location: [facility] ENDOSCOPY;  Service: Endoscopy;  Laterality: N/A;   ESOPHAGOGASTRODUODENOSCOPY N/A 10/30/2015   Procedure: ESOPHAGOGASTRODUODENOSCOPY (EGD);  Surgeon: Clarene Essex, MD;  Location: [facility] ENDOSCOPY;  Service: Endoscopy;  Laterality: N/A;   g tube discontinued  04/2020   per patient   [place]  11/21/2015   REDUCTION OF HIATAL HERNIA , REPAIR HIATAL HERNIA, RESECTION SMALL BOWEL WITH ANASTOMOSIS, PLACEMENT GASTROSTOMY TUBE, PLACEMENT DUODENOSTOMY TUBE (N/A)   HEMORRHOID BANDING  X 2   HERNIA REPAIR     HIATAL HERNIA REPAIR N/A 11/21/2015   Procedure: REDUCTION OF HIATAL HERNIA , REPAIR HIATAL HERNIA, RESECTION SMALL BOWEL WITH ANASTOMOSIS, PLACEMENT GASTROSTOMY TUBE, PLACEMENT DUODENOSTOMY TUBE;  Surgeon: Mickeal Skinner, MD;  Location: [facility];  Service: General;  Laterality: N/A;   HOLMIUM LASER APPLICATION Right 85/63/1497   Procedure: HOLMIUM LASER APPLICATION;  Surgeon: Remi Haggard, MD;  Location: [facility];  Service: Urology;  Laterality: Right;   INCISIONAL HERNIA REPAIR  06/28/2016   open/notes 07/15/2016   INCISIONAL HERNIA REPAIR  03/17/2018   WITH MESH   INCISIONAL HERNIA REPAIR N/A 03/17/2018   Procedure: INCISIONAL HERNIA REPAIR WITH MESH;  Surgeon: [name], MD;  Location: [place];  Service: General;  Laterality: N/A;   [place]  N/A 10/16/2020   Procedure: INCISIONAL HERNIA REPAIR WITH MESH;  Surgeon: [name], MD;  Location: [facility];  Service: General;  Laterality: N/A;   INGUINAL HERNIA REPAIR Bilateral 09/28/2018   INGUINAL HERNIA REPAIR Bilateral 09/28/2018   Procedure: BILATERAL OPEN INGUINAL HERNIA REPAIR WITH MESH;  Surgeon: [name], MD;  Location: [place];  Service: General;  Laterality: Bilateral;  GENERAL AND TAP BLOCK   INSERTION OF MESH N/A 03/17/2018   Procedure: INSERTION OF MESH;   Surgeon: [name], MD;  Location: [facility];  Service: General;  Laterality: N/A;   INSERTION OF MESH Bilateral 09/28/2018   Procedure: Insertion Of Mesh;  Surgeon: [name], MD;  Location: [place];  Service: General;  Laterality: Bilateral;   IR CM INJ ANY COLONIC TUBE W/FLUORO  02/04/2017   IR GUIDED DRAIN W CATHETER PLACEMENT  07/06/2016   /NOTES 07/15/2016   IR PATIENT EVAL TECH 0-60 MINS  06/28/2019   IR RADIOLOGIST EVAL & MGMT  07/27/2016   IR RADIOLOGIST EVAL & MGMT  08/17/2016   IR RADIOLOGIST EVAL & MGMT  08/26/2016   IR REPLACE G-TUBE SIMPLE WO FLUORO  07/28/2017   IR REPLACE G-TUBE SIMPLE WO FLUORO  01/31/2018   IR REPLACE G-TUBE SIMPLE WO FLUORO  10/16/2018   IR REPLACE G-TUBE SIMPLE WO FLUORO  03/05/2019   IR REPLACE G-TUBE SIMPLE WO FLUORO  08/22/2019   IR REPLACE G-TUBE SIMPLE WO FLUORO  01/08/2020   IR [place] GASTRO/COLONIC TUBE PERCUT W/FLUORO  08/04/2016   IR [place] GASTRO/COLONIC TUBE PERCUT W/FLUORO  01/14/2017   IR US GUIDE BX ASP/DRAIN  08/04/2016   KNEE CARTILAGE SURGERY Right 1971   "opened me up"   LAPAROTOMY N/A 07/05/2015   Procedure: PARTIAL SIGMOID COLECTOMY AND COLOSTOMY;  Surgeon: [name], MD;  Location: [place] OR;  Service: General;  Laterality: N/A;   MELANOMA EXCISION  2001   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   Subjective Assessment - 12/26/20 1248     Subjective Patient reports his lower back pain is better. He is having some mid back pain today. He feels like the needling has helped.    Pertinent History extensive abdominal surgerys ( 8 surgeies total)    Limitations  Standing;Walking    How long can you stand comfortably? pain increased with standing time    How long can you walk comfortably? limited distances    Diagnostic tests nothing sicne the operation    Patient Stated Goals to have less pain    Currently in Pain? No/denies                               [facility] Adult PT Treatment/Exercise - 12/26/20 0001       Self-Care   Self-Care Other Self-Care Comments    Other Self-Care Comments  revied use of thera-cane and where to purchase. How to ideniifiy trigger points.      Lumbar Exercises: Standing   Other Standing Lumbar Exercises scap retraction x20 red; shoulder  extension x20 red      Lumbar Exercises: Seated   Other Seated Lumbar Exercises bilateral er red 2x10; horizontal abdcution 2x10 red; shoulder flexion 2x10 red      Manual Therapy   Manual Therapy Soft tissue mobilization;Manual Traction    Manual therapy comments skilled palpation of trigger points    Soft tissue mobilization to lumbar spine and mid thoriacic area    Manual Traction garde II and III LAD                     PT Education - 12/26/20 1259     Education Details reviewed use of thera-cane    Person(s) Educated Patient    Methods Explanation;Demonstration;Verbal cues;Tactile cues    Comprehension Returned demonstration;Verbalized understanding;Verbal cues required;Tactile cues required              PT Short Term Goals - 11/27/20 0926       PT SHORT TERM GOAL #1   Title Patient will be indepednent with basic HEP    Time 3    Period Weeks    Status New    Target Date 12/18/20      PT SHORT TERM GOAL #2   Title Patient will increase bilateral LE strength to 5/5    Time 3    Period Weeks    Status New    Target Date 12/18/20      PT SHORT TERM GOAL #3   Title Patient will sit for 30 minutes without haveing to shift off the left side    Time 3    Period Weeks    Status New    Target Date 12/18/20                PT Long Term Goals - 11/27/20 0928       PT LONG TERM GOAL #1   Title Patient will perform ADL's and IADL's without pain in his back    Time 6    Period Weeks    Status New    Target Date 01/08/21      PT LONG TERM GOAL #2   Title Patient will ambualte 3000' without increased pain    Time 6    Period Weeks    Status New    Target Date 01/08/21      PT LONG TERM GOAL #3   Title He will be able to be on feet for 30-45 min before pain starts    Time 6    Period Weeks    Status New    Target Date 01/08/21                   Plan - 12/26/20 1329     Clinical Impression Statement Patient reported decreased pain after treatment. Just with basic palpation, he has cavitations in his mid thoriacic spine. He reports relief with these cavitations. We continue to encourage him to work on his exercises at home. He was shown the thera-cane. He is having difficulty using the tennis ball. Therapy showed him how to use the thera-cane for trigger point release. He liked it better then the tennis ball.    Personal Factors and Comorbidities Comorbidity 3+    Comorbidities anxiety, hernia repair,    Examination-Activity Limitations Stand;Stairs;Locomotion Level;Transfers    Stability/Clinical Decision Making Evolving/Moderate complexity    Clinical Decision Making Moderate    Rehab Potential Good    PT Frequency 2x / week    PT  Duration 6 weeks    PT Treatment/Interventions ADLs/Self Care Home Management;Electrical Stimulation;Iontophoresis 2m/ml Dexamethasone;DME Instruction;Cryotherapy;Moist Heat;Gait training;Stair training;Functional mobility training;Therapeutic activities;Therapeutic exercise;Neuromuscular re-education;Patient/family education;Manual techniques;Passive range of motion;Taping    PT Next Visit Plan manual therapy to lumbar spine;review HEP; review table core exercises.    PT Home Exercise Plan ccess Code: [email or web address] URL: [email or web address]   Date: 11/27/2020  Prepared by: DCarolyne Littles   Exercises  Supine Piriformis Stretch with Foot on Ground - 1 x daily - 7 x weekly - 1 sets - 3 reps - 20 hold  Supine Lower Trunk Rotation - 1 x daily - 7 x weekly - 3 sets - 10 reps  Shoulder extension with resistance - Neutral - 1 x daily - 7 x weekly - 3 sets - 10 reps  Scapular Retraction with Resistance - 1 x daily - 7 x weekly - 3 sets - 10 reps    Consulted and Agree with Plan of Care Patient             Patient will benefit from skilled therapeutic intervention in order to improve the following deficits and impairments:  Abnormal gait, Pain, Decreased strength, Decreased mobility, Decreased activity tolerance, Increased muscle spasms, Increased fascial restricitons, Decreased range of motion  Visit Diagnosis: Chronic bilateral low back pain without sciatica  Pain in thoracic spine  Cramp and spasm  Abnormal posture  PHYSICAL THERAPY DISCHARGE SUMMARY  Visits from Start of Care: 8  Current functional level related to goals / functional outcomes: Improved pain and spine mobility    Remaining deficits: Continued pain with activity    Education / Equipment: HEP   Patient agrees to discharge. Patient goals were partially met. Patient is being discharged due to maximized rehab potential.     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   Bilateral inguinal hernia 09/28/2018   Incisional hernia 03/17/2018   Trigger thumb, left thumb 01/19/2018   Trigger finger, left index finger 07/18/2017   Trigger finger, right ring finger 01/31/2017   Trigger finger of left thumb 01/03/2017   Trigger finger of right thumb 01/03/2017   Trigger index finger of left hand 01/03/2017   Trigger index finger of right hand 01/03/2017   Malnutrition of moderate degree 07/19/2016   Intra-abdominal abscess ([facility] 07/16/2016   Cellulitis  07/15/2016   S/P colostomy takedown 06/28/2016   Pain in thoracic spine 06/09/2016   Mid back pain 06/09/2016   Postoperative fever 11/19/2015   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 [address]Phone: [phone]  Fax:  [phone] Name: JMAXI RODASMRN: [id number] of Birth: [birth year]

## 2020-12-29 ENCOUNTER — Ambulatory Visit (INDEPENDENT_AMBULATORY_CARE_PROVIDER_SITE_OTHER): Payer: Medicare Other | Admitting: Orthopaedic Surgery

## 2020-12-29 ENCOUNTER — Encounter: Payer: Self-pay | Admitting: Orthopaedic Surgery

## 2020-12-29 DIAGNOSIS — G8929 Other chronic pain: Secondary | ICD-10-CM | POA: Diagnosis not present

## 2020-12-29 DIAGNOSIS — M5442 Lumbago with sciatica, left side: Secondary | ICD-10-CM

## 2020-12-29 NOTE — Progress Notes (Signed)
The patient comes in today to go over the MRI of his lumbar spine.  He is still experiencing left-sided low back pain that radiates from the mid low back to left into his abdomen.  He has had multiple abdominal surgeries.  He is going to physical therapy withdrawal bridge and that is helped some.  He still has the pain had been made upper lumbar spine area that radiates to the left side.  There is a pinpoint area where he certainly hurts.  I inspected the area and there is no rash.  I went over the MRI findings with him.  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  This seems to be at L2-L3 in terms of where it correlates with his symptoms.  From my standpoint we can certainly send him to Dr. Ernestina Patches to consider a left-sided L2-L3 injection based on the MRI findings.  Certainly I will also have Dr. Ernestina Patches take a look at them to determine if this is the best level to try to help with the patient's symptoms.  His lower extremity exams are normal bilaterally in terms of motor and sensory function.  I will see him back myself in about 4 weeks to see how he is doing overall.  All questions and concerns were answered and addressed.

## 2020-12-30 ENCOUNTER — Other Ambulatory Visit: Payer: Self-pay

## 2020-12-30 DIAGNOSIS — G8929 Other chronic pain: Secondary | ICD-10-CM

## 2020-12-30 DIAGNOSIS — M5442 Lumbago with sciatica, left side: Secondary | ICD-10-CM

## 2021-01-19 ENCOUNTER — Ambulatory Visit: Payer: Medicare Other | Admitting: Physical Medicine and Rehabilitation

## 2021-01-22 ENCOUNTER — Ambulatory Visit (INDEPENDENT_AMBULATORY_CARE_PROVIDER_SITE_OTHER): Payer: Medicare Other | Admitting: Physical Medicine and Rehabilitation

## 2021-01-22 ENCOUNTER — Encounter: Payer: Self-pay | Admitting: Physical Medicine and Rehabilitation

## 2021-01-22 ENCOUNTER — Other Ambulatory Visit: Payer: Self-pay

## 2021-01-22 ENCOUNTER — Ambulatory Visit: Payer: Self-pay

## 2021-01-22 VITALS — BP 161/96 | HR 87

## 2021-01-22 DIAGNOSIS — M5416 Radiculopathy, lumbar region: Secondary | ICD-10-CM | POA: Diagnosis not present

## 2021-01-22 MED ORDER — BETAMETHASONE SOD PHOS & ACET 6 (3-3) MG/ML IJ SUSP: 12.0000 mg | Freq: Once | INTRAMUSCULAR | Status: DC

## 2021-01-22 MED ORDER — DEXAMETHASONE SODIUM PHOSPHATE 10 MG/ML IJ SOLN
15.0000 mg | Freq: Once | INTRAMUSCULAR | Status: AC
  Administered 2021-01-22: 15 mg

## 2021-01-22 NOTE — Patient Instructions (Signed)

## 2021-01-22 NOTE — Progress Notes (Signed)
Pt state mid back down to his lower back. Pt state standing makes the pain worse. Pt state he takes over the counter pain meds to help ease pain.  Numeric Pain Rating Scale and Functional Assessment Average Pain 6   In the last MONTH (on 0-10 scale) has pain interfered with the following?  1. General activity like being  able to carry out your everyday physical activities such as walking, climbing stairs, carrying groceries, or moving a chair?  Rating(8)   +Driver, -BT, -Dye Allergies.

## 2021-01-26 ENCOUNTER — Other Ambulatory Visit: Payer: Self-pay

## 2021-01-26 ENCOUNTER — Ambulatory Visit (INDEPENDENT_AMBULATORY_CARE_PROVIDER_SITE_OTHER): Payer: Medicare Other | Admitting: Orthopaedic Surgery

## 2021-01-26 ENCOUNTER — Encounter: Payer: Self-pay | Admitting: Orthopaedic Surgery

## 2021-01-26 DIAGNOSIS — G8929 Other chronic pain: Secondary | ICD-10-CM | POA: Diagnosis not present

## 2021-01-26 DIAGNOSIS — M5442 Lumbago with sciatica, left side: Secondary | ICD-10-CM

## 2021-01-26 NOTE — Progress Notes (Signed)
The patient is only 4 days into an epidural steroid injection to the left-sided L4-L5 by Dr. Ernestina Patches.  This was just done and the patient reports that he is already feeling better.  He still having some upper lumbar spine pain in the midline and is did state that Dr. Ernestina Patches could see him for that eventually if that was bothering him enough.  He still seems to have a positive straight leg raise on the left side but he looks much more comfortable overall.  He is a cachectic individual and he is certainly hurting in the upper lumbar spine.  Right now would not do anything about that given its only been 4 days since the most recent epidural steroid injection.  He will still increase his activities as comfort allows.  We will see him back in 4 weeks to see how he is doing overall and to determine whether or not we would send him back to [place] then to consider other injections.

## 2021-01-28 NOTE — Procedures (Signed)
Lumbosacral Transforaminal Epidural Steroid Injection - Sub-Pedicular Approach with Fluoroscopic Guidance  Patient: Miguel [name]      Date of Birth: 06-18-1950 MRN: [id number] PCP: Alroy Dust, L.Marlou Sa, MD      Visit Date: 01/22/2021   Universal Protocol:    Date/Time: 01/22/2021  Consent Given By: the patient  Position: PRONE  Additional Comments: Vital signs were monitored before and after the procedure. Patient was prepped and draped in the usual sterile fashion. The correct patient, procedure, and site was verified.   Injection Procedure Details:   Procedure diagnoses: Lumbar radiculopathy [M54.16]    Meds Administered:  Meds ordered this encounter  Medications   DISCONTD: betamethasone acetate-betamethasone sodium phosphate (CELESTONE) injection 12 mg   dexamethasone (DECADRON) injection 15 mg    Laterality: Left  Location/Site: L2  Needle:5.0 in., 22 ga.  Short bevel or Quincke spinal needle  Needle Placement: Transforaminal  Findings:    -Comments: Excellent flow of contrast along the nerve, nerve root and into the epidural space.  Procedure Details: After squaring off the end-plates to get a true AP view, the C-arm was positioned so that an oblique view of the foramen as noted above was visualized. The target area is just inferior to the "nose of the scotty dog" or sub pedicular. The soft tissues overlying this structure were infiltrated with 2-3 ml. of 1% Lidocaine without Epinephrine.  The spinal needle was inserted toward the target using a "trajectory" view along the fluoroscope beam.  Under AP and lateral visualization, the needle was advanced so it did not puncture dura and was located close the 6 O'Clock position of the pedical in AP tracterory. Biplanar projections were used to confirm position. Aspiration was confirmed to be negative for CSF and/or blood. A 1-2 ml. volume of Isovue-250 was injected and flow of contrast was noted at each level. Radiographs  were obtained for documentation purposes.   After attaining the desired flow of contrast documented above, a 0.5 to 1.0 ml test dose of 0.25% Marcaine was injected into each respective transforaminal space.  The patient was observed for 90 seconds post injection.  After no sensory deficits were reported, and normal lower extremity motor function was noted,   the above injectate was administered so that equal amounts of the injectate were placed at each foramen (level) into the transforaminal epidural space.   Additional Comments:  The patient tolerated the procedure well Dressing: 2 x 2 sterile gauze and Band-Aid    Post-procedure details: Patient was observed during the procedure. Post-procedure instructions were reviewed.  Patient left the clinic in stable condition.

## 2021-01-28 NOTE — Progress Notes (Signed)
Miguel [name] - 70 y.o. male MRN [id number]  Date of birth: [birth year]  Office Visit Note: Visit Date: 01/22/2021 PCP: Alroy Dust, L.Marlou Sa, MD Referred by: Alroy Dust, L.Marlou Sa, MD  Subjective: Chief Complaint  Patient presents with   Middle Back - Pain   Lower Back - Pain   HPI:  Miguel [name] is a 70 y.o. male who comes in today at the request of Dr. Jean Rosenthal for planned Left L2-3 Lumbar Transforaminal epidural steroid injection with fluoroscopic guidance.  The patient has failed conservative care including home exercise, medications, time and activity modification.  This injection will be diagnostic and hopefully therapeutic.  Please see requesting physician notes for further details and justification. MRI reviewed with images and spine model.  MRI reviewed in the note below.    ROS Otherwise per HPI.  Assessment & Plan: Visit Diagnoses:    ICD-10-CM   1. Lumbar radiculopathy  M54.16 XR C-ARM NO REPORT    Epidural Steroid injection    dexamethasone (DECADRON) injection 15 mg    DISCONTINUED: betamethasone acetate-betamethasone sodium phosphate (CELESTONE) injection 12 mg      Plan: No additional findings.   Meds & Orders:  Meds ordered this encounter  Medications   DISCONTD: betamethasone acetate-betamethasone sodium phosphate (CELESTONE) injection 12 mg   dexamethasone (DECADRON) injection 15 mg    Orders Placed This Encounter  Procedures   XR C-ARM NO REPORT   Epidural Steroid injection    Follow-up: Return if symptoms worsen or fail to improve.   Procedures: No procedures performed  Lumbosacral Transforaminal Epidural Steroid Injection - Sub-Pedicular Approach with Fluoroscopic Guidance  Patient: Miguel [name]      Date of Birth: 12-09-1950 MRN: [id number] PCP: Alroy Dust, L.Marlou Sa, MD      Visit Date: 01/22/2021   Universal Protocol:    Date/Time: 01/22/2021  Consent Given By: the patient  Position: PRONE  Additional Comments: Vital signs were  monitored before and after the procedure. Patient was prepped and draped in the usual sterile fashion. The correct patient, procedure, and site was verified.   Injection Procedure Details:   Procedure diagnoses: Lumbar radiculopathy [M54.16]    Meds Administered:  Meds ordered this encounter  Medications   DISCONTD: betamethasone acetate-betamethasone sodium phosphate (CELESTONE) injection 12 mg   dexamethasone (DECADRON) injection 15 mg    Laterality: Left  Location/Site: L2  Needle:5.0 in., 22 ga.  Short bevel or Quincke spinal needle  Needle Placement: Transforaminal  Findings:    -Comments: Excellent flow of contrast along the nerve, nerve root and into the epidural space.  Procedure Details: After squaring off the end-plates to get a true AP view, the C-arm was positioned so that an oblique view of the foramen as noted above was visualized. The target area is just inferior to the "nose of the scotty dog" or sub pedicular. The soft tissues overlying this structure were infiltrated with 2-3 ml. of 1% Lidocaine without Epinephrine.  The spinal needle was inserted toward the target using a "trajectory" view along the fluoroscope beam.  Under AP and lateral visualization, the needle was advanced so it did not puncture dura and was located close the 6 O'Clock position of the pedical in AP tracterory. Biplanar projections were used to confirm position. Aspiration was confirmed to be negative for CSF and/or blood. A 1-2 ml. volume of Isovue-250 was injected and flow of contrast was noted at each level. Radiographs were obtained for documentation purposes.   After attaining the desired flow of contrast  documented above, a 0.5 to 1.0 ml test dose of 0.25% Marcaine was injected into each respective transforaminal space.  The patient was observed for 90 seconds post injection.  After no sensory deficits were reported, and normal lower extremity motor function was noted,   the above  injectate was administered so that equal amounts of the injectate were placed at each foramen (level) into the transforaminal epidural space.   Additional Comments:  The patient tolerated the procedure well Dressing: 2 x 2 sterile gauze and Band-Aid    Post-procedure details: Patient was observed during the procedure. Post-procedure instructions were reviewed.  Patient left the clinic in stable condition.     Clinical History: MRI LUMBAR SPINE WITHOUT CONTRAST   TECHNIQUE: Multiplanar, multisequence MR imaging of the lumbar spine was performed. No intravenous contrast was administered.   COMPARISON:  Lumbar spine radiographs 03/11/2020   FINDINGS: Segmentation: 5 non rib-bearing lumbar type vertebral bodies are present. The lowest fully formed vertebral body is L5.   Alignment: Slight retrolisthesis is present at T12-L1, L1-2 and L2-3. Lumbar lordosis is preserved.   Vertebrae:  Marrow signal and vertebral body heights are normal.   Conus medullaris and cauda equina: Conus extends to the L1-2 level. Conus and cauda equina appear normal.   Paraspinal and other soft tissues: Layering gallstones noted. No inflammatory change to suggest cholecystitis. 11 mm simple cyst noted posteriorly in the right kidney. No other solid organ lesions are present. No significant adenopathy is present.   Disc levels:   T12-L1: Mild disc bulging without significant stenosis.   L1-2: Retrolisthesis and uncovering of the disc contributes to mild foraminal narrowing bilaterally.   L2-3: Retrolisthesis and uncovering of a broad-based disc protrusion contribute to mild foraminal narrowing bilaterally, left greater than right.   L3-4: Broad-based disc protrusion extends into the foramina. Moderate facet hypertrophy is noted bilaterally. Mild central and bilateral foraminal narrowing is present.   L4-5: A broad-based disc protrusion is present. Moderate facet hypertrophy is noted. Mild  central and mild to moderate foraminal narrowing is worse right than left.   L5-S1: Facet and endplate spurring contribute to moderate left and mild right foraminal narrowing.   IMPRESSION: 1. Multilevel spondylosis of the lumbar spine as described. 2. Mild foraminal narrowing bilaterally at L1-2 and L2-3 is worse on the left. Slight retrolisthesis and uncovering of a broad-based disc protrusion contribute at both levels. 3. Mild central and bilateral foraminal narrowing at L3-4 secondary to a broad-based disc protrusion and facet hypertrophy. 4. Mild central and mild to moderate foraminal narrowing bilaterally at L4-5 is worse right than left. 5. Moderate left and mild right foraminal stenosis at L5-S1 due to endplate and facet spurring. 6. Cholelithiasis without evidence for cholecystitis.     Electronically Signed   By: San Morelle M.D.   On: 12/28/2020 07:23     Objective:  VS:  HT:    WT:   BMI:     BP:(!) 161/96  HR:87bpm  TEMP: ( )  RESP:  Physical Exam Vitals and nursing note reviewed.  Constitutional:      General: He is not in acute distress.    Appearance: Normal appearance. He is not ill-appearing.  HENT:     Head: Normocephalic and atraumatic.     Right Ear: External ear normal.     Left Ear: External ear normal.     Nose: No congestion.  Eyes:     Extraocular Movements: Extraocular movements intact.  Cardiovascular:  Rate and Rhythm: Normal rate.     Pulses: Normal pulses.  Pulmonary:     Effort: Pulmonary effort is normal. No respiratory distress.  Abdominal:     General: There is no distension.     Palpations: Abdomen is soft.  Musculoskeletal:        General: No tenderness or signs of injury.     Cervical back: Neck supple.     Right lower leg: No edema.     Left lower leg: No edema.     Comments: Patient has good distal strength without clonus.  Skin:    Findings: No erythema or rash.  Neurological:     General: No focal  deficit present.     Mental Status: He is alert and oriented to person, place, and time.     Sensory: No sensory deficit.     Motor: No weakness or abnormal muscle tone.     Coordination: Coordination normal.  Psychiatric:        Mood and Affect: Mood normal.        Behavior: Behavior normal.     Imaging: No results found.

## 2021-02-01 DIAGNOSIS — Z20828 Contact with and (suspected) exposure to other viral communicable diseases: Secondary | ICD-10-CM | POA: Diagnosis not present

## 2021-02-02 DIAGNOSIS — R972 Elevated prostate specific antigen [PSA]: Secondary | ICD-10-CM | POA: Diagnosis not present

## 2021-02-09 DIAGNOSIS — G4700 Insomnia, unspecified: Secondary | ICD-10-CM | POA: Diagnosis not present

## 2021-02-09 DIAGNOSIS — R972 Elevated prostate specific antigen [PSA]: Secondary | ICD-10-CM | POA: Diagnosis not present

## 2021-02-09 DIAGNOSIS — Z23 Encounter for immunization: Secondary | ICD-10-CM | POA: Diagnosis not present

## 2021-02-09 DIAGNOSIS — M545 Low back pain, unspecified: Secondary | ICD-10-CM | POA: Diagnosis not present

## 2021-02-09 DIAGNOSIS — E7800 Pure hypercholesterolemia, unspecified: Secondary | ICD-10-CM | POA: Diagnosis not present

## 2021-02-09 DIAGNOSIS — I10 Essential (primary) hypertension: Secondary | ICD-10-CM | POA: Diagnosis not present

## 2021-02-09 DIAGNOSIS — N400 Enlarged prostate without lower urinary tract symptoms: Secondary | ICD-10-CM | POA: Diagnosis not present

## 2021-02-18 DIAGNOSIS — Z23 Encounter for immunization: Secondary | ICD-10-CM | POA: Diagnosis not present

## 2021-02-23 ENCOUNTER — Ambulatory Visit (INDEPENDENT_AMBULATORY_CARE_PROVIDER_SITE_OTHER): Payer: Medicare Other | Admitting: Orthopaedic Surgery

## 2021-02-23 ENCOUNTER — Other Ambulatory Visit: Payer: Self-pay

## 2021-02-23 ENCOUNTER — Encounter: Payer: Self-pay | Admitting: Orthopaedic Surgery

## 2021-02-23 DIAGNOSIS — G8929 Other chronic pain: Secondary | ICD-10-CM

## 2021-02-23 DIAGNOSIS — M5442 Lumbago with sciatica, left side: Secondary | ICD-10-CM

## 2021-02-23 NOTE — Progress Notes (Signed)
The patient is well-known to me.  He is a 70 year old gentleman who is very cachectic.  He had an epidural steroid injection to the left side at L2 by Dr. Ernestina Patches on 01/19/2021.  I saw him 4 days after that injection he said the injection worked great with radicular symptoms going down his left leg.  Today he still is pain-free from those injections but still has mid lumbar spine pain and he states that Dr. Ernestina Patches told him that he would be able to have injections they could help with that type of pain.  I told him I agree and showed him a back model and explained that I think Dr. Ernestina Patches is talked about facet joint injections.  On exam he does hurt in the mid lumbar spine area and is just to the right and left of the midline and is seems to be more facet joint related.  The MRI of his lumbar spine did show moderate facet hypertrophy at L3-L4.  We will work on setting him up from Dr. Ernestina Patches to have facet joint injections at L3-L4 bilaterally.

## 2021-03-16 ENCOUNTER — Encounter: Payer: Self-pay | Admitting: Physical Medicine and Rehabilitation

## 2021-03-16 ENCOUNTER — Ambulatory Visit: Payer: Medicare Other | Admitting: Physical Medicine and Rehabilitation

## 2021-03-16 ENCOUNTER — Other Ambulatory Visit: Payer: Self-pay

## 2021-03-16 ENCOUNTER — Ambulatory Visit (INDEPENDENT_AMBULATORY_CARE_PROVIDER_SITE_OTHER): Payer: Medicare Other | Admitting: Physical Medicine and Rehabilitation

## 2021-03-16 ENCOUNTER — Ambulatory Visit: Payer: Self-pay

## 2021-03-16 VITALS — BP 167/94 | HR 97

## 2021-03-16 DIAGNOSIS — M7918 Myalgia, other site: Secondary | ICD-10-CM

## 2021-03-16 DIAGNOSIS — G8929 Other chronic pain: Secondary | ICD-10-CM

## 2021-03-16 DIAGNOSIS — M546 Pain in thoracic spine: Secondary | ICD-10-CM

## 2021-03-16 DIAGNOSIS — M4014 Other secondary kyphosis, thoracic region: Secondary | ICD-10-CM

## 2021-03-16 DIAGNOSIS — M47816 Spondylosis without myelopathy or radiculopathy, lumbar region: Secondary | ICD-10-CM

## 2021-03-16 MED ORDER — METHYLPREDNISOLONE ACETATE 80 MG/ML IJ SUSP: 80.0000 mg | Freq: Once | INTRAMUSCULAR | Status: DC

## 2021-03-16 NOTE — Progress Notes (Signed)
Pt state lower back pain that travels to his left buttocks. Pt state standing and lifting items makes the pain worse. Pt state he takes pain meds to help ease his pain. Pt has hx of inj on 01/22/21 pt state it helped with 75% relief.  Numeric Pain Rating Scale and Functional Assessment Average Pain 7   In the last MONTH (on 0-10 scale) has pain interfered with the following?  1. General activity like being  able to carry out your everyday physical activities such as walking, climbing stairs, carrying groceries, or moving a chair?  Rating(10)   +Driver, -BT, -Dye Allergies.

## 2021-03-16 NOTE — Patient Instructions (Signed)

## 2021-04-07 ENCOUNTER — Telehealth: Payer: Self-pay | Admitting: Physical Medicine and Rehabilitation

## 2021-04-07 NOTE — Telephone Encounter (Signed)
Pt called stating he got 2 injections around christmas and was told to call back to let Dr.Newton know how they worked. Pt states the injection he got on the R side has helped a lot but he L side is still giving him some problems.   [phone]

## 2021-04-08 ENCOUNTER — Telehealth: Payer: Self-pay | Admitting: Physical Medicine and Rehabilitation

## 2021-04-08 NOTE — Telephone Encounter (Signed)
Patient  returned call asked for a call back    [phone]

## 2021-04-08 NOTE — Progress Notes (Signed)
CADEN FUKUSHIMA - 71 y.o. male MRN [id number]  Date of birth: 10/27/1950  Office Visit Note: Visit Date: 03/16/2021 PCP: Alroy Dust, L.Marlou Sa, MD Referred by: Alroy Dust, L.Marlou Sa, MD  Subjective: Chief Complaint  Patient presents with   Lower Back - Pain   HPI: OREOLUWA AIGNER is a 71 y.o. male who comes in todayFor what was going to be planned as a repeat of an L2 transforaminal injection that we completed in October that helped him.  He is followed from an orthopedic standpoint by Dr. Jean Rosenthal.  He has had MRI of the lumbar spine showing spondylitic facet arthropathy and some narrowing but no severe stenosis.  He has had multiple medical problems and is on chronic opioid treatment as well for pain.  Today when I see him and we talked about repeating the injection he reports to me that his pain really is in the thoracic spine and not the lower lumbar spine.  All the notes from Dr. Ninfa Linden really discussed lumbar pain and the last injection we did perform October was at L2.  He does have increased kyphosis and he says most of his pain is in the thoracic area.  No real thoracic images to review.  He denies any pain around the ribs.  It is all axial thoracic pain.  Is not worse with breathing.  He gets more symptoms standing and moving then sitting.  He gets increased pain with lifting.  He rates his pain as a 7 out of 10.  He has had no other red flag complaints.  Extensive gastrointestinal medical history but no history of cancer.     Review of Systems  Musculoskeletal:  Positive for back pain.  All other systems reviewed and are negative. Otherwise per HPI.  Assessment & Plan: Visit Diagnoses:    ICD-10-CM   1. Spondylosis without myelopathy or radiculopathy, lumbar region  M47.816 DISCONTINUED: methylPREDNISolone acetate (DEPO-MEDROL) injection 80 mg    CANCELED: Facet Injection    2. Chronic bilateral thoracic back pain  M54.6 XR C-ARM NO REPORT   G89.29     3. Myofascial pain  syndrome  M79.18 XR C-ARM NO REPORT    4. Other secondary kyphosis, thoracic region  M40.14        Plan: Findings:  Chronic pain syndrome and pain in the thoracic spine which is his worst problem.  Most of the work-up has been for his lumbar spine unfortunately and he is done well with prior L2 injection that did seem to help his symptoms at that time but he really is having thoracic pain really about the lower angle of the scapula to about T12.  Exam shows focal trigger points and myofascial pain.  We discussed this at length with him today and try to explain the difference between that and the MRI that we have that just shows his lumbar spine.  At this point I did complete trigger point injections into the paraspinal musculature and multifidus.  We will see how he does with that and he will call us back to let us know.  If that is a big difference at least temporarily I would have him regroup with physical therapy for manual treatment trigger point treatment as well as focused exercises in this area.  Would consider thoracic MRI or follow-up with Dr. Ninfa Linden to reevaluate.   Meds & Orders:  Meds ordered this encounter  Medications   DISCONTD: methylPREDNISolone acetate (DEPO-MEDROL) injection 80 mg    Orders Placed This Encounter  Procedures   Trigger Point Inj   XR C-ARM NO REPORT    Follow-up: Return if symptoms worsen or fail to improve.   Procedures: Trigger Point Inj  Date/Time: 04/10/2021 5:51 AM Performed by: Magnus Sinning, MD Authorized by: Magnus Sinning, MD   Consent Given by:  Patient Site marked: the procedure site was marked   Timeout: prior to procedure the correct patient, procedure, and site was verified   Total # of Trigger Points:  3 or more Location: back   Needle Size:  25 G Approach:  Dorsal Medications #1:  20 mg triamcinolone acetonide 40 MG/ML Medications #2:  3 mL lidocaine 1 % Additional Injections?: No   Patient tolerance:  Patient tolerated the  procedure well with no immediate complications      Clinical History: MRI LUMBAR SPINE WITHOUT CONTRAST   TECHNIQUE: Multiplanar, multisequence MR imaging of the lumbar spine was performed. No intravenous contrast was administered.   COMPARISON:  Lumbar spine radiographs 03/11/2020   FINDINGS: Segmentation: 5 non rib-bearing lumbar type vertebral bodies are present. The lowest fully formed vertebral body is L5.   Alignment: Slight retrolisthesis is present at T12-L1, L1-2 and L2-3. Lumbar lordosis is preserved.   Vertebrae:  Marrow signal and vertebral body heights are normal.   Conus medullaris and cauda equina: Conus extends to the L1-2 level. Conus and cauda equina appear normal.   Paraspinal and other soft tissues: Layering gallstones noted. No inflammatory change to suggest cholecystitis. 11 mm simple cyst noted posteriorly in the right kidney. No other solid organ lesions are present. No significant adenopathy is present.   Disc levels:   T12-L1: Mild disc bulging without significant stenosis.   L1-2: Retrolisthesis and uncovering of the disc contributes to mild foraminal narrowing bilaterally.   L2-3: Retrolisthesis and uncovering of a broad-based disc protrusion contribute to mild foraminal narrowing bilaterally, left greater than right.   L3-4: Broad-based disc protrusion extends into the foramina. Moderate facet hypertrophy is noted bilaterally. Mild central and bilateral foraminal narrowing is present.   L4-5: A broad-based disc protrusion is present. Moderate facet hypertrophy is noted. Mild central and mild to moderate foraminal narrowing is worse right than left.   L5-S1: Facet and endplate spurring contribute to moderate left and mild right foraminal narrowing.   IMPRESSION: 1. Multilevel spondylosis of the lumbar spine as described. 2. Mild foraminal narrowing bilaterally at L1-2 and L2-3 is worse on the left. Slight retrolisthesis and uncovering  of a broad-based disc protrusion contribute at both levels. 3. Mild central and bilateral foraminal narrowing at L3-4 secondary to a broad-based disc protrusion and facet hypertrophy. 4. Mild central and mild to moderate foraminal narrowing bilaterally at L4-5 is worse right than left. 5. Moderate left and mild right foraminal stenosis at L5-S1 due to endplate and facet spurring. 6. Cholelithiasis without evidence for cholecystitis.     Electronically Signed   By: San Morelle M.D.   On: 12/28/2020 07:23   He reports that he has never smoked. He has quit using smokeless tobacco.  His smokeless tobacco use included snuff. No results for input(s): HGBA1C, LABURIC in the last 8760 hours.  Objective:  VS:  HT:     WT:    BMI:      BP:(!) 167/94   HR:97bpm   TEMP: ( )   RESP:  Physical Exam Vitals and nursing note reviewed.  Constitutional:      General: He is not in acute distress.    Appearance: Normal appearance.  He is well-developed.  HENT:     Head: Normocephalic and atraumatic.  Eyes:     Conjunctiva/sclera: Conjunctivae normal.     Pupils: Pupils are equal, round, and reactive to light.  Cardiovascular:     Rate and Rhythm: Normal rate.     Pulses: Normal pulses.     Heart sounds: Normal heart sounds.  Pulmonary:     Effort: Pulmonary effort is normal. No respiratory distress.  Musculoskeletal:     Cervical back: Normal range of motion and neck supple. No rigidity.     Right lower leg: No edema.     Left lower leg: No edema.     Comments: Patient is slow to stand from a seated position to full extension.  He does have pain in the spine with extension.  He has increased thoracic kyphosis with trigger points in the paraspinal musculature rhomboid etc.  He has no pain over the vertebral bodies.  He has no pain with hip rotation.  He has good distal strength.  Skin:    General: Skin is warm and dry.     Findings: No erythema or rash.  Neurological:     General: No  focal deficit present.     Mental Status: He is alert and oriented to person, place, and time.     Cranial Nerves: No cranial nerve deficit.     Sensory: No sensory deficit.     Motor: No weakness.     Coordination: Coordination normal.     Gait: Gait abnormal.  Psychiatric:        Mood and Affect: Mood normal.        Behavior: Behavior normal.    Ortho Exam  Imaging: No results found.  Past Medical/Family/Surgical/Social History: Medications & Allergies reviewed per EMR, new medications updated. 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   Bilateral inguinal hernia 09/28/2018   Incisional hernia 03/17/2018   Trigger thumb, left thumb 01/19/2018   Trigger finger, left index finger 07/18/2017   Trigger finger, right ring finger 01/31/2017   Trigger finger of left thumb 01/03/2017   Trigger finger of right thumb 01/03/2017   Trigger index finger of left hand 01/03/2017   Trigger index finger of right hand 01/03/2017   Malnutrition of moderate degree 07/19/2016   Intra-abdominal abscess ([place]) 07/16/2016   Cellulitis 07/15/2016   S/P colostomy takedown 06/28/2016   Pain in thoracic spine 06/09/2016   Mid back pain 06/09/2016   Postoperative fever 11/19/2015   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   Severe protein-calorie malnutrition ([place]) 11/17/2015   Sepsis ([place]) 11/14/2015   Hiccups 11/14/2015   AKI (acute kidney injury) ([place]) 11/14/2015   Fever    Leg swelling    Left shoulder pain    Muscle spasm of left shoulder    GI bleed 10/27/2015   Acute blood loss anemia 10/27/2015   Syncope 10/27/2015   Hyperglycemia 10/27/2015   Hypotension 10/27/2015   Neck pain 10/27/2015   Hematemesis 10/27/2015   Hematochezia 10/27/2015   Diverticulitis of colon with perforation 07/05/2015   Past Medical History:  Diagnosis Date   Anemia    Anxiety    BPH (benign prostatic hyperplasia)     Gastritis    GERD (gastroesophageal reflux disease)    "seldom" (07/16/2016)   GI bleed due to NSAIDs 10/27/2015   History of blood transfusion 06/2016   post OR/notes 07/15/2016  History of hiatal hernia    History of kidney stones    surgery to remove stone   Hyperlipidemia    Hypertension    no meds    Melanoma of back ([place])    "mid back"   Sigmoid diverticulitis    with perforation   Family History  Problem Relation Age of Onset   Stroke Mother    Stroke Brother    Heart disease Brother    Past Surgical History:  Procedure Laterality Date   COLON SURGERY     sigmoid   COLOSTOMY TAKEDOWN N/A 06/28/2016   Procedure: COLOSTOMY TAKEDOWN;  Surgeon: [name], MD;  Location: [facility];  Service: General;  Laterality: N/A;   CYSTOSCOPY WITH RETROGRADE PYELOGRAM, URETEROSCOPY AND STENT PLACEMENT Bilateral 02/12/2020   Procedure: CYSTOSCOPY WITH BILATERAL RETROGRADE PYELOGRAM,  AND LITHOPEXY;  Surgeon: Remi Haggard, MD;  Location: [facility];  Service: Urology;  Laterality: Bilateral;  1 HR   ESOPHAGOGASTRODUODENOSCOPY N/A 10/27/2015   Procedure: ESOPHAGOGASTRODUODENOSCOPY (EGD);  Surgeon: Clarene Essex, MD;  [facility];  Service: Endoscopy;  Laterality: N/A;   ESOPHAGOGASTRODUODENOSCOPY N/A 10/29/2015   Procedure: ESOPHAGOGASTRODUODENOSCOPY (EGD);  Surgeon: Ronald Lobo, MD;  Location[facility] ENDOSCOPY;  Service: Endoscopy;  Laterality: N/A;   ESOPHAGOGASTRODUODENOSCOPY N/A 10/30/2015   Procedure: ESOPHAGOGASTRODUODENOSCOPY (EGD);  Surgeon: Clarene Essex, MD;  Location: [facility] ENDOSCOPY;  Service: Endoscopy;  Laterality: N/A;   g tube discontinued  04/2020   per patient   [place]  11/21/2015   REDUCTION OF HIATAL HERNIA , REPAIR HIATAL HERNIA, RESECTION SMALL BOWEL WITH ANASTOMOSIS, PLACEMENT GASTROSTOMY TUBE, PLACEMENT DUODENOSTOMY TUBE (N/A)   HEMORRHOID BANDING  X 2   HERNIA REPAIR     HIATAL HERNIA REPAIR N/A 11/21/2015   Procedure: REDUCTION OF HIATAL  HERNIA , REPAIR HIATAL HERNIA, RESECTION SMALL BOWEL WITH ANASTOMOSIS, PLACEMENT GASTROSTOMY TUBE, PLACEMENT DUODENOSTOMY TUBE;  Surgeon: Mickeal Skinner, MD;  Location: [place];  Service: General;  Laterality: N/A;   HOLMIUM LASER APPLICATION Right 26/33/3545   Procedure: HOLMIUM LASER APPLICATION;  Surgeon: Remi Haggard, MD;  Location: [facility];  Service: Urology;  Laterality: Right;   INCISIONAL HERNIA REPAIR  06/28/2016   open/notes 07/15/2016   INCISIONAL HERNIA REPAIR  03/17/2018   WITH MESH   INCISIONAL HERNIA REPAIR N/A 03/17/2018   Procedure: INCISIONAL HERNIA REPAIR WITH MESH;  Surgeon: [name], MD;  Location: [place];  Service: General;  Laterality: N/A;   [place] N/A 10/16/2020   Procedure: Fatima Blank HERNIA REPAIR WITH MESH;  Surgeon: [name], MD;  Location: [place];  Service: General;  Laterality: N/A;   INGUINAL HERNIA REPAIR Bilateral 09/28/2018   INGUINAL HERNIA REPAIR Bilateral 09/28/2018   Procedure: BILATERAL OPEN INGUINAL HERNIA REPAIR WITH MESH;  Surgeon: [name], MD;  Location: [place];  Service: General;  Laterality: Bilateral;  GENERAL AND TAP BLOCK   INSERTION OF MESH N/A 03/17/2018   Procedure: INSERTION OF MESH;  Surgeon: [name], MD;  Location: [place];  Service: General;  Laterality: N/A;   INSERTION OF MESH Bilateral 09/28/2018   Procedure: Insertion Of Mesh;  Surgeon: [name], MD;  Location: [place];  Service: General;  Laterality: Bilateral;   IR CM INJ ANY COLONIC TUBE W/FLUORO  02/04/2017   IR GUIDED DRAIN W CATHETER PLACEMENT  07/06/2016   /NOTES 07/15/2016   IR PATIENT EVAL TECH 0-60 MINS  06/28/2019   IR RADIOLOGIST EVAL & MGMT  07/27/2016   IR RADIOLOGIST EVAL & MGMT  08/17/2016   IR RADIOLOGIST EVAL & MGMT  08/26/2016   IR REPLACE G-TUBE SIMPLE WO FLUORO  07/28/2017   IR REPLACE G-TUBE SIMPLE WO FLUORO  01/31/2018   IR REPLACE G-TUBE SIMPLE WO FLUORO  10/16/2018   IR REPLACE G-TUBE SIMPLE WO FLUORO   03/05/2019   IR REPLACE G-TUBE SIMPLE WO FLUORO  08/22/2019   IR REPLACE G-TUBE SIMPLE WO FLUORO  01/08/2020   IR REPLC GASTRO/COLONIC TUBE PERCUT W/FLUORO  08/04/2016   IR REPLC GASTRO/COLONIC TUBE PERCUT W/FLUORO  01/14/2017   IR US GUIDE BX ASP/DRAIN  08/04/2016   KNEE CARTILAGE SURGERY Right 1971   "opened me up"   LAPAROTOMY N/A 07/05/2015   Procedure: PARTIAL SIGMOID COLECTOMY AND COLOSTOMY;  Surgeon: [name], MD;  Location: [facility];  Service: General;  Laterality: N/A;   MELANOMA EXCISION  2001   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  10/30/2015   Procedure: REMOVAL OF DISTAL STOMACH;  Surgeon: Judeth Horn, MD;  Location: [place];  Service: General;;   REPAIR OF PERFORATED ULCER N/A 10/30/2015   Procedure: REPAIR OF BLEEDING  ULCER;  Surgeon: Judeth Horn, MD;  Location: [place];  Service: General;  Laterality: N/A;   TUMOR EXCISION  2009   "back; fatty tumor"   Social History   Occupational History   Occupation: unable to work since April  Tobacco Use   Smoking status: Never   Smokeless tobacco: Former    Types: Snuff   Tobacco comments:    occasional snuff  Vaping Use   Vaping Use: Never used  Substance and Sexual Activity   Alcohol use: No   Drug use: No   Sexual activity: Not Currently

## 2021-04-10 ENCOUNTER — Encounter: Payer: Self-pay | Admitting: Physical Medicine and Rehabilitation

## 2021-04-10 DIAGNOSIS — M7918 Myalgia, other site: Secondary | ICD-10-CM

## 2021-04-10 DIAGNOSIS — M546 Pain in thoracic spine: Secondary | ICD-10-CM | POA: Diagnosis not present

## 2021-04-10 DIAGNOSIS — G8929 Other chronic pain: Secondary | ICD-10-CM | POA: Diagnosis not present

## 2021-04-10 MED ORDER — LIDOCAINE HCL 1 % IJ SOLN
3.0000 mL | INTRAMUSCULAR | Status: AC | PRN
  Administered 2021-04-10: 3 mL

## 2021-04-10 MED ORDER — TRIAMCINOLONE ACETONIDE 40 MG/ML IJ SUSP
20.0000 mg | INTRAMUSCULAR | Status: AC | PRN
  Administered 2021-04-10: 20 mg via INTRAMUSCULAR

## 2021-04-14 ENCOUNTER — Ambulatory Visit (INDEPENDENT_AMBULATORY_CARE_PROVIDER_SITE_OTHER): Payer: Medicare Other | Admitting: Physical Medicine and Rehabilitation

## 2021-04-14 ENCOUNTER — Other Ambulatory Visit: Payer: Self-pay

## 2021-04-14 ENCOUNTER — Encounter: Payer: Self-pay | Admitting: Physical Medicine and Rehabilitation

## 2021-04-14 VITALS — BP 137/87 | HR 82

## 2021-04-14 DIAGNOSIS — M7918 Myalgia, other site: Secondary | ICD-10-CM

## 2021-04-14 DIAGNOSIS — M546 Pain in thoracic spine: Secondary | ICD-10-CM | POA: Diagnosis not present

## 2021-04-14 DIAGNOSIS — G894 Chronic pain syndrome: Secondary | ICD-10-CM

## 2021-04-14 DIAGNOSIS — M4014 Other secondary kyphosis, thoracic region: Secondary | ICD-10-CM

## 2021-04-14 MED ORDER — DIAZEPAM 5 MG PO TABS: ORAL_TABLET | ORAL | 0 refills | Status: DC

## 2021-04-14 NOTE — Progress Notes (Signed)
Pt state middle back pain that travels to his lower back mostly his left side. Pt state walking, standing bending makes the pain worse. Pt state he take [place] meds to help ease his pain.  Numeric Pain Rating Scale and Functional Assessment Average Pain 10 Pain Right Now 8 My pain is constant, sharp, dull, tingling, and aching Pain is worse with: walking, bending, sitting, standing, and some activites Pain improves with: medication   In the last MONTH (on 0-10 scale) has pain interfered with the following?  1. General activity like being  able to carry out your everyday physical activities such as walking, climbing stairs, carrying groceries, or moving a chair?  Rating(7)  2. Relation with others like being able to carry out your usual social activities and roles such as  activities at home, at work and in your community. Rating(8)  3. Enjoyment of life such that you have  been bothered by emotional problems such as feeling anxious, depressed or irritable?  Rating(9)

## 2021-04-14 NOTE — Progress Notes (Signed)
Miguel [name] - 71 y.o. male MRN [id number]  Date of birth: [birth year]  Office Visit Note: Visit Date: 04/14/2021 PCP: Alroy Dust, L.Marlou Sa, MD Referred by: Alroy Dust, L.Marlou Sa, MD  Subjective: Chief Complaint  Patient presents with   Middle Back - Pain   Lower Back - Pain   HPI: Miguel [name] is a 71 y.o. male who comes in today for evaluation of chronic, worsening and severe left sided thoracic back pain radiating to lower back region. Patient reports pain has been ongoing for several months. Patient states pain is exacerbated by movement and activity, describes as a soreness sensation and currently rates as 7 out of 10. Patient reports some relief of pain with home exercise regimen, rest and use of medications. His chronic pain is currently being managed with Oxycodone/Tramadol prescribed by his primary care provider Dr. Kelton Pillar. Patient has attended formal physical therapy at [facility] and reports some relief of pain with these treatments. Patients thoracic spine x-ray images from 2017 exhibits mild degenerative changes. Patient has not had MRI imaging of thoracic spine. Patient had thoracic trigger point injections performed by Dr. Magnus Sinning on 03/16/2021 and reports significant and sustained pain relief until recently. In the past, we have treated patient for chronic lower back issues and did have left L2 transforaminal epidural steroid injection in October of 2022 with good pain relief. Patient currently denies any lower back pain at this time. Patient denies focal weakness, numbness and tingling. Patient denies recent trauma or falls.   Review of Systems  Musculoskeletal:  Positive for back pain and myalgias.  Neurological:  Negative for tingling, sensory change, focal weakness and weakness.  All other systems reviewed and are negative. Otherwise per HPI.  Assessment & Plan: Visit Diagnoses:    ICD-10-CM   1. Pain in thoracic spine  M54.6  MR THORACIC SPINE WO CONTRAST    2. 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 Myofascial pain syndrome  M79.18 MR THORACIC SPINE WO CONTRAST    4. Chronic pain syndrome  G89.4        Plan: Findings:  Chronic, worsening and severe left-sided thoracic back pain radiating to lower back region.  Patient continues to have excruciating pain despite good conservative therapy such as formal physical therapy, home exercise regimen, rest and use of medications.  Patient's clinical presentation and exam are consistent with myofascial pain.  Patient did get good pain relief with recent paraspinal trigger point injections on the right but only temporary on the left.  We did perform left paraspinal trigger point injections today in the office without difficulty.  We also placed an order today to obtain thoracic MRI imaging.  Patient does voice concerns about anxiety related to MRI procedure and is requesting an open MRI machine. We did speak with patient about oral sedation and placed an order for pre-procedure Valium. We will have patient follow-up with [place] when thoracic MRI is completed for review and to re-evaluate pain/discuss treatment options. Patient encouraged to remain active and to continue home exercise regimen as tolerated. No red flag symptoms noted upon exam today.    Meds & Orders:  Meds ordered this encounter  Medications   diazepam (VALIUM) 5 MG tablet    Sig: Take one tablet by mouth with food one hour prior to procedure. May repeat 30 minutes prior if needed.    Dispense:  2 tablet    Refill:  0    Order  Specific Question:   Supervising Provider    Answer:   Magnus Sinning [[id number]]    Orders Placed This Encounter  Procedures   Trigger Point Inj   MR THORACIC SPINE WO CONTRAST    Follow-up: Return for follow up after thoracic MRI imaging is complete .   Procedures: Trigger Point Inj  Date/Time: 04/14/2021 9:30 AM Performed by: Magnus Sinning, MD Authorized by:  Magnus Sinning, MD   Consent Given by:  Patient Site marked: the procedure site was marked   Timeout: prior to procedure the correct patient, procedure, and site was verified   Total # of Trigger Points:  3 or more Location: neck and back   Needle Size:  25 G Approach:  Dorsal Medications #1:  20 mg triamcinolone acetonide 40 MG/ML Medications #2:  3 mL lidocaine 1 % Additional Injections?: No   Patient tolerance:  Patient tolerated the procedure well with no immediate complications Comments: Trigger points palpated and injected with needling technique in the left paraspinal and parascapular region essentially along prior scar line.      Clinical History: MRI LUMBAR SPINE WITHOUT CONTRAST   TECHNIQUE: Multiplanar, multisequence MR imaging of the lumbar spine was performed. No intravenous contrast was administered.   COMPARISON:  Lumbar spine radiographs 03/11/2020   FINDINGS: Segmentation: 5 non rib-bearing lumbar type vertebral bodies are present. The lowest fully formed vertebral body is L5.   Alignment: Slight retrolisthesis is present at T12-L1, L1-2 and L2-3. Lumbar lordosis is preserved.   Vertebrae:  Marrow signal and vertebral body heights are normal.   Conus medullaris and cauda equina: Conus extends to the L1-2 level. Conus and cauda equina appear normal.   Paraspinal and other soft tissues: Layering gallstones noted. No inflammatory change to suggest cholecystitis. 11 mm simple cyst noted posteriorly in the right kidney. No other solid organ lesions are present. No significant adenopathy is present.   Disc levels:   T12-L1: Mild disc bulging without significant stenosis.   L1-2: Retrolisthesis and uncovering of the disc contributes to mild foraminal narrowing bilaterally.   L2-3: Retrolisthesis and uncovering of a broad-based disc protrusion contribute to mild foraminal narrowing bilaterally, left greater than right.   L3-4: Broad-based disc  protrusion extends into the foramina. Moderate facet hypertrophy is noted bilaterally. Mild central and bilateral foraminal narrowing is present.   L4-5: A broad-based disc protrusion is present. Moderate facet hypertrophy is noted. Mild central and mild to moderate foraminal narrowing is worse right than left.   L5-S1: Facet and endplate spurring contribute to moderate left and mild right foraminal narrowing.   IMPRESSION: 1. Multilevel spondylosis of the lumbar spine as described. 2. Mild foraminal narrowing bilaterally at L1-2 and L2-3 is worse on the left. Slight retrolisthesis and uncovering of a broad-based disc protrusion contribute at both levels. 3. Mild central and bilateral foraminal narrowing at L3-4 secondary to a broad-based disc protrusion and facet hypertrophy. 4. Mild central and mild to moderate foraminal narrowing bilaterally at L4-5 is worse right than left. 5. Moderate left and mild right foraminal stenosis at L5-S1 due to endplate and facet spurring. 6. Cholelithiasis without evidence for cholecystitis.     Electronically Signed   By: San Morelle M.D.   On: 12/28/2020 07:23   He reports that he has never smoked. He has quit using smokeless tobacco.  His smokeless tobacco use included snuff. No results for input(s): HGBA1C, LABURIC in the last 8760 hours.  Objective:  VS:  HT:     WT:  BMI:      BP:137/87   HR:82bpm   TEMP: ( )   RESP:  Physical Exam Vitals and nursing note reviewed.  HENT:     Head: Normocephalic and atraumatic.     Right Ear: External ear normal.     Left Ear: External ear normal.     Nose: Nose normal.     Mouth/Throat:     Mouth: Mucous membranes are moist.  Eyes:     Extraocular Movements: Extraocular movements intact.  Cardiovascular:     Rate and Rhythm: Normal rate.     Pulses: Normal pulses.  Pulmonary:     Effort: Pulmonary effort is normal.  Abdominal:     General: Abdomen is flat. There is no distension.   Musculoskeletal:        General: Tenderness present.     Cervical back: Normal range of motion.     Comments: Pt is slow to rise from seated position to standing. Strong distal strength without clonus Sensation intact bilaterally. Multiple trigger points noted upon palpation of left paraspinal region. Walks independently, gait steady.   Skin:    General: Skin is warm and dry.     Capillary Refill: Capillary refill takes less than 2 seconds.  Neurological:     General: No focal deficit present.     Mental Status: He is alert and oriented to person, place, and time.  Psychiatric:        Mood and Affect: Mood normal.        Behavior: Behavior normal.    Ortho Exam  Imaging: No results found.  Past Medical/Family/Surgical/Social History: Medications & Allergies reviewed per EMR, new medications updated. 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   Bilateral inguinal hernia 09/28/2018   Incisional hernia 03/17/2018   Trigger thumb, left thumb 01/19/2018   Trigger finger, left index finger 07/18/2017   Trigger finger, right ring finger 01/31/2017   Trigger finger of left thumb 01/03/2017   Trigger finger of right thumb 01/03/2017   Trigger index finger of left hand 01/03/2017   Trigger index finger of right hand 01/03/2017   Malnutrition of moderate degree 07/19/2016   Intra-abdominal abscess ([place]) 07/16/2016   Cellulitis 07/15/2016   S/P colostomy takedown 06/28/2016   Pain in thoracic spine 06/09/2016   Mid back pain 06/09/2016   Postoperative fever 11/19/2015   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   Severe protein-calorie malnutrition ([place]) 11/17/2015   Sepsis ([place]) 11/14/2015   Hiccups 11/14/2015   AKI (acute kidney injury) ([place]) 11/14/2015   Fever    Leg swelling    Left shoulder pain    Muscle spasm of left shoulder    GI bleed 10/27/2015   Acute blood loss anemia 10/27/2015    Syncope 10/27/2015   Hyperglycemia 10/27/2015   Hypotension 10/27/2015   Neck pain 10/27/2015   Hematemesis 10/27/2015   Hematochezia 10/27/2015   Diverticulitis of colon with perforation 07/05/2015   Past Medical History:  Diagnosis Date   Anemia    Anxiety    BPH (benign prostatic hyperplasia)    Gastritis    GERD (gastroesophageal reflux disease)    "seldom" (07/16/2016)   GI bleed due to NSAIDs 10/27/2015   History of blood transfusion 06/2016   post OR/notes 07/15/2016   History of hiatal hernia    History of kidney stones    surgery to remove stone  Hyperlipidemia    Hypertension    no meds    Melanoma of back ([place])    "mid back"   Sigmoid diverticulitis    with perforation   Family History  Problem Relation Age of Onset   Stroke Mother    Stroke Brother    Heart disease Brother    Past Surgical History:  Procedure Laterality Date   COLON SURGERY     sigmoid   COLOSTOMY TAKEDOWN N/A 06/28/2016   Procedure: COLOSTOMY TAKEDOWN;  Surgeon: [name], MD;  Location: [place];  Service: General;  Laterality: N/A;   CYSTOSCOPY WITH RETROGRADE PYELOGRAM, URETEROSCOPY AND STENT PLACEMENT Bilateral 02/12/2020   Procedure: CYSTOSCOPY WITH BILATERAL RETROGRADE PYELOGRAM,  [place];  Surgeon: Remi Haggard, MD;  Location: [facility];  Service: Urology;  Laterality: Bilateral;  1 HR   ESOPHAGOGASTRODUODENOSCOPY N/A 10/27/2015   Procedure: ESOPHAGOGASTRODUODENOSCOPY (EGD);  Surgeon: Clarene Essex, MD;  Location[facility] ENDOSCOPY;  Service: Endoscopy;  Laterality: N/A;   ESOPHAGOGASTRODUODENOSCOPY N/A 10/29/2015   Procedure: ESOPHAGOGASTRODUODENOSCOPY (EGD);  Surgeon: Ronald Lobo, MD;  Location: [facility] ENDOSCOPY;  Service: Endoscopy;  Laterality: N/A;   ESOPHAGOGASTRODUODENOSCOPY N/A 10/30/2015   Procedure: ESOPHAGOGASTRODUODENOSCOPY (EGD);  Surgeon: Clarene Essex, MD;  Location: [facility] ENDOSCOPY;  Service: Endoscopy;  Laterality: N/A;   g tube discontinued  04/2020   per patient    [place]  11/21/2015   REDUCTION OF HIATAL HERNIA , REPAIR HIATAL HERNIA, RESECTION SMALL BOWEL WITH ANASTOMOSIS, PLACEMENT GASTROSTOMY TUBE, PLACEMENT DUODENOSTOMY TUBE (N/A)   HEMORRHOID BANDING  X 2   HERNIA REPAIR     HIATAL HERNIA REPAIR N/A 11/21/2015   Procedure: REDUCTION OF HIATAL HERNIA , REPAIR HIATAL HERNIA, RESECTION SMALL BOWEL WITH ANASTOMOSIS, PLACEMENT GASTROSTOMY TUBE, PLACEMENT DUODENOSTOMY TUBE;  Surgeon: Mickeal Skinner, MD;  Location: [place];  Service: General;  Laterality: N/A;   HOLMIUM LASER APPLICATION Right 32/20/2542   Procedure: HOLMIUM LASER APPLICATION;  Surgeon: Remi Haggard, MD;  Location: [facility];  Service: Urology;  Laterality: Right;   INCISIONAL HERNIA REPAIR  06/28/2016   open/notes 07/15/2016   INCISIONAL HERNIA REPAIR  03/17/2018   WITH MESH   INCISIONAL HERNIA REPAIR N/A 03/17/2018   Procedure: INCISIONAL HERNIA REPAIR WITH MESH;  Surgeon: [name], MD;  Location: [place];  Service: General;  Laterality: N/A;   [place] N/A 10/16/2020   Procedure: Fatima Blank HERNIA REPAIR WITH MESH;  Surgeon: [name], MD;  Location: [place];  Service: General;  Laterality: N/A;   INGUINAL HERNIA REPAIR Bilateral 09/28/2018   INGUINAL HERNIA REPAIR Bilateral 09/28/2018   Procedure: BILATERAL OPEN INGUINAL HERNIA REPAIR WITH MESH;  Surgeon: [name], MD;  Location: [place];  Service: General;  Laterality: Bilateral;  GENERAL AND TAP BLOCK   INSERTION OF MESH N/A 03/17/2018   Procedure: INSERTION OF MESH;  Surgeon: [name], MD;  Location: [place];  Service: General;  Laterality: N/A;   INSERTION OF MESH Bilateral 09/28/2018   Procedure: Insertion Of Mesh;  Surgeon: [name], MD;  Location: [place];  Service: General;  Laterality: Bilateral;   IR CM INJ ANY COLONIC TUBE W/FLUORO  02/04/2017   IR GUIDED DRAIN W CATHETER PLACEMENT  07/06/2016   /NOTES 07/15/2016   IR PATIENT EVAL TECH 0-60 MINS  06/28/2019    IR RADIOLOGIST EVAL & MGMT  07/27/2016   IR RADIOLOGIST EVAL & MGMT  08/17/2016   IR RADIOLOGIST EVAL & MGMT  08/26/2016   IR REPLACE G-TUBE SIMPLE WO FLUORO  07/28/2017   IR REPLACE G-TUBE SIMPLE WO FLUORO  01/31/2018   IR REPLACE G-TUBE SIMPLE WO FLUORO  10/16/2018   IR REPLACE G-TUBE SIMPLE WO FLUORO  03/05/2019   IR REPLACE G-TUBE SIMPLE WO FLUORO  08/22/2019   IR REPLACE G-TUBE SIMPLE WO FLUORO  01/08/2020   IR REPLC GASTRO/COLONIC TUBE PERCUT W/FLUORO  08/04/2016   IR REPLC GASTRO/COLONIC TUBE PERCUT W/FLUORO  01/14/2017   IR US GUIDE BX ASP/DRAIN  08/04/2016   KNEE CARTILAGE SURGERY Right 1971   "opened me up"   LAPAROTOMY N/A 07/05/2015   Procedure: PARTIAL SIGMOID COLECTOMY AND COLOSTOMY;  Surgeon: [name], MD;  Location: [place] [facility];  Service: General;  Laterality: N/A;   MELANOMA EXCISION  2001   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  10/30/2015   Procedure: REMOVAL OF DISTAL STOMACH;  Surgeon: Judeth Horn, MD;  Location: [place];  Service: General;;   REPAIR OF PERFORATED ULCER N/A 10/30/2015   Procedure: REPAIR OF BLEEDING  ULCER;  Surgeon: Judeth Horn, MD;  Location: [place];  Service: General;  Laterality: N/A;   TUMOR EXCISION  2009   "back; fatty tumor"   Social History   Occupational History   Occupation: unable to work since April  Tobacco Use   Smoking status: Never   Smokeless tobacco: Former    Types: Snuff   Tobacco comments:    occasional snuff  Vaping Use   Vaping Use: Never used  Substance and Sexual Activity   Alcohol use: No   Drug use: No   Sexual activity: Not Currently

## 2021-04-15 MED ORDER — LIDOCAINE HCL 1 % IJ SOLN
3.0000 mL | INTRAMUSCULAR | Status: AC | PRN
  Administered 2021-04-14: 3 mL

## 2021-04-15 MED ORDER — TRIAMCINOLONE ACETONIDE 40 MG/ML IJ SUSP
20.0000 mg | INTRAMUSCULAR | Status: AC | PRN
  Administered 2021-04-14: 20 mg via INTRAMUSCULAR

## 2021-04-27 ENCOUNTER — Encounter (HOSPITAL_BASED_OUTPATIENT_CLINIC_OR_DEPARTMENT_OTHER): Payer: Self-pay | Admitting: Physical Therapy

## 2021-05-06 ENCOUNTER — Ambulatory Visit
Admission: RE | Admit: 2021-05-06 | Discharge: 2021-05-06 | Disposition: A | Discharge status: Home / Self Care | Payer: Medicare Other | Source: Ambulatory Visit | Attending: Physical Medicine and Rehabilitation | Admitting: Physical Medicine and Rehabilitation

## 2021-05-06 ENCOUNTER — Telehealth: Payer: Self-pay | Admitting: Physical Medicine and Rehabilitation

## 2021-05-06 DIAGNOSIS — M546 Pain in thoracic spine: Secondary | ICD-10-CM | POA: Diagnosis not present

## 2021-05-06 NOTE — Telephone Encounter (Signed)
Patient called advised he is having an MRI today and wondered if he can also have the MRI on his upper right arm today as well? The number to contact patient is [phone] or [phone]

## 2021-05-07 ENCOUNTER — Telehealth: Payer: Self-pay | Admitting: Physical Medicine and Rehabilitation

## 2021-05-07 NOTE — Telephone Encounter (Signed)
Spoke with patient on the phone this afternoon to discuss results of thoracic MRI. Patient informed that thoracic MRI does not exhibit any concerning findings, from a spine standpoint I did not see anything that would explain his pain pattern. Patient reports good and sustained relief of pain with thoracic trigger point injections performed in January. Patient instructed to let us know if his pain worsens, we would consider repeating trigger point injections as needed. Patient is to follow-up with [place] as needed. I did discuss these findings with Dr. Ernestina Patches and he is agreeable with plan.

## 2021-05-25 ENCOUNTER — Ambulatory Visit: Payer: Medicare Other | Admitting: Physician Assistant

## 2021-05-27 ENCOUNTER — Encounter: Payer: Self-pay | Admitting: Orthopaedic Surgery

## 2021-05-27 ENCOUNTER — Ambulatory Visit (INDEPENDENT_AMBULATORY_CARE_PROVIDER_SITE_OTHER): Payer: Medicare Other | Admitting: Orthopaedic Surgery

## 2021-05-27 VITALS — Ht 63.0 in | Wt 120.0 lb

## 2021-05-27 DIAGNOSIS — G8929 Other chronic pain: Secondary | ICD-10-CM

## 2021-05-27 DIAGNOSIS — M25512 Pain in left shoulder: Secondary | ICD-10-CM

## 2021-05-27 DIAGNOSIS — M25511 Pain in right shoulder: Secondary | ICD-10-CM | POA: Diagnosis not present

## 2021-05-27 DIAGNOSIS — Z20822 Contact with and (suspected) exposure to covid-19: Secondary | ICD-10-CM | POA: Diagnosis not present

## 2021-05-27 MED ORDER — LIDOCAINE HCL 1 % IJ SOLN
3.0000 mL | INTRAMUSCULAR | Status: AC | PRN
  Administered 2021-05-27: 3 mL

## 2021-05-27 MED ORDER — METHYLPREDNISOLONE ACETATE 40 MG/ML IJ SUSP
40.0000 mg | INTRAMUSCULAR | Status: AC | PRN
  Administered 2021-05-27: 40 mg via INTRA_ARTICULAR

## 2021-05-27 NOTE — Progress Notes (Signed)
Office Visit Note   Patient: Miguel [name]           Date of Birth: 10-08-1950           MRN: [id number] Visit Date: 05/27/2021              Requested by: Alroy Dust, L.Marlou Sa, [place] [address] [employer] [address] [place],  [place] [address] PCP: Alroy Dust, L.Marlou Sa, MD   Assessment & Plan: Visit Diagnoses:  1. Chronic left shoulder pain   2. Chronic right shoulder pain     Plan: Per the patient's request I did provide a steroid injection in both shoulder subacromial outlets which he tolerated well.  My next step would be to him consider outpatient physical therapy for his shoulders.  He can always have repeat injections in 3 months if needed.  If he does come back in with shoulder pain we will get 3 views of both shoulders.  Follow-Up Instructions: No follow-ups on file.   Orders:  Orders Placed This Encounter  Procedures   Large Joint Inj   Large Joint Inj   No orders of the defined types were placed in this encounter.     Procedures: Large Joint Inj: R subacromial bursa on 05/27/2021 1:36 PM Indications: pain and diagnostic evaluation Details: 22 G 1.5 in needle  Arthrogram: No  Medications: 3 mL lidocaine 1 %; 40 mg methylPREDNISolone acetate 40 MG/ML Outcome: tolerated well, no immediate complications Procedure, treatment alternatives, risks and benefits explained, specific risks discussed. Consent was given by the patient. Immediately prior to procedure a time out was called to verify the correct patient, procedure, equipment, support staff and site/side marked as required. Patient was prepped and draped in the usual sterile fashion.    Large Joint Inj: L subacromial bursa on 05/27/2021 1:36 PM Indications: pain and diagnostic evaluation Details: 22 G 1.5 in needle  Arthrogram: No  Medications: 3 mL lidocaine 1 %; 40 mg methylPREDNISolone acetate 40 MG/ML Outcome: tolerated well, no immediate complications Procedure, treatment alternatives, risks and benefits explained,  specific risks discussed. Consent was given by the patient. Immediately prior to procedure a time out was called to verify the correct patient, procedure, equipment, support staff and site/side marked as required. Patient was prepped and draped in the usual sterile fashion.      Clinical Data: No additional findings.   Subjective: Chief Complaint  Patient presents with   Right Shoulder - Pain   Left Shoulder - Pain  The patient comes in today complaining of bilateral shoulder pain worse in the right arm than the left.  He is 71 years old and quite cachectic.  He says it really radiates down both shoulders and arms a little bit.  Onset happened about a few months ago after his getting lace up with a leaf blower.  It hurts to reach overhead and behind him.  He says it is painful to raise both arms.  He is not a diabetic.  He is never had surgery on his shoulders.  He is requesting steroid injections today.  HPI  Review of Systems There is currently listed no headache, chest pain, shortness of breath, fever, chills, nausea, vomiting  Objective: Vital Signs: Ht 5\' 3"  (1.6 m)    Wt 120 lb (54.4 kg)    BMI 21.26 kg/m   Physical Exam He is alert and orient x3 and in no acute distress Ortho Exam Examination of both shoulders does not show any significant weakness in the rotator cuff but  she can feel some grinding at the glenohumeral joint on both shoulders and pain in the subacromial outlet of both shoulders with positive Neer and Hawkins signs.  He does have pretty good range of motion of both shoulders.  I did not x-ray him today. Specialty Comments:  No specialty comments available.  Imaging: No results found.   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   Bilateral inguinal hernia 09/28/2018   Incisional hernia 03/17/2018   Trigger thumb, left thumb 01/19/2018   Trigger  finger, left index finger 07/18/2017   Trigger finger, right ring finger 01/31/2017   Trigger finger of left thumb 01/03/2017   Trigger finger of right thumb 01/03/2017   Trigger index finger of left hand 01/03/2017   Trigger index finger of right hand 01/03/2017   Malnutrition of moderate degree 07/19/2016   Intra-abdominal abscess ([place]) 07/16/2016   Cellulitis 07/15/2016   S/P colostomy takedown 06/28/2016   Pain in thoracic spine 06/09/2016   Mid back pain 06/09/2016   Postoperative fever 11/19/2015   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   Severe protein-calorie malnutrition ([place]) 11/17/2015   Sepsis ([place]) 11/14/2015   Hiccups 11/14/2015   AKI (acute kidney injury) ([place]) 11/14/2015   Fever    Leg swelling    Left shoulder pain    Muscle spasm of left shoulder    GI bleed 10/27/2015   Acute blood loss anemia 10/27/2015   Syncope 10/27/2015   Hyperglycemia 10/27/2015   Hypotension 10/27/2015   Neck pain 10/27/2015   Hematemesis 10/27/2015   Hematochezia 10/27/2015   Diverticulitis of colon with perforation 07/05/2015   Past Medical History:  Diagnosis Date   Anemia    Anxiety    BPH (benign prostatic hyperplasia)    Gastritis    GERD (gastroesophageal reflux disease)    "seldom" (07/16/2016)   GI bleed due to NSAIDs 10/27/2015   History of blood transfusion 06/2016   post OR/notes 07/15/2016   History of hiatal hernia    History of kidney stones    surgery to remove stone   Hyperlipidemia    Hypertension    no meds    Melanoma of back ([place])    "mid back"   Sigmoid diverticulitis    with perforation    Family History  Problem Relation Age of Onset   Stroke Mother    Stroke Brother    Heart disease Brother     Past Surgical History:  Procedure Laterality Date   COLON SURGERY     sigmoid   COLOSTOMY TAKEDOWN N/A 06/28/2016   Procedure: COLOSTOMY TAKEDOWN;  Surgeon: [name], MD;  Location: [facility];  Service: General;  Laterality: N/A;   CYSTOSCOPY WITH  RETROGRADE PYELOGRAM, URETEROSCOPY AND STENT PLACEMENT Bilateral 02/12/2020   Procedure: CYSTOSCOPY WITH BILATERAL RETROGRADE PYELOGRAM,  [place];  Surgeon: Remi Haggard, MD;  [facility];  Service: Urology;  Laterality: Bilateral;  1 HR   ESOPHAGOGASTRODUODENOSCOPY N/A 10/27/2015   Procedure: ESOPHAGOGASTRODUODENOSCOPY (EGD);  Surgeon: Clarene Essex, MD;  Location: [facility] ENDOSCOPY;  Service: Endoscopy;  Laterality: N/A;   ESOPHAGOGASTRODUODENOSCOPY N/A 10/29/2015   Procedure: ESOPHAGOGASTRODUODENOSCOPY (EGD);  Surgeon: Ronald Lobo, MD;  Location: [facility];  Service: Endoscopy;  Laterality: N/A;   ESOPHAGOGASTRODUODENOSCOPY N/A 10/30/2015   Procedure: ESOPHAGOGASTRODUODENOSCOPY (EGD);  Surgeon: Clarene Essex, MD;  [facility] END[facility]COPY;  Service: Endoscopy;  Laterality: N/A;   g tube discontinued  04/2020   per patient   GASTROSTOMY TUBE PLACEMENT  11/21/2015   REDUCTION OF HIATAL HERNIA , REPAIR HIATAL HERNIA, RESECTION SMALL BOWEL WITH ANASTOMOSIS, PLACEMENT GASTROSTOMY TUBE, PLACEMENT DUODENOSTOMY TUBE (N/A)   HEMORRHOID BANDING  X 2   HERNIA REPAIR     HIATAL HERNIA REPAIR N/A 11/21/2015   Procedure: REDUCTION OF HIATAL HERNIA , REPAIR HIATAL HERNIA, RESECTION SMALL BOWEL WITH ANASTOMOSIS, PLACEMENT GASTROSTOMY TUBE, PLACEMENT DUODENOSTOMY TUBE;  Surgeon: Mickeal Skinner, MD;  Location: [facility];  Service: General;  Laterality: N/A;   HOLMIUM LASER APPLICATION Right 19/41/7408   Procedure: HOLMIUM LASER APPLICATION;  Surgeon: Remi Haggard, MD;  [facility];  Service: Urology;  Laterality: Right;   INCISIONAL HERNIA REPAIR  06/28/2016   open/notes 07/15/2016   INCISIONAL HERNIA REPAIR  03/17/2018   WITH MESH   INCISIONAL HERNIA REPAIR N/A 03/17/2018   Procedure: INCISIONAL HERNIA REPAIR WITH MESH;  Surgeon: [name], MD;  Location: [facility];  Service: General;  Laterality: N/A;   [place] N/A 10/16/2020   Procedure: Fatima Blank HERNIA REPAIR WITH  MESH;  Surgeon: [name], MD;  Location: [facility];  Service: General;  Laterality: N/A;   INGUINAL HERNIA REPAIR Bilateral 09/28/2018   INGUINAL HERNIA REPAIR Bilateral 09/28/2018   Procedure: BILATERAL OPEN INGUINAL HERNIA REPAIR WITH MESH;  Surgeon: [name], MD;  Location: [facility];  Service: General;  Laterality: Bilateral;  GENERAL AND TAP BLOCK   INSERTION OF MESH N/A 03/17/2018   Procedure: INSERTION OF MESH;  Surgeon: [name], MD;  Location: [facility];  Service: General;  Laterality: N/A;   INSERTION OF MESH Bilateral 09/28/2018   Procedure: Insertion Of Mesh;  Surgeon: [name], MD;  Location: [facility];  Service: General;  Laterality: Bilateral;   IR CM INJ ANY COLONIC TUBE W/FLUORO  02/04/2017   IR GUIDED DRAIN W CATHETER PLACEMENT  07/06/2016   /NOTES 07/15/2016   IR PATIENT EVAL TECH 0-60 MINS  06/28/2019   IR RADIOLOGIST EVAL & MGMT  07/27/2016   IR RADIOLOGIST EVAL & MGMT  08/17/2016   IR RADIOLOGIST EVAL & MGMT  08/26/2016   IR REPLACE G-TUBE SIMPLE WO FLUORO  07/28/2017   IR REPLACE G-TUBE SIMPLE WO FLUORO  01/31/2018   IR REPLACE G-TUBE SIMPLE WO FLUORO  10/16/2018   IR REPLACE G-TUBE SIMPLE WO FLUORO  03/05/2019   IR REPLACE G-TUBE SIMPLE WO FLUORO  08/22/2019   IR REPLACE G-TUBE SIMPLE WO FLUORO  01/08/2020   IR [place] GASTRO/COLONIC TUBE PERCUT W/FLUORO  08/04/2016   IR [facility] GASTRO/COLONIC TUBE PERCUT W/FLUORO  01/14/2017   IR US GUIDE BX ASP/DRAIN  08/04/2016   KNEE CARTILAGE SURGERY Right 1971   "opened me up"   LAPAROTOMY N/A 07/05/2015   Procedure: PARTIAL SIGMOID COLECTOMY AND COLOSTOMY;  Surgeon: [name], MD;  Location: [facility];  Service: General;  Laterality: N/A;   MELANOMA EXCISION  2001   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  10/30/2015   Procedure: REMOVAL OF DISTAL STOMACH;  Surgeon: Judeth Horn, MD;  Location: [facility];  Service: General;;   REPAIR OF PERFORATED ULCER N/A 10/30/2015   Procedure: REPAIR OF BLEEDING  ULCER;  Surgeon:  Judeth Horn, MD;  Location: [facility];  Service: General;  Laterality: N/A;   TUMOR EXCISION  2009   "back; fatty tumor"   Social History   Occupational History   Occupation: unable to work since April  Tobacco Use   Smoking status: Never  Smokeless tobacco: Former    Types: Snuff   Tobacco comments:    occasional snuff  Vaping Use   Vaping Use: Never used  Substance and Sexual Activity   Alcohol use: No   Drug use: No   Sexual activity: Not Currently

## 2021-05-28 ENCOUNTER — Telehealth: Payer: Self-pay | Admitting: Physical Medicine and Rehabilitation

## 2021-05-28 NOTE — Telephone Encounter (Signed)
Patient called needing to schedule an appointment with Dr. Ernestina Patches for an injection in his back.  Pt# [phone] ?

## 2021-05-29 ENCOUNTER — Telehealth: Payer: Self-pay | Admitting: Physical Medicine and Rehabilitation

## 2021-05-29 NOTE — Telephone Encounter (Signed)
Pt. Returned call to [place]. Please call pt at [phone] ?

## 2021-06-24 ENCOUNTER — Ambulatory Visit: Payer: Self-pay

## 2021-06-24 ENCOUNTER — Ambulatory Visit (INDEPENDENT_AMBULATORY_CARE_PROVIDER_SITE_OTHER): Payer: Medicare Other | Admitting: Physical Medicine and Rehabilitation

## 2021-06-24 ENCOUNTER — Encounter: Payer: Self-pay | Admitting: Physical Medicine and Rehabilitation

## 2021-06-24 VITALS — BP 178/94 | HR 74

## 2021-06-24 DIAGNOSIS — M5416 Radiculopathy, lumbar region: Secondary | ICD-10-CM

## 2021-06-24 MED ORDER — DEXAMETHASONE SODIUM PHOSPHATE 10 MG/ML IJ SOLN
15.0000 mg | Freq: Once | INTRAMUSCULAR | Status: AC
  Administered 2021-06-24: 15 mg

## 2021-06-24 NOTE — Progress Notes (Signed)
Pt state middle back pain that travels to his lower back mostly his left side. Pt state walking, standing bending makes the pain worse. Pt state he takes pain meds to help ease his pain ? ?Numeric Pain Rating Scale and Functional Assessment ?Average Pain 7 ? ? ?In the last MONTH (on 0-10 scale) has pain interfered with the following? ? ?1. General activity like being  able to carry out your everyday physical activities such as walking, climbing stairs, carrying groceries, or moving a chair?  ?Rating(9) ? ? ?+Driver, -BT, -Dye Allergies. ? ?

## 2021-06-25 DIAGNOSIS — Z20822 Contact with and (suspected) exposure to covid-19: Secondary | ICD-10-CM | POA: Diagnosis not present

## 2021-06-28 NOTE — Progress Notes (Signed)
? ?Miguel [name] - 71 y.o. male MRN [id number]  Date of birth: 12/29/1950 ? ?Office Visit Note: ?Visit Date: 06/24/2021 ?PCP: Alroy Dust, L.Marlou Sa, MD ?Referred by: Alroy Dust, L.Marlou Sa, MD ? ?Subjective: ?Chief Complaint  ?Patient presents with  ? Middle Back - Pain  ? Lower Back - Pain  ? ?HPI:  Miguel [name] is a 71 y.o. male who comes in today for planned repeat Left L2-3  Lumbar Transforaminal epidural steroid injection with fluoroscopic guidance.  The patient has failed conservative care including home exercise, medications, time and activity modification.  This injection will be diagnostic and hopefully therapeutic.  Please see requesting physician notes for further details and justification. Patient received more than 50% pain relief from prior injection.  ? ?Referring: Dr. Jean Rosenthal ? ?ROS Otherwise per HPI. ? ?Assessment & Plan: ?Visit Diagnoses:  ?  ICD-10-CM   ?1. Lumbar radiculopathy  M54.16 XR C-ARM NO REPORT  ?  Epidural Steroid injection  ?  dexamethasone (DECADRON) injection 15 mg  ?  ?  ?Plan: No additional findings.  ? ?Meds & Orders:  ?Meds ordered this encounter  ?Medications  ? dexamethasone (DECADRON) injection 15 mg  ?  ?Orders Placed This Encounter  ?Procedures  ? XR C-ARM NO REPORT  ? Epidural Steroid injection  ?  ?Follow-up: Return if symptoms worsen or fail to improve.  ? ?Procedures: ?No procedures performed  ?Lumbosacral Transforaminal Epidural Steroid Injection - Sub-Pedicular Approach with Fluoroscopic Guidance ? ?Patient: Miguel [name]      ?Date of Birth: 1950/07/02 ?MRN: [id number] ?PCP: Alroy Dust, L.Marlou Sa, MD      ?Visit Date: 06/24/2021 ?  ?Universal Protocol:    ?Date/Time: 06/24/2021 ? ?Consent Given By: the patient ? ?Position: PRONE ? ?Additional Comments: ?Vital signs were monitored before and after the procedure. ?Patient was prepped and draped in the usual sterile fashion. ?The correct patient, procedure, and site was verified. ? ? ?Injection Procedure Details:   ? ?Procedure diagnoses: Lumbar radiculopathy [M54.16]   ? ?Meds Administered:  ?Meds ordered this encounter  ?Medications  ? dexamethasone (DECADRON) injection 15 mg  ? ? ?Laterality: Left ? ?Location/Site: L2 ? ?Needle:5.0 in., 22 ga.  Short bevel or Quincke spinal needle ? ?Needle Placement: Transforaminal ? ?Findings: ?  ? -Comments: Excellent flow of contrast along the nerve, nerve root and into the epidural space. ? ?Procedure Details: ?After squaring off the end-plates to get a true AP view, the C-arm was positioned so that an oblique view of the foramen as noted above was visualized. The target area is just inferior to the "nose of the scotty dog" or sub pedicular. The soft tissues overlying this structure were infiltrated with 2-3 ml. of 1% Lidocaine without Epinephrine. ? ?The spinal needle was inserted toward the target using a "trajectory" view along the fluoroscope beam.  Under AP and lateral visualization, the needle was advanced so it did not puncture dura and was located close the 6 O'Clock position of the pedical in AP tracterory. Biplanar projections were used to confirm position. Aspiration was confirmed to be negative for CSF and/or blood. A 1-2 ml. volume of Isovue-250 was injected and flow of contrast was noted at each level. Radiographs were obtained for documentation purposes.  ? ?After attaining the desired flow of contrast documented above, a 0.5 to 1.0 ml test dose of 0.25% Marcaine was injected into each respective transforaminal space.  The patient was observed for 90 seconds post injection.  After no sensory deficits were reported, and normal lower  extremity motor function was noted,   the above injectate was administered so that equal amounts of the injectate were placed at each foramen (level) into the transforaminal epidural space. ? ? ?Additional Comments:  ?The patient tolerated the procedure well ?Dressing: 2 x 2 sterile gauze and Band-Aid ?  ? ?Post-procedure details: ?Patient  was observed during the procedure. ?Post-procedure instructions were reviewed. ? ?Patient left the clinic in stable condition. ?  ? ?Clinical History: ?MRI LUMBAR SPINE WITHOUT CONTRAST  ?   ?TECHNIQUE:  ?Multiplanar, multisequence MR imaging of the lumbar spine was  ?performed. No intravenous contrast was administered.  ?   ?COMPARISON:  Lumbar spine radiographs 03/11/2020  ?   ?FINDINGS:  ?Segmentation: 5 non rib-bearing lumbar type vertebral bodies are  ?present. The lowest fully formed vertebral body is L5.  ?   ?Alignment: Slight retrolisthesis is present at T12-L1, L1-2 and  ?L2-3. Lumbar lordosis is preserved.  ?   ?Vertebrae:  Marrow signal and vertebral body heights are normal.  ?   ?Conus medullaris and cauda equina: Conus extends to the L1-2 level.  ?Conus and cauda equina appear normal.  ?   ?Paraspinal and other soft tissues: Layering gallstones noted. No  ?inflammatory change to suggest cholecystitis. 11 mm simple cyst  ?noted posteriorly in the right kidney. No other solid organ lesions  ?are present. No significant adenopathy is present.  ?   ?Disc levels:  ?   ?T12-L1: Mild disc bulging without significant stenosis.  ?   ?L1-2: Retrolisthesis and uncovering of the disc contributes to mild  ?foraminal narrowing bilaterally.  ?   ?L2-3: Retrolisthesis and uncovering of a broad-based disc protrusion  ?contribute to mild foraminal narrowing bilaterally, left greater  ?than right.  ?   ?L3-4: Broad-based disc protrusion extends into the foramina.  ?Moderate facet hypertrophy is noted bilaterally. Mild central and  ?bilateral foraminal narrowing is present.  ?   ?L4-5: A broad-based disc protrusion is present. Moderate facet  ?hypertrophy is noted. Mild central and mild to moderate foraminal  ?narrowing is worse right than left.  ?   ?L5-S1: Facet and endplate spurring contribute to moderate left and  ?mild right foraminal narrowing.  ?   ?IMPRESSION:  ?1. Multilevel spondylosis of the lumbar spine as  described.  ?2. Mild foraminal narrowing bilaterally at L1-2 and L2-3 is worse on  ?the left. Slight retrolisthesis and uncovering of a broad-based disc  ?protrusion contribute at both levels.  ?3. Mild central and bilateral foraminal narrowing at L3-4 secondary  ?to a broad-based disc protrusion and facet hypertrophy.  ?4. Mild central and mild to moderate foraminal narrowing bilaterally  ?at L4-5 is worse right than left.  ?5. Moderate left and mild right foraminal stenosis at L5-S1 due to  ?endplate and facet spurring.  ?6. Cholelithiasis without evidence for cholecystitis.  ?   ?   ?Electronically Signed  ?  By: San Morelle M.D.  ?  On: 12/28/2020 07:23  ? ? ? ?Objective:  VS:  HT:    WT:   BMI:     BP:(!) 178/94  HR:74bpm  TEMP: ( )  RESP:  ?Physical Exam ?Vitals and nursing note reviewed.  ?Constitutional:   ?   General: He is not in acute distress. ?   Appearance: Normal appearance. He is not ill-appearing.  ?HENT:  ?   Head: Normocephalic and atraumatic.  ?   Right Ear: External ear normal.  ?   Left Ear: External ear normal.  ?  Nose: No congestion.  ?Eyes:  ?   Extraocular Movements: Extraocular movements intact.  ?Cardiovascular:  ?   Rate and Rhythm: Normal rate.  ?   Pulses: Normal pulses.  ?Pulmonary:  ?   Effort: Pulmonary effort is normal. No respiratory distress.  ?Abdominal:  ?   General: There is no distension.  ?   Palpations: Abdomen is soft.  ?Musculoskeletal:     ?   General: No tenderness or signs of injury.  ?   Cervical back: Neck supple.  ?   Right lower leg: No edema.  ?   Left lower leg: No edema.  ?   Comments: Patient has good distal strength without clonus.  ?Skin: ?   Findings: No erythema or rash.  ?Neurological:  ?   General: No focal deficit present.  ?   Mental Status: He is alert and oriented to person, place, and time.  ?   Sensory: No sensory deficit.  ?   Motor: No weakness or abnormal muscle tone.  ?   Coordination: Coordination normal.  ?Psychiatric:     ?    Mood and Affect: Mood normal.     ?   Behavior: Behavior normal.  ?  ? ?Imaging: ?No results found. ?

## 2021-06-28 NOTE — Procedures (Signed)
Lumbosacral Transforaminal Epidural Steroid Injection - Sub-Pedicular Approach with Fluoroscopic Guidance ? ?Patient: Miguel [name]      ?Date of Birth: 1950/12/10 ?MRN: [id number] ?PCP: Alroy Dust, L.Marlou Sa, MD      ?Visit Date: 06/24/2021 ?  ?Universal Protocol:    ?Date/Time: 06/24/2021 ? ?Consent Given By: the patient ? ?Position: PRONE ? ?Additional Comments: ?Vital signs were monitored before and after the procedure. ?Patient was prepped and draped in the usual sterile fashion. ?The correct patient, procedure, and site was verified. ? ? ?Injection Procedure Details:  ? ?Procedure diagnoses: Lumbar radiculopathy [M54.16]   ? ?Meds Administered:  ?Meds ordered this encounter  ?Medications  ? dexamethasone (DECADRON) injection 15 mg  ? ? ?Laterality: Left ? ?Location/Site: L2 ? ?Needle:5.0 in., 22 ga.  Short bevel or Quincke spinal needle ? ?Needle Placement: Transforaminal ? ?Findings: ?  ? -Comments: Excellent flow of contrast along the nerve, nerve root and into the epidural space. ? ?Procedure Details: ?After squaring off the end-plates to get a true AP view, the C-arm was positioned so that an oblique view of the foramen as noted above was visualized. The target area is just inferior to the "nose of the scotty dog" or sub pedicular. The soft tissues overlying this structure were infiltrated with 2-3 ml. of 1% Lidocaine without Epinephrine. ? ?The spinal needle was inserted toward the target using a "trajectory" view along the fluoroscope beam.  Under AP and lateral visualization, the needle was advanced so it did not puncture dura and was located close the 6 O'Clock position of the pedical in AP tracterory. Biplanar projections were used to confirm position. Aspiration was confirmed to be negative for CSF and/or blood. A 1-2 ml. volume of Isovue-250 was injected and flow of contrast was noted at each level. Radiographs were obtained for documentation purposes.  ? ?After attaining the desired flow of contrast  documented above, a 0.5 to 1.0 ml test dose of 0.25% Marcaine was injected into each respective transforaminal space.  The patient was observed for 90 seconds post injection.  After no sensory deficits were reported, and normal lower extremity motor function was noted,   the above injectate was administered so that equal amounts of the injectate were placed at each foramen (level) into the transforaminal epidural space. ? ? ?Additional Comments:  ?The patient tolerated the procedure well ?Dressing: 2 x 2 sterile gauze and Band-Aid ?  ? ?Post-procedure details: ?Patient was observed during the procedure. ?Post-procedure instructions were reviewed. ? ?Patient left the clinic in stable condition. ? ?

## 2021-06-29 ENCOUNTER — Ambulatory Visit (INDEPENDENT_AMBULATORY_CARE_PROVIDER_SITE_OTHER): Payer: Medicare Other

## 2021-06-29 ENCOUNTER — Other Ambulatory Visit: Payer: Self-pay

## 2021-06-29 ENCOUNTER — Ambulatory Visit: Payer: Self-pay

## 2021-06-29 ENCOUNTER — Ambulatory Visit (INDEPENDENT_AMBULATORY_CARE_PROVIDER_SITE_OTHER): Payer: Medicare Other | Admitting: Orthopaedic Surgery

## 2021-06-29 ENCOUNTER — Encounter: Payer: Self-pay | Admitting: Orthopaedic Surgery

## 2021-06-29 DIAGNOSIS — M25512 Pain in left shoulder: Secondary | ICD-10-CM | POA: Diagnosis not present

## 2021-06-29 DIAGNOSIS — G8929 Other chronic pain: Secondary | ICD-10-CM | POA: Diagnosis not present

## 2021-06-29 DIAGNOSIS — M25511 Pain in right shoulder: Secondary | ICD-10-CM

## 2021-06-29 NOTE — Progress Notes (Signed)
The patient is a 71 year old cachectic individual who has chronic bilateral shoulder pain.  His right shoulder hurts much worse than the left shoulder.  A month ago I did place a steroid injection in both shoulder subacromial outlets.  He says that the injections did help but only for short period of time. ? ?Both shoulders do move well but have pain throughout the arc of motion.  Both shoulders show some weakness in the rotator cuff.  There is no grinding at the glenohumeral joint. ? ?X-rays of both shoulder show no acute findings.  The left humeral head is just slightly high riding.  Both shoulders have slight calcifications at the insertion of the rotator cuff and AC joint arthritic changes.  Both shoulders are well located. ? ?At this point I would like to send him to outpatient physical therapy at [facility] to work on improving his shoulder function and decreasing his shoulder pain.  He agrees with this recommendation.  We will work on getting this set up.  I will then see him back at 6 weeks to see how he is doing overall.  Any modalities per the therapist discretion will be warranted. ?

## 2021-07-02 ENCOUNTER — Ambulatory Visit (HOSPITAL_BASED_OUTPATIENT_CLINIC_OR_DEPARTMENT_OTHER): Payer: Medicare Other | Attending: Orthopaedic Surgery | Admitting: Physical Therapy

## 2021-07-02 ENCOUNTER — Encounter (HOSPITAL_BASED_OUTPATIENT_CLINIC_OR_DEPARTMENT_OTHER): Payer: Self-pay | Admitting: Physical Therapy

## 2021-07-02 DIAGNOSIS — G8929 Other chronic pain: Secondary | ICD-10-CM | POA: Insufficient documentation

## 2021-07-02 DIAGNOSIS — M546 Pain in thoracic spine: Secondary | ICD-10-CM

## 2021-07-02 DIAGNOSIS — R252 Cramp and spasm: Secondary | ICD-10-CM

## 2021-07-02 DIAGNOSIS — M25512 Pain in left shoulder: Secondary | ICD-10-CM | POA: Insufficient documentation

## 2021-07-02 DIAGNOSIS — M25511 Pain in right shoulder: Secondary | ICD-10-CM | POA: Diagnosis not present

## 2021-07-02 DIAGNOSIS — R293 Abnormal posture: Secondary | ICD-10-CM | POA: Diagnosis not present

## 2021-07-02 NOTE — Therapy (Signed)
?OUTPATIENT PHYSICAL THERAPY SHOULDER EVALUATION ? ? ?Patient Name: Miguel [name] ?MRN: [id number] ?DOB:Apr 12, 1950, 71 y.o., male ?Today's Date: 07/03/2021 ? ? PT End of Session - 07/02/21 1329   ? ? Visit Number 1   ? Number of Visits 6   ? Date for PT Re-Evaluation 08/13/21   ? PT Start Time 1315   ? PT Stop Time 1358   ? PT Time Calculation (min) 43 min   ? Activity Tolerance Patient tolerated treatment well   ? Behavior During Therapy [facility] for tasks assessed/performed   ? ?  ?  ? ?  ? ? ?Past Medical History:  ?Diagnosis Date  ? Anemia   ? Anxiety   ? BPH (benign prostatic hyperplasia)   ? Gastritis   ? GERD (gastroesophageal reflux disease)   ? "seldom" (07/16/2016)  ? GI bleed due to NSAIDs 10/27/2015  ? History of blood transfusion 06/2016  ? post OR/notes 07/15/2016  ? History of hiatal hernia   ? History of kidney stones   ? surgery to remove stone  ? Hyperlipidemia   ? Hypertension   ? no meds   ? Melanoma of back ([place])   ? "mid back"  ? Sigmoid diverticulitis   ? with perforation  ? ?Past Surgical History:  ?Procedure Laterality Date  ? COLON SURGERY    ? sigmoid  ? COLOSTOMY TAKEDOWN N/A 06/28/2016  ? Procedure: COLOSTOMY TAKEDOWN;  Surgeon: [name], MD;  Location: [place];  Service: General;  Laterality: N/A;  ? CYSTOSCOPY WITH RETROGRADE PYELOGRAM, URETEROSCOPY AND STENT PLACEMENT Bilateral 02/12/2020  ? Procedure: CYSTOSCOPY WITH BILATERAL RETROGRADE PYELOGRAM,  AND LITHOPEXY;  Surgeon: Remi Haggard, MD;  [facility];  Service: Urology;  Laterality: Bilateral;  1 HR  ? ESOPHAGOGASTRODUODENOSCOPY N/A 10/27/2015  ? Procedure: ESOPHAGOGASTRODUODENOSCOPY (EGD);  Surgeon: Clarene Essex, MD;  Location: [facility] ENDOSCOPY;  Service: Endoscopy;  Laterality: N/A;  ? ESOPHAGOGASTRODUODENOSCOPY N/A 10/29/2015  ? Procedure: ESOPHAGOGASTRODUODENOSCOPY (EGD);  Surgeon: Ronald Lobo, MD;  Location: [place] ENDOSCOPY;  Service: Endoscopy;  Laterality: N/A;  ? ESOPHAGOGASTRODUODENOSCOPY N/A 10/30/2015  ? Procedure:  ESOPHAGOGASTRODUODENOSCOPY (EGD);  Surgeon: Clarene Essex, MD;  Location: [facility] ENDOSCOPY;  Service: Endoscopy;  Laterality: N/A;  ? g tube discontinued  04/2020  ? per patient  ? GASTROSTOMY TUBE PLACEMENT  11/21/2015  ? REDUCTION OF HIATAL HERNIA , REPAIR HIATAL HERNIA, RESECTION SMALL BOWEL WITH ANASTOMOSIS, PLACEMENT GASTROSTOMY TUBE, PLACEMENT DUODENOSTOMY TUBE (N/A)  ? HEMORRHOID BANDING  X 2  ? HERNIA REPAIR    ? HIATAL HERNIA REPAIR N/A 11/21/2015  ? Procedure: REDUCTION OF HIATAL HERNIA , REPAIR HIATAL HERNIA, RESECTION SMALL BOWEL WITH ANASTOMOSIS, PLACEMENT GASTROSTOMY TUBE, PLACEMENT DUODENOSTOMY TUBE;  Surgeon: Mickeal Skinner, MD;  Location: [place];  Service: General;  Laterality: N/A;  ? HOLMIUM LASER APPLICATION Right 82/95/6213  ? Procedure: HOLMIUM LASER APPLICATION;  Surgeon: Remi Haggard, MD;  [facility];  Service: Urology;  Laterality: Right;  ? [place] REPAIR  06/28/2016  ? open/notes 07/15/2016  ? INCISIONAL HERNIA REPAIR  03/17/2018  ? WITH MESH  ? INCISIONAL HERNIA REPAIR N/A 03/17/2018  ? Procedure: INCISIONAL HERNIA REPAIR WITH MESH;  Surgeon: [name], MD;  Location: [place];  Service: General;  Laterality: N/A;  ? [place] N/A 10/16/2020  ? Procedure: INCISIONAL HERNIA REPAIR WITH MESH;  Surgeon: [name], MD;  Location: [place];  Service: General;  Laterality: N/A;  ? INGUINAL HERNIA REPAIR Bilateral 09/28/2018  ? INGUINAL HERNIA REPAIR Bilateral 09/28/2018  ?  Procedure: BILATERAL OPEN INGUINAL HERNIA REPAIR WITH MESH;  Surgeon: [name], MD;  Location: [place];  Service: General;  Laterality: Bilateral;  GENERAL AND TAP BLOCK  ? INSERTION OF MESH N/A 03/17/2018  ? Procedure: INSERTION OF MESH;  Surgeon: [name], MD;  Location: [place];  Service: General;  Laterality: N/A;  ? INSERTION OF MESH Bilateral 09/28/2018  ? Procedure: Insertion Of Mesh;  Surgeon: [name], MD;  Location: [place];  Service: General;  Laterality:  Bilateral;  ? IR CM INJ ANY COLONIC TUBE W/FLUORO  02/04/2017  ? IR GUIDED DRAIN W CATHETER PLACEMENT  07/06/2016  ? /NOTES 07/15/2016  ? IR PATIENT EVAL TECH 0-60 MINS  06/28/2019  ? IR RADIOLOGIST EVAL & MGMT  07/27/2016  ? IR RADIOLOGIST EVAL & MGMT  08/17/2016  ? IR RADIOLOGIST EVAL & MGMT  08/26/2016  ? IR REPLACE G-TUBE SIMPLE WO FLUORO  07/28/2017  ? IR REPLACE G-TUBE SIMPLE WO FLUORO  01/31/2018  ? IR REPLACE G-TUBE SIMPLE WO FLUORO  10/16/2018  ? IR REPLACE G-TUBE SIMPLE WO FLUORO  03/05/2019  ? IR REPLACE G-TUBE SIMPLE WO FLUORO  08/22/2019  ? IR REPLACE G-TUBE SIMPLE WO FLUORO  01/08/2020  ? IR REPLC GASTRO/COLONIC TUBE PERCUT W/FLUORO  08/04/2016  ? IR REPLC GASTRO/COLONIC TUBE PERCUT W/FLUORO  01/14/2017  ? IR US GUIDE BX ASP/DRAIN  08/04/2016  ? KNEE CARTILAGE SURGERY Right 1971  ? "opened me up"  ? LAPAROTOMY N/A 07/05/2015  ? Procedure: PARTIAL SIGMOID COLECTOMY AND COLOSTOMY;  Surgeon: [name], MD;  Location: [place];  Service: General;  Laterality: N/A;  ? MELANOMA EXCISION  2001  ? 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  10/30/2015  ? Procedure: REMOVAL OF DISTAL STOMACH;  Surgeon: Judeth Horn, MD;  Location: [place];  Service: General;;  ? REPAIR OF PERFORATED ULCER N/A 10/30/2015  ? Procedure: REPAIR OF BLEEDING  ULCER;  Surgeon: Judeth Horn, MD;  Location: [place];  Service: General;  Laterality: N/A;  ? TUMOR EXCISION  2009  ? "back; fatty tumor"  ? ?Patient Active Problem List  ? Diagnosis Date Noted  ? Effusion, right knee   ? 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 Bilateral inguinal hernia 09/28/2018  ? Incisional hernia 03/17/2018  ? Trigger thumb, left thumb 01/19/2018  ? Trigger finger, left index finger 07/18/2017  ? Trigger finger, right ring finger 01/31/2017  ? Trigger finger of left thumb 01/03/2017  ? Trigger finger of right thumb 01/03/2017  ? Trigger index finger of left hand 01/03/2017  ? Trigger index finger of right hand 01/03/2017  ? Malnutrition of moderate  degree 07/19/2016  ? Intra-abdominal abscess ([place]) 07/16/2016  ? Cellulitis 07/15/2016  ? S/P colostomy takedown 06/28/2016  ? Pain in thoracic spine 06/09/2016  ? Mid back pain 06/09/2016  ? Postoperative fever 11/19/2015  ? 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  ? Severe protein-calorie malnutrition ([place]) 11/17/2015  ? Sepsis ([place]) 11/14/2015  ? Hiccups 11/14/2015  ? AKI (acute kidney injury) ([place]) 11/14/2015  ? Fever   ? Leg swelling   ? Left shoulder pain   ? Muscle spasm of left shoulder   ? GI bleed 10/27/2015  ? Acute blood loss anemia 10/27/2015  ? Syncope 10/27/2015  ? Hyperglycemia 10/27/2015  ? Hypotension 10/27/2015  ? Neck pain 10/27/2015  ? Hematemesis 10/27/2015  ? Hematochezia 10/27/2015  ? Diverticulitis of colon with perforation 07/05/2015  ? ? ?PCP: Alroy Dust, L.Marlou Sa, MD ? ?REFERRING PROVIDER: Ninfa Linden,  Lind Guest* ? ?REFERRING DIAG:  ?M25.512,G89.29 (ICD-10-CM) - Chronic left shoulder pain  ?M25.511,G89.29 (ICD-10-CM) - Chronic right shoulder pain  ? ? ?THERAPY DIAG:  ?Bilateral shoulder pain  ?Abnormal posture  ? ?ONSET DATE: Around January had an injection that lasted a while but wore off.   ? ?SUBJECTIVE:                                                                                                                                                                                     ? ?SUBJECTIVE STATEMENT: ?Patient had an onset of bilateral shoulder pain.  ? ?PERTINENT HISTORY: ? ? ?PAIN:  ?Are you having pain? Yes: NPRS scale: 5/10 with movement  ?Pain location: right shoulder  ?Pain description: aching  ?Aggravating factors: reaching overhead and weed eating  ?Relieving factors: not using the shoulder  ? ?Yes: NPRS scale: 3/10 ?Pain location: left shoulder  ?Pain description: aching  ?Aggravating factors: use of the shoulder  ?Relieving factors: not using the shoulder  ? ?PRECAUTIONS: None ? ?WEIGHT BEARING RESTRICTIONS No ? ?FALLS:  ?Has patient fallen in last 6 months? No ? ?LIVING  ENVIRONMENT: ?Nothing pertinent  ? ?OCCUPATION: ?Retired  ? ?Hobbies:  ?Enjoys working in the yard  ? ?PLOF: Independent ? ?PATIENT GOALS  ?To have less pain  ?OBJECTIVE:  ? ?DIAGNOSTIC FINDINGS:  ?Right shoulder x-ra

## 2021-07-03 ENCOUNTER — Encounter (HOSPITAL_BASED_OUTPATIENT_CLINIC_OR_DEPARTMENT_OTHER): Payer: Self-pay | Admitting: Physical Therapy

## 2021-07-06 ENCOUNTER — Telehealth: Payer: Self-pay | Admitting: Orthopaedic Surgery

## 2021-07-06 ENCOUNTER — Other Ambulatory Visit: Payer: Self-pay | Admitting: Orthopaedic Surgery

## 2021-07-06 MED ORDER — HYDROCODONE-ACETAMINOPHEN 5-325 MG PO TABS
1.0000 | ORAL_TABLET | Freq: Two times a day (BID) | ORAL | 0 refills | Status: DC | PRN
Start: 2021-07-06 — End: 2023-11-15

## 2021-07-06 NOTE — Telephone Encounter (Signed)
Pt would like refill on hydrocodone.  ? ?CB [phone] ?

## 2021-07-06 NOTE — Telephone Encounter (Signed)
Please advise 

## 2021-07-07 DIAGNOSIS — Z20822 Contact with and (suspected) exposure to covid-19: Secondary | ICD-10-CM | POA: Diagnosis not present

## 2021-07-08 ENCOUNTER — Encounter (HOSPITAL_BASED_OUTPATIENT_CLINIC_OR_DEPARTMENT_OTHER): Payer: Self-pay | Admitting: Physical Therapy

## 2021-07-08 ENCOUNTER — Ambulatory Visit (HOSPITAL_BASED_OUTPATIENT_CLINIC_OR_DEPARTMENT_OTHER): Payer: Medicare Other | Admitting: Physical Therapy

## 2021-07-08 DIAGNOSIS — G8929 Other chronic pain: Secondary | ICD-10-CM

## 2021-07-08 DIAGNOSIS — M25512 Pain in left shoulder: Secondary | ICD-10-CM | POA: Diagnosis not present

## 2021-07-08 DIAGNOSIS — M25511 Pain in right shoulder: Secondary | ICD-10-CM | POA: Diagnosis not present

## 2021-07-08 DIAGNOSIS — R293 Abnormal posture: Secondary | ICD-10-CM | POA: Diagnosis not present

## 2021-07-08 NOTE — Therapy (Signed)
?OUTPATIENT PHYSICAL THERAPY SHOULDER EVALUATION ? ? ?Patient Name: Miguel [name] ?MRN: [id number] ?DOB:[birth year], 71 y.o., male ?Today's Date: 07/08/2021 ? ? PT End of Session - 07/08/21 1213   ? ? Visit Number 2   ? Number of Visits 6   ? Date for PT Re-Evaluation 08/13/21   ? PT Start Time 1145   ? PT Stop Time 1228   ? PT Time Calculation (min) 43 min   ? Activity Tolerance Patient tolerated treatment well   ? Behavior During Therapy [facility] for tasks assessed/performed   ? ?  ?  ? ?  ? ? ?Past Medical History:  ?Diagnosis Date  ? Anemia   ? Anxiety   ? BPH (benign prostatic hyperplasia)   ? Gastritis   ? GERD (gastroesophageal reflux disease)   ? "seldom" (07/16/2016)  ? GI bleed due to NSAIDs 10/27/2015  ? History of blood transfusion 06/2016  ? post OR/notes 07/15/2016  ? History of hiatal hernia   ? History of kidney stones   ? surgery to remove stone  ? Hyperlipidemia   ? Hypertension   ? no meds   ? Melanoma of back ([place])   ? "mid back"  ? Sigmoid diverticulitis   ? with perforation  ? ?Past Surgical History:  ?Procedure Laterality Date  ? COLON SURGERY    ? sigmoid  ? COLOSTOMY TAKEDOWN N/A 06/28/2016  ? Procedure: COLOSTOMY TAKEDOWN;  Surgeon: [name], MD;  Location: [place];  Service: General;  Laterality: N/A;  ? CYSTOSCOPY WITH RETROGRADE PYELOGRAM, URETEROSCOPY AND STENT PLACEMENT Bilateral 02/12/2020  ? Procedure: CYSTOSCOPY WITH BILATERAL RETROGRADE PYELOGRAM,  AND LITHOPEXY;  Surgeon: Remi Haggard, MD;  [facility];  Service: Urology;  Laterality: Bilateral;  1 HR  ? ESOPHAGOGASTRODUODENOSCOPY N/A 10/27/2015  ? Procedure: ESOPHAGOGASTRODUODENOSCOPY (EGD);  Surgeon: Clarene Essex, MD;  Location: [facility] ENDOSCOPY;  Service: Endoscopy;  Laterality: N/A;  ? ESOPHAGOGASTRODUODENOSCOPY N/A 10/29/2015  ? Procedure: ESOPHAGOGASTRODUODENOSCOPY (EGD);  Surgeon: Ronald Lobo, MD;  Location: [facility] ENDOSCOPY;  Service: Endoscopy;  Laterality: N/A;  ? ESOPHAGOGASTRODUODENOSCOPY N/A 10/30/2015  ? Procedure:  ESOPHAGOGASTRODUODENOSCOPY (EGD);  Surgeon: Clarene Essex, MD;  Location: [facility] ENDOSCOPY;  Service: Endoscopy;  Laterality: N/A;  ? g tube discontinued  04/2020  ? per patient  ? GASTROSTOMY TUBE PLACEMENT  11/21/2015  ? REDUCTION OF HIATAL HERNIA , REPAIR HIATAL HERNIA, RESECTION SMALL BOWEL WITH ANASTOMOSIS, PLACEMENT GASTROSTOMY TUBE, PLACEMENT DUODENOSTOMY TUBE (N/A)  ? HEMORRHOID BANDING  X 2  ? HERNIA REPAIR    ? HIATAL HERNIA REPAIR N/A 11/21/2015  ? Procedure: REDUCTION OF HIATAL HERNIA , REPAIR HIATAL HERNIA, RESECTION SMALL BOWEL WITH ANASTOMOSIS, PLACEMENT GASTROSTOMY TUBE, PLACEMENT DUODENOSTOMY TUBE;  Surgeon: Mickeal Skinner, MD;  Location: [place];  Service: General;  Laterality: N/A;  ? HOLMIUM LASER APPLICATION Right 95/28/4132  ? Procedure: HOLMIUM LASER APPLICATION;  Surgeon: Remi Haggard, MD;  [facility];  Service: Urology;  Laterality: Right;  ? [place] REPAIR  06/28/2016  ? open/notes 07/15/2016  ? INCISIONAL HERNIA REPAIR  03/17/2018  ? WITH MESH  ? INCISIONAL HERNIA REPAIR N/A 03/17/2018  ? Procedure: INCISIONAL HERNIA REPAIR WITH MESH;  Surgeon: [name], MD;  Location: [place];  Service: General;  Laterality: N/A;  ? [place] N/A 10/16/2020  ? Procedure: INCISIONAL HERNIA REPAIR WITH MESH;  Surgeon: [name], MD;  Location: [place];  Service: General;  Laterality: N/A;  ? INGUINAL HERNIA REPAIR Bilateral 09/28/2018  ? INGUINAL HERNIA REPAIR Bilateral 09/28/2018  ?  Procedure: BILATERAL OPEN INGUINAL HERNIA REPAIR WITH MESH;  Surgeon: [name], MD;  Location: [place];  Service: General;  Laterality: Bilateral;  GENERAL AND TAP BLOCK  ? INSERTION OF MESH N/A 03/17/2018  ? Procedure: INSERTION OF MESH;  Surgeon: [name], MD;  Location: [place];  Service: General;  Laterality: N/A;  ? INSERTION OF MESH Bilateral 09/28/2018  ? Procedure: Insertion Of Mesh;  Surgeon: [name], MD;  Location: [place];  Service: General;  Laterality:  Bilateral;  ? IR CM INJ ANY COLONIC TUBE W/FLUORO  02/04/2017  ? IR GUIDED DRAIN W CATHETER PLACEMENT  07/06/2016  ? /NOTES 07/15/2016  ? IR PATIENT EVAL TECH 0-60 MINS  06/28/2019  ? IR RADIOLOGIST EVAL & MGMT  07/27/2016  ? IR RADIOLOGIST EVAL & MGMT  08/17/2016  ? IR RADIOLOGIST EVAL & MGMT  08/26/2016  ? IR REPLACE G-TUBE SIMPLE WO FLUORO  07/28/2017  ? IR REPLACE G-TUBE SIMPLE WO FLUORO  01/31/2018  ? IR REPLACE G-TUBE SIMPLE WO FLUORO  10/16/2018  ? IR REPLACE G-TUBE SIMPLE WO FLUORO  03/05/2019  ? IR REPLACE G-TUBE SIMPLE WO FLUORO  08/22/2019  ? IR REPLACE G-TUBE SIMPLE WO FLUORO  01/08/2020  ? IR REPLC GASTRO/COLONIC TUBE PERCUT W/FLUORO  08/04/2016  ? IR REPLC GASTRO/COLONIC TUBE PERCUT W/FLUORO  01/14/2017  ? IR US GUIDE BX ASP/DRAIN  08/04/2016  ? KNEE CARTILAGE SURGERY Right 1971  ? "opened me up"  ? LAPAROTOMY N/A 07/05/2015  ? Procedure: PARTIAL SIGMOID COLECTOMY AND COLOSTOMY;  Surgeon: [name], MD;  Location: [place];  Service: General;  Laterality: N/A;  ? MELANOMA EXCISION  2001  ? 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  10/30/2015  ? Procedure: REMOVAL OF DISTAL STOMACH;  Surgeon: Judeth Horn, MD;  Location: [place];  Service: General;;  ? REPAIR OF PERFORATED ULCER N/A 10/30/2015  ? Procedure: REPAIR OF BLEEDING  ULCER;  Surgeon: Judeth Horn, MD;  Location: [place];  Service: General;  Laterality: N/A;  ? TUMOR EXCISION  2009  ? "back; fatty tumor"  ? ?Patient Active Problem List  ? Diagnosis Date Noted  ? Effusion, right knee   ? 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 Bilateral inguinal hernia 09/28/2018  ? Incisional hernia 03/17/2018  ? Trigger thumb, left thumb 01/19/2018  ? Trigger finger, left index finger 07/18/2017  ? Trigger finger, right ring finger 01/31/2017  ? Trigger finger of left thumb 01/03/2017  ? Trigger finger of right thumb 01/03/2017  ? Trigger index finger of left hand 01/03/2017  ? Trigger index finger of right hand 01/03/2017  ? Malnutrition of moderate  degree 07/19/2016  ? Intra-abdominal abscess ([place]) 07/16/2016  ? Cellulitis 07/15/2016  ? S/P colostomy takedown 06/28/2016  ? Pain in thoracic spine 06/09/2016  ? Mid back pain 06/09/2016  ? Postoperative fever 11/19/2015  ? 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  ? Severe protein-calorie malnutrition ([place]) 11/17/2015  ? Sepsis ([place]) 11/14/2015  ? Hiccups 11/14/2015  ? AKI (acute kidney injury) ([place]) 11/14/2015  ? Fever   ? Leg swelling   ? Left shoulder pain   ? Muscle spasm of left shoulder   ? GI bleed 10/27/2015  ? Acute blood loss anemia 10/27/2015  ? Syncope 10/27/2015  ? Hyperglycemia 10/27/2015  ? Hypotension 10/27/2015  ? Neck pain 10/27/2015  ? Hematemesis 10/27/2015  ? Hematochezia 10/27/2015  ? Diverticulitis of colon with perforation 07/05/2015  ? ? ?PCP: Alroy Dust, L.Marlou Sa, MD ? ?REFERRING PROVIDER: Alroy Dust,  L.Dean, MD ? ?REFERRING DIAG:  ?M25.512,G89.29 (ICD-10-CM) - Chronic left shoulder pain  ?M25.511,G89.29 (ICD-10-CM) - Chronic right shoulder pain  ? ? ?THERAPY DIAG:  ?Bilateral shoulder pain  ?Abnormal posture  ? ?ONSET DATE: Around January had an injection that lasted a while but wore off.   ? ?SUBJECTIVE:                                                                                                                                                                                     ? ?SUBJECTIVE STATEMENT: ?Patient had an onset of bilateral shoulder pain.  ? ?PERTINENT HISTORY: ? ? ?PAIN:  ?Are you having pain? Yes: NPRS scale: 5/10 with movement  ?Pain location: right shoulder  ?Pain description: aching  ?Aggravating factors: reaching overhead and weed eating  ?Relieving factors: not using the shoulder  ? ?Yes: NPRS scale: 3/10 ?Pain location: left shoulder  ?Pain description: aching  ?Aggravating factors: use of the shoulder  ?Relieving factors: not using the shoulder  ? ?PRECAUTIONS: None ? ?WEIGHT BEARING RESTRICTIONS No ? ?FALLS:  ?Has patient fallen in last 6 months? No ? ?LIVING  ENVIRONMENT: ?Nothing pertinent  ? ?OCCUPATION: ?Retired  ? ?Hobbies:  ?Enjoys working in the yard  ? ?PLOF: Independent ? ?PATIENT GOALS  ?To have less pain  ?OBJECTIVE:  ? ?DIAGNOSTIC FINDINGS:  ?Right shoulder x-ray

## 2021-07-15 ENCOUNTER — Ambulatory Visit (HOSPITAL_BASED_OUTPATIENT_CLINIC_OR_DEPARTMENT_OTHER): Payer: Medicare Other | Admitting: Physical Therapy

## 2021-07-15 DIAGNOSIS — G8929 Other chronic pain: Secondary | ICD-10-CM | POA: Diagnosis not present

## 2021-07-15 DIAGNOSIS — R293 Abnormal posture: Secondary | ICD-10-CM | POA: Diagnosis not present

## 2021-07-15 DIAGNOSIS — M25511 Pain in right shoulder: Secondary | ICD-10-CM | POA: Diagnosis not present

## 2021-07-15 DIAGNOSIS — M25512 Pain in left shoulder: Secondary | ICD-10-CM | POA: Diagnosis not present

## 2021-07-15 DIAGNOSIS — Z20822 Contact with and (suspected) exposure to covid-19: Secondary | ICD-10-CM | POA: Diagnosis not present

## 2021-07-15 NOTE — Therapy (Signed)
?OUTPATIENT PHYSICAL THERAPY SHOULDER EVALUATION ? ? ?Patient Name: Miguel [name] ?MRN: [id number] ?DOB:05-26-1950, 71 y.o., male ?Today's Date: 07/15/2021 ? ? PT End of Session - 07/15/21 1407   ? ? Visit Number 3   ? Number of Visits 6   ? Date for PT Re-Evaluation 08/13/21   ? PT Start Time 1405   ? PT Stop Time 4098   ? PT Time Calculation (min) 40 min   ? Activity Tolerance Patient tolerated treatment well   ? ?  ?  ? ?  ? ? ?Past Medical History:  ?Diagnosis Date  ? Anemia   ? Anxiety   ? BPH (benign prostatic hyperplasia)   ? Gastritis   ? GERD (gastroesophageal reflux disease)   ? "seldom" (07/16/2016)  ? GI bleed due to NSAIDs 10/27/2015  ? History of blood transfusion 06/2016  ? post OR/notes 07/15/2016  ? History of hiatal hernia   ? History of kidney stones   ? surgery to remove stone  ? Hyperlipidemia   ? Hypertension   ? no meds   ? Melanoma of back ([place])   ? "mid back"  ? Sigmoid diverticulitis   ? with perforation  ? ?Past Surgical History:  ?Procedure Laterality Date  ? COLON SURGERY    ? sigmoid  ? COLOSTOMY TAKEDOWN N/A 06/28/2016  ? Procedure: COLOSTOMY TAKEDOWN;  Surgeon: [name], MD;  Location: [place];  Service: General;  Laterality: N/A;  ? CYSTOSCOPY WITH RETROGRADE PYELOGRAM, URETEROSCOPY AND STENT PLACEMENT Bilateral 02/12/2020  ? Procedure: CYSTOSCOPY WITH BILATERAL RETROGRADE PYELOGRAM,  AND LITHOPEXY;  Surgeon: Remi Haggard, MD;  Location[facility];  Service: Urology;  Laterality: Bilateral;  1 HR  ? ESOPHAGOGASTRODUODENOSCOPY N/A 10/27/2015  ? Procedure: ESOPHAGOGASTRODUODENOSCOPY (EGD);  Surgeon: Clarene Essex, MD;  Location: [facility] ENDOSCOPY;  Service: Endoscopy;  Laterality: N/A;  ? ESOPHAGOGASTRODUODENOSCOPY N/A 10/29/2015  ? Procedure: ESOPHAGOGASTRODUODENOSCOPY (EGD);  Surgeon: Ronald Lobo, MD;  Location: [facility] ENDOSCOPY;  Service: Endoscopy;  Laterality: N/A;  ? ESOPHAGOGASTRODUODENOSCOPY N/A 10/30/2015  ? Procedure: ESOPHAGOGASTRODUODENOSCOPY (EGD);  Surgeon: Clarene Essex, MD;   Location: [facility] ENDOSCOPY;  Service: Endoscopy;  Laterality: N/A;  ? g tube discontinued  04/2020  ? per patient  ? GASTROSTOMY TUBE PLACEMENT  11/21/2015  ? REDUCTION OF HIATAL HERNIA , REPAIR HIATAL HERNIA, RESECTION SMALL BOWEL WITH ANASTOMOSIS, PLACEMENT GASTROSTOMY TUBE, PLACEMENT DUODENOSTOMY TUBE (N/A)  ? HEMORRHOID BANDING  X 2  ? HERNIA REPAIR    ? HIATAL HERNIA REPAIR N/A 11/21/2015  ? Procedure: REDUCTION OF HIATAL HERNIA , REPAIR HIATAL HERNIA, RESECTION SMALL BOWEL WITH ANASTOMOSIS, PLACEMENT GASTROSTOMY TUBE, PLACEMENT DUODENOSTOMY TUBE;  Surgeon: Mickeal Skinner, MD;  Location: [place];  Service: General;  Laterality: N/A;  ? HOLMIUM LASER APPLICATION Right 11/91/4782  ? Procedure: HOLMIUM LASER APPLICATION;  Surgeon: Remi Haggard, MD;  Location[facility];  Service: Urology;  Laterality: Right;  ? [place] REPAIR  06/28/2016  ? open/notes 07/15/2016  ? INCISIONAL HERNIA REPAIR  03/17/2018  ? WITH MESH  ? INCISIONAL HERNIA REPAIR N/A 03/17/2018  ? Procedure: INCISIONAL HERNIA REPAIR WITH MESH;  Surgeon: [name], MD;  Location: [place];  Service: General;  Laterality: N/A;  ? [place] N/A 10/16/2020  ? Procedure: INCISIONAL HERNIA REPAIR WITH MESH;  Surgeon: [name], MD;  Location: [place];  Service: General;  Laterality: N/A;  ? INGUINAL HERNIA REPAIR Bilateral 09/28/2018  ? INGUINAL HERNIA REPAIR Bilateral 09/28/2018  ? Procedure: BILATERAL OPEN INGUINAL HERNIA REPAIR WITH MESH;  Surgeon: [name], MD;  Location: [place];  Service: General;  Laterality: Bilateral;  GENERAL AND TAP BLOCK  ? INSERTION OF MESH N/A 03/17/2018  ? Procedure: INSERTION OF MESH;  Surgeon: [name], MD;  Location: [place];  Service: General;  Laterality: N/A;  ? INSERTION OF MESH Bilateral 09/28/2018  ? Procedure: Insertion Of Mesh;  Surgeon: [name], MD;  Location: [place];  Service: General;  Laterality: Bilateral;  ? IR CM INJ ANY COLONIC TUBE W/FLUORO  02/04/2017  ?  IR GUIDED DRAIN W CATHETER PLACEMENT  07/06/2016  ? /NOTES 07/15/2016  ? IR PATIENT EVAL TECH 0-60 MINS  06/28/2019  ? IR RADIOLOGIST EVAL & MGMT  07/27/2016  ? IR RADIOLOGIST EVAL & MGMT  08/17/2016  ? IR RADIOLOGIST EVAL & MGMT  08/26/2016  ? IR REPLACE G-TUBE SIMPLE WO FLUORO  07/28/2017  ? IR REPLACE G-TUBE SIMPLE WO FLUORO  01/31/2018  ? IR REPLACE G-TUBE SIMPLE WO FLUORO  10/16/2018  ? IR REPLACE G-TUBE SIMPLE WO FLUORO  03/05/2019  ? IR REPLACE G-TUBE SIMPLE WO FLUORO  08/22/2019  ? IR REPLACE G-TUBE SIMPLE WO FLUORO  01/08/2020  ? IR REPLC GASTRO/COLONIC TUBE PERCUT W/FLUORO  08/04/2016  ? IR REPLC GASTRO/COLONIC TUBE PERCUT W/FLUORO  01/14/2017  ? IR US GUIDE BX ASP/DRAIN  08/04/2016  ? KNEE CARTILAGE SURGERY Right 1971  ? "opened me up"  ? LAPAROTOMY N/A 07/05/2015  ? Procedure: PARTIAL SIGMOID COLECTOMY AND COLOSTOMY;  Surgeon: [name], MD;  Location: [place];  Service: General;  Laterality: N/A;  ? MELANOMA EXCISION  2001  ? 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  10/30/2015  ? Procedure: REMOVAL OF DISTAL STOMACH;  Surgeon: Judeth Horn, MD;  Location: [place];  Service: General;;  ? REPAIR OF PERFORATED ULCER N/A 10/30/2015  ? Procedure: REPAIR OF BLEEDING  ULCER;  Surgeon: Judeth Horn, MD;  Location: [place];  Service: General;  Laterality: N/A;  ? TUMOR EXCISION  2009  ? "back; fatty tumor"  ? ?Patient Active Problem List  ? Diagnosis Date Noted  ? Effusion, right knee   ? 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 Bilateral inguinal hernia 09/28/2018  ? Incisional hernia 03/17/2018  ? Trigger thumb, left thumb 01/19/2018  ? Trigger finger, left index finger 07/18/2017  ? Trigger finger, right ring finger 01/31/2017  ? Trigger finger of left thumb 01/03/2017  ? Trigger finger of right thumb 01/03/2017  ? Trigger index finger of left hand 01/03/2017  ? Trigger index finger of right hand 01/03/2017  ? Malnutrition of moderate degree 07/19/2016  ? Intra-abdominal abscess ([place]) 07/16/2016   ? Cellulitis 07/15/2016  ? S/P colostomy takedown 06/28/2016  ? Pain in thoracic spine 06/09/2016  ? Mid back pain 06/09/2016  ? Postoperative fever 11/19/2015  ? 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  ? Severe protein-calorie malnutrition ([place]) 11/17/2015  ? Sepsis ([place]) 11/14/2015  ? Hiccups 11/14/2015  ? AKI (acute kidney injury) ([place]) 11/14/2015  ? Fever   ? Leg swelling   ? Left shoulder pain   ? Muscle spasm of left shoulder   ? GI bleed 10/27/2015  ? Acute blood loss anemia 10/27/2015  ? Syncope 10/27/2015  ? Hyperglycemia 10/27/2015  ? Hypotension 10/27/2015  ? Neck pain 10/27/2015  ? Hematemesis 10/27/2015  ? Hematochezia 10/27/2015  ? Diverticulitis of colon with perforation 07/05/2015  ? ? ?PCP: Alroy Dust, L.Marlou Sa, MD ? ?REFERRING PROVIDER: Alroy Dust, L.Marlou Sa, MD ? ?REFERRING DIAG:  ?M25.512,G89.29 (ICD-10-CM) -  Chronic left shoulder pain  ?M25.511,G89.29 (ICD-10-CM) - Chronic right shoulder pain  ? ? ?THERAPY DIAG:  ?Bilateral shoulder pain  ?Abnormal posture  ? ?ONSET DATE: Around January had an injection that lasted a while but wore off.   ? ?SUBJECTIVE:                                                                                                                                                                                     ? ?SUBJECTIVE STATEMENT: ?Walked my dog and that flared up my left thoracic region.  Right shoulder worse than left.   ? ?PERTINENT HISTORY: ? ? ?PAIN:  ?Are you having pain? Yes: NPRS scale: 5/10 with movement  ?Pain location: right shoulder and left thoracic lower ?Pain description: aching  ?Aggravating factors: reaching overhead and weed eating  ?Relieving factors: not using the shoulder  ? ?Yes: NPRS scale: 3/10 ?Pain location: left shoulder  ?Pain description: aching  ?Aggravating factors: use of the shoulder  ?Relieving factors: not using the shoulder  ? ?PRECAUTIONS: None ? ?WEIGHT BEARING RESTRICTIONS No ? ?FALLS:  ?Has patient fallen in last 6 months? No ? ?LIVING  ENVIRONMENT: ?Nothing pertinent  ? ?OCCUPATION: ?Retired  ? ?Hobbies:  ?Enjoys working in the yard  ? ?PLOF: Independent ? ?PATIENT GOALS  ?To have less pain  ?OBJECTIVE:  ? ?DIAGNOSTIC FINDINGS:  ?Right shoulder x-

## 2021-07-20 ENCOUNTER — Ambulatory Visit (HOSPITAL_BASED_OUTPATIENT_CLINIC_OR_DEPARTMENT_OTHER): Payer: Medicare Other | Admitting: Physical Therapy

## 2021-07-20 DIAGNOSIS — G8929 Other chronic pain: Secondary | ICD-10-CM

## 2021-07-20 DIAGNOSIS — R293 Abnormal posture: Secondary | ICD-10-CM

## 2021-07-20 DIAGNOSIS — M25511 Pain in right shoulder: Secondary | ICD-10-CM | POA: Diagnosis not present

## 2021-07-20 DIAGNOSIS — M25512 Pain in left shoulder: Secondary | ICD-10-CM | POA: Diagnosis not present

## 2021-07-20 NOTE — Therapy (Signed)
?OUTPATIENT PHYSICAL THERAPY SHOULDER EVALUATION ? ? ?Patient Name: Miguel [name] ?MRN: [id number] ?DOB:Jun 17, 1950, 71 y.o., male ?Today's Date: 07/20/2021 ? ? PT End of Session - 07/20/21 1143   ? ? Visit Number 4   ? Number of Visits 6   ? Date for PT Re-Evaluation 08/13/21   ? PT Start Time 1145   ? PT Stop Time 1230   ? PT Time Calculation (min) 45 min   ? Activity Tolerance Patient tolerated treatment well   ? ?  ?  ? ?  ? ? ?Past Medical History:  ?Diagnosis Date  ? Anemia   ? Anxiety   ? BPH (benign prostatic hyperplasia)   ? Gastritis   ? GERD (gastroesophageal reflux disease)   ? "seldom" (07/16/2016)  ? GI bleed due to NSAIDs 10/27/2015  ? History of blood transfusion 06/2016  ? post OR/notes 07/15/2016  ? History of hiatal hernia   ? History of kidney stones   ? surgery to remove stone  ? Hyperlipidemia   ? Hypertension   ? no meds   ? Melanoma of back ([place])   ? "mid back"  ? Sigmoid diverticulitis   ? with perforation  ? ?Past Surgical History:  ?Procedure Laterality Date  ? COLON SURGERY    ? sigmoid  ? COLOSTOMY TAKEDOWN N/A 06/28/2016  ? Procedure: COLOSTOMY TAKEDOWN;  Surgeon: [name], MD;  Location: [place];  Service: General;  Laterality: N/A;  ? CYSTOSCOPY WITH RETROGRADE PYELOGRAM, URETEROSCOPY AND STENT PLACEMENT Bilateral 02/12/2020  ? Procedure: CYSTOSCOPY WITH BILATERAL RETROGRADE PYELOGRAM,  AND LITHOPEXY;  Surgeon: Remi Haggard, MD;  [facility];  Service: Urology;  Laterality: Bilateral;  1 HR  ? ESOPHAGOGASTRODUODENOSCOPY N/A 10/27/2015  ? Procedure: ESOPHAGOGASTRODUODENOSCOPY (EGD);  Surgeon: Clarene Essex, MD;  Location: [facility] ENDOSCOPY;  Service: Endoscopy;  Laterality: N/A;  ? ESOPHAGOGASTRODUODENOSCOPY N/A 10/29/2015  ? Procedure: ESOPHAGOGASTRODUODENOSCOPY (EGD);  Surgeon: Ronald Lobo, MD;  Location: [facility] ENDOSCOPY;  Service: Endoscopy;  Laterality: N/A;  ? ESOPHAGOGASTRODUODENOSCOPY N/A 10/30/2015  ? Procedure: ESOPHAGOGASTRODUODENOSCOPY (EGD);  Surgeon: Clarene Essex, MD;   Location: [facility] ENDOSCOPY;  Service: Endoscopy;  Laterality: N/A;  ? g tube discontinued  04/2020  ? per patient  ? GASTROSTOMY TUBE PLACEMENT  11/21/2015  ? REDUCTION OF HIATAL HERNIA , REPAIR HIATAL HERNIA, RESECTION SMALL BOWEL WITH ANASTOMOSIS, PLACEMENT GASTROSTOMY TUBE, PLACEMENT DUODENOSTOMY TUBE (N/A)  ? HEMORRHOID BANDING  X 2  ? HERNIA REPAIR    ? HIATAL HERNIA REPAIR N/A 11/21/2015  ? Procedure: REDUCTION OF HIATAL HERNIA , REPAIR HIATAL HERNIA, RESECTION SMALL BOWEL WITH ANASTOMOSIS, PLACEMENT GASTROSTOMY TUBE, PLACEMENT DUODENOSTOMY TUBE;  Surgeon: Mickeal Skinner, MD;  Location: [place];  Service: General;  Laterality: N/A;  ? HOLMIUM LASER APPLICATION Right 77/41/2878  ? Procedure: HOLMIUM LASER APPLICATION;  Surgeon: Remi Haggard, MD;  [facility];  Service: Urology;  Laterality: Right;  ? [place] REPAIR  06/28/2016  ? open/notes 07/15/2016  ? INCISIONAL HERNIA REPAIR  03/17/2018  ? WITH MESH  ? INCISIONAL HERNIA REPAIR N/A 03/17/2018  ? Procedure: INCISIONAL HERNIA REPAIR WITH MESH;  Surgeon: [name], MD;  Location: [place];  Service: General;  Laterality: N/A;  ? [place] N/A 10/16/2020  ? Procedure: INCISIONAL HERNIA REPAIR WITH MESH;  Surgeon: [name], MD;  Location: [place];  Service: General;  Laterality: N/A;  ? INGUINAL HERNIA REPAIR Bilateral 09/28/2018  ? INGUINAL HERNIA REPAIR Bilateral 09/28/2018  ? Procedure: BILATERAL OPEN INGUINAL HERNIA REPAIR WITH MESH;  Surgeon: [name], MD;  Location: [place];  Service: General;  Laterality: Bilateral;  GENERAL AND TAP BLOCK  ? INSERTION OF MESH N/A 03/17/2018  ? Procedure: INSERTION OF MESH;  Surgeon: [name], MD;  Location: [place];  Service: General;  Laterality: N/A;  ? INSERTION OF MESH Bilateral 09/28/2018  ? Procedure: Insertion Of Mesh;  Surgeon: [name], MD;  Location: [place];  Service: General;  Laterality: Bilateral;  ? IR CM INJ ANY COLONIC TUBE W/FLUORO  02/04/2017  ?  IR GUIDED DRAIN W CATHETER PLACEMENT  07/06/2016  ? /NOTES 07/15/2016  ? IR PATIENT EVAL TECH 0-60 MINS  06/28/2019  ? IR RADIOLOGIST EVAL & MGMT  07/27/2016  ? IR RADIOLOGIST EVAL & MGMT  08/17/2016  ? IR RADIOLOGIST EVAL & MGMT  08/26/2016  ? IR REPLACE G-TUBE SIMPLE WO FLUORO  07/28/2017  ? IR REPLACE G-TUBE SIMPLE WO FLUORO  01/31/2018  ? IR REPLACE G-TUBE SIMPLE WO FLUORO  10/16/2018  ? IR REPLACE G-TUBE SIMPLE WO FLUORO  03/05/2019  ? IR REPLACE G-TUBE SIMPLE WO FLUORO  08/22/2019  ? IR REPLACE G-TUBE SIMPLE WO FLUORO  01/08/2020  ? IR REPLC GASTRO/COLONIC TUBE PERCUT W/FLUORO  08/04/2016  ? IR REPLC GASTRO/COLONIC TUBE PERCUT W/FLUORO  01/14/2017  ? IR US GUIDE BX ASP/DRAIN  08/04/2016  ? KNEE CARTILAGE SURGERY Right 1971  ? "opened me up"  ? LAPAROTOMY N/A 07/05/2015  ? Procedure: PARTIAL SIGMOID COLECTOMY AND COLOSTOMY;  Surgeon: [name], MD;  Location: [place];  Service: General;  Laterality: N/A;  ? MELANOMA EXCISION  2001  ? 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  10/30/2015  ? Procedure: REMOVAL OF DISTAL STOMACH;  Surgeon: Judeth Horn, MD;  Location: [place];  Service: General;;  ? REPAIR OF PERFORATED ULCER N/A 10/30/2015  ? Procedure: REPAIR OF BLEEDING  ULCER;  Surgeon: Judeth Horn, MD;  Location: [place];  Service: General;  Laterality: N/A;  ? TUMOR EXCISION  2009  ? "back; fatty tumor"  ? ?Patient Active Problem List  ? Diagnosis Date Noted  ? Effusion, right knee   ? 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 Bilateral inguinal hernia 09/28/2018  ? Incisional hernia 03/17/2018  ? Trigger thumb, left thumb 01/19/2018  ? Trigger finger, left index finger 07/18/2017  ? Trigger finger, right ring finger 01/31/2017  ? Trigger finger of left thumb 01/03/2017  ? Trigger finger of right thumb 01/03/2017  ? Trigger index finger of left hand 01/03/2017  ? Trigger index finger of right hand 01/03/2017  ? Malnutrition of moderate degree 07/19/2016  ? Intra-abdominal abscess ([place]) 07/16/2016   ? Cellulitis 07/15/2016  ? S/P colostomy takedown 06/28/2016  ? Pain in thoracic spine 06/09/2016  ? Mid back pain 06/09/2016  ? Postoperative fever 11/19/2015  ? 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  ? Severe protein-calorie malnutrition ([place]) 11/17/2015  ? Sepsis ([place]) 11/14/2015  ? Hiccups 11/14/2015  ? AKI (acute kidney injury) ([place]) 11/14/2015  ? Fever   ? Leg swelling   ? Left shoulder pain   ? Muscle spasm of left shoulder   ? GI bleed 10/27/2015  ? Acute blood loss anemia 10/27/2015  ? Syncope 10/27/2015  ? Hyperglycemia 10/27/2015  ? Hypotension 10/27/2015  ? Neck pain 10/27/2015  ? Hematemesis 10/27/2015  ? Hematochezia 10/27/2015  ? Diverticulitis of colon with perforation 07/05/2015  ? ? ?PCP: Alroy Dust, L.Marlou Sa, MD ? ?REFERRING PROVIDER: Alroy Dust, L.Marlou Sa, MD ? ?REFERRING DIAG:  ?M25.512,G89.29 (ICD-10-CM) -  Chronic left shoulder pain  ?M25.511,G89.29 (ICD-10-CM) - Chronic right shoulder pain  ? ? ?THERAPY DIAG:  ?Bilateral shoulder pain  ?Abnormal posture  ? ?ONSET DATE: Around January had an injection that lasted a while but wore off.   ? ?SUBJECTIVE:                                                                                                                                                                                     ? ?SUBJECTIVE STATEMENT:   It's been pretty good over the weekend.  Driving in the car bothered a little today.  I did ride my bike the other day.   ? ?PERTINENT HISTORY: ? ? ?PAIN:  ?Are you having pain? Yes: NPRS scale: 5/10 with movement  ?Pain location: right shoulder and left thoracic middle  ?Pain description: aching  ?Aggravating factors: reaching overhead and weed eating  ?Relieving factors: not using the shoulder  ? ?PRECAUTIONS: None ? ?WEIGHT BEARING RESTRICTIONS No ? ?FALLS:  ?Has patient fallen in last 6 months? No ? ?LIVING ENVIRONMENT: ?Nothing pertinent  ? ?OCCUPATION: ?Retired  ? ?Hobbies:  ?Enjoys working in the yard  ? ?PLOF: Independent ? ?PATIENT GOALS  ?To  have less pain  ?OBJECTIVE:  ? ?DIAGNOSTIC FINDINGS:  ?Right shoulder x-ray  ? ? ?3 views of the right shoulder show no acute findings. ? ?Left shoulder x-ray  ?The humeral head is  ?just slightly high riding.

## 2021-07-21 DIAGNOSIS — Z20822 Contact with and (suspected) exposure to covid-19: Secondary | ICD-10-CM | POA: Diagnosis not present

## 2021-07-28 ENCOUNTER — Ambulatory Visit (HOSPITAL_BASED_OUTPATIENT_CLINIC_OR_DEPARTMENT_OTHER): Payer: Medicare Other | Attending: Orthopaedic Surgery | Admitting: Physical Therapy

## 2021-07-28 ENCOUNTER — Encounter (HOSPITAL_BASED_OUTPATIENT_CLINIC_OR_DEPARTMENT_OTHER): Payer: Self-pay | Admitting: Physical Therapy

## 2021-07-28 DIAGNOSIS — M25511 Pain in right shoulder: Secondary | ICD-10-CM | POA: Insufficient documentation

## 2021-07-28 DIAGNOSIS — R293 Abnormal posture: Secondary | ICD-10-CM | POA: Insufficient documentation

## 2021-07-28 DIAGNOSIS — G8929 Other chronic pain: Secondary | ICD-10-CM | POA: Insufficient documentation

## 2021-07-28 DIAGNOSIS — M25512 Pain in left shoulder: Secondary | ICD-10-CM | POA: Insufficient documentation

## 2021-07-28 NOTE — Therapy (Signed)
?OUTPATIENT PHYSICAL THERAPY SHOULDER EVALUATION ? ? ?Patient Name: Miguel [name] ?MRN: [id number] ?DOB:[birth year], 71 y.o., male ?Today's Date: 07/28/2021 ? ? PT End of Session - 07/28/21 1220   ? ? Visit Number 5   ? Number of Visits 6   ? Date for PT Re-Evaluation 08/13/21   ? PT Start Time 1145   ? PT Stop Time 1228   ? PT Time Calculation (min) 43 min   ? Activity Tolerance Patient tolerated treatment well   ? Behavior During Therapy [facility] for tasks assessed/performed   ? ?  ?  ? ?  ? ? ?Past Medical History:  ?Diagnosis Date  ? Anemia   ? Anxiety   ? BPH (benign prostatic hyperplasia)   ? Gastritis   ? GERD (gastroesophageal reflux disease)   ? "seldom" (07/16/2016)  ? GI bleed due to NSAIDs 10/27/2015  ? History of blood transfusion 06/2016  ? post OR/notes 07/15/2016  ? History of hiatal hernia   ? History of kidney stones   ? surgery to remove stone  ? Hyperlipidemia   ? Hypertension   ? no meds   ? Melanoma of back ([place])   ? "mid back"  ? Sigmoid diverticulitis   ? with perforation  ? ?Past Surgical History:  ?Procedure Laterality Date  ? COLON SURGERY    ? sigmoid  ? COLOSTOMY TAKEDOWN N/A 06/28/2016  ? Procedure: COLOSTOMY TAKEDOWN;  Surgeon: [name], MD;  Location: [place];  Service: General;  Laterality: N/A;  ? CYSTOSCOPY WITH RETROGRADE PYELOGRAM, URETEROSCOPY AND STENT PLACEMENT Bilateral 02/12/2020  ? Procedure: CYSTOSCOPY WITH BILATERAL RETROGRADE PYELOGRAM,  AND LITHOPEXY;  Surgeon: Remi Haggard, MD;  [facility];  Service: Urology;  Laterality: Bilateral;  1 HR  ? ESOPHAGOGASTRODUODENOSCOPY N/A 10/27/2015  ? Procedure: ESOPHAGOGASTRODUODENOSCOPY (EGD);  Surgeon: Clarene Essex, MD;  Location: [facility] ENDOSCOPY;  Service: Endoscopy;  Laterality: N/A;  ? ESOPHAGOGASTRODUODENOSCOPY N/A 10/29/2015  ? Procedure: ESOPHAGOGASTRODUODENOSCOPY (EGD);  Surgeon: Ronald Lobo, MD;  Location: [facility] ENDOSCOPY;  Service: Endoscopy;  Laterality: N/A;  ? ESOPHAGOGASTRODUODENOSCOPY N/A 10/30/2015  ? Procedure:  ESOPHAGOGASTRODUODENOSCOPY (EGD);  Surgeon: Clarene Essex, MD;  Location: [facility] ENDOSCOPY;  Service: Endoscopy;  Laterality: N/A;  ? g tube discontinued  04/2020  ? per patient  ? GASTROSTOMY TUBE PLACEMENT  11/21/2015  ? REDUCTION OF HIATAL HERNIA , REPAIR HIATAL HERNIA, RESECTION SMALL BOWEL WITH ANASTOMOSIS, PLACEMENT GASTROSTOMY TUBE, PLACEMENT DUODENOSTOMY TUBE (N/A)  ? HEMORRHOID BANDING  X 2  ? HERNIA REPAIR    ? HIATAL HERNIA REPAIR N/A 11/21/2015  ? Procedure: REDUCTION OF HIATAL HERNIA , REPAIR HIATAL HERNIA, RESECTION SMALL BOWEL WITH ANASTOMOSIS, PLACEMENT GASTROSTOMY TUBE, PLACEMENT DUODENOSTOMY TUBE;  Surgeon: Mickeal Skinner, MD;  Location: [place];  Service: General;  Laterality: N/A;  ? HOLMIUM LASER APPLICATION Right 83/38/2505  ? Procedure: HOLMIUM LASER APPLICATION;  Surgeon: Remi Haggard, MD;  [facility];  Service: Urology;  Laterality: Right;  ? [place] REPAIR  06/28/2016  ? open/notes 07/15/2016  ? INCISIONAL HERNIA REPAIR  03/17/2018  ? WITH MESH  ? INCISIONAL HERNIA REPAIR N/A 03/17/2018  ? Procedure: INCISIONAL HERNIA REPAIR WITH MESH;  Surgeon: [name], MD;  Location: [place];  Service: General;  Laterality: N/A;  ? [place] N/A 10/16/2020  ? Procedure: INCISIONAL HERNIA REPAIR WITH MESH;  Surgeon: [name], MD;  Location: [place];  Service: General;  Laterality: N/A;  ? INGUINAL HERNIA REPAIR Bilateral 09/28/2018  ? INGUINAL HERNIA REPAIR Bilateral 09/28/2018  ?  Procedure: BILATERAL OPEN INGUINAL HERNIA REPAIR WITH MESH;  Surgeon: [name], MD;  Location: [place];  Service: General;  Laterality: Bilateral;  GENERAL AND TAP BLOCK  ? INSERTION OF MESH N/A 03/17/2018  ? Procedure: INSERTION OF MESH;  Surgeon: [name], MD;  Location: [place];  Service: General;  Laterality: N/A;  ? INSERTION OF MESH Bilateral 09/28/2018  ? Procedure: Insertion Of Mesh;  Surgeon: [name], MD;  Location: [place];  Service: General;  Laterality:  Bilateral;  ? IR CM INJ ANY COLONIC TUBE W/FLUORO  02/04/2017  ? IR GUIDED DRAIN W CATHETER PLACEMENT  07/06/2016  ? /NOTES 07/15/2016  ? IR PATIENT EVAL TECH 0-60 MINS  06/28/2019  ? IR RADIOLOGIST EVAL & MGMT  07/27/2016  ? IR RADIOLOGIST EVAL & MGMT  08/17/2016  ? IR RADIOLOGIST EVAL & MGMT  08/26/2016  ? IR REPLACE G-TUBE SIMPLE WO FLUORO  07/28/2017  ? IR REPLACE G-TUBE SIMPLE WO FLUORO  01/31/2018  ? IR REPLACE G-TUBE SIMPLE WO FLUORO  10/16/2018  ? IR REPLACE G-TUBE SIMPLE WO FLUORO  03/05/2019  ? IR REPLACE G-TUBE SIMPLE WO FLUORO  08/22/2019  ? IR REPLACE G-TUBE SIMPLE WO FLUORO  01/08/2020  ? IR REPLC GASTRO/COLONIC TUBE PERCUT W/FLUORO  08/04/2016  ? IR REPLC GASTRO/COLONIC TUBE PERCUT W/FLUORO  01/14/2017  ? IR US GUIDE BX ASP/DRAIN  08/04/2016  ? KNEE CARTILAGE SURGERY Right 1971  ? "opened me up"  ? LAPAROTOMY N/A 07/05/2015  ? Procedure: PARTIAL SIGMOID COLECTOMY AND COLOSTOMY;  Surgeon: [name], MD;  Location: [place];  Service: General;  Laterality: N/A;  ? MELANOMA EXCISION  2001  ? 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  10/30/2015  ? Procedure: REMOVAL OF DISTAL STOMACH;  Surgeon: Judeth Horn, MD;  Location: [place];  Service: General;;  ? REPAIR OF PERFORATED ULCER N/A 10/30/2015  ? Procedure: REPAIR OF BLEEDING  ULCER;  Surgeon: Judeth Horn, MD;  Location: [place];  Service: General;  Laterality: N/A;  ? TUMOR EXCISION  2009  ? "back; fatty tumor"  ? ?Patient Active Problem List  ? Diagnosis Date Noted  ? Effusion, right knee   ? 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 Bilateral inguinal hernia 09/28/2018  ? Incisional hernia 03/17/2018  ? Trigger thumb, left thumb 01/19/2018  ? Trigger finger, left index finger 07/18/2017  ? Trigger finger, right ring finger 01/31/2017  ? Trigger finger of left thumb 01/03/2017  ? Trigger finger of right thumb 01/03/2017  ? Trigger index finger of left hand 01/03/2017  ? Trigger index finger of right hand 01/03/2017  ? Malnutrition of moderate  degree 07/19/2016  ? Intra-abdominal abscess ([place]) 07/16/2016  ? Cellulitis 07/15/2016  ? S/P colostomy takedown 06/28/2016  ? Pain in thoracic spine 06/09/2016  ? Mid back pain 06/09/2016  ? Postoperative fever 11/19/2015  ? 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  ? Severe protein-calorie malnutrition ([place]) 11/17/2015  ? Sepsis ([place]) 11/14/2015  ? Hiccups 11/14/2015  ? AKI (acute kidney injury) ([place]) 11/14/2015  ? Fever   ? Leg swelling   ? Left shoulder pain   ? Muscle spasm of left shoulder   ? GI bleed 10/27/2015  ? Acute blood loss anemia 10/27/2015  ? Syncope 10/27/2015  ? Hyperglycemia 10/27/2015  ? Hypotension 10/27/2015  ? Neck pain 10/27/2015  ? Hematemesis 10/27/2015  ? Hematochezia 10/27/2015  ? Diverticulitis of colon with perforation 07/05/2015  ? ? ?PCP: Alroy Dust, L.Marlou Sa, MD ? ?REFERRING PROVIDER: Dr  Jean Rosenthal  ? ?REFERRING DIAG:  ?M25.512,G89.29 (ICD-10-CM) - Chronic left shoulder pain  ?M25.511,G89.29 (ICD-10-CM) - Chronic right shoulder pain  ? ? ?THERAPY DIAG:  ?Bilateral shoulder pain  ?Abnormal posture  ? ?ONSET DATE: Around January had an injection that lasted a while but wore off.   ? ?SUBJECTIVE:                                                                                                                                                                                     ? ?SUBJECTIVE STATEMENT:    ?The patient reports he is still having some pain in his upper trap but it is better. He is having pain in his left mid thoracic spine. He reports both are better though.  ? ?PERTINENT HISTORY: ? ? ?PAIN:  ?Are you having pain? Yes: NPRS scale: 3/10 with movement 5/2 ?Pain location: right shoulder and left thoracic middle  ?Pain description: aching  ?Aggravating factors: reaching overhead and weed eating  ?Relieving factors: not using the shoulder  ? ?PRECAUTIONS: None ? ?WEIGHT BEARING RESTRICTIONS No ? ?FALLS:  ?Has patient fallen in last 6 months? No ? ?LIVING ENVIRONMENT: ?Nothing  pertinent  ? ?OCCUPATION: ?Retired  ? ?Hobbies:  ?Enjoys working in the yard  ? ?PLOF: Independent ? ?PATIENT GOALS  ?To have less pain  ?OBJECTIVE:  ? ?DIAGNOSTIC FINDINGS:  ?Right shoulder x-ray  ? ? ?3 views of

## 2021-07-29 ENCOUNTER — Encounter (HOSPITAL_BASED_OUTPATIENT_CLINIC_OR_DEPARTMENT_OTHER): Payer: Self-pay | Admitting: Physical Therapy

## 2021-07-31 DIAGNOSIS — Z20822 Contact with and (suspected) exposure to covid-19: Secondary | ICD-10-CM | POA: Diagnosis not present

## 2021-08-03 DIAGNOSIS — R972 Elevated prostate specific antigen [PSA]: Secondary | ICD-10-CM | POA: Diagnosis not present

## 2021-08-04 ENCOUNTER — Encounter (HOSPITAL_BASED_OUTPATIENT_CLINIC_OR_DEPARTMENT_OTHER): Payer: Self-pay | Admitting: Physical Therapy

## 2021-08-04 ENCOUNTER — Ambulatory Visit (HOSPITAL_BASED_OUTPATIENT_CLINIC_OR_DEPARTMENT_OTHER): Payer: Medicare Other | Admitting: Physical Therapy

## 2021-08-04 DIAGNOSIS — M25512 Pain in left shoulder: Secondary | ICD-10-CM | POA: Diagnosis not present

## 2021-08-04 DIAGNOSIS — R293 Abnormal posture: Secondary | ICD-10-CM | POA: Diagnosis not present

## 2021-08-04 DIAGNOSIS — G8929 Other chronic pain: Secondary | ICD-10-CM | POA: Diagnosis not present

## 2021-08-04 DIAGNOSIS — M25511 Pain in right shoulder: Secondary | ICD-10-CM | POA: Diagnosis not present

## 2021-08-04 DIAGNOSIS — Z20822 Contact with and (suspected) exposure to covid-19: Secondary | ICD-10-CM | POA: Diagnosis not present

## 2021-08-04 NOTE — Therapy (Signed)
?OUTPATIENT PHYSICAL THERAPY SHOULDER EVALUATION ? ? ?Patient Name: Miguel [name] ?MRN: [id number] ?DOB:[birth year], 71 y.o., male ?Today's Date: 08/05/2021 ? ? PT End of Session - 08/04/21 1118   ? ? Visit Number 6   ? Number of Visits 12   ? Date for PT Re-Evaluation 09/15/21   ? PT Start Time 1100   ? PT Stop Time 3235   ? PT Time Calculation (min) 43 min   ? Activity Tolerance Patient tolerated treatment well   ? Behavior During Therapy [facility] for tasks assessed/performed   ? ?  ?  ? ?  ? ? ?Past Medical History:  ?Diagnosis Date  ? Anemia   ? Anxiety   ? BPH (benign prostatic hyperplasia)   ? Gastritis   ? GERD (gastroesophageal reflux disease)   ? "seldom" (07/16/2016)  ? GI bleed due to NSAIDs 10/27/2015  ? History of blood transfusion 06/2016  ? post OR/notes 07/15/2016  ? History of hiatal hernia   ? History of kidney stones   ? surgery to remove stone  ? Hyperlipidemia   ? Hypertension   ? no meds   ? Melanoma of back ([place])   ? "mid back"  ? Sigmoid diverticulitis   ? with perforation  ? ?Past Surgical History:  ?Procedure Laterality Date  ? COLON SURGERY    ? sigmoid  ? COLOSTOMY TAKEDOWN N/A 06/28/2016  ? Procedure: COLOSTOMY TAKEDOWN;  Surgeon: [name], MD;  Location: [place];  Service: General;  Laterality: N/A;  ? CYSTOSCOPY WITH RETROGRADE PYELOGRAM, URETEROSCOPY AND STENT PLACEMENT Bilateral 02/12/2020  ? Procedure: CYSTOSCOPY WITH BILATERAL RETROGRADE PYELOGRAM,  AND LITHOPEXY;  Surgeon: Remi Haggard, MD;  [facility];  Service: Urology;  Laterality: Bilateral;  1 HR  ? ESOPHAGOGASTRODUODENOSCOPY N/A 10/27/2015  ? Procedure: ESOPHAGOGASTRODUODENOSCOPY (EGD);  Surgeon: Clarene Essex, MD;  Location: [facility] ENDOSCOPY;  Service: Endoscopy;  Laterality: N/A;  ? ESOPHAGOGASTRODUODENOSCOPY N/A 10/29/2015  ? Procedure: ESOPHAGOGASTRODUODENOSCOPY (EGD);  Surgeon: Ronald Lobo, MD;  Location: [facility] ENDOSCOPY;  Service: Endoscopy;  Laterality: N/A;  ? ESOPHAGOGASTRODUODENOSCOPY N/A 10/30/2015  ? Procedure:  ESOPHAGOGASTRODUODENOSCOPY (EGD);  Surgeon: Clarene Essex, MD;  Location: [facility] ENDOSCOPY;  Service: Endoscopy;  Laterality: N/A;  ? g tube discontinued  04/2020  ? per patient  ? GASTROSTOMY TUBE PLACEMENT  11/21/2015  ? REDUCTION OF HIATAL HERNIA , REPAIR HIATAL HERNIA, RESECTION SMALL BOWEL WITH ANASTOMOSIS, PLACEMENT GASTROSTOMY TUBE, PLACEMENT DUODENOSTOMY TUBE (N/A)  ? HEMORRHOID BANDING  X 2  ? HERNIA REPAIR    ? HIATAL HERNIA REPAIR N/A 11/21/2015  ? Procedure: REDUCTION OF HIATAL HERNIA , REPAIR HIATAL HERNIA, RESECTION SMALL BOWEL WITH ANASTOMOSIS, PLACEMENT GASTROSTOMY TUBE, PLACEMENT DUODENOSTOMY TUBE;  Surgeon: Mickeal Skinner, MD;  Location: [place];  Service: General;  Laterality: N/A;  ? HOLMIUM LASER APPLICATION Right 57/32/2025  ? Procedure: HOLMIUM LASER APPLICATION;  Surgeon: Remi Haggard, MD;  [facility];  Service: Urology;  Laterality: Right;  ? [place] REPAIR  06/28/2016  ? open/notes 07/15/2016  ? INCISIONAL HERNIA REPAIR  03/17/2018  ? WITH MESH  ? INCISIONAL HERNIA REPAIR N/A 03/17/2018  ? Procedure: INCISIONAL HERNIA REPAIR WITH MESH;  Surgeon: [name], MD;  Location: [place];  Service: General;  Laterality: N/A;  ? [place] N/A 10/16/2020  ? Procedure: INCISIONAL HERNIA REPAIR WITH MESH;  Surgeon: [name], MD;  Location: [place];  Service: General;  Laterality: N/A;  ? INGUINAL HERNIA REPAIR Bilateral 09/28/2018  ? INGUINAL HERNIA REPAIR Bilateral 09/28/2018  ?  Procedure: BILATERAL OPEN INGUINAL HERNIA REPAIR WITH MESH;  Surgeon: [name], MD;  Location: [place];  Service: General;  Laterality: Bilateral;  GENERAL AND TAP BLOCK  ? INSERTION OF MESH N/A 03/17/2018  ? Procedure: INSERTION OF MESH;  Surgeon: [name], MD;  Location: [place];  Service: General;  Laterality: N/A;  ? INSERTION OF MESH Bilateral 09/28/2018  ? Procedure: Insertion Of Mesh;  Surgeon: [name], MD;  Location: [place];  Service: General;  Laterality:  Bilateral;  ? IR CM INJ ANY COLONIC TUBE W/FLUORO  02/04/2017  ? IR GUIDED DRAIN W CATHETER PLACEMENT  07/06/2016  ? /NOTES 07/15/2016  ? IR PATIENT EVAL TECH 0-60 MINS  06/28/2019  ? IR RADIOLOGIST EVAL & MGMT  07/27/2016  ? IR RADIOLOGIST EVAL & MGMT  08/17/2016  ? IR RADIOLOGIST EVAL & MGMT  08/26/2016  ? IR REPLACE G-TUBE SIMPLE WO FLUORO  07/28/2017  ? IR REPLACE G-TUBE SIMPLE WO FLUORO  01/31/2018  ? IR REPLACE G-TUBE SIMPLE WO FLUORO  10/16/2018  ? IR REPLACE G-TUBE SIMPLE WO FLUORO  03/05/2019  ? IR REPLACE G-TUBE SIMPLE WO FLUORO  08/22/2019  ? IR REPLACE G-TUBE SIMPLE WO FLUORO  01/08/2020  ? IR REPLC GASTRO/COLONIC TUBE PERCUT W/FLUORO  08/04/2016  ? IR REPLC GASTRO/COLONIC TUBE PERCUT W/FLUORO  01/14/2017  ? IR US GUIDE BX ASP/DRAIN  08/04/2016  ? KNEE CARTILAGE SURGERY Right 1971  ? "opened me up"  ? LAPAROTOMY N/A 07/05/2015  ? Procedure: PARTIAL SIGMOID COLECTOMY AND COLOSTOMY;  Surgeon: [name], MD;  Location: [place];  Service: General;  Laterality: N/A;  ? MELANOMA EXCISION  2001  ? 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  10/30/2015  ? Procedure: REMOVAL OF DISTAL STOMACH;  Surgeon: Judeth Horn, MD;  Location: [place];  Service: General;;  ? REPAIR OF PERFORATED ULCER N/A 10/30/2015  ? Procedure: REPAIR OF BLEEDING  ULCER;  Surgeon: Judeth Horn, MD;  Location: [place];  Service: General;  Laterality: N/A;  ? TUMOR EXCISION  2009  ? "back; fatty tumor"  ? ?Patient Active Problem List  ? Diagnosis Date Noted  ? Effusion, right knee   ? 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 Bilateral inguinal hernia 09/28/2018  ? Incisional hernia 03/17/2018  ? Trigger thumb, left thumb 01/19/2018  ? Trigger finger, left index finger 07/18/2017  ? Trigger finger, right ring finger 01/31/2017  ? Trigger finger of left thumb 01/03/2017  ? Trigger finger of right thumb 01/03/2017  ? Trigger index finger of left hand 01/03/2017  ? Trigger index finger of right hand 01/03/2017  ? Malnutrition of moderate  degree 07/19/2016  ? Intra-abdominal abscess ([place]) 07/16/2016  ? Cellulitis 07/15/2016  ? S/P colostomy takedown 06/28/2016  ? Pain in thoracic spine 06/09/2016  ? Mid back pain 06/09/2016  ? Postoperative fever 11/19/2015  ? 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  ? Severe protein-calorie malnutrition ([place]) 11/17/2015  ? Sepsis ([place]) 11/14/2015  ? Hiccups 11/14/2015  ? AKI (acute kidney injury) ([place]) 11/14/2015  ? Fever   ? Leg swelling   ? Left shoulder pain   ? Muscle spasm of left shoulder   ? GI bleed 10/27/2015  ? Acute blood loss anemia 10/27/2015  ? Syncope 10/27/2015  ? Hyperglycemia 10/27/2015  ? Hypotension 10/27/2015  ? Neck pain 10/27/2015  ? Hematemesis 10/27/2015  ? Hematochezia 10/27/2015  ? Diverticulitis of colon with perforation 07/05/2015  ? ? ?PCP: Alroy Dust, L.Marlou Sa, MD ? ?REFERRING PROVIDER: Dr  Jean Rosenthal  ? ?REFERRING DIAG:  ?M25.512,G89.29 (ICD-10-CM) - Chronic left shoulder pain  ?M25.511,G89.29 (ICD-10-CM) - Chronic right shoulder pain  ? ? ?THERAPY DIAG:  ?Bilateral shoulder pain  ?Abnormal posture  ? ?ONSET DATE: Around January had an injection that lasted a while but wore off.   ? ?SUBJECTIVE:                                                                                                                                                                                     ? ?SUBJECTIVE STATEMENT:    ?The patient reports his shoulder is doing a little better. He is having less pain.  ?PERTINENT HISTORY: ? ? ?PAIN:  ?Are you having pain? Yes: NPRS scale: 3/10 with movement 5/2 ?Pain location: right shoulder and left thoracic middle  ?Pain description: aching  ?Aggravating factors: reaching overhead and weed eating  ?Relieving factors: not using the shoulder  ? ?PRECAUTIONS: None ? ?WEIGHT BEARING RESTRICTIONS No ? ?FALLS:  ?Has patient fallen in last 6 months? No ? ?LIVING ENVIRONMENT: ?Nothing pertinent  ? ?OCCUPATION: ?Retired  ? ?Hobbies:  ?Enjoys working in the yard  ? ?PLOF:  Independent ? ?PATIENT GOALS  ?To have less pain  ?OBJECTIVE:  ? ?  ?UPPER EXTREMITY ROM:  ?  ?Active ROM Right ?07/03/2021 Left ?07/03/2021 Right  ?5/9 Left ?5/9  ?Shoulder flexion 140 135 150 156 ?  ?Shoulder extens

## 2021-08-10 ENCOUNTER — Ambulatory Visit (INDEPENDENT_AMBULATORY_CARE_PROVIDER_SITE_OTHER): Payer: Medicare Other | Admitting: Orthopaedic Surgery

## 2021-08-10 ENCOUNTER — Ambulatory Visit: Payer: Medicare Other | Admitting: Orthopaedic Surgery

## 2021-08-10 ENCOUNTER — Encounter: Payer: Self-pay | Admitting: Orthopaedic Surgery

## 2021-08-10 DIAGNOSIS — M25512 Pain in left shoulder: Secondary | ICD-10-CM | POA: Diagnosis not present

## 2021-08-10 DIAGNOSIS — M25511 Pain in right shoulder: Secondary | ICD-10-CM

## 2021-08-10 DIAGNOSIS — G8929 Other chronic pain: Secondary | ICD-10-CM | POA: Diagnosis not present

## 2021-08-10 DIAGNOSIS — R972 Elevated prostate specific antigen [PSA]: Secondary | ICD-10-CM | POA: Diagnosis not present

## 2021-08-10 DIAGNOSIS — N400 Enlarged prostate without lower urinary tract symptoms: Secondary | ICD-10-CM | POA: Diagnosis not present

## 2021-08-10 MED ORDER — LIDOCAINE HCL 1 % IJ SOLN
3.0000 mL | INTRAMUSCULAR | Status: AC | PRN
  Administered 2021-08-10: 3 mL

## 2021-08-10 MED ORDER — METHYLPREDNISOLONE ACETATE 40 MG/ML IJ SUSP
40.0000 mg | INTRAMUSCULAR | Status: AC | PRN
  Administered 2021-08-10: 40 mg via INTRA_ARTICULAR

## 2021-08-10 NOTE — Telephone Encounter (Signed)
err

## 2021-08-10 NOTE — Progress Notes (Signed)
? ?Office Visit Note ?  ?Patient: Miguel [name]           ?Date of Birth: 11-20-1950           ?MRN: [id number] ?Visit Date: 08/10/2021 ?             ?Requested by: Alroy Dust, L.Marlou Sa, MD ?[address] ?[address] ?[place],  [place] [address] ?PCP: Alroy Dust, L.Marlou Sa, MD ? ? ?Assessment & Plan: ?Visit Diagnoses:  ?1. Chronic left shoulder pain   ?2. Chronic right shoulder pain   ? ? ?Plan: I did place a steroid injection in his right shoulder subacromial outlet which she tolerated well.  I told him to wait at least 4 months between injections.  All questions and concerns were answered and addressed.  Follow-up at this point is as needed. ? ?Follow-Up Instructions: Return if symptoms worsen or fail to improve.  ? ?Orders:  ?Orders Placed This Encounter  ?Procedures  ? Large Joint Inj  ? ?No orders of the defined types were placed in this encounter. ? ? ? ? Procedures: ?Large Joint Inj: R subacromial bursa on 08/10/2021 3:41 PM ?Indications: pain and diagnostic evaluation ?Details: 22 G 1.5 in needle ? ?Arthrogram: No ? ?Medications: 3 mL lidocaine 1 %; 40 mg methylPREDNISolone acetate 40 MG/ML ?Outcome: tolerated well, no immediate complications ?Procedure, treatment alternatives, risks and benefits explained, specific risks discussed. Consent was given by the patient. Immediately prior to procedure a time out was called to verify the correct patient, procedure, equipment, support staff and site/side marked as required. Patient was prepped and draped in the usual sterile fashion.  ? ? ? ? ?Clinical Data: ?No additional findings. ? ? ?Subjective: ?Chief Complaint  ?Patient presents with  ? Left Shoulder - Follow-up  ? Right Shoulder - Follow-up  ?The patient comes in here for follow-up as it relates to going to physical therapy for his shoulders.  He said his left shoulder is feeling much better but his right shoulder still somewhat hurting.  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  He does state that therapy has helped.  He would like to at least try a steroid injection in his right shoulder today.  Its been almost 3 months since he has had any type of injection.  He denies any acute changes in medical status.  He is a cachectic individual. ? ?HPI ? ?Review of Systems ?He currently denies any fever, chills, nausea, vomiting ? ?Objective: ?Vital Signs: There were no vitals taken for this visit. ? ?Physical Exam ?He is alert and oriented and in no acute distress ?Ortho Exam ?Examination of both shoulder shows more grinding and limited mobility of the left shoulder and more of just a tendinitis and impingement on the right shoulder.  The right shoulder moves much more smoothly but both shoulders are painful throughout the arc of motion. ?Specialty Comments:  ?MRI LUMBAR SPINE WITHOUT CONTRAST  ?   ?TECHNIQUE:  ?Multiplanar, multisequence MR imaging of the lumbar spine was  ?performed. No intravenous contrast was administered.  ?   ?COMPARISON:  Lumbar spine radiographs 03/11/2020  ?   ?FINDINGS:  ?Segmentation: 5 non rib-bearing lumbar type vertebral bodies are  ?present. The lowest fully formed vertebral body is L5.  ?   ?Alignment: Slight retrolisthesis is present at T12-L1, L1-2 and  ?L2-3. Lumbar lordosis is preserved.  ?   ?Vertebrae:  Marrow signal and vertebral body heights are normal.  ?   ?  Conus medullaris and cauda equina: Conus extends to the L1-2 level.  ?Conus and cauda equina appear normal.  ?   ?Paraspinal and other soft tissues: Layering gallstones noted. No  ?inflammatory change to suggest cholecystitis. 11 mm simple cyst  ?noted posteriorly in the right kidney. No other solid organ lesions  ?are present. No significant adenopathy is present.  ?   ?Disc levels:  ?   ?T12-L1: Mild disc bulging without significant stenosis.  ?   ?L1-2: Retrolisthesis and uncovering of the disc contributes to mild  ?foraminal narrowing bilaterally.  ?   ?L2-3:  Retrolisthesis and uncovering of a broad-based disc protrusion  ?contribute to mild foraminal narrowing bilaterally, left greater  ?than right.  ?   ?L3-4: Broad-based disc protrusion extends into the foramina.  ?Moderate facet hypertrophy is noted bilaterally. Mild central and  ?bilateral foraminal narrowing is present.  ?   ?L4-5: A broad-based disc protrusion is present. Moderate facet  ?hypertrophy is noted. Mild central and mild to moderate foraminal  ?narrowing is worse right than left.  ?   ?L5-S1: Facet and endplate spurring contribute to moderate left and  ?mild right foraminal narrowing.  ?   ?IMPRESSION:  ?1. Multilevel spondylosis of the lumbar spine as described.  ?2. Mild foraminal narrowing bilaterally at L1-2 and L2-3 is worse on  ?the left. Slight retrolisthesis and uncovering of a broad-based disc  ?protrusion contribute at both levels.  ?3. Mild central and bilateral foraminal narrowing at L3-4 secondary  ?to a broad-based disc protrusion and facet hypertrophy.  ?4. Mild central and mild to moderate foraminal narrowing bilaterally  ?at L4-5 is worse right than left.  ?5. Moderate left and mild right foraminal stenosis at L5-S1 due to  ?endplate and facet spurring.  ?6. Cholelithiasis without evidence for cholecystitis.  ?   ?   ?Electronically Signed  ?  By: San Morelle M.D.  ?  On: 12/28/2020 07:23 ? ?Imaging: ?No results found. ? ? ?PMFS History: ?Patient Active Problem List  ? Diagnosis Date Noted  ? Effusion, right knee   ? 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 Bilateral inguinal hernia 09/28/2018  ? Incisional hernia 03/17/2018  ? Trigger thumb, left thumb 01/19/2018  ? Trigger finger, left index finger 07/18/2017  ? Trigger finger, right ring finger 01/31/2017  ? Trigger finger of left thumb 01/03/2017  ? Trigger finger of right thumb 01/03/2017  ? Trigger index finger of left hand 01/03/2017  ? Trigger index finger of right hand 01/03/2017  ? Malnutrition of  moderate degree 07/19/2016  ? Intra-abdominal abscess ([place]) 07/16/2016  ? Cellulitis 07/15/2016  ? S/P colostomy takedown 06/28/2016  ? Pain in thoracic spine 06/09/2016  ? Mid back pain 06/09/2016  ? Postoperative fever 11/19/2015  ? 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  ? Severe protein-calorie malnutrition ([place]) 11/17/2015  ? Sepsis ([place]) 11/14/2015  ? Hiccups 11/14/2015  ? AKI (acute kidney injury) ([place]) 11/14/2015  ? Fever   ? Leg swelling   ? Left shoulder pain   ? Muscle spasm of left shoulder   ? GI bleed 10/27/2015  ? Acute blood loss anemia 10/27/2015  ? Syncope 10/27/2015  ? Hyperglycemia 10/27/2015  ? Hypotension 10/27/2015  ? Neck pain 10/27/2015  ? Hematemesis 10/27/2015  ? Hematochezia 10/27/2015  ? Diverticulitis of colon with perforation 07/05/2015  ? ?Past Medical History:  ?Diagnosis Date  ? Anemia   ? Anxiety   ? BPH (benign prostatic hyperplasia)   ? Gastritis   ?  GERD (gastroesophageal reflux disease)   ? "seldom" (07/16/2016)  ? GI bleed due to NSAIDs 10/27/2015  ? History of blood transfusion 06/2016  ? post OR/notes 07/15/2016  ? History of hiatal hernia   ? History of kidney stones   ? surgery to remove stone  ? Hyperlipidemia   ? Hypertension   ? no meds   ? Melanoma of back ([place])   ? "mid back"  ? Sigmoid diverticulitis   ? with perforation  ?  ?Family History  ?Problem Relation Age of Onset  ? Stroke Mother   ? Stroke Brother   ? Heart disease Brother   ?  ?Past Surgical History:  ?Procedure Laterality Date  ? COLON SURGERY    ? sigmoid  ? COLOSTOMY TAKEDOWN N/A 06/28/2016  ? Procedure: COLOSTOMY TAKEDOWN;  Surgeon: [name], MD;  Location: [facility];  Service: General;  Laterality: N/A;  ? CYSTOSCOPY WITH RETROGRADE PYELOGRAM, URETEROSCOPY AND STENT PLACEMENT Bilateral 02/12/2020  ? Procedure: CYSTOSCOPY WITH BILATERAL RETROGRADE PYELOGRAM,  AND LITHOPEXY;  Surgeon: Remi Haggard, MD;  Location: [facility];  Service: Urology;  Laterality: Bilateral;  1 HR  ? ESOPHAGOGASTRODUODENOSCOPY N/A  10/27/2015  ? Procedure: ESOPHAGOGASTRODUODENOSCOPY (EGD);  Surgeon: Clarene Essex, MD;  [facility]COPY;  Service: Endoscopy;  Laterality: N/A;  ? ESOPHAGOGASTRODUODENOSCOPY N/A 10/29/2015  ? Procedure

## 2021-08-11 DIAGNOSIS — I10 Essential (primary) hypertension: Secondary | ICD-10-CM | POA: Diagnosis not present

## 2021-08-11 DIAGNOSIS — I700 Atherosclerosis of aorta: Secondary | ICD-10-CM | POA: Diagnosis not present

## 2021-08-11 DIAGNOSIS — F419 Anxiety disorder, unspecified: Secondary | ICD-10-CM | POA: Diagnosis not present

## 2021-08-11 DIAGNOSIS — G4700 Insomnia, unspecified: Secondary | ICD-10-CM | POA: Diagnosis not present

## 2021-08-11 DIAGNOSIS — E7800 Pure hypercholesterolemia, unspecified: Secondary | ICD-10-CM | POA: Diagnosis not present

## 2021-08-11 DIAGNOSIS — K219 Gastro-esophageal reflux disease without esophagitis: Secondary | ICD-10-CM | POA: Diagnosis not present

## 2021-08-11 DIAGNOSIS — M545 Low back pain, unspecified: Secondary | ICD-10-CM | POA: Diagnosis not present

## 2021-08-11 DIAGNOSIS — Z23 Encounter for immunization: Secondary | ICD-10-CM | POA: Diagnosis not present

## 2021-08-12 ENCOUNTER — Ambulatory Visit (HOSPITAL_BASED_OUTPATIENT_CLINIC_OR_DEPARTMENT_OTHER): Payer: Medicare Other | Admitting: Physical Therapy

## 2021-08-12 DIAGNOSIS — R293 Abnormal posture: Secondary | ICD-10-CM | POA: Diagnosis not present

## 2021-08-12 DIAGNOSIS — G8929 Other chronic pain: Secondary | ICD-10-CM

## 2021-08-12 DIAGNOSIS — M25511 Pain in right shoulder: Secondary | ICD-10-CM | POA: Diagnosis not present

## 2021-08-12 DIAGNOSIS — M25512 Pain in left shoulder: Secondary | ICD-10-CM | POA: Diagnosis not present

## 2021-08-12 NOTE — Therapy (Signed)
?OUTPATIENT PHYSICAL THERAPY SHOULDER treatment ? ? ?Patient Name: Miguel [name] ?MRN: [id number] ?DOB:February 02, 1951, 71 y.o., male ?Today's Date: 08/12/2021 ? ? PT End of Session - 08/12/21 1319   ? ? Visit Number 7   ? Number of Visits 12   ? Date for PT Re-Evaluation 09/15/21   ? PT Start Time 1318   ? PT Stop Time 7096   ? PT Time Calculation (min) 41 min   ? Activity Tolerance Patient tolerated treatment well   ? ?  ?  ? ?  ? ? ?Past Medical History:  ?Diagnosis Date  ? Anemia   ? Anxiety   ? BPH (benign prostatic hyperplasia)   ? Gastritis   ? GERD (gastroesophageal reflux disease)   ? "seldom" (07/16/2016)  ? GI bleed due to NSAIDs 10/27/2015  ? History of blood transfusion 06/2016  ? post OR/notes 07/15/2016  ? History of hiatal hernia   ? History of kidney stones   ? surgery to remove stone  ? Hyperlipidemia   ? Hypertension   ? no meds   ? Melanoma of back ([place])   ? "mid back"  ? Sigmoid diverticulitis   ? with perforation  ? ?Past Surgical History:  ?Procedure Laterality Date  ? COLON SURGERY    ? sigmoid  ? COLOSTOMY TAKEDOWN N/A 06/28/2016  ? Procedure: COLOSTOMY TAKEDOWN;  Surgeon: [name], MD;  Location: [place];  Service: General;  Laterality: N/A;  ? CYSTOSCOPY WITH RETROGRADE PYELOGRAM, URETEROSCOPY AND STENT PLACEMENT Bilateral 02/12/2020  ? Procedure: CYSTOSCOPY WITH BILATERAL RETROGRADE PYELOGRAM,  AND LITHOPEXY;  Surgeon: Remi Haggard, MD;  Location: [facility];  Service: Urology;  Laterality: Bilateral;  1 HR  ? ESOPHAGOGASTRODUODENOSCOPY N/A 10/27/2015  ? Procedure: ESOPHAGOGASTRODUODENOSCOPY (EGD);  Surgeon: Clarene Essex, MD;  Location[facility] END[facility]COPY;  Service: Endoscopy;  Laterality: N/A;  ? ESOPHAGOGASTRODUODENOSCOPY N/A 10/29/2015  ? Procedure: ESOPHAGOGASTRODUODENOSCOPY (EGD);  Surgeon: Ronald Lobo, MD;  Location: [facility] ENDOSCOPY;  Service: Endoscopy;  Laterality: N/A;  ? ESOPHAGOGASTRODUODENOSCOPY N/A 10/30/2015  ? Procedure: ESOPHAGOGASTRODUODENOSCOPY (EGD);  Surgeon: Clarene Essex, MD;   Location: [facility] ENDOSCOPY;  Service: Endoscopy;  Laterality: N/A;  ? g tube discontinued  04/2020  ? per patient  ? GASTROSTOMY TUBE PLACEMENT  11/21/2015  ? REDUCTION OF HIATAL HERNIA , REPAIR HIATAL HERNIA, RESECTION SMALL BOWEL WITH ANASTOMOSIS, PLACEMENT GASTROSTOMY TUBE, PLACEMENT DUODENOSTOMY TUBE (N/A)  ? HEMORRHOID BANDING  X 2  ? HERNIA REPAIR    ? HIATAL HERNIA REPAIR N/A 11/21/2015  ? Procedure: REDUCTION OF HIATAL HERNIA , REPAIR HIATAL HERNIA, RESECTION SMALL BOWEL WITH ANASTOMOSIS, PLACEMENT GASTROSTOMY TUBE, PLACEMENT DUODENOSTOMY TUBE;  Surgeon: Mickeal Skinner, MD;  Location: [place];  Service: General;  Laterality: N/A;  ? HOLMIUM LASER APPLICATION Right 28/36/6294  ? Procedure: HOLMIUM LASER APPLICATION;  Surgeon: Remi Haggard, MD;  Location: [facility];  Service: Urology;  Laterality: Right;  ? [place] REPAIR  06/28/2016  ? open/notes 07/15/2016  ? INCISIONAL HERNIA REPAIR  03/17/2018  ? WITH MESH  ? INCISIONAL HERNIA REPAIR N/A 03/17/2018  ? Procedure: INCISIONAL HERNIA REPAIR WITH MESH;  Surgeon: [name], MD;  Location: [place];  Service: General;  Laterality: N/A;  ? [place] N/A 10/16/2020  ? Procedure: INCISIONAL HERNIA REPAIR WITH MESH;  Surgeon: [name], MD;  Location: [place];  Service: General;  Laterality: N/A;  ? INGUINAL HERNIA REPAIR Bilateral 09/28/2018  ? INGUINAL HERNIA REPAIR Bilateral 09/28/2018  ? Procedure: BILATERAL OPEN INGUINAL HERNIA REPAIR WITH MESH;  Surgeon: [name], MD;  Location: [place];  Service: General;  Laterality: Bilateral;  GENERAL AND TAP BLOCK  ? INSERTION OF MESH N/A 03/17/2018  ? Procedure: INSERTION OF MESH;  Surgeon: [name], MD;  Location: [place];  Service: General;  Laterality: N/A;  ? INSERTION OF MESH Bilateral 09/28/2018  ? Procedure: Insertion Of Mesh;  Surgeon: [name], MD;  Location: [place];  Service: General;  Laterality: Bilateral;  ? IR CM INJ ANY COLONIC TUBE W/FLUORO  02/04/2017  ?  IR GUIDED DRAIN W CATHETER PLACEMENT  07/06/2016  ? /NOTES 07/15/2016  ? IR PATIENT EVAL TECH 0-60 MINS  06/28/2019  ? IR RADIOLOGIST EVAL & MGMT  07/27/2016  ? IR RADIOLOGIST EVAL & MGMT  08/17/2016  ? IR RADIOLOGIST EVAL & MGMT  08/26/2016  ? IR REPLACE G-TUBE SIMPLE WO FLUORO  07/28/2017  ? IR REPLACE G-TUBE SIMPLE WO FLUORO  01/31/2018  ? IR REPLACE G-TUBE SIMPLE WO FLUORO  10/16/2018  ? IR REPLACE G-TUBE SIMPLE WO FLUORO  03/05/2019  ? IR REPLACE G-TUBE SIMPLE WO FLUORO  08/22/2019  ? IR REPLACE G-TUBE SIMPLE WO FLUORO  01/08/2020  ? IR REPLC GASTRO/COLONIC TUBE PERCUT W/FLUORO  08/04/2016  ? IR REPLC GASTRO/COLONIC TUBE PERCUT W/FLUORO  01/14/2017  ? IR US GUIDE BX ASP/DRAIN  08/04/2016  ? KNEE CARTILAGE SURGERY Right 1971  ? "opened me up"  ? LAPAROTOMY N/A 07/05/2015  ? Procedure: PARTIAL SIGMOID COLECTOMY AND COLOSTOMY;  Surgeon: [name], MD;  Location: [place];  Service: General;  Laterality: N/A;  ? MELANOMA EXCISION  2001  ? 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  10/30/2015  ? Procedure: REMOVAL OF DISTAL STOMACH;  Surgeon: Judeth Horn, MD;  Location: [place];  Service: General;;  ? REPAIR OF PERFORATED ULCER N/A 10/30/2015  ? Procedure: REPAIR OF BLEEDING  ULCER;  Surgeon: Judeth Horn, MD;  Location: [place];  Service: General;  Laterality: N/A;  ? TUMOR EXCISION  2009  ? "back; fatty tumor"  ? ?Patient Active Problem List  ? Diagnosis Date Noted  ? Effusion, right knee   ? 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 Bilateral inguinal hernia 09/28/2018  ? Incisional hernia 03/17/2018  ? Trigger thumb, left thumb 01/19/2018  ? Trigger finger, left index finger 07/18/2017  ? Trigger finger, right ring finger 01/31/2017  ? Trigger finger of left thumb 01/03/2017  ? Trigger finger of right thumb 01/03/2017  ? Trigger index finger of left hand 01/03/2017  ? Trigger index finger of right hand 01/03/2017  ? Malnutrition of moderate degree 07/19/2016  ? Intra-abdominal abscess ([place]) 07/16/2016   ? Cellulitis 07/15/2016  ? S/P colostomy takedown 06/28/2016  ? Pain in thoracic spine 06/09/2016  ? Mid back pain 06/09/2016  ? Postoperative fever 11/19/2015  ? 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  ? Severe protein-calorie malnutrition ([place]) 11/17/2015  ? Sepsis ([place]) 11/14/2015  ? Hiccups 11/14/2015  ? AKI (acute kidney injury) ([place]) 11/14/2015  ? Fever   ? Leg swelling   ? Left shoulder pain   ? Muscle spasm of left shoulder   ? GI bleed 10/27/2015  ? Acute blood loss anemia 10/27/2015  ? Syncope 10/27/2015  ? Hyperglycemia 10/27/2015  ? Hypotension 10/27/2015  ? Neck pain 10/27/2015  ? Hematemesis 10/27/2015  ? Hematochezia 10/27/2015  ? Diverticulitis of colon with perforation 07/05/2015  ? ? ?PCP: Alroy Dust, L.Marlou Sa, MD ? ?REFERRING PROVIDER: Dr Jean Rosenthal  ? ?REFERRING DIAG:  ?M25.512,G89.29 (ICD-10-CM) -  Chronic left shoulder pain  ?M25.511,G89.29 (ICD-10-CM) - Chronic right shoulder pain  ? ? ?THERAPY DIAG:  ?Bilateral shoulder pain  ?Abnormal posture  ? ?ONSET DATE: Around January had an injection that lasted a while but wore off.   ? ?SUBJECTIVE:                                                                                                                                                                                     ? ?SUBJECTIVE STATEMENT:   Mid back pain.  Had an injection in my right shoulder recently.   ? ?PERTINENT HISTORY: ? ? ?PAIN:  ?Are you having pain? Yes: NPRS scale: 5/10 Pain location: right shoulder and left thoracic middle  ?Pain description: aching  ?Aggravating factors: reaching overhead and weed eating  ?Relieving factors: not using the shoulder  ? ?PRECAUTIONS: None ? ?WEIGHT BEARING RESTRICTIONS No ? ?FALLS:  ?Has patient fallen in last 6 months? No ? ?LIVING ENVIRONMENT: ?Nothing pertinent  ? ?OCCUPATION: ?Retired  ? ?Hobbies:  ?Enjoys working in the yard  ? ?PLOF: Independent ? ?PATIENT GOALS  ?To have less pain  ?OBJECTIVE:  ? ?  ?UPPER EXTREMITY ROM:  ?  ?Active ROM  Right ?07/03/2021 Left ?07/03/2021 Right  ?5/9 Left ?5/9  ?Shoulder flexion 140 135 150 156 ?  ?Shoulder extension        ?Shoulder abduction        ?Shoulder adduction        ?Shoulder internal rotation To L4

## 2021-08-26 ENCOUNTER — Encounter (HOSPITAL_BASED_OUTPATIENT_CLINIC_OR_DEPARTMENT_OTHER): Payer: Self-pay

## 2021-08-26 ENCOUNTER — Ambulatory Visit (HOSPITAL_BASED_OUTPATIENT_CLINIC_OR_DEPARTMENT_OTHER): Payer: Medicare Other | Admitting: Physical Therapy

## 2021-09-02 ENCOUNTER — Ambulatory Visit (HOSPITAL_BASED_OUTPATIENT_CLINIC_OR_DEPARTMENT_OTHER): Payer: Medicare Other | Attending: Orthopaedic Surgery | Admitting: Physical Therapy

## 2021-09-02 ENCOUNTER — Encounter (HOSPITAL_BASED_OUTPATIENT_CLINIC_OR_DEPARTMENT_OTHER): Payer: Self-pay | Admitting: Physical Therapy

## 2021-09-02 DIAGNOSIS — M25511 Pain in right shoulder: Secondary | ICD-10-CM | POA: Insufficient documentation

## 2021-09-02 DIAGNOSIS — G8929 Other chronic pain: Secondary | ICD-10-CM | POA: Insufficient documentation

## 2021-09-02 DIAGNOSIS — M25512 Pain in left shoulder: Secondary | ICD-10-CM | POA: Insufficient documentation

## 2021-09-02 DIAGNOSIS — R293 Abnormal posture: Secondary | ICD-10-CM | POA: Diagnosis not present

## 2021-09-02 DIAGNOSIS — M5459 Other low back pain: Secondary | ICD-10-CM | POA: Insufficient documentation

## 2021-09-02 NOTE — Therapy (Signed)
OUTPATIENT PHYSICAL THERAPY SHOULDER treatment   Patient Name: Miguel [name] MRN: [id number] DOB:06/15/1950, 71 y.o., male Today's Date: 09/02/2021     Past Medical History:  Diagnosis Date   Anemia    Anxiety    BPH (benign prostatic hyperplasia)    Gastritis    GERD (gastroesophageal reflux disease)    "seldom" (07/16/2016)   GI bleed due to NSAIDs 10/27/2015   History of blood transfusion 06/2016   post OR/notes 07/15/2016   History of hiatal hernia    History of kidney stones    surgery to remove stone   Hyperlipidemia    Hypertension    no meds    Melanoma of back ([place])    "mid back"   Sigmoid diverticulitis    with perforation   Past Surgical History:  Procedure Laterality Date   COLON SURGERY     sigmoid   COLOSTOMY TAKEDOWN N/A 06/28/2016   Procedure: COLOSTOMY TAKEDOWN;  Surgeon: [name], MD;  Location: [facility];  Service: General;  Laterality: N/A;   CYSTOSCOPY WITH RETROGRADE PYELOGRAM, URETEROSCOPY AND STENT PLACEMENT Bilateral 02/12/2020   Procedure: CYSTOSCOPY WITH BILATERAL RETROGRADE PYELOGRAM,  AND LITHOPEXY;  Surgeon: Remi Haggard, MD;  Location: [facility];  Service: Urology;  Laterality: Bilateral;  1 HR   ESOPHAGOGASTRODUODENOSCOPY N/A 10/27/2015   Procedure: ESOPHAGOGASTRODUODENOSCOPY (EGD);  Surgeon: Clarene [place], MD;  Location: [facility] ENDOSCOPY;  Service: Endoscopy;  Laterality: N/A;   ESOPHAGOGASTRODUODENOSCOPY N/A 10/29/2015   Procedure: ESOPHAGOGASTRODUODENOSCOPY (EGD);  Surgeon: Ronald Lobo, MD;  Location: [facility] ENDOSCOPY;  Service: Endoscopy;  Laterality: N/A;   ESOPHAGOGASTRODUODENOSCOPY N/A 10/30/2015   Procedure: ESOPHAGOGASTRODUODENOSCOPY (EGD);  Surgeon: Clarene [place], MD;  Location: [facility] ENDOSCOPY;  Service: Endoscopy;  Laterality: N/A;   g tube discontinued  04/2020   per patient   [place]  11/21/2015   REDUCTION OF HIATAL HERNIA , REPAIR HIATAL HERNIA, RESECTION SMALL BOWEL WITH ANASTOMOSIS, PLACEMENT GASTROSTOMY TUBE,  PLACEMENT DUODENOSTOMY TUBE (N/A)   HEMORRHOID BANDING  X 2   HERNIA REPAIR     HIATAL HERNIA REPAIR N/A 11/21/2015   Procedure: REDUCTION OF HIATAL HERNIA , REPAIR HIATAL HERNIA, RESECTION SMALL BOWEL WITH ANASTOMOSIS, PLACEMENT GASTROSTOMY TUBE, PLACEMENT DUODENOSTOMY TUBE;  Surgeon: Mickeal Skinner, MD;  Location: [place];  Service: General;  Laterality: N/A;   HOLMIUM LASER APPLICATION Right 81/15/7262   Procedure: HOLMIUM LASER APPLICATION;  Surgeon: Remi Haggard, MD;  Location: [facility];  Service: Urology;  Laterality: Right;   INCISIONAL HERNIA REPAIR  06/28/2016   open/notes 07/15/2016   INCISIONAL HERNIA REPAIR  03/17/2018   WITH MESH   INCISIONAL HERNIA REPAIR N/A 03/17/2018   Procedure: INCISIONAL HERNIA REPAIR WITH MESH;  Surgeon: [name], MD;  Location: [place];  Service: General;  Laterality: N/A;   [place] N/A 10/16/2020   Procedure: Fatima Blank HERNIA REPAIR WITH MESH;  Surgeon: [name], MD;  Location: [place];  Service: General;  Laterality: N/A;   INGUINAL HERNIA REPAIR Bilateral 09/28/2018   INGUINAL HERNIA REPAIR Bilateral 09/28/2018   Procedure: BILATERAL OPEN INGUINAL HERNIA REPAIR WITH MESH;  Surgeon: [name], MD;  Location: [place];  Service: General;  Laterality: Bilateral;  GENERAL AND TAP BLOCK   INSERTION OF MESH N/A 03/17/2018   Procedure: INSERTION OF MESH;  Surgeon: [name], MD;  Location: [place];  Service: General;  Laterality: N/A;   INSERTION OF MESH Bilateral 09/28/2018   Procedure: Insertion Of Mesh;  Surgeon: [name], MD;  Location: [place];  Service: General;  Laterality: Bilateral;   IR CM INJ ANY COLONIC TUBE W/FLUORO  02/04/2017   IR GUIDED DRAIN W CATHETER PLACEMENT  07/06/2016   /NOTES 07/15/2016   IR PATIENT EVAL TECH 0-60 MINS  06/28/2019   IR RADIOLOGIST EVAL & MGMT  07/27/2016   IR RADIOLOGIST EVAL & MGMT  08/17/2016   IR RADIOLOGIST EVAL & MGMT  08/26/2016   IR REPLACE G-TUBE SIMPLE WO FLUORO   07/28/2017   IR REPLACE G-TUBE SIMPLE WO FLUORO  01/31/2018   IR REPLACE G-TUBE SIMPLE WO FLUORO  10/16/2018   IR REPLACE G-TUBE SIMPLE WO FLUORO  03/05/2019   IR REPLACE G-TUBE SIMPLE WO FLUORO  08/22/2019   IR REPLACE G-TUBE SIMPLE WO FLUORO  01/08/2020   IR [place] GASTRO/COLONIC TUBE PERCUT W/FLUORO  08/04/2016   IR [facility] GASTRO/COLONIC TUBE PERCUT W/FLUORO  01/14/2017   IR US GUIDE BX ASP/DRAIN  08/04/2016   KNEE CARTILAGE SURGERY Right 1971   "opened me up"   LAPAROTOMY N/A 07/05/2015   Procedure: PARTIAL SIGMOID COLECTOMY AND COLOSTOMY;  Surgeon: [name], MD;  Location: [place] [facility];  Service: General;  Laterality: N/A;   MELANOMA EXCISION  2001   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  10/30/2015   Procedure: REMOVAL OF DISTAL STOMACH;  Surgeon: Judeth Horn, MD;  Location: [place];  Service: General;;   REPAIR OF PERFORATED ULCER N/A 10/30/2015   Procedure: REPAIR OF BLEEDING  ULCER;  Surgeon: Judeth Horn, MD;  Location: [facility];  Service: General;  Laterality: N/A;   TUMOR EXCISION  2009   "back; fatty tumor"   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   Bilateral inguinal hernia 09/28/2018   Incisional hernia 03/17/2018   Trigger thumb, left thumb 01/19/2018   Trigger finger, left index finger 07/18/2017   Trigger finger, right ring finger 01/31/2017   Trigger finger of left thumb 01/03/2017   Trigger finger of right thumb 01/03/2017   Trigger index finger of left hand 01/03/2017   Trigger index finger of right hand 01/03/2017   Malnutrition of moderate degree 07/19/2016   Intra-abdominal abscess ([place]) 07/16/2016   Cellulitis 07/15/2016   S/P colostomy takedown 06/28/2016   Pain in thoracic spine 06/09/2016   Mid back pain 06/09/2016   Postoperative fever 11/19/2015   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   Severe protein-calorie malnutrition ([place]) 11/17/2015   Sepsis ([place])  11/14/2015   Hiccups 11/14/2015   AKI (acute kidney injury) ([place]) 11/14/2015   Fever    Leg swelling    Left shoulder pain    Muscle spasm of left shoulder    GI bleed 10/27/2015   Acute blood loss anemia 10/27/2015   Syncope 10/27/2015   Hyperglycemia 10/27/2015   Hypotension 10/27/2015   Neck pain 10/27/2015   Hematemesis 10/27/2015   Hematochezia 10/27/2015   Diverticulitis of colon with perforation 07/05/2015    PCP: Alroy Dust, L.Marlou Sa, MD  REFERRING PROVIDER: Dr Jean Rosenthal   REFERRING DIAG:  [phone] (ICD-10-CM) - Chronic left shoulder pain  M25.511,G89.29 (ICD-10-CM) - Chronic right shoulder pain    THERAPY DIAG:  Bilateral shoulder pain  Abnormal posture   ONSET DATE: Around January had an injection that lasted a while but wore off.    SUBJECTIVE:  SUBJECTIVE STATEMENT:    Patient reports his pain has been much better. He feels like the needling with Stim has helped.   PERTINENT HISTORY:   PAIN:  Are you having pain? Yes: NPRS scale: 0/10 Pain location: right shoulder and left thoracic middle  Pain description: aching  Aggravating factors: reaching overhead and weed eating  Relieving factors: not using the shoulder   PRECAUTIONS: None  WEIGHT BEARING RESTRICTIONS No  FALLS:  Has patient fallen in last 6 months? 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   PLOF: Independent  PATIENT GOALS  To have less pain  OBJECTIVE:     UPPER EXTREMITY ROM:    Active ROM Right 07/03/2021 Left 07/03/2021 Right  5/9 Left 5/9  Shoulder flexion 140 135 150 156   Shoulder extension        Shoulder abduction        Shoulder adduction        Shoulder internal rotation To L4 with pain  To  L4 With pain  L2  without pain  L2 without pain   Shoulder external rotation painful Painful  Full no pain  Full no pain   Elbow flexion        Elbow extension        Wrist flexion        Wrist extension        Wrist ulnar deviation        Wrist radial deviation        Wrist pronation        Wrist supination        (Blank rows = not tested) Full passive ROM with pain at end range flexion and ER bilateral    UPPER EXTREMITY MMT:   MMT Right 07/03/2021 Left 07/03/2021 Right  08/04/2021 Left  5/92023    Shoulder flexion 4/5 4/5 4+/5 4+/5  Shoulder extension        Shoulder abduction        Shoulder adduction        Shoulder internal rotation 4/5 4/5 4+/5 4+/5  Shoulder external rotation 4/5 4/5 5/5 5/5  Middle trapezius        Lower trapezius        Elbow flexion        Elbow extension        Wrist flexion        Wrist extension        Wrist ulnar deviation        Wrist radial deviation        Wrist pronation        Wrist supination        Grip strength (lbs)        (Blank rows = not tested)   SHOULDER SPECIAL TESTS:            Impingement tests: Hawkins/Kennedy impingement test: positive  bilateral 5/9 still ha a positive Hawkins test              Rotator cuff assessment: Empty can test: positive  Bilateral             Biceps assessment: Speed's test: positive          PALPATION:  Mild tenderness to palpation in bilateral biceps groove ; tender to palpation down right biceps muscle belly  TODAY'S TREATMENT:  6/7  Manual:  Skilled palpation of trigger points. Patient still has some trigger points  but he is not having consistent pain TPDN held at this time.  Wand flexion 2x10  Supine march green band 2x10   Seated hip abduction 2x15 green band  Seated bilateral ER red 2x10   Standing row 2x10 green  Standing extension 2x10 green   Standing shoulder flexion 2lbs 2x10  Standing Y 2 x10 2lbs       5/17: Seated thoracic ext with ball 15x; Open books on wall 10x each  way; Cat cow on wall 10x Back against wall on 1/2 foam roll with bil UE elevation 10x     Trigger Point Dry-Needling  Treatment instructions: Expect mild to moderate muscle soreness. S/S of pneumothorax if dry needled over a lung field, and to seek immediate medical attention should they occur. Patient verbalized understanding of these instructions and education.  Patient Consent Given: Yes Education handout provided: Previously provided Muscles treated: upper trap and left t-6 t-9 paraspinals very close to midline for safety Electrical stimulation performed: yes Parameters: 2.5 ma 8 min  Treatment response/outcome: good twtich in both areas   Manual: trigger point release to upper traps and thoraicic spine; gross mobilization of the t-spine     5/9 Trigger Point Dry-Needling  Treatment instructions: Expect mild to moderate muscle soreness. S/S of pneumothorax if dry needled over a lung field, and to seek immediate medical attention should they occur. Patient verbalized understanding of these instructions and education.  Patient Consent Given: Yes Education handout provided: Previously provided Muscles treated: upper trap and left t-8 t-9 paraspinals  Electrical stimulation performed: No  Parameters: N/A Treatment response/outcome: good twtich in both areas   Manual: trigger point release to upper traps and thoraicic spine; gross mobilization of the t-spine   Extension 2x15 with green band  Row green band 2x15   Supine wand flexion 2x15   Seated:   March Green band 2x15  LAQ 2x20 each leg green band  Hip abduction 2x15 green band    PATIENT EDUCATION: Education details: reviewed HEP and symptom management; importance of exercises  Person educated: Patient Education method: Explanation, Demonstration, Tactile cues, and Verbal cues Education comprehension: verbalized understanding, returned demonstration, verbal cues required, tactile cues required, and needs further  education   HOME EXERCISE PROGRAM: Access Code: [email or web address] URL: [email or web address] Date: 07/15/2021 Prepared by: [place] Over Shoulder  - 1 x daily - 7 x weekly - 3 sets - 10 reps - Supine Shoulder Flexion Extension AAROM with Dowel  - 1 x daily - 7 x weekly - 3 sets - 10 reps - Shoulder extension with resistance - Neutral  - 1 x daily - 7 x weekly - 3 sets - 10 reps - Scapular Retraction with Resistance  - 1 x daily - 7 x weekly - 3 sets - 10 reps - Sidelying Shoulder External Rotation  - 1 x daily - 7 x weekly - 3 sets - 10 reps - Supine Shoulder Alphabet  - 1 x daily - 7 x weekly - 3 sets - 10 reps - Seated Thoracic Lumbar Extension with Pectoralis Stretch  - 1 x daily - 7 x weekly - 1 sets - 10 reps - Standing Quadratus Lumborum Stretch with Doorway  - 1 x daily - 7 x weekly - 1 sets - 5 reps   ASSESSMENT:  CLINICAL IMPRESSION:   Therapy continues to emphasize the importance of working on his exercises at home. The needling has helped reduce his pain but his exercises are what will  keep it from coming back. He is currently only walking at home. He is being more aware of his posture. Needling was held today. We focused more on reviewing his exercises at home.   OBJECTIVE IMPAIRMENTS decreased activity tolerance, decreased endurance, decreased ROM, decreased strength, postural dysfunction, and pain.   ACTIVITY LIMITATIONS community activity, yard work, and shopping.   PERSONAL FACTORS 1-2 comorbidities: multiple stomach surgeries; chronic low back pain  are also affecting patient's functional outcome.    REHAB POTENTIAL: Good  CLINICAL DECISION MAKING: Evolving/moderate complexity increasing shoulder pain with activity   EVALUATION COMPLEXITY: Moderate   GOALS: Goals reviewed with patient? Yes  SHORT TERM GOALS: Target date: 09/23/2021 assessed on 5/9   Patient will increase active flexion bilateral to 150 degrees without pain   Baseline: Goal status: achieved   2.  Patient will increase gross UE strength to 5/5  Baseline:  Goal status: improving ( ongoing )   3.  Patient will be independent and complaint with basic HEP  Baseline:  Goal status: INITIAL   LONG TERM GOALS: Target date: 10/14/2021  Patient will return to using the wee wacker without pain  Baseline:  Goal status: INITIAL  2.  Patient will reach overhead to a shelf without pain  Baseline:  Goal status: INITIAL  3.  Patient will show compliance to long term program for strengthening  Baseline:  Goal status: INITIAL   PLAN: PT FREQUENCY: 2x/week  PT DURATION: 6 weeks  PLANNED INTERVENTIONS: Therapeutic exercises, Therapeutic activity, Neuromuscular re-education, Patient/Family education, Joint mobilization, Aquatic Therapy, Dry Needling, Electrical stimulation, Cryotherapy, Moist heat, Taping, Ultrasound, and Manual therapy  PLAN FOR NEXT SESSION: manual posterior and inferior glides; DN as needed see how liked the added ES to DN; thoracic extension; continue with scapular exercises Burnis Medin PT DPT   Juliane [place] SPT  During this treatment session, the therapist was present, participating in and directing the treatment.    09/02/21 10:33 AM Phone: [phone] Fax: [phone]

## 2021-09-03 ENCOUNTER — Encounter (HOSPITAL_BASED_OUTPATIENT_CLINIC_OR_DEPARTMENT_OTHER): Payer: Self-pay | Admitting: Physical Therapy

## 2021-09-04 DIAGNOSIS — X500XXA Overexertion from strenuous movement or load, initial encounter: Secondary | ICD-10-CM | POA: Diagnosis not present

## 2021-09-04 DIAGNOSIS — S39011A Strain of muscle, fascia and tendon of abdomen, initial encounter: Secondary | ICD-10-CM | POA: Diagnosis not present

## 2021-09-09 ENCOUNTER — Ambulatory Visit (HOSPITAL_BASED_OUTPATIENT_CLINIC_OR_DEPARTMENT_OTHER): Payer: Medicare Other | Admitting: Physical Therapy

## 2021-09-09 ENCOUNTER — Encounter (HOSPITAL_BASED_OUTPATIENT_CLINIC_OR_DEPARTMENT_OTHER): Payer: Self-pay | Admitting: Physical Therapy

## 2021-09-09 DIAGNOSIS — R293 Abnormal posture: Secondary | ICD-10-CM

## 2021-09-09 DIAGNOSIS — G8929 Other chronic pain: Secondary | ICD-10-CM

## 2021-09-09 DIAGNOSIS — M5459 Other low back pain: Secondary | ICD-10-CM | POA: Diagnosis not present

## 2021-09-09 DIAGNOSIS — M25511 Pain in right shoulder: Secondary | ICD-10-CM | POA: Diagnosis not present

## 2021-09-09 DIAGNOSIS — M25512 Pain in left shoulder: Secondary | ICD-10-CM | POA: Diagnosis not present

## 2021-09-09 NOTE — Therapy (Addendum)
OUTPATIENT PHYSICAL THERAPY SHOULDER TREATMENT/Discharge    Patient Name: Miguel [name] MRN: [id number] DOB:08-18-1950, 71 y.o., male Today's Date: 09/09/2021   PT End of Session - 09/09/21 1224     Visit Number 9    Number of Visits 12    Date for PT Re-Evaluation 09/15/21    PT Start Time 7628    PT Stop Time 1226    PT Time Calculation (min) 41 min    Activity Tolerance Patient tolerated treatment well    Behavior During Therapy WFL for tasks assessed/performed              Past Medical History:  Diagnosis Date   Anemia    Anxiety    BPH (benign prostatic hyperplasia)    Gastritis    GERD (gastroesophageal reflux disease)    "seldom" (07/16/2016)   GI bleed due to NSAIDs 10/27/2015   History of blood transfusion 06/2016   post OR/notes 07/15/2016   History of hiatal hernia    History of kidney stones    surgery to remove stone   Hyperlipidemia    Hypertension    no meds    Melanoma of back ([place])    "mid back"   Sigmoid diverticulitis    with perforation   Past Surgical History:  Procedure Laterality Date   COLON SURGERY     sigmoid   COLOSTOMY TAKEDOWN N/A 06/28/2016   Procedure: COLOSTOMY TAKEDOWN;  Surgeon: [name], MD;  Location: [place];  Service: General;  Laterality: N/A;   CYSTOSCOPY WITH RETROGRADE PYELOGRAM, URETEROSCOPY AND STENT PLACEMENT Bilateral 02/12/2020   Procedure: CYSTOSCOPY WITH BILATERAL RETROGRADE PYELOGRAM,  AND LITHOPEXY;  Surgeon: Remi Haggard, MD;  Location: [facility];  Service: Urology;  Laterality: Bilateral;  1 HR   ESOPHAGOGASTRODUODENOSCOPY N/A 10/27/2015   Procedure: ESOPHAGOGASTRODUODENOSCOPY (EGD);  Surgeon: Clarene Essex, MD;  Location[facility] ENDOSCOPY;  Service: Endoscopy;  Laterality: N/A;   ESOPHAGOGASTRODUODENOSCOPY N/A 10/29/2015   Procedure: ESOPHAGOGASTRODUODENOSCOPY (EGD);  Surgeon: Ronald Lobo, MD;  Location: [facility] ENDOSCOPY;  Service: Endoscopy;  Laterality: N/A;   ESOPHAGOGASTRODUODENOSCOPY N/A 10/30/2015    Procedure: ESOPHAGOGASTRODUODENOSCOPY (EGD);  Surgeon: Clarene Essex, MD;  Location: [facility] ENDOSCOPY;  Service: Endoscopy;  Laterality: N/A;   g tube discontinued  04/2020   per patient   [place]  11/21/2015   REDUCTION OF HIATAL HERNIA , REPAIR HIATAL HERNIA, RESECTION SMALL BOWEL WITH ANASTOMOSIS, PLACEMENT GASTROSTOMY TUBE, PLACEMENT DUODENOSTOMY TUBE (N/A)   HEMORRHOID BANDING  X 2   HERNIA REPAIR     HIATAL HERNIA REPAIR N/A 11/21/2015   Procedure: REDUCTION OF HIATAL HERNIA , REPAIR HIATAL HERNIA, RESECTION SMALL BOWEL WITH ANASTOMOSIS, PLACEMENT GASTROSTOMY TUBE, PLACEMENT DUODENOSTOMY TUBE;  Surgeon: Mickeal Skinner, MD;  Location: [place];  Service: General;  Laterality: N/A;   HOLMIUM LASER APPLICATION Right 31/51/7616   Procedure: HOLMIUM LASER APPLICATION;  Surgeon: Remi Haggard, MD;  Location: [facility];  Service: Urology;  Laterality: Right;   INCISIONAL HERNIA REPAIR  06/28/2016   open/notes 07/15/2016   INCISIONAL HERNIA REPAIR  03/17/2018   WITH MESH   INCISIONAL HERNIA REPAIR N/A 03/17/2018   Procedure: INCISIONAL HERNIA REPAIR WITH MESH;  Surgeon: [name], MD;  Location: [place];  Service: General;  Laterality: N/A;   [place] N/A 10/16/2020   Procedure: Fatima Blank HERNIA REPAIR WITH MESH;  Surgeon: [name], MD;  Location: [place];  Service: General;  Laterality: N/A;   INGUINAL HERNIA REPAIR Bilateral 09/28/2018   INGUINAL HERNIA REPAIR  Bilateral 09/28/2018   Procedure: BILATERAL OPEN INGUINAL HERNIA REPAIR WITH MESH;  Surgeon: [name], MD;  Location: [place];  Service: General;  Laterality: Bilateral;  GENERAL AND TAP BLOCK   INSERTION OF MESH N/A 03/17/2018   Procedure: INSERTION OF MESH;  Surgeon: [name], MD;  Location: [place];  Service: General;  Laterality: N/A;   INSERTION OF MESH Bilateral 09/28/2018   Procedure: Insertion Of Mesh;  Surgeon: [name], MD;  Location: [place];  Service: General;   Laterality: Bilateral;   IR CM INJ ANY COLONIC TUBE W/FLUORO  02/04/2017   IR GUIDED DRAIN W CATHETER PLACEMENT  07/06/2016   /NOTES 07/15/2016   IR PATIENT EVAL TECH 0-60 MINS  06/28/2019   IR RADIOLOGIST EVAL & MGMT  07/27/2016   IR RADIOLOGIST EVAL & MGMT  08/17/2016   IR RADIOLOGIST EVAL & MGMT  08/26/2016   IR REPLACE G-TUBE SIMPLE WO FLUORO  07/28/2017   IR REPLACE G-TUBE SIMPLE WO FLUORO  01/31/2018   IR REPLACE G-TUBE SIMPLE WO FLUORO  10/16/2018   IR REPLACE G-TUBE SIMPLE WO FLUORO  03/05/2019   IR REPLACE G-TUBE SIMPLE WO FLUORO  08/22/2019   IR REPLACE G-TUBE SIMPLE WO FLUORO  01/08/2020   IR [place] GASTRO/COLONIC TUBE PERCUT W/FLUORO  08/04/2016   IR [place] GASTRO/COLONIC TUBE PERCUT W/FLUORO  01/14/2017   IR US GUIDE BX ASP/DRAIN  08/04/2016   KNEE CARTILAGE SURGERY Right 1971   "opened me up"   LAPAROTOMY N/A 07/05/2015   Procedure: PARTIAL SIGMOID COLECTOMY AND COLOSTOMY;  Surgeon: [name], MD;  Location: [place] [facility];  Service: General;  Laterality: N/A;   MELANOMA EXCISION  2001   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  10/30/2015   Procedure: REMOVAL OF DISTAL STOMACH;  Surgeon: Judeth Horn, MD;  Location: [place];  Service: General;;   REPAIR OF PERFORATED ULCER N/A 10/30/2015   Procedure: REPAIR OF BLEEDING  ULCER;  Surgeon: Judeth Horn, MD;  Location: [place] [facility];  Service: General;  Laterality: N/A;   TUMOR EXCISION  2009   "back; fatty tumor"   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   Bilateral inguinal hernia 09/28/2018   Incisional hernia 03/17/2018   Trigger thumb, left thumb 01/19/2018   Trigger finger, left index finger 07/18/2017   Trigger finger, right ring finger 01/31/2017   Trigger finger of left thumb 01/03/2017   Trigger finger of right thumb 01/03/2017   Trigger index finger of left hand 01/03/2017   Trigger index finger of right hand 01/03/2017   Malnutrition  of moderate degree 07/19/2016   Intra-abdominal abscess ([place]) 07/16/2016   Cellulitis 07/15/2016   S/P colostomy takedown 06/28/2016   Pain in thoracic spine 06/09/2016   Mid back pain 06/09/2016   Postoperative fever 11/19/2015   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   Severe protein-calorie malnutrition ([place]) 11/17/2015   Sepsis ([place]) 11/14/2015   Hiccups 11/14/2015   AKI (acute kidney injury) ([place]) 11/14/2015   Fever    Leg swelling    Left shoulder pain    Muscle spasm of left shoulder    GI bleed 10/27/2015   Acute blood loss anemia 10/27/2015   Syncope 10/27/2015   Hyperglycemia 10/27/2015   Hypotension 10/27/2015   Neck pain 10/27/2015   Hematemesis 10/27/2015   Hematochezia 10/27/2015   Diverticulitis of colon with perforation 07/05/2015    PCP: Alroy Dust, L.Marlou Sa, MD  REFERRING PROVIDER: Dr Jean Rosenthal   REFERRING DIAG:  [phone] (ICD-10-CM) - Chronic left shoulder pain  M25.511,G89.29 (ICD-10-CM) - Chronic right shoulder pain    THERAPY DIAG:  Bilateral shoulder pain  Abnormal posture   ONSET DATE: Around January had an injection that lasted a while but wore off.    SUBJECTIVE:                                                                                                                                                                                      SUBJECTIVE STATEMENT: Patient went to the MD. He reports the MD had him do some tighening of his stomach. He began to have pain after in his stomach that has been radiating into his back.   PERTINENT HISTORY:   PAIN:  Are you having pain? Yes: NPRS scale: 7/10 Pain location: right shoulder and left thoracic middle; has reach near a 10/10  Pain description: aching  Aggravating factors: reaching overhead and weed eating  Relieving factors: not using the shoulder   PRECAUTIONS: None  WEIGHT BEARING RESTRICTIONS No  FALLS:  Has patient fallen in last 6 months? 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   PLOF: Independent  PATIENT GOALS  To have less pain  OBJECTIVE:     UPPER EXTREMITY ROM:    Active ROM Right 07/03/2021 Left 07/03/2021 Right  5/9 Left 5/9  Shoulder flexion 140 135 150 156   Shoulder extension        Shoulder abduction        Shoulder adduction        Shoulder internal rotation To L4 with pain  To  L4 With pain  L2 without pain  L2 without pain   Shoulder external rotation painful Painful  Full no pain  Full no pain   Elbow flexion        Elbow extension        Wrist flexion        Wrist extension        Wrist ulnar deviation        Wrist radial deviation        Wrist pronation        Wrist supination        (Blank rows = not tested) Full passive ROM with pain at end range flexion and ER bilateral    UPPER EXTREMITY MMT:   MMT Right 07/03/2021 Left 07/03/2021 Right  08/04/2021 Left  5/92023    Shoulder flexion 4/5 4/5 4+/5 4+/5  Shoulder extension        Shoulder abduction  Shoulder adduction        Shoulder internal rotation 4/5 4/5 4+/5 4+/5  Shoulder external rotation 4/5 4/5 5/5 5/5  Middle trapezius        Lower trapezius        Elbow flexion        Elbow extension        Wrist flexion        Wrist extension        Wrist ulnar deviation        Wrist radial deviation        Wrist pronation        Wrist supination        Grip strength (lbs)        (Blank rows = not tested)   SHOULDER SPECIAL TESTS:            Impingement tests: Hawkins/Kennedy impingement test: positive  bilateral 5/9 still ha a positive Hawkins test              Rotator cuff assessment: Empty can test: positive  Bilateral             Biceps assessment: Speed's test: positive          PALPATION:  Mild tenderness to palpation in bilateral biceps groove ; tender to palpation down right biceps muscle belly   TODAY'S TREATMENT:   6/14  Trigger Point  Dry-Needling  Treatment instructions: Expect mild to moderate muscle soreness. S/S of pneumothorax if dry needled over a lung field, and to seek immediate medical attention should they occur. Patient verbalized understanding of these instructions and education.  Patient Consent Given: Yes Education handout provided: Previously provided Muscles treated: left gluteal 3 spots using a .30x50 needle  Electrical stimulation performed: no Parameters: Treatment response/outcome: good twtich  LTR x20  Piriformis stretch 3x20 sec hold     6/7  Manual:  Skilled palpation of trigger points. Patient still has some trigger points but he is not having consistent pain TPDN held at this time.  Wand flexion 2x10  Supine march green band 2x10   Seated hip abduction 2x15 green band  Seated bilateral ER red 2x10   Standing row 2x10 green  Standing extension 2x10 green   Standing shoulder flexion 2lbs 2x10  Standing Y 2 x10 2lbs       5/17: Seated thoracic ext with ball 15x; Open books on wall 10x each way; Cat cow on wall 10x Back against wall on 1/2 foam roll with bil UE elevation 10x     Trigger Point Dry-Needling  Treatment instructions: Expect mild to moderate muscle soreness. S/S of pneumothorax if dry needled over a lung field, and to seek immediate medical attention should they occur. Patient verbalized understanding of these instructions and education.  Patient Consent Given: Yes Education handout provided: Previously provided Muscles treated: upper trap and left t-6 t-9 paraspinals very close to midline for safety Electrical stimulation performed: yes Parameters: 2.5 ma 8 min  Treatment response/outcome: good twtich in both areas   Manual: trigger point release to upper traps and thoraicic spine; gross mobilization of the t-spine   Trigger point release to thoracic and lumbar paraspinals and gluteal   5/9 Trigger Point Dry-Needling  Treatment instructions: Expect mild  to moderate muscle soreness. S/S of pneumothorax if dry needled over a lung field, and to seek immediate medical attention should they occur. Patient verbalized understanding of these instructions and education.  Patient Consent Given: Yes Education handout provided: Previously provided  Muscles treated: upper trap and left t-8 t-9 paraspinals  Electrical stimulation performed: No  Parameters: N/A Treatment response/outcome: good twtich in both areas   Manual: trigger point release to upper traps and thoraicic spine; gross mobilization of the t-spine   Extension 2x15 with green band  Row green band 2x15   Supine wand flexion 2x15   Seated:   March Green band 2x15  LAQ 2x20 each leg green band  Hip abduction 2x15 green band    PATIENT EDUCATION: Education details: reviewed HEP and symptom management; importance of exercises  Person educated: Patient Education method: Explanation, Demonstration, Tactile cues, and Verbal cues Education comprehension: verbalized understanding, returned demonstration, verbal cues required, tactile cues required, and needs further education   HOME EXERCISE PROGRAM: Access Code: [email or web address] URL: [email or web address] Date: 07/15/2021 Prepared by: [place] Over Shoulder  - 1 x daily - 7 x weekly - 3 sets - 10 reps - Supine Shoulder Flexion Extension AAROM with Dowel  - 1 x daily - 7 x weekly - 3 sets - 10 reps - Shoulder extension with resistance - Neutral  - 1 x daily - 7 x weekly - 3 sets - 10 reps - Scapular Retraction with Resistance  - 1 x daily - 7 x weekly - 3 sets - 10 reps - Sidelying Shoulder External Rotation  - 1 x daily - 7 x weekly - 3 sets - 10 reps - Supine Shoulder Alphabet  - 1 x daily - 7 x weekly - 3 sets - 10 reps - Seated Thoracic Lumbar Extension with Pectoralis Stretch  - 1 x daily - 7 x weekly - 1 sets - 10 reps - Standing Quadratus Lumborum Stretch with Doorway  - 1 x daily - 7 x  weekly - 1 sets - 5 reps   ASSESSMENT:  CLINICAL IMPRESSION:   The patient had significant spasming in his lower back and mid theriac area. He also had a large trigger point in his gluteal. He responded well to manual and dry needling. He was advised if his stomach pain continues, to talk to his MD. Therapy will advance as tolerated. He will schedule 1 more visit then we will re-assess.   OBJECTIVE IMPAIRMENTS decreased activity tolerance, decreased endurance, decreased ROM, decreased strength, postural dysfunction, and pain.   ACTIVITY LIMITATIONS community activity, yard work, and shopping.   PERSONAL FACTORS 1-2 comorbidities: multiple stomach surgeries; chronic low back pain  are also affecting patient's functional outcome.    REHAB POTENTIAL: Good  CLINICAL DECISION MAKING: Evolving/moderate complexity increasing shoulder pain with activity   EVALUATION COMPLEXITY: Moderate   GOALS: Goals reviewed with patient? Yes  SHORT TERM GOALS: Target date: 09/30/2021 assessed on 5/9   Patient will increase active flexion bilateral to 150 degrees without pain  Baseline: Goal status: achieved   2.  Patient will increase gross UE strength to 5/5  Baseline:  Goal status: improving ( ongoing )   3.  Patient will be independent and complaint with basic HEP  Baseline:  Goal status: INITIAL   LONG TERM GOALS: Target date: 10/21/2021  Patient will return to using the wee wacker without pain  Baseline:  Goal status: INITIAL  2.  Patient will reach overhead to a shelf without pain  Baseline:  Goal status: INITIAL  3.  Patient will show compliance to long term program for strengthening  Baseline:  Goal status: INITIAL   PLAN: PT FREQUENCY: 2x/week  PT DURATION: 6 weeks  PLANNED INTERVENTIONS:  Therapeutic exercises, Therapeutic activity, Neuromuscular re-education, Patient/Family education, Joint mobilization, Aquatic Therapy, Dry Needling, Electrical stimulation, Cryotherapy,  Moist heat, Taping, Ultrasound, and Manual therapy  PLAN FOR NEXT SESSION: manual posterior and inferior glides; DN as needed see how liked the added ES to DN; thoracic extension; continue with scapular exercises Burnis Medin PT DPT   Juliane [place] SPT  During this treatment session, the therapist was present, participating in and directing the treatment.   PHYSICAL THERAPY DISCHARGE SUMMARY  Visits from Start of Care: 9  Current functional level related to goals / functional outcomes: Improved pain and posture. Has a full exercise program    Remaining deficits: Continued pain. Not doing exercises on a regular basis   Education / Equipment: HEP  Patient agrees to discharge. Patient goals were met. Patient is being discharged due to meeting the stated rehab goals.   09/09/21 3:39 PM Phone: [phone] Fax: [phone]

## 2021-10-12 ENCOUNTER — Ambulatory Visit
Admission: RE | Admit: 2021-10-12 | Discharge: 2021-10-12 | Disposition: A | Discharge status: Home / Self Care | Payer: Medicare Other | Source: Ambulatory Visit | Attending: Family Medicine | Admitting: Family Medicine

## 2021-10-12 ENCOUNTER — Other Ambulatory Visit: Payer: Self-pay | Admitting: Family Medicine

## 2021-10-12 DIAGNOSIS — G8912 Acute post-thoracotomy pain: Secondary | ICD-10-CM | POA: Diagnosis not present

## 2021-10-12 DIAGNOSIS — R635 Abnormal weight gain: Secondary | ICD-10-CM | POA: Diagnosis not present

## 2021-10-12 DIAGNOSIS — R609 Edema, unspecified: Secondary | ICD-10-CM | POA: Diagnosis not present

## 2021-10-12 DIAGNOSIS — R0781 Pleurodynia: Secondary | ICD-10-CM | POA: Diagnosis not present

## 2021-10-12 DIAGNOSIS — R600 Localized edema: Secondary | ICD-10-CM | POA: Diagnosis not present

## 2021-10-23 ENCOUNTER — Encounter: Payer: Self-pay | Admitting: Pulmonary Disease

## 2021-10-23 ENCOUNTER — Ambulatory Visit (INDEPENDENT_AMBULATORY_CARE_PROVIDER_SITE_OTHER): Payer: Medicare Other | Admitting: Pulmonary Disease

## 2021-10-23 VITALS — BP 130/80 | HR 85 | Ht 66.0 in | Wt 129.4 lb

## 2021-10-23 DIAGNOSIS — R600 Localized edema: Secondary | ICD-10-CM

## 2021-10-23 NOTE — Progress Notes (Signed)
$'@Patient'd$  ID: Miguel [name], male    DOB: 03-May-1950, 71 y.o.   MRN: [id number]  Chief Complaint  Patient presents with   Consult    Consult: Pain on left side of ribs, swelling in ankles and feet, possible COPD.    Referring provider: Alroy Dust, L.Marlou Sa, MD  HPI:   71 y.o. man whom are seen in consultation for evaluation of hyperinflation on chest x-ray.  Note from referring provider reviewed.  Patient presents today for evaluation of hyperinflation.  Reviewed chest x-ray in detail with patient 09/2021.  This shows flattened diaphragms with baseline hyperinflation as well as increased AP diameter and hyperlucency between heart and sternum consistent with hyperinflation.  He is never smoker.  No dyspnea.  Never diagnosed with asthma.  No representative stridor.  No cough etc.  Overall he feels well.  Chief complaint today is intermittent leg swelling and some memory issues.  He states having a hard time recalling things and later it comes to him.  Associated with lower extremity swelling that comes and goes.  Compression stockings have helped.  Again no dyspnea or anything like that.  No exertional limitation.  Walks 1 to 2 miles daily with his dog.  Without issue.  Has had thoracentesis in the past with fluid overload associated with hospitalization with abdominal catastrophe.  Reviewed his chest x-ray that on my review interpretation shows minimal to no fluid or pleural effusion.  Radiology read demonstrates no concern for pleural effusion.  He is never had echocardiogram that I can see in our EMR.  PMH: GERD, back pain Surgical history: Multiple abdominal surgeries following perforated colon Family history: Mother with CVA, brother with CVA, CAD Social history: Never smoker, lives in [place] / Pulmonary Flowsheets:   ACT:      No data to display          MMRC:     No data to display          Epworth:      No data to display          Tests:    FENO:  No results found for: "NITRICOXIDE"  PFT:     No data to display          WALK:      No data to display          Imaging: Personally reviewed and as per EMR discussion this note DG Chest 2 View  Result Date: 10/13/2021 CLINICAL DATA:  Edema, left lower anterior rib pain. Bilateral ankle swelling for 2-3 weeks. EXAM: CHEST - 2 VIEW COMPARISON:  01/02/2018. FINDINGS: The heart size and mediastinal contours are within normal limits. Atherosclerotic calcification of the aorta is noted. No consolidation, effusion, or pneumothorax. There is hyperinflation of the lungs with increased AP diameter of the chest suggesting chronic obstructive pulmonary disease. Degenerative changes are noted in the thoracic spine. IMPRESSION: 1. No active cardiopulmonary disease. 2. Chronic obstructive pulmonary disease. Electronically Signed   By: Brett Fairy M.D.   On: 10/13/2021 04:02    Lab Results: Personally reviewed CBC    Component Value Date/Time   WBC 4.7 10/20/2020 0100   RBC 2.47 (L) 10/20/2020 0100   HGB 7.8 (L) 10/20/2020 0100   HCT 23.3 (L) 10/20/2020 0100   PLT 139 (L) 10/20/2020 0100   MCV 94.3 10/20/2020 0100   MCH 31.6 10/20/2020 0100   MCHC 33.5 10/20/2020 0100   RDW 13.8 10/20/2020 0100   LYMPHSABS 0.9 08/03/2016  1221   MONOABS 1.0 08/03/2016 1221   EOSABS 0.1 08/03/2016 1221   BASOSABS 0.0 08/03/2016 1221    BMET    Component Value Date/Time   NA 132 (L) 10/20/2020 0100   K 3.8 10/20/2020 0100   CL 98 10/20/2020 0100   CO2 27 10/20/2020 0100   GLUCOSE 99 10/20/2020 0100   BUN 11 10/20/2020 0100   CREATININE 0.74 10/20/2020 0100   CALCIUM 8.1 (L) 10/20/2020 0100   GFRNONAA >60 10/20/2020 0100   GFRAA >60 09/25/2018 1130    BNP No results found for: "BNP"  ProBNP No results found for: "PROBNP"  Specialty Problems       Pulmonary Problems   Hiccups    No Known Allergies  Immunization History  Administered Date(s) Administered    Influenza Split 01/27/2012, 02/12/2013, 02/10/2016, 01/27/2017, 01/16/2020   Influenza,inj,quad, With Preservative 03/04/2014   PFIZER(Purple Top)SARS-COV-2 Vaccination 04/23/2019, 05/14/2019, 01/16/2020   Pfizer Covid-19 Vaccine Bivalent Booster 72yr & up 02/18/2021   Pneumococcal Conjugate-13 03/07/2015   Pneumococcal Polysaccharide-23 03/08/2016   Td 01/29/2001   Tdap 05/25/2011, 08/11/2021   Zoster, Live 11/24/2011    Past Medical History:  Diagnosis Date   Anemia    Anxiety    BPH (benign prostatic hyperplasia)    Gastritis    GERD (gastroesophageal reflux disease)    "seldom" (07/16/2016)   GI bleed due to NSAIDs 10/27/2015   History of blood transfusion 06/2016   post OR/notes 07/15/2016   History of hiatal hernia    History of kidney stones    surgery to remove stone   Hyperlipidemia    Hypertension    no meds    [place]    "[place] back"   Sigmoid diverticulitis    with perforation    Tobacco History: Social History   Tobacco Use  Smoking Status Never  Smokeless Tobacco Former   Types: Snuff  Tobacco Comments   occasional snuff As of 10/23/21. Tay   Counseling given: Not Answered Tobacco comments: occasional snuff As of 10/23/21. Tay   Continue to not smoke  Outpatient Encounter Medications as of 10/23/2021  Medication Sig   acetaminophen (TYLENOL) 500 MG tablet Take 1 tablet (500 mg total) by mouth every 8 (eight) hours as needed for mild pain (for pain). (Patient taking differently: Take 250-500 mg by mouth every 4 (four) hours as needed for mild pain or fever.)   diazepam (VALIUM) 5 MG tablet Take one tablet by mouth with food one hour prior to procedure. May repeat 30 minutes prior if needed.   diclofenac sodium (VOLTAREN) 1 % GEL Apply 2 g topically 3 (three) times daily as needed (joint pain).    famotidine (PEPCID) 20 MG tablet Take 20 mg by mouth 2 (two) times daily.    HYDROcodone-acetaminophen (NORCO/VICODIN) 5-325 MG tablet Take 1  tablet by mouth 2 (two) times daily as needed for moderate pain.   LORazepam (ATIVAN) 1 MG tablet Take 0.5 mg by mouth daily as needed for anxiety.   methocarbamol (ROBAXIN) 500 MG tablet Take 250-500 mg by mouth every 8 (eight) hours as needed for muscle spasms.    methylPREDNISolone (MEDROL) 4 MG tablet Medrol dose pack. Take as instructed (Patient not taking: Reported on 10/23/2021)   Multiple Vitamin (MULTIVITAMIN WITH MINERALS) TABS tablet Take 1 tablet by mouth daily.   ondansetron (ZOFRAN-ODT) 4 MG disintegrating tablet Take 4 mg by mouth every 6 (six) hours as needed for nausea/vomiting.   oxyCODONE (OXY IR/ROXICODONE) 5  MG immediate release tablet Take 1 tablet (5 mg total) by mouth every 6 (six) hours as needed for moderate pain, severe pain or breakthrough pain.   pantoprazole (PROTONIX) 40 MG tablet Take 40 mg by mouth 2 (two) times daily.   polyethylene glycol (MIRALAX / GLYCOLAX) 17 g packet Take 17 g by mouth daily as needed for mild constipation.   Probiotic Product (PROBIOTIC DAILY PO) Take 1 capsule by mouth daily.   tamsulosin (FLOMAX) 0.4 MG CAPS capsule Take 1 capsule (0.4 mg total) by mouth daily.   tiZANidine (ZANAFLEX) 2 MG tablet Take 1-2 mg by mouth 2 (two) times daily as needed for muscle spasms.   traMADol (ULTRAM) 50 MG tablet Take 50 mg by mouth every 6 (six) hours as needed for moderate pain.   vitamin B-12 (CYANOCOBALAMIN) 1000 MCG tablet Take 1,000 mcg by mouth daily.    zolpidem (AMBIEN) 10 MG tablet Take 10 mg by mouth at bedtime.   Facility-Administered Encounter Medications as of 10/23/2021  Medication   morphine 4 MG/ML injection 1-4 mg     Review of Systems  Review of Systems  No chest pain with exertion.  No orthopnea or PND.  Comprehensive review of systems otherwise negative. Physical Exam  BP 130/80 (BP Location: Left Arm)   Pulse 85   Ht '5\' 6"'$  (1.676 m)   Wt 129 lb 6.4 oz (58.7 kg)   SpO2 98%   BMI 20.89 kg/m   Wt Readings from Last 5  Encounters:  10/23/21 129 lb 6.4 oz (58.7 kg)  05/27/21 120 lb (54.4 kg)  10/16/20 125 lb 10.6 oz (57 kg)  10/08/20 125 lb 9.6 oz (57 kg)  07/24/20 123 lb (55.8 kg)    BMI Readings from Last 5 Encounters:  10/23/21 20.89 kg/m  05/27/21 21.26 kg/m  10/16/20 22.26 kg/m  10/08/20 22.25 kg/m  07/24/20 21.11 kg/m     Physical Exam General: Well-appearing, no acute distress Eyes: EOMI, no icterus Neck: Supple, No JVP Pulmonary: Clear, normal work of breathing Cardiovascular: No lower extremity swelling, warm Abdomen: Nondistended, bowel sounds present MSK: No synovitis, no joint effusion Neuro: Normal gait, no weakness Psych: Normal mood, full affect   Assessment & Plan:   Hyperinflation on chest x-ray: Flattened diaphragms and increased AP diameter with lucency between the sternum and heart consistent with hyperinflation.  He has no symptoms.  Never smoker.  Poss related to quiescent or clinically insignificant asthma throughout the years.  No need for medications at this time given lack of dyspnea or other respiratory symptoms.  Intermittent leg swelling: Worse over the last few weeks.  Echocardiogram for further evaluation.   Return if symptoms worsen or fail to improve.   Lanier Clam, MD 10/23/2021

## 2021-10-23 NOTE — Patient Instructions (Signed)
It is nice to meet you  Your chest x-ray to me looks pretty clear.  There are subtle changes of a little extra air that may be stuck in the lungs when you breathe out.  But does not cause you any problems.  This is not dangerous.  Since you have no shortness of breath and you are exercising, walking regularly, I have no recommendation to change or add medications.  Given the intermittent swelling, I recommend we get a heart ultrasound or echocardiogram.  I ordered this.  If there is anything we need to follow-up on this I will help arrange this.  Return to clinic as needed

## 2021-11-03 ENCOUNTER — Institutional Professional Consult (permissible substitution): Payer: Medicare Other | Admitting: Pulmonary Disease

## 2021-11-04 ENCOUNTER — Telehealth: Payer: Self-pay | Admitting: Physical Medicine and Rehabilitation

## 2021-11-04 ENCOUNTER — Ambulatory Visit (HOSPITAL_COMMUNITY): Payer: Medicare Other | Attending: Pulmonary Disease

## 2021-11-04 DIAGNOSIS — R600 Localized edema: Secondary | ICD-10-CM | POA: Diagnosis not present

## 2021-11-04 LAB — ECHOCARDIOGRAM COMPLETE
Area-P 1/2: 4.57 cm2
S' Lateral: 2.6 cm

## 2021-11-04 NOTE — Telephone Encounter (Signed)
Pt called and states that he is still having the pain in his lower back about his buttocks. Wondering if he can try the injection now?   Cb [phone]

## 2021-11-05 ENCOUNTER — Emergency Department (HOSPITAL_COMMUNITY): Payer: Medicare Other

## 2021-11-05 ENCOUNTER — Ambulatory Visit (INDEPENDENT_AMBULATORY_CARE_PROVIDER_SITE_OTHER): Payer: Medicare Other | Admitting: Physical Medicine and Rehabilitation

## 2021-11-05 ENCOUNTER — Encounter: Payer: Self-pay | Admitting: Physical Medicine and Rehabilitation

## 2021-11-05 ENCOUNTER — Encounter (HOSPITAL_COMMUNITY): Payer: Self-pay

## 2021-11-05 ENCOUNTER — Other Ambulatory Visit: Payer: Self-pay

## 2021-11-05 ENCOUNTER — Emergency Department (HOSPITAL_COMMUNITY)
Admission: EM | Admit: 2021-11-05 | Discharge: 2021-11-05 | Disposition: A | Discharge status: Home / Self Care | Payer: Medicare Other | Attending: Emergency Medicine | Admitting: Emergency Medicine

## 2021-11-05 VITALS — BP 207/85 | HR 68

## 2021-11-05 DIAGNOSIS — G8929 Other chronic pain: Secondary | ICD-10-CM | POA: Diagnosis not present

## 2021-11-05 DIAGNOSIS — Z79899 Other long term (current) drug therapy: Secondary | ICD-10-CM | POA: Diagnosis not present

## 2021-11-05 DIAGNOSIS — R1032 Left lower quadrant pain: Secondary | ICD-10-CM | POA: Insufficient documentation

## 2021-11-05 DIAGNOSIS — I10 Essential (primary) hypertension: Secondary | ICD-10-CM | POA: Diagnosis not present

## 2021-11-05 DIAGNOSIS — M546 Pain in thoracic spine: Secondary | ICD-10-CM | POA: Diagnosis not present

## 2021-11-05 DIAGNOSIS — M545 Low back pain, unspecified: Secondary | ICD-10-CM | POA: Diagnosis not present

## 2021-11-05 DIAGNOSIS — M7918 Myalgia, other site: Secondary | ICD-10-CM | POA: Diagnosis not present

## 2021-11-05 DIAGNOSIS — K7689 Other specified diseases of liver: Secondary | ICD-10-CM | POA: Diagnosis not present

## 2021-11-05 DIAGNOSIS — G894 Chronic pain syndrome: Secondary | ICD-10-CM

## 2021-11-05 DIAGNOSIS — R1012 Left upper quadrant pain: Secondary | ICD-10-CM | POA: Insufficient documentation

## 2021-11-05 LAB — COMPREHENSIVE METABOLIC PANEL
ALT: 12 U/L (ref 0–44)
AST: 17 U/L (ref 15–41)
Albumin: 4.1 g/dL (ref 3.5–5.0)
Alkaline Phosphatase: 60 U/L (ref 38–126)
Anion gap: 8 (ref 5–15)
BUN: 15 mg/dL (ref 8–23)
CO2: 28 mmol/L (ref 22–32)
Calcium: 9.1 mg/dL (ref 8.9–10.3)
Chloride: 103 mmol/L (ref 98–111)
Creatinine, Ser: 0.88 mg/dL (ref 0.61–1.24)
GFR, Estimated: >60 mL/min (ref >60)
Glucose, Bld: 92 mg/dL (ref 70–99)
Potassium: 3.7 mmol/L (ref 3.5–5.1)
Sodium: 139 mmol/L (ref 135–145)
Total Bilirubin: 0.3 mg/dL (ref 0.3–1.2)
Total Protein: 6.7 g/dL (ref 6.5–8.1)

## 2021-11-05 LAB — CBC WITH DIFFERENTIAL/PLATELET
Abs Immature Granulocytes: 0.01 10*3/uL (ref 0.00–0.07)
Basophils Absolute: 0 10*3/uL (ref 0.0–0.1)
Basophils Relative: 0 %
Eosinophils Absolute: 0.1 10*3/uL (ref 0.0–0.5)
Eosinophils Relative: 2 %
HCT: 39.3 % (ref 39.0–52.0)
Hemoglobin: 13.3 g/dL (ref 13.0–17.0)
Immature Granulocytes: 0 %
Lymphocytes Relative: 31 %
Lymphs Abs: 1.8 10*3/uL (ref 0.7–4.0)
MCH: 32.7 pg (ref 26.0–34.0)
MCHC: 33.8 g/dL (ref 30.0–36.0)
MCV: 96.6 fL (ref 80.0–100.0)
Monocytes Absolute: 0.6 10*3/uL (ref 0.1–1.0)
Monocytes Relative: 10 %
Neutro Abs: 3.3 10*3/uL (ref 1.7–7.7)
Neutrophils Relative %: 57 %
Platelets: 258 10*3/uL (ref 150–400)
RBC: 4.07 MIL/uL — ABNORMAL LOW (ref 4.22–5.81)
RDW: 12 % (ref 11.5–15.5)
WBC: 5.8 10*3/uL (ref 4.0–10.5)
nRBC: 0 % (ref 0.0–0.2)

## 2021-11-05 LAB — URINALYSIS, ROUTINE W REFLEX MICROSCOPIC
Bilirubin Urine: NEGATIVE
Glucose, UA: NEGATIVE mg/dL
Hgb urine dipstick: NEGATIVE
Ketones, ur: NEGATIVE mg/dL
Leukocytes,Ua: NEGATIVE
Nitrite: NEGATIVE
Protein, ur: NEGATIVE mg/dL
Specific Gravity, Urine: 1.003 — ABNORMAL LOW (ref 1.005–1.030)
pH: 7 (ref 5.0–8.0)

## 2021-11-05 LAB — LIPASE, BLOOD: Lipase: 42 U/L (ref 11–51)

## 2021-11-05 MED ORDER — IOHEXOL 300 MG/ML  SOLN
100.0000 mL | Freq: Once | INTRAMUSCULAR | Status: AC | PRN
  Administered 2021-11-05: 100 mL via INTRAVENOUS

## 2021-11-05 NOTE — Progress Notes (Signed)
Echocardiogram demonstrates mild stiffness of the heart, otherwise reassuring and normal.

## 2021-11-05 NOTE — ED Triage Notes (Signed)
Pt complains of left sided abdominal pain x 4 days. Constant, left flank to LLQ, no alleviating/aggravating factors and some urinary frequency. Denies fever, chills, emesis, melena, hematochezia, or hematuria.

## 2021-11-05 NOTE — Progress Notes (Signed)
Miguel [name] - 71 y.o. male MRN [id number]  Date of birth: Jul 16, 1950  Office Visit Note: Visit Date: 11/05/2021 PCP: Alroy Dust, L.Marlou Sa, MD Referred by: Alroy Dust, L.Marlou Sa, MD  Subjective: Chief Complaint  Patient presents with   Middle Back - Pain   HPI: Miguel GERDING is a 71 y.o. male who comes in today for evaluation of chronic, worsening and severe left sided upper and lower back pain. Pain ongoing for several years and is exacerbated by movement and activity, describes pain as a sore, aching and tight sensation, currently rates as 5 out of 10. Patient reports some relief of pain with home exercise regimen, rest and medications. His chronic pain is currently being managed with Oxycodone/Tramadol prescribed by his primary care provider Dr. Donnie Coffin. Patient has attended formal physical therapy at [facility] and reports some relief of pain with these treatments. He reports significant relief with previous dry needling treatments. Thoracic MRI imaging from February exhibits minimal degenerative changes, no nerve impingement, no stenosis. Lumbar MRI imaging from 2022 exhibits multilevel facet arthropathy and broad-based disc protrusion contributing to mild foraminal narrowing bilaterally, left greater than right. No high grade spinal canal stenosis. Patient has undergone both trigger point injections and lumbar epidural steroid injection that did provide some short term relief of pain. Patient states his wife does massage the "knots" in his back daily and also applies Voltaren gel as needed. States his pain is manageable most days. Patient does voice recent abdominal issues, he did visit the emergency department today but was unable to stay for full evaluation. Patient denies focal weakness, numbness and tingling. Patient denies recent trauma or falls.      Review of Systems  Musculoskeletal:  Positive for back pain and myalgias.  Neurological:  Negative  for tingling, sensory change, focal weakness and weakness.  All other systems reviewed and are negative.  Otherwise per HPI.  Assessment & Plan: Visit Diagnoses:    ICD-10-CM   1. Chronic left-sided low back pain without sciatica  M54.50 Ambulatory referral to Physical Therapy   G89.29     2. Pain in thoracic spine  M54.6 Ambulatory referral to Physical Therapy    3. Myofascial pain syndrome  M79.18 Ambulatory referral to Physical Therapy    4. Chronic pain syndrome  G89.4 Ambulatory referral to Physical Therapy       Plan: Findings:  Chronic, worsening and severe left sided upper and lower back pain. Patient continues to have severe pain despite good conservative therapies such as home exercise regimen, rest and use of medications. Patients clinical presentation and exam are consistent with myofascial pain syndrome as his pain is more non dermatomal and has multiple palpable trigger points noted to left paraspinal regions upon exam. Next step is to place referral for formal physical therapy to address myofascial pain, would consider manual treatments and dry needling. I discussed medication management with patient today. He can continue with chronic pain management with primary care provider if he feels this is necessary. I also discussed massage therapy, continued use of topical pain creams and Lidocaine patches. Patient instructed to follow up with [place] as needed. No red flag symptoms noted.      Meds & Orders: No orders of the defined types were placed in this encounter.   Orders Placed This Encounter  Procedures   Ambulatory referral to Physical Therapy    Follow-up: Return if symptoms worsen or fail to improve.   Procedures: No procedures performed  Clinical History: MRI LUMBAR SPINE WITHOUT CONTRAST     TECHNIQUE:  Multiplanar, multisequence MR imaging of the lumbar spine was  performed. No intravenous contrast was administered.     COMPARISON:  Lumbar spine  radiographs 03/11/2020     FINDINGS:  Segmentation: 5 non rib-bearing lumbar type vertebral bodies are  present. The lowest fully formed vertebral body is L5.     Alignment: Slight retrolisthesis is present at T12-L1, L1-2 and  L2-3. Lumbar lordosis is preserved.     Vertebrae:  Marrow signal and vertebral body heights are normal.     Conus medullaris and cauda equina: Conus extends to the L1-2 level.  Conus and cauda equina appear normal.     Paraspinal and other soft tissues: Layering gallstones noted. No  inflammatory change to suggest cholecystitis. 11 mm simple cyst  noted posteriorly in the right kidney. No other solid organ lesions  are present. No significant adenopathy is present.     Disc levels:     T12-L1: Mild disc bulging without significant stenosis.     L1-2: Retrolisthesis and uncovering of the disc contributes to mild  foraminal narrowing bilaterally.     L2-3: Retrolisthesis and uncovering of a broad-based disc protrusion  contribute to mild foraminal narrowing bilaterally, left greater  than right.     L3-4: Broad-based disc protrusion extends into the foramina.  Moderate facet hypertrophy is noted bilaterally. Mild central and  bilateral foraminal narrowing is present.     L4-5: A broad-based disc protrusion is present. Moderate facet  hypertrophy is noted. Mild central and mild to moderate foraminal  narrowing is worse right than left.     L5-S1: Facet and endplate spurring contribute to moderate left and  mild right foraminal narrowing.     IMPRESSION:  1. Multilevel spondylosis of the lumbar spine as described.  2. Mild foraminal narrowing bilaterally at L1-2 and L2-3 is worse on  the left. Slight retrolisthesis and uncovering of a broad-based disc  protrusion contribute at both levels.  3. Mild central and bilateral foraminal narrowing at L3-4 secondary  to a broad-based disc protrusion and facet hypertrophy.  4. Mild central and mild to  moderate foraminal narrowing bilaterally  at L4-5 is worse right than left.  5. Moderate left and mild right foraminal stenosis at L5-S1 due to  endplate and facet spurring.  6. Cholelithiasis without evidence for cholecystitis.        Electronically Signed    By: San Morelle M.D.    On: 12/28/2020 07:23   He reports that he has never smoked. He has quit using smokeless tobacco.  His smokeless tobacco use included snuff. No results for input(s): "HGBA1C", "LABURIC" in the last 8760 hours.  Objective:  VS:  HT:    WT:   BMI:     BP:(!) 207/85  HR:68bpm  TEMP: ( )  RESP:  Physical Exam Vitals and nursing note reviewed.  HENT:     Head: Normocephalic and atraumatic.     Right Ear: External ear normal.     Left Ear: External ear normal.     Nose: Nose normal.     Mouth/Throat:     Mouth: Mucous membranes are moist.  Eyes:     Extraocular Movements: Extraocular movements intact.  Cardiovascular:     Rate and Rhythm: Normal rate.     Pulses: Normal pulses.  Pulmonary:     Effort: Pulmonary effort is normal.  Abdominal:     General: Abdomen is  flat. There is no distension.  Musculoskeletal:        General: Tenderness present.     Cervical back: Normal range of motion.     Comments: Pt rises from seated position to standing without difficulty. Good lumbar range of motion. Strong distal strength without clonus, no pain upon palpation of greater trochanters. Sensation intact bilaterally. Multiple palpable trigger points noted to left paraspinal regions. Walks independently, gait steady.   Skin:    General: Skin is warm and dry.     Capillary Refill: Capillary refill takes less than 2 seconds.  Neurological:     General: No focal deficit present.     Mental Status: He is alert and oriented to person, place, and time.  Psychiatric:        Mood and Affect: Mood normal.        Behavior: Behavior normal.     Ortho Exam  Imaging: CT Abdomen Pelvis W Contrast  Result  Date: 11/05/2021 CLINICAL DATA:  71 year old male with history of left lower quadrant abdominal pain. EXAM: CT ABDOMEN AND PELVIS WITH CONTRAST TECHNIQUE: Multidetector CT imaging of the abdomen and pelvis was performed using the standard protocol following bolus administration of intravenous contrast. RADIATION DOSE REDUCTION: This exam was performed according to the departmental dose-optimization program which includes automated exposure control, adjustment of the mA and/or kV according to patient size and/or use of iterative reconstruction technique. CONTRAST:  1105m OMNIPAQUE IOHEXOL 300 MG/ML  SOLN COMPARISON:  CT of the abdomen and pelvis 01/24/2020. FINDINGS: Lower chest: Mild scarring in the lung bases bilaterally. Large distal paraesophageal varices. Hepatobiliary: Tiny subcentimeter low-attenuation lesion in segment 6 of the liver, too small to characterize, but statistically likely a tiny cyst or biliary hamartoma. No other suspicious appearing hepatic lesions. No intra or extrahepatic biliary ductal dilatation. Gallbladder is normal in appearance. Pancreas: No pancreatic mass. No pancreatic ductal dilatation. No pancreatic or peripancreatic fluid collections or inflammatory changes. Spleen: Unremarkable. Adrenals/Urinary Tract: Mild multifocal cortical thinning in the right kidney. No suspicious renal lesions. No hydroureteronephrosis. Urinary bladder is normal in appearance. Bilateral adrenal glands are unremarkable in appearance. Stomach/Bowel: Postoperative changes of Roux-en-Y gastric bypass. No pathologic dilatation of small bowel or colon. Normal appendix. Postoperative changes of partial colectomy are noted in the region of the sigmoid colon. Vascular/Lymphatic: Atherosclerotic calcifications in the abdominal aorta and pelvic vasculature. No aneurysm or dissection noted in the abdominal or pelvic vasculature. 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 No lymphadenopathy noted in the abdomen or  pelvis. Reproductive: Prostate gland and seminal vesicles are unremarkable in appearance. Other: No significant volume of ascites.  No pneumoperitoneum. Musculoskeletal: There are no aggressive appearing lytic or blastic lesions noted in the visualized portions of the skeleton. IMPRESSION: 1. No acute findings are noted in the abdomen or pelvis to account for the patient's symptoms. 2. 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 3. Aortic atherosclerosis. 4. Postoperative changes and incidental findings, as above. Electronically Signed   By: DVinnie LangtonM.D.   On: 11/05/2021 08:03   ECHOCARDIOGRAM COMPLETE  Result Date: 11/04/2021    ECHOCARDIOGRAM REPORT   Patient Name:   JKAMRAN COKERDate of Exam: 11/04/2021 Medical Rec #:  [id number]     Height:       66.0 in Accession #:    [id number]    Weight:       129.4 lb Date of Birth:  January 13, 1951     BSA:          1.662 m Patient Age:    24 years       BP:           178/99 mmHg Patient Gender: M              HR:           79 bpm. Exam Location:  [place] Procedure: 2D Echo, 3D Echo, Cardiac Doppler and Color Doppler Indications:    R60 Edema  History:        Patient has no prior history of Echocardiogram examinations.                 Risk Factors:Hypertension and HLD.  Sonographer:    Marygrace Drought RCS Referring Phys: [place]  1. Left ventricular ejection fraction, by estimation, is 55 to 60%. The left ventricle has normal function. The left ventricle has no regional wall motion abnormalities. Left ventricular diastolic parameters are consistent with Grade I diastolic dysfunction (impaired relaxation).  2. Right ventricular systolic function is normal. The right ventricular size is normal. There is normal pulmonary artery systolic pressure.  3. The mitral valve is normal in structure. Trivial mitral valve regurgitation.  No evidence of mitral stenosis.  4. The aortic valve is normal in structure. Aortic valve regurgitation is not visualized. No aortic stenosis is present.  5. The inferior vena cava is normal in size with greater than 50% respiratory variability, suggesting right atrial pressure of 3 mmHg. FINDINGS  Left Ventricle: Left ventricular ejection fraction, by estimation, is 55 to 60%. The left ventricle has normal function. The left ventricle has no regional wall motion abnormalities. The left ventricular internal cavity size was normal in size. There is  no left ventricular hypertrophy. Left ventricular diastolic parameters are consistent with Grade I diastolic dysfunction (impaired relaxation). Right Ventricle: The right ventricular size is normal. No increase in right ventricular wall thickness. Right ventricular systolic function is normal. There is normal pulmonary artery systolic pressure. The tricuspid regurgitant velocity is 2.32 m/s, and  with an assumed right atrial pressure of 3 mmHg, the estimated right ventricular systolic pressure is 27.0 mmHg. Left Atrium: Left atrial size was normal in size. Right Atrium: Right atrial size was normal in size. Pericardium: There is no evidence of pericardial effusion. Mitral Valve: The mitral valve is normal in structure. Trivial mitral valve regurgitation. No evidence of mitral valve stenosis. Tricuspid Valve: The tricuspid valve is normal in structure. Tricuspid valve regurgitation is mild . No evidence of tricuspid stenosis. Aortic Valve: The aortic valve is normal in structure. Aortic valve regurgitation is not visualized. No aortic stenosis is present. Pulmonic Valve: The pulmonic valve was normal in structure. Pulmonic valve regurgitation is trivial. No evidence of pulmonic stenosis. Aorta: The aortic root is normal in size and structure. Venous: The inferior vena cava is normal in size with greater than 50% respiratory variability, suggesting right atrial pressure of  3 mmHg. IAS/Shunts: No atrial level shunt detected by color flow Doppler.  LEFT VENTRICLE PLAX 2D LVIDd:         4.10 cm   Diastology LVIDs:         2.60 cm   LV e' medial:    6.42 cm/s LV PW:         0.90 cm   LV E/e' medial:  10.5 LV IVS:        1.00 cm  LV e' lateral:   9.03 cm/s LVOT diam:     2.20 cm   LV E/e' lateral: 7.5 LV SV:         71 LV SV Index:   43 LVOT Area:     3.80 cm                           3D Volume EF:                          3D EF:        50 %                          LV EDV:       122 ml                          LV ESV:       61 ml                          LV SV:        61 ml RIGHT VENTRICLE RV Basal diam:  3.40 cm RV S prime:     10.30 cm/s TAPSE (M-mode): 1.6 cm RVSP:           24.5 mmHg LEFT ATRIUM             Index        RIGHT ATRIUM           Index LA diam:        2.90 cm 1.74 cm/m   RA Pressure: 3.00 mmHg LA Vol (A2C):   31.4 ml 18.89 ml/m  RA Area:     11.10 cm LA Vol (A4C):   27.1 ml 16.31 ml/m  RA Volume:   21.50 ml  12.94 ml/m LA Biplane Vol: 31.4 ml 18.89 ml/m  AORTIC VALVE LVOT Vmax:   98.20 cm/s LVOT Vmean:  66.900 cm/s LVOT VTI:    0.188 m  AORTA Ao Root diam: 3.40 cm Ao Asc diam:  3.50 cm MITRAL VALVE               TRICUSPID VALVE MV Area (PHT):             TR Peak grad:   21.5 mmHg MV Decel Time:             TR Vmax:        232.00 cm/s MV E velocity: 67.60 cm/s  Estimated RAP:  3.00 mmHg MV A velocity: 99.00 cm/s  RVSP:           24.5 mmHg MV E/A ratio:  0.68                            SHUNTS                            Systemic VTI:  0.19 m                            Systemic Diam: 2.20 cm Glori Bickers MD Electronically signed by Glori Bickers MD Signature Date/Time: 11/04/2021/5:23:44 PM    Final     Past Medical/Family/Surgical/Social History: Medications &  Allergies reviewed per EMR, new medications updated. 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    Bilateral inguinal hernia 09/28/2018   Incisional hernia 03/17/2018   Trigger thumb, left thumb 01/19/2018   Trigger finger, left index finger 07/18/2017   Trigger finger, right ring finger 01/31/2017   Trigger finger of left thumb 01/03/2017   Trigger finger of right thumb 01/03/2017   Trigger index finger of left hand 01/03/2017   Trigger index finger of right hand 01/03/2017   Malnutrition of moderate degree 07/19/2016   Intra-abdominal abscess ([place]) 07/16/2016   Cellulitis 07/15/2016   S/P colostomy takedown 06/28/2016   Pain in thoracic spine 06/09/2016   Mid back pain 06/09/2016   Postoperative fever 11/19/2015   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   Severe protein-calorie malnutrition ([place]) 11/17/2015   Sepsis ([place]) 11/14/2015   Hiccups 11/14/2015   AKI (acute kidney injury) ([place]) 11/14/2015   Fever    Leg swelling    Left shoulder pain    Muscle spasm of left shoulder    GI bleed 10/27/2015   Acute blood loss anemia 10/27/2015   Syncope 10/27/2015   Hyperglycemia 10/27/2015   Hypotension 10/27/2015   Neck pain 10/27/2015   Hematemesis 10/27/2015   Hematochezia 10/27/2015   Diverticulitis of colon with perforation 07/05/2015   Past Medical History:  Diagnosis Date   Anemia    Anxiety    BPH (benign prostatic hyperplasia)    Gastritis    GERD (gastroesophageal reflux disease)    "seldom" (07/16/2016)   GI bleed due to NSAIDs 10/27/2015   History of blood transfusion 06/2016   post OR/notes 07/15/2016   History of hiatal hernia    History of kidney stones    surgery to remove stone   Hyperlipidemia    Hypertension    no meds    Melanoma of back ([place])    "mid back"   Sigmoid diverticulitis    with perforation   Family History  Problem Relation Age of Onset   Stroke Mother    Stroke Brother    Heart disease Brother    Past Surgical History:  Procedure Laterality Date   COLON SURGERY     sigmoid   COLOSTOMY TAKEDOWN N/A 06/28/2016   Procedure:  COLOSTOMY TAKEDOWN;  Surgeon: [name], MD;  Location: [facility];  Service: General;  Laterality: N/A;   CYSTOSCOPY WITH RETROGRADE PYELOGRAM, URETEROSCOPY AND STENT PLACEMENT Bilateral 02/12/2020   Procedure: CYSTOSCOPY WITH BILATERAL RETROGRADE PYELOGRAM,  [place];  Surgeon: Remi Haggard, MD;  Location: [facility];  Service: Urology;  Laterality: Bilateral;  1 HR   ESOPHAGOGASTRODUODENOSCOPY N/A 10/27/2015   Procedure: ESOPHAGOGASTRODUODENOSCOPY (EGD);  Surgeon: Clarene Essex, MD;  Location[facility] ENDOSCOPY;  Service: Endoscopy;  Laterality: N/A;   ESOPHAGOGASTRODUODENOSCOPY N/A 10/29/2015   Procedure: ESOPHAGOGASTRODUODENOSCOPY (EGD);  Surgeon: Ronald Lobo, MD;  Location: [facility] ENDOSCOPY;  Service: Endoscopy;  Laterality: N/A;   ESOPHAGOGASTRODUODENOSCOPY N/A 10/30/2015   Procedure: ESOPHAGOGASTRODUODENOSCOPY (EGD);  Surgeon: Clarene Essex, MD;  Location: [facility] ENDOSCOPY;  Service: Endoscopy;  Laterality: N/A;   g tube discontinued  04/2020   per patient   GASTROSTOMY TUBE PLACEMENT  11/21/2015   REDUCTION OF HIATAL HERNIA , REPAIR HIATAL HERNIA, RESECTION SMALL BOWEL WITH ANASTOMOSIS, PLACEMENT GASTROSTOMY TUBE, PLACEMENT DUODENOSTOMY TUBE (N/A)   HEMORRHOID BANDING  X 2   HERNIA REPAIR     HIATAL HERNIA REPAIR N/A 11/21/2015  Procedure: REDUCTION OF HIATAL HERNIA , REPAIR HIATAL HERNIA, RESECTION SMALL BOWEL WITH ANASTOMOSIS, PLACEMENT GASTROSTOMY TUBE, PLACEMENT DUODENOSTOMY TUBE;  Surgeon: Mickeal Skinner, MD;  Location: [facility];  Service: General;  Laterality: N/A;   HOLMIUM LASER APPLICATION Right 16/12/9602   Procedure: HOLMIUM LASER APPLICATION;  Surgeon: Remi Haggard, MD;  Location: [facility];  Service: Urology;  Laterality: Right;   INCISIONAL HERNIA REPAIR  06/28/2016   open/notes 07/15/2016   INCISIONAL HERNIA REPAIR  03/17/2018   WITH MESH   INCISIONAL HERNIA REPAIR N/A 03/17/2018   Procedure: INCISIONAL HERNIA REPAIR WITH MESH;  Surgeon: [name], MD;  Location: [place];   Service: General;  Laterality: N/A;   [place] N/A 10/16/2020   Procedure: Fatima Blank HERNIA REPAIR WITH MESH;  Surgeon: [name], MD;  Location: [place] [facility];  Service: General;  Laterality: N/A;   INGUINAL HERNIA REPAIR Bilateral 09/28/2018   INGUINAL HERNIA REPAIR Bilateral 09/28/2018   Procedure: BILATERAL OPEN INGUINAL HERNIA REPAIR WITH MESH;  Surgeon: [name], MD;  Location: [facility];  Service: General;  Laterality: Bilateral;  GENERAL AND TAP BLOCK   INSERTION OF MESH N/A 03/17/2018   Procedure: INSERTION OF MESH;  Surgeon: [name], MD;  Location: [place];  Service: General;  Laterality: N/A;   INSERTION OF MESH Bilateral 09/28/2018   Procedure: Insertion Of Mesh;  Surgeon: [name], MD;  Location: [place];  Service: General;  Laterality: Bilateral;   IR CM INJ ANY COLONIC TUBE W/FLUORO  02/04/2017   IR GUIDED DRAIN W CATHETER PLACEMENT  07/06/2016   /NOTES 07/15/2016   IR PATIENT EVAL TECH 0-60 MINS  06/28/2019   IR RADIOLOGIST EVAL & MGMT  07/27/2016   IR RADIOLOGIST EVAL & MGMT  08/17/2016   IR RADIOLOGIST EVAL & MGMT  08/26/2016   IR REPLACE G-TUBE SIMPLE WO FLUORO  07/28/2017   IR REPLACE G-TUBE SIMPLE WO FLUORO  01/31/2018   IR REPLACE G-TUBE SIMPLE WO FLUORO  10/16/2018   IR REPLACE G-TUBE SIMPLE WO FLUORO  03/05/2019   IR REPLACE G-TUBE SIMPLE WO FLUORO  08/22/2019   IR REPLACE G-TUBE SIMPLE WO FLUORO  01/08/2020   IR [place] GASTRO/COLONIC TUBE PERCUT W/FLUORO  08/04/2016   IR [place] GASTRO/COLONIC TUBE PERCUT W/FLUORO  01/14/2017   IR US GUIDE BX ASP/DRAIN  08/04/2016   KNEE CARTILAGE SURGERY Right 1971   "opened me up"   LAPAROTOMY N/A 07/05/2015   Procedure: PARTIAL SIGMOID COLECTOMY AND COLOSTOMY;  Surgeon: [name], MD;  Location: [facility];  Service: General;  Laterality: N/A;   MELANOMA EXCISION  2001   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 [place]  Vaping Use   Vaping Use: Never used  Substance and Sexual Activity   Alcohol use: No   Drug use: No   Sexual activity: Not Currently

## 2021-11-05 NOTE — ED Notes (Signed)
Pt went to check on the pt, Pt was not there. EDP notified

## 2021-11-05 NOTE — Progress Notes (Signed)
Pt state middle back pain that travels to his buttocks, mostly the left side. Pt state walking, standing bending makes the pain worse. Pt state he takes pain meds to help ease his pain.  Numeric Pain Rating Scale and Functional Assessment Average Pain 8 Pain Right Now 5 My pain is intermittent, sharp, burning, dull, tingling, and aching Pain is worse with: walking, bending, sitting, standing, and some activites Pain improves with: medication and injections   In the last MONTH (on 0-10 scale) has pain interfered with the following?  1. General activity like being  able to carry out your everyday physical activities such as walking, climbing stairs, carrying groceries, or moving a chair?  Rating(5)  2. Relation with others like being able to carry out your usual social activities and roles such as  activities at home, at work and in your community. Rating(6)  3. Enjoyment of life such that you have  been bothered by emotional problems such as feeling anxious, depressed or irritable?  Rating(7)

## 2021-11-05 NOTE — ED Notes (Signed)
PATIENT HAS REQUEST FOR IV TO COME TOOK OUT IV

## 2021-11-05 NOTE — ED Provider Notes (Signed)
[facility] EMERGENCY DEPARTMENT Provider Note   CSN: [id number] Arrival date & time: 11/05/21  0100     History  Chief Complaint  Patient presents with   Abdominal Pain    Miguel [name] is a 71 y.o. male with past medical history of GERD, chronic back pain,, hypertension, hyperlipidemia, sigmoid diverticulitis, prior GI bleed secondary to NSAID use, with prior perforation and prior renal calculi presents to the Emergency Department with 3 days of acute on chronic left hemiabdominal pain.  Patient states that he has chronic left-sided abdominal pain secondary to his multiple abdominal surgeries and that have gotten gradually worse over the past 3 days.  He has had no associated chills, fevers, nausea, vomiting or blood in stool.  He denies melena.  He has been tolerating oral intake well.  He states that the pain is in the similar location to his prior renal calculi although his pain is constant as opposed to intermittent with his prior renal calculi.  He has had no associated urinary symptoms.  No hematuria.  Patient denies any chest pain or shortness of breath.  He is complaining of predominantly left lower quadrant and left flank pain.   Abdominal Pain Associated symptoms: no chest pain, no chills, no cough, no dysuria, no fever, no hematuria, no shortness of breath, no sore throat and no vomiting        Home Medications Prior to Admission medications   Medication Sig Start Date End Date Taking? Authorizing Provider  acetaminophen (TYLENOL) 500 MG tablet Take 1 tablet (500 mg total) by mouth every 8 (eight) hours as needed for mild pain (for pain). Patient taking differently: Take 250-500 mg by mouth every 4 (four) hours as needed for mild pain or fever. 12/05/15   Jill Alexanders, PA-C  diazepam (VALIUM) 5 MG tablet Take one tablet by mouth with food one hour prior to procedure. May repeat 30 minutes prior if needed. 04/14/21   Lorine Bears, NP  diclofenac  sodium (VOLTAREN) 1 % GEL Apply 2 g topically 3 (three) times daily as needed (joint pain).     [provider]  famotidine (PEPCID) 20 MG tablet Take 20 mg by mouth 2 (two) times daily.     [provider]  HYDROcodone-acetaminophen (NORCO/VICODIN) 5-325 MG tablet Take 1 tablet by mouth 2 (two) times daily as needed for moderate pain. 07/06/21   Mcarthur Rossetti, MD  LORazepam (ATIVAN) 1 MG tablet Take 0.5 mg by mouth daily as needed for anxiety. 05/29/18   [provider]  methocarbamol (ROBAXIN) 500 MG tablet Take 250-500 mg by mouth every 8 (eight) hours as needed for muscle spasms.  07/11/18   [provider]  methylPREDNISolone (MEDROL) 4 MG tablet Medrol dose pack. Take as instructed Patient not taking: Reported on 10/23/2021 11/24/20   Mcarthur Rossetti, MD  Multiple Vitamin (MULTIVITAMIN WITH MINERALS) TABS tablet Take 1 tablet by mouth daily.    [provider]  ondansetron (ZOFRAN-ODT) 4 MG disintegrating tablet Take 4 mg by mouth every 6 (six) hours as needed for nausea/vomiting. 07/11/18   [provider]  oxyCODONE (OXY IR/ROXICODONE) 5 MG immediate release tablet Take 1 tablet (5 mg total) by mouth every 6 (six) hours as needed for moderate pain, severe pain or breakthrough pain. 10/20/20   [name], MD  pantoprazole (PROTONIX) 40 MG tablet Take 40 mg by mouth 2 (two) times daily. 01/18/18   [provider]  polyethylene glycol (MIRALAX /  GLYCOLAX) 17 g packet Take 17 g by mouth daily as needed for mild constipation.    [provider]  Probiotic Product (PROBIOTIC DAILY PO) Take 1 capsule by mouth daily.    [provider]  tamsulosin (FLOMAX) 0.4 MG CAPS capsule Take 1 capsule (0.4 mg total) by mouth daily. 01/04/16   Fanny Skates, MD  tiZANidine (ZANAFLEX) 2 MG tablet Take 1-2 mg by mouth 2 (two) times daily as needed for muscle spasms. 01/16/20   [provider]  traMADol  (ULTRAM) 50 MG tablet Take 50 mg by mouth every 6 (six) hours as needed for moderate pain. 05/21/16   [provider]  vitamin B-12 (CYANOCOBALAMIN) 1000 MCG tablet Take 1,000 mcg by mouth daily.     [provider]  zolpidem (AMBIEN) 10 MG tablet Take 10 mg by mouth at bedtime.    [provider]      Allergies    Patient has no known allergies.    Review of Systems   Review of Systems  Constitutional:  Negative for chills and fever.  HENT:  Negative for ear pain and sore throat.   Eyes:  Negative for pain and visual disturbance.  Respiratory:  Negative for cough and shortness of breath.   Cardiovascular:  Negative for chest pain and palpitations.  Gastrointestinal:  Positive for abdominal pain. Negative for vomiting.  Genitourinary:  Positive for flank pain. Negative for dysuria and hematuria.  Musculoskeletal:  Negative for arthralgias and back pain.  Skin:  Negative for color change and rash.  Neurological:  Negative for seizures and syncope.  All other systems reviewed and are negative.   Physical Exam Updated Vital Signs BP (!) 161/98 (BP Location: Right Arm)   Pulse 78   Temp 97.9 F (36.6 C) (Oral)   Resp 16   Ht '5\' 6"'$  (1.676 m)   Wt 58.7 kg   SpO2 98%   BMI 20.89 kg/m  Physical Exam Vitals and nursing note reviewed.  Constitutional:      General: He is not in acute distress.    Appearance: He is well-developed.     Comments: Upon entering the exam room, the patient is sitting upright awake and alert no acute distress  HENT:     Head: Normocephalic and atraumatic.  Eyes:     Conjunctiva/sclera: Conjunctivae normal.  Cardiovascular:     Rate and Rhythm: Normal rate and regular rhythm.     Heart sounds: No murmur heard. Pulmonary:     Effort: Pulmonary effort is normal. No respiratory distress.     Breath sounds: Normal breath sounds.  Abdominal:     Palpations: Abdomen is soft.     Tenderness: There is no abdominal tenderness.      Comments: Very mild tenderness to the left lower quadrant and left upper quadrant.  No significant CVA tenderness.  There is no rebound or guarding.    Genitourinary:    Comments: Testicular exam reveals symmetric and nontender testes.  No overlying skin changes.  Both are in vertical lie. Musculoskeletal:        General: No swelling.     Cervical back: Neck supple.  Skin:    General: Skin is warm and dry.     Capillary Refill: Capillary refill takes less than 2 seconds.  Neurological:     Mental Status: He is alert.     Comments: Patient is fully alert and oriented moving all extremity spontaneously and is grossly neurologically intact  Psychiatric:  Mood and Affect: Mood normal.     ED Results / Procedures / Treatments   Labs (all labs ordered are listed, but only abnormal results are displayed) Labs Reviewed  CBC WITH DIFFERENTIAL/PLATELET - Abnormal; Notable for the following components:      Result Value   RBC 4.07 (*)    All other components within normal limits  URINALYSIS, ROUTINE W REFLEX MICROSCOPIC - Abnormal; Notable for the following components:   Color, Urine COLORLESS (*)    Specific Gravity, Urine 1.003 (*)    All other components within normal limits  COMPREHENSIVE METABOLIC PANEL  LIPASE, BLOOD    EKG None  Radiology CT Abdomen Pelvis W Contrast  Result Date: 11/05/2021 CLINICAL DATA:  71 year old male with history of left lower quadrant abdominal pain. EXAM: CT ABDOMEN AND PELVIS WITH CONTRAST TECHNIQUE: Multidetector CT imaging of the abdomen and pelvis was performed using the standard protocol following bolus administration of intravenous contrast. RADIATION DOSE REDUCTION: This exam was performed according to the departmental dose-optimization program which includes automated exposure control, adjustment of the mA and/or kV according to patient size and/or use of iterative reconstruction technique. CONTRAST:  126m OMNIPAQUE IOHEXOL 300 MG/ML  SOLN  COMPARISON:  CT of the abdomen and pelvis 01/24/2020. FINDINGS: Lower chest: Mild scarring in the lung bases bilaterally. Large distal paraesophageal varices. Hepatobiliary: Tiny subcentimeter low-attenuation lesion in segment 6 of the liver, too small to characterize, but statistically likely a tiny cyst or biliary hamartoma. No other suspicious appearing hepatic lesions. No intra or extrahepatic biliary ductal dilatation. Gallbladder is normal in appearance. Pancreas: No pancreatic mass. No pancreatic ductal dilatation. No pancreatic or peripancreatic fluid collections or inflammatory changes. Spleen: Unremarkable. Adrenals/Urinary Tract: Mild multifocal cortical thinning in the right kidney. No suspicious renal lesions. No hydroureteronephrosis. Urinary bladder is normal in appearance. Bilateral adrenal glands are unremarkable in appearance. Stomach/Bowel: Postoperative changes of Roux-en-Y gastric bypass. No pathologic dilatation of small bowel or colon. Normal appendix. Postoperative changes of partial colectomy are noted in the region of the sigmoid colon. Vascular/Lymphatic: Atherosclerotic calcifications in the abdominal aorta and pelvic vasculature. No aneurysm or dissection noted in the abdominal or pelvic vasculature. 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 No lymphadenopathy noted in the abdomen or pelvis. Reproductive: Prostate gland and seminal vesicles are unremarkable in appearance. Other: No significant volume of ascites.  No pneumoperitoneum. Musculoskeletal: There are no aggressive appearing lytic or blastic lesions noted in the visualized portions of the skeleton. IMPRESSION: 1. No acute findings are noted in the abdomen or pelvis to account for the patient's symptoms. 2. 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 3. Aortic atherosclerosis. 4. Postoperative changes and incidental findings,  as above. Electronically Signed   By: DVinnie LangtonM.D.   On: 11/05/2021 08:03   ECHOCARDIOGRAM COMPLETE  Result Date: 11/04/2021    ECHOCARDIOGRAM REPORT   Patient Name:   Miguel STAEBELLDate of Exam: 11/04/2021 Medical Rec #:  [id number]     Height:       66.0 in Accession #:    [id number]    Weight:       129.4 lb Date of Birth:  1September 29, 1952    BSA:          1.662 m Patient Age:    89years       BP:           178/99 mmHg Patient  Gender: M              HR:           79 bpm. Exam Location:  [address] Procedure: 2D Echo, 3D Echo, Cardiac Doppler and Color Doppler Indications:    R60 Edema  History:        Patient has no prior history of Echocardiogram examinations.                 Risk Factors:Hypertension and HLD.  Sonographer:    Marygrace Drought RCS Referring Phys: [place]  1. Left ventricular ejection fraction, by estimation, is 55 to 60%. The left ventricle has normal function. The left ventricle has no regional wall motion abnormalities. Left ventricular diastolic parameters are consistent with Grade I diastolic dysfunction (impaired relaxation).  2. Right ventricular systolic function is normal. The right ventricular size is normal. There is normal pulmonary artery systolic pressure.  3. The mitral valve is normal in structure. Trivial mitral valve regurgitation. No evidence of mitral stenosis.  4. The aortic valve is normal in structure. Aortic valve regurgitation is not visualized. No aortic stenosis is present.  5. The inferior vena cava is normal in size with greater than 50% respiratory variability, suggesting right atrial pressure of 3 mmHg. FINDINGS  Left Ventricle: Left ventricular ejection fraction, by estimation, is 55 to 60%. The left ventricle has normal function. The left ventricle has no regional wall motion abnormalities. The left ventricular internal cavity size was normal in size. There is  no left ventricular hypertrophy. Left ventricular diastolic  parameters are consistent with Grade I diastolic dysfunction (impaired relaxation). Right Ventricle: The right ventricular size is normal. No increase in right ventricular wall thickness. Right ventricular systolic function is normal. There is normal pulmonary artery systolic pressure. The tricuspid regurgitant velocity is 2.32 m/s, and  with an assumed right atrial pressure of 3 mmHg, the estimated right ventricular systolic pressure is 56.4 mmHg. Left Atrium: Left atrial size was normal in size. Right Atrium: Right atrial size was normal in size. Pericardium: There is no evidence of pericardial effusion. Mitral Valve: The mitral valve is normal in structure. Trivial mitral valve regurgitation. No evidence of mitral valve stenosis. Tricuspid Valve: The tricuspid valve is normal in structure. Tricuspid valve regurgitation is mild . No evidence of tricuspid stenosis. Aortic Valve: The aortic valve is normal in structure. Aortic valve regurgitation is not visualized. No aortic stenosis is present. Pulmonic Valve: The pulmonic valve was normal in structure. Pulmonic valve regurgitation is trivial. No evidence of pulmonic stenosis. Aorta: The aortic root is normal in size and structure. Venous: The inferior vena cava is normal in size with greater than 50% respiratory variability, suggesting right atrial pressure of 3 mmHg. IAS/Shunts: No atrial level shunt detected by color flow Doppler.  LEFT VENTRICLE PLAX 2D LVIDd:         4.10 cm   Diastology LVIDs:         2.60 cm   LV e' medial:    6.42 cm/s LV PW:         0.90 cm   LV E/e' medial:  10.5 LV IVS:        1.00 cm   LV e' lateral:   9.03 cm/s LVOT diam:     2.20 cm   LV E/e' lateral: 7.5 LV SV:         71 LV SV Index:   43 LVOT Area:  3.80 cm                           3D Volume EF:                          3D EF:        50 %                          LV EDV:       122 ml                          LV ESV:       61 ml                          LV SV:        61 ml  RIGHT VENTRICLE RV Basal diam:  3.40 cm RV S prime:     10.30 cm/s TAPSE (M-mode): 1.6 cm RVSP:           24.5 mmHg LEFT ATRIUM             Index        RIGHT ATRIUM           Index LA diam:        2.90 cm 1.74 cm/m   RA Pressure: 3.00 mmHg LA Vol (A2C):   31.4 ml 18.89 ml/m  RA Area:     11.10 cm LA Vol (A4C):   27.1 ml 16.31 ml/m  RA Volume:   21.50 ml  12.94 ml/m LA Biplane Vol: 31.4 ml 18.89 ml/m  AORTIC VALVE LVOT Vmax:   98.20 cm/s LVOT Vmean:  66.900 cm/s LVOT VTI:    0.188 m  AORTA Ao Root diam: 3.40 cm Ao Asc diam:  3.50 cm MITRAL VALVE               TRICUSPID VALVE MV Area (PHT):             TR Peak grad:   21.5 mmHg MV Decel Time:             TR Vmax:        232.00 cm/s MV E velocity: 67.60 cm/s  Estimated RAP:  3.00 mmHg MV A velocity: 99.00 cm/s  RVSP:           24.5 mmHg MV E/A ratio:  0.68                            SHUNTS                            Systemic VTI:  0.19 m                            Systemic Diam: 2.20 cm Glori Bickers MD Electronically signed by Glori Bickers MD Signature Date/Time: 11/04/2021/5:23:44 PM    Final     Procedures Procedures    Medications Ordered in ED Medications  iohexol (OMNIPAQUE) 300 MG/ML solution 100 mL (100 mLs Intravenous Contrast Given 11/05/21 0744)    ED Course/ Medical Decision Making/ A&P Clinical Course as of 11/05/21 1148  Thu Nov 05, 2021  0901  1 YOM with extensive medical history. 4 Days abdominal pain, negative CT. Normal labs. CT with full bladder, might be worth considering urinary retention as cause.  Possible discharge depending on HPI/exam.  [CC]    Clinical Course User Index [CC] Tretha Sciara, MD                           Medical Decision Making Amount and/or Complexity of Data Reviewed Independent Historian: spouse Radiology: ordered and independent interpretation performed. Decision-making details documented in ED Course.     Patient presents the emergency department hemodynamically stable, afebrile  and with 3 days of acute on chronic left lower quadrant and flank pain in the setting of a benign exam as above.  Differential diagnosis includes mild diverticulitis versus musculoskeletal pain versus less likely small bowel obstruction in the setting of normal stool output versus intra-abdominal infectious process such as abscess in the setting of multiple prior surgeries versus recurrent calculus.  Given predominantly lower quadrant pain and flank pain do not suspect atypical angina.  I personally reviewed and interpreted the patient CT scan of the abdomen pelvis which reveals no acute intra-abdominal findings.  There was incidental finding of new portal venous dilation as well as distal esophageal varices concerning for new portal pretension.  This result was communicated with the patient.  Patient was informed that he must follow-up with his primary care physician to further address his questionable new portal hypertension.  He denies alcohol consumption on a regular basis.   I have personally reviewed and interpreted the patient's laboratory workup which was remarkable for lack of blood in urinalysis and thus less likely stone.  Also patient has no associated leukocytosis, AKI, obstructive biliary pattern, or rising AST or ALT.  Hemoglobin of 13.3 is much higher than prior values.  Given completely benign laboratory workup and radiographic workup for acute pathology, will discharge patient with strict return precautions for inability to tolerate oral intake, confusion or fever.  Patient was instructed to follow-up with his regular primary care physician in the next several days to address his incidentally noted questionable portal hypertension as above.  Patient had eloped prior to attending bedside evaluation however patient's care and plan was discussed with attending Dr. Billy Fischer.         Final Clinical Impression(s) / ED Diagnoses Final diagnoses:  Left lower quadrant abdominal pain     Rx / DC Orders ED Discharge Orders     None         Levie Heritage, MD 11/05/21 1553    Gareth Morgan, MD 11/05/21 2138

## 2021-11-05 NOTE — ED Provider Triage Note (Signed)
Emergency Medicine Provider Triage Evaluation Note  Miguel [name] , a 71 y.o. male  was evaluated in triage.  Pt complains of left sided abdominal pain x 4 days. Constant, left flank to LLQ, no alleviating/aggravating factors. Associated nausea. 1 episode of diarrhea yesterday and some urinary frequency. Denies fever, chills, emesis, melena, hematochezia, or hematuria.   Multiple prior abdominal surgeries.  Review of Systems  Per above Physical Exam  BP (!) 180/112 (BP Location: Right Arm)   Pulse 92   Temp 98.2 F (36.8 C) (Oral)   Resp 18   SpO2 98%  Gen:   Awake, no distress   Resp:  Normal effort  MSK:   Moves extremities without difficulty  Other:  Surgical scars to abdomen. LUQ/LLQ TTP. No peritoneal signs.   Medical Decision Making  Medically screening exam initiated at 1:51 AM.  Appropriate orders placed.  [name] was informed that the remainder of the evaluation will be completed by another provider, this initial triage assessment does not replace that evaluation, and the importance of remaining in the ED until their evaluation is complete.  Abdominal pain.    Miguel [name], [place] 11/05/21 [phone]

## 2021-11-16 ENCOUNTER — Encounter: Payer: Self-pay | Admitting: Rehabilitative and Restorative Service Providers"

## 2021-11-16 ENCOUNTER — Other Ambulatory Visit: Payer: Self-pay

## 2021-11-16 ENCOUNTER — Ambulatory Visit (INDEPENDENT_AMBULATORY_CARE_PROVIDER_SITE_OTHER): Payer: Medicare Other | Admitting: Rehabilitative and Restorative Service Providers"

## 2021-11-16 DIAGNOSIS — R293 Abnormal posture: Secondary | ICD-10-CM

## 2021-11-16 DIAGNOSIS — M546 Pain in thoracic spine: Secondary | ICD-10-CM | POA: Diagnosis not present

## 2021-11-16 DIAGNOSIS — M5459 Other low back pain: Secondary | ICD-10-CM

## 2021-11-16 NOTE — Therapy (Signed)
OUTPATIENT PHYSICAL THERAPY EVALUATION   Patient Name: Miguel [name] MRN: [id number] DOB:July 04, 1950, 71 y.o., male Today's Date: 11/16/2021   PT End of Session - 11/16/21 1156     Visit Number 1    Number of Visits 20    Date for PT Re-Evaluation 01/25/22    Progress Note Due on Visit 10    PT Start Time 1150    PT Stop Time 1228    PT Time Calculation (min) 38 min    Activity Tolerance Patient tolerated treatment well    Behavior During Therapy WFL for tasks assessed/performed             Past Medical History:  Diagnosis Date   Anemia    Anxiety    BPH (benign prostatic hyperplasia)    Gastritis    GERD (gastroesophageal reflux disease)    "seldom" (07/16/2016)   GI bleed due to NSAIDs 10/27/2015   History of blood transfusion 06/2016   post OR/notes 07/15/2016   History of hiatal hernia    History of kidney stones    surgery to remove stone   Hyperlipidemia    Hypertension    no meds    Melanoma of back ([place])    "mid back"   Sigmoid diverticulitis    with perforation   Past Surgical History:  Procedure Laterality Date   COLON SURGERY     sigmoid   COLOSTOMY TAKEDOWN N/A 06/28/2016   Procedure: COLOSTOMY TAKEDOWN;  Surgeon: [name], MD;  Location: [facility];  Service: General;  Laterality: N/A;   CYSTOSCOPY WITH RETROGRADE PYELOGRAM, URETEROSCOPY AND STENT PLACEMENT Bilateral 02/12/2020   Procedure: CYSTOSCOPY WITH BILATERAL RETROGRADE PYELOGRAM,  AND LITHOPEXY;  Surgeon: Remi Haggard, MD;  Location: [facility];  Service: Urology;  Laterality: Bilateral;  1 HR   ESOPHAGOGASTRODUODENOSCOPY N/A 10/27/2015   Procedure: ESOPHAGOGASTRODUODENOSCOPY (EGD);  Surgeon: Clarene Essex, MD;  Location: [facility] ENDOSCOPY;  Service: Endoscopy;  Laterality: N/A;   ESOPHAGOGASTRODUODENOSCOPY N/A 10/29/2015   Procedure: ESOPHAGOGASTRODUODENOSCOPY (EGD);  Surgeon: Ronald Lobo, MD;  Location: [facility] ENDOSCOPY;  Service: Endoscopy;  Laterality: N/A;   ESOPHAGOGASTRODUODENOSCOPY N/A  10/30/2015   Procedure: ESOPHAGOGASTRODUODENOSCOPY (EGD);  Surgeon: Clarene Essex, MD;  Location: [facility] ENDOSCOPY;  Service: Endoscopy;  Laterality: N/A;   g tube discontinued  04/2020   per patient   [place]  11/21/2015   REDUCTION OF HIATAL HERNIA , REPAIR HIATAL HERNIA, RESECTION SMALL BOWEL WITH ANASTOMOSIS, PLACEMENT GASTROSTOMY TUBE, PLACEMENT DUODENOSTOMY TUBE (N/A)   HEMORRHOID BANDING  X 2   HERNIA REPAIR     HIATAL HERNIA REPAIR N/A 11/21/2015   Procedure: REDUCTION OF HIATAL HERNIA , REPAIR HIATAL HERNIA, RESECTION SMALL BOWEL WITH ANASTOMOSIS, PLACEMENT GASTROSTOMY TUBE, PLACEMENT DUODENOSTOMY TUBE;  Surgeon: Mickeal Skinner, MD;  Location: [place];  Service: General;  Laterality: N/A;   HOLMIUM LASER APPLICATION Right 57/84/6962   Procedure: HOLMIUM LASER APPLICATION;  Surgeon: Remi Haggard, MD;  Location: [facility];  Service: Urology;  Laterality: Right;   INCISIONAL HERNIA REPAIR  06/28/2016   open/notes 07/15/2016   INCISIONAL HERNIA REPAIR  03/17/2018   WITH MESH   INCISIONAL HERNIA REPAIR N/A 03/17/2018   Procedure: INCISIONAL HERNIA REPAIR WITH MESH;  Surgeon: [name], MD;  Location: [place];  Service: General;  Laterality: N/A;   [place] N/A 10/16/2020   Procedure: Fatima Blank HERNIA REPAIR WITH MESH;  Surgeon: [name], MD;  Location: [place];  Service: General;  Laterality: N/A;   INGUINAL HERNIA REPAIR Bilateral  09/28/2018   INGUINAL HERNIA REPAIR Bilateral 09/28/2018   Procedure: BILATERAL OPEN INGUINAL HERNIA REPAIR WITH MESH;  Surgeon: [name], MD;  Location: [place];  Service: General;  Laterality: Bilateral;  GENERAL AND TAP BLOCK   INSERTION OF MESH N/A 03/17/2018   Procedure: INSERTION OF MESH;  Surgeon: [name], MD;  Location: [place];  Service: General;  Laterality: N/A;   INSERTION OF MESH Bilateral 09/28/2018   Procedure: Insertion Of Mesh;  Surgeon: [name], MD;  Location: [place];  Service:  General;  Laterality: Bilateral;   IR CM INJ ANY COLONIC TUBE W/FLUORO  02/04/2017   IR GUIDED DRAIN W CATHETER PLACEMENT  07/06/2016   /NOTES 07/15/2016   IR PATIENT EVAL TECH 0-60 MINS  06/28/2019   IR RADIOLOGIST EVAL & MGMT  07/27/2016   IR RADIOLOGIST EVAL & MGMT  08/17/2016   IR RADIOLOGIST EVAL & MGMT  08/26/2016   IR REPLACE G-TUBE SIMPLE WO FLUORO  07/28/2017   IR REPLACE G-TUBE SIMPLE WO FLUORO  01/31/2018   IR REPLACE G-TUBE SIMPLE WO FLUORO  10/16/2018   IR REPLACE G-TUBE SIMPLE WO FLUORO  03/05/2019   IR REPLACE G-TUBE SIMPLE WO FLUORO  08/22/2019   IR REPLACE G-TUBE SIMPLE WO FLUORO  01/08/2020   IR [place] GASTRO/COLONIC TUBE PERCUT W/FLUORO  08/04/2016   IR [place] GASTRO/COLONIC TUBE PERCUT W/FLUORO  01/14/2017   IR US GUIDE BX ASP/DRAIN  08/04/2016   KNEE CARTILAGE SURGERY Right 1971   "opened me up"   LAPAROTOMY N/A 07/05/2015   Procedure: PARTIAL SIGMOID COLECTOMY AND COLOSTOMY;  Surgeon: [name], MD;  Location: [place] OR;  Service: General;  Laterality: N/A;   MELANOMA EXCISION  2001   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  10/30/2015   Procedure: REMOVAL OF DISTAL STOMACH;  Surgeon: Judeth Horn, MD;  Location: [place];  Service: General;;   REPAIR OF PERFORATED ULCER N/A 10/30/2015   Procedure: REPAIR OF BLEEDING  ULCER;  Surgeon: Judeth Horn, MD;  Location: [place] OR;  Service: General;  Laterality: N/A;   TUMOR EXCISION  2009   "back; fatty tumor"   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   Bilateral inguinal hernia 09/28/2018   Incisional hernia 03/17/2018   Trigger thumb, left thumb 01/19/2018   Trigger finger, left index finger 07/18/2017   Trigger finger, right ring finger 01/31/2017   Trigger finger of left thumb 01/03/2017   Trigger finger of right thumb 01/03/2017   Trigger index finger of left hand 01/03/2017   Trigger index finger of right hand 01/03/2017    Malnutrition of moderate degree 07/19/2016   Intra-abdominal abscess ([place]) 07/16/2016   Cellulitis 07/15/2016   S/P colostomy takedown 06/28/2016   Pain in thoracic spine 06/09/2016   Mid back pain 06/09/2016   Postoperative fever 11/19/2015   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   Severe protein-calorie malnutrition ([place]) 11/17/2015   Sepsis ([place]) 11/14/2015   Hiccups 11/14/2015   AKI (acute kidney injury) ([place]) 11/14/2015   Fever    Leg swelling    Left shoulder pain    Muscle spasm of left shoulder    GI bleed 10/27/2015   Acute blood loss anemia 10/27/2015   Syncope 10/27/2015   Hyperglycemia 10/27/2015   Hypotension 10/27/2015   Neck pain 10/27/2015   Hematemesis 10/27/2015   Hematochezia 10/27/2015   Diverticulitis of colon with perforation 07/05/2015  PCP: Aurea Graff. Marlou Sa MD  REFERRING PROVIDER: Lorine Bears, NP  REFERRING DIAG: [phone] (ICD-10-CM) - Chronic left-sided low back pain without sciatica M54.6 (ICD-10-CM) - Pain in thoracic spine M79.18 (ICD-10-CM) - Myofascial pain syndrome G89.4 (ICD-10-CM) - Chronic pain syndrome  Rationale for Evaluation and Treatment Rehabilitation  THERAPY DIAG:  Other low back pain  Pain in thoracic spine  Abnormal posture  ONSET DATE: Chronic (years)  SUBJECTIVE:                                                                                                                                                                                           SUBJECTIVE STATEMENT: Pt indicated history of back/stomach complaints.  Pt indicated complaints primarily on Lt side of back into buttock.  Pt indicated discussion about possible injection for pain with MD office but not planned at this time.  Pt indicated complaints noted during nighttime with various lying down positioning.   Pt indicated he has seen physical therapy earlier this year for symptoms with improvements noted at times.   Pt indicated some improvement with  needling use in clinic.    PERTINENT HISTORY:  History of skilled PT services until June 2023.  History of trigger point injections and epidural injection.   Had ED visit (not seen) on 11/05/2021 for abdominal pain as well.   Hyperlipidemia, HTN, GERD in medical history.  History of several abdominal surgeries.   PAIN:  NPRS scale: 5/10 Pain location: back pain (thoracic/lumbar)  Pain description: nagging ache Aggravating factors: unsure of activity that consistently worsen.  Relieving factors: rest, medicine   PRECAUTIONS: None  WEIGHT BEARING RESTRICTIONS No  FALLS:  Has patient fallen in last 6 months? No  LIVING ENVIRONMENT: Lives with: lives with their family Lives in: House/apartment Stairs:1 step to enter front, back steps 4-5.   OCCUPATION: Retired  PLOF: Independent, walking dog, general household activity, fishing  PATIENT GOALS have less pain   OBJECTIVE:   Imaging:  11/16/2021:  Thoracic MRI imaging from February exhibits minimal degenerative changes, no nerve impingement, no stenosis. Lumbar MRI imaging from 2022 exhibits multilevel facet arthropathy and broad-based disc protrusion contributing to mild foraminal narrowing bilaterally, left greater than right. No high grade spinal canal stenosis  PATIENT SURVEYS:  11/16/2021 FOTO intake:  49  predicted:  56  SCREENING FOR RED FLAGS: 11/16/2021 Bowel or bladder incontinence: No Cauda equina syndrome: No  COGNITION: 11/16/2021  Overall cognitive status: WFL normal    SENSATION: 11/16/2021 [facility]  MUSCLE LENGTH: 11/16/2021  None checked today  POSTURE:  11/16/2021 Lt shoulder higher than Rt in sitting/standing, Mild Lt lateral shift of  trunk, increased thoracic kyphosis, reduced lumbar lordosis in standing.   PALPATION: 11/16/2021 Lumbar paraspinal tenderness bilateral  Lt > Rt.  Lt QL tenderness  Back/ LUMBAR ROM:   AROM  AROM  11/16/2021  Lumbar Flexion To mid shin no complaints.   Lumbar Extension  < 25 % c ERP  Lumbar Right lateral flexion To mid thigh c pain  Lumbar Left lateral flexion To mid thigh c pain     Thoracic extension Unable to reach neutral   Thoracic flexion < 25 %  Lt thoracic rotation 35  Rt thoracic rotation 40   (Blank rows = not tested)  LOWER EXTREMITY ROM:      Right 11/16/2021 Left 11/16/2021  Hip flexion    Hip extension    Hip abduction    Hip adduction    Hip internal rotation    Hip external rotation    Knee flexion    Knee extension    Ankle dorsiflexion    Ankle plantarflexion    Ankle inversion    Ankle eversion     (Blank rows = not tested)  LOWER EXTREMITY MMT:    MMT Right 11/16/2021 Left 11/16/2021  Hip flexion    Hip extension    Hip abduction    Hip adduction    Hip internal rotation    Hip external rotation    Knee flexion    Knee extension    Ankle dorsiflexion    Ankle plantarflexion             (Blank rows = not tested) 11/16/2021:  General postural strength deficits as observed by inability to hold postural corrections.   LUMBAR SPECIAL TESTS:  11/16/2021 (-) slump bilateral.  General PAIVM hypomobility noted throughout  FUNCTIONAL TESTS:  11/16/2021 None performed today  GAIT: 11/16/2021 Independent  TODAY'S TREATMENT  11/16/2021 Therex:    HEP instruction/performance c cues for techniques, handout provided.  Trial set performed of each for comprehension and symptom assessment.  See below for exercise list    PATIENT EDUCATION:  Education details: HEP, POC Person educated: Patient Education method: Explanation, Demonstration, Verbal cues, and Handouts Education comprehension: verbalized understanding, returned demonstration, and verbal cues required   HOME EXERCISE PROGRAM: Access Code: [email or web address] URL: [email or web address] Date: 11/16/2021 Prepared by: Scot Jun  Exercises - Shoulder External Rotation and Scapular Retraction  - 2-3 x daily - 7 x weekly - 1 sets - 10 reps - 2  hold - Seated Thoracic Lumbar Extension  - 2-3 x daily - 7 x weekly - 1 sets - 10 reps - 3-5 hold - Supine Lower Trunk Rotation  - 2-3 x daily - 7 x weekly - 1 sets - 3-5 reps - 15 hold - Standing Lumbar Extension  - 2-3 x daily - 7 x weekly - 1 sets - 5-10 reps - Seated Multifidi Isometric  - 2-3 x daily - 7 x weekly - 1 sets - 10 reps - 5-10 hold  ASSESSMENT:  CLINICAL IMPRESSION: Patient is a 71 y.o. who comes to clinic with complaints of thoracic/low back pain with mobility, strength and movement coordination deficits that impair their ability to perform usual daily and recreational functional activities without increase difficulty/symptoms at this time.  Patient to benefit from skilled PT services to address impairments and limitations to improve to previous level of function without restriction secondary to condition.    OBJECTIVE IMPAIRMENTS Abnormal gait, decreased activity tolerance, decreased endurance, decreased mobility, difficulty walking, decreased ROM, decreased strength, increased fascial restrictions,  impaired perceived functional ability, increased muscle spasms, impaired flexibility, improper body mechanics, postural dysfunction, and pain.   ACTIVITY LIMITATIONS carrying, lifting, bending, standing, sleeping, and locomotion level  PARTICIPATION LIMITATIONS: cleaning, interpersonal relationship, community activity, yard work, and housework  PERSONAL FACTORS  Hyperlipidemia, HTN, GERD, time since onset of symptoms  are also affecting patient's functional outcome.   REHAB POTENTIAL: Good  CLINICAL DECISION MAKING: Stable/uncomplicated  EVALUATION COMPLEXITY: Low   GOALS: Goals reviewed with patient? Yes  Short term PT Goals (target date for Short term goals are 3 weeks 12/07/2021) Patient will demonstrate independent use of home exercise program to maintain progress from in clinic treatments. Goal status: New   Long term PT goals (target dates for all long term goals  are 10 weeks  01/25/2022 )   1. Patient will demonstrate/report pain at worst less than or equal to 2/10 to facilitate minimal limitation in daily activity secondary to pain symptoms. Goal status: New   2. Patient will demonstrate independent use of home exercise program to facilitate ability to maintain/progress functional gains from skilled physical therapy services. Goal status: New   3. Patient will demonstrate FOTO outcome > or = 56 % to indicate reduced disability due to condition. Goal status: New   4. Patient will demonstrate lumbar extension > or = 75 % WFL s symptoms to facilitate upright standing, walking posture at PLOF s limitation. Goal status: New   5.  Patient will demonstrate thoracic extension 25% WFL to facilitate upright standing/sitting posture.    Goal status: New   6.  Patient will demonstrate/report ability to perform usual walking, sleeping at PLOF.   Goal status: New       PLAN: PT FREQUENCY: 1-2x/week  PT DURATION: 10 weeks  PLANNED INTERVENTIONS: Therapeutic exercises, Therapeutic activity, Neuro Muscular re-education, Balance training, Gait training, Patient/Family education, Joint mobilization, Stair training, DME instructions, Dry Needling, Electrical stimulation, Cryotherapy, Moist heat, Taping, Ultrasound, Ionotophoresis '4mg'$ /ml Dexamethasone, and Manual therapy.  All included unless contraindicated   PLAN FOR NEXT SESSION: Review HEP knowledge/results.   Dry needling as desired, check leg length, LE strength.  Manual therapy for thoracic/lumbar mobility gains.     Scot Jun, PT, DPT, OCS, ATC 11/16/21  12:33 PM

## 2021-11-19 ENCOUNTER — Ambulatory Visit (INDEPENDENT_AMBULATORY_CARE_PROVIDER_SITE_OTHER): Payer: Medicare Other | Admitting: Rehabilitative and Restorative Service Providers"

## 2021-11-19 ENCOUNTER — Encounter: Payer: Self-pay | Admitting: Rehabilitative and Restorative Service Providers"

## 2021-11-19 DIAGNOSIS — R293 Abnormal posture: Secondary | ICD-10-CM

## 2021-11-19 DIAGNOSIS — M546 Pain in thoracic spine: Secondary | ICD-10-CM | POA: Diagnosis not present

## 2021-11-19 DIAGNOSIS — M5459 Other low back pain: Secondary | ICD-10-CM

## 2021-11-19 NOTE — Therapy (Signed)
OUTPATIENT PHYSICAL THERAPY TREATMENT   Patient Name: ARLAN BIRKS MRN: [id number] DOB:1950/09/14, 71 y.o., male Today's Date: 11/19/2021  PCP: Alroy Dust L. Dean MD  REFERRING PROVIDER: Lorine Bears, NP  END OF SESSION:   PT End of Session - 11/19/21 1015     Visit Number 2    Number of Visits 20    Date for PT Re-Evaluation 01/25/22    Progress Note Due on Visit 10    PT Start Time 1443    PT Stop Time 1055    PT Time Calculation (min) 40 min    Activity Tolerance Patient tolerated treatment well    Behavior During Therapy WFL for tasks assessed/performed              Past Medical History:  Diagnosis Date   Anemia    Anxiety    BPH (benign prostatic hyperplasia)    Gastritis    GERD (gastroesophageal reflux disease)    "seldom" (07/16/2016)   GI bleed due to NSAIDs 10/27/2015   History of blood transfusion 06/2016   post OR/notes 07/15/2016   History of hiatal hernia    History of kidney stones    surgery to remove stone   Hyperlipidemia    Hypertension    no meds    Melanoma of back ([place])    "mid back"   Sigmoid diverticulitis    with perforation   Past Surgical History:  Procedure Laterality Date   COLON SURGERY     sigmoid   COLOSTOMY TAKEDOWN N/A 06/28/2016   Procedure: COLOSTOMY TAKEDOWN;  Surgeon: [name], MD;  Location: [facility];  Service: General;  Laterality: N/A;   CYSTOSCOPY WITH RETROGRADE PYELOGRAM, URETEROSCOPY AND STENT PLACEMENT Bilateral 02/12/2020   Procedure: CYSTOSCOPY WITH BILATERAL RETROGRADE PYELOGRAM,  AND LITHOPEXY;  Surgeon: Remi Haggard, MD;  Location: [facility];  Service: Urology;  Laterality: Bilateral;  1 HR   ESOPHAGOGASTRODUODENOSCOPY N/A 10/27/2015   Procedure: ESOPHAGOGASTRODUODENOSCOPY (EGD);  Surgeon: Clarene Essex, MD;  Location: [facility] ENDOSCOPY;  Service: Endoscopy;  Laterality: N/A;   ESOPHAGOGASTRODUODENOSCOPY N/A 10/29/2015   Procedure: ESOPHAGOGASTRODUODENOSCOPY (EGD);  Surgeon: Ronald Lobo, MD;   Location: [facility] ENDOSCOPY;  Service: Endoscopy;  Laterality: N/A;   ESOPHAGOGASTRODUODENOSCOPY N/A 10/30/2015   Procedure: ESOPHAGOGASTRODUODENOSCOPY (EGD);  Surgeon: Clarene Essex, MD;  Location: [facility] ENDOSCOPY;  Service: Endoscopy;  Laterality: N/A;   g tube discontinued  04/2020   per patient   [place]  11/21/2015   REDUCTION OF HIATAL HERNIA , REPAIR HIATAL HERNIA, RESECTION SMALL BOWEL WITH ANASTOMOSIS, PLACEMENT GASTROSTOMY TUBE, PLACEMENT DUODENOSTOMY TUBE (N/A)   HEMORRHOID BANDING  X 2   HERNIA REPAIR     HIATAL HERNIA REPAIR N/A 11/21/2015   Procedure: REDUCTION OF HIATAL HERNIA , REPAIR HIATAL HERNIA, RESECTION SMALL BOWEL WITH ANASTOMOSIS, PLACEMENT GASTROSTOMY TUBE, PLACEMENT DUODENOSTOMY TUBE;  Surgeon: Mickeal Skinner, MD;  Location: [place];  Service: General;  Laterality: N/A;   HOLMIUM LASER APPLICATION Right 15/40/0867   Procedure: HOLMIUM LASER APPLICATION;  Surgeon: Remi Haggard, MD;  Location: [facility];  Service: Urology;  Laterality: Right;   INCISIONAL HERNIA REPAIR  06/28/2016   open/notes 07/15/2016   INCISIONAL HERNIA REPAIR  03/17/2018   WITH MESH   INCISIONAL HERNIA REPAIR N/A 03/17/2018   Procedure: INCISIONAL HERNIA REPAIR WITH MESH;  Surgeon: [name], MD;  Location: [place];  Service: General;  Laterality: N/A;   [place] N/A 10/16/2020   Procedure: Fatima Blank HERNIA REPAIR WITH MESH;  Surgeon: Ninfa Linden,  Nathaneil Canary, MD;  Location: [place];  Service: General;  Laterality: N/A;   INGUINAL HERNIA REPAIR Bilateral 09/28/2018   INGUINAL HERNIA REPAIR Bilateral 09/28/2018   Procedure: BILATERAL OPEN INGUINAL HERNIA REPAIR WITH MESH;  Surgeon: [name], MD;  Location: [place];  Service: General;  Laterality: Bilateral;  GENERAL AND TAP BLOCK   INSERTION OF MESH N/A 03/17/2018   Procedure: INSERTION OF MESH;  Surgeon: [name], MD;  Location: [place];  Service: General;  Laterality: N/A;   INSERTION OF MESH Bilateral  09/28/2018   Procedure: Insertion Of Mesh;  Surgeon: [name], MD;  Location: [place];  Service: General;  Laterality: Bilateral;   IR CM INJ ANY COLONIC TUBE W/FLUORO  02/04/2017   IR GUIDED DRAIN W CATHETER PLACEMENT  07/06/2016   /NOTES 07/15/2016   IR PATIENT EVAL TECH 0-60 MINS  06/28/2019   IR RADIOLOGIST EVAL & MGMT  07/27/2016   IR RADIOLOGIST EVAL & MGMT  08/17/2016   IR RADIOLOGIST EVAL & MGMT  08/26/2016   IR REPLACE G-TUBE SIMPLE WO FLUORO  07/28/2017   IR REPLACE G-TUBE SIMPLE WO FLUORO  01/31/2018   IR REPLACE G-TUBE SIMPLE WO FLUORO  10/16/2018   IR REPLACE G-TUBE SIMPLE WO FLUORO  03/05/2019   IR REPLACE G-TUBE SIMPLE WO FLUORO  08/22/2019   IR REPLACE G-TUBE SIMPLE WO FLUORO  01/08/2020   IR [place] GASTRO/COLONIC TUBE PERCUT W/FLUORO  08/04/2016   IR [place] GASTRO/COLONIC TUBE PERCUT W/FLUORO  01/14/2017   IR US GUIDE BX ASP/DRAIN  08/04/2016   KNEE CARTILAGE SURGERY Right 1971   "opened me up"   LAPAROTOMY N/A 07/05/2015   Procedure: PARTIAL SIGMOID COLECTOMY AND COLOSTOMY;  Surgeon: [name], MD;  Location: [facility] OR;  Service: General;  Laterality: N/A;   MELANOMA EXCISION  2001   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  10/30/2015   Procedure: REMOVAL OF DISTAL STOMACH;  Surgeon: Judeth Horn, MD;  Location: [place];  Service: General;;   REPAIR OF PERFORATED ULCER N/A 10/30/2015   Procedure: REPAIR OF BLEEDING  ULCER;  Surgeon: Judeth Horn, MD;  Location: [facility] OR;  Service: General;  Laterality: N/A;   TUMOR EXCISION  2009   "back; fatty tumor"   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   Bilateral inguinal hernia 09/28/2018   Incisional hernia 03/17/2018   Trigger thumb, left thumb 01/19/2018   Trigger finger, left index finger 07/18/2017   Trigger finger, right ring finger 01/31/2017   Trigger finger of left thumb 01/03/2017   Trigger finger of right thumb 01/03/2017    Trigger index finger of left hand 01/03/2017   Trigger index finger of right hand 01/03/2017   Malnutrition of moderate degree 07/19/2016   Intra-abdominal abscess ([place]) 07/16/2016   Cellulitis 07/15/2016   S/P colostomy takedown 06/28/2016   Pain in thoracic spine 06/09/2016   Mid back pain 06/09/2016   Postoperative fever 11/19/2015   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   Severe protein-calorie malnutrition ([place]) 11/17/2015   Sepsis ([place]) 11/14/2015   Hiccups 11/14/2015   AKI (acute kidney injury) ([place]) 11/14/2015   Fever    Leg swelling    Left shoulder pain    Muscle spasm of left shoulder    GI bleed 10/27/2015   Acute blood loss anemia 10/27/2015   Syncope 10/27/2015   Hyperglycemia 10/27/2015   Hypotension 10/27/2015   Neck pain  10/27/2015   Hematemesis 10/27/2015   Hematochezia 10/27/2015   Diverticulitis of colon with perforation 07/05/2015    REFERRING DIAG: M54.50,G89.29 (ICD-10-CM) - Chronic left-sided low back pain without sciatica M54.6 (ICD-10-CM) - Pain in thoracic spine M79.18 (ICD-10-CM) - Myofascial pain syndrome G89.4 (ICD-10-CM) - Chronic pain syndrome  Rationale for Evaluation and Treatment Rehabilitation  THERAPY DIAG:  Other low back pain  Pain in thoracic spine  Abnormal posture  ONSET DATE: Chronic (years)  SUBJECTIVE:                                                                                                                                                                                           SUBJECTIVE STATEMENT: Pt indicated not feeling too bad today.  Pt indicated he has been more active in last few days doing some yard work.  Pt indicated feeling better so that helped he want to do more things.    PERTINENT HISTORY:  History of skilled PT services until June 2023.  History of trigger point injections and epidural injection.   Had ED visit (not seen) on 11/05/2021 for abdominal pain as well.   Hyperlipidemia, HTN, GERD in  medical history.  History of several abdominal surgeries.   PAIN:  NPRS scale: upon arrival 3-4/10.  Pain location: back pain (thoracic/lumbar)  Pain description: nagging ache Aggravating factors: unsure of activity that consistently worsen.  Relieving factors: rest, medicine   PRECAUTIONS: None  WEIGHT BEARING RESTRICTIONS No  FALLS:  Has patient fallen in last 6 months? No  LIVING ENVIRONMENT: Lives with: lives with their family Lives in: House/apartment Stairs:1 step to enter front, back steps 4-5.   OCCUPATION: Retired  PLOF: Independent, walking dog, general household activity, fishing  PATIENT GOALS have less pain   OBJECTIVE:   Imaging:  11/16/2021:  Thoracic MRI imaging from February exhibits minimal degenerative changes, no nerve impingement, no stenosis. Lumbar MRI imaging from 2022 exhibits multilevel facet arthropathy and broad-based disc protrusion contributing to mild foraminal narrowing bilaterally, left greater than right. No high grade spinal canal stenosis  PATIENT SURVEYS:  11/16/2021 FOTO intake:  49  predicted:  56  SCREENING FOR RED FLAGS: 11/16/2021 Bowel or bladder incontinence: No Cauda equina syndrome: No  COGNITION: 11/16/2021  Overall cognitive status: WFL normal    SENSATION: 11/16/2021 [facility]  MUSCLE LENGTH: 11/16/2021  None checked today  POSTURE:  11/16/2021 Lt shoulder higher than Rt in sitting/standing, Mild Lt lateral shift of trunk, increased thoracic kyphosis, reduced lumbar lordosis in standing.   PALPATION: 11/16/2021 Lumbar paraspinal tenderness bilateral  Lt > Rt.  Lt QL tenderness  Back/ LUMBAR ROM:  AROM  AROM  11/16/2021  Lumbar Flexion To mid shin no complaints.   Lumbar Extension < 25 % c ERP  Lumbar Right lateral flexion To mid thigh c pain  Lumbar Left lateral flexion To mid thigh c pain     Thoracic extension Unable to reach neutral   Thoracic flexion < 25 %  Lt thoracic rotation 35  Rt thoracic rotation 40    (Blank rows = not tested)  LOWER EXTREMITY ROM:      Right 11/16/2021 Left 11/16/2021  Hip flexion    Hip extension    Hip abduction    Hip adduction    Hip internal rotation    Hip external rotation    Knee flexion    Knee extension    Ankle dorsiflexion    Ankle plantarflexion    Ankle inversion    Ankle eversion     (Blank rows = not tested)  LOWER EXTREMITY MMT:    MMT Right 11/16/2021 Left 11/16/2021  Hip flexion    Hip extension    Hip abduction    Hip adduction    Hip internal rotation    Hip external rotation    Knee flexion    Knee extension    Ankle dorsiflexion    Ankle plantarflexion             (Blank rows = not tested) 11/16/2021:  General postural strength deficits as observed by inability to hold postural corrections.   LUMBAR SPECIAL TESTS:  11/16/2021 (-) slump bilateral.  General PAIVM hypomobility noted throughout  FUNCTIONAL TESTS:  11/16/2021 None performed today  GAIT: 11/16/2021 Independent  TODAY'S TREATMENT  11/19/2021: Therex: Supine lumbar trunk rotation stretch 15 sec x 5 bilateral Supine bridge 3 sec hold x 15 Seated thoracic extension in chair 2-3 sec hold x 15 Seated cervical retraction into clinician hand isometric hold 5 sec x 10 Seated bilateral scapular retraction c bilateral UE ER 2-3 sec hold x 10  Standing thoracic rotation at wall x 10 bilateral (added to home - no picture available)  UBE UE/LE fwd lvl 3.0 5 mins, UE only backward 3 mins lvl 2.0   Manual:   cPA g3 T4-L2 throughout back for mobility gains.   11/16/2021 Therex:    HEP instruction/performance c cues for techniques, handout provided.  Trial set performed of each for comprehension and symptom assessment.  See below for exercise list    PATIENT EDUCATION:  Education details: HEP, POC Person educated: Patient Education method: Explanation, Demonstration, Verbal cues, and Handouts Education comprehension: verbalized understanding, returned  demonstration, and verbal cues required   HOME EXERCISE PROGRAM: Access Code: [email or web address] URL: [email or web address] Date: 11/16/2021 Prepared by: Scot Jun  Exercises - Shoulder External Rotation and Scapular Retraction  - 2-3 x daily - 7 x weekly - 1 sets - 10 reps - 2 hold - Seated Thoracic Lumbar Extension  - 2-3 x daily - 7 x weekly - 1 sets - 10 reps - 3-5 hold - Supine Lower Trunk Rotation  - 2-3 x daily - 7 x weekly - 1 sets - 3-5 reps - 15 hold - Standing Lumbar Extension  - 2-3 x daily - 7 x weekly - 1 sets - 5-10 reps - Seated Multifidi Isometric  - 2-3 x daily - 7 x weekly - 1 sets - 10 reps - 5-10 hold  ASSESSMENT:  CLINICAL IMPRESSION: General spinal hypomobility still noted and impacting functional movement ability at this time.  Incorporation of manual intervention to  promote improved mobility in thoracic/lumbar region today c good overall tolerance.  Fair to good knowledge in HEP review.  Continued skilled PT services indicated at this time.    OBJECTIVE IMPAIRMENTS Abnormal gait, decreased activity tolerance, decreased endurance, decreased mobility, difficulty walking, decreased ROM, decreased strength, increased fascial restrictions, impaired perceived functional ability, increased muscle spasms, impaired flexibility, improper body mechanics, postural dysfunction, and pain.   ACTIVITY LIMITATIONS carrying, lifting, bending, standing, sleeping, and locomotion level  PARTICIPATION LIMITATIONS: cleaning, interpersonal relationship, community activity, yard work, and housework  PERSONAL FACTORS  Hyperlipidemia, HTN, GERD, time since onset of symptoms  are also affecting patient's functional outcome.   REHAB POTENTIAL: Good  CLINICAL DECISION MAKING: Stable/uncomplicated  EVALUATION COMPLEXITY: Low   GOALS: Goals reviewed with patient? Yes  Short term PT Goals (target date for Short term goals are 3 weeks 12/07/2021) Patient will demonstrate  independent use of home exercise program to maintain progress from in clinic treatments. Goal status: on going - assessed 11/19/2021   Long term PT goals (target dates for all long term goals are 10 weeks  01/25/2022 )   1. Patient will demonstrate/report pain at worst less than or equal to 2/10 to facilitate minimal limitation in daily activity secondary to pain symptoms. Goal status: New   2. Patient will demonstrate independent use of home exercise program to facilitate ability to maintain/progress functional gains from skilled physical therapy services. Goal status: New   3. Patient will demonstrate FOTO outcome > or = 56 % to indicate reduced disability due to condition. Goal status: New   4. Patient will demonstrate lumbar extension > or = 75 % WFL s symptoms to facilitate upright standing, walking posture at PLOF s limitation. Goal status: New   5.  Patient will demonstrate thoracic extension 25% WFL to facilitate upright standing/sitting posture.    Goal status: New   6.  Patient will demonstrate/report ability to perform usual walking, sleeping at PLOF.   Goal status: New       PLAN: PT FREQUENCY: 1-2x/week  PT DURATION: 10 weeks  PLANNED INTERVENTIONS: Therapeutic exercises, Therapeutic activity, Neuro Muscular re-education, Balance training, Gait training, Patient/Family education, Joint mobilization, Stair training, DME instructions, Dry Needling, Electrical stimulation, Cryotherapy, Moist heat, Taping, Ultrasound, Ionotophoresis '4mg'$ /ml Dexamethasone, and Manual therapy.  All included unless contraindicated   PLAN FOR NEXT SESSION: check leg length.  Manual therapy for thoracic/lumbar mobility gains with progressive therex    Scot Jun, PT, DPT, OCS, ATC 11/19/21  11:00 AM

## 2021-11-24 ENCOUNTER — Ambulatory Visit (INDEPENDENT_AMBULATORY_CARE_PROVIDER_SITE_OTHER): Payer: Medicare Other | Admitting: Rehabilitative and Restorative Service Providers"

## 2021-11-24 ENCOUNTER — Encounter: Payer: Self-pay | Admitting: Rehabilitative and Restorative Service Providers"

## 2021-11-24 DIAGNOSIS — M546 Pain in thoracic spine: Secondary | ICD-10-CM | POA: Diagnosis not present

## 2021-11-24 DIAGNOSIS — M5459 Other low back pain: Secondary | ICD-10-CM

## 2021-11-24 DIAGNOSIS — R293 Abnormal posture: Secondary | ICD-10-CM | POA: Diagnosis not present

## 2021-11-24 NOTE — Therapy (Signed)
OUTPATIENT PHYSICAL THERAPY TREATMENT   Patient Name: Miguel [name] MRN: [id number] DOB:10/28/1950, 71 y.o., male Today's Date: 11/24/2021  PCP: Alroy Dust L. Dean MD  REFERRING PROVIDER: Lorine Bears, NP  END OF SESSION:   PT End of Session - 11/24/21 1024     Visit Number 3    Number of Visits 20    Date for PT Re-Evaluation 01/25/22    Progress Note Due on Visit 10    PT Start Time 1017    PT Stop Time 1056    PT Time Calculation (min) 39 min    Activity Tolerance Patient tolerated treatment well    Behavior During Therapy WFL for tasks assessed/performed               Past Medical History:  Diagnosis Date   Anemia    Anxiety    BPH (benign prostatic hyperplasia)    Gastritis    GERD (gastroesophageal reflux disease)    "seldom" (07/16/2016)   GI bleed due to NSAIDs 10/27/2015   History of blood transfusion 06/2016   post OR/notes 07/15/2016   History of hiatal hernia    History of kidney stones    surgery to remove stone   Hyperlipidemia    Hypertension    no meds    Melanoma of back ([place])    "mid back"   Sigmoid diverticulitis    with perforation   Past Surgical History:  Procedure Laterality Date   COLON SURGERY     sigmoid   COLOSTOMY TAKEDOWN N/A 06/28/2016   Procedure: COLOSTOMY TAKEDOWN;  Surgeon: [name], MD;  Location: [facility];  Service: General;  Laterality: N/A;   CYSTOSCOPY WITH RETROGRADE PYELOGRAM, URETEROSCOPY AND STENT PLACEMENT Bilateral 02/12/2020   Procedure: CYSTOSCOPY WITH BILATERAL RETROGRADE PYELOGRAM,  AND LITHOPEXY;  Surgeon: Remi Haggard, MD;  Location: [facility];  Service: Urology;  Laterality: Bilateral;  1 HR   ESOPHAGOGASTRODUODENOSCOPY N/A 10/27/2015   Procedure: ESOPHAGOGASTRODUODENOSCOPY (EGD);  Surgeon: Clarene Essex, MD;  Location: [facility] ENDOSCOPY;  Service: Endoscopy;  Laterality: N/A;   ESOPHAGOGASTRODUODENOSCOPY N/A 10/29/2015   Procedure: ESOPHAGOGASTRODUODENOSCOPY (EGD);  Surgeon: Ronald Lobo, MD;   Location: [facility] ENDOSCOPY;  Service: Endoscopy;  Laterality: N/A;   ESOPHAGOGASTRODUODENOSCOPY N/A 10/30/2015   Procedure: ESOPHAGOGASTRODUODENOSCOPY (EGD);  Surgeon: Clarene Essex, MD;  Location: [facility] ENDOSCOPY;  Service: Endoscopy;  Laterality: N/A;   g tube discontinued  04/2020   per patient   [place]  11/21/2015   REDUCTION OF HIATAL HERNIA , REPAIR HIATAL HERNIA, RESECTION SMALL BOWEL WITH ANASTOMOSIS, PLACEMENT GASTROSTOMY TUBE, PLACEMENT DUODENOSTOMY TUBE (N/A)   HEMORRHOID BANDING  X 2   HERNIA REPAIR     HIATAL HERNIA REPAIR N/A 11/21/2015   Procedure: REDUCTION OF HIATAL HERNIA , REPAIR HIATAL HERNIA, RESECTION SMALL BOWEL WITH ANASTOMOSIS, PLACEMENT GASTROSTOMY TUBE, PLACEMENT DUODENOSTOMY TUBE;  Surgeon: Mickeal Skinner, MD;  Location: [place];  Service: General;  Laterality: N/A;   HOLMIUM LASER APPLICATION Right 23/76/2831   Procedure: HOLMIUM LASER APPLICATION;  Surgeon: Remi Haggard, MD;  Location: [facility];  Service: Urology;  Laterality: Right;   INCISIONAL HERNIA REPAIR  06/28/2016   open/notes 07/15/2016   INCISIONAL HERNIA REPAIR  03/17/2018   WITH MESH   INCISIONAL HERNIA REPAIR N/A 03/17/2018   Procedure: INCISIONAL HERNIA REPAIR WITH MESH;  Surgeon: [name], MD;  Location: [place];  Service: General;  Laterality: N/A;   [place] N/A 10/16/2020   Procedure: INCISIONAL HERNIA REPAIR WITH MESH;  Surgeon:  [name], MD;  Location: [place];  Service: General;  Laterality: N/A;   INGUINAL HERNIA REPAIR Bilateral 09/28/2018   INGUINAL HERNIA REPAIR Bilateral 09/28/2018   Procedure: BILATERAL OPEN INGUINAL HERNIA REPAIR WITH MESH;  Surgeon: [name], MD;  Location: [place];  Service: General;  Laterality: Bilateral;  GENERAL AND TAP BLOCK   INSERTION OF MESH N/A 03/17/2018   Procedure: INSERTION OF MESH;  Surgeon: [name], MD;  Location: [facility];  Service: General;  Laterality: N/A;   INSERTION OF MESH Bilateral  09/28/2018   Procedure: Insertion Of Mesh;  Surgeon: [name], MD;  Location: [place];  Service: General;  Laterality: Bilateral;   IR CM INJ ANY COLONIC TUBE W/FLUORO  02/04/2017   IR GUIDED DRAIN W CATHETER PLACEMENT  07/06/2016   /NOTES 07/15/2016   IR PATIENT EVAL TECH 0-60 MINS  06/28/2019   IR RADIOLOGIST EVAL & MGMT  07/27/2016   IR RADIOLOGIST EVAL & MGMT  08/17/2016   IR RADIOLOGIST EVAL & MGMT  08/26/2016   IR REPLACE G-TUBE SIMPLE WO FLUORO  07/28/2017   IR REPLACE G-TUBE SIMPLE WO FLUORO  01/31/2018   IR REPLACE G-TUBE SIMPLE WO FLUORO  10/16/2018   IR REPLACE G-TUBE SIMPLE WO FLUORO  03/05/2019   IR REPLACE G-TUBE SIMPLE WO FLUORO  08/22/2019   IR REPLACE G-TUBE SIMPLE WO FLUORO  01/08/2020   IR [place] GASTRO/COLONIC TUBE PERCUT W/FLUORO  08/04/2016   IR [place] GASTRO/COLONIC TUBE PERCUT W/FLUORO  01/14/2017   IR US GUIDE BX ASP/DRAIN  08/04/2016   KNEE CARTILAGE SURGERY Right 1971   "opened me up"   LAPAROTOMY N/A 07/05/2015   Procedure: PARTIAL SIGMOID COLECTOMY AND COLOSTOMY;  Surgeon: [name], MD;  Location: [facility];  Service: General;  Laterality: N/A;   MELANOMA EXCISION  2001   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  10/30/2015   Procedure: REMOVAL OF DISTAL STOMACH;  Surgeon: Judeth Horn, MD;  Location: [place];  Service: General;;   REPAIR OF PERFORATED ULCER N/A 10/30/2015   Procedure: REPAIR OF BLEEDING  ULCER;  Surgeon: Judeth Horn, MD;  Location: [facility];  Service: General;  Laterality: N/A;   TUMOR EXCISION  2009   "back; fatty tumor"   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   Bilateral inguinal hernia 09/28/2018   Incisional hernia 03/17/2018   Trigger thumb, left thumb 01/19/2018   Trigger finger, left index finger 07/18/2017   Trigger finger, right ring finger 01/31/2017   Trigger finger of left thumb 01/03/2017   Trigger finger of right thumb 01/03/2017    Trigger index finger of left hand 01/03/2017   Trigger index finger of right hand 01/03/2017   Malnutrition of moderate degree 07/19/2016   Intra-abdominal abscess ([place]) 07/16/2016   Cellulitis 07/15/2016   S/P colostomy takedown 06/28/2016   Pain in thoracic spine 06/09/2016   Mid back pain 06/09/2016   Postoperative fever 11/19/2015   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   Severe protein-calorie malnutrition ([place]) 11/17/2015   Sepsis ([place]) 11/14/2015   Hiccups 11/14/2015   AKI (acute kidney injury) ([place]) 11/14/2015   Fever    Leg swelling    Left shoulder pain    Muscle spasm of left shoulder    GI bleed 10/27/2015   Acute blood loss anemia 10/27/2015   Syncope 10/27/2015   Hyperglycemia 10/27/2015   Hypotension 10/27/2015   Neck  pain 10/27/2015   Hematemesis 10/27/2015   Hematochezia 10/27/2015   Diverticulitis of colon with perforation 07/05/2015    REFERRING DIAG: M54.50,G89.29 (ICD-10-CM) - Chronic left-sided low back pain without sciatica M54.6 (ICD-10-CM) - Pain in thoracic spine M79.18 (ICD-10-CM) - Myofascial pain syndrome G89.4 (ICD-10-CM) - Chronic pain syndrome  Rationale for Evaluation and Treatment Rehabilitation  THERAPY DIAG:  Other low back pain  Pain in thoracic spine  Abnormal posture  ONSET DATE: Chronic (years)  SUBJECTIVE:                                                                                                                                                                                           SUBJECTIVE STATEMENT: Pt indicated no pain complaints specifically upon arrival.  Felt he is doing more.    PERTINENT HISTORY:  History of skilled PT services until June 2023.  History of trigger point injections and epidural injection.   Had ED visit (not seen) on 11/05/2021 for abdominal pain as well.   Hyperlipidemia, HTN, GERD in medical history.  History of several abdominal surgeries.   PAIN:  NPRS scale: 0/10  Pain location: back  pain (thoracic/lumbar)  Pain description: nagging ache Aggravating factors: unsure of activity that consistently worsen.  Relieving factors: rest, medicine   PRECAUTIONS: None  WEIGHT BEARING RESTRICTIONS No  FALLS:  Has patient fallen in last 6 months? No  LIVING ENVIRONMENT: Lives with: lives with their family Lives in: House/apartment Stairs:1 step to enter front, back steps 4-5.   OCCUPATION: Retired  PLOF: Independent, walking dog, general household activity, fishing  PATIENT GOALS have less pain   OBJECTIVE:   Imaging:  11/16/2021:  Thoracic MRI imaging from February exhibits minimal degenerative changes, no nerve impingement, no stenosis. Lumbar MRI imaging from 2022 exhibits multilevel facet arthropathy and broad-based disc protrusion contributing to mild foraminal narrowing bilaterally, left greater than right. No high grade spinal canal stenosis  PATIENT SURVEYS:  11/16/2021 FOTO intake:  49  predicted:  56  SCREENING FOR RED FLAGS: 11/16/2021 Bowel or bladder incontinence: No Cauda equina syndrome: No  COGNITION: 11/16/2021  Overall cognitive status: WFL normal    SENSATION: 11/16/2021 [facility]  MUSCLE LENGTH: 11/16/2021  None checked today  POSTURE:  11/16/2021 Lt shoulder higher than Rt in sitting/standing, Mild Lt lateral shift of trunk, increased thoracic kyphosis, reduced lumbar lordosis in standing.   PALPATION: 11/16/2021 Lumbar paraspinal tenderness bilateral  Lt > Rt.  Lt QL tenderness  Back/ LUMBAR ROM:   AROM  AROM  11/16/2021  Lumbar Flexion To mid shin no complaints.   Lumbar Extension < 25 % c ERP  Lumbar  Right lateral flexion To mid thigh c pain  Lumbar Left lateral flexion To mid thigh c pain     Thoracic extension Unable to reach neutral   Thoracic flexion < 25 %  Lt thoracic rotation 35  Rt thoracic rotation 40   (Blank rows = not tested)  LOWER EXTREMITY ROM:      Right 11/16/2021 Left 11/16/2021  Hip flexion    Hip extension     Hip abduction    Hip adduction    Hip internal rotation    Hip external rotation    Knee flexion    Knee extension    Ankle dorsiflexion    Ankle plantarflexion    Ankle inversion    Ankle eversion     (Blank rows = not tested)  LOWER EXTREMITY MMT:    MMT Right 11/16/2021 Left 11/16/2021  Hip flexion    Hip extension    Hip abduction    Hip adduction    Hip internal rotation    Hip external rotation    Knee flexion    Knee extension    Ankle dorsiflexion    Ankle plantarflexion             (Blank rows = not tested) 11/16/2021:  General postural strength deficits as observed by inability to hold postural corrections.   LUMBAR SPECIAL TESTS:  11/16/2021 (-) slump bilateral.  General PAIVM hypomobility noted throughout  FUNCTIONAL TESTS:  11/16/2021 None performed today  GAIT: 11/16/2021 Independent  TODAY'S TREATMENT  11/24/2021: Therex: Seated thoracic extension in chair 2-3 sec hold x 15 Seated cervical retraction into clinician hand isometric hold 5 sec x 10 Seated bilateral scapular retraction c bilateral UE ER 2-3 sec hold x 10  Standing thoracic rotation at wall x 10 bilateral  Seated multifidi lift on table 5 sec hold x 10 Seated ab press down on table 5 sec hold x 10   Standing tband rows green 2 x 15 Standing gh ext green band 2 x 15  UBE UE/LE fwd lvl 3.0 5 mins, UE only backward 4 mins lvl 2.0   11/19/2021: Therex: Supine lumbar trunk rotation stretch 15 sec x 5 bilateral Supine bridge 3 sec hold x 15 Seated thoracic extension in chair 2-3 sec hold x 15 Seated cervical retraction into clinician hand isometric hold 5 sec x 10 Seated bilateral scapular retraction c bilateral UE ER 2-3 sec hold x 10  Standing thoracic rotation at wall x 10 bilateral (added to home - no picture available)  UBE UE/LE fwd lvl 3.0 5 mins, UE only backward 3 mins lvl 2.0   Manual:   cPA g3 T4-L2 throughout back for mobility gains.    PATIENT EDUCATION:   Education details: HEP progression Person educated: Patient Education method: Consulting civil engineer, Demonstration, Verbal cues, and Handouts Education comprehension: verbalized understanding, returned demonstration, and verbal cues required   HOME EXERCISE PROGRAM: Access Code: [email or web address] URL: [email or web address] Date: 11/24/2021 Prepared by: Scot Jun  Exercises - Shoulder External Rotation and Scapular Retraction  - 2-3 x daily - 7 x weekly - 1 sets - 10 reps - 2 hold - Seated Thoracic Lumbar Extension  - 2-3 x daily - 7 x weekly - 1 sets - 10 reps - 3-5 hold - Supine Lower Trunk Rotation  - 2-3 x daily - 7 x weekly - 1 sets - 3-5 reps - 15 hold - Standing Lumbar Extension  - 2-3 x daily - 7 x weekly - 1 sets - 5-10 reps -  Seated Multifidi Isometric  - 2-3 x daily - 7 x weekly - 1 sets - 10 reps - 5-10 hold - Seated Abdominal Press into [employer]  - 2-3 x daily - 7 x weekly - 1 sets - 10 reps - 5-10 hold  ASSESSMENT:  CLINICAL IMPRESSION: Pt has reported less pain complaints each visit to this point.  Continued mobility deficits but showing good use of HEP at this time.  Plan to reassess mobility next visit and possible FOTO reassessment to check functional progress.    OBJECTIVE IMPAIRMENTS Abnormal gait, decreased activity tolerance, decreased endurance, decreased mobility, difficulty walking, decreased ROM, decreased strength, increased fascial restrictions, impaired perceived functional ability, increased muscle spasms, impaired flexibility, improper body mechanics, postural dysfunction, and pain.   ACTIVITY LIMITATIONS carrying, lifting, bending, standing, sleeping, and locomotion level  PARTICIPATION LIMITATIONS: cleaning, interpersonal relationship, community activity, yard work, and housework  PERSONAL FACTORS  Hyperlipidemia, HTN, GERD, time since onset of symptoms  are also affecting patient's functional outcome.   REHAB POTENTIAL: Good  CLINICAL DECISION  MAKING: Stable/uncomplicated  EVALUATION COMPLEXITY: Low   GOALS: Goals reviewed with patient? Yes  Short term PT Goals (target date for Short term goals are 3 weeks 12/07/2021) Patient will demonstrate independent use of home exercise program to maintain progress from in clinic treatments. Goal status: on going - assessed 11/19/2021   Long term PT goals (target dates for all long term goals are 10 weeks  01/25/2022 )   1. Patient will demonstrate/report pain at worst less than or equal to 2/10 to facilitate minimal limitation in daily activity secondary to pain symptoms. Goal status: New   2. Patient will demonstrate independent use of home exercise program to facilitate ability to maintain/progress functional gains from skilled physical therapy services. Goal status: New   3. Patient will demonstrate FOTO outcome > or = 56 % to indicate reduced disability due to condition. Goal status: New   4. Patient will demonstrate lumbar extension > or = 75 % WFL s symptoms to facilitate upright standing, walking posture at PLOF s limitation. Goal status: New   5.  Patient will demonstrate thoracic extension 25% WFL to facilitate upright standing/sitting posture.    Goal status: New   6.  Patient will demonstrate/report ability to perform usual walking, sleeping at PLOF.   Goal status: New       PLAN: PT FREQUENCY: 1-2x/week  PT DURATION: 10 weeks  PLANNED INTERVENTIONS: Therapeutic exercises, Therapeutic activity, Neuro Muscular re-education, Balance training, Gait training, Patient/Family education, Joint mobilization, Stair training, DME instructions, Dry Needling, Electrical stimulation, Cryotherapy, Moist heat, Taping, Ultrasound, Ionotophoresis '4mg'$ /ml Dexamethasone, and Manual therapy.  All included unless contraindicated   PLAN FOR NEXT SESSION:  FOTO reassessment/ROM check    Scot Jun, PT, DPT, OCS, ATC 11/24/21  11:00 AM

## 2021-11-26 ENCOUNTER — Encounter: Payer: Self-pay | Admitting: Rehabilitative and Restorative Service Providers"

## 2021-11-26 ENCOUNTER — Ambulatory Visit (INDEPENDENT_AMBULATORY_CARE_PROVIDER_SITE_OTHER): Payer: Medicare Other | Admitting: Rehabilitative and Restorative Service Providers"

## 2021-11-26 DIAGNOSIS — M546 Pain in thoracic spine: Secondary | ICD-10-CM | POA: Diagnosis not present

## 2021-11-26 DIAGNOSIS — M5459 Other low back pain: Secondary | ICD-10-CM

## 2021-11-26 DIAGNOSIS — R293 Abnormal posture: Secondary | ICD-10-CM | POA: Diagnosis not present

## 2021-11-26 NOTE — Therapy (Signed)
OUTPATIENT PHYSICAL THERAPY TREATMENT   Patient Name: Miguel [name] MRN: [id number] DOB:06-05-1950, 70 y.o., male Today's Date: 11/26/2021  PCP: Alroy Dust L. Dean MD  REFERRING PROVIDER: Lorine Bears, NP  END OF SESSION:   PT End of Session - 11/26/21 1037     Visit Number 4    Number of Visits 20    Date for PT Re-Evaluation 01/25/22    Progress Note Due on Visit 10    PT Start Time 1020    PT Stop Time 1100    PT Time Calculation (min) 40 min    Activity Tolerance Patient tolerated treatment well    Behavior During Therapy WFL for tasks assessed/performed             Past Medical History:  Diagnosis Date   Anemia    Anxiety    BPH (benign prostatic hyperplasia)    Gastritis    GERD (gastroesophageal reflux disease)    "seldom" (07/16/2016)   GI bleed due to NSAIDs 10/27/2015   History of blood transfusion 06/2016   post OR/notes 07/15/2016   History of hiatal hernia    History of kidney stones    surgery to remove stone   Hyperlipidemia    Hypertension    no meds    Melanoma of back ([place])    "mid back"   Sigmoid diverticulitis    with perforation   Past Surgical History:  Procedure Laterality Date   COLON SURGERY     sigmoid   COLOSTOMY TAKEDOWN N/A 06/28/2016   Procedure: COLOSTOMY TAKEDOWN;  Surgeon: [name], MD;  Location: [facility];  Service: General;  Laterality: N/A;   CYSTOSCOPY WITH RETROGRADE PYELOGRAM, URETEROSCOPY AND STENT PLACEMENT Bilateral 02/12/2020   Procedure: CYSTOSCOPY WITH BILATERAL RETROGRADE PYELOGRAM,  AND LITHOPEXY;  Surgeon: Remi Haggard, MD;  Location: [facility];  Service: Urology;  Laterality: Bilateral;  1 HR   ESOPHAGOGASTRODUODENOSCOPY N/A 10/27/2015   Procedure: ESOPHAGOGASTRODUODENOSCOPY (EGD);  Surgeon: Clarene Essex, MD;  Location: [facility] ENDOSCOPY;  Service: Endoscopy;  Laterality: N/A;   ESOPHAGOGASTRODUODENOSCOPY N/A 10/29/2015   Procedure: ESOPHAGOGASTRODUODENOSCOPY (EGD);  Surgeon: Ronald Lobo, MD;  Location:  [facility] ENDOSCOPY;  Service: Endoscopy;  Laterality: N/A;   ESOPHAGOGASTRODUODENOSCOPY N/A 10/30/2015   Procedure: ESOPHAGOGASTRODUODENOSCOPY (EGD);  Surgeon: Clarene Essex, MD;  Location: [facility] ENDOSCOPY;  Service: Endoscopy;  Laterality: N/A;   g tube discontinued  04/2020   per patient   [place]  11/21/2015   REDUCTION OF HIATAL HERNIA , REPAIR HIATAL HERNIA, RESECTION SMALL BOWEL WITH ANASTOMOSIS, PLACEMENT GASTROSTOMY TUBE, PLACEMENT DUODENOSTOMY TUBE (N/A)   HEMORRHOID BANDING  X 2   HERNIA REPAIR     HIATAL HERNIA REPAIR N/A 11/21/2015   Procedure: REDUCTION OF HIATAL HERNIA , REPAIR HIATAL HERNIA, RESECTION SMALL BOWEL WITH ANASTOMOSIS, PLACEMENT GASTROSTOMY TUBE, PLACEMENT DUODENOSTOMY TUBE;  Surgeon: Mickeal Skinner, MD;  Location: [place];  Service: General;  Laterality: N/A;   HOLMIUM LASER APPLICATION Right [id number]   Procedure: HOLMIUM LASER APPLICATION;  Surgeon: Remi Haggard, MD;  Location: [facility];  Service: Urology;  Laterality: Right;   INCISIONAL HERNIA REPAIR  06/28/2016   open/notes 07/15/2016   INCISIONAL HERNIA REPAIR  03/17/2018   WITH MESH   INCISIONAL HERNIA REPAIR N/A 03/17/2018   Procedure: INCISIONAL HERNIA REPAIR WITH MESH;  Surgeon: [name], MD;  Location: [place];  Service: General;  Laterality: N/A;   [place] N/A 10/16/2020   Procedure: Fatima Blank HERNIA REPAIR WITH MESH;  Surgeon: [name],  MD;  Location: [place];  Service: General;  Laterality: N/A;   INGUINAL HERNIA REPAIR Bilateral 09/28/2018   INGUINAL HERNIA REPAIR Bilateral 09/28/2018   Procedure: BILATERAL OPEN INGUINAL HERNIA REPAIR WITH MESH;  Surgeon: [name], MD;  Location: [place];  Service: General;  Laterality: Bilateral;  GENERAL AND TAP BLOCK   INSERTION OF MESH N/A 03/17/2018   Procedure: INSERTION OF MESH;  Surgeon: [name], MD;  Location: [place];  Service: General;  Laterality: N/A;   INSERTION OF MESH Bilateral 09/28/2018    Procedure: Insertion Of Mesh;  Surgeon: [name], MD;  Location: [place];  Service: General;  Laterality: Bilateral;   IR CM INJ ANY COLONIC TUBE W/FLUORO  02/04/2017   IR GUIDED DRAIN W CATHETER PLACEMENT  07/06/2016   /NOTES 07/15/2016   IR PATIENT EVAL TECH 0-60 MINS  06/28/2019   IR RADIOLOGIST EVAL & MGMT  07/27/2016   IR RADIOLOGIST EVAL & MGMT  08/17/2016   IR RADIOLOGIST EVAL & MGMT  08/26/2016   IR REPLACE G-TUBE SIMPLE WO FLUORO  07/28/2017   IR REPLACE G-TUBE SIMPLE WO FLUORO  01/31/2018   IR REPLACE G-TUBE SIMPLE WO FLUORO  10/16/2018   IR REPLACE G-TUBE SIMPLE WO FLUORO  03/05/2019   IR REPLACE G-TUBE SIMPLE WO FLUORO  08/22/2019   IR REPLACE G-TUBE SIMPLE WO FLUORO  01/08/2020   IR [place] GASTRO/COLONIC TUBE PERCUT W/FLUORO  08/04/2016   IR [place] GASTRO/COLONIC TUBE PERCUT W/FLUORO  01/14/2017   IR US GUIDE BX ASP/DRAIN  08/04/2016   KNEE CARTILAGE SURGERY Right 1971   "opened me up"   LAPAROTOMY N/A 07/05/2015   Procedure: PARTIAL SIGMOID COLECTOMY AND COLOSTOMY;  Surgeon: [name], MD;  Location: [place] OR;  Service: General;  Laterality: N/A;   MELANOMA EXCISION  2001   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  10/30/2015   Procedure: REMOVAL OF DISTAL STOMACH;  Surgeon: Judeth Horn, MD;  Location: [place];  Service: General;;   REPAIR OF PERFORATED ULCER N/A 10/30/2015   Procedure: REPAIR OF BLEEDING  ULCER;  Surgeon: Judeth Horn, MD;  Location: [place] OR;  Service: General;  Laterality: N/A;   TUMOR EXCISION  2009   "back; fatty tumor"   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   Bilateral inguinal hernia 09/28/2018   Incisional hernia 03/17/2018   Trigger thumb, left thumb 01/19/2018   Trigger finger, left index finger 07/18/2017   Trigger finger, right ring finger 01/31/2017   Trigger finger of left thumb 01/03/2017   Trigger finger of right thumb 01/03/2017   Trigger  index finger of left hand 01/03/2017   Trigger index finger of right hand 01/03/2017   Malnutrition of moderate degree 07/19/2016   Intra-abdominal abscess ([place]) 07/16/2016   Cellulitis 07/15/2016   S/P colostomy takedown 06/28/2016   Pain in thoracic spine 06/09/2016   Mid back pain 06/09/2016   Postoperative fever 11/19/2015   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   Severe protein-calorie malnutrition ([place]) 11/17/2015   Sepsis ([place]) 11/14/2015   Hiccups 11/14/2015   AKI (acute kidney injury) ([place]) 11/14/2015   Fever    Leg swelling    Left shoulder pain    Muscle spasm of left shoulder    GI bleed 10/27/2015   Acute blood loss anemia 10/27/2015   Syncope 10/27/2015   Hyperglycemia 10/27/2015   Hypotension 10/27/2015   Neck pain 10/27/2015  Hematemesis 10/27/2015   Hematochezia 10/27/2015   Diverticulitis of colon with perforation 07/05/2015    REFERRING DIAG: M54.50,G89.29 (ICD-10-CM) - Chronic left-sided low back pain without sciatica M54.6 (ICD-10-CM) - Pain in thoracic spine M79.18 (ICD-10-CM) - Myofascial pain syndrome G89.4 (ICD-10-CM) - Chronic pain syndrome  Rationale for Evaluation and Treatment Rehabilitation  THERAPY DIAG:  Other low back pain  Pain in thoracic spine  Abnormal posture  ONSET DATE: Chronic (years)  SUBJECTIVE:                                                                                                                                                                                           SUBJECTIVE STATEMENT: He has been doing okay and getting stronger, using stairs. He has tried the new exercises and they are going okay. He is not wearing a patch for pain currently.  PERTINENT HISTORY:  History of skilled PT services until June 2023.  History of trigger point injections and epidural injection.   Had ED visit (not seen) on 11/05/2021 for abdominal pain as well.   Hyperlipidemia, HTN, GERD in medical history.  History of several  abdominal surgeries.   PAIN:  NPRS scale: 4/10, ranged 0-7/10 since last appt Pain location: back pain (thoracic/lumbar)  Pain description: nagging ache Aggravating factors: unsure of activity that consistently worsen.  Relieving factors: rest, medicine   PRECAUTIONS: None  WEIGHT BEARING RESTRICTIONS No  FALLS:  Has patient fallen in last 6 months? No  LIVING ENVIRONMENT: Lives with: lives with their family Lives in: House/apartment Stairs:1 step to enter front, back steps 4-5.   OCCUPATION: Retired  PLOF: Independent, walking dog, general household activity, fishing  PATIENT GOALS have less pain   OBJECTIVE:   Imaging:  11/16/2021:  Thoracic MRI imaging from February exhibits minimal degenerative changes, no nerve impingement, no stenosis. Lumbar MRI imaging from 2022 exhibits multilevel facet arthropathy and broad-based disc protrusion contributing to mild foraminal narrowing bilaterally, left greater than right. No high grade spinal canal stenosis  PATIENT SURVEYS:  11/16/2021 FOTO intake:  49  predicted:  56 11/26/2021 FOTO: 50  SCREENING FOR RED FLAGS: 11/16/2021 Bowel or bladder incontinence: No Cauda equina syndrome: No  COGNITION: 11/16/2021  Overall cognitive status: WFL normal    SENSATION: 11/16/2021 [facility]  MUSCLE LENGTH: 11/16/2021  None checked today  POSTURE:  11/16/2021 Lt shoulder higher than Rt in sitting/standing, Mild Lt lateral shift of trunk, increased thoracic kyphosis, reduced lumbar lordosis in standing.   PALPATION: 11/16/2021 Lumbar paraspinal tenderness bilateral  Lt > Rt.  Lt QL tenderness  Back/ LUMBAR ROM:   AROM  AROM  11/16/2021 AROM 11/26/2021  Lumbar Flexion To mid shin no complaints.  To mid shin no complaints.   Lumbar Extension < 25 % c ERP 75% no complaints  Lumbar Right lateral flexion To mid thigh c pain To mid thigh with no complaints  Lumbar Left lateral flexion To mid thigh c pain To lateral knee with no complaints       Thoracic extension Unable to reach neutral  Unable to reach neutral, feeling pulling/knot in medial Rt scap  Thoracic flexion < 25 % 50%  Lt thoracic rotation 35 35  Rt thoracic rotation 40 36   (Blank rows = not tested)  LOWER EXTREMITY ROM:      Right 11/16/2021 Left 11/16/2021  Hip flexion    Hip extension    Hip abduction    Hip adduction    Hip internal rotation    Hip external rotation    Knee flexion    Knee extension    Ankle dorsiflexion    Ankle plantarflexion    Ankle inversion    Ankle eversion     (Blank rows = not tested)  LOWER EXTREMITY MMT:    MMT Right 11/16/2021 Left 11/16/2021  Hip flexion    Hip extension    Hip abduction    Hip adduction    Hip internal rotation    Hip external rotation    Knee flexion    Knee extension    Ankle dorsiflexion    Ankle plantarflexion             (Blank rows = not tested) 11/16/2021:  General postural strength deficits as observed by inability to hold postural corrections.   LUMBAR SPECIAL TESTS:  11/16/2021 (-) slump bilateral.  General PAIVM hypomobility noted throughout  FUNCTIONAL TESTS:  11/16/2021 None performed today  GAIT: 11/16/2021 Independent  TODAY'S TREATMENT  11/26/2021: Therex: Supine lumbar trunk rotation stretch x 5 bilateral, then x 5 bil with 5-10s hold Side lying thoracic rotation with bolster under top knee x 10 bil. Seated thoracic extension in chair 2-3 sec hold x 15 with cues for forward head facing Thoracic and lumbar AROM as indicated in objective Standing doorway stretch for pecs and shoulder ER 3 x 30s  Manual:   cPA g3 T2 in standing as he indicates pain/tightness over spinous process   11/24/2021: Therex: Seated thoracic extension in chair 2-3 sec hold x 15 Seated cervical retraction into clinician hand isometric hold 5 sec x 10 Seated bilateral scapular retraction c bilateral UE ER 2-3 sec hold x 10  Standing thoracic rotation at wall x 10 bilateral  Seated  multifidi lift on table 5 sec hold x 10 Seated ab press down on table 5 sec hold x 10   Standing tband rows green 2 x 15 Standing gh ext green band 2 x 15  UBE UE/LE fwd lvl 3.0 5 mins, UE only backward 4 mins lvl 2.0   11/19/2021: Therex: Supine lumbar trunk rotation stretch 15 sec x 5 bilateral Supine bridge 3 sec hold x 15 Seated thoracic extension in chair 2-3 sec hold x 15 Seated cervical retraction into clinician hand isometric hold 5 sec x 10 Seated bilateral scapular retraction c bilateral UE ER 2-3 sec hold x 10  Standing thoracic rotation at wall x 10 bilateral (added to home - no picture available)  UBE UE/LE fwd lvl 3.0 5 mins, UE only backward 3 mins lvl 2.0   Manual:   cPA g3 T4-L2 throughout back for mobility gains.    PATIENT  EDUCATION:  Education details: HEP progression Person educated: Patient Education method: Consulting civil engineer, Media planner, Verbal cues, and Handouts Education comprehension: verbalized understanding, returned demonstration, and verbal cues required   HOME EXERCISE PROGRAM: Access Code: [email or web address] URL: [email or web address] Date: 11/24/2021 Prepared by: Scot Jun  Exercises - Shoulder External Rotation and Scapular Retraction  - 2-3 x daily - 7 x weekly - 1 sets - 10 reps - 2 hold - Seated Thoracic Lumbar Extension  - 2-3 x daily - 7 x weekly - 1 sets - 10 reps - 3-5 hold - Supine Lower Trunk Rotation  - 2-3 x daily - 7 x weekly - 1 sets - 3-5 reps - 15 hold - Standing Lumbar Extension  - 2-3 x daily - 7 x weekly - 1 sets - 5-10 reps - Seated Multifidi Isometric  - 2-3 x daily - 7 x weekly - 1 sets - 10 reps - 5-10 hold - Seated Abdominal Press into [employer]  - 2-3 x daily - 7 x weekly - 1 sets - 10 reps - 5-10 hold  ASSESSMENT:  CLINICAL IMPRESSION: He presented today with increased pain. Lumbar extension AROM is significantly improved since evaluation with no increase in sx, with moderate improvement in thoracic  flexion and minimal change in other motions. FOTO reassessment had minimal change. He continues to benefit from skilled PT to address continued impairments and functional limitations.   OBJECTIVE IMPAIRMENTS Abnormal gait, decreased activity tolerance, decreased endurance, decreased mobility, difficulty walking, decreased ROM, decreased strength, increased fascial restrictions, impaired perceived functional ability, increased muscle spasms, impaired flexibility, improper body mechanics, postural dysfunction, and pain.   ACTIVITY LIMITATIONS carrying, lifting, bending, standing, sleeping, and locomotion level  PARTICIPATION LIMITATIONS: cleaning, interpersonal relationship, community activity, yard work, and housework  PERSONAL FACTORS  Hyperlipidemia, HTN, GERD, time since onset of symptoms  are also affecting patient's functional outcome.   REHAB POTENTIAL: Good  CLINICAL DECISION MAKING: Stable/uncomplicated  EVALUATION COMPLEXITY: Low   GOALS: Goals reviewed with patient? Yes  Short term PT Goals (target date for Short term goals are 3 weeks 12/07/2021) Patient will demonstrate independent use of home exercise program to maintain progress from in clinic treatments. Goal status: on going - assessed 11/19/2021   Long term PT goals (target dates for all long term goals are 10 weeks  01/25/2022 )   1. Patient will demonstrate/report pain at worst less than or equal to 2/10 to facilitate minimal limitation in daily activity secondary to pain symptoms. Goal status: New   2. Patient will demonstrate independent use of home exercise program to facilitate ability to maintain/progress functional gains from skilled physical therapy services. Goal status: New   3. Patient will demonstrate FOTO outcome > or = 56 % to indicate reduced disability due to condition. Goal status: New   4. Patient will demonstrate lumbar extension > or = 75 % WFL s symptoms to facilitate upright standing, walking  posture at PLOF s limitation. Goal status: New   5.  Patient will demonstrate thoracic extension 25% WFL to facilitate upright standing/sitting posture.    Goal status: New   6.  Patient will demonstrate/report ability to perform usual walking, sleeping at PLOF.   Goal status: New    PLAN: PT FREQUENCY: 1-2x/week  PT DURATION: 10 weeks  PLANNED INTERVENTIONS: Therapeutic exercises, Therapeutic activity, Neuro Muscular re-education, Balance training, Gait training, Patient/Family education, Joint mobilization, Stair training, DME instructions, Dry Needling, Electrical stimulation, Cryotherapy, Moist heat, Taping, Ultrasound, Ionotophoresis '4mg'$ /ml Dexamethasone, and Manual therapy.  All  included unless contraindicated   PLAN FOR NEXT SESSION: progressive exercises for thoracic mobility and core/back strengthening, progress HEP as indicated  Jana Hakim, SPT 8/32/23, 2:19 PM  This entire session of physical therapy was performed under the direct supervision of PT signing evaluation /treatment. PT reviewed note and agrees.  Scot Jun, PT, DPT, OCS, ATC 11/26/21  2:22 PM

## 2021-12-11 ENCOUNTER — Ambulatory Visit (INDEPENDENT_AMBULATORY_CARE_PROVIDER_SITE_OTHER): Payer: Medicare Other | Admitting: Rehabilitative and Restorative Service Providers"

## 2021-12-11 ENCOUNTER — Encounter: Payer: Self-pay | Admitting: Rehabilitative and Restorative Service Providers"

## 2021-12-11 DIAGNOSIS — M546 Pain in thoracic spine: Secondary | ICD-10-CM

## 2021-12-11 DIAGNOSIS — R293 Abnormal posture: Secondary | ICD-10-CM | POA: Diagnosis not present

## 2021-12-11 DIAGNOSIS — M5459 Other low back pain: Secondary | ICD-10-CM | POA: Diagnosis not present

## 2021-12-11 NOTE — Therapy (Unsigned)
OUTPATIENT PHYSICAL THERAPY TREATMENT   Patient Name: Miguel [name] MRN: [id number] DOB:1951-01-06, 71 y.o., male Today's Date: 12/11/2021  PCP: Alroy Dust L. Marlou Sa MD  REFERRING PROVIDER: Lorine Bears, NP  END OF SESSION:   PT End of Session - 12/11/21 0937     Visit Number 5    Number of Visits 20    Date for PT Re-Evaluation 01/25/22    Progress Note Due on Visit 10    PT Start Time 0931    PT Stop Time 1011    PT Time Calculation (min) 40 min    Activity Tolerance Patient tolerated treatment well    Behavior During Therapy WFL for tasks assessed/performed             Past Medical History:  Diagnosis Date   Anemia    Anxiety    BPH (benign prostatic hyperplasia)    Gastritis    GERD (gastroesophageal reflux disease)    "seldom" (07/16/2016)   GI bleed due to NSAIDs 10/27/2015   History of blood transfusion 06/2016   post OR/notes 07/15/2016   History of hiatal hernia    History of kidney stones    surgery to remove stone   Hyperlipidemia    Hypertension    no meds    Melanoma of back ([place])    "mid back"   Sigmoid diverticulitis    with perforation   Past Surgical History:  Procedure Laterality Date   COLON SURGERY     sigmoid   COLOSTOMY TAKEDOWN N/A 06/28/2016   Procedure: COLOSTOMY TAKEDOWN;  Surgeon: [name], MD;  Location: [facility];  Service: General;  Laterality: N/A;   CYSTOSCOPY WITH RETROGRADE PYELOGRAM, URETEROSCOPY AND STENT PLACEMENT Bilateral 02/12/2020   Procedure: CYSTOSCOPY WITH BILATERAL RETROGRADE PYELOGRAM,  AND LITHOPEXY;  Surgeon: Remi Haggard, MD;  Location: [facility];  Service: Urology;  Laterality: Bilateral;  1 HR   ESOPHAGOGASTRODUODENOSCOPY N/A 10/27/2015   Procedure: ESOPHAGOGASTRODUODENOSCOPY (EGD);  Surgeon: Clarene Essex, MD;  Location: [facility] ENDOSCOPY;  Service: Endoscopy;  Laterality: N/A;   ESOPHAGOGASTRODUODENOSCOPY N/A 10/29/2015   Procedure: ESOPHAGOGASTRODUODENOSCOPY (EGD);  Surgeon: Ronald Lobo, MD;  Location:  [facility] ENDOSCOPY;  Service: Endoscopy;  Laterality: N/A;   ESOPHAGOGASTRODUODENOSCOPY N/A 10/30/2015   Procedure: ESOPHAGOGASTRODUODENOSCOPY (EGD);  Surgeon: Clarene Essex, MD;  Location: [facility] ENDOSCOPY;  Service: Endoscopy;  Laterality: N/A;   g tube discontinued  04/2020   per patient   [place]  11/21/2015   REDUCTION OF HIATAL HERNIA , REPAIR HIATAL HERNIA, RESECTION SMALL BOWEL WITH ANASTOMOSIS, PLACEMENT GASTROSTOMY TUBE, PLACEMENT DUODENOSTOMY TUBE (N/A)   HEMORRHOID BANDING  X 2   HERNIA REPAIR     HIATAL HERNIA REPAIR N/A 11/21/2015   Procedure: REDUCTION OF HIATAL HERNIA , REPAIR HIATAL HERNIA, RESECTION SMALL BOWEL WITH ANASTOMOSIS, PLACEMENT GASTROSTOMY TUBE, PLACEMENT DUODENOSTOMY TUBE;  Surgeon: Mickeal Skinner, MD;  Location: [place];  Service: General;  Laterality: N/A;   HOLMIUM LASER APPLICATION Right 11/94/1740   Procedure: HOLMIUM LASER APPLICATION;  Surgeon: Remi Haggard, MD;  Location: [facility];  Service: Urology;  Laterality: Right;   INCISIONAL HERNIA REPAIR  06/28/2016   open/notes 07/15/2016   INCISIONAL HERNIA REPAIR  03/17/2018   WITH MESH   INCISIONAL HERNIA REPAIR N/A 03/17/2018   Procedure: INCISIONAL HERNIA REPAIR WITH MESH;  Surgeon: [name], MD;  Location: [place];  Service: General;  Laterality: N/A;   [place] N/A 10/16/2020   Procedure: Fatima Blank HERNIA REPAIR WITH MESH;  Surgeon: [name],  MD;  Location: [place];  Service: General;  Laterality: N/A;   INGUINAL HERNIA REPAIR Bilateral 09/28/2018   INGUINAL HERNIA REPAIR Bilateral 09/28/2018   Procedure: BILATERAL OPEN INGUINAL HERNIA REPAIR WITH MESH;  Surgeon: [name], MD;  Location: [place];  Service: General;  Laterality: Bilateral;  GENERAL AND TAP BLOCK   INSERTION OF MESH N/A 03/17/2018   Procedure: INSERTION OF MESH;  Surgeon: [name], MD;  Location: [place];  Service: General;  Laterality: N/A;   INSERTION OF MESH Bilateral 09/28/2018    Procedure: Insertion Of Mesh;  Surgeon: [name], MD;  Location: [place];  Service: General;  Laterality: Bilateral;   IR CM INJ ANY COLONIC TUBE W/FLUORO  02/04/2017   IR GUIDED DRAIN W CATHETER PLACEMENT  07/06/2016   /NOTES 07/15/2016   IR PATIENT EVAL TECH 0-60 MINS  06/28/2019   IR RADIOLOGIST EVAL & MGMT  07/27/2016   IR RADIOLOGIST EVAL & MGMT  08/17/2016   IR RADIOLOGIST EVAL & MGMT  08/26/2016   IR REPLACE G-TUBE SIMPLE WO FLUORO  07/28/2017   IR REPLACE G-TUBE SIMPLE WO FLUORO  01/31/2018   IR REPLACE G-TUBE SIMPLE WO FLUORO  10/16/2018   IR REPLACE G-TUBE SIMPLE WO FLUORO  03/05/2019   IR REPLACE G-TUBE SIMPLE WO FLUORO  08/22/2019   IR REPLACE G-TUBE SIMPLE WO FLUORO  01/08/2020   IR [place] GASTRO/COLONIC TUBE PERCUT W/FLUORO  08/04/2016   IR [place] GASTRO/COLONIC TUBE PERCUT W/FLUORO  01/14/2017   IR US GUIDE BX ASP/DRAIN  08/04/2016   KNEE CARTILAGE SURGERY Right 1971   "opened me up"   LAPAROTOMY N/A 07/05/2015   Procedure: PARTIAL SIGMOID COLECTOMY AND COLOSTOMY;  Surgeon: [name], MD;  Location: [place] OR;  Service: General;  Laterality: N/A;   MELANOMA EXCISION  2001   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  10/30/2015   Procedure: REMOVAL OF DISTAL STOMACH;  Surgeon: Judeth Horn, MD;  Location: [place];  Service: General;;   REPAIR OF PERFORATED ULCER N/A 10/30/2015   Procedure: REPAIR OF BLEEDING  ULCER;  Surgeon: Judeth Horn, MD;  Location: [place] OR;  Service: General;  Laterality: N/A;   TUMOR EXCISION  2009   "back; fatty tumor"   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   Bilateral inguinal hernia 09/28/2018   Incisional hernia 03/17/2018   Trigger thumb, left thumb 01/19/2018   Trigger finger, left index finger 07/18/2017   Trigger finger, right ring finger 01/31/2017   Trigger finger of left thumb 01/03/2017   Trigger finger of right thumb 01/03/2017   Trigger  index finger of left hand 01/03/2017   Trigger index finger of right hand 01/03/2017   Malnutrition of moderate degree 07/19/2016   Intra-abdominal abscess ([place]) 07/16/2016   Cellulitis 07/15/2016   S/P colostomy takedown 06/28/2016   Pain in thoracic spine 06/09/2016   Mid back pain 06/09/2016   Postoperative fever 11/19/2015   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   Severe protein-calorie malnutrition ([place]) 11/17/2015   Sepsis ([place]) 11/14/2015   Hiccups 11/14/2015   AKI (acute kidney injury) ([place]) 11/14/2015   Fever    Leg swelling    Left shoulder pain    Muscle spasm of left shoulder    GI bleed 10/27/2015   Acute blood loss anemia 10/27/2015   Syncope 10/27/2015   Hyperglycemia 10/27/2015   Hypotension 10/27/2015   Neck pain 10/27/2015  Hematemesis 10/27/2015   Hematochezia 10/27/2015   Diverticulitis of colon with perforation 07/05/2015    REFERRING DIAG: M54.50,G89.29 (ICD-10-CM) - Chronic left-sided low back pain without sciatica M54.6 (ICD-10-CM) - Pain in thoracic spine M79.18 (ICD-10-CM) - Myofascial pain syndrome G89.4 (ICD-10-CM) - Chronic pain syndrome  Rationale for Evaluation and Treatment Rehabilitation  THERAPY DIAG:  Other low back pain  Pain in thoracic spine  Abnormal posture  ONSET DATE: Chronic (years)  SUBJECTIVE:                                                                                                                                                                                           SUBJECTIVE STATEMENT: He has been doing okay with some days better than others. He feels a stinging pain at Lt inferior scapula and usually puts a patch there to manage pain. He has tried the exercises, not feeling any change in sx. He stopped taking Meloxicam after noticing swelling and redness in his legs, feeling slower, and reading the side effects of that medication. He doesn't note any specific activities he cannot do, but walking his dogs  increases his pain. He rode the bike yesterday and that felt easier than walking.  PERTINENT HISTORY:  History of skilled PT services until June 2023.  History of trigger point injections and epidural injection.   Had ED visit (not seen) on 11/05/2021 for abdominal pain as well.   Hyperlipidemia, HTN, GERD in medical history.  History of several abdominal surgeries.   PAIN:  NPRS scale: currently 4/10, highest 6/10 since last appt Pain location: back pain (thoracic/lumbar)  Pain description: nagging ache Aggravating factors: unsure of activity that consistently worsen.  Relieving factors: rest, medicine   PRECAUTIONS: None  WEIGHT BEARING RESTRICTIONS No  FALLS:  Has patient fallen in last 6 months? No  LIVING ENVIRONMENT: Lives with: lives with their family Lives in: House/apartment Stairs:1 step to enter front, back steps 4-5.   OCCUPATION: Retired  PLOF: Independent, walking dog, general household activity, fishing  PATIENT GOALS have less pain   OBJECTIVE:   Imaging:  11/16/2021:  Thoracic MRI imaging from February exhibits minimal degenerative changes, no nerve impingement, no stenosis. Lumbar MRI imaging from 2022 exhibits multilevel facet arthropathy and broad-based disc protrusion contributing to mild foraminal narrowing bilaterally, left greater than right. No high grade spinal canal stenosis  PATIENT SURVEYS:  11/16/2021 FOTO intake:  49  predicted:  56 11/26/2021 FOTO: 50  SCREENING FOR RED FLAGS: 11/16/2021 Bowel or bladder incontinence: No Cauda equina syndrome: No  COGNITION: 11/16/2021  Overall cognitive status: WFL normal    SENSATION: 11/16/2021 [facility]  MUSCLE  LENGTH: 11/16/2021  None checked today  POSTURE:  11/16/2021 Lt shoulder higher than Rt in sitting/standing, Mild Lt lateral shift of trunk, increased thoracic kyphosis, reduced lumbar lordosis in standing.   PALPATION: 11/16/2021 Lumbar paraspinal tenderness bilateral  Lt > Rt.  Lt QL  tenderness  Back/ LUMBAR ROM:   AROM  AROM  11/16/2021 AROM 11/26/2021 AROM 12/11/21  Lumbar Flexion To mid shin no complaints.  To mid shin no complaints.  To mid shin with pulling stretch in middle back   Lumbar Extension < 25 % c ERP 75% no complaints 75% no complaints  Lumbar Right lateral flexion To mid thigh c pain To mid thigh with no complaints To mid thigh with no complaints  Lumbar Left lateral flexion To mid thigh c pain To lateral knee with no complaints To mid thigh with no complaints       Thoracic extension Unable to reach neutral  Unable to reach neutral, feeling pulling/knot in medial Rt scap Unable to reach neutral  Thoracic flexion < 25 % 50% 50%  Lt thoracic rotation 35 35 36  Rt thoracic rotation 40 36 33   (Blank rows = not tested)  LOWER EXTREMITY ROM:      Right 11/16/2021 Left 11/16/2021  Hip flexion    Hip extension    Hip abduction    Hip adduction    Hip internal rotation    Hip external rotation    Knee flexion    Knee extension    Ankle dorsiflexion    Ankle plantarflexion    Ankle inversion    Ankle eversion     (Blank rows = not tested)  LOWER EXTREMITY MMT:    MMT Right 11/16/2021 Left 11/16/2021  Hip flexion    Hip extension    Hip abduction    Hip adduction    Hip internal rotation    Hip external rotation    Knee flexion    Knee extension    Ankle dorsiflexion    Ankle plantarflexion             (Blank rows = not tested) 11/16/2021:  General postural strength deficits as observed by inability to hold postural corrections.   LUMBAR SPECIAL TESTS:  11/16/2021 (-) slump bilateral.  General PAIVM hypomobility noted throughout  FUNCTIONAL TESTS:  11/16/2021 None performed today  GAIT: 11/16/2021 Independent  TODAY'S TREATMENT  12/11/2021: Therex: UBE UE/LE fwd lvl 1 6.5 mins slow revolutions, UE only backward 3 mins lvl 1 Seated thoracic extension in chair 2-3 sec hold x 15 with cues for forward head facing STS x 10 to  standard chair height with cues for controlled eccentric, hands on knees Seated bilateral scapular retraction c bilateral UE ER 2-3 sec hold x 10  Seated bil scap retraction with shoulder abduction and elbows bent (like field goal position) x 10 Seated SLR x 10 bil. Standing thoracic rotation at wall x 10 bilateral   11/26/2021: Therex: Supine lumbar trunk rotation stretch x 5 bilateral, then x 5 bil with 5-10s hold Side lying thoracic rotation with bolster under top knee x 10 bil. Seated thoracic extension in chair 2-3 sec hold x 15 with cues for forward head facing Thoracic and lumbar AROM as indicated in objective Standing doorway stretch for pecs and shoulder ER 3 x 30s  Manual:   cPA g3 T2 in standing as he indicates pain/tightness over spinous process   11/24/2021: Therex: Seated thoracic extension in chair 2-3 sec hold x 15 Seated cervical  retraction into clinician hand isometric hold 5 sec x 10 Seated bilateral scapular retraction c bilateral UE ER 2-3 sec hold x 10  Standing thoracic rotation at wall x 10 bilateral  Seated multifidi lift on table 5 sec hold x 10 Seated ab press down on table 5 sec hold x 10   Standing tband rows green 2 x 15 Standing gh ext green band 2 x 15  UBE UE/LE fwd lvl 3.0 5 mins, UE only backward 4 mins lvl 2.0   PATIENT EDUCATION:  Education details: HEP progression Person educated: Patient Education method: Consulting civil engineer, Demonstration, Verbal cues, and Handouts Education comprehension: verbalized understanding, returned demonstration, and verbal cues required   HOME EXERCISE PROGRAM: Access Code: [email or web address] URL: [email or web address] Date: 12/11/2021 Prepared by: Scot Jun  Exercises - Shoulder External Rotation and Scapular Retraction  - 2-3 x daily - 7 x weekly - 1 sets - 10 reps - 2 hold - Seated Thoracic Lumbar Extension  - 2-3 x daily - 7 x weekly - 1 sets - 10 reps - 3-5 hold - Supine Lower Trunk Rotation  - 2-3  x daily - 7 x weekly - 1 sets - 3-5 reps - 15 hold - Standing Thoracic Rotation at Wall  - 2-3 x daily - 7 x weekly - 1 sets - 8-10 reps - 3-5 hold - Standing Lumbar Extension  - 2-3 x daily - 7 x weekly - 1 sets - 5-10 reps - Seated Multifidi Isometric  - 2-3 x daily - 7 x weekly - 1 sets - 10 reps - 5-10 hold - Seated Abdominal Press into [employer]  - 2-3 x daily - 7 x weekly - 1 sets - 10 reps - 5-10 hold - Sit to Stand  - 3 x daily - 7 x weekly - 1 sets - 10 reps  ASSESSMENT:  CLINICAL IMPRESSION: He presented today with not much change since last appointment. AROM is mostly unchanged since assessment 2 weeks ago, though lumbar and thoracic motions remain pain-free. HEP was updated to include LE strengthening activity as he noted feeling leg heaviness while walking. He continues to benefit from skilled PT.  OBJECTIVE IMPAIRMENTS Abnormal gait, decreased activity tolerance, decreased endurance, decreased mobility, difficulty walking, decreased ROM, decreased strength, increased fascial restrictions, impaired perceived functional ability, increased muscle spasms, impaired flexibility, improper body mechanics, postural dysfunction, and pain.   ACTIVITY LIMITATIONS carrying, lifting, bending, standing, sleeping, and locomotion level  PARTICIPATION LIMITATIONS: cleaning, interpersonal relationship, community activity, yard work, and housework  PERSONAL FACTORS  Hyperlipidemia, HTN, GERD, time since onset of symptoms  are also affecting patient's functional outcome.   REHAB POTENTIAL: Good  CLINICAL DECISION MAKING: Stable/uncomplicated  EVALUATION COMPLEXITY: Low   GOALS: Goals reviewed with patient? Yes  Short term PT Goals (target date for Short term goals are 3 weeks 12/07/2021) Patient will demonstrate independent use of home exercise program to maintain progress from in clinic treatments. Goal status: on going - assessed 11/19/2021   Long term PT goals (target dates for all long  term goals are 10 weeks  01/25/2022 )   1. Patient will demonstrate/report pain at worst less than or equal to 2/10 to facilitate minimal limitation in daily activity secondary to pain symptoms. Goal status: New   2. Patient will demonstrate independent use of home exercise program to facilitate ability to maintain/progress functional gains from skilled physical therapy services. Goal status: New   3. Patient will demonstrate FOTO outcome > or = 56 % to indicate  reduced disability due to condition. Goal status: New   4. Patient will demonstrate lumbar extension > or = 75 % WFL s symptoms to facilitate upright standing, walking posture at PLOF s limitation. Goal status: New   5.  Patient will demonstrate thoracic extension 25% WFL to facilitate upright standing/sitting posture.    Goal status: New   6.  Patient will demonstrate/report ability to perform usual walking, sleeping at PLOF.   Goal status: New    PLAN: PT FREQUENCY: 1-2x/week  PT DURATION: 10 weeks  PLANNED INTERVENTIONS: Therapeutic exercises, Therapeutic activity, Neuro Muscular re-education, Balance training, Gait training, Patient/Family education, Joint mobilization, Stair training, DME instructions, Dry Needling, Electrical stimulation, Cryotherapy, Moist heat, Taping, Ultrasound, Ionotophoresis '4mg'$ /ml Dexamethasone, and Manual therapy.  All included unless contraindicated   PLAN FOR NEXT SESSION: progressive exercises for core/lumbar strengthening and LE strengthening   Jana Hakim, Student-PT 12/11/2021, 2:08 PM  This entire session of physical therapy was performed under the direct supervision of PT signing evaluation /treatment. PT reviewed note and agrees.  Scot Jun, PT, DPT, OCS, ATC 12/11/21  2:08 PM

## 2021-12-15 ENCOUNTER — Encounter: Payer: Self-pay | Admitting: Rehabilitative and Restorative Service Providers"

## 2021-12-15 ENCOUNTER — Ambulatory Visit (INDEPENDENT_AMBULATORY_CARE_PROVIDER_SITE_OTHER): Payer: Medicare Other | Admitting: Rehabilitative and Restorative Service Providers"

## 2021-12-15 DIAGNOSIS — R293 Abnormal posture: Secondary | ICD-10-CM

## 2021-12-15 DIAGNOSIS — M546 Pain in thoracic spine: Secondary | ICD-10-CM | POA: Diagnosis not present

## 2021-12-15 DIAGNOSIS — M5459 Other low back pain: Secondary | ICD-10-CM

## 2021-12-15 NOTE — Therapy (Signed)
OUTPATIENT PHYSICAL THERAPY TREATMENT   Patient Name: Miguel [name] MRN: [id number] DOB:07-05-1950, 71 y.o., male Today's Date: 12/15/2021  PCP: Alroy Dust L. Marlou Sa MD  REFERRING PROVIDER: Lorine Bears, NP  END OF SESSION:   PT End of Session - 12/15/21 1038     Visit Number 6    Number of Visits 20    Date for PT Re-Evaluation 01/25/22    Progress Note Due on Visit 10    PT Start Time 6712    PT Stop Time 1054    PT Time Calculation (min) 39 min    Activity Tolerance Patient tolerated treatment well    Behavior During Therapy WFL for tasks assessed/performed              Past Medical History:  Diagnosis Date   Anemia    Anxiety    BPH (benign prostatic hyperplasia)    Gastritis    GERD (gastroesophageal reflux disease)    "seldom" (07/16/2016)   GI bleed due to NSAIDs 10/27/2015   History of blood transfusion 06/2016   post OR/notes 07/15/2016   History of hiatal hernia    History of kidney stones    surgery to remove stone   Hyperlipidemia    Hypertension    no meds    Melanoma of back ([place])    "mid back"   Sigmoid diverticulitis    with perforation   Past Surgical History:  Procedure Laterality Date   COLON SURGERY     sigmoid   COLOSTOMY TAKEDOWN N/A 06/28/2016   Procedure: COLOSTOMY TAKEDOWN;  Surgeon: [name], MD;  Location: [facility];  Service: General;  Laterality: N/A;   CYSTOSCOPY WITH RETROGRADE PYELOGRAM, URETEROSCOPY AND STENT PLACEMENT Bilateral 02/12/2020   Procedure: CYSTOSCOPY WITH BILATERAL RETROGRADE PYELOGRAM,  AND LITHOPEXY;  Surgeon: Remi Haggard, MD;  Location: [facility];  Service: Urology;  Laterality: Bilateral;  1 HR   ESOPHAGOGASTRODUODENOSCOPY N/A 10/27/2015   Procedure: ESOPHAGOGASTRODUODENOSCOPY (EGD);  Surgeon: Clarene Essex, MD;  Location: [facility] ENDOSCOPY;  Service: Endoscopy;  Laterality: N/A;   ESOPHAGOGASTRODUODENOSCOPY N/A 10/29/2015   Procedure: ESOPHAGOGASTRODUODENOSCOPY (EGD);  Surgeon: Ronald Lobo, MD;   Location: [facility] ENDOSCOPY;  Service: Endoscopy;  Laterality: N/A;   ESOPHAGOGASTRODUODENOSCOPY N/A 10/30/2015   Procedure: ESOPHAGOGASTRODUODENOSCOPY (EGD);  Surgeon: Clarene Essex, MD;  Location: [facility] ENDOSCOPY;  Service: Endoscopy;  Laterality: N/A;   g tube discontinued  04/2020   per patient   [place]  11/21/2015   REDUCTION OF HIATAL HERNIA , REPAIR HIATAL HERNIA, RESECTION SMALL BOWEL WITH ANASTOMOSIS, PLACEMENT GASTROSTOMY TUBE, PLACEMENT DUODENOSTOMY TUBE (N/A)   HEMORRHOID BANDING  X 2   HERNIA REPAIR     HIATAL HERNIA REPAIR N/A 11/21/2015   Procedure: REDUCTION OF HIATAL HERNIA , REPAIR HIATAL HERNIA, RESECTION SMALL BOWEL WITH ANASTOMOSIS, PLACEMENT GASTROSTOMY TUBE, PLACEMENT DUODENOSTOMY TUBE;  Surgeon: Mickeal Skinner, MD;  Location: [place];  Service: General;  Laterality: N/A;   HOLMIUM LASER APPLICATION Right 45/80/9983   Procedure: HOLMIUM LASER APPLICATION;  Surgeon: Remi Haggard, MD;  Location: [facility];  Service: Urology;  Laterality: Right;   INCISIONAL HERNIA REPAIR  06/28/2016   open/notes 07/15/2016   INCISIONAL HERNIA REPAIR  03/17/2018   WITH MESH   INCISIONAL HERNIA REPAIR N/A 03/17/2018   Procedure: INCISIONAL HERNIA REPAIR WITH MESH;  Surgeon: [name], MD;  Location: [place];  Service: General;  Laterality: N/A;   [place] N/A 10/16/2020   Procedure: Fatima Blank HERNIA REPAIR WITH MESH;  Surgeon: Ninfa Linden,  Nathaneil Canary, MD;  Location: [place];  Service: General;  Laterality: N/A;   INGUINAL HERNIA REPAIR Bilateral 09/28/2018   INGUINAL HERNIA REPAIR Bilateral 09/28/2018   Procedure: BILATERAL OPEN INGUINAL HERNIA REPAIR WITH MESH;  Surgeon: [name], MD;  Location: [place];  Service: General;  Laterality: Bilateral;  GENERAL AND TAP BLOCK   INSERTION OF MESH N/A 03/17/2018   Procedure: INSERTION OF MESH;  Surgeon: [name], MD;  Location: [place];  Service: General;  Laterality: N/A;   INSERTION OF MESH Bilateral  09/28/2018   Procedure: Insertion Of Mesh;  Surgeon: [name], MD;  Location: [place];  Service: General;  Laterality: Bilateral;   IR CM INJ ANY COLONIC TUBE W/FLUORO  02/04/2017   IR GUIDED DRAIN W CATHETER PLACEMENT  07/06/2016   /NOTES 07/15/2016   IR PATIENT EVAL TECH 0-60 MINS  06/28/2019   IR RADIOLOGIST EVAL & MGMT  07/27/2016   IR RADIOLOGIST EVAL & MGMT  08/17/2016   IR RADIOLOGIST EVAL & MGMT  08/26/2016   IR REPLACE G-TUBE SIMPLE WO FLUORO  07/28/2017   IR REPLACE G-TUBE SIMPLE WO FLUORO  01/31/2018   IR REPLACE G-TUBE SIMPLE WO FLUORO  10/16/2018   IR REPLACE G-TUBE SIMPLE WO FLUORO  03/05/2019   IR REPLACE G-TUBE SIMPLE WO FLUORO  08/22/2019   IR REPLACE G-TUBE SIMPLE WO FLUORO  01/08/2020   IR [place] GASTRO/COLONIC TUBE PERCUT W/FLUORO  08/04/2016   IR [place] GASTRO/COLONIC TUBE PERCUT W/FLUORO  01/14/2017   IR US GUIDE BX ASP/DRAIN  08/04/2016   KNEE CARTILAGE SURGERY Right 1971   "opened me up"   LAPAROTOMY N/A 07/05/2015   Procedure: PARTIAL SIGMOID COLECTOMY AND COLOSTOMY;  Surgeon: [name], MD;  Location: [place] OR;  Service: General;  Laterality: N/A;   MELANOMA EXCISION  2001   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  10/30/2015   Procedure: REMOVAL OF DISTAL STOMACH;  Surgeon: Judeth Horn, MD;  Location: [place];  Service: General;;   REPAIR OF PERFORATED ULCER N/A 10/30/2015   Procedure: REPAIR OF BLEEDING  ULCER;  Surgeon: Judeth Horn, MD;  Location: [place] [facility];  Service: General;  Laterality: N/A;   TUMOR EXCISION  2009   "back; fatty tumor"   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   Bilateral inguinal hernia 09/28/2018   Incisional hernia 03/17/2018   Trigger thumb, left thumb 01/19/2018   Trigger finger, left index finger 07/18/2017   Trigger finger, right ring finger 01/31/2017   Trigger finger of left thumb 01/03/2017   Trigger finger of right thumb 01/03/2017    Trigger index finger of left hand 01/03/2017   Trigger index finger of right hand 01/03/2017   Malnutrition of moderate degree 07/19/2016   Intra-abdominal abscess ([place]) 07/16/2016   Cellulitis 07/15/2016   S/P colostomy takedown 06/28/2016   Pain in thoracic spine 06/09/2016   Mid back pain 06/09/2016   Postoperative fever 11/19/2015   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   Severe protein-calorie malnutrition ([place]) 11/17/2015   Sepsis ([place]) 11/14/2015   Hiccups 11/14/2015   AKI (acute kidney injury) ([place]) 11/14/2015   Fever    Leg swelling    Left shoulder pain    Muscle spasm of left shoulder    GI bleed 10/27/2015   Acute blood loss anemia 10/27/2015   Syncope 10/27/2015   Hyperglycemia 10/27/2015   Hypotension 10/27/2015   Neck pain  10/27/2015   Hematemesis 10/27/2015   Hematochezia 10/27/2015   Diverticulitis of colon with perforation 07/05/2015    REFERRING DIAG: M54.50,G89.29 (ICD-10-CM) - Chronic left-sided low back pain without sciatica M54.6 (ICD-10-CM) - Pain in thoracic spine M79.18 (ICD-10-CM) - Myofascial pain syndrome G89.4 (ICD-10-CM) - Chronic pain syndrome  Rationale for Evaluation and Treatment Rehabilitation  THERAPY DIAG:  Other low back pain  Pain in thoracic spine  Abnormal posture  ONSET DATE: Chronic (years)  SUBJECTIVE:                                                                                                                                                                                           SUBJECTIVE STATEMENT: Pt indicated doing a lot of yard work and felt pain afterwards in thoracic region.  Woke up not hurting too bad.  Reported 4/10 pain in that area today upon arrival.   PERTINENT HISTORY:  History of skilled PT services until June 2023.  History of trigger point injections and epidural injection.   Had ED visit (not seen) on 11/05/2021 for abdominal pain as well.   Hyperlipidemia, HTN, GERD in medical history.  History of  several abdominal surgeries.   PAIN:  NPRS scale: currently 4/10 Pain location: back pain (thoracic/lumbar)  Pain description: nagging ache Aggravating factors: unsure of activity that consistently worsen.  Relieving factors: rest, medicine   PRECAUTIONS: None  WEIGHT BEARING RESTRICTIONS No  FALLS:  Has patient fallen in last 6 months? No  LIVING ENVIRONMENT: Lives with: lives with their family Lives in: House/apartment Stairs:1 step to enter front, back steps 4-5.   OCCUPATION: Retired  PLOF: Independent, walking dog, general household activity, fishing  PATIENT GOALS have less pain   OBJECTIVE:   Imaging:  11/16/2021:  Thoracic MRI imaging from February exhibits minimal degenerative changes, no nerve impingement, no stenosis. Lumbar MRI imaging from 2022 exhibits multilevel facet arthropathy and broad-based disc protrusion contributing to mild foraminal narrowing bilaterally, left greater than right. No high grade spinal canal stenosis  PATIENT SURVEYS:  11/16/2021 FOTO intake:  49  predicted:  56 11/26/2021 FOTO: 50  SCREENING FOR RED FLAGS: 11/16/2021 Bowel or bladder incontinence: No Cauda equina syndrome: No  COGNITION: 11/16/2021  Overall cognitive status: WFL normal    SENSATION: 11/16/2021 [facility]  MUSCLE LENGTH: 11/16/2021  None checked today  POSTURE:  11/16/2021 Lt shoulder higher than Rt in sitting/standing, Mild Lt lateral shift of trunk, increased thoracic kyphosis, reduced lumbar lordosis in standing.   PALPATION: 11/16/2021 Lumbar paraspinal tenderness bilateral  Lt > Rt.  Lt QL tenderness  Back/ LUMBAR ROM:   AROM  AROM  11/16/2021  AROM 11/26/2021 AROM 12/11/21  Lumbar Flexion To mid shin no complaints.  To mid shin no complaints.  To mid shin with pulling stretch in middle back   Lumbar Extension < 25 % c ERP 75% no complaints 75% no complaints  Lumbar Right lateral flexion To mid thigh c pain To mid thigh with no complaints To mid thigh with  no complaints  Lumbar Left lateral flexion To mid thigh c pain To lateral knee with no complaints To mid thigh with no complaints       Thoracic extension Unable to reach neutral  Unable to reach neutral, feeling pulling/knot in medial Rt scap Unable to reach neutral  Thoracic flexion < 25 % 50% 50%  Lt thoracic rotation 35 35 36  Rt thoracic rotation 40 36 33   (Blank rows = not tested)  LOWER EXTREMITY ROM:      Right 11/16/2021 Left 11/16/2021  Hip flexion    Hip extension    Hip abduction    Hip adduction    Hip internal rotation    Hip external rotation    Knee flexion    Knee extension    Ankle dorsiflexion    Ankle plantarflexion    Ankle inversion    Ankle eversion     (Blank rows = not tested)  LOWER EXTREMITY MMT:    MMT Right 11/16/2021 Left 11/16/2021  Hip flexion    Hip extension    Hip abduction    Hip adduction    Hip internal rotation    Hip external rotation    Knee flexion    Knee extension    Ankle dorsiflexion    Ankle plantarflexion             (Blank rows = not tested) 11/16/2021:  General postural strength deficits as observed by inability to hold postural corrections.   LUMBAR SPECIAL TESTS:  11/16/2021 (-) slump bilateral.  General PAIVM hypomobility noted throughout  FUNCTIONAL TESTS:  11/16/2021 None performed today  GAIT: 11/16/2021 Independent  TODAY'S TREATMENT  12/15/2021:  Manual: Skilled palpation c Dry needling.  Prone g3 cPA T3-T9 for mobility gains.   Dry needling: Verbal consent by Pt given.  Twitch response c concordant symptoms in area at multifidi T 6-T8.  Handout provided for education.    Therex:  Prone scapular retraction hold 5 sec x 10 Standing thoracic rotation at wall x 10 bilateral Tband rows green band 2 x 15  Tband gh ext green c pause in end range at legs 2 x 15    12/11/2021: Therex: UBE UE/LE fwd lvl 1 6.5 mins slow revolutions, UE only backward 3 mins lvl 1 Seated thoracic extension in chair 2-3  sec hold x 15 with cues for forward head facing STS x 10 to standard chair height with cues for controlled eccentric, hands on knees Seated bilateral scapular retraction c bilateral UE ER 2-3 sec hold x 10  Seated bil scap retraction with shoulder abduction and elbows bent (like field goal position) x 10 Seated SLR x 10 bil. Standing thoracic rotation at wall x 10 bilateral   11/26/2021: Therex: Supine lumbar trunk rotation stretch x 5 bilateral, then x 5 bil with 5-10s hold Side lying thoracic rotation with bolster under top knee x 10 bil. Seated thoracic extension in chair 2-3 sec hold x 15 with cues for forward head facing Thoracic and lumbar AROM as indicated in objective Standing doorway stretch for pecs and shoulder ER 3 x 30s  Manual:  cPA g3 T2 in standing as he indicates pain/tightness over spinous process    PATIENT EDUCATION:  Education details: HEP progression Person educated: Patient Education method: Explanation, Demonstration, Verbal cues, and Handouts Education comprehension: verbalized understanding, returned demonstration, and verbal cues required   HOME EXERCISE PROGRAM: Access Code: [email or web address] URL: [email or web address] Date: 12/11/2021 Prepared by: Scot Jun  Exercises - Shoulder External Rotation and Scapular Retraction  - 2-3 x daily - 7 x weekly - 1 sets - 10 reps - 2 hold - Seated Thoracic Lumbar Extension  - 2-3 x daily - 7 x weekly - 1 sets - 10 reps - 3-5 hold - Supine Lower Trunk Rotation  - 2-3 x daily - 7 x weekly - 1 sets - 3-5 reps - 15 hold - Standing Thoracic Rotation at Wall  - 2-3 x daily - 7 x weekly - 1 sets - 8-10 reps - 3-5 hold - Standing Lumbar Extension  - 2-3 x daily - 7 x weekly - 1 sets - 5-10 reps - Seated Multifidi Isometric  - 2-3 x daily - 7 x weekly - 1 sets - 10 reps - 5-10 hold - Seated Abdominal Press into [employer]  - 2-3 x daily - 7 x weekly - 1 sets - 10 reps - 5-10 hold - Sit to Stand  - 3 x daily - 7  x weekly - 1 sets - 10 reps  ASSESSMENT:  CLINICAL IMPRESSION: Conitnued complaints in mid thoracic region, noted c sustained yardwork activity primarily since last visit.  Mobility deficits still noted in thoracic spine c postural changes that can impair functional mobility.  Good tolerance c PAIVM and dry needling intervention today.  Reviewed continued use of HEP for postural strengthening.   OBJECTIVE IMPAIRMENTS Abnormal gait, decreased activity tolerance, decreased endurance, decreased mobility, difficulty walking, decreased ROM, decreased strength, increased fascial restrictions, impaired perceived functional ability, increased muscle spasms, impaired flexibility, improper body mechanics, postural dysfunction, and pain.   ACTIVITY LIMITATIONS carrying, lifting, bending, standing, sleeping, and locomotion level  PARTICIPATION LIMITATIONS: cleaning, interpersonal relationship, community activity, yard work, and housework  PERSONAL FACTORS  Hyperlipidemia, HTN, GERD, time since onset of symptoms  are also affecting patient's functional outcome.   REHAB POTENTIAL: Good  CLINICAL DECISION MAKING: Stable/uncomplicated  EVALUATION COMPLEXITY: Low   GOALS: Goals reviewed with patient? Yes  Short term PT Goals (target date for Short term goals are 3 weeks 12/07/2021) Patient will demonstrate independent use of home exercise program to maintain progress from in clinic treatments. Goal status: Met    Long term PT goals (target dates for all long term goals are 10 weeks  01/25/2022 )   1. Patient will demonstrate/report pain at worst less than or equal to 2/10 to facilitate minimal limitation in daily activity secondary to pain symptoms. Goal status: on going - assessed 12/15/2021   2. Patient will demonstrate independent use of home exercise program to facilitate ability to maintain/progress functional gains from skilled physical therapy services. Goal status: on going - assessed  12/15/2021   3. Patient will demonstrate FOTO outcome > or = 56 % to indicate reduced disability due to condition. Goal status: on going - assessed 12/15/2021   4. Patient will demonstrate lumbar extension > or = 75 % WFL s symptoms to facilitate upright standing, walking posture at PLOF s limitation. Goal status: on going - assessed 12/15/2021   5.  Patient will demonstrate thoracic extension 25% WFL to facilitate upright standing/sitting posture.    Goal status:  on going - assessed 12/15/2021   6.  Patient will demonstrate/report ability to perform usual walking, sleeping at PLOF.   Goal status: on going - assessed 12/15/2021    PLAN: PT FREQUENCY: 1-2x/week  PT DURATION: 10 weeks  PLANNED INTERVENTIONS: Therapeutic exercises, Therapeutic activity, Neuro Muscular re-education, Balance training, Gait training, Patient/Family education, Joint mobilization, Stair training, DME instructions, Dry Needling, Electrical stimulation, Cryotherapy, Moist heat, Taping, Ultrasound, Ionotophoresis 84m/ml Dexamethasone, and Manual therapy.  All included unless contraindicated   PLAN FOR NEXT SESSION: how was dry needling?  Progressive mobility improvements.    MScot Jun PT, DPT, OCS, ATC 12/15/21  10:54 AM

## 2021-12-17 ENCOUNTER — Telehealth: Payer: Self-pay | Admitting: Orthopaedic Surgery

## 2021-12-17 NOTE — Telephone Encounter (Signed)
Patient is having knee pain and states his knee is swelling. Would like for someone to call to see if he can get worked in. [phone]

## 2021-12-22 ENCOUNTER — Encounter: Payer: Self-pay | Admitting: Rehabilitative and Restorative Service Providers"

## 2021-12-22 ENCOUNTER — Ambulatory Visit (INDEPENDENT_AMBULATORY_CARE_PROVIDER_SITE_OTHER): Payer: Medicare Other | Admitting: Rehabilitative and Restorative Service Providers"

## 2021-12-22 DIAGNOSIS — M546 Pain in thoracic spine: Secondary | ICD-10-CM | POA: Diagnosis not present

## 2021-12-22 DIAGNOSIS — M5459 Other low back pain: Secondary | ICD-10-CM

## 2021-12-22 DIAGNOSIS — R293 Abnormal posture: Secondary | ICD-10-CM | POA: Diagnosis not present

## 2021-12-22 NOTE — Therapy (Signed)
OUTPATIENT PHYSICAL THERAPY TREATMENT   Patient Name: Miguel [name] MRN: [id number] DOB:21-Apr-1950, 70 y.o., male Today's Date: 12/22/2021  PCP: Alroy Dust L. Dean MD  REFERRING PROVIDER: Lorine Bears, NP  END OF SESSION:   PT End of Session - 12/22/21 1041     Visit Number 7    Number of Visits 20    Date for PT Re-Evaluation 01/25/22    Progress Note Due on Visit 10    PT Start Time 1014    PT Stop Time 1053    PT Time Calculation (min) 39 min    Activity Tolerance Patient tolerated treatment well    Behavior During Therapy WFL for tasks assessed/performed               Past Medical History:  Diagnosis Date   Anemia    Anxiety    BPH (benign prostatic hyperplasia)    Gastritis    GERD (gastroesophageal reflux disease)    "seldom" (07/16/2016)   GI bleed due to NSAIDs 10/27/2015   History of blood transfusion 06/2016   post OR/notes 07/15/2016   History of hiatal hernia    History of kidney stones    surgery to remove stone   Hyperlipidemia    Hypertension    no meds    Melanoma of back ([place])    "mid back"   Sigmoid diverticulitis    with perforation   Past Surgical History:  Procedure Laterality Date   COLON SURGERY     sigmoid   COLOSTOMY TAKEDOWN N/A 06/28/2016   Procedure: COLOSTOMY TAKEDOWN;  Surgeon: [name], MD;  Location: [facility];  Service: General;  Laterality: N/A;   CYSTOSCOPY WITH RETROGRADE PYELOGRAM, URETEROSCOPY AND STENT PLACEMENT Bilateral 02/12/2020   Procedure: CYSTOSCOPY WITH BILATERAL RETROGRADE PYELOGRAM,  AND LITHOPEXY;  Surgeon: Remi Haggard, MD;  Location: [facility];  Service: Urology;  Laterality: Bilateral;  1 HR   ESOPHAGOGASTRODUODENOSCOPY N/A 10/27/2015   Procedure: ESOPHAGOGASTRODUODENOSCOPY (EGD);  Surgeon: Clarene Essex, MD;  Location[facility] ENDOSCOPY;  Service: Endoscopy;  Laterality: N/A;   ESOPHAGOGASTRODUODENOSCOPY N/A 10/29/2015   Procedure: ESOPHAGOGASTRODUODENOSCOPY (EGD);  Surgeon: Ronald Lobo, MD;   Location: [facility] ENDOSCOPY;  Service: Endoscopy;  Laterality: N/A;   ESOPHAGOGASTRODUODENOSCOPY N/A 10/30/2015   Procedure: ESOPHAGOGASTRODUODENOSCOPY (EGD);  Surgeon: Clarene Essex, MD;  Location: [facility] ENDOSCOPY;  Service: Endoscopy;  Laterality: N/A;   g tube discontinued  04/2020   per patient   [place]  11/21/2015   REDUCTION OF HIATAL HERNIA , REPAIR HIATAL HERNIA, RESECTION SMALL BOWEL WITH ANASTOMOSIS, PLACEMENT GASTROSTOMY TUBE, PLACEMENT DUODENOSTOMY TUBE (N/A)   HEMORRHOID BANDING  X 2   HERNIA REPAIR     HIATAL HERNIA REPAIR N/A 11/21/2015   Procedure: REDUCTION OF HIATAL HERNIA , REPAIR HIATAL HERNIA, RESECTION SMALL BOWEL WITH ANASTOMOSIS, PLACEMENT GASTROSTOMY TUBE, PLACEMENT DUODENOSTOMY TUBE;  Surgeon: Mickeal Skinner, MD;  Location: [place];  Service: General;  Laterality: N/A;   HOLMIUM LASER APPLICATION Right [id number]/44/9675   Procedure: HOLMIUM LASER APPLICATION;  Surgeon: Remi Haggard, MD;  Location: [facility];  Service: Urology;  Laterality: Right;   INCISIONAL HERNIA REPAIR  06/28/2016   open/notes 07/15/2016   INCISIONAL HERNIA REPAIR  03/17/2018   WITH MESH   INCISIONAL HERNIA REPAIR N/A 03/17/2018   Procedure: INCISIONAL HERNIA REPAIR WITH MESH;  Surgeon: [name], MD;  Location: [place];  Service: General;  Laterality: N/A;   [place] N/A 10/16/2020   Procedure: INCISIONAL HERNIA REPAIR WITH MESH;  Surgeon:  [name], MD;  Location: [place];  Service: General;  Laterality: N/A;   INGUINAL HERNIA REPAIR Bilateral 09/28/2018   INGUINAL HERNIA REPAIR Bilateral 09/28/2018   Procedure: BILATERAL OPEN INGUINAL HERNIA REPAIR WITH MESH;  Surgeon: [name], MD;  Location: [place];  Service: General;  Laterality: Bilateral;  GENERAL AND TAP BLOCK   INSERTION OF MESH N/A 03/17/2018   Procedure: INSERTION OF MESH;  Surgeon: [name], MD;  Location: [place];  Service: General;  Laterality: N/A;   INSERTION OF MESH Bilateral  09/28/2018   Procedure: Insertion Of Mesh;  Surgeon: [name], MD;  Location: [place];  Service: General;  Laterality: Bilateral;   IR CM INJ ANY COLONIC TUBE W/FLUORO  02/04/2017   IR GUIDED DRAIN W CATHETER PLACEMENT  07/06/2016   /NOTES 07/15/2016   IR PATIENT EVAL TECH 0-60 MINS  06/28/2019   IR RADIOLOGIST EVAL & MGMT  07/27/2016   IR RADIOLOGIST EVAL & MGMT  08/17/2016   IR RADIOLOGIST EVAL & MGMT  08/26/2016   IR REPLACE G-TUBE SIMPLE WO FLUORO  07/28/2017   IR REPLACE G-TUBE SIMPLE WO FLUORO  01/31/2018   IR REPLACE G-TUBE SIMPLE WO FLUORO  10/16/2018   IR REPLACE G-TUBE SIMPLE WO FLUORO  03/05/2019   IR REPLACE G-TUBE SIMPLE WO FLUORO  08/22/2019   IR REPLACE G-TUBE SIMPLE WO FLUORO  01/08/2020   IR [place] GASTRO/COLONIC TUBE PERCUT W/FLUORO  08/04/2016   IR [place] GASTRO/COLONIC TUBE PERCUT W/FLUORO  01/14/2017   IR US GUIDE BX ASP/DRAIN  08/04/2016   KNEE CARTILAGE SURGERY Right 1971   "opened me up"   LAPAROTOMY N/A 07/05/2015   Procedure: PARTIAL SIGMOID COLECTOMY AND COLOSTOMY;  Surgeon: [name], MD;  Location: [place] OR;  Service: General;  Laterality: N/A;   MELANOMA EXCISION  2001   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  10/30/2015   Procedure: REMOVAL OF DISTAL STOMACH;  Surgeon: Judeth Horn, MD;  Location: [place];  Service: General;;   REPAIR OF PERFORATED ULCER N/A 10/30/2015   Procedure: REPAIR OF BLEEDING  ULCER;  Surgeon: Judeth Horn, MD;  Location: [place] OR;  Service: General;  Laterality: N/A;   TUMOR EXCISION  2009   "back; fatty tumor"   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   Bilateral inguinal hernia 09/28/2018   Incisional hernia 03/17/2018   Trigger thumb, left thumb 01/19/2018   Trigger finger, left index finger 07/18/2017   Trigger finger, right ring finger 01/31/2017   Trigger finger of left thumb 01/03/2017   Trigger finger of right thumb 01/03/2017    Trigger index finger of left hand 01/03/2017   Trigger index finger of right hand 01/03/2017   Malnutrition of moderate degree 07/19/2016   Intra-abdominal abscess ([place]) 07/16/2016   Cellulitis 07/15/2016   S/P colostomy takedown 06/28/2016   Pain in thoracic spine 06/09/2016   Mid back pain 06/09/2016   Postoperative fever 11/19/2015   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   Severe protein-calorie malnutrition ([place]) 11/17/2015   Sepsis ([place]) 11/14/2015   Hiccups 11/14/2015   AKI (acute kidney injury) ([place]) 11/14/2015   Fever    Leg swelling    Left shoulder pain    Muscle spasm of left shoulder    GI bleed 10/27/2015   Acute blood loss anemia 10/27/2015   Syncope 10/27/2015   Hyperglycemia 10/27/2015   Hypotension 10/27/2015   Neck  pain 10/27/2015   Hematemesis 10/27/2015   Hematochezia 10/27/2015   Diverticulitis of colon with perforation 07/05/2015    REFERRING DIAG: M54.50,G89.29 (ICD-10-CM) - Chronic left-sided low back pain without sciatica M54.6 (ICD-10-CM) - Pain in thoracic spine M79.18 (ICD-10-CM) - Myofascial pain syndrome G89.4 (ICD-10-CM) - Chronic pain syndrome  Rationale for Evaluation and Treatment Rehabilitation  THERAPY DIAG:  Other low back pain  Pain in thoracic spine  Abnormal posture  ONSET DATE: Chronic (years)  SUBJECTIVE:                                                                                                                                                                                           SUBJECTIVE STATEMENT: Pt indicated feeling better after last visit and "told my wife I felt the best I have in a while."  Reported he was pulled by dog down to knees on ground that resulted in soreness all over and some increase in mid back symptoms since.    PERTINENT HISTORY:  History of skilled PT services until June 2023.  History of trigger point injections and epidural injection.   Had ED visit (not seen) on 11/05/2021 for abdominal  pain as well.   Hyperlipidemia, HTN, GERD in medical history.  History of several abdominal surgeries.   PAIN:  NPRS scale: currently 6/10 Pain location: mid back Pain description: nagging ache Aggravating factors: unsure of activity that consistently worsen.  Relieving factors: rest, medicine   PRECAUTIONS: None  WEIGHT BEARING RESTRICTIONS No  FALLS:  Has patient fallen in last 6 months? No  LIVING ENVIRONMENT: Lives with: lives with their family Lives in: House/apartment Stairs:1 step to enter front, back steps 4-5.   OCCUPATION: Retired  PLOF: Independent, walking dog, general household activity, fishing  PATIENT GOALS have less pain   OBJECTIVE:   Imaging:  11/16/2021:  Thoracic MRI imaging from February exhibits minimal degenerative changes, no nerve impingement, no stenosis. Lumbar MRI imaging from 2022 exhibits multilevel facet arthropathy and broad-based disc protrusion contributing to mild foraminal narrowing bilaterally, left greater than right. No high grade spinal canal stenosis  PATIENT SURVEYS:  11/26/2021 FOTO: 50   11/16/2021 FOTO intake:  49  predicted:  56   SCREENING FOR RED FLAGS: 11/16/2021 Bowel or bladder incontinence: No Cauda equina syndrome: No  COGNITION: 11/16/2021  Overall cognitive status: WFL normal    SENSATION: 11/16/2021 [facility]  MUSCLE LENGTH: 11/16/2021  None checked today  POSTURE:  11/16/2021 Lt shoulder higher than Rt in sitting/standing, Mild Lt lateral shift of trunk, increased thoracic kyphosis, reduced lumbar lordosis in standing.   PALPATION: 11/16/2021 Lumbar paraspinal tenderness bilateral  Lt >  Rt.  Lt QL tenderness  Back/ LUMBAR ROM:   AROM  AROM  11/16/2021 AROM 11/26/2021 AROM 12/11/21  Lumbar Flexion To mid shin no complaints.  To mid shin no complaints.  To mid shin with pulling stretch in middle back   Lumbar Extension < 25 % c ERP 75% no complaints 75% no complaints  Lumbar Right lateral flexion To mid  thigh c pain To mid thigh with no complaints To mid thigh with no complaints  Lumbar Left lateral flexion To mid thigh c pain To lateral knee with no complaints To mid thigh with no complaints       Thoracic extension Unable to reach neutral  Unable to reach neutral, feeling pulling/knot in medial Rt scap Unable to reach neutral  Thoracic flexion < 25 % 50% 50%  Lt thoracic rotation 35 35 36  Rt thoracic rotation 40 36 33   (Blank rows = not tested)  LOWER EXTREMITY ROM:      Right 11/16/2021 Left 11/16/2021  Hip flexion    Hip extension    Hip abduction    Hip adduction    Hip internal rotation    Hip external rotation    Knee flexion    Knee extension    Ankle dorsiflexion    Ankle plantarflexion    Ankle inversion    Ankle eversion     (Blank rows = not tested)  LOWER EXTREMITY MMT:    MMT Right 11/16/2021 Left 11/16/2021  Hip flexion    Hip extension    Hip abduction    Hip adduction    Hip internal rotation    Hip external rotation    Knee flexion    Knee extension    Ankle dorsiflexion    Ankle plantarflexion             (Blank rows = not tested) 11/16/2021:  General postural strength deficits as observed by inability to hold postural corrections.   LUMBAR SPECIAL TESTS:  11/16/2021 (-) slump bilateral.  General PAIVM hypomobility noted throughout  FUNCTIONAL TESTS:  11/16/2021 None performed today  GAIT: 11/16/2021 Independent  TODAY'S TREATMENT  12/22/2021:  Therex:  Nustep lvl 5 10 mins UE/LE Green tband rows standing 2 x 15 bilateral Green tband gh ext standing 2 x 15 bilateral Corner chest/shoulder stretch 15 sec x 3 Wall slides UE in to flexion c step in and focus cervical/thoracic extension x 10 Standing thoracic rotation/horizontal abduction ROM x 10 bilateral Seated thoracic extension over 1/2 foam in chair x 15  12/15/2021:  Manual: Skilled palpation c Dry needling.  Prone g3 cPA T3-T9 for mobility gains.   Dry needling: Verbal consent  by Pt given.  Twitch response c concordant symptoms in area at multifidi T 6-T8.  Handout provided for education.    Therex:  Prone scapular retraction hold 5 sec x 10 Standing thoracic rotation at wall x 10 bilateral Tband rows green band 2 x 15  Tband gh ext green c pause in end range at legs 2 x 15  12/11/2021: Therex: UBE UE/LE fwd lvl 1 6.5 mins slow revolutions, UE only backward 3 mins lvl 1 Seated thoracic extension in chair 2-3 sec hold x 15 with cues for forward head facing STS x 10 to standard chair height with cues for controlled eccentric, hands on knees Seated bilateral scapular retraction c bilateral UE ER 2-3 sec hold x 10  Seated bil scap retraction with shoulder abduction and elbows bent (like field goal position) x  10 Seated SLR x 10 bil. Standing thoracic rotation at wall x 10 bilateral    PATIENT EDUCATION:  Education details: HEP progression Person educated: Patient Education method: Consulting civil engineer, Demonstration, Verbal cues, and Handouts Education comprehension: verbalized understanding, returned demonstration, and verbal cues required   HOME EXERCISE PROGRAM: Access Code: [email or web address] URL: [email or web address] Date: 12/11/2021 Prepared by: Scot Jun  Exercises - Shoulder External Rotation and Scapular Retraction  - 2-3 x daily - 7 x weekly - 1 sets - 10 reps - 2 hold - Seated Thoracic Lumbar Extension  - 2-3 x daily - 7 x weekly - 1 sets - 10 reps - 3-5 hold - Supine Lower Trunk Rotation  - 2-3 x daily - 7 x weekly - 1 sets - 3-5 reps - 15 hold - Standing Thoracic Rotation at Wall  - 2-3 x daily - 7 x weekly - 1 sets - 8-10 reps - 3-5 hold - Standing Lumbar Extension  - 2-3 x daily - 7 x weekly - 1 sets - 5-10 reps - Seated Multifidi Isometric  - 2-3 x daily - 7 x weekly - 1 sets - 10 reps - 5-10 hold - Seated Abdominal Press into [employer]  - 2-3 x daily - 7 x weekly - 1 sets - 10 reps - 5-10 hold - Sit to Stand  - 3 x daily - 7 x weekly -  1 sets - 10 reps  ASSESSMENT:  CLINICAL IMPRESSION: Arrival today with after effect complaints from incident when pulled down with dog.  General mobility intervention today in clinic helped reduced arrival complaints severity to the positive. Pt to continue to benefit from efforts to improve upper to mid thoracic mobility in functional activity to help contribute to reduced symptoms.  Dry needling response seemed favorable prior to incident with dog and may be used in future.   OBJECTIVE IMPAIRMENTS Abnormal gait, decreased activity tolerance, decreased endurance, decreased mobility, difficulty walking, decreased ROM, decreased strength, increased fascial restrictions, impaired perceived functional ability, increased muscle spasms, impaired flexibility, improper body mechanics, postural dysfunction, and pain.   ACTIVITY LIMITATIONS carrying, lifting, bending, standing, sleeping, and locomotion level  PARTICIPATION LIMITATIONS: cleaning, interpersonal relationship, community activity, yard work, and housework  PERSONAL FACTORS  Hyperlipidemia, HTN, GERD, time since onset of symptoms  are also affecting patient's functional outcome.   REHAB POTENTIAL: Good  CLINICAL DECISION MAKING: Stable/uncomplicated  EVALUATION COMPLEXITY: Low   GOALS: Goals reviewed with patient? Yes  Short term PT Goals (target date for Short term goals are 3 weeks 12/07/2021) Patient will demonstrate independent use of home exercise program to maintain progress from in clinic treatments. Goal status: Met    Long term PT goals (target dates for all long term goals are 10 weeks  01/25/2022 )   1. Patient will demonstrate/report pain at worst less than or equal to 2/10 to facilitate minimal limitation in daily activity secondary to pain symptoms. Goal status: on going - assessed 12/15/2021   2. Patient will demonstrate independent use of home exercise program to facilitate ability to maintain/progress functional  gains from skilled physical therapy services. Goal status: on going - assessed 12/15/2021   3. Patient will demonstrate FOTO outcome > or = 56 % to indicate reduced disability due to condition. Goal status: on going - assessed 12/15/2021   4. Patient will demonstrate lumbar extension > or = 75 % WFL s symptoms to facilitate upright standing, walking posture at PLOF s limitation. Goal status: on going - assessed 12/15/2021  5.  Patient will demonstrate thoracic extension 25% WFL to facilitate upright standing/sitting posture.    Goal status: on going - assessed 12/15/2021   6.  Patient will demonstrate/report ability to perform usual walking, sleeping at PLOF.   Goal status: on going - assessed 12/15/2021    PLAN: PT FREQUENCY: 1-2x/week  PT DURATION: 10 weeks  PLANNED INTERVENTIONS: Therapeutic exercises, Therapeutic activity, Neuro Muscular re-education, Balance training, Gait training, Patient/Family education, Joint mobilization, Stair training, DME instructions, Dry Needling, Electrical stimulation, Cryotherapy, Moist heat, Taping, Ultrasound, Ionotophoresis 21m/ml Dexamethasone, and Manual therapy.  All included unless contraindicated   PLAN FOR NEXT SESSION: Dry needling prn, progressive strengthening/postural improvement continued.  FMarya Landry PT, DPT, OCS, ATC 12/22/21  10:46 AM

## 2021-12-29 ENCOUNTER — Ambulatory Visit (INDEPENDENT_AMBULATORY_CARE_PROVIDER_SITE_OTHER): Payer: Medicare Other | Admitting: Physical Therapy

## 2021-12-29 ENCOUNTER — Encounter: Payer: Self-pay | Admitting: Physical Therapy

## 2021-12-29 DIAGNOSIS — R293 Abnormal posture: Secondary | ICD-10-CM

## 2021-12-29 DIAGNOSIS — M5459 Other low back pain: Secondary | ICD-10-CM

## 2021-12-29 DIAGNOSIS — M546 Pain in thoracic spine: Secondary | ICD-10-CM | POA: Diagnosis not present

## 2021-12-29 NOTE — Therapy (Addendum)
OUTPATIENT PHYSICAL THERAPY TREATMENT   Patient Name: Miguel [name] MRN: [id number] DOB:03/19/1951, 70 y.o., male Today's Date: 12/29/2021  PCP: Mitchell L. Dean MD  REFERRING PROVIDER: [name], [name], NP  END OF SESSION:   PT End of Session - 12/29/21 1137     Visit Number 8    Number of Visits 20    Date for PT Re-Evaluation 01/25/22    Progress Note Due on Visit 10    PT Start Time 1100    PT Stop Time 1145    PT Time Calculation (min) 45 min    Activity Tolerance Patient tolerated treatment well    Behavior During Therapy WFL for tasks assessed/performed                Past Medical History:  Diagnosis Date   Anemia    Anxiety    BPH (benign prostatic hyperplasia)    Gastritis    GERD (gastroesophageal reflux disease)    "seldom" (07/16/2016)   GI bleed due to NSAIDs 10/27/2015   History of blood transfusion 06/2016   post OR/notes 07/15/2016   History of hiatal hernia    History of kidney stones    surgery to remove stone   Hyperlipidemia    Hypertension    no meds    Melanoma of back (HCC)    "mid back"   Sigmoid diverticulitis    with perforation   Past Surgical History:  Procedure Laterality Date   COLON SURGERY     sigmoid   COLOSTOMY TAKEDOWN N/A 06/28/2016   Procedure: COLOSTOMY TAKEDOWN;  Surgeon: Douglas Blackman, MD;  [facility]: [facility];  Service: General;  Laterality: N/A;   CYSTOSCOPY WITH RETROGRADE PYELOGRAM, URETEROSCOPY AND STENT PLACEMENT Bilateral 02/12/2020   Procedure: CYSTOSCOPY WITH BILATERAL RETROGRADE PYELOGRAM,  AND LITHOPEXY;  Surgeon: Newsome, George B, MD;  Location: [facility];  Service: Urology;  Laterality: Bilateral;  1 HR   ESOPHAGOGASTRODUODENOSCOPY N/A 10/27/2015   Procedure: ESOPHAGOGASTRODUODENOSCOPY (EGD);  Surgeon: Marc Magod, MD;  [facility];  Service: Endoscopy;  Laterality: N/A;   ESOPHAGOGASTRODUODENOSCOPY N/A 10/29/2015   Procedure: ESOPHAGOGASTRODUODENOSCOPY (EGD);  Surgeon: Robert Buccini, MD;   [facility];  Service: Endoscopy;  Laterality: N/A;   ESOPHAGOGASTRODUODENOSCOPY N/A 10/30/2015   Procedure: ESOPHAGOGASTRODUODENOSCOPY (EGD);  Surgeon: Marc Magod, MD;  [facility];  Service: Endoscopy;  Laterality: N/A;   g tube discontinued  04/2020   per patient   GASTROSTOMY TUBE PLACEMENT  11/21/2015   REDUCTION OF HIATAL HERNIA , REPAIR HIATAL HERNIA, RESECTION SMALL BOWEL WITH ANASTOMOSIS, PLACEMENT GASTROSTOMY TUBE, PLACEMENT DUODENOSTOMY TUBE (N/A)   HEMORRHOID BANDING  X 2   HERNIA REPAIR     HIATAL HERNIA REPAIR N/A 11/21/2015   Procedure: REDUCTION OF HIATAL HERNIA , REPAIR HIATAL HERNIA, RESECTION SMALL BOWEL WITH ANASTOMOSIS, PLACEMENT GASTROSTOMY TUBE, PLACEMENT DUODENOSTOMY TUBE;  Surgeon: Luke Aaron Kinsinger, MD;  [facility];  Service: General;  Laterality: N/A;   HOLMIUM LASER APPLICATION Right 02/12/2020   Procedure: HOLMIUM LASER APPLICATION;  Surgeon: Newsome, George B, MD;  Location: [facility];  Service: Urology;  Laterality: Right;   INCISIONAL HERNIA REPAIR  06/28/2016   open/notes 07/15/2016   INCISIONAL HERNIA REPAIR  03/17/2018   WITH MESH   INCISIONAL HERNIA REPAIR N/A 03/17/2018   Procedure: INCISIONAL HERNIA REPAIR WITH MESH;  Surgeon: Blackman, Douglas, MD;  [facility];  Service: General;  Laterality: N/A;   INCISIONAL HERNIA REPAIR N/A 10/16/2020   Procedure: INCISIONAL HERNIA REPAIR WITH MESH;    Surgeon: [name], MD;  Location: [place];  Service: General;  Laterality: N/A;   INGUINAL HERNIA REPAIR Bilateral 09/28/2018   INGUINAL HERNIA REPAIR Bilateral 09/28/2018   Procedure: BILATERAL OPEN INGUINAL HERNIA REPAIR WITH MESH;  Surgeon: [name], MD;  Location: [place];  Service: General;  Laterality: Bilateral;  GENERAL AND TAP BLOCK   INSERTION OF MESH N/A 03/17/2018   Procedure: INSERTION OF MESH;  Surgeon: [name], MD;  Location: [place];  Service: General;  Laterality: N/A;   INSERTION OF MESH Bilateral  09/28/2018   Procedure: Insertion Of Mesh;  Surgeon: [name], MD;  Location: [place];  Service: General;  Laterality: Bilateral;   IR CM INJ ANY COLONIC TUBE W/FLUORO  02/04/2017   IR GUIDED DRAIN W CATHETER PLACEMENT  07/06/2016   /NOTES 07/15/2016   IR PATIENT EVAL TECH 0-60 MINS  06/28/2019   IR RADIOLOGIST EVAL & MGMT  07/27/2016   IR RADIOLOGIST EVAL & MGMT  08/17/2016   IR RADIOLOGIST EVAL & MGMT  08/26/2016   IR REPLACE G-TUBE SIMPLE WO FLUORO  07/28/2017   IR REPLACE G-TUBE SIMPLE WO FLUORO  01/31/2018   IR REPLACE G-TUBE SIMPLE WO FLUORO  10/16/2018   IR REPLACE G-TUBE SIMPLE WO FLUORO  03/05/2019   IR REPLACE G-TUBE SIMPLE WO FLUORO  08/22/2019   IR REPLACE G-TUBE SIMPLE WO FLUORO  01/08/2020   IR [place] GASTRO/COLONIC TUBE PERCUT W/FLUORO  08/04/2016   IR [place] GASTRO/COLONIC TUBE PERCUT W/FLUORO  01/14/2017   IR US GUIDE BX ASP/DRAIN  08/04/2016   KNEE CARTILAGE SURGERY Right 1971   "opened me up"   LAPAROTOMY N/A 07/05/2015   Procedure: PARTIAL SIGMOID COLECTOMY AND COLOSTOMY;  Surgeon: [name], MD;  Location: [facility];  Service: General;  Laterality: N/A;   MELANOMA EXCISION  2001   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  10/30/2015   Procedure: REMOVAL OF DISTAL STOMACH;  Surgeon: Judeth Horn, MD;  Location: [place];  Service: General;;   REPAIR OF PERFORATED ULCER N/A 10/30/2015   Procedure: REPAIR OF BLEEDING  ULCER;  Surgeon: Judeth Horn, MD;  Location: [facility];  Service: General;  Laterality: N/A;   TUMOR EXCISION  2009   "back; fatty tumor"   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   Bilateral inguinal hernia 09/28/2018   Incisional hernia 03/17/2018   Trigger thumb, left thumb 01/19/2018   Trigger finger, left index finger 07/18/2017   Trigger finger, right ring finger 01/31/2017   Trigger finger of left thumb 01/03/2017   Trigger finger of right thumb 01/03/2017    Trigger index finger of left hand 01/03/2017   Trigger index finger of right hand 01/03/2017   Malnutrition of moderate degree 07/19/2016   Intra-abdominal abscess ([place]) 07/16/2016   Cellulitis 07/15/2016   S/P colostomy takedown 06/28/2016   Pain in thoracic spine 06/09/2016   Mid back pain 06/09/2016   Postoperative fever 11/19/2015   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   Severe protein-calorie malnutrition ([place]) 11/17/2015   Sepsis ([place]) 11/14/2015   Hiccups 11/14/2015   AKI (acute kidney injury) ([place]) 11/14/2015   Fever    Leg swelling    Left shoulder pain    Muscle spasm of left shoulder    GI bleed 10/27/2015   Acute blood loss anemia 10/27/2015   Syncope 10/27/2015   Hyperglycemia 10/27/2015   Hypotension 10/27/2015  Neck pain 10/27/2015   Hematemesis 10/27/2015   Hematochezia 10/27/2015   Diverticulitis of colon with perforation 07/05/2015    REFERRING DIAG: M54.50,G89.29 (ICD-10-CM) - Chronic left-sided low back pain without sciatica M54.6 (ICD-10-CM) - Pain in thoracic spine M79.18 (ICD-10-CM) - Myofascial pain syndrome G89.4 (ICD-10-CM) - Chronic pain syndrome  Rationale for Evaluation and Treatment Rehabilitation  THERAPY DIAG:  Other low back pain  Pain in thoracic spine  Abnormal posture  ONSET DATE: Chronic (years)  SUBJECTIVE:                                                                                                                                                                                           SUBJECTIVE STATEMENT: Pt arriving today reporting stiffness in his neck and thoracic spine. Pt reporting 9/10 pain.   PERTINENT HISTORY:  History of skilled PT services until June 2023.  History of trigger point injections and epidural injection.   Had ED visit (not seen) on 11/05/2021 for abdominal pain as well.   Hyperlipidemia, HTN, GERD in medical history.  History of several abdominal surgeries.   PAIN:  NPRS scale: currently 9/10  upper thoracic and left shoulder Pain location: see above Pain description: nagging ache Aggravating factors: unsure of activity that consistently worsen.  Relieving factors: rest, medicine   PRECAUTIONS: None  WEIGHT BEARING RESTRICTIONS No  FALLS:  Has patient fallen in last 6 months? No  LIVING ENVIRONMENT: Lives with: lives with their family Lives in: House/apartment Stairs:1 step to enter front, back steps 4-5.   OCCUPATION: Retired  PLOF: Independent, walking dog, general household activity, fishing  PATIENT GOALS have less pain   OBJECTIVE:   Imaging:  11/16/2021:  Thoracic MRI imaging from February exhibits minimal degenerative changes, no nerve impingement, no stenosis. Lumbar MRI imaging from 2022 exhibits multilevel facet arthropathy and broad-based disc protrusion contributing to mild foraminal narrowing bilaterally, left greater than right. No high grade spinal canal stenosis  PATIENT SURVEYS: \ 12/29/21:   FOTO: 50%  11/26/2021 FOTO: 50   11/16/2021 FOTO intake:  49  predicted:  56    SCREENING FOR RED FLAGS: 11/16/2021 Bowel or bladder incontinence: No Cauda equina syndrome: No  COGNITION: 11/16/2021  Overall cognitive status: WFL normal    SENSATION: 11/16/2021 WFL  MUSCLE LENGTH: 11/16/2021  None checked today  POSTURE:  11/16/2021 Lt shoulder higher than Rt in sitting/standing, Mild Lt lateral shift of trunk, increased thoracic kyphosis, reduced lumbar lordosis in standing.   PALPATION: 11/16/2021 Lumbar paraspinal tenderness bilateral  Lt > Rt.  Lt QL tenderness  Back/ LUMBAR ROM:   AROM  AROM  11/16/2021 AROM 11/26/2021   AROM 12/11/21  Lumbar Flexion To mid shin no complaints.  To mid shin no complaints.  To mid shin with pulling stretch in middle back   Lumbar Extension < 25 % c ERP 75% no complaints 75% no complaints  Lumbar Right lateral flexion To mid thigh c pain To mid thigh with no complaints To mid thigh with no complaints  Lumbar  Left lateral flexion To mid thigh c pain To lateral knee with no complaints To mid thigh with no complaints       Thoracic extension Unable to reach neutral  Unable to reach neutral, feeling pulling/knot in medial Rt scap Unable to reach neutral  Thoracic flexion < 25 % 50% 50%  Lt thoracic rotation 35 35 36  Rt thoracic rotation 40 36 33   (Blank rows = not tested)  LOWER EXTREMITY ROM:      Right 11/16/2021 Left 11/16/2021  Hip flexion    Hip extension    Hip abduction    Hip adduction    Hip internal rotation    Hip external rotation    Knee flexion    Knee extension    Ankle dorsiflexion    Ankle plantarflexion    Ankle inversion    Ankle eversion     (Blank rows = not tested)  LOWER EXTREMITY MMT:    MMT Right 11/16/2021 Left 11/16/2021  Hip flexion    Hip extension    Hip abduction    Hip adduction    Hip internal rotation    Hip external rotation    Knee flexion    Knee extension    Ankle dorsiflexion    Ankle plantarflexion             (Blank rows = not tested) 11/16/2021:  General postural strength deficits as observed by inability to hold postural corrections.   LUMBAR SPECIAL TESTS:  11/16/2021 (-) slump bilateral.  General PAIVM hypomobility noted throughout  FUNCTIONAL TESTS:  11/16/2021 None performed today  GAIT: 11/16/2021 Independent  TODAY'S TREATMENT  12/29/2021:  Therex:  Supine trunk rotation x 3 holding 30 seconds Supine c towel roll placed vertical and horizontal for thoracic extension holding 2 minute each Green tband rows standing 2 x 15 bilateral Green tband gh ext standing 2 x 15 bilateral Corner chest/shoulder stretch 15 sec x 3 Manual STM c active movement stretch techniques for trigger point release in left upper trap and levator using Biofreeze.  Modalities" Moist heat to cervical, thoracic and lumbar spine x 5 minutes    12/22/2021:  Therex:  Nustep lvl 5 10 mins UE/LE Green tband rows standing 2 x 15 bilateral Green  tband gh ext standing 2 x 15 bilateral Corner chest/shoulder stretch 15 sec x 3 Wall slides UE in to flexion c step in and focus cervical/thoracic extension x 10 Standing thoracic rotation/horizontal abduction ROM x 10 bilateral Seated thoracic extension over 1/2 foam in chair x 15  12/15/2021:  Manual: Skilled palpation c Dry needling.  Prone g3 cPA T3-T9 for mobility gains.   Dry needling: Verbal consent by Pt given.  Twitch response c concordant symptoms in area at multifidi T 6-T8.  Handout provided for education.    Therex:  Prone scapular retraction hold 5 sec x 10 Standing thoracic rotation at wall x 10 bilateral Tband rows green band 2 x 15  Tband gh ext green c pause in end range at legs 2 x 15      PATIENT EDUCATION:  Education details: HEP  progression Person educated: Patient Education method: Explanation, Demonstration, Verbal cues, and Handouts Education comprehension: verbalized understanding, returned demonstration, and verbal cues required   HOME EXERCISE PROGRAM: Access Code: [email or web address] URL: [email or web address] Date: 12/11/2021 Prepared by: Michael Wright  Exercises - Shoulder External Rotation and Scapular Retraction  - 2-3 x daily - 7 x weekly - 1 sets - 10 reps - 2 hold - Seated Thoracic Lumbar Extension  - 2-3 x daily - 7 x weekly - 1 sets - 10 reps - 3-5 hold - Supine Lower Trunk Rotation  - 2-3 x daily - 7 x weekly - 1 sets - 3-5 reps - 15 hold - Standing Thoracic Rotation at Wall  - 2-3 x daily - 7 x weekly - 1 sets - 8-10 reps - 3-5 hold - Standing Lumbar Extension  - 2-3 x daily - 7 x weekly - 1 sets - 5-10 reps - Seated Multifidi Isometric  - 2-3 x daily - 7 x weekly - 1 sets - 10 reps - 5-10 hold - Seated Abdominal Press into Swiss Ball  - 2-3 x daily - 7 x weekly - 1 sets - 10 reps - 5-10 hold - Sit to Stand  - 3 x daily - 7 x weekly - 1 sets - 10 reps  ASSESSMENT:  CLINICAL IMPRESSION: Pt arriving today with "catch" in his  left upper trap. STM using Biofreeze c  active movement stretching for upper trap and levator. Still encouraging thoracic mobility as tolerated for postural support. Pt reporting pain decreased by end of session. Continue skilled PT to maximize pt's function.    OBJECTIVE IMPAIRMENTS Abnormal gait, decreased activity tolerance, decreased endurance, decreased mobility, difficulty walking, decreased ROM, decreased strength, increased fascial restrictions, impaired perceived functional ability, increased muscle spasms, impaired flexibility, improper body mechanics, postural dysfunction, and pain.   ACTIVITY LIMITATIONS carrying, lifting, bending, standing, sleeping, and locomotion level  PARTICIPATION LIMITATIONS: cleaning, interpersonal relationship, community activity, yard work, and housework  PERSONAL FACTORS  Hyperlipidemia, HTN, GERD, time since onset of symptoms  are also affecting patient's functional outcome.   REHAB POTENTIAL: Good  CLINICAL DECISION MAKING: Stable/uncomplicated  EVALUATION COMPLEXITY: Low   GOALS: Goals reviewed with patient? Yes  Short term PT Goals (target date for Short term goals are 3 weeks 12/07/2021) Patient will demonstrate independent use of home exercise program to maintain progress from in clinic treatments. Goal status: Met    Long term PT goals (target dates for all long term goals are 10 weeks  01/25/2022 )   1. Patient will demonstrate/report pain at worst less than or equal to 2/10 to facilitate minimal limitation in daily activity secondary to pain symptoms. Goal status: on going - assessed 12/15/2021   2. Patient will demonstrate independent use of home exercise program to facilitate ability to maintain/progress functional gains from skilled physical therapy services. Goal status: on going - assessed 12/15/2021   3. Patient will demonstrate FOTO outcome > or = 56 % to indicate reduced disability due to condition. Goal status: on going -  assessed 12/28/2021   4. Patient will demonstrate lumbar extension > or = 75 % WFL s symptoms to facilitate upright standing, walking posture at PLOF s limitation. Goal status: on going - assessed 12/15/2021   5.  Patient will demonstrate thoracic extension 25% WFL to facilitate upright standing/sitting posture.    Goal status: on going - assessed 12/15/2021   6.  Patient will demonstrate/report ability to perform usual walking, sleeping at PLOF.   Goal   status: on going - assessed 12/15/2021    PLAN: PT FREQUENCY: 1-2x/week  PT DURATION: 10 weeks  PLANNED INTERVENTIONS: Therapeutic exercises, Therapeutic activity, Neuro Muscular re-education, Balance training, Gait training, Patient/Family education, Joint mobilization, Stair training, DME instructions, Dry Needling, Electrical stimulation, Cryotherapy, Moist heat, Taping, Ultrasound, Ionotophoresis 4mg/ml Dexamethasone, and Manual therapy.  All included unless contraindicated   PLAN FOR NEXT SESSION: Consider DN again if pt's symptoms have not improved, progressive strengthening/postural improvement continued.     , PT, MPT 12/29/21 11:38 AM   12/29/21  11:38 AM 

## 2022-01-05 ENCOUNTER — Ambulatory Visit (INDEPENDENT_AMBULATORY_CARE_PROVIDER_SITE_OTHER): Payer: Medicare Other | Admitting: Rehabilitative and Restorative Service Providers"

## 2022-01-05 ENCOUNTER — Encounter: Payer: Self-pay | Admitting: Rehabilitative and Restorative Service Providers"

## 2022-01-05 DIAGNOSIS — M5459 Other low back pain: Secondary | ICD-10-CM | POA: Diagnosis not present

## 2022-01-05 DIAGNOSIS — R293 Abnormal posture: Secondary | ICD-10-CM | POA: Diagnosis not present

## 2022-01-05 DIAGNOSIS — M546 Pain in thoracic spine: Secondary | ICD-10-CM | POA: Diagnosis not present

## 2022-01-05 NOTE — Therapy (Signed)
OUTPATIENT PHYSICAL THERAPY TREATMENT   Patient Name: Miguel [name] MRN: [id number] DOB:Jul 17, 1950, 71 y.o., male Today's Date: 01/05/2022  PCP: Alroy Dust L. Dean MD  REFERRING PROVIDER: Lorine Bears, NP  END OF SESSION:   PT End of Session - 01/05/22 1024     Visit Number 9    Number of Visits 20    Date for PT Re-Evaluation 01/25/22    Progress Note Due on Visit 10    PT Start Time 1018    PT Stop Time 1057    PT Time Calculation (min) 39 min    Activity Tolerance Patient tolerated treatment well    Behavior During Therapy WFL for tasks assessed/performed                 Past Medical History:  Diagnosis Date   Anemia    Anxiety    BPH (benign prostatic hyperplasia)    Gastritis    GERD (gastroesophageal reflux disease)    "seldom" (07/16/2016)   GI bleed due to NSAIDs 10/27/2015   History of blood transfusion 06/2016   post OR/notes 07/15/2016   History of hiatal hernia    History of kidney stones    surgery to remove stone   Hyperlipidemia    Hypertension    no meds    Melanoma of back ([place])    "mid back"   Sigmoid diverticulitis    with perforation   Past Surgical History:  Procedure Laterality Date   COLON SURGERY     sigmoid   COLOSTOMY TAKEDOWN N/A 06/28/2016   Procedure: COLOSTOMY TAKEDOWN;  Surgeon: [name], MD;  Location: [place];  Service: General;  Laterality: N/A;   CYSTOSCOPY WITH RETROGRADE PYELOGRAM, URETEROSCOPY AND STENT PLACEMENT Bilateral 02/12/2020   Procedure: CYSTOSCOPY WITH BILATERAL RETROGRADE PYELOGRAM,  AND LITHOPEXY;  Surgeon: Remi Haggard, MD;  Location: [facility];  Service: Urology;  Laterality: Bilateral;  1 HR   ESOPHAGOGASTRODUODENOSCOPY N/A 10/27/2015   Procedure: ESOPHAGOGASTRODUODENOSCOPY (EGD);  Surgeon: Clarene Essex, MD;  Location: [facility] ENDOSCOPY;  Service: Endoscopy;  Laterality: N/A;   ESOPHAGOGASTRODUODENOSCOPY N/A 10/29/2015   Procedure: ESOPHAGOGASTRODUODENOSCOPY (EGD);  Surgeon: Ronald Lobo, MD;   Location: [facility] ENDOSCOPY;  Service: Endoscopy;  Laterality: N/A;   ESOPHAGOGASTRODUODENOSCOPY N/A 10/30/2015   Procedure: ESOPHAGOGASTRODUODENOSCOPY (EGD);  Surgeon: Clarene Essex, MD;  Location: [facility] ENDOSCOPY;  Service: Endoscopy;  Laterality: N/A;   g tube discontinued  04/2020   per patient   [place]  11/21/2015   REDUCTION OF HIATAL HERNIA , REPAIR HIATAL HERNIA, RESECTION SMALL BOWEL WITH ANASTOMOSIS, PLACEMENT GASTROSTOMY TUBE, PLACEMENT DUODENOSTOMY TUBE (N/A)   HEMORRHOID BANDING  X 2   HERNIA REPAIR     HIATAL HERNIA REPAIR N/A 11/21/2015   Procedure: REDUCTION OF HIATAL HERNIA , REPAIR HIATAL HERNIA, RESECTION SMALL BOWEL WITH ANASTOMOSIS, PLACEMENT GASTROSTOMY TUBE, PLACEMENT DUODENOSTOMY TUBE;  Surgeon: Mickeal Skinner, MD;  Location: [place];  Service: General;  Laterality: N/A;   HOLMIUM LASER APPLICATION Right 13/10/6576   Procedure: HOLMIUM LASER APPLICATION;  Surgeon: Remi Haggard, MD;  Location: [facility];  Service: Urology;  Laterality: Right;   INCISIONAL HERNIA REPAIR  06/28/2016   open/notes 07/15/2016   INCISIONAL HERNIA REPAIR  03/17/2018   WITH MESH   INCISIONAL HERNIA REPAIR N/A 03/17/2018   Procedure: INCISIONAL HERNIA REPAIR WITH MESH;  Surgeon: [name], MD;  Location: [place];  Service: General;  Laterality: N/A;   [place] N/A 10/16/2020   Procedure: INCISIONAL HERNIA REPAIR WITH MESH;  Surgeon: [name], MD;  Location: [place];  Service: General;  Laterality: N/A;   INGUINAL HERNIA REPAIR Bilateral 09/28/2018   INGUINAL HERNIA REPAIR Bilateral 09/28/2018   Procedure: BILATERAL OPEN INGUINAL HERNIA REPAIR WITH MESH;  Surgeon: [name], MD;  Location: [place];  Service: General;  Laterality: Bilateral;  GENERAL AND TAP BLOCK   INSERTION OF MESH N/A 03/17/2018   Procedure: INSERTION OF MESH;  Surgeon: [name], MD;  Location: [place];  Service: General;  Laterality: N/A;   INSERTION OF MESH Bilateral  09/28/2018   Procedure: Insertion Of Mesh;  Surgeon: [name], MD;  Location: [place];  Service: General;  Laterality: Bilateral;   IR CM INJ ANY COLONIC TUBE W/FLUORO  02/04/2017   IR GUIDED DRAIN W CATHETER PLACEMENT  07/06/2016   /NOTES 07/15/2016   IR PATIENT EVAL TECH 0-60 MINS  06/28/2019   IR RADIOLOGIST EVAL & MGMT  07/27/2016   IR RADIOLOGIST EVAL & MGMT  08/17/2016   IR RADIOLOGIST EVAL & MGMT  08/26/2016   IR REPLACE G-TUBE SIMPLE WO FLUORO  07/28/2017   IR REPLACE G-TUBE SIMPLE WO FLUORO  01/31/2018   IR REPLACE G-TUBE SIMPLE WO FLUORO  10/16/2018   IR REPLACE G-TUBE SIMPLE WO FLUORO  03/05/2019   IR REPLACE G-TUBE SIMPLE WO FLUORO  08/22/2019   IR REPLACE G-TUBE SIMPLE WO FLUORO  01/08/2020   IR [place] GASTRO/COLONIC TUBE PERCUT W/FLUORO  08/04/2016   IR [place] GASTRO/COLONIC TUBE PERCUT W/FLUORO  01/14/2017   IR US GUIDE BX ASP/DRAIN  08/04/2016   KNEE CARTILAGE SURGERY Right 1971   "opened me up"   LAPAROTOMY N/A 07/05/2015   Procedure: PARTIAL SIGMOID COLECTOMY AND COLOSTOMY;  Surgeon: [name], MD;  Location: [place] [facility];  Service: General;  Laterality: N/A;   MELANOMA EXCISION  2001   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  10/30/2015   Procedure: REMOVAL OF DISTAL STOMACH;  Surgeon: Judeth Horn, MD;  Location: [place];  Service: General;;   REPAIR OF PERFORATED ULCER N/A 10/30/2015   Procedure: REPAIR OF BLEEDING  ULCER;  Surgeon: Judeth Horn, MD;  Location: [place] [facility];  Service: General;  Laterality: N/A;   TUMOR EXCISION  2009   "back; fatty tumor"   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   Bilateral inguinal hernia 09/28/2018   Incisional hernia 03/17/2018   Trigger thumb, left thumb 01/19/2018   Trigger finger, left index finger 07/18/2017   Trigger finger, right ring finger 01/31/2017   Trigger finger of left thumb 01/03/2017   Trigger finger of right thumb 01/03/2017    Trigger index finger of left hand 01/03/2017   Trigger index finger of right hand 01/03/2017   Malnutrition of moderate degree 07/19/2016   Intra-abdominal abscess ([place]) 07/16/2016   Cellulitis 07/15/2016   S/P colostomy takedown 06/28/2016   Pain in thoracic spine 06/09/2016   Mid back pain 06/09/2016   Postoperative fever 11/19/2015   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   Severe protein-calorie malnutrition ([place]) 11/17/2015   Sepsis ([place]) 11/14/2015   Hiccups 11/14/2015   AKI (acute kidney injury) ([place]) 11/14/2015   Fever    Leg swelling    Left shoulder pain    Muscle spasm of left shoulder    GI bleed 10/27/2015   Acute blood loss anemia 10/27/2015   Syncope 10/27/2015   Hyperglycemia 10/27/2015   Hypotension 10/27/2015  Neck pain 10/27/2015   Hematemesis 10/27/2015   Hematochezia 10/27/2015   Diverticulitis of colon with perforation 07/05/2015    REFERRING DIAG: M54.50,G89.29 (ICD-10-CM) - Chronic left-sided low back pain without sciatica M54.6 (ICD-10-CM) - Pain in thoracic spine M79.18 (ICD-10-CM) - Myofascial pain syndrome G89.4 (ICD-10-CM) - Chronic pain syndrome  Rationale for Evaluation and Treatment Rehabilitation  THERAPY DIAG:  Other low back pain  Pain in thoracic spine  Abnormal posture  ONSET DATE: Chronic (years)  SUBJECTIVE:                                                                                                                                                                                           SUBJECTIVE STATEMENT: Pt indicated last visit did help symptoms.  Reported 5/10 pain today and wanted to do the needling treatment today.  Pt also indicated pressure on back (mobs) can help as well.   PERTINENT HISTORY:  History of skilled PT services until June 2023.  History of trigger point injections and epidural injection.   Had ED visit (not seen) on 11/05/2021 for abdominal pain as well.   Hyperlipidemia, HTN, GERD in medical history.   History of several abdominal surgeries.   PAIN:  NPRS scale: 5/10 mid thoracic pain Pain location: see above Pain description: ache, catch Aggravating factors: unsure Relieving factors: rest, medicine, dry needling, mobs   PRECAUTIONS: None  WEIGHT BEARING RESTRICTIONS No  FALLS:  Has patient fallen in last 6 months? No  LIVING ENVIRONMENT: Lives with: lives with their family Lives in: House/apartment Stairs:1 step to enter front, back steps 4-5.   OCCUPATION: Retired  PLOF: Independent, walking dog, general household activity, fishing  PATIENT GOALS have less pain   OBJECTIVE:   Imaging:  11/16/2021:  Thoracic MRI imaging from February exhibits minimal degenerative changes, no nerve impingement, no stenosis. Lumbar MRI imaging from 2022 exhibits multilevel facet arthropathy and broad-based disc protrusion contributing to mild foraminal narrowing bilaterally, left greater than right. No high grade spinal canal stenosis  PATIENT SURVEYS: \ 12/29/21:   FOTO: 50%  11/26/2021 FOTO: 50   11/16/2021 FOTO intake:  49  predicted:  56    SCREENING FOR RED FLAGS: 11/16/2021 Bowel or bladder incontinence: No Cauda equina syndrome: No  COGNITION: 11/16/2021  Overall cognitive status: WFL normal    SENSATION: 11/16/2021 [facility]  MUSCLE LENGTH: 11/16/2021  None checked today  POSTURE:  11/16/2021 Lt shoulder higher than Rt in sitting/standing, Mild Lt lateral shift of trunk, increased thoracic kyphosis, reduced lumbar lordosis in standing.   PALPATION: 11/16/2021 Lumbar paraspinal tenderness bilateral  Lt > Rt.  Lt QL tenderness  Back/  LUMBAR ROM:   AROM  AROM  11/16/2021 AROM 11/26/2021 AROM 12/11/21 AROM 01/05/2022  Lumbar Flexion To mid shin no complaints.  To mid shin no complaints.  To mid shin with pulling stretch in middle back    Lumbar Extension < 25 % c ERP 75% no complaints 75% no complaints   Lumbar Right lateral flexion To mid thigh c pain To mid thigh with  no complaints To mid thigh with no complaints   Lumbar Left lateral flexion To mid thigh c pain To lateral knee with no complaints To mid thigh with no complaints         Thoracic extension Unable to reach neutral  Unable to reach neutral, feeling pulling/knot in medial Rt scap Unable to reach neutral 25% [facility]  Thoracic flexion < 25 % 50% 50%   Lt thoracic rotation 35 35 36   Rt thoracic rotation 40 36 33    (Blank rows = not tested)  LOWER EXTREMITY ROM:      Right 11/16/2021 Left 11/16/2021  Hip flexion    Hip extension    Hip abduction    Hip adduction    Hip internal rotation    Hip external rotation    Knee flexion    Knee extension    Ankle dorsiflexion    Ankle plantarflexion    Ankle inversion    Ankle eversion     (Blank rows = not tested)  LOWER EXTREMITY MMT:    MMT Right 11/16/2021 Left 11/16/2021  Hip flexion    Hip extension    Hip abduction    Hip adduction    Hip internal rotation    Hip external rotation    Knee flexion    Knee extension    Ankle dorsiflexion    Ankle plantarflexion             (Blank rows = not tested) 11/16/2021:  General postural strength deficits as observed by inability to hold postural corrections.   LUMBAR SPECIAL TESTS:  11/16/2021 (-) slump bilateral.  General PAIVM hypomobility noted throughout  FUNCTIONAL TESTS:  11/16/2021 None performed today  GAIT: 11/16/2021 Independent  TODAY'S TREATMENT  01/05/2022 Therex: UBE UE/LE lvl 4.0 4 mins fwd/back each way Corner pec stretch 15 sec x 5 Bilateral shoulder flexion c looking up wall slides x 10 2-3 sec holds Standing at wall horizontal abduction shoulder c thoracic rotation x 15 bilateral green band Thoracic extension in chair over foam c towel 2-3 sec hold x 10    Manual: Skilled palpation c Dry needling.  Prone g3 cPA T5-T9 for mobility gains.   Dry needling: Verbal consent by Pt given.  Twitch response c concordant symptoms in area at multifidi Lt T 6-T8.  No  adverse reactions noted to treatment today  12/29/2021:  Therex:  Supine trunk rotation x 3 holding 30 seconds Supine c towel roll placed vertical and horizontal for thoracic extension holding 2 minute each Green tband rows standing 2 x 15 bilateral Green tband gh ext standing 2 x 15 bilateral Corner chest/shoulder stretch 15 sec x 3 Manual STM c active movement stretch techniques for trigger point release in left upper trap and levator using Biofreeze.  Modalities" Moist heat to cervical, thoracic and lumbar spine x 5 minutes  12/22/2021:  Therex:  Nustep lvl 5 10 mins UE/LE Green tband rows standing 2 x 15 bilateral Green tband gh ext standing 2 x 15 bilateral Corner chest/shoulder stretch 15 sec x 3 Wall slides UE  in to flexion c step in and focus cervical/thoracic extension x 10 Standing thoracic rotation/horizontal abduction ROM x 10 bilateral Seated thoracic extension over 1/2 foam in chair x 15  12/15/2021:  Manual: Skilled palpation c Dry needling.  Prone g3 cPA T3-T9 for mobility gains.   Dry needling: Verbal consent by Pt given.  Twitch response c concordant symptoms in area at multifidi T 6-T8.  Handout provided for education.    Therex:  Prone scapular retraction hold 5 sec x 10 Standing thoracic rotation at wall x 10 bilateral Tband rows green band 2 x 15  Tband gh ext green c pause in end range at legs 2 x 15  PATIENT EDUCATION:  Education details: HEP progression Person educated: Patient Education method: Consulting civil engineer, Demonstration, Verbal cues, and Handouts Education comprehension: verbalized understanding, returned demonstration, and verbal cues required   HOME EXERCISE PROGRAM: Access Code: [email or web address] URL: [email or web address] Date: 12/11/2021 Prepared by: Scot Jun  Exercises - Shoulder External Rotation and Scapular Retraction  - 2-3 x daily - 7 x weekly - 1 sets - 10 reps - 2 hold - Seated Thoracic Lumbar Extension  - 2-3 x daily  - 7 x weekly - 1 sets - 10 reps - 3-5 hold - Supine Lower Trunk Rotation  - 2-3 x daily - 7 x weekly - 1 sets - 3-5 reps - 15 hold - Standing Thoracic Rotation at Wall  - 2-3 x daily - 7 x weekly - 1 sets - 8-10 reps - 3-5 hold - Standing Lumbar Extension  - 2-3 x daily - 7 x weekly - 1 sets - 5-10 reps - Seated Multifidi Isometric  - 2-3 x daily - 7 x weekly - 1 sets - 10 reps - 5-10 hold - Seated Abdominal Press into [employer]  - 2-3 x daily - 7 x weekly - 1 sets - 10 reps - 5-10 hold - Sit to Stand  - 3 x daily - 7 x weekly - 1 sets - 10 reps  ASSESSMENT:  CLINICAL IMPRESSION: Pt presentation of postural changes (kyphosis in thoracic, noted forward head posture resulting in increase cervical lordosis) continued to provide challenges in ability to tolerate postures and static positioning during day as well as yard work.  Short term improvements noted in times of pain increase c combination of manual and dry needling.  Continued education throughout visit about use of mobility at home.    OBJECTIVE IMPAIRMENTS Abnormal gait, decreased activity tolerance, decreased endurance, decreased mobility, difficulty walking, decreased ROM, decreased strength, increased fascial restrictions, impaired perceived functional ability, increased muscle spasms, impaired flexibility, improper body mechanics, postural dysfunction, and pain.   ACTIVITY LIMITATIONS carrying, lifting, bending, standing, sleeping, and locomotion level  PARTICIPATION LIMITATIONS: cleaning, interpersonal relationship, community activity, yard work, and housework  PERSONAL FACTORS  Hyperlipidemia, HTN, GERD, time since onset of symptoms  are also affecting patient's functional outcome.   REHAB POTENTIAL: Good  CLINICAL DECISION MAKING: Stable/uncomplicated  EVALUATION COMPLEXITY: Low   GOALS: Goals reviewed with patient? Yes  Short term PT Goals (target date for Short term goals are 3 weeks 12/07/2021) Patient will demonstrate  independent use of home exercise program to maintain progress from in clinic treatments. Goal status: Met    Long term PT goals (target dates for all long term goals are 10 weeks  01/25/2022 )   1. Patient will demonstrate/report pain at worst less than or equal to 2/10 to facilitate minimal limitation in daily activity secondary to pain symptoms. Goal status:  on going - assessed 12/15/2021   2. Patient will demonstrate independent use of home exercise program to facilitate ability to maintain/progress functional gains from skilled physical therapy services. Goal status: on going - assessed 12/15/2021   3. Patient will demonstrate FOTO outcome > or = 56 % to indicate reduced disability due to condition. Goal status: on going - assessed 12/28/2021   4. Patient will demonstrate lumbar extension > or = 75 % WFL s symptoms to facilitate upright standing, walking posture at PLOF s limitation. Goal status: on going - assessed 12/15/2021   5.  Patient will demonstrate thoracic extension 25% WFL to facilitate upright standing/sitting posture.    Goal status: on going - assessed 12/15/2021   6.  Patient will demonstrate/report ability to perform usual walking, sleeping at PLOF.   Goal status: on going - assessed 12/15/2021    PLAN: PT FREQUENCY: 1-2x/week  PT DURATION: 10 weeks  PLANNED INTERVENTIONS: Therapeutic exercises, Therapeutic activity, Neuro Muscular re-education, Balance training, Gait training, Patient/Family education, Joint mobilization, Stair training, DME instructions, Dry Needling, Electrical stimulation, Cryotherapy, Moist heat, Taping, Ultrasound, Ionotophoresis 4mg /ml Dexamethasone, and Manual therapy.  All included unless contraindicated   PLAN FOR NEXT SESSION: 10th visit progress note next visit.  Dry needling/mobilization for pain reduction.  Continue to address mobility restriction and postural improvements as tolerated.    Scot Jun, PT, DPT, OCS, ATC 01/05/22   10:54 AM

## 2022-01-12 ENCOUNTER — Encounter: Payer: Self-pay | Admitting: Rehabilitative and Restorative Service Providers"

## 2022-01-12 ENCOUNTER — Ambulatory Visit (INDEPENDENT_AMBULATORY_CARE_PROVIDER_SITE_OTHER): Payer: Medicare Other | Admitting: Rehabilitative and Restorative Service Providers"

## 2022-01-12 DIAGNOSIS — M5459 Other low back pain: Secondary | ICD-10-CM

## 2022-01-12 DIAGNOSIS — M546 Pain in thoracic spine: Secondary | ICD-10-CM | POA: Diagnosis not present

## 2022-01-12 DIAGNOSIS — R293 Abnormal posture: Secondary | ICD-10-CM

## 2022-01-12 NOTE — Therapy (Signed)
OUTPATIENT PHYSICAL THERAPY TREATMENT /PROGRESS NOTE [facility]   Patient Name: Miguel [name] MRN: [id number] DOB:12-30-1950, 71 y.o., male Today's Date: 01/12/2022  PCP: Alroy Dust L. Marlou Sa MD  REFERRING PROVIDER: Lorine Bears, NP  Progress Note Reporting Period 11/16/2021 to 01/12/2022  See note below for Objective Data and Assessment of Progress/Goals.    END OF SESSION:   PT End of Session - 01/12/22 1022     Visit Number 10    Number of Visits 20    Date for PT Re-Evaluation 02/23/22    Progress Note Due on Visit 20    PT Start Time 1016    PT Stop Time 1055    PT Time Calculation (min) 39 min    Activity Tolerance Patient tolerated treatment well    Behavior During Therapy WFL for tasks assessed/performed                  Past Medical History:  Diagnosis Date   Anemia    Anxiety    BPH (benign prostatic hyperplasia)    Gastritis    GERD (gastroesophageal reflux disease)    "seldom" (07/16/2016)   GI bleed due to NSAIDs 10/27/2015   History of blood transfusion 06/2016   post OR/notes 07/15/2016   History of hiatal hernia    History of kidney stones    surgery to remove stone   Hyperlipidemia    Hypertension    no meds    Melanoma of back ([place])    "mid back"   Sigmoid diverticulitis    with perforation   Past Surgical History:  Procedure Laterality Date   COLON SURGERY     sigmoid   COLOSTOMY TAKEDOWN N/A 06/28/2016   Procedure: COLOSTOMY TAKEDOWN;  Surgeon: [name], MD;  Location: [facility];  Service: General;  Laterality: N/A;   CYSTOSCOPY WITH RETROGRADE PYELOGRAM, URETEROSCOPY AND STENT PLACEMENT Bilateral 02/12/2020   Procedure: CYSTOSCOPY WITH BILATERAL RETROGRADE PYELOGRAM,  AND LITHOPEXY;  Surgeon: Remi Haggard, MD;  Location: [facility];  Service: Urology;  Laterality: Bilateral;  1 HR   ESOPHAGOGASTRODUODENOSCOPY N/A 10/27/2015   Procedure: ESOPHAGOGASTRODUODENOSCOPY (EGD);  Surgeon: Clarene Essex, MD;  Location: [facility] END[facility]COPY;   Service: Endoscopy;  Laterality: N/A;   ESOPHAGOGASTRODUODENOSCOPY N/A 10/29/2015   Procedure: ESOPHAGOGASTRODUODENOSCOPY (EGD);  Surgeon: Ronald Lobo, MD;  Location: [facility] ENDOSCOPY;  Service: Endoscopy;  Laterality: N/A;   ESOPHAGOGASTRODUODENOSCOPY N/A 10/30/2015   Procedure: ESOPHAGOGASTRODUODENOSCOPY (EGD);  Surgeon: Clarene Essex, MD;  Location: [facility] ENDOSCOPY;  Service: Endoscopy;  Laterality: N/A;   g tube discontinued  04/2020   per patient   [place]  11/21/2015   REDUCTION OF HIATAL HERNIA , REPAIR HIATAL HERNIA, RESECTION SMALL BOWEL WITH ANASTOMOSIS, PLACEMENT GASTROSTOMY TUBE, PLACEMENT DUODENOSTOMY TUBE (N/A)   HEMORRHOID BANDING  X 2   HERNIA REPAIR     HIATAL HERNIA REPAIR N/A 11/21/2015   Procedure: REDUCTION OF HIATAL HERNIA , REPAIR HIATAL HERNIA, RESECTION SMALL BOWEL WITH ANASTOMOSIS, PLACEMENT GASTROSTOMY TUBE, PLACEMENT DUODENOSTOMY TUBE;  Surgeon: Mickeal Skinner, MD;  Location: [place];  Service: General;  Laterality: N/A;   HOLMIUM LASER APPLICATION Right [id number]/45/[id number]   Procedure: HOLMIUM LASER APPLICATION;  Surgeon: Remi Haggard, MD;  Location: [facility];  Service: Urology;  Laterality: Right;   INCISIONAL HERNIA REPAIR  06/28/2016   open/notes 07/15/2016   INCISIONAL HERNIA REPAIR  03/17/2018   WITH MESH   INCISIONAL HERNIA REPAIR N/A 03/17/2018   Procedure: INCISIONAL HERNIA REPAIR WITH MESH;  Surgeon: [name], MD;  Location: [place];  Service: General;  Laterality: N/A;   INCISIONAL HERNIA REPAIR N/A 10/16/2020   Procedure: INCISIONAL HERNIA REPAIR WITH MESH;  Surgeon: [name], MD;  Location: [place];  Service: General;  Laterality: N/A;   INGUINAL HERNIA REPAIR Bilateral 09/28/2018   INGUINAL HERNIA REPAIR Bilateral 09/28/2018   Procedure: BILATERAL OPEN INGUINAL HERNIA REPAIR WITH MESH;  Surgeon: [name], MD;  Location: [place];  Service: General;  Laterality: Bilateral;  GENERAL AND TAP BLOCK   INSERTION OF MESH N/A 03/17/2018    Procedure: INSERTION OF MESH;  Surgeon: [name], MD;  Location: [place];  Service: General;  Laterality: N/A;   INSERTION OF MESH Bilateral 09/28/2018   Procedure: Insertion Of Mesh;  Surgeon: [name], MD;  Location: [place];  Service: General;  Laterality: Bilateral;   IR CM INJ ANY COLONIC TUBE W/FLUORO  02/04/2017   IR GUIDED DRAIN W CATHETER PLACEMENT  07/06/2016   /NOTES 07/15/2016   IR PATIENT EVAL TECH 0-60 MINS  06/28/2019   IR RADIOLOGIST EVAL & MGMT  07/27/2016   IR RADIOLOGIST EVAL & MGMT  08/17/2016   IR RADIOLOGIST EVAL & MGMT  08/26/2016   IR REPLACE G-TUBE SIMPLE WO FLUORO  07/28/2017   IR REPLACE G-TUBE SIMPLE WO FLUORO  01/31/2018   IR REPLACE G-TUBE SIMPLE WO FLUORO  10/16/2018   IR REPLACE G-TUBE SIMPLE WO FLUORO  03/05/2019   IR REPLACE G-TUBE SIMPLE WO FLUORO  08/22/2019   IR REPLACE G-TUBE SIMPLE WO FLUORO  01/08/2020   IR [place] GASTRO/COLONIC TUBE PERCUT W/FLUORO  08/04/2016   IR [place] GASTRO/COLONIC TUBE PERCUT W/FLUORO  01/14/2017   IR US GUIDE BX ASP/DRAIN  08/04/2016   KNEE CARTILAGE SURGERY Right 1971   "opened me up"   LAPAROTOMY N/A 07/05/2015   Procedure: PARTIAL SIGMOID COLECTOMY AND COLOSTOMY;  Surgeon: [name], MD;  Location: [place] OR;  Service: General;  Laterality: N/A;   MELANOMA EXCISION  2001   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  10/30/2015   Procedure: REMOVAL OF DISTAL STOMACH;  Surgeon: Judeth Horn, MD;  Location: [place];  Service: General;;   REPAIR OF PERFORATED ULCER N/A 10/30/2015   Procedure: REPAIR OF BLEEDING  ULCER;  Surgeon: Judeth Horn, MD;  Location: [place] OR;  Service: General;  Laterality: N/A;   TUMOR EXCISION  2009   "back; fatty tumor"   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   Bilateral inguinal hernia 09/28/2018   Incisional hernia 03/17/2018   Trigger thumb, left thumb 01/19/2018   Trigger finger, left index  finger 07/18/2017   Trigger finger, right ring finger 01/31/2017   Trigger finger of left thumb 01/03/2017   Trigger finger of right thumb 01/03/2017   Trigger index finger of left hand 01/03/2017   Trigger index finger of right hand 01/03/2017   Malnutrition of moderate degree 07/19/2016   Intra-abdominal abscess ([place]) 07/16/2016   Cellulitis 07/15/2016   S/P colostomy takedown 06/28/2016   Pain in thoracic spine 06/09/2016   Mid back pain 06/09/2016   Postoperative fever 11/19/2015   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   Severe protein-calorie malnutrition ([place]) 11/17/2015   Sepsis ([place]) 11/14/2015   Hiccups 11/14/2015   AKI (acute kidney injury) ([place]) 11/14/2015   Fever    Leg swelling    Left shoulder pain    Muscle spasm of left shoulder  GI bleed 10/27/2015   Acute blood loss anemia 10/27/2015   Syncope 10/27/2015   Hyperglycemia 10/27/2015   Hypotension 10/27/2015   Neck pain 10/27/2015   Hematemesis 10/27/2015   Hematochezia 10/27/2015   Diverticulitis of colon with perforation 07/05/2015    REFERRING DIAG: M54.50,G89.29 (ICD-10-CM) - Chronic left-sided low back pain without sciatica M54.6 (ICD-10-CM) - Pain in thoracic spine M79.18 (ICD-10-CM) - Myofascial pain syndrome G89.4 (ICD-10-CM) - Chronic pain syndrome  Rationale for Evaluation and Treatment Rehabilitation  THERAPY DIAG:  Other low back pain  Pain in thoracic spine  Abnormal posture  ONSET DATE: Chronic (years)  SUBJECTIVE:                                                                                                                                                                                           SUBJECTIVE STATEMENT: Pt indicated "doing fairly well overall."  Pt indicated he went to the [employer] and rented a scooter to move around.  Pt indicated Lt lower back was sore/some trouble today upon arrival.  Moderately better +4 rated on global rating of change.   PERTINENT HISTORY:   History of skilled PT services until June 2023.  History of trigger point injections and epidural injection.   Had ED visit (not seen) on 11/05/2021 for abdominal pain as well.   Hyperlipidemia, HTN, GERD in medical history.  History of several abdominal surgeries.   PAIN:  NPRS scale: at worst  4/10.  Pain location: thoracic  Pain description: ache, catch Aggravating factors: unsure Relieving factors: rest, medicine, dry needling, mobs   PRECAUTIONS: None  WEIGHT BEARING RESTRICTIONS No  FALLS:  Has patient fallen in last 6 months? No  LIVING ENVIRONMENT: Lives with: lives with their family Lives in: House/apartment Stairs:1 step to enter front, back steps 4-5.   OCCUPATION: Retired  PLOF: Independent, walking dog, general household activity, fishing  PATIENT GOALS have less pain   OBJECTIVE:   Imaging:  11/16/2021:  Thoracic MRI imaging from February exhibits minimal degenerative changes, no nerve impingement, no stenosis. Lumbar MRI imaging from 2022 exhibits multilevel facet arthropathy and broad-based disc protrusion contributing to mild foraminal narrowing bilaterally, left greater than right. No high grade spinal canal stenosis  PATIENT SURVEYS:  12/29/21:   FOTO: 50%  11/26/2021 FOTO: 50   11/16/2021 FOTO intake:  49  predicted:  56  SCREENING FOR RED FLAGS: 11/16/2021 Bowel or bladder incontinence: No Cauda equina syndrome: No  COGNITION: 11/16/2021  Overall cognitive status: WFL normal    SENSATION: 11/16/2021 [facility]  MUSCLE LENGTH: 11/16/2021  None checked today  POSTURE:  11/16/2021 Lt shoulder higher than  Rt in sitting/standing, Mild Lt lateral shift of trunk, increased thoracic kyphosis, reduced lumbar lordosis in standing.   PALPATION: 11/16/2021 Lumbar paraspinal tenderness bilateral  Lt > Rt.  Lt QL tenderness  Back/ LUMBAR ROM:   AROM  AROM  11/16/2021 AROM 11/26/2021 AROM 12/11/21 AROM 01/05/2022 AROM 01/12/2022  Lumbar Flexion To mid shin  no complaints.  To mid shin no complaints.  To mid shin with pulling stretch in middle back   To mid shin, no complaints  Lumbar Extension < 25 % c ERP 75% no complaints 75% no complaints  75% c tightness Lt lumbar  Repeated x 5 , continued tightness Lt lumbar  Lumbar Right lateral flexion To mid thigh c pain To mid thigh with no complaints To mid thigh with no complaints    Lumbar Left lateral flexion To mid thigh c pain To lateral knee with no complaints To mid thigh with no complaints           Thoracic extension Unable to reach neutral  Unable to reach neutral, feeling pulling/knot in medial Rt scap Unable to reach neutral 25% WFL 25% [employer]  Thoracic flexion < 25 % 50% 50%    Lt thoracic rotation 35 35 36  40  Rt thoracic rotation 40 36 33  40   (Blank rows = not tested)  LOWER EXTREMITY ROM:      Right 11/16/2021 Left 11/16/2021  Hip flexion    Hip extension    Hip abduction    Hip adduction    Hip internal rotation    Hip external rotation    Knee flexion    Knee extension    Ankle dorsiflexion    Ankle plantarflexion    Ankle inversion    Ankle eversion     (Blank rows = not tested)  LOWER EXTREMITY MMT:    MMT Right 11/16/2021 Left 11/16/2021  Hip flexion    Hip extension    Hip abduction    Hip adduction    Hip internal rotation    Hip external rotation    Knee flexion    Knee extension    Ankle dorsiflexion    Ankle plantarflexion             (Blank rows = not tested) 11/16/2021:  General postural strength deficits as observed by inability to hold postural corrections.   LUMBAR SPECIAL TESTS:  11/16/2021 (-) slump bilateral.  General PAIVM hypomobility noted throughout  FUNCTIONAL TESTS:  11/16/2021 None performed today  GAIT: 11/16/2021 Independent  TODAY'S TREATMENT  01/12/2022 Therex: UBE UE/LE lvl 3.0 4 mins fwd/back each way Standing lumbar extension AROM x 5 Standing green band bilateral scapular retraction c rows 2 x 15 Standing GH ext  green band bilateral 2 x 15 Standing bilateral UE ER c scapular retraction green band 2 x 10  Sitting table push down 5 sec hold x 10  Sitting table UE lift isometric multifidi hold 5 sec x 10  Manual: Skilled palpation c Dry needling.  Prone g3 cPA T5-T9 for mobility gains.   Dry needling: Verbal consent by Pt given.  Twitch response c concordant symptoms in area at multifidi Lt T 6-T8.  No adverse reactions noted to treatment today  01/05/2022 Therex: UBE UE/LE lvl 4.0 4 mins fwd/back each way Corner pec stretch 15 sec x 5 Bilateral shoulder flexion c looking up wall slides x 10 2-3 sec holds Standing at wall horizontal abduction shoulder c thoracic rotation x 15 bilateral green band Thoracic  extension in chair over foam c towel 2-3 sec hold x 10    Manual: Skilled palpation c Dry needling.  Prone g3 cPA T5-T9 for mobility gains.   Dry needling: Verbal consent by Pt given.  Twitch response c concordant symptoms in area at multifidi Lt T 6-T8.  No adverse reactions noted to treatment today  12/29/2021:  Therex:  Supine trunk rotation x 3 holding 30 seconds Supine c towel roll placed vertical and horizontal for thoracic extension holding 2 minute each Green tband rows standing 2 x 15 bilateral Green tband gh ext standing 2 x 15 bilateral Corner chest/shoulder stretch 15 sec x 3 Manual STM c active movement stretch techniques for trigger point release in left upper trap and levator using Biofreeze.  Modalities" Moist heat to cervical, thoracic and lumbar spine x 5 minutes  PATIENT EDUCATION:  Education details: HEP progression Person educated: Patient Education method: Explanation, Demonstration, Verbal cues, and Handouts Education comprehension: verbalized understanding, returned demonstration, and verbal cues required   HOME EXERCISE PROGRAM: Access Code: [email or web address] URL: [email or web address] Date: 12/11/2021 Prepared by: Scot Jun  Exercises -  Shoulder External Rotation and Scapular Retraction  - 2-3 x daily - 7 x weekly - 1 sets - 10 reps - 2 hold - Seated Thoracic Lumbar Extension  - 2-3 x daily - 7 x weekly - 1 sets - 10 reps - 3-5 hold - Supine Lower Trunk Rotation  - 2-3 x daily - 7 x weekly - 1 sets - 3-5 reps - 15 hold - Standing Thoracic Rotation at Wall  - 2-3 x daily - 7 x weekly - 1 sets - 8-10 reps - 3-5 hold - Standing Lumbar Extension  - 2-3 x daily - 7 x weekly - 1 sets - 5-10 reps - Seated Multifidi Isometric  - 2-3 x daily - 7 x weekly - 1 sets - 10 reps - 5-10 hold - Seated Abdominal Press into [employer]  - 2-3 x daily - 7 x weekly - 1 sets - 10 reps - 5-10 hold - Sit to Stand  - 3 x daily - 7 x weekly - 1 sets - 10 reps  ASSESSMENT:  CLINICAL IMPRESSION: Pt has attended 10 visits overall during course of treatment cycle.  Improvement in symptoms overall reported per Pt - global rating of change +4.  PT did continue to have some complaints in back that can impact functional activity. See objective data for updated information for current presentation updates.  Pt has made progress and HEP has been helpful.  Plan for reduced frequency of visits with continued skilled PT services indicated to continue to reach established goals.    OBJECTIVE IMPAIRMENTS Abnormal gait, decreased activity tolerance, decreased endurance, decreased mobility, difficulty walking, decreased ROM, decreased strength, increased fascial restrictions, impaired perceived functional ability, increased muscle spasms, impaired flexibility, improper body mechanics, postural dysfunction, and pain.   ACTIVITY LIMITATIONS carrying, lifting, bending, standing, sleeping, and locomotion level  PARTICIPATION LIMITATIONS: cleaning, interpersonal relationship, community activity, yard work, and housework  PERSONAL FACTORS  Hyperlipidemia, HTN, GERD, time since onset of symptoms  are also affecting patient's functional outcome.   REHAB POTENTIAL:  Good  CLINICAL DECISION MAKING: Stable/uncomplicated  EVALUATION COMPLEXITY: Low   GOALS: Goals reviewed with patient? Yes  Short term PT Goals (target date for Short term goals are 3 weeks 12/07/2021) Patient will demonstrate independent use of home exercise program to maintain progress from in clinic treatments. Goal status: Met    Long term PT  goals (target dates for all long term goals are 6 weeks  02/23/2022 )   1. Patient will demonstrate/report pain at worst less than or equal to 2/10 to facilitate minimal limitation in daily activity secondary to pain symptoms. Goal status: revised - assessed 01/12/2022   2. Patient will demonstrate independent use of home exercise program to facilitate ability to maintain/progress functional gains from skilled physical therapy services. Goal status: revised - assessed 01/12/2022   3. Patient will demonstrate FOTO outcome > or = 56 % to indicate reduced disability due to condition. Goal status: revised - assessed 01/12/2022   4. Patient will demonstrate lumbar extension > or = 75 % WFL s symptoms to facilitate upright standing, walking posture at PLOF s limitation. Goal status: revised - assessed 01/12/2022   5.  Patient will demonstrate thoracic extension 25% WFL to facilitate upright standing/sitting posture.    Goal status: MET - assessed 01/12/2022   6.  Patient will demonstrate/report ability to perform usual walking, sleeping at PLOF.   Goal status: revised- assessed 01/12/2022    PLAN: PT FREQUENCY: 1-2x/week  PT DURATION: 6 weeks  PLANNED INTERVENTIONS: Therapeutic exercises, Therapeutic activity, Neuro Muscular re-education, Balance training, Gait training, Patient/Family education, Joint mobilization, Stair training, DME instructions, Dry Needling, Electrical stimulation, Cryotherapy, Moist heat, Taping, Ultrasound, Ionotophoresis 68m/ml Dexamethasone, and Manual therapy.  All included unless contraindicated   PLAN FOR NEXT  SESSION:  Continue dry needling as desired for symptom relief in mid back.  Review HEP usage in trial period with less frequent visits.    MScot Jun PT, DPT, OCS, ATC 01/12/22  10:53 AM

## 2022-01-13 DIAGNOSIS — H6123 Impacted cerumen, bilateral: Secondary | ICD-10-CM | POA: Diagnosis not present

## 2022-01-19 ENCOUNTER — Ambulatory Visit (INDEPENDENT_AMBULATORY_CARE_PROVIDER_SITE_OTHER): Payer: Medicare Other | Admitting: Rehabilitative and Restorative Service Providers"

## 2022-01-19 ENCOUNTER — Encounter: Payer: Self-pay | Admitting: Rehabilitative and Restorative Service Providers"

## 2022-01-19 DIAGNOSIS — R293 Abnormal posture: Secondary | ICD-10-CM

## 2022-01-19 DIAGNOSIS — M546 Pain in thoracic spine: Secondary | ICD-10-CM

## 2022-01-19 DIAGNOSIS — M5459 Other low back pain: Secondary | ICD-10-CM

## 2022-01-19 NOTE — Therapy (Addendum)
OUTPATIENT PHYSICAL THERAPY TREATMENT /DISCHARGE   Patient Name: Miguel [name] MRN: [id number] DOB:December 04, 1950, 71 y.o., male Today's Date: 01/19/2022  PCP: Alroy Dust L. Marlou Sa MD  REFERRING PROVIDER: Lorine Bears, NP   END OF SESSION:   PT End of Session - 01/19/22 1014     Visit Number 11    Number of Visits 20    Date for PT Re-Evaluation 02/23/22    Progress Note Due on Visit 63    PT Start Time 1010    PT Stop Time 1049    PT Time Calculation (min) 39 min    Activity Tolerance Patient tolerated treatment well    Behavior During Therapy WFL for tasks assessed/performed                   Past Medical History:  Diagnosis Date   Anemia    Anxiety    BPH (benign prostatic hyperplasia)    Gastritis    GERD (gastroesophageal reflux disease)    "seldom" (07/16/2016)   GI bleed due to NSAIDs 10/27/2015   History of blood transfusion 06/2016   post OR/notes 07/15/2016   History of hiatal hernia    History of kidney stones    surgery to remove stone   Hyperlipidemia    Hypertension    no meds    Melanoma of back ([place])    "mid back"   Sigmoid diverticulitis    with perforation   Past Surgical History:  Procedure Laterality Date   COLON SURGERY     sigmoid   COLOSTOMY TAKEDOWN N/A 06/28/2016   Procedure: COLOSTOMY TAKEDOWN;  Surgeon: [name], MD;  Location: [facility];  Service: General;  Laterality: N/A;   CYSTOSCOPY WITH RETROGRADE PYELOGRAM, URETEROSCOPY AND STENT PLACEMENT Bilateral 02/12/2020   Procedure: CYSTOSCOPY WITH BILATERAL RETROGRADE PYELOGRAM,  AND LITHOPEXY;  Surgeon: Remi Haggard, MD;  Location: [facility];  Service: Urology;  Laterality: Bilateral;  1 HR   ESOPHAGOGASTRODUODENOSCOPY N/A 10/27/2015   Procedure: ESOPHAGOGASTRODUODENOSCOPY (EGD);  Surgeon: Clarene Essex, MD;  Location: [facility] ENDOSCOPY;  Service: Endoscopy;  Laterality: N/A;   ESOPHAGOGASTRODUODENOSCOPY N/A 10/29/2015   Procedure: ESOPHAGOGASTRODUODENOSCOPY (EGD);  Surgeon:  Ronald Lobo, MD;  Location: [facility] ENDOSCOPY;  Service: Endoscopy;  Laterality: N/A;   ESOPHAGOGASTRODUODENOSCOPY N/A 10/30/2015   Procedure: ESOPHAGOGASTRODUODENOSCOPY (EGD);  Surgeon: Clarene Essex, MD;  Location: [facility] ENDOSCOPY;  Service: Endoscopy;  Laterality: N/A;   g tube discontinued  04/2020   per patient   [place]  11/21/2015   REDUCTION OF HIATAL HERNIA , REPAIR HIATAL HERNIA, RESECTION SMALL BOWEL WITH ANASTOMOSIS, PLACEMENT GASTROSTOMY TUBE, PLACEMENT DUODENOSTOMY TUBE (N/A)   HEMORRHOID BANDING  X 2   HERNIA REPAIR     HIATAL HERNIA REPAIR N/A 11/21/2015   Procedure: REDUCTION OF HIATAL HERNIA , REPAIR HIATAL HERNIA, RESECTION SMALL BOWEL WITH ANASTOMOSIS, PLACEMENT GASTROSTOMY TUBE, PLACEMENT DUODENOSTOMY TUBE;  Surgeon: Mickeal Skinner, MD;  Location: [place];  Service: General;  Laterality: N/A;   HOLMIUM LASER APPLICATION Right 03/88/8280   Procedure: HOLMIUM LASER APPLICATION;  Surgeon: Remi Haggard, MD;  Location: [facility];  Service: Urology;  Laterality: Right;   INCISIONAL HERNIA REPAIR  06/28/2016   open/notes 07/15/2016   INCISIONAL HERNIA REPAIR  03/17/2018   WITH MESH   INCISIONAL HERNIA REPAIR N/A 03/17/2018   Procedure: INCISIONAL HERNIA REPAIR WITH MESH;  Surgeon: [name], MD;  Location: [place];  Service: General;  Laterality: N/A;   [place] N/A 10/16/2020   Procedure: Fatima Blank  HERNIA REPAIR WITH MESH;  Surgeon: [name], MD;  Location: [place];  Service: General;  Laterality: N/A;   INGUINAL HERNIA REPAIR Bilateral 09/28/2018   INGUINAL HERNIA REPAIR Bilateral 09/28/2018   Procedure: BILATERAL OPEN INGUINAL HERNIA REPAIR WITH MESH;  Surgeon: [name], MD;  Location: [place];  Service: General;  Laterality: Bilateral;  GENERAL AND TAP BLOCK   INSERTION OF MESH N/A 03/17/2018   Procedure: INSERTION OF MESH;  Surgeon: [name], MD;  Location: [place];  Service: General;  Laterality: N/A;   INSERTION OF  MESH Bilateral 09/28/2018   Procedure: Insertion Of Mesh;  Surgeon: [name], MD;  Location: [place];  Service: General;  Laterality: Bilateral;   IR CM INJ ANY COLONIC TUBE W/FLUORO  02/04/2017   IR GUIDED DRAIN W CATHETER PLACEMENT  07/06/2016   /NOTES 07/15/2016   IR PATIENT EVAL TECH 0-60 MINS  06/28/2019   IR RADIOLOGIST EVAL & MGMT  07/27/2016   IR RADIOLOGIST EVAL & MGMT  08/17/2016   IR RADIOLOGIST EVAL & MGMT  08/26/2016   IR REPLACE G-TUBE SIMPLE WO FLUORO  07/28/2017   IR REPLACE G-TUBE SIMPLE WO FLUORO  01/31/2018   IR REPLACE G-TUBE SIMPLE WO FLUORO  10/16/2018   IR REPLACE G-TUBE SIMPLE WO FLUORO  03/05/2019   IR REPLACE G-TUBE SIMPLE WO FLUORO  08/22/2019   IR REPLACE G-TUBE SIMPLE WO FLUORO  01/08/2020   IR [place] GASTRO/COLONIC TUBE PERCUT W/FLUORO  08/04/2016   IR [place] GASTRO/COLONIC TUBE PERCUT W/FLUORO  01/14/2017   IR US GUIDE BX ASP/DRAIN  08/04/2016   KNEE CARTILAGE SURGERY Right 1971   "opened me up"   LAPAROTOMY N/A 07/05/2015   Procedure: PARTIAL SIGMOID COLECTOMY AND COLOSTOMY;  Surgeon: [name], MD;  Location: [place] OR;  Service: General;  Laterality: N/A;   MELANOMA EXCISION  2001   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  10/30/2015   Procedure: REMOVAL OF DISTAL STOMACH;  Surgeon: Judeth Horn, MD;  Location: [place];  Service: General;;   REPAIR OF PERFORATED ULCER N/A 10/30/2015   Procedure: REPAIR OF BLEEDING  ULCER;  Surgeon: Judeth Horn, MD;  Location: [place] OR;  Service: General;  Laterality: N/A;   TUMOR EXCISION  2009   "back; fatty tumor"   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   Bilateral inguinal hernia 09/28/2018   Incisional hernia 03/17/2018   Trigger thumb, left thumb 01/19/2018   Trigger finger, left index finger 07/18/2017   Trigger finger, right ring finger 01/31/2017   Trigger finger of left thumb 01/03/2017   Trigger finger of right  thumb 01/03/2017   Trigger index finger of left hand 01/03/2017   Trigger index finger of right hand 01/03/2017   Malnutrition of moderate degree 07/19/2016   Intra-abdominal abscess ([place]) 07/16/2016   Cellulitis 07/15/2016   S/P colostomy takedown 06/28/2016   Pain in thoracic spine 06/09/2016   Mid back pain 06/09/2016   Postoperative fever 11/19/2015   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   Severe protein-calorie malnutrition ([place]) 11/17/2015   Sepsis ([place]) 11/14/2015   Hiccups 11/14/2015   AKI (acute kidney injury) ([place]) 11/14/2015   Fever    Leg swelling    Left shoulder pain    Muscle spasm of left shoulder    GI bleed 10/27/2015   Acute blood loss anemia 10/27/2015   Syncope 10/27/2015   Hyperglycemia 10/27/2015  Hypotension 10/27/2015   Neck pain 10/27/2015   Hematemesis 10/27/2015   Hematochezia 10/27/2015   Diverticulitis of colon with perforation 07/05/2015    REFERRING DIAG: M54.50,G89.29 (ICD-10-CM) - Chronic left-sided low back pain without sciatica M54.6 (ICD-10-CM) - Pain in thoracic spine M79.18 (ICD-10-CM) - Myofascial pain syndrome G89.4 (ICD-10-CM) - Chronic pain syndrome  Rationale for Evaluation and Treatment Rehabilitation  THERAPY DIAG:  Other low back pain  Pain in thoracic spine  Abnormal posture  ONSET DATE: Chronic (years)  SUBJECTIVE:                                                                                                                                                                                           SUBJECTIVE STATEMENT: Pt indicated area moved up a bit on Lt side in last day or two.  Pt indicated 5/10 pain upon arrival today.  Pt indicated pain areas were better on the other days prior to yesterday.   PERTINENT HISTORY:  History of skilled PT services until June 2023.  History of trigger point injections and epidural injection.   Had ED visit (not seen) on 11/05/2021 for abdominal pain as well.   Hyperlipidemia, HTN,  GERD in medical history.  History of several abdominal surgeries.   PAIN:  NPRS scale: at worst  5/10.  Pain location: thoracic  Pain description: ache, catch Aggravating factors: unsure Relieving factors: rest, medicine, dry needling, mobs   PRECAUTIONS: None  WEIGHT BEARING RESTRICTIONS No  FALLS:  Has patient fallen in last 6 months? No  LIVING ENVIRONMENT: Lives with: lives with their family Lives in: House/apartment Stairs:1 step to enter front, back steps 4-5.   OCCUPATION: Retired  PLOF: Independent, walking dog, general household activity, fishing  PATIENT GOALS have less pain   OBJECTIVE:   Imaging:  11/16/2021:  Thoracic MRI imaging from February exhibits minimal degenerative changes, no nerve impingement, no stenosis. Lumbar MRI imaging from 2022 exhibits multilevel facet arthropathy and broad-based disc protrusion contributing to mild foraminal narrowing bilaterally, left greater than right. No high grade spinal canal stenosis  PATIENT SURVEYS:  12/29/21:   FOTO: 50%  11/26/2021 FOTO: 50   11/16/2021 FOTO intake:  49  predicted:  56  SCREENING FOR RED FLAGS: 11/16/2021 Bowel or bladder incontinence: No Cauda equina syndrome: No  COGNITION: 11/16/2021  Overall cognitive status: WFL normal    SENSATION: 11/16/2021 [facility]  MUSCLE LENGTH: 11/16/2021  None checked today  POSTURE:  11/16/2021 Lt shoulder higher than Rt in sitting/standing, Mild Lt lateral shift of trunk, increased thoracic kyphosis, reduced lumbar lordosis in standing.   PALPATION: 11/16/2021 Lumbar paraspinal tenderness bilateral  Lt >  Rt.  Lt QL tenderness  Back/ LUMBAR ROM:   AROM  AROM  11/16/2021 AROM 11/26/2021 AROM 12/11/21 AROM 01/05/2022 AROM 01/12/2022  Lumbar Flexion To mid shin no complaints.  To mid shin no complaints.  To mid shin with pulling stretch in middle back   To mid shin, no complaints  Lumbar Extension < 25 % c ERP 75% no complaints 75% no complaints  75% c  tightness Lt lumbar  Repeated x 5 , continued tightness Lt lumbar  Lumbar Right lateral flexion To mid thigh c pain To mid thigh with no complaints To mid thigh with no complaints    Lumbar Left lateral flexion To mid thigh c pain To lateral knee with no complaints To mid thigh with no complaints           Thoracic extension Unable to reach neutral  Unable to reach neutral, feeling pulling/knot in medial Rt scap Unable to reach neutral 25% WFL 25% [facility]  Thoracic flexion < 25 % 50% 50%    Lt thoracic rotation 35 35 36  40  Rt thoracic rotation 40 36 33  40   (Blank rows = not tested)  LOWER EXTREMITY ROM:      Right 11/16/2021 Left 11/16/2021  Hip flexion    Hip extension    Hip abduction    Hip adduction    Hip internal rotation    Hip external rotation    Knee flexion    Knee extension    Ankle dorsiflexion    Ankle plantarflexion    Ankle inversion    Ankle eversion     (Blank rows = not tested)  LOWER EXTREMITY MMT:    MMT Right 11/16/2021 Left 11/16/2021  Hip flexion    Hip extension    Hip abduction    Hip adduction    Hip internal rotation    Hip external rotation    Knee flexion    Knee extension    Ankle dorsiflexion    Ankle plantarflexion             (Blank rows = not tested) 11/16/2021:  General postural strength deficits as observed by inability to hold postural corrections.   LUMBAR SPECIAL TESTS:  11/16/2021 (-) slump bilateral.  General PAIVM hypomobility noted throughout  FUNCTIONAL TESTS:  11/16/2021 None performed today  GAIT: 11/16/2021 Independent  TODAY'S TREATMENT  01/19/2022 Therex: UBE UE/LE lvl 3.5 4 mins fwd/back each way Alternating barrel hug and scapular retraction 2-3 sec hold 2 x 10  Green band lat pull down seated 2 x 15 Standing bilateral UE ER c scapular retraction green band 2 x 10  Sitting pball push down 5 sec hold x 15    Manual: Skilled palpation c Dry needling.  Prone g3 cPA T5-T9 for mobility gains.   Dry  needling: Verbal consent by Pt given.  Twitch response c concordant symptoms in area at multifidi Lt T 6-T8.  No adverse reactions noted to treatment today  01/12/2022 Therex: UBE UE/LE lvl 3.0 4 mins fwd/back each way Standing lumbar extension AROM x 5 Standing green band bilateral scapular retraction c rows 2 x 15 Standing GH ext green band bilateral 2 x 15 Standing bilateral UE ER c scapular retraction green band 2 x 10  Sitting table push down 5 sec hold x 10  Sitting table UE lift isometric multifidi hold 5 sec x 10  Manual: Skilled palpation c Dry needling.  Prone g3 cPA T5-T9 for mobility gains.  Dry needling: Verbal consent by Pt given.  Twitch response c concordant symptoms in area at multifidi Lt T 6-T8.  No adverse reactions noted to treatment today  01/05/2022 Therex: UBE UE/LE lvl 4.0 4 mins fwd/back each way Corner pec stretch 15 sec x 5 Bilateral shoulder flexion c looking up wall slides x 10 2-3 sec holds Standing at wall horizontal abduction shoulder c thoracic rotation x 15 bilateral green band Thoracic extension in chair over foam c towel 2-3 sec hold x 10    Manual: Skilled palpation c Dry needling.  Prone g3 cPA T5-T9 for mobility gains.   Dry needling: Verbal consent by Pt given.  Twitch response c concordant symptoms in area at multifidi Lt T 6-T8.  No adverse reactions noted to treatment today   PATIENT EDUCATION:  Education details: HEP progression Person educated: Patient Education method: Explanation, Demonstration, Verbal cues, and Handouts Education comprehension: verbalized understanding, returned demonstration, and verbal cues required   HOME EXERCISE PROGRAM: Access Code: [email or web address] URL: [email or web address] Date: 12/11/2021 Prepared by: Scot Jun  Exercises - Shoulder External Rotation and Scapular Retraction  - 2-3 x daily - 7 x weekly - 1 sets - 10 reps - 2 hold - Seated Thoracic Lumbar Extension  - 2-3 x daily -  7 x weekly - 1 sets - 10 reps - 3-5 hold - Supine Lower Trunk Rotation  - 2-3 x daily - 7 x weekly - 1 sets - 3-5 reps - 15 hold - Standing Thoracic Rotation at Wall  - 2-3 x daily - 7 x weekly - 1 sets - 8-10 reps - 3-5 hold - Standing Lumbar Extension  - 2-3 x daily - 7 x weekly - 1 sets - 5-10 reps - Seated Multifidi Isometric  - 2-3 x daily - 7 x weekly - 1 sets - 10 reps - 5-10 hold - Seated Abdominal Press into [employer]  - 2-3 x daily - 7 x weekly - 1 sets - 10 reps - 5-10 hold - Sit to Stand  - 3 x daily - 7 x weekly - 1 sets - 10 reps  ASSESSMENT:  CLINICAL IMPRESSION: Periods of time with reduced symptoms noted between visits with eventual return of some symptoms in mid thoracic region.  Dry needling and manual intervention continue to provide immediate reduction of symptoms while in clinic.  Encouraged continued use of HEP to promote mobility improvements at home.  If short term gains become longer term gains, Pt will be in good position for transitioning to HEP at that time.    OBJECTIVE IMPAIRMENTS Abnormal gait, decreased activity tolerance, decreased endurance, decreased mobility, difficulty walking, decreased ROM, decreased strength, increased fascial restrictions, impaired perceived functional ability, increased muscle spasms, impaired flexibility, improper body mechanics, postural dysfunction, and pain.   ACTIVITY LIMITATIONS carrying, lifting, bending, standing, sleeping, and locomotion level  PARTICIPATION LIMITATIONS: cleaning, interpersonal relationship, community activity, yard work, and housework  PERSONAL FACTORS  Hyperlipidemia, HTN, GERD, time since onset of symptoms  are also affecting patient's functional outcome.   REHAB POTENTIAL: Good  CLINICAL DECISION MAKING: Stable/uncomplicated  EVALUATION COMPLEXITY: Low   GOALS: Goals reviewed with patient? Yes  Short term PT Goals (target date for Short term goals are 3 weeks 12/07/2021) Patient will demonstrate  independent use of home exercise program to maintain progress from in clinic treatments. Goal status: Met    Long term PT goals (target dates for all long term goals are 6 weeks  02/23/2022 )   1. Patient  will demonstrate/report pain at worst less than or equal to 2/10 to facilitate minimal limitation in daily activity secondary to pain symptoms. Goal status: revised - assessed 01/12/2022   2. Patient will demonstrate independent use of home exercise program to facilitate ability to maintain/progress functional gains from skilled physical therapy services. Goal status: revised - assessed 01/12/2022   3. Patient will demonstrate FOTO outcome > or = 56 % to indicate reduced disability due to condition. Goal status: revised - assessed 01/12/2022   4. Patient will demonstrate lumbar extension > or = 75 % WFL s symptoms to facilitate upright standing, walking posture at PLOF s limitation. Goal status: revised - assessed 01/12/2022   5.  Patient will demonstrate thoracic extension 25% WFL to facilitate upright standing/sitting posture.    Goal status: MET - assessed 01/12/2022   6.  Patient will demonstrate/report ability to perform usual walking, sleeping at PLOF.   Goal status: revised- assessed 01/12/2022    PLAN: PT FREQUENCY: 1-2x/week  PT DURATION: 6 weeks  PLANNED INTERVENTIONS: Therapeutic exercises, Therapeutic activity, Neuro Muscular re-education, Balance training, Gait training, Patient/Family education, Joint mobilization, Stair training, DME instructions, Dry Needling, Electrical stimulation, Cryotherapy, Moist heat, Taping, Ultrasound, Ionotophoresis 29m/ml Dexamethasone, and Manual therapy.  All included unless contraindicated   PLAN FOR NEXT SESSION: Dry needling/manual prn.  Continue to incorporate postural strengthening and intermittent mobility in HEP.    MScot Jun PT, DPT, OCS, ATC 01/19/22  10:49 AM  PHYSICAL THERAPY DISCHARGE SUMMARY  Visits from Start  of Care: 11  Current functional level related to goals / functional outcomes: See note   Remaining deficits: See note   Education / Equipment: HEP  Patient goals were  mostly met . Patient is being discharged due to not returning since the last visit.  MScot Jun PT, DPT, OCS, ATC 03/03/22  11:53 AM

## 2022-01-25 DIAGNOSIS — R972 Elevated prostate specific antigen [PSA]: Secondary | ICD-10-CM | POA: Diagnosis not present

## 2022-02-01 DIAGNOSIS — N210 Calculus in bladder: Secondary | ICD-10-CM | POA: Diagnosis not present

## 2022-02-01 DIAGNOSIS — R1084 Generalized abdominal pain: Secondary | ICD-10-CM | POA: Diagnosis not present

## 2022-02-01 DIAGNOSIS — N401 Enlarged prostate with lower urinary tract symptoms: Secondary | ICD-10-CM | POA: Diagnosis not present

## 2022-02-01 DIAGNOSIS — R3914 Feeling of incomplete bladder emptying: Secondary | ICD-10-CM | POA: Diagnosis not present

## 2022-02-01 DIAGNOSIS — R972 Elevated prostate specific antigen [PSA]: Secondary | ICD-10-CM | POA: Diagnosis not present

## 2022-02-01 DIAGNOSIS — G8929 Other chronic pain: Secondary | ICD-10-CM | POA: Diagnosis not present

## 2022-02-08 DIAGNOSIS — K219 Gastro-esophageal reflux disease without esophagitis: Secondary | ICD-10-CM | POA: Diagnosis not present

## 2022-02-08 DIAGNOSIS — R350 Frequency of micturition: Secondary | ICD-10-CM | POA: Diagnosis not present

## 2022-02-08 DIAGNOSIS — I700 Atherosclerosis of aorta: Secondary | ICD-10-CM | POA: Diagnosis not present

## 2022-02-08 DIAGNOSIS — E7800 Pure hypercholesterolemia, unspecified: Secondary | ICD-10-CM | POA: Diagnosis not present

## 2022-02-08 DIAGNOSIS — F419 Anxiety disorder, unspecified: Secondary | ICD-10-CM | POA: Diagnosis not present

## 2022-02-08 DIAGNOSIS — M545 Low back pain, unspecified: Secondary | ICD-10-CM | POA: Diagnosis not present

## 2022-02-08 DIAGNOSIS — Z23 Encounter for immunization: Secondary | ICD-10-CM | POA: Diagnosis not present

## 2022-02-08 DIAGNOSIS — I10 Essential (primary) hypertension: Secondary | ICD-10-CM | POA: Diagnosis not present

## 2022-02-08 DIAGNOSIS — Z0000 Encounter for general adult medical examination without abnormal findings: Secondary | ICD-10-CM | POA: Diagnosis not present

## 2022-02-08 DIAGNOSIS — G4700 Insomnia, unspecified: Secondary | ICD-10-CM | POA: Diagnosis not present

## 2022-03-19 DIAGNOSIS — R1032 Left lower quadrant pain: Secondary | ICD-10-CM | POA: Diagnosis not present

## 2022-03-19 DIAGNOSIS — M545 Low back pain, unspecified: Secondary | ICD-10-CM | POA: Diagnosis not present

## 2022-04-07 ENCOUNTER — Encounter: Payer: Self-pay | Admitting: Orthopaedic Surgery

## 2022-04-07 ENCOUNTER — Ambulatory Visit (INDEPENDENT_AMBULATORY_CARE_PROVIDER_SITE_OTHER): Payer: Medicare Other | Admitting: Orthopaedic Surgery

## 2022-04-07 DIAGNOSIS — M25511 Pain in right shoulder: Secondary | ICD-10-CM

## 2022-04-07 DIAGNOSIS — G8929 Other chronic pain: Secondary | ICD-10-CM | POA: Diagnosis not present

## 2022-04-07 MED ORDER — LIDOCAINE HCL 1 % IJ SOLN
3.0000 mL | INTRAMUSCULAR | Status: AC | PRN
  Administered 2022-04-07: 3 mL

## 2022-04-07 MED ORDER — METHYLPREDNISOLONE ACETATE 40 MG/ML IJ SUSP
40.0000 mg | INTRAMUSCULAR | Status: AC | PRN
  Administered 2022-04-07: 40 mg via INTRA_ARTICULAR

## 2022-04-07 NOTE — Progress Notes (Signed)
I have seen the patient as well and discussed the treatment plan with Benita Stabile, PA-C.  I agree with his note with assessment and plan.

## 2022-04-07 NOTE — Progress Notes (Addendum)
   Procedure Note  Patient: Miguel [name]             Date of Birth: 11/23/1950           MRN: [id number]             Visit Date: 04/07/2022 HPI: Miguel [name] comes in today to discuss his back and shoulder pain.  We last saw him May 2023 and he was given a injection in his right shoulder for chronic right shoulder pain.  He states this helped.  He notes that he is taking oxycodone and tramadol he found some hydrocodone that he had leftover and is been taking now also feels that this has been beneficial for his back and his shoulder pain.  He also takes daily Tylenol.  He has had no new injuries to his shoulders or his back.  He states he had a really no change in his overall back pain.  He does feel that physical therapy helped while he was doing exercises for his back.  He feels that his right shoulder really began bothering him a month ago and is asking for possible injection in the shoulder.  Is also asking for hydrocodone.  Review of systems: Negative for fevers chills.  See HPI otherwise negative  Physical exam: Bilateral shoulders he has full overhead active motion of both shoulders.  5 out of 5 strength with external and internal rotation against resistance bilateral shoulders.  Positive impingement testing on the right negative on the left.   Procedures: Visit Diagnoses:  1. Chronic right shoulder pain     Large Joint Inj: R subacromial bursa on 04/07/2022 10:11 AM Indications: pain Details: 22 G 1.5 in needle, superior approach  Arthrogram: No  Medications: 3 mL lidocaine 1 %; 40 mg methylPREDNISolone acetate 40 MG/ML Outcome: tolerated well, no immediate complications Procedure, treatment alternatives, risks and benefits explained, specific risks discussed. Consent was given by the patient. Immediately prior to procedure a time out was called to verify the correct patient, procedure, equipment, support staff and site/side marked as required. Patient was prepped and draped in the  usual sterile fashion.     Plan: Discussed with him that it is not appropriate for him to be taking 3 different pain medications nor is it appropriate for him to get it from more than 1 provider.  Therefore he will discuss this with his primary care physician.  Also cautioned him on the amount of Tylenol that he takes in 1 day.  He will follow-up with [place] as needed.  Questions were encouraged and answered at length by Dr. Ninfa Linden and myself.

## 2022-06-08 DIAGNOSIS — Z85820 Personal history of malignant melanoma of skin: Secondary | ICD-10-CM | POA: Diagnosis not present

## 2022-06-08 DIAGNOSIS — Z1283 Encounter for screening for malignant neoplasm of skin: Secondary | ICD-10-CM | POA: Diagnosis not present

## 2022-06-08 DIAGNOSIS — Z08 Encounter for follow-up examination after completed treatment for malignant neoplasm: Secondary | ICD-10-CM | POA: Diagnosis not present

## 2022-06-08 DIAGNOSIS — D225 Melanocytic nevi of trunk: Secondary | ICD-10-CM | POA: Diagnosis not present

## 2022-06-08 DIAGNOSIS — D485 Neoplasm of uncertain behavior of skin: Secondary | ICD-10-CM | POA: Diagnosis not present

## 2022-06-29 DIAGNOSIS — L988 Other specified disorders of the skin and subcutaneous tissue: Secondary | ICD-10-CM | POA: Diagnosis not present

## 2022-06-29 DIAGNOSIS — Z1283 Encounter for screening for malignant neoplasm of skin: Secondary | ICD-10-CM | POA: Diagnosis not present

## 2022-06-29 DIAGNOSIS — D485 Neoplasm of uncertain behavior of skin: Secondary | ICD-10-CM | POA: Diagnosis not present

## 2022-07-29 DIAGNOSIS — R972 Elevated prostate specific antigen [PSA]: Secondary | ICD-10-CM | POA: Diagnosis not present

## 2022-08-05 DIAGNOSIS — N400 Enlarged prostate without lower urinary tract symptoms: Secondary | ICD-10-CM | POA: Diagnosis not present

## 2022-08-05 DIAGNOSIS — R972 Elevated prostate specific antigen [PSA]: Secondary | ICD-10-CM | POA: Diagnosis not present

## 2022-08-09 DIAGNOSIS — Z23 Encounter for immunization: Secondary | ICD-10-CM | POA: Diagnosis not present

## 2022-08-09 DIAGNOSIS — M545 Low back pain, unspecified: Secondary | ICD-10-CM | POA: Diagnosis not present

## 2022-08-09 DIAGNOSIS — K219 Gastro-esophageal reflux disease without esophagitis: Secondary | ICD-10-CM | POA: Diagnosis not present

## 2022-08-09 DIAGNOSIS — G4700 Insomnia, unspecified: Secondary | ICD-10-CM | POA: Diagnosis not present

## 2022-08-09 DIAGNOSIS — I700 Atherosclerosis of aorta: Secondary | ICD-10-CM | POA: Diagnosis not present

## 2022-08-09 DIAGNOSIS — F419 Anxiety disorder, unspecified: Secondary | ICD-10-CM | POA: Diagnosis not present

## 2022-08-09 DIAGNOSIS — R109 Unspecified abdominal pain: Secondary | ICD-10-CM | POA: Diagnosis not present

## 2022-08-09 DIAGNOSIS — E7800 Pure hypercholesterolemia, unspecified: Secondary | ICD-10-CM | POA: Diagnosis not present

## 2022-08-09 DIAGNOSIS — I10 Essential (primary) hypertension: Secondary | ICD-10-CM | POA: Diagnosis not present

## 2022-08-09 DIAGNOSIS — R350 Frequency of micturition: Secondary | ICD-10-CM | POA: Diagnosis not present

## 2022-08-10 DIAGNOSIS — R1084 Generalized abdominal pain: Secondary | ICD-10-CM | POA: Diagnosis not present

## 2022-09-10 DIAGNOSIS — G8929 Other chronic pain: Secondary | ICD-10-CM | POA: Diagnosis not present

## 2022-09-10 DIAGNOSIS — K432 Incisional hernia without obstruction or gangrene: Secondary | ICD-10-CM | POA: Diagnosis not present

## 2022-09-10 DIAGNOSIS — R1084 Generalized abdominal pain: Secondary | ICD-10-CM | POA: Diagnosis not present

## 2022-10-19 DIAGNOSIS — R1032 Left lower quadrant pain: Secondary | ICD-10-CM | POA: Diagnosis not present

## 2022-10-19 DIAGNOSIS — Z7189 Other specified counseling: Secondary | ICD-10-CM | POA: Diagnosis not present

## 2022-10-27 ENCOUNTER — Ambulatory Visit: Payer: Medicare Other | Admitting: Pain Medicine

## 2022-12-11 DIAGNOSIS — Z23 Encounter for immunization: Secondary | ICD-10-CM | POA: Diagnosis not present

## 2022-12-29 DIAGNOSIS — D2262 Melanocytic nevi of left upper limb, including shoulder: Secondary | ICD-10-CM | POA: Diagnosis not present

## 2022-12-29 DIAGNOSIS — Z85820 Personal history of malignant melanoma of skin: Secondary | ICD-10-CM | POA: Diagnosis not present

## 2022-12-29 DIAGNOSIS — Z1283 Encounter for screening for malignant neoplasm of skin: Secondary | ICD-10-CM | POA: Diagnosis not present

## 2022-12-29 DIAGNOSIS — Z08 Encounter for follow-up examination after completed treatment for malignant neoplasm: Secondary | ICD-10-CM | POA: Diagnosis not present

## 2022-12-29 DIAGNOSIS — D225 Melanocytic nevi of trunk: Secondary | ICD-10-CM | POA: Diagnosis not present

## 2022-12-29 DIAGNOSIS — D2261 Melanocytic nevi of right upper limb, including shoulder: Secondary | ICD-10-CM | POA: Diagnosis not present

## 2022-12-29 DIAGNOSIS — D485 Neoplasm of uncertain behavior of skin: Secondary | ICD-10-CM | POA: Diagnosis not present

## 2023-01-14 ENCOUNTER — Emergency Department (HOSPITAL_BASED_OUTPATIENT_CLINIC_OR_DEPARTMENT_OTHER)
Admission: EM | Admit: 2023-01-14 | Discharge: 2023-01-15 | Disposition: A | Discharge status: Home / Self Care | Payer: Medicare Other | Attending: Emergency Medicine | Admitting: Emergency Medicine

## 2023-01-14 ENCOUNTER — Other Ambulatory Visit: Payer: Self-pay

## 2023-01-14 ENCOUNTER — Encounter (HOSPITAL_BASED_OUTPATIENT_CLINIC_OR_DEPARTMENT_OTHER): Payer: Self-pay | Admitting: Emergency Medicine

## 2023-01-14 ENCOUNTER — Emergency Department (HOSPITAL_BASED_OUTPATIENT_CLINIC_OR_DEPARTMENT_OTHER): Payer: Medicare Other

## 2023-01-14 DIAGNOSIS — E1122 Type 2 diabetes mellitus with diabetic chronic kidney disease: Secondary | ICD-10-CM | POA: Insufficient documentation

## 2023-01-14 DIAGNOSIS — S0181XA Laceration without foreign body of other part of head, initial encounter: Secondary | ICD-10-CM | POA: Diagnosis not present

## 2023-01-14 DIAGNOSIS — R0781 Pleurodynia: Secondary | ICD-10-CM | POA: Diagnosis not present

## 2023-01-14 DIAGNOSIS — W228XXA Striking against or struck by other objects, initial encounter: Secondary | ICD-10-CM | POA: Diagnosis not present

## 2023-01-14 DIAGNOSIS — I6782 Cerebral ischemia: Secondary | ICD-10-CM | POA: Diagnosis not present

## 2023-01-14 DIAGNOSIS — W268XXA Contact with other sharp object(s), not elsewhere classified, initial encounter: Secondary | ICD-10-CM | POA: Diagnosis not present

## 2023-01-14 DIAGNOSIS — S098XXA Other specified injuries of head, initial encounter: Secondary | ICD-10-CM | POA: Diagnosis present

## 2023-01-14 DIAGNOSIS — S0990XA Unspecified injury of head, initial encounter: Secondary | ICD-10-CM | POA: Diagnosis not present

## 2023-01-14 DIAGNOSIS — S0101XA Laceration without foreign body of scalp, initial encounter: Secondary | ICD-10-CM | POA: Diagnosis not present

## 2023-01-14 DIAGNOSIS — I129 Hypertensive chronic kidney disease with stage 1 through stage 4 chronic kidney disease, or unspecified chronic kidney disease: Secondary | ICD-10-CM | POA: Diagnosis not present

## 2023-01-14 DIAGNOSIS — N189 Chronic kidney disease, unspecified: Secondary | ICD-10-CM | POA: Insufficient documentation

## 2023-01-14 NOTE — ED Triage Notes (Signed)
Hit in head by tree limb, fell back Lac to forehead Seen at [facility] lac repaired, cxr done, advise to go to ed for ct head\ denies symptoms Tetanus utd

## 2023-01-15 NOTE — ED Provider Notes (Signed)
[place] EMERGENCY DEPARTMENT AT [facility] Provider Note   CSN: [id number] Arrival date & time: 01/14/23  2134     History  No chief complaint on file.   Miguel [name] is a 72 y.o. male.  Patient is a 72 year old male with history of hypertension, type 2 diabetes, chronic renal insufficiency, prior abdominal surgery.  Patient presenting today for evaluation of a forehead injury.  Patient was cutting firewood today when one of the branches snapped back and struck him in the forehead.  He sustained a laceration to the forehead, but denies any loss of consciousness.  He does have a mild headache.  He was initially seen in urgent care where he had sutures placed, then was sent here for a CT scan to rule out intracranial injury.  Patient is at baseline and has no complaints except for mild forehead pain.  The history is provided by the patient.       Home Medications Prior to Admission medications   Medication Sig Start Date End Date Taking? Authorizing Provider  acetaminophen (TYLENOL) 500 MG tablet Take 1 tablet (500 mg total) by mouth every 8 (eight) hours as needed for mild pain (for pain). Patient taking differently: Take 250-500 mg by mouth every 4 (four) hours as needed for mild pain or fever. 12/05/15   Adam Phenix, PA-C  diazepam (VALIUM) 5 MG tablet Take one tablet by mouth with food one hour prior to procedure. May repeat 30 minutes prior if needed. 04/14/21   Juanda Chance, NP  diclofenac sodium (VOLTAREN) 1 % GEL Apply 2 g topically 3 (three) times daily as needed (joint pain).     [provider]  famotidine (PEPCID) 20 MG tablet Take 20 mg by mouth 2 (two) times daily.     [provider]  HYDROcodone-acetaminophen (NORCO/VICODIN) 5-325 MG tablet Take 1 tablet by mouth 2 (two) times daily as needed for moderate pain. 07/06/21   Kathryne Hitch, MD  LORazepam (ATIVAN) 1 MG tablet Take 0.5 mg by mouth daily as needed for anxiety.  05/29/18   [provider]  methocarbamol (ROBAXIN) 500 MG tablet Take 250-500 mg by mouth every 8 (eight) hours as needed for muscle spasms.  07/11/18   [provider]  methylPREDNISolone (MEDROL) 4 MG tablet Medrol dose pack. Take as instructed 11/24/20   Kathryne Hitch, MD  Multiple Vitamin (MULTIVITAMIN WITH MINERALS) TABS tablet Take 1 tablet by mouth daily.    [provider]  ondansetron (ZOFRAN-ODT) 4 MG disintegrating tablet Take 4 mg by mouth every 6 (six) hours as needed for nausea/vomiting. 07/11/18   [provider]  oxyCODONE (OXY IR/ROXICODONE) 5 MG immediate release tablet Take 1 tablet (5 mg total) by mouth every 6 (six) hours as needed for moderate pain, severe pain or breakthrough pain. 10/20/20   [name], MD  pantoprazole (PROTONIX) 40 MG tablet Take 40 mg by mouth 2 (two) times daily. 01/18/18   [provider]  polyethylene glycol (MIRALAX / GLYCOLAX) 17 g packet Take 17 g by mouth daily as needed for mild constipation.    [provider]  Probiotic Product (PROBIOTIC DAILY PO) Take 1 capsule by mouth daily.    [provider]  tamsulosin (FLOMAX) 0.4 MG CAPS capsule Take 1 capsule (0.4 mg total) by mouth daily. 01/04/16   Claud Kelp, MD  tiZANidine (ZANAFLEX) 2 MG tablet Take 1-2 mg by mouth 2 (two) times daily as needed for muscle spasms. 01/16/20  [provider]  traMADol (ULTRAM) 50 MG tablet Take 50 mg by mouth every 6 (six) hours as needed for moderate pain. 05/21/16   [provider]  vitamin B-12 (CYANOCOBALAMIN) 1000 MCG tablet Take 1,000 mcg by mouth daily.     [provider]  zolpidem (AMBIEN) 10 MG tablet Take 10 mg by mouth at bedtime.    [provider]      Allergies    Patient has no known allergies.    Review of Systems   Review of Systems  All other systems reviewed and are negative.   Physical Exam Updated Vital Signs BP (!) 181/94  (BP Location: Right Arm)   Pulse 82   Temp 98.3 F (36.8 C) (Oral)   Resp 18   SpO2 97%  Physical Exam Vitals and nursing note reviewed.  Constitutional:      Appearance: Normal appearance.  HENT:     Head: Normocephalic.     Comments: There is a dressing in place to the center of his forehead.  Bleeding controlled. Pulmonary:     Effort: Pulmonary effort is normal.  Neurological:     General: No focal deficit present.     Mental Status: He is alert and oriented to person, place, and time.     Cranial Nerves: No cranial nerve deficit.     Motor: No weakness.     Coordination: Coordination normal.     ED Results / Procedures / Treatments   Labs (all labs ordered are listed, but only abnormal results are displayed) Labs Reviewed - No data to display  EKG None  Radiology CT Head Wo Contrast  Result Date: 01/14/2023 CLINICAL DATA:  Head trauma, minor (Age >= 65y) EXAM: CT HEAD WITHOUT CONTRAST TECHNIQUE: Contiguous axial images were obtained from the base of the skull through the vertex without intravenous contrast. RADIATION DOSE REDUCTION: This exam was performed according to the departmental dose-optimization program which includes automated exposure control, adjustment of the mA and/or kV according to patient size and/or use of iterative reconstruction technique. COMPARISON:  None Available. FINDINGS: Brain: No evidence of acute infarction, hemorrhage, hydrocephalus, extra-axial collection or mass lesion/mass effect. Prominence of the extra-axial CSF spaces likely due to atrophy. Mild periventricular white matter hypodensity typical of chronic small vessel ischemia. Vascular: No hyperdense vessel or unexpected calcification. Skull: No fracture or focal lesion. Sinuses/Orbits: No acute finding. Other: Small left frontal scalp hematoma with laceration. IMPRESSION: 1. No acute intracranial abnormality. 2. Small left frontal scalp hematoma with laceration. No calvarial fracture.  Electronically Signed   By: Narda Rutherford M.D.   On: 01/14/2023 22:08    Procedures Procedures    Medications Ordered in ED Medications - No data to display  ED Course/ Medical Decision Making/ A&P  Patient presenting for CT scan as described in the HPI.  Patient sustained a laceration to his forehead when a log that he was cutting with a chainsaw snapped back and struck him in the head.  Patient has no complaints and is clinically well-appearing.  He is neurologically intact and CT scan is negative.  Patient to be discharged with local wound care and as needed return.  Final Clinical Impression(s) / ED Diagnoses Final diagnoses:  None    Rx / DC Orders ED Discharge Orders     None         Geoffery Lyons, MD 01/15/23 0006

## 2023-01-15 NOTE — Discharge Instructions (Signed)
Local wound care with bacitracin and dressing changes twice daily.  Sutures are to be removed in 7 days.  Please follow-up with your primary doctor for this.  Return to the emergency department if you develop any new and/or concerning issues.

## 2023-01-17 ENCOUNTER — Other Ambulatory Visit: Payer: Self-pay

## 2023-01-17 DIAGNOSIS — S3992XD Unspecified injury of lower back, subsequent encounter: Secondary | ICD-10-CM | POA: Diagnosis not present

## 2023-01-17 MED ORDER — OXYCODONE HCL 5 MG PO TABS
5.0000 mg | ORAL_TABLET | Freq: Three times a day (TID) | ORAL | 0 refills | Status: DC | PRN
  Filled 2023-01-17 – 2023-01-20 (×3): qty 21, 7d supply, fill #0

## 2023-01-18 DIAGNOSIS — D485 Neoplasm of uncertain behavior of skin: Secondary | ICD-10-CM | POA: Diagnosis not present

## 2023-01-18 DIAGNOSIS — Z1283 Encounter for screening for malignant neoplasm of skin: Secondary | ICD-10-CM | POA: Diagnosis not present

## 2023-01-18 DIAGNOSIS — L988 Other specified disorders of the skin and subcutaneous tissue: Secondary | ICD-10-CM | POA: Diagnosis not present

## 2023-01-20 ENCOUNTER — Other Ambulatory Visit: Payer: Self-pay

## 2023-02-09 DIAGNOSIS — I10 Essential (primary) hypertension: Secondary | ICD-10-CM | POA: Diagnosis not present

## 2023-02-09 DIAGNOSIS — D509 Iron deficiency anemia, unspecified: Secondary | ICD-10-CM | POA: Diagnosis not present

## 2023-02-10 DIAGNOSIS — F419 Anxiety disorder, unspecified: Secondary | ICD-10-CM | POA: Diagnosis not present

## 2023-02-10 DIAGNOSIS — K219 Gastro-esophageal reflux disease without esophagitis: Secondary | ICD-10-CM | POA: Diagnosis not present

## 2023-02-10 DIAGNOSIS — M545 Low back pain, unspecified: Secondary | ICD-10-CM | POA: Diagnosis not present

## 2023-02-10 DIAGNOSIS — R109 Unspecified abdominal pain: Secondary | ICD-10-CM | POA: Diagnosis not present

## 2023-02-10 DIAGNOSIS — D509 Iron deficiency anemia, unspecified: Secondary | ICD-10-CM | POA: Diagnosis not present

## 2023-02-10 DIAGNOSIS — G4700 Insomnia, unspecified: Secondary | ICD-10-CM | POA: Diagnosis not present

## 2023-02-10 DIAGNOSIS — E7800 Pure hypercholesterolemia, unspecified: Secondary | ICD-10-CM | POA: Diagnosis not present

## 2023-02-10 DIAGNOSIS — I10 Essential (primary) hypertension: Secondary | ICD-10-CM | POA: Diagnosis not present

## 2023-02-10 DIAGNOSIS — Z0000 Encounter for general adult medical examination without abnormal findings: Secondary | ICD-10-CM | POA: Diagnosis not present

## 2023-02-10 DIAGNOSIS — I700 Atherosclerosis of aorta: Secondary | ICD-10-CM | POA: Diagnosis not present

## 2023-02-10 DIAGNOSIS — R972 Elevated prostate specific antigen [PSA]: Secondary | ICD-10-CM | POA: Diagnosis not present

## 2023-02-16 DIAGNOSIS — S0502XA Injury of conjunctiva and corneal abrasion without foreign body, left eye, initial encounter: Secondary | ICD-10-CM | POA: Diagnosis not present

## 2023-02-16 DIAGNOSIS — H18822 Corneal disorder due to contact lens, left eye: Secondary | ICD-10-CM | POA: Diagnosis not present

## 2023-03-09 DIAGNOSIS — S0502XA Injury of conjunctiva and corneal abrasion without foreign body, left eye, initial encounter: Secondary | ICD-10-CM | POA: Diagnosis not present

## 2023-03-09 DIAGNOSIS — H18822 Corneal disorder due to contact lens, left eye: Secondary | ICD-10-CM | POA: Diagnosis not present

## 2023-04-12 DIAGNOSIS — R972 Elevated prostate specific antigen [PSA]: Secondary | ICD-10-CM | POA: Diagnosis not present

## 2023-04-12 DIAGNOSIS — R109 Unspecified abdominal pain: Secondary | ICD-10-CM | POA: Diagnosis not present

## 2023-04-12 DIAGNOSIS — K5732 Diverticulitis of large intestine without perforation or abscess without bleeding: Secondary | ICD-10-CM | POA: Diagnosis not present

## 2023-04-12 DIAGNOSIS — R1084 Generalized abdominal pain: Secondary | ICD-10-CM | POA: Diagnosis not present

## 2023-04-12 DIAGNOSIS — N402 Nodular prostate without lower urinary tract symptoms: Secondary | ICD-10-CM | POA: Diagnosis not present

## 2023-04-14 ENCOUNTER — Other Ambulatory Visit: Payer: Self-pay | Admitting: Urology

## 2023-04-14 DIAGNOSIS — R10A0 Flank pain, unspecified side: Secondary | ICD-10-CM

## 2023-04-14 DIAGNOSIS — N402 Nodular prostate without lower urinary tract symptoms: Secondary | ICD-10-CM

## 2023-04-14 DIAGNOSIS — R972 Elevated prostate specific antigen [PSA]: Secondary | ICD-10-CM

## 2023-04-14 DIAGNOSIS — R109 Unspecified abdominal pain: Secondary | ICD-10-CM

## 2023-05-27 ENCOUNTER — Ambulatory Visit
Admission: RE | Admit: 2023-05-27 | Discharge: 2023-05-27 | Disposition: A | Discharge status: Home / Self Care | Payer: Medicare Other | Source: Ambulatory Visit | Attending: Urology | Admitting: Urology

## 2023-05-27 DIAGNOSIS — R972 Elevated prostate specific antigen [PSA]: Secondary | ICD-10-CM

## 2023-05-27 DIAGNOSIS — N402 Nodular prostate without lower urinary tract symptoms: Secondary | ICD-10-CM

## 2023-05-27 DIAGNOSIS — R109 Unspecified abdominal pain: Secondary | ICD-10-CM

## 2023-05-27 MED ORDER — GADOPICLENOL 0.5 MMOL/ML IV SOLN
7.5000 mL | Freq: Once | INTRAVENOUS | Status: AC | PRN
  Administered 2023-05-27: 7.5 mL via INTRAVENOUS

## 2023-06-02 ENCOUNTER — Other Ambulatory Visit: Payer: Self-pay | Admitting: Family Medicine

## 2023-06-02 DIAGNOSIS — R918 Other nonspecific abnormal finding of lung field: Secondary | ICD-10-CM

## 2023-06-06 DIAGNOSIS — R1084 Generalized abdominal pain: Secondary | ICD-10-CM | POA: Diagnosis not present

## 2023-06-06 DIAGNOSIS — R3914 Feeling of incomplete bladder emptying: Secondary | ICD-10-CM | POA: Diagnosis not present

## 2023-06-06 DIAGNOSIS — N401 Enlarged prostate with lower urinary tract symptoms: Secondary | ICD-10-CM | POA: Diagnosis not present

## 2023-06-06 DIAGNOSIS — R350 Frequency of micturition: Secondary | ICD-10-CM | POA: Diagnosis not present

## 2023-06-09 DIAGNOSIS — R109 Unspecified abdominal pain: Secondary | ICD-10-CM | POA: Diagnosis not present

## 2023-06-09 DIAGNOSIS — M545 Low back pain, unspecified: Secondary | ICD-10-CM | POA: Diagnosis not present

## 2023-06-09 DIAGNOSIS — S3992XD Unspecified injury of lower back, subsequent encounter: Secondary | ICD-10-CM | POA: Diagnosis not present

## 2023-06-10 DIAGNOSIS — T85898A Other specified complication of other internal prosthetic devices, implants and grafts, initial encounter: Secondary | ICD-10-CM | POA: Diagnosis not present

## 2023-06-10 DIAGNOSIS — K660 Peritoneal adhesions (postprocedural) (postinfection): Secondary | ICD-10-CM | POA: Diagnosis not present

## 2023-06-10 DIAGNOSIS — R351 Nocturia: Secondary | ICD-10-CM | POA: Diagnosis not present

## 2023-06-10 DIAGNOSIS — R972 Elevated prostate specific antigen [PSA]: Secondary | ICD-10-CM | POA: Diagnosis not present

## 2023-06-10 DIAGNOSIS — R3914 Feeling of incomplete bladder emptying: Secondary | ICD-10-CM | POA: Diagnosis not present

## 2023-06-10 DIAGNOSIS — N401 Enlarged prostate with lower urinary tract symptoms: Secondary | ICD-10-CM | POA: Diagnosis not present

## 2023-06-10 DIAGNOSIS — R1032 Left lower quadrant pain: Secondary | ICD-10-CM | POA: Diagnosis not present

## 2023-06-24 ENCOUNTER — Ambulatory Visit
Admission: RE | Admit: 2023-06-24 | Discharge: 2023-06-24 | Disposition: A | Discharge status: Home / Self Care | Source: Ambulatory Visit | Attending: Family Medicine | Admitting: Family Medicine

## 2023-06-24 DIAGNOSIS — R918 Other nonspecific abnormal finding of lung field: Secondary | ICD-10-CM

## 2023-07-05 ENCOUNTER — Encounter (HOSPITAL_BASED_OUTPATIENT_CLINIC_OR_DEPARTMENT_OTHER): Payer: Self-pay | Admitting: Physical Therapy

## 2023-07-05 ENCOUNTER — Other Ambulatory Visit: Payer: Self-pay

## 2023-07-05 ENCOUNTER — Ambulatory Visit (HOSPITAL_BASED_OUTPATIENT_CLINIC_OR_DEPARTMENT_OTHER): Attending: Family Medicine | Admitting: Physical Therapy

## 2023-07-05 DIAGNOSIS — G8929 Other chronic pain: Secondary | ICD-10-CM | POA: Insufficient documentation

## 2023-07-05 DIAGNOSIS — M5459 Other low back pain: Secondary | ICD-10-CM | POA: Insufficient documentation

## 2023-07-05 DIAGNOSIS — M546 Pain in thoracic spine: Secondary | ICD-10-CM | POA: Diagnosis not present

## 2023-07-05 DIAGNOSIS — M25511 Pain in right shoulder: Secondary | ICD-10-CM | POA: Diagnosis not present

## 2023-07-05 DIAGNOSIS — R293 Abnormal posture: Secondary | ICD-10-CM | POA: Diagnosis not present

## 2023-07-05 DIAGNOSIS — M25512 Pain in left shoulder: Secondary | ICD-10-CM | POA: Insufficient documentation

## 2023-07-05 NOTE — Therapy (Signed)
 OUTPATIENT PHYSICAL THERAPY THORACOLUMBAR EVALUATION   Patient Name: Miguel [name] MRN: [id number] DOB:05-31-1950, 73 y.o., male Today's Date: 07/06/2023  END OF SESSION:  PT End of Session - 07/06/23 0822     Visit Number 1    Number of Visits 16    Date for PT Re-Evaluation 08/31/23    PT Start Time 0930    PT Stop Time 1013    PT Time Calculation (min) 43 min    Activity Tolerance Patient tolerated treatment well    Behavior During Therapy WFL for tasks assessed/performed             Past Medical History:  Diagnosis Date   Anemia    Anxiety    BPH (benign prostatic hyperplasia)    Gastritis    GERD (gastroesophageal reflux disease)    "seldom" (07/16/2016)   GI bleed due to NSAIDs 10/27/2015   History of blood transfusion 06/2016   post OR/notes 07/15/2016   History of hiatal hernia    History of kidney stones    surgery to remove stone   Hyperlipidemia    Hypertension    no meds    Melanoma of back (HCC)    "mid back"   Sigmoid diverticulitis    with perforation   Past Surgical History:  Procedure Laterality Date   COLON SURGERY     sigmoid   COLOSTOMY TAKEDOWN N/A 06/28/2016   Procedure: COLOSTOMY TAKEDOWN;  Surgeon: [name], MD;  Location: [facility] OR;  Service: General;  Laterality: N/A;   CYSTOSCOPY WITH RETROGRADE PYELOGRAM, URETEROSCOPY AND STENT PLACEMENT Bilateral 02/12/2020   Procedure: CYSTOSCOPY WITH BILATERAL RETROGRADE PYELOGRAM,  AND LITHOPEXY;  Surgeon: Belva Agee, MD;  Location: [facility] ORS;  Service: Urology;  Laterality: Bilateral;  1 HR   ESOPHAGOGASTRODUODENOSCOPY N/A 10/27/2015   Procedure: ESOPHAGOGASTRODUODENOSCOPY (EGD);  Surgeon: Vida Rigger, MD;  Location: [facility] ENDOSCOPY;  Service: Endoscopy;  Laterality: N/A;   ESOPHAGOGASTRODUODENOSCOPY N/A 10/29/2015   Procedure: ESOPHAGOGASTRODUODENOSCOPY (EGD);  Surgeon: Bernette Redbird, MD;  Location: [facility] ENDOSCOPY;  Service: Endoscopy;  Laterality: N/A;   ESOPHAGOGASTRODUODENOSCOPY N/A 10/30/2015    Procedure: ESOPHAGOGASTRODUODENOSCOPY (EGD);  Surgeon: Vida Rigger, MD;  Location: [facility] ENDOSCOPY;  Service: Endoscopy;  Laterality: N/A;   g tube discontinued  04/2020   per patient   GASTROSTOMY TUBE PLACEMENT  11/21/2015   REDUCTION OF HIATAL HERNIA , REPAIR HIATAL HERNIA, RESECTION SMALL BOWEL WITH ANASTOMOSIS, PLACEMENT GASTROSTOMY TUBE, PLACEMENT DUODENOSTOMY TUBE (N/A)   HEMORRHOID BANDING  X 2   HERNIA REPAIR     HIATAL HERNIA REPAIR N/A 11/21/2015   Procedure: REDUCTION OF HIATAL HERNIA , REPAIR HIATAL HERNIA, RESECTION SMALL BOWEL WITH ANASTOMOSIS, PLACEMENT GASTROSTOMY TUBE, PLACEMENT DUODENOSTOMY TUBE;  Surgeon: Rodman Pickle, MD;  Location: [facility] OR;  Service: General;  Laterality: N/A;   HOLMIUM LASER APPLICATION Right 02/12/2020   Procedure: HOLMIUM LASER APPLICATION;  Surgeon: Belva Agee, MD;  Location: [facility] ORS;  Service: Urology;  Laterality: Right;   INCISIONAL HERNIA REPAIR  06/28/2016   open/notes 07/15/2016   INCISIONAL HERNIA REPAIR  03/17/2018   WITH MESH   INCISIONAL HERNIA REPAIR N/A 03/17/2018   Procedure: INCISIONAL HERNIA REPAIR WITH MESH;  Surgeon: [name], MD;  Location: [facility] OR;  Service: General;  Laterality: N/A;   INCISIONAL HERNIA REPAIR N/A 10/16/2020   Procedure: Sherald Hess HERNIA REPAIR WITH MESH;  Surgeon: [name], MD;  Location: [facility] OR;  Service: General;  Laterality: N/A;   INGUINAL HERNIA REPAIR Bilateral 09/28/2018   INGUINAL HERNIA  REPAIR Bilateral 09/28/2018   Procedure: BILATERAL OPEN INGUINAL HERNIA REPAIR WITH MESH;  Surgeon: [name], MD;  Location: [facility] OR;  Service: General;  Laterality: Bilateral;  GENERAL AND TAP BLOCK   INSERTION OF MESH N/A 03/17/2018   Procedure: INSERTION OF MESH;  Surgeon: [name], MD;  Location: [facility] OR;  Service: General;  Laterality: N/A;   INSERTION OF MESH Bilateral 09/28/2018   Procedure: Insertion Of Mesh;  Surgeon: [name], MD;  Location: [facility] OR;  Service: General;   Laterality: Bilateral;   IR CM INJ ANY COLONIC TUBE W/FLUORO  02/04/2017   IR GUIDED DRAIN W CATHETER PLACEMENT  07/06/2016   /NOTES 07/15/2016   IR PATIENT EVAL TECH 0-60 MINS  06/28/2019   IR RADIOLOGIST EVAL & MGMT  07/27/2016   IR RADIOLOGIST EVAL & MGMT  08/17/2016   IR RADIOLOGIST EVAL & MGMT  08/26/2016   IR REPLACE G-TUBE SIMPLE WO FLUORO  07/28/2017   IR REPLACE G-TUBE SIMPLE WO FLUORO  01/31/2018   IR REPLACE G-TUBE SIMPLE WO FLUORO  10/16/2018   IR REPLACE G-TUBE SIMPLE WO FLUORO  03/05/2019   IR REPLACE G-TUBE SIMPLE WO FLUORO  08/22/2019   IR REPLACE G-TUBE SIMPLE WO FLUORO  01/08/2020   IR REPLC GASTRO/COLONIC TUBE PERCUT W/FLUORO  08/04/2016   IR REPLC GASTRO/COLONIC TUBE PERCUT W/FLUORO  01/14/2017   IR US GUIDE BX ASP/DRAIN  08/04/2016   KNEE CARTILAGE SURGERY Right 1971   "opened me up"   LAPAROTOMY N/A 07/05/2015   Procedure: PARTIAL SIGMOID COLECTOMY AND COLOSTOMY;  Surgeon: [name], MD;  Location: [facility] OR;  Service: General;  Laterality: N/A;   MELANOMA EXCISION  2001   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  10/30/2015   Procedure: REMOVAL OF DISTAL STOMACH;  Surgeon: Jimmye Norman, MD;  Location: [facility] OR;  Service: General;;   REPAIR OF PERFORATED ULCER N/A 10/30/2015   Procedure: REPAIR OF BLEEDING  ULCER;  Surgeon: Jimmye Norman, MD;  Location: [facility] OR;  Service: General;  Laterality: N/A;   TUMOR EXCISION  2009   "back; fatty tumor"   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   Bilateral inguinal hernia 09/28/2018   Incisional hernia 03/17/2018   Trigger thumb, left thumb 01/19/2018   Trigger finger, left index finger 07/18/2017   Trigger finger, right ring finger 01/31/2017   Trigger finger of left thumb 01/03/2017   Trigger finger of right thumb 01/03/2017   Trigger index finger of left hand 01/03/2017   Trigger index finger of right hand 01/03/2017   Malnutrition  of moderate degree 07/19/2016   Intra-abdominal abscess (HCC) 07/16/2016   Cellulitis 07/15/2016   S/P colostomy takedown 06/28/2016   Pain in thoracic spine 06/09/2016   Mid back pain 06/09/2016   Postoperative fever 11/19/2015   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   Severe protein-calorie malnutrition (HCC) 11/17/2015   Sepsis (HCC) 11/14/2015   Hiccups 11/14/2015   AKI (acute kidney injury) (HCC) 11/14/2015   Fever    Leg swelling    Left shoulder pain    Muscle spasm of left shoulder    GI bleed 10/27/2015   Acute blood loss anemia 10/27/2015   Syncope 10/27/2015   Hyperglycemia 10/27/2015   Hypotension 10/27/2015   Neck pain 10/27/2015   Hematemesis 10/27/2015   Hematochezia 10/27/2015   Diverticulitis of colon with perforation 07/05/2015    PCP: Rosalio Macadamia  MD   REFERRING PROVIDER: Irven Coe MD   REFERRING DIAG:  DiagnosisM54.50 (ICD-10-CM) - Low back pain, unspecified  Rationale for Evaluation and Treatment: Rehabilitation  THERAPY DIAG:  Other low back pain  Pain in thoracic spine  Abnormal posture  ONSET DATE: 1 month prior   SUBJECTIVE:                                                                                                                                                                                           SUBJECTIVE STATEMENT: The patient had an acute onset of right sided low back pain. He was put on prednisone and it improved.Marland Kitchen   PERTINENT HISTORY:  Anxiety, anemia, kidney stones,  PAIN:  Are you having pain? Yes: NPRS scale: 5/10 Pain location: left low back  Pain description: aching/constant  Aggravating factors: Standing and walking  Relieving factors: rest   PRECAUTIONS: None  RED FLAGS: None   WEIGHT BEARING RESTRICTIONS: No  FALLS:  Has patient fallen in last 6 months? No  LIVING ENVIRONMENT: Nothing pertinent   OCCUPATION:  Retired   Presenter, broadcasting:  Yard work   PLOF: Independent  PATIENT GOALS:  To have less  pain To get stronger    NEXT MD VISIT:  Nothing scheduled.   OBJECTIVE:  Note: Objective measures were completed at Evaluation unless otherwise noted.  DIAGNOSTIC FINDINGS:  MRI 2022   IMPRESSION: 1. Multilevel spondylosis of the lumbar spine as described. 2. Mild foraminal narrowing bilaterally at L1-2 and L2-3 is worse on the left. Slight retrolisthesis and uncovering of a broad-based disc protrusion contribute at both levels. 3. Mild central and bilateral foraminal narrowing at L3-4 secondary to a broad-based disc protrusion and facet hypertrophy. 4. Mild central and mild to moderate foraminal narrowing bilaterally at L4-5 is worse right than left. 5. Moderate left and mild right foraminal stenosis at L5-S1 due to endplate and facet spurring. 6. Cholelithiasis without evidence for cholecystitis.  PATIENT SURVEYS:  LEFS 49/80  COGNITION: Overall cognitive status: Within functional limits for tasks assessed     SENSATION: WFL    POSTURE: rounded shoulders, forward head, and flexed trunk   PALPATION: Significant spasming in left paraspinals and gluteal   LUMBAR ROM:   AROM eval  Flexion Limited 25% with pain   Extension Painful past neutral   Right lateral flexion   Left lateral flexion   Right rotation   Left rotation    (Blank rows = not tested)  LOWER EXTREMITY ROM:     LOWER EXTREMITY MMT:    MMT Right eval Left eval  Hip flexion 17.5 16.3  Hip extension  Hip abduction 28.7 26.3  Hip adduction    Hip internal rotation    Hip external rotation    Knee flexion    Knee extension 34.3 32.1  Ankle dorsiflexion    Ankle plantarflexion    Ankle inversion    Ankle eversion     (Blank rows = not tested)    GAIT: Flexed posture   TREATMENT DATE:   Manual: trigger point release to lumbar spine  Review of self trigger point release   Neuro re-ed   Bilateral ER 2x10 with cuing  Horizontal abduction 2x10                                                                                                                                       PATIENT EDUCATION:  Education details: HEP, symptom management  Person educated: Patient Education method: Explanation, Demonstration, Tactile cues, Verbal cues, and Handouts Education comprehension: verbalized understanding, returned demonstration, verbal cues required, tactile cues required, and needs further education  HOME EXERCISE PROGRAM: Access Code: [email or web address] URL: [email or web address] Date: 07/05/2023 Prepared by: Lorayne Bender  Exercises - Standing Glute Med Mobilization with Small Ball on Wall  - 1 x daily - 7 x weekly - 3 sets - 10 reps - Low Horizontal Abduction with Resistance  - 1 x daily - 7 x weekly - 3 sets - 10 reps - Shoulder External Rotation and Scapular Retraction with Resistance  - 1 x daily - 7 x weekly - 3 sets - 10 reps  ASSESSMENT:  CLINICAL IMPRESSION: Patient is a 73 year old male who presents with left-sided low back pain.  Earlier this month he had an acute onset of right-sided low back pain as well.  He had a course of prednisone which resolved this.  At this time he feels like most of his pain is back to being focused on the left side.  Has had success with physical therapy before.  He is not currently doing any of the exercises he was doing before.  He has increased pain with activity.  He has limited lumbar motion as well as limited bilateral lower extremity strength.  He has a long complicated history of abdominal surgeries.  He currently has mesh that causes him pain with activity.  He would benefit from skilled therapy to improve core strength and stability, reduce pain in lower back, and reviewed total program future exacerbations of pain OBJECTIVE IMPAIRMENTS: Abnormal gait, decreased activity tolerance, decreased endurance, decreased ROM, decreased strength, improper body mechanics, and postural dysfunction.   ACTIVITY LIMITATIONS:  carrying, lifting, bending, standing, squatting, sleeping, stairs, and transfers  PARTICIPATION LIMITATIONS: meal prep, cleaning, laundry, shopping, community activity, occupation, and yard work  PERSONAL FACTORS: 1-2 comorbidities: Multiple abdominal surgeries, history of kidney stones  are also affecting patient's functional outcome.   REHAB POTENTIAL: Good  CLINICAL DECISION MAKING: Evolving/moderate complexity  EVALUATION COMPLEXITY: Moderate   GOALS: Goals reviewed with patient? Yes  HORT TERM GOALS: Target date: 08/02/2023     Patient will increase lumbar flexion to full without pain Baseline: Goal status: INITIAL   2.  Patient will increase gross bilateral lower extremity strength by 5 pounds Baseline:  Goal status: INITIAL   3.  Patient will be independent and complaint with basic HEP  Baseline:  Goal status: INITIAL     LONG TERM GOALS: Target date: 08/30/2023     Patient will return to using the weed wacker without pain  Baseline:  Goal status: INITIAL   2.  Patient will reach overhead to a shelf without pain  Baseline:  Goal status: INITIAL   3.  Patient will show compliance to long term program for strengthening  Baseline:  Goal status: INITIAL  PLAN:  PT FREQUENCY: 2x/week  PT DURATION: 8 weeks  PLANNED INTERVENTIONS: 97110-Therapeutic exercises, 97530- Therapeutic activity, [id number]- Neuromuscular re-education, 97535- Self Care, [id number]- Manual therapy, [id number]- Gait training, [phone]- Aquatic Therapy, 97014- Electrical stimulation (unattended), 97035- Ultrasound, Patient/Family education, Stair training, Taping, Dry Needling, DME instructions, Cryotherapy, and Moist heat .  PLAN FOR NEXT SESSION: Consider trigger point dry needling of lumbar spine and gluteals.  Soft tissue mobilization to lumbar spine.  Consider LAD. Continue to expand home exercises and work on getting the patient to buy into home exercises.    Dessie Coma, PT 07/06/2023, 8:26 AM

## 2023-07-06 ENCOUNTER — Encounter (HOSPITAL_BASED_OUTPATIENT_CLINIC_OR_DEPARTMENT_OTHER): Payer: Self-pay | Admitting: Physical Therapy

## 2023-07-06 DIAGNOSIS — Z9889 Other specified postprocedural states: Secondary | ICD-10-CM | POA: Diagnosis not present

## 2023-07-06 DIAGNOSIS — M7918 Myalgia, other site: Secondary | ICD-10-CM | POA: Diagnosis not present

## 2023-07-06 DIAGNOSIS — G8929 Other chronic pain: Secondary | ICD-10-CM | POA: Diagnosis not present

## 2023-07-08 ENCOUNTER — Other Ambulatory Visit: Payer: Self-pay | Admitting: Family Medicine

## 2023-07-08 DIAGNOSIS — R918 Other nonspecific abnormal finding of lung field: Secondary | ICD-10-CM

## 2023-07-08 DIAGNOSIS — J3489 Other specified disorders of nose and nasal sinuses: Secondary | ICD-10-CM | POA: Diagnosis not present

## 2023-07-08 DIAGNOSIS — I10 Essential (primary) hypertension: Secondary | ICD-10-CM | POA: Diagnosis not present

## 2023-07-08 DIAGNOSIS — F419 Anxiety disorder, unspecified: Secondary | ICD-10-CM | POA: Diagnosis not present

## 2023-07-08 DIAGNOSIS — R109 Unspecified abdominal pain: Secondary | ICD-10-CM | POA: Diagnosis not present

## 2023-07-14 ENCOUNTER — Emergency Department (HOSPITAL_COMMUNITY)

## 2023-07-14 ENCOUNTER — Emergency Department (HOSPITAL_COMMUNITY)
Admission: EM | Admit: 2023-07-14 | Discharge: 2023-07-14 | Disposition: A | Discharge status: Home / Self Care | Attending: Emergency Medicine | Admitting: Emergency Medicine

## 2023-07-14 ENCOUNTER — Other Ambulatory Visit: Payer: Self-pay

## 2023-07-14 DIAGNOSIS — R079 Chest pain, unspecified: Secondary | ICD-10-CM | POA: Diagnosis not present

## 2023-07-14 DIAGNOSIS — R110 Nausea: Secondary | ICD-10-CM | POA: Diagnosis not present

## 2023-07-14 DIAGNOSIS — K8050 Calculus of bile duct without cholangitis or cholecystitis without obstruction: Secondary | ICD-10-CM | POA: Insufficient documentation

## 2023-07-14 DIAGNOSIS — R072 Precordial pain: Secondary | ICD-10-CM | POA: Diagnosis present

## 2023-07-14 DIAGNOSIS — R1084 Generalized abdominal pain: Secondary | ICD-10-CM | POA: Diagnosis not present

## 2023-07-14 DIAGNOSIS — D649 Anemia, unspecified: Secondary | ICD-10-CM | POA: Diagnosis not present

## 2023-07-14 DIAGNOSIS — I10 Essential (primary) hypertension: Secondary | ICD-10-CM | POA: Insufficient documentation

## 2023-07-14 DIAGNOSIS — R7401 Elevation of levels of liver transaminase levels: Secondary | ICD-10-CM | POA: Diagnosis not present

## 2023-07-14 DIAGNOSIS — R0789 Other chest pain: Secondary | ICD-10-CM | POA: Diagnosis not present

## 2023-07-14 DIAGNOSIS — K8020 Calculus of gallbladder without cholecystitis without obstruction: Secondary | ICD-10-CM | POA: Diagnosis not present

## 2023-07-14 DIAGNOSIS — K5730 Diverticulosis of large intestine without perforation or abscess without bleeding: Secondary | ICD-10-CM | POA: Diagnosis not present

## 2023-07-14 DIAGNOSIS — Q600 Renal agenesis, unilateral: Secondary | ICD-10-CM | POA: Diagnosis not present

## 2023-07-14 LAB — CBC WITH DIFFERENTIAL/PLATELET
Abs Immature Granulocytes: 0.02 10*3/uL (ref 0.00–0.07)
Basophils Absolute: 0 10*3/uL (ref 0.0–0.1)
Basophils Relative: 0 %
Eosinophils Absolute: 0.1 10*3/uL (ref 0.0–0.5)
Eosinophils Relative: 1 %
HCT: 36 % — ABNORMAL LOW (ref 39.0–52.0)
Hemoglobin: 12.1 g/dL — ABNORMAL LOW (ref 13.0–17.0)
Immature Granulocytes: 0 %
Lymphocytes Relative: 12 %
Lymphs Abs: 1 10*3/uL (ref 0.7–4.0)
MCH: 31.7 pg (ref 26.0–34.0)
MCHC: 33.6 g/dL (ref 30.0–36.0)
MCV: 94.2 fL (ref 80.0–100.0)
Monocytes Absolute: 0.9 10*3/uL (ref 0.1–1.0)
Monocytes Relative: 10 %
Neutro Abs: 6.8 10*3/uL (ref 1.7–7.7)
Neutrophils Relative %: 77 %
Platelets: 234 10*3/uL (ref 150–400)
RBC: 3.82 MIL/uL — ABNORMAL LOW (ref 4.22–5.81)
RDW: 13.2 % (ref 11.5–15.5)
WBC: 8.8 10*3/uL (ref 4.0–10.5)
nRBC: 0 % (ref 0.0–0.2)

## 2023-07-14 LAB — COMPREHENSIVE METABOLIC PANEL WITH GFR
ALT: 180 U/L — ABNORMAL HIGH (ref 0–44)
AST: 477 U/L — ABNORMAL HIGH (ref 15–41)
Albumin: 3.7 g/dL (ref 3.5–5.0)
Alkaline Phosphatase: 223 U/L — ABNORMAL HIGH (ref 38–126)
Anion gap: 11 (ref 5–15)
BUN: 15 mg/dL (ref 8–23)
CO2: 25 mmol/L (ref 22–32)
Calcium: 8.6 mg/dL — ABNORMAL LOW (ref 8.9–10.3)
Chloride: 99 mmol/L (ref 98–111)
Creatinine, Ser: 0.87 mg/dL (ref 0.61–1.24)
GFR, Estimated: >60 mL/min (ref >60)
Glucose, Bld: 116 mg/dL — ABNORMAL HIGH (ref 70–99)
Potassium: 3.9 mmol/L (ref 3.5–5.1)
Sodium: 135 mmol/L (ref 135–145)
Total Bilirubin: 0.9 mg/dL (ref 0.0–1.2)
Total Protein: 6.2 g/dL — ABNORMAL LOW (ref 6.5–8.1)

## 2023-07-14 LAB — TROPONIN I (HIGH SENSITIVITY)
Troponin I (High Sensitivity): 5 ng/L (ref <18)
Troponin I (High Sensitivity): 5 ng/L (ref <18)

## 2023-07-14 LAB — LIPASE, BLOOD: Lipase: 28 U/L (ref 11–51)

## 2023-07-14 MED ORDER — IOHEXOL 350 MG/ML SOLN
100.0000 mL | Freq: Once | INTRAVENOUS | Status: AC | PRN
  Administered 2023-07-14: 100 mL via INTRAVENOUS

## 2023-07-14 MED ORDER — FENTANYL CITRATE PF 50 MCG/ML IJ SOSY
50.0000 ug | PREFILLED_SYRINGE | Freq: Once | INTRAMUSCULAR | Status: AC
  Administered 2023-07-14: 50 ug via INTRAVENOUS
  Filled 2023-07-14: qty 1

## 2023-07-14 NOTE — ED Triage Notes (Signed)
 According to [facility]: We came home, with chest and abdominal pain.Startted 45 minutes ago woke him up from a nap. Extensive history of abdominal surgeries. Reporting its pressure from sub sternal notch straight back to his back.Pt is on multiple pain medicines (oxycodone, tramadol, and hydrocodone).  12 lead showd no st elevation, he is ht.  Vital BP 190/103 (both arms) HR 91 RR 16 Spo2 98%  Ems placed 20 gauge piv in left ac.

## 2023-07-14 NOTE — ED Provider Notes (Signed)
 [place] EMERGENCY DEPARTMENT AT [facility] Provider Note   CSN: [id number] Arrival date & time: 07/14/23  4098     History  No chief complaint on file.   Miguel [name] is a 73 y.o. male with past medical history of several GI surgeries, hypertension presents with chest pain.  Patient states that he ate [place] food today, began feeling unwell and around 4 PM began having substernal chest pain that radiated to his back.  He also had 1 episode of vomiting.  Denies any hematemesis.  States the pain is currently present.  Denies any history of CAD.  Denies any diaphoresis, shortness of breath, radiation of pain elsewhere to the arms or neck.    HPI     Home Medications Prior to Admission medications   Medication Sig Start Date End Date Taking? Authorizing Provider  acetaminophen (TYLENOL) 500 MG tablet Take 1 tablet (500 mg total) by mouth every 8 (eight) hours as needed for mild pain (for pain). Patient taking differently: Take 250-500 mg by mouth every 4 (four) hours as needed for mild pain (pain score 1-3) or fever. 12/05/15   Adam Phenix, PA-C  diazepam (VALIUM) 5 MG tablet Take one tablet by mouth with food one hour prior to procedure. May repeat 30 minutes prior if needed. 04/14/21   Juanda Chance, NP  diclofenac sodium (VOLTAREN) 1 % GEL Apply 2 g topically 3 (three) times daily as needed (joint pain).     [provider]  famotidine (PEPCID) 20 MG tablet Take 20 mg by mouth 2 (two) times daily.     [provider]  HYDROcodone-acetaminophen (NORCO/VICODIN) 5-325 MG tablet Take 1 tablet by mouth 2 (two) times daily as needed for moderate pain. 07/06/21   Kathryne Hitch, MD  LORazepam (ATIVAN) 1 MG tablet Take 0.5 mg by mouth daily as needed for anxiety. 05/29/18   [provider]  methocarbamol (ROBAXIN) 500 MG tablet Take 250-500 mg by mouth every 8 (eight) hours as needed for muscle spasms.  07/11/18   [provider]   methylPREDNISolone (MEDROL) 4 MG tablet Medrol dose pack. Take as instructed 11/24/20   Kathryne Hitch, MD  Multiple Vitamin (MULTIVITAMIN WITH MINERALS) TABS tablet Take 1 tablet by mouth daily.    [provider]  ondansetron (ZOFRAN-ODT) 4 MG disintegrating tablet Take 4 mg by mouth every 6 (six) hours as needed for nausea/vomiting. 07/11/18   [provider]  oxyCODONE (OXY IR/ROXICODONE) 5 MG immediate release tablet Take 1 tablet (5 mg total) by mouth every 6 (six) hours as needed for moderate pain, severe pain or breakthrough pain. 10/20/20   [name], MD  oxyCODONE (OXY IR/ROXICODONE) 5 MG immediate release tablet Take 1 tablet (5 mg total) by mouth every 8 (eight) hours as needed. 01/17/23     pantoprazole (PROTONIX) 40 MG tablet Take 40 mg by mouth 2 (two) times daily. 01/18/18   [provider]  polyethylene glycol (MIRALAX / GLYCOLAX) 17 g packet Take 17 g by mouth daily as needed for mild constipation.    [provider]  Probiotic Product (PROBIOTIC DAILY PO) Take 1 capsule by mouth daily.    [provider]  tamsulosin (FLOMAX) 0.4 MG CAPS capsule Take 1 capsule (0.4 mg total) by mouth daily. 01/04/16   Claud Kelp, MD  tiZANidine (ZANAFLEX) 2 MG tablet Take 1-2 mg by mouth 2 (two) times daily as needed for muscle spasms. 01/16/20   [provider]  traMADol (ULTRAM) 50 MG tablet Take 50 mg by mouth every 6 (six) hours as needed for moderate pain. 05/21/16   [provider]  vitamin B-12 (CYANOCOBALAMIN) 1000 MCG tablet Take 1,000 mcg by mouth daily.     [provider]  zolpidem (AMBIEN) 10 MG tablet Take 10 mg by mouth at bedtime.    [provider]      Allergies    Patient has no known allergies.    Review of Systems   Review of Systems  Physical Exam Updated Vital Signs BP (!) 175/95   Pulse 82   Temp 98.1 F (36.7 C) (Oral)   Resp 11   Ht 5\' 4"  (1.626 m)   Wt 62.6 kg    SpO2 100%   BMI 23.69 kg/m  Physical Exam Vitals and nursing note reviewed.  Constitutional:      General: He is not in acute distress.    Appearance: He is well-developed.  HENT:     Head: Normocephalic and atraumatic.     Right Ear: External ear normal.     Left Ear: External ear normal.  Eyes:     Conjunctiva/sclera: Conjunctivae normal.  Cardiovascular:     Rate and Rhythm: Normal rate and regular rhythm.     Pulses:          Radial pulses are 2+ on the right side and 2+ on the left side.     Heart sounds: No murmur heard. Pulmonary:     Effort: Pulmonary effort is normal. No respiratory distress.     Breath sounds: Normal breath sounds. No wheezing or rales.  Abdominal:     General: Abdomen is flat.     Palpations: Abdomen is soft.     Tenderness: There is no guarding or rebound.     Comments: Epigastric pain to palpation  Musculoskeletal:        General: No swelling.     Cervical back: Neck supple.     Right lower leg: No edema.     Left lower leg: No edema.  Skin:    General: Skin is warm and dry.     Capillary Refill: Capillary refill takes less than 2 seconds.  Neurological:     Mental Status: He is alert.  Psychiatric:        Mood and Affect: Mood normal.     ED Results / Procedures / Treatments   Labs (all labs ordered are listed, but only abnormal results are displayed) Labs Reviewed  CBC WITH DIFFERENTIAL/PLATELET - Abnormal; Notable for the following components:      Result Value   RBC 3.82 (*)    Hemoglobin 12.1 (*)    HCT 36.0 (*)    All other components within normal limits  COMPREHENSIVE METABOLIC PANEL WITH GFR - Abnormal; Notable for the following components:   Glucose, Bld 116 (*)    Calcium 8.6 (*)    Total Protein 6.2 (*)    AST 477 (*)    ALT 180 (*)    Alkaline Phosphatase 223 (*)    All other components within normal limits  LIPASE, BLOOD  TROPONIN I (HIGH SENSITIVITY)  TROPONIN I (HIGH SENSITIVITY)     EKG None  Radiology CT Angio Chest/Abd/Pel for Dissection W and/or Wo Contrast Result Date: 07/14/2023 CLINICAL DATA:  Rule out dissection, chest and abdominal pain EXAM: CT ANGIOGRAPHY CHEST, ABDOMEN AND PELVIS TECHNIQUE: Non-contrast CT of the chest was initially obtained. Multidetector CT imaging through the chest, abdomen  and pelvis was performed using the standard protocol during bolus administration of intravenous contrast. Multiplanar reconstructed images and MIPs were obtained and reviewed to evaluate the vascular anatomy. RADIATION DOSE REDUCTION: This exam was performed according to the departmental dose-optimization program which includes automated exposure control, adjustment of the mA and/or kV according to patient size and/or use of iterative reconstruction technique. CONTRAST:  OMNIPAQUE IOHEXOL 350 MG/ML SOLN COMPARISON:  CT chest, 06/24/2023 FINDINGS: CTA CHEST FINDINGS VASCULAR Aorta: Satisfactory opacification of the aorta. Normal contour and caliber of the thoracic aorta. No evidence of aneurysm, dissection, or other acute aortic pathology. Mild aortic atherosclerosis. Cardiovascular: No evidence of pulmonary embolism on limited non-tailored examination. Normal heart size. Left coronary artery calcifications. No pericardial effusion. Review of the MIP images confirms the above findings. NON VASCULAR Mediastinum/Nodes: No enlarged mediastinal, hilar, or axillary lymph nodes. Small hiatal hernia. 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 Thyroid gland, trachea, and esophagus demonstrate no significant findings. Lungs/Pleura: Lungs are clear. No pleural effusion or pneumothorax. Musculoskeletal: Scarring of the bilateral lung bases. No acute osseous findings. Review of the MIP images confirms the above findings. CTA ABDOMEN AND PELVIS FINDINGS VASCULAR Normal contour and caliber of the abdominal aorta. No evidence of aneurysm, dissection, or  other acute aortic pathology. Standard branching pattern of the abdominal aorta with solitary bilateral renal arteries. Review of the MIP images confirms the above findings. NON-VASCULAR Hepatobiliary: No solid liver abnormality is seen. Mildly distended gallbladder containing faintly calcified gallstones, appearance similar to prior examination. No gallbladder wall thickening or pericholecystic fluid. No biliary ductal dilatation. Pancreas: Unremarkable. No pancreatic ductal dilatation or surrounding inflammatory changes. Spleen: Normal in size without significant abnormality. Adrenals/Urinary Tract: Definitively benign fatty adenomatous thickening of the adrenal glands. Kidneys are normal, without renal calculi, solid lesion, or hydronephrosis. Bladder is unremarkable. Stomach/Bowel: Partial gastrectomy with gastrojejunostomy. No evidence of bowel wall thickening, distention, or inflammatory changes. Large burden of stool throughout the colon and rectum. Colonic diverticulosis. Lymphatic: No enlarged abdominal or pelvic lymph nodes. Reproductive: Severe prostatomegaly. Other: No abdominal wall hernia or abnormality. No ascites. Musculoskeletal: No acute osseous findings. IMPRESSION: 1. Normal contour and caliber of the thoracic and abdominal aorta. No evidence of aneurysm, dissection, or other acute aortic pathology. Mild aortic atherosclerosis. 2. Cholelithiasis without evidence of acute cholecystitis. 3. 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 4. Diverticulosis without evidence of acute diverticulitis. Large burden of stool. 5. Prostatomegaly. 6. Coronary artery disease. Aortic Atherosclerosis (ICD10-I70.0). Electronically Signed   By: Fredricka Jenny M.D.   On: 07/14/2023 21:35   DG Chest Portable 1 View Result Date: 07/14/2023 CLINICAL DATA:  Chest pain. EXAM: PORTABLE CHEST 1 VIEW COMPARISON:  10/12/2021. FINDINGS: Bilateral lung fields are clear. Bilateral costophrenic angles are clear.  Normal cardio-mediastinal silhouette. No acute osseous abnormalities. The soft tissues are within normal limits. IMPRESSION: No active disease. Electronically Signed   By: Beula Brunswick M.D.   On: 07/14/2023 17:49    Procedures Procedures    Medications Ordered in ED Medications  fentaNYL (SUBLIMAZE) injection 50 mcg (50 mcg Intravenous Given 07/14/23 1759)  iohexol (OMNIPAQUE) 350 MG/ML injection 100 mL (100 mLs Intravenous Contrast Given 07/14/23 2121)    ED Course/ Medical Decision Making/ A&P             HEART Score: 4                    Medical Decision Making Amount and/or Complexity  of Data Reviewed Labs: ordered. Radiology: ordered.  Risk Prescription drug management.   Patient is alert and in no acute distress.  Physical exam as noted above.  Differential includes ACS, dissection, gastritis, cholecystitis, pancreatitis, amongst other diagnoses.  Patient was given fentanyl for pain control.  Labs demonstrate no leukocytosis, hemoglobin 12.1, CMP with no severe electrolyte derangements, no AKI, AST 477, ALT 180, alk phos 223, bili 0.9.  First troponin 5, pending second troponin.  Lipase WNL.  I personally interpreted patient's chest x-ray, which demonstrated sinus rhythm with rate of 84, normal PR interval, QRS duration, slightly prolonged QTc of 456.  No ST elevations or depressions, no T wave inversions in concordant leads, no acute ischemic changes.  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  On EKG from 2017, LAFB noted. CTA study with unremarkable aorta, cholelithiasis without cholecystitis is appreciated, normal biliary duct.  While patient was pending remainder of labs, on reexamination he endorsed complete relief of pain.  On repeat abdominal exam, he has no pain to palpation.  He denies any history of IV drug use, denies any heavy alcohol use, states that he takes Tylenol intermittently for his chronic pain, taking up to 3000 g a day (up to 6 of 500 mg  tablets daily).  I spoke with Dr. Leighton Punches given concerns for transient choledocholithiasis. She will try to arrange follow-up in the next week.  Of priority would be reassessing CMP within a few days if patient does not want to be admitted.  Second troponin resulted at 5.  Moderate risk heart score, the patient's presentation today does not seem to be cardiac related.  I spoke with him and his wife at bedside about admission versus discharge with close outpatient follow-up.  Patient prefers the latter.  We discussed following up with his PCP tomorrow or Saturday for repeat laboratory testing for his liver enzymes.  He is agreeable to this plan.  We also discussed return precautions to the ED if he has any recurrence of his symptoms.  Patient was discharged in stable condition.        Final Clinical Impression(s) / ED Diagnoses Final diagnoses:  Biliary colic  Transaminitis    Rx / DC Orders ED Discharge Orders     None         Lorain Robson, MD 07/14/23 2313    Hershel Los, MD 07/14/23 2329

## 2023-07-14 NOTE — Discharge Instructions (Signed)
 You were seen in the ED today for chest pain and abdominal pain.  Your workup today suggested gallstones in your gallbladder, likely responsible for your pain.  Your liver enzymes were elevated.  It is important to have repeat liver enzyme testing over the next 2 days.  Please return the ED if you experience any recurrence of your abdominal pain, fevers, inability to eat without vomiting, or have any other emergency medical symptoms.

## 2023-07-15 DIAGNOSIS — K8050 Calculus of bile duct without cholangitis or cholecystitis without obstruction: Secondary | ICD-10-CM | POA: Diagnosis not present

## 2023-07-16 ENCOUNTER — Ambulatory Visit (HOSPITAL_BASED_OUTPATIENT_CLINIC_OR_DEPARTMENT_OTHER): Admitting: Physical Therapy

## 2023-07-25 ENCOUNTER — Ambulatory Visit (HOSPITAL_BASED_OUTPATIENT_CLINIC_OR_DEPARTMENT_OTHER): Admitting: Physical Therapy

## 2023-07-25 ENCOUNTER — Encounter (HOSPITAL_BASED_OUTPATIENT_CLINIC_OR_DEPARTMENT_OTHER): Payer: Self-pay | Admitting: Physical Therapy

## 2023-07-25 DIAGNOSIS — R293 Abnormal posture: Secondary | ICD-10-CM | POA: Diagnosis not present

## 2023-07-25 DIAGNOSIS — M25512 Pain in left shoulder: Secondary | ICD-10-CM | POA: Diagnosis not present

## 2023-07-25 DIAGNOSIS — M546 Pain in thoracic spine: Secondary | ICD-10-CM

## 2023-07-25 DIAGNOSIS — M5459 Other low back pain: Secondary | ICD-10-CM | POA: Diagnosis not present

## 2023-07-25 DIAGNOSIS — G8929 Other chronic pain: Secondary | ICD-10-CM | POA: Diagnosis not present

## 2023-07-25 DIAGNOSIS — M25511 Pain in right shoulder: Secondary | ICD-10-CM | POA: Diagnosis not present

## 2023-07-25 NOTE — Therapy (Unsigned)
 OUTPATIENT PHYSICAL THERAPY THORACOLUMBAR EVALUATION   Patient Name: Miguel [name] MRN: [id number] DOB:27-Jul-1950, 73 y.o., male Today's Date: 07/25/2023  END OF SESSION:  PT End of Session - 07/25/23 1107     Visit Number 2    Number of Visits 16    Date for PT Re-Evaluation 08/31/23    PT Start Time 1017    PT Stop Time 1059    PT Time Calculation (min) 42 min    Activity Tolerance Patient tolerated treatment well;No increased pain    Behavior During Therapy WFL for tasks assessed/performed              Past Medical History:  Diagnosis Date   Anemia    Anxiety    BPH (benign prostatic hyperplasia)    Gastritis    GERD (gastroesophageal reflux disease)    "seldom" (07/16/2016)   GI bleed due to NSAIDs 10/27/2015   History of blood transfusion 06/2016   post OR/notes 07/15/2016   History of hiatal hernia    History of kidney stones    surgery to remove stone   Hyperlipidemia    Hypertension    no meds    Melanoma of back (HCC)    "mid back"   Sigmoid diverticulitis    with perforation   Past Surgical History:  Procedure Laterality Date   COLON SURGERY     sigmoid   COLOSTOMY TAKEDOWN N/A 06/28/2016   Procedure: COLOSTOMY TAKEDOWN;  Surgeon: Oza Blumenthal, MD;  Location: [facility] OR;  Service: General;  Laterality: N/A;   CYSTOSCOPY WITH RETROGRADE PYELOGRAM, URETEROSCOPY AND STENT PLACEMENT Bilateral 02/12/2020   Procedure: CYSTOSCOPY WITH BILATERAL RETROGRADE PYELOGRAM,  AND LITHOPEXY;  Surgeon: Sherlyn Ditto, MD;  Location: [facility];  Service: Urology;  Laterality: Bilateral;  1 HR   ESOPHAGOGASTRODUODENOSCOPY N/A 10/27/2015   Procedure: ESOPHAGOGASTRODUODENOSCOPY (EGD);  Surgeon: Ozell Blunt, MD;  Location: [facility] ENDOSCOPY;  Service: Endoscopy;  Laterality: N/A;   ESOPHAGOGASTRODUODENOSCOPY N/A 10/29/2015   Procedure: ESOPHAGOGASTRODUODENOSCOPY (EGD);  Surgeon: Lanita Pitman, MD;  Location: [facility] ENDOSCOPY;  Service: Endoscopy;  Laterality: N/A;    ESOPHAGOGASTRODUODENOSCOPY N/A 10/30/2015   Procedure: ESOPHAGOGASTRODUODENOSCOPY (EGD);  Surgeon: Ozell Blunt, MD;  Location: [facility] ENDOSCOPY;  Service: Endoscopy;  Laterality: N/A;   g tube discontinued  04/2020   per patient   GASTROSTOMY TUBE PLACEMENT  11/21/2015   REDUCTION OF HIATAL HERNIA , REPAIR HIATAL HERNIA, RESECTION SMALL BOWEL WITH ANASTOMOSIS, PLACEMENT GASTROSTOMY TUBE, PLACEMENT DUODENOSTOMY TUBE (N/A)   HEMORRHOID BANDING  X 2   HERNIA REPAIR     HIATAL HERNIA REPAIR N/A 11/21/2015   Procedure: REDUCTION OF HIATAL HERNIA , REPAIR HIATAL HERNIA, RESECTION SMALL BOWEL WITH ANASTOMOSIS, PLACEMENT GASTROSTOMY TUBE, PLACEMENT DUODENOSTOMY TUBE;  Surgeon: Derral Flick, MD;  Location: [facility] OR;  Service: General;  Laterality: N/A;   HOLMIUM LASER APPLICATION Right 02/12/2020   Procedure: HOLMIUM LASER APPLICATION;  Surgeon: Sherlyn Ditto, MD;  Location: [facility];  Service: Urology;  Laterality: Right;   INCISIONAL HERNIA REPAIR  06/28/2016   open/notes 07/15/2016   INCISIONAL HERNIA REPAIR  03/17/2018   WITH MESH   INCISIONAL HERNIA REPAIR N/A 03/17/2018   Procedure: INCISIONAL HERNIA REPAIR WITH MESH;  Surgeon: Oza Blumenthal, MD;  Location: [facility] OR;  Service: General;  Laterality: N/A;   INCISIONAL HERNIA REPAIR N/A 10/16/2020   Procedure: Estrella Hench HERNIA REPAIR WITH MESH;  Surgeon: Oza Blumenthal, MD;  Location: [facility] OR;  Service: General;  Laterality: N/A;   INGUINAL HERNIA REPAIR Bilateral 09/28/2018  INGUINAL HERNIA REPAIR Bilateral 09/28/2018   Procedure: BILATERAL OPEN INGUINAL HERNIA REPAIR WITH MESH;  Surgeon: Oza Blumenthal, MD;  Location: [facility] OR;  Service: General;  Laterality: Bilateral;  GENERAL AND TAP BLOCK   INSERTION OF MESH N/A 03/17/2018   Procedure: INSERTION OF MESH;  Surgeon: Oza Blumenthal, MD;  Location: [facility] OR;  Service: General;  Laterality: N/A;   INSERTION OF MESH Bilateral 09/28/2018   Procedure: Insertion Of Mesh;  Surgeon: Oza Blumenthal,  MD;  Location: [facility] OR;  Service: General;  Laterality: Bilateral;   IR CM INJ ANY COLONIC TUBE W/FLUORO  02/04/2017   IR GUIDED DRAIN W CATHETER PLACEMENT  07/06/2016   /NOTES 07/15/2016   IR PATIENT EVAL TECH 0-60 MINS  06/28/2019   IR RADIOLOGIST EVAL & MGMT  07/27/2016   IR RADIOLOGIST EVAL & MGMT  08/17/2016   IR RADIOLOGIST EVAL & MGMT  08/26/2016   IR REPLACE G-TUBE SIMPLE WO FLUORO  07/28/2017   IR REPLACE G-TUBE SIMPLE WO FLUORO  01/31/2018   IR REPLACE G-TUBE SIMPLE WO FLUORO  10/16/2018   IR REPLACE G-TUBE SIMPLE WO FLUORO  03/05/2019   IR REPLACE G-TUBE SIMPLE WO FLUORO  08/22/2019   IR REPLACE G-TUBE SIMPLE WO FLUORO  01/08/2020   IR REPLC GASTRO/COLONIC TUBE PERCUT W/FLUORO  08/04/2016   IR REPLC GASTRO/COLONIC TUBE PERCUT W/FLUORO  01/14/2017   IR US  GUIDE BX ASP/DRAIN  08/04/2016   KNEE CARTILAGE SURGERY Right 1971   "opened me up"   LAPAROTOMY N/A 07/05/2015   Procedure: PARTIAL SIGMOID COLECTOMY AND COLOSTOMY;  Surgeon: Oza Blumenthal, MD;  Location: [facility] OR;  Service: General;  Laterality: N/A;   MELANOMA EXCISION  2001   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  10/30/2015   Procedure: REMOVAL OF DISTAL STOMACH;  Surgeon: Jerryl Morin, MD;  Location: [facility] OR;  Service: General;;   REPAIR OF PERFORATED ULCER N/A 10/30/2015   Procedure: REPAIR OF BLEEDING  ULCER;  Surgeon: Jerryl Morin, MD;  Location: [facility] OR;  Service: General;  Laterality: N/A;   TUMOR EXCISION  2009   "back; fatty tumor"   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   Bilateral inguinal hernia 09/28/2018   Incisional hernia 03/17/2018   Trigger thumb, left thumb 01/19/2018   Trigger finger, left index finger 07/18/2017   Trigger finger, right ring finger 01/31/2017   Trigger finger of left thumb 01/03/2017   Trigger finger of right thumb 01/03/2017   Trigger index finger of left hand 01/03/2017   Trigger index  finger of right hand 01/03/2017   Malnutrition of moderate degree 07/19/2016   Intra-abdominal abscess (HCC) 07/16/2016   Cellulitis 07/15/2016   S/P colostomy takedown 06/28/2016   Pain in thoracic spine 06/09/2016   Mid back pain 06/09/2016   Postoperative fever 11/19/2015   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   Severe protein-calorie malnutrition (HCC) 11/17/2015   Sepsis (HCC) 11/14/2015   Hiccups 11/14/2015   AKI (acute kidney injury) (HCC) 11/14/2015   Fever    Leg swelling    Left shoulder pain    Muscle spasm of left shoulder    GI bleed 10/27/2015   Acute blood loss anemia 10/27/2015   Syncope 10/27/2015   Hyperglycemia 10/27/2015   Hypotension 10/27/2015   Neck pain 10/27/2015   Hematemesis 10/27/2015   Hematochezia 10/27/2015   Diverticulitis of colon with perforation 07/05/2015    PCP:  Terris Fickle MD   REFERRING PROVIDER: Benedetto Brady MD   REFERRING DIAG:  DiagnosisM54.50 (ICD-10-CM) - Low back pain, unspecified  Rationale for Evaluation and Treatment: Rehabilitation  THERAPY DIAG:  No diagnosis found.  ONSET DATE: 1 month prior   SUBJECTIVE:                                                                                                                                                                                           SUBJECTIVE STATEMENT: 4/28 Pt came in and reported no pain. States the exercises have been going well and the back is feeling better.    The patient had an acute onset of right sided low back pain. He was put on prednisone and it improved.Aaron Aas   PERTINENT HISTORY:  Anxiety, anemia, kidney stones,  PAIN:  Are you having pain? Yes: NPRS scale: 5/10 Pain location: left low back  Pain description: aching/constant  Aggravating factors: Standing and walking  Relieving factors: rest   PRECAUTIONS: None  RED FLAGS: None   WEIGHT BEARING RESTRICTIONS: No  FALLS:  Has patient fallen in last 6 months? No  LIVING  ENVIRONMENT: Nothing pertinent   OCCUPATION:  Retired   Presenter, broadcasting:  Yard work   PLOF: Independent  PATIENT GOALS:  To have less pain To get stronger    NEXT MD VISIT:  Nothing scheduled.   OBJECTIVE:  Note: Objective measures were completed at Evaluation unless otherwise noted.  DIAGNOSTIC FINDINGS:  MRI 2022   IMPRESSION: 1. Multilevel spondylosis of the lumbar spine as described. 2. Mild foraminal narrowing bilaterally at L1-2 and L2-3 is worse on the left. Slight retrolisthesis and uncovering of a broad-based disc protrusion contribute at both levels. 3. Mild central and bilateral foraminal narrowing at L3-4 secondary to a broad-based disc protrusion and facet hypertrophy. 4. Mild central and mild to moderate foraminal narrowing bilaterally at L4-5 is worse right than left. 5. Moderate left and mild right foraminal stenosis at L5-S1 due to endplate and facet spurring. 6. Cholelithiasis without evidence for cholecystitis.  PATIENT SURVEYS:  LEFS 49/80  COGNITION: Overall cognitive status: Within functional limits for tasks assessed     SENSATION: WFL    POSTURE: rounded shoulders, forward head, and flexed trunk   PALPATION: Significant spasming in left paraspinals and gluteal   LUMBAR ROM:   AROM eval  Flexion Limited 25% with pain   Extension Painful past neutral   Right lateral flexion   Left lateral flexion   Right rotation   Left rotation    (Blank rows = not tested)  LOWER EXTREMITY ROM:     LOWER EXTREMITY MMT:  MMT Right eval Left eval  Hip flexion 17.5 16.3  Hip extension    Hip abduction 28.7 26.3  Hip adduction    Hip internal rotation    Hip external rotation    Knee flexion    Knee extension 34.3 32.1  Ankle dorsiflexion    Ankle plantarflexion    Ankle inversion    Ankle eversion     (Blank rows = not tested)    GAIT: Flexed posture   TREATMENT DATE:  4/28 Manual: All PROM performed with  distraction to reduce pain and improve movement Thoracic and lumbar PA's grade 2 Trigger point therapy bilateral lumbar paraspinals  Neuro-Re-ed  Horizontal ER yellow RTB 3x10 focusing on postural cueing and muscle activation  Banded rows yellow RTB 3x10 focusing on postural cueing and muscle activation  Banded abd yellow RTB 3x10 focusing on postural cueing and muscle activation    Manual: trigger point release to lumbar spine  Review of self trigger point release   Neuro re-ed   Bilateral ER 2x10 with cuing  Horizontal abduction 2x10                                                                                                                                      PATIENT EDUCATION:  Education details: HEP, symptom management  Person educated: Patient Education method: Explanation, Demonstration, Tactile cues, Verbal cues, and Handouts Education comprehension: verbalized understanding, returned demonstration, verbal cues required, tactile cues required, and needs further education  HOME EXERCISE PROGRAM: Access Code: [email or web address] URL: [email or web address] Date: 07/05/2023 Prepared by: Signa Drier  Exercises - Standing Glute Med Mobilization with Small Ball on Wall  - 1 x daily - 7 x weekly - 3 sets - 10 reps - Low Horizontal Abduction with Resistance  - 1 x daily - 7 x weekly - 3 sets - 10 reps - Shoulder External Rotation and Scapular Retraction with Resistance  - 1 x daily - 7 x weekly - 3 sets - 10 reps  ASSESSMENT:  CLINICAL IMPRESSION: 4/28 Pt warmed up on the exercise bike for with no increase in symptoms. Manual therapy was performed noting trigger point in left lumbar paraspinals around L3 region. Pt felt reduction in symptoms post manual therapy. Neuro re-ed exercises were performde focusing on technique, muscle activation, and postural cueing. Pt will continue to benefit from skilled physical therapy to increase functional strength and  ROM for functional ADL's.     Patient is a 73 year old male who presents with left-sided low back pain.  Earlier this month he had an acute onset of right-sided low back pain as well.  He had a course of prednisone which resolved this.  At this time he feels like most of his pain is back to being focused on the left side.  Has had success with physical therapy before.  He is not currently doing any of the exercises he  was doing before.  He has increased pain with activity.  He has limited lumbar motion as well as limited bilateral lower extremity strength.  He has a long complicated history of abdominal surgeries.  He currently has mesh that causes him pain with activity.  He would benefit from skilled therapy to improve core strength and stability, reduce pain in lower back, and reviewed total program future exacerbations of pain.   OBJECTIVE IMPAIRMENTS: Abnormal gait, decreased activity tolerance, decreased endurance, decreased ROM, decreased strength, improper body mechanics, and postural dysfunction.   ACTIVITY LIMITATIONS: carrying, lifting, bending, standing, squatting, sleeping, stairs, and transfers  PARTICIPATION LIMITATIONS: meal prep, cleaning, laundry, shopping, community activity, occupation, and yard work  PERSONAL FACTORS: 1-2 comorbidities: Multiple abdominal surgeries, history of kidney stones  are also affecting patient's functional outcome.   REHAB POTENTIAL: Good  CLINICAL DECISION MAKING: Evolving/moderate complexity  EVALUATION COMPLEXITY: Moderate   GOALS: Goals reviewed with patient? Yes HORT TERM GOALS: Target date: 08/02/2023     Patient will increase lumbar flexion to full without pain Baseline: Goal status: INITIAL   2.  Patient will increase gross bilateral lower extremity strength by 5 pounds Baseline:  Goal status: INITIAL   3.  Patient will be independent and complaint with basic HEP  Baseline:  Goal status: INITIAL     LONG TERM GOALS: Target  date: 08/30/2023     Patient will return to using the weed wacker without pain  Baseline:  Goal status: INITIAL   2.  Patient will reach overhead to a shelf without pain  Baseline:  Goal status: INITIAL   3.  Patient will show compliance to long term program for strengthening  Baseline:  Goal status: INITIAL  PLAN:  PT FREQUENCY: 2x/week  PT DURATION: 8 weeks  PLANNED INTERVENTIONS: [id number]-Therapeutic exercises, 97530- Therapeutic activity, [id number]- Neuromuscular re-education, 97535- Self Care, [id number]- Manual therapy, [id number]- Gait training, [phone]- Aquatic Therapy, [id number]- Electrical stimulation (unattended), 97035- Ultrasound, Patient/Family education, Stair training, Taping, Dry Needling, DME instructions, Cryotherapy, and Moist heat .  PLAN FOR NEXT SESSION: Consider trigger point dry needling of lumbar spine and gluteals.  Soft tissue mobilization to lumbar spine.  Consider LAD. Continue to expand home exercises and work on getting the patient to buy into home exercises.    Katheran Palms, Student-PT 07/25/2023, 11:11 AM   I have reviewed and concur with this student's documentation.   Kitty Perkins, PT 07/26/2023 8:11 AM  During this treatment session, the therapist was present, participating in and directing the treatment.

## 2023-07-26 ENCOUNTER — Encounter (HOSPITAL_BASED_OUTPATIENT_CLINIC_OR_DEPARTMENT_OTHER): Payer: Self-pay | Admitting: Physical Therapy

## 2023-07-26 DIAGNOSIS — R1013 Epigastric pain: Secondary | ICD-10-CM | POA: Diagnosis not present

## 2023-07-26 DIAGNOSIS — R945 Abnormal results of liver function studies: Secondary | ICD-10-CM | POA: Diagnosis not present

## 2023-07-27 DIAGNOSIS — K8020 Calculus of gallbladder without cholecystitis without obstruction: Secondary | ICD-10-CM | POA: Diagnosis not present

## 2023-08-01 ENCOUNTER — Ambulatory Visit (HOSPITAL_BASED_OUTPATIENT_CLINIC_OR_DEPARTMENT_OTHER): Attending: Family Medicine | Admitting: Physical Therapy

## 2023-08-01 ENCOUNTER — Encounter (HOSPITAL_BASED_OUTPATIENT_CLINIC_OR_DEPARTMENT_OTHER): Payer: Self-pay | Admitting: Physical Therapy

## 2023-08-01 DIAGNOSIS — M25512 Pain in left shoulder: Secondary | ICD-10-CM | POA: Diagnosis not present

## 2023-08-01 DIAGNOSIS — G8929 Other chronic pain: Secondary | ICD-10-CM | POA: Diagnosis not present

## 2023-08-01 DIAGNOSIS — R293 Abnormal posture: Secondary | ICD-10-CM | POA: Diagnosis not present

## 2023-08-01 DIAGNOSIS — M546 Pain in thoracic spine: Secondary | ICD-10-CM | POA: Insufficient documentation

## 2023-08-01 DIAGNOSIS — M5459 Other low back pain: Secondary | ICD-10-CM | POA: Insufficient documentation

## 2023-08-01 NOTE — Therapy (Unsigned)
 OUTPATIENT PHYSICAL THERAPY THORACOLUMBAR EVALUATION   Patient Name: Miguel [name] MRN: [id number] DOB:07-Dec-1950, 73 y.o., male Today's Date: 08/01/2023  END OF SESSION:  PT End of Session - 08/01/23 1109     Visit Number 3    Number of Visits 16    Date for PT Re-Evaluation 08/31/23    PT Start Time 1102    PT Stop Time 1144    PT Time Calculation (min) 42 min    Activity Tolerance Patient tolerated treatment well;No increased pain    Behavior During Therapy WFL for tasks assessed/performed               Past Medical History:  Diagnosis Date   Anemia    Anxiety    BPH (benign prostatic hyperplasia)    Gastritis    GERD (gastroesophageal reflux disease)    "seldom" (07/16/2016)   GI bleed due to NSAIDs 10/27/2015   History of blood transfusion 06/2016   post OR/notes 07/15/2016   History of hiatal hernia    History of kidney stones    surgery to remove stone   Hyperlipidemia    Hypertension    no meds    Melanoma of back (HCC)    "mid back"   Sigmoid diverticulitis    with perforation   Past Surgical History:  Procedure Laterality Date   COLON SURGERY     sigmoid   COLOSTOMY TAKEDOWN N/A 06/28/2016   Procedure: COLOSTOMY TAKEDOWN;  Surgeon: Oza Blumenthal, MD;  Location: [facility] OR;  Service: General;  Laterality: N/A;   CYSTOSCOPY WITH RETROGRADE PYELOGRAM, URETEROSCOPY AND STENT PLACEMENT Bilateral 02/12/2020   Procedure: CYSTOSCOPY WITH BILATERAL RETROGRADE PYELOGRAM,  AND LITHOPEXY;  Surgeon: Sherlyn Ditto, MD;  Location: [facility];  Service: Urology;  Laterality: Bilateral;  1 HR   ESOPHAGOGASTRODUODENOSCOPY N/A 10/27/2015   Procedure: ESOPHAGOGASTRODUODENOSCOPY (EGD);  Surgeon: Ozell Blunt, MD;  Location: [facility] ENDOSCOPY;  Service: Endoscopy;  Laterality: N/A;   ESOPHAGOGASTRODUODENOSCOPY N/A 10/29/2015   Procedure: ESOPHAGOGASTRODUODENOSCOPY (EGD);  Surgeon: Lanita Pitman, MD;  Location: [facility] ENDOSCOPY;  Service: Endoscopy;  Laterality: N/A;    ESOPHAGOGASTRODUODENOSCOPY N/A 10/30/2015   Procedure: ESOPHAGOGASTRODUODENOSCOPY (EGD);  Surgeon: Ozell Blunt, MD;  Location: [facility] ENDOSCOPY;  Service: Endoscopy;  Laterality: N/A;   g tube discontinued  04/2020   per patient   GASTROSTOMY TUBE PLACEMENT  11/21/2015   REDUCTION OF HIATAL HERNIA , REPAIR HIATAL HERNIA, RESECTION SMALL BOWEL WITH ANASTOMOSIS, PLACEMENT GASTROSTOMY TUBE, PLACEMENT DUODENOSTOMY TUBE (N/A)   HEMORRHOID BANDING  X 2   HERNIA REPAIR     HIATAL HERNIA REPAIR N/A 11/21/2015   Procedure: REDUCTION OF HIATAL HERNIA , REPAIR HIATAL HERNIA, RESECTION SMALL BOWEL WITH ANASTOMOSIS, PLACEMENT GASTROSTOMY TUBE, PLACEMENT DUODENOSTOMY TUBE;  Surgeon: Derral Flick, MD;  Location: [facility] OR;  Service: General;  Laterality: N/A;   HOLMIUM LASER APPLICATION Right 02/12/2020   Procedure: HOLMIUM LASER APPLICATION;  Surgeon: Sherlyn Ditto, MD;  Location: [facility];  Service: Urology;  Laterality: Right;   INCISIONAL HERNIA REPAIR  06/28/2016   open/notes 07/15/2016   INCISIONAL HERNIA REPAIR  03/17/2018   WITH MESH   INCISIONAL HERNIA REPAIR N/A 03/17/2018   Procedure: INCISIONAL HERNIA REPAIR WITH MESH;  Surgeon: Oza Blumenthal, MD;  Location: [facility] OR;  Service: General;  Laterality: N/A;   INCISIONAL HERNIA REPAIR N/A 10/16/2020   Procedure: Estrella Hench HERNIA REPAIR WITH MESH;  Surgeon: Oza Blumenthal, MD;  Location: [facility] OR;  Service: General;  Laterality: N/A;   INGUINAL HERNIA REPAIR Bilateral 09/28/2018  INGUINAL HERNIA REPAIR Bilateral 09/28/2018   Procedure: BILATERAL OPEN INGUINAL HERNIA REPAIR WITH MESH;  Surgeon: Oza Blumenthal, MD;  Location: [facility] OR;  Service: General;  Laterality: Bilateral;  GENERAL AND TAP BLOCK   INSERTION OF MESH N/A 03/17/2018   Procedure: INSERTION OF MESH;  Surgeon: Oza Blumenthal, MD;  Location: [facility] OR;  Service: General;  Laterality: N/A;   INSERTION OF MESH Bilateral 09/28/2018   Procedure: Insertion Of Mesh;  Surgeon: Oza Blumenthal,  MD;  Location: [facility] OR;  Service: General;  Laterality: Bilateral;   IR CM INJ ANY COLONIC TUBE W/FLUORO  02/04/2017   IR GUIDED DRAIN W CATHETER PLACEMENT  07/06/2016   /NOTES 07/15/2016   IR PATIENT EVAL TECH 0-60 MINS  06/28/2019   IR RADIOLOGIST EVAL & MGMT  07/27/2016   IR RADIOLOGIST EVAL & MGMT  08/17/2016   IR RADIOLOGIST EVAL & MGMT  08/26/2016   IR REPLACE G-TUBE SIMPLE WO FLUORO  07/28/2017   IR REPLACE G-TUBE SIMPLE WO FLUORO  01/31/2018   IR REPLACE G-TUBE SIMPLE WO FLUORO  10/16/2018   IR REPLACE G-TUBE SIMPLE WO FLUORO  03/05/2019   IR REPLACE G-TUBE SIMPLE WO FLUORO  08/22/2019   IR REPLACE G-TUBE SIMPLE WO FLUORO  01/08/2020   IR REPLC GASTRO/COLONIC TUBE PERCUT W/FLUORO  08/04/2016   IR REPLC GASTRO/COLONIC TUBE PERCUT W/FLUORO  01/14/2017   IR US  GUIDE BX ASP/DRAIN  08/04/2016   KNEE CARTILAGE SURGERY Right 1971   "opened me up"   LAPAROTOMY N/A 07/05/2015   Procedure: PARTIAL SIGMOID COLECTOMY AND COLOSTOMY;  Surgeon: Oza Blumenthal, MD;  Location: [facility] OR;  Service: General;  Laterality: N/A;   MELANOMA EXCISION  2001   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  10/30/2015   Procedure: REMOVAL OF DISTAL STOMACH;  Surgeon: Jerryl Morin, MD;  Location: [facility] OR;  Service: General;;   REPAIR OF PERFORATED ULCER N/A 10/30/2015   Procedure: REPAIR OF BLEEDING  ULCER;  Surgeon: Jerryl Morin, MD;  Location: [facility] OR;  Service: General;  Laterality: N/A;   TUMOR EXCISION  2009   "back; fatty tumor"   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   Bilateral inguinal hernia 09/28/2018   Incisional hernia 03/17/2018   Trigger thumb, left thumb 01/19/2018   Trigger finger, left index finger 07/18/2017   Trigger finger, right ring finger 01/31/2017   Trigger finger of left thumb 01/03/2017   Trigger finger of right thumb 01/03/2017   Trigger index finger of left hand 01/03/2017   Trigger index  finger of right hand 01/03/2017   Malnutrition of moderate degree 07/19/2016   Intra-abdominal abscess (HCC) 07/16/2016   Cellulitis 07/15/2016   S/P colostomy takedown 06/28/2016   Pain in thoracic spine 06/09/2016   Mid back pain 06/09/2016   Postoperative fever 11/19/2015   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   Severe protein-calorie malnutrition (HCC) 11/17/2015   Sepsis (HCC) 11/14/2015   Hiccups 11/14/2015   AKI (acute kidney injury) (HCC) 11/14/2015   Fever    Leg swelling    Left shoulder pain    Muscle spasm of left shoulder    GI bleed 10/27/2015   Acute blood loss anemia 10/27/2015   Syncope 10/27/2015   Hyperglycemia 10/27/2015   Hypotension 10/27/2015   Neck pain 10/27/2015   Hematemesis 10/27/2015   Hematochezia 10/27/2015   Diverticulitis of colon with perforation 07/05/2015    PCP:  Terris Fickle MD   REFERRING PROVIDER: Benedetto Brady MD   REFERRING DIAG:  DiagnosisM54.50 (ICD-10-CM) - Low back pain, unspecified  Rationale for Evaluation and Treatment: Rehabilitation  THERAPY DIAG:  Other low back pain  Pain in thoracic spine  Abnormal posture  Chronic left shoulder pain  ONSET DATE: 1 month prior   SUBJECTIVE:                                                                                                                                                                                           SUBJECTIVE STATEMENT: 5/5 Pain he says today is 5/10. Felt good this morning but had pain after drive over here in his lumbar region.    The patient had an acute onset of right sided low back pain. He was put on prednisone and it improved.Aaron Aas   PERTINENT HISTORY:  Anxiety, anemia, kidney stones,  PAIN:  Are you having pain? Yes: NPRS scale: 5/10 Pain location: left low back  Pain description: aching/constant  Aggravating factors: Standing and walking  Relieving factors: rest   PRECAUTIONS: None  RED FLAGS: None   WEIGHT BEARING RESTRICTIONS:  No  FALLS:  Has patient fallen in last 6 months? No  LIVING ENVIRONMENT: Nothing pertinent   OCCUPATION:  Retired   Presenter, broadcasting:  Yard work   PLOF: Independent  PATIENT GOALS:  To have less pain To get stronger    NEXT MD VISIT:  Nothing scheduled.   OBJECTIVE:  Note: Objective measures were completed at Evaluation unless otherwise noted.  DIAGNOSTIC FINDINGS:  MRI 2022   IMPRESSION: 1. Multilevel spondylosis of the lumbar spine as described. 2. Mild foraminal narrowing bilaterally at L1-2 and L2-3 is worse on the left. Slight retrolisthesis and uncovering of a broad-based disc protrusion contribute at both levels. 3. Mild central and bilateral foraminal narrowing at L3-4 secondary to a broad-based disc protrusion and facet hypertrophy. 4. Mild central and mild to moderate foraminal narrowing bilaterally at L4-5 is worse right than left. 5. Moderate left and mild right foraminal stenosis at L5-S1 due to endplate and facet spurring. 6. Cholelithiasis without evidence for cholecystitis.  PATIENT SURVEYS:  LEFS 49/80  COGNITION: Overall cognitive status: Within functional limits for tasks assessed     SENSATION: WFL    POSTURE: rounded shoulders, forward head, and flexed trunk   PALPATION: Significant spasming in left paraspinals and gluteal   LUMBAR ROM:   AROM eval  Flexion Limited 25% with pain   Extension Painful past neutral   Right lateral flexion   Left lateral flexion   Right rotation   Left rotation    (Blank rows =  not tested)  LOWER EXTREMITY ROM:     LOWER EXTREMITY MMT:    MMT Right eval Left eval  Hip flexion 17.5 16.3  Hip extension    Hip abduction 28.7 26.3  Hip adduction    Hip internal rotation    Hip external rotation    Knee flexion    Knee extension 34.3 32.1  Ankle dorsiflexion    Ankle plantarflexion    Ankle inversion    Ankle eversion     (Blank rows = not tested)    GAIT: Flexed posture   TREATMENT  DATE:  5/5 Manual: All PROM performed with distraction to reduce pain and improve movement Trigger point therapy and STM to lumbar paraspinals and L QL region Thoracic and lumbar PA's grade 2 There-ex: Nustep warm up lvl 3 Bicep curls 3x10 2lbs There-Act  Neuro-Re-ed Cable rows red rtb 3x10 with cueing for posture and muscle activation along with abdominal bracing Cable pullovers red rtb 3x10 with cueing for posture and muscle activation along with abdominal bracing   4/28 Manual: All PROM performed with distraction to reduce pain and improve movement Thoracic and lumbar PA's grade 2 Trigger point therapy bilateral lumbar paraspinals  Neuro-Re-ed  Horizontal ER yellow RTB 3x10 focusing on postural cueing and muscle activation  Banded rows yellow RTB 3x10 focusing on postural cueing and muscle activation  Banded abd yellow RTB 3x10 focusing on postural cueing and muscle activation    Manual: trigger point release to lumbar spine  Review of self trigger point release   Neuro re-ed   Bilateral ER 2x10 with cuing  Horizontal abduction 2x10                                                                                                                                      PATIENT EDUCATION:  Education details: HEP, symptom management  Person educated: Patient Education method: Explanation, Demonstration, Tactile cues, Verbal cues, and Handouts Education comprehension: verbalized understanding, returned demonstration, verbal cues required, tactile cues required, and needs further education  HOME EXERCISE PROGRAM: Access Code: [email or web address] URL: [email or web address] Date: 07/05/2023 Prepared by: Signa Drier  Exercises - Standing Glute Med Mobilization with Small Ball on Wall  - 1 x daily - 7 x weekly - 3 sets - 10 reps - Low Horizontal Abduction with Resistance  - 1 x daily - 7 x weekly - 3 sets - 10 reps -  Shoulder External Rotation and Scapular Retraction with Resistance  - 1 x daily - 7 x weekly - 3 sets - 10 reps  ASSESSMENT:  CLINICAL IMPRESSION: 5/5 Pt warmed up on the nustep for with no increase in symptoms. Manual therapy was performed noting trigger point in left lumbar paraspinals around L3 region. Manual therapy was also performed on thoracic region. Pt felt reduction in symptoms post manual therapy. Neuro re-ed exercises were performed focusing  on technique, muscle activation, and postural cueing. Pt has demonstrated increased tolerance for postural exercises. Cable rows and pulldowns were done with red RTB which is a step up from last visit. Pt will continue to benefit from skilled physical therapy to increase functional strength and ROM for functional ADL's.     Patient is a 73 year old male who presents with left-sided low back pain.  Earlier this month he had an acute onset of right-sided low back pain as well.  He had a course of prednisone which resolved this.  At this time he feels like most of his pain is back to being focused on the left side.  Has had success with physical therapy before.  He is not currently doing any of the exercises he was doing before.  He has increased pain with activity.  He has limited lumbar motion as well as limited bilateral lower extremity strength.  He has a long complicated history of abdominal surgeries.  He currently has mesh that causes him pain with activity.  He would benefit from skilled therapy to improve core strength and stability, reduce pain in lower back, and reviewed total program future exacerbations of pain.   OBJECTIVE IMPAIRMENTS: Abnormal gait, decreased activity tolerance, decreased endurance, decreased ROM, decreased strength, improper body mechanics, and postural dysfunction.   ACTIVITY LIMITATIONS: carrying, lifting, bending, standing, squatting, sleeping, stairs, and transfers  PARTICIPATION LIMITATIONS: meal prep, cleaning,  laundry, shopping, community activity, occupation, and yard work  PERSONAL FACTORS: 1-2 comorbidities: Multiple abdominal surgeries, history of kidney stones  are also affecting patient's functional outcome.   REHAB POTENTIAL: Good  CLINICAL DECISION MAKING: Evolving/moderate complexity  EVALUATION COMPLEXITY: Moderate   GOALS: Goals reviewed with patient? Yes HORT TERM GOALS: Target date: 08/02/2023     Patient will increase lumbar flexion to full without pain Baseline: Goal status: INITIAL   2.  Patient will increase gross bilateral lower extremity strength by 5 pounds Baseline:  Goal status: INITIAL   3.  Patient will be independent and complaint with basic HEP  Baseline:  Goal status: INITIAL     LONG TERM GOALS: Target date: 08/30/2023     Patient will return to using the weed wacker without pain  Baseline:  Goal status: INITIAL   2.  Patient will reach overhead to a shelf without pain  Baseline:  Goal status: INITIAL   3.  Patient will show compliance to long term program for strengthening  Baseline:  Goal status: INITIAL  PLAN:  PT FREQUENCY: 2x/week  PT DURATION: 8 weeks  PLANNED INTERVENTIONS: 97110-Therapeutic exercises, 97530- Therapeutic activity, [id number]- Neuromuscular re-education, 97535- Self Care, 96045- Manual therapy, [id number]- Gait training, [phone]- Aquatic Therapy, [id number]- Electrical stimulation (unattended), 97035- Ultrasound, Patient/Family education, Stair training, Taping, Dry Needling, DME instructions, Cryotherapy, and Moist heat .  PLAN FOR NEXT SESSION: Consider trigger point dry needling of lumbar spine and gluteals.  Soft tissue mobilization to lumbar spine.  Consider LAD. Continue to expand home exercises and work on getting the patient to buy into home exercises.    Katheran Palms, Student-PT 08/01/2023, 12:25 PM   I have reviewed and concur with this student's documentation.    During this treatment session, the therapist was present,  participating in and directing the treatment.

## 2023-08-02 ENCOUNTER — Encounter (HOSPITAL_BASED_OUTPATIENT_CLINIC_OR_DEPARTMENT_OTHER): Payer: Self-pay | Admitting: Physical Therapy

## 2023-08-04 ENCOUNTER — Encounter (HOSPITAL_BASED_OUTPATIENT_CLINIC_OR_DEPARTMENT_OTHER): Admitting: Physical Therapy

## 2023-08-08 ENCOUNTER — Ambulatory Visit (HOSPITAL_BASED_OUTPATIENT_CLINIC_OR_DEPARTMENT_OTHER): Admitting: Physical Therapy

## 2023-08-08 DIAGNOSIS — M5459 Other low back pain: Secondary | ICD-10-CM | POA: Diagnosis not present

## 2023-08-08 DIAGNOSIS — M546 Pain in thoracic spine: Secondary | ICD-10-CM

## 2023-08-08 DIAGNOSIS — G8929 Other chronic pain: Secondary | ICD-10-CM | POA: Diagnosis not present

## 2023-08-08 DIAGNOSIS — M25512 Pain in left shoulder: Secondary | ICD-10-CM | POA: Diagnosis not present

## 2023-08-08 DIAGNOSIS — R293 Abnormal posture: Secondary | ICD-10-CM

## 2023-08-08 NOTE — Therapy (Signed)
 OUTPATIENT PHYSICAL THERAPY THORACOLUMBAR EVALUATION   Patient Name: Miguel [name] MRN: [id number] DOB:[birth year], 73 y.o., male Today's Date: 08/08/2023  END OF SESSION:  PT End of Session - 08/08/23 1350     Visit Number 4    Number of Visits 16    Date for PT Re-Evaluation 08/31/23    PT Start Time 1103    PT Stop Time 1146    PT Time Calculation (min) 43 min    Activity Tolerance Patient tolerated treatment well;No increased pain    Behavior During Therapy WFL for tasks assessed/performed                Past Medical History:  Diagnosis Date   Anemia    Anxiety    BPH (benign prostatic hyperplasia)    Gastritis    GERD (gastroesophageal reflux disease)    "seldom" (07/16/2016)   GI bleed due to NSAIDs 10/27/2015   History of blood transfusion 06/2016   post OR/notes 07/15/2016   History of hiatal hernia    History of kidney stones    surgery to remove stone   Hyperlipidemia    Hypertension    no meds    Melanoma of back (HCC)    "mid back"   Sigmoid diverticulitis    with perforation   Past Surgical History:  Procedure Laterality Date   COLON SURGERY     sigmoid   COLOSTOMY TAKEDOWN N/A 06/28/2016   Procedure: COLOSTOMY TAKEDOWN;  Surgeon: Oza Blumenthal, MD;  Location: [facility] OR;  Service: General;  Laterality: N/A;   CYSTOSCOPY WITH RETROGRADE PYELOGRAM, URETEROSCOPY AND STENT PLACEMENT Bilateral 02/12/2020   Procedure: CYSTOSCOPY WITH BILATERAL RETROGRADE PYELOGRAM,  AND LITHOPEXY;  Surgeon: Sherlyn Ditto, MD;  Location: [facility];  Service: Urology;  Laterality: Bilateral;  1 HR   ESOPHAGOGASTRODUODENOSCOPY N/A 10/27/2015   Procedure: ESOPHAGOGASTRODUODENOSCOPY (EGD);  Surgeon: Ozell Blunt, MD;  Location: [facility] ENDOSCOPY;  Service: Endoscopy;  Laterality: N/A;   ESOPHAGOGASTRODUODENOSCOPY N/A 10/29/2015   Procedure: ESOPHAGOGASTRODUODENOSCOPY (EGD);  Surgeon: Lanita Pitman, MD;  Location: [facility] ENDOSCOPY;  Service: Endoscopy;  Laterality: N/A;    ESOPHAGOGASTRODUODENOSCOPY N/A 10/30/2015   Procedure: ESOPHAGOGASTRODUODENOSCOPY (EGD);  Surgeon: Ozell Blunt, MD;  Location: [facility] ENDOSCOPY;  Service: Endoscopy;  Laterality: N/A;   g tube discontinued  04/2020   per patient   GASTROSTOMY TUBE PLACEMENT  11/21/2015   REDUCTION OF HIATAL HERNIA , REPAIR HIATAL HERNIA, RESECTION SMALL BOWEL WITH ANASTOMOSIS, PLACEMENT GASTROSTOMY TUBE, PLACEMENT DUODENOSTOMY TUBE (N/A)   HEMORRHOID BANDING  X 2   HERNIA REPAIR     HIATAL HERNIA REPAIR N/A 11/21/2015   Procedure: REDUCTION OF HIATAL HERNIA , REPAIR HIATAL HERNIA, RESECTION SMALL BOWEL WITH ANASTOMOSIS, PLACEMENT GASTROSTOMY TUBE, PLACEMENT DUODENOSTOMY TUBE;  Surgeon: Derral Flick, MD;  Location: [facility] OR;  Service: General;  Laterality: N/A;   HOLMIUM LASER APPLICATION Right 02/12/2020   Procedure: HOLMIUM LASER APPLICATION;  Surgeon: Sherlyn Ditto, MD;  Location: [facility];  Service: Urology;  Laterality: Right;   INCISIONAL HERNIA REPAIR  06/28/2016   open/notes 07/15/2016   INCISIONAL HERNIA REPAIR  03/17/2018   WITH MESH   INCISIONAL HERNIA REPAIR N/A 03/17/2018   Procedure: INCISIONAL HERNIA REPAIR WITH MESH;  Surgeon: Oza Blumenthal, MD;  Location: [facility] OR;  Service: General;  Laterality: N/A;   INCISIONAL HERNIA REPAIR N/A 10/16/2020   Procedure: Estrella Hench HERNIA REPAIR WITH MESH;  Surgeon: Oza Blumenthal, MD;  Location: [facility] OR;  Service: General;  Laterality: N/A;   INGUINAL HERNIA REPAIR Bilateral  09/28/2018   INGUINAL HERNIA REPAIR Bilateral 09/28/2018   Procedure: BILATERAL OPEN INGUINAL HERNIA REPAIR WITH MESH;  Surgeon: Oza Blumenthal, MD;  Location: [facility] OR;  Service: General;  Laterality: Bilateral;  GENERAL AND TAP BLOCK   INSERTION OF MESH N/A 03/17/2018   Procedure: INSERTION OF MESH;  Surgeon: Oza Blumenthal, MD;  Location: [facility] OR;  Service: General;  Laterality: N/A;   INSERTION OF MESH Bilateral 09/28/2018   Procedure: Insertion Of Mesh;  Surgeon: Oza Blumenthal,  MD;  Location: [facility] OR;  Service: General;  Laterality: Bilateral;   IR CM INJ ANY COLONIC TUBE W/FLUORO  02/04/2017   IR GUIDED DRAIN W CATHETER PLACEMENT  07/06/2016   /NOTES 07/15/2016   IR PATIENT EVAL TECH 0-60 MINS  06/28/2019   IR RADIOLOGIST EVAL & MGMT  07/27/2016   IR RADIOLOGIST EVAL & MGMT  08/17/2016   IR RADIOLOGIST EVAL & MGMT  08/26/2016   IR REPLACE G-TUBE SIMPLE WO FLUORO  07/28/2017   IR REPLACE G-TUBE SIMPLE WO FLUORO  01/31/2018   IR REPLACE G-TUBE SIMPLE WO FLUORO  10/16/2018   IR REPLACE G-TUBE SIMPLE WO FLUORO  03/05/2019   IR REPLACE G-TUBE SIMPLE WO FLUORO  08/22/2019   IR REPLACE G-TUBE SIMPLE WO FLUORO  01/08/2020   IR REPLC GASTRO/COLONIC TUBE PERCUT W/FLUORO  08/04/2016   IR REPLC GASTRO/COLONIC TUBE PERCUT W/FLUORO  01/14/2017   IR US  GUIDE BX ASP/DRAIN  08/04/2016   KNEE CARTILAGE SURGERY Right 1971   "opened me up"   LAPAROTOMY N/A 07/05/2015   Procedure: PARTIAL SIGMOID COLECTOMY AND COLOSTOMY;  Surgeon: Oza Blumenthal, MD;  Location: [facility] OR;  Service: General;  Laterality: N/A;   MELANOMA EXCISION  2001   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  10/30/2015   Procedure: REMOVAL OF DISTAL STOMACH;  Surgeon: Jerryl Morin, MD;  Location: [facility] OR;  Service: General;;   REPAIR OF PERFORATED ULCER N/A 10/30/2015   Procedure: REPAIR OF BLEEDING  ULCER;  Surgeon: Jerryl Morin, MD;  Location: [facility] OR;  Service: General;  Laterality: N/A;   TUMOR EXCISION  2009   "back; fatty tumor"   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   Bilateral inguinal hernia 09/28/2018   Incisional hernia 03/17/2018   Trigger thumb, left thumb 01/19/2018   Trigger finger, left index finger 07/18/2017   Trigger finger, right ring finger 01/31/2017   Trigger finger of left thumb 01/03/2017   Trigger finger of right thumb 01/03/2017   Trigger index finger of left hand 01/03/2017   Trigger index  finger of right hand 01/03/2017   Malnutrition of moderate degree 07/19/2016   Intra-abdominal abscess (HCC) 07/16/2016   Cellulitis 07/15/2016   S/P colostomy takedown 06/28/2016   Pain in thoracic spine 06/09/2016   Mid back pain 06/09/2016   Postoperative fever 11/19/2015   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   Severe protein-calorie malnutrition (HCC) 11/17/2015   Sepsis (HCC) 11/14/2015   Hiccups 11/14/2015   AKI (acute kidney injury) (HCC) 11/14/2015   Fever    Leg swelling    Left shoulder pain    Muscle spasm of left shoulder    GI bleed 10/27/2015   Acute blood loss anemia 10/27/2015   Syncope 10/27/2015   Hyperglycemia 10/27/2015   Hypotension 10/27/2015   Neck pain 10/27/2015   Hematemesis 10/27/2015   Hematochezia 10/27/2015   Diverticulitis of colon with perforation 07/05/2015  PCP: Terris Fickle MD   REFERRING PROVIDER: Benedetto Brady MD   REFERRING DIAG:  DiagnosisM54.50 (ICD-10-CM) - Low back pain, unspecified  Rationale for Evaluation and Treatment: Rehabilitation  THERAPY DIAG:  Other low back pain  Pain in thoracic spine  Abnormal posture  Chronic left shoulder pain  ONSET DATE: 1 month prior   SUBJECTIVE:                                                                                                                                                                                           SUBJECTIVE STATEMENT: The patient reports he woke up this morning hurting. He did a lot over the weekend. He started having abdominal pain last night. He is unsure if it was his back or his abdomen.    PERTINENT HISTORY:  Anxiety, anemia, kidney stones,  PAIN:  Are you having pain? Yes: NPRS scale: 5/10 Pain location: left low back  Pain description: aching/constant  Aggravating factors: Standing and walking  Relieving factors: rest   PRECAUTIONS: None  RED FLAGS: None   WEIGHT BEARING RESTRICTIONS: No  FALLS:  Has patient fallen in last 6  months? No  LIVING ENVIRONMENT: Nothing pertinent   OCCUPATION:  Retired   Presenter, broadcasting:  Yard work   PLOF: Independent  PATIENT GOALS:  To have less pain To get stronger    NEXT MD VISIT:  Nothing scheduled.   OBJECTIVE:  Note: Objective measures were completed at Evaluation unless otherwise noted.  DIAGNOSTIC FINDINGS:  MRI 2022   IMPRESSION: 1. Multilevel spondylosis of the lumbar spine as described. 2. Mild foraminal narrowing bilaterally at L1-2 and L2-3 is worse on the left. Slight retrolisthesis and uncovering of a broad-based disc protrusion contribute at both levels. 3. Mild central and bilateral foraminal narrowing at L3-4 secondary to a broad-based disc protrusion and facet hypertrophy. 4. Mild central and mild to moderate foraminal narrowing bilaterally at L4-5 is worse right than left. 5. Moderate left and mild right foraminal stenosis at L5-S1 due to endplate and facet spurring. 6. Cholelithiasis without evidence for cholecystitis.  PATIENT SURVEYS:  LEFS 49/80  COGNITION: Overall cognitive status: Within functional limits for tasks assessed     SENSATION: WFL    POSTURE: rounded shoulders, forward head, and flexed trunk   PALPATION: Significant spasming in left paraspinals and gluteal   LUMBAR ROM:   AROM eval  Flexion Limited 25% with pain   Extension Painful past neutral   Right lateral flexion   Left lateral flexion   Right rotation   Left rotation    (Blank rows = not tested)  LOWER EXTREMITY ROM:  LOWER EXTREMITY MMT:    MMT Right eval Left eval  Hip flexion 17.5 16.3  Hip extension    Hip abduction 28.7 26.3  Hip adduction    Hip internal rotation    Hip external rotation    Knee flexion    Knee extension 34.3 32.1  Ankle dorsiflexion    Ankle plantarflexion    Ankle inversion    Ankle eversion     (Blank rows = not tested)    GAIT: Flexed posture   TREATMENT DATE:    5/12 Manual:  Trigger point  therapy and STM to lumbar paraspinals and L QL region Thoracic and lumbar PA's grade 2 Skilled palpation of trigger points   Trigger Point Dry Needling  Initial Treatment: Pt instructed on Dry Needling rational, procedures, and possible side effects. Pt instructed to expect mild to moderate muscle soreness later in the day and/or into the next day.  Pt instructed in methods to reduce muscle soreness. Pt instructed to continue prescribed HEP. Because Dry Needling was performed over or adjacent to a lung field, pt was educated on S/S of pneumothorax and to seek immediate medical attention should they occur.  Patient was educated on signs and symptoms of infection and other risk factors and advised to seek medical attention should they occur.  Patient verbalized understanding of these instructions and education.   Patient Verbal Consent Given: Yes Education Handout Provided: Yes Muscles Treated: .30x5o needle 2 spots in t-10-t-12 and 2 spots in L1-L3  Electrical Stimulation Performed: No Treatment Response/Outcome: great twitch   Neuro-re-ed:  Bilateral er 3x10 red with cuing for posture and breathing  Horizontal abduction 3x10  Seated hip abdcution 3x10 red  There-ex:  Flexion stretch with wand 3x10   5/5 Manual:  Trigger point therapy and STM to lumbar paraspinals and L QL region Thoracic and lumbar PA's grade 2 There-ex: Nustep warm up lvl 3 Bicep curls 3x10 2lbs There-Act  Neuro-Re-ed Cable rows red rtb 3x10 with cueing for posture and muscle activation along with abdominal bracing Cable pullovers red rtb 3x10 with cueing for posture and muscle activation along with abdominal bracing   4/28 Manual: All PROM performed with distraction to reduce pain and improve movement Thoracic and lumbar PA's grade 2 Trigger point therapy bilateral lumbar paraspinals  Neuro-Re-ed  Horizontal ER yellow RTB 3x10 focusing on postural cueing and  muscle activation  Banded rows yellow RTB 3x10 focusing on postural cueing and muscle activation  Banded abd yellow RTB 3x10 focusing on postural cueing and muscle activation    Manual: trigger point release to lumbar spine  Review of self trigger point release   Neuro re-ed   Bilateral ER 2x10 with cuing  Horizontal abduction 2x10  PATIENT EDUCATION:  Education details: HEP, symptom management  Person educated: Patient Education method: Explanation, Demonstration, Tactile cues, Verbal cues, and Handouts Education comprehension: verbalized understanding, returned demonstration, verbal cues required, tactile cues required, and needs further education  HOME EXERCISE PROGRAM: Access Code: [email or web address] URL: [email or web address] Date: 07/05/2023 Prepared by: Signa Drier  Exercises - Standing Glute Med Mobilization with Small Ball on Wall  - 1 x daily - 7 x weekly - 3 sets - 10 reps - Low Horizontal Abduction with Resistance  - 1 x daily - 7 x weekly - 3 sets - 10 reps - Shoulder External Rotation and Scapular Retraction with Resistance  - 1 x daily - 7 x weekly - 3 sets - 10 reps  ASSESSMENT:  CLINICAL IMPRESSION: The patient had trigger points lon the left side of his lumbar and thoracic spine. Therapy performed needling to that area. He tolerated well. He reported improved pain and ability to stand following manual therapy and needling. We reviewed a cane stretch to stretch his anterior core muscles. He reported it felt good. We gave him and updated HEP and reviewed a few of his core exercise for home.    Patient is a 73 year old male who presents with left-sided low back pain.  Earlier this month he had an acute onset of right-sided low back pain as well.  He had a course of prednisone which resolved this.  At this time he feels like  most of his pain is back to being focused on the left side.  Has had success with physical therapy before.  He is not currently doing any of the exercises he was doing before.  He has increased pain with activity.  He has limited lumbar motion as well as limited bilateral lower extremity strength.  He has a long complicated history of abdominal surgeries.  He currently has mesh that causes him pain with activity.  He would benefit from skilled therapy to improve core strength and stability, reduce pain in lower back, and reviewed total program future exacerbations of pain.   OBJECTIVE IMPAIRMENTS: Abnormal gait, decreased activity tolerance, decreased endurance, decreased ROM, decreased strength, improper body mechanics, and postural dysfunction.   ACTIVITY LIMITATIONS: carrying, lifting, bending, standing, squatting, sleeping, stairs, and transfers  PARTICIPATION LIMITATIONS: meal prep, cleaning, laundry, shopping, community activity, occupation, and yard work  PERSONAL FACTORS: 1-2 comorbidities: Multiple abdominal surgeries, history of kidney stones are also affecting patient's functional outcome.   REHAB POTENTIAL: Good  CLINICAL DECISION MAKING: Evolving/moderate complexity  EVALUATION COMPLEXITY: Moderate   GOALS: Goals reviewed with patient? Yes SHORT TERM GOALS: Target date: 08/02/2023     Patient will increase lumbar flexion to full without pain Baseline: Goal status: INITIAL   2.  Patient will increase gross bilateral lower extremity strength by 5 pounds Baseline:  Goal status: INITIAL   3.  Patient will be independent and complaint with basic HEP  Baseline:  Goal status: INITIAL     LONG TERM GOALS: Target date: 08/30/2023     Patient will return to using the weed wacker without pain  Baseline:  Goal status: INITIAL   2.  Patient will reach overhead to a shelf without pain  Baseline:  Goal status: INITIAL   3.  Patient will show compliance to long term program  for strengthening  Baseline:  Goal status: INITIAL  PLAN:  PT FREQUENCY: 2x/week  PT DURATION: 8 weeks  PLANNED INTERVENTIONS: 97110-Therapeutic exercises, 97530- Therapeutic activity, 97112- Neuromuscular re-education, 97535- Self Care, 82956- Manual therapy,  82956- Gait training, [id number]- Aquatic Therapy, [id number]- Electrical stimulation (unattended), [id number]- Ultrasound, Patient/Family education, Stair training, Taping, Dry Needling, DME instructions, Cryotherapy, and Moist heat .  PLAN FOR NEXT SESSION: Consider trigger point dry needling of lumbar spine and gluteals.  Soft tissue mobilization to lumbar spine.  Consider LAD. Continue to expand home exercises and work on getting the patient to buy into home exercises.    Kitty Perkins, PT 08/08/2023, 1:54 PM   I have reviewed and concur with this student's documentation.    During this treatment session, the therapist was present, participating in and directing the treatment.

## 2023-08-09 DIAGNOSIS — E7800 Pure hypercholesterolemia, unspecified: Secondary | ICD-10-CM | POA: Diagnosis not present

## 2023-08-09 DIAGNOSIS — R945 Abnormal results of liver function studies: Secondary | ICD-10-CM | POA: Diagnosis not present

## 2023-08-10 DIAGNOSIS — R972 Elevated prostate specific antigen [PSA]: Secondary | ICD-10-CM | POA: Diagnosis not present

## 2023-08-10 DIAGNOSIS — I10 Essential (primary) hypertension: Secondary | ICD-10-CM | POA: Diagnosis not present

## 2023-08-10 DIAGNOSIS — D509 Iron deficiency anemia, unspecified: Secondary | ICD-10-CM | POA: Diagnosis not present

## 2023-08-10 DIAGNOSIS — G4700 Insomnia, unspecified: Secondary | ICD-10-CM | POA: Diagnosis not present

## 2023-08-10 DIAGNOSIS — R1032 Left lower quadrant pain: Secondary | ICD-10-CM | POA: Diagnosis not present

## 2023-08-10 DIAGNOSIS — F419 Anxiety disorder, unspecified: Secondary | ICD-10-CM | POA: Diagnosis not present

## 2023-08-10 DIAGNOSIS — M545 Low back pain, unspecified: Secondary | ICD-10-CM | POA: Diagnosis not present

## 2023-08-11 ENCOUNTER — Ambulatory Visit (HOSPITAL_BASED_OUTPATIENT_CLINIC_OR_DEPARTMENT_OTHER): Admitting: Physical Therapy

## 2023-08-11 DIAGNOSIS — R293 Abnormal posture: Secondary | ICD-10-CM | POA: Diagnosis not present

## 2023-08-11 DIAGNOSIS — G8929 Other chronic pain: Secondary | ICD-10-CM | POA: Diagnosis not present

## 2023-08-11 DIAGNOSIS — M546 Pain in thoracic spine: Secondary | ICD-10-CM | POA: Diagnosis not present

## 2023-08-11 DIAGNOSIS — M5459 Other low back pain: Secondary | ICD-10-CM

## 2023-08-11 DIAGNOSIS — M25512 Pain in left shoulder: Secondary | ICD-10-CM | POA: Diagnosis not present

## 2023-08-11 NOTE — Therapy (Addendum)
 OUTPATIENT PHYSICAL THERAPY THORACOLUMBAR EVALUATION   Patient Name: Miguel [name] MRN: [id number] DOB:09-19-1950, 73 y.o., male Today's Date: 08/11/2023  END OF SESSION:  PT End of Session - 08/11/23 1030     Visit Number 5    Number of Visits 16    Date for PT Re-Evaluation 08/31/23    PT Start Time 0933    PT Stop Time 1017    PT Time Calculation (min) 44 min    Activity Tolerance Patient tolerated treatment well;No increased pain    Behavior During Therapy WFL for tasks assessed/performed                 Past Medical History:  Diagnosis Date   Anemia    Anxiety    BPH (benign prostatic hyperplasia)    Gastritis    GERD (gastroesophageal reflux disease)    seldom (07/16/2016)   GI bleed due to NSAIDs 10/27/2015   History of blood transfusion 06/2016   post OR/notes 07/15/2016   History of hiatal hernia    History of kidney stones    surgery to remove stone   Hyperlipidemia    Hypertension    no meds    Melanoma of back (HCC)    mid back   Sigmoid diverticulitis    with perforation   Past Surgical History:  Procedure Laterality Date   COLON SURGERY     sigmoid   COLOSTOMY TAKEDOWN N/A 06/28/2016   Procedure: COLOSTOMY TAKEDOWN;  Surgeon: Oza Blumenthal, MD;  Location: [facility] OR;  Service: General;  Laterality: N/A;   CYSTOSCOPY WITH RETROGRADE PYELOGRAM, URETEROSCOPY AND STENT PLACEMENT Bilateral 02/12/2020   Procedure: CYSTOSCOPY WITH BILATERAL RETROGRADE PYELOGRAM,  AND LITHOPEXY;  Surgeon: Sherlyn Ditto, MD;  Location: [facility];  Service: Urology;  Laterality: Bilateral;  1 HR   ESOPHAGOGASTRODUODENOSCOPY N/A 10/27/2015   Procedure: ESOPHAGOGASTRODUODENOSCOPY (EGD);  Surgeon: Ozell Blunt, MD;  Location: [facility] ENDOSCOPY;  Service: Endoscopy;  Laterality: N/A;   ESOPHAGOGASTRODUODENOSCOPY N/A 10/29/2015   Procedure: ESOPHAGOGASTRODUODENOSCOPY (EGD);  Surgeon: Lanita Pitman, MD;  Location: [facility] ENDOSCOPY;  Service: Endoscopy;  Laterality: N/A;    ESOPHAGOGASTRODUODENOSCOPY N/A 10/30/2015   Procedure: ESOPHAGOGASTRODUODENOSCOPY (EGD);  Surgeon: Ozell Blunt, MD;  Location: [facility] ENDOSCOPY;  Service: Endoscopy;  Laterality: N/A;   g tube discontinued  04/2020   per patient   GASTROSTOMY TUBE PLACEMENT  11/21/2015   REDUCTION OF HIATAL HERNIA , REPAIR HIATAL HERNIA, RESECTION SMALL BOWEL WITH ANASTOMOSIS, PLACEMENT GASTROSTOMY TUBE, PLACEMENT DUODENOSTOMY TUBE (N/A)   HEMORRHOID BANDING  X 2   HERNIA REPAIR     HIATAL HERNIA REPAIR N/A 11/21/2015   Procedure: REDUCTION OF HIATAL HERNIA , REPAIR HIATAL HERNIA, RESECTION SMALL BOWEL WITH ANASTOMOSIS, PLACEMENT GASTROSTOMY TUBE, PLACEMENT DUODENOSTOMY TUBE;  Surgeon: Derral Flick, MD;  Location: [facility] OR;  Service: General;  Laterality: N/A;   HOLMIUM LASER APPLICATION Right 02/12/2020   Procedure: HOLMIUM LASER APPLICATION;  Surgeon: Sherlyn Ditto, MD;  Location: [facility];  Service: Urology;  Laterality: Right;   INCISIONAL HERNIA REPAIR  06/28/2016   open/notes 07/15/2016   INCISIONAL HERNIA REPAIR  03/17/2018   WITH MESH   INCISIONAL HERNIA REPAIR N/A 03/17/2018   Procedure: INCISIONAL HERNIA REPAIR WITH MESH;  Surgeon: Oza Blumenthal, MD;  Location: [facility] OR;  Service: General;  Laterality: N/A;   INCISIONAL HERNIA REPAIR N/A 10/16/2020   Procedure: Estrella Hench HERNIA REPAIR WITH MESH;  Surgeon: Oza Blumenthal, MD;  Location: [facility] OR;  Service: General;  Laterality: N/A;   INGUINAL HERNIA REPAIR  Bilateral 09/28/2018   INGUINAL HERNIA REPAIR Bilateral 09/28/2018   Procedure: BILATERAL OPEN INGUINAL HERNIA REPAIR WITH MESH;  Surgeon: Oza Blumenthal, MD;  Location: [facility] OR;  Service: General;  Laterality: Bilateral;  GENERAL AND TAP BLOCK   INSERTION OF MESH N/A 03/17/2018   Procedure: INSERTION OF MESH;  Surgeon: Oza Blumenthal, MD;  Location: [facility] OR;  Service: General;  Laterality: N/A;   INSERTION OF MESH Bilateral 09/28/2018   Procedure: Insertion Of Mesh;  Surgeon: Oza Blumenthal,  MD;  Location: [facility] OR;  Service: General;  Laterality: Bilateral;   IR CM INJ ANY COLONIC TUBE W/FLUORO  02/04/2017   IR GUIDED DRAIN W CATHETER PLACEMENT  07/06/2016   /NOTES 07/15/2016   IR PATIENT EVAL TECH 0-60 MINS  06/28/2019   IR RADIOLOGIST EVAL & MGMT  07/27/2016   IR RADIOLOGIST EVAL & MGMT  08/17/2016   IR RADIOLOGIST EVAL & MGMT  08/26/2016   IR REPLACE G-TUBE SIMPLE WO FLUORO  07/28/2017   IR REPLACE G-TUBE SIMPLE WO FLUORO  01/31/2018   IR REPLACE G-TUBE SIMPLE WO FLUORO  10/16/2018   IR REPLACE G-TUBE SIMPLE WO FLUORO  03/05/2019   IR REPLACE G-TUBE SIMPLE WO FLUORO  08/22/2019   IR REPLACE G-TUBE SIMPLE WO FLUORO  01/08/2020   IR REPLC GASTRO/COLONIC TUBE PERCUT W/FLUORO  08/04/2016   IR REPLC GASTRO/COLONIC TUBE PERCUT W/FLUORO  01/14/2017   IR US  GUIDE BX ASP/DRAIN  08/04/2016   KNEE CARTILAGE SURGERY Right 1971   opened me up   LAPAROTOMY N/A 07/05/2015   Procedure: PARTIAL SIGMOID COLECTOMY AND COLOSTOMY;  Surgeon: Oza Blumenthal, MD;  Location: [facility] OR;  Service: General;  Laterality: N/A;   MELANOMA EXCISION  2001   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  10/30/2015   Procedure: REMOVAL OF DISTAL STOMACH;  Surgeon: Jerryl Morin, MD;  Location: [facility] OR;  Service: General;;   REPAIR OF PERFORATED ULCER N/A 10/30/2015   Procedure: REPAIR OF BLEEDING  ULCER;  Surgeon: Jerryl Morin, MD;  Location: [facility] OR;  Service: General;  Laterality: N/A;   TUMOR EXCISION  2009   back; fatty tumor   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   Bilateral inguinal hernia 09/28/2018   Incisional hernia 03/17/2018   Trigger thumb, left thumb 01/19/2018   Trigger finger, left index finger 07/18/2017   Trigger finger, right ring finger 01/31/2017   Trigger finger of left thumb 01/03/2017   Trigger finger of right thumb 01/03/2017   Trigger index finger of left hand 01/03/2017   Trigger index  finger of right hand 01/03/2017   Malnutrition of moderate degree 07/19/2016   Intra-abdominal abscess (HCC) 07/16/2016   Cellulitis 07/15/2016   S/P colostomy takedown 06/28/2016   Pain in thoracic spine 06/09/2016   Mid back pain 06/09/2016   Postoperative fever 11/19/2015   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   Severe protein-calorie malnutrition (HCC) 11/17/2015   Sepsis (HCC) 11/14/2015   Hiccups 11/14/2015   AKI (acute kidney injury) (HCC) 11/14/2015   Fever    Leg swelling    Left shoulder pain    Muscle spasm of left shoulder    GI bleed 10/27/2015   Acute blood loss anemia 10/27/2015   Syncope 10/27/2015   Hyperglycemia 10/27/2015   Hypotension 10/27/2015   Neck pain 10/27/2015   Hematemesis 10/27/2015   Hematochezia 10/27/2015   Diverticulitis of colon with perforation 07/05/2015  PCP: Terris Fickle MD   REFERRING PROVIDER: Benedetto Brady MD   REFERRING DIAG:  DiagnosisM54.50 (ICD-10-CM) - Low back pain, unspecified  Rationale for Evaluation and Treatment: Rehabilitation  THERAPY DIAG:  Other low back pain  Pain in thoracic spine  Abnormal posture  ONSET DATE: 1 month prior   SUBJECTIVE:                                                                                                                                                                                           SUBJECTIVE STATEMENT: The patient reports a significant improvement from last visit. He reports he is still a little sore in his left thoracic spine. He has been to his GI doc who is happy with the progress in his stomach area  PERTINENT HISTORY:  Anxiety, anemia, kidney stones,  PAIN:  Are you having pain? Yes: NPRS scale: 5/10 Pain location: left low back  Pain description: aching/constant  Aggravating factors: Standing and walking  Relieving factors: rest   PRECAUTIONS: None  RED FLAGS: None   WEIGHT BEARING RESTRICTIONS: No  FALLS:  Has patient fallen in last 6 months?  No  LIVING ENVIRONMENT: Nothing pertinent   OCCUPATION:  Retired   Presenter, broadcasting:  Yard work   PLOF: Independent  PATIENT GOALS:  To have less pain To get stronger    NEXT MD VISIT:  Nothing scheduled.   OBJECTIVE:  Note: Objective measures were completed at Evaluation unless otherwise noted.  DIAGNOSTIC FINDINGS:  MRI 2022   IMPRESSION: 1. Multilevel spondylosis of the lumbar spine as described. 2. Mild foraminal narrowing bilaterally at L1-2 and L2-3 is worse on the left. Slight retrolisthesis and uncovering of a broad-based disc protrusion contribute at both levels. 3. Mild central and bilateral foraminal narrowing at L3-4 secondary to a broad-based disc protrusion and facet hypertrophy. 4. Mild central and mild to moderate foraminal narrowing bilaterally at L4-5 is worse right than left. 5. Moderate left and mild right foraminal stenosis at L5-S1 due to endplate and facet spurring. 6. Cholelithiasis without evidence for cholecystitis.  PATIENT SURVEYS:  LEFS 49/80  COGNITION: Overall cognitive status: Within functional limits for tasks assessed     SENSATION: WFL    POSTURE: rounded shoulders, forward head, and flexed trunk   PALPATION: Significant spasming in left paraspinals and gluteal   LUMBAR ROM:   AROM eval  Flexion Limited 25% with pain   Extension Painful past neutral   Right lateral flexion   Left lateral flexion   Right rotation   Left rotation    (Blank rows = not tested)  LOWER EXTREMITY ROM:  LOWER EXTREMITY MMT:    MMT Right eval Left eval  Hip flexion 17.5 16.3  Hip extension    Hip abduction 28.7 26.3  Hip adduction    Hip internal rotation    Hip external rotation    Knee flexion    Knee extension 34.3 32.1  Ankle dorsiflexion    Ankle plantarflexion    Ankle inversion    Ankle eversion     (Blank rows = not tested)    GAIT: Flexed posture   TREATMENT DATE:  5/15 Initial Treatment: Pt instructed on Dry  Needling rational, procedures, and possible side effects. Pt instructed to expect mild to moderate muscle soreness later in the day and/or into the next day.  Pt instructed in methods to reduce muscle soreness. Pt instructed to continue prescribed HEP. Because Dry Needling was performed over or adjacent to a lung field, pt was educated on S/S of pneumothorax and to seek immediate medical attention should they occur.  Patient was educated on signs and symptoms of infection and other risk factors and advised to seek medical attention should they occur.  Patient verbalized understanding of these instructions and education.   Patient Verbal Consent Given: Yes Education Handout Provided: Yes Muscles Treated: .30x5o needle 2 spots in t-10-t-12 and 2 spots in L1-L3  Electrical Stimulation Performed: No Treatment Response/Outcome: great twitch   There-ex:  Anterior flexion stretching 2x10  LTR 2x10   Nuero-Re-ed  Biceps curl with posture 3lbs 3x12  Punches 3 lbs 2x10 with cuing for posture  Overhead press 3x12 2 lbs with cuing for posture      5/12 Manual:  Trigger point therapy and STM to lumbar paraspinals and L QL region Thoracic and lumbar PA's grade 2 Skilled palpation of trigger points   Trigger Point Dry Needling  Initial Treatment: Pt instructed on Dry Needling rational, procedures, and possible side effects. Pt instructed to expect mild to moderate muscle soreness later in the day and/or into the next day.  Pt instructed in methods to reduce muscle soreness. Pt instructed to continue prescribed HEP. Because Dry Needling was performed over or adjacent to a lung field, pt was educated on S/S of pneumothorax and to seek immediate medical attention should they occur.  Patient was educated on signs and symptoms of infection and other risk factors and advised to seek medical attention should they occur.  Patient verbalized understanding of these instructions and education.    Patient Verbal Consent Given: Yes Education Handout Provided: Yes Muscles Treated: .30x5o needle 2 spots in t-10-t-12 and 2 spots in L1-L3  Electrical Stimulation Performed: No Treatment Response/Outcome: great twitch   Neuro-re-ed:  Bilateral er 3x10 red with cuing for posture and breathing  Horizontal abduction 3x10  Seated hip abdcution 3x10 red  There-ex:  Flexion stretch with wand 3x10   5/5 Manual:  Trigger point therapy and STM to lumbar paraspinals and L QL region Thoracic and lumbar PA's grade 2 There-ex: Nustep warm up lvl 3 Bicep curls 3x10 2lbs There-Act  Neuro-Re-ed Cable rows red rtb 3x10 with cueing for posture and muscle activation along with abdominal bracing Cable pullovers red rtb 3x10 with cueing for posture and muscle activation along with abdominal bracing   4/28 Manual: All PROM performed with distraction to reduce pain and improve movement Thoracic and lumbar PA's grade 2 Trigger point therapy bilateral lumbar paraspinals  Neuro-Re-ed  Horizontal ER yellow RTB 3x10 focusing on postural cueing and muscle activation  Banded rows yellow RTB 3x10 focusing on postural cueing and muscle activation  Banded abd yellow RTB 3x10 focusing on postural cueing and muscle activation    Manual: trigger point release to lumbar spine  Review of self trigger point release   Neuro re-ed   Bilateral ER 2x10 with cuing  Horizontal abduction 2x10                                                                                                                                      PATIENT EDUCATION:  Education details: HEP, symptom management  Person educated: Patient Education method: Explanation, Demonstration, Tactile cues, Verbal cues, and Handouts Education comprehension: verbalized understanding, returned demonstration, verbal cues required, tactile cues required, and needs further education  HOME EXERCISE  PROGRAM: Access Code: [email or web address] URL: [email or web address] Date: 07/05/2023 Prepared by: Signa Drier  Exercises - Standing Glute Med Mobilization with Small Ball on Wall  - 1 x daily - 7 x weekly - 3 sets - 10 reps - Low Horizontal Abduction with Resistance  - 1 x daily - 7 x weekly - 3 sets - 10 reps - Shoulder External Rotation and Scapular Retraction with Resistance  - 1 x daily - 7 x weekly - 3 sets - 10 reps  ASSESSMENT:  CLINICAL IMPRESSION: The patient continues to have spasming on the left side of his T-spine but it has improved. He continues to feel benefit from the needling and soft tissue mobilization, We reviewed stretches he can do in bed in the morning. We continue to advance is postural exercises. We reviewed a sitting series today with dumbbells. We reviewed the importance of doing these exercises at home. He was advised he can use bottles of water  at home.   Patient is a 73 year old male who presents with left-sided low back pain.  Earlier this month he had an acute onset of right-sided low back pain as well.  He had a course of prednisone which resolved this.  At this time he feels like most of his pain is back to being focused on the left side.  Has had success with physical therapy before.  He is not currently doing any of the exercises he was doing before.  He has increased pain with activity.  He has limited lumbar motion as well as limited bilateral lower extremity strength.  He has a long complicated history of abdominal surgeries.  He currently has mesh that causes him pain with activity.  He would benefit from skilled therapy to improve core strength and stability, reduce pain in lower back, and reviewed total program future exacerbations of pain.   OBJECTIVE IMPAIRMENTS: Abnormal gait, decreased activity tolerance, decreased endurance, decreased ROM, decreased strength, improper body mechanics, and postural dysfunction.   ACTIVITY LIMITATIONS: carrying,  lifting, bending, standing, squatting, sleeping, stairs, and transfers  PARTICIPATION LIMITATIONS: meal prep, cleaning, laundry, shopping, community activity, occupation, and yard work  PERSONAL FACTORS: 1-2  comorbidities: Multiple abdominal surgeries, history of kidney stones are also affecting patient's functional outcome.   REHAB POTENTIAL: Good  CLINICAL DECISION MAKING: Evolving/moderate complexity  EVALUATION COMPLEXITY: Moderate   GOALS: Goals reviewed with patient? Yes SHORT TERM GOALS: Target date: 08/02/2023     Patient will increase lumbar flexion to full without pain Baseline: Goal status:  improving 5/15   2.  Patient will increase gross bilateral lower extremity strength by 5 pounds Baseline:  Goal status: not tested 5/15   3.  Patient will be independent and complaint with basic HEP  Baseline:  Goal status: INITIAL     LONG TERM GOALS: Target date: 08/30/2023     Patient will return to using the weed wacker without pain  Baseline:  Goal status: INITIAL   2.  Patient will reach overhead to a shelf without pain  Baseline:  Goal status: INITIAL   3.  Patient will show compliance to long term program for strengthening  Baseline:  Goal status: INITIAL  PLAN:  PT FREQUENCY: 2x/week  PT DURATION: 8 weeks  PLANNED INTERVENTIONS: 97110-Therapeutic exercises, 97530- Therapeutic activity, [id number]- Neuromuscular re-education, 97535- Self Care, [id number]- Manual therapy, [id number]- Gait training, [phone]- Aquatic Therapy, [id number]- Electrical stimulation (unattended), 97035- Ultrasound, Patient/Family education, Stair training, Taping, Dry Needling, DME instructions, Cryotherapy, and Moist heat .  PLAN FOR NEXT SESSION: Consider trigger point dry needling of lumbar spine and gluteals.  Soft tissue mobilization to lumbar spine.  Consider LAD. Continue to expand home exercises and work on getting the patient to buy into home exercises.    Kitty Perkins, PT 08/11/2023, 10:31  AM   I have reviewed and concur with this student's documentation.    During this treatment session, the therapist was present, participating in and directing the treatment.

## 2023-08-15 ENCOUNTER — Encounter (HOSPITAL_BASED_OUTPATIENT_CLINIC_OR_DEPARTMENT_OTHER): Payer: Self-pay | Admitting: Physical Therapy

## 2023-08-15 ENCOUNTER — Ambulatory Visit (HOSPITAL_BASED_OUTPATIENT_CLINIC_OR_DEPARTMENT_OTHER): Admitting: Physical Therapy

## 2023-08-15 DIAGNOSIS — G8929 Other chronic pain: Secondary | ICD-10-CM | POA: Diagnosis not present

## 2023-08-15 DIAGNOSIS — M546 Pain in thoracic spine: Secondary | ICD-10-CM | POA: Diagnosis not present

## 2023-08-15 DIAGNOSIS — R293 Abnormal posture: Secondary | ICD-10-CM | POA: Diagnosis not present

## 2023-08-15 DIAGNOSIS — M25512 Pain in left shoulder: Secondary | ICD-10-CM | POA: Diagnosis not present

## 2023-08-15 DIAGNOSIS — M5459 Other low back pain: Secondary | ICD-10-CM

## 2023-08-15 NOTE — Therapy (Signed)
 OUTPATIENT PHYSICAL THERAPY THORACOLUMBAR EVALUATION   Patient Name: Miguel [name] MRN: [id number] DOB:1950/11/10, 73 y.o., male Today's Date: 08/15/2023  END OF SESSION:  PT End of Session - 08/15/23 1102     Visit Number 7    Number of Visits 16    Date for PT Re-Evaluation 08/31/23    PT Start Time 1100    PT Stop Time 1143    PT Time Calculation (min) 43 min    Activity Tolerance Patient tolerated treatment well;No increased pain    Behavior During Therapy WFL for tasks assessed/performed                 Past Medical History:  Diagnosis Date   Anemia    Anxiety    BPH (benign prostatic hyperplasia)    Gastritis    GERD (gastroesophageal reflux disease)    "seldom" (07/16/2016)   GI bleed due to NSAIDs 10/27/2015   History of blood transfusion 06/2016   post OR/notes 07/15/2016   History of hiatal hernia    History of kidney stones    surgery to remove stone   Hyperlipidemia    Hypertension    no meds    Melanoma of back (HCC)    "mid back"   Sigmoid diverticulitis    with perforation   Past Surgical History:  Procedure Laterality Date   COLON SURGERY     sigmoid   COLOSTOMY TAKEDOWN N/A 06/28/2016   Procedure: COLOSTOMY TAKEDOWN;  Surgeon: Oza Blumenthal, MD;  Location: [facility] OR;  Service: General;  Laterality: N/A;   CYSTOSCOPY WITH RETROGRADE PYELOGRAM, URETEROSCOPY AND STENT PLACEMENT Bilateral 02/12/2020   Procedure: CYSTOSCOPY WITH BILATERAL RETROGRADE PYELOGRAM,  AND LITHOPEXY;  Surgeon: Sherlyn Ditto, MD;  Location: [facility];  Service: Urology;  Laterality: Bilateral;  1 HR   ESOPHAGOGASTRODUODENOSCOPY N/A 10/27/2015   Procedure: ESOPHAGOGASTRODUODENOSCOPY (EGD);  Surgeon: Ozell Blunt, MD;  Location: [facility] ENDOSCOPY;  Service: Endoscopy;  Laterality: N/A;   ESOPHAGOGASTRODUODENOSCOPY N/A 10/29/2015   Procedure: ESOPHAGOGASTRODUODENOSCOPY (EGD);  Surgeon: Lanita Pitman, MD;  Location: [facility] ENDOSCOPY;  Service: Endoscopy;  Laterality: N/A;    ESOPHAGOGASTRODUODENOSCOPY N/A 10/30/2015   Procedure: ESOPHAGOGASTRODUODENOSCOPY (EGD);  Surgeon: Ozell Blunt, MD;  Location: [facility] ENDOSCOPY;  Service: Endoscopy;  Laterality: N/A;   g tube discontinued  04/2020   per patient   GASTROSTOMY TUBE PLACEMENT  11/21/2015   REDUCTION OF HIATAL HERNIA , REPAIR HIATAL HERNIA, RESECTION SMALL BOWEL WITH ANASTOMOSIS, PLACEMENT GASTROSTOMY TUBE, PLACEMENT DUODENOSTOMY TUBE (N/A)   HEMORRHOID BANDING  X 2   HERNIA REPAIR     HIATAL HERNIA REPAIR N/A 11/21/2015   Procedure: REDUCTION OF HIATAL HERNIA , REPAIR HIATAL HERNIA, RESECTION SMALL BOWEL WITH ANASTOMOSIS, PLACEMENT GASTROSTOMY TUBE, PLACEMENT DUODENOSTOMY TUBE;  Surgeon: Derral Flick, MD;  Location: [facility] OR;  Service: General;  Laterality: N/A;   HOLMIUM LASER APPLICATION Right 02/12/2020   Procedure: HOLMIUM LASER APPLICATION;  Surgeon: Sherlyn Ditto, MD;  Location: [facility];  Service: Urology;  Laterality: Right;   INCISIONAL HERNIA REPAIR  06/28/2016   open/notes 07/15/2016   INCISIONAL HERNIA REPAIR  03/17/2018   WITH MESH   INCISIONAL HERNIA REPAIR N/A 03/17/2018   Procedure: INCISIONAL HERNIA REPAIR WITH MESH;  Surgeon: Oza Blumenthal, MD;  Location: [facility] OR;  Service: General;  Laterality: N/A;   INCISIONAL HERNIA REPAIR N/A 10/16/2020   Procedure: Estrella Hench HERNIA REPAIR WITH MESH;  Surgeon: Oza Blumenthal, MD;  Location: [facility] OR;  Service: General;  Laterality: N/A;   INGUINAL HERNIA REPAIR  Bilateral 09/28/2018   INGUINAL HERNIA REPAIR Bilateral 09/28/2018   Procedure: BILATERAL OPEN INGUINAL HERNIA REPAIR WITH MESH;  Surgeon: Oza Blumenthal, MD;  Location: [facility] OR;  Service: General;  Laterality: Bilateral;  GENERAL AND TAP BLOCK   INSERTION OF MESH N/A 03/17/2018   Procedure: INSERTION OF MESH;  Surgeon: Oza Blumenthal, MD;  Location: [facility] OR;  Service: General;  Laterality: N/A;   INSERTION OF MESH Bilateral 09/28/2018   Procedure: Insertion Of Mesh;  Surgeon: Oza Blumenthal,  MD;  Location: [facility] OR;  Service: General;  Laterality: Bilateral;   IR CM INJ ANY COLONIC TUBE W/FLUORO  02/04/2017   IR GUIDED DRAIN W CATHETER PLACEMENT  07/06/2016   /NOTES 07/15/2016   IR PATIENT EVAL TECH 0-60 MINS  06/28/2019   IR RADIOLOGIST EVAL & MGMT  07/27/2016   IR RADIOLOGIST EVAL & MGMT  08/17/2016   IR RADIOLOGIST EVAL & MGMT  08/26/2016   IR REPLACE G-TUBE SIMPLE WO FLUORO  07/28/2017   IR REPLACE G-TUBE SIMPLE WO FLUORO  01/31/2018   IR REPLACE G-TUBE SIMPLE WO FLUORO  10/16/2018   IR REPLACE G-TUBE SIMPLE WO FLUORO  03/05/2019   IR REPLACE G-TUBE SIMPLE WO FLUORO  08/22/2019   IR REPLACE G-TUBE SIMPLE WO FLUORO  01/08/2020   IR REPLC GASTRO/COLONIC TUBE PERCUT W/FLUORO  08/04/2016   IR REPLC GASTRO/COLONIC TUBE PERCUT W/FLUORO  01/14/2017   IR US  GUIDE BX ASP/DRAIN  08/04/2016   KNEE CARTILAGE SURGERY Right 1971   "opened me up"   LAPAROTOMY N/A 07/05/2015   Procedure: PARTIAL SIGMOID COLECTOMY AND COLOSTOMY;  Surgeon: Oza Blumenthal, MD;  Location: [facility] OR;  Service: General;  Laterality: N/A;   MELANOMA EXCISION  2001   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  10/30/2015   Procedure: REMOVAL OF DISTAL STOMACH;  Surgeon: Jerryl Morin, MD;  Location: [facility] OR;  Service: General;;   REPAIR OF PERFORATED ULCER N/A 10/30/2015   Procedure: REPAIR OF BLEEDING  ULCER;  Surgeon: Jerryl Morin, MD;  Location: [facility] OR;  Service: General;  Laterality: N/A;   TUMOR EXCISION  2009   "back; fatty tumor"   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   Bilateral inguinal hernia 09/28/2018   Incisional hernia 03/17/2018   Trigger thumb, left thumb 01/19/2018   Trigger finger, left index finger 07/18/2017   Trigger finger, right ring finger 01/31/2017   Trigger finger of left thumb 01/03/2017   Trigger finger of right thumb 01/03/2017   Trigger index finger of left hand 01/03/2017   Trigger index  finger of right hand 01/03/2017   Malnutrition of moderate degree 07/19/2016   Intra-abdominal abscess (HCC) 07/16/2016   Cellulitis 07/15/2016   S/P colostomy takedown 06/28/2016   Pain in thoracic spine 06/09/2016   Mid back pain 06/09/2016   Postoperative fever 11/19/2015   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   Severe protein-calorie malnutrition (HCC) 11/17/2015   Sepsis (HCC) 11/14/2015   Hiccups 11/14/2015   AKI (acute kidney injury) (HCC) 11/14/2015   Fever    Leg swelling    Left shoulder pain    Muscle spasm of left shoulder    GI bleed 10/27/2015   Acute blood loss anemia 10/27/2015   Syncope 10/27/2015   Hyperglycemia 10/27/2015   Hypotension 10/27/2015   Neck pain 10/27/2015   Hematemesis 10/27/2015   Hematochezia 10/27/2015   Diverticulitis of colon with perforation 07/05/2015  PCP: Terris Fickle MD   REFERRING PROVIDER: Benedetto Brady MD   REFERRING DIAG:  DiagnosisM54.50 (ICD-10-CM) - Low back pain, unspecified  Rationale for Evaluation and Treatment: Rehabilitation  THERAPY DIAG:  Other low back pain  Pain in thoracic spine  Abnormal posture  ONSET DATE: 1 month prior   SUBJECTIVE:                                                                                                                                                                                           SUBJECTIVE STATEMENT: The patient did well over the weekend until yesterday. He reports feeling a little dizzy. He thinks he was dehydrated. He reports yesterday he had a increase in pain in his eft T-spine   PERTINENT HISTORY:  Anxiety, anemia, kidney stones,  PAIN:  Are you having pain? Yes: NPRS scale: 5/10 Pain location: left low back  Pain description: aching/constant  Aggravating factors: Standing and walking  Relieving factors: rest   PRECAUTIONS: None  RED FLAGS: None   WEIGHT BEARING RESTRICTIONS: No  FALLS:  Has patient fallen in last 6 months? No  LIVING  ENVIRONMENT: Nothing pertinent   OCCUPATION:  Retired   Presenter, broadcasting:  Yard work   PLOF: Independent  PATIENT GOALS:  To have less pain To get stronger    NEXT MD VISIT:  Nothing scheduled.   OBJECTIVE:  Note: Objective measures were completed at Evaluation unless otherwise noted.  DIAGNOSTIC FINDINGS:  MRI 2022   IMPRESSION: 1. Multilevel spondylosis of the lumbar spine as described. 2. Mild foraminal narrowing bilaterally at L1-2 and L2-3 is worse on the left. Slight retrolisthesis and uncovering of a broad-based disc protrusion contribute at both levels. 3. Mild central and bilateral foraminal narrowing at L3-4 secondary to a broad-based disc protrusion and facet hypertrophy. 4. Mild central and mild to moderate foraminal narrowing bilaterally at L4-5 is worse right than left. 5. Moderate left and mild right foraminal stenosis at L5-S1 due to endplate and facet spurring. 6. Cholelithiasis without evidence for cholecystitis.  PATIENT SURVEYS:  LEFS 49/80  COGNITION: Overall cognitive status: Within functional limits for tasks assessed     SENSATION: WFL    POSTURE: rounded shoulders, forward head, and flexed trunk   PALPATION: Significant spasming in left paraspinals and gluteal   LUMBAR ROM:   AROM eval  Flexion Limited 25% with pain   Extension Painful past neutral   Right lateral flexion   Left lateral flexion   Right rotation   Left rotation    (Blank rows = not tested)  LOWER EXTREMITY ROM:     LOWER EXTREMITY MMT:  MMT Right eval Left eval  Hip flexion 17.5 16.3  Hip extension    Hip abduction 28.7 26.3  Hip adduction    Hip internal rotation    Hip external rotation    Knee flexion    Knee extension 34.3 32.1  Ankle dorsiflexion    Ankle plantarflexion    Ankle inversion    Ankle eversion     (Blank rows = not tested)    GAIT: Flexed posture   TREATMENT DATE:  5/19 Initial Treatment: Pt instructed on Dry Needling  rational, procedures, and possible side effects. Pt instructed to expect mild to moderate muscle soreness later in the day and/or into the next day.  Pt instructed in methods to reduce muscle soreness. Pt instructed to continue prescribed HEP. Because Dry Needling was performed over or adjacent to a lung field, pt was educated on S/S of pneumothorax and to seek immediate medical attention should they occur.  Patient was educated on signs and symptoms of infection and other risk factors and advised to seek medical attention should they occur.  Patient verbalized understanding of these instructions and education.   Patient Verbal Consent Given: Yes Education Handout Provided: Yes Muscles Treated: .30x5o needle 3 spots in t-8-t-12 and  Electrical Stimulation Performed: No Treatment Response/Outcome: great twitch    Manual:  Trigger point therapy and STM to lumbar paraspinals and L QL region Thoracic and lumbar PA's grade 2 Skilled palpation of trigger points   Neuro-re-ed:  Supine march 3x10 with ab bracing Supine hip abduction 3x10 green  Bilateral shld flexion with weight 3x10 1 lb  5/15 Initial Treatment: Pt instructed on Dry Needling rational, procedures, and possible side effects. Pt instructed to expect mild to moderate muscle soreness later in the day and/or into the next day.  Pt instructed in methods to reduce muscle soreness. Pt instructed to continue prescribed HEP. Because Dry Needling was performed over or adjacent to a lung field, pt was educated on S/S of pneumothorax and to seek immediate medical attention should they occur.  Patient was educated on signs and symptoms of infection and other risk factors and advised to seek medical attention should they occur.  Patient verbalized understanding of these instructions and education.   Patient Verbal Consent Given: Yes Education Handout Provided: Yes Muscles Treated: .30x5o needle 2 spots in t-10-t-12 and 2 spots in L1-L3   Electrical Stimulation Performed: No Treatment Response/Outcome: great twitch   There-ex:  Anterior flexion stretching 2x10  LTR 2x10   Nuero-Re-ed  Biceps curl with posture 3lbs 3x12  Punches 3 lbs 2x10 with cuing for posture  Overhead press 3x12 2 lbs with cuing for posture      5/12 Manual:  Trigger point therapy and STM to lumbar paraspinals and L QL region Thoracic and lumbar PA's grade 2 Skilled palpation of trigger points   Trigger Point Dry Needling  Initial Treatment: Pt instructed on Dry Needling rational, procedures, and possible side effects. Pt instructed to expect mild to moderate muscle soreness later in the day and/or into the next day.  Pt instructed in methods to reduce muscle soreness. Pt instructed to continue prescribed HEP. Because Dry Needling was performed over or adjacent to a lung field, pt was educated on S/S of pneumothorax and to seek immediate medical attention should they occur.  Patient was educated on signs and symptoms of infection and other risk factors and advised to seek medical attention should they occur.  Patient verbalized understanding of these instructions and education.  Patient Verbal Consent Given: Yes Education Handout Provided: Yes Muscles Treated: .30x5o needle 2 spots in t-10-t-12 and 2 spots in L1-L3  Electrical Stimulation Performed: No Treatment Response/Outcome: great twitch   Neuro-re-ed:  Bilateral er 3x10 red with cuing for posture and breathing  Horizontal abduction 3x10  Seated hip abdcution 3x10 red  There-ex:  Flexion stretch with wand 3x10                                                                                                                                        PATIENT EDUCATION:  Education details: HEP, symptom management  Person educated: Patient Education method: Explanation, Demonstration, Tactile cues, Verbal cues, and Handouts Education comprehension: verbalized understanding,  returned demonstration, verbal cues required, tactile cues required, and needs further education  HOME EXERCISE PROGRAM: Access Code: [email or web address] URL: [email or web address] Date: 07/05/2023 Prepared by: Signa Drier  Exercises - Standing Glute Med Mobilization with Small Ball on Wall  - 1 x daily - 7 x weekly - 3 sets - 10 reps - Low Horizontal Abduction with Resistance  - 1 x daily - 7 x weekly - 3 sets - 10 reps - Shoulder External Rotation and Scapular Retraction with Resistance  - 1 x daily - 7 x weekly - 3 sets - 10 reps  ASSESSMENT:  CLINICAL IMPRESSION: The patient had a similar area of spasming in his T-spine. He tolerated needling well. He had a great twitch. We continue to work on expanding his HEP for core strengthening. We worked on shoulder flexion for functional reach today. He tolerated well. He can sit in improved posture when he thinks about it. We continue to encouraged him to work on his posterior chain exercises at home.   Patient is a 73 year old male who presents with left-sided low back pain.  Earlier this month he had an acute onset of right-sided low back pain as well.  He had a course of prednisone which resolved this.  At this time he feels like most of his pain is back to being focused on the left side.  Has had success with physical therapy before.  He is not currently doing any of the exercises he was doing before.  He has increased pain with activity.  He has limited lumbar motion as well as limited bilateral lower extremity strength.  He has a long complicated history of abdominal surgeries.  He currently has mesh that causes him pain with activity.  He would benefit from skilled therapy to improve core strength and stability, reduce pain in lower back, and reviewed total program future exacerbations of pain.   OBJECTIVE IMPAIRMENTS: Abnormal gait, decreased activity tolerance, decreased endurance, decreased ROM, decreased strength, improper body  mechanics, and postural dysfunction.   ACTIVITY LIMITATIONS: carrying, lifting, bending, standing, squatting, sleeping, stairs, and transfers  PARTICIPATION LIMITATIONS: meal prep, cleaning, laundry, shopping, community activity, occupation, and yard work  PERSONAL FACTORS: 1-2  comorbidities: Multiple abdominal surgeries, history of kidney stones are also affecting patient's functional outcome.   REHAB POTENTIAL: Good  CLINICAL DECISION MAKING: Evolving/moderate complexity  EVALUATION COMPLEXITY: Moderate   GOALS: Goals reviewed with patient? Yes SHORT TERM GOALS: Target date: 08/02/2023     Patient will increase lumbar flexion to full without pain Baseline: Goal status: INITIAL   2.  Patient will increase gross bilateral lower extremity strength by 5 pounds Baseline:  Goal status: INITIAL   3.  Patient will be independent and complaint with basic HEP  Baseline:  Goal status: INITIAL     LONG TERM GOALS: Target date: 08/30/2023     Patient will return to using the weed wacker without pain  Baseline:  Goal status: INITIAL   2.  Patient will reach overhead to a shelf without pain  Baseline:  Goal status: INITIAL   3.  Patient will show compliance to long term program for strengthening  Baseline:  Goal status: INITIAL  PLAN:  PT FREQUENCY: 2x/week  PT DURATION: 8 weeks  PLANNED INTERVENTIONS: [id number]-Therapeutic exercises, 97530- Therapeutic activity, [id number]- Neuromuscular re-education, 97535- Self Care, [id number]- Manual therapy, [id number]- Gait training, [phone]- Aquatic Therapy, [id number]- Electrical stimulation (unattended), 97035- Ultrasound, Patient/Family education, Stair training, Taping, Dry Needling, DME instructions, Cryotherapy, and Moist heat .  PLAN FOR NEXT SESSION: Consider trigger point dry needling of lumbar spine and gluteals.  Soft tissue mobilization to lumbar spine.  Consider LAD. Continue to expand home exercises and work on getting the patient to buy into  home exercises.    Kitty Perkins, PT 08/15/2023, 11:32 AM   I have reviewed and concur with this student's documentation.    During this treatment session, the therapist was present, participating in and directing the treatment.

## 2023-08-16 ENCOUNTER — Encounter (HOSPITAL_BASED_OUTPATIENT_CLINIC_OR_DEPARTMENT_OTHER): Payer: Self-pay | Admitting: Physical Therapy

## 2023-08-17 DIAGNOSIS — S0501XA Injury of conjunctiva and corneal abrasion without foreign body, right eye, initial encounter: Secondary | ICD-10-CM | POA: Diagnosis not present

## 2023-08-18 ENCOUNTER — Ambulatory Visit (HOSPITAL_BASED_OUTPATIENT_CLINIC_OR_DEPARTMENT_OTHER): Admitting: Physical Therapy

## 2023-08-18 ENCOUNTER — Encounter (HOSPITAL_BASED_OUTPATIENT_CLINIC_OR_DEPARTMENT_OTHER): Payer: Self-pay | Admitting: Physical Therapy

## 2023-08-18 DIAGNOSIS — M5459 Other low back pain: Secondary | ICD-10-CM | POA: Diagnosis not present

## 2023-08-18 DIAGNOSIS — R293 Abnormal posture: Secondary | ICD-10-CM

## 2023-08-18 DIAGNOSIS — M25512 Pain in left shoulder: Secondary | ICD-10-CM | POA: Diagnosis not present

## 2023-08-18 DIAGNOSIS — M546 Pain in thoracic spine: Secondary | ICD-10-CM

## 2023-08-18 DIAGNOSIS — G8929 Other chronic pain: Secondary | ICD-10-CM | POA: Diagnosis not present

## 2023-08-18 NOTE — Therapy (Signed)
 OUTPATIENT PHYSICAL THERAPY THORACOLUMBAR EVALUATION   Patient Name: Miguel [name] MRN: [id number] DOB:18-Jun-1950, 73 y.o., male Today's Date: 08/18/2023  END OF SESSION:  PT End of Session - 08/18/23 0938     Visit Number 8    Number of Visits 16    Date for PT Re-Evaluation 08/31/23    PT Start Time 0934    PT Stop Time 1015    PT Time Calculation (min) 41 min    Activity Tolerance Patient tolerated treatment well;No increased pain    Behavior During Therapy WFL for tasks assessed/performed                 Past Medical History:  Diagnosis Date   Anemia    Anxiety    BPH (benign prostatic hyperplasia)    Gastritis    GERD (gastroesophageal reflux disease)    "seldom" (07/16/2016)   GI bleed due to NSAIDs 10/27/2015   History of blood transfusion 06/2016   post OR/notes 07/15/2016   History of hiatal hernia    History of kidney stones    surgery to remove stone   Hyperlipidemia    Hypertension    no meds    Melanoma of back (HCC)    "mid back"   Sigmoid diverticulitis    with perforation   Past Surgical History:  Procedure Laterality Date   COLON SURGERY     sigmoid   COLOSTOMY TAKEDOWN N/A 06/28/2016   Procedure: COLOSTOMY TAKEDOWN;  Surgeon: Oza Blumenthal, MD;  Location: [facility] OR;  Service: General;  Laterality: N/A;   CYSTOSCOPY WITH RETROGRADE PYELOGRAM, URETEROSCOPY AND STENT PLACEMENT Bilateral 02/12/2020   Procedure: CYSTOSCOPY WITH BILATERAL RETROGRADE PYELOGRAM,  AND LITHOPEXY;  Surgeon: Sherlyn Ditto, MD;  Location: [facility];  Service: Urology;  Laterality: Bilateral;  1 HR   ESOPHAGOGASTRODUODENOSCOPY N/A 10/27/2015   Procedure: ESOPHAGOGASTRODUODENOSCOPY (EGD);  Surgeon: Ozell Blunt, MD;  Location: [facility] ENDOSCOPY;  Service: Endoscopy;  Laterality: N/A;   ESOPHAGOGASTRODUODENOSCOPY N/A 10/29/2015   Procedure: ESOPHAGOGASTRODUODENOSCOPY (EGD);  Surgeon: Lanita Pitman, MD;  Location: [facility] ENDOSCOPY;  Service: Endoscopy;  Laterality: N/A;    ESOPHAGOGASTRODUODENOSCOPY N/A 10/30/2015   Procedure: ESOPHAGOGASTRODUODENOSCOPY (EGD);  Surgeon: Ozell Blunt, MD;  Location: [facility] ENDOSCOPY;  Service: Endoscopy;  Laterality: N/A;   g tube discontinued  04/2020   per patient   GASTROSTOMY TUBE PLACEMENT  11/21/2015   REDUCTION OF HIATAL HERNIA , REPAIR HIATAL HERNIA, RESECTION SMALL BOWEL WITH ANASTOMOSIS, PLACEMENT GASTROSTOMY TUBE, PLACEMENT DUODENOSTOMY TUBE (N/A)   HEMORRHOID BANDING  X 2   HERNIA REPAIR     HIATAL HERNIA REPAIR N/A 11/21/2015   Procedure: REDUCTION OF HIATAL HERNIA , REPAIR HIATAL HERNIA, RESECTION SMALL BOWEL WITH ANASTOMOSIS, PLACEMENT GASTROSTOMY TUBE, PLACEMENT DUODENOSTOMY TUBE;  Surgeon: Derral Flick, MD;  Location: [facility] OR;  Service: General;  Laterality: N/A;   HOLMIUM LASER APPLICATION Right 02/12/2020   Procedure: HOLMIUM LASER APPLICATION;  Surgeon: Sherlyn Ditto, MD;  Location: [facility];  Service: Urology;  Laterality: Right;   INCISIONAL HERNIA REPAIR  06/28/2016   open/notes 07/15/2016   INCISIONAL HERNIA REPAIR  03/17/2018   WITH MESH   INCISIONAL HERNIA REPAIR N/A 03/17/2018   Procedure: INCISIONAL HERNIA REPAIR WITH MESH;  Surgeon: Oza Blumenthal, MD;  Location[facility];  Service: General;  Laterality: N/A;   INCISIONAL HERNIA REPAIR N/A 10/16/2020   Procedure: Estrella Hench HERNIA REPAIR WITH MESH;  Surgeon: Oza Blumenthal, MD;  Location: [facility] OR;  Service: General;  Laterality: N/A;   INGUINAL HERNIA REPAIR  Bilateral 09/28/2018   INGUINAL HERNIA REPAIR Bilateral 09/28/2018   Procedure: BILATERAL OPEN INGUINAL HERNIA REPAIR WITH MESH;  Surgeon: Oza Blumenthal, MD;  Location: [facility] OR;  Service: General;  Laterality: Bilateral;  GENERAL AND TAP BLOCK   INSERTION OF MESH N/A 03/17/2018   Procedure: INSERTION OF MESH;  Surgeon: Oza Blumenthal, MD;  Location: [facility] OR;  Service: General;  Laterality: N/A;   INSERTION OF MESH Bilateral 09/28/2018   Procedure: Insertion Of Mesh;  Surgeon: Oza Blumenthal,  MD;  Location: [facility] OR;  Service: General;  Laterality: Bilateral;   IR CM INJ ANY COLONIC TUBE W/FLUORO  02/04/2017   IR GUIDED DRAIN W CATHETER PLACEMENT  07/06/2016   /NOTES 07/15/2016   IR PATIENT EVAL TECH 0-60 MINS  06/28/2019   IR RADIOLOGIST EVAL & MGMT  07/27/2016   IR RADIOLOGIST EVAL & MGMT  08/17/2016   IR RADIOLOGIST EVAL & MGMT  08/26/2016   IR REPLACE G-TUBE SIMPLE WO FLUORO  07/28/2017   IR REPLACE G-TUBE SIMPLE WO FLUORO  01/31/2018   IR REPLACE G-TUBE SIMPLE WO FLUORO  10/16/2018   IR REPLACE G-TUBE SIMPLE WO FLUORO  03/05/2019   IR REPLACE G-TUBE SIMPLE WO FLUORO  08/22/2019   IR REPLACE G-TUBE SIMPLE WO FLUORO  01/08/2020   IR REPLC GASTRO/COLONIC TUBE PERCUT W/FLUORO  08/04/2016   IR REPLC GASTRO/COLONIC TUBE PERCUT W/FLUORO  01/14/2017   IR US  GUIDE BX ASP/DRAIN  08/04/2016   KNEE CARTILAGE SURGERY Right 1971   "opened me up"   LAPAROTOMY N/A 07/05/2015   Procedure: PARTIAL SIGMOID COLECTOMY AND COLOSTOMY;  Surgeon: Oza Blumenthal, MD;  Location: [facility] OR;  Service: General;  Laterality: N/A;   MELANOMA EXCISION  2001   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  10/30/2015   Procedure: REMOVAL OF DISTAL STOMACH;  Surgeon: Jerryl Morin, MD;  Location: [facility] OR;  Service: General;;   REPAIR OF PERFORATED ULCER N/A 10/30/2015   Procedure: REPAIR OF BLEEDING  ULCER;  Surgeon: Jerryl Morin, MD;  Location: [facility] OR;  Service: General;  Laterality: N/A;   TUMOR EXCISION  2009   "back; fatty tumor"   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   Bilateral inguinal hernia 09/28/2018   Incisional hernia 03/17/2018   Trigger thumb, left thumb 01/19/2018   Trigger finger, left index finger 07/18/2017   Trigger finger, right ring finger 01/31/2017   Trigger finger of left thumb 01/03/2017   Trigger finger of right thumb 01/03/2017   Trigger index finger of left hand 01/03/2017   Trigger index  finger of right hand 01/03/2017   Malnutrition of moderate degree 07/19/2016   Intra-abdominal abscess (HCC) 07/16/2016   Cellulitis 07/15/2016   S/P colostomy takedown 06/28/2016   Pain in thoracic spine 06/09/2016   Mid back pain 06/09/2016   Postoperative fever 11/19/2015   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   Severe protein-calorie malnutrition (HCC) 11/17/2015   Sepsis (HCC) 11/14/2015   Hiccups 11/14/2015   AKI (acute kidney injury) (HCC) 11/14/2015   Fever    Leg swelling    Left shoulder pain    Muscle spasm of left shoulder    GI bleed 10/27/2015   Acute blood loss anemia 10/27/2015   Syncope 10/27/2015   Hyperglycemia 10/27/2015   Hypotension 10/27/2015   Neck pain 10/27/2015   Hematemesis 10/27/2015   Hematochezia 10/27/2015   Diverticulitis of colon with perforation 07/05/2015  PCP: Terris Fickle MD   REFERRING PROVIDER: Benedetto Brady MD   REFERRING DIAG:  DiagnosisM54.50 (ICD-10-CM) - Low back pain, unspecified  Rationale for Evaluation and Treatment: Rehabilitation  THERAPY DIAG:  No diagnosis found.  ONSET DATE: 1 month prior   SUBJECTIVE:                                                                                                                                                                                           SUBJECTIVE STATEMENT: Patient reports that last night he had another attack of pain similar to the one that put him in the hospital last week.  He was advised that if this happens again he should talk to his doctor.  He reports is resolved.  He reports there is some residual back pain.   PERTINENT HISTORY:  Anxiety, anemia, kidney stones,  PAIN:  Are you having pain? Yes: NPRS scale: 5/10 Pain location: left low back  Pain description: aching/constant  Aggravating factors: Standing and walking  Relieving factors: rest   PRECAUTIONS: None  RED FLAGS: None   WEIGHT BEARING RESTRICTIONS: No  FALLS:  Has patient fallen in  last 6 months? No  LIVING ENVIRONMENT: Nothing pertinent   OCCUPATION:  Retired   Presenter, broadcasting:  Yard work   PLOF: Independent  PATIENT GOALS:  To have less pain To get stronger    NEXT MD VISIT:  Nothing scheduled.   OBJECTIVE:  Note: Objective measures were completed at Evaluation unless otherwise noted.  DIAGNOSTIC FINDINGS:  MRI 2022   IMPRESSION: 1. Multilevel spondylosis of the lumbar spine as described. 2. Mild foraminal narrowing bilaterally at L1-2 and L2-3 is worse on the left. Slight retrolisthesis and uncovering of a broad-based disc protrusion contribute at both levels. 3. Mild central and bilateral foraminal narrowing at L3-4 secondary to a broad-based disc protrusion and facet hypertrophy. 4. Mild central and mild to moderate foraminal narrowing bilaterally at L4-5 is worse right than left. 5. Moderate left and mild right foraminal stenosis at L5-S1 due to endplate and facet spurring. 6. Cholelithiasis without evidence for cholecystitis.  PATIENT SURVEYS:  LEFS 49/80  COGNITION: Overall cognitive status: Within functional limits for tasks assessed     SENSATION: WFL    POSTURE: rounded shoulders, forward head, and flexed trunk   PALPATION: Significant spasming in left paraspinals and gluteal   LUMBAR ROM:   AROM eval  Flexion Limited 25% with pain   Extension Painful past neutral   Right lateral flexion   Left lateral flexion   Right rotation   Left rotation    (Blank rows = not tested)  LOWER EXTREMITY  ROM:     LOWER EXTREMITY MMT:    MMT Right eval Left eval  Hip flexion 17.5 16.3  Hip extension    Hip abduction 28.7 26.3  Hip adduction    Hip internal rotation    Hip external rotation    Knee flexion    Knee extension 34.3 32.1  Ankle dorsiflexion    Ankle plantarflexion    Ankle inversion    Ankle eversion     (Blank rows = not tested)    GAIT: Flexed posture   TREATMENT DATE:  5/23 Manual:  Trigger point  therapy and STM to lumbar paraspinals and L QL region Thoracic and lumbar PA's grade 2 Skilled palpation of trigger points  Neuro-re-ed:  Supine march 3x10 with ab bracing Bilateral shld flexion with weight 3x10 1 lb   Row with cueing for posture 3 x 15 green           Shoulder extension 3 x 15 green cueing for posture 5/19 Initial Treatment: Pt instructed on Dry Needling rational, procedures, and possible side effects. Pt instructed to expect mild to moderate muscle soreness later in the day and/or into the next day.  Pt instructed in methods to reduce muscle soreness. Pt instructed to continue prescribed HEP. Because Dry Needling was performed over or adjacent to a lung field, pt was educated on S/S of pneumothorax and to seek immediate medical attention should they occur.  Patient was educated on signs and symptoms of infection and other risk factors and advised to seek medical attention should they occur.  Patient verbalized understanding of these instructions and education.   Patient Verbal Consent Given: Yes Education Handout Provided: Yes Muscles Treated: .30x5o needle 3 spots in t-8-t-12 and  Electrical Stimulation Performed: No Treatment Response/Outcome: great twitch    Manual:  Trigger point therapy and STM to lumbar paraspinals and L QL region Thoracic and lumbar PA's grade 2 Skilled palpation of trigger points   Neuro-re-ed:  Supine march 3x10 with ab bracing Supine hip abduction 3x10 green  Bilateral shld flexion with weight 3x10 1 lb                                                                                                                                         PATIENT EDUCATION:  Education details: HEP, symptom management  Person educated: Patient Education method: Explanation, Demonstration, Tactile cues, Verbal cues, and Handouts Education comprehension: verbalized understanding, returned demonstration, verbal cues required, tactile cues  required, and needs further education  HOME EXERCISE PROGRAM: Access Code: [email or web address] URL: [email or web address] Date: 07/05/2023 Prepared by: Signa Drier  Exercises - Standing Glute Med Mobilization with Small Ball on Wall  - 1 x daily - 7 x weekly - 3 sets - 10 reps - Low Horizontal Abduction with Resistance  - 1 x daily - 7 x weekly - 3 sets - 10 reps - Shoulder External Rotation  and Scapular Retraction with Resistance  - 1 x daily - 7 x weekly - 3 sets - 10 reps  ASSESSMENT:  CLINICAL IMPRESSION: Patient felt reduce pain following manual therapy.  Will continue to work on postural correction.  He reports overall he feels like he is standing up taller.  He reports people have been telling him he seems to be standing taller.  He reports overall his pain is better.  He does have pain following his attacks of abdominal pain.  Is again advised to contact MD if this happens again.   Patient is a 73 year old male who presents with left-sided low back pain.  Earlier this month he had an acute onset of right-sided low back pain as well.  He had a course of prednisone which resolved this.  At this time he feels like most of his pain is back to being focused on the left side.  Has had success with physical therapy before.  He is not currently doing any of the exercises he was doing before.  He has increased pain with activity.  He has limited lumbar motion as well as limited bilateral lower extremity strength.  He has a long complicated history of abdominal surgeries.  He currently has mesh that causes him pain with activity.  He would benefit from skilled therapy to improve core strength and stability, reduce pain in lower back, and reviewed total program future exacerbations of pain.   OBJECTIVE IMPAIRMENTS: Abnormal gait, decreased activity tolerance, decreased endurance, decreased ROM, decreased strength, improper body mechanics, and postural dysfunction.   ACTIVITY LIMITATIONS:  carrying, lifting, bending, standing, squatting, sleeping, stairs, and transfers  PARTICIPATION LIMITATIONS: meal prep, cleaning, laundry, shopping, community activity, occupation, and yard work  PERSONAL FACTORS: 1-2 comorbidities: Multiple abdominal surgeries, history of kidney stones are also affecting patient's functional outcome.   REHAB POTENTIAL: Good  CLINICAL DECISION MAKING: Evolving/moderate complexity  EVALUATION COMPLEXITY: Moderate   GOALS: Goals reviewed with patient? Yes SHORT TERM GOALS: Target date: 08/02/2023     Patient will increase lumbar flexion to full without pain Baseline: Goal status: INITIAL   2.  Patient will increase gross bilateral lower extremity strength by 5 pounds Baseline:  Goal status: INITIAL   3.  Patient will be independent and complaint with basic HEP  Baseline:  Goal status: INITIAL     LONG TERM GOALS: Target date: 08/30/2023     Patient will return to using the weed wacker without pain  Baseline:  Goal status: INITIAL   2.  Patient will reach overhead to a shelf without pain  Baseline:  Goal status: INITIAL   3.  Patient will show compliance to long term program for strengthening  Baseline:  Goal status: INITIAL  PLAN:  PT FREQUENCY: 2x/week  PT DURATION: 8 weeks  PLANNED INTERVENTIONS: 97110-Therapeutic exercises, 97530- Therapeutic activity, [id number]- Neuromuscular re-education, 97535- Self Care, [id number]- Manual therapy, [id number]- Gait training, [phone]- Aquatic Therapy, [id number]- Electrical stimulation (unattended), 97035- Ultrasound, Patient/Family education, Stair training, Taping, Dry Needling, DME instructions, Cryotherapy, and Moist heat .  PLAN FOR NEXT SESSION: Consider trigger point dry needling of lumbar spine and gluteals.  Soft tissue mobilization to lumbar spine.  Consider LAD. Continue to expand home exercises and work on getting the patient to buy into home exercises.    Kitty Perkins, PT 08/18/2023, 10:14 AM    I have reviewed and concur with this student's documentation.    During this treatment session, the therapist was present, participating in and directing the  treatment.

## 2023-08-23 ENCOUNTER — Encounter (HOSPITAL_BASED_OUTPATIENT_CLINIC_OR_DEPARTMENT_OTHER): Payer: Self-pay | Admitting: Physical Therapy

## 2023-08-23 ENCOUNTER — Ambulatory Visit (HOSPITAL_BASED_OUTPATIENT_CLINIC_OR_DEPARTMENT_OTHER): Admitting: Physical Therapy

## 2023-08-23 DIAGNOSIS — M5459 Other low back pain: Secondary | ICD-10-CM

## 2023-08-23 DIAGNOSIS — G8929 Other chronic pain: Secondary | ICD-10-CM | POA: Diagnosis not present

## 2023-08-23 DIAGNOSIS — M546 Pain in thoracic spine: Secondary | ICD-10-CM

## 2023-08-23 DIAGNOSIS — R293 Abnormal posture: Secondary | ICD-10-CM

## 2023-08-23 DIAGNOSIS — M25512 Pain in left shoulder: Secondary | ICD-10-CM | POA: Diagnosis not present

## 2023-08-23 NOTE — Therapy (Signed)
 OUTPATIENT PHYSICAL THERAPY THORACOLUMBAR EVALUATION   Patient Name: Miguel [name] MRN: [id number] DOB:1951/03/08, 73 y.o., male Today's Date: 08/24/2023  END OF SESSION:  PT End of Session - 08/23/23 1502     Visit Number 9    Number of Visits 16    Date for PT Re-Evaluation 08/31/23    PT Start Time 1438   Therapy late   PT Stop Time 1519   2 min of TPDN not billed for   PT Time Calculation (min) 41 min    Activity Tolerance Patient tolerated treatment well;No increased pain    Behavior During Therapy WFL for tasks assessed/performed                  Past Medical History:  Diagnosis Date   Anemia    Anxiety    BPH (benign prostatic hyperplasia)    Gastritis    GERD (gastroesophageal reflux disease)    "seldom" (07/16/2016)   GI bleed due to NSAIDs 10/27/2015   History of blood transfusion 06/2016   post OR/notes 07/15/2016   History of hiatal hernia    History of kidney stones    surgery to remove stone   Hyperlipidemia    Hypertension    no meds    Melanoma of back (HCC)    "mid back"   Sigmoid diverticulitis    with perforation   Past Surgical History:  Procedure Laterality Date   COLON SURGERY     sigmoid   COLOSTOMY TAKEDOWN N/A 06/28/2016   Procedure: COLOSTOMY TAKEDOWN;  Surgeon: Oza Blumenthal, MD;  Location: [facility] OR;  Service: General;  Laterality: N/A;   CYSTOSCOPY WITH RETROGRADE PYELOGRAM, URETEROSCOPY AND STENT PLACEMENT Bilateral 02/12/2020   Procedure: CYSTOSCOPY WITH BILATERAL RETROGRADE PYELOGRAM,  AND LITHOPEXY;  Surgeon: Sherlyn Ditto, MD;  Location: [facility];  Service: Urology;  Laterality: Bilateral;  1 HR   ESOPHAGOGASTRODUODENOSCOPY N/A 10/27/2015   Procedure: ESOPHAGOGASTRODUODENOSCOPY (EGD);  Surgeon: Ozell Blunt, MD;  Location: [facility] ENDOSCOPY;  Service: Endoscopy;  Laterality: N/A;   ESOPHAGOGASTRODUODENOSCOPY N/A 10/29/2015   Procedure: ESOPHAGOGASTRODUODENOSCOPY (EGD);  Surgeon: Lanita Pitman, MD;  Location: [facility] ENDOSCOPY;   Service: Endoscopy;  Laterality: N/A;   ESOPHAGOGASTRODUODENOSCOPY N/A 10/30/2015   Procedure: ESOPHAGOGASTRODUODENOSCOPY (EGD);  Surgeon: Ozell Blunt, MD;  Location: [facility] ENDOSCOPY;  Service: Endoscopy;  Laterality: N/A;   g tube discontinued  04/2020   per patient   GASTROSTOMY TUBE PLACEMENT  11/21/2015   REDUCTION OF HIATAL HERNIA , REPAIR HIATAL HERNIA, RESECTION SMALL BOWEL WITH ANASTOMOSIS, PLACEMENT GASTROSTOMY TUBE, PLACEMENT DUODENOSTOMY TUBE (N/A)   HEMORRHOID BANDING  X 2   HERNIA REPAIR     HIATAL HERNIA REPAIR N/A 11/21/2015   Procedure: REDUCTION OF HIATAL HERNIA , REPAIR HIATAL HERNIA, RESECTION SMALL BOWEL WITH ANASTOMOSIS, PLACEMENT GASTROSTOMY TUBE, PLACEMENT DUODENOSTOMY TUBE;  Surgeon: Derral Flick, MD;  Location: [facility] OR;  Service: General;  Laterality: N/A;   HOLMIUM LASER APPLICATION Right 02/12/2020   Procedure: HOLMIUM LASER APPLICATION;  Surgeon: Sherlyn Ditto, MD;  Location: [facility];  Service: Urology;  Laterality: Right;   INCISIONAL HERNIA REPAIR  06/28/2016   open/notes 07/15/2016   INCISIONAL HERNIA REPAIR  03/17/2018   WITH MESH   INCISIONAL HERNIA REPAIR N/A 03/17/2018   Procedure: INCISIONAL HERNIA REPAIR WITH MESH;  Surgeon: Oza Blumenthal, MD;  Location: [facility] OR;  Service: General;  Laterality: N/A;   INCISIONAL HERNIA REPAIR N/A 10/16/2020   Procedure: Estrella Hench HERNIA REPAIR WITH MESH;  Surgeon: Oza Blumenthal, MD;  Location: [facility]  OR;  Service: General;  Laterality: N/A;   INGUINAL HERNIA REPAIR Bilateral 09/28/2018   INGUINAL HERNIA REPAIR Bilateral 09/28/2018   Procedure: BILATERAL OPEN INGUINAL HERNIA REPAIR WITH MESH;  Surgeon: Oza Blumenthal, MD;  Location: [facility] OR;  Service: General;  Laterality: Bilateral;  GENERAL AND TAP BLOCK   INSERTION OF MESH N/A 03/17/2018   Procedure: INSERTION OF MESH;  Surgeon: Oza Blumenthal, MD;  Location: [facility] OR;  Service: General;  Laterality: N/A;   INSERTION OF MESH Bilateral 09/28/2018   Procedure: Insertion  Of Mesh;  Surgeon: Oza Blumenthal, MD;  Location: [facility] OR;  Service: General;  Laterality: Bilateral;   IR CM INJ ANY COLONIC TUBE W/FLUORO  02/04/2017   IR GUIDED DRAIN W CATHETER PLACEMENT  07/06/2016   /NOTES 07/15/2016   IR PATIENT EVAL TECH 0-60 MINS  06/28/2019   IR RADIOLOGIST EVAL & MGMT  07/27/2016   IR RADIOLOGIST EVAL & MGMT  08/17/2016   IR RADIOLOGIST EVAL & MGMT  08/26/2016   IR REPLACE G-TUBE SIMPLE WO FLUORO  07/28/2017   IR REPLACE G-TUBE SIMPLE WO FLUORO  01/31/2018   IR REPLACE G-TUBE SIMPLE WO FLUORO  10/16/2018   IR REPLACE G-TUBE SIMPLE WO FLUORO  03/05/2019   IR REPLACE G-TUBE SIMPLE WO FLUORO  08/22/2019   IR REPLACE G-TUBE SIMPLE WO FLUORO  01/08/2020   IR REPLC GASTRO/COLONIC TUBE PERCUT W/FLUORO  08/04/2016   IR REPLC GASTRO/COLONIC TUBE PERCUT W/FLUORO  01/14/2017   IR US  GUIDE BX ASP/DRAIN  08/04/2016   KNEE CARTILAGE SURGERY Right 1971   "opened me up"   LAPAROTOMY N/A 07/05/2015   Procedure: PARTIAL SIGMOID COLECTOMY AND COLOSTOMY;  Surgeon: Oza Blumenthal, MD;  Location: [facility] OR;  Service: General;  Laterality: N/A;   MELANOMA EXCISION  2001   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  10/30/2015   Procedure: REMOVAL OF DISTAL STOMACH;  Surgeon: Jerryl Morin, MD;  Location: [facility] OR;  Service: General;;   REPAIR OF PERFORATED ULCER N/A 10/30/2015   Procedure: REPAIR OF BLEEDING  ULCER;  Surgeon: Jerryl Morin, MD;  Location: [facility] OR;  Service: General;  Laterality: N/A;   TUMOR EXCISION  2009   "back; fatty tumor"   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   Bilateral inguinal hernia 09/28/2018   Incisional hernia 03/17/2018   Trigger thumb, left thumb 01/19/2018   Trigger finger, left index finger 07/18/2017   Trigger finger, right ring finger 01/31/2017   Trigger finger of left thumb 01/03/2017   Trigger finger of right thumb 01/03/2017   Trigger index finger of left  hand 01/03/2017   Trigger index finger of right hand 01/03/2017   Malnutrition of moderate degree 07/19/2016   Intra-abdominal abscess (HCC) 07/16/2016   Cellulitis 07/15/2016   S/P colostomy takedown 06/28/2016   Pain in thoracic spine 06/09/2016   Mid back pain 06/09/2016   Postoperative fever 11/19/2015   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   Severe protein-calorie malnutrition (HCC) 11/17/2015   Sepsis (HCC) 11/14/2015   Hiccups 11/14/2015   AKI (acute kidney injury) (HCC) 11/14/2015   Fever    Leg swelling    Left shoulder pain    Muscle spasm of left shoulder    GI bleed 10/27/2015   Acute blood loss anemia 10/27/2015   Syncope 10/27/2015   Hyperglycemia 10/27/2015   Hypotension 10/27/2015   Neck pain 10/27/2015   Hematemesis 10/27/2015  Hematochezia 10/27/2015   Diverticulitis of colon with perforation 07/05/2015    PCP: Terris Fickle MD   REFERRING PROVIDER: Benedetto Brady MD   REFERRING DIAG:  DiagnosisM54.50 (ICD-10-CM) - Low back pain, unspecified  Rationale for Evaluation and Treatment: Rehabilitation  THERAPY DIAG:  Other low back pain  Pain in thoracic spine  Abnormal posture  ONSET DATE: 1 month prior   SUBJECTIVE:                                                                                                                                                                                           SUBJECTIVE STATEMENT: The patient had another attack of pain this morning. It has resolved at this time. He still feels some residual soreness. His back has otherwise been doing well.    PERTINENT HISTORY:  Anxiety, anemia, kidney stones,  PAIN:  Are you having pain? Yes: NPRS scale: 310 Pain location:left abdomen  Pain description: aching/constant  Aggravating factors: Standing and walking  Relieving factors: rest   PRECAUTIONS: None  RED FLAGS: None   WEIGHT BEARING RESTRICTIONS: No  FALLS:  Has patient fallen in last 6 months? No  LIVING  ENVIRONMENT: Nothing pertinent   OCCUPATION:  Retired   Presenter, broadcasting:  Yard work   PLOF: Independent  PATIENT GOALS:  To have less pain To get stronger    NEXT MD VISIT:  Nothing scheduled.   OBJECTIVE:  Note: Objective measures were completed at Evaluation unless otherwise noted.  DIAGNOSTIC FINDINGS:  MRI 2022   IMPRESSION: 1. Multilevel spondylosis of the lumbar spine as described. 2. Mild foraminal narrowing bilaterally at L1-2 and L2-3 is worse on the left. Slight retrolisthesis and uncovering of a broad-based disc protrusion contribute at both levels. 3. Mild central and bilateral foraminal narrowing at L3-4 secondary to a broad-based disc protrusion and facet hypertrophy. 4. Mild central and mild to moderate foraminal narrowing bilaterally at L4-5 is worse right than left. 5. Moderate left and mild right foraminal stenosis at L5-S1 due to endplate and facet spurring. 6. Cholelithiasis without evidence for cholecystitis.  PATIENT SURVEYS:  LEFS 49/80  COGNITION: Overall cognitive status: Within functional limits for tasks assessed     SENSATION: WFL    POSTURE: rounded shoulders, forward head, and flexed trunk   PALPATION: Significant spasming in left paraspinals and gluteal   LUMBAR ROM:   AROM eval  Flexion Limited 25% with pain   Extension Painful past neutral   Right lateral flexion   Left lateral flexion   Right rotation   Left rotation    (Blank rows = not tested)  LOWER EXTREMITY ROM:  LOWER EXTREMITY MMT:    MMT Right eval Left eval  Hip flexion 17.5 16.3  Hip extension    Hip abduction 28.7 26.3  Hip adduction    Hip internal rotation    Hip external rotation    Knee flexion    Knee extension 34.3 32.1  Ankle dorsiflexion    Ankle plantarflexion    Ankle inversion    Ankle eversion     (Blank rows = not tested)    GAIT: Flexed posture   TREATMENT DATE:  5/28 Initial Treatment: Pt instructed on Dry Needling  rational, procedures, and possible side effects. Pt instructed to expect mild to moderate muscle soreness later in the day and/or into the next day.  Pt instructed in methods to reduce muscle soreness. Pt instructed to continue prescribed HEP. Because Dry Needling was performed over or adjacent to a lung field, pt was educated on S/S of pneumothorax and to seek immediate medical attention should they occur.  Patient was educated on signs and symptoms of infection and other risk factors and advised to seek medical attention should they occur.  Patient verbalized understanding of these instructions and education.      Manual:  Trigger point therapy and STM to lumbar paraspinals and L QL region Thoracic and lumbar PA's grade 2 Skilled palpation of trigger points  Nuero-re-ed:  Bilateral shld flexion with weight 3x10 1 lb  Bilateral bicep curl 3lb 3x12  Punches 2lbs 3x10 each arm   All with cuing for posture in sitting.   5/23 Manual:  Trigger point therapy and STM to lumbar paraspinals and L QL region Thoracic and lumbar PA's grade 2 Skilled palpation of trigger points  Neuro-re-ed:  Supine march 3x10 with ab bracing Bilateral shld flexion with weight 3x10 1 lb   Row with cueing for posture 3 x 15 green           Shoulder extension 3 x 15 green cueing for posture 5/19 Initial Treatment: Pt instructed on Dry Needling rational, procedures, and possible side effects. Pt instructed to expect mild to moderate muscle soreness later in the day and/or into the next day.  Pt instructed in methods to reduce muscle soreness. Pt instructed to continue prescribed HEP. Because Dry Needling was performed over or adjacent to a lung field, pt was educated on S/S of pneumothorax and to seek immediate medical attention should they occur.  Patient was educated on signs and symptoms of infection and other risk factors and advised to seek medical attention should they occur.  Patient verbalized  understanding of these instructions and education.   Patient Verbal Consent Given: Yes Education Handout Provided: Yes Muscles Treated: .30x5o needle 3 spots in t-8-t-12 and  Electrical Stimulation Performed: No Treatment Response/Outcome: great twitch    Manual:  Trigger point therapy and STM to lumbar paraspinals and L QL region Thoracic and lumbar PA's grade 2 Skilled palpation of trigger points   Neuro-re-ed:  Supine march 3x10 with ab bracing Supine hip abduction 3x10 green  Bilateral shld flexion with weight 3x10 1 lb  PATIENT EDUCATION:  Education details: HEP, symptom management  Person educated: Patient Education method: Explanation, Demonstration, Tactile cues, Verbal cues, and Handouts Education comprehension: verbalized understanding, returned demonstration, verbal cues required, tactile cues required, and needs further education  HOME EXERCISE PROGRAM: Access Code: [email or web address] URL: [email or web address] Date: 07/05/2023 Prepared by: Signa Drier  Exercises - Standing Glute Med Mobilization with Small Ball on Wall  - 1 x daily - 7 x weekly - 3 sets - 10 reps - Low Horizontal Abduction with Resistance  - 1 x daily - 7 x weekly - 3 sets - 10 reps - Shoulder External Rotation and Scapular Retraction with Resistance  - 1 x daily - 7 x weekly - 3 sets - 10 reps  ASSESSMENT:  CLINICAL IMPRESSION: The patient had an attack of pain following manual therapy in his stomach. He report this has been happening laterally. He does not think it is related to position r activity. It went away quickly. He will see his GI MD tomorrow. We planned on moving him into the gym today but instead kept him with light neuro re-ed with the dumbbell. We will progress per GI doc recommendations. We will perform re-assessment next visit.    Patient is a 73 year old male who presents with left-sided low back pain.  Earlier this month he had an acute onset of right-sided low back pain as well.  He had a course of prednisone which resolved this.  At this time he feels like most of his pain is back to being focused on the left side.  Has had success with physical therapy before.  He is not currently doing any of the exercises he was doing before.  He has increased pain with activity.  He has limited lumbar motion as well as limited bilateral lower extremity strength.  He has a long complicated history of abdominal surgeries.  He currently has mesh that causes him pain with activity.  He would benefit from skilled therapy to improve core strength and stability, reduce pain in lower back, and reviewed total program future exacerbations of pain.   OBJECTIVE IMPAIRMENTS: Abnormal gait, decreased activity tolerance, decreased endurance, decreased ROM, decreased strength, improper body mechanics, and postural dysfunction.   ACTIVITY LIMITATIONS: carrying, lifting, bending, standing, squatting, sleeping, stairs, and transfers  PARTICIPATION LIMITATIONS: meal prep, cleaning, laundry, shopping, community activity, occupation, and yard work  PERSONAL FACTORS: 1-2 comorbidities: Multiple abdominal surgeries, history of kidney stones are also affecting patient's functional outcome.   REHAB POTENTIAL: Good  CLINICAL DECISION MAKING: Evolving/moderate complexity  EVALUATION COMPLEXITY: Moderate   GOALS: Goals reviewed with patient? Yes SHORT TERM GOALS: Target date: 08/02/2023     Patient will increase lumbar flexion to full without pain Baseline: Goal status: INITIAL   2.  Patient will increase gross bilateral lower extremity strength by 5 pounds Baseline:  Goal status: INITIAL   3.  Patient will be independent and complaint with basic HEP  Baseline:  Goal status: INITIAL     LONG TERM GOALS: Target date: 08/30/2023     Patient  will return to using the weed wacker without pain  Baseline:  Goal status: INITIAL   2.  Patient will reach overhead to a shelf without pain  Baseline:  Goal status: INITIAL   3.  Patient will show compliance to long term program for strengthening  Baseline:  Goal status: INITIAL  PLAN:  PT FREQUENCY: 2x/week  PT DURATION: 8 weeks  PLANNED INTERVENTIONS: 97110-Therapeutic exercises, 97530- Therapeutic activity, [id number]- Neuromuscular re-education, 97535-  Self Care, [id number]- Manual therapy, [id number]- Gait training, [id number]- Aquatic Therapy, [phone]- Electrical stimulation (unattended), [phone]- Ultrasound, Patient/Family education, Stair training, Taping, Dry Needling, DME instructions, Cryotherapy, and Moist heat .  PLAN FOR NEXT SESSION: Consider trigger point dry needling of lumbar spine and gluteals.  Soft tissue mobilization to lumbar spine.  Consider LAD. Continue to expand home exercises and work on getting the patient to buy into home exercises.    Kitty Perkins, PT 08/24/2023, 9:08 AM   I have reviewed and concur with this student's documentation.    During this treatment session, the therapist was present, participating in and directing the treatment.

## 2023-08-24 ENCOUNTER — Encounter (HOSPITAL_BASED_OUTPATIENT_CLINIC_OR_DEPARTMENT_OTHER): Payer: Self-pay | Admitting: Physical Therapy

## 2023-08-25 ENCOUNTER — Ambulatory Visit (HOSPITAL_BASED_OUTPATIENT_CLINIC_OR_DEPARTMENT_OTHER)

## 2023-08-25 DIAGNOSIS — K8020 Calculus of gallbladder without cholecystitis without obstruction: Secondary | ICD-10-CM | POA: Diagnosis not present

## 2023-08-25 DIAGNOSIS — K5900 Constipation, unspecified: Secondary | ICD-10-CM | POA: Diagnosis not present

## 2023-08-25 DIAGNOSIS — K219 Gastro-esophageal reflux disease without esophagitis: Secondary | ICD-10-CM | POA: Diagnosis not present

## 2023-08-25 DIAGNOSIS — Z1211 Encounter for screening for malignant neoplasm of colon: Secondary | ICD-10-CM | POA: Diagnosis not present

## 2023-08-25 DIAGNOSIS — R109 Unspecified abdominal pain: Secondary | ICD-10-CM | POA: Diagnosis not present

## 2023-08-26 ENCOUNTER — Other Ambulatory Visit: Payer: Self-pay

## 2023-08-26 DIAGNOSIS — K8020 Calculus of gallbladder without cholecystitis without obstruction: Secondary | ICD-10-CM

## 2023-08-26 DIAGNOSIS — R748 Abnormal levels of other serum enzymes: Secondary | ICD-10-CM

## 2023-08-29 ENCOUNTER — Ambulatory Visit (HOSPITAL_BASED_OUTPATIENT_CLINIC_OR_DEPARTMENT_OTHER): Attending: Family Medicine | Admitting: Physical Therapy

## 2023-08-29 DIAGNOSIS — M546 Pain in thoracic spine: Secondary | ICD-10-CM | POA: Insufficient documentation

## 2023-08-29 DIAGNOSIS — R293 Abnormal posture: Secondary | ICD-10-CM | POA: Diagnosis not present

## 2023-08-29 DIAGNOSIS — M5459 Other low back pain: Secondary | ICD-10-CM | POA: Insufficient documentation

## 2023-08-29 NOTE — Therapy (Addendum)
 OUTPATIENT PHYSICAL THERAPY THORACOLUMBAR Progress Note    Patient Name: Miguel [name] MRN: [id number] DOB:02-17-1951, 73 y.o., male Today's Date: 08/29/2023  END OF SESSION:  PT End of Session - 08/29/23 1136     Visit Number 10    Number of Visits 16    Date for PT Re-Evaluation 08/31/23    PT Start Time 1105   therapy late   PT Stop Time 1149    PT Time Calculation (min) 44 min    Activity Tolerance Patient tolerated treatment well;No increased pain    Behavior During Therapy [facility] for tasks assessed/performed              Progress Note Reporting Period 07/05/2023 to 08/29/2023  See note below for Objective Data and Assessment of Progress/Goals.         Past Medical History:  Diagnosis Date   Anemia    Anxiety    BPH (benign prostatic hyperplasia)    Gastritis    GERD (gastroesophageal reflux disease)    seldom (07/16/2016)   GI bleed due to NSAIDs 10/27/2015   History of blood transfusion 06/2016   post OR/notes 07/15/2016   History of hiatal hernia    History of kidney stones    surgery to remove stone   Hyperlipidemia    Hypertension    no meds    Melanoma of back (HCC)    mid back   Sigmoid diverticulitis    with perforation   Past Surgical History:  Procedure Laterality Date   COLON SURGERY     sigmoid   COLOSTOMY TAKEDOWN N/A 06/28/2016   Procedure: COLOSTOMY TAKEDOWN;  Surgeon: Oza Blumenthal, MD;  Location: [facility] OR;  Service: General;  Laterality: N/A;   CYSTOSCOPY WITH RETROGRADE PYELOGRAM, URETEROSCOPY AND STENT PLACEMENT Bilateral 02/12/2020   Procedure: CYSTOSCOPY WITH BILATERAL RETROGRADE PYELOGRAM,  AND LITHOPEXY;  Surgeon: Sherlyn Ditto, MD;  Location: [facility] ORS;  Service: Urology;  Laterality: Bilateral;  1 HR   ESOPHAGOGASTRODUODENOSCOPY N/A 10/27/2015   Procedure: ESOPHAGOGASTRODUODENOSCOPY (EGD);  Surgeon: Ozell Blunt, MD;  Location: [facility] ENDOSCOPY;  Service: Endoscopy;  Laterality: N/A;   ESOPHAGOGASTRODUODENOSCOPY N/A 10/29/2015    Procedure: ESOPHAGOGASTRODUODENOSCOPY (EGD);  Surgeon: Lanita Pitman, MD;  Location: [facility] ENDOSCOPY;  Service: Endoscopy;  Laterality: N/A;   ESOPHAGOGASTRODUODENOSCOPY N/A 10/30/2015   Procedure: ESOPHAGOGASTRODUODENOSCOPY (EGD);  Surgeon: Ozell Blunt, MD;  Location: [facility] ENDOSCOPY;  Service: Endoscopy;  Laterality: N/A;   g tube discontinued  04/2020   per patient   GASTROSTOMY TUBE PLACEMENT  11/21/2015   REDUCTION OF HIATAL HERNIA , REPAIR HIATAL HERNIA, RESECTION SMALL BOWEL WITH ANASTOMOSIS, PLACEMENT GASTROSTOMY TUBE, PLACEMENT DUODENOSTOMY TUBE (N/A)   HEMORRHOID BANDING  X 2   HERNIA REPAIR     HIATAL HERNIA REPAIR N/A 11/21/2015   Procedure: REDUCTION OF HIATAL HERNIA , REPAIR HIATAL HERNIA, RESECTION SMALL BOWEL WITH ANASTOMOSIS, PLACEMENT GASTROSTOMY TUBE, PLACEMENT DUODENOSTOMY TUBE;  Surgeon: Derral Flick, MD;  Location: [facility] OR;  Service: General;  Laterality: N/A;   HOLMIUM LASER APPLICATION Right 02/12/2020   Procedure: HOLMIUM LASER APPLICATION;  Surgeon: Sherlyn Ditto, MD;  Location: [facility];  Service: Urology;  Laterality: Right;   INCISIONAL HERNIA REPAIR  06/28/2016   open/notes 07/15/2016   INCISIONAL HERNIA REPAIR  03/17/2018   WITH MESH   INCISIONAL HERNIA REPAIR N/A 03/17/2018   Procedure: INCISIONAL HERNIA REPAIR WITH MESH;  Surgeon: Oza Blumenthal, MD;  Location: [facility] OR;  Service: General;  Laterality: N/A;   INCISIONAL HERNIA REPAIR N/A 10/16/2020  Procedure: INCISIONAL HERNIA REPAIR WITH MESH;  Surgeon: Oza Blumenthal, MD;  Location: [facility] OR;  Service: General;  Laterality: N/A;   INGUINAL HERNIA REPAIR Bilateral 09/28/2018   INGUINAL HERNIA REPAIR Bilateral 09/28/2018   Procedure: BILATERAL OPEN INGUINAL HERNIA REPAIR WITH MESH;  Surgeon: Oza Blumenthal, MD;  Location: [facility] OR;  Service: General;  Laterality: Bilateral;  GENERAL AND TAP BLOCK   INSERTION OF MESH N/A 03/17/2018   Procedure: INSERTION OF MESH;  Surgeon: Oza Blumenthal, MD;  Location: [facility] OR;   Service: General;  Laterality: N/A;   INSERTION OF MESH Bilateral 09/28/2018   Procedure: Insertion Of Mesh;  Surgeon: Oza Blumenthal, MD;  Location: [facility] OR;  Service: General;  Laterality: Bilateral;   IR CM INJ ANY COLONIC TUBE W/FLUORO  02/04/2017   IR GUIDED DRAIN W CATHETER PLACEMENT  07/06/2016   /NOTES 07/15/2016   IR PATIENT EVAL TECH 0-60 MINS  06/28/2019   IR RADIOLOGIST EVAL & MGMT  07/27/2016   IR RADIOLOGIST EVAL & MGMT  08/17/2016   IR RADIOLOGIST EVAL & MGMT  08/26/2016   IR REPLACE G-TUBE SIMPLE WO FLUORO  07/28/2017   IR REPLACE G-TUBE SIMPLE WO FLUORO  01/31/2018   IR REPLACE G-TUBE SIMPLE WO FLUORO  10/16/2018   IR REPLACE G-TUBE SIMPLE WO FLUORO  03/05/2019   IR REPLACE G-TUBE SIMPLE WO FLUORO  08/22/2019   IR REPLACE G-TUBE SIMPLE WO FLUORO  01/08/2020   IR REPLC GASTRO/COLONIC TUBE PERCUT W/FLUORO  08/04/2016   IR REPLC GASTRO/COLONIC TUBE PERCUT W/FLUORO  01/14/2017   IR US  GUIDE BX ASP/DRAIN  08/04/2016   KNEE CARTILAGE SURGERY Right 1971   opened me up   LAPAROTOMY N/A 07/05/2015   Procedure: PARTIAL SIGMOID COLECTOMY AND COLOSTOMY;  Surgeon: Oza Blumenthal, MD;  Location: [facility] OR;  Service: General;  Laterality: N/A;   MELANOMA EXCISION  2001   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  10/30/2015   Procedure: REMOVAL OF DISTAL STOMACH;  Surgeon: Jerryl Morin, MD;  Location: [facility] OR;  Service: General;;   REPAIR OF PERFORATED ULCER N/A 10/30/2015   Procedure: REPAIR OF BLEEDING  ULCER;  Surgeon: Jerryl Morin, MD;  Location: [facility] OR;  Service: General;  Laterality: N/A;   TUMOR EXCISION  2009   back; fatty tumor   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   Bilateral inguinal hernia 09/28/2018   Incisional hernia 03/17/2018   Trigger thumb, left thumb 01/19/2018   Trigger finger, left index finger 07/18/2017   Trigger finger, right ring finger 01/31/2017   Trigger finger  of left thumb 01/03/2017   Trigger finger of right thumb 01/03/2017   Trigger index finger of left hand 01/03/2017   Trigger index finger of right hand 01/03/2017   Malnutrition of moderate degree 07/19/2016   Intra-abdominal abscess (HCC) 07/16/2016   Cellulitis 07/15/2016   S/P colostomy takedown 06/28/2016   Pain in thoracic spine 06/09/2016   Mid back pain 06/09/2016   Postoperative fever 11/19/2015   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   Severe protein-calorie malnutrition (HCC) 11/17/2015   Sepsis (HCC) 11/14/2015   Hiccups 11/14/2015   AKI (acute kidney injury) (HCC) 11/14/2015   Fever    Leg swelling    Left shoulder pain    Muscle spasm of left shoulder    GI bleed 10/27/2015   Acute blood loss anemia 10/27/2015   Syncope 10/27/2015   Hyperglycemia  10/27/2015   Hypotension 10/27/2015   Neck pain 10/27/2015   Hematemesis 10/27/2015   Hematochezia 10/27/2015   Diverticulitis of colon with perforation 07/05/2015    PCP: Terris Fickle MD   REFERRING PROVIDER: Benedetto Brady MD   REFERRING DIAG:  DiagnosisM54.50 (ICD-10-CM) - Low back pain, unspecified  Rationale for Evaluation and Treatment: Rehabilitation  THERAPY DIAG:  Other low back pain  Pain in thoracic spine  Abnormal posture  ONSET DATE: 1 month prior   SUBJECTIVE:                                                                                                                                                                                           SUBJECTIVE STATEMENT: The patient had another attack of pain this morning. It has resolved at this time. He still feels some residual soreness. His back has otherwise been doing well.    PERTINENT HISTORY:  Anxiety, anemia, kidney stones,  PAIN:  Are you having pain? Yes: NPRS scale: 3/10 Pain location:left abdomen  Pain description: aching/constant  Aggravating factors: Standing and walking  Relieving factors: rest   PRECAUTIONS: None  RED  FLAGS: None   WEIGHT BEARING RESTRICTIONS: No  FALLS:  Has patient fallen in last 6 months? No  LIVING ENVIRONMENT: Nothing pertinent   OCCUPATION:  Retired   Presenter, broadcasting:  Yard work   PLOF: Independent  PATIENT GOALS:  To have less pain To get stronger    NEXT MD VISIT:  Nothing scheduled.   OBJECTIVE:  Note: Objective measures were completed at Evaluation unless otherwise noted.  DIAGNOSTIC FINDINGS:  MRI 2022   IMPRESSION: 1. Multilevel spondylosis of the lumbar spine as described. 2. Mild foraminal narrowing bilaterally at L1-2 and L2-3 is worse on the left. Slight retrolisthesis and uncovering of a broad-based disc protrusion contribute at both levels. 3. Mild central and bilateral foraminal narrowing at L3-4 secondary to a broad-based disc protrusion and facet hypertrophy. 4. Mild central and mild to moderate foraminal narrowing bilaterally at L4-5 is worse right than left. 5. Moderate left and mild right foraminal stenosis at L5-S1 due to endplate and facet spurring. 6. Cholelithiasis without evidence for cholecystitis.  PATIENT SURVEYS:  LEFS 49/80  COGNITION: Overall cognitive status: Within functional limits for tasks assessed     SENSATION: WFL    POSTURE: rounded shoulders, forward head, and flexed trunk   PALPATION: Significant spasming in left paraspinals and gluteal   LUMBAR ROM:   AROM eval 6/2   Flexion Limited 25% with pain    Extension Painful past neutral    Right lateral flexion    Left lateral flexion  Right rotation    Left rotation     (Blank rows = not tested)  LOWER EXTREMITY ROM:     LOWER EXTREMITY MMT:    MMT Right eval Left eval Right 6/2  Left   Hip flexion 17.5 16.3 34.1 33.7  Hip extension      Hip abduction 28.7 26.3 37.9 33.0  Hip adduction      Hip internal rotation      Hip external rotation      Knee flexion      Knee extension 34.3 32.1 35.1 45.1  Ankle dorsiflexion      Ankle  plantarflexion      Ankle inversion      Ankle eversion       (Blank rows = not tested)    GAIT: Flexed posture   6/2 Improved posture with gait without cuing    TREATMENT DATE:  6/2 There-ex: Reviewed strength measurements/ hand held dyno testing  Nu-step 5 min  LF triceps press down 3x10 40 lbs  LF row 3x10 40 lbs   Neuro re-ed:  Cable:  Row 3x10 15 lbs  Cable extensions 3x10 15 lbs           Manual:  Trigger point therapy and STM to lumbar paraspinals and L QL region Thoracic and lumbar PA's grade 2 Skilled palpation of trigger points 5/28 Initial Treatment: Pt instructed on Dry Needling rational, procedures, and possible side effects. Pt instructed to expect mild to moderate muscle soreness later in the day and/or into the next day.  Pt instructed in methods to reduce muscle soreness. Pt instructed to continue prescribed HEP. Because Dry Needling was performed over or adjacent to a lung field, pt was educated on S/S of pneumothorax and to seek immediate medical attention should they occur.  Patient was educated on signs and symptoms of infection and other risk factors and advised to seek medical attention should they occur.  Patient verbalized understanding of these instructions and education.      Manual:  Trigger point therapy and STM to lumbar paraspinals and L QL region Thoracic and lumbar PA's grade 2 Skilled palpation of trigger points  Nuero-re-ed:  Bilateral shld flexion with weight 3x10 1 lb  Bilateral bicep curl 3lb 3x12  Punches 2lbs 3x10 each arm   All with cuing for posture in sitting.   5/23 Manual:  Trigger point therapy and STM to lumbar paraspinals and L QL region Thoracic and lumbar PA's grade 2 Skilled palpation of trigger points  Neuro-re-ed:  Supine march 3x10 with ab bracing Bilateral shld flexion with weight 3x10 1 lb   Row with cueing for posture 3 x 15 green           Shoulder extension 3 x 15 green cueing for  posture 5/19 Initial Treatment: Pt instructed on Dry Needling rational, procedures, and possible side effects. Pt instructed to expect mild to moderate muscle soreness later in the day and/or into the next day.  Pt instructed in methods to reduce muscle soreness. Pt instructed to continue prescribed HEP. Because Dry Needling was performed over or adjacent to a lung field, pt was educated on S/S of pneumothorax and to seek immediate medical attention should they occur.  Patient was educated on signs and symptoms of infection and other risk factors and advised to seek medical attention should they occur.  Patient verbalized understanding of these instructions and education.   Patient Verbal Consent Given: Yes Education Handout Provided: Yes Muscles Treated: .30x5o  needle 3 spots in t-8-t-12 and  Electrical Stimulation Performed: No Treatment Response/Outcome: great twitch    Manual:  Trigger point therapy and STM to lumbar paraspinals and L QL region Thoracic and lumbar PA's grade 2 Skilled palpation of trigger points   Neuro-re-ed:  Supine march 3x10 with ab bracing Supine hip abduction 3x10 green  Bilateral shld flexion with weight 3x10 1 lb                                                                                                                                         PATIENT EDUCATION:  Education details: HEP, symptom management  Person educated: Patient Education method: Explanation, Demonstration, Tactile cues, Verbal cues, and Handouts Education comprehension: verbalized understanding, returned demonstration, verbal cues required, tactile cues required, and needs further education  HOME EXERCISE PROGRAM: Access Code: [email or web address] URL: [email or web address] Date: 07/05/2023 Prepared by: Signa Drier  Exercises - Standing Glute Med Mobilization with Small Ball on Wall  - 1 x daily - 7 x weekly - 3 sets - 10 reps - Low Horizontal Abduction with  Resistance  - 1 x daily - 7 x weekly - 3 sets - 10 reps - Shoulder External Rotation and Scapular Retraction with Resistance  - 1 x daily - 7 x weekly - 3 sets - 10 reps  ASSESSMENT:  CLINICAL IMPRESSION: Overall the patients back is improving. He continues to have trigger points but they are smaller and less sensitive. He continues to be somewhat limited by stomach symptoms. He will have a scan done next week to see if anything can be done. We continues to work on manual therapy to reduce spasming in increase spinal mobility and posterior chain strengthening. He would benefit from continued skilled therapy 2W8 to build into a home strengthening and posture program. We were able to add in gym activity today for posterior chain strengthening.    Patient is a 73 year old male who presents with left-sided low back pain.  Earlier this month he had an acute onset of right-sided low back pain as well.  He had a course of prednisone which resolved this.  At this time he feels like most of his pain is back to being focused on the left side.  Has had success with physical therapy before.  He is not currently doing any of the exercises he was doing before.  He has increased pain with activity.  He has limited lumbar motion as well as limited bilateral lower extremity strength.  He has a long complicated history of abdominal surgeries.  He currently has mesh that causes him pain with activity.  He would benefit from skilled therapy to improve core strength and stability, reduce pain in lower back, and reviewed total program future exacerbations of pain.   OBJECTIVE IMPAIRMENTS: Abnormal gait, decreased activity tolerance, decreased endurance, decreased ROM, decreased strength, improper body mechanics, and postural dysfunction.   ACTIVITY  LIMITATIONS: carrying, lifting, bending, standing, squatting, sleeping, stairs, and transfers  PARTICIPATION LIMITATIONS: meal prep, cleaning, laundry, shopping, community  activity, occupation, and yard work  PERSONAL FACTORS: 1-2 comorbidities: Multiple abdominal surgeries, history of kidney stones are also affecting patient's functional outcome.   REHAB POTENTIAL: Good  CLINICAL DECISION MAKING: Evolving/moderate complexity  EVALUATION COMPLEXITY: Moderate   GOALS: Goals reviewed with patient? Yes SHORT TERM GOALS: Target date: 08/02/2023     Patient will increase lumbar flexion to full without pain Baseline: Goal status:  mild pain at end rage 6/3   2.  Patient will increase gross bilateral lower extremity strength by 5 pounds Baseline:  Goal status: achieved in most muscle groups 6/3   3.  Patient will be independent and complaint with basic HEP  Baseline:  Goal status: independent      LONG TERM GOALS: Target date: 08/30/2023     Patient will return to using the weed wacker without pain  Baseline:  Goal status: improving 6/3    2.  Patient will reach overhead to a shelf without pain  Baseline:  Goal status: improving 6/3   3.  Patient will show compliance to long term program for strengthening  Baseline:  Goal status: ongoing 6/3   PLAN:  PT FREQUENCY: 2x/week  PT DURATION: 8 weeks  PLANNED INTERVENTIONS: 97110-Therapeutic exercises, 97530- Therapeutic activity, [id number]- Neuromuscular re-education, 97535- Self Care, [id number]- Manual therapy, [id number]- Gait training, [phone]- Aquatic Therapy, [id number]- Electrical stimulation (unattended), 97035- Ultrasound, Patient/Family education, Stair training, Taping, Dry Needling, DME instructions, Cryotherapy, and Moist heat .  PLAN FOR NEXT SESSION: Consider trigger point dry needling of lumbar spine and gluteals.  Soft tissue mobilization to lumbar spine.  Consider LAD. Continue to expand home exercises and work on getting the patient to buy into home exercises.    Kitty Perkins, PT 08/29/2023, 11:37 AM   I have reviewed and concur with this student's documentation.    During this treatment  session, the therapist was present, participating in and directing the treatment.

## 2023-08-30 ENCOUNTER — Ambulatory Visit
Admission: RE | Admit: 2023-08-30 | Discharge: 2023-08-30 | Disposition: A | Discharge status: Home / Self Care | Source: Ambulatory Visit

## 2023-08-30 ENCOUNTER — Encounter (HOSPITAL_BASED_OUTPATIENT_CLINIC_OR_DEPARTMENT_OTHER): Payer: Self-pay | Admitting: Physical Therapy

## 2023-08-30 DIAGNOSIS — K828 Other specified diseases of gallbladder: Secondary | ICD-10-CM | POA: Diagnosis not present

## 2023-08-30 DIAGNOSIS — K8020 Calculus of gallbladder without cholecystitis without obstruction: Secondary | ICD-10-CM

## 2023-08-30 DIAGNOSIS — R748 Abnormal levels of other serum enzymes: Secondary | ICD-10-CM

## 2023-09-01 ENCOUNTER — Ambulatory Visit (HOSPITAL_BASED_OUTPATIENT_CLINIC_OR_DEPARTMENT_OTHER): Admitting: Physical Therapy

## 2023-09-01 DIAGNOSIS — M546 Pain in thoracic spine: Secondary | ICD-10-CM

## 2023-09-01 DIAGNOSIS — R293 Abnormal posture: Secondary | ICD-10-CM | POA: Diagnosis not present

## 2023-09-01 DIAGNOSIS — M5459 Other low back pain: Secondary | ICD-10-CM | POA: Diagnosis not present

## 2023-09-02 ENCOUNTER — Encounter (HOSPITAL_BASED_OUTPATIENT_CLINIC_OR_DEPARTMENT_OTHER): Payer: Self-pay | Admitting: Physical Therapy

## 2023-09-02 DIAGNOSIS — Z1212 Encounter for screening for malignant neoplasm of rectum: Secondary | ICD-10-CM | POA: Diagnosis not present

## 2023-09-02 DIAGNOSIS — Z1211 Encounter for screening for malignant neoplasm of colon: Secondary | ICD-10-CM | POA: Diagnosis not present

## 2023-09-02 NOTE — Therapy (Signed)
 OUTPATIENT PHYSICAL THERAPY THORACOLUMBAR Progress Note    Patient Name: Miguel [name] MRN: [id number] DOB:Aug 08, 1950, 73 y.o., male Today's Date: 09/02/2023  END OF SESSION:  PT End of Session - 09/02/23 0717     Visit Number 11    Number of Visits 26    Date for PT Re-Evaluation 10/25/23    PT Start Time 1100    PT Stop Time 1143    PT Time Calculation (min) 43 min    Activity Tolerance Patient tolerated treatment well;No increased pain    Behavior During Therapy WFL for tasks assessed/performed                   Past Medical History:  Diagnosis Date   Anemia    Anxiety    BPH (benign prostatic hyperplasia)    Gastritis    GERD (gastroesophageal reflux disease)    "seldom" (07/16/2016)   GI bleed due to NSAIDs 10/27/2015   History of blood transfusion 06/2016   post OR/notes 07/15/2016   History of hiatal hernia    History of kidney stones    surgery to remove stone   Hyperlipidemia    Hypertension    no meds    Melanoma of back (HCC)    "mid back"   Sigmoid diverticulitis    with perforation   Past Surgical History:  Procedure Laterality Date   COLON SURGERY     sigmoid   COLOSTOMY TAKEDOWN N/A 06/28/2016   Procedure: COLOSTOMY TAKEDOWN;  Surgeon: Oza Blumenthal, MD;  Location: [facility] OR;  Service: General;  Laterality: N/A;   CYSTOSCOPY WITH RETROGRADE PYELOGRAM, URETEROSCOPY AND STENT PLACEMENT Bilateral 02/12/2020   Procedure: CYSTOSCOPY WITH BILATERAL RETROGRADE PYELOGRAM,  AND LITHOPEXY;  Surgeon: Sherlyn Ditto, MD;  Location: [facility];  Service: Urology;  Laterality: Bilateral;  1 HR   ESOPHAGOGASTRODUODENOSCOPY N/A 10/27/2015   Procedure: ESOPHAGOGASTRODUODENOSCOPY (EGD);  Surgeon: Ozell Blunt, MD;  Location: [facility] ENDOSCOPY;  Service: Endoscopy;  Laterality: N/A;   ESOPHAGOGASTRODUODENOSCOPY N/A 10/29/2015   Procedure: ESOPHAGOGASTRODUODENOSCOPY (EGD);  Surgeon: Lanita Pitman, MD;  Location: [facility] ENDOSCOPY;  Service: Endoscopy;  Laterality: N/A;    ESOPHAGOGASTRODUODENOSCOPY N/A 10/30/2015   Procedure: ESOPHAGOGASTRODUODENOSCOPY (EGD);  Surgeon: Ozell Blunt, MD;  Location: [facility] ENDOSCOPY;  Service: Endoscopy;  Laterality: N/A;   g tube discontinued  04/2020   per patient   GASTROSTOMY TUBE PLACEMENT  11/21/2015   REDUCTION OF HIATAL HERNIA , REPAIR HIATAL HERNIA, RESECTION SMALL BOWEL WITH ANASTOMOSIS, PLACEMENT GASTROSTOMY TUBE, PLACEMENT DUODENOSTOMY TUBE (N/A)   HEMORRHOID BANDING  X 2   HERNIA REPAIR     HIATAL HERNIA REPAIR N/A 11/21/2015   Procedure: REDUCTION OF HIATAL HERNIA , REPAIR HIATAL HERNIA, RESECTION SMALL BOWEL WITH ANASTOMOSIS, PLACEMENT GASTROSTOMY TUBE, PLACEMENT DUODENOSTOMY TUBE;  Surgeon: Derral Flick, MD;  Location: [facility] OR;  Service: General;  Laterality: N/A;   HOLMIUM LASER APPLICATION Right 02/12/2020   Procedure: HOLMIUM LASER APPLICATION;  Surgeon: Sherlyn Ditto, MD;  Location: [facility];  Service: Urology;  Laterality: Right;   INCISIONAL HERNIA REPAIR  06/28/2016   open/notes 07/15/2016   INCISIONAL HERNIA REPAIR  03/17/2018   WITH MESH   INCISIONAL HERNIA REPAIR N/A 03/17/2018   Procedure: INCISIONAL HERNIA REPAIR WITH MESH;  Surgeon: Oza Blumenthal, MD;  Location: [facility] OR;  Service: General;  Laterality: N/A;   INCISIONAL HERNIA REPAIR N/A 10/16/2020   Procedure: Estrella Hench HERNIA REPAIR WITH MESH;  Surgeon: Oza Blumenthal, MD;  Location: [facility] OR;  Service: General;  Laterality: N/A;  INGUINAL HERNIA REPAIR Bilateral 09/28/2018   INGUINAL HERNIA REPAIR Bilateral 09/28/2018   Procedure: BILATERAL OPEN INGUINAL HERNIA REPAIR WITH MESH;  Surgeon: Oza Blumenthal, MD;  Location: [facility] OR;  Service: General;  Laterality: Bilateral;  GENERAL AND TAP BLOCK   INSERTION OF MESH N/A 03/17/2018   Procedure: INSERTION OF MESH;  Surgeon: Oza Blumenthal, MD;  Location: [facility] OR;  Service: General;  Laterality: N/A;   INSERTION OF MESH Bilateral 09/28/2018   Procedure: Insertion Of Mesh;  Surgeon: Oza Blumenthal,  MD;  Location: [facility] OR;  Service: General;  Laterality: Bilateral;   IR CM INJ ANY COLONIC TUBE W/FLUORO  02/04/2017   IR GUIDED DRAIN W CATHETER PLACEMENT  07/06/2016   /NOTES 07/15/2016   IR PATIENT EVAL TECH 0-60 MINS  06/28/2019   IR RADIOLOGIST EVAL & MGMT  07/27/2016   IR RADIOLOGIST EVAL & MGMT  08/17/2016   IR RADIOLOGIST EVAL & MGMT  08/26/2016   IR REPLACE G-TUBE SIMPLE WO FLUORO  07/28/2017   IR REPLACE G-TUBE SIMPLE WO FLUORO  01/31/2018   IR REPLACE G-TUBE SIMPLE WO FLUORO  10/16/2018   IR REPLACE G-TUBE SIMPLE WO FLUORO  03/05/2019   IR REPLACE G-TUBE SIMPLE WO FLUORO  08/22/2019   IR REPLACE G-TUBE SIMPLE WO FLUORO  01/08/2020   IR REPLC GASTRO/COLONIC TUBE PERCUT W/FLUORO  08/04/2016   IR REPLC GASTRO/COLONIC TUBE PERCUT W/FLUORO  01/14/2017   IR US  GUIDE BX ASP/DRAIN  08/04/2016   KNEE CARTILAGE SURGERY Right 1971   "opened me up"   LAPAROTOMY N/A 07/05/2015   Procedure: PARTIAL SIGMOID COLECTOMY AND COLOSTOMY;  Surgeon: Oza Blumenthal, MD;  Location: [facility] OR;  Service: General;  Laterality: N/A;   MELANOMA EXCISION  2001   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  10/30/2015   Procedure: REMOVAL OF DISTAL STOMACH;  Surgeon: Jerryl Morin, MD;  Location: [facility] OR;  Service: General;;   REPAIR OF PERFORATED ULCER N/A 10/30/2015   Procedure: REPAIR OF BLEEDING  ULCER;  Surgeon: Jerryl Morin, MD;  Location: [facility] OR;  Service: General;  Laterality: N/A;   TUMOR EXCISION  2009   "back; fatty tumor"   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   Bilateral inguinal hernia 09/28/2018   Incisional hernia 03/17/2018   Trigger thumb, left thumb 01/19/2018   Trigger finger, left index finger 07/18/2017   Trigger finger, right ring finger 01/31/2017   Trigger finger of left thumb 01/03/2017   Trigger finger of right thumb 01/03/2017   Trigger index finger of left hand 01/03/2017   Trigger index  finger of right hand 01/03/2017   Malnutrition of moderate degree 07/19/2016   Intra-abdominal abscess (HCC) 07/16/2016   Cellulitis 07/15/2016   S/P colostomy takedown 06/28/2016   Pain in thoracic spine 06/09/2016   Mid back pain 06/09/2016   Postoperative fever 11/19/2015   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   Severe protein-calorie malnutrition (HCC) 11/17/2015   Sepsis (HCC) 11/14/2015   Hiccups 11/14/2015   AKI (acute kidney injury) (HCC) 11/14/2015   Fever    Leg swelling    Left shoulder pain    Muscle spasm of left shoulder    GI bleed 10/27/2015   Acute blood loss anemia 10/27/2015   Syncope 10/27/2015   Hyperglycemia 10/27/2015   Hypotension 10/27/2015   Neck pain 10/27/2015   Hematemesis 10/27/2015   Hematochezia 10/27/2015   Diverticulitis of colon  with perforation 07/05/2015    PCP: Terris Fickle MD   REFERRING PROVIDER: Benedetto Brady MD   REFERRING DIAG:  DiagnosisM54.50 (ICD-10-CM) - Low back pain, unspecified  Rationale for Evaluation and Treatment: Rehabilitation  THERAPY DIAG:  Other low back pain  Pain in thoracic spine  Abnormal posture  ONSET DATE: 1 month prior   SUBJECTIVE:                                                                                                                                                                                           SUBJECTIVE STATEMENT: The patient has had his scan. They think his stones are coming from his liver. He is feeling the besthe has as far as his back.  PERTINENT HISTORY:  Anxiety, anemia, kidney stones,  PAIN:  Are you having pain? Yes: NPRS scale: 3/10 Pain location:left abdomen  Pain description: aching/constant  Aggravating factors: Standing and walking  Relieving factors: rest   PRECAUTIONS: None  RED FLAGS: None   WEIGHT BEARING RESTRICTIONS: No  FALLS:  Has patient fallen in last 6 months? No  LIVING ENVIRONMENT: Nothing pertinent   OCCUPATION:  Retired   Presenter, broadcasting:   Yard work   PLOF: Independent  PATIENT GOALS:  To have less pain To get stronger    NEXT MD VISIT:  Nothing scheduled.   OBJECTIVE:  Note: Objective measures were completed at Evaluation unless otherwise noted.  DIAGNOSTIC FINDINGS:  MRI 2022   IMPRESSION: 1. Multilevel spondylosis of the lumbar spine as described. 2. Mild foraminal narrowing bilaterally at L1-2 and L2-3 is worse on the left. Slight retrolisthesis and uncovering of a broad-based disc protrusion contribute at both levels. 3. Mild central and bilateral foraminal narrowing at L3-4 secondary to a broad-based disc protrusion and facet hypertrophy. 4. Mild central and mild to moderate foraminal narrowing bilaterally at L4-5 is worse right than left. 5. Moderate left and mild right foraminal stenosis at L5-S1 due to endplate and facet spurring. 6. Cholelithiasis without evidence for cholecystitis.  PATIENT SURVEYS:  LEFS 49/80  COGNITION: Overall cognitive status: Within functional limits for tasks assessed     SENSATION: WFL    POSTURE: rounded shoulders, forward head, and flexed trunk   PALPATION: Significant spasming in left paraspinals and gluteal   LUMBAR ROM:   AROM eval 6/2   Flexion Limited 25% with pain    Extension Painful past neutral    Right lateral flexion    Left lateral flexion    Right rotation    Left rotation     (Blank rows = not tested)  LOWER EXTREMITY ROM:     LOWER  EXTREMITY MMT:    MMT Right eval Left eval Right 6/2  Left   Hip flexion 17.5 16.3 34.1 33.7  Hip extension      Hip abduction 28.7 26.3 37.9 33.0  Hip adduction      Hip internal rotation      Hip external rotation      Knee flexion      Knee extension 34.3 32.1 35.1 45.1  Ankle dorsiflexion      Ankle plantarflexion      Ankle inversion      Ankle eversion       (Blank rows = not tested)    GAIT: Flexed posture   6/2 Improved posture with gait without cuing    TREATMENT DATE:   6/6 Reviewed strength measurements/ hand held dyno testing  Nu-step 5 min  LF triceps press down 3x10 40 lbs  LF row 3x10 40 lbs  Leg rpess 40 lbs 3x12 cybex  Hip abdcution 45 lbs 3x12       Manual:  Trigger point therapy and STM to lumbar paraspinals and L QL region Thoracic and lumbar PA's grade 2 Skilled palpation of trigger points  6/2 There-ex: Reviewed strength measurements/ hand held dyno testing  Nu-step 5 min  LF triceps press down 3x10 40 lbs  LF row 3x10 40 lbs   Neuro re-ed:  Cable:  Row 3x10 15 lbs  Cable extensions 3x10 15 lbs           Manual:  Trigger point therapy and STM to lumbar paraspinals and L QL region Thoracic and lumbar PA's grade 2 Skilled palpation of trigger points 5/28 Initial Treatment: Pt instructed on Dry Needling rational, procedures, and possible side effects. Pt instructed to expect mild to moderate muscle soreness later in the day and/or into the next day.  Pt instructed in methods to reduce muscle soreness. Pt instructed to continue prescribed HEP. Because Dry Needling was performed over or adjacent to a lung field, pt was educated on S/S of pneumothorax and to seek immediate medical attention should they occur.  Patient was educated on signs and symptoms of infection and other risk factors and advised to seek medical attention should they occur.  Patient verbalized understanding of these instructions and education.      Manual:  Trigger point therapy and STM to lumbar paraspinals and L QL region Thoracic and lumbar PA's grade 2 Skilled palpation of trigger points  Nuero-re-ed:  Bilateral shld flexion with weight 3x10 1 lb  Bilateral bicep curl 3lb 3x12  Punches 2lbs 3x10 each arm   All with cuing for posture in sitting.                                                                                                                                          PATIENT EDUCATION:  Education details: HEP, symptom  management  Person educated: Patient Education  method: Explanation, Demonstration, Tactile cues, Verbal cues, and Handouts Education comprehension: verbalized understanding, returned demonstration, verbal cues required, tactile cues required, and needs further education  HOME EXERCISE PROGRAM: Access Code: [email or web address] URL: [email or web address] Date: 07/05/2023 Prepared by: Signa Drier  Exercises - Standing Glute Med Mobilization with Small Ball on Wall  - 1 x daily - 7 x weekly - 3 sets - 10 reps - Low Horizontal Abduction with Resistance  - 1 x daily - 7 x weekly - 3 sets - 10 reps - Shoulder External Rotation and Scapular Retraction with Resistance  - 1 x daily - 7 x weekly - 3 sets - 10 reps  ASSESSMENT:  CLINICAL IMPRESSION: Therapy continues to develop him a gym program. He was encouraged to continue with his exercises at home. He reports he does a lot of daily tasks. He was advised he needs to do focused exercises as well. His posture is better in standing without cuing. Therapy will continue to progress as tolerated.   Patient is a 73 year old male who presents with left-sided low back pain.  Earlier this month he had an acute onset of right-sided low back pain as well.  He had a course of prednisone which resolved this.  At this time he feels like most of his pain is back to being focused on the left side.  Has had success with physical therapy before.  He is not currently doing any of the exercises he was doing before.  He has increased pain with activity.  He has limited lumbar motion as well as limited bilateral lower extremity strength.  He has a long complicated history of abdominal surgeries.  He currently has mesh that causes him pain with activity.  He would benefit from skilled therapy to improve core strength and stability, reduce pain in lower back, and reviewed total program future exacerbations of pain.   OBJECTIVE IMPAIRMENTS: Abnormal gait, decreased  activity tolerance, decreased endurance, decreased ROM, decreased strength, improper body mechanics, and postural dysfunction.   ACTIVITY LIMITATIONS: carrying, lifting, bending, standing, squatting, sleeping, stairs, and transfers  PARTICIPATION LIMITATIONS: meal prep, cleaning, laundry, shopping, community activity, occupation, and yard work  PERSONAL FACTORS: 1-2 comorbidities: Multiple abdominal surgeries, history of kidney stones are also affecting patient's functional outcome.   REHAB POTENTIAL: Good  CLINICAL DECISION MAKING: Evolving/moderate complexity  EVALUATION COMPLEXITY: Moderate   GOALS: Goals reviewed with patient? Yes SHORT TERM GOALS: Target date: 08/02/2023     Patient will increase lumbar flexion to full without pain Baseline: Goal status:  mild pain at end rage 6/3   2.  Patient will increase gross bilateral lower extremity strength by 5 pounds Baseline:  Goal status: achieved in most muscle groups 6/3   3.  Patient will be independent and complaint with basic HEP  Baseline:  Goal status: independent      LONG TERM GOALS: Target date: 08/30/2023     Patient will return to using the weed wacker without pain  Baseline:  Goal status: improving 6/3    2.  Patient will reach overhead to a shelf without pain  Baseline:  Goal status: improving 6/3   3.  Patient will show compliance to long term program for strengthening  Baseline:  Goal status: ongoing 6/3   PLAN:  PT FREQUENCY: 2x/week  PT DURATION: 8 weeks  PLANNED INTERVENTIONS: 97110-Therapeutic exercises, 97530- Therapeutic activity, [id number]- Neuromuscular re-education, 97535- Self Care, [id number]- Manual therapy, [id number]- Gait training, [phone]- Aquatic Therapy, 97014- Electrical stimulation (unattended), [phone]- Ultrasound, Patient/Family education,  Stair training, Taping, Dry Needling, DME instructions, Cryotherapy, and Moist heat .  PLAN FOR NEXT SESSION: Consider trigger point dry needling of  lumbar spine and gluteals.  Soft tissue mobilization to lumbar spine.  Consider LAD. Continue to expand home exercises and work on getting the patient to buy into home exercises.    Kitty Perkins, PT 09/02/2023, 8:25 AM   I have reviewed and concur with this student's documentation.    During this treatment session, the therapist was present, participating in and directing the treatment.

## 2023-09-07 LAB — COLOGUARD: COLOGUARD: NEGATIVE

## 2023-09-14 ENCOUNTER — Ambulatory Visit (HOSPITAL_BASED_OUTPATIENT_CLINIC_OR_DEPARTMENT_OTHER): Payer: Self-pay | Admitting: Physical Therapy

## 2023-09-14 DIAGNOSIS — M546 Pain in thoracic spine: Secondary | ICD-10-CM

## 2023-09-14 DIAGNOSIS — R293 Abnormal posture: Secondary | ICD-10-CM | POA: Diagnosis not present

## 2023-09-14 DIAGNOSIS — M5459 Other low back pain: Secondary | ICD-10-CM | POA: Diagnosis not present

## 2023-09-14 NOTE — Therapy (Signed)
 OUTPATIENT PHYSICAL THERAPY THORACOLUMBAR Progress Note    Patient Name: Miguel [name] MRN: [id number] DOB:11-Jan-1951, 73 y.o., male Today's Date: 09/14/2023  END OF SESSION:  PT End of Session - 09/14/23 1004     Visit Number 12    Number of Visits 26    Date for PT Re-Evaluation 10/25/23    PT Start Time 0943   Patient 13 min late   PT Stop Time 1022    PT Time Calculation (min) 39 min    Activity Tolerance Patient tolerated treatment well;No increased pain    Behavior During Therapy WFL for tasks assessed/performed                Past Medical History:  Diagnosis Date   Anemia    Anxiety    BPH (benign prostatic hyperplasia)    Gastritis    GERD (gastroesophageal reflux disease)    seldom (07/16/2016)   GI bleed due to NSAIDs 10/27/2015   History of blood transfusion 06/2016   post OR/notes 07/15/2016   History of hiatal hernia    History of kidney stones    surgery to remove stone   Hyperlipidemia    Hypertension    no meds    Melanoma of back (HCC)    mid back   Sigmoid diverticulitis    with perforation   Past Surgical History:  Procedure Laterality Date   COLON SURGERY     sigmoid   COLOSTOMY TAKEDOWN N/A 06/28/2016   Procedure: COLOSTOMY TAKEDOWN;  Surgeon: Oza Blumenthal, MD;  Location: [facility] OR;  Service: General;  Laterality: N/A;   CYSTOSCOPY WITH RETROGRADE PYELOGRAM, URETEROSCOPY AND STENT PLACEMENT Bilateral 02/12/2020   Procedure: CYSTOSCOPY WITH BILATERAL RETROGRADE PYELOGRAM,  AND LITHOPEXY;  Surgeon: Sherlyn Ditto, MD;  Location: [facility]S;  Service: Urology;  Laterality: Bilateral;  1 HR   ESOPHAGOGASTRODUODENOSCOPY N/A 10/27/2015   Procedure: ESOPHAGOGASTRODUODENOSCOPY (EGD);  Surgeon: Ozell Blunt, MD;  Location: [facility] ENDOSCOPY;  Service: Endoscopy;  Laterality: N/A;   ESOPHAGOGASTRODUODENOSCOPY N/A 10/29/2015   Procedure: ESOPHAGOGASTRODUODENOSCOPY (EGD);  Surgeon: Lanita Pitman, MD;  Location: [facility] ENDOSCOPY;  Service: Endoscopy;   Laterality: N/A;   ESOPHAGOGASTRODUODENOSCOPY N/A 10/30/2015   Procedure: ESOPHAGOGASTRODUODENOSCOPY (EGD);  Surgeon: Ozell Blunt, MD;  Location: [facility] ENDOSCOPY;  Service: Endoscopy;  Laterality: N/A;   g tube discontinued  04/2020   per patient   GASTROSTOMY TUBE PLACEMENT  11/21/2015   REDUCTION OF HIATAL HERNIA , REPAIR HIATAL HERNIA, RESECTION SMALL BOWEL WITH ANASTOMOSIS, PLACEMENT GASTROSTOMY TUBE, PLACEMENT DUODENOSTOMY TUBE (N/A)   HEMORRHOID BANDING  X 2   HERNIA REPAIR     HIATAL HERNIA REPAIR N/A 11/21/2015   Procedure: REDUCTION OF HIATAL HERNIA , REPAIR HIATAL HERNIA, RESECTION SMALL BOWEL WITH ANASTOMOSIS, PLACEMENT GASTROSTOMY TUBE, PLACEMENT DUODENOSTOMY TUBE;  Surgeon: Derral Flick, MD;  Location: [facility] OR;  Service: General;  Laterality: N/A;   HOLMIUM LASER APPLICATION Right 02/12/2020   Procedure: HOLMIUM LASER APPLICATION;  Surgeon: Sherlyn Ditto, MD;  Location: [facility];  Service: Urology;  Laterality: Right;   INCISIONAL HERNIA REPAIR  06/28/2016   open/notes 07/15/2016   INCISIONAL HERNIA REPAIR  03/17/2018   WITH MESH   INCISIONAL HERNIA REPAIR N/A 03/17/2018   Procedure: INCISIONAL HERNIA REPAIR WITH MESH;  Surgeon: Oza Blumenthal, MD;  Location: [facility] OR;  Service: General;  Laterality: N/A;   INCISIONAL HERNIA REPAIR N/A 10/16/2020   Procedure: Estrella Hench HERNIA REPAIR WITH MESH;  Surgeon: Oza Blumenthal, MD;  Location: [facility] OR;  Service: General;  Laterality:  N/A;   INGUINAL HERNIA REPAIR Bilateral 09/28/2018   INGUINAL HERNIA REPAIR Bilateral 09/28/2018   Procedure: BILATERAL OPEN INGUINAL HERNIA REPAIR WITH MESH;  Surgeon: Oza Blumenthal, MD;  Location: [facility] OR;  Service: General;  Laterality: Bilateral;  GENERAL AND TAP BLOCK   INSERTION OF MESH N/A 03/17/2018   Procedure: INSERTION OF MESH;  Surgeon: Oza Blumenthal, MD;  Location: [facility] OR;  Service: General;  Laterality: N/A;   INSERTION OF MESH Bilateral 09/28/2018   Procedure: Insertion Of Mesh;  Surgeon:  Oza Blumenthal, MD;  Location: [facility] OR;  Service: General;  Laterality: Bilateral;   IR CM INJ ANY COLONIC TUBE W/FLUORO  02/04/2017   IR GUIDED DRAIN W CATHETER PLACEMENT  07/06/2016   /NOTES 07/15/2016   IR PATIENT EVAL TECH 0-60 MINS  06/28/2019   IR RADIOLOGIST EVAL & MGMT  07/27/2016   IR RADIOLOGIST EVAL & MGMT  08/17/2016   IR RADIOLOGIST EVAL & MGMT  08/26/2016   IR REPLACE G-TUBE SIMPLE WO FLUORO  07/28/2017   IR REPLACE G-TUBE SIMPLE WO FLUORO  01/31/2018   IR REPLACE G-TUBE SIMPLE WO FLUORO  10/16/2018   IR REPLACE G-TUBE SIMPLE WO FLUORO  03/05/2019   IR REPLACE G-TUBE SIMPLE WO FLUORO  08/22/2019   IR REPLACE G-TUBE SIMPLE WO FLUORO  01/08/2020   IR REPLC GASTRO/COLONIC TUBE PERCUT W/FLUORO  08/04/2016   IR REPLC GASTRO/COLONIC TUBE PERCUT W/FLUORO  01/14/2017   IR US  GUIDE BX ASP/DRAIN  08/04/2016   KNEE CARTILAGE SURGERY Right 1971   opened me up   LAPAROTOMY N/A 07/05/2015   Procedure: PARTIAL SIGMOID COLECTOMY AND COLOSTOMY;  Surgeon: Oza Blumenthal, MD;  Location: [facility] OR;  Service: General;  Laterality: N/A;   MELANOMA EXCISION  2001   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  10/30/2015   Procedure: REMOVAL OF DISTAL STOMACH;  Surgeon: Jerryl Morin, MD;  Location: [facility] OR;  Service: General;;   REPAIR OF PERFORATED ULCER N/A 10/30/2015   Procedure: REPAIR OF BLEEDING  ULCER;  Surgeon: Jerryl Morin, MD;  Location: [facility] OR;  Service: General;  Laterality: N/A;   TUMOR EXCISION  2009   back; fatty tumor   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   Bilateral inguinal hernia 09/28/2018   Incisional hernia 03/17/2018   Trigger thumb, left thumb 01/19/2018   Trigger finger, left index finger 07/18/2017   Trigger finger, right ring finger 01/31/2017   Trigger finger of left thumb 01/03/2017   Trigger finger of right thumb 01/03/2017   Trigger index finger of left hand 01/03/2017    Trigger index finger of right hand 01/03/2017   Malnutrition of moderate degree 07/19/2016   Intra-abdominal abscess (HCC) 07/16/2016   Cellulitis 07/15/2016   S/P colostomy takedown 06/28/2016   Pain in thoracic spine 06/09/2016   Mid back pain 06/09/2016   Postoperative fever 11/19/2015   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   Severe protein-calorie malnutrition (HCC) 11/17/2015   Sepsis (HCC) 11/14/2015   Hiccups 11/14/2015   AKI (acute kidney injury) (HCC) 11/14/2015   Fever    Leg swelling    Left shoulder pain    Muscle spasm of left shoulder    GI bleed 10/27/2015   Acute blood loss anemia 10/27/2015   Syncope 10/27/2015   Hyperglycemia 10/27/2015   Hypotension 10/27/2015   Neck pain 10/27/2015   Hematemesis 10/27/2015   Hematochezia 10/27/2015  Diverticulitis of colon with perforation 07/05/2015    PCP: Terris Fickle MD   REFERRING PROVIDER: Benedetto Brady MD   REFERRING DIAG:  DiagnosisM54.50 (ICD-10-CM) - Low back pain, unspecified  Rationale for Evaluation and Treatment: Rehabilitation  THERAPY DIAG:  Other low back pain  Pain in thoracic spine  Abnormal posture  ONSET DATE: 1 month prior   SUBJECTIVE:                                                                                                                                                                                           SUBJECTIVE STATEMENT: The patient has had his scan. They think his stones are coming from his liver. He is feeling the besthe has as far as his back.  PERTINENT HISTORY:  Anxiety, anemia, kidney stones,  PAIN:  Are you having pain? Yes: NPRS scale: 3/10 Pain location:left abdomen  Pain description: aching/constant  Aggravating factors: Standing and walking  Relieving factors: rest   PRECAUTIONS: None  RED FLAGS: None   WEIGHT BEARING RESTRICTIONS: No  FALLS:  Has patient fallen in last 6 months? No  LIVING ENVIRONMENT: Nothing pertinent   OCCUPATION:   Retired   Presenter, broadcasting:  Yard work   PLOF: Independent  PATIENT GOALS:  To have less pain To get stronger    NEXT MD VISIT:  Nothing scheduled.   OBJECTIVE:  Note: Objective measures were completed at Evaluation unless otherwise noted.  DIAGNOSTIC FINDINGS:  MRI 2022   IMPRESSION: 1. Multilevel spondylosis of the lumbar spine as described. 2. Mild foraminal narrowing bilaterally at L1-2 and L2-3 is worse on the left. Slight retrolisthesis and uncovering of a broad-based disc protrusion contribute at both levels. 3. Mild central and bilateral foraminal narrowing at L3-4 secondary to a broad-based disc protrusion and facet hypertrophy. 4. Mild central and mild to moderate foraminal narrowing bilaterally at L4-5 is worse right than left. 5. Moderate left and mild right foraminal stenosis at L5-S1 due to endplate and facet spurring. 6. Cholelithiasis without evidence for cholecystitis.  PATIENT SURVEYS:  LEFS 49/80  COGNITION: Overall cognitive status: Within functional limits for tasks assessed     SENSATION: WFL    POSTURE: rounded shoulders, forward head, and flexed trunk   PALPATION: Significant spasming in left paraspinals and gluteal   LUMBAR ROM:   AROM eval 6/2   Flexion Limited 25% with pain    Extension Painful past neutral    Right lateral flexion    Left lateral flexion    Right rotation    Left rotation     (Blank rows = not tested)  LOWER EXTREMITY ROM:  LOWER EXTREMITY MMT:    MMT Right eval Left eval Right 6/2  Left   Hip flexion 17.5 16.3 34.1 33.7  Hip extension      Hip abduction 28.7 26.3 37.9 33.0  Hip adduction      Hip internal rotation      Hip external rotation      Knee flexion      Knee extension 34.3 32.1 35.1 45.1  Ankle dorsiflexion      Ankle plantarflexion      Ankle inversion      Ankle eversion       (Blank rows = not tested)    GAIT: Flexed posture   6/2 Improved posture with gait without cuing     TREATMENT DATE:  6/18   Manual:  Trigger point therapy and STM to lumbar paraspinals and L QL region Thoracic and lumbar PA's grade 2 Skilled palpation of trigger points  Neuro-re-ed  Hip abdcution 70 lbs 3x12 with cuing for posture   Cable:  Row 3x12 20 lbs  Extensions 3x12 10 lbs   Standing flexion and scaption 1lb 2x10 with mirror for visual cues to posture      6/6 Reviewed strength measurements/ hand held dyno testing  Nu-step 5 min  LF triceps press down 3x10 40 lbs  LF row 3x10 40 lbs  Leg rpess 40 lbs 3x12 cybex  Hip abdcution 45 lbs 3x12       Manual:  Trigger point therapy and STM to lumbar paraspinals and L QL region Thoracic and lumbar PA's grade 2 Skilled palpation of trigger points  6/2 There-ex: Reviewed strength measurements/ hand held dyno testing  Nu-step 5 min  LF triceps press down 3x10 40 lbs  LF row 3x10 40 lbs   Neuro re-ed:  Cable:  Row 3x10 15 lbs  Cable extensions 3x10 15 lbs           Manual:  Trigger point therapy and STM to lumbar paraspinals and L QL region Thoracic and lumbar PA's grade 2 Skilled palpation of trigger points                                                                                                                                        PATIENT EDUCATION:  Education details: HEP, symptom management  Person educated: Patient Education method: Explanation, Demonstration, Tactile cues, Verbal cues, and Handouts Education comprehension: verbalized understanding, returned demonstration, verbal cues required, tactile cues required, and needs further education  HOME EXERCISE PROGRAM: Access Code: [email or web address] URL: [email or web address] Date: 07/05/2023 Prepared by: Signa Drier  Exercises - Standing Glute Med Mobilization with Small Ball on Wall  - 1 x daily - 7 x weekly - 3 sets - 10 reps - Low Horizontal Abduction with Resistance  - 1 x daily - 7 x weekly - 3 sets - 10 reps -  Shoulder External Rotation and Scapular Retraction with Resistance  -  1 x daily - 7 x weekly - 3 sets - 10 reps  ASSESSMENT:  CLINICAL IMPRESSION: Therapy continues to progress the patients weights with his exercises. He has no increase in back pain he continues to have a trigger point in his lumbar spine but it is improving. We forward flexion of the shoulders in the mirror today. Therapy will continue to progress as tolerated.    Patient is a 73 year old male who presents with left-sided low back pain.  Earlier this month he had an acute onset of right-sided low back pain as well.  He had a course of prednisone which resolved this.  At this time he feels like most of his pain is back to being focused on the left side.  Has had success with physical therapy before.  He is not currently doing any of the exercises he was doing before.  He has increased pain with activity.  He has limited lumbar motion as well as limited bilateral lower extremity strength.  He has a long complicated history of abdominal surgeries.  He currently has mesh that causes him pain with activity.  He would benefit from skilled therapy to improve core strength and stability, reduce pain in lower back, and reviewed total program future exacerbations of pain.   OBJECTIVE IMPAIRMENTS: Abnormal gait, decreased activity tolerance, decreased endurance, decreased ROM, decreased strength, improper body mechanics, and postural dysfunction.   ACTIVITY LIMITATIONS: carrying, lifting, bending, standing, squatting, sleeping, stairs, and transfers  PARTICIPATION LIMITATIONS: meal prep, cleaning, laundry, shopping, community activity, occupation, and yard work  PERSONAL FACTORS: 1-2 comorbidities: Multiple abdominal surgeries, history of kidney stones are also affecting patient's functional outcome.   REHAB POTENTIAL: Good  CLINICAL DECISION MAKING: Evolving/moderate complexity  EVALUATION COMPLEXITY: Moderate   GOALS: Goals  reviewed with patient? Yes SHORT TERM GOALS: Target date: 08/02/2023     Patient will increase lumbar flexion to full without pain Baseline: Goal status:  mild pain at end rage 6/3   2.  Patient will increase gross bilateral lower extremity strength by 5 pounds Baseline:  Goal status: achieved in most muscle groups 6/3   3.  Patient will be independent and complaint with basic HEP  Baseline:  Goal status: independent      LONG TERM GOALS: Target date: 08/30/2023     Patient will return to using the weed wacker without pain  Baseline:  Goal status: improving 6/3    2.  Patient will reach overhead to a shelf without pain  Baseline:  Goal status: improving 6/3   3.  Patient will show compliance to long term program for strengthening  Baseline:  Goal status: ongoing 6/3   PLAN:  PT FREQUENCY: 2x/week  PT DURATION: 8 weeks  PLANNED INTERVENTIONS: [id number]-Therapeutic exercises, 97530- Therapeutic activity, [id number]- Neuromuscular re-education, 97535- Self Care, [id number]- Manual therapy, [id number]- Gait training, [phone]- Aquatic Therapy, [id number]- Electrical stimulation (unattended), 97035- Ultrasound, Patient/Family education, Stair training, Taping, Dry Needling, DME instructions, Cryotherapy, and Moist heat .  PLAN FOR NEXT SESSION: Consider trigger point dry needling of lumbar spine and gluteals.  Soft tissue mobilization to lumbar spine.  Consider LAD. Continue to expand home exercises and work on getting the patient to buy into home exercises.    Kitty Perkins, PT 09/14/2023, 10:17 AM   I have reviewed and concur with this student's documentation.    During this treatment session, the therapist was present, participating in and directing the treatment.

## 2023-09-15 DIAGNOSIS — R109 Unspecified abdominal pain: Secondary | ICD-10-CM | POA: Diagnosis not present

## 2023-09-15 DIAGNOSIS — K219 Gastro-esophageal reflux disease without esophagitis: Secondary | ICD-10-CM | POA: Diagnosis not present

## 2023-09-15 DIAGNOSIS — K8020 Calculus of gallbladder without cholecystitis without obstruction: Secondary | ICD-10-CM | POA: Diagnosis not present

## 2023-09-15 DIAGNOSIS — R748 Abnormal levels of other serum enzymes: Secondary | ICD-10-CM | POA: Diagnosis not present

## 2023-09-15 DIAGNOSIS — K5900 Constipation, unspecified: Secondary | ICD-10-CM | POA: Diagnosis not present

## 2023-09-20 ENCOUNTER — Ambulatory Visit (HOSPITAL_BASED_OUTPATIENT_CLINIC_OR_DEPARTMENT_OTHER): Payer: Self-pay | Admitting: Physical Therapy

## 2023-09-20 ENCOUNTER — Encounter (HOSPITAL_BASED_OUTPATIENT_CLINIC_OR_DEPARTMENT_OTHER): Payer: Self-pay | Admitting: Physical Therapy

## 2023-09-20 DIAGNOSIS — R293 Abnormal posture: Secondary | ICD-10-CM

## 2023-09-20 DIAGNOSIS — M5459 Other low back pain: Secondary | ICD-10-CM | POA: Diagnosis not present

## 2023-09-20 DIAGNOSIS — M546 Pain in thoracic spine: Secondary | ICD-10-CM

## 2023-09-20 NOTE — Therapy (Signed)
 OUTPATIENT PHYSICAL THERAPY THORACOLUMBAR Progress Note    Patient Name: Miguel [name] MRN: [id number] DOB:1950/07/11, 73 y.o., male Today's Date: 09/20/2023  END OF SESSION:  PT End of Session - 09/20/23 1018     Visit Number 13    Number of Visits 26    Date for PT Re-Evaluation 10/25/23    PT Start Time 1015    PT Stop Time 1058    PT Time Calculation (min) 43 min    Activity Tolerance Patient tolerated treatment well;No increased pain    Behavior During Therapy WFL for tasks assessed/performed                Past Medical History:  Diagnosis Date   Anemia    Anxiety    BPH (benign prostatic hyperplasia)    Gastritis    GERD (gastroesophageal reflux disease)    seldom (07/16/2016)   GI bleed due to NSAIDs 10/27/2015   History of blood transfusion 06/2016   post OR/notes 07/15/2016   History of hiatal hernia    History of kidney stones    surgery to remove stone   Hyperlipidemia    Hypertension    no meds    Melanoma of back (HCC)    mid back   Sigmoid diverticulitis    with perforation   Past Surgical History:  Procedure Laterality Date   COLON SURGERY     sigmoid   COLOSTOMY TAKEDOWN N/A 06/28/2016   Procedure: COLOSTOMY TAKEDOWN;  Surgeon: Vicenta Poli, MD;  Location: [facility] OR;  Service: General;  Laterality: N/A;   CYSTOSCOPY WITH RETROGRADE PYELOGRAM, URETEROSCOPY AND STENT PLACEMENT Bilateral 02/12/2020   Procedure: CYSTOSCOPY WITH BILATERAL RETROGRADE PYELOGRAM,  AND LITHOPEXY;  Surgeon: Rosalind Zachary NOVAK, MD;  Location: [facility] ORS;  Service: Urology;  Laterality: Bilateral;  1 HR   ESOPHAGOGASTRODUODENOSCOPY N/A 10/27/2015   Procedure: ESOPHAGOGASTRODUODENOSCOPY (EGD);  Surgeon: Oliva Boots, MD;  Location: [facility] ENDOSCOPY;  Service: Endoscopy;  Laterality: N/A;   ESOPHAGOGASTRODUODENOSCOPY N/A 10/29/2015   Procedure: ESOPHAGOGASTRODUODENOSCOPY (EGD);  Surgeon: Lamar Bunk, MD;  Location: [facility] ENDOSCOPY;  Service: Endoscopy;  Laterality: N/A;    ESOPHAGOGASTRODUODENOSCOPY N/A 10/30/2015   Procedure: ESOPHAGOGASTRODUODENOSCOPY (EGD);  Surgeon: Oliva Boots, MD;  Location: [facility] ENDOSCOPY;  Service: Endoscopy;  Laterality: N/A;   g tube discontinued  04/2020   per patient   GASTROSTOMY TUBE PLACEMENT  11/21/2015   REDUCTION OF HIATAL HERNIA , REPAIR HIATAL HERNIA, RESECTION SMALL BOWEL WITH ANASTOMOSIS, PLACEMENT GASTROSTOMY TUBE, PLACEMENT DUODENOSTOMY TUBE (N/A)   HEMORRHOID BANDING  X 2   HERNIA REPAIR     HIATAL HERNIA REPAIR N/A 11/21/2015   Procedure: REDUCTION OF HIATAL HERNIA , REPAIR HIATAL HERNIA, RESECTION SMALL BOWEL WITH ANASTOMOSIS, PLACEMENT GASTROSTOMY TUBE, PLACEMENT DUODENOSTOMY TUBE;  Surgeon: Herlene Beverley Bureau, MD;  Location: [facility] OR;  Service: General;  Laterality: N/A;   HOLMIUM LASER APPLICATION Right 02/12/2020   Procedure: HOLMIUM LASER APPLICATION;  Surgeon: Rosalind Zachary NOVAK, MD;  Location: [facility]S;  Service: Urology;  Laterality: Right;   INCISIONAL HERNIA REPAIR  06/28/2016   open/notes 07/15/2016   INCISIONAL HERNIA REPAIR  03/17/2018   WITH MESH   INCISIONAL HERNIA REPAIR N/A 03/17/2018   Procedure: INCISIONAL HERNIA REPAIR WITH MESH;  Surgeon: Poli Vicenta, MD;  Location: [facility] OR;  Service: General;  Laterality: N/A;   INCISIONAL HERNIA REPAIR N/A 10/16/2020   Procedure: SHIRLENE HERNIA REPAIR WITH MESH;  Surgeon: Poli Vicenta, MD;  Location: [facility] OR;  Service: General;  Laterality: N/A;   INGUINAL HERNIA  REPAIR Bilateral 09/28/2018   INGUINAL HERNIA REPAIR Bilateral 09/28/2018   Procedure: BILATERAL OPEN INGUINAL HERNIA REPAIR WITH MESH;  Surgeon: [name], MD;  Location: [facility] OR;  Service: General;  Laterality: Bilateral;  GENERAL AND TAP BLOCK   INSERTION OF MESH N/A 03/17/2018   Procedure: INSERTION OF MESH;  Surgeon: [name], MD;  Location: [facility] OR;  Service: General;  Laterality: N/A;   INSERTION OF MESH Bilateral 09/28/2018   Procedure: Insertion Of Mesh;  Surgeon: [name],  MD;  Location: [facility] OR;  Service: General;  Laterality: Bilateral;   IR CM INJ ANY COLONIC TUBE W/FLUORO  02/04/2017   IR GUIDED DRAIN W CATHETER PLACEMENT  07/06/2016   /NOTES 07/15/2016   IR PATIENT EVAL TECH 0-60 MINS  06/28/2019   IR RADIOLOGIST EVAL & MGMT  07/27/2016   IR RADIOLOGIST EVAL & MGMT  08/17/2016   IR RADIOLOGIST EVAL & MGMT  08/26/2016   IR REPLACE G-TUBE SIMPLE WO FLUORO  07/28/2017   IR REPLACE G-TUBE SIMPLE WO FLUORO  01/31/2018   IR REPLACE G-TUBE SIMPLE WO FLUORO  10/16/2018   IR REPLACE G-TUBE SIMPLE WO FLUORO  03/05/2019   IR REPLACE G-TUBE SIMPLE WO FLUORO  08/22/2019   IR REPLACE G-TUBE SIMPLE WO FLUORO  01/08/2020   IR REPLC GASTRO/COLONIC TUBE PERCUT W/FLUORO  08/04/2016   IR REPLC GASTRO/COLONIC TUBE PERCUT W/FLUORO  01/14/2017   IR US  GUIDE BX ASP/DRAIN  08/04/2016   KNEE CARTILAGE SURGERY Right 1971   opened me up   LAPAROTOMY N/A 07/05/2015   Procedure: PARTIAL SIGMOID COLECTOMY AND COLOSTOMY;  Surgeon: Berg Vernetta, MD;  Location: [facility] OR;  Service: General;  Laterality: N/A;   MELANOMA EXCISION  2001   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  10/30/2015   Procedure: REMOVAL OF DISTAL STOMACH;  Surgeon: Lynwood Pina, MD;  Location: [facility] OR;  Service: General;;   REPAIR OF PERFORATED ULCER N/A 10/30/2015   Procedure: REPAIR OF BLEEDING  ULCER;  Surgeon: Lynwood Pina, MD;  Location: [facility] OR;  Service: General;  Laterality: N/A;   TUMOR EXCISION  2009   back; fatty tumor   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   Bilateral inguinal hernia 09/28/2018   Incisional hernia 03/17/2018   Trigger thumb, left thumb 01/19/2018   Trigger finger, left index finger 07/18/2017   Trigger finger, right ring finger 01/31/2017   Trigger finger of left thumb 01/03/2017   Trigger finger of right thumb 01/03/2017   Trigger index finger of left hand 01/03/2017   Trigger index  finger of right hand 01/03/2017   Malnutrition of moderate degree 07/19/2016   Intra-abdominal abscess (HCC) 07/16/2016   Cellulitis 07/15/2016   S/P colostomy takedown 06/28/2016   Pain in thoracic spine 06/09/2016   Mid back pain 06/09/2016   Postoperative fever 11/19/2015   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   Severe protein-calorie malnutrition (HCC) 11/17/2015   Sepsis (HCC) 11/14/2015   Hiccups 11/14/2015   AKI (acute kidney injury) (HCC) 11/14/2015   Fever    Leg swelling    Left shoulder pain    Muscle spasm of left shoulder    GI bleed 10/27/2015   Acute blood loss anemia 10/27/2015   Syncope 10/27/2015   Hyperglycemia 10/27/2015   Hypotension 10/27/2015   Neck pain 10/27/2015   Hematemesis 10/27/2015   Hematochezia 10/27/2015   Diverticulitis of colon with perforation  07/05/2015    PCP: Ray Hutchinson MD   REFERRING PROVIDER: CHERYLE Frees MD   REFERRING DIAG:  DiagnosisM54.50 (ICD-10-CM) - Low back pain, unspecified  Rationale for Evaluation and Treatment: Rehabilitation  THERAPY DIAG:  Other low back pain  Pain in thoracic spine  Abnormal posture  ONSET DATE: 1 month prior   SUBJECTIVE:                                                                                                                                                                                           SUBJECTIVE STATEMENT: The patient feels like his back is doing well today. It hurt yesterday. He reports the pain comes and goes.    PERTINENT HISTORY:  Anxiety, anemia, kidney stones,  PAIN:  Are you having pain? Yes: NPRS scale: 3/10 Pain location:left abdomen  Pain description: aching/constant  Aggravating factors: Standing and walking  Relieving factors: rest   PRECAUTIONS: None  RED FLAGS: None   WEIGHT BEARING RESTRICTIONS: No  FALLS:  Has patient fallen in last 6 months? No  LIVING ENVIRONMENT: Nothing pertinent   OCCUPATION:  Retired   Presenter, broadcasting:  Yard work    PLOF: Independent  PATIENT GOALS:  To have less pain To get stronger    NEXT MD VISIT:  Nothing scheduled.   OBJECTIVE:  Note: Objective measures were completed at Evaluation unless otherwise noted.  DIAGNOSTIC FINDINGS:  MRI 2022   IMPRESSION: 1. Multilevel spondylosis of the lumbar spine as described. 2. Mild foraminal narrowing bilaterally at L1-2 and L2-3 is worse on the left. Slight retrolisthesis and uncovering of a broad-based disc protrusion contribute at both levels. 3. Mild central and bilateral foraminal narrowing at L3-4 secondary to a broad-based disc protrusion and facet hypertrophy. 4. Mild central and mild to moderate foraminal narrowing bilaterally at L4-5 is worse right than left. 5. Moderate left and mild right foraminal stenosis at L5-S1 due to endplate and facet spurring. 6. Cholelithiasis without evidence for cholecystitis.  PATIENT SURVEYS:  LEFS 49/80    COGNITION: Overall cognitive status: Within functional limits for tasks assessed     SENSATION: WFL    POSTURE: rounded shoulders, forward head, and flexed trunk   PALPATION: Significant spasming in left paraspinals and gluteal   LUMBAR ROM:   AROM eval 6/2   Flexion Limited 25% with pain    Extension Painful past neutral    Right lateral flexion    Left lateral flexion    Right rotation    Left rotation     (Blank rows = not tested)  LOWER EXTREMITY ROM:     LOWER EXTREMITY MMT:  MMT Right eval Left eval Right 6/2  Left   Hip flexion 17.5 16.3 34.1 33.7  Hip extension      Hip abduction 28.7 26.3 37.9 33.0  Hip adduction      Hip internal rotation      Hip external rotation      Knee flexion      Knee extension 34.3 32.1 35.1 45.1  Ankle dorsiflexion      Ankle plantarflexion      Ankle inversion      Ankle eversion       (Blank rows = not tested)    GAIT: Flexed posture   6/2 Improved posture with gait without cuing    TREATMENT DATE:  6/24    Manual:  Trigger point therapy and STM to lumbar paraspinals and L QL region Thoracic and lumbar PA's grade 2 Skilled palpation of trigger points  There-ex:  Cable:  Extensions 3x12 10 lbs   Cybex Row 3x12 20 lbs  LF chest press 3x10  Triceps press down LF 40 lbs 3x12     6/18   Manual:  Trigger point therapy and STM to lumbar paraspinals and L QL region Thoracic and lumbar PA's grade 2 Skilled palpation of trigger points  Neuro-re-ed  Hip abdcution 70 lbs 3x12 with cuing for posture   Cable:  Row 3x12 20 lbs  Extensions 3x12 10 lbs   Standing flexion and scaption 1lb 2x10 with mirror for visual cues to posture      6/6 Reviewed strength measurements/ hand held dyno testing  Nu-step 5 min  LF triceps press down 3x10 40 lbs  LF row 3x10 40 lbs  Leg rpess 40 lbs 3x12 cybex  Hip abdcution 45 lbs 3x12       Manual:  Trigger point therapy and STM to lumbar paraspinals and L QL region Thoracic and lumbar PA's grade 2 Skilled palpation of trigger points                                                                                                                    PATIENT EDUCATION:  Education details: HEP, symptom management  Person educated: Patient Education method: Explanation, Demonstration, Tactile cues, Verbal cues, and Handouts Education comprehension: verbalized understanding, returned demonstration, verbal cues required, tactile cues required, and needs further education  HOME EXERCISE PROGRAM: Access Code: [email or web address] URL: [email or web address] Date: 07/05/2023 Prepared by: Alm Don  Exercises - Standing Glute Med Mobilization with Small Ball on Wall  - 1 x daily - 7 x weekly - 3 sets - 10 reps - Low Horizontal Abduction with Resistance  - 1 x daily - 7 x weekly - 3 sets - 10 reps - Shoulder External Rotation and Scapular Retraction with Resistance  - 1 x daily - 7 x weekly - 3 sets - 10 reps  ASSESSMENT:  CLINICAL  IMPRESSION: The patient is making good progress. We continue to expand his gym program. He had no significant pain with his exercises. We continue to discuss  RPE. Therapy will continue to progress as tolerated. He had a significant improvement in trigger points today.    Patient is a 73 year old male who presents with left-sided low back pain.  Earlier this month he had an acute onset of right-sided low back pain as well.  He had a course of prednisone which resolved this.  At this time he feels like most of his pain is back to being focused on the left side.  Has had success with physical therapy before.  He is not currently doing any of the exercises he was doing before.  He has increased pain with activity.  He has limited lumbar motion as well as limited bilateral lower extremity strength.  He has a long complicated history of abdominal surgeries.  He currently has mesh that causes him pain with activity.  He would benefit from skilled therapy to improve core strength and stability, reduce pain in lower back, and reviewed total program future exacerbations of pain.   OBJECTIVE IMPAIRMENTS: Abnormal gait, decreased activity tolerance, decreased endurance, decreased ROM, decreased strength, improper body mechanics, and postural dysfunction.   ACTIVITY LIMITATIONS: carrying, lifting, bending, standing, squatting, sleeping, stairs, and transfers  PARTICIPATION LIMITATIONS: meal prep, cleaning, laundry, shopping, community activity, occupation, and yard work  PERSONAL FACTORS: 1-2 comorbidities: Multiple abdominal surgeries, history of kidney stones are also affecting patient's functional outcome.   REHAB POTENTIAL: Good  CLINICAL DECISION MAKING: Evolving/moderate complexity  EVALUATION COMPLEXITY: Moderate   GOALS: Goals reviewed with patient? Yes SHORT TERM GOALS: Target date: 08/02/2023     Patient will increase lumbar flexion to full without pain Baseline: Goal status:  mild pain at  end rage 6/3   2.  Patient will increase gross bilateral lower extremity strength by 5 pounds Baseline:  Goal status: achieved in most muscle groups 6/3   3.  Patient will be independent and complaint with basic HEP  Baseline:  Goal status: independent      LONG TERM GOALS: Target date: 08/30/2023     Patient will return to using the weed wacker without pain  Baseline:  Goal status: improving 6/3    2.  Patient will reach overhead to a shelf without pain  Baseline:  Goal status: improving 6/3   3.  Patient will show compliance to long term program for strengthening  Baseline:  Goal status: ongoing 6/3   PLAN:  PT FREQUENCY: 2x/week  PT DURATION: 8 weeks  PLANNED INTERVENTIONS: 97110-Therapeutic exercises, 97530- Therapeutic activity, [id number]- Neuromuscular re-education, 97535- Self Care, [id number]- Manual therapy, [id number]- Gait training, [phone]- Aquatic Therapy, [id number]- Electrical stimulation (unattended), 97035- Ultrasound, Patient/Family education, Stair training, Taping, Dry Needling, DME instructions, Cryotherapy, and Moist heat .  PLAN FOR NEXT SESSION: Consider trigger point dry needling of lumbar spine and gluteals.  Soft tissue mobilization to lumbar spine.  Consider LAD. Continue to expand home exercises and work on getting the patient to buy into home exercises.    Alm JINNY Don, PT 09/20/2023, 10:51 AM

## 2023-09-25 DIAGNOSIS — Z125 Encounter for screening for malignant neoplasm of prostate: Secondary | ICD-10-CM | POA: Diagnosis not present

## 2023-09-25 DIAGNOSIS — R000 Tachycardia, unspecified: Secondary | ICD-10-CM | POA: Diagnosis not present

## 2023-09-25 DIAGNOSIS — N390 Urinary tract infection, site not specified: Secondary | ICD-10-CM | POA: Diagnosis not present

## 2023-09-25 DIAGNOSIS — Z6822 Body mass index (BMI) 22.0-22.9, adult: Secondary | ICD-10-CM | POA: Diagnosis not present

## 2023-09-25 DIAGNOSIS — N400 Enlarged prostate without lower urinary tract symptoms: Secondary | ICD-10-CM | POA: Diagnosis not present

## 2023-09-27 DIAGNOSIS — N400 Enlarged prostate without lower urinary tract symptoms: Secondary | ICD-10-CM | POA: Diagnosis not present

## 2023-09-27 DIAGNOSIS — R7401 Elevation of levels of liver transaminase levels: Secondary | ICD-10-CM | POA: Diagnosis not present

## 2023-09-27 DIAGNOSIS — N390 Urinary tract infection, site not specified: Secondary | ICD-10-CM | POA: Diagnosis not present

## 2023-09-27 DIAGNOSIS — K759 Inflammatory liver disease, unspecified: Secondary | ICD-10-CM | POA: Diagnosis not present

## 2023-09-30 ENCOUNTER — Emergency Department (HOSPITAL_BASED_OUTPATIENT_CLINIC_OR_DEPARTMENT_OTHER)

## 2023-09-30 ENCOUNTER — Encounter (HOSPITAL_BASED_OUTPATIENT_CLINIC_OR_DEPARTMENT_OTHER): Payer: Self-pay

## 2023-09-30 ENCOUNTER — Emergency Department (HOSPITAL_BASED_OUTPATIENT_CLINIC_OR_DEPARTMENT_OTHER)
Admission: EM | Admit: 2023-09-30 | Discharge: 2023-09-30 | Disposition: A | Discharge status: Home / Self Care | Source: Ambulatory Visit | Attending: Emergency Medicine | Admitting: Emergency Medicine

## 2023-09-30 ENCOUNTER — Other Ambulatory Visit: Payer: Self-pay

## 2023-09-30 DIAGNOSIS — R188 Other ascites: Secondary | ICD-10-CM | POA: Diagnosis not present

## 2023-09-30 DIAGNOSIS — R945 Abnormal results of liver function studies: Secondary | ICD-10-CM | POA: Insufficient documentation

## 2023-09-30 DIAGNOSIS — N2882 Megaloureter: Secondary | ICD-10-CM | POA: Diagnosis not present

## 2023-09-30 DIAGNOSIS — N400 Enlarged prostate without lower urinary tract symptoms: Secondary | ICD-10-CM | POA: Diagnosis not present

## 2023-09-30 DIAGNOSIS — K8020 Calculus of gallbladder without cholecystitis without obstruction: Secondary | ICD-10-CM | POA: Diagnosis not present

## 2023-09-30 DIAGNOSIS — R7989 Other specified abnormal findings of blood chemistry: Secondary | ICD-10-CM

## 2023-09-30 LAB — COMPREHENSIVE METABOLIC PANEL WITH GFR
ALT: 239 U/L — ABNORMAL HIGH (ref 0–44)
AST: 49 U/L — ABNORMAL HIGH (ref 15–41)
Albumin: 4.3 g/dL (ref 3.5–5.0)
Alkaline Phosphatase: 530 U/L — ABNORMAL HIGH (ref 38–126)
Anion gap: 11 (ref 5–15)
BUN: 12 mg/dL (ref 8–23)
CO2: 27 mmol/L (ref 22–32)
Calcium: 9.3 mg/dL (ref 8.9–10.3)
Chloride: 100 mmol/L (ref 98–111)
Creatinine, Ser: 1.14 mg/dL (ref 0.61–1.24)
GFR, Estimated: >60 mL/min (ref >60)
Glucose, Bld: 75 mg/dL (ref 70–99)
Potassium: 4.2 mmol/L (ref 3.5–5.1)
Sodium: 138 mmol/L (ref 135–145)
Total Bilirubin: 0.4 mg/dL (ref 0.0–1.2)
Total Protein: 7.2 g/dL (ref 6.5–8.1)

## 2023-09-30 LAB — CBC WITH DIFFERENTIAL/PLATELET
Abs Immature Granulocytes: 0.09 K/uL — ABNORMAL HIGH (ref 0.00–0.07)
Basophils Absolute: 0 K/uL (ref 0.0–0.1)
Basophils Relative: 0 %
Eosinophils Absolute: 0 K/uL (ref 0.0–0.5)
Eosinophils Relative: 1 %
HCT: 35.8 % — ABNORMAL LOW (ref 39.0–52.0)
Hemoglobin: 11.8 g/dL — ABNORMAL LOW (ref 13.0–17.0)
Immature Granulocytes: 2 %
Lymphocytes Relative: 19 %
Lymphs Abs: 1 K/uL (ref 0.7–4.0)
MCH: 31 pg (ref 26.0–34.0)
MCHC: 33 g/dL (ref 30.0–36.0)
MCV: 94 fL (ref 80.0–100.0)
Monocytes Absolute: 0.6 K/uL (ref 0.1–1.0)
Monocytes Relative: 11 %
Neutro Abs: 3.5 K/uL (ref 1.7–7.7)
Neutrophils Relative %: 67 %
Platelets: 288 K/uL (ref 150–400)
RBC: 3.81 MIL/uL — ABNORMAL LOW (ref 4.22–5.81)
RDW: 13.5 % (ref 11.5–15.5)
WBC: 5.2 K/uL (ref 4.0–10.5)
nRBC: 0 % (ref 0.0–0.2)

## 2023-09-30 LAB — LIPASE, BLOOD: Lipase: 31 U/L (ref 11–51)

## 2023-09-30 MED ORDER — IOHEXOL 300 MG/ML  SOLN
80.0000 mL | Freq: Once | INTRAMUSCULAR | Status: AC | PRN
Start: 2023-09-30 — End: 2023-09-30
  Administered 2023-09-30: 80 mL via INTRAVENOUS

## 2023-09-30 NOTE — ED Provider Notes (Signed)
 [place] [facility]MENT AT [facility] Provider Note   CSN: [id number] Arrival date & time: 09/30/23  1421     Patient presents with: Abnormal Lab   Miguel [name] is a 73 y.o. male.   Patient called by primary care doctor from [facility] medical about abnormal liver function test and told that he needs to get seen today.  Patient has had abnormal liver function test since back in April.  Is followed by [facility] last saw them on June 19.  And also followed by [facility] surgery.  Patient's LFTs have really been transaminase elevation and alk phos elevation.  The total bili has been normal.  Patient if they had been told to come in today was not concerned and would have not been seen.  So no significant abdominal pain no fevers no chills no nausea vomiting.  General surgery felt he was a poor surgical candidate since he has had ultrasounds that have showed biliary sludge and gallstones.  That they did not want to electively remove his gallbladder.  Followed by [facility] as mentioned.  And they were planning on a CT scan of the abdomen and pelvis this Friday.  Patient has had multiple surgeries on his abdomen before.  Secondary to diverticulitis perforation with partial of the colon.  Complicated by abscesses.  And ventral hernias.       Prior to Admission medications   Medication Sig Start Date End Date Taking? Authorizing Provider  acetaminophen  (TYLENOL ) 500 MG tablet Take 1 tablet (500 mg total) by mouth every 8 (eight) hours as needed for mild pain (for pain). Patient taking differently: Take 250-500 mg by mouth every 4 (four) hours as needed for mild pain (pain score 1-3) or fever. 12/05/15   [name], PA-C  diazepam  (VALIUM ) 5 MG tablet Take one tablet by mouth with food one hour prior to procedure. May repeat 30 minutes prior if needed. 04/14/21   [name], [name], NP  diclofenac sodium (VOLTAREN) 1 % GEL Apply 2 g topically 3 (three) times daily as needed  (joint pain).     [provider]  famotidine  (PEPCID ) 20 MG tablet Take 20 mg by mouth 2 (two) times daily.     [provider]  HYDROcodone -acetaminophen  (NORCO/VICODIN) 5-325 MG tablet Take 1 tablet by mouth 2 (two) times daily as needed for moderate pain. 07/06/21   [name], MD  LORazepam  (ATIVAN ) 1 MG tablet Take 0.5 mg by mouth daily as needed for anxiety. 05/29/18   [provider]  methocarbamol  (ROBAXIN ) 500 MG tablet Take 250-500 mg by mouth every 8 (eight) hours as needed for muscle spasms.  07/11/18   [provider]  methylPREDNISolone  (MEDROL ) 4 MG tablet Medrol  dose pack. Take as instructed 11/24/20   [name], MD  Multiple Vitamin (MULTIVITAMIN WITH MINERALS) TABS tablet Take 1 tablet by mouth daily.    [provider]  ondansetron  (ZOFRAN -ODT) 4 MG disintegrating tablet Take 4 mg by mouth every 6 (six) hours as needed for nausea/vomiting. 07/11/18   [provider]  oxyCODONE  (OXY IR/ROXICODONE ) 5 MG immediate release tablet Take 1 tablet (5 mg total) by mouth every 6 (six) hours as needed for moderate pain, severe pain or breakthrough pain. 10/20/20   [name], MD  oxyCODONE  (OXY IR/ROXICODONE ) 5 MG immediate release tablet Take 1 tablet (5 mg total) by mouth every 8 (eight) hours as needed. 01/17/23     pantoprazole  (PROTONIX ) 40 MG tablet Take  40 mg by mouth 2 (two) times daily. 01/18/18   [provider]  polyethylene glycol (MIRALAX / GLYCOLAX) 17 g packet Take 17 g by mouth daily as needed for mild constipation.    [provider]  Probiotic Product (PROBIOTIC DAILY PO) Take 1 capsule by mouth daily.    [provider]  tamsulosin  (FLOMAX ) 0.4 MG CAPS capsule Take 1 capsule (0.4 mg total) by mouth daily. 01/04/16   [name], [name], MD  tiZANidine (ZANAFLEX) 2 MG tablet Take 1-2 mg by mouth 2 (two) times daily as needed for muscle spasms. 01/16/20   [provider]  traMADol  (ULTRAM ) 50 MG tablet Take 50 mg by mouth every 6 (six) hours as needed for moderate pain. 05/21/16   [provider]  vitamin B-12 (CYANOCOBALAMIN) 1000 MCG tablet Take 1,000 mcg by mouth daily.     [provider]  zolpidem  (AMBIEN ) 10 MG tablet Take 10 mg by mouth at bedtime.    [provider]    Allergies: Patient has no known allergies.    Review of Systems  Constitutional:  Negative for chills and fever.  HENT:  Negative for ear pain and sore throat.   Eyes:  Negative for pain and visual disturbance.  Respiratory:  Negative for cough and shortness of breath.   Cardiovascular:  Negative for chest pain and palpitations.  Gastrointestinal:  Positive for abdominal pain. Negative for vomiting.  Genitourinary:  Negative for dysuria and hematuria.  Musculoskeletal:  Negative for arthralgias and back pain.  Skin:  Negative for color change and rash.  Neurological:  Negative for seizures and syncope.  All other systems reviewed and are negative.   Updated Vital Signs BP (!) 167/94   Pulse (!) 102   Temp 98.3 F (36.8 C)   Resp 16   Ht 1.651 m (5' 5)   Wt 60.8 kg   SpO2 97%   BMI 22.30 kg/m   Physical Exam Vitals and nursing note reviewed.  Constitutional:      General: He is not in acute distress.    Appearance: Normal appearance. He is well-developed.  HENT:     Head: Normocephalic and atraumatic.  Eyes:     General: No scleral icterus.    Extraocular Movements: Extraocular movements intact.     Conjunctiva/sclera: Conjunctivae normal.     Pupils: Pupils are equal, round, and reactive to light.  Cardiovascular:     Rate and Rhythm: Normal rate and regular rhythm.     Heart sounds: No murmur heard. Pulmonary:     Effort: Pulmonary effort is normal. No respiratory distress.     Breath sounds: Normal breath sounds.  Abdominal:     General: There is no distension.     Palpations: Abdomen is soft.     Tenderness:  There is no abdominal tenderness. There is no guarding.  Musculoskeletal:        General: No swelling.     Cervical back: Neck supple.     Right lower leg: No edema.     Left lower leg: No edema.  Skin:    General: Skin is warm and dry.     Capillary Refill: Capillary refill takes less than 2 seconds.     Coloration: Skin is not jaundiced.  Neurological:     Mental Status: He is alert and oriented to person, place, and time.  Psychiatric:        Mood and Affect: Mood normal.     (all labs  ordered are listed, but only abnormal results are displayed) Labs Reviewed  CBC WITH DIFFERENTIAL/PLATELET - Abnormal; Notable for the following components:      Result Value   RBC 3.81 (*)    Hemoglobin 11.8 (*)    HCT 35.8 (*)    Abs Immature Granulocytes 0.09 (*)    All other components within normal limits  COMPREHENSIVE METABOLIC PANEL WITH GFR - Abnormal; Notable for the following components:   AST 49 (*)    ALT 239 (*)    Alkaline Phosphatase 530 (*)    All other components within normal limits  LIPASE, BLOOD  HEPATITIS PANEL, ACUTE    EKG: None  Radiology: CT ABDOMEN PELVIS W CONTRAST Result Date: 09/30/2023 CLINICAL DATA:  Abdominal pain EXAM: CT ABDOMEN AND PELVIS WITH CONTRAST TECHNIQUE: Multidetector CT imaging of the abdomen and pelvis was performed using the standard protocol following bolus administration of intravenous contrast. RADIATION DOSE REDUCTION: This exam was performed according to the departmental dose-optimization program which includes automated exposure control, adjustment of the mA and/or kV according to patient size and/or use of iterative reconstruction technique. CONTRAST:  80mL OMNIPAQUE  IOHEXOL  300 MG/ML  SOLN COMPARISON:  CT 07/14/2023, 11/05/2021, 06/15/2018 FINDINGS: Lower chest: Lung bases demonstrate no acute airspace disease. Hepatobiliary: Subcentimeter hypodensities too small to further characterize. Gallstones. No biliary dilatation Pancreas:  Unremarkable. No pancreatic ductal dilatation or surrounding inflammatory changes. Spleen: Normal in size without focal abnormality. Adrenals/Urinary Tract: Adrenal glands show thickening but no dominant nodule. No hydronephrosis. Cystic dilatation of the distal ureters just proximal to the UVJ bilaterally. Punctate stones in the dilated distal ureter on the left but no obstructive features. This is a chronic finding. Stomach/Bowel: The stomach shows postsurgical changes corresponding to history of partial gastrectomy and gastrojejunostomy. Chronic fluid enlargement of the jejunal limb. There is no acute bowel wall thickening. Similar postsurgical changes at the rectosigmoid colon. Vascular/Lymphatic: Aortic atherosclerosis. No aneurysm. No suspicious lymph nodes. Stable large gastroesophageal varices. Reproductive: Enlarged prostate Other: Negative for pelvic effusion or free air. Musculoskeletal: No acute or suspicious osseous abnormality IMPRESSION: 1. No CT evidence for acute intra-abdominal or pelvic abnormality. 2. 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 No acute bowel inflammatory process. 3. Gallstones. 4. Enlarged prostate. 5. 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 Punctate stones in the dilated distal ureter on the left but no obstructive features. 6. Stable large gastroesophageal varices. 7. Aortic atherosclerosis. Aortic Atherosclerosis (ICD10-I70.0). Electronically Signed   By: Luke Bun M.D.   On: 09/30/2023 16:31     Procedures   Medications Ordered in the ED  iohexol  (OMNIPAQUE ) 300 MG/ML solution 80 mL (80 mLs Intravenous Contrast Given 09/30/23 1552)                                    Medical Decision Making Amount and/or Complexity of Data Reviewed Labs: ordered. Radiology: ordered.  Risk Prescription drug management.   Patient CBC white count 5.2.  Very reassuring hemoglobin 11.8 platelets  288.  Complete metabolic panel significant for AST 49 ALT 239 and alk phos 530 and total bili 0.4 renal function normal and electrolytes normal.  Transaminases and alk phos is higher than it was in April.  Patient's lipase also normal at 31.  CT scan abdomen pelvis was done because he is already had ultrasounds his most recent ultrasound was June 4.  That showed  gallstones and sludge but common bile duct was normal.  Today CT scan no CT evidence of any acute intra-abdominal or pelvic abnormality postsurgical changes of partial gastrectomy and gastrojejunostomy.  No acute bowel inflammatory process.  There are gallstones.  Chronic cystic dilatation of distal ureters just proximal to the UVJ bilaterally.  Stable large gastroesophageal varices.  Specifically CT upper around the gallbladder subcentimeter hypodensity too small to further characterize gallstones no biliary dilatation.  This appears to be all somewhat similar in baseline.  Seems to be no acute process.  Patient is followed by [facility] gastroenterology.  Would recommend that they contact them on Monday have them take a look at today's CT scan results.  I have sent off a hepatitis panel as well.  GI was aware of his abnormal liver function test.  Final diagnoses:  Abnormal liver function test    ED Discharge Orders     None          Geraldene Hamilton, MD 09/30/23 1655

## 2023-09-30 NOTE — ED Triage Notes (Signed)
 Pt reports being sent to ED for elevated liver enzymes due to biliary colic. Pt told to also come for CT scan due to possible obstruction.

## 2023-09-30 NOTE — Discharge Instructions (Signed)
 Follow-up with [facility] give them a call on Monday to let them know you had the CT scan done.  Since they were planning on CT scan on Friday.  May not be necessary.  Also I have sent off a hepatitis panel based on your liver function test.  No acute findings on CT scan.  No evidence of any biliary obstruction.

## 2023-10-04 DIAGNOSIS — R7401 Elevation of levels of liver transaminase levels: Secondary | ICD-10-CM | POA: Diagnosis not present

## 2023-10-04 DIAGNOSIS — N400 Enlarged prostate without lower urinary tract symptoms: Secondary | ICD-10-CM | POA: Diagnosis not present

## 2023-10-04 DIAGNOSIS — K8020 Calculus of gallbladder without cholecystitis without obstruction: Secondary | ICD-10-CM | POA: Diagnosis not present

## 2023-10-04 DIAGNOSIS — R0989 Other specified symptoms and signs involving the circulatory and respiratory systems: Secondary | ICD-10-CM | POA: Diagnosis not present

## 2023-10-04 DIAGNOSIS — R030 Elevated blood-pressure reading, without diagnosis of hypertension: Secondary | ICD-10-CM | POA: Diagnosis not present

## 2023-10-04 DIAGNOSIS — N390 Urinary tract infection, site not specified: Secondary | ICD-10-CM | POA: Diagnosis not present

## 2023-10-04 DIAGNOSIS — B159 Hepatitis A without hepatic coma: Secondary | ICD-10-CM | POA: Diagnosis not present

## 2023-10-07 ENCOUNTER — Encounter (HOSPITAL_BASED_OUTPATIENT_CLINIC_OR_DEPARTMENT_OTHER): Payer: Self-pay | Admitting: Physical Therapy

## 2023-10-07 ENCOUNTER — Ambulatory Visit (HOSPITAL_BASED_OUTPATIENT_CLINIC_OR_DEPARTMENT_OTHER): Attending: Family Medicine | Admitting: Physical Therapy

## 2023-10-07 ENCOUNTER — Ambulatory Visit
Admission: RE | Admit: 2023-10-07 | Discharge: 2023-10-07 | Disposition: A | Discharge status: Home / Self Care | Source: Ambulatory Visit | Attending: Family Medicine | Admitting: Family Medicine

## 2023-10-07 DIAGNOSIS — K449 Diaphragmatic hernia without obstruction or gangrene: Secondary | ICD-10-CM | POA: Diagnosis not present

## 2023-10-07 DIAGNOSIS — R293 Abnormal posture: Secondary | ICD-10-CM | POA: Diagnosis not present

## 2023-10-07 DIAGNOSIS — M546 Pain in thoracic spine: Secondary | ICD-10-CM | POA: Insufficient documentation

## 2023-10-07 DIAGNOSIS — M5459 Other low back pain: Secondary | ICD-10-CM | POA: Diagnosis not present

## 2023-10-07 DIAGNOSIS — I2510 Atherosclerotic heart disease of native coronary artery without angina pectoris: Secondary | ICD-10-CM | POA: Diagnosis not present

## 2023-10-07 DIAGNOSIS — R918 Other nonspecific abnormal finding of lung field: Secondary | ICD-10-CM | POA: Diagnosis not present

## 2023-10-07 DIAGNOSIS — J479 Bronchiectasis, uncomplicated: Secondary | ICD-10-CM | POA: Diagnosis not present

## 2023-10-07 NOTE — Therapy (Signed)
 OUTPATIENT PHYSICAL THERAPY THORACOLUMBAR Progress Note    Patient Name: Miguel [name] MRN: [id number] DOB:May 28, 1950, 73 y.o., male Today's Date: 10/07/2023  END OF SESSION:  PT End of Session - 10/07/23 1608     Visit Number 14    Number of Visits 26    Date for PT Re-Evaluation 10/25/23    PT Start Time 1600    PT Stop Time 1642    PT Time Calculation (min) 42 min    Activity Tolerance Patient tolerated treatment well;No increased pain    Behavior During Therapy WFL for tasks assessed/performed                Past Medical History:  Diagnosis Date   Anemia    Anxiety    BPH (benign prostatic hyperplasia)    Gastritis    GERD (gastroesophageal reflux disease)    seldom (07/16/2016)   GI bleed due to NSAIDs 10/27/2015   History of blood transfusion 06/2016   post OR/notes 07/15/2016   History of hiatal hernia    History of kidney stones    surgery to remove stone   Hyperlipidemia    Hypertension    no meds    Melanoma of back (HCC)    mid back   Sigmoid diverticulitis    with perforation   Past Surgical History:  Procedure Laterality Date   COLON SURGERY     sigmoid   COLOSTOMY TAKEDOWN N/A 06/28/2016   Procedure: COLOSTOMY TAKEDOWN;  Surgeon: Vicenta Poli, MD;  Location: [facility] OR;  Service: General;  Laterality: N/A;   CYSTOSCOPY WITH RETROGRADE PYELOGRAM, URETEROSCOPY AND STENT PLACEMENT Bilateral 02/12/2020   Procedure: CYSTOSCOPY WITH BILATERAL RETROGRADE PYELOGRAM,  AND LITHOPEXY;  Surgeon: Rosalind Zachary NOVAK, MD;  Location: [facility]S;  Service: Urology;  Laterality: Bilateral;  1 HR   ESOPHAGOGASTRODUODENOSCOPY N/A 10/27/2015   Procedure: ESOPHAGOGASTRODUODENOSCOPY (EGD);  Surgeon: Oliva Boots, MD;  Location: [facility] ENDOSCOPY;  Service: Endoscopy;  Laterality: N/A;   ESOPHAGOGASTRODUODENOSCOPY N/A 10/29/2015   Procedure: ESOPHAGOGASTRODUODENOSCOPY (EGD);  Surgeon: Lamar Bunk, MD;  Location: [facility] ENDOSCOPY;  Service: Endoscopy;  Laterality: N/A;    ESOPHAGOGASTRODUODENOSCOPY N/A 10/30/2015   Procedure: ESOPHAGOGASTRODUODENOSCOPY (EGD);  Surgeon: Oliva Boots, MD;  Location: [facility] ENDOSCOPY;  Service: Endoscopy;  Laterality: N/A;   g tube discontinued  04/2020   per patient   GASTROSTOMY TUBE PLACEMENT  11/21/2015   REDUCTION OF HIATAL HERNIA , REPAIR HIATAL HERNIA, RESECTION SMALL BOWEL WITH ANASTOMOSIS, PLACEMENT GASTROSTOMY TUBE, PLACEMENT DUODENOSTOMY TUBE (N/A)   HEMORRHOID BANDING  X 2   HERNIA REPAIR     HIATAL HERNIA REPAIR N/A 11/21/2015   Procedure: REDUCTION OF HIATAL HERNIA , REPAIR HIATAL HERNIA, RESECTION SMALL BOWEL WITH ANASTOMOSIS, PLACEMENT GASTROSTOMY TUBE, PLACEMENT DUODENOSTOMY TUBE;  Surgeon: Herlene Beverley Bureau, MD;  Location: [facility] OR;  Service: General;  Laterality: N/A;   HOLMIUM LASER APPLICATION Right 02/12/2020   Procedure: HOLMIUM LASER APPLICATION;  Surgeon: Rosalind Zachary NOVAK, MD;  Location: [facility];  Service: Urology;  Laterality: Right;   INCISIONAL HERNIA REPAIR  06/28/2016   open/notes 07/15/2016   INCISIONAL HERNIA REPAIR  03/17/2018   WITH MESH   INCISIONAL HERNIA REPAIR N/A 03/17/2018   Procedure: INCISIONAL HERNIA REPAIR WITH MESH;  Surgeon: Poli Vicenta, MD;  Location: [facility] OR;  Service: General;  Laterality: N/A;   INCISIONAL HERNIA REPAIR N/A 10/16/2020   Procedure: SHIRLENE HERNIA REPAIR WITH MESH;  Surgeon: Poli Vicenta, MD;  Location: [facility] OR;  Service: General;  Laterality: N/A;   INGUINAL HERNIA  REPAIR Bilateral 09/28/2018   INGUINAL HERNIA REPAIR Bilateral 09/28/2018   Procedure: BILATERAL OPEN INGUINAL HERNIA REPAIR WITH MESH;  Surgeon: [name], MD;  Location: [facility] OR;  Service: General;  Laterality: Bilateral;  GENERAL AND TAP BLOCK   INSERTION OF MESH N/A 03/17/2018   Procedure: INSERTION OF MESH;  Surgeon: [name], MD;  Location: [facility] OR;  Service: General;  Laterality: N/A;   INSERTION OF MESH Bilateral 09/28/2018   Procedure: Insertion Of Mesh;  Surgeon: [name],  MD;  Location: [facility] OR;  Service: General;  Laterality: Bilateral;   IR CM INJ ANY COLONIC TUBE W/FLUORO  02/04/2017   IR GUIDED DRAIN W CATHETER PLACEMENT  07/06/2016   /NOTES 07/15/2016   IR PATIENT EVAL TECH 0-60 MINS  06/28/2019   IR RADIOLOGIST EVAL & MGMT  07/27/2016   IR RADIOLOGIST EVAL & MGMT  08/17/2016   IR RADIOLOGIST EVAL & MGMT  08/26/2016   IR REPLACE G-TUBE SIMPLE WO FLUORO  07/28/2017   IR REPLACE G-TUBE SIMPLE WO FLUORO  01/31/2018   IR REPLACE G-TUBE SIMPLE WO FLUORO  10/16/2018   IR REPLACE G-TUBE SIMPLE WO FLUORO  03/05/2019   IR REPLACE G-TUBE SIMPLE WO FLUORO  08/22/2019   IR REPLACE G-TUBE SIMPLE WO FLUORO  01/08/2020   IR REPLC GASTRO/COLONIC TUBE PERCUT W/FLUORO  08/04/2016   IR REPLC GASTRO/COLONIC TUBE PERCUT W/FLUORO  01/14/2017   IR US  GUIDE BX ASP/DRAIN  08/04/2016   KNEE CARTILAGE SURGERY Right 1971   opened me up   LAPAROTOMY N/A 07/05/2015   Procedure: PARTIAL SIGMOID COLECTOMY AND COLOSTOMY;  Surgeon: Berg Vernetta, MD;  Location: [facility] OR;  Service: General;  Laterality: N/A;   MELANOMA EXCISION  2001   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  10/30/2015   Procedure: REMOVAL OF DISTAL STOMACH;  Surgeon: Lynwood Pina, MD;  Location: [facility] OR;  Service: General;;   REPAIR OF PERFORATED ULCER N/A 10/30/2015   Procedure: REPAIR OF BLEEDING  ULCER;  Surgeon: Lynwood Pina, MD;  Location: [facility] OR;  Service: General;  Laterality: N/A;   TUMOR EXCISION  2009   back; fatty tumor   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   Bilateral inguinal hernia 09/28/2018   Incisional hernia 03/17/2018   Trigger thumb, left thumb 01/19/2018   Trigger finger, left index finger 07/18/2017   Trigger finger, right ring finger 01/31/2017   Trigger finger of left thumb 01/03/2017   Trigger finger of right thumb 01/03/2017   Trigger index finger of left hand 01/03/2017   Trigger index  finger of right hand 01/03/2017   Malnutrition of moderate degree 07/19/2016   Intra-abdominal abscess (HCC) 07/16/2016   Cellulitis 07/15/2016   S/P colostomy takedown 06/28/2016   Pain in thoracic spine 06/09/2016   Mid back pain 06/09/2016   Postoperative fever 11/19/2015   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   Severe protein-calorie malnutrition (HCC) 11/17/2015   Sepsis (HCC) 11/14/2015   Hiccups 11/14/2015   AKI (acute kidney injury) (HCC) 11/14/2015   Fever    Leg swelling    Left shoulder pain    Muscle spasm of left shoulder    GI bleed 10/27/2015   Acute blood loss anemia 10/27/2015   Syncope 10/27/2015   Hyperglycemia 10/27/2015   Hypotension 10/27/2015   Neck pain 10/27/2015   Hematemesis 10/27/2015   Hematochezia 10/27/2015   Diverticulitis of colon with perforation  07/05/2015    PCP: Ray Hutchinson MD   REFERRING PROVIDER: CHERYLE Frees MD   REFERRING DIAG:  DiagnosisM54.50 (ICD-10-CM) - Low back pain, unspecified  Rationale for Evaluation and Treatment: Rehabilitation  THERAPY DIAG:  Other low back pain  Pain in thoracic spine  Abnormal posture  ONSET DATE: 1 month prior   SUBJECTIVE:                                                                                                                                                                                           SUBJECTIVE STATEMENT: Thepatients back is doing better. He continues to have issues with liver enzymes    PERTINENT HISTORY:  Anxiety, anemia, kidney stones,  PAIN:  Are you having pain? Yes: NPRS scale: 3/10 Pain location:left abdomen  Pain description: aching/constant  Aggravating factors: Standing and walking  Relieving factors: rest   PRECAUTIONS: None  RED FLAGS: None   WEIGHT BEARING RESTRICTIONS: No  FALLS:  Has patient fallen in last 6 months? No  LIVING ENVIRONMENT: Nothing pertinent   OCCUPATION:  Retired   Presenter, broadcasting:  Yard work   PLOF:  Independent  PATIENT GOALS:  To have less pain To get stronger    NEXT MD VISIT:  Nothing scheduled.   OBJECTIVE:  Note: Objective measures were completed at Evaluation unless otherwise noted.  DIAGNOSTIC FINDINGS:  MRI 2022   IMPRESSION: 1. Multilevel spondylosis of the lumbar spine as described. 2. Mild foraminal narrowing bilaterally at L1-2 and L2-3 is worse on the left. Slight retrolisthesis and uncovering of a broad-based disc protrusion contribute at both levels. 3. Mild central and bilateral foraminal narrowing at L3-4 secondary to a broad-based disc protrusion and facet hypertrophy. 4. Mild central and mild to moderate foraminal narrowing bilaterally at L4-5 is worse right than left. 5. Moderate left and mild right foraminal stenosis at L5-S1 due to endplate and facet spurring. 6. Cholelithiasis without evidence for cholecystitis.  PATIENT SURVEYS:  LEFS 49/80    COGNITION: Overall cognitive status: Within functional limits for tasks assessed     SENSATION: WFL    POSTURE: rounded shoulders, forward head, and flexed trunk   PALPATION: Significant spasming in left paraspinals and gluteal   LUMBAR ROM:   AROM eval 6/2   Flexion Limited 25% with pain    Extension Painful past neutral    Right lateral flexion    Left lateral flexion    Right rotation    Left rotation     (Blank rows = not tested)  LOWER EXTREMITY ROM:     LOWER EXTREMITY MMT:    MMT Right eval Left eval Right  6/2  Left   Hip flexion 17.5 16.3 34.1 33.7  Hip extension      Hip abduction 28.7 26.3 37.9 33.0  Hip adduction      Hip internal rotation      Hip external rotation      Knee flexion      Knee extension 34.3 32.1 35.1 45.1  Ankle dorsiflexion      Ankle plantarflexion      Ankle inversion      Ankle eversion       (Blank rows = not tested)    GAIT: Flexed posture   6/2 Improved posture with gait without cuing    TREATMENT DATE:  7/11  Manual:   Trigger point therapy and STM to lumbar paraspinals and L QL region Thoracic and lumbar PA's grade 2 Skilled palpation of trigger points There-ex There-ed Wand stretch for anterior chest  LF Hip abdcution 70 lbs 3x12  LF triceps press down 3x10 40 lbs  LF row 3x10 40 lbs  6/24   Manual:  Trigger point therapy and STM to lumbar paraspinals and L QL region Thoracic and lumbar PA's grade 2 Skilled palpation of trigger points  There-ex:  Cable:  Extensions 3x12 10 lbs   Cybex Row 3x12 20 lbs  LF chest press 3x10  Triceps press down LF 40 lbs 3x12     6/18   Manual:  Trigger point therapy and STM to lumbar paraspinals and L QL region Thoracic and lumbar PA's grade 2 Skilled palpation of trigger points  Neuro-re-ed  Hip abdcution 70 lbs 3x12 with cuing for posture   Cable:  Row 3x12 20 lbs  Extensions 3x12 10 lbs   Standing flexion and scaption 1lb 2x10 with mirror for visual cues to posture      6/6 Reviewed strength measurements/ hand held dyno testing  Nu-step 5 min  LF triceps press down 3x10 40 lbs  LF row 3x10 40 lbs  Leg rpess 40 lbs 3x12 cybex  Hip abdcution 45 lbs 3x12       Manual:  Trigger point therapy and STM to lumbar paraspinals and L QL region Thoracic and lumbar PA's grade 2 Skilled palpation of trigger points                                                                                                                    PATIENT EDUCATION:  Education details: HEP, symptom management  Person educated: Patient Education method: Explanation, Demonstration, Tactile cues, Verbal cues, and Handouts Education comprehension: verbalized understanding, returned demonstration, verbal cues required, tactile cues required, and needs further education  HOME EXERCISE PROGRAM: Access Code: [email or web address] URL: [email or web address] Date: 07/05/2023 Prepared by: Alm Don  Exercises - Standing Glute Med Mobilization with Small Ball on  Wall  - 1 x daily - 7 x weekly - 3 sets - 10 reps - Low Horizontal Abduction with Resistance  - 1 x daily - 7 x weekly - 3 sets - 10 reps - Shoulder External Rotation  and Scapular Retraction with Resistance  - 1 x daily - 7 x weekly - 3 sets - 10 reps  ASSESSMENT:  CLINICAL IMPRESSION: The patient is making good progress. We continue to expand his gym program. He had no significant pain with his exercises. We continue to discuss RPE. Therapy will continue to progress as tolerated. He had a significant improvement in trigger points today.    Patient is a 73 year old male who presents with left-sided low back pain.  Earlier this month he had an acute onset of right-sided low back pain as well.  He had a course of prednisone which resolved this.  At this time he feels like most of his pain is back to being focused on the left side.  Has had success with physical therapy before.  He is not currently doing any of the exercises he was doing before.  He has increased pain with activity.  He has limited lumbar motion as well as limited bilateral lower extremity strength.  He has a long complicated history of abdominal surgeries.  He currently has mesh that causes him pain with activity.  He would benefit from skilled therapy to improve core strength and stability, reduce pain in lower back, and reviewed total program future exacerbations of pain.   OBJECTIVE IMPAIRMENTS: Abnormal gait, decreased activity tolerance, decreased endurance, decreased ROM, decreased strength, improper body mechanics, and postural dysfunction.   ACTIVITY LIMITATIONS: carrying, lifting, bending, standing, squatting, sleeping, stairs, and transfers  PARTICIPATION LIMITATIONS: meal prep, cleaning, laundry, shopping, community activity, occupation, and yard work  PERSONAL FACTORS: 1-2 comorbidities: Multiple abdominal surgeries, history of kidney stones are also affecting patient's functional outcome.   REHAB POTENTIAL:  Good  CLINICAL DECISION MAKING: Evolving/moderate complexity  EVALUATION COMPLEXITY: Moderate   GOALS: Goals reviewed with patient? Yes SHORT TERM GOALS: Target date: 08/02/2023     Patient will increase lumbar flexion to full without pain Baseline: Goal status:  mild pain at end rage 6/3   2.  Patient will increase gross bilateral lower extremity strength by 5 pounds Baseline:  Goal status: achieved in most muscle groups 6/3   3.  Patient will be independent and complaint with basic HEP  Baseline:  Goal status: independent      LONG TERM GOALS: Target date: 08/30/2023     Patient will return to using the weed wacker without pain  Baseline:  Goal status: improving 6/3    2.  Patient will reach overhead to a shelf without pain  Baseline:  Goal status: improving 6/3   3.  Patient will show compliance to long term program for strengthening  Baseline:  Goal status: ongoing 6/3   PLAN:  PT FREQUENCY: 2x/week  PT DURATION: 8 weeks  PLANNED INTERVENTIONS: [id number]-Therapeutic exercises, 97530- Therapeutic activity, [id number]- Neuromuscular re-education, 97535- Self Care, [id number]- Manual therapy, [id number]- Gait training, [phone]- Aquatic Therapy, [id number]- Electrical stimulation (unattended), 97035- Ultrasound, Patient/Family education, Stair training, Taping, Dry Needling, DME instructions, Cryotherapy, and Moist heat .  PLAN FOR NEXT SESSION: Consider trigger point dry needling of lumbar spine and gluteals.  Soft tissue mobilization to lumbar spine.  Consider LAD. Continue to expand home exercises and work on getting the patient to buy into home exercises.    Alm JINNY Don, PT 10/07/2023, 4:47 PM

## 2023-10-10 LAB — HEPATITIS PANEL, ACUTE
HCV Ab: NONREACTIVE
Hep A IgM: REACTIVE — AB
Hep B C IgM: NONREACTIVE
Hepatitis B Surface Ag: NONREACTIVE

## 2023-10-11 ENCOUNTER — Ambulatory Visit (HOSPITAL_BASED_OUTPATIENT_CLINIC_OR_DEPARTMENT_OTHER): Admitting: Physical Therapy

## 2023-10-11 ENCOUNTER — Encounter (HOSPITAL_BASED_OUTPATIENT_CLINIC_OR_DEPARTMENT_OTHER): Admitting: Physical Therapy

## 2023-10-11 ENCOUNTER — Other Ambulatory Visit: Payer: Self-pay | Admitting: Family Medicine

## 2023-10-11 DIAGNOSIS — R293 Abnormal posture: Secondary | ICD-10-CM | POA: Diagnosis not present

## 2023-10-11 DIAGNOSIS — M546 Pain in thoracic spine: Secondary | ICD-10-CM

## 2023-10-11 DIAGNOSIS — R918 Other nonspecific abnormal finding of lung field: Secondary | ICD-10-CM

## 2023-10-11 DIAGNOSIS — M5459 Other low back pain: Secondary | ICD-10-CM

## 2023-10-12 ENCOUNTER — Encounter (HOSPITAL_BASED_OUTPATIENT_CLINIC_OR_DEPARTMENT_OTHER): Payer: Self-pay | Admitting: Physical Therapy

## 2023-10-12 NOTE — Therapy (Signed)
 OUTPATIENT PHYSICAL THERAPY THORACOLUMBAR Progress Note    Patient Name: Miguel [name] MRN: [id number] DOB:10-21-1950, 73 y.o., male Today's Date: 10/12/2023  END OF SESSION:  PT End of Session - 10/12/23 1047     Visit Number 15    Number of Visits 26    Date for PT Re-Evaluation 10/25/23    PT Start Time 1430    PT Stop Time 1510    PT Time Calculation (min) 40 min    Activity Tolerance Patient tolerated treatment well;No increased pain    Behavior During Therapy WFL for tasks assessed/performed                Past Medical History:  Diagnosis Date   Anemia    Anxiety    BPH (benign prostatic hyperplasia)    Gastritis    GERD (gastroesophageal reflux disease)    seldom (07/16/2016)   GI bleed due to NSAIDs 10/27/2015   History of blood transfusion 06/2016   post OR/notes 07/15/2016   History of hiatal hernia    History of kidney stones    surgery to remove stone   Hyperlipidemia    Hypertension    no meds    Melanoma of back (HCC)    mid back   Sigmoid diverticulitis    with perforation   Past Surgical History:  Procedure Laterality Date   COLON SURGERY     sigmoid   COLOSTOMY TAKEDOWN N/A 06/28/2016   Procedure: COLOSTOMY TAKEDOWN;  Surgeon: Vicenta Poli, MD;  Location: [facility] OR;  Service: General;  Laterality: N/A;   CYSTOSCOPY WITH RETROGRADE PYELOGRAM, URETEROSCOPY AND STENT PLACEMENT Bilateral 02/12/2020   Procedure: CYSTOSCOPY WITH BILATERAL RETROGRADE PYELOGRAM,  AND LITHOPEXY;  Surgeon: Rosalind Zachary NOVAK, MD;  Location: [facility];  Service: Urology;  Laterality: Bilateral;  1 HR   ESOPHAGOGASTRODUODENOSCOPY N/A 10/27/2015   Procedure: ESOPHAGOGASTRODUODENOSCOPY (EGD);  Surgeon: Oliva Boots, MD;  Location: [facility] ENDOSCOPY;  Service: Endoscopy;  Laterality: N/A;   ESOPHAGOGASTRODUODENOSCOPY N/A 10/29/2015   Procedure: ESOPHAGOGASTRODUODENOSCOPY (EGD);  Surgeon: Lamar Bunk, MD;  Location: [facility] ENDOSCOPY;  Service: Endoscopy;  Laterality: N/A;    ESOPHAGOGASTRODUODENOSCOPY N/A 10/30/2015   Procedure: ESOPHAGOGASTRODUODENOSCOPY (EGD);  Surgeon: Oliva Boots, MD;  Location: [facility] ENDOSCOPY;  Service: Endoscopy;  Laterality: N/A;   g tube discontinued  04/2020   per patient   GASTROSTOMY TUBE PLACEMENT  11/21/2015   REDUCTION OF HIATAL HERNIA , REPAIR HIATAL HERNIA, RESECTION SMALL BOWEL WITH ANASTOMOSIS, PLACEMENT GASTROSTOMY TUBE, PLACEMENT DUODENOSTOMY TUBE (N/A)   HEMORRHOID BANDING  X 2   HERNIA REPAIR     HIATAL HERNIA REPAIR N/A 11/21/2015   Procedure: REDUCTION OF HIATAL HERNIA , REPAIR HIATAL HERNIA, RESECTION SMALL BOWEL WITH ANASTOMOSIS, PLACEMENT GASTROSTOMY TUBE, PLACEMENT DUODENOSTOMY TUBE;  Surgeon: Herlene Beverley Bureau, MD;  Location: [facility] OR;  Service: General;  Laterality: N/A;   HOLMIUM LASER APPLICATION Right 02/12/2020   Procedure: HOLMIUM LASER APPLICATION;  Surgeon: Rosalind Zachary NOVAK, MD;  Location: [facility];  Service: Urology;  Laterality: Right;   INCISIONAL HERNIA REPAIR  06/28/2016   open/notes 07/15/2016   INCISIONAL HERNIA REPAIR  03/17/2018   WITH MESH   INCISIONAL HERNIA REPAIR N/A 03/17/2018   Procedure: INCISIONAL HERNIA REPAIR WITH MESH;  Surgeon: Poli Vicenta, MD;  Location: [facility] OR;  Service: General;  Laterality: N/A;   INCISIONAL HERNIA REPAIR N/A 10/16/2020   Procedure: SHIRLENE HERNIA REPAIR WITH MESH;  Surgeon: Poli Vicenta, MD;  Location: [facility] OR;  Service: General;  Laterality: N/A;   INGUINAL HERNIA  REPAIR Bilateral 09/28/2018   INGUINAL HERNIA REPAIR Bilateral 09/28/2018   Procedure: BILATERAL OPEN INGUINAL HERNIA REPAIR WITH MESH;  Surgeon: [name], MD;  Location: [facility] OR;  Service: General;  Laterality: Bilateral;  GENERAL AND TAP BLOCK   INSERTION OF MESH N/A 03/17/2018   Procedure: INSERTION OF MESH;  Surgeon: [name], MD;  Location: [facility] OR;  Service: General;  Laterality: N/A;   INSERTION OF MESH Bilateral 09/28/2018   Procedure: Insertion Of Mesh;  Surgeon: [name],  MD;  Location: [facility] OR;  Service: General;  Laterality: Bilateral;   IR CM INJ ANY COLONIC TUBE W/FLUORO  02/04/2017   IR GUIDED DRAIN W CATHETER PLACEMENT  07/06/2016   /NOTES 07/15/2016   IR PATIENT EVAL TECH 0-60 MINS  06/28/2019   IR RADIOLOGIST EVAL & MGMT  07/27/2016   IR RADIOLOGIST EVAL & MGMT  08/17/2016   IR RADIOLOGIST EVAL & MGMT  08/26/2016   IR REPLACE G-TUBE SIMPLE WO FLUORO  07/28/2017   IR REPLACE G-TUBE SIMPLE WO FLUORO  01/31/2018   IR REPLACE G-TUBE SIMPLE WO FLUORO  10/16/2018   IR REPLACE G-TUBE SIMPLE WO FLUORO  03/05/2019   IR REPLACE G-TUBE SIMPLE WO FLUORO  08/22/2019   IR REPLACE G-TUBE SIMPLE WO FLUORO  01/08/2020   IR REPLC GASTRO/COLONIC TUBE PERCUT W/FLUORO  08/04/2016   IR REPLC GASTRO/COLONIC TUBE PERCUT W/FLUORO  01/14/2017   IR US  GUIDE BX ASP/DRAIN  08/04/2016   KNEE CARTILAGE SURGERY Right 1971   opened me up   LAPAROTOMY N/A 07/05/2015   Procedure: PARTIAL SIGMOID COLECTOMY AND COLOSTOMY;  Surgeon: Berg Vernetta, MD;  Location: [facility] OR;  Service: General;  Laterality: N/A;   MELANOMA EXCISION  2001   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  10/30/2015   Procedure: REMOVAL OF DISTAL STOMACH;  Surgeon: Lynwood Pina, MD;  Location: [facility] OR;  Service: General;;   REPAIR OF PERFORATED ULCER N/A 10/30/2015   Procedure: REPAIR OF BLEEDING  ULCER;  Surgeon: Lynwood Pina, MD;  Location: [facility] OR;  Service: General;  Laterality: N/A;   TUMOR EXCISION  2009   back; fatty tumor   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   Bilateral inguinal hernia 09/28/2018   Incisional hernia 03/17/2018   Trigger thumb, left thumb 01/19/2018   Trigger finger, left index finger 07/18/2017   Trigger finger, right ring finger 01/31/2017   Trigger finger of left thumb 01/03/2017   Trigger finger of right thumb 01/03/2017   Trigger index finger of left hand 01/03/2017   Trigger index  finger of right hand 01/03/2017   Malnutrition of moderate degree 07/19/2016   Intra-abdominal abscess (HCC) 07/16/2016   Cellulitis 07/15/2016   S/P colostomy takedown 06/28/2016   Pain in thoracic spine 06/09/2016   Mid back pain 06/09/2016   Postoperative fever 11/19/2015   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   Severe protein-calorie malnutrition (HCC) 11/17/2015   Sepsis (HCC) 11/14/2015   Hiccups 11/14/2015   AKI (acute kidney injury) (HCC) 11/14/2015   Fever    Leg swelling    Left shoulder pain    Muscle spasm of left shoulder    GI bleed 10/27/2015   Acute blood loss anemia 10/27/2015   Syncope 10/27/2015   Hyperglycemia 10/27/2015   Hypotension 10/27/2015   Neck pain 10/27/2015   Hematemesis 10/27/2015   Hematochezia 10/27/2015   Diverticulitis of colon with perforation  07/05/2015    PCP: Ray Hutchinson MD   REFERRING PROVIDER: CHERYLE Frees MD   REFERRING DIAG:  DiagnosisM54.50 (ICD-10-CM) - Low back pain, unspecified  Rationale for Evaluation and Treatment: Rehabilitation  THERAPY DIAG:  Other low back pain  Pain in thoracic spine  Abnormal posture  ONSET DATE: 1 month prior   SUBJECTIVE:                                                                                                                                                                                           SUBJECTIVE STATEMENT: The patient reports his back has been pretty good. His liver enzymes have improved.   PERTINENT HISTORY:  Anxiety, anemia, kidney stones,  PAIN:  Are you having pain? Yes: NPRS scale: 3/10 Pain location:left abdomen  Pain description: aching/constant  Aggravating factors: Standing and walking  Relieving factors: rest   PRECAUTIONS: None  RED FLAGS: None   WEIGHT BEARING RESTRICTIONS: No  FALLS:  Has patient fallen in last 6 months? No  LIVING ENVIRONMENT: Nothing pertinent   OCCUPATION:  Retired   Presenter, broadcasting:  Yard work   PLOF:  Independent  PATIENT GOALS:  To have less pain To get stronger    NEXT MD VISIT:  Nothing scheduled.   OBJECTIVE:  Note: Objective measures were completed at Evaluation unless otherwise noted.  DIAGNOSTIC FINDINGS:  MRI 2022   IMPRESSION: 1. Multilevel spondylosis of the lumbar spine as described. 2. Mild foraminal narrowing bilaterally at L1-2 and L2-3 is worse on the left. Slight retrolisthesis and uncovering of a broad-based disc protrusion contribute at both levels. 3. Mild central and bilateral foraminal narrowing at L3-4 secondary to a broad-based disc protrusion and facet hypertrophy. 4. Mild central and mild to moderate foraminal narrowing bilaterally at L4-5 is worse right than left. 5. Moderate left and mild right foraminal stenosis at L5-S1 due to endplate and facet spurring. 6. Cholelithiasis without evidence for cholecystitis.  PATIENT SURVEYS:  LEFS 49/80    COGNITION: Overall cognitive status: Within functional limits for tasks assessed     SENSATION: WFL    POSTURE: rounded shoulders, forward head, and flexed trunk   PALPATION: Significant spasming in left paraspinals and gluteal   LUMBAR ROM:   AROM eval 6/2   Flexion Limited 25% with pain    Extension Painful past neutral    Right lateral flexion    Left lateral flexion    Right rotation    Left rotation     (Blank rows = not tested)  LOWER EXTREMITY ROM:     LOWER EXTREMITY MMT:    MMT Right eval Left eval Right  6/2  Left   Hip flexion 17.5 16.3 34.1 33.7  Hip extension      Hip abduction 28.7 26.3 37.9 33.0  Hip adduction      Hip internal rotation      Hip external rotation      Knee flexion      Knee extension 34.3 32.1 35.1 45.1  Ankle dorsiflexion      Ankle plantarflexion      Ankle inversion      Ankle eversion       (Blank rows = not tested)    GAIT: Flexed posture   6/2 Improved posture with gait without cuing    TREATMENT DATE:  7/15  Manual:   Trigger point therapy and STM to lumbar paraspinals and L QL region Thoracic and lumbar PA's grade 2 Skilled palpation of trigger points  There-ex Wand stretch for anterior chest  LF Hip abdcution 70 lbs 3x12  LF triceps press down 3x10 40 lbs  LF row 3x10 40 lbs LF shoulder press 2x10 20 lbs   7/11  Manual:  Trigger point therapy and STM to lumbar paraspinals and L QL region Thoracic and lumbar PA's grade 2 Skilled palpation of trigger points  There-ex Wand stretch for anterior chest  LF Hip abdcution 70 lbs 3x12  LF triceps press down 3x10 40 lbs  LF row 3x10 40 lbs  6/24   Manual:  Trigger point therapy and STM to lumbar paraspinals and L QL region Thoracic and lumbar PA's grade 2 Skilled palpation of trigger points  There-ex:  Cable:  Extensions 3x12 10 lbs   Cybex Row 3x12 20 lbs  LF chest press 3x10  Triceps press down LF 40 lbs 3x12     6/18   Manual:  Trigger point therapy and STM to lumbar paraspinals and L QL region Thoracic and lumbar PA's grade 2 Skilled palpation of trigger points  Neuro-re-ed  Hip abdcution 70 lbs 3x12 with cuing for posture   Cable:  Row 3x12 20 lbs  Extensions 3x12 10 lbs   Standing flexion and scaption 1lb 2x10 with mirror for visual cues to posture      6/6 Reviewed strength measurements/ hand held dyno testing  Nu-step 5 min  LF triceps press down 3x10 40 lbs  LF row 3x10 40 lbs  Leg rpess 40 lbs 3x12 cybex  Hip abdcution 45 lbs 3x12       Manual:  Trigger point therapy and STM to lumbar paraspinals and L QL region Thoracic and lumbar PA's grade 2 Skilled palpation of trigger points                                                                                                                    PATIENT EDUCATION:  Education details: HEP, symptom management  Person educated: Patient Education method: Explanation, Demonstration, Tactile cues, Verbal cues, and Handouts Education comprehension: verbalized  understanding, returned demonstration, verbal cues required, tactile cues required, and needs further education  HOME EXERCISE PROGRAM:  Access Code: [email or web address] URL: [email or web address] Date: 07/05/2023 Prepared by: Alm Don  Exercises - Standing Glute Med Mobilization with Small Ball on Wall  - 1 x daily - 7 x weekly - 3 sets - 10 reps - Low Horizontal Abduction with Resistance  - 1 x daily - 7 x weekly - 3 sets - 10 reps - Shoulder External Rotation and Scapular Retraction with Resistance  - 1 x daily - 7 x weekly - 3 sets - 10 reps  ASSESSMENT:  CLINICAL IMPRESSION: Therapy added in the shoulder press today. We continue to expand his gym exercises. He continues to have mild spasming in his mid back but it is improving. Therapy will continue to progress as tolerated.   Patient is a 73 year old male who presents with left-sided low back pain.  Earlier this month he had an acute onset of right-sided low back pain as well.  He had a course of prednisone which resolved this.  At this time he feels like most of his pain is back to being focused on the left side.  Has had success with physical therapy before.  He is not currently doing any of the exercises he was doing before.  He has increased pain with activity.  He has limited lumbar motion as well as limited bilateral lower extremity strength.  He has a long complicated history of abdominal surgeries.  He currently has mesh that causes him pain with activity.  He would benefit from skilled therapy to improve core strength and stability, reduce pain in lower back, and reviewed total program future exacerbations of pain.   OBJECTIVE IMPAIRMENTS: Abnormal gait, decreased activity tolerance, decreased endurance, decreased ROM, decreased strength, improper body mechanics, and postural dysfunction.   ACTIVITY LIMITATIONS: carrying, lifting, bending, standing, squatting, sleeping, stairs, and transfers  PARTICIPATION LIMITATIONS:  meal prep, cleaning, laundry, shopping, community activity, occupation, and yard work  PERSONAL FACTORS: 1-2 comorbidities: Multiple abdominal surgeries, history of kidney stones are also affecting patient's functional outcome.   REHAB POTENTIAL: Good  CLINICAL DECISION MAKING: Evolving/moderate complexity  EVALUATION COMPLEXITY: Moderate   GOALS: Goals reviewed with patient? Yes SHORT TERM GOALS: Target date: 08/02/2023     Patient will increase lumbar flexion to full without pain Baseline: Goal status:  mild pain at end rage 6/3   2.  Patient will increase gross bilateral lower extremity strength by 5 pounds Baseline:  Goal status: achieved in most muscle groups 6/3   3.  Patient will be independent and complaint with basic HEP  Baseline:  Goal status: independent      LONG TERM GOALS: Target date: 08/30/2023     Patient will return to using the weed wacker without pain  Baseline:  Goal status: improving 6/3    2.  Patient will reach overhead to a shelf without pain  Baseline:  Goal status: improving 6/3   3.  Patient will show compliance to long term program for strengthening  Baseline:  Goal status: ongoing 6/3   PLAN:  PT FREQUENCY: 2x/week  PT DURATION: 8 weeks  PLANNED INTERVENTIONS: 97110-Therapeutic exercises, 97530- Therapeutic activity, [id number]- Neuromuscular re-education, 97535- Self Care, [id number]- Manual therapy, [id number]- Gait training, [phone]- Aquatic Therapy, [id number]- Electrical stimulation (unattended), 97035- Ultrasound, Patient/Family education, Stair training, Taping, Dry Needling, DME instructions, Cryotherapy, and Moist heat .  PLAN FOR NEXT SESSION: Consider trigger point dry needling of lumbar spine and gluteals.  Soft tissue mobilization to lumbar spine.  Consider LAD. Continue to expand home exercises and work on getting the  patient to buy into home exercises.    Alm JINNY Don, PT 10/12/2023, 10:53 AM

## 2023-10-13 ENCOUNTER — Ambulatory Visit (HOSPITAL_COMMUNITY): Payer: Self-pay

## 2023-10-14 ENCOUNTER — Ambulatory Visit (HOSPITAL_BASED_OUTPATIENT_CLINIC_OR_DEPARTMENT_OTHER): Admitting: Physical Therapy

## 2023-10-14 ENCOUNTER — Encounter (HOSPITAL_BASED_OUTPATIENT_CLINIC_OR_DEPARTMENT_OTHER): Payer: Self-pay | Admitting: Physical Therapy

## 2023-10-14 DIAGNOSIS — M5459 Other low back pain: Secondary | ICD-10-CM

## 2023-10-14 DIAGNOSIS — R293 Abnormal posture: Secondary | ICD-10-CM | POA: Diagnosis not present

## 2023-10-14 DIAGNOSIS — M546 Pain in thoracic spine: Secondary | ICD-10-CM | POA: Diagnosis not present

## 2023-10-14 NOTE — Therapy (Signed)
 OUTPATIENT PHYSICAL THERAPY THORACOLUMBAR Progress Note    Patient Name: Miguel [name] MRN: [id number] DOB:1950-10-05, 73 y.o., male Today's Date: 10/14/2023  END OF SESSION:  PT End of Session - 10/14/23 1300     Visit Number 16    Number of Visits 26    Date for PT Re-Evaluation 10/25/23    PT Start Time 1255    PT Stop Time 1335    PT Time Calculation (min) 40 min    Activity Tolerance Patient tolerated treatment well;No increased pain    Behavior During Therapy WFL for tasks assessed/performed                 Past Medical History:  Diagnosis Date   Anemia    Anxiety    BPH (benign prostatic hyperplasia)    Gastritis    GERD (gastroesophageal reflux disease)    seldom (07/16/2016)   GI bleed due to NSAIDs 10/27/2015   History of blood transfusion 06/2016   post OR/notes 07/15/2016   History of hiatal hernia    History of kidney stones    surgery to remove stone   Hyperlipidemia    Hypertension    no meds    Melanoma of back (HCC)    mid back   Sigmoid diverticulitis    with perforation   Past Surgical History:  Procedure Laterality Date   COLON SURGERY     sigmoid   COLOSTOMY TAKEDOWN N/A 06/28/2016   Procedure: COLOSTOMY TAKEDOWN;  Surgeon: Vicenta Poli, MD;  Location: [facility] OR;  Service: General;  Laterality: N/A;   CYSTOSCOPY WITH RETROGRADE PYELOGRAM, URETEROSCOPY AND STENT PLACEMENT Bilateral 02/12/2020   Procedure: CYSTOSCOPY WITH BILATERAL RETROGRADE PYELOGRAM,  AND LITHOPEXY;  Surgeon: Rosalind Zachary NOVAK, MD;  Location: [facility];  Service: Urology;  Laterality: Bilateral;  1 HR   ESOPHAGOGASTRODUODENOSCOPY N/A 10/27/2015   Procedure: ESOPHAGOGASTRODUODENOSCOPY (EGD);  Surgeon: Oliva Boots, MD;  Location: [facility] ENDOSCOPY;  Service: Endoscopy;  Laterality: N/A;   ESOPHAGOGASTRODUODENOSCOPY N/A 10/29/2015   Procedure: ESOPHAGOGASTRODUODENOSCOPY (EGD);  Surgeon: Lamar Bunk, MD;  Location: [facility] ENDOSCOPY;  Service: Endoscopy;  Laterality: N/A;    ESOPHAGOGASTRODUODENOSCOPY N/A 10/30/2015   Procedure: ESOPHAGOGASTRODUODENOSCOPY (EGD);  Surgeon: Oliva Boots, MD;  Location: [facility] ENDOSCOPY;  Service: Endoscopy;  Laterality: N/A;   g tube discontinued  04/2020   per patient   GASTROSTOMY TUBE PLACEMENT  11/21/2015   REDUCTION OF HIATAL HERNIA , REPAIR HIATAL HERNIA, RESECTION SMALL BOWEL WITH ANASTOMOSIS, PLACEMENT GASTROSTOMY TUBE, PLACEMENT DUODENOSTOMY TUBE (N/A)   HEMORRHOID BANDING  X 2   HERNIA REPAIR     HIATAL HERNIA REPAIR N/A 11/21/2015   Procedure: REDUCTION OF HIATAL HERNIA , REPAIR HIATAL HERNIA, RESECTION SMALL BOWEL WITH ANASTOMOSIS, PLACEMENT GASTROSTOMY TUBE, PLACEMENT DUODENOSTOMY TUBE;  Surgeon: Herlene Beverley Bureau, MD;  Location: [facility] OR;  Service: General;  Laterality: N/A;   HOLMIUM LASER APPLICATION Right 02/12/2020   Procedure: HOLMIUM LASER APPLICATION;  Surgeon: Rosalind Zachary NOVAK, MD;  Location: [facility];  Service: Urology;  Laterality: Right;   INCISIONAL HERNIA REPAIR  06/28/2016   open/notes 07/15/2016   INCISIONAL HERNIA REPAIR  03/17/2018   WITH MESH   INCISIONAL HERNIA REPAIR N/A 03/17/2018   Procedure: INCISIONAL HERNIA REPAIR WITH MESH;  Surgeon: Poli Vicenta, MD;  Location: [facility] OR;  Service: General;  Laterality: N/A;   INCISIONAL HERNIA REPAIR N/A 10/16/2020   Procedure: SHIRLENE HERNIA REPAIR WITH MESH;  Surgeon: Poli Vicenta, MD;  Location: [facility] OR;  Service: General;  Laterality: N/A;   INGUINAL  HERNIA REPAIR Bilateral 09/28/2018   INGUINAL HERNIA REPAIR Bilateral 09/28/2018   Procedure: BILATERAL OPEN INGUINAL HERNIA REPAIR WITH MESH;  Surgeon: [name], MD;  Location: [facility] OR;  Service: General;  Laterality: Bilateral;  GENERAL AND TAP BLOCK   INSERTION OF MESH N/A 03/17/2018   Procedure: INSERTION OF MESH;  Surgeon: [name], MD;  Location: [facility] OR;  Service: General;  Laterality: N/A;   INSERTION OF MESH Bilateral 09/28/2018   Procedure: Insertion Of Mesh;  Surgeon: [name],  MD;  Location: [facility] OR;  Service: General;  Laterality: Bilateral;   IR CM INJ ANY COLONIC TUBE W/FLUORO  02/04/2017   IR GUIDED DRAIN W CATHETER PLACEMENT  07/06/2016   /NOTES 07/15/2016   IR PATIENT EVAL TECH 0-60 MINS  06/28/2019   IR RADIOLOGIST EVAL & MGMT  07/27/2016   IR RADIOLOGIST EVAL & MGMT  08/17/2016   IR RADIOLOGIST EVAL & MGMT  08/26/2016   IR REPLACE G-TUBE SIMPLE WO FLUORO  07/28/2017   IR REPLACE G-TUBE SIMPLE WO FLUORO  01/31/2018   IR REPLACE G-TUBE SIMPLE WO FLUORO  10/16/2018   IR REPLACE G-TUBE SIMPLE WO FLUORO  03/05/2019   IR REPLACE G-TUBE SIMPLE WO FLUORO  08/22/2019   IR REPLACE G-TUBE SIMPLE WO FLUORO  01/08/2020   IR REPLC GASTRO/COLONIC TUBE PERCUT W/FLUORO  08/04/2016   IR REPLC GASTRO/COLONIC TUBE PERCUT W/FLUORO  01/14/2017   IR US  GUIDE BX ASP/DRAIN  08/04/2016   KNEE CARTILAGE SURGERY Right 1971   opened me up   LAPAROTOMY N/A 07/05/2015   Procedure: PARTIAL SIGMOID COLECTOMY AND COLOSTOMY;  Surgeon: Berg Vernetta, MD;  Location: [facility] OR;  Service: General;  Laterality: N/A;   MELANOMA EXCISION  2001   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  10/30/2015   Procedure: REMOVAL OF DISTAL STOMACH;  Surgeon: Lynwood Pina, MD;  Location: [facility] OR;  Service: General;;   REPAIR OF PERFORATED ULCER N/A 10/30/2015   Procedure: REPAIR OF BLEEDING  ULCER;  Surgeon: Lynwood Pina, MD;  Location: [facility] OR;  Service: General;  Laterality: N/A;   TUMOR EXCISION  2009   back; fatty tumor   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   Bilateral inguinal hernia 09/28/2018   Incisional hernia 03/17/2018   Trigger thumb, left thumb 01/19/2018   Trigger finger, left index finger 07/18/2017   Trigger finger, right ring finger 01/31/2017   Trigger finger of left thumb 01/03/2017   Trigger finger of right thumb 01/03/2017   Trigger index finger of left hand 01/03/2017   Trigger index  finger of right hand 01/03/2017   Malnutrition of moderate degree 07/19/2016   Intra-abdominal abscess (HCC) 07/16/2016   Cellulitis 07/15/2016   S/P colostomy takedown 06/28/2016   Pain in thoracic spine 06/09/2016   Mid back pain 06/09/2016   Postoperative fever 11/19/2015   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   Severe protein-calorie malnutrition (HCC) 11/17/2015   Sepsis (HCC) 11/14/2015   Hiccups 11/14/2015   AKI (acute kidney injury) (HCC) 11/14/2015   Fever    Leg swelling    Left shoulder pain    Muscle spasm of left shoulder    GI bleed 10/27/2015   Acute blood loss anemia 10/27/2015   Syncope 10/27/2015   Hyperglycemia 10/27/2015   Hypotension 10/27/2015   Neck pain 10/27/2015   Hematemesis 10/27/2015   Hematochezia 10/27/2015   Diverticulitis of colon with  perforation 07/05/2015    PCP: Ray Hutchinson MD   REFERRING PROVIDER: CHERYLE Frees MD   REFERRING DIAG:  DiagnosisM54.50 (ICD-10-CM) - Low back pain, unspecified  Rationale for Evaluation and Treatment: Rehabilitation  THERAPY DIAG:  No diagnosis found.  ONSET DATE: 1 month prior   SUBJECTIVE:                                                                                                                                                                                           SUBJECTIVE STATEMENT: The patient reports his back has been pretty good. His liver enzymes have improved.   PERTINENT HISTORY:  Anxiety, anemia, kidney stones,  PAIN:  Are you having pain? Yes: NPRS scale: 3/10 Pain location:left abdomen  Pain description: aching/constant  Aggravating factors: Standing and walking  Relieving factors: rest   PRECAUTIONS: None  RED FLAGS: None   WEIGHT BEARING RESTRICTIONS: No  FALLS:  Has patient fallen in last 6 months? No  LIVING ENVIRONMENT: Nothing pertinent   OCCUPATION:  Retired   Presenter, broadcasting:  Yard work   PLOF: Independent  PATIENT GOALS:  To have less pain To get  stronger    NEXT MD VISIT:  Nothing scheduled.   OBJECTIVE:  Note: Objective measures were completed at Evaluation unless otherwise noted.  DIAGNOSTIC FINDINGS:  MRI 2022   IMPRESSION: 1. Multilevel spondylosis of the lumbar spine as described. 2. Mild foraminal narrowing bilaterally at L1-2 and L2-3 is worse on the left. Slight retrolisthesis and uncovering of a broad-based disc protrusion contribute at both levels. 3. Mild central and bilateral foraminal narrowing at L3-4 secondary to a broad-based disc protrusion and facet hypertrophy. 4. Mild central and mild to moderate foraminal narrowing bilaterally at L4-5 is worse right than left. 5. Moderate left and mild right foraminal stenosis at L5-S1 due to endplate and facet spurring. 6. Cholelithiasis without evidence for cholecystitis.  PATIENT SURVEYS:  LEFS 49/80    COGNITION: Overall cognitive status: Within functional limits for tasks assessed     SENSATION: WFL    POSTURE: rounded shoulders, forward head, and flexed trunk   PALPATION: Significant spasming in left paraspinals and gluteal   LUMBAR ROM:   AROM eval 6/2   Flexion Limited 25% with pain    Extension Painful past neutral    Right lateral flexion    Left lateral flexion    Right rotation    Left rotation     (Blank rows = not tested)  LOWER EXTREMITY ROM:     LOWER EXTREMITY MMT:    MMT Right eval Left eval Right 6/2  Left   Hip flexion 17.5  16.3 34.1 33.7  Hip extension      Hip abduction 28.7 26.3 37.9 33.0  Hip adduction      Hip internal rotation      Hip external rotation      Knee flexion      Knee extension 34.3 32.1 35.1 45.1  Ankle dorsiflexion      Ankle plantarflexion      Ankle inversion      Ankle eversion       (Blank rows = not tested)    GAIT: Flexed posture   6/2 Improved posture with gait without cuing    TREATMENT DATE:  7/18  Manual:  Trigger point therapy and STM to lumbar paraspinals and L QL  region Thoracic and lumbar PA's grade 2 Skilled palpation of trigger points  There-ex Wand stretch for anterior chest  LF Hip abdcution 70 lbs 3x12  LF triceps press down 3x10 40 lbs  LF row 3x10 40 lbs          Cybex Leg press 3x12 70lbs   7/15  Manual:  Trigger point therapy and STM to lumbar paraspinals and L QL region Thoracic and lumbar PA's grade 2 Skilled palpation of trigger points  There-ex Wand stretch for anterior chest  LF Hip abdcution 70 lbs 3x12  LF triceps press down 3x10 40 lbs  LF row 3x10 40 lbs LF shoulder press 2x10 20 lbs   7/11  Manual:  Trigger point therapy and STM to lumbar paraspinals and L QL region Thoracic and lumbar PA's grade 2 Skilled palpation of trigger points  There-ex Wand stretch for anterior chest  LF Hip abdcution 70 lbs 3x12  LF triceps press down 3x10 40 lbs  LF row 3x10 40 lbs  6/24   Manual:  Trigger point therapy and STM to lumbar paraspinals and L QL region Thoracic and lumbar PA's grade 2 Skilled palpation of trigger points  There-ex:  Cable:  Extensions 3x12 10 lbs   Cybex Row 3x12 20 lbs  LF chest press 3x10  Triceps press down LF 40 lbs 3x12     6/18   Manual:  Trigger point therapy and STM to lumbar paraspinals and L QL region Thoracic and lumbar PA's grade 2 Skilled palpation of trigger points  Neuro-re-ed  Hip abdcution 70 lbs 3x12 with cuing for posture   Cable:  Row 3x12 20 lbs  Extensions 3x12 10 lbs   Standing flexion and scaption 1lb 2x10 with mirror for visual cues to posture      6/6 Reviewed strength measurements/ hand held dyno testing  Nu-step 5 min  LF triceps press down 3x10 40 lbs  LF row 3x10 40 lbs  Leg rpess 40 lbs 3x12 cybex  Hip abdcution 45 lbs 3x12       Manual:  Trigger point therapy and STM to lumbar paraspinals and L QL region Thoracic and lumbar PA's grade 2 Skilled palpation of trigger points  PATIENT EDUCATION:  Education details: HEP, symptom management  Person educated: Patient Education method: Explanation, Demonstration, Tactile cues, Verbal cues, and Handouts Education comprehension: verbalized understanding, returned demonstration, verbal cues required, tactile cues required, and needs further education  HOME EXERCISE PROGRAM: Access Code: [email or web address] URL: [email or web address] Date: 07/05/2023 Prepared by: Alm Don  Exercises - Standing Glute Med Mobilization with Small Ball on Wall  - 1 x daily - 7 x weekly - 3 sets - 10 reps - Low Horizontal Abduction with Resistance  - 1 x daily - 7 x weekly - 3 sets - 10 reps - Shoulder External Rotation and Scapular Retraction with Resistance  - 1 x daily - 7 x weekly - 3 sets - 10 reps  ASSESSMENT:  CLINICAL IMPRESSION: Therapy worked on the leg press with the patient today. He tolerated well> We were able to dvance his weight. He continues to have spasming but it is improving. He had no pain coming in today. He has had a little aching in his stomach area which leads to pain in his back. He will ask his MD about a brace.  Patient is a 73 year old male who presents with left-sided low back pain.  Earlier this month he had an acute onset of right-sided low back pain as well.  He had a course of prednisone which resolved this.  At this time he feels like most of his pain is back to being focused on the left side.  Has had success with physical therapy before.  He is not currently doing any of the exercises he was doing before.  He has increased pain with activity.  He has limited lumbar motion as well as limited bilateral lower extremity strength.  He has a long complicated history of abdominal surgeries.  He currently has mesh that causes him pain with activity.  He would benefit from skilled therapy to improve core strength and stability, reduce pain in lower back, and  reviewed total program future exacerbations of pain.   OBJECTIVE IMPAIRMENTS: Abnormal gait, decreased activity tolerance, decreased endurance, decreased ROM, decreased strength, improper body mechanics, and postural dysfunction.   ACTIVITY LIMITATIONS: carrying, lifting, bending, standing, squatting, sleeping, stairs, and transfers  PARTICIPATION LIMITATIONS: meal prep, cleaning, laundry, shopping, community activity, occupation, and yard work  PERSONAL FACTORS: 1-2 comorbidities: Multiple abdominal surgeries, history of kidney stones are also affecting patient's functional outcome.   REHAB POTENTIAL: Good  CLINICAL DECISION MAKING: Evolving/moderate complexity  EVALUATION COMPLEXITY: Moderate   GOALS: Goals reviewed with patient? Yes SHORT TERM GOALS: Target date: 08/02/2023     Patient will increase lumbar flexion to full without pain Baseline: Goal status:  mild pain at end rage 6/3   2.  Patient will increase gross bilateral lower extremity strength by 5 pounds Baseline:  Goal status: achieved in most muscle groups 6/3   3.  Patient will be independent and complaint with basic HEP  Baseline:  Goal status: independent with  base HEP 7/18     LONG TERM GOALS: Target date: 08/30/2023     Patient will return to using the weed wacker without pain  Baseline:  Goal status: improving 6/3    2.  Patient will reach overhead to a shelf without pain  Baseline:  Goal status: improving 6/3   3.  Patient will show compliance to long term program for strengthening  Baseline:  Goal status: ongoing 6/3   PLAN:  PT FREQUENCY: 2x/week  PT DURATION: 8 weeks  PLANNED INTERVENTIONS: 97110-Therapeutic exercises, 97530- Therapeutic  activity, [id number]- Neuromuscular re-education, [id number]- Self Care, [id number]- Manual therapy, [id number]- Gait training, [id number]- Aquatic Therapy, [id number]- Electrical stimulation (unattended), [phone]- Ultrasound, Patient/Family education, Stair training, Taping, Dry  Needling, DME instructions, Cryotherapy, and Moist heat .  PLAN FOR NEXT SESSION: Consider trigger point dry needling of lumbar spine and gluteals.  Soft tissue mobilization to lumbar spine.  Consider LAD. Continue to expand home exercises and work on getting the patient to buy into home exercises.    Alm JINNY Don, PT 10/14/2023, 1:14 PM

## 2023-10-20 DIAGNOSIS — I10 Essential (primary) hypertension: Secondary | ICD-10-CM | POA: Diagnosis not present

## 2023-10-20 DIAGNOSIS — K219 Gastro-esophageal reflux disease without esophagitis: Secondary | ICD-10-CM | POA: Diagnosis not present

## 2023-10-20 DIAGNOSIS — R9389 Abnormal findings on diagnostic imaging of other specified body structures: Secondary | ICD-10-CM | POA: Diagnosis not present

## 2023-10-20 DIAGNOSIS — T17908A Unspecified foreign body in respiratory tract, part unspecified causing other injury, initial encounter: Secondary | ICD-10-CM | POA: Diagnosis not present

## 2023-10-21 ENCOUNTER — Encounter (HOSPITAL_BASED_OUTPATIENT_CLINIC_OR_DEPARTMENT_OTHER): Admitting: Physical Therapy

## 2023-10-25 DIAGNOSIS — K8020 Calculus of gallbladder without cholecystitis without obstruction: Secondary | ICD-10-CM | POA: Diagnosis not present

## 2023-10-25 DIAGNOSIS — R1013 Epigastric pain: Secondary | ICD-10-CM | POA: Diagnosis not present

## 2023-10-25 DIAGNOSIS — K219 Gastro-esophageal reflux disease without esophagitis: Secondary | ICD-10-CM | POA: Diagnosis not present

## 2023-10-25 DIAGNOSIS — K5900 Constipation, unspecified: Secondary | ICD-10-CM | POA: Diagnosis not present

## 2023-10-25 DIAGNOSIS — R748 Abnormal levels of other serum enzymes: Secondary | ICD-10-CM | POA: Diagnosis not present

## 2023-10-28 ENCOUNTER — Encounter (HOSPITAL_BASED_OUTPATIENT_CLINIC_OR_DEPARTMENT_OTHER): Admitting: Physical Therapy

## 2023-10-28 DIAGNOSIS — Z9889 Other specified postprocedural states: Secondary | ICD-10-CM | POA: Diagnosis not present

## 2023-10-28 DIAGNOSIS — M7918 Myalgia, other site: Secondary | ICD-10-CM | POA: Diagnosis not present

## 2023-10-28 DIAGNOSIS — G894 Chronic pain syndrome: Secondary | ICD-10-CM | POA: Diagnosis not present

## 2023-11-04 ENCOUNTER — Encounter (HOSPITAL_BASED_OUTPATIENT_CLINIC_OR_DEPARTMENT_OTHER): Admitting: Physical Therapy

## 2023-11-08 ENCOUNTER — Ambulatory Visit (HOSPITAL_BASED_OUTPATIENT_CLINIC_OR_DEPARTMENT_OTHER): Attending: Family Medicine | Admitting: Physical Therapy

## 2023-11-08 DIAGNOSIS — M5459 Other low back pain: Secondary | ICD-10-CM | POA: Diagnosis not present

## 2023-11-08 DIAGNOSIS — M546 Pain in thoracic spine: Secondary | ICD-10-CM | POA: Diagnosis not present

## 2023-11-08 DIAGNOSIS — R293 Abnormal posture: Secondary | ICD-10-CM | POA: Insufficient documentation

## 2023-11-09 ENCOUNTER — Encounter (HOSPITAL_BASED_OUTPATIENT_CLINIC_OR_DEPARTMENT_OTHER): Payer: Self-pay | Admitting: Physical Therapy

## 2023-11-09 DIAGNOSIS — G894 Chronic pain syndrome: Secondary | ICD-10-CM | POA: Diagnosis not present

## 2023-11-09 DIAGNOSIS — Z9889 Other specified postprocedural states: Secondary | ICD-10-CM | POA: Diagnosis not present

## 2023-11-09 DIAGNOSIS — M7918 Myalgia, other site: Secondary | ICD-10-CM | POA: Diagnosis not present

## 2023-11-09 NOTE — Progress Notes (Signed)
  PAIN TRIGGER POINT INJECTION  Performed by: Shelly Charity, MD Authorized by: Shelly Charity, MD   Pain Procedure:  Pain Trigger Point Injection Muscles: 3+ Muscles    [facility]  PATIENT NAME: Miguel [name] PATIENT MRN: [id number] DATE OF SURGERY: 11/09/2023  TITLE OF PROCEDURE:  1) Trigger point injection     2) Ultrasound for needle guidance and needle localization   SIDE: Left  MUSCLES: External oblique, internal oblique, transversus abdominus muscles, and lumbar paraspinals   PREOPERATIVE DIAGNOSES: Chronic abdominal pain, myofascial pain  POSTOPERATIVE DIAGNOSES:  Same  LOCATION: [facility] [place]  SURGEON: Shelly Charity MD  ASSISTANTS: None  ANESTHESIA:  Local  DESCRIPTION OF OPERATIVE PROCEDURE: Prior to initiation of the procedure, informed consent was obtained, and the patient was made aware of the possibility of pain during the procedure or failure of the procedure to relieve pain. The patient was warned of potential side effect of leg weakness that could occur from local anesthetic spread to the femoral nerve. The patient was brought to the procedure room and positioned to comfort and optimal positioning for procedure.  A time-out was conducted with all members of the care team as per [facility] protocol.  The patient was placed in the supine position and standard prep and drape were performed observing sterile technique.  The patient's left ASIS was palpated and marked. Then a ultrasound probe with sterile probe cover was placed at the medial boarder of the ASIS, aligning the probe parallel to the ASIS and umbilicus. The external oblique, internal oblique, and transversus abdominus muscles were identified. Color doppler was used to ensure there was no major vessels at the planned location of injection. Local anesthetic with 1% lidocaine  was then used to anesthetize the planned site of  injection at the skin and a 25G 2.5in  needle was then guided in an in-plane technique under direct ultrasound guidance to the location of the muscles. After negative aspiration for air or heme, 1.71ml of a injectate of mL 6ml of 0.25% bupivacaine  and 2mg  Decadron  were incrementally injected at each muscle in the abdominal region. Next, the lumbar paraspinals were injected with 1.8ml of the injectate noted above. The patient did not experience any significant pain or paresthesia with injection. The needle was then removed. This was not repeated on the opposite side  The surgical site preparation was washed off of the patient. Hemostasis was appreciated and bandages were applied as needed. The patient tolerated the procedure well and was escorted to the recovery area. The patient was monitored for an appropriate amount of time and standard discharge instructions were reviewed.  The patient knows how to contact the clinic should they have any questions or problems, and he was discharged in stable condition without evidence of a motor block.  Preprocedure pain score: 5/10 Postprocedure pain score: 5/10  COMPLICATIONS: None  EBL: Minimal  PLAN: Return to the clinic for postprocedure follow up in 4-6 weeks

## 2023-11-09 NOTE — Therapy (Signed)
 OUTPATIENT PHYSICAL THERAPY THORACOLUMBAR Progress Note    Patient Name: Miguel [name] MRN: [id number] DOB:01-May-1950, 73 y.o., male Today's Date: 11/09/2023  END OF SESSION:  PT End of Session - 11/09/23 1135     Visit Number 17    Number of Visits 26    Date for PT Re-Evaluation 10/25/23    PT Start Time 1345    PT Stop Time 1428    PT Time Calculation (min) 43 min    Activity Tolerance Patient tolerated treatment well;No increased pain    Behavior During Therapy WFL for tasks assessed/performed                 Past Medical History:  Diagnosis Date   Anemia    Anxiety    BPH (benign prostatic hyperplasia)    Gastritis    GERD (gastroesophageal reflux disease)    seldom (07/16/2016)   GI bleed due to NSAIDs 10/27/2015   History of blood transfusion 06/2016   post OR/notes 07/15/2016   History of hiatal hernia    History of kidney stones    surgery to remove stone   Hyperlipidemia    Hypertension    no meds    Melanoma of back (HCC)    mid back   Sigmoid diverticulitis    with perforation   Past Surgical History:  Procedure Laterality Date   COLON SURGERY     sigmoid   COLOSTOMY TAKEDOWN N/A 06/28/2016   Procedure: COLOSTOMY TAKEDOWN;  Surgeon: Vicenta Poli, MD;  Location: [facility] OR;  Service: General;  Laterality: N/A;   CYSTOSCOPY WITH RETROGRADE PYELOGRAM, URETEROSCOPY AND STENT PLACEMENT Bilateral 02/12/2020   Procedure: CYSTOSCOPY WITH BILATERAL RETROGRADE PYELOGRAM,  AND LITHOPEXY;  Surgeon: Rosalind Zachary NOVAK, MD;  Location: [facility]S;  Service: Urology;  Laterality: Bilateral;  1 HR   ESOPHAGOGASTRODUODENOSCOPY N/A 10/27/2015   Procedure: ESOPHAGOGASTRODUODENOSCOPY (EGD);  Surgeon: Oliva Boots, MD;  Location: [facility] ENDOSCOPY;  Service: Endoscopy;  Laterality: N/A;   ESOPHAGOGASTRODUODENOSCOPY N/A 10/29/2015   Procedure: ESOPHAGOGASTRODUODENOSCOPY (EGD);  Surgeon: Lamar Bunk, MD;  Location: [facility] ENDOSCOPY;  Service: Endoscopy;  Laterality: N/A;    ESOPHAGOGASTRODUODENOSCOPY N/A 10/30/2015   Procedure: ESOPHAGOGASTRODUODENOSCOPY (EGD);  Surgeon: Oliva Boots, MD;  Location: [facility] ENDOSCOPY;  Service: Endoscopy;  Laterality: N/A;   g tube discontinued  04/2020   per patient   GASTROSTOMY TUBE PLACEMENT  11/21/2015   REDUCTION OF HIATAL HERNIA , REPAIR HIATAL HERNIA, RESECTION SMALL BOWEL WITH ANASTOMOSIS, PLACEMENT GASTROSTOMY TUBE, PLACEMENT DUODENOSTOMY TUBE (N/A)   HEMORRHOID BANDING  X 2   HERNIA REPAIR     HIATAL HERNIA REPAIR N/A 11/21/2015   Procedure: REDUCTION OF HIATAL HERNIA , REPAIR HIATAL HERNIA, RESECTION SMALL BOWEL WITH ANASTOMOSIS, PLACEMENT GASTROSTOMY TUBE, PLACEMENT DUODENOSTOMY TUBE;  Surgeon: Herlene Beverley Bureau, MD;  Location: [facility] OR;  Service: General;  Laterality: N/A;   HOLMIUM LASER APPLICATION Right 02/12/2020   Procedure: HOLMIUM LASER APPLICATION;  Surgeon: Rosalind Zachary NOVAK, MD;  Location: [facility];  Service: Urology;  Laterality: Right;   INCISIONAL HERNIA REPAIR  06/28/2016   open/notes 07/15/2016   INCISIONAL HERNIA REPAIR  03/17/2018   WITH MESH   INCISIONAL HERNIA REPAIR N/A 03/17/2018   Procedure: INCISIONAL HERNIA REPAIR WITH MESH;  Surgeon: Poli Vicenta, MD;  Location: [facility] OR;  Service: General;  Laterality: N/A;   INCISIONAL HERNIA REPAIR N/A 10/16/2020   Procedure: SHIRLENE HERNIA REPAIR WITH MESH;  Surgeon: Poli Vicenta, MD;  Location: [facility] OR;  Service: General;  Laterality: N/A;   INGUINAL  HERNIA REPAIR Bilateral 09/28/2018   INGUINAL HERNIA REPAIR Bilateral 09/28/2018   Procedure: BILATERAL OPEN INGUINAL HERNIA REPAIR WITH MESH;  Surgeon: [name], MD;  Location: [facility] OR;  Service: General;  Laterality: Bilateral;  GENERAL AND TAP BLOCK   INSERTION OF MESH N/A 03/17/2018   Procedure: INSERTION OF MESH;  Surgeon: [name], MD;  Location: [facility] OR;  Service: General;  Laterality: N/A;   INSERTION OF MESH Bilateral 09/28/2018   Procedure: Insertion Of Mesh;  Surgeon: [name],  MD;  Location: [facility] OR;  Service: General;  Laterality: Bilateral;   IR CM INJ ANY COLONIC TUBE W/FLUORO  02/04/2017   IR GUIDED DRAIN W CATHETER PLACEMENT  07/06/2016   /NOTES 07/15/2016   IR PATIENT EVAL TECH 0-60 MINS  06/28/2019   IR RADIOLOGIST EVAL & MGMT  07/27/2016   IR RADIOLOGIST EVAL & MGMT  08/17/2016   IR RADIOLOGIST EVAL & MGMT  08/26/2016   IR REPLACE G-TUBE SIMPLE WO FLUORO  07/28/2017   IR REPLACE G-TUBE SIMPLE WO FLUORO  01/31/2018   IR REPLACE G-TUBE SIMPLE WO FLUORO  10/16/2018   IR REPLACE G-TUBE SIMPLE WO FLUORO  03/05/2019   IR REPLACE G-TUBE SIMPLE WO FLUORO  08/22/2019   IR REPLACE G-TUBE SIMPLE WO FLUORO  01/08/2020   IR REPLC GASTRO/COLONIC TUBE PERCUT W/FLUORO  08/04/2016   IR REPLC GASTRO/COLONIC TUBE PERCUT W/FLUORO  01/14/2017   IR US  GUIDE BX ASP/DRAIN  08/04/2016   KNEE CARTILAGE SURGERY Right 1971   opened me up   LAPAROTOMY N/A 07/05/2015   Procedure: PARTIAL SIGMOID COLECTOMY AND COLOSTOMY;  Surgeon: Berg Vernetta, MD;  Location: [facility] OR;  Service: General;  Laterality: N/A;   MELANOMA EXCISION  2001   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  10/30/2015   Procedure: REMOVAL OF DISTAL STOMACH;  Surgeon: Lynwood Pina, MD;  Location: [facility] OR;  Service: General;;   REPAIR OF PERFORATED ULCER N/A 10/30/2015   Procedure: REPAIR OF BLEEDING  ULCER;  Surgeon: Lynwood Pina, MD;  Location: [facility] OR;  Service: General;  Laterality: N/A;   TUMOR EXCISION  2009   back; fatty tumor   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   Bilateral inguinal hernia 09/28/2018   Incisional hernia 03/17/2018   Trigger thumb, left thumb 01/19/2018   Trigger finger, left index finger 07/18/2017   Trigger finger, right ring finger 01/31/2017   Trigger finger of left thumb 01/03/2017   Trigger finger of right thumb 01/03/2017   Trigger index finger of left hand 01/03/2017   Trigger index  finger of right hand 01/03/2017   Malnutrition of moderate degree 07/19/2016   Intra-abdominal abscess (HCC) 07/16/2016   Cellulitis 07/15/2016   S/P colostomy takedown 06/28/2016   Pain in thoracic spine 06/09/2016   Mid back pain 06/09/2016   Postoperative fever 11/19/2015   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   Severe protein-calorie malnutrition (HCC) 11/17/2015   Sepsis (HCC) 11/14/2015   Hiccups 11/14/2015   AKI (acute kidney injury) (HCC) 11/14/2015   Fever    Leg swelling    Left shoulder pain    Muscle spasm of left shoulder    GI bleed 10/27/2015   Acute blood loss anemia 10/27/2015   Syncope 10/27/2015   Hyperglycemia 10/27/2015   Hypotension 10/27/2015   Neck pain 10/27/2015   Hematemesis 10/27/2015   Hematochezia 10/27/2015   Diverticulitis of colon with  perforation 07/05/2015   Progress Note Reporting Period 08/29/2023 to 11/09/2023  See note below for Objective Data and Assessment of Progress/Goals.     PCP: Ray Hutchinson MD   REFERRING PROVIDER: CHERYLE Frees MD   REFERRING DIAG:  DiagnosisM54.50 (ICD-10-CM) - Low back pain, unspecified  Rationale for Evaluation and Treatment: Rehabilitation  THERAPY DIAG:  Other low back pain  Pain in thoracic spine  Abnormal posture  ONSET DATE: 1 month prior   SUBJECTIVE:                                                                                                                                                                                           SUBJECTIVE STATEMENT: The patient reports his back has been pretty good. His liver enzymes have improved.   PERTINENT HISTORY:  Anxiety, anemia, kidney stones,  PAIN:  Are you having pain? Yes: NPRS scale: 3/10 Pain location:left abdomen  Pain description: aching/constant  Aggravating factors: Standing and walking  Relieving factors: rest   PRECAUTIONS: None  RED FLAGS: None   WEIGHT BEARING RESTRICTIONS: No  FALLS:  Has patient fallen in last 6  months? No  LIVING ENVIRONMENT: Nothing pertinent   OCCUPATION:  Retired   Presenter, broadcasting:  Yard work   PLOF: Independent  PATIENT GOALS:  To have less pain To get stronger    NEXT MD VISIT:  Nothing scheduled.   OBJECTIVE:  Note: Objective measures were completed at Evaluation unless otherwise noted.  DIAGNOSTIC FINDINGS:  MRI 2022   IMPRESSION: 1. Multilevel spondylosis of the lumbar spine as described. 2. Mild foraminal narrowing bilaterally at L1-2 and L2-3 is worse on the left. Slight retrolisthesis and uncovering of a broad-based disc protrusion contribute at both levels. 3. Mild central and bilateral foraminal narrowing at L3-4 secondary to a broad-based disc protrusion and facet hypertrophy. 4. Mild central and mild to moderate foraminal narrowing bilaterally at L4-5 is worse right than left. 5. Moderate left and mild right foraminal stenosis at L5-S1 due to endplate and facet spurring. 6. Cholelithiasis without evidence for cholecystitis.  PATIENT SURVEYS:  LEFS 49/80    COGNITION: Overall cognitive status: Within functional limits for tasks assessed     SENSATION: WFL    POSTURE: rounded shoulders, forward head, and flexed trunk   PALPATION: Significant spasming in left paraspinals and gluteal   LUMBAR ROM:   AROM eval 8/13   Flexion Limited 25% with pain  End range without pain   Extension Painful past neutral  Can extend to neutral without pain   Right lateral flexion    Left lateral flexion    Right rotation  Left rotation     (Blank rows = not tested)  LOWER EXTREMITY ROM:     LOWER EXTREMITY MMT:    MMT Right eval Left eval Right 6/2  Left  6/2 Right  8/12 Left 8/12  Hip flexion 17.5 16.3 34.1 33.7 37.8 38.3  Hip extension        Hip abduction 28.7 26.3 37.9 33.0 40.3 37.8  Hip adduction        Hip internal rotation        Hip external rotation        Knee flexion        Knee extension 34.3 32.1 35.1 45.1 38.9 44.6   Ankle dorsiflexion        Ankle plantarflexion        Ankle inversion        Ankle eversion         (Blank rows = not tested)    GAIT: Flexed posture   6/2 Improved posture with gait without cuing    TREATMENT DATE:  8/13  Manual:  Trigger point therapy and STM to lumbar paraspinals and L QL region Thoracic and lumbar PA's grade 2 Skilled palpation of trigger points  There-ex Wand stretch for anterior chest  LF Hip abdcution 70 lbs 3x12  LF triceps press down 3x10 45 lbs  LF row 3x10 44 lbs          Cybex Leg press 3x12 70lbs   7/18  Manual:  Trigger point therapy and STM to lumbar paraspinals and L QL region Thoracic and lumbar PA's grade 2 Skilled palpation of trigger points  There-ex Wand stretch for anterior chest  LF Hip abdcution 70 lbs 3x12  LF triceps press down 3x10 40 lbs  LF row 3x10 40 lbs          Cybex Leg press 3x12 70lbs   7/15  Manual:  Trigger point therapy and STM to lumbar paraspinals and L QL region Thoracic and lumbar PA's grade 2 Skilled palpation of trigger points  There-ex Wand stretch for anterior chest  LF Hip abdcution 70 lbs 3x12  LF triceps press down 3x10 40 lbs  LF row 3x10 40 lbs LF shoulder press 2x10 20 lbs   7/11  Manual:  Trigger point therapy and STM to lumbar paraspinals and L QL region Thoracic and lumbar PA's grade 2 Skilled palpation of trigger points  There-ex Wand stretch for anterior chest  LF Hip abdcution 70 lbs 3x12  LF triceps press down 3x10 40 lbs  LF row 3x10 40 lbs  6/24   Manual:  Trigger point therapy and STM to lumbar paraspinals and L QL region Thoracic and lumbar PA's grade 2 Skilled palpation of trigger points  There-ex:  Cable:  Extensions 3x12 10 lbs   Cybex Row 3x12 20 lbs  LF chest press 3x10  Triceps press down LF 40 lbs 3x12     6/18   Manual:  Trigger point therapy and STM to lumbar paraspinals and L QL region Thoracic and lumbar PA's grade 2 Skilled palpation of  trigger points  Neuro-re-ed  Hip abdcution 70 lbs 3x12 with cuing for posture   Cable:  Row 3x12 20 lbs  Extensions 3x12 10 lbs   Standing flexion and scaption 1lb 2x10 with mirror for visual cues to posture      6/6 Reviewed strength measurements/ hand held dyno testing  Nu-step 5 min  LF triceps press down 3x10 40 lbs  LF row 3x10  40 lbs  Leg rpess 40 lbs 3x12 cybex  Hip abdcution 45 lbs 3x12       Manual:  Trigger point therapy and STM to lumbar paraspinals and L QL region Thoracic and lumbar PA's grade 2 Skilled palpation of trigger points                                                                                                                    PATIENT EDUCATION:  Education details: HEP, symptom management  Person educated: Patient Education method: Explanation, Demonstration, Tactile cues, Verbal cues, and Handouts Education comprehension: verbalized understanding, returned demonstration, verbal cues required, tactile cues required, and needs further education  HOME EXERCISE PROGRAM: Access Code: [email or web address] URL: [email or web address] Date: 07/05/2023 Prepared by: Alm Don  Exercises - Standing Glute Med Mobilization with Small Ball on Wall  - 1 x daily - 7 x weekly - 3 sets - 10 reps - Low Horizontal Abduction with Resistance  - 1 x daily - 7 x weekly - 3 sets - 10 reps - Shoulder External Rotation and Scapular Retraction with Resistance  - 1 x daily - 7 x weekly - 3 sets - 10 reps  ASSESSMENT:  CLINICAL IMPRESSION: Therapy performed a progresses note on the patient today. Overall he is progressing well. His strength and ROM has progressed. He has not been in for a few weeks because of scheduling and GI issues. In that time he has been a little sore but not too bad. We increased his weight with his gym exercises today. He tolerated well> he would benefit from further skilled therapy 1-2 W6 to finalize his HEP.   Patient is a  73 year old male who presents with left-sided low back pain.  Earlier this month he had an acute onset of right-sided low back pain as well.  He had a course of prednisone which resolved this.  At this time he feels like most of his pain is back to being focused on the left side.  Has had success with physical therapy before.  He is not currently doing any of the exercises he was doing before.  He has increased pain with activity.  He has limited lumbar motion as well as limited bilateral lower extremity strength.  He has a long complicated history of abdominal surgeries.  He currently has mesh that causes him pain with activity.  He would benefit from skilled therapy to improve core strength and stability, reduce pain in lower back, and reviewed total program future exacerbations of pain.   OBJECTIVE IMPAIRMENTS: Abnormal gait, decreased activity tolerance, decreased endurance, decreased ROM, decreased strength, improper body mechanics, and postural dysfunction.   ACTIVITY LIMITATIONS: carrying, lifting, bending, standing, squatting, sleeping, stairs, and transfers  PARTICIPATION LIMITATIONS: meal prep, cleaning, laundry, shopping, community activity, occupation, and yard work  PERSONAL FACTORS: 1-2 comorbidities: Multiple abdominal surgeries, history of kidney stones are also affecting patient's functional outcome.   REHAB POTENTIAL: Good  CLINICAL DECISION MAKING: Evolving/moderate complexity  EVALUATION COMPLEXITY: Moderate   GOALS: Goals  reviewed with patient? Yes SHORT TERM GOALS: Target date: 08/02/2023     Patient will increase lumbar flexion to full without pain Baseline: Goal status:  mild pain at end rage 6/3   2.  Patient will increase gross bilateral lower extremity strength by 5 pounds Baseline:  Goal status: achieved in most muscle groups 6/3   3.  Patient will be independent and complaint with basic HEP  Baseline:  Goal status: independent with  base HEP 7/18      LONG TERM GOALS: Target date: 08/30/2023     Patient will return to using the weed wacker without pain  Baseline:  Goal status: improving 6/3    2.  Patient will reach overhead to a shelf without pain  Baseline:  Goal status: improving 6/3   3.  Patient will show compliance to long term program for strengthening  Baseline:  Goal status: ongoing 6/3   PLAN:  PT FREQUENCY: 1-2W 65  PT DURATION: 6 weeks   PLANNED INTERVENTIONS: 97110-Therapeutic exercises, 97530- Therapeutic activity, 97112- Neuromuscular re-education, 97535- Self Care, [id number]- Manual therapy, [id number]- Gait training, [phone]- Aquatic Therapy, [id number]- Electrical stimulation (unattended), 97035- Ultrasound, Patient/Family education, Stair training, Taping, Dry Needling, DME instructions, Cryotherapy, and Moist heat .  PLAN FOR NEXT SESSION: Consider trigger point dry needling of lumbar spine and gluteals.  Soft tissue mobilization to lumbar spine.  Consider LAD. Continue to expand home exercises and work on getting the patient to buy into home exercises.    Alm JINNY Don, PT 11/09/2023, 11:38 AM

## 2023-11-10 NOTE — Telephone Encounter (Signed)
 Post op call made to patient.  Patient denies any nausea, vomiting or fever >101. Injection sites clean. Patient's pre-procedure pain score 5/10, patient's post op pain score 5/10. Patient's pain score currently 3/10. Patient encouraged to call pain services nurse triage line [phone]  if any issues or concerns should arise.   Pt is s/p Trigger point injection with Dr. Shelly Charity on 11/09/23 @ [facility].

## 2023-11-11 ENCOUNTER — Inpatient Hospital Stay (HOSPITAL_BASED_OUTPATIENT_CLINIC_OR_DEPARTMENT_OTHER)
Admission: EM | Admit: 2023-11-11 | Discharge: 2023-11-15 | DRG: 871 | Disposition: A | Discharge status: Home / Self Care | Attending: Family Medicine | Admitting: Family Medicine

## 2023-11-11 ENCOUNTER — Encounter (HOSPITAL_BASED_OUTPATIENT_CLINIC_OR_DEPARTMENT_OTHER): Payer: Self-pay

## 2023-11-11 ENCOUNTER — Encounter (HOSPITAL_BASED_OUTPATIENT_CLINIC_OR_DEPARTMENT_OTHER): Admitting: Physical Therapy

## 2023-11-11 DIAGNOSIS — A419 Sepsis, unspecified organism: Secondary | ICD-10-CM | POA: Diagnosis not present

## 2023-11-11 DIAGNOSIS — R4182 Altered mental status, unspecified: Secondary | ICD-10-CM | POA: Diagnosis not present

## 2023-11-11 DIAGNOSIS — R6520 Severe sepsis without septic shock: Secondary | ICD-10-CM | POA: Diagnosis not present

## 2023-11-11 DIAGNOSIS — Z79899 Other long term (current) drug therapy: Secondary | ICD-10-CM

## 2023-11-11 DIAGNOSIS — Z87442 Personal history of urinary calculi: Secondary | ICD-10-CM | POA: Diagnosis not present

## 2023-11-11 DIAGNOSIS — Z8249 Family history of ischemic heart disease and other diseases of the circulatory system: Secondary | ICD-10-CM

## 2023-11-11 DIAGNOSIS — N400 Enlarged prostate without lower urinary tract symptoms: Secondary | ICD-10-CM | POA: Diagnosis not present

## 2023-11-11 DIAGNOSIS — K859 Acute pancreatitis without necrosis or infection, unspecified: Secondary | ICD-10-CM | POA: Diagnosis not present

## 2023-11-11 DIAGNOSIS — K8020 Calculus of gallbladder without cholecystitis without obstruction: Secondary | ICD-10-CM | POA: Diagnosis not present

## 2023-11-11 DIAGNOSIS — I700 Atherosclerosis of aorta: Secondary | ICD-10-CM | POA: Diagnosis not present

## 2023-11-11 DIAGNOSIS — E785 Hyperlipidemia, unspecified: Secondary | ICD-10-CM | POA: Diagnosis present

## 2023-11-11 DIAGNOSIS — D649 Anemia, unspecified: Secondary | ICD-10-CM | POA: Diagnosis present

## 2023-11-11 DIAGNOSIS — K8000 Calculus of gallbladder with acute cholecystitis without obstruction: Secondary | ICD-10-CM | POA: Diagnosis present

## 2023-11-11 DIAGNOSIS — N281 Cyst of kidney, acquired: Secondary | ICD-10-CM | POA: Diagnosis not present

## 2023-11-11 DIAGNOSIS — K219 Gastro-esophageal reflux disease without esophagitis: Secondary | ICD-10-CM | POA: Diagnosis present

## 2023-11-11 DIAGNOSIS — Z8711 Personal history of peptic ulcer disease: Secondary | ICD-10-CM

## 2023-11-11 DIAGNOSIS — R918 Other nonspecific abnormal finding of lung field: Secondary | ICD-10-CM | POA: Diagnosis not present

## 2023-11-11 DIAGNOSIS — I10 Essential (primary) hypertension: Secondary | ICD-10-CM | POA: Diagnosis present

## 2023-11-11 DIAGNOSIS — Z1152 Encounter for screening for COVID-19: Secondary | ICD-10-CM | POA: Diagnosis not present

## 2023-11-11 DIAGNOSIS — R7401 Elevation of levels of liver transaminase levels: Secondary | ICD-10-CM | POA: Diagnosis not present

## 2023-11-11 DIAGNOSIS — K8591 Acute pancreatitis with uninfected necrosis, unspecified: Secondary | ICD-10-CM | POA: Diagnosis not present

## 2023-11-11 DIAGNOSIS — R1084 Generalized abdominal pain: Secondary | ICD-10-CM | POA: Diagnosis not present

## 2023-11-11 DIAGNOSIS — K439 Ventral hernia without obstruction or gangrene: Secondary | ICD-10-CM | POA: Diagnosis not present

## 2023-11-11 DIAGNOSIS — Z87891 Personal history of nicotine dependence: Secondary | ICD-10-CM

## 2023-11-11 DIAGNOSIS — Z85820 Personal history of malignant melanoma of skin: Secondary | ICD-10-CM

## 2023-11-11 DIAGNOSIS — G8929 Other chronic pain: Secondary | ICD-10-CM | POA: Diagnosis not present

## 2023-11-11 DIAGNOSIS — R0902 Hypoxemia: Secondary | ICD-10-CM | POA: Diagnosis not present

## 2023-11-11 DIAGNOSIS — J189 Pneumonia, unspecified organism: Secondary | ICD-10-CM | POA: Diagnosis present

## 2023-11-11 DIAGNOSIS — K851 Biliary acute pancreatitis without necrosis or infection: Secondary | ICD-10-CM | POA: Diagnosis not present

## 2023-11-11 DIAGNOSIS — K838 Other specified diseases of biliary tract: Secondary | ICD-10-CM | POA: Diagnosis not present

## 2023-11-11 DIAGNOSIS — Z823 Family history of stroke: Secondary | ICD-10-CM

## 2023-11-11 DIAGNOSIS — R1011 Right upper quadrant pain: Secondary | ICD-10-CM | POA: Diagnosis not present

## 2023-11-11 DIAGNOSIS — R000 Tachycardia, unspecified: Secondary | ICD-10-CM | POA: Diagnosis not present

## 2023-11-11 NOTE — ED Triage Notes (Signed)
 Pt BIB wife from home due to shaking. Per wife patient is more altered than usual. Pt reports it was a sudden onset 1hr PTA.

## 2023-11-12 ENCOUNTER — Other Ambulatory Visit: Payer: Self-pay

## 2023-11-12 ENCOUNTER — Inpatient Hospital Stay (HOSPITAL_COMMUNITY)

## 2023-11-12 ENCOUNTER — Emergency Department (HOSPITAL_BASED_OUTPATIENT_CLINIC_OR_DEPARTMENT_OTHER)

## 2023-11-12 DIAGNOSIS — R7401 Elevation of levels of liver transaminase levels: Secondary | ICD-10-CM | POA: Diagnosis present

## 2023-11-12 DIAGNOSIS — I10 Essential (primary) hypertension: Secondary | ICD-10-CM | POA: Diagnosis present

## 2023-11-12 DIAGNOSIS — N400 Enlarged prostate without lower urinary tract symptoms: Secondary | ICD-10-CM | POA: Diagnosis present

## 2023-11-12 DIAGNOSIS — R4182 Altered mental status, unspecified: Secondary | ICD-10-CM | POA: Diagnosis not present

## 2023-11-12 DIAGNOSIS — Z1152 Encounter for screening for COVID-19: Secondary | ICD-10-CM | POA: Diagnosis not present

## 2023-11-12 DIAGNOSIS — K8020 Calculus of gallbladder without cholecystitis without obstruction: Secondary | ICD-10-CM | POA: Diagnosis not present

## 2023-11-12 DIAGNOSIS — Z8249 Family history of ischemic heart disease and other diseases of the circulatory system: Secondary | ICD-10-CM | POA: Diagnosis not present

## 2023-11-12 DIAGNOSIS — K8000 Calculus of gallbladder with acute cholecystitis without obstruction: Secondary | ICD-10-CM | POA: Diagnosis present

## 2023-11-12 DIAGNOSIS — Z79899 Other long term (current) drug therapy: Secondary | ICD-10-CM | POA: Diagnosis not present

## 2023-11-12 DIAGNOSIS — G8929 Other chronic pain: Secondary | ICD-10-CM | POA: Diagnosis present

## 2023-11-12 DIAGNOSIS — J189 Pneumonia, unspecified organism: Secondary | ICD-10-CM

## 2023-11-12 DIAGNOSIS — Z85820 Personal history of malignant melanoma of skin: Secondary | ICD-10-CM | POA: Diagnosis not present

## 2023-11-12 DIAGNOSIS — Z8711 Personal history of peptic ulcer disease: Secondary | ICD-10-CM | POA: Diagnosis not present

## 2023-11-12 DIAGNOSIS — K219 Gastro-esophageal reflux disease without esophagitis: Secondary | ICD-10-CM | POA: Diagnosis present

## 2023-11-12 DIAGNOSIS — I700 Atherosclerosis of aorta: Secondary | ICD-10-CM | POA: Diagnosis present

## 2023-11-12 DIAGNOSIS — A419 Sepsis, unspecified organism: Secondary | ICD-10-CM | POA: Diagnosis present

## 2023-11-12 DIAGNOSIS — R0902 Hypoxemia: Secondary | ICD-10-CM | POA: Diagnosis present

## 2023-11-12 DIAGNOSIS — Z87442 Personal history of urinary calculi: Secondary | ICD-10-CM | POA: Diagnosis not present

## 2023-11-12 DIAGNOSIS — Z823 Family history of stroke: Secondary | ICD-10-CM | POA: Diagnosis not present

## 2023-11-12 DIAGNOSIS — R6520 Severe sepsis without septic shock: Secondary | ICD-10-CM | POA: Diagnosis present

## 2023-11-12 DIAGNOSIS — E785 Hyperlipidemia, unspecified: Secondary | ICD-10-CM | POA: Diagnosis present

## 2023-11-12 DIAGNOSIS — K8591 Acute pancreatitis with uninfected necrosis, unspecified: Secondary | ICD-10-CM | POA: Diagnosis not present

## 2023-11-12 DIAGNOSIS — K851 Biliary acute pancreatitis without necrosis or infection: Secondary | ICD-10-CM | POA: Diagnosis present

## 2023-11-12 DIAGNOSIS — Z87891 Personal history of nicotine dependence: Secondary | ICD-10-CM | POA: Diagnosis not present

## 2023-11-12 DIAGNOSIS — D649 Anemia, unspecified: Secondary | ICD-10-CM | POA: Diagnosis present

## 2023-11-12 DIAGNOSIS — R918 Other nonspecific abnormal finding of lung field: Secondary | ICD-10-CM | POA: Diagnosis not present

## 2023-11-12 LAB — COMPREHENSIVE METABOLIC PANEL WITH GFR
ALT: 263 U/L — ABNORMAL HIGH (ref 0–44)
AST: 121 U/L — ABNORMAL HIGH (ref 15–41)
Albumin: 4 g/dL (ref 3.5–5.0)
Alkaline Phosphatase: 1409 U/L — ABNORMAL HIGH (ref 38–126)
Anion gap: 15 (ref 5–15)
BUN: 17 mg/dL (ref 8–23)
CO2: 21 mmol/L — ABNORMAL LOW (ref 22–32)
Calcium: 9.3 mg/dL (ref 8.9–10.3)
Chloride: 100 mmol/L (ref 98–111)
Creatinine, Ser: 0.99 mg/dL (ref 0.61–1.24)
GFR, Estimated: >60 mL/min (ref >60)
Glucose, Bld: 101 mg/dL — ABNORMAL HIGH (ref 70–99)
Potassium: 4.3 mmol/L (ref 3.5–5.1)
Sodium: 136 mmol/L (ref 135–145)
Total Bilirubin: 4.3 mg/dL — ABNORMAL HIGH (ref 0.0–1.2)
Total Protein: 6.9 g/dL (ref 6.5–8.1)

## 2023-11-12 LAB — CBC WITH DIFFERENTIAL/PLATELET
Abs Immature Granulocytes: 0.02 K/uL (ref 0.00–0.07)
Basophils Absolute: 0 K/uL (ref 0.0–0.1)
Basophils Relative: 0 %
Eosinophils Absolute: 0 K/uL (ref 0.0–0.5)
Eosinophils Relative: 0 %
HCT: 37.4 % — ABNORMAL LOW (ref 39.0–52.0)
Hemoglobin: 12.4 g/dL — ABNORMAL LOW (ref 13.0–17.0)
Immature Granulocytes: 0 %
Lymphocytes Relative: 11 %
Lymphs Abs: 1.2 K/uL (ref 0.7–4.0)
MCH: 31.1 pg (ref 26.0–34.0)
MCHC: 33.2 g/dL (ref 30.0–36.0)
MCV: 93.7 fL (ref 80.0–100.0)
Monocytes Absolute: 0.4 K/uL (ref 0.1–1.0)
Monocytes Relative: 3 %
Neutro Abs: 9.6 K/uL — ABNORMAL HIGH (ref 1.7–7.7)
Neutrophils Relative %: 86 %
Platelets: 268 K/uL (ref 150–400)
RBC: 3.99 MIL/uL — ABNORMAL LOW (ref 4.22–5.81)
RDW: 15 % (ref 11.5–15.5)
WBC: 11.3 K/uL — ABNORMAL HIGH (ref 4.0–10.5)
nRBC: 0 % (ref 0.0–0.2)

## 2023-11-12 LAB — LIPASE, BLOOD: Lipase: 1124 U/L — ABNORMAL HIGH (ref 11–51)

## 2023-11-12 LAB — URINALYSIS, W/ REFLEX TO CULTURE (INFECTION SUSPECTED)
Bacteria, UA: NONE SEEN
Glucose, UA: NEGATIVE mg/dL
Hgb urine dipstick: NEGATIVE
Ketones, ur: NEGATIVE mg/dL
Leukocytes,Ua: NEGATIVE
Nitrite: NEGATIVE
Specific Gravity, Urine: 1.02 (ref 1.005–1.030)
pH: 5.5 (ref 5.0–8.0)

## 2023-11-12 LAB — LACTIC ACID, PLASMA
Lactic Acid, Venous: 0.8 mmol/L (ref 0.5–1.9)
Lactic Acid, Venous: 1.5 mmol/L (ref 0.5–1.9)

## 2023-11-12 LAB — PROTIME-INR
INR: 1 (ref 0.8–1.2)
Prothrombin Time: 13.7 s (ref 11.4–15.2)

## 2023-11-12 LAB — CBG MONITORING, ED: Glucose-Capillary: 104 mg/dL — ABNORMAL HIGH (ref 70–99)

## 2023-11-12 LAB — RESP PANEL BY RT-PCR (RSV, FLU A&B, COVID)  RVPGX2
Influenza A by PCR: NEGATIVE
Influenza B by PCR: NEGATIVE
Resp Syncytial Virus by PCR: NEGATIVE
SARS Coronavirus 2 by RT PCR: NEGATIVE

## 2023-11-12 MED ORDER — LACTATED RINGERS IV BOLUS (SEPSIS)
1000.0000 mL | Freq: Once | INTRAVENOUS | Status: AC
  Administered 2023-11-12: 1000 mL via INTRAVENOUS

## 2023-11-12 MED ORDER — FINASTERIDE 5 MG PO TABS
5.0000 mg | ORAL_TABLET | Freq: Every day | ORAL | Status: DC
  Administered 2023-11-12 – 2023-11-15 (×4): 5 mg via ORAL
  Filled 2023-11-12 (×4): qty 1

## 2023-11-12 MED ORDER — IOHEXOL 300 MG/ML  SOLN
80.0000 mL | Freq: Once | INTRAMUSCULAR | Status: AC | PRN
  Administered 2023-11-12: 80 mL via INTRAVENOUS

## 2023-11-12 MED ORDER — SODIUM CHLORIDE 0.9 % IV SOLN
2.0000 g | Freq: Once | INTRAVENOUS | Status: AC
  Administered 2023-11-12: 2 g via INTRAVENOUS
  Filled 2023-11-12: qty 12.5

## 2023-11-12 MED ORDER — GABAPENTIN 100 MG PO CAPS
100.0000 mg | ORAL_CAPSULE | Freq: Three times a day (TID) | ORAL | Status: DC
  Administered 2023-11-12 – 2023-11-15 (×9): 100 mg via ORAL
  Filled 2023-11-12 (×9): qty 1

## 2023-11-12 MED ORDER — ACETAMINOPHEN 325 MG PO TABS
650.0000 mg | ORAL_TABLET | Freq: Four times a day (QID) | ORAL | Status: DC | PRN
  Administered 2023-11-12 (×2): 650 mg via ORAL
  Filled 2023-11-12 (×2): qty 2

## 2023-11-12 MED ORDER — ZOLPIDEM TARTRATE 5 MG PO TABS
10.0000 mg | ORAL_TABLET | Freq: Every evening | ORAL | Status: DC | PRN
  Administered 2023-11-12 – 2023-11-13 (×2): 10 mg via ORAL
  Filled 2023-11-12 (×4): qty 2

## 2023-11-12 MED ORDER — METRONIDAZOLE 500 MG/100ML IV SOLN
500.0000 mg | Freq: Once | INTRAVENOUS | Status: AC
  Administered 2023-11-12: 500 mg via INTRAVENOUS
  Filled 2023-11-12: qty 100

## 2023-11-12 MED ORDER — OXYCODONE HCL 5 MG PO TABS
5.0000 mg | ORAL_TABLET | ORAL | Status: DC | PRN
  Administered 2023-11-12 – 2023-11-15 (×10): 5 mg via ORAL
  Filled 2023-11-12 (×11): qty 1

## 2023-11-12 MED ORDER — DIAZEPAM 2 MG PO TABS
2.0000 mg | ORAL_TABLET | Freq: Once | ORAL | Status: AC
  Administered 2023-11-12: 2 mg via ORAL
  Filled 2023-11-12: qty 1

## 2023-11-12 MED ORDER — TRAZODONE HCL 50 MG PO TABS: 25.0000 mg | ORAL_TABLET | Freq: Every evening | ORAL | Status: DC | PRN

## 2023-11-12 MED ORDER — ACETAMINOPHEN 325 MG PO TABS
650.0000 mg | ORAL_TABLET | Freq: Once | ORAL | Status: AC
  Administered 2023-11-12: 650 mg via ORAL
  Filled 2023-11-12: qty 2

## 2023-11-12 MED ORDER — ONDANSETRON HCL 4 MG PO TABS: 4.0000 mg | ORAL_TABLET | Freq: Four times a day (QID) | ORAL | Status: DC | PRN

## 2023-11-12 MED ORDER — LACTATED RINGERS IV SOLN: INTRAVENOUS | Status: AC

## 2023-11-12 MED ORDER — SODIUM CHLORIDE 0.9 % IV SOLN
500.0000 mg | INTRAVENOUS | Status: DC
  Administered 2023-11-12 – 2023-11-15 (×4): 500 mg via INTRAVENOUS
  Filled 2023-11-12 (×4): qty 5

## 2023-11-12 MED ORDER — ALBUTEROL SULFATE (2.5 MG/3ML) 0.083% IN NEBU: 2.5000 mg | INHALATION_SOLUTION | RESPIRATORY_TRACT | Status: DC | PRN

## 2023-11-12 MED ORDER — ONDANSETRON HCL 4 MG/2ML IJ SOLN
4.0000 mg | Freq: Once | INTRAMUSCULAR | Status: AC
  Administered 2023-11-12: 4 mg via INTRAVENOUS
  Filled 2023-11-12: qty 2

## 2023-11-12 MED ORDER — HYDROMORPHONE HCL 1 MG/ML IJ SOLN
1.0000 mg | INTRAMUSCULAR | Status: DC | PRN
  Administered 2023-11-13 – 2023-11-15 (×7): 1 mg via INTRAVENOUS
  Filled 2023-11-12 (×7): qty 1

## 2023-11-12 MED ORDER — GADOBUTROL 1 MMOL/ML IV SOLN
6.0000 mL | Freq: Once | INTRAVENOUS | Status: AC | PRN
  Administered 2023-11-12: 6 mL via INTRAVENOUS

## 2023-11-12 MED ORDER — AMLODIPINE BESYLATE 5 MG PO TABS
5.0000 mg | ORAL_TABLET | Freq: Every day | ORAL | Status: DC
  Administered 2023-11-12 – 2023-11-15 (×4): 5 mg via ORAL
  Filled 2023-11-12 (×4): qty 1

## 2023-11-12 MED ORDER — LORAZEPAM 0.5 MG PO TABS: 0.5000 mg | ORAL_TABLET | Freq: Every day | ORAL | Status: DC | PRN

## 2023-11-12 MED ORDER — ENOXAPARIN SODIUM 40 MG/0.4ML IJ SOSY
40.0000 mg | PREFILLED_SYRINGE | INTRAMUSCULAR | Status: DC
  Administered 2023-11-12 – 2023-11-14 (×3): 40 mg via SUBCUTANEOUS
  Filled 2023-11-12 (×3): qty 0.4

## 2023-11-12 MED ORDER — VANCOMYCIN HCL IN DEXTROSE 1-5 GM/200ML-% IV SOLN
1000.0000 mg | Freq: Once | INTRAVENOUS | Status: AC
  Administered 2023-11-12: 1000 mg via INTRAVENOUS
  Filled 2023-11-12: qty 200

## 2023-11-12 MED ORDER — ONDANSETRON HCL 4 MG/2ML IJ SOLN: 4.0000 mg | Freq: Four times a day (QID) | INTRAMUSCULAR | Status: DC | PRN

## 2023-11-12 MED ORDER — ALUM & MAG HYDROXIDE-SIMETH 200-200-20 MG/5ML PO SUSP
15.0000 mL | Freq: Four times a day (QID) | ORAL | Status: DC | PRN
  Administered 2023-11-12: 15 mL via ORAL
  Filled 2023-11-12: qty 30

## 2023-11-12 MED ORDER — SODIUM CHLORIDE 0.9 % IV SOLN
1.0000 g | INTRAVENOUS | Status: DC
  Administered 2023-11-12 – 2023-11-15 (×4): 1 g via INTRAVENOUS
  Filled 2023-11-12 (×5): qty 10

## 2023-11-12 MED ORDER — PANTOPRAZOLE SODIUM 20 MG PO TBEC
20.0000 mg | DELAYED_RELEASE_TABLET | Freq: Every day | ORAL | Status: DC
  Administered 2023-11-12 – 2023-11-15 (×4): 20 mg via ORAL
  Filled 2023-11-12 (×4): qty 1

## 2023-11-12 MED ORDER — ACETAMINOPHEN 650 MG RE SUPP: 650.0000 mg | Freq: Four times a day (QID) | RECTAL | Status: DC | PRN

## 2023-11-12 NOTE — Progress Notes (Signed)
 PHARMACY - PHYSICIAN COMMUNICATION CRITICAL VALUE ALERT - BLOOD CULTURE IDENTIFICATION (BCID)  Miguel [name] is an 73 y.o. male who presented to [facility] on 11/11/2023 with a chief complaint of altered mental status and shaking.  Assessment:  CT chest/abdomen + PNA & pancreatitis. VSS at present.  1/4 Blood cx bottles + GPR; BCID not performed/does not speciate GPR. Possible contaminant  Name of physician (or Provider) Contacted: JINNY Kipper, NP  Current antibiotics: Rocephin  + Zithromax   Changes to prescribed antibiotics recommended:  Continue current antibiotics; will not narrow based on current information  No results found for this or any previous visit.  Rosaline Millet PharmD 11/12/2023  11:56 PM

## 2023-11-12 NOTE — ED Provider Notes (Signed)
  EMERGENCY DEPARTMENT AT [facility] Provider Note   CSN: [id number] Arrival date & time: 11/11/23  2349     Patient presents with: Altered Mental Status   Miguel [name] is a 73 y.o. male.   Presents to the emergency departments accompanied by wife.  He was reportedly feeling well today, went to bed tonight.  He called out for his wife and she found him sweaty, shaking and disoriented.       Prior to Admission medications   Medication Sig Start Date End Date Taking? Authorizing Provider  acetaminophen  (TYLENOL ) 500 MG tablet Take 1 tablet (500 mg total) by mouth every 8 (eight) hours as needed for mild pain (for pain). Patient taking differently: Take 250-500 mg by mouth every 4 (four) hours as needed for mild pain (pain score 1-3) or fever. 12/05/15   [name], PA-C  diazepam  (VALIUM ) 5 MG tablet Take one tablet by mouth with food one hour prior to procedure. May repeat 30 minutes prior if needed. 04/14/21   [name], [name], NP  diclofenac sodium (VOLTAREN) 1 % GEL Apply 2 g topically 3 (three) times daily as needed (joint pain).     [provider]  famotidine  (PEPCID ) 20 MG tablet Take 20 mg by mouth 2 (two) times daily.     [provider]  HYDROcodone -acetaminophen  (NORCO/VICODIN) 5-325 MG tablet Take 1 tablet by mouth 2 (two) times daily as needed for moderate pain. 07/06/21   [name], MD  LORazepam  (ATIVAN ) 1 MG tablet Take 0.5 mg by mouth daily as needed for anxiety. 05/29/18   [provider]  methocarbamol  (ROBAXIN ) 500 MG tablet Take 250-500 mg by mouth every 8 (eight) hours as needed for muscle spasms.  07/11/18   [provider]  methylPREDNISolone  (MEDROL ) 4 MG tablet Medrol  dose pack. Take as instructed 11/24/20   [name], MD  Multiple Vitamin (MULTIVITAMIN WITH MINERALS) TABS tablet Take 1 tablet by mouth daily.    [provider]  ondansetron  (ZOFRAN -ODT) 4 MG  disintegrating tablet Take 4 mg by mouth every 6 (six) hours as needed for nausea/vomiting. 07/11/18   [provider]  oxyCODONE  (OXY IR/ROXICODONE ) 5 MG immediate release tablet Take 1 tablet (5 mg total) by mouth every 6 (six) hours as needed for moderate pain, severe pain or breakthrough pain. 10/20/20   [name], MD  oxyCODONE  (OXY IR/ROXICODONE ) 5 MG immediate release tablet Take 1 tablet (5 mg total) by mouth every 8 (eight) hours as needed. 01/17/23     pantoprazole  (PROTONIX ) 40 MG tablet Take 40 mg by mouth 2 (two) times daily. 01/18/18   [provider]  polyethylene glycol (MIRALAX / GLYCOLAX) 17 g packet Take 17 g by mouth daily as needed for mild constipation.    [provider]  Probiotic Product (PROBIOTIC DAILY PO) Take 1 capsule by mouth daily.    [provider]  tamsulosin  (FLOMAX ) 0.4 MG CAPS capsule Take 1 capsule (0.4 mg total) by mouth daily. 01/04/16   [name], [name], MD  tiZANidine (ZANAFLEX) 2 MG tablet Take 1-2 mg by mouth 2 (two) times daily as needed for muscle spasms. 01/16/20   [provider]  traMADol  (ULTRAM ) 50 MG tablet Take 50 mg by mouth every 6 (six) hours as needed for moderate pain. 05/21/16   [provider]  vitamin B-12 (CYANOCOBALAMIN) 1000 MCG tablet Take 1,000 mcg by mouth daily.     [provider]  zolpidem  (AMBIEN ) 10  MG tablet Take 10 mg by mouth at bedtime.    [provider]    Allergies: Patient has no known allergies.    Review of Systems  Updated Vital Signs BP (!) 108/59   Pulse 98   Temp 99.4 F (37.4 C) (Oral)   Resp (!) 21   Ht 5' 5 (1.651 m)   Wt 62.6 kg Comment: Wt from 11/09/2023  SpO2 95%   BMI 22.97 kg/m   Physical Exam Vitals and nursing note reviewed.  Constitutional:      General: He is in acute distress.     Appearance: He is well-developed. He is ill-appearing.  HENT:     Head: Normocephalic and atraumatic.     Mouth/Throat:      Mouth: Mucous membranes are moist.  Eyes:     General: Vision grossly intact. Gaze aligned appropriately.     Extraocular Movements: Extraocular movements intact.     Conjunctiva/sclera: Conjunctivae normal.  Cardiovascular:     Rate and Rhythm: Regular rhythm. Tachycardia present.     Pulses: Normal pulses.     Heart sounds: Normal heart sounds, S1 normal and S2 normal. No murmur heard.    No friction rub. No gallop.  Pulmonary:     Effort: Pulmonary effort is normal. No respiratory distress.     Breath sounds: Normal breath sounds.  Abdominal:     Palpations: Abdomen is soft.     Tenderness: There is generalized abdominal tenderness. There is no guarding or rebound.     Hernia: No hernia is present.  Musculoskeletal:        General: No swelling.     Cervical back: Full passive range of motion without pain, normal range of motion and neck supple. No pain with movement, spinous process tenderness or muscular tenderness. Normal range of motion.     Right lower leg: No edema.     Left lower leg: No edema.  Skin:    General: Skin is warm and dry.     Capillary Refill: Capillary refill takes less than 2 seconds.     Findings: No ecchymosis, erythema, lesion or wound.  Neurological:     Mental Status: He is alert and oriented to person, place, and time.     GCS: GCS eye subscore is 4. GCS verbal subscore is 5. GCS motor subscore is 6.     Cranial Nerves: Cranial nerves 2-12 are intact.     Sensory: Sensation is intact.     Motor: Motor function is intact. No weakness or abnormal muscle tone.     Coordination: Coordination is intact.  Psychiatric:        Mood and Affect: Mood normal.        Speech: Speech normal.        Behavior: Behavior normal.     (all labs ordered are listed, but only abnormal results are displayed) Labs Reviewed  COMPREHENSIVE METABOLIC PANEL WITH GFR - Abnormal; Notable for the following components:      Result Value   CO2 21 (*)    Glucose, Bld 101 (*)     AST 121 (*)    ALT 263 (*)    Alkaline Phosphatase 1,409 (*)    Total Bilirubin 4.3 (*)    All other components within normal limits  CBC WITH DIFFERENTIAL/PLATELET - Abnormal; Notable for the following components:   WBC 11.3 (*)    RBC 3.99 (*)    Hemoglobin 12.4 (*)    HCT 37.4 (*)  Neutro Abs 9.6 (*)    All other components within normal limits  URINALYSIS, W/ REFLEX TO CULTURE (INFECTION SUSPECTED) - Abnormal; Notable for the following components:   Bilirubin Urine SMALL (*)    Protein, ur TRACE (*)    All other components within normal limits  LIPASE, BLOOD - Abnormal; Notable for the following components:   Lipase 1,124 (*)    All other components within normal limits  CBG MONITORING, ED - Abnormal; Notable for the following components:   Glucose-Capillary 104 (*)    All other components within normal limits  RESP PANEL BY RT-PCR (RSV, FLU A&B, COVID)  RVPGX2  CULTURE, BLOOD (ROUTINE X 2)  CULTURE, BLOOD (ROUTINE X 2)  LACTIC ACID, PLASMA  PROTIME-INR  LACTIC ACID, PLASMA    EKG: EKG Interpretation Date/Time:  Saturday November 12 2023 00:05:33 EDT Ventricular Rate:  150 PR Interval:  110 QRS Duration:  125 QT Interval:  313 QTC Calculation: 495 R Axis:   188  Text Interpretation: Wide-QRS tachycardia Right bundle branch block Baseline wander in lead(s) V1 Confirmed by Haze Lonni PARAS [phone]) on 11/12/2023 12:09:53 AM  Radiology: CT CHEST ABDOMEN PELVIS W CONTRAST Result Date: 11/12/2023 CLINICAL DATA:  Sepsis EXAM: CT CHEST, ABDOMEN, AND PELVIS WITH CONTRAST TECHNIQUE: Multidetector CT imaging of the chest, abdomen and pelvis was performed following the standard protocol during bolus administration of intravenous contrast. RADIATION DOSE REDUCTION: This exam was performed according to the departmental dose-optimization program which includes automated exposure control, adjustment of the mA and/or kV according to patient size and/or use of iterative  reconstruction technique. CONTRAST:  80mL OMNIPAQUE  IOHEXOL  300 MG/ML  SOLN COMPARISON:  Same day radiograph; CT chest 10/07/2023; CT abdomen pelvis 09/30/2023 FINDINGS: CT CHEST FINDINGS Cardiovascular: No pericardial effusion. Normal caliber thoracic aorta. Coronary artery and aortic atherosclerotic calcification. Mediastinum/Nodes: Trachea is unremarkable. Wall thickening about the distal esophagus. No pathologic adenopathy. Lungs/Pleura: Consolidative and ground-glass opacities right upper and middle lobes along the minor fissure and right lower lobe. This is compatible with multifocal pneumonia. Follow-up in 8-10 weeks is recommended to ensure resolution. No pleural effusion or pneumothorax. Musculoskeletal: No acute fracture. CT ABDOMEN PELVIS FINDINGS Hepatobiliary: Periportal edema. The liver is otherwise unremarkable. Distended gallbladder with multiple stones. Mild gallbladder wall thickening. No biliary dilation. Pancreas: Edema and stranding about the pancreas compatible with pancreatitis. No ductal dilation no organized fluid collection. No evidence of pancreatic necrosis. Spleen: Unremarkable. Adrenals/Urinary Tract: Stable adrenal glands. No urinary calculi or hydronephrosis. Scarring in the right kidney. Chronic cystic dilation of the distal ureters just proximal to the UVJ is bilaterally is unchanged. Unremarkable bladder. Stomach/Bowel: No bowel obstruction. Postoperative change about the stomach. Postoperative change about the colon in the pelvis. Wall thickening about the stomach likely reactive secondary to pancreatitis. Vascular/Lymphatic: Mild aortic atherosclerotic calcification. 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 No lymphadenopathy. Reproductive: Enlarged prostate. Other: No free intraperitoneal fluid or air. Musculoskeletal: No acute fracture. IMPRESSION: 1. Acute pancreatitis. No evidence of pancreatic necrosis or organized fluid collection. 2. Multifocal pneumonia in  the right lung. Follow-up in 8-10 weeks is recommended to ensure resolution. 3. Distended gallbladder with multiple stones and mild gallbladder wall thickening. Findings likely secondary to pancreatitis. 4. Wall thickening about the distal esophagus and stomach likely reactive secondary to pancreatitis. 5. Aortic Atherosclerosis (ICD10-I70.0). Electronically Signed   By: Norman Gatlin M.D.   On: 11/12/2023 02:11   CT HEAD WO CONTRAST ( ) Result Date: 11/12/2023 EXAM: CT HEAD WITHOUT CONTRAST 11/12/2023 12:45:51 AM TECHNIQUE:  CT of the head was performed without the administration of intravenous contrast. Automated exposure control, iterative reconstruction, and/or weight based adjustment of the mA/kV was utilized to reduce the radiation dose to as low as reasonably achievable. COMPARISON: 01/14/2023 CLINICAL HISTORY: Mental status change, unknown cause. Pt BIB wife from home due to shaking. Per wife patient is more altered than usual. Pt reports it was a sudden onset 1hr PTA. FINDINGS: BRAIN AND VENTRICLES: No acute hemorrhage. Gray-white differentiation is preserved. No hydrocephalus. No extra-axial collection. No mass effect or midline shift. ORBITS: No acute abnormality. SINUSES: No acute abnormality. SOFT TISSUES AND SKULL: No acute soft tissue abnormality. No skull fracture. IMPRESSION: 1. No acute intracranial abnormality. Electronically signed by: Franky Stanford MD 11/12/2023 01:07 AM EDT RP Workstation: [id number]   DG Chest Port 1 View Result Date: 11/12/2023 CLINICAL DATA:  Questionable sepsis - evaluate for abnormality. Altered mental status. EXAM: PORTABLE CHEST 1 VIEW COMPARISON:  07/14/2023 FINDINGS: Heart is upper limits normal in size. Mediastinal contours within normal limits. Left lung clear. Patchy opacity in the right mid lung. No effusions. No acute bony abnormality. IMPRESSION: Patchy opacity in the right mid lung could reflect early infiltrate. Electronically Signed   By: Franky Crease  M.D.   On: 11/12/2023 00:56     Procedures   Medications Ordered in the ED  lactated ringers  infusion ( Intravenous New Bag/Given 11/12/23 0149)  lactated ringers  bolus 1,000 mL (0 mLs Intravenous Stopped 11/12/23 0324)  ceFEPIme  (MAXIPIME ) 2 g in sodium chloride  0.9 % 100 mL IVPB (0 g Intravenous Stopped 11/12/23 0325)  metroNIDAZOLE  (FLAGYL ) IVPB 500 mg (0 mg Intravenous Stopped 11/12/23 0324)  vancomycin  (VANCOCIN ) IVPB 1000 mg/200 mL premix (0 mg Intravenous Stopped 11/12/23 0325)  acetaminophen  (TYLENOL ) tablet 650 mg (650 mg Oral Given 11/12/23 0047)  ondansetron  (ZOFRAN ) injection 4 mg (4 mg Intravenous Given 11/12/23 0100)  iohexol  (OMNIPAQUE ) 300 MG/ML solution 80 mL (80 mLs Intravenous Contrast Given 11/12/23 0151)                                    Medical Decision Making Amount and/or Complexity of Data Reviewed Labs: ordered. Radiology: ordered.  Risk OTC drugs. Prescription drug management.   Patient presents to the emergency department for evaluation of weakness, chills, rigors.  He had been doing well earlier today.  Wife reports that he called out to her from his bed and she noticed that he was altered.  At arrival to the ER he is extremely weak, cannot stand.  Temperature is 105.  Patient markedly tachycardic but not hypotensive.  Treated empirically for presumed sepsis.  As his fever improved with Tylenol  and he was given a liter of fluids, heart rate has significantly improved.  Oxygen saturations have dropped a little and he is now requiring supplemental oxygen by nasal cannula.  CT head unremarkable.  X-ray with likely pneumonia in the right midlung field.  Patient underwent CT chest abdomen and pelvis to further evaluate for source of sepsis.  There is no acute focal pneumonia in the right lung.  Additionally he has evidence of acute pancreatitis with gallstones.  Lipase is elevated.  LFTs are not.  Patient will be admitted to the hospital for further management of  acute pancreatitis and multifocal pneumonia.  Cover with broad-spectrum antibiotics here in the ED.  CRITICAL CARE Performed by: Lonni JINNY Seats   Total critical care time: 35 minutes  Critical care time was exclusive of separately billable procedures and treating other patients.  Critical care was necessary to treat or prevent imminent or life-threatening deterioration.  Critical care was time spent personally by me on the following activities: development of treatment plan with patient and/or surrogate as well as nursing, discussions with consultants, evaluation of patient's response to treatment, examination of patient, obtaining history from patient or surrogate, ordering and performing treatments and interventions, ordering and review of laboratory studies, ordering and review of radiographic studies, pulse oximetry and re-evaluation of patient's condition.      Final diagnoses:  Acute pancreatitis, unspecified complication status, unspecified pancreatitis type  Multifocal pneumonia    ED Discharge Orders     None          Haze Lonni PARAS, MD 11/12/23 0345

## 2023-11-12 NOTE — Plan of Care (Signed)

## 2023-11-12 NOTE — Consult Note (Signed)
 [facility] Gastroenterology Consult  Referring Provider: No ref. provider found Primary Care Physician:  Leonel Cole, MD Primary Gastroenterologist: [facility]  Reason for Consultation: Pancreatitis, cholelithiasis, elevated bilirubin  SUBJECTIVE:   HPI: Miguel [name] is a 73 y.o. male with past medical history significant for gastroesophageal reflux disease, hypertension, hyperlipidemia.  Medical history of significant for perforated diverticulitis in April 2017 requiring emergent sigmoid colectomy and Hartman's pouch.  Underwent antrectomy with Billroth II anastomosis for bleeding ulcer in August 2017.  Underwent surgery for incarcerated hiatal hernia and perforated small bowel in August 2017.  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  Last seen in [place] [facility] office on 10/25/2023 for abdominal pain, thought to be related in part to chronic constipation as well as adhesions.  Started on Amitiza.  Labs 08/25/2023 showed alkaline phosphatase 213, AST/ALT 22/52.  Abdominal ultrasound 08/30/2023 showed layering sludge and cholelithiasis, no wall thickening, common bile duct 4 mm in size.  He was seen evaluated in office by Dr. Vernetta with [facility] on 08/10/2022 for chronic generalized abdominal pain.  This was thought to be due in part from a very lax abdominal wall and possible hernia after having multiple abdominal surgeries.  Consideration was given to surgical repair, but this was ultimately not pursued.  EGD 10/27/2015 (Dr. Rosalie) for hematemesis and melena showed large hiatal hernia, incisura ulcer.  EGD 07/13/2017 showed markedly severe reflux esophagitis, patent Billroth II gastrojejunostomy.  Cologuard 08/2023 negative.  Colonoscopy 09/21/2007 (Dr. Donnald) showed 2 mm cecum benign polyp, sigmoid and descending diverticulosis.  Accompanied at bedside by his spouse.  Spouse noted  that he became confused yesterday evening after going to sleep at 9 PM.  There was questionable fever, though not measured.  He did not have abdominal pain yesterday, he ate dinner as usual.  On exam today, having generalized abdominal discomfort.  Denied nausea or vomiting.  No chest pain or shortness of breath.  No melena or hematochezia.  Labs showed AST/ALT 121/263, total bilirubin 4.3, alkaline phosphatase 04/02/2007, lipase 1124.  CT scan of chest abdomen pelvis showed periportal edema, distended gallbladder with multiple stones, mild wall thickening of gallbladder, no biliary dilatation, edema and stranding of the pancreas.  Past Medical History:  Diagnosis Date   Anemia    Anxiety    BPH (benign prostatic hyperplasia)    Gastritis    GERD (gastroesophageal reflux disease)    seldom (07/16/2016)   GI bleed due to NSAIDs 10/27/2015   History of blood transfusion 06/2016   post OR/notes 07/15/2016   History of hiatal hernia    History of kidney stones    surgery to remove stone   Hyperlipidemia    Hypertension    no meds    Melanoma of back (HCC)    mid back   Sigmoid diverticulitis    with perforation   Past Surgical History:  Procedure Laterality Date   COLON SURGERY     sigmoid   COLOSTOMY TAKEDOWN N/A 06/28/2016   Procedure: COLOSTOMY TAKEDOWN;  Surgeon: Vicenta Vernetta, MD;  Location: [facility] OR;  Service: General;  Laterality: N/A;   CYSTOSCOPY WITH RETROGRADE PYELOGRAM, URETEROSCOPY AND STENT PLACEMENT Bilateral 02/12/2020   Procedure: CYSTOSCOPY WITH BILATERAL RETROGRADE PYELOGRAM,  AND LITHOPEXY;  Surgeon: Rosalind Zachary NOVAK, MD;  Location: [facility] ORS;  Service: Urology;  Laterality: Bilateral;  1 HR   ESOPHAGOGASTRODUODENOSCOPY N/A 10/27/2015   Procedure: ESOPHAGOGASTRODUODENOSCOPY (  EGD);  Surgeon: Oliva Boots, MD;  Location: [facility] ENDOSCOPY;  Service: Endoscopy;  Laterality: N/A;   ESOPHAGOGASTRODUODENOSCOPY N/A 10/29/2015   Procedure: ESOPHAGOGASTRODUODENOSCOPY (EGD);  Surgeon:  Lamar Bunk, MD;  Location: [facility] ENDOSCOPY;  Service: Endoscopy;  Laterality: N/A;   ESOPHAGOGASTRODUODENOSCOPY N/A 10/30/2015   Procedure: ESOPHAGOGASTRODUODENOSCOPY (EGD);  Surgeon: Oliva Boots, MD;  Location: [facility] ENDOSCOPY;  Service: Endoscopy;  Laterality: N/A;   g tube discontinued  04/2020   per patient   GASTROSTOMY TUBE PLACEMENT  11/21/2015   REDUCTION OF HIATAL HERNIA , REPAIR HIATAL HERNIA, RESECTION SMALL BOWEL WITH ANASTOMOSIS, PLACEMENT GASTROSTOMY TUBE, PLACEMENT DUODENOSTOMY TUBE (N/A)   HEMORRHOID BANDING  X 2   HERNIA REPAIR     HIATAL HERNIA REPAIR N/A 11/21/2015   Procedure: REDUCTION OF HIATAL HERNIA , REPAIR HIATAL HERNIA, RESECTION SMALL BOWEL WITH ANASTOMOSIS, PLACEMENT GASTROSTOMY TUBE, PLACEMENT DUODENOSTOMY TUBE;  Surgeon: Herlene Beverley Bureau, MD;  Location: [facility] OR;  Service: General;  Laterality: N/A;   HOLMIUM LASER APPLICATION Right 02/12/2020   Procedure: HOLMIUM LASER APPLICATION;  Surgeon: Rosalind Zachary NOVAK, MD;  Location: [facility] ORS;  Service: Urology;  Laterality: Right;   INCISIONAL HERNIA REPAIR  06/28/2016   open/notes 07/15/2016   INCISIONAL HERNIA REPAIR  03/17/2018   WITH MESH   INCISIONAL HERNIA REPAIR N/A 03/17/2018   Procedure: INCISIONAL HERNIA REPAIR WITH MESH;  Surgeon: [name], MD;  Location: [facility] OR;  Service: General;  Laterality: N/A;   INCISIONAL HERNIA REPAIR N/A 10/16/2020   Procedure: SHIRLENE HERNIA REPAIR WITH MESH;  Surgeon: [name], MD;  Location: [facility] OR;  Service: General;  Laterality: N/A;   INGUINAL HERNIA REPAIR Bilateral 09/28/2018   INGUINAL HERNIA REPAIR Bilateral 09/28/2018   Procedure: BILATERAL OPEN INGUINAL HERNIA REPAIR WITH MESH;  Surgeon: [name], MD;  Location: [facility] OR;  Service: General;  Laterality: Bilateral;  GENERAL AND TAP BLOCK   INSERTION OF MESH N/A 03/17/2018   Procedure: INSERTION OF MESH;  Surgeon: [name], MD;  Location: [facility] OR;  Service: General;  Laterality: N/A;   INSERTION OF  MESH Bilateral 09/28/2018   Procedure: Insertion Of Mesh;  Surgeon: [name], MD;  Location: [facility] OR;  Service: General;  Laterality: Bilateral;   IR CM INJ ANY COLONIC TUBE W/FLUORO  02/04/2017   IR GUIDED DRAIN W CATHETER PLACEMENT  07/06/2016   /NOTES 07/15/2016   IR PATIENT EVAL TECH 0-60 MINS  06/28/2019   IR RADIOLOGIST EVAL & MGMT  07/27/2016   IR RADIOLOGIST EVAL & MGMT  08/17/2016   IR RADIOLOGIST EVAL & MGMT  08/26/2016   IR REPLACE G-TUBE SIMPLE WO FLUORO  07/28/2017   IR REPLACE G-TUBE SIMPLE WO FLUORO  01/31/2018   IR REPLACE G-TUBE SIMPLE WO FLUORO  10/16/2018   IR REPLACE G-TUBE SIMPLE WO FLUORO  03/05/2019   IR REPLACE G-TUBE SIMPLE WO FLUORO  08/22/2019   IR REPLACE G-TUBE SIMPLE WO FLUORO  01/08/2020   IR REPLC GASTRO/COLONIC TUBE PERCUT W/FLUORO  08/04/2016   IR REPLC GASTRO/COLONIC TUBE PERCUT W/FLUORO  01/14/2017   IR US  GUIDE BX ASP/DRAIN  08/04/2016   KNEE CARTILAGE SURGERY Right 1971   opened me up   LAPAROTOMY N/A 07/05/2015   Procedure: PARTIAL SIGMOID COLECTOMY AND COLOSTOMY;  Surgeon: Berg Vernetta, MD;  Location: [facility] OR;  Service: General;  Laterality: N/A;   MELANOMA EXCISION  2001   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 Authorizing Provider  acetaminophen  (TYLENOL ) 500 MG tablet Take 1 tablet (500 mg total) by mouth every 8 (eight) hours as needed for mild pain (for pain). Patient taking differently: Take 250-500 mg by mouth every 4 (four) hours as needed for mild pain (pain score 1-3) or fever. 12/05/15  Yes [name], PA-C  AMITIZA 24 MCG  capsule Take 24 mcg by mouth 2 (two) times daily with a meal. 09/15/23  Yes [provider]  amLODipine  (NORVASC ) 5 MG tablet Take 5 mg by mouth daily. 12/23/22  Yes [provider]  diclofenac sodium (VOLTAREN) 1 % GEL Apply 2 g topically 3 (three) times daily as needed (joint pain).    Yes [provider]  finasteride  (PROSCAR ) 5 MG tablet Take 5 mg by mouth daily.   Yes [provider]  gabapentin  (NEURONTIN ) 100 MG capsule Take 1 capsule by mouth 3 (three) times daily. 07/06/23  Yes [provider]  LORazepam  (ATIVAN ) 1 MG tablet Take 0.5 mg by mouth daily as needed for anxiety. 05/29/18  Yes [provider]  meloxicam (MOBIC) 15 MG tablet Take 15 mg by mouth daily as needed.   Yes [provider]  methocarbamol  (ROBAXIN ) 500 MG tablet Take 250-500 mg by mouth every 8 (eight) hours as needed for muscle spasms.  07/11/18  Yes [provider]  Multiple Vitamin (MULTIVITAMIN WITH MINERALS) TABS tablet Take 1 tablet by mouth daily.   Yes [provider]  olmesartan (BENICAR) 20 MG tablet Take 20 mg by mouth daily. 10/27/23  Yes [provider]  oxyCODONE  (OXY IR/ROXICODONE ) 5 MG immediate release tablet Take 1 tablet (5 mg total) by mouth every 6 (six) hours as needed for moderate pain, severe pain or breakthrough pain. 10/20/20  Yes [name], MD  pantoprazole  (PROTONIX ) 20 MG tablet Take 20 mg by mouth daily. 01/18/18  Yes [provider]  polyethylene glycol (MIRALAX / GLYCOLAX) 17 g packet Take 17 g by mouth daily as needed for mild constipation.   Yes [provider]  Probiotic Product (PROBIOTIC DAILY PO) Take 1 capsule by mouth daily.   Yes [provider]  senna (SENOKOT) 8.6 MG tablet Take 2 tablets by mouth at bedtime.   Yes [provider]  tamsulosin  (FLOMAX ) 0.4 MG CAPS capsule Take 1 capsule (0.4 mg total) by mouth daily. 01/04/16  Yes Gail Favorite, MD  traMADol   (ULTRAM ) 50 MG tablet Take 50 mg by mouth every 6 (six) hours as needed for moderate pain. 05/21/16  Yes [provider]  vitamin B-12 (CYANOCOBALAMIN) 1000 MCG tablet Take 1,000 mcg by mouth daily.    Yes [provider]  zolpidem  (AMBIEN ) 10 MG tablet Take 10 mg by mouth at bedtime.   Yes [provider]  diazepam  (VALIUM ) 5 MG tablet Take one tablet by mouth with food one hour prior to procedure. May repeat 30 minutes prior if needed. Patient not taking: Reported on 11/12/2023 04/14/21   [name], [name], NP  famotidine  (PEPCID ) 20 MG tablet Take 20 mg by mouth 2 (two) times daily.  Patient not taking: Reported on 11/12/2023  [provider]  HYDROcodone -acetaminophen  (NORCO/VICODIN) 5-325 MG tablet Take 1 tablet by mouth 2 (two) times daily as needed for moderate pain. Patient not taking: Reported on 11/12/2023 07/06/21   [name], MD  tiZANidine (ZANAFLEX) 2 MG tablet Take 1-2 mg by mouth 2 (two) times daily as needed for muscle spasms. Patient not taking: Reported on 11/12/2023 01/16/20   [provider]   Current Facility-Administered Medications  Medication Dose Route Frequency Provider Last Rate Last Admin   acetaminophen  (TYLENOL ) tablet 650 mg  650 mg Oral Q6H PRN Zella, Mir M, MD   650 mg at 11/12/23 1142   Or   acetaminophen  (TYLENOL ) suppository 650 mg  650 mg Rectal Q6H PRN Zella, Mir M, MD       albuterol  (PROVENTIL ) (2.5 MG/3ML) 0.083% nebulizer solution 2.5 mg  2.5 mg Nebulization Q2H PRN Zella, Mir M, MD       amLODipine  (NORVASC ) tablet 5 mg  5 mg Oral Daily Ikramullah, Mir M, MD   5 mg at 11/12/23 1332   azithromycin  (ZITHROMAX ) 500 mg in sodium chloride  0.9 % 250 mL IVPB  500 mg Intravenous Q24H Zella, Mir M, MD 250 mL/hr at 11/12/23 1135 500 mg at 11/12/23 1135   cefTRIAXone  (ROCEPHIN ) 1 g in sodium chloride  0.9 % 100 mL IVPB  1 g Intravenous Q24H Zella, Mir M, MD 200 mL/hr at 11/12/23 1152 1 g  at 11/12/23 1152   enoxaparin  (LOVENOX ) injection 40 mg  40 mg Subcutaneous Q24H Zella, Mir M, MD   40 mg at 11/12/23 1143   finasteride  (PROSCAR ) tablet 5 mg  5 mg Oral Daily Zella, Mir M, MD   5 mg at 11/12/23 1332   gabapentin  (NEURONTIN ) capsule 100 mg  100 mg Oral TID Zella Katha HERO, MD       HYDROmorphone  (DILAUDID ) injection 1 mg  1 mg Intravenous Q3H PRN Zella Katha HERO, MD       lactated ringers  infusion   Intravenous Continuous Pollina, Christopher J, MD 150 mL/hr at 11/12/23 0809 New Bag at 11/12/23 0809   LORazepam  (ATIVAN ) tablet 0.5 mg  0.5 mg Oral Daily PRN Zella, Mir M, MD       ondansetron  (ZOFRAN ) tablet 4 mg  4 mg Oral Q6H PRN Zella, Mir M, MD       Or   ondansetron  (ZOFRAN ) injection 4 mg  4 mg Intravenous Q6H PRN Zella, Mir M, MD       oxyCODONE  (Oxy IR/ROXICODONE ) immediate release tablet 5 mg  5 mg Oral Q4H PRN Zella, Mir M, MD   5 mg at 11/12/23 9047   pantoprazole  (PROTONIX ) EC tablet 20 mg  20 mg Oral Daily Zella, Mir M, MD       traZODone  (DESYREL ) tablet 25 mg  25 mg Oral QHS PRN Zella, Mir M, MD       Facility-Administered Medications Ordered in Other Encounters  Medication Dose Route Frequency Provider Last Rate Last Admin   morphine  4 MG/ML injection 1-4 mg  1-4 mg Intravenous Q1H PRN [name], MD       Allergies as of 11/11/2023   (No Known Allergies)   Family History  Problem Relation Age of Onset   Stroke Mother    Stroke Brother    Heart disease Brother    Social History   Socioeconomic History   Marital status: Married    Spouse name: Not on file   Number of children: Not on file   Years of  education: Not on file   Highest education level: Not on file  Occupational History   Occupation: unable to work since April  Tobacco Use   Smoking status: Never   Smokeless tobacco: Former    Types: Snuff   Tobacco comments:    occasional snuff As of 10/23/21. Tay  Vaping Use   Vaping status: Never  Used  Substance and Sexual Activity   Alcohol  use: No   Drug use: No   Sexual activity: Not Currently  Other Topics Concern   Not on file  Social History Narrative   Not on file   Social Drivers of Health   Financial Resource Strain: Not on file  Food Insecurity: No Food Insecurity (11/12/2023)   Hunger Vital Sign    Worried About Running Out of Food in the Last Year: Never true    Ran Out of Food in the Last Year: Never true  Transportation Needs: No Transportation Needs (11/12/2023)   PRAPARE - Administrator, Civil Service (Medical): No    Lack of Transportation (Non-Medical): No  Physical Activity: Not on file  Stress: Not on file  Social Connections: Socially Integrated (11/12/2023)   Social Connection and Isolation Panel    Frequency of Communication with Friends and Family: More than three times a week    Frequency of Social Gatherings with Friends and Family: Three times a week    Attends Religious Services: More than 4 times per year    Active Member of Clubs or Organizations: Yes    Attends Banker Meetings: More than 4 times per year    Marital Status: Married  Catering manager Violence: Not At Risk (11/12/2023)   Humiliation, Afraid, Rape, and Kick questionnaire    Fear of Current or Ex-Partner: No    Emotionally Abused: No    Physically Abused: No    Sexually Abused: No   Review of Systems:  Review of Systems  Constitutional:  Positive for fever.  Respiratory:  Negative for shortness of breath.   Cardiovascular:  Negative for chest pain.  Gastrointestinal:  Positive for abdominal pain. Negative for blood in stool, nausea and vomiting.    OBJECTIVE:   Temp:  [98.2 F (36.8 C)-105 F (40.6 C)] 98.7 F (37.1 C) (08/16 1150) Pulse Rate:  [83-149] 83 (08/16 0621) Resp:  [15-29] 16 (08/16 1150) BP: (108-154)/(58-86) 136/66 (08/16 1150) SpO2:  [89 %-100 %] 100 % (08/16 1150) Weight:  [61.6 kg-62.6 kg] 61.6 kg (08/16 0621) Last BM Date  : 11/11/23 Physical Exam Constitutional:      General: He is not in acute distress.    Appearance: He is not ill-appearing, toxic-appearing or diaphoretic.  Cardiovascular:     Rate and Rhythm: Normal rate and regular rhythm.  Pulmonary:     Effort: No respiratory distress.     Breath sounds: Normal breath sounds.  Abdominal:     General: Bowel sounds are normal. There is no distension.     Palpations: Abdomen is soft.     Tenderness: There is abdominal tenderness. There is no guarding.  Neurological:     Mental Status: He is alert.     Labs: Recent Labs    11/12/23 0004  WBC 11.3*  HGB 12.4*  HCT 37.4*  PLT 268   BMET Recent Labs    11/12/23 0004  NA 136  K 4.3  CL 100  CO2 21*  GLUCOSE 101*  BUN 17  CREATININE 0.99  CALCIUM  9.3  LFT Recent Labs    11/12/23 0004  PROT 6.9  ALBUMIN  4.0  AST 121*  ALT 263*  ALKPHOS 1,409*  BILITOT 4.3*   PT/INR Recent Labs    11/12/23 0004  LABPROT 13.7  INR 1.0    Diagnostic imaging: CT CHEST ABDOMEN PELVIS W CONTRAST Result Date: 11/12/2023 CLINICAL DATA:  Sepsis EXAM: CT CHEST, ABDOMEN, AND PELVIS WITH CONTRAST TECHNIQUE: Multidetector CT imaging of the chest, abdomen and pelvis was performed following the standard protocol during bolus administration of intravenous contrast. RADIATION DOSE REDUCTION: This exam was performed according to the departmental dose-optimization program which includes automated exposure control, adjustment of the mA and/or kV according to patient size and/or use of iterative reconstruction technique. CONTRAST:  80mL OMNIPAQUE  IOHEXOL  300 MG/ML  SOLN COMPARISON:  Same day radiograph; CT chest 10/07/2023; CT abdomen pelvis 09/30/2023 FINDINGS: CT CHEST FINDINGS Cardiovascular: No pericardial effusion. Normal caliber thoracic aorta. Coronary artery and aortic atherosclerotic calcification. Mediastinum/Nodes: Trachea is unremarkable. Wall thickening about the distal esophagus. No pathologic  adenopathy. Lungs/Pleura: Consolidative and ground-glass opacities right upper and middle lobes along the minor fissure and right lower lobe. This is compatible with multifocal pneumonia. Follow-up in 8-10 weeks is recommended to ensure resolution. No pleural effusion or pneumothorax. Musculoskeletal: No acute fracture. CT ABDOMEN PELVIS FINDINGS Hepatobiliary: Periportal edema. The liver is otherwise unremarkable. Distended gallbladder with multiple stones. Mild gallbladder wall thickening. No biliary dilation. Pancreas: Edema and stranding about the pancreas compatible with pancreatitis. No ductal dilation no organized fluid collection. No evidence of pancreatic necrosis. Spleen: Unremarkable. Adrenals/Urinary Tract: Stable adrenal glands. No urinary calculi or hydronephrosis. Scarring in the right kidney. Chronic cystic dilation of the distal ureters just proximal to the UVJ is bilaterally is unchanged. Unremarkable bladder. Stomach/Bowel: No bowel obstruction. Postoperative change about the stomach. Postoperative change about the colon in the pelvis. Wall thickening about the stomach likely reactive secondary to pancreatitis. Vascular/Lymphatic: Mild aortic atherosclerotic calcification. 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 No lymphadenopathy. Reproductive: Enlarged prostate. Other: No free intraperitoneal fluid or air. Musculoskeletal: No acute fracture. IMPRESSION: 1. Acute pancreatitis. No evidence of pancreatic necrosis or organized fluid collection. 2. Multifocal pneumonia in the right lung. Follow-up in 8-10 weeks is recommended to ensure resolution. 3. Distended gallbladder with multiple stones and mild gallbladder wall thickening. Findings likely secondary to pancreatitis. 4. Wall thickening about the distal esophagus and stomach likely reactive secondary to pancreatitis. 5. Aortic Atherosclerosis (ICD10-I70.0). Electronically Signed   By: Norman Gatlin M.D.   On: 11/12/2023 02:11    CT HEAD WO CONTRAST ( ) Result Date: 11/12/2023 EXAM: CT HEAD WITHOUT CONTRAST 11/12/2023 12:45:51 AM TECHNIQUE: CT of the head was performed without the administration of intravenous contrast. Automated exposure control, iterative reconstruction, and/or weight based adjustment of the mA/kV was utilized to reduce the radiation dose to as low as reasonably achievable. COMPARISON: 01/14/2023 CLINICAL HISTORY: Mental status change, unknown cause. Pt BIB wife from home due to shaking. Per wife patient is more altered than usual. Pt reports it was a sudden onset 1hr PTA. FINDINGS: BRAIN AND VENTRICLES: No acute hemorrhage. Gray-white differentiation is preserved. No hydrocephalus. No extra-axial collection. No mass effect or midline shift. ORBITS: No acute abnormality. SINUSES: No acute abnormality. SOFT TISSUES AND SKULL: No acute soft tissue abnormality. No skull fracture. IMPRESSION: 1. No acute intracranial abnormality. Electronically signed by: Franky Stanford MD 11/12/2023 01:07 AM EDT RP Workstation: [id number]   DG Chest Port 1 View Result Date: 11/12/2023 CLINICAL DATA:  Questionable sepsis - evaluate  for abnormality. Altered mental status. EXAM: PORTABLE CHEST 1 VIEW COMPARISON:  07/14/2023 FINDINGS: Heart is upper limits normal in size. Mediastinal contours within normal limits. Left lung clear. Patchy opacity in the right mid lung. No effusions. No acute bony abnormality. IMPRESSION: Patchy opacity in the right mid lung could reflect early infiltrate. Electronically Signed   By: Franky Crease M.D.   On: 11/12/2023 00:56   IMPRESSION: Gallstone pancreatitis Transaminase elevation in mixed pattern Abdominal pain secondary to above History of perforated sigmoid diverticulitis requiring sigmoid colectomy in 2017 History of gastric ulcer requiring Billroth II in 2017 History incarcerated hiatal hernia and perforated small bowel requiring surgical repair in 2017  PLAN: -Await MRCP, trend liver  enzymes -Recommend general surgery consultation -Continue supportive care for pancreatitis including IV fluids, pain medications per primary team, patient feels ready to attempt clear liquids -[facility] will follow   LOS: 0 days   Estefana Keas, [facility]

## 2023-11-12 NOTE — H&P (Addendum)
 History and Physical  [name] FMW:[id number] DOB: May 12, 1950 DOA: 11/11/2023  PCP: Leonel Cole, MD   Chief Complaint: Fevers and chills, abdominal pain  HPI: Miguel [name] is a 73 y.o. male with medical history significant for hypertension, hyperlipidemia admitted to the hospital for community-acquired pneumonia and acute pancreatitis.  History provided by the patient states that he was in his usual state of health, went on to eat with his wife last night was feeling fine.  He went to bed, but soon after states that he started to feel cold, started to shake, started to feel febrile.  Currently he also has some mild confusion, but this seems to have resolved.  He presented to the emergency department, where workup as detailed below showed mild leukocytosis, fever, tachycardia, elevated lipase and LFTs.  He had a CT scan which demonstrated pancreatitis as well as pneumonia, he was given empiric IV antibiotics and admitted to the hospitalist service at [facility].  Review of Systems: Please see HPI for pertinent positives and negatives. A complete 10 system review of systems are otherwise negative.  Past Medical History:  Diagnosis Date   Anemia    Anxiety    BPH (benign prostatic hyperplasia)    Gastritis    GERD (gastroesophageal reflux disease)    seldom (07/16/2016)   GI bleed due to NSAIDs 10/27/2015   History of blood transfusion 06/2016   post OR/notes 07/15/2016   History of hiatal hernia    History of kidney stones    surgery to remove stone   Hyperlipidemia    Hypertension    no meds    Melanoma of back (HCC)    mid back   Sigmoid diverticulitis    with perforation   Past Surgical History:  Procedure Laterality Date   COLON SURGERY     sigmoid   COLOSTOMY TAKEDOWN N/A 06/28/2016   Procedure: COLOSTOMY TAKEDOWN;  Surgeon: Vicenta Poli, MD;  Location: [facility] OR;  Service: General;  Laterality: N/A;   CYSTOSCOPY WITH RETROGRADE PYELOGRAM, URETEROSCOPY AND STENT PLACEMENT  Bilateral 02/12/2020   Procedure: CYSTOSCOPY WITH BILATERAL RETROGRADE PYELOGRAM,  AND LITHOPEXY;  Surgeon: Rosalind Zachary NOVAK, MD;  Location: [facility] ORS;  Service: Urology;  Laterality: Bilateral;  1 HR   ESOPHAGOGASTRODUODENOSCOPY N/A 10/27/2015   Procedure: ESOPHAGOGASTRODUODENOSCOPY (EGD);  Surgeon: Oliva Boots, MD;  Location: [facility] ENDOSCOPY;  Service: Endoscopy;  Laterality: N/A;   ESOPHAGOGASTRODUODENOSCOPY N/A 10/29/2015   Procedure: ESOPHAGOGASTRODUODENOSCOPY (EGD);  Surgeon: Lamar Bunk, MD;  Location: [facility] ENDOSCOPY;  Service: Endoscopy;  Laterality: N/A;   ESOPHAGOGASTRODUODENOSCOPY N/A 10/30/2015   Procedure: ESOPHAGOGASTRODUODENOSCOPY (EGD);  Surgeon: Oliva Boots, MD;  Location: [facility] ENDOSCOPY;  Service: Endoscopy;  Laterality: N/A;   g tube discontinued  04/2020   per patient   GASTROSTOMY TUBE PLACEMENT  11/21/2015   REDUCTION OF HIATAL HERNIA , REPAIR HIATAL HERNIA, RESECTION SMALL BOWEL WITH ANASTOMOSIS, PLACEMENT GASTROSTOMY TUBE, PLACEMENT DUODENOSTOMY TUBE (N/A)   HEMORRHOID BANDING  X 2   HERNIA REPAIR     HIATAL HERNIA REPAIR N/A 11/21/2015   Procedure: REDUCTION OF HIATAL HERNIA , REPAIR HIATAL HERNIA, RESECTION SMALL BOWEL WITH ANASTOMOSIS, PLACEMENT GASTROSTOMY TUBE, PLACEMENT DUODENOSTOMY TUBE;  Surgeon: Herlene Beverley Bureau, MD;  Location: [facility] OR;  Service: General;  Laterality: N/A;   HOLMIUM LASER APPLICATION Right 02/12/2020   Procedure: HOLMIUM LASER APPLICATION;  Surgeon: Rosalind Zachary NOVAK, MD;  Location: [facility] ORS;  Service: Urology;  Laterality: Right;   INCISIONAL HERNIA REPAIR  06/28/2016   open/notes 07/15/2016   INCISIONAL  HERNIA REPAIR  03/17/2018   WITH MESH   INCISIONAL HERNIA REPAIR N/A 03/17/2018   Procedure: INCISIONAL HERNIA REPAIR WITH MESH;  Surgeon: [name], MD;  Location: [facility] OR;  Service: General;  Laterality: N/A;   INCISIONAL HERNIA REPAIR N/A 10/16/2020   Procedure: SHIRLENE HERNIA REPAIR WITH MESH;  Surgeon: [name], MD;  Location: [facility] OR;   Service: General;  Laterality: N/A;   INGUINAL HERNIA REPAIR Bilateral 09/28/2018   INGUINAL HERNIA REPAIR Bilateral 09/28/2018   Procedure: BILATERAL OPEN INGUINAL HERNIA REPAIR WITH MESH;  Surgeon: [name], MD;  Location: [facility] OR;  Service: General;  Laterality: Bilateral;  GENERAL AND TAP BLOCK   INSERTION OF MESH N/A 03/17/2018   Procedure: INSERTION OF MESH;  Surgeon: [name], MD;  Location: [facility] OR;  Service: General;  Laterality: N/A;   INSERTION OF MESH Bilateral 09/28/2018   Procedure: Insertion Of Mesh;  Surgeon: [name], MD;  Location: [facility] OR;  Service: General;  Laterality: Bilateral;   IR CM INJ ANY COLONIC TUBE W/FLUORO  02/04/2017   IR GUIDED DRAIN W CATHETER PLACEMENT  07/06/2016   /NOTES 07/15/2016   IR PATIENT EVAL TECH 0-60 MINS  06/28/2019   IR RADIOLOGIST EVAL & MGMT  07/27/2016   IR RADIOLOGIST EVAL & MGMT  08/17/2016   IR RADIOLOGIST EVAL & MGMT  08/26/2016   IR REPLACE G-TUBE SIMPLE WO FLUORO  07/28/2017   IR REPLACE G-TUBE SIMPLE WO FLUORO  01/31/2018   IR REPLACE G-TUBE SIMPLE WO FLUORO  10/16/2018   IR REPLACE G-TUBE SIMPLE WO FLUORO  03/05/2019   IR REPLACE G-TUBE SIMPLE WO FLUORO  08/22/2019   IR REPLACE G-TUBE SIMPLE WO FLUORO  01/08/2020   IR REPLC GASTRO/COLONIC TUBE PERCUT W/FLUORO  08/04/2016   IR REPLC GASTRO/COLONIC TUBE PERCUT W/FLUORO  01/14/2017   IR US  GUIDE BX ASP/DRAIN  08/04/2016   KNEE CARTILAGE SURGERY Right 1971   opened me up   LAPAROTOMY N/A 07/05/2015   Procedure: PARTIAL SIGMOID COLECTOMY AND COLOSTOMY;  Surgeon: Berg Vernetta, MD;  Location: [facility] OR;  Service: General;  Laterality: N/A;   MELANOMA EXCISION  2001   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 He has quit using smokeless tobacco.  His smokeless tobacco use included snuff. He reports that he does not drink alcohol  and does not use drugs.  No Known Allergies  Family History  Problem Relation Age of Onset   Stroke Mother    Stroke Brother    Heart disease Brother      Prior to Admission medications   Medication Sig Start Date End Date Taking? Authorizing Provider  acetaminophen  (TYLENOL ) 500 MG tablet Take 1 tablet (500 mg total) by mouth every 8 (eight) hours as needed for mild pain (for pain). Patient taking differently: Take 250-500 mg by mouth every 4 (four) hours as needed for mild pain (pain score 1-3) or fever. 12/05/15   [name], PA-C  diazepam  (VALIUM ) 5 MG tablet Take one tablet by mouth with food one hour prior to procedure. May repeat 30  minutes prior if needed. 04/14/21   [name], [name], NP  diclofenac sodium (VOLTAREN) 1 % GEL Apply 2 g topically 3 (three) times daily as needed (joint pain).     [provider]  famotidine  (PEPCID ) 20 MG tablet Take 20 mg by mouth 2 (two) times daily.     [provider]  HYDROcodone -acetaminophen  (NORCO/VICODIN) 5-325 MG tablet Take 1 tablet by mouth 2 (two) times daily as needed for moderate pain. 07/06/21   [name], MD  LORazepam  (ATIVAN ) 1 MG tablet Take 0.5 mg by mouth daily as needed for anxiety. 05/29/18   [provider]  methocarbamol  (ROBAXIN ) 500 MG tablet Take 250-500 mg by mouth every 8 (eight) hours as needed for muscle spasms.  07/11/18   [provider]  methylPREDNISolone  (MEDROL ) 4 MG tablet Medrol  dose pack. Take as instructed 11/24/20   [name], MD  Multiple Vitamin (MULTIVITAMIN WITH MINERALS) TABS tablet Take 1 tablet by mouth daily.    [provider]  ondansetron  (ZOFRAN -ODT) 4 MG disintegrating tablet Take 4 mg by  mouth every 6 (six) hours as needed for nausea/vomiting. 07/11/18   [provider]  oxyCODONE  (OXY IR/ROXICODONE ) 5 MG immediate release tablet Take 1 tablet (5 mg total) by mouth every 6 (six) hours as needed for moderate pain, severe pain or breakthrough pain. 10/20/20   [name], MD  oxyCODONE  (OXY IR/ROXICODONE ) 5 MG immediate release tablet Take 1 tablet (5 mg total) by mouth every 8 (eight) hours as needed. 01/17/23     pantoprazole  (PROTONIX ) 40 MG tablet Take 40 mg by mouth 2 (two) times daily. 01/18/18   [provider]  polyethylene glycol (MIRALAX / GLYCOLAX) 17 g packet Take 17 g by mouth daily as needed for mild constipation.    [provider]  Probiotic Product (PROBIOTIC DAILY PO) Take 1 capsule by mouth daily.    [provider]  tamsulosin  (FLOMAX ) 0.4 MG CAPS capsule Take 1 capsule (0.4 mg total) by mouth daily. 01/04/16   [name], [name], MD  tiZANidine (ZANAFLEX) 2 MG tablet Take 1-2 mg by mouth 2 (two) times daily as needed for muscle spasms. 01/16/20   [provider]  traMADol  (ULTRAM ) 50 MG tablet Take 50 mg by mouth every 6 (six) hours as needed for moderate pain. 05/21/16   [provider]  vitamin B-12 (CYANOCOBALAMIN) 1000 MCG tablet Take 1,000 mcg by mouth daily.     [provider]  zolpidem  (AMBIEN ) 10 MG tablet Take 10 mg by mouth at bedtime.    [provider]    Physical Exam: BP 124/86 (BP Location: Left Arm)   Pulse 83   Temp 98.2 F (36.8 C) (Oral)   Resp 20   Ht 5' 6 (1.676 m)   Wt 61.6 kg   SpO2 100%   BMI 21.92 kg/m  General:  Alert, oriented, calm, in no acute distress, resting comfortably on 3 L nasal cannula oxygen.  No cough. Cardiovascular: RRR, no murmurs or rubs, no peripheral edema  Respiratory: clear to auscultation bilaterally, no wheezes, no crackles  Abdomen: soft, diffusely tender, with voluntary guarding, nondistended, normal bowel tones heard  Skin: dry,  no rashes  Musculoskeletal: no joint effusions, normal range of motion  Psychiatric: appropriate affect, normal speech  Neurologic: extraocular muscles intact, clear speech, moving all extremities with intact sensorium         Labs on Admission:  Basic Metabolic Panel: Recent Labs  Lab 11/12/23 0004  NA 136  K 4.3  CL 100  CO2 21*  GLUCOSE 101*  BUN 17  CREATININE 0.99  CALCIUM  9.3   Liver Function Tests: Recent Labs  Lab 11/12/23 0004  AST 121*  ALT 263*  ALKPHOS 1,409*  BILITOT 4.3*  PROT 6.9  ALBUMIN  4.0   Recent Labs  Lab 11/12/23 0004  LIPASE 1,124*   No results for input(s): AMMONIA in the last 168 hours. CBC: Recent Labs  Lab 11/12/23 0004  WBC 11.3*  NEUTROABS 9.6*  HGB 12.4*  HCT 37.4*  MCV 93.7  PLT 268   Cardiac Enzymes: No results for input(s): CKTOTAL, CKMB, CKMBINDEX, TROPONINI in the last 168 hours. BNP (last 3 results) No results for input(s): BNP in the last 8760 hours.  ProBNP (last 3 results) No results for input(s): PROBNP in the last 8760 hours.  CBG: Recent Labs  Lab 11/12/23 0001  GLUCAP 104*    Radiological Exams on Admission: CT CHEST ABDOMEN PELVIS W CONTRAST Result Date: 11/12/2023 CLINICAL DATA:  Sepsis EXAM: CT CHEST, ABDOMEN, AND PELVIS WITH CONTRAST TECHNIQUE: Multidetector CT imaging of the chest, abdomen and pelvis was performed following the standard protocol during bolus administration of intravenous contrast. RADIATION DOSE REDUCTION: This exam was performed according to the departmental dose-optimization program which includes automated exposure control, adjustment of the mA and/or kV according to patient size and/or use of iterative reconstruction technique. CONTRAST:  80mL OMNIPAQUE  IOHEXOL  300 MG/ML  SOLN COMPARISON:  Same day radiograph; CT chest 10/07/2023; CT abdomen pelvis 09/30/2023 FINDINGS: CT CHEST FINDINGS Cardiovascular: No pericardial effusion. Normal caliber thoracic aorta. Coronary  artery and aortic atherosclerotic calcification. Mediastinum/Nodes: Trachea is unremarkable. Wall thickening about the distal esophagus. No pathologic adenopathy. Lungs/Pleura: Consolidative and ground-glass opacities right upper and middle lobes along the minor fissure and right lower lobe. This is compatible with multifocal pneumonia. Follow-up in 8-10 weeks is recommended to ensure resolution. No pleural effusion or pneumothorax. Musculoskeletal: No acute fracture. CT ABDOMEN PELVIS FINDINGS Hepatobiliary: Periportal edema. The liver is otherwise unremarkable. Distended gallbladder with multiple stones. Mild gallbladder wall thickening. No biliary dilation. Pancreas: Edema and stranding about the pancreas compatible with pancreatitis. No ductal dilation no organized fluid collection. No evidence of pancreatic necrosis. Spleen: Unremarkable. Adrenals/Urinary Tract: Stable adrenal glands. No urinary calculi or hydronephrosis. Scarring in the right kidney. Chronic cystic dilation of the distal ureters just proximal to the UVJ is bilaterally is unchanged. Unremarkable bladder. Stomach/Bowel: No bowel obstruction. Postoperative change about the stomach. Postoperative change about the colon in the pelvis. Wall thickening about the stomach likely reactive secondary to pancreatitis. Vascular/Lymphatic: Mild aortic atherosclerotic calcification. 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 No lymphadenopathy. Reproductive: Enlarged prostate. Other: No free intraperitoneal fluid or air. Musculoskeletal: No acute fracture. IMPRESSION: 1. Acute pancreatitis. No evidence of pancreatic necrosis or organized fluid collection. 2. Multifocal pneumonia in the right lung. Follow-up in 8-10 weeks is recommended to ensure resolution. 3. Distended gallbladder with multiple stones and mild gallbladder wall thickening. Findings likely secondary to pancreatitis. 4. Wall thickening about the distal esophagus and stomach likely  reactive secondary to pancreatitis. 5. Aortic Atherosclerosis (ICD10-I70.0). Electronically Signed   By: Norman Gatlin M.D.   On: 11/12/2023 02:11   CT HEAD WO CONTRAST ( ) Result Date: 11/12/2023 EXAM: CT HEAD WITHOUT CONTRAST 11/12/2023 12:45:51 AM TECHNIQUE: CT of the head was performed without the administration of intravenous contrast. Automated exposure control, iterative reconstruction, and/or weight based adjustment of the mA/kV was utilized to reduce the radiation dose to  as low as reasonably achievable. COMPARISON: 01/14/2023 CLINICAL HISTORY: Mental status change, unknown cause. Pt BIB wife from home due to shaking. Per wife patient is more altered than usual. Pt reports it was a sudden onset 1hr PTA. FINDINGS: BRAIN AND VENTRICLES: No acute hemorrhage. Gray-white differentiation is preserved. No hydrocephalus. No extra-axial collection. No mass effect or midline shift. ORBITS: No acute abnormality. SINUSES: No acute abnormality. SOFT TISSUES AND SKULL: No acute soft tissue abnormality. No skull fracture. IMPRESSION: 1. No acute intracranial abnormality. Electronically signed by: Franky Stanford MD 11/12/2023 01:07 AM EDT RP Workstation: [id number]   DG Chest Port 1 View Result Date: 11/12/2023 CLINICAL DATA:  Questionable sepsis - evaluate for abnormality. Altered mental status. EXAM: PORTABLE CHEST 1 VIEW COMPARISON:  07/14/2023 FINDINGS: Heart is upper limits normal in size. Mediastinal contours within normal limits. Left lung clear. Patchy opacity in the right mid lung. No effusions. No acute bony abnormality. IMPRESSION: Patchy opacity in the right mid lung could reflect early infiltrate. Electronically Signed   By: Franky Crease M.D.   On: 11/12/2023 00:56   Assessment/Plan Miguel [name] is a 73 y.o. male with medical history significant for hypertension, hyperlipidemia admitted to the hospital for community-acquired pneumonia and acute pancreatitis.  Sepsis-with tachycardia, fever,  leukocytosis, source is right-sided community-acquired pneumonia.  Currently patient is hemodynamically stable, lactate is normal. -Inpatient admission -Monitor on progressive unit -Continue IV fluids -Empiric IV azithromycin  and IV Rocephin  -Follow-up peripheral blood cultures  Community-acquired pneumonia-treating as above  Acute hypoxic respiratory failure-patient not on oxygen at baseline, likely due to pneumonia.  Not on oxygen at baseline.  No evidence of PEs or other abnormality on CT. -Continue supplemental oxygen, wean as tolerated  Acute pancreatitis-in the setting of distended gallbladder with multiple stones, however no evidence of acute cholecystitis.  Potentially this is a gallstone pancreatitis. -N.p.o. -IV Lasix  -Pain and nausea medication - MRCP, and GI consult  Chronic back pain-will continue chronic home medications once reconciled  GERD-Protonix   DVT prophylaxis: Lovenox      Code Status: Full Code  Consults called: [facility] Dr. Kriss  Admission status: The appropriate patient status for this patient is INPATIENT. Inpatient status is judged to be reasonable and necessary in order to provide the required intensity of service to ensure the patient's safety. The patient's presenting symptoms, physical exam findings, and initial radiographic and laboratory data in the context of their chronic comorbidities is felt to place them at high risk for further clinical deterioration. Furthermore, it is not anticipated that the patient will be medically stable for discharge from the hospital within 2 midnights of admission.    I certify that at the point of admission it is my clinical judgment that the patient will require inpatient hospital care spanning beyond 2 midnights from the point of admission due to high intensity of service, high risk for further deterioration and high frequency of surveillance required  Time spent: 56 minutes  Sabryna Lahm CHRISTELLA Gail MD [facility] Pager [phone]  If 7PM-7AM, please contact night-coverage [email or web address] Password [facility]  11/12/2023, 9:25 AM

## 2023-11-12 NOTE — ED Notes (Addendum)
-  Called [facility] for transportation to [id number].

## 2023-11-12 NOTE — Progress Notes (Signed)
 Plan of Care Note for accepted transfer   Patient: Miguel [name] MRN: [id number]   DOA: 11/11/2023  Facility requesting transfer: [facility]   Requesting Provider: Dr. Haze   Reason for transfer: Pneumonia, acute pancreatitis   Facility course: 73 yr old man with chronic back pain and anxiety presents with rigors and confusion. He is found to be febrile and tachycardic with mild leukocytosis, normal lactate, lipase 1124, and elevated LFTs including t bili 4.3. CT demonstrates acute pancreatitis and pneumonia.   Blood cultures were collected and he was given vancomycin , cefepime , Flagyl , and 1 liter LR.   Plan of care: The patient is accepted for admission to Progressive unit, at [facility].   Author: Evalene GORMAN Sprinkles, MD 11/12/2023  Check [email or web address] for on-call coverage.  Nursing staff, Please call [facility] Admits & Consults System-Wide number on Amion as soon as patient's arrival, so appropriate admitting provider can evaluate the pt.

## 2023-11-12 NOTE — ED Notes (Signed)
 Pt placed on 3lt [place] due to SATS of 88% ,

## 2023-11-12 NOTE — Progress Notes (Signed)
 OT Cancellation Note  Patient Details Name: Miguel [name] MRN: [id number] DOB: 1950/04/23   Cancelled Treatment:    Reason Eval/Treat Not Completed: Patient at procedure or test/ unavailable. Pt out of room, down in MRI.  Donny BECKER OT Acute Rehabilitation Services Office [phone]  '  Rodgers Dorothyann Distel 11/12/2023, 10:27 AM

## 2023-11-12 NOTE — ED Notes (Signed)
 Patient transported to CT

## 2023-11-12 NOTE — Evaluation (Signed)
 Occupational Therapy Evaluation Patient Details Name: Miguel [name] MRN: [id number] DOB: 09-Feb-1951 Today's Date: 11/12/2023   History of Present Illness   Miguel [name] is a 73 y.o. male admitted to the hospital for community-acquired pneumonia, acute pancreatitis,cholelithiasis, and elevated bilirubin. PHMx:  hypertension, hyperlipidemia, anemia, anxiety, melanoma of back     Clinical Impressions This 73 yo male admitted with above presents to acute OT with PLOF of being totally independent with all basic ADLs, IADLs, driving, and active (walking with dog up to 1 mile a day). He currently is overall at a S level due to some mild weakness. He will benefit from continued OT one more session (maybe more if he ends up having surgery). We will continue to follow.     If plan is discharge home, recommend the following:   A little help with bathing/dressing/bathroom;Assist for transportation;Help with stairs or ramp for entrance     Functional Status Assessment   Patient has had a recent decline in their functional status and demonstrates the ability to make significant improvements in function in a reasonable and predictable amount of time.     Equipment Recommendations   None recommended by OT      Precautions/Restrictions   Precautions Precautions: None Restrictions Weight Bearing Restrictions Per Provider Order: No     Mobility Bed Mobility Overal bed mobility: Independent                  Transfers Overall transfer level: Needs assistance Equipment used: None Transfers: Sit to/from Stand Sit to Stand: Supervision           General transfer comment: S ambulation in hallway without AD      Balance Overall balance assessment: Mild deficits observed, not formally tested                                         ADL either performed or assessed with clinical judgement   ADL                                          General ADL Comments: overall at a S level due to generalized weakness     Vision Patient Visual Report: No change from baseline              Pertinent Vitals/Pain Pain Assessment Pain Assessment: No/denies pain     Extremity/Trunk Assessment Upper Extremity Assessment Upper Extremity Assessment: Overall WFL for tasks assessed           Communication Communication Communication: No apparent difficulties   Cognition Arousal: Alert Behavior During Therapy: WFL for tasks assessed/performed Cognition: No apparent impairments                               Following commands: Intact       Cueing   Cueing Techniques: Verbal cues              Home Living Family/patient expects to be discharged to:: Private residence Living Arrangements: Spouse/significant other Available Help at Discharge: Family;Available 24 hours/day Type of Home: House Home Access: Stairs to enter [employer] of Steps: 2 Entrance Stairs-Rails: Right;Left;Can reach both Home Layout: One level     Bathroom Shower/Tub: Walk-in shower  Bathroom Toilet: Standard     Home Equipment: Hand held shower head;Shower seat - built in          Prior Functioning/Environment Prior Level of Function : Independent/Modified Independent;Driving                    OT Problem List: Impaired balance (sitting and/or standing);Decreased activity tolerance   OT Treatment/Interventions: Self-care/ADL training;Patient/family education;Balance training      OT Goals(Current goals can be found in the care plan section)   Acute Rehab OT Goals Patient Stated Goal: to figure out what needs to be done OT Goal Formulation: With patient Time For Goal Achievement: 11/26/23 Potential to Achieve Goals: Good   OT Frequency:  Min 2X/week       AM-PAC OT 6 Clicks Daily Activity     Outcome Measure Help from another person eating meals?: None Help from another person  taking care of personal grooming?: A Little Help from another person toileting, which includes using toliet, bedpan, or urinal?: A Little Help from another person bathing (including washing, rinsing, drying)?: A Little Help from another person to put on and taking off regular upper body clothing?: A Little Help from another person to put on and taking off regular lower body clothing?: A Little 6 Click Score: 19   End of Session    Activity Tolerance: Patient tolerated treatment well Patient left: in chair;with call bell/phone within reach;with family/visitor present  OT Visit Diagnosis: Unsteadiness on feet (R26.81)                Time: 8585-8563 OT Time Calculation (min): 22 min Charges:  OT General Charges $OT Visit: 1 Visit OT Evaluation $OT Eval Moderate Complexity: 1 Mod  Cathy L. OT Acute Rehabilitation Services Office [phone]    Rodgers Dorothyann Distel 11/12/2023, 3:14 PM

## 2023-11-12 NOTE — Sepsis Progress Note (Signed)
 Elink monitoring for the code sepsis protocol.

## 2023-11-13 DIAGNOSIS — K851 Biliary acute pancreatitis without necrosis or infection: Secondary | ICD-10-CM | POA: Diagnosis not present

## 2023-11-13 DIAGNOSIS — R0902 Hypoxemia: Secondary | ICD-10-CM | POA: Insufficient documentation

## 2023-11-13 DIAGNOSIS — J189 Pneumonia, unspecified organism: Secondary | ICD-10-CM | POA: Diagnosis not present

## 2023-11-13 DIAGNOSIS — I700 Atherosclerosis of aorta: Secondary | ICD-10-CM | POA: Diagnosis not present

## 2023-11-13 LAB — COMPREHENSIVE METABOLIC PANEL WITH GFR
ALT: 109 U/L — ABNORMAL HIGH (ref 0–44)
AST: 30 U/L (ref 15–41)
Albumin: 2.5 g/dL — ABNORMAL LOW (ref 3.5–5.0)
Alkaline Phosphatase: 679 U/L — ABNORMAL HIGH (ref 38–126)
Anion gap: 7 (ref 5–15)
BUN: 14 mg/dL (ref 8–23)
CO2: 26 mmol/L (ref 22–32)
Calcium: 7.8 mg/dL — ABNORMAL LOW (ref 8.9–10.3)
Chloride: 100 mmol/L (ref 98–111)
Creatinine, Ser: 0.74 mg/dL (ref 0.61–1.24)
GFR, Estimated: >60 mL/min (ref >60)
Glucose, Bld: 86 mg/dL (ref 70–99)
Potassium: 3.5 mmol/L (ref 3.5–5.1)
Sodium: 133 mmol/L — ABNORMAL LOW (ref 135–145)
Total Bilirubin: 1.1 mg/dL (ref 0.0–1.2)
Total Protein: 5.5 g/dL — ABNORMAL LOW (ref 6.5–8.1)

## 2023-11-13 LAB — CBC
HCT: 30.3 % — ABNORMAL LOW (ref 39.0–52.0)
Hemoglobin: 9.5 g/dL — ABNORMAL LOW (ref 13.0–17.0)
MCH: 30.1 pg (ref 26.0–34.0)
MCHC: 31.4 g/dL (ref 30.0–36.0)
MCV: 95.9 fL (ref 80.0–100.0)
Platelets: 244 K/uL (ref 150–400)
RBC: 3.16 MIL/uL — ABNORMAL LOW (ref 4.22–5.81)
RDW: 15 % (ref 11.5–15.5)
WBC: 9.5 K/uL (ref 4.0–10.5)
nRBC: 0 % (ref 0.0–0.2)

## 2023-11-13 LAB — LIPASE, BLOOD: Lipase: 340 U/L — ABNORMAL HIGH (ref 11–51)

## 2023-11-13 MED ORDER — CALCIUM CARBONATE ANTACID 500 MG PO CHEW
1.0000 | CHEWABLE_TABLET | Freq: Three times a day (TID) | ORAL | Status: DC
  Administered 2023-11-13 – 2023-11-15 (×6): 200 mg via ORAL
  Filled 2023-11-13 (×6): qty 1

## 2023-11-13 MED ORDER — TAMSULOSIN HCL 0.4 MG PO CAPS
0.4000 mg | ORAL_CAPSULE | Freq: Every day | ORAL | Status: DC
  Administered 2023-11-13 – 2023-11-15 (×3): 0.4 mg via ORAL
  Filled 2023-11-13 (×3): qty 1

## 2023-11-13 NOTE — Hospital Course (Addendum)
 73 year old man complicated abdominal history who presented with chills, shakes and feeling of fever.  Admitted for acute pancreatitis as well as pneumonia.  Consultants GI General surgery  Procedures/Events 8/15 admit for pneumonia, acute pancreatitis.

## 2023-11-13 NOTE — Progress Notes (Addendum)
  Progress Note   Patient: Miguel [name] FMW:[id number] DOB: 11-26-1950 DOA: 11/11/2023     1 DOS: the patient was seen and examined on 11/13/2023   Brief hospital course: 73 year old man complicated abdominal history who presented with chills, shakes and feeling of fever.  Admitted for acute pancreatitis as well as pneumonia.  Consultants GI General surgery  Procedures/Events 8/15 admit for pneumonia, acute pancreatitis.  Assessment and Plan: Sepsis Multifocal pneumonia right lung Acute hypoxia without respiratory failure Tachycardia, fever, leukocytosis, source is right-sided community-acquired pneumonia.  Currently patient is hemodynamically stable, lactate is normal. 88% per ED RN note but no documentation of distress. Currently on room air. Recommend follow-up imaging in 8 to 10 weeks to ensure resolution.   Gallstone pancreatitis Gallbladder distended with multiple stones and mild gallbladder wall thickening.  No evidence of acute cholecystitis.  MRCP and CT abdomen pelvis no pancreatitis complicating features.  Multiple small gallstones and sludge within the lumen of the gallbladder.  Mild dilatation of the common bile duct. Will consult general surgery  Acute normocytic anemia Likely dilutional component Trend Hgb. No evidence of bleeding  Hypocalemia Replete, repeat CMP in AM.   Chronic back pain   GERD-Protonix   Aortic atherosclerosis    Subjective:  Feels better Less epigastric pain Breathing fine  Physical Exam: Vitals:   11/12/23 1556 11/12/23 2105 11/13/23 0418 11/13/23 1317  BP: 125/67 132/64 132/74 130/69  Pulse: 75 84 85 85  Resp: 18 16 16 16   Temp: 98.2 F (36.8 C) 99.7 F (37.6 C) 98.8 F (37.1 C) 99.7 F (37.6 C)  TempSrc: Oral Oral Oral Oral  SpO2: 100% 94% 97% 95%  Weight:      Height:       Physical Exam Vitals reviewed.  Constitutional:      General: He is not in acute distress.    Appearance: He is not ill-appearing or  toxic-appearing.  Cardiovascular:     Rate and Rhythm: Normal rate and regular rhythm.     Heart sounds: No murmur heard. Pulmonary:     Effort: Pulmonary effort is normal. No respiratory distress.     Breath sounds: No wheezing, rhonchi or rales.  Abdominal:     General: There is no distension.     Palpations: Abdomen is soft.     Tenderness: There is abdominal tenderness (mild epigastric and RUQ pain).  Neurological:     Mental Status: He is alert.  Psychiatric:        Mood and Affect: Mood normal.        Behavior: Behavior normal.     Data Reviewed: Na+ 133 AP down to 679 Lipase down to 340 ALT down to 109 AST has normalized Hgb down to 9.5 Ca2+ down to 7.8  Family Communication: wife, son at bedside  Disposition: Status is: Inpatient Remains inpatient appropriate because: pneumonia, acute pancreatits     Time spent: 35 minutes  Author: Toribio Door, MD 11/13/2023 4:50 PM  For on call review [email or web address].

## 2023-11-13 NOTE — Consult Note (Signed)
 Reason for Consult: Gallstone pancreatitis Referring Physician: Jadine TRH  Miguel [name] is an 73 y.o. male.  HPI: This is a 73 year old male recently hospitalized for acute pancreatitis.  His lipase is improving.  Total bilirubin is normalized.  MRCP yesterday showed a mildly dilated common bile duct but no signs of acute cholecystitis or choledocholithiasis.  The patient has a remarkably extensive past abdominal surgical history.  I will attempt to summarize his complex course below:  In 2017, the patient presented with perforated diverticulitis.  Dr. Vicenta Poli performed a Hartman's procedure on 07/05/15.  At that time, he was also noted to have a large hiatal hernia which was not repaired at the time.  Before he could have his colostomy reversed, he presented with an acute upper GI bleed.  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  On 10/30/2015, Dr. Lynwood Pina performed exploratory laparotomy, distal gastrectomy with Billroth II reconstruction with gastrojejunostomy.  During that same hospitalization, on 11/21/2015, Dr. Herlene Bureau performed open reduction of the hiatal hernia with repair of the hiatal hernia.  A duodenostomy tube was placed.  The gastrostomy tube was also placed.  He had a prolonged recovery including a gastrostomy tube that was present for several years.  In 2018, Dr. Poli reverse his colostomy.  At the same time, he performed bilateral fascial releases and primarily closed the fascia.  He developed a recurrence.  On 03/17/2018, Dr. Poli performed an incisional hernia repair with biologic mesh (10 cm x 15 cm).  On 09/28/2018, the patient had bilateral open inguinal hernia repairs with mesh by Dr. Poli.  The patient's gastrostomy tube was finally removed.  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  He developed  another recurrent ventral hernia.  On 10/16/2020, Dr. Poli performed incisional hernia repair with 15 x 20 cm Ventralight synthetic mesh.  This mesh was placed in onlay fashion.  This was apparently the patient's most recent abdominal surgery.  He was most recently evaluated in our office in April of this year by Dr. Lyndel for symptomatic cholelithiasis and elevated liver function test.  He developed symptoms after eating [place] food.  The patient has chronic abdominal pain from his multiple previous abdominal surgeries and sees a pain management specialist.  Dr. Lyndel determined that he was not a good surgical candidate for elective cholecystectomy due to his multiple previous abdominal surgeries and chronic abdominal pain.  The patient presented on 11/12/2023 with acute pancreatitis as well as community-acquired pneumonia.  The patient had gone out to eat and then developed fever, chills, dark urine.  He was found to have leukocytosis, fever, tachycardia, elevated lipase and liver function test with a total bilirubin of 4.3.  CT scan showed pneumonia as well as acute pancreatitis.  He was admitted to the hospital.  MRCP showed a mildly dilated common bile duct but no sign of choledocholithiasis.  The patient's symptoms have improved quickly.  His total bilirubin is back to normal.  His lipase is decreasing.  Currently he is minimally symptomatic.  Currently he is on a clear liquid diet.  GI has been consulted but has no additional recommendations for his management.  Past Medical History:  Diagnosis Date   Anemia    Anxiety    BPH (benign prostatic hyperplasia)    Gastritis    GERD (gastroesophageal reflux disease)  seldom (07/16/2016)   GI bleed due to NSAIDs 10/27/2015   History of blood transfusion 06/2016   post OR/notes 07/15/2016   History of hiatal hernia    History of kidney stones    surgery to remove stone   Hyperlipidemia    Hypertension    no meds    Melanoma  of back (HCC)    mid back   Sigmoid diverticulitis    with perforation    Past Surgical History:  Procedure Laterality Date   COLON SURGERY     sigmoid   COLOSTOMY TAKEDOWN N/A 06/28/2016   Procedure: COLOSTOMY TAKEDOWN;  Surgeon: Vicenta Poli, MD;  Location: [facility];  Service: General;  Laterality: N/A;   CYSTOSCOPY WITH RETROGRADE PYELOGRAM, URETEROSCOPY AND STENT PLACEMENT Bilateral 02/12/2020   Procedure: CYSTOSCOPY WITH BILATERAL RETROGRADE PYELOGRAM,  AND LITHOPEXY;  Surgeon: Rosalind Zachary NOVAK, MD;  Location: [facility] ORS;  Service: Urology;  Laterality: Bilateral;  1 HR   ESOPHAGOGASTRODUODENOSCOPY N/A 10/27/2015   Procedure: ESOPHAGOGASTRODUODENOSCOPY (EGD);  Surgeon: Oliva Boots, MD;  Location: [facility] ENDOS[facility];  Service: Endoscopy;  Laterality: N/A;   ESOPHAGOGASTRODUODENOSCOPY N/A 10/29/2015   Procedure: ESOPHAGOGASTRODUODENOSCOPY (EGD);  Surgeon: Lamar Bunk, MD;  Location: [facility] ENDOSCOPY;  Service: Endoscopy;  Laterality: N/A;   ESOPHAGOGASTRODUODENOSCOPY N/A 10/30/2015   Procedure: ESOPHAGOGASTRODUODENOSCOPY (EGD);  Surgeon: Oliva Boots, MD;  Location: [facility] ENDOSCOPY;  Service: Endoscopy;  Laterality: N/A;   g tube discontinued  04/2020   per patient   GASTROSTOMY TUBE PLACEMENT  11/21/2015   REDUCTION OF HIATAL HERNIA , REPAIR HIATAL HERNIA, RESECTION SMALL BOWEL WITH ANASTOMOSIS, PLACEMENT GASTROSTOMY TUBE, PLACEMENT DUODENOSTOMY TUBE (N/A)   HEMORRHOID BANDING  X 2   HERNIA REPAIR     HIATAL HERNIA REPAIR N/A 11/21/2015   Procedure: REDUCTION OF HIATAL HERNIA , REPAIR HIATAL HERNIA, RESECTION SMALL BOWEL WITH ANASTOMOSIS, PLACEMENT GASTROSTOMY TUBE, PLACEMENT DUODENOSTOMY TUBE;  Surgeon: Herlene Beverley Bureau, MD;  Location: [facility] OR;  Service: General;  Laterality: N/A;   HOLMIUM LASER APPLICATION Right 02/12/2020   Procedure: HOLMIUM LASER APPLICATION;  Surgeon: Rosalind Zachary NOVAK, MD;  Location: [facility]S;  Service: Urology;  Laterality: Right;   INCISIONAL HERNIA REPAIR  06/28/2016    open/notes 07/15/2016   INCISIONAL HERNIA REPAIR  03/17/2018   WITH MESH   INCISIONAL HERNIA REPAIR N/A 03/17/2018   Procedure: INCISIONAL HERNIA REPAIR WITH MESH;  Surgeon: Poli Vicenta, MD;  Location: [facility] OR;  Service: General;  Laterality: N/A;   INCISIONAL HERNIA REPAIR N/A 10/16/2020   Procedure: SHIRLENE HERNIA REPAIR WITH MESH;  Surgeon: Poli Vicenta, MD;  Location: [facility] OR;  Service: General;  Laterality: N/A;   INGUINAL HERNIA REPAIR Bilateral 09/28/2018   INGUINAL HERNIA REPAIR Bilateral 09/28/2018   Procedure: BILATERAL OPEN INGUINAL HERNIA REPAIR WITH MESH;  Surgeon: Poli Vicenta, MD;  Location: [facility] OR;  Service: General;  Laterality: Bilateral;  GENERAL AND TAP BLOCK   INSERTION OF MESH N/A 03/17/2018   Procedure: INSERTION OF MESH;  Surgeon: Poli Vicenta, MD;  [facility] OR;  Service: General;  Laterality: N/A;   INSERTION OF MESH Bilateral 09/28/2018   Procedure: Insertion Of Mesh;  Surgeon: Poli Vicenta, MD;  Location: [facility] OR;  Service: General;  Laterality: Bilateral;   IR CM INJ ANY COLONIC TUBE W/FLUORO  02/04/2017   IR GUIDED DRAIN W CATHETER PLACEMENT  07/06/2016   /NOTES 07/15/2016   IR PATIENT EVAL TECH 0-60 MINS  06/28/2019   IR RADIOLOGIST EVAL & MGMT  07/27/2016   IR RADIOLOGIST EVAL & MGMT  08/17/2016   IR RADIOLOGIST EVAL & MGMT  08/26/2016   IR REPLACE G-TUBE SIMPLE WO FLUORO  07/28/2017   IR REPLACE G-TUBE SIMPLE WO FLUORO  01/31/2018   IR REPLACE G-TUBE SIMPLE WO FLUORO  10/16/2018   IR REPLACE G-TUBE SIMPLE WO FLUORO  03/05/2019   IR REPLACE G-TUBE SIMPLE WO FLUORO  08/22/2019   IR REPLACE G-TUBE SIMPLE WO FLUORO  01/08/2020   IR REPLC GASTRO/COLONIC TUBE PERCUT W/FLUORO  08/04/2016   IR REPLC GASTRO/COLONIC TUBE PERCUT W/FLUORO  01/14/2017   IR US  GUIDE BX ASP/DRAIN  08/04/2016   KNEE CARTILAGE SURGERY Right 1971   opened me up   LAPAROTOMY N/A 07/05/2015   Procedure: PARTIAL SIGMOID COLECTOMY AND COLOSTOMY;  Surgeon: Vicenta Poli, MD;  [facility];  Service: General;  Laterality: N/A;   MELANOMA EXCISION  2001   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 He has quit using smokeless tobacco.  His smokeless tobacco use included snuff. He reports that he does not drink alcohol  and does not use drugs.  Allergies: No Known Allergies  Medications:  Prior to Admission medications   Medication Sig Start Date End Date Taking? Authorizing Provider  acetaminophen  (TYLENOL ) 500 MG tablet Take 1 tablet (500 mg total) by mouth every 8 (eight) hours as needed for mild pain (for pain). Patient taking differently: Take 250-500 mg by mouth every 4 (four) hours as needed for mild pain (pain score 1-3) or fever. 12/05/15  Yes [name], PA-C  AMITIZA 24 MCG capsule Take 24 mcg by mouth 2 (two) times daily with a meal. 09/15/23  Yes [provider]  amLODipine  (NORVASC ) 5 MG tablet Take 5 mg by mouth daily. 12/23/22  Yes [provider]  diclofenac sodium (VOLTAREN) 1 % GEL Apply 2 g topically 3 (three) times daily as needed (joint pain).    Yes [provider]  finasteride  (PROSCAR ) 5 MG tablet Take 5 mg by mouth daily.   Yes [provider]  gabapentin  (NEURONTIN ) 100 MG capsule Take 1 capsule by mouth 3 (three) times daily. 07/06/23  Yes [provider]  LORazepam  (ATIVAN ) 1 MG tablet Take 0.5 mg by mouth daily as needed for anxiety. 05/29/18  Yes [provider]  meloxicam (MOBIC) 15 MG tablet  Take 15 mg by mouth daily as needed.   Yes [provider]  methocarbamol  (ROBAXIN ) 500 MG tablet Take 250-500 mg by mouth every 8 (eight) hours as needed for muscle spasms.  07/11/18  Yes [provider]  Multiple Vitamin (MULTIVITAMIN WITH MINERALS) TABS tablet Take 1 tablet by mouth daily.   Yes [provider]  olmesartan (BENICAR) 20 MG tablet Take 20 mg by mouth daily. 10/27/23  Yes [provider]  oxyCODONE  (OXY IR/ROXICODONE ) 5 MG immediate release tablet Take 1 tablet (5 mg total) by mouth every 6 (six) hours as needed for moderate  pain, severe pain or breakthrough pain. 10/20/20  Yes [name], MD  pantoprazole  (PROTONIX ) 20 MG tablet Take 20 mg by mouth daily. 01/18/18  Yes [provider]  polyethylene glycol (MIRALAX / GLYCOLAX) 17 g packet Take 17 g by mouth daily as needed for mild constipation.   Yes [provider]  Probiotic Product (PROBIOTIC DAILY PO) Take 1 capsule by mouth daily.   Yes [provider]  senna (SENOKOT) 8.6 MG tablet Take 2 tablets by mouth at bedtime.   Yes [provider]  tamsulosin  (FLOMAX ) 0.4 MG CAPS capsule Take 1 capsule (0.4 mg total) by mouth daily. 01/04/16  Yes Gail Favorite, MD  traMADol  (ULTRAM ) 50 MG tablet Take 50 mg by mouth every 6 (six) hours as needed for moderate pain. 05/21/16  Yes [provider]  vitamin B-12 (CYANOCOBALAMIN) 1000 MCG tablet Take 1,000 mcg by mouth daily.    Yes [provider]  zolpidem  (AMBIEN ) 10 MG tablet Take 10 mg by mouth at bedtime.   Yes [provider]  diazepam  (VALIUM ) 5 MG tablet Take one tablet by mouth with food one hour prior to procedure. May repeat 30 minutes prior if needed. Patient not taking: Reported on 11/12/2023 04/14/21   [name], [name], NP  famotidine  (PEPCID ) 20 MG tablet Take 20 mg by mouth 2 (two) times daily.  Patient not taking: Reported on 11/12/2023    [provider]   HYDROcodone -acetaminophen  (NORCO/VICODIN) 5-325 MG tablet Take 1 tablet by mouth 2 (two) times daily as needed for moderate pain. Patient not taking: Reported on 11/12/2023 07/06/21   [name], MD  tiZANidine (ZANAFLEX) 2 MG tablet Take 1-2 mg by mouth 2 (two) times daily as needed for muscle spasms. Patient not taking: Reported on 11/12/2023 01/16/20   [provider]     Results for orders placed or performed during the hospital encounter of 11/11/23 (from the past 48 hours)  CBG monitoring, ED     Status: Abnormal   Collection Time: 11/12/23 12:01 AM  Result Value Ref Range   Glucose-Capillary 104 (H) 70 - 99 mg/dL    Comment: Glucose reference range applies only to samples taken after fasting for at least 8 hours.  Lactic acid, plasma     Status: None   Collection Time: 11/12/23 12:04 AM  Result Value Ref Range   Lactic Acid, Venous 1.5 0.5 - 1.9 mmol/L    Comment: Performed at [facility], [address] [place], [place] [address]  Comprehensive metabolic panel     Status: Abnormal   Collection Time: 11/12/23 12:04 AM  Result Value Ref Range   Sodium 136 135 - 145 mmol/L   Potassium 4.3 3.5 - 5.1 mmol/L   Chloride 100 98 - 111 mmol/L   CO2 21 (L) 22 - 32 mmol/L   Glucose, Bld 101 (H) 70 - 99 mg/dL    Comment: Glucose reference range applies only to samples taken after fasting for at least 8 hours.   BUN 17 8 - 23 mg/dL   Creatinine, Ser 9.00 0.61 - 1.24 mg/dL   Calcium  9.3 8.9 - 10.3 mg/dL   Total Protein 6.9 6.5 - 8.1 g/dL   Albumin  4.0 3.5 - 5.0 g/dL   AST 878 (H) 15 - 41 U/L   ALT 263 (H) 0 - 44 U/L   Alkaline Phosphatase 1,409 (H) 38 - 126 U/L   Total Bilirubin 4.3 (H) 0.0 - 1.2 mg/dL   GFR, Estimated >39 >  60 mL/min    Comment: (NOTE) Calculated using the CKD-EPI Creatinine Equation (2021)    Anion gap 15 5 - 15    Comment: Performed at [facility], [address] [place], [place] [address]  CBC with  Differential     Status: Abnormal   Collection Time: 11/12/23 12:04 AM  Result Value Ref Range   WBC 11.3 (H) 4.0 - 10.5 K/uL   RBC 3.99 (L) 4.22 - 5.81 MIL/uL   Hemoglobin 12.4 (L) 13.0 - 17.0 g/dL   HCT 62.5 (L) 60.9 - 47.9 %   MCV 93.7 80.0 - 100.0 fL   MCH 31.1 26.0 - 34.0 pg   MCHC 33.2 30.0 - 36.0 g/dL   RDW 84.9 88.4 - 84.4 %   Platelets 268 150 - 400 K/uL   nRBC 0.0 0.0 - 0.2 %   Neutrophils Relative % 86 %   Neutro Abs 9.6 (H) 1.7 - 7.7 K/uL   Lymphocytes Relative 11 %   Lymphs Abs 1.2 0.7 - 4.0 K/uL   Monocytes Relative 3 %   Monocytes Absolute 0.4 0.1 - 1.0 K/uL   Eosinophils Relative 0 %   Eosinophils Absolute 0.0 0.0 - 0.5 K/uL   Basophils Relative 0 %   Basophils Absolute 0.0 0.0 - 0.1 K/uL   Immature Granulocytes 0 %   Abs Immature Granulocytes 0.02 0.00 - 0.07 K/uL    Comment: Performed at [facility], [address], [place], [place] [address]  Protime-INR     Status: None   Collection Time: 11/12/23 12:04 AM  Result Value Ref Range   Prothrombin Time 13.7 11.4 - 15.2 seconds   INR 1.0 0.8 - 1.2    Comment: (NOTE) INR goal varies based on device and disease states. Performed at [facility], [address], [place], [place] [address]   Blood Culture (routine x 2)     Status: None (Preliminary result)   Collection Time: 11/12/23 12:04 AM   Specimen: BLOOD  Result Value Ref Range   Specimen Description      BLOOD LEFT ANTECUBITAL Performed at [facility], [address], [place], [place] [address]    Special Requests      BOTTLES DRAWN AEROBIC AND ANAEROBIC Blood Culture adequate volume Performed at [facility], [address] [place], [place] [address]    Culture  Setup Time      GRAM POSITIVE RODS IN BOTH AEROBIC AND ANAEROBIC BOTTLES CRITICAL RESULT CALLED TO, READ BACK BY AND VERIFIED WITH: PHARMD M LILLISTON 11/12/2023 @ 2335 BY AB    Culture      GRAM POSITIVE  RODS IDENTIFICATION TO FOLLOW Performed at [facility], [address] [place], [place] [address]    Report Status PENDING   Lipase, blood     Status: Abnormal   Collection Time: 11/12/23 12:04 AM  Result Value Ref Range   Lipase 1,124 (H) 11 - 51 U/L    Comment: Performed at [facility], [address] [place], [place] [address]  Blood Culture (routine x 2)     Status: None (Preliminary result)   Collection Time: 11/12/23 12:09 AM   Specimen: BLOOD RIGHT HAND  Result Value Ref Range   Specimen Description      BLOOD RIGHT HAND Performed at [facility], [address], [place], [place] [address]    Special Requests      BOTTLES DRAWN AEROBIC AND ANAEROBIC Blood Culture results may  not be optimal due to an inadequate volume of blood received in culture bottles Performed at [facility], [address] [place], [place] [address]    Culture      NO GROWTH < 24 HOURS Performed at [facility], [address], [place], [place] [address]    Report Status PENDING   Resp panel by RT-PCR (RSV, Flu A&B, Covid) Anterior Nasal Swab     Status: None   Collection Time: 11/12/23 12:24 AM   Specimen: Anterior Nasal Swab  Result Value Ref Range   SARS Coronavirus 2 by RT PCR NEGATIVE NEGATIVE    Comment: (NOTE) SARS-CoV-2 target nucleic acids are NOT DETECTED.  The SARS-CoV-2 RNA is generally detectable in upper respiratory specimens during the acute phase of infection. The lowest concentration of SARS-CoV-2 viral copies this assay can detect is 138 copies/mL. A negative result does not preclude SARS-Cov-2 infection and should not be used as the sole basis for treatment or other patient management decisions. A negative result may occur with  improper specimen collection/handling, submission of specimen other than nasopharyngeal swab, presence of viral mutation(s) within the areas targeted by this assay, and inadequate number of  viral copies(<138 copies/mL). A negative result must be combined with clinical observations, patient history, and epidemiological information. The expected result is Negative.  Fact Sheet for Patients:  BloggerCourse.com  Fact Sheet for Healthcare Providers:  SeriousBroker.it  This test is no t yet approved or cleared by the [place]  FDA and  has been authorized for detection and/or diagnosis of SARS-CoV-2 by FDA under an Emergency Use Authorization (EUA). This EUA will remain  in effect (meaning this test can be used) for the duration of the COVID-19 declaration under Section 564(b)(1) of the Act, 21 U.S.C.section 360bbb-3(b)(1), unless the authorization is terminated  or revoked sooner.       Influenza A by PCR NEGATIVE NEGATIVE   Influenza B by PCR NEGATIVE NEGATIVE    Comment: (NOTE) The Xpert Xpress SARS-CoV-2/FLU/RSV plus assay is intended as an aid in the diagnosis of influenza from Nasopharyngeal swab specimens and should not be used as a sole basis for treatment. Nasal washings and aspirates are unacceptable for Xpert Xpress SARS-CoV-2/FLU/RSV testing.  Fact Sheet for Patients: BloggerCourse.com  Fact Sheet for Healthcare Providers: SeriousBroker.it  This test is not yet approved or cleared by the [place]  FDA and has been authorized for detection and/or diagnosis of SARS-CoV-2 by FDA under an Emergency Use Authorization (EUA). This EUA will remain in effect (meaning this test can be used) for the duration of the COVID-19 declaration under Section 564(b)(1) of the Act, 21 U.S.C. section 360bbb-3(b)(1), unless the authorization is terminated or revoked.     Resp Syncytial Virus by PCR NEGATIVE NEGATIVE    Comment: (NOTE) Fact Sheet for Patients: BloggerCourse.com  Fact Sheet for Healthcare  Providers: SeriousBroker.it  This test is not yet approved or cleared by the [place]  FDA and has been authorized for detection and/or diagnosis of SARS-CoV-2 by FDA under an Emergency Use Authorization (EUA). This EUA will remain in effect (meaning this test can be used) for the duration of the COVID-19 declaration under Section 564(b)(1) of the Act, 21 U.S.C. section 360bbb-3(b)(1), unless the authorization is terminated or revoked.  Performed at [facility], [address] [place], [place] [address]   Urinalysis, w/ Reflex to Culture (Infection Suspected) -Urine, Clean Catch     Status: Abnormal   Collection Time: 11/12/23 12:24 AM  Result  Value Ref Range   Specimen Source URINE, CLEAN CATCH    Color, Urine YELLOW YELLOW   APPearance CLEAR CLEAR   Specific Gravity, Urine 1.020 1.005 - 1.030   pH 5.5 5.0 - 8.0   Glucose, UA NEGATIVE NEGATIVE mg/dL   Hgb urine dipstick NEGATIVE NEGATIVE   Bilirubin Urine SMALL (A) NEGATIVE   Ketones, ur NEGATIVE NEGATIVE mg/dL   Protein, ur TRACE (A) NEGATIVE mg/dL   Nitrite NEGATIVE NEGATIVE   Leukocytes,Ua NEGATIVE NEGATIVE   RBC / HPF 0-5 0 - 5 RBC/hpf   WBC, UA 0-5 0 - 5 WBC/hpf    Comment:        Reflex urine culture not performed if WBC <=10, OR if Squamous epithelial cells >5. If Squamous epithelial cells >5 suggest recollection.    Bacteria, UA NONE SEEN NONE SEEN   Squamous Epithelial / HPF 0-5 0 - 5 /HPF   Mucus PRESENT    Hyaline Casts, UA PRESENT     Comment: Performed at [facility], [address] [place], [place] [address]  Lactic acid, plasma     Status: None   Collection Time: 11/12/23  3:30 AM  Result Value Ref Range   Lactic Acid, Venous 0.8 0.5 - 1.9 mmol/L    Comment: Performed at [facility], [address], [place], [place] [address]  CBC     Status: Abnormal   Collection Time: 11/13/23  5:02 AM  Result Value Ref  Range   WBC 9.5 4.0 - 10.5 K/uL   RBC 3.16 (L) 4.22 - 5.81 MIL/uL   Hemoglobin 9.5 (L) 13.0 - 17.0 g/dL   HCT 69.6 (L) 60.9 - 47.9 %   MCV 95.9 80.0 - 100.0 fL   MCH 30.1 26.0 - 34.0 pg   MCHC 31.4 30.0 - 36.0 g/dL   RDW 84.9 88.4 - 84.4 %   Platelets 244 150 - 400 K/uL   nRBC 0.0 0.0 - 0.2 %    Comment: Performed at [facility], [address], [place], [place] [address]  Comprehensive metabolic panel     Status: Abnormal   Collection Time: 11/13/23  5:02 AM  Result Value Ref Range   Sodium 133 (L) 135 - 145 mmol/L   Potassium 3.5 3.5 - 5.1 mmol/L   Chloride 100 98 - 111 mmol/L   CO2 26 22 - 32 mmol/L   Glucose, Bld 86 70 - 99 mg/dL    Comment: Glucose reference range applies only to samples taken after fasting for at least 8 hours.   BUN 14 8 - 23 mg/dL   Creatinine, Ser 9.25 0.61 - 1.24 mg/dL   Calcium  7.8 (L) 8.9 - 10.3 mg/dL   Total Protein 5.5 (L) 6.5 - 8.1 g/dL   Albumin  2.5 (L) 3.5 - 5.0 g/dL   AST 30 15 - 41 U/L   ALT 109 (H) 0 - 44 U/L   Alkaline Phosphatase 679 (H) 38 - 126 U/L   Total Bilirubin 1.1 0.0 - 1.2 mg/dL   GFR, Estimated >39 >39 mL/min    Comment: (NOTE) Calculated using the CKD-EPI Creatinine Equation (2021)    Anion gap 7 5 - 15    Comment: Performed at [facility], [address] [place], [place] [address]  Lipase, blood     Status: Abnormal   Collection Time: 11/13/23  5:02 AM  Result Value Ref Range   Lipase 340 (H) 11 - 51 U/L    Comment: Performed  at [facility], [address], [place], [place] [address]    MR ABDOMEN MRCP W WO CONTAST Result Date: 11/12/2023 CLINICAL DATA:  Acute pancreatitis. Distended gall bladder. Gallstones. EXAM: MRI ABDOMEN WITHOUT AND WITH CONTRAST (INCLUDING MRCP) TECHNIQUE: Multiplanar multisequence MR imaging of the abdomen was performed both before and after the administration of intravenous contrast. Heavily T2-weighted images of the biliary and  pancreatic ducts were obtained, and three-dimensional MRCP images were rendered by post processing. CONTRAST:  6mL GADAVIST  GADOBUTROL  1 MMOL/ML IV SOLN COMPARISON:  CT 11/12/2023 FINDINGS: Lower chest:  Small bilateral pleural effusions Hepatobiliary: No intrahepatic biliary duct dilatation. The common bile duct mildly dilated at 8 mm (image 16/series 3. There are no filling defects within common bile to suggest choledocholithiasis. There are several dependent small gallstones within the lumen of the gallbladder (image 31/series 4. These are low signal intensity on T2 weighted imaging. Again no low signal intensity lesion identified distal bile no gallbladder wall thickening or pericholecystic Pancreas: Pancreas enhance uniformly. There is inflammation in the retroperitoneal fat surrounding the body and tail of the pancreas. This fluid extends along the anterior LEFT pararenal space. Pancreatic duct is nondilated. No organized fluid collections. No variant pancreatic ductal anatomy. Spleen: Normal spleen. Adrenals/urinary tract: Adrenal glands and kidneys are normal. Stomach/Bowel: Adrenal glands normal. Benign renal cysts of the RIGHT kidney. Vascular/Lymphatic: Abdominal aortic normal caliber. No retroperitoneal periportal lymphadenopathy. Musculoskeletal: No aggressive osseous lesion IMPRESSION: 1. Acute pancreatitis. No organized fluid collections. 2. No choledocholithiasis. 3. Multiple small gallstones and sludge within the lumen of the gallbladder. No evidence of cholecystitis. 4. Mild dilatation of the common bile duct. 5. Small bilateral pleural effusions. Electronically Signed   By: Jackquline Boxer M.D.   On: 11/12/2023 14:57   MR 3D Recon At Scanner Result Date: 11/12/2023 CLINICAL DATA:  Acute pancreatitis. Distended gall bladder. Gallstones. EXAM: MRI ABDOMEN WITHOUT AND WITH CONTRAST (INCLUDING MRCP) TECHNIQUE: Multiplanar multisequence MR imaging of the abdomen was performed both before and after  the administration of intravenous contrast. Heavily T2-weighted images of the biliary and pancreatic ducts were obtained, and three-dimensional MRCP images were rendered by post processing. CONTRAST:  6mL GADAVIST  GADOBUTROL  1 MMOL/ML IV SOLN COMPARISON:  CT 11/12/2023 FINDINGS: Lower chest:  Small bilateral pleural effusions Hepatobiliary: No intrahepatic biliary duct dilatation. The common bile duct mildly dilated at 8 mm (image 16/series 3. There are no filling defects within common bile to suggest choledocholithiasis. There are several dependent small gallstones within the lumen of the gallbladder (image 31/series 4. These are low signal intensity on T2 weighted imaging. Again no low signal intensity lesion identified distal bile no gallbladder wall thickening or pericholecystic Pancreas: Pancreas enhance uniformly. There is inflammation in the retroperitoneal fat surrounding the body and tail of the pancreas. This fluid extends along the anterior LEFT pararenal space. Pancreatic duct is nondilated. No organized fluid collections. No variant pancreatic ductal anatomy. Spleen: Normal spleen. Adrenals/urinary tract: Adrenal glands and kidneys are normal. Stomach/Bowel: Adrenal glands normal. Benign renal cysts of the RIGHT kidney. Vascular/Lymphatic: Abdominal aortic normal caliber. No retroperitoneal periportal lymphadenopathy. Musculoskeletal: No aggressive osseous lesion IMPRESSION: 1. Acute pancreatitis. No organized fluid collections. 2. No choledocholithiasis. 3. Multiple small gallstones and sludge within the lumen of the gallbladder. No evidence of cholecystitis. 4. Mild dilatation of the common bile duct. 5. Small bilateral pleural effusions. Electronically Signed   By: Jackquline Boxer M.D.   On: 11/12/2023 14:57   CT CHEST ABDOMEN PELVIS W CONTRAST Result Date:  11/12/2023 CLINICAL DATA:  Sepsis EXAM: CT CHEST, ABDOMEN, AND PELVIS WITH CONTRAST TECHNIQUE: Multidetector CT imaging of the chest, abdomen  and pelvis was performed following the standard protocol during bolus administration of intravenous contrast. RADIATION DOSE REDUCTION: This exam was performed according to the departmental dose-optimization program which includes automated exposure control, adjustment of the mA and/or kV according to patient size and/or use of iterative reconstruction technique. CONTRAST:  80mL OMNIPAQUE  IOHEXOL  300 MG/ML  SOLN COMPARISON:  Same day radiograph; CT chest 10/07/2023; CT abdomen pelvis 09/30/2023 FINDINGS: CT CHEST FINDINGS Cardiovascular: No pericardial effusion. Normal caliber thoracic aorta. Coronary artery and aortic atherosclerotic calcification. Mediastinum/Nodes: Trachea is unremarkable. Wall thickening about the distal esophagus. No pathologic adenopathy. Lungs/Pleura: Consolidative and ground-glass opacities right upper and middle lobes along the minor fissure and right lower lobe. This is compatible with multifocal pneumonia. Follow-up in 8-10 weeks is recommended to ensure resolution. No pleural effusion or pneumothorax. Musculoskeletal: No acute fracture. CT ABDOMEN PELVIS FINDINGS Hepatobiliary: Periportal edema. The liver is otherwise unremarkable. Distended gallbladder with multiple stones. Mild gallbladder wall thickening. No biliary dilation. Pancreas: Edema and stranding about the pancreas compatible with pancreatitis. No ductal dilation no organized fluid collection. No evidence of pancreatic necrosis. Spleen: Unremarkable. Adrenals/Urinary Tract: Stable adrenal glands. No urinary calculi or hydronephrosis. Scarring in the right kidney. Chronic cystic dilation of the distal ureters just proximal to the UVJ is bilaterally is unchanged. Unremarkable bladder. Stomach/Bowel: No bowel obstruction. Postoperative change about the stomach. Postoperative change about the colon in the pelvis. Wall thickening about the stomach likely reactive secondary to pancreatitis. Vascular/Lymphatic: Mild aortic  atherosclerotic calcification. 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 No lymphadenopathy. Reproductive: Enlarged prostate. Other: No free intraperitoneal fluid or air. Musculoskeletal: No acute fracture. IMPRESSION: 1. Acute pancreatitis. No evidence of pancreatic necrosis or organized fluid collection. 2. Multifocal pneumonia in the right lung. Follow-up in 8-10 weeks is recommended to ensure resolution. 3. Distended gallbladder with multiple stones and mild gallbladder wall thickening. Findings likely secondary to pancreatitis. 4. Wall thickening about the distal esophagus and stomach likely reactive secondary to pancreatitis. 5. Aortic Atherosclerosis (ICD10-I70.0). Electronically Signed   By: Norman Gatlin M.D.   On: 11/12/2023 02:11   CT HEAD WO CONTRAST ( ) Result Date: 11/12/2023 EXAM: CT HEAD WITHOUT CONTRAST 11/12/2023 12:45:51 AM TECHNIQUE: CT of the head was performed without the administration of intravenous contrast. Automated exposure control, iterative reconstruction, and/or weight based adjustment of the mA/kV was utilized to reduce the radiation dose to as low as reasonably achievable. COMPARISON: 01/14/2023 CLINICAL HISTORY: Mental status change, unknown cause. Pt BIB wife from home due to shaking. Per wife patient is more altered than usual. Pt reports it was a sudden onset 1hr PTA. FINDINGS: BRAIN AND VENTRICLES: No acute hemorrhage. Gray-white differentiation is preserved. No hydrocephalus. No extra-axial collection. No mass effect or midline shift. ORBITS: No acute abnormality. SINUSES: No acute abnormality. SOFT TISSUES AND SKULL: No acute soft tissue abnormality. No skull fracture. IMPRESSION: 1. No acute intracranial abnormality. Electronically signed by: Franky Stanford MD 11/12/2023 01:07 AM EDT RP Workstation: [id number]   DG Chest Port 1 View Result Date: 11/12/2023 CLINICAL DATA:  Questionable sepsis - evaluate for abnormality. Altered mental status. EXAM:  PORTABLE CHEST 1 VIEW COMPARISON:  07/14/2023 FINDINGS: Heart is upper limits normal in size. Mediastinal contours within normal limits. Left lung clear. Patchy opacity in the right mid lung. No effusions. No acute bony abnormality. IMPRESSION: Patchy opacity in the right mid lung could reflect early infiltrate.  Electronically Signed   By: Franky Crease M.D.   On: 11/12/2023 00:56    Review of Systems  Constitutional:  Positive for chills and fever.  HENT:  Negative for ear discharge, ear pain, hearing loss and tinnitus.   Eyes:  Negative for photophobia and pain.  Respiratory:  Negative for cough and shortness of breath.   Cardiovascular:  Negative for chest pain.  Gastrointestinal:  Positive for abdominal pain and nausea. Negative for vomiting.  Genitourinary:  Negative for dysuria, flank pain, frequency and urgency.  Musculoskeletal:  Negative for back pain, myalgias and neck pain.  Neurological:  Negative for dizziness and headaches.  Hematological:  Does not bruise/bleed easily.  Psychiatric/Behavioral:  The patient is not nervous/anxious.    Blood pressure 130/69, pulse 85, temperature 99.7 F (37.6 C), temperature source Oral, resp. rate 16, height 5' 6 (1.676 m), weight 61.6 kg, SpO2 95%. Physical Exam Constitutional:  WDWN in NAD, conversant, no obvious deformities; sitting in chair comfortably Eyes:  Pupils equal, round; sclera anicteric; moist conjunctiva; no lid lag HENT:  Oral mucosa moist; good dentition  Neck:  No masses palpated, trachea midline; no thyromegaly Lungs:  CTA bilaterally; normal respiratory effort CV:  Regular rate and rhythm; no murmurs; extremities well-perfused with no edema Abd:  +bowel sounds, soft, mildly tender in LUQ Healed midline laparotomy incision.  The patient has a very obvious bulge in his right lower quadrant.  This bulge is reducible.  In the left mid abdomen, the patient has firmness that is easily palpated with suture or foreign body  material.  He has mild tenderness in the left upper quadrant. 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  MRCP does reveal small layering stones and sludge within the gallbladder.  I had a very long extensive discussion with the patient and his wife regarding his history as well as his current situation.  Currently the acute pancreatitis seems to be resolving.  There is no sign of choledocholithiasis.  He has no signs of acute cholecystitis.  His symptoms are improving.  There is no indication for any urgent intervention.  The patient already has chronic abdominal pain from multiple previous surgeries.  Minimally invasive surgical intervention would be very difficult and unlikely due to the amount of adhesions and previous mesh in the abdomen.  Cholecystectomy would likely require open surgery which could result in more hernias, injury to small bowel or colon resulting in enterocutaneous fistulas, or worsening of his chronic abdominal pain.  Clinically, the patient is improving and there are no indications for any urgent intervention at this time.  If he develops acute cholecystitis, would consider percutaneous cholecystostomy tube placement by interventional radiology.  If he develops choledocholithiasis with obstruction  or recurrent pancreatitis, ERCP would be difficult due to his current anatomy.  He could possibly be a candidate for transhepatic stent placement by interventional radiology if necessary.  After long discussion  and answering their questions, our recommendations are to not pursue surgical intervention at this time since his symptoms seem to be resolving.  He understands the risk of possible recurrence of acute cholecystitis, choledocholithiasis with obstruction, or pancreatitis.  At this time, it seems that the benefits of surgery would be outweighed by the risks.  We will continue to follow his clinical status as well as his laboratory values during this hospitalization.  Donnice POUR Yannet Rincon 11/13/2023, 6:05 PM    Consult time - 75 minutes

## 2023-11-13 NOTE — Evaluation (Signed)
 Physical Therapy Evaluation Patient Details Name: Miguel [name] MRN: [id number] DOB: 1951-01-13 Today's Date: 11/13/2023  History of Present Illness  Miguel [name] is a 73 y.o. male admitted to the hospital for community-acquired pneumonia, acute pancreatitis,cholelithiasis, and elevated bilirubin. PHMx:  hypertension, hyperlipidemia, anemia, anxiety, melanoma of back  Clinical Impression  Pt admitted as above and presenting with functional mobility limitations 2* mild balance deficits and generalized weakness.  Pt reports feeling better this am but that decision regarding possible surgery has not been made at this time.  Pt should progress well to dc home with family assist.        If plan is discharge home, recommend the following:     Can travel by private vehicle        Equipment Recommendations None recommended by PT  Recommendations for Other Services       Functional Status Assessment Patient has had a recent decline in their functional status and demonstrates the ability to make significant improvements in function in a reasonable and predictable amount of time.     Precautions / Restrictions Precautions Precautions: None Restrictions Weight Bearing Restrictions Per Provider Order: No      Mobility  Bed Mobility Overal bed mobility: Independent                  Transfers Overall transfer level: Needs assistance Equipment used: None Transfers: Sit to/from Stand Sit to Stand: Supervision           General transfer comment: mild instability but no LOB    Ambulation/Gait Ambulation/Gait assistance: Contact guard assist, Supervision Gait Distance (Feet): 450 Feet Assistive device: 1 person hand held assist, None Gait Pattern/deviations: Step-through pattern, Decreased step length - right, Decreased step length - left, Shuffle, Narrow base of support Gait velocity: decr     General Gait Details: mild instability with no overt LOB and with  improvement noted with increased distance.  Stairs            Wheelchair Mobility     Tilt Bed    Modified Rankin (Stroke Patients Only)       Balance Overall balance assessment: Mild deficits observed, not formally tested                                           Pertinent Vitals/Pain Pain Assessment Pain Assessment: No/denies pain    Home Living Family/patient expects to be discharged to:: Private residence Living Arrangements: Spouse/significant other Available Help at Discharge: Family;Available 24 hours/day Type of Home: House Home Access: Stairs to enter Entrance Stairs-Rails: Right;Left;Can reach both Entrance Stairs-Number of Steps: 2   Home Layout: One level Home Equipment: Hand held shower head;Shower seat - built in      Prior Function Prior Level of Function : Independent/Modified Independent;Driving                     Extremity/Trunk Assessment   Upper Extremity Assessment Upper Extremity Assessment: Overall WFL for tasks assessed    Lower Extremity Assessment Lower Extremity Assessment: Overall WFL for tasks assessed       Communication   Communication Communication: No apparent difficulties    Cognition Arousal: Alert Behavior During Therapy: WFL for tasks assessed/performed  Following commands: Intact       Cueing Cueing Techniques: Verbal cues     General Comments      Exercises     Assessment/Plan    PT Assessment Patient needs continued PT services  PT Problem List Decreased balance;Decreased mobility       PT Treatment Interventions DME instruction;Gait training;Stair training;Functional mobility training;Therapeutic activities;Therapeutic exercise;Patient/family education;Balance training    PT Goals (Current goals can be found in the Care Plan section)  Acute Rehab PT Goals Patient Stated Goal: Regain IND PT Goal Formulation: With  patient Time For Goal Achievement: 11/27/23 Potential to Achieve Goals: Good    Frequency Min 2X/week     Co-evaluation               AM-PAC PT 6 Clicks Mobility  Outcome Measure Help needed turning from your back to your side while in a flat bed without using bedrails?: None Help needed moving from lying on your back to sitting on the side of a flat bed without using bedrails?: None Help needed moving to and from a bed to a chair (including a wheelchair)?: None Help needed standing up from a chair using your arms (e.g., wheelchair or bedside chair)?: A Little Help needed to walk in hospital room?: A Little Help needed climbing 3-5 steps with a railing? : A Little 6 Click Score: 21    End of Session Equipment Utilized During Treatment: Gait belt Activity Tolerance: Patient tolerated treatment well;No increased pain Patient left: in chair;with call bell/phone within reach;with family/visitor present Nurse Communication: Mobility status PT Visit Diagnosis: Difficulty in walking, not elsewhere classified (R26.2)    Time: 8966-8948 PT Time Calculation (min) (ACUTE ONLY): 18 min   Charges:   PT Evaluation $PT Eval Low Complexity: 1 Low   PT General Charges $$ ACUTE PT VISIT: 1 Visit         [facility] PT Acute Rehabilitation Services Office [phone]   Letricia Krinsky 11/13/2023, 12:52 PM

## 2023-11-13 NOTE — Progress Notes (Signed)
 [facility] Progress Note  SUBJECTIVE:   Interval history: Miguel [name] was seen and evaluated today at bedside. Resting in chair. Abdominal pain improved. No chest pain or shortness of breath. Some MSK pain from being in bed.   Past Medical History:  Diagnosis Date   Anemia    Anxiety    BPH (benign prostatic hyperplasia)    Gastritis    GERD (gastroesophageal reflux disease)    seldom (07/16/2016)   GI bleed due to NSAIDs 10/27/2015   History of blood transfusion 06/2016   post OR/notes 07/15/2016   History of hiatal hernia    History of kidney stones    surgery to remove stone   Hyperlipidemia    Hypertension    no meds    Melanoma of back (HCC)    mid back   Sigmoid diverticulitis    with perforation   Past Surgical History:  Procedure Laterality Date   COLON SURGERY     sigmoid   COLOSTOMY TAKEDOWN N/A 06/28/2016   Procedure: COLOSTOMY TAKEDOWN;  Surgeon: Vicenta Poli, MD;  Location: [facility] OR;  Service: General;  Laterality: N/A;   CYSTOSCOPY WITH RETROGRADE PYELOGRAM, URETEROSCOPY AND STENT PLACEMENT Bilateral 02/12/2020   Procedure: CYSTOSCOPY WITH BILATERAL RETROGRADE PYELOGRAM,  AND LITHOPEXY;  Surgeon: Rosalind Zachary NOVAK, MD;  Location: [facility] ORS;  Service: Urology;  Laterality: Bilateral;  1 HR   ESOPHAGOGASTRODUODENOSCOPY N/A 10/27/2015   Procedure: ESOPHAGOGASTRODUODENOSCOPY (EGD);  Surgeon: Oliva Boots, MD;  Location: [facility] ENDOSCOPY;  Service: Endoscopy;  Laterality: N/A;   ESOPHAGOGASTRODUODENOSCOPY N/A 10/29/2015   Procedure: ESOPHAGOGASTRODUODENOSCOPY (EGD);  Surgeon: Lamar Bunk, MD;  Location: [facility] ENDOSCOPY;  Service: Endoscopy;  Laterality: N/A;   ESOPHAGOGASTRODUODENOSCOPY N/A 10/30/2015   Procedure: ESOPHAGOGASTRODUODENOSCOPY (EGD);  Surgeon: Oliva Boots, MD;  Location: [facility] ENDOSCOPY;  Service: Endoscopy;  Laterality: N/A;   g tube discontinued  04/2020   per patient   GASTROSTOMY TUBE PLACEMENT  11/21/2015   REDUCTION OF HIATAL HERNIA , REPAIR  HIATAL HERNIA, RESECTION SMALL BOWEL WITH ANASTOMOSIS, PLACEMENT GASTROSTOMY TUBE, PLACEMENT DUODENOSTOMY TUBE (N/A)   HEMORRHOID BANDING  X 2   HERNIA REPAIR     HIATAL HERNIA REPAIR N/A 11/21/2015   Procedure: REDUCTION OF HIATAL HERNIA , REPAIR HIATAL HERNIA, RESECTION SMALL BOWEL WITH ANASTOMOSIS, PLACEMENT GASTROSTOMY TUBE, PLACEMENT DUODENOSTOMY TUBE;  Surgeon: Herlene Beverley Bureau, MD;  Location: [facility] OR;  Service: General;  Laterality: N/A;   HOLMIUM LASER APPLICATION Right 02/12/2020   Procedure: HOLMIUM LASER APPLICATION;  Surgeon: Rosalind Zachary NOVAK, MD;  Location: [facility]S;  Service: Urology;  Laterality: Right;   INCISIONAL HERNIA REPAIR  06/28/2016   open/notes 07/15/2016   INCISIONAL HERNIA REPAIR  03/17/2018   WITH MESH   INCISIONAL HERNIA REPAIR N/A 03/17/2018   Procedure: INCISIONAL HERNIA REPAIR WITH MESH;  Surgeon: Poli Vicenta, MD;  Location: [facility] OR;  Service: General;  Laterality: N/A;   INCISIONAL HERNIA REPAIR N/A 10/16/2020   Procedure: SHIRLENE HERNIA REPAIR WITH MESH;  Surgeon: Poli Vicenta, MD;  Location: [facility] OR;  Service: General;  Laterality: N/A;   INGUINAL HERNIA REPAIR Bilateral 09/28/2018   INGUINAL HERNIA REPAIR Bilateral 09/28/2018   Procedure: BILATERAL OPEN INGUINAL HERNIA REPAIR WITH MESH;  Surgeon: Poli Vicenta, MD;  Location: [facility] OR;  Service: General;  Laterality: Bilateral;  GENERAL AND TAP BLOCK   INSERTION OF MESH N/A 03/17/2018   Procedure: INSERTION OF MESH;  Surgeon: Poli Vicenta, MD;  Location: [facility] OR;  Service: General;  Laterality: N/A;   INSERTION OF MESH Bilateral  09/28/2018   Procedure: Insertion Of Mesh;  Surgeon: [name], MD;  Location: [facility] OR;  Service: General;  Laterality: Bilateral;   IR CM INJ ANY COLONIC TUBE W/FLUORO  02/04/2017   IR GUIDED DRAIN W CATHETER PLACEMENT  07/06/2016   /NOTES 07/15/2016   IR PATIENT EVAL TECH 0-60 MINS  06/28/2019   IR RADIOLOGIST EVAL & MGMT  07/27/2016   IR RADIOLOGIST EVAL & MGMT   08/17/2016   IR RADIOLOGIST EVAL & MGMT  08/26/2016   IR REPLACE G-TUBE SIMPLE WO FLUORO  07/28/2017   IR REPLACE G-TUBE SIMPLE WO FLUORO  01/31/2018   IR REPLACE G-TUBE SIMPLE WO FLUORO  10/16/2018   IR REPLACE G-TUBE SIMPLE WO FLUORO  03/05/2019   IR REPLACE G-TUBE SIMPLE WO FLUORO  08/22/2019   IR REPLACE G-TUBE SIMPLE WO FLUORO  01/08/2020   IR REPLC GASTRO/COLONIC TUBE PERCUT W/FLUORO  08/04/2016   IR REPLC GASTRO/COLONIC TUBE PERCUT W/FLUORO  01/14/2017   IR US  GUIDE BX ASP/DRAIN  08/04/2016   KNEE CARTILAGE SURGERY Right 1971   opened me up   LAPAROTOMY N/A 07/05/2015   Procedure: PARTIAL SIGMOID COLECTOMY AND COLOSTOMY;  Surgeon: Berg Vernetta, MD;  Location: [facility] OR;  Service: General;  Laterality: N/A;   MELANOMA EXCISION  2001   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  10/30/2015   Procedure: REMOVAL OF DISTAL STOMACH;  Surgeon: Lynwood Pina, MD;  Location: [facility] OR;  Service: General;;   REPAIR OF PERFORATED ULCER N/A 10/30/2015   Procedure: REPAIR OF BLEEDING  ULCER;  Surgeon: Lynwood Pina, MD;  Location: [facility] OR;  Service: General;  Laterality: N/A;   TUMOR EXCISION  2009   back; fatty tumor   Current Facility-Administered Medications  Medication Dose Route Frequency Provider Last Rate Last Admin   acetaminophen  (TYLENOL ) tablet 650 mg  650 mg Oral Q6H PRN Zella, Mir M, MD   650 mg at 11/12/23 2050   Or   acetaminophen  (TYLENOL ) suppository 650 mg  650 mg Rectal Q6H PRN Zella, Mir M, MD       albuterol  (PROVENTIL ) (2.5 MG/3ML) 0.083% nebulizer solution 2.5 mg  2.5 mg Nebulization Q2H PRN Zella, Mir M, MD       alum & mag hydroxide-simeth (MAALOX/MYLANTA) 200-200-20 MG/5ML suspension 15 mL  15 mL Oral Q6H PRN Zella, Mir M, MD   15 mL at 11/12/23 2053   amLODipine  (NORVASC ) tablet 5 mg  5 mg Oral Daily Zella, Mir M, MD   5 mg at 11/12/23 1332   azithromycin  (ZITHROMAX ) 500 mg in sodium chloride  0.9 % 250 mL IVPB  500 mg Intravenous Q24H Zella, Mir  M, MD 250 mL/hr at 11/12/23 1135 500 mg at 11/12/23 1135   cefTRIAXone  (ROCEPHIN ) 1 g in sodium chloride  0.9 % 100 mL IVPB  1 g Intravenous Q24H Zella, Mir M, MD 200 mL/hr at 11/12/23 1152 1 g at 11/12/23 1152   enoxaparin  (LOVENOX ) injection 40 mg  40 mg Subcutaneous Q24H Zella, Mir M, MD   40 mg at 11/12/23 1143   finasteride  (PROSCAR ) tablet 5 mg  5 mg Oral Daily Zella, Mir M, MD   5 mg at 11/12/23 1332   gabapentin  (NEURONTIN ) capsule 100 mg  100 mg Oral TID Zella Katha HERO, MD   100 mg at 11/12/23 2053   HYDROmorphone  (DILAUDID ) injection 1 mg  1 mg Intravenous Q3H PRN Zella Katha HERO, MD   1 mg at 11/13/23 0535   LORazepam  (ATIVAN ) tablet 0.5 mg  0.5 mg Oral Daily PRN Zella, Mir M, MD       ondansetron  (ZOFRAN ) tablet 4 mg  4 mg Oral Q6H PRN Zella, Mir M, MD       Or   ondansetron  (ZOFRAN ) injection 4 mg  4 mg Intravenous Q6H PRN Zella, Mir M, MD       oxyCODONE  (Oxy IR/ROXICODONE ) immediate release tablet 5 mg  5 mg Oral Q4H PRN Zella, Mir M, MD   5 mg at 11/13/23 9661   pantoprazole  (PROTONIX ) EC tablet 20 mg  20 mg Oral Daily Ikramullah, Mir M, MD   20 mg at 11/12/23 1546   zolpidem  (AMBIEN ) tablet 10 mg  10 mg Oral QHS PRN Zella Katha HERO, MD   10 mg at 11/12/23 2053   Facility-Administered Medications Ordered in Other Encounters  Medication Dose Route Frequency Provider Last Rate Last Admin   morphine  4 MG/ML injection 1-4 mg  1-4 mg Intravenous Q1H PRN [name], MD       Allergies as of 11/11/2023   (No Known Allergies)   Review of Systems:  Review of Systems  Respiratory:  Negative for shortness of breath.   Cardiovascular:  Negative for chest pain.  Gastrointestinal:  Positive for abdominal pain. Negative for nausea and vomiting.    OBJECTIVE:   Temp:  [98.2 F (36.8 C)-99.7 F (37.6 C)] 98.8 F (37.1 C) (08/17 0418) Pulse Rate:  [75-85] 85 (08/17 0418) Resp:  [16-18] 16 (08/17 0418) BP: (125-136)/(64-74) 132/74 (08/17  0418) SpO2:  [94 %-100 %] 97 % (08/17 0418) Last BM Date : 11/11/23 Physical Exam Constitutional:      General: He is not in acute distress.    Appearance: He is not ill-appearing, toxic-appearing or diaphoretic.  Cardiovascular:     Rate and Rhythm: Normal rate and regular rhythm.  Pulmonary:     Effort: No respiratory distress.     Breath sounds: Normal breath sounds.  Abdominal:     General: There is no distension.     Palpations: Abdomen is soft.     Tenderness: There is abdominal tenderness. There is no guarding.     Hernia: A hernia is present.  Neurological:     Mental Status: He is alert.     Labs: Recent Labs    11/12/23 0004 11/13/23 0502  WBC 11.3* 9.5  HGB 12.4* 9.5*  HCT 37.4* 30.3*  PLT 268 244   BMET Recent Labs    11/12/23 0004 11/13/23 0502  NA 136 133*  K 4.3 3.5  CL 100 100  CO2 21* 26  GLUCOSE 101* 86  BUN 17 14  CREATININE 0.99 0.74  CALCIUM  9.3 7.8*   LFT Recent Labs    11/13/23 0502  PROT 5.5*  ALBUMIN  2.5*  AST 30  ALT 109*  ALKPHOS 679*  BILITOT 1.1   PT/INR Recent Labs    11/12/23 0004  LABPROT 13.7  INR 1.0   Diagnostic imaging: MR ABDOMEN MRCP W WO CONTAST Result Date: 11/12/2023 CLINICAL DATA:  Acute pancreatitis. Distended gall bladder. Gallstones. EXAM: MRI ABDOMEN WITHOUT AND WITH CONTRAST (INCLUDING MRCP) TECHNIQUE: Multiplanar multisequence MR imaging of the abdomen was performed both before and after the administration of intravenous contrast. Heavily T2-weighted images of the biliary and pancreatic ducts were obtained, and three-dimensional MRCP images were rendered by post processing. CONTRAST:  6mL GADAVIST  GADOBUTROL  1 MMOL/ML IV SOLN COMPARISON:  CT 11/12/2023 FINDINGS: Lower chest:  Small bilateral pleural effusions Hepatobiliary: No intrahepatic biliary duct dilatation.  The common bile duct mildly dilated at 8 mm (image 16/series 3. There are no filling defects within common bile to suggest choledocholithiasis.  There are several dependent small gallstones within the lumen of the gallbladder (image 31/series 4. These are low signal intensity on T2 weighted imaging. Again no low signal intensity lesion identified distal bile no gallbladder wall thickening or pericholecystic Pancreas: Pancreas enhance uniformly. There is inflammation in the retroperitoneal fat surrounding the body and tail of the pancreas. This fluid extends along the anterior LEFT pararenal space. Pancreatic duct is nondilated. No organized fluid collections. No variant pancreatic ductal anatomy. Spleen: Normal spleen. Adrenals/urinary tract: Adrenal glands and kidneys are normal. Stomach/Bowel: Adrenal glands normal. Benign renal cysts of the RIGHT kidney. Vascular/Lymphatic: Abdominal aortic normal caliber. No retroperitoneal periportal lymphadenopathy. Musculoskeletal: No aggressive osseous lesion IMPRESSION: 1. Acute pancreatitis. No organized fluid collections. 2. No choledocholithiasis. 3. Multiple small gallstones and sludge within the lumen of the gallbladder. No evidence of cholecystitis. 4. Mild dilatation of the common bile duct. 5. Small bilateral pleural effusions. Electronically Signed   By: Jackquline Boxer M.D.   On: 11/12/2023 14:57   MR 3D Recon At Scanner Result Date: 11/12/2023 CLINICAL DATA:  Acute pancreatitis. Distended gall bladder. Gallstones. EXAM: MRI ABDOMEN WITHOUT AND WITH CONTRAST (INCLUDING MRCP) TECHNIQUE: Multiplanar multisequence MR imaging of the abdomen was performed both before and after the administration of intravenous contrast. Heavily T2-weighted images of the biliary and pancreatic ducts were obtained, and three-dimensional MRCP images were rendered by post processing. CONTRAST:  6mL GADAVIST  GADOBUTROL  1 MMOL/ML IV SOLN COMPARISON:  CT 11/12/2023 FINDINGS: Lower chest:  Small bilateral pleural effusions Hepatobiliary: No intrahepatic biliary duct dilatation. The common bile duct mildly dilated at 8 mm (image  16/series 3. There are no filling defects within common bile to suggest choledocholithiasis. There are several dependent small gallstones within the lumen of the gallbladder (image 31/series 4. These are low signal intensity on T2 weighted imaging. Again no low signal intensity lesion identified distal bile no gallbladder wall thickening or pericholecystic Pancreas: Pancreas enhance uniformly. There is inflammation in the retroperitoneal fat surrounding the body and tail of the pancreas. This fluid extends along the anterior LEFT pararenal space. Pancreatic duct is nondilated. No organized fluid collections. No variant pancreatic ductal anatomy. Spleen: Normal spleen. Adrenals/urinary tract: Adrenal glands and kidneys are normal. Stomach/Bowel: Adrenal glands normal. Benign renal cysts of the RIGHT kidney. Vascular/Lymphatic: Abdominal aortic normal caliber. No retroperitoneal periportal lymphadenopathy. Musculoskeletal: No aggressive osseous lesion IMPRESSION: 1. Acute pancreatitis. No organized fluid collections. 2. No choledocholithiasis. 3. Multiple small gallstones and sludge within the lumen of the gallbladder. No evidence of cholecystitis. 4. Mild dilatation of the common bile duct. 5. Small bilateral pleural effusions. Electronically Signed   By: Jackquline Boxer M.D.   On: 11/12/2023 14:57   CT CHEST ABDOMEN PELVIS W CONTRAST Result Date: 11/12/2023 CLINICAL DATA:  Sepsis EXAM: CT CHEST, ABDOMEN, AND PELVIS WITH CONTRAST TECHNIQUE: Multidetector CT imaging of the chest, abdomen and pelvis was performed following the standard protocol during bolus administration of intravenous contrast. RADIATION DOSE REDUCTION: This exam was performed according to the departmental dose-optimization program which includes automated exposure control, adjustment of the mA and/or kV according to patient size and/or use of iterative reconstruction technique. CONTRAST:  80mL OMNIPAQUE  IOHEXOL  300 MG/ML  SOLN COMPARISON:  Same  day radiograph; CT chest 10/07/2023; CT abdomen pelvis 09/30/2023 FINDINGS: CT CHEST FINDINGS Cardiovascular: No pericardial effusion. Normal caliber thoracic aorta. Coronary artery and aortic atherosclerotic  calcification. Mediastinum/Nodes: Trachea is unremarkable. Wall thickening about the distal esophagus. No pathologic adenopathy. Lungs/Pleura: Consolidative and ground-glass opacities right upper and middle lobes along the minor fissure and right lower lobe. This is compatible with multifocal pneumonia. Follow-up in 8-10 weeks is recommended to ensure resolution. No pleural effusion or pneumothorax. Musculoskeletal: No acute fracture. CT ABDOMEN PELVIS FINDINGS Hepatobiliary: Periportal edema. The liver is otherwise unremarkable. Distended gallbladder with multiple stones. Mild gallbladder wall thickening. No biliary dilation. Pancreas: Edema and stranding about the pancreas compatible with pancreatitis. No ductal dilation no organized fluid collection. No evidence of pancreatic necrosis. Spleen: Unremarkable. Adrenals/Urinary Tract: Stable adrenal glands. No urinary calculi or hydronephrosis. Scarring in the right kidney. Chronic cystic dilation of the distal ureters just proximal to the UVJ is bilaterally is unchanged. Unremarkable bladder. Stomach/Bowel: No bowel obstruction. Postoperative change about the stomach. Postoperative change about the colon in the pelvis. Wall thickening about the stomach likely reactive secondary to pancreatitis. Vascular/Lymphatic: Mild aortic atherosclerotic calcification. 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 No lymphadenopathy. Reproductive: Enlarged prostate. Other: No free intraperitoneal fluid or air. Musculoskeletal: No acute fracture. IMPRESSION: 1. Acute pancreatitis. No evidence of pancreatic necrosis or organized fluid collection. 2. Multifocal pneumonia in the right lung. Follow-up in 8-10 weeks is recommended to ensure resolution. 3. Distended  gallbladder with multiple stones and mild gallbladder wall thickening. Findings likely secondary to pancreatitis. 4. Wall thickening about the distal esophagus and stomach likely reactive secondary to pancreatitis. 5. Aortic Atherosclerosis (ICD10-I70.0). Electronically Signed   By: Norman Gatlin M.D.   On: 11/12/2023 02:11   CT HEAD WO CONTRAST ( ) Result Date: 11/12/2023 EXAM: CT HEAD WITHOUT CONTRAST 11/12/2023 12:45:51 AM TECHNIQUE: CT of the head was performed without the administration of intravenous contrast. Automated exposure control, iterative reconstruction, and/or weight based adjustment of the mA/kV was utilized to reduce the radiation dose to as low as reasonably achievable. COMPARISON: 01/14/2023 CLINICAL HISTORY: Mental status change, unknown cause. Pt BIB wife from home due to shaking. Per wife patient is more altered than usual. Pt reports it was a sudden onset 1hr PTA. FINDINGS: BRAIN AND VENTRICLES: No acute hemorrhage. Gray-white differentiation is preserved. No hydrocephalus. No extra-axial collection. No mass effect or midline shift. ORBITS: No acute abnormality. SINUSES: No acute abnormality. SOFT TISSUES AND SKULL: No acute soft tissue abnormality. No skull fracture. IMPRESSION: 1. No acute intracranial abnormality. Electronically signed by: Franky Stanford MD 11/12/2023 01:07 AM EDT RP Workstation: [id number]   DG Chest Port 1 View Result Date: 11/12/2023 CLINICAL DATA:  Questionable sepsis - evaluate for abnormality. Altered mental status. EXAM: PORTABLE CHEST 1 VIEW COMPARISON:  07/14/2023 FINDINGS: Heart is upper limits normal in size. Mediastinal contours within normal limits. Left lung clear. Patchy opacity in the right mid lung. No effusions. No acute bony abnormality. IMPRESSION: Patchy opacity in the right mid lung could reflect early infiltrate. Electronically Signed   By: Franky Crease M.D.   On: 11/12/2023 00:56   IMPRESSION: Gallstone pancreatitis  -MRCP with no  choledocholithiasis Transaminase elevation in mixed pattern, improved Abdominal pain secondary to above, improved History of perforated sigmoid diverticulitis requiring sigmoid colectomy in 2017 History of gastric ulcer requiring Billroth II in 2017 History incarcerated hiatal hernia and perforated small bowel requiring surgical repair in 2017  PLAN: - No role for ERCP given no choledocholithiasis - Trend liver enzymes - Recommend General Surgery evaluation to discuss cholecystectomy - Supportive care for pancreatitis, [facility] will sign off and be available as needed  LOS: 1 day   Estefana Keas, [facility]

## 2023-11-14 DIAGNOSIS — J189 Pneumonia, unspecified organism: Secondary | ICD-10-CM | POA: Diagnosis not present

## 2023-11-14 DIAGNOSIS — A419 Sepsis, unspecified organism: Secondary | ICD-10-CM | POA: Diagnosis not present

## 2023-11-14 DIAGNOSIS — K851 Biliary acute pancreatitis without necrosis or infection: Secondary | ICD-10-CM | POA: Diagnosis not present

## 2023-11-14 LAB — COMPREHENSIVE METABOLIC PANEL WITH GFR
ALT: 86 U/L — ABNORMAL HIGH (ref 0–44)
AST: 22 U/L (ref 15–41)
Albumin: 2.6 g/dL — ABNORMAL LOW (ref 3.5–5.0)
Alkaline Phosphatase: 621 U/L — ABNORMAL HIGH (ref 38–126)
Anion gap: 9 (ref 5–15)
BUN: 7 mg/dL — ABNORMAL LOW (ref 8–23)
CO2: 27 mmol/L (ref 22–32)
Calcium: 8.2 mg/dL — ABNORMAL LOW (ref 8.9–10.3)
Chloride: 102 mmol/L (ref 98–111)
Creatinine, Ser: 0.7 mg/dL (ref 0.61–1.24)
GFR, Estimated: >60 mL/min (ref >60)
Glucose, Bld: 100 mg/dL — ABNORMAL HIGH (ref 70–99)
Potassium: 3.2 mmol/L — ABNORMAL LOW (ref 3.5–5.1)
Sodium: 138 mmol/L (ref 135–145)
Total Bilirubin: 0.8 mg/dL (ref 0.0–1.2)
Total Protein: 6 g/dL — ABNORMAL LOW (ref 6.5–8.1)

## 2023-11-14 LAB — CBC
HCT: 30.1 % — ABNORMAL LOW (ref 39.0–52.0)
Hemoglobin: 9.6 g/dL — ABNORMAL LOW (ref 13.0–17.0)
MCH: 30.7 pg (ref 26.0–34.0)
MCHC: 31.9 g/dL (ref 30.0–36.0)
MCV: 96.2 fL (ref 80.0–100.0)
Platelets: 269 K/uL (ref 150–400)
RBC: 3.13 MIL/uL — ABNORMAL LOW (ref 4.22–5.81)
RDW: 14.6 % (ref 11.5–15.5)
WBC: 6.3 K/uL (ref 4.0–10.5)
nRBC: 0 % (ref 0.0–0.2)

## 2023-11-14 LAB — CULTURE, BLOOD (ROUTINE X 2): Special Requests: ADEQUATE

## 2023-11-14 LAB — LIPASE, BLOOD: Lipase: 186 U/L — ABNORMAL HIGH (ref 11–51)

## 2023-11-14 MED ORDER — POTASSIUM CHLORIDE CRYS ER 20 MEQ PO TBCR
40.0000 meq | EXTENDED_RELEASE_TABLET | Freq: Once | ORAL | Status: AC
  Administered 2023-11-14: 40 meq via ORAL
  Filled 2023-11-14: qty 4

## 2023-11-14 NOTE — Progress Notes (Signed)
 Mobility Specialist - Progress Note   11/14/23 0841  Mobility  Activity Ambulated with assistance  Level of Assistance Contact guard assist, steadying assist  Assistive Device Other (Comment) (HHA)  Distance Ambulated (ft) 300 ft  Range of Motion/Exercises Active  Activity Response Tolerated well  Mobility Referral Yes  Mobility visit 1 Mobility  Mobility Specialist Start Time (ACUTE ONLY) 0830  Mobility Specialist Stop Time (ACUTE ONLY) 0841  Mobility Specialist Time Calculation (min) (ACUTE ONLY) 11 min   Pt was found in bed requesting to get up. No complaints during session. Returned to recliner chair with all needs met. Call bell in reach and NT notified.   Erminio Leos,  Mobility Specialist Can be reached via Secure Chat

## 2023-11-14 NOTE — Progress Notes (Signed)
  Progress Note   Patient: Miguel [name] FMW:[id number] DOB: 26-Mar-1951 DOA: 11/11/2023     2 DOS: the patient was seen and examined on 11/14/2023   Brief hospital course: 73 year old man complicated abdominal history who presented with chills, shakes and feeling of fever.  Admitted for acute pancreatitis as well as pneumonia.  Consultants GI General surgery  Procedures/Events 8/15 admit for pneumonia, acute pancreatitis.  Assessment and Plan: Sepsis Multifocal pneumonia right lung Acute hypoxia without respiratory failure Tachycardia, fever, leukocytosis, source is right-sided community-acquired pneumonia.  Currently patient is hemodynamically stable, lactate is normal. 88% per ED RN note but no documentation of distress. Currently on room air. Recommend follow-up imaging in 8 to 10 weeks to ensure resolution. Appears resolved   Gallstone pancreatitis Gallbladder distended with multiple stones and mild gallbladder wall thickening.  No evidence of acute cholecystitis.  MRCP and CT abdomen pelvis no pancreatitis complicating features.  Multiple small gallstones and sludge within the lumen of the gallbladder.  Mild dilatation of the common bile duct. LFTs trending down.  Surgery had some concern for cholecystitis, HIDA scan ordered.   Acute normocytic anemia Likely dilutional  Stable today.  No further evaluation suggested.   Hypocalemia Replete.  Improving.   Chronic back pain   GERD-Protonix    Aortic atherosclerosis       Subjective:  Feels ok today, but has some abdominal pain  Physical Exam: Vitals:   11/13/23 1317 11/13/23 2151 11/14/23 0537 11/14/23 1329  BP: 130/69 128/71 138/69 133/73  Pulse: 85 75 79 79  Resp: 16 18 19 16   Temp: 99.7 F (37.6 C) 98.3 F (36.8 C) 98.6 F (37 C) 99.3 F (37.4 C)  TempSrc: Oral Oral Oral Oral  SpO2: 95% 95% 94% 96%  Weight:      Height:       Physical Exam Vitals reviewed.  Constitutional:      General: He is  not in acute distress.    Appearance: He is not ill-appearing or toxic-appearing.  Cardiovascular:     Rate and Rhythm: Normal rate and regular rhythm.     Heart sounds: No murmur heard. Pulmonary:     Effort: Pulmonary effort is normal. No respiratory distress.     Breath sounds: No wheezing, rhonchi or rales.  Neurological:     Mental Status: He is alert.  Psychiatric:        Mood and Affect: Mood normal.        Behavior: Behavior normal.     Data Reviewed: K+ 3,2 AP down to 621 Lipase down to 186 ALT down to 86 Hgb stable 9.6   Family Communication: wife at bedside  Disposition: Status is: Inpatient Remains inpatient appropriate because: acute cholecystitis     Time spent: 20 minutes  Author: Toribio Door, MD 11/14/2023 5:17 PM  For on call review [email or web address].

## 2023-11-14 NOTE — Progress Notes (Signed)
 Pt assessment unchanged, family at bedside visiting with pt. Pt tolerating clear liquid diet without discomfort. Pt aware of planned procedure for tomorrow and preparation. Nursing plan of care unchanged. SRP, RN

## 2023-11-14 NOTE — TOC Initial Note (Signed)
 Transition of Care [facility]) - Initial/Assessment Note    Patient Details  Name: Miguel [name] MRN: [id number] Date of Birth: 1950-10-23  Transition of Care [facility]) CM/SW Contact:    Bascom Service, RN Phone Number: 11/14/2023, 4:07 PM  Clinical Narrative: d/c plan home.                  Expected Discharge Plan: Home/Self Care Barriers to Discharge: Continued Medical Work up   Patient Goals and CMS Choice Patient states their goals for this hospitalization and ongoing recovery are:: Home CMS Medicare.gov Compare Post Acute Care list provided to:: Patient Choice offered to / list presented to : Patient [facility] ownership interest in [facility].provided to:: Patient    Expected Discharge Plan and Services                                              Prior Living Arrangements/Services                       Activities of Daily Living   ADL Screening (condition at time of admission) Independently performs ADLs?: No Does the patient have a NEW difficulty with bathing/dressing/toileting/self-feeding that is expected to last >3 days?: Yes (Initiates electronic notice to provider for possible OT consult) Does the patient have a NEW difficulty with getting in/out of bed, walking, or climbing stairs that is expected to last >3 days?: Yes (Initiates electronic notice to provider for possible PT consult) Does the patient have a NEW difficulty with communication that is expected to last >3 days?: No Is the patient deaf or have difficulty hearing?: No Does the patient have difficulty seeing, even when wearing glasses/contacts?: No Does the patient have difficulty concentrating, remembering, or making decisions?: Yes  Permission Sought/Granted                  Emotional Assessment              Admission diagnosis:  Sepsis due to pneumonia (HCC) [J18.9, A41.9] Multifocal pneumonia [J18.9] Acute pancreatitis, unspecified complication status,  unspecified pancreatitis type [K85.90] Patient Active Problem List   Diagnosis Date Noted   Multifocal pneumonia 11/13/2023   Hypoxia 11/13/2023   Acute gallstone pancreatitis 11/13/2023   Aortic atherosclerosis (HCC) 11/13/2023   Sepsis due to pneumonia (HCC) 11/12/2023   Effusion, right knee    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   Bilateral inguinal hernia 09/28/2018   Incisional hernia 03/17/2018   Trigger thumb, left thumb 01/19/2018   Trigger finger, left index finger 07/18/2017   Trigger finger, right ring finger 01/31/2017   Trigger finger of left thumb 01/03/2017   Trigger finger of right thumb 01/03/2017   Trigger index finger of left hand 01/03/2017   Trigger index finger of right hand 01/03/2017   Malnutrition of moderate degree 07/19/2016   Intra-abdominal abscess (HCC) 07/16/2016   Cellulitis 07/15/2016   S/P colostomy takedown 06/28/2016   Pain in thoracic spine 06/09/2016   Mid back pain 06/09/2016   Postoperative fever 11/19/2015   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   Severe protein-calorie malnutrition (HCC) 11/17/2015   Sepsis (HCC) 11/14/2015   Hiccups 11/14/2015   AKI (acute kidney injury) (HCC) 11/14/2015   Fever    Leg swelling    Left shoulder pain    Muscle spasm of left shoulder  GI bleed 10/27/2015   Acute blood loss anemia 10/27/2015   Syncope 10/27/2015   Hyperglycemia 10/27/2015   Hypotension 10/27/2015   Neck pain 10/27/2015   Hematemesis 10/27/2015   Hematochezia 10/27/2015   Diverticulitis of colon with perforation 07/05/2015   PCP:  Leonel Cole, MD Pharmacy:   [facility] [phone] - [place], [place] - [address] AT [facility] [address] [place] [address] Phone: [phone] Fax: [phone]     Social Drivers of Health (SDOH) Social History: SDOH Screenings   Food Insecurity: No Food Insecurity (11/12/2023)  Housing: Low Risk  (11/12/2023)  Transportation  Needs: No Transportation Needs (11/12/2023)  Utilities: Not At Risk (11/12/2023)  Social Connections: Socially Integrated (11/12/2023)  Tobacco Use: Medium Risk (11/11/2023)   SDOH Interventions:     Readmission Risk Interventions     No data to display

## 2023-11-14 NOTE — Progress Notes (Signed)
 Progress Note     Subjective: Pt reports increased RUQ and R back pain this morning. Denies nausea or vomiting. Has been taking in CLD. Wife at bedside. Long discussion regarding concern for potential cholecystitis with increased pain today and what options might be regarding this.   Objective: Vital signs in last 24 hours: Temp:  [98.3 F (36.8 C)-99.7 F (37.6 C)] 98.6 F (37 C) (08/18 0537) Pulse Rate:  [75-85] 79 (08/18 0537) Resp:  [16-19] 19 (08/18 0537) BP: (128-138)/(69-71) 138/69 (08/18 0537) SpO2:  [94 %-95 %] 94 % (08/18 0537) Last BM Date : 11/14/23  Intake/Output from previous day: 08/17 0701 - 08/18 0700 In: 1160 [P.O.:1160] Out: 2200 [Urine:2200] Intake/Output this shift: No intake/output data recorded.  PE: General: pleasant, WD, WN male who is laying in bed in NAD HEENT: sclera anicteric  Heart: regular, rate, and rhythm Lungs: Respiratory effort nonlabored Abd: soft, ttp in RUQ with positive murphy sign, ND, ventral abdominal hernias that are soft  Psych: A&Ox3 with an appropriate affect.    Lab Results:  Recent Labs    11/13/23 0502 11/14/23 0435  WBC 9.5 6.3  HGB 9.5* 9.6*  HCT 30.3* 30.1*  PLT 244 269   BMET Recent Labs    11/13/23 0502 11/14/23 0435  NA 133* 138  K 3.5 3.2*  CL 100 102  CO2 26 27  GLUCOSE 86 100*  BUN 14 7*  CREATININE 0.74 0.70  CALCIUM  7.8* 8.2*   PT/INR Recent Labs    11/12/23 0004  LABPROT 13.7  INR 1.0   CMP     Component Value Date/Time   NA 138 11/14/2023 0435   K 3.2 (L) 11/14/2023 0435   CL 102 11/14/2023 0435   CO2 27 11/14/2023 0435   GLUCOSE 100 (H) 11/14/2023 0435   BUN 7 (L) 11/14/2023 0435   CREATININE 0.70 11/14/2023 0435   CALCIUM  8.2 (L) 11/14/2023 0435   PROT 6.0 (L) 11/14/2023 0435   ALBUMIN  2.6 (L) 11/14/2023 0435   AST 22 11/14/2023 0435   ALT 86 (H) 11/14/2023 0435   ALKPHOS 621 (H) 11/14/2023 0435   BILITOT 0.8 11/14/2023 0435   GFRNONAA >60 11/14/2023 0435   GFRAA >60  09/25/2018 1130   Lipase     Component Value Date/Time   LIPASE 186 (H) 11/14/2023 0435       Studies/Results: No results found.  Anti-infectives: Anti-infectives (From admission, onward)    Start     Dose/Rate Route Frequency Ordered Stop   11/12/23 1200  cefTRIAXone  (ROCEPHIN ) 1 g in sodium chloride  0.9 % 100 mL IVPB        1 g 200 mL/hr over 30 Minutes Intravenous Every 24 hours 11/12/23 0916     11/12/23 1100  azithromycin  (ZITHROMAX ) 500 mg in sodium chloride  0.9 % 250 mL IVPB        500 mg 250 mL/hr over 60 Minutes Intravenous Every 24 hours 11/12/23 0916     11/12/23 0015  ceFEPIme  (MAXIPIME ) 2 g in sodium chloride  0.9 % 100 mL IVPB        2 g 200 mL/hr over 30 Minutes Intravenous  Once 11/12/23 0004 11/12/23 0325   11/12/23 0015  metroNIDAZOLE  (FLAGYL ) IVPB 500 mg        500 mg 100 mL/hr over 60 Minutes Intravenous  Once 11/12/23 0004 11/12/23 0324   11/12/23 0015  vancomycin  (VANCOCIN ) IVPB 1000 mg/200 mL premix        1,000 mg 200 mL/hr over  60 Minutes Intravenous  Once 11/12/23 0004 11/12/23 0325        Assessment/Plan Gallstone pancreatitis Possible cholecystitis  Chronic abdominal pain from multiple prior surgeries  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  - CT AP 8/16 with acute pancreatitis without necrosis or organized collections, multifocal PNA in right lungh, distended gallbladder with multiple stones and mild wall thickening, wall thickening around esophagus and stomach, aortic atherosclerosis  - MRCP 8/16 without choledocholithiasis, sludge and small stones in the gallbladder without cholecystitis, mild dilatation of CBD, acute pancreatitis without organized collections, small bilateral pleural effusions - lipase 1124 on admit, 186 today; Tbili was 4.3 on admit and now 0.8 - Alk Phos/AST/ALT all trending down - no leukocytosis but patient clinically with some RUQ TTP on exam today  - 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 Anticipate that minimally invasive surgical intervention may not be possible given extensive surgical history and recurrent ventral hernias. With increased RUQ TTP on exam today I do have some concern for acute cholecystitis - will discuss with attending MD if any further imaging is warranted. If acute cholecystitis present would recommend percutaneous cholecystostomy placement.  FEN: CLD VTE: LMWH ID: rocephin /azithromycin   - per TRH -  Multifocal PNA of R lung Acute normocytic anemia  Chronic back pain GERD Aortic atherosclerosis  HTN HLD BPH   LOS: 2 days   I reviewed Consultant GI notes, hospitalist notes, last 24 h vitals and pain scores, last 48 h intake and output, last 24 h labs and trends, and last 24 h imaging results.  This care required moderate level of medical decision making.    Burnard JONELLE Louder, [facility] 11/14/2023, 12:10 PM Please see Amion for pager number during day hours 7:00am-4:30pm

## 2023-11-15 ENCOUNTER — Inpatient Hospital Stay (HOSPITAL_COMMUNITY)

## 2023-11-15 DIAGNOSIS — K851 Biliary acute pancreatitis without necrosis or infection: Secondary | ICD-10-CM | POA: Diagnosis not present

## 2023-11-15 DIAGNOSIS — A419 Sepsis, unspecified organism: Secondary | ICD-10-CM | POA: Diagnosis not present

## 2023-11-15 DIAGNOSIS — J189 Pneumonia, unspecified organism: Secondary | ICD-10-CM | POA: Diagnosis not present

## 2023-11-15 LAB — COMPREHENSIVE METABOLIC PANEL WITH GFR
ALT: 69 U/L — ABNORMAL HIGH (ref 0–44)
AST: 18 U/L (ref 15–41)
Albumin: 2.7 g/dL — ABNORMAL LOW (ref 3.5–5.0)
Alkaline Phosphatase: 572 U/L — ABNORMAL HIGH (ref 38–126)
Anion gap: 10 (ref 5–15)
BUN: 5 mg/dL — ABNORMAL LOW (ref 8–23)
CO2: 27 mmol/L (ref 22–32)
Calcium: 8.7 mg/dL — ABNORMAL LOW (ref 8.9–10.3)
Chloride: 103 mmol/L (ref 98–111)
Creatinine, Ser: 0.81 mg/dL (ref 0.61–1.24)
GFR, Estimated: >60 mL/min (ref >60)
Glucose, Bld: 100 mg/dL — ABNORMAL HIGH (ref 70–99)
Potassium: 3.9 mmol/L (ref 3.5–5.1)
Sodium: 140 mmol/L (ref 135–145)
Total Bilirubin: 0.7 mg/dL (ref 0.0–1.2)
Total Protein: 5.9 g/dL — ABNORMAL LOW (ref 6.5–8.1)

## 2023-11-15 LAB — LIPASE, BLOOD: Lipase: 126 U/L — ABNORMAL HIGH (ref 11–51)

## 2023-11-15 MED ORDER — AZITHROMYCIN 500 MG PO TABS: 500.0000 mg | ORAL_TABLET | Freq: Every day | ORAL | 0 refills | Status: AC

## 2023-11-15 MED ORDER — TECHNETIUM TC 99M MEBROFENIN IV KIT
5.3900 | PACK | Freq: Once | INTRAVENOUS | Status: AC
  Administered 2023-11-15: 5.39 via INTRAVENOUS

## 2023-11-15 MED ORDER — HYDROXYZINE HCL 10 MG PO TABS
10.0000 mg | ORAL_TABLET | Freq: Once | ORAL | Status: AC | PRN
  Administered 2023-11-15: 10 mg via ORAL
  Filled 2023-11-15: qty 1

## 2023-11-15 MED ORDER — CEFUROXIME AXETIL 500 MG PO TABS: 500.0000 mg | ORAL_TABLET | Freq: Two times a day (BID) | ORAL | 0 refills | Status: AC

## 2023-11-15 NOTE — Discharge Summary (Addendum)
 Physician Discharge Summary   Patient: Miguel [name] MRN: [id number] DOB: 12-Oct-1950  Admit date:     11/11/2023  Discharge date: 11/15/23  Discharge Physician: Toribio Door   PCP: Leonel Cole, MD   Recommendations at discharge:   Multifocal pneumonia right lung Recommend follow-up imaging in 8 to 10 weeks to ensure resolution.   Gallstone pancreatitis Consideration was given to cholecystectomy given gallstone pancreatitis, HIDA scan however was reassuring and given complex surgical history, general surgery recommended discharge home, follow-up with PCP in 1 week, and if has ongoing gallbladder issues consider cholecystostomy and discussion with IR for spyglass procedure.  Discharge Diagnoses: Principal Problem:   Severe sepsis due to pneumonia [facility]) Active Problems:   Multifocal pneumonia   Hypoxia   Acute gallstone pancreatitis   Aortic atherosclerosis (HCC)  Resolved Problems:   * No resolved hospital problems. *  Hospital Course: 73 year old man complicated abdominal history who presented with chills, shakes and feeling of fever.  Admitted for acute pancreatitis as well as pneumonia.  Pneumonia quickly resolved clinically, will complete a short course of antibiotics.  Acute pancreatitis also improved fairly rapidly.  Seen by gastroenterology and general surgery.  Consideration was given to cholecystectomy given gallstone pancreatitis, HIDA scan however was reassuring and given complex surgical history, general surgery recommended discharge home, follow-up with PCP in 1 week, and if has ongoing gallbladder issues consider cholecystostomy and discussion with IR for spyglass procedure.  Consultants GI General surgery  Procedures/Events 8/15 admit for pneumonia, acute pancreatitis.  Sepsis Multifocal pneumonia right lung Acute hypoxia without respiratory failure Tachycardia, fever, leukocytosis, source is right-sided community-acquired pneumonia.  Currently patient is  hemodynamically stable, lactate is normal. 88% per ED RN note but no documentation of distress. Currently on room air. Recommend follow-up imaging in 8 to 10 weeks to ensure resolution. Appears resolved   Gallstone pancreatitis Gallbladder distended with multiple stones and mild gallbladder wall thickening.  No evidence of acute cholecystitis.  MRCP and CT abdomen pelvis no pancreatitis complicating features.  Multiple small gallstones and sludge within the lumen of the gallbladder.  Mild dilatation of the common bile duct. LFTs trending down.  Consideration was given to cholecystectomy given gallstone pancreatitis, HIDA scan however was reassuring and given complex surgical history, general surgery recommended discharge home, follow-up with PCP in 1 week, and if has ongoing gallbladder issues consider cholecystostomy and discussion with IR for spyglass procedure.   Acute normocytic anemia Likely dilutional  Stable    Hypocalemia Replete.  Improving.  Expect spontaneous resolution.   Chronic back pain   GERD-Protonix    Aortic atherosclerosis  Disposition: Home Diet recommendation:  Soft diet, bland DISCHARGE MEDICATION: Allergies as of 11/15/2023   No Known Allergies      Medication List     STOP taking these medications    diazepam  5 MG tablet Commonly known as: VALIUM    famotidine  20 MG tablet Commonly known as: PEPCID    HYDROcodone -acetaminophen  5-325 MG tablet Commonly known as: NORCO/VICODIN   tiZANidine 2 MG tablet Commonly known as: ZANAFLEX       TAKE these medications    acetaminophen  500 MG tablet Commonly known as: TYLENOL  Take 1 tablet (500 mg total) by mouth every 8 (eight) hours as needed for mild pain (for pain). What changed:  how much to take when to take this reasons to take this   Amitiza 24 MCG capsule Generic drug: lubiprostone Take 24 mcg by mouth 2 (two) times daily with a meal.   amLODipine  5  MG tablet Commonly known as:  NORVASC  Take 5 mg by mouth daily.   azithromycin  500 MG tablet Commonly known as: Zithromax  Take 1 tablet (500 mg total) by mouth daily for 1 day. Take 8/20 in AM   cefUROXime  500 MG tablet Commonly known as: CEFTIN  Take 1 tablet (500 mg total) by mouth 2 (two) times daily for 3 days.   cyanocobalamin 1000 MCG tablet Commonly known as: VITAMIN B12 Take 1,000 mcg by mouth daily.   diclofenac sodium 1 % Gel Commonly known as: VOLTAREN Apply 2 g topically 3 (three) times daily as needed (joint pain).   finasteride  5 MG tablet Commonly known as: PROSCAR  Take 5 mg by mouth daily.   gabapentin  100 MG capsule Commonly known as: NEURONTIN  Take 1 capsule by mouth 3 (three) times daily.   LORazepam  1 MG tablet Commonly known as: ATIVAN  Take 0.5 mg by mouth daily as needed for anxiety.   meloxicam 15 MG tablet Commonly known as: MOBIC Take 15 mg by mouth daily as needed.   methocarbamol  500 MG tablet Commonly known as: ROBAXIN  Take 250-500 mg by mouth every 8 (eight) hours as needed for muscle spasms.   multivitamin with minerals Tabs tablet Take 1 tablet by mouth daily.   olmesartan 20 MG tablet Commonly known as: BENICAR Take 20 mg by mouth daily.   oxyCODONE  5 MG immediate release tablet Commonly known as: Oxy IR/ROXICODONE  Take 1 tablet (5 mg total) by mouth every 6 (six) hours as needed for moderate pain, severe pain or breakthrough pain.   pantoprazole  20 MG tablet Commonly known as: PROTONIX  Take 20 mg by mouth daily.   polyethylene glycol 17 g packet Commonly known as: MIRALAX / GLYCOLAX Take 17 g by mouth daily as needed for mild constipation.   PROBIOTIC DAILY PO Take 1 capsule by mouth daily.   senna 8.6 MG tablet Commonly known as: SENOKOT Take 2 tablets by mouth at bedtime.   tamsulosin  0.4 MG Caps capsule Commonly known as: FLOMAX  Take 1 capsule (0.4 mg total) by mouth daily.   traMADol  50 MG tablet Commonly known as: ULTRAM  Take 50 mg by  mouth every 6 (six) hours as needed for moderate pain.   zolpidem  10 MG tablet Commonly known as: AMBIEN  Take 10 mg by mouth at bedtime.        Follow-up Information     Leonel Cole, MD Follow up.   Specialty: Family Medicine Why: As needed Contact information: [address] [place] [address] [phone]                Feels ok  Discharge Exam: Filed Weights   11/12/23 0011 11/12/23 0621  Weight: 62.6 kg 61.6 kg   Physical Exam Vitals reviewed.  Constitutional:      General: He is not in acute distress.    Appearance: He is not ill-appearing or toxic-appearing.  Cardiovascular:     Rate and Rhythm: Normal rate and regular rhythm.     Heart sounds: No murmur heard. Pulmonary:     Effort: Pulmonary effort is normal. No respiratory distress.     Breath sounds: No wheezing, rhonchi or rales.  Neurological:     Mental Status: He is alert.  Psychiatric:        Mood and Affect: Mood normal.        Behavior: Behavior normal.      Condition at discharge: good  The results of significant diagnostics from this hospitalization (including imaging, microbiology,  ancillary and laboratory) are listed below for reference.   Imaging Studies: NM Hepatobiliary Liver Func Result Date: 11/15/2023 CLINICAL DATA:  RIGHT upper quadrant pain. Biliary disease suspected EXAM: NUCLEAR MEDICINE HEPATOBILIARY IMAGING TECHNIQUE: Sequential images of the abdomen were obtained out to 60 minutes following intravenous administration of radiopharmaceutical. RADIOPHARMACEUTICALS:  5.4 mCi Tc-68m  Choletec  IV COMPARISON:  None Available. FINDINGS: Prompt uptake and biliary excretion of activity by the liver is seen. Gallbladder activity is visualized, consistent with patency of cystic duct. Biliary activity passes into small bowel, consistent with patent common bile duct. IMPRESSION: Patent cystic duct and common bile duct. No evidence of acute cholecystitis. Electronically  Signed   By: Jackquline Boxer M.D.   On: 11/15/2023 09:38   MR ABDOMEN MRCP W WO CONTAST Result Date: 11/12/2023 CLINICAL DATA:  Acute pancreatitis. Distended gall bladder. Gallstones. EXAM: MRI ABDOMEN WITHOUT AND WITH CONTRAST (INCLUDING MRCP) TECHNIQUE: Multiplanar multisequence MR imaging of the abdomen was performed both before and after the administration of intravenous contrast. Heavily T2-weighted images of the biliary and pancreatic ducts were obtained, and three-dimensional MRCP images were rendered by post processing. CONTRAST:  6mL GADAVIST  GADOBUTROL  1 MMOL/ML IV SOLN COMPARISON:  CT 11/12/2023 FINDINGS: Lower chest:  Small bilateral pleural effusions Hepatobiliary: No intrahepatic biliary duct dilatation. The common bile duct mildly dilated at 8 mm (image 16/series 3. There are no filling defects within common bile to suggest choledocholithiasis. There are several dependent small gallstones within the lumen of the gallbladder (image 31/series 4. These are low signal intensity on T2 weighted imaging. Again no low signal intensity lesion identified distal bile no gallbladder wall thickening or pericholecystic Pancreas: Pancreas enhance uniformly. There is inflammation in the retroperitoneal fat surrounding the body and tail of the pancreas. This fluid extends along the anterior LEFT pararenal space. Pancreatic duct is nondilated. No organized fluid collections. No variant pancreatic ductal anatomy. Spleen: Normal spleen. Adrenals/urinary tract: Adrenal glands and kidneys are normal. Stomach/Bowel: Adrenal glands normal. Benign renal cysts of the RIGHT kidney. Vascular/Lymphatic: Abdominal aortic normal caliber. No retroperitoneal periportal lymphadenopathy. Musculoskeletal: No aggressive osseous lesion IMPRESSION: 1. Acute pancreatitis. No organized fluid collections. 2. No choledocholithiasis. 3. Multiple small gallstones and sludge within the lumen of the gallbladder. No evidence of cholecystitis.  4. Mild dilatation of the common bile duct. 5. Small bilateral pleural effusions. Electronically Signed   By: Jackquline Boxer M.D.   On: 11/12/2023 14:57   MR 3D Recon At Scanner Result Date: 11/12/2023 CLINICAL DATA:  Acute pancreatitis. Distended gall bladder. Gallstones. EXAM: MRI ABDOMEN WITHOUT AND WITH CONTRAST (INCLUDING MRCP) TECHNIQUE: Multiplanar multisequence MR imaging of the abdomen was performed both before and after the administration of intravenous contrast. Heavily T2-weighted images of the biliary and pancreatic ducts were obtained, and three-dimensional MRCP images were rendered by post processing. CONTRAST:  6mL GADAVIST  GADOBUTROL  1 MMOL/ML IV SOLN COMPARISON:  CT 11/12/2023 FINDINGS: Lower chest:  Small bilateral pleural effusions Hepatobiliary: No intrahepatic biliary duct dilatation. The common bile duct mildly dilated at 8 mm (image 16/series 3. There are no filling defects within common bile to suggest choledocholithiasis. There are several dependent small gallstones within the lumen of the gallbladder (image 31/series 4. These are low signal intensity on T2 weighted imaging. Again no low signal intensity lesion identified distal bile no gallbladder wall thickening or pericholecystic Pancreas: Pancreas enhance uniformly. There is inflammation in the retroperitoneal fat surrounding the body and tail of the pancreas. This fluid extends along the anterior LEFT pararenal space. Pancreatic  duct is nondilated. No organized fluid collections. No variant pancreatic ductal anatomy. Spleen: Normal spleen. Adrenals/urinary tract: Adrenal glands and kidneys are normal. Stomach/Bowel: Adrenal glands normal. Benign renal cysts of the RIGHT kidney. Vascular/Lymphatic: Abdominal aortic normal caliber. No retroperitoneal periportal lymphadenopathy. Musculoskeletal: No aggressive osseous lesion IMPRESSION: 1. Acute pancreatitis. No organized fluid collections. 2. No choledocholithiasis. 3. Multiple small  gallstones and sludge within the lumen of the gallbladder. No evidence of cholecystitis. 4. Mild dilatation of the common bile duct. 5. Small bilateral pleural effusions. Electronically Signed   By: Jackquline Boxer M.D.   On: 11/12/2023 14:57   CT CHEST ABDOMEN PELVIS W CONTRAST Result Date: 11/12/2023 CLINICAL DATA:  Sepsis EXAM: CT CHEST, ABDOMEN, AND PELVIS WITH CONTRAST TECHNIQUE: Multidetector CT imaging of the chest, abdomen and pelvis was performed following the standard protocol during bolus administration of intravenous contrast. RADIATION DOSE REDUCTION: This exam was performed according to the departmental dose-optimization program which includes automated exposure control, adjustment of the mA and/or kV according to patient size and/or use of iterative reconstruction technique. CONTRAST:  80mL OMNIPAQUE  IOHEXOL  300 MG/ML  SOLN COMPARISON:  Same day radiograph; CT chest 10/07/2023; CT abdomen pelvis 09/30/2023 FINDINGS: CT CHEST FINDINGS Cardiovascular: No pericardial effusion. Normal caliber thoracic aorta. Coronary artery and aortic atherosclerotic calcification. Mediastinum/Nodes: Trachea is unremarkable. Wall thickening about the distal esophagus. No pathologic adenopathy. Lungs/Pleura: Consolidative and ground-glass opacities right upper and middle lobes along the minor fissure and right lower lobe. This is compatible with multifocal pneumonia. Follow-up in 8-10 weeks is recommended to ensure resolution. No pleural effusion or pneumothorax. Musculoskeletal: No acute fracture. CT ABDOMEN PELVIS FINDINGS Hepatobiliary: Periportal edema. The liver is otherwise unremarkable. Distended gallbladder with multiple stones. Mild gallbladder wall thickening. No biliary dilation. Pancreas: Edema and stranding about the pancreas compatible with pancreatitis. No ductal dilation no organized fluid collection. No evidence of pancreatic necrosis. Spleen: Unremarkable. Adrenals/Urinary Tract: Stable adrenal  glands. No urinary calculi or hydronephrosis. Scarring in the right kidney. Chronic cystic dilation of the distal ureters just proximal to the UVJ is bilaterally is unchanged. Unremarkable bladder. Stomach/Bowel: No bowel obstruction. Postoperative change about the stomach. Postoperative change about the colon in the pelvis. Wall thickening about the stomach likely reactive secondary to pancreatitis. Vascular/Lymphatic: Mild aortic atherosclerotic calcification. 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 No lymphadenopathy. Reproductive: Enlarged prostate. Other: No free intraperitoneal fluid or air. Musculoskeletal: No acute fracture. IMPRESSION: 1. Acute pancreatitis. No evidence of pancreatic necrosis or organized fluid collection. 2. Multifocal pneumonia in the right lung. Follow-up in 8-10 weeks is recommended to ensure resolution. 3. Distended gallbladder with multiple stones and mild gallbladder wall thickening. Findings likely secondary to pancreatitis. 4. Wall thickening about the distal esophagus and stomach likely reactive secondary to pancreatitis. 5. Aortic Atherosclerosis (ICD10-I70.0). Electronically Signed   By: Norman Gatlin M.D.   On: 11/12/2023 02:11   CT HEAD WO CONTRAST ( ) Result Date: 11/12/2023 EXAM: CT HEAD WITHOUT CONTRAST 11/12/2023 12:45:51 AM TECHNIQUE: CT of the head was performed without the administration of intravenous contrast. Automated exposure control, iterative reconstruction, and/or weight based adjustment of the mA/kV was utilized to reduce the radiation dose to as low as reasonably achievable. COMPARISON: 01/14/2023 CLINICAL HISTORY: Mental status change, unknown cause. Pt BIB wife from home due to shaking. Per wife patient is more altered than usual. Pt reports it was a sudden onset 1hr PTA. FINDINGS: BRAIN AND VENTRICLES: No acute hemorrhage. Gray-white differentiation is preserved. No hydrocephalus. No extra-axial collection. No mass effect or midline  shift. ORBITS: No acute abnormality. SINUSES: No acute abnormality. SOFT TISSUES AND SKULL: No acute soft tissue abnormality. No skull fracture. IMPRESSION: 1. No acute intracranial abnormality. Electronically signed by: Franky Stanford MD 11/12/2023 01:07 AM EDT RP Workstation: [id number]   DG Chest Port 1 View Result Date: 11/12/2023 CLINICAL DATA:  Questionable sepsis - evaluate for abnormality. Altered mental status. EXAM: PORTABLE CHEST 1 VIEW COMPARISON:  07/14/2023 FINDINGS: Heart is upper limits normal in size. Mediastinal contours within normal limits. Left lung clear. Patchy opacity in the right mid lung. No effusions. No acute bony abnormality. IMPRESSION: Patchy opacity in the right mid lung could reflect early infiltrate. Electronically Signed   By: Franky Crease M.D.   On: 11/12/2023 00:56    Microbiology: Results for orders placed or performed during the hospital encounter of 11/11/23  Blood Culture (routine x 2)     Status: Abnormal   Collection Time: 11/12/23 12:04 AM   Specimen: BLOOD  Result Value Ref Range Status   Specimen Description   Final    BLOOD LEFT ANTECUBITAL Performed at [facility], [address] [place], [place] [address]    Special Requests   Final    BOTTLES DRAWN AEROBIC AND ANAEROBIC Blood Culture adequate volume Performed at [facility], [address] [place], [place] [address]    Culture  Setup Time   Final    GRAM POSITIVE RODS IN BOTH AEROBIC AND ANAEROBIC BOTTLES CRITICAL RESULT CALLED TO, READ BACK BY AND VERIFIED WITH: PHARMD M LILLISTON 11/12/2023 @ 2335 BY AB    Culture (A)  Final    BACILLUS SPECIES Standardized susceptibility testing for this organism is not available. Performed at [facility], [address] [place], [place] [address]    Report Status 11/14/2023 FINAL  Final  Blood Culture (routine x 2)     Status: None (Preliminary result)   Collection Time: 11/12/23 12:09 AM    Specimen: BLOOD RIGHT HAND  Result Value Ref Range Status   Specimen Description   Final    BLOOD RIGHT HAND Performed at [facility], [address], [place], [place] [address]    Special Requests   Final    BOTTLES DRAWN AEROBIC AND ANAEROBIC Blood Culture results may not be optimal due to an inadequate volume of blood received in culture bottles Performed at [facility], [address], [place], [place] [address]    Culture   Final    NO GROWTH 3 DAYS Performed at [facility], [address] [place], [place] [address]    Report Status PENDING  Incomplete  Resp panel by RT-PCR (RSV, Flu A&B, Covid) Anterior Nasal Swab     Status: None   Collection Time: 11/12/23 12:24 AM   Specimen: Anterior Nasal Swab  Result Value Ref Range Status   SARS Coronavirus 2 by RT PCR NEGATIVE NEGATIVE Final    Comment: (NOTE) SARS-CoV-2 target nucleic acids are NOT DETECTED.  The SARS-CoV-2 RNA is generally detectable in upper respiratory specimens during the acute phase of infection. The lowest concentration of SARS-CoV-2 viral copies this assay can detect is 138 copies/mL. A negative result does not preclude SARS-Cov-2 infection and should not be used as the sole basis for treatment or other patient management decisions. A negative result may occur with  improper specimen collection/handling, submission of specimen other than nasopharyngeal swab, presence of viral mutation(s) within the areas targeted by this assay, and inadequate number of viral copies(<138  copies/mL). A negative result must be combined with clinical observations, patient history, and epidemiological information. The expected result is Negative.  Fact Sheet for Patients:  BloggerCourse.com  Fact Sheet for Healthcare Providers:  SeriousBroker.it  This test is no t yet approved or cleared by the [place]  FDA and  has been authorized for  detection and/or diagnosis of SARS-CoV-2 by FDA under an Emergency Use Authorization (EUA). This EUA will remain  in effect (meaning this test can be used) for the duration of the COVID-19 declaration under Section 564(b)(1) of the Act, 21 U.S.C.section 360bbb-3(b)(1), unless the authorization is terminated  or revoked sooner.       Influenza A by PCR NEGATIVE NEGATIVE Final   Influenza B by PCR NEGATIVE NEGATIVE Final    Comment: (NOTE) The Xpert Xpress SARS-CoV-2/FLU/RSV plus assay is intended as an aid in the diagnosis of influenza from Nasopharyngeal swab specimens and should not be used as a sole basis for treatment. Nasal washings and aspirates are unacceptable for Xpert Xpress SARS-CoV-2/FLU/RSV testing.  Fact Sheet for Patients: BloggerCourse.com  Fact Sheet for Healthcare Providers: SeriousBroker.it  This test is not yet approved or cleared by the [place]  FDA and has been authorized for detection and/or diagnosis of SARS-CoV-2 by FDA under an Emergency Use Authorization (EUA). This EUA will remain in effect (meaning this test can be used) for the duration of the COVID-19 declaration under Section 564(b)(1) of the Act, 21 U.S.C. section 360bbb-3(b)(1), unless the authorization is terminated or revoked.     Resp Syncytial Virus by PCR NEGATIVE NEGATIVE Final    Comment: (NOTE) Fact Sheet for Patients: BloggerCourse.com  Fact Sheet for Healthcare Providers: SeriousBroker.it  This test is not yet approved or cleared by the [place]  FDA and has been authorized for detection and/or diagnosis of SARS-CoV-2 by FDA under an Emergency Use Authorization (EUA). This EUA will remain in effect (meaning this test can be used) for the duration of the COVID-19 declaration under Section 564(b)(1) of the Act, 21 U.S.C. section 360bbb-3(b)(1), unless the authorization is  terminated or revoked.  Performed at [facility], [address] [place], [place] [address]     Labs: CBC: Recent Labs  Lab 11/12/23 0004 11/13/23 0502 11/14/23 0435  WBC 11.3* 9.5 6.3  NEUTROABS 9.6*  --   --   HGB 12.4* 9.5* 9.6*  HCT 37.4* 30.3* 30.1*  MCV 93.7 95.9 96.2  PLT 268 244 269   Basic Metabolic Panel: Recent Labs  Lab 11/12/23 0004 11/13/23 0502 11/14/23 0435 11/15/23 0519  NA 136 133* 138 140  K 4.3 3.5 3.2* 3.9  CL 100 100 102 103  CO2 21* 26 27 27   GLUCOSE 101* 86 100* 100*  BUN 17 14 7* 5*  CREATININE 0.99 0.74 0.70 0.81  CALCIUM  9.3 7.8* 8.2* 8.7*   Liver Function Tests: Recent Labs  Lab 11/12/23 0004 11/13/23 0502 11/14/23 0435 11/15/23 0519  AST 121* 30 22 18   ALT 263* 109* 86* 69*  ALKPHOS 1,409* 679* 621* 572*  BILITOT 4.3* 1.1 0.8 0.7  PROT 6.9 5.5* 6.0* 5.9*  ALBUMIN  4.0 2.5* 2.6* 2.7*   CBG: Recent Labs  Lab 11/12/23 0001  GLUCAP 104*    Discharge time spent: less than 30 minutes.  Signed: Toribio Door, MD [facility] 11/15/2023

## 2023-11-15 NOTE — Progress Notes (Signed)
 Patient ID: Miguel [name], male   DOB: [birth year], 73 y.o.   MRN: [id number]   Acute Care Surgery Service Progress Note:    Chief Complaint/Subjective: No n/v Some mild 'chronic' abd pain this am - points to L side Wants to eat HIDA was negative for acute cholecystitis  Objective: Vital signs in last 24 hours: Temp:  [98.7 F (37.1 C)-99.7 F (37.6 C)] 98.7 F (37.1 C) (08/19 0626) Pulse Rate:  [79-89] 89 (08/19 0626) Resp:  [16-18] 18 (08/19 0626) BP: (133-180)/(73-86) 180/86 (08/19 0626) SpO2:  [96 %-97 %] 97 % (08/19 0626) Last BM Date : 11/14/23  Intake/Output from previous day: 08/18 0701 - 08/19 0700 In: 1910 [P.O.:1560; IV Piggyback:350] Out: 1325 [Urine:1325] Intake/Output this shift: No intake/output data recorded.  Lungs: cta, nonlabored  Cardiovascular: reg  Abd: soft, min TTP, swiss cheese hernias, no rebound  Extremities: no edema, +SCDs  Neuro: alert, nonfocal  Lab Results: CBC  Recent Labs    11/13/23 0502 11/14/23 0435  WBC 9.5 6.3  HGB 9.5* 9.6*  HCT 30.3* 30.1*  PLT 244 269   BMET Recent Labs    11/14/23 0435 11/15/23 0519  NA 138 140  K 3.2* 3.9  CL 102 103  CO2 27 27  GLUCOSE 100* 100*  BUN 7* 5*  CREATININE 0.70 0.81  CALCIUM  8.2* 8.7*   LFT    Latest Ref Rng & Units 11/15/2023    5:19 AM 11/14/2023    4:35 AM 11/13/2023    5:02 AM  Hepatic Function  Total Protein 6.5 - 8.1 g/dL 5.9  6.0  5.5   Albumin  3.5 - 5.0 g/dL 2.7  2.6  2.5   AST 15 - 41 U/L 18  22  30    ALT 0 - 44 U/L 69  86  109   Alk Phosphatase 38 - 126 U/L 572  621  679   Total Bilirubin 0.0 - 1.2 mg/dL 0.7  0.8  1.1    PT/INR No results for input(s): LABPROT, INR in the last 72 hours. ABG No results for input(s): PHART, HCO3 in the last 72 hours.  Invalid input(s): PCO2, PO2  Studies/Results:  Anti-infectives: Anti-infectives (From admission, onward)    Start     Dose/Rate Route Frequency Ordered Stop   11/12/23 1200  cefTRIAXone   (ROCEPHIN ) 1 g in sodium chloride  0.9 % 100 mL IVPB        1 g 200 mL/hr over 30 Minutes Intravenous Every 24 hours 11/12/23 0916     11/12/23 1100  azithromycin  (ZITHROMAX ) 500 mg in sodium chloride  0.9 % 250 mL IVPB        500 mg 250 mL/hr over 60 Minutes Intravenous Every 24 hours 11/12/23 0916     11/12/23 0015  ceFEPIme  (MAXIPIME ) 2 g in sodium chloride  0.9 % 100 mL IVPB        2 g 200 mL/hr over 30 Minutes Intravenous  Once 11/12/23 0004 11/12/23 0325   11/12/23 0015  metroNIDAZOLE  (FLAGYL ) IVPB 500 mg        500 mg 100 mL/hr over 60 Minutes Intravenous  Once 11/12/23 0004 11/12/23 0324   11/12/23 0015  vancomycin  (VANCOCIN ) IVPB 1000 mg/200 mL premix        1,000 mg 200 mL/hr over 60 Minutes Intravenous  Once 11/12/23 0004 11/12/23 0325       Medications: Scheduled Meds:  amLODipine   5 mg Oral Daily   calcium  carbonate  1 tablet Oral TID   enoxaparin  (  LOVENOX ) injection  40 mg Subcutaneous Q24H   finasteride   5 mg Oral Daily   gabapentin   100 mg Oral TID   pantoprazole   20 mg Oral Daily   tamsulosin   0.4 mg Oral Daily   Continuous Infusions:  azithromycin  Stopped (11/14/23 1321)   cefTRIAXone  (ROCEPHIN )  IV Stopped (11/14/23 1500)   PRN Meds:.acetaminophen  **OR** acetaminophen , albuterol , alum & mag hydroxide-simeth, HYDROmorphone  (DILAUDID ) injection, LORazepam , ondansetron  **OR** ondansetron  (ZOFRAN ) IV, oxyCODONE , zolpidem   Assessment/Plan: Patient Active Problem List   Diagnosis Date Noted   Multifocal pneumonia 11/13/2023   Hypoxia 11/13/2023   Acute gallstone pancreatitis 11/13/2023   Aortic atherosclerosis (HCC) 11/13/2023   Sepsis due to pneumonia (HCC) 11/12/2023   Effusion, right knee    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   Bilateral inguinal hernia 09/28/2018   Incisional hernia 03/17/2018   Trigger thumb, left thumb 01/19/2018   Trigger finger, left index finger 07/18/2017   Trigger finger, right ring finger 01/31/2017    Trigger finger of left thumb 01/03/2017   Trigger finger of right thumb 01/03/2017   Trigger index finger of left hand 01/03/2017   Trigger index finger of right hand 01/03/2017   Malnutrition of moderate degree 07/19/2016   Intra-abdominal abscess (HCC) 07/16/2016   Cellulitis 07/15/2016   S/P colostomy takedown 06/28/2016   Pain in thoracic spine 06/09/2016   Mid back pain 06/09/2016   Postoperative fever 11/19/2015   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   Severe protein-calorie malnutrition (HCC) 11/17/2015   Sepsis (HCC) 11/14/2015   Hiccups 11/14/2015   AKI (acute kidney injury) (HCC) 11/14/2015   Fever    Leg swelling    Left shoulder pain    Muscle spasm of left shoulder    GI bleed 10/27/2015   Acute blood loss anemia 10/27/2015   Syncope 10/27/2015   Hyperglycemia 10/27/2015   Hypotension 10/27/2015   Neck pain 10/27/2015   Hematemesis 10/27/2015   Hematochezia 10/27/2015   Diverticulitis of colon with perforation 07/05/2015   Gallstone pancreatitis Cholelithiasis Chronic abdominal pain from multiple prior surgeries  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  - CT AP 8/16 with acute pancreatitis without necrosis or organized collections, multifocal PNA in right lungh, distended gallbladder with multiple stones and mild wall thickening, wall thickening around esophagus and stomach, aortic atherosclerosis  - MRCP 8/16 without choledocholithiasis, sludge and small stones in the gallbladder without cholecystitis, mild dilatation of CBD, acute pancreatitis without organized collections, small bilateral pleural effusions - lipase 1124 on admit, 186 today; Tbili was 4.3 on admit and now 0.8 - Alk Phos/AST/ALT all trending down - no leukocytosis but patient clinically with some RUQ TTP on exam today  - Bilroth II anatomy will likely make any ERCP difficult. Anticipate that minimally invasive surgical intervention may not be possible given extensive surgical history  and recurrent ventral hernias.  -HIDA 8/19 - negative for cholecystitis  If acute cholecystitis present would recommend percutaneous cholecystostomy placement.   FEN:adv to soft diet VTE: LMWH ID: rocephin /azithromycin    - per TRH -  Multifocal PNA of R lung Acute normocytic anemia  Chronic back pain GERD Aortic atherosclerosis  HTN HLD BPH    Dispo:  I think pt can go home if tolerates soft diet; rec bland diet; doesn't need abx for pancreatitis; rec outpt CMET with PCP in 1 week;  if has ongoing GB issues - may could get cholecystostomy tube and discussion with IR for spyglass procedure   I reviewed Consultant GI notes, hospitalist notes,  last 24 h vitals and pain scores, last 48 h intake and output, last 24 h labs and trends, and last 24 h imaging results.   This care required moderate level of medical decision making.   LOS: 3 days    Camellia HERO. Tanda, MD, FACS General, Bariatric, & Minimally Invasive Surgery [phone] [facility], [facility]

## 2023-11-15 NOTE — TOC Transition Note (Signed)
 Transition of Care [facility]) - Discharge Note   Patient Details  Name: Miguel [name] MRN: [id number] Date of Birth: [birth year]  Transition of Care [facility]) CM/SW Contact:  Bascom Service, RN Phone Number: 11/15/2023, 12:39 PM   Clinical Narrative: d/c home no needs.      Final next level of care: Home/Self Care Barriers to Discharge: No Barriers Identified   Patient Goals and CMS Choice Patient states their goals for this hospitalization and ongoing recovery are:: Home CMS Medicare.gov Compare Post Acute Care list provided to:: Patient Choice offered to / list presented to : Patient [place] ownership interest in [facility].provided to:: Patient    Discharge Placement                       Discharge Plan and Services Additional resources added to the After Visit Summary for                                       Social Drivers of Health (SDOH) Interventions SDOH Screenings   Food Insecurity: No Food Insecurity (11/12/2023)  Housing: Low Risk  (11/12/2023)  Transportation Needs: No Transportation Needs (11/12/2023)  Utilities: Not At Risk (11/12/2023)  Social Connections: Socially Integrated (11/12/2023)  Tobacco Use: Medium Risk (11/11/2023)     Readmission Risk Interventions     No data to display

## 2023-11-16 ENCOUNTER — Encounter (HOSPITAL_BASED_OUTPATIENT_CLINIC_OR_DEPARTMENT_OTHER): Admitting: Physical Therapy

## 2023-11-17 LAB — CULTURE, BLOOD (ROUTINE X 2): Culture: NO GROWTH

## 2023-11-22 ENCOUNTER — Encounter (HOSPITAL_BASED_OUTPATIENT_CLINIC_OR_DEPARTMENT_OTHER): Admitting: Physical Therapy

## 2023-11-25 DIAGNOSIS — Z8701 Personal history of pneumonia (recurrent): Secondary | ICD-10-CM | POA: Diagnosis not present

## 2023-11-25 DIAGNOSIS — I10 Essential (primary) hypertension: Secondary | ICD-10-CM | POA: Diagnosis not present

## 2023-11-25 DIAGNOSIS — J189 Pneumonia, unspecified organism: Secondary | ICD-10-CM | POA: Diagnosis not present

## 2023-11-25 DIAGNOSIS — K851 Biliary acute pancreatitis without necrosis or infection: Secondary | ICD-10-CM | POA: Diagnosis not present

## 2023-12-20 DIAGNOSIS — R748 Abnormal levels of other serum enzymes: Secondary | ICD-10-CM | POA: Diagnosis not present

## 2024-01-09 ENCOUNTER — Ambulatory Visit
Admission: RE | Admit: 2024-01-09 | Discharge: 2024-01-09 | Disposition: A | Discharge status: Home / Self Care | Source: Ambulatory Visit | Attending: Family Medicine | Admitting: Family Medicine

## 2024-01-09 DIAGNOSIS — I700 Atherosclerosis of aorta: Secondary | ICD-10-CM | POA: Diagnosis not present

## 2024-01-09 DIAGNOSIS — R918 Other nonspecific abnormal finding of lung field: Secondary | ICD-10-CM

## 2024-01-13 DIAGNOSIS — R319 Hematuria, unspecified: Secondary | ICD-10-CM | POA: Diagnosis not present

## 2024-01-13 DIAGNOSIS — R300 Dysuria: Secondary | ICD-10-CM | POA: Diagnosis not present

## 2024-01-13 DIAGNOSIS — Z23 Encounter for immunization: Secondary | ICD-10-CM | POA: Diagnosis not present

## 2024-02-13 DIAGNOSIS — R109 Unspecified abdominal pain: Secondary | ICD-10-CM | POA: Diagnosis not present

## 2024-02-13 DIAGNOSIS — K219 Gastro-esophageal reflux disease without esophagitis: Secondary | ICD-10-CM | POA: Diagnosis not present

## 2024-02-13 DIAGNOSIS — M545 Low back pain, unspecified: Secondary | ICD-10-CM | POA: Diagnosis not present

## 2024-02-13 DIAGNOSIS — R972 Elevated prostate specific antigen [PSA]: Secondary | ICD-10-CM | POA: Diagnosis not present

## 2024-02-13 DIAGNOSIS — Z131 Encounter for screening for diabetes mellitus: Secondary | ICD-10-CM | POA: Diagnosis not present

## 2024-02-13 DIAGNOSIS — D509 Iron deficiency anemia, unspecified: Secondary | ICD-10-CM | POA: Diagnosis not present

## 2024-02-13 DIAGNOSIS — I10 Essential (primary) hypertension: Secondary | ICD-10-CM | POA: Diagnosis not present

## 2024-02-13 DIAGNOSIS — Z0000 Encounter for general adult medical examination without abnormal findings: Secondary | ICD-10-CM | POA: Diagnosis not present

## 2024-02-13 DIAGNOSIS — E7800 Pure hypercholesterolemia, unspecified: Secondary | ICD-10-CM | POA: Diagnosis not present

## 2024-02-13 DIAGNOSIS — G4700 Insomnia, unspecified: Secondary | ICD-10-CM | POA: Diagnosis not present

## 2024-02-13 DIAGNOSIS — F419 Anxiety disorder, unspecified: Secondary | ICD-10-CM | POA: Diagnosis not present

## 2024-02-27 DIAGNOSIS — K859 Acute pancreatitis without necrosis or infection, unspecified: Secondary | ICD-10-CM | POA: Diagnosis not present

## 2024-02-27 DIAGNOSIS — R1084 Generalized abdominal pain: Secondary | ICD-10-CM | POA: Diagnosis not present

## 2024-03-01 ENCOUNTER — Encounter (HOSPITAL_COMMUNITY): Payer: Self-pay | Admitting: Surgery

## 2024-03-07 DIAGNOSIS — I10 Essential (primary) hypertension: Secondary | ICD-10-CM | POA: Diagnosis not present

## 2024-04-03 ENCOUNTER — Encounter: Payer: Self-pay | Admitting: Physician Assistant

## 2024-04-03 ENCOUNTER — Ambulatory Visit (INDEPENDENT_AMBULATORY_CARE_PROVIDER_SITE_OTHER): Admitting: Physician Assistant

## 2024-04-03 ENCOUNTER — Other Ambulatory Visit (INDEPENDENT_AMBULATORY_CARE_PROVIDER_SITE_OTHER): Payer: Self-pay

## 2024-04-03 DIAGNOSIS — M25062 Hemarthrosis, left knee: Secondary | ICD-10-CM | POA: Diagnosis not present

## 2024-04-03 DIAGNOSIS — M25562 Pain in left knee: Secondary | ICD-10-CM

## 2024-04-03 MED ORDER — LIDOCAINE HCL 1 % IJ SOLN
3.0000 mL | INTRAMUSCULAR | Status: AC | PRN
  Administered 2024-04-03: 3 mL

## 2024-04-03 NOTE — Progress Notes (Deleted)
 "  Office Visit Note   Patient: Miguel [name]           Date of Birth: 03-Sep-1950           MRN: [id number] Visit Date: 04/03/2024              Requested by: Leonel Cole, MD [address] [place],  [place] [address] PCP: Leonel Cole, MD  No chief complaint on file.     HPI: ***  Assessment & Plan: Visit Diagnoses:  1. Acute pain of left knee     Plan: ***  Follow-Up Instructions: No follow-ups on file.   Ortho Exam  Patient is alert, oriented, no adenopathy, well-dressed, normal affect, normal respiratory effort. ***    Imaging: No results found. No images are attached to the encounter.  Labs: Lab Results  Component Value Date   HGBA1C 5.5 06/21/2016   HGBA1C 5.9 (H) 10/27/2015   REPTSTATUS 11/17/2023 FINAL 11/12/2023   GRAMSTAIN  08/04/2016    FEW WBC PRESENT,BOTH PMN AND MONONUCLEAR NO ORGANISMS SEEN    CULT  11/12/2023    NO GROWTH 5 DAYS Performed at [facility], [address], [place], [place] [address]    [facility] ESCHERICHIA COLI 08/04/2016   LABORGA ESCHERICHIA COLI 08/04/2016     Lab Results  Component Value Date   ALBUMIN  2.7 (L) 11/15/2023   ALBUMIN  2.6 (L) 11/14/2023   ALBUMIN  2.5 (L) 11/13/2023   PREALBUMIN 15.6 (L) 12/23/2015   PREALBUMIN 18.7 12/01/2015   PREALBUMIN 5.0 (L) 11/24/2015    Lab Results  Component Value Date   MG 1.9 12/13/2015   MG 2.0 12/01/2015   MG 1.9 11/27/2015   No results found for: VD25OH  Lab Results  Component Value Date   PREALBUMIN 15.6 (L) 12/23/2015   PREALBUMIN 18.7 12/01/2015   PREALBUMIN 5.0 (L) 11/24/2015      Latest Ref Rng & Units 11/14/2023    4:35 AM 11/13/2023    5:02 AM 11/12/2023   12:04 AM  CBC EXTENDED  WBC 4.0 - 10.5 K/uL 6.3  9.5  11.3   RBC 4.22 - 5.81 MIL/uL 3.13  3.16  3.99   Hemoglobin 13.0 - 17.0 g/dL 9.6  9.5  87.5   HCT 60.9 - 52.0 % 30.1  30.3  37.4   Platelets 150 - 400 K/uL 269  244  268   NEUT# 1.7 - 7.7 K/uL   9.6   Lymph# 0.7 - 4.0  K/uL   1.2      There is no height or weight on file to calculate BMI.  Orders:  Orders Placed This Encounter  Procedures   XR KNEE 3 VIEW LEFT   No orders of the defined types were placed in this encounter.    Procedures: No procedures performed  Clinical Data: No additional findings.  ROS:  All other systems negative, except as noted in the HPI. Review of Systems  Objective: Vital Signs: There were no vitals taken for this visit.  Specialty Comments:  MRI LUMBAR SPINE WITHOUT CONTRAST     TECHNIQUE:  Multiplanar, multisequence MR imaging of the lumbar spine was  performed. No intravenous contrast was administered.     COMPARISON:  Lumbar spine radiographs 03/11/2020     FINDINGS:  Segmentation: 5 non rib-bearing lumbar type vertebral bodies are  present. The lowest fully formed vertebral body is L5.     Alignment: Slight retrolisthesis is present at T12-L1, L1-2 and  L2-3.  Lumbar lordosis is preserved.     Vertebrae:  Marrow signal and vertebral body heights are normal.     Conus medullaris and cauda equina: Conus extends to the L1-2 level.  Conus and cauda equina appear normal.     Paraspinal and other soft tissues: Layering gallstones noted. No  inflammatory change to suggest cholecystitis. 11 mm simple cyst  noted posteriorly in the right kidney. No other solid organ lesions  are present. No significant adenopathy is present.     Disc levels:     T12-L1: Mild disc bulging without significant stenosis.     L1-2: Retrolisthesis and uncovering of the disc contributes to mild  foraminal narrowing bilaterally.     L2-3: Retrolisthesis and uncovering of a broad-based disc protrusion  contribute to mild foraminal narrowing bilaterally, left greater  than right.     L3-4: Broad-based disc protrusion extends into the foramina.  Moderate facet hypertrophy is noted bilaterally. Mild central and  bilateral foraminal narrowing is present.     L4-5: A  broad-based disc protrusion is present. Moderate facet  hypertrophy is noted. Mild central and mild to moderate foraminal  narrowing is worse right than left.     L5-S1: Facet and endplate spurring contribute to moderate left and  mild right foraminal narrowing.     IMPRESSION:  1. Multilevel spondylosis of the lumbar spine as described.  2. Mild foraminal narrowing bilaterally at L1-2 and L2-3 is worse on  the left. Slight retrolisthesis and uncovering of a broad-based disc  protrusion contribute at both levels.  3. Mild central and bilateral foraminal narrowing at L3-4 secondary  to a broad-based disc protrusion and facet hypertrophy.  4. Mild central and mild to moderate foraminal narrowing bilaterally  at L4-5 is worse right than left.  5. Moderate left and mild right foraminal stenosis at L5-S1 due to  endplate and facet spurring.  6. Cholelithiasis without evidence for cholecystitis.        Electronically Signed    By: Lonni Necessary M.D.    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   Bilateral inguinal hernia 09/28/2018   Incisional hernia 03/17/2018   Trigger thumb, left thumb 01/19/2018   Trigger finger, left index finger 07/18/2017   Trigger finger, right ring finger 01/31/2017   Trigger finger of left thumb 01/03/2017   Trigger finger of right thumb 01/03/2017   Trigger index finger of left hand 01/03/2017   Trigger index finger of right hand 01/03/2017   Malnutrition of moderate degree 07/19/2016   Intra-abdominal abscess (HCC) 07/16/2016   Cellulitis 07/15/2016   S/P colostomy takedown 06/28/2016   Pain in thoracic spine 06/09/2016   Mid back pain  06/09/2016   Postoperative fever 11/19/2015   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   Severe protein-calorie malnutrition 11/17/2015   Sepsis (HCC) 11/14/2015   Hiccups 11/14/2015   AKI (acute kidney injury) 11/14/2015   Fever    Leg swelling    Left shoulder pain    Muscle spasm of left shoulder    GI bleed 10/27/2015   Acute blood loss anemia 10/27/2015   Syncope 10/27/2015   Hyperglycemia 10/27/2015   Hypotension 10/27/2015   Neck pain 10/27/2015  Hematemesis 10/27/2015   Hematochezia 10/27/2015   Diverticulitis of colon with perforation 07/05/2015   Past Medical History:  Diagnosis Date   Anemia    Anxiety    BPH (benign prostatic hyperplasia)    Gastritis    GERD (gastroesophageal reflux disease)    seldom (07/16/2016)   GI bleed due to NSAIDs 10/27/2015   History of blood transfusion 06/2016   post OR/notes 07/15/2016   History of hiatal hernia    History of kidney stones    surgery to remove stone   Hyperlipidemia    Hypertension    no meds    Melanoma of back (HCC)    mid back   Sigmoid diverticulitis    with perforation    Family History  Problem Relation Age of Onset   Stroke Mother    Stroke Brother    Heart disease Brother     Past Surgical History:  Procedure Laterality Date   COLON SURGERY     sigmoid   COLOSTOMY TAKEDOWN N/A 06/28/2016   Procedure: COLOSTOMY TAKEDOWN;  Surgeon: Vicenta Poli, MD;  Location: [facility] OR;  Service: General;  Laterality: N/A;   CYSTOSCOPY WITH RETROGRADE PYELOGRAM, URETEROSCOPY AND STENT PLACEMENT Bilateral 02/12/2020   Procedure: CYSTOSCOPY WITH BILATERAL RETROGRADE PYELOGRAM,  AND LITHOPEXY;  Surgeon: Rosalind Zachary NOVAK, MD;  Location: [facility] ORS;  Service: Urology;  Laterality: Bilateral;  1 HR   ESOPHAGOGASTRODUODENOSCOPY N/A 10/27/2015   Procedure: ESOPHAGOGASTRODUODENOSCOPY (EGD);  Surgeon: Oliva Boots, MD;  Location: [facility] ENDOSCOPY;  Service: Endoscopy;  Laterality: N/A;   ESOPHAGOGASTRODUODENOSCOPY N/A 10/29/2015    Procedure: ESOPHAGOGASTRODUODENOSCOPY (EGD);  Surgeon: Lamar Bunk, MD;  Location: [facility] ENDOSCOPY;  Service: Endoscopy;  Laterality: N/A;   ESOPHAGOGASTRODUODENOSCOPY N/A 10/30/2015   Procedure: ESOPHAGOGASTRODUODENOSCOPY (EGD);  Surgeon: Oliva Boots, MD;  Location[facility] ENDOSCOPY;  Service: Endoscopy;  Laterality: N/A;   g tube discontinued  04/2020   per patient   GASTROSTOMY TUBE PLACEMENT  11/21/2015   REDUCTION OF HIATAL HERNIA , REPAIR HIATAL HERNIA, RESECTION SMALL BOWEL WITH ANASTOMOSIS, PLACEMENT GASTROSTOMY TUBE, PLACEMENT DUODENOSTOMY TUBE (N/A)   HEMORRHOID BANDING  X 2   HERNIA REPAIR     HIATAL HERNIA REPAIR N/A 11/21/2015   Procedure: REDUCTION OF HIATAL HERNIA , REPAIR HIATAL HERNIA, RESECTION SMALL BOWEL WITH ANASTOMOSIS, PLACEMENT GASTROSTOMY TUBE, PLACEMENT DUODENOSTOMY TUBE;  Surgeon: Herlene Beverley Bureau, MD;  Location: [facility] OR;  Service: General;  Laterality: N/A;   HOLMIUM LASER APPLICATION Right 02/12/2020   Procedure: HOLMIUM LASER APPLICATION;  Surgeon: Rosalind Zachary NOVAK, MD;  Location: [facility]S;  Service: Urology;  Laterality: Right;   INCISIONAL HERNIA REPAIR  06/28/2016   open/notes 07/15/2016   INCISIONAL HERNIA REPAIR  03/17/2018   WITH MESH   INCISIONAL HERNIA REPAIR N/A 03/17/2018   Procedure: INCISIONAL HERNIA REPAIR WITH MESH;  Surgeon: Poli Vicenta, MD;  Location: [facility] OR;  Service: General;  Laterality: N/A;   INCISIONAL HERNIA REPAIR N/A 10/16/2020   Procedure: SHIRLENE HERNIA REPAIR WITH MESH;  Surgeon: Poli Vicenta, MD;  Location: [facility] OR;  Service: General;  Laterality: N/A;   INGUINAL HERNIA REPAIR Bilateral 09/28/2018   INGUINAL HERNIA REPAIR Bilateral 09/28/2018   Procedure: BILATERAL OPEN INGUINAL HERNIA REPAIR WITH MESH;  Surgeon: Poli Vicenta, MD;  Location: [facility] OR;  Service: General;  Laterality: Bilateral;  GENERAL AND TAP BLOCK   INSERTION OF MESH N/A 03/17/2018   Procedure: INSERTION OF MESH;  Surgeon: Poli Vicenta, MD;  Location: [facility] OR;   Service: General;  Laterality: N/A;   IR  CM INJ ANY COLONIC TUBE W/FLUORO  02/04/2017   IR GUIDED DRAIN W CATHETER PLACEMENT  07/06/2016   /NOTES 07/15/2016   IR PATIENT EVAL TECH 0-60 MINS  06/28/2019   IR RADIOLOGIST EVAL & MGMT  07/27/2016   IR RADIOLOGIST EVAL & MGMT  08/17/2016   IR RADIOLOGIST EVAL & MGMT  08/26/2016   IR REPLACE G-TUBE SIMPLE WO FLUORO  07/28/2017   IR REPLACE G-TUBE SIMPLE WO FLUORO  01/31/2018   IR REPLACE G-TUBE SIMPLE WO FLUORO  10/16/2018   IR REPLACE G-TUBE SIMPLE WO FLUORO  03/05/2019   IR REPLACE G-TUBE SIMPLE WO FLUORO  08/22/2019   IR REPLACE G-TUBE SIMPLE WO FLUORO  01/08/2020   IR REPLC GASTRO/COLONIC TUBE PERCUT W/FLUORO  08/04/2016   IR REPLC GASTRO/COLONIC TUBE PERCUT W/FLUORO  01/14/2017   IR US  GUIDE BX ASP/DRAIN  08/04/2016   KNEE CARTILAGE SURGERY Right 1971   opened me up   LAPAROTOMY N/A 07/05/2015   Procedure: PARTIAL SIGMOID COLECTOMY AND COLOSTOMY;  Surgeon: Vicenta Poli, MD;  Location: [facility] OR;  Service: General;  Laterality: N/A;   MELANOMA EXCISION  2001   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 [place]  Vaping Use   Vaping status: Never Used  Substance and Sexual Activity   Alcohol  use: No   Drug use: No   Sexual activity: Not Currently       "

## 2024-04-03 NOTE — Progress Notes (Signed)
 "  Office Visit Note   Patient: Miguel [name]           Date of Birth: 12/19/1950           MRN: [id number] Visit Date: 04/03/2024              Requested by: Leonel Cole, MD [address] [place],  [place] [address] PCP: Leonel Cole, MD   Assessment & Plan: Visit Diagnoses:  1. Acute pain of left knee     Plan: Patient is a pleasant 74 year old gentleman sustained a twisting fall yesterday while walking his dog.  Did not directly fall on his left knee but fell on his right side but describes a significant twisting injury to the left knee did not hear a pop.  Does have a history of osteoarthritis of both knees.  He has been having trouble placing weight on it has been using a walker.  He had no visible fracture on x-rays other than he has arthritis could not appreciate fracture he had does have active motion in both patellar and quadricep tendon.  I did go forward and aspirate his knee and aspirated 70 cc of frank blood.  Because of this I think we need to get an MRI to address what injury may have happened.  In the meantime we will place him in a knee brace told him to minimize his weightbearing with a walker.  I also asked that he do ankle pumps.  Follow-Up Instructions: No follow-ups on file.   Orders:  Orders Placed This Encounter  Procedures   XR KNEE 3 VIEW LEFT   No orders of the defined types were placed in this encounter.     Procedures: Large Joint Inj: L knee on 04/03/2024 1:47 PM Indications: pain, diagnostic evaluation and joint swelling Details: 25 G 1.5 in needle, superolateral approach  Arthrogram: No  Medications: 3 mL lidocaine  1 % Aspirate: 70 mL bloody Outcome: tolerated well, no immediate complications  After verbal consent was obtained superior lateral pouch was prepped with alcohol  and Betadine .  3 cc of lidocaine  plain was injected.  After adequate analgesia repeat prep was done.  18-gauge needle on a 35 cc syringe was inserted.  Altogether  70 cc of frank blood was aspirated from the knee.  Band-Aid and Ace wrap placed patient tolerated procedure well Procedure, treatment alternatives, risks and benefits explained, specific risks discussed. Consent was given by the patient.       Clinical Data: No additional findings.   Subjective: No chief complaint on file.   HPI patient is a pleasant 74 year old gentleman who comes in with a chief complaint of left knee pain.  This happened yesterday in the evening.  After his knee got jarred when he was walking a dog.  He is taking tramadol  and oxycodone .  Review of Systems  All other systems reviewed and are negative.    Objective: Vital Signs: There were no vitals taken for this visit.  Physical Exam Constitutional:      Appearance: Normal appearance.  Skin:    General: Skin is warm and dry.  Neurological:     General: No focal deficit present.     Mental Status: He is alert.     Ortho Exam Examination of his knee he has no ecchymosis no erythema.  He has a moderate plus sized effusion.  He has no tenderness over the medial lateral joint line.  He can actively extend and do a straight leg  raise.  Has a little bit more translation with anterior draw.  Compartments are soft and nontender Specialty Comments:  MRI LUMBAR SPINE WITHOUT CONTRAST     TECHNIQUE:  Multiplanar, multisequence MR imaging of the lumbar spine was  performed. No intravenous contrast was administered.     COMPARISON:  Lumbar spine radiographs 03/11/2020     FINDINGS:  Segmentation: 5 non rib-bearing lumbar type vertebral bodies are  present. The lowest fully formed vertebral body is L5.     Alignment: Slight retrolisthesis is present at T12-L1, L1-2 and  L2-3. Lumbar lordosis is preserved.     Vertebrae:  Marrow signal and vertebral body heights are normal.     Conus medullaris and cauda equina: Conus extends to the L1-2 level.  Conus and cauda equina appear normal.     Paraspinal and  other soft tissues: Layering gallstones noted. No  inflammatory change to suggest cholecystitis. 11 mm simple cyst  noted posteriorly in the right kidney. No other solid organ lesions  are present. No significant adenopathy is present.     Disc levels:     T12-L1: Mild disc bulging without significant stenosis.     L1-2: Retrolisthesis and uncovering of the disc contributes to mild  foraminal narrowing bilaterally.     L2-3: Retrolisthesis and uncovering of a broad-based disc protrusion  contribute to mild foraminal narrowing bilaterally, left greater  than right.     L3-4: Broad-based disc protrusion extends into the foramina.  Moderate facet hypertrophy is noted bilaterally. Mild central and  bilateral foraminal narrowing is present.     L4-5: A broad-based disc protrusion is present. Moderate facet  hypertrophy is noted. Mild central and mild to moderate foraminal  narrowing is worse right than left.     L5-S1: Facet and endplate spurring contribute to moderate left and  mild right foraminal narrowing.     IMPRESSION:  1. Multilevel spondylosis of the lumbar spine as described.  2. Mild foraminal narrowing bilaterally at L1-2 and L2-3 is worse on  the left. Slight retrolisthesis and uncovering of a broad-based disc  protrusion contribute at both levels.  3. Mild central and bilateral foraminal narrowing at L3-4 secondary  to a broad-based disc protrusion and facet hypertrophy.  4. Mild central and mild to moderate foraminal narrowing bilaterally  at L4-5 is worse right than left.  5. Moderate left and mild right foraminal stenosis at L5-S1 due to  endplate and facet spurring.  6. Cholelithiasis without evidence for cholecystitis.        Electronically Signed    By: Lonni Necessary M.D.    On: 12/28/2020 07:23  Imaging: No results found.   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   Bilateral inguinal hernia 09/28/2018   Incisional hernia 03/17/2018   Trigger thumb, left thumb 01/19/2018   Trigger finger, left index finger 07/18/2017   Trigger finger, right ring finger 01/31/2017   Trigger finger of left thumb 01/03/2017   Trigger finger of right thumb 01/03/2017   Trigger index finger of left hand 01/03/2017   Trigger index finger of right hand 01/03/2017   Malnutrition of moderate degree 07/19/2016   Intra-abdominal abscess (HCC) 07/16/2016   Cellulitis 07/15/2016  S/P colostomy takedown 06/28/2016   Pain in thoracic spine 06/09/2016   Mid back pain 06/09/2016   Postoperative fever 11/19/2015   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   Severe protein-calorie malnutrition 11/17/2015   Sepsis (HCC) 11/14/2015   Hiccups 11/14/2015   AKI (acute kidney injury) 11/14/2015   Fever    Leg swelling    Left shoulder pain    Muscle spasm of left shoulder    GI bleed 10/27/2015   Acute blood loss anemia 10/27/2015   Syncope 10/27/2015   Hyperglycemia 10/27/2015   Hypotension 10/27/2015   Neck pain 10/27/2015   Hematemesis 10/27/2015   Hematochezia 10/27/2015   Diverticulitis of colon with perforation 07/05/2015   Past Medical History:  Diagnosis Date   Anemia    Anxiety    BPH (benign prostatic hyperplasia)    Gastritis    GERD (gastroesophageal reflux disease)    seldom (07/16/2016)   GI bleed due to NSAIDs 10/27/2015   History of blood transfusion 06/2016   post OR/notes 07/15/2016   History of hiatal hernia    History of kidney stones    surgery to remove stone   Hyperlipidemia    Hypertension    no meds    Melanoma of back (HCC)    mid back   Sigmoid diverticulitis    with perforation    Family History  Problem Relation Age of Onset    Stroke Mother    Stroke Brother    Heart disease Brother     Past Surgical History:  Procedure Laterality Date   COLON SURGERY     sigmoid   COLOSTOMY TAKEDOWN N/A 06/28/2016   Procedure: COLOSTOMY TAKEDOWN;  Surgeon: Vicenta Poli, MD;  Location: [facility] OR;  Service: General;  Laterality: N/A;   CYSTOSCOPY WITH RETROGRADE PYELOGRAM, URETEROSCOPY AND STENT PLACEMENT Bilateral 02/12/2020   Procedure: CYSTOSCOPY WITH BILATERAL RETROGRADE PYELOGRAM,  AND LITHOPEXY;  Surgeon: Rosalind Zachary NOVAK, MD;  Location: [facility] ORS;  Service: Urology;  Laterality: Bilateral;  1 HR   ESOPHAGOGASTRODUODENOSCOPY N/A 10/27/2015   Procedure: ESOPHAGOGASTRODUODENOSCOPY (EGD);  Surgeon: Oliva Boots, MD;  Location: [facility] ENDOSCOPY;  Service: Endoscopy;  Laterality: N/A;   ESOPHAGOGASTRODUODENOSCOPY N/A 10/29/2015   Procedure: ESOPHAGOGASTRODUODENOSCOPY (EGD);  Surgeon: Lamar Bunk, MD;  Location: [facility] ENDOSCOPY;  Service: Endoscopy;  Laterality: N/A;   ESOPHAGOGASTRODUODENOSCOPY N/A 10/30/2015   Procedure: ESOPHAGOGASTRODUODENOSCOPY (EGD);  Surgeon: Oliva Boots, MD;  Location: [facility] ENDOSCOPY;  Service: Endoscopy;  Laterality: N/A;   g tube discontinued  04/2020   per patient   GASTROSTOMY TUBE PLACEMENT  11/21/2015   REDUCTION OF HIATAL HERNIA , REPAIR HIATAL HERNIA, RESECTION SMALL BOWEL WITH ANASTOMOSIS, PLACEMENT GASTROSTOMY TUBE, PLACEMENT DUODENOSTOMY TUBE (N/A)   HEMORRHOID BANDING  X 2   HERNIA REPAIR     HIATAL HERNIA REPAIR N/A 11/21/2015   Procedure: REDUCTION OF HIATAL HERNIA , REPAIR HIATAL HERNIA, RESECTION SMALL BOWEL WITH ANASTOMOSIS, PLACEMENT GASTROSTOMY TUBE, PLACEMENT DUODENOSTOMY TUBE;  Surgeon: Herlene Beverley Bureau, MD;  Location: [facility] OR;  Service: General;  Laterality: N/A;   HOLMIUM LASER APPLICATION Right 02/12/2020   Procedure: HOLMIUM LASER APPLICATION;  Surgeon: Rosalind Zachary NOVAK, MD;  Location: [facility];  Service: Urology;  Laterality: Right;   INCISIONAL HERNIA REPAIR  06/28/2016   open/notes 07/15/2016    INCISIONAL HERNIA REPAIR  03/17/2018   WITH MESH   INCISIONAL HERNIA REPAIR N/A 03/17/2018   Procedure: INCISIONAL HERNIA REPAIR WITH MESH;  Surgeon: Poli Vicenta, MD;  Location[facility] OR;  Service: General;  Laterality: N/A;  INCISIONAL HERNIA REPAIR N/A 10/16/2020   Procedure: INCISIONAL HERNIA REPAIR WITH MESH;  Surgeon: [name], MD;  Location: [facility] OR;  Service: General;  Laterality: N/A;   INGUINAL HERNIA REPAIR Bilateral 09/28/2018   INGUINAL HERNIA REPAIR Bilateral 09/28/2018   Procedure: BILATERAL OPEN INGUINAL HERNIA REPAIR WITH MESH;  Surgeon: [name], MD;  Location: [facility] OR;  Service: General;  Laterality: Bilateral;  GENERAL AND TAP BLOCK   INSERTION OF MESH N/A 03/17/2018   Procedure: INSERTION OF MESH;  Surgeon: [name], MD;  Location: [facility] OR;  Service: General;  Laterality: N/A;   IR CM INJ ANY COLONIC TUBE W/FLUORO  02/04/2017   IR GUIDED DRAIN W CATHETER PLACEMENT  07/06/2016   /NOTES 07/15/2016   IR PATIENT EVAL TECH 0-60 MINS  06/28/2019   IR RADIOLOGIST EVAL & MGMT  07/27/2016   IR RADIOLOGIST EVAL & MGMT  08/17/2016   IR RADIOLOGIST EVAL & MGMT  08/26/2016   IR REPLACE G-TUBE SIMPLE WO FLUORO  07/28/2017   IR REPLACE G-TUBE SIMPLE WO FLUORO  01/31/2018   IR REPLACE G-TUBE SIMPLE WO FLUORO  10/16/2018   IR REPLACE G-TUBE SIMPLE WO FLUORO  03/05/2019   IR REPLACE G-TUBE SIMPLE WO FLUORO  08/22/2019   IR REPLACE G-TUBE SIMPLE WO FLUORO  01/08/2020   IR REPLC GASTRO/COLONIC TUBE PERCUT W/FLUORO  08/04/2016   IR REPLC GASTRO/COLONIC TUBE PERCUT W/FLUORO  01/14/2017   IR US  GUIDE BX ASP/DRAIN  08/04/2016   KNEE CARTILAGE SURGERY Right 1971   opened me up   LAPAROTOMY N/A 07/05/2015   Procedure: PARTIAL SIGMOID COLECTOMY AND COLOSTOMY;  Surgeon: Berg Vernetta, MD;  Location: [facility] OR;  Service: General;  Laterality: N/A;   MELANOMA EXCISION  2001   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  10/30/2015   Procedure: REMOVAL OF DISTAL STOMACH;   Surgeon: Lynwood Pina, MD;  Location: [facility] OR;  Service: General;;   REPAIR OF PERFORATED ULCER N/A 10/30/2015   Procedure: REPAIR OF BLEEDING  ULCER;  Surgeon: Lynwood Pina, MD;  Location: [facility] OR;  Service: General;  Laterality: N/A;   TUMOR EXCISION  2009   back; fatty tumor   Social History   Occupational History   Occupation: unable to work since April  Tobacco Use   Smoking status: Never   Smokeless tobacco: Former    Types: Snuff   Tobacco comments:    occasional snuff As of 10/23/21. [place]  Vaping Use   Vaping status: Never Used  Substance and Sexual Activity   Alcohol  use: No   Drug use: No   Sexual activity: Not Currently        "

## 2024-04-05 ENCOUNTER — Ambulatory Visit
Admission: RE | Admit: 2024-04-05 | Discharge: 2024-04-05 | Disposition: A | Discharge status: Home / Self Care | Source: Ambulatory Visit | Attending: Physician Assistant | Admitting: Physician Assistant

## 2024-04-05 DIAGNOSIS — M25562 Pain in left knee: Secondary | ICD-10-CM

## 2024-04-05 DIAGNOSIS — M25062 Hemarthrosis, left knee: Secondary | ICD-10-CM

## 2024-04-06 ENCOUNTER — Ambulatory Visit: Payer: Self-pay | Admitting: Physician Assistant

## 2024-04-19 ENCOUNTER — Other Ambulatory Visit

## 2024-04-27 ENCOUNTER — Ambulatory Visit: Admitting: Physician Assistant

## 2024-04-27 ENCOUNTER — Other Ambulatory Visit (INDEPENDENT_AMBULATORY_CARE_PROVIDER_SITE_OTHER)

## 2024-04-27 ENCOUNTER — Encounter: Payer: Self-pay | Admitting: Physician Assistant

## 2024-04-27 DIAGNOSIS — M25562 Pain in left knee: Secondary | ICD-10-CM

## 2024-04-27 NOTE — Progress Notes (Signed)
 "  Office Visit Note   Patient: Miguel [name]           Date of Birth: [birth year]           MRN: [id number] Visit Date: 04/27/2024              Requested by: Leonel Cole, MD [address] [place],  [place] [address] PCP: Leonel Cole, MD  Chief Complaint  Patient presents with   Left Knee - Pain      HPI: Mr. Miguel [name] is a pleasant 74 year old gentleman who is now 3 weeks status post left knee pain after a fall.  An MRI did demonstrate a nondisplaced comminuted tibial plateau fracture laterally.  He was advised to be nonweightbearing.  He is wearing a brace as he really does not have much pain and has been weightbearing without difficulty  Assessment & Plan: Visit Diagnoses:  1. Acute pain of left knee     Plan: Fracture remains nondisplaced.  She continue with a knee brace continue with range of motion exercises I did give him some close chain quadricep strengthening to do at a seated position will follow-up in x-rays in 3 weeks.  Follow-Up Instructions: No follow-ups on file.   Ortho Exam  Patient is alert, oriented, no adenopathy, well-dressed, normal affect, normal respiratory effort. Left knee no effusion no pain no tenderness compartments are soft and nontender negative Homans' sign.  He has good dorsiflexion plantarflexion of his ankle he has full extension and flexion to 100 degrees of his knee.  No instability.    Imaging: No results found. No images are attached to the encounter.  Labs: Lab Results  Component Value Date   HGBA1C 5.5 06/21/2016   HGBA1C 5.9 (H) 10/27/2015   REPTSTATUS 11/17/2023 FINAL 11/12/2023   GRAMSTAIN  08/04/2016    FEW WBC PRESENT,BOTH PMN AND MONONUCLEAR NO ORGANISMS SEEN    CULT  11/12/2023    NO GROWTH 5 DAYS Performed at [facility], [address] [place], [place] [address]    [facility] ESCHERICHIA COLI 08/04/2016   LABORGA ESCHERICHIA COLI 08/04/2016     Lab Results  Component Value Date   ALBUMIN   2.7 (L) 11/15/2023   ALBUMIN  2.6 (L) 11/14/2023   ALBUMIN  2.5 (L) 11/13/2023   PREALBUMIN 15.6 (L) 12/23/2015   PREALBUMIN 18.7 12/01/2015   PREALBUMIN 5.0 (L) 11/24/2015    Lab Results  Component Value Date   MG 1.9 12/13/2015   MG 2.0 12/01/2015   MG 1.9 11/27/2015   No results found for: VD25OH  Lab Results  Component Value Date   PREALBUMIN 15.6 (L) 12/23/2015   PREALBUMIN 18.7 12/01/2015   PREALBUMIN 5.0 (L) 11/24/2015      Latest Ref Rng & Units 11/14/2023    4:35 AM 11/13/2023    5:02 AM 11/12/2023   12:04 AM  CBC EXTENDED  WBC 4.0 - 10.5 K/uL 6.3  9.5  11.3   RBC 4.22 - 5.81 MIL/uL 3.13  3.16  3.99   Hemoglobin 13.0 - 17.0 g/dL 9.6  9.5  87.5   HCT 60.9 - 52.0 % 30.1  30.3  37.4   Platelets 150 - 400 K/uL 269  244  268   NEUT# 1.7 - 7.7 K/uL   9.6   Lymph# 0.7 - 4.0 K/uL   1.2      There is no height or weight on file to calculate BMI.  Orders:  Orders Placed This Encounter  Procedures  XR Knee 1-2 Views Left   No orders of the defined types were placed in this encounter.    Procedures: No procedures performed  Clinical Data: No additional findings.  ROS:  All other systems negative, except as noted in the HPI. Review of Systems  Objective: Vital Signs: There were no vitals taken for this visit.  Specialty Comments:  MRI LUMBAR SPINE WITHOUT CONTRAST     TECHNIQUE:  Multiplanar, multisequence MR imaging of the lumbar spine was  performed. No intravenous contrast was administered.     COMPARISON:  Lumbar spine radiographs 03/11/2020     FINDINGS:  Segmentation: 5 non rib-bearing lumbar type vertebral bodies are  present. The lowest fully formed vertebral body is L5.     Alignment: Slight retrolisthesis is present at T12-L1, L1-2 and  L2-3. Lumbar lordosis is preserved.     Vertebrae:  Marrow signal and vertebral body heights are normal.     Conus medullaris and cauda equina: Conus extends to the L1-2 level.  Conus and cauda  equina appear normal.     Paraspinal and other soft tissues: Layering gallstones noted. No  inflammatory change to suggest cholecystitis. 11 mm simple cyst  noted posteriorly in the right kidney. No other solid organ lesions  are present. No significant adenopathy is present.     Disc levels:     T12-L1: Mild disc bulging without significant stenosis.     L1-2: Retrolisthesis and uncovering of the disc contributes to mild  foraminal narrowing bilaterally.     L2-3: Retrolisthesis and uncovering of a broad-based disc protrusion  contribute to mild foraminal narrowing bilaterally, left greater  than right.     L3-4: Broad-based disc protrusion extends into the foramina.  Moderate facet hypertrophy is noted bilaterally. Mild central and  bilateral foraminal narrowing is present.     L4-5: A broad-based disc protrusion is present. Moderate facet  hypertrophy is noted. Mild central and mild to moderate foraminal  narrowing is worse right than left.     L5-S1: Facet and endplate spurring contribute to moderate left and  mild right foraminal narrowing.     IMPRESSION:  1. Multilevel spondylosis of the lumbar spine as described.  2. Mild foraminal narrowing bilaterally at L1-2 and L2-3 is worse on  the left. Slight retrolisthesis and uncovering of a broad-based disc  protrusion contribute at both levels.  3. Mild central and bilateral foraminal narrowing at L3-4 secondary  to a broad-based disc protrusion and facet hypertrophy.  4. Mild central and mild to moderate foraminal narrowing bilaterally  at L4-5 is worse right than left.  5. Moderate left and mild right foraminal stenosis at L5-S1 due to  endplate and facet spurring.  6. Cholelithiasis without evidence for cholecystitis.        Electronically Signed    By: Lonni Necessary M.D.    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   Bilateral inguinal hernia 09/28/2018   Incisional hernia 03/17/2018   Trigger thumb, left thumb 01/19/2018   Trigger finger, left index finger 07/18/2017   Trigger finger, right ring finger 01/31/2017   Trigger finger of left thumb 01/03/2017  Trigger finger of right thumb 01/03/2017   Trigger index finger of left hand 01/03/2017   Trigger index finger of right hand 01/03/2017   Malnutrition of moderate degree 07/19/2016   Intra-abdominal abscess (HCC) 07/16/2016   Cellulitis 07/15/2016   S/P colostomy takedown 06/28/2016   Pain in thoracic spine 06/09/2016   Mid back pain 06/09/2016   Postoperative fever 11/19/2015   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   Severe protein-calorie malnutrition 11/17/2015   Sepsis (HCC) 11/14/2015   Hiccups 11/14/2015   AKI (acute kidney injury) 11/14/2015   Fever    Leg swelling    Left shoulder pain    Muscle spasm of left shoulder    GI bleed 10/27/2015   Acute blood loss anemia 10/27/2015   Syncope 10/27/2015   Hyperglycemia 10/27/2015   Hypotension 10/27/2015   Neck pain 10/27/2015   Hematemesis 10/27/2015   Hematochezia 10/27/2015   Diverticulitis of colon with perforation 07/05/2015   Past Medical History:  Diagnosis Date   Anemia    Anxiety    BPH (benign prostatic hyperplasia)    Gastritis    GERD (gastroesophageal reflux disease)    seldom (07/16/2016)   GI bleed due to NSAIDs 10/27/2015   History of blood transfusion 06/2016   post OR/notes 07/15/2016   History of hiatal hernia    History of kidney stones    surgery to remove stone   Hyperlipidemia    Hypertension    no meds    Melanoma of back (HCC)    mid back   Sigmoid diverticulitis    with perforation     Family History  Problem Relation Age of Onset   Stroke Mother    Stroke Brother    Heart disease Brother     Past Surgical History:  Procedure Laterality Date   COLON SURGERY     sigmoid   COLOSTOMY TAKEDOWN N/A 06/28/2016   Procedure: COLOSTOMY TAKEDOWN;  Surgeon: Vicenta Poli, MD;  Location: [facility] OR;  Service: General;  Laterality: N/A;   CYSTOSCOPY WITH RETROGRADE PYELOGRAM, URETEROSCOPY AND STENT PLACEMENT Bilateral 02/12/2020   Procedure: CYSTOSCOPY WITH BILATERAL RETROGRADE PYELOGRAM,  AND LITHOPEXY;  Surgeon: Rosalind Zachary NOVAK, MD;  Location: [facility];  Service: Urology;  Laterality: Bilateral;  1 HR   ESOPHAGOGASTRODUODENOSCOPY N/A 10/27/2015   Procedure: ESOPHAGOGASTRODUODENOSCOPY (EGD);  Surgeon: Oliva Boots, MD;  Location[name] ENDOSCOPY;  Service: Endoscopy;  Laterality: N/A;   ESOPHAGOGASTRODUODENOSCOPY N/A 10/29/2015   Procedure: ESOPHAGOGASTRODUODENOSCOPY (EGD);  Surgeon: Lamar Bunk, MD;  Location: [facility] ENDOSCOPY;  Service: Endoscopy;  Laterality: N/A;   ESOPHAGOGASTRODUODENOSCOPY N/A 10/30/2015   Procedure: ESOPHAGOGASTRODUODENOSCOPY (EGD);  Surgeon: Oliva Boots, MD;  Location: [facility] ENDOSCOPY;  Service: Endoscopy;  Laterality: N/A;   g tube discontinued  04/2020   per patient   GASTROSTOMY TUBE PLACEMENT  11/21/2015   REDUCTION OF HIATAL HERNIA , REPAIR HIATAL HERNIA, RESECTION SMALL BOWEL WITH ANASTOMOSIS, PLACEMENT GASTROSTOMY TUBE, PLACEMENT DUODENOSTOMY TUBE (N/A)   HEMORRHOID BANDING  X 2   HERNIA REPAIR     HIATAL HERNIA REPAIR N/A 11/21/2015   Procedure: REDUCTION OF HIATAL HERNIA , REPAIR HIATAL HERNIA, RESECTION SMALL BOWEL WITH ANASTOMOSIS, PLACEMENT GASTROSTOMY TUBE, PLACEMENT DUODENOSTOMY TUBE;  Surgeon: Herlene Beverley Bureau, MD;  Location: [facility] OR;  Service: General;  Laterality: N/A;   HOLMIUM LASER APPLICATION Right 02/12/2020   Procedure: HOLMIUM LASER APPLICATION;  Surgeon: Rosalind Zachary NOVAK, MD;  Location: [facility];  Service: Urology;  Laterality: Right;    INCISIONAL HERNIA REPAIR  06/28/2016   open/notes  07/15/2016   INCISIONAL HERNIA REPAIR  03/17/2018   WITH MESH   INCISIONAL HERNIA REPAIR N/A 03/17/2018   Procedure: INCISIONAL HERNIA REPAIR WITH MESH;  Surgeon: [name], MD;  Location: [facility];  Service: General;  Laterality: N/A;   INCISIONAL HERNIA REPAIR N/A 10/16/2020   Procedure: INCISIONAL HERNIA REPAIR WITH MESH;  Surgeon: [name], MD;  Location: [facility];  Service: General;  Laterality: N/A;   INGUINAL HERNIA REPAIR Bilateral 09/28/2018   INGUINAL HERNIA REPAIR Bilateral 09/28/2018   Procedure: BILATERAL OPEN INGUINAL HERNIA REPAIR WITH MESH;  Surgeon: [name], MD;  Location: [facility];  Service: General;  Laterality: Bilateral;  GENERAL AND TAP BLOCK   INSERTION OF MESH N/A 03/17/2018   Procedure: INSERTION OF MESH;  Surgeon: [name], MD;  Location: [facility];  Service: General;  Laterality: N/A;   IR CM INJ ANY COLONIC TUBE W/FLUORO  02/04/2017   IR GUIDED DRAIN W CATHETER PLACEMENT  07/06/2016   /NOTES 07/15/2016   IR PATIENT EVAL TECH 0-60 MINS  06/28/2019   IR RADIOLOGIST EVAL & MGMT  07/27/2016   IR RADIOLOGIST EVAL & MGMT  08/17/2016   IR RADIOLOGIST EVAL & MGMT  08/26/2016   IR REPLACE G-TUBE SIMPLE WO FLUORO  07/28/2017   IR REPLACE G-TUBE SIMPLE WO FLUORO  01/31/2018   IR REPLACE G-TUBE SIMPLE WO FLUORO  10/16/2018   IR REPLACE G-TUBE SIMPLE WO FLUORO  03/05/2019   IR REPLACE G-TUBE SIMPLE WO FLUORO  08/22/2019   IR REPLACE G-TUBE SIMPLE WO FLUORO  01/08/2020   IR REPLC GASTRO/COLONIC TUBE PERCUT W/FLUORO  08/04/2016   IR REPLC GASTRO/COLONIC TUBE PERCUT W/FLUORO  01/14/2017   IR US  GUIDE BX ASP/DRAIN  08/04/2016   KNEE CARTILAGE SURGERY Right 1971   opened me up   LAPAROTOMY N/A 07/05/2015   Procedure: PARTIAL SIGMOID COLECTOMY AND COLOSTOMY;  Surgeon: Berg Vernetta, MD;  Location: [facility];  Service: General;  Laterality: N/A;   MELANOMA EXCISION  2001   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 Tay  Vaping Use   Vaping status: Never Used  Substance and Sexual Activity   Alcohol  use: No   Drug use: No   Sexual activity: Not Currently       "

## 2024-05-04 ENCOUNTER — Emergency Department (HOSPITAL_BASED_OUTPATIENT_CLINIC_OR_DEPARTMENT_OTHER)

## 2024-05-04 ENCOUNTER — Other Ambulatory Visit: Payer: Self-pay

## 2024-05-04 ENCOUNTER — Emergency Department (HOSPITAL_BASED_OUTPATIENT_CLINIC_OR_DEPARTMENT_OTHER)
Admission: EM | Admit: 2024-05-04 | Discharge: 2024-05-04 | Disposition: A | Discharge status: Home / Self Care | Source: Home / Self Care | Attending: Emergency Medicine | Admitting: Emergency Medicine

## 2024-05-04 ENCOUNTER — Encounter (HOSPITAL_BASED_OUTPATIENT_CLINIC_OR_DEPARTMENT_OTHER): Payer: Self-pay

## 2024-05-04 DIAGNOSIS — N210 Calculus in bladder: Secondary | ICD-10-CM

## 2024-05-04 DIAGNOSIS — R1032 Left lower quadrant pain: Secondary | ICD-10-CM

## 2024-05-04 DIAGNOSIS — K8020 Calculus of gallbladder without cholecystitis without obstruction: Secondary | ICD-10-CM

## 2024-05-04 LAB — COMPREHENSIVE METABOLIC PANEL WITH GFR
ALT: 10 U/L (ref 0–44)
AST: 15 U/L (ref 15–41)
Albumin: 4.1 g/dL (ref 3.5–5.0)
Alkaline Phosphatase: 83 U/L (ref 38–126)
Anion gap: 11 (ref 5–15)
BUN: 7 mg/dL — ABNORMAL LOW (ref 8–23)
CO2: 27 mmol/L (ref 22–32)
Calcium: 9 mg/dL (ref 8.9–10.3)
Chloride: 104 mmol/L (ref 98–111)
Creatinine, Ser: 0.81 mg/dL (ref 0.61–1.24)
GFR, Estimated: >60 mL/min
Glucose, Bld: 105 mg/dL — ABNORMAL HIGH (ref 70–99)
Potassium: 3.9 mmol/L (ref 3.5–5.1)
Sodium: 141 mmol/L (ref 135–145)
Total Bilirubin: <0.2 mg/dL (ref 0.0–1.2)
Total Protein: 6.4 g/dL — ABNORMAL LOW (ref 6.5–8.1)

## 2024-05-04 LAB — CBC WITH DIFFERENTIAL/PLATELET
Abs Immature Granulocytes: 0.03 10*3/uL (ref 0.00–0.07)
Basophils Absolute: 0 10*3/uL (ref 0.0–0.1)
Basophils Relative: 0 %
Eosinophils Absolute: 0.1 10*3/uL (ref 0.0–0.5)
Eosinophils Relative: 1 %
HCT: 35.8 % — ABNORMAL LOW (ref 39.0–52.0)
Hemoglobin: 12.1 g/dL — ABNORMAL LOW (ref 13.0–17.0)
Immature Granulocytes: 1 %
Lymphocytes Relative: 25 %
Lymphs Abs: 1.5 10*3/uL (ref 0.7–4.0)
MCH: 32 pg (ref 26.0–34.0)
MCHC: 33.8 g/dL (ref 30.0–36.0)
MCV: 94.7 fL (ref 80.0–100.0)
Monocytes Absolute: 0.4 10*3/uL (ref 0.1–1.0)
Monocytes Relative: 7 %
Neutro Abs: 3.9 10*3/uL (ref 1.7–7.7)
Neutrophils Relative %: 66 %
Platelets: 243 10*3/uL (ref 150–400)
RBC: 3.78 MIL/uL — ABNORMAL LOW (ref 4.22–5.81)
RDW: 13.2 % (ref 11.5–15.5)
WBC: 5.9 10*3/uL (ref 4.0–10.5)
nRBC: 0 % (ref 0.0–0.2)

## 2024-05-04 LAB — URINALYSIS, ROUTINE W REFLEX MICROSCOPIC
Bilirubin Urine: NEGATIVE
Glucose, UA: NEGATIVE mg/dL
Hgb urine dipstick: NEGATIVE
Ketones, ur: NEGATIVE mg/dL
Leukocytes,Ua: NEGATIVE
Nitrite: NEGATIVE
Protein, ur: NEGATIVE mg/dL
Specific Gravity, Urine: 1.007 (ref 1.005–1.030)
pH: 5.5 (ref 5.0–8.0)

## 2024-05-04 LAB — LIPASE, BLOOD: Lipase: 22 U/L (ref 11–51)

## 2024-05-04 MED ORDER — MORPHINE SULFATE (PF) 4 MG/ML IV SOLN
4.0000 mg | Freq: Once | INTRAVENOUS | Status: AC
  Administered 2024-05-04: 4 mg via INTRAVENOUS
  Filled 2024-05-04: qty 1

## 2024-05-04 MED ORDER — OXYCODONE-ACETAMINOPHEN 5-325 MG PO TABS: 1.0000 | ORAL_TABLET | Freq: Four times a day (QID) | ORAL | 0 refills | Status: AC | PRN

## 2024-05-04 MED ORDER — DOCUSATE SODIUM 100 MG PO CAPS: 100.0000 mg | ORAL_CAPSULE | Freq: Two times a day (BID) | ORAL | 0 refills | Status: AC

## 2024-05-04 MED ORDER — ONDANSETRON HCL 4 MG PO TABS: 4.0000 mg | ORAL_TABLET | Freq: Four times a day (QID) | ORAL | 0 refills | Status: AC

## 2024-05-04 MED ORDER — IOHEXOL 300 MG/ML  SOLN
80.0000 mL | Freq: Once | INTRAMUSCULAR | Status: AC | PRN
  Administered 2024-05-04: 80 mL via INTRAVENOUS

## 2024-05-04 NOTE — Discharge Instructions (Addendum)
 You were seen today for abdominal pain with many stones noted in your bladder as well as your gallbladder.  You will need to continue to follow-up with general surgery for the gallbladder as well as with [facility] for the stones in your bladder.  Recommending contact their office to get scheduled for an appointment.  In the interim sending in some medication for you to use for pain, using Tylenol  ideally as a first-line medication however if unable to resolve, I am sending in some Percocet for you to use as needed.  This does have Tylenol  as well, please do not exceed more than 4000 mg in a 24-hour period or 1000 mg per dose.  Additionally make sure you take the laxative with the narcotic medication if you are taking this to help avoid bowel obstruction.  Please ensure to follow-up with your urology as well as with general surgery.  Return to the ER for new or worsening symptoms.

## 2024-05-04 NOTE — ED Triage Notes (Signed)
 Arrives POV with complaints of worsening abdominal pain and low back pain x1 day.

## 2024-05-04 NOTE — ED Provider Notes (Cosign Needed)
 " [place] [facility] AT [facility] Provider Note   CSN: [id number] Arrival date & time: 05/04/24  1402     Patient presents with: Abdominal Pain and Back Pain   Miguel [name] is a 74 y.o. male.  Abdominal Pain Back Pain Associated symptoms: abdominal pain   Patient is a 74 year old male presenting ED today for concerns for left lower quadrant abdominal pain radiating to left flank and left testicle and down left leg that has been present for the last day and a half.  Previous medical history of Diverticulitis with perforation, GI bleed, GERD, HTN, anxiety.  He notes this pain feels very similar to previous kidney stones.  Describing as sharp.  Has had episodes of nausea without vomiting.  As well as noting to have urinary urgency and frequency.  Denies fever, headache, chest pain, shortness of breath, cough     Prior to Admission medications  Medication Sig Start Date End Date Taking? Authorizing Provider  docusate sodium  (COLACE) 100 MG capsule Take 1 capsule (100 mg total) by mouth every 12 (twelve) hours. 05/04/24  Yes Janayia Burggraf S, PA-C  ondansetron  (ZOFRAN ) 4 MG tablet Take 1 tablet (4 mg total) by mouth every 6 (six) hours. 05/04/24  Yes Mamoudou Mulvehill S, PA-C  oxyCODONE -acetaminophen  (PERCOCET/ROXICET) 5-325 MG tablet Take 1 tablet by mouth every 6 (six) hours as needed for severe pain (pain score 7-10). 05/04/24  Yes Renee Erb S, PA-C  acetaminophen  (TYLENOL ) 500 MG tablet Take 1 tablet (500 mg total) by mouth every 8 (eight) hours as needed for mild pain (for pain). Patient taking differently: Take 250-500 mg by mouth every 4 (four) hours as needed for mild pain (pain score 1-3) or fever. 12/05/15   [name], PA-C  AMITIZA 24 MCG capsule Take 24 mcg by mouth 2 (two) times daily with a meal. 09/15/23   [provider]  amLODipine  (NORVASC ) 5 MG tablet Take 5 mg by mouth daily. 12/23/22   [provider]  diclofenac sodium  (VOLTAREN) 1 % GEL Apply 2 g topically 3 (three) times daily as needed (joint pain).     [provider]  finasteride  (PROSCAR ) 5 MG tablet Take 5 mg by mouth daily.    [provider]  gabapentin  (NEURONTIN ) 100 MG capsule Take 1 capsule by mouth 3 (three) times daily. 07/06/23   [provider]  LORazepam  (ATIVAN ) 1 MG tablet Take 0.5 mg by mouth daily as needed for anxiety. 05/29/18   [provider]  meloxicam (MOBIC) 15 MG tablet Take 15 mg by mouth daily as needed.    [provider]  methocarbamol  (ROBAXIN ) 500 MG tablet Take 250-500 mg by mouth every 8 (eight) hours as needed for muscle spasms.  07/11/18   [provider]  Multiple Vitamin (MULTIVITAMIN WITH MINERALS) TABS tablet Take 1 tablet by mouth daily.    [provider]  olmesartan (BENICAR) 20 MG tablet Take 20 mg by mouth daily. 10/27/23   [provider]  oxyCODONE  (OXY IR/ROXICODONE ) 5 MG immediate release tablet Take 1 tablet (5 mg total) by mouth every 6 (six) hours as needed for moderate pain, severe pain or breakthrough pain. 10/20/20   [name], MD  pantoprazole  (PROTONIX ) 20 MG tablet Take 20 mg by mouth daily. 01/18/18   [provider]  polyethylene glycol (MIRALAX / GLYCOLAX) 17 g packet Take 17 g by mouth daily as needed for mild constipation.    [provider]  Probiotic  Product (PROBIOTIC DAILY PO) Take 1 capsule by mouth daily.    [provider]  senna (SENOKOT) 8.6 MG tablet Take 2 tablets by mouth at bedtime.    [provider]  tamsulosin  (FLOMAX ) 0.4 MG CAPS capsule Take 1 capsule (0.4 mg total) by mouth daily. 01/04/16   Gail Favorite, MD  traMADol  (ULTRAM ) 50 MG tablet Take 50 mg by mouth every 6 (six) hours as needed for moderate pain. 05/21/16   [provider]  vitamin B-12 (CYANOCOBALAMIN ) 1000 MCG tablet Take 1,000 mcg by mouth daily.     [provider]  zolpidem  (AMBIEN ) 10 MG  tablet Take 10 mg by mouth at bedtime.    [provider]    Allergies: Patient has no known allergies.    Review of Systems  Gastrointestinal:  Positive for abdominal pain.  Genitourinary:  Positive for flank pain.  Musculoskeletal:  Positive for back pain.  All other systems reviewed and are negative.   Updated Vital Signs BP (!) 175/90   Pulse 84   Temp 98.6 F (37 C) (Oral)   Resp 16   Ht 5' 6 (1.676 m)   Wt 61.6 kg   SpO2 97%   BMI 21.92 kg/m   Physical Exam Vitals and nursing note reviewed. Exam conducted with a chaperone present.  Constitutional:      General: He is not in acute distress.    Appearance: Normal appearance. He is not ill-appearing or diaphoretic.  HENT:     Head: Normocephalic and atraumatic.  Eyes:     General: No scleral icterus.       Right eye: No discharge.        Left eye: No discharge.     Extraocular Movements: Extraocular movements intact.     Conjunctiva/sclera: Conjunctivae normal.  Cardiovascular:     Rate and Rhythm: Normal rate and regular rhythm.     Pulses: Normal pulses.     Heart sounds: Normal heart sounds. No murmur heard.    No friction rub. No gallop.  Pulmonary:     Effort: Pulmonary effort is normal. No respiratory distress.     Breath sounds: No stridor. No wheezing, rhonchi or rales.  Chest:     Chest wall: No tenderness.  Abdominal:     General: Abdomen is flat. There is no distension.     Palpations: Abdomen is soft.     Tenderness: There is abdominal tenderness in the suprapubic area and left lower quadrant. There is left CVA tenderness. There is no right CVA tenderness, guarding or rebound. Negative signs include Murphy's sign and McBurney's sign.  Genitourinary:    Testes:        Right: Mass, tenderness or swelling not present.        Left: Tenderness present. Mass or swelling not present.  Musculoskeletal:        General: No swelling, deformity or signs of injury.     Cervical back: Normal range  of motion. No rigidity.     Right lower leg: No edema.     Left lower leg: No edema.  Skin:    General: Skin is warm and dry.     Findings: No bruising, erythema or lesion.  Neurological:     General: No focal deficit present.     Mental Status: He is alert and oriented to person, place, and time. Mental status is at baseline.     Sensory: No sensory deficit.     Motor: No weakness.  Psychiatric:        Mood and Affect: Mood normal.     (all labs ordered are listed, but only abnormal results are displayed) Labs Reviewed  CBC WITH DIFFERENTIAL/PLATELET - Abnormal; Notable for the following components:      Result Value   RBC 3.78 (*)    Hemoglobin 12.1 (*)    HCT 35.8 (*)    All other components within normal limits  COMPREHENSIVE METABOLIC PANEL WITH GFR - Abnormal; Notable for the following components:   Glucose, Bld 105 (*)    BUN 7 (*)    Total Protein 6.4 (*)    All other components within normal limits  URINE CULTURE  LIPASE, BLOOD  URINALYSIS, ROUTINE W REFLEX MICROSCOPIC    EKG: None  Radiology: CT ABDOMEN PELVIS W CONTRAST Result Date: 05/04/2024 CLINICAL DATA:  Left lower quadrant pain EXAM: CT ABDOMEN AND PELVIS WITH CONTRAST TECHNIQUE: Multidetector CT imaging of the abdomen and pelvis was performed using the standard protocol following bolus administration of intravenous contrast. RADIATION DOSE REDUCTION: This exam was performed according to the departmental dose-optimization program which includes automated exposure control, adjustment of the mA and/or kV according to patient size and/or use of iterative reconstruction technique. CONTRAST:  80mL OMNIPAQUE  IOHEXOL  300 MG/ML  SOLN COMPARISON:  MRI 11/12/2023, CT 11/12/2023 FINDINGS: Lower chest: Lung bases demonstrate no acute airspace disease. 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 Hepatobiliary: Gallstones. Gallbladder slightly distended. There is mild intra and extrahepatic biliary dilatation.  Subcentimeter hypodensity within the inferior right hepatic lobe too small to further characterize Pancreas: Slightly atrophic. No definite acute inflammatory process. Focal hyperdensity in the region of inferior pancreatic head/uncinate process measuring 7 mm on series 2, image 31 with possible surrounding low-density area, measuring 14 mm in aggregate. Spleen: Normal in size without focal abnormality. Adrenals/Urinary Tract: Adrenal glands are stable in appearance with thickening but no dominant mass. Prominence of the left renal pelvis as before. Cystic dilatation of the distal ureters proximal to the UVJ. Small nonobstructing stones within the enlarged ureters measuring 3 mm on the right, series 2, image 62 and 2 mm on the left, series 2, image 61. 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 No obstruction. Postsurgical changes of the sigmoid colon. No acute bowel wall thickening. Vascular/Lymphatic: Aortic atherosclerosis. No enlarged abdominal or pelvic lymph nodes. Reproductive: Enlarged prostate Other: No ascites or free air. Musculoskeletal: No acute or suspicious osseous abnormality. IMPRESSION: 1. No CT evidence for acute intra-abdominal or pelvic abnormality. 2. Gallstones. Mild intra and extrahepatic biliary dilatation. Correlate with LFTs 3. Focal hyperdensity in the region of the inferior pancreatic head/uncinate process with possible surrounding low-density area, measuring 14 mm in aggregate. Uncertain if this represents developing calcific change from prior pancreatitis versus small partially calcific and cystic lesion. When the patient is clinically stable and able to follow directions and hold their breath (preferably as an outpatient) further evaluation with dedicated abdominal MRI should be considered. 4. 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 5. Enlarged prostate. 6. Aortic atherosclerosis. Aortic Atherosclerosis (ICD10-I70.0). Electronically Signed   By: Luke Bun M.D.   On: 05/04/2024 18:03    Procedures   Medications Ordered in the ED  morphine  (PF) 4 MG/ML injection 4 mg (has no administration in time range)  morphine  (PF) 4 MG/ML injection 4 mg (4 mg Intravenous Given 05/04/24 1603)  iohexol  (OMNIPAQUE )  300 MG/ML solution 80 mL (80 mLs Intravenous Contrast Given 05/04/24 1715)   Medical Decision Making Amount and/or Complexity of Data Reviewed Labs: ordered. Radiology: ordered.  Risk Prescription drug management.  This patient is a 74 year old male who presents to the ED for concern of left lower quad abdominal pain radiating to left flank, left testes and down left leg intermittent x 2 days.  Notably feels similar to previous kidney stones.  On physical exam, patient is in no acute distress, afebrile, alert and orient x 4, speaking in full sentences, nontachypneic, nontachycardic.  LCTAB, RRR, no murmur, no lower leg edema.  Notably does have some mild left lower quadrant tenderness as well as some mild left testicular tenderness to palpation, with no swelling.  Overall very well-appearing.  CT scan was done which did show chronic pancreatic findings which will need further outpatient follow-up as well as many stones in the bladder and gallstones.  Low suspicion for cholecystitis at this time with patient having no upper abdominal pain and negative Murphy sign.  Suspecting likely stone as cause of patient symptoms today.  Send home with pain management noncontrolled however follow-up with [facility] which he is already scheduled with.  Case discussed with attending who agrees with plan.  Patient vital signs have remained stable throughout the course of patient's time in the ED. Low suspicion for any other emergent pathology at this time. I believe this patient is safe to be discharged. Provided strict  return to ER precautions. Patient expressed agreement and understanding of plan. All questions were answered.  Differential diagnoses prior to evaluation: The emergent differential diagnosis includes, but is not limited to, diverticulitis, pancreatitis, cholecystitis, appendicitis, nephrolithiasis, orchitis. This is not an exhaustive differential.   Past Medical History / Co-morbidities / Social History: Diverticulitis with perforation, GI bleed, GERD, HTN, anxiety,  Additional history: Chart reviewed. Pertinent results include:   Spoke on the phone with PCP today for medication refills.  Last seen by [facility] on 02/27/2024.  Lab Tests/Imaging studies: I personally interpreted labs/imaging and the pertinent results include: CBC notes hemoglobin 12.1, improved from previous CMP unremarkable UA unremarkable Lipase unremarkable  CT abdomen notes gallstones with extrahepatic biliary dilation, focal hyperdensity along the inferior pancreatic head.  Chronic cystic dilation with distal ureter proximal to the UVJ and small nonobstructing stones with enlarged ureters, I agree with the radiologist interpretation.    Medications: I ordered medication including morphine , Percocet, Zofran .  I have reviewed the patients home medicines and have made adjustments as needed.  Critical Interventions: None  Social Determinants of Health: Is already followed by [facility] and has good follow-up  Disposition: After consideration of the diagnostic results and the patients response to treatment, I feel that the patient would benefit from discharge shortness of breath.   emergency department workup does not suggest an emergent condition requiring admission or immediate intervention beyond what has been performed at this time. The plan is: Follow-up with general surgery, follow-up with PCP, follow-up with urology, symptomatic management home, return for new or worsening symptoms. The patient is safe  for discharge and has been instructed to return immediately for worsening symptoms, change in symptoms or any other concerns.  Final diagnoses:  Bladder stones  Gallstones  LLQ abdominal pain    ED Discharge Orders          Ordered    oxyCODONE -acetaminophen  (PERCOCET/ROXICET) 5-325 MG tablet  Every 6 hours PRN        05/04/24 1914  ondansetron  (ZOFRAN ) 4 MG tablet  Every 6 hours        05/04/24 1914    docusate sodium  (COLACE) 100 MG capsule  Every 12 hours        05/04/24 1914               Beola Terrall RAMAN, [place] 05/04/24 1923  "

## 2024-05-08 ENCOUNTER — Ambulatory Visit: Admitting: Physician Assistant

## 2024-05-22 ENCOUNTER — Ambulatory Visit: Admitting: Physician Assistant
